# Patient Record
Sex: Female | Born: 1960
Health system: Southern US, Community
[De-identification: ages and names within clinical notes are randomized; demographics above are authoritative.]

## PROBLEM LIST (undated history)

## (undated) DIAGNOSIS — E7800 Pure hypercholesterolemia, unspecified: Secondary | ICD-10-CM

## (undated) DIAGNOSIS — I10 Essential (primary) hypertension: Secondary | ICD-10-CM

## (undated) DIAGNOSIS — F419 Anxiety disorder, unspecified: Secondary | ICD-10-CM

## (undated) DIAGNOSIS — E039 Hypothyroidism, unspecified: Secondary | ICD-10-CM

## (undated) DIAGNOSIS — J45909 Unspecified asthma, uncomplicated: Secondary | ICD-10-CM

## (undated) DIAGNOSIS — D472 Monoclonal gammopathy: Secondary | ICD-10-CM

## (undated) DIAGNOSIS — G4730 Sleep apnea, unspecified: Secondary | ICD-10-CM

## (undated) DIAGNOSIS — R112 Nausea with vomiting, unspecified: Secondary | ICD-10-CM

## (undated) DIAGNOSIS — C8800 Waldenstrom macroglobulinemia not having achieved remission: Secondary | ICD-10-CM

## (undated) DIAGNOSIS — Z9889 Other specified postprocedural states: Secondary | ICD-10-CM

## (undated) DIAGNOSIS — R002 Palpitations: Secondary | ICD-10-CM

## (undated) DIAGNOSIS — E669 Obesity, unspecified: Secondary | ICD-10-CM

## (undated) DIAGNOSIS — E119 Type 2 diabetes mellitus without complications: Secondary | ICD-10-CM

## (undated) DIAGNOSIS — C9000 Multiple myeloma not having achieved remission: Secondary | ICD-10-CM

## (undated) DIAGNOSIS — G629 Polyneuropathy, unspecified: Secondary | ICD-10-CM

## (undated) DIAGNOSIS — R300 Dysuria: Secondary | ICD-10-CM

## (undated) DIAGNOSIS — F32A Depression, unspecified: Secondary | ICD-10-CM

## (undated) DIAGNOSIS — R239 Unspecified skin changes: Secondary | ICD-10-CM

## (undated) DIAGNOSIS — K8020 Calculus of gallbladder without cholecystitis without obstruction: Secondary | ICD-10-CM

## (undated) DIAGNOSIS — D649 Anemia, unspecified: Secondary | ICD-10-CM

## (undated) DIAGNOSIS — E8809 Other disorders of plasma-protein metabolism, not elsewhere classified: Secondary | ICD-10-CM

## (undated) DIAGNOSIS — C801 Malignant (primary) neoplasm, unspecified: Secondary | ICD-10-CM

## (undated) DIAGNOSIS — K219 Gastro-esophageal reflux disease without esophagitis: Secondary | ICD-10-CM

## (undated) DIAGNOSIS — F329 Major depressive disorder, single episode, unspecified: Secondary | ICD-10-CM

## (undated) HISTORY — DX: Unspecified skin changes: R23.9

## (undated) HISTORY — PX: CHOLECYSTECTOMY: SHX55

## (undated) HISTORY — DX: Anemia, unspecified: D64.9

## (undated) HISTORY — DX: Obesity, unspecified: E66.9

## (undated) HISTORY — DX: Palpitations: R00.2

## (undated) HISTORY — PX: BONE MARROW BIOPSY: SHX1253

## (undated) HISTORY — DX: Monoclonal gammopathy: D47.2

## (undated) HISTORY — DX: Multiple myeloma not having achieved remission: C90.00

## (undated) HISTORY — DX: Polyneuropathy, unspecified: G62.9

## (undated) HISTORY — DX: Calculus of gallbladder without cholecystitis without obstruction: K80.20

## (undated) HISTORY — DX: Pure hypercholesterolemia, unspecified: E78.00

## (undated) HISTORY — DX: Type 2 diabetes mellitus without complications: E11.9

## (undated) HISTORY — DX: Other disorders of plasma-protein metabolism, not elsewhere classified: E88.09

## (undated) HISTORY — PX: OTHER SURGICAL HISTORY: SHX169

## (undated) HISTORY — DX: Waldenstrom macroglobulinemia not having achieved remission: C88.00

## (undated) HISTORY — PX: UTERINE FIBROID EMBOLIZATION: SHX825

## (undated) HISTORY — DX: Polyneuropathy, unspecified: D47.2

## (undated) HISTORY — DX: Gastro-esophageal reflux disease without esophagitis: K21.9

## (undated) HISTORY — DX: Dysuria: R30.0

## (undated) HISTORY — PX: KNEE ARTHROSCOPY W/ MENISCAL REPAIR: SHX1877

## (undated) HISTORY — DX: Waldenstrom macroglobulinemia: C88.0

---

## 1995-01-09 HISTORY — PX: LAPAROSCOPIC CHOLECYSTECTOMY: SUR755

## 1996-01-09 HISTORY — PX: FOOT SURGERY: SHX648

## 1998-09-28 ENCOUNTER — Other Ambulatory Visit: Admission: RE | Admit: 1998-09-28 | Discharge: 1998-09-28 | Payer: Self-pay | Admitting: Obstetrics and Gynecology

## 1999-10-18 ENCOUNTER — Other Ambulatory Visit: Admission: RE | Admit: 1999-10-18 | Discharge: 1999-10-18 | Payer: Self-pay | Admitting: Obstetrics and Gynecology

## 2000-12-11 ENCOUNTER — Other Ambulatory Visit: Admission: RE | Admit: 2000-12-11 | Discharge: 2000-12-11 | Payer: Self-pay | Admitting: Obstetrics and Gynecology

## 2001-12-12 ENCOUNTER — Other Ambulatory Visit: Admission: RE | Admit: 2001-12-12 | Discharge: 2001-12-12 | Payer: Self-pay | Admitting: Obstetrics and Gynecology

## 2002-01-08 HISTORY — PX: NASAL SINUS SURGERY: SHX719

## 2003-01-15 ENCOUNTER — Other Ambulatory Visit: Admission: RE | Admit: 2003-01-15 | Discharge: 2003-01-15 | Payer: Self-pay | Admitting: Obstetrics and Gynecology

## 2004-02-08 ENCOUNTER — Encounter: Admission: RE | Admit: 2004-02-08 | Discharge: 2004-02-08 | Payer: Self-pay

## 2004-02-27 ENCOUNTER — Encounter: Admission: RE | Admit: 2004-02-27 | Discharge: 2004-02-27 | Payer: Self-pay | Admitting: Unknown Physician Specialty

## 2004-03-08 HISTORY — PX: LUMBAR DISC SURGERY: SHX700

## 2004-03-08 HISTORY — PX: BACK SURGERY: SHX140

## 2004-03-31 ENCOUNTER — Ambulatory Visit (HOSPITAL_COMMUNITY): Admission: RE | Admit: 2004-03-31 | Discharge: 2004-04-01 | Payer: Self-pay | Admitting: Neurosurgery

## 2005-01-23 ENCOUNTER — Other Ambulatory Visit: Admission: RE | Admit: 2005-01-23 | Discharge: 2005-01-23 | Payer: Self-pay | Admitting: Obstetrics & Gynecology

## 2005-02-19 ENCOUNTER — Ambulatory Visit (HOSPITAL_BASED_OUTPATIENT_CLINIC_OR_DEPARTMENT_OTHER): Admission: RE | Admit: 2005-02-19 | Discharge: 2005-02-19 | Payer: Self-pay | Admitting: Unknown Physician Specialty

## 2005-02-25 ENCOUNTER — Ambulatory Visit: Payer: Self-pay | Admitting: Internal Medicine

## 2006-07-01 ENCOUNTER — Other Ambulatory Visit: Admission: RE | Admit: 2006-07-01 | Discharge: 2006-07-01 | Payer: Self-pay | Admitting: Obstetrics & Gynecology

## 2006-08-14 ENCOUNTER — Encounter: Admission: RE | Admit: 2006-08-14 | Discharge: 2006-08-14 | Payer: Self-pay | Admitting: Family Medicine

## 2007-01-09 HISTORY — PX: UTERINE FIBROID EMBOLIZATION: SHX825

## 2007-07-03 ENCOUNTER — Other Ambulatory Visit: Admission: RE | Admit: 2007-07-03 | Discharge: 2007-07-03 | Payer: Self-pay | Admitting: Obstetrics & Gynecology

## 2007-09-09 HISTORY — PX: ABLATION: SHX5711

## 2007-09-29 ENCOUNTER — Ambulatory Visit (HOSPITAL_BASED_OUTPATIENT_CLINIC_OR_DEPARTMENT_OTHER): Admission: RE | Admit: 2007-09-29 | Discharge: 2007-09-29 | Payer: Self-pay | Admitting: Internal Medicine

## 2007-09-29 ENCOUNTER — Encounter: Payer: Self-pay | Admitting: Obstetrics & Gynecology

## 2008-08-05 ENCOUNTER — Encounter: Admission: RE | Admit: 2008-08-05 | Discharge: 2008-08-05 | Payer: Self-pay | Admitting: Gastroenterology

## 2008-09-10 ENCOUNTER — Ambulatory Visit: Payer: Self-pay | Admitting: Internal Medicine

## 2008-09-14 LAB — CBC WITH DIFFERENTIAL/PLATELET
Basophils Absolute: 0 10*3/uL (ref 0.0–0.1)
HCT: 36 % (ref 34.8–46.6)
HGB: 12.7 g/dL (ref 11.6–15.9)
MONO#: 0.4 10*3/uL (ref 0.1–0.9)
NEUT#: 4.9 10*3/uL (ref 1.5–6.5)
NEUT%: 73.3 % (ref 38.4–76.8)
WBC: 6.7 10*3/uL (ref 3.9–10.3)
lymph#: 1.3 10*3/uL (ref 0.9–3.3)

## 2008-09-16 LAB — IMMUNOFIXATION ELECTROPHORESIS
IgA: 132 mg/dL (ref 68–378)
IgG (Immunoglobin G), Serum: 617 mg/dL — ABNORMAL LOW (ref 694–1618)

## 2008-09-16 LAB — KAPPA/LAMBDA LIGHT CHAINS: Kappa free light chain: <0.57 mg/dL (ref 0.33–1.94)

## 2008-09-16 LAB — COMPREHENSIVE METABOLIC PANEL
ALT: 80 U/L — ABNORMAL HIGH (ref 0–35)
Albumin: 4.1 g/dL (ref 3.5–5.2)
BUN: 9 mg/dL (ref 6–23)
CO2: 20 mEq/L (ref 19–32)
Calcium: 8.9 mg/dL (ref 8.4–10.5)
Chloride: 101 mEq/L (ref 96–112)
Creatinine, Ser: 0.63 mg/dL (ref 0.40–1.20)

## 2008-09-16 LAB — LACTATE DEHYDROGENASE: LDH: 165 U/L (ref 94–250)

## 2008-09-20 LAB — UIFE/LIGHT CHAINS/TP QN, 24-HR UR
Albumin, U: DETECTED
Free Lambda Lt Chains,Ur: <0.05 mg/dL (ref 0.08–1.01)
Free Lt Chn Excr Rate: 5.53 mg/d
Time: 24 hours
Volume, Urine: 4610 mL

## 2008-09-27 ENCOUNTER — Ambulatory Visit (HOSPITAL_COMMUNITY): Admission: RE | Admit: 2008-09-27 | Discharge: 2008-09-27 | Payer: Self-pay | Admitting: Internal Medicine

## 2008-10-05 ENCOUNTER — Ambulatory Visit (HOSPITAL_COMMUNITY): Admission: RE | Admit: 2008-10-05 | Discharge: 2008-10-05 | Payer: Self-pay | Admitting: Internal Medicine

## 2008-10-05 ENCOUNTER — Encounter (INDEPENDENT_AMBULATORY_CARE_PROVIDER_SITE_OTHER): Payer: Self-pay | Admitting: Interventional Radiology

## 2009-01-05 ENCOUNTER — Ambulatory Visit: Payer: Self-pay | Admitting: Internal Medicine

## 2009-01-10 LAB — CBC WITH DIFFERENTIAL/PLATELET
Basophils Absolute: 0 10*3/uL (ref 0.0–0.1)
Eosinophils Absolute: 0.1 10*3/uL (ref 0.0–0.5)
HCT: 36.5 % (ref 34.8–46.6)
LYMPH%: 21.1 % (ref 14.0–49.7)
MCV: 94.6 fL (ref 79.5–101.0)
MONO#: 0.5 10*3/uL (ref 0.1–0.9)
MONO%: 6.5 % (ref 0.0–14.0)
NEUT#: 4.8 10*3/uL (ref 1.5–6.5)
NEUT%: 69.9 % (ref 38.4–76.8)
Platelets: 230 10*3/uL (ref 145–400)
RBC: 3.85 10*6/uL (ref 3.70–5.45)

## 2009-01-10 LAB — COMPREHENSIVE METABOLIC PANEL
Alkaline Phosphatase: 85 U/L (ref 39–117)
BUN: 8 mg/dL (ref 6–23)
CO2: 30 mEq/L (ref 19–32)
Creatinine, Ser: 0.71 mg/dL (ref 0.40–1.20)
Glucose, Bld: 125 mg/dL — ABNORMAL HIGH (ref 70–99)
Sodium: 138 mEq/L (ref 135–145)
Total Bilirubin: 0.7 mg/dL (ref 0.3–1.2)

## 2009-01-10 LAB — LACTATE DEHYDROGENASE: LDH: 128 U/L (ref 94–250)

## 2009-01-11 LAB — IGG, IGA, IGM
IgA: 128 mg/dL (ref 68–378)
IgG (Immunoglobin G), Serum: 639 mg/dL — ABNORMAL LOW (ref 694–1618)

## 2009-01-11 LAB — KAPPA/LAMBDA LIGHT CHAINS: Kappa free light chain: 0.97 mg/dL (ref 0.33–1.94)

## 2009-01-11 LAB — BETA 2 MICROGLOBULIN, SERUM: Beta-2 Microglobulin: 1.29 mg/L (ref 1.01–1.73)

## 2009-03-03 ENCOUNTER — Ambulatory Visit (HOSPITAL_BASED_OUTPATIENT_CLINIC_OR_DEPARTMENT_OTHER): Admission: RE | Admit: 2009-03-03 | Discharge: 2009-03-03 | Payer: Self-pay | Admitting: Orthopedic Surgery

## 2009-04-04 ENCOUNTER — Ambulatory Visit: Payer: Self-pay | Admitting: Internal Medicine

## 2009-04-06 LAB — CBC WITH DIFFERENTIAL/PLATELET
Basophils Absolute: 0 10*3/uL (ref 0.0–0.1)
EOS%: 1.6 % (ref 0.0–7.0)
Eosinophils Absolute: 0.1 10*3/uL (ref 0.0–0.5)
HGB: 13.4 g/dL (ref 11.6–15.9)
MCH: 33 pg (ref 25.1–34.0)
MONO#: 0.6 10*3/uL (ref 0.1–0.9)
NEUT#: 6.1 10*3/uL (ref 1.5–6.5)
RDW: 13.3 % (ref 11.2–14.5)
WBC: 8.3 10*3/uL (ref 3.9–10.3)
lymph#: 1.5 10*3/uL (ref 0.9–3.3)

## 2009-04-08 LAB — IGG, IGA, IGM: IgA: 149 mg/dL (ref 68–378)

## 2009-04-08 LAB — COMPREHENSIVE METABOLIC PANEL
ALT: 50 U/L — ABNORMAL HIGH (ref 0–35)
AST: 48 U/L — ABNORMAL HIGH (ref 0–37)
Albumin: 4.4 g/dL (ref 3.5–5.2)
BUN: 11 mg/dL (ref 6–23)
CO2: 28 mEq/L (ref 19–32)
Calcium: 9.7 mg/dL (ref 8.4–10.5)
Chloride: 99 mEq/L (ref 96–112)
Potassium: 4.1 mEq/L (ref 3.5–5.3)

## 2009-04-08 LAB — KAPPA/LAMBDA LIGHT CHAINS: Lambda Free Lght Chn: 80.2 mg/dL — ABNORMAL HIGH (ref 0.57–2.63)

## 2009-07-04 ENCOUNTER — Ambulatory Visit: Payer: Self-pay | Admitting: Internal Medicine

## 2009-07-06 LAB — CBC WITH DIFFERENTIAL/PLATELET
BASO%: 0.6 % (ref 0.0–2.0)
Eosinophils Absolute: 0.1 10*3/uL (ref 0.0–0.5)
HCT: 36.3 % (ref 34.8–46.6)
MCHC: 34.8 g/dL (ref 31.5–36.0)
MONO#: 0.7 10*3/uL (ref 0.1–0.9)
NEUT#: 5.8 10*3/uL (ref 1.5–6.5)
Platelets: 235 10*3/uL (ref 145–400)
RBC: 3.91 10*6/uL (ref 3.70–5.45)
WBC: 8.4 10*3/uL (ref 3.9–10.3)
lymph#: 1.8 10*3/uL (ref 0.9–3.3)

## 2009-07-06 LAB — COMPREHENSIVE METABOLIC PANEL
ALT: 48 U/L — ABNORMAL HIGH (ref 0–35)
Albumin: 3.9 g/dL (ref 3.5–5.2)
CO2: 27 mEq/L (ref 19–32)
Calcium: 9.2 mg/dL (ref 8.4–10.5)
Chloride: 101 mEq/L (ref 96–112)
Glucose, Bld: 76 mg/dL (ref 70–99)
Sodium: 136 mEq/L (ref 135–145)
Total Protein: 7.2 g/dL (ref 6.0–8.3)

## 2009-07-06 LAB — LACTATE DEHYDROGENASE: LDH: 131 U/L (ref 94–250)

## 2009-07-08 LAB — KAPPA/LAMBDA LIGHT CHAINS: Lambda Free Lght Chn: 46.3 mg/dL — ABNORMAL HIGH (ref 0.57–2.63)

## 2009-07-08 LAB — IGG, IGA, IGM: IgG (Immunoglobin G), Serum: 654 mg/dL — ABNORMAL LOW (ref 694–1618)

## 2009-07-13 LAB — UIFE/LIGHT CHAINS/TP QN, 24-HR UR
Albumin, U: DETECTED
Alpha 1, Urine: NOT DETECTED
Free Lambda Excretion/Day: 2.25 mg/d
Free Lambda Lt Chains,Ur: 0.05 mg/dL — ABNORMAL LOW (ref 0.08–1.01)
Total Protein, Urine-Ur/day: 27 mg/d (ref 10–140)
Volume, Urine: 4500 mL

## 2010-01-04 ENCOUNTER — Ambulatory Visit: Payer: Self-pay | Admitting: Internal Medicine

## 2010-03-22 ENCOUNTER — Encounter (HOSPITAL_BASED_OUTPATIENT_CLINIC_OR_DEPARTMENT_OTHER): Payer: BC Managed Care – PPO | Admitting: Internal Medicine

## 2010-03-22 ENCOUNTER — Other Ambulatory Visit: Payer: Self-pay | Admitting: Internal Medicine

## 2010-03-22 DIAGNOSIS — C880 Waldenstrom macroglobulinemia: Secondary | ICD-10-CM

## 2010-03-22 DIAGNOSIS — D472 Monoclonal gammopathy: Secondary | ICD-10-CM

## 2010-03-22 LAB — COMPREHENSIVE METABOLIC PANEL
Albumin: 3.8 g/dL (ref 3.5–5.2)
BUN: 8 mg/dL (ref 6–23)
CO2: 28 mEq/L (ref 19–32)
Calcium: 9.1 mg/dL (ref 8.4–10.5)
Chloride: 101 mEq/L (ref 96–112)
Glucose, Bld: 100 mg/dL — ABNORMAL HIGH (ref 70–99)
Potassium: 3.6 mEq/L (ref 3.5–5.3)

## 2010-03-22 LAB — CBC WITH DIFFERENTIAL/PLATELET
Basophils Absolute: 0 10*3/uL (ref 0.0–0.1)
Eosinophils Absolute: 0.1 10*3/uL (ref 0.0–0.5)
HGB: 12.7 g/dL (ref 11.6–15.9)
NEUT#: 4.7 10*3/uL (ref 1.5–6.5)
RDW: 12.6 % (ref 11.2–14.5)
lymph#: 1.4 10*3/uL (ref 0.9–3.3)

## 2010-03-24 LAB — IGG, IGA, IGM
IgA: 128 mg/dL (ref 68–378)
IgG (Immunoglobin G), Serum: 662 mg/dL — ABNORMAL LOW (ref 694–1618)
IgM, Serum: 692 mg/dL — ABNORMAL HIGH (ref 60–263)

## 2010-03-24 LAB — KAPPA/LAMBDA LIGHT CHAINS: Kappa free light chain: <0.6 mg/dL (ref 0.33–1.94)

## 2010-03-29 ENCOUNTER — Encounter (HOSPITAL_BASED_OUTPATIENT_CLINIC_OR_DEPARTMENT_OTHER): Payer: BC Managed Care – PPO | Admitting: Internal Medicine

## 2010-03-29 DIAGNOSIS — C880 Waldenstrom macroglobulinemia: Secondary | ICD-10-CM

## 2010-03-29 LAB — POCT PREGNANCY, URINE: Preg Test, Ur: NEGATIVE

## 2010-03-29 LAB — POCT I-STAT 4, (NA,K, GLUC, HGB,HCT)
Glucose, Bld: 108 mg/dL — ABNORMAL HIGH (ref 70–99)
HCT: 36 % (ref 36.0–46.0)
Hemoglobin: 12.2 g/dL (ref 12.0–15.0)
Sodium: 138 mEq/L (ref 135–145)

## 2010-04-14 LAB — PROTIME-INR: Prothrombin Time: 12 seconds (ref 11.6–15.2)

## 2010-04-14 LAB — BASIC METABOLIC PANEL
BUN: 8 mg/dL (ref 6–23)
Calcium: 9.1 mg/dL (ref 8.4–10.5)
Creatinine, Ser: 0.7 mg/dL (ref 0.4–1.2)
GFR calc non Af Amer: >60 mL/min (ref >60)
Glucose, Bld: 131 mg/dL — ABNORMAL HIGH (ref 70–99)

## 2010-04-14 LAB — CBC
Hemoglobin: 12.6 g/dL (ref 12.0–15.0)
MCHC: 35.1 g/dL (ref 30.0–36.0)
MCV: 92.4 fL (ref 78.0–100.0)
RBC: 3.9 MIL/uL (ref 3.87–5.11)

## 2010-04-14 LAB — CHROMOSOME ANALYSIS, BONE MARROW

## 2010-05-23 NOTE — Op Note (Signed)
NAMEKYNEDI, PROFITT              ACCOUNT NO.:  [id number]   MEDICAL RECORD NO.:  [id number]          PATIENT TYPE:  AMB   LOCATION:  [facility]                         FACILITY:  [facility]   PHYSICIAN:  M. Leda Quail, MD  DATE OF BIRTH:  Jan 19, 1960   DATE OF PROCEDURE:  09/29/2007  DATE OF DISCHARGE:                               OPERATIVE REPORT   PREOPERATIVE DIAGNOSES:  95. A 50 year old G0 single white female with menorrhagia.  2. Hypertension.  3. Asthma.  4. History of depression.  5. Seasonal allergies.  6. Obesity.   POSTOPERATIVE DIAGNOSES:  16. A 50 year old G0 single white female with menorrhagia.  2. Hypertension.  3. Asthma.  4. History of depression.  5. Seasonal allergies.  6. Obesity.  7. Endometrial polyps.   PROCEDURE:  Endometrial biopsy, polypectomy x2 and hydrothermal  ablation.   SURGEON:  M. Leda Quail, MD   ASSISTANT:  OR staff.   ANESTHESIA:  MAC.   FINDINGS:  Three endometrial polyps noted with the hysteroscope.   SPECIMENS:  There were two, endometrial biopsy and endometrial polyps  sent separately.   DISPOSITION OF SPECIMEN:  To Pathology.   ESTIMATED BLOOD LOSS:  Minimal.   FLUIDS:  500 mL of Lactated Ringers.   URINE OUTPUT:  25 mL drained with in and out catheterization at the  beginning of the procedure.  The patient voided right before coming back  to the operating room.   COMPLICATIONS:  None.   INDICATIONS:  Ms. Brenda Conner is a very nice 50 year old G 0 single white  female who has experienced worsening menorrhagia over the last several  years.  We have talked about this in the past.  She did have back  surgery about 2 years ago and at that time wanted to fully recover from  her back before proceeding with any other sort of evaluation and  treatment.  Menorrhagia was manageable.  It was just very bothersome  that she has cycles for 7 to 8 days and she has a menstrual about every  24 days.  She has to wear pads and tampons because  of the amount of  flow.  She has had some trouble with anemia due to the bleeding.  This  has been mild and managed with iron.  The patient was initially  evaluated after deciding to proceed with some sort of further management  via an ultrasound.  Her uterus appeared fairly normal except for small  intramural 11 x 7-mm fibroid.  Her endometrium was somewhat thickened  and she did have two small polyps that were noted.  She was pretreated  with Depo-Lupron and a repeat ultrasound before the procedure showed the  endometrium about 5 mm.  Felt at this point probably the polyps were  small and it was appropriate to go ahead and proceed.  The patient has  undergone explanation of risks and benefits and is here today ready to  proceed.   PROCEDURE:  The patient is taken to the operating room.  She is placed  in supine position.  Anesthesia administered by anesthesia without  difficulty.  She has  running IV in place.  Legs positioned in [facility]  stirrups in the low lithotomy position.  SCDs were placed bilaterally.  Legs were then lifted to the high lithotomy position.  Perineum, inner  thighs and vagina were prepped with Betadine in a normal sterile  fashion.  Bivalve speculum was placed in vagina.  The anterior lip of  the cervix was grasped with a single-tooth tenaculum.  The uterus sounds  to 9 cm.  Using [facility] dilators, the cervix was dilated up to a #23.  Paracervical block of 1% lidocaine mixed 1:1 with epinephrine (1:100,000  units) instilled in the cervix.  10 mL total are placed for the  paracervical block.  Then 2.9-mm hysteroscope attached to the HT  apparatus is obtained.  This was slowly advanced through the  endocervical canal.  Two polyps on stalks were easily noted.  They were  right in the way of the hysteroscope and I felt that they could obstruct  the ablation.  Therefore hysteroscope was removed and a polyp forcep was  obtained.  With several passes of the polyp forceps, the  two polyps were  removed.  At this point, the hysteroscope could easily pass back into  the cervical canal and the endometrial cavity was well visualized.  Tubal ostia were noted and photo documentation was made.  The scope was  placed in the lower uterine portion of the endometrial cavity.  Once  proper placement was felt to be present, the heating cycle was begun.  After the heating cycle was complete, the fluid was at 81 degrees  Celsius from the beginning of the ablation cycle.  The reservoir was at  75 mmHg.  A 2 minute ablation cycle was performed.  The reservoir was  completely stable during the procedure.  As the procedure was completed  and the cooling cycle finished, the very small polyp on the posterior  aspect of the uterine wall was visualized.  Once the cool cycle was  complete, the hysteroscope was removed.  The polyp forcep was passed one  more time and this polyp was grasped and removed.  It was sent to  pathology with the two additional polyps.  The tenaculum was removed  from the cervix.  No bleeding was noted from the tenaculum sites.  The  speculum was removed from the vagina.  The patient tolerated the  procedure well.  Sponge, lap, needle and instrument counts were correct  x2.  She was awakened from anesthesia, Betadine was washed off her skin  and she was taken to the recovery room in stable condition.      Lum Keas, MD  Electronically Signed     MSM/MEDQ  D:  09/29/2007  T:  09/30/2007  Job:  [id number]

## 2010-05-26 NOTE — Procedures (Signed)
Brenda Conner, [name]:  [id number]   MEDICAL RECORD NO.:  [id number]          PATIENT TYPE:  OUT   LOCATION:  SLEEP CENTER                 FACILITY:  [facility]   PHYSICIAN:  Clinton D. Maple Hudson, M.D. DATE OF BIRTH:  12-Nov-1960   DATE OF STUDY:  02/19/2005                              NOCTURNAL POLYSOMNOGRAM   REFERRING PHYSICIAN:  Dr. Theadora Rama.   DATE OF STUDY:  February 19, 2005.   INDICATION FOR STUDY:  Hypersomnia with sleep apnea.   EPWORTH SLEEPINESS SCORE:  9/24.   BMI:  39.   WEIGHT:  259 pounds.   HOME MEDICATIONS:  Synthroid, Flonase, Advair, Zoloft, Allegra, Zocor,  verapamil.   SLEEP ARCHITECTURE:  Total sleep time 309 minutes with sleep efficiency 77%.  Stage I was 5%, stage II 67%, stages III and IV 2%, REM 26% of total sleep  time. Sleep latency 60 minutes, REM latency 170 minutes, awake after sleep  onset 30 minutes, arousal index 17. No bedtime medication was taken.   RESPIRATORY DATA:  Split study protocol: Apnea/hypopnea index (AHI, RDI)  31.8 obstructive events per hour indicating moderate obstructive sleep  apnea/hypopnea syndrome before C-PAP. This included 18 obstructive apneas  and 49 hypopneas before C-PAP. Most sleep and therefore most events were  reported while supine. REM AHI 3.8 per hour. C-PAP titration by split  protocol to 14 CWP, AHI 0 per hour. A medium ResMed full-face mask was used  with heated humidifier.   OXYGEN DATA:  Moderate to loud snoring with oxygen desaturation to a nadir  of 78% before C-PAP. After C-PAP control, oxygen saturation held 96% on room  air.   CARDIAC DATA:  Normal sinus rhythm.   MOVEMENT/PARASOMNIA:  Frequent limb jerks but few of these caused any sleep  disturbance. Bathroom x1.   IMPRESSION/RECOMMENDATIONS:  1.  Moderately severe obstructive sleep apnea/hypopnea syndrome, AHI 31.8      per hour with most sleep and events while supine, moderate snoring      associated with oxygen  desaturation to a nadir of 78%.  2.  Successful C-PAP titration to 14 CWP, AHI 0 per hour. A medium ResMed      full-face mask was used with heated humidifier.      Clinton D. Maple Hudson, M.D.  Diplomate, Biomedical engineer of Sleep Medicine  Electronically Signed     CDY/MEDQ  D:  02/25/2005 10:00:22  T:  02/25/2005 21:46:12  Job:  [id number]

## 2010-05-26 NOTE — H&P (Signed)
NAMEBRIDGITTE, FELICETTI NO.:  [id number]   MEDICAL RECORD NO.:  [id number]          PATIENT TYPE:  OIB   LOCATION:  NA                           FACILITY:  [facility]   PHYSICIAN:  Hilda Lias, M.D.   DATE OF BIRTH:  1960-06-19   DATE OF ADMISSION:  DATE OF DISCHARGE:                                HISTORY & PHYSICAL   Brenda Conner is a lady who had been complaining of back pain with radiation to  the right leg with a numbness and tingling sensation in the right foot.  This problem has been going on for several weeks.  The patient was found by  x-ray that she has a large herniated disk at the L4-L5.  She has  conservative treatment including epidural injection without any improvement.  Because of persistent pain and the weakness, she wants to proceed with  surgery.   PAST MEDICAL HISTORY:  1.  Cholecystectomy.  2.  Surgery of the nose.  3.  Surgery of the foot.   ALLERGIES:  1.  AMOXICILLIN.  2.  CODEINE.   FAMILY HISTORY:  Unremarkable.   SOCIAL HISTORY:  Negative.   REVIEW OF SYSTEMS:  Positive for high blood pressure, high cholesterol,  asthma, thyroid disease, obesity.   PHYSICAL EXAMINATION:  MUSCULOSKELETAL:  The patient came to my office  limping from the right leg.  EARS/NOSE/THROAT:  Normal.  NECK:  Normal.  LUNGS:  Clear.  HEART:  Sounds normal.  ABDOMEN:  There is a scar from previous surgery.  The rest is normal.  _________ is normal.   Normal mental status ________.  Strength show weakness of her dorsiflexion  on the right foot with pain 2/5.  Plantar flexion is 4/5.  The left side is  completely normal.  Sensation normal on the top of the right foot.   The MRI showed that she has a herniated disk at the level of L4-L5,  _________ to the right with some mild stenosis below L3-L4 and degenerative  disk disease at the level of L5-S1.   IMPRESSION:  1.  Right L4-L5 herniated disk with a 2/5 weakness, especially of the right      foot.  2.   Multiple level lumbar degenerative disk disease.   RECOMMENDATIONS:  The patient is being admitted for surgery.  The procedure  will be a right L4-L5 diskectomy.  The patient was advised of the risks  including the possibility of CSF leak, recurrence, need of further surgery  that require fusion, and all the risks associated with her obesity.      EB/MEDQ  D:  03/31/2004  T:  03/31/2004  Job:  [id number]

## 2010-05-30 NOTE — Op Note (Signed)
NAMEALMAS, RAKE NO.:  [id number]   MEDICAL RECORD NO.:  [id number]          PATIENT TYPE:  OIB   LOCATION:  2899                         FACILITY:  [facility]   PHYSICIAN:  Hilda Lias, M.D.   DATE OF BIRTH:  [birth year]   DATE OF PROCEDURE:  03/31/2004  DATE OF DISCHARGE:                                 OPERATIVE REPORT   PREOPERATIVE DIAGNOSIS:  Right L4-L5 herniated disc, degenerative disc  disease L3-L4, L4-L5, L5-S1, obesity.   POSTOPERATIVE DIAGNOSIS:  Right L4-L5 herniated disc, degenerative disc  disease L3-L4, L4-L5, L5-S1, obesity.   PROCEDURE:  Right L4-L5 discectomy with removal of large free fragment,  decompression of the L4 and L5 nerve root, microscopy.   SURGEON:  Hilda Lias, M.D.   ASSISTANT:  Stefani Dama, M.D.   CLINICAL HISTORY:  The patient is being taken to surgery because of right  leg pain associated with weakened dorsiflexion of the right foot at 2/5.  She has failed conservative treatments including epidural injection.  The  risks were explained to the history and physical.  The patient is overweight  for her height.   PROCEDURE:  The patient was taken to the OR and shed was positioned in a  prone matter.  The back was prepped with Betadine.  It was difficult to  palpate the spinous process.  Then, a needle was inserted and we were able  to see that we were at the level of L4-L5.  From then on, the midline  incision was made through the skin through thick adipost tissue.  The muscle  was retracted to the right side.  We identified L4-L5 and laminotomy using  the microscope and the drill was made.  X-rays showed that, indeed, we were  at the level of 4-5.  What we found during the procedure, the patient was  quite stenotic with foraminal stenosis.  The yellow ligament was excised.  With foraminotomy to decompress the L5 nerve root.  The L5 nerve root was  completely tight and we were able to move.  Then, we retracted the  thecal  sac and, indeed, there were 3-4 free fragments right at the take off of the  L5 nerve root.  Once the fragments were removed, the L5 nerve root was free  in the canal.  We investigated the disc space which was flat.  There was no  needed to do the discectomy.  From then on, the area was irrigated.  A  Valsalva maneuver was negative.  The wound was closed with Vicryl and Steri-  Strips.  Fentanyl and Depo-Medrol were left in the epidural space.      EB/MEDQ  D:  03/31/2004  T:  03/31/2004  Job:  [id number]

## 2010-09-15 ENCOUNTER — Encounter (HOSPITAL_BASED_OUTPATIENT_CLINIC_OR_DEPARTMENT_OTHER): Payer: BC Managed Care – PPO | Admitting: Internal Medicine

## 2010-09-15 ENCOUNTER — Other Ambulatory Visit: Payer: Self-pay | Admitting: Internal Medicine

## 2010-09-15 DIAGNOSIS — C880 Waldenstrom macroglobulinemia: Secondary | ICD-10-CM

## 2010-09-15 LAB — CBC WITH DIFFERENTIAL/PLATELET
BASO%: 0.3 % (ref 0.0–2.0)
EOS%: 1.5 % (ref 0.0–7.0)
LYMPH%: 23.3 % (ref 14.0–49.7)
MCH: 30.9 pg (ref 25.1–34.0)
MCHC: 34.5 g/dL (ref 31.5–36.0)
MCV: 89.5 fL (ref 79.5–101.0)
MONO#: 0.5 10*3/uL (ref 0.1–0.9)
MONO%: 6.8 % (ref 0.0–14.0)
NEUT%: 68.1 % (ref 38.4–76.8)
Platelets: 244 10*3/uL (ref 145–400)
RBC: 3.92 10*6/uL (ref 3.70–5.45)
WBC: 7.5 10*3/uL (ref 3.9–10.3)

## 2010-09-15 LAB — COMPREHENSIVE METABOLIC PANEL
ALT: 25 U/L (ref 0–35)
Alkaline Phosphatase: 96 U/L (ref 39–117)
Creatinine, Ser: 0.6 mg/dL (ref 0.50–1.10)
Sodium: 137 mEq/L (ref 135–145)
Total Bilirubin: 0.3 mg/dL (ref 0.3–1.2)
Total Protein: 7.3 g/dL (ref 6.0–8.3)

## 2010-09-18 LAB — VISCOSITY, SERUM: Viscosity, Serum: 1.6 rel to H2O (ref 1.5–1.9)

## 2010-09-18 LAB — KAPPA/LAMBDA LIGHT CHAINS
Kappa free light chain: 0.2 mg/dL — ABNORMAL LOW (ref 0.33–1.94)
Lambda Free Lght Chn: 74.4 mg/dL — ABNORMAL HIGH (ref 0.57–2.63)

## 2010-09-18 LAB — IGG, IGA, IGM: IgM, Serum: 818 mg/dL — ABNORMAL HIGH (ref 52–322)

## 2010-09-18 LAB — BETA 2 MICROGLOBULIN, SERUM: Beta-2 Microglobulin: 1.54 mg/L (ref 1.01–1.73)

## 2010-09-27 ENCOUNTER — Encounter (HOSPITAL_BASED_OUTPATIENT_CLINIC_OR_DEPARTMENT_OTHER): Payer: BC Managed Care – PPO | Admitting: Internal Medicine

## 2010-10-09 LAB — BASIC METABOLIC PANEL
Calcium: 9.4
Creatinine, Ser: 0.79
GFR calc Af Amer: >60
GFR calc non Af Amer: >60
Sodium: 138

## 2010-10-09 LAB — URINALYSIS, ROUTINE W REFLEX MICROSCOPIC
Bilirubin Urine: NEGATIVE
Ketones, ur: NEGATIVE
Protein, ur: NEGATIVE
Urobilinogen, UA: 0.2

## 2010-10-09 LAB — CBC
Hemoglobin: 13.6
RBC: 4.44
RDW: 15.1

## 2010-10-09 LAB — PREGNANCY, URINE: Preg Test, Ur: NEGATIVE

## 2011-01-31 ENCOUNTER — Telehealth: Payer: Self-pay | Admitting: Internal Medicine

## 2011-01-31 NOTE — Telephone Encounter (Signed)
lmonvm adviisng the pt of her march 2013 appts °

## 2011-03-20 ENCOUNTER — Other Ambulatory Visit (HOSPITAL_BASED_OUTPATIENT_CLINIC_OR_DEPARTMENT_OTHER): Payer: BC Managed Care – PPO | Admitting: Lab

## 2011-03-20 DIAGNOSIS — C880 Waldenstrom macroglobulinemia: Secondary | ICD-10-CM

## 2011-03-20 LAB — COMPREHENSIVE METABOLIC PANEL
CO2: 26 mEq/L (ref 19–32)
Glucose, Bld: 121 mg/dL — ABNORMAL HIGH (ref 70–99)
Sodium: 137 mEq/L (ref 135–145)
Total Bilirubin: 0.3 mg/dL (ref 0.3–1.2)
Total Protein: 7.2 g/dL (ref 6.0–8.3)

## 2011-03-20 LAB — CBC WITH DIFFERENTIAL/PLATELET
Basophils Absolute: 0 10*3/uL (ref 0.0–0.1)
Eosinophils Absolute: 0.5 10*3/uL (ref 0.0–0.5)
HCT: 35.5 % (ref 34.8–46.6)
HGB: 12.1 g/dL (ref 11.6–15.9)
LYMPH%: 19.8 % (ref 14.0–49.7)
MONO#: 0.4 10*3/uL (ref 0.1–0.9)
NEUT#: 4.4 10*3/uL (ref 1.5–6.5)
NEUT%: 65.2 % (ref 38.4–76.8)
Platelets: 212 10*3/uL (ref 145–400)
RBC: 3.83 10*6/uL (ref 3.70–5.45)
WBC: 6.7 10*3/uL (ref 3.9–10.3)

## 2011-03-20 LAB — LACTATE DEHYDROGENASE: LDH: 177 U/L (ref 94–250)

## 2011-03-21 LAB — IGG, IGA, IGM: IgG (Immunoglobin G), Serum: 574 mg/dL — ABNORMAL LOW (ref 690–1700)

## 2011-03-21 LAB — VISCOSITY, SERUM: Viscosity, Serum: 1.7 rel to H2O (ref 1.5–1.9)

## 2011-03-21 LAB — KAPPA/LAMBDA LIGHT CHAINS: Kappa free light chain: 0.82 mg/dL (ref 0.33–1.94)

## 2011-03-27 ENCOUNTER — Ambulatory Visit (HOSPITAL_BASED_OUTPATIENT_CLINIC_OR_DEPARTMENT_OTHER): Payer: BC Managed Care – PPO | Admitting: Internal Medicine

## 2011-03-27 ENCOUNTER — Telehealth: Payer: Self-pay | Admitting: Internal Medicine

## 2011-03-27 ENCOUNTER — Encounter: Payer: Self-pay | Admitting: *Deleted

## 2011-03-27 VITALS — BP 136/74 | HR 62 | Temp 97.1°F | Ht 68.0 in | Wt 271.0 lb

## 2011-03-27 DIAGNOSIS — D472 Monoclonal gammopathy: Secondary | ICD-10-CM

## 2011-03-27 NOTE — Telephone Encounter (Signed)
gv pt appt schedule for sept °

## 2011-03-27 NOTE — Progress Notes (Signed)
      Brenda Conner [phone]   [phone]  OFFICE PROGRESS NOTE  Brenda Bumpers, MD, MD [address] [place] [address]  DIAGNOSIS: MGUS diagnosed in September 2010.  CURRENT THERAPY: Observation.  INTERVAL HISTORY: Brenda Conner 51 y.o. female returns to the clinic today for routine six-month followup visit. She has no complaints today. She denied having any significant weight loss or night sweats. No chest pain or shortness of breath. No bleeding issues or aching pain. She has repeat CBC, comprehensive metabolic panel, LDH and myeloma panel performed recently and she is here today for evaluation and discussion of her lab results.  ALLERGIES:  is allergic to amoxicillin and codeine.  MEDICATIONS:  Current Outpatient Prescriptions  Medication Sig Dispense Refill  . Fexofenadine HCl (ALLEGRA PO) Take by mouth daily as needed.        REVIEW OF SYSTEMS:  A comprehensive review of systems was negative.   PHYSICAL EXAMINATION: General appearance: alert, cooperative and fatigued Neck: no adenopathy Lymph nodes: Cervical, supraclavicular, and axillary nodes normal. Resp: clear to auscultation bilaterally Cardio: regular rate and rhythm, S1, S2 normal, no murmur, click, rub or gallop GI: soft, non-tender; bowel sounds normal; no masses,  no organomegaly Extremities: extremities normal, atraumatic, no cyanosis or edema  ECOG PERFORMANCE STATUS: 0 - Asymptomatic  Blood pressure 136/74, pulse 62, temperature 97.1 F (36.2 C), temperature source Oral, height 5\' 8"  (1.727 m), weight 271 lb (122.925 kg).  LABORATORY DATA: Lab Results  Component Value Date   WBC 6.7 03/20/2011   HGB 12.1 03/20/2011   HCT 35.5 03/20/2011   MCV 92.6 03/20/2011   PLT 212 03/20/2011      Chemistry      Component Value Date/Time   NA 137 03/20/2011 1509   K 3.4* 03/20/2011 1509   CL 102 03/20/2011 1509   CO2 26 03/20/2011 1509   BUN 7 03/20/2011 1509   CREATININE  0.63 03/20/2011 1509      Component Value Date/Time   CALCIUM 9.2 03/20/2011 1509   ALKPHOS 74 03/20/2011 1509   AST 24 03/20/2011 1509   ALT 26 03/20/2011 1509   BILITOT 0.3 03/20/2011 1509     Other lab results: Free kappa light chain 0.82, free lambda light chain 60.10, with a kappa/lambda ratio of 0.01. IgG was 574, IgA 101 and IgM 848. Beta-2 microglobulin 1.46.  RADIOGRAPHIC STUDIES: No results found.  ASSESSMENT: This is a very pleasant 51 years old white female with monoclonal gammopathy, currently on observation. The patient is doing fine with no evidence for disease progression based on the blood work done recently.   PLAN: I discussed the lab result with the patient and recommended for her continuous observation for now with repeat CBC, comprehensive metabolic panel, LDH and myeloma panel in 6 months. She was advised to call me immediately if she has any concerning symptoms in the interval.  All questions were answered. The patient knows to call the clinic with any problems, questions or concerns. We can certainly see the patient much sooner if necessary.

## 2011-09-26 ENCOUNTER — Other Ambulatory Visit (HOSPITAL_BASED_OUTPATIENT_CLINIC_OR_DEPARTMENT_OTHER): Payer: BC Managed Care – PPO | Admitting: Lab

## 2011-09-26 DIAGNOSIS — D472 Monoclonal gammopathy: Secondary | ICD-10-CM

## 2011-09-26 LAB — CBC WITH DIFFERENTIAL/PLATELET
Basophils Absolute: 0 10*3/uL (ref 0.0–0.1)
Eosinophils Absolute: 0 10*3/uL (ref 0.0–0.5)
HCT: 39 % (ref 34.8–46.6)
HGB: 13 g/dL (ref 11.6–15.9)
MONO#: 1 10*3/uL — ABNORMAL HIGH (ref 0.1–0.9)
NEUT%: 84.1 % — ABNORMAL HIGH (ref 38.4–76.8)
WBC: 16 10*3/uL — ABNORMAL HIGH (ref 3.9–10.3)
lymph#: 1.5 10*3/uL (ref 0.9–3.3)

## 2011-09-26 LAB — COMPREHENSIVE METABOLIC PANEL (CC13)
ALT: 22 U/L (ref 0–55)
AST: 13 U/L (ref 5–34)
BUN: 15 mg/dL (ref 7.0–26.0)
CO2: 27 mEq/L (ref 22–29)
Creatinine: 0.8 mg/dL (ref 0.6–1.1)
Total Bilirubin: 0.5 mg/dL (ref 0.20–1.20)

## 2011-09-26 LAB — LACTATE DEHYDROGENASE (CC13): LDH: 143 U/L (ref 125–220)

## 2011-09-28 LAB — SPEP & IFE WITH QIG
Beta 2: 14.3 % — ABNORMAL HIGH (ref 3.2–6.5)
Gamma Globulin: 7.8 % — ABNORMAL LOW (ref 11.1–18.8)
IgA: 107 mg/dL (ref 69–380)
IgM, Serum: 947 mg/dL — ABNORMAL HIGH (ref 52–322)

## 2011-09-28 LAB — KAPPA/LAMBDA LIGHT CHAINS
Kappa:Lambda Ratio: 0 — ABNORMAL LOW (ref 0.26–1.65)
Lambda Free Lght Chn: 60.9 mg/dL — ABNORMAL HIGH (ref 0.57–2.63)

## 2011-10-03 ENCOUNTER — Telehealth: Payer: Self-pay | Admitting: Internal Medicine

## 2011-10-03 ENCOUNTER — Ambulatory Visit (HOSPITAL_BASED_OUTPATIENT_CLINIC_OR_DEPARTMENT_OTHER): Payer: BC Managed Care – PPO | Admitting: Internal Medicine

## 2011-10-03 VITALS — BP 141/81 | HR 70 | Temp 97.1°F | Resp 20 | Ht 68.0 in | Wt 266.7 lb

## 2011-10-03 DIAGNOSIS — D472 Monoclonal gammopathy: Secondary | ICD-10-CM

## 2011-10-03 NOTE — Telephone Encounter (Signed)
gve the pt her march 2014 appt calendar °

## 2011-10-03 NOTE — Patient Instructions (Signed)
Your blood work showed no evidence for disease progression Followup in 6 months

## 2011-10-03 NOTE — Progress Notes (Signed)
[facility] Telephone:([phone]   [phone]  OFFICE PROGRESS NOTE  Brenda Bumpers, MD [address] [place] [address]  DIAGNOSIS: MGUS diagnosed in September 2010.   CURRENT THERAPY: Observation.  INTERVAL HISTORY: Brenda Conner 51 y.o. female returns to the clinic today for routine six-month followup visit. The patient is feeling fine today except for asthma flare secondary to seasonal allergy. She is currently on a steroid. She denied having any significant chest pain but continues to have shortness breath with exertion no cough or hemoptysis. She denied having any significant weight loss or night sweats. She has repeat CBC, comprehensive metabolic panel, LDH and myeloma panel performed recently and she is here for evaluation and discussion of her lab results.  ALLERGIES:  is allergic to amoxicillin and codeine.  MEDICATIONS:  Current Outpatient Prescriptions  Medication Sig Dispense Refill  . ALPRAZolam (XANAX) 0.5 MG tablet Take 0.5 mg by mouth at bedtime as needed.      [name] azelastine (ASTELIN) 137 MCG/SPRAY nasal spray Place 1 spray into the nose 2 (two) times daily. Use in each nostril as directed      . calcium-vitamin D 250-100 MG-UNIT per tablet Take 1 tablet by mouth 2 (two) times daily.      . cetirizine (ZYRTEC) 10 MG tablet Take 10 mg by mouth daily.      . cholecalciferol (VITAMIN D) 1000 UNITS tablet Take 2,000 Units by mouth daily.      [name] doxycycline (VIBRA-TABS) 100 MG tablet Take 100 mg by mouth Daily.      [name] Fexofenadine HCl (ALLEGRA PO) Take by mouth daily as needed.      . fluticasone (FLONASE) 50 MCG/ACT nasal spray Place 2 sprays into the nose daily.      . Fluticasone-Salmeterol (ADVAIR) 100-50 MCG/DOSE AEPB Inhale 2 puffs into the lungs every 12 (twelve) hours.      [name] ibuprofen (ADVIL,MOTRIN) 100 MG tablet Take 100 mg by mouth every 6 (six) hours as needed.      [name] levothyroxine (SYNTHROID, LEVOTHROID) 125 MCG tablet Take  125 mcg by mouth daily.      . Multiple Vitamin (MULTIVITAMIN) tablet Take 1 tablet by mouth daily.      . predniSONE (DELTASONE) 10 MG tablet Take 10 mg by mouth Daily.      . sertraline (ZOLOFT) 100 MG tablet Take 100 mg by mouth daily.      . verapamil (CALAN-SR) 240 MG CR tablet Take 240 mg by mouth 2 (two) times daily.      [name] levocetirizine (XYZAL) 5 MG tablet Take 5 mg by mouth every evening.      . montelukast (SINGULAIR) 10 MG tablet Take 19 mg by mouth Daily.        REVIEW OF SYSTEMS:  A comprehensive review of systems was negative except for: Respiratory: positive for dyspnea on exertion   PHYSICAL EXAMINATION: General appearance: alert, cooperative and no distress Head: Normocephalic, without obvious abnormality, atraumatic Lymph nodes: Cervical, supraclavicular, and axillary nodes normal. Resp: clear to auscultation bilaterally Cardio: regular rate and rhythm, S1, S2 normal, no murmur, click, rub or gallop GI: soft, non-tender; bowel sounds normal; no masses,  no organomegaly Extremities: extremities normal, atraumatic, no cyanosis or edema  ECOG PERFORMANCE STATUS: 0 - Asymptomatic  There were no vitals taken for this visit.  LABORATORY DATA: Lab Results  Component Value Date   WBC 16.0* 09/26/2011   HGB 13.0 09/26/2011   HCT  39.0 09/26/2011   MCV 93.0 09/26/2011   PLT 243 09/26/2011      Chemistry      Component Value Date/Time   NA 136 09/26/2011 1411   NA 137 03/20/2011 1509   K 4.2 09/26/2011 1411   K 3.4* 03/20/2011 1509   CL 99 09/26/2011 1411   CL 102 03/20/2011 1509   CO2 27 09/26/2011 1411   CO2 26 03/20/2011 1509   BUN 15.0 09/26/2011 1411   BUN 7 03/20/2011 1509   CREATININE 0.8 09/26/2011 1411   CREATININE 0.63 03/20/2011 1509      Component Value Date/Time   CALCIUM 10.1 09/26/2011 1411   CALCIUM 9.2 03/20/2011 1509   ALKPHOS 82 09/26/2011 1411   ALKPHOS 74 03/20/2011 1509   AST 13 09/26/2011 1411   AST 24 03/20/2011 1509   ALT 22 09/26/2011 1411   ALT 26  03/20/2011 1509   BILITOT 0.50 09/26/2011 1411   BILITOT 0.3 03/20/2011 1509       RADIOGRAPHIC STUDIES: No results found.  ASSESSMENT: This is a very pleasant 51 years old white female with history of monoclonal gammopathy of undetermined significance currently on observation with no evidence for disease progression.  PLAN: I discussed the lab result with the patient. I recommended for her to continue on observation for now with repeat CBC, comprehensive metabolic panel, LDH and myeloma panel in 6 months. She would come back for followup visit at that time. She was advised to call immediately if she has any concerning symptoms in the interval.  All questions were answered. The patient knows to call the clinic with any problems, questions or concerns. We can certainly see the patient much sooner if necessary.

## 2012-03-21 ENCOUNTER — Telehealth: Payer: Self-pay | Admitting: Internal Medicine

## 2012-03-21 NOTE — Telephone Encounter (Signed)
Pt called today to move lb/fu from the wk of 3/24 to the wk of 3/31. Pt has new d/t's.

## 2012-03-31 ENCOUNTER — Other Ambulatory Visit: Payer: BC Managed Care – PPO | Admitting: Lab

## 2012-04-03 ENCOUNTER — Ambulatory Visit: Payer: BC Managed Care – PPO | Admitting: Internal Medicine

## 2012-04-07 ENCOUNTER — Other Ambulatory Visit: Payer: Self-pay | Admitting: Medical Oncology

## 2012-04-07 ENCOUNTER — Other Ambulatory Visit (HOSPITAL_BASED_OUTPATIENT_CLINIC_OR_DEPARTMENT_OTHER): Payer: BC Managed Care – PPO | Admitting: Lab

## 2012-04-07 DIAGNOSIS — D472 Monoclonal gammopathy: Secondary | ICD-10-CM

## 2012-04-07 LAB — CBC WITH DIFFERENTIAL/PLATELET
BASO%: 0.6 % (ref 0.0–2.0)
EOS%: 1.4 % (ref 0.0–7.0)
MCH: 30 pg (ref 25.1–34.0)
MCHC: 33.8 g/dL (ref 31.5–36.0)
RBC: 3.97 10*6/uL (ref 3.70–5.45)
RDW: 13.9 % (ref 11.2–14.5)
lymph#: 1.4 10*3/uL (ref 0.9–3.3)

## 2012-04-07 LAB — COMPREHENSIVE METABOLIC PANEL (CC13)
ALT: 28 U/L (ref 0–55)
Albumin: 3.6 g/dL (ref 3.5–5.0)
BUN: 9.1 mg/dL (ref 7.0–26.0)
CO2: 27 mEq/L (ref 22–29)
Calcium: 9.5 mg/dL (ref 8.4–10.4)
Chloride: 104 mEq/L (ref 98–107)
Creatinine: 0.8 mg/dL (ref 0.6–1.1)

## 2012-04-07 LAB — LACTATE DEHYDROGENASE (CC13): LDH: 155 U/L (ref 125–245)

## 2012-04-08 HISTORY — PX: BONE MARROW BIOPSY: SHX199

## 2012-04-08 LAB — IGG, IGA, IGM
IgA: 97 mg/dL (ref 69–380)
IgG (Immunoglobin G), Serum: 643 mg/dL — ABNORMAL LOW (ref 690–1700)

## 2012-04-10 ENCOUNTER — Telehealth: Payer: Self-pay | Admitting: Internal Medicine

## 2012-04-10 ENCOUNTER — Ambulatory Visit (HOSPITAL_BASED_OUTPATIENT_CLINIC_OR_DEPARTMENT_OTHER): Payer: BC Managed Care – PPO | Admitting: Internal Medicine

## 2012-04-10 ENCOUNTER — Encounter: Payer: Self-pay | Admitting: Internal Medicine

## 2012-04-10 VITALS — BP 149/76 | HR 65 | Temp 97.3°F | Resp 20 | Ht 68.0 in | Wt 266.5 lb

## 2012-04-10 DIAGNOSIS — D472 Monoclonal gammopathy: Secondary | ICD-10-CM

## 2012-04-10 NOTE — Progress Notes (Signed)
[facility] [phone]   [phone]  OFFICE PROGRESS NOTE  Brenda Bumpers, MD [address] [place] [address]  DIAGNOSIS: MGUS diagnosed in September 2010.   CURRENT THERAPY: Observation.  INTERVAL HISTORY: Brenda Conner 52 y.o. female returns to the clinic today for routine six-month follow up visit. The patient is feeling fine today with no specific complaints except for numbness and tingling is in the fingers started 2 weeks ago. The patient denied having any significant weight loss or night sweats. She denied having any fatigue or weakness. She has no chest pain, shortness breath, cough or hemoptysis. The patient denied having any significant bleeding issues. She had repeat myeloma panel performed recently and she is here for evaluation and discussion of her lab results.   ALLERGIES:  is allergic to amoxicillin and codeine.  MEDICATIONS:  Current Outpatient Prescriptions  Medication Sig Dispense Refill  . ALPRAZolam (XANAX) 0.5 MG tablet Take 0.5 mg by mouth at bedtime as needed.      [name] azelastine (ASTELIN) 137 MCG/SPRAY nasal spray Place 1 spray into the nose 2 (two) times daily. Use in each nostril as directed      . calcium-vitamin D 250-100 MG-UNIT per tablet Take 1 tablet by mouth 2 (two) times daily.      . cetirizine (ZYRTEC) 10 MG tablet Take 10 mg by mouth daily.      . cholecalciferol (VITAMIN D) 1000 UNITS tablet Take 2,000 Units by mouth daily.      [name] Fexofenadine HCl (ALLEGRA PO) Take by mouth daily as needed.      . fluticasone (FLONASE) 50 MCG/ACT nasal spray Place 2 sprays into the nose daily.      . Fluticasone-Salmeterol (ADVAIR) 100-50 MCG/DOSE AEPB Inhale 2 puffs into the lungs every 12 (twelve) hours.      [name] ibuprofen (ADVIL,MOTRIN) 100 MG tablet Take 100 mg by mouth every 6 (six) hours as needed.      [name] levothyroxine (SYNTHROID, LEVOTHROID) 125 MCG tablet Take 125 mcg by mouth daily.      . Multiple Vitamin  (MULTIVITAMIN) tablet Take 1 tablet by mouth daily.      [name] omeprazole (PRILOSEC) 40 MG capsule Take 40 mg by mouth 2 (two) times daily.       . pravastatin (PRAVACHOL) 40 MG tablet Take 40 mg by mouth daily.      . sertraline (ZOLOFT) 100 MG tablet Take 100 mg by mouth daily.      . verapamil (CALAN-SR) 240 MG CR tablet Take 240 mg by mouth 2 (two) times daily.       No current facility-administered medications for this visit.    REVIEW OF SYSTEMS:  A comprehensive review of systems was negative except for: Neurological: positive for paresthesia   PHYSICAL EXAMINATION: General appearance: alert, cooperative and no distress Head: Normocephalic, without obvious abnormality, atraumatic Neck: no adenopathy Resp: clear to auscultation bilaterally Cardio: regular rate and rhythm, S1, S2 normal, no murmur, click, rub or gallop GI: soft, non-tender; bowel sounds normal; no masses,  no organomegaly Extremities: extremities normal, atraumatic, no cyanosis or edema  ECOG PERFORMANCE STATUS: 1 - Symptomatic but completely ambulatory  Blood pressure 149/76, pulse 65, temperature 97.3 F (36.3 C), temperature source Oral, resp. rate 20, height 5\' 8"  (1.727 m), weight 266 lb 8 oz (120.884 kg).  LABORATORY DATA: Lab Results  Component Value Date   WBC 6.5 04/07/2012   HGB 11.9 04/07/2012   HCT  35.2 04/07/2012   MCV 88.7 04/07/2012   PLT 233 04/07/2012      Chemistry      Component Value Date/Time   NA 141 04/07/2012 1415   NA 137 03/20/2011 1509   K 4.2 04/07/2012 1415   K 3.4* 03/20/2011 1509   CL 104 04/07/2012 1415   CL 102 03/20/2011 1509   CO2 27 04/07/2012 1415   CO2 26 03/20/2011 1509   BUN 9.1 04/07/2012 1415   BUN 7 03/20/2011 1509   CREATININE 0.8 04/07/2012 1415   CREATININE 0.63 03/20/2011 1509      Component Value Date/Time   CALCIUM 9.5 04/07/2012 1415   CALCIUM 9.2 03/20/2011 1509   ALKPHOS 99 04/07/2012 1415   ALKPHOS 74 03/20/2011 1509   AST 27 04/07/2012 1415   AST 24 03/20/2011  1509   ALT 28 04/07/2012 1415   ALT 26 03/20/2011 1509   BILITOT 0.43 04/07/2012 1415   BILITOT 0.3 03/20/2011 1509      other lab result: IgG 643, IgA 97 and IgM 925. Free kappa light chain 0.03, free lambda light chain 90.30 and a kappa lambda ratio of 0.00.  RADIOGRAPHIC STUDIES: No results found.  ASSESSMENT: this is a very pleasant 52 years old white female with history of monoclonal gammopathy of undetermined significance currently on observation.   PLAN: the patient is doing fine but there is increase in the free lambda light chain concerning for disease progression. I discussed the lab result with the patient today. I recommended for her to consider repeating a bone marrow biopsy and aspirate for evaluation of her disease. This will be arranged to be done by interventional radiology in the next few weeks. I would see the patient back for follow up visit in one month's for evaluation and discussion of her biopsy results and further recommendation regarding treatment of her condition. She was advised to call immediately if she has any concerning symptoms in the interval.  All questions were answered. The patient knows to call the clinic with any problems, questions or concerns. We can certainly see the patient much sooner if necessary.

## 2012-04-12 NOTE — Patient Instructions (Signed)
The free lambda light chain showed increased questionable for disease progression. We discussed proceeding with repeating bone marrow biopsy and aspirate. Follow up in one month's for evaluation and discussion of the biopsy results and treatment options

## 2012-04-15 ENCOUNTER — Other Ambulatory Visit (HOSPITAL_COMMUNITY): Payer: BC Managed Care – PPO

## 2012-04-15 ENCOUNTER — Encounter (HOSPITAL_COMMUNITY): Payer: Self-pay | Admitting: Pharmacy Technician

## 2012-04-16 ENCOUNTER — Other Ambulatory Visit: Payer: Self-pay | Admitting: Radiology

## 2012-04-17 ENCOUNTER — Other Ambulatory Visit: Payer: Self-pay | Admitting: Radiology

## 2012-04-21 ENCOUNTER — Ambulatory Visit (HOSPITAL_COMMUNITY)
Admission: RE | Admit: 2012-04-21 | Discharge: 2012-04-21 | Disposition: A | Discharge status: Home / Self Care | Payer: BC Managed Care – PPO | Source: Ambulatory Visit | Attending: Internal Medicine | Admitting: Internal Medicine

## 2012-04-21 ENCOUNTER — Encounter (HOSPITAL_COMMUNITY): Payer: Self-pay

## 2012-04-21 DIAGNOSIS — D472 Monoclonal gammopathy: Secondary | ICD-10-CM

## 2012-04-21 DIAGNOSIS — D649 Anemia, unspecified: Secondary | ICD-10-CM | POA: Insufficient documentation

## 2012-04-21 HISTORY — DX: Other specified postprocedural states: Z98.890

## 2012-04-21 HISTORY — DX: Anxiety disorder, unspecified: F41.9

## 2012-04-21 HISTORY — DX: Sleep apnea, unspecified: G47.30

## 2012-04-21 HISTORY — DX: Unspecified asthma, uncomplicated: J45.909

## 2012-04-21 HISTORY — DX: Major depressive disorder, single episode, unspecified: F32.9

## 2012-04-21 HISTORY — DX: Essential (primary) hypertension: I10

## 2012-04-21 HISTORY — DX: Malignant (primary) neoplasm, unspecified: C80.1

## 2012-04-21 HISTORY — DX: Hypothyroidism, unspecified: E03.9

## 2012-04-21 HISTORY — DX: Other specified postprocedural states: R11.2

## 2012-04-21 HISTORY — DX: Depression, unspecified: F32.A

## 2012-04-21 LAB — BONE MARROW EXAM: Bone Marrow Exam: 247

## 2012-04-21 LAB — PROTIME-INR: Prothrombin Time: 12.2 seconds (ref 11.6–15.2)

## 2012-04-21 LAB — CBC
Platelets: 216 10*3/uL (ref 150–400)
RBC: 3.96 MIL/uL (ref 3.87–5.11)
WBC: 7.1 10*3/uL (ref 4.0–10.5)

## 2012-04-21 LAB — APTT: aPTT: 28 seconds (ref 24–37)

## 2012-04-21 MED ORDER — HYDROCODONE-ACETAMINOPHEN 5-325 MG PO TABS: 1.0000 | ORAL_TABLET | ORAL | Status: DC | PRN

## 2012-04-21 MED ORDER — SODIUM CHLORIDE 0.9 % IV SOLN
Freq: Once | INTRAVENOUS | Status: AC
  Administered 2012-04-21: 07:00:00 via INTRAVENOUS

## 2012-04-21 MED ORDER — MIDAZOLAM HCL 2 MG/2ML IJ SOLN
INTRAMUSCULAR | Status: AC | PRN
  Administered 2012-04-21: 1 mg via INTRAVENOUS
  Administered 2012-04-21 (×2): 0.5 mg via INTRAVENOUS
  Administered 2012-04-21: 2 mg via INTRAVENOUS

## 2012-04-21 MED ORDER — MIDAZOLAM HCL 2 MG/2ML IJ SOLN
INTRAMUSCULAR | Status: AC
  Filled 2012-04-21: qty 6

## 2012-04-21 MED ORDER — FENTANYL CITRATE 0.05 MG/ML IJ SOLN
INTRAMUSCULAR | Status: AC
  Filled 2012-04-21: qty 6

## 2012-04-21 MED ORDER — FENTANYL CITRATE 0.05 MG/ML IJ SOLN
INTRAMUSCULAR | Status: AC | PRN
  Administered 2012-04-21: 25 ug via INTRAVENOUS
  Administered 2012-04-21: 100 ug via INTRAVENOUS
  Administered 2012-04-21: 50 ug via INTRAVENOUS
  Administered 2012-04-21: 25 ug via INTRAVENOUS

## 2012-04-21 NOTE — H&P (Signed)
Agree with PA note.    Signed,  Heath K. McCullough, MD Vascular & Interventional Radiologist [facility]  

## 2012-04-21 NOTE — Procedures (Signed)
Interventional Radiology Procedure Note  Procedure: CT guided aspirate and core biopsy of right iliac bone Complications: None Recommendations: - Bedrest supine x 2 hrs - Hydrocodone PRN  Pain - Follow biopsy results  Signed,  Heath K. McCullough, MD Vascular & Interventional Radiologist [facility]  

## 2012-04-21 NOTE — H&P (Signed)
Chief Complaint: "I'm here for a bone marrow biopsy" Referring Physician:Mohamed HPI: Brenda Conner is an 52 y.o. female with hx of monoclonal gammopathy who is referred for bone marrow biopsy. She had one about 3 years ago and did well. PMHx and meds reviewed.  Past Medical History:  Past Medical History  Diagnosis Date  . PONV (postoperative nausea and vomiting)   . Hypertension   . Hypothyroidism   . Anxiety   . Depression   . Asthma   . Sleep apnea     CPAP at bedtime  . Cancer     waldenstroms/ macroglobinulemia    Past Surgical History:  Past Surgical History  Procedure Laterality Date  . Cholecystectomy    . Back surgery      L-L5  . Bil laprascopic knee surgery    . Uterine fibroid embolization    . Bone marrow biopsy      2011    Family History: No family history on file.  Social History:  reports that she has never smoked. She has never used smokeless tobacco. Her alcohol and drug histories are not on file.  Allergies:  Allergies  Allergen Reactions  . Amoxicillin   . Codeine     Medications: ALPRAZolam (XANAX) 0.5 MG tablet (Taking) Sig - Route: Take 0.5 mg by mouth at bedtime as needed for anxiety. - Oral Class: Historical Med Number of times this order has been changed since signing: 1 Order [address] azelastine (ASTELIN) 137 MCG/SPRAY nasal spray (Taking) Sig - Route: Place 1 spray into the nose 2 (two) times daily. Use in each nostril as directed - Nasal Class: Historical Med Number of times this order has been changed since signing: 1 Order [address] calcium-vitamin D 250-100 MG-UNIT per tablet (Taking) Sig - Route: Take 1 tablet by mouth 2 (two) times daily. - Oral Class: Historical Med Number of times this order has been changed since signing: 1 Order [address] cetirizine (ZYRTEC) 10 MG tablet (Taking) Sig - Route: Take 10 mg by mouth at bedtime. - Oral Class: Historical Med Number of times this order has been changed since signing: 2 Order [address] fluticasone (FLONASE) 50 MCG/ACT nasal spray (Taking) Sig - Route: Place 2 sprays into the nose daily. - Nasal Class: Historical Med Number of times this order has been changed since signing: 1 Order [address] Fluticasone-Salmeterol (ADVAIR) 100-50 MCG/DOSE AEPB (Taking) Sig - Route: Inhale 2 puffs into the lungs every 12 (twelve) hours. - Inhalation Class: Historical Med Number of times this order has been changed since signing: 1 Order [address] ibuprofen (ADVIL,MOTRIN) 100 MG tablet (Taking) Sig - Route: Take 100 mg by mouth every 6 (six) hours as needed. - Oral Class: Historical Med Number of times this order has been changed since signing: 1 Order [address] levothyroxine (SYNTHROID, LEVOTHROID) 125 MCG tablet (Taking) Sig - Route: Take 125 mcg by mouth daily before breakfast. - Oral Class: Historical Med Number of times this order has been changed since signing: 2 Order [address] omeprazole (PRILOSEC) 40 MG capsule (Taking) 03/20/2012 Sig - Route: Take 40 mg by mouth 2 (two) times daily. - Oral Class: Historical Med Number of times this order has been changed since signing: 3 Order [address] pravastatin (PRAVACHOL) 40 MG tablet (Taking) 03/20/2012 Sig - Route: Take 40 mg by mouth every evening. - Oral Class: Historical Med Number of times this order has been changed since signing: 2 Order [address] sertraline (ZOLOFT) 100 MG  tablet (Taking) Sig - Route: Take 100 mg by mouth every morning. - Oral Class: Historical Med Number of times this order has been changed since signing: 2 Order [address] verapamil (CALAN-SR) 240 MG CR tablet (Taking) Sig - Route: Take 240 mg by mouth 2 (two) times daily.    Please HPI for pertinent positives, otherwise complete 10 system ROS negative.  Physical Exam: Temp: 97.8, HR: 67, RR: 16, BP: 167/73, O2: 100%   General Appearance:  Alert, cooperative, no distress, appears stated age  Head:  Normocephalic, without obvious abnormality, atraumatic  ENT:  Unremarkable  Neck: Supple, symmetrical, trachea midline,  Lungs:   Clear to auscultation bilaterally, no w/r/r, respirations unlabored without use of accessory muscles.  Chest Wall:  No tenderness or deformity  Heart:  Regular rate and rhythm, S1, S2 normal, no murmur, rub or gallop.   Neurologic: Normal affect, no gross deficits.   Results for orders placed during the hospital encounter of 04/21/12 (from the past 48 hour(s))  APTT     Status: None   Collection Time    04/21/12  7:20 AM      Result Value Range   aPTT 28  24 - 37 seconds  CBC     Status: Abnormal   Collection Time    04/21/12  7:20 AM      Result Value Range   WBC 7.1  4.0 - 10.5 K/uL   RBC 3.96  3.87 - 5.11 MIL/uL   Hemoglobin 11.9 (*) 12.0 - 15.0 g/dL   HCT 16.1 (*) 09.6 - 04.5 %   MCV 89.1  78.0 - 100.0 fL   MCH 30.1  26.0 - 34.0 pg   MCHC 33.7  30.0 - 36.0 g/dL   RDW 40.9  81.1 - 91.4 %   Platelets 216  150 - 400 K/uL  PROTIME-INR     Status: None   Collection Time    04/21/12  7:20 AM      Result Value Range   Prothrombin Time 12.2  11.6 - 15.2 seconds   INR 0.91  0.00 - 1.49   No results found.  Assessment/Plan Monoclonal gammapathy(MGUS) For CT guided BM BX Discussed procedure, risks, use of sedation. Labs reviewed Consent signed in chart  Brayton El PA-C 04/21/2012, 7:56 AM

## 2012-04-30 ENCOUNTER — Encounter: Payer: Self-pay | Admitting: Internal Medicine

## 2012-04-30 LAB — CHROMOSOME ANALYSIS, BONE MARROW

## 2012-05-08 ENCOUNTER — Ambulatory Visit (HOSPITAL_BASED_OUTPATIENT_CLINIC_OR_DEPARTMENT_OTHER): Payer: BC Managed Care – PPO | Admitting: Internal Medicine

## 2012-05-08 ENCOUNTER — Encounter: Payer: Self-pay | Admitting: Internal Medicine

## 2012-05-08 ENCOUNTER — Telehealth: Payer: Self-pay | Admitting: Internal Medicine

## 2012-05-08 VITALS — BP 144/71 | HR 68 | Temp 98.6°F | Resp 20 | Ht 68.0 in | Wt 267.6 lb

## 2012-05-08 DIAGNOSIS — E8809 Other disorders of plasma-protein metabolism, not elsewhere classified: Secondary | ICD-10-CM

## 2012-05-08 NOTE — Telephone Encounter (Signed)
gv and printed appt sched and avs for ptf or Nov °

## 2012-05-08 NOTE — Patient Instructions (Signed)
Your bone marrow biopsy and aspirate showed no significant increase in the plasma cells. Taking your symptoms together, this may be concerning for POEMS syndrome. Followup visit in 6 months with repeat myeloma panel.

## 2012-05-08 NOTE — Progress Notes (Signed)
[facility] Telephone:([phone]   Fax:(336) [phone]  OFFICE PROGRESS NOTE  [name], MD [address] [place] [address]  DIAGNOSIS: MGUS diagnosed in September 2010, with additional symptoms suggestive of POEMS syndrome.   CURRENT THERAPY: Observation.  INTERVAL HISTORY: Brenda Conner 52 y.o. female returns to the clinic today for followup visit. The patient is feeling fine today with no specific complaints except for the numbness and tingling is in her upper extremities. She denied having any significant weight loss or night sweats. She tolerated the bone marrow procedure fairly well and she is here today for evaluation and discussion of the biopsy results. The patient also complained of some skin rash mainly on the upper extremities and it comes more in the springtime and she also has a history of hypothyroidism.  MEDICAL HISTORY: Past Medical History  Diagnosis Date  . PONV (postoperative nausea and vomiting)   . Hypertension   . Hypothyroidism   . Anxiety   . Depression   . Asthma   . Sleep apnea     CPAP at bedtime  . Cancer     waldenstroms/ macroglobinulemia    ALLERGIES:  is allergic to amoxicillin and codeine.  MEDICATIONS:  Current Outpatient Prescriptions  Medication Sig Dispense Refill  . ALPRAZolam (XANAX) 0.5 MG tablet Take 0.5 mg by mouth at bedtime as needed for anxiety.       [name] azelastine (ASTELIN) 137 MCG/SPRAY nasal spray Place 1 spray into the nose 2 (two) times daily. Use in each nostril as directed      . calcium-vitamin D 250-100 MG-UNIT per tablet Take 1 tablet by mouth 2 (two) times daily.      . cetirizine (ZYRTEC) 10 MG tablet Take 10 mg by mouth at bedtime.       . fexofenadine (ALLEGRA) 180 MG tablet Take 180 mg by mouth every morning.      . fluticasone (FLONASE) 50 MCG/ACT nasal spray Place 2 sprays into the nose daily.      . Fluticasone-Salmeterol (ADVAIR) 100-50 MCG/DOSE AEPB Inhale 2 puffs into the  lungs every 12 (twelve) hours.      [name] levothyroxine (SYNTHROID, LEVOTHROID) 125 MCG tablet Take 125 mcg by mouth daily before breakfast.       . omeprazole (PRILOSEC) 40 MG capsule Take 40 mg by mouth 2 (two) times daily.       . pravastatin (PRAVACHOL) 40 MG tablet Take 40 mg by mouth every evening.       . sertraline (ZOLOFT) 100 MG tablet Take 100 mg by mouth every morning.       . verapamil (CALAN-SR) 240 MG CR tablet Take 240 mg by mouth 2 (two) times daily.      [name] ibuprofen (ADVIL,MOTRIN) 100 MG tablet Take 100 mg by mouth every 6 (six) hours as needed.       No current facility-administered medications for this visit.    SURGICAL HISTORY:  Past Surgical History  Procedure Laterality Date  . Cholecystectomy    . Back surgery      L-L5  . Bil laprascopic knee surgery    . Uterine fibroid embolization    . Bone marrow biopsy      2011    REVIEW OF SYSTEMS:  A comprehensive review of systems was negative except for: Neurological: positive for paresthesia Skin rash mainly in the upper extremity   PHYSICAL EXAMINATION: General appearance: alert, cooperative and no distress Head: Normocephalic, without  obvious abnormality, atraumatic Neck: no adenopathy Lymph nodes: Cervical, supraclavicular, and axillary nodes normal. Resp: clear to auscultation bilaterally Cardio: regular rate and rhythm, S1, S2 normal, no murmur, click, rub or gallop GI: soft, non-tender; bowel sounds normal; no masses,  no organomegaly Extremities: extremities normal, atraumatic, no cyanosis or edema  ECOG PERFORMANCE STATUS: 0 - Asymptomatic  Blood pressure 144/71, pulse 68, temperature 98.6 F (37 C), temperature source Oral, resp. rate 20, height 5\' 8"  (1.727 m), weight 267 lb 9.6 oz (121.383 kg).  LABORATORY DATA: Lab Results  Component Value Date   WBC 7.1 04/21/2012   HGB 11.9* 04/21/2012   HCT 35.3* 04/21/2012   MCV 89.1 04/21/2012   PLT 216 04/21/2012      Chemistry      Component Value  Date/Time   NA 141 04/07/2012 1415   NA 137 03/20/2011 1509   K 4.2 04/07/2012 1415   K 3.4* 03/20/2011 1509   CL 104 04/07/2012 1415   CL 102 03/20/2011 1509   CO2 27 04/07/2012 1415   CO2 26 03/20/2011 1509   BUN 9.1 04/07/2012 1415   BUN 7 03/20/2011 1509   CREATININE 0.8 04/07/2012 1415   CREATININE 0.63 03/20/2011 1509      Component Value Date/Time   CALCIUM 9.5 04/07/2012 1415   CALCIUM 9.2 03/20/2011 1509   ALKPHOS 99 04/07/2012 1415   ALKPHOS 74 03/20/2011 1509   AST 27 04/07/2012 1415   AST 24 03/20/2011 1509   ALT 28 04/07/2012 1415   ALT 26 03/20/2011 1509   BILITOT 0.43 04/07/2012 1415   BILITOT 0.3 03/20/2011 1509       RADIOGRAPHIC STUDIES: Ct Biopsy  04/21/2012  *RADIOLOGY REPORT*  CT GUIDED RIGHT ILIAC BONE MARROW ASPIRATION AND BONE MARROW CORE BIOPSIES  Date: 04/21/12  Clinical History: 52 year-old female with a past medical history of monoclonal gammopathy.  She presents for CT-guided bone marrow biopsy.  Procedures Performed: 1. CT guided bone marrow aspiration and core biopsy  Interventional Radiologist:  Sterling Big, MD  Sedation: Moderate (conscious) sedation was used.  Four mg Versed, 200 mcg Fentanyl were administered intravenously.  The patient's vital signs were monitored continuously by radiology nursing throughout the procedure.  Sedation Time: 20 minutes  PROCEDURE/FINDINGS:  Informed consent was obtained from the patient following explanation of the procedure, risks, benefits and alternatives. The patient understands, agrees and consents for the procedure. All questions were addressed.  A time out was performed.  The patient was positioned prone and noncontrast localization CT was performed of the pelvis to demonstrate the iliac marrow spaces.  Maximal barrier sterile technique utilized including caps, mask, sterile gowns, sterile gloves, large sterile drape, hand hygiene, and betadine prep.  Under sterile conditions and local anesthesia, an 11 gauge coaxial bone  biopsy needle was advanced into the right iliac marrow space. Needle position was confirmed with CT imaging. Initially, bone marrow aspiration was performed. Next, the 11 gauge outer cannula was utilized to obtain a right iliac bone marrow core biopsy. Needle was removed. Hemostasis was obtained with compression. The patient tolerated the procedure well. Samples were prepared with the cytotechnologist. No immediate complications.  IMPRESSION:  CT guided right iliac bone marrow aspiration and core biopsy.  Signed,  Sterling Big, MD Vascular & Interventional Radiologist [facility]   Original Report Authenticated By: Malachy Moan, M.D.   BONE MARROW REPORT FINAL DIAGNOSIS Diagnosis Bone Marrow, Aspirate,Biopsy, and Clot, right iliac - SLIGHTLY HYPERCELLULAR BONE MARROW FOR AGE WITH  TRILINEAGE HEMATOPOIESIS AND 3% PLASMA CELLS. - SEE COMMENT. PERIPHERAL BLOOD: - MILD NORMOCYTIC-NORMOCHROMIC ANEMIA. Diagnosis Note The bone marrow material is limited given the lack of aspirate. Evaluation of touch imprints and core biopsy show a relatively minor component of plasma cells estimated at 3% in the touch imprints associated with scattering of small clusters of plasma cells mostly composed of few cells. Large clusters or sheets of plasma cells are not identified. Immunohistochemical stains show that the plasma cells display lambda light chain excess/restriction consistent with plasma dyscrasia. Clinical correlation is recommended. (BNS:gt, 04/23/12) Brenda Bruin MD Pathologist, Electronic Signature (Case signed 04/24/2012)  ASSESSMENT: This is a very pleasant 52 years old white female with a monoclonal gammopathy in addition to peripheral neuropathy, and adenopathy in the form of hypothyroidism and skin rash. The symptoms combined are suspicious for POEMS syndrome   PLAN: I discussed the biopsy results with the patient today. I recommended for her to continue on observation with repeat  myeloma panel in 6 months. She was advised to call immediately if her symptoms started getting worse as I may consider the patient for treatment with multiple myeloma regimen. The patient agreed to the current plan.  All questions were answered. The patient knows to call the clinic with any problems, questions or concerns. We can certainly see the patient much sooner if necessary.

## 2012-05-15 ENCOUNTER — Encounter: Payer: Self-pay | Admitting: Internal Medicine

## 2012-10-02 ENCOUNTER — Encounter: Payer: Self-pay | Admitting: Obstetrics & Gynecology

## 2012-10-08 ENCOUNTER — Encounter: Payer: Self-pay | Admitting: Obstetrics & Gynecology

## 2012-11-10 ENCOUNTER — Other Ambulatory Visit (HOSPITAL_BASED_OUTPATIENT_CLINIC_OR_DEPARTMENT_OTHER): Payer: BC Managed Care – PPO

## 2012-11-10 DIAGNOSIS — E8809 Other disorders of plasma-protein metabolism, not elsewhere classified: Secondary | ICD-10-CM

## 2012-11-10 DIAGNOSIS — D472 Monoclonal gammopathy: Secondary | ICD-10-CM

## 2012-11-10 LAB — COMPREHENSIVE METABOLIC PANEL (CC13)
AST: 29 U/L (ref 5–34)
Albumin: 3.8 g/dL (ref 3.5–5.0)
Alkaline Phosphatase: 90 U/L (ref 40–150)
BUN: 9.6 mg/dL (ref 7.0–26.0)
Creatinine: 0.8 mg/dL (ref 0.6–1.1)
Potassium: 3.9 mEq/L (ref 3.5–5.1)
Total Bilirubin: 0.49 mg/dL (ref 0.20–1.20)

## 2012-11-10 LAB — CBC WITH DIFFERENTIAL/PLATELET
Basophils Absolute: 0.1 10*3/uL (ref 0.0–0.1)
EOS%: 1.5 % (ref 0.0–7.0)
HGB: 12 g/dL (ref 11.6–15.9)
MCH: 30.7 pg (ref 25.1–34.0)
MCV: 91.1 fL (ref 79.5–101.0)
MONO%: 6.3 % (ref 0.0–14.0)
RDW: 13.3 % (ref 11.2–14.5)

## 2012-11-12 LAB — BETA 2 MICROGLOBULIN, SERUM: Beta-2 Microglobulin: 1.71 mg/L (ref 1.01–1.73)

## 2012-11-12 LAB — IGG, IGA, IGM
IgA: 99 mg/dL (ref 69–380)
IgG (Immunoglobin G), Serum: 625 mg/dL — ABNORMAL LOW (ref 690–1700)

## 2012-11-12 LAB — KAPPA/LAMBDA LIGHT CHAINS
Kappa free light chain: 0.73 mg/dL (ref 0.33–1.94)
Lambda Free Lght Chn: 87.6 mg/dL — ABNORMAL HIGH (ref 0.57–2.63)

## 2012-11-13 ENCOUNTER — Other Ambulatory Visit: Payer: Self-pay

## 2012-11-13 ENCOUNTER — Ambulatory Visit (HOSPITAL_BASED_OUTPATIENT_CLINIC_OR_DEPARTMENT_OTHER): Payer: BC Managed Care – PPO | Admitting: Internal Medicine

## 2012-11-13 VITALS — BP 177/87 | HR 69 | Temp 97.8°F | Resp 18 | Ht 66.0 in | Wt 271.8 lb

## 2012-11-13 DIAGNOSIS — G609 Hereditary and idiopathic neuropathy, unspecified: Secondary | ICD-10-CM

## 2012-11-13 DIAGNOSIS — E8809 Other disorders of plasma-protein metabolism, not elsewhere classified: Secondary | ICD-10-CM

## 2012-11-13 DIAGNOSIS — D472 Monoclonal gammopathy: Secondary | ICD-10-CM

## 2012-11-13 NOTE — Progress Notes (Signed)
[facility] [phone]   [phone]  OFFICE PROGRESS NOTE  [name], MD [address] [place] [address]  DIAGNOSIS: MGUS diagnosed in September 2010, with additional symptoms suggestive of POEMS syndrome.   CURRENT THERAPY: Observation.  INTERVAL HISTORY: Brenda Conner 52 y.o. female returns to the clinic today for followup visit. The patient is feeling fine today with no specific complaints. She denied having any significant weight loss or night sweats. The patient denied having any fever or chills. She has no chest pain, shortness breath, cough or hemoptysis. She denied having any aching pain. She had repeat myeloma panel and she is here for evaluation and discussion of her lab results.  MEDICAL HISTORY: Past Medical History  Diagnosis Date  . PONV (postoperative nausea and vomiting)   . Hypertension   . Hypothyroidism   . Anxiety   . Depression   . Asthma   . Sleep apnea     CPAP at bedtime  . Cancer     waldenstroms/ macroglobinulemia    ALLERGIES:  is allergic to amoxicillin and codeine.  MEDICATIONS:  Current Outpatient Prescriptions  Medication Sig Dispense Refill  . ALPRAZolam (XANAX) 0.5 MG tablet Take 0.5 mg by mouth at bedtime as needed for anxiety.       [name] azelastine (ASTELIN) 137 MCG/SPRAY nasal spray Place 1 spray into the nose 2 (two) times daily. Use in each nostril as directed      . calcium-vitamin D 250-100 MG-UNIT per tablet Take 1 tablet by mouth 2 (two) times daily.      . cetirizine (ZYRTEC) 10 MG tablet Take 10 mg by mouth at bedtime.       . fexofenadine (ALLEGRA) 180 MG tablet Take 180 mg by mouth every morning.      . fluticasone (FLONASE) 50 MCG/ACT nasal spray Place 2 sprays into the nose daily.      . Fluticasone-Salmeterol (ADVAIR) 100-50 MCG/DOSE AEPB Inhale 2 puffs into the lungs every 12 (twelve) hours.      [name] ibuprofen (ADVIL,MOTRIN) 100 MG tablet Take 100 mg by mouth every 6 (six)  hours as needed.      [name] levothyroxine (SYNTHROID, LEVOTHROID) 125 MCG tablet Take 125 mcg by mouth daily before breakfast.       . omeprazole (PRILOSEC) 40 MG capsule Take 40 mg by mouth 2 (two) times daily.       . pravastatin (PRAVACHOL) 40 MG tablet Take 40 mg by mouth every evening.       . sertraline (ZOLOFT) 100 MG tablet Take 100 mg by mouth every morning.       . verapamil (CALAN-SR) 240 MG CR tablet Take 240 mg by mouth 2 (two) times daily.       No current facility-administered medications for this visit.    SURGICAL HISTORY:  Past Surgical History  Procedure Laterality Date  . Cholecystectomy    . Back surgery      L-L5  . Bil laprascopic knee surgery    . Uterine fibroid embolization    . Bone marrow biopsy      2011    REVIEW OF SYSTEMS:  A comprehensive review of systems was negative.   PHYSICAL EXAMINATION: General appearance: alert, cooperative and no distress Head: Normocephalic, without obvious abnormality, atraumatic Neck: no adenopathy Lymph nodes: Cervical, supraclavicular, and axillary nodes normal. Resp: clear to auscultation bilaterally Cardio: regular rate and rhythm, S1, S2 normal, no murmur, click, rub  or gallop GI: soft, non-tender; bowel sounds normal; no masses,  no organomegaly Extremities: extremities normal, atraumatic, no cyanosis or edema  ECOG PERFORMANCE STATUS: 0 - Asymptomatic  There were no vitals taken for this visit.  LABORATORY DATA: Lab Results  Component Value Date   WBC 6.4 11/10/2012   HGB 12.0 11/10/2012   HCT 35.7 11/10/2012   MCV 91.1 11/10/2012   PLT 232 11/10/2012      Chemistry      Component Value Date/Time   NA 139 11/10/2012 0808   NA 137 03/20/2011 1509   K 3.9 11/10/2012 0808   K 3.4* 03/20/2011 1509   CL 104 04/07/2012 1415   CL 102 03/20/2011 1509   CO2 20* 11/10/2012 0808   CO2 26 03/20/2011 1509   BUN 9.6 11/10/2012 0808   BUN 7 03/20/2011 1509   CREATININE 0.8 11/10/2012 0808   CREATININE 0.63 03/20/2011 1509        Component Value Date/Time   CALCIUM 9.7 11/10/2012 0808   CALCIUM 9.2 03/20/2011 1509   ALKPHOS 90 11/10/2012 0808   ALKPHOS 74 03/20/2011 1509   AST 29 11/10/2012 0808   AST 24 03/20/2011 1509   ALT 33 11/10/2012 0808   ALT 26 03/20/2011 1509   BILITOT 0.49 11/10/2012 0808   BILITOT 0.3 03/20/2011 1509      ASSESSMENT AND PLAN: This is a very pleasant 52 years old white female with a monoclonal gammopathy in addition to peripheral neuropathy, and adenopathy in the form of hypothyroidism and skin rash. The symptoms combined are suspicious for POEMS syndrome I discussed the lab results with the patient today. I recommended for her to continue on observation with repeat myeloma panel in 6 months. She was advised to call immediately if her symptoms started getting worse as I may consider the patient for treatment with multiple myeloma regimen. The patient agreed to the current plan.  All questions were answered. The patient knows to call the clinic with any problems, questions or concerns. We can certainly see the patient much sooner if necessary.

## 2012-11-13 NOTE — Patient Instructions (Signed)
Followup in 6 months with repeat myeloma panel.

## 2012-11-20 ENCOUNTER — Encounter: Payer: Self-pay | Admitting: Obstetrics & Gynecology

## 2012-11-21 ENCOUNTER — Ambulatory Visit (INDEPENDENT_AMBULATORY_CARE_PROVIDER_SITE_OTHER): Payer: BC Managed Care – PPO | Admitting: Obstetrics & Gynecology

## 2012-11-21 ENCOUNTER — Encounter: Payer: Self-pay | Admitting: Obstetrics & Gynecology

## 2012-11-21 VITALS — BP 158/90 | HR 60 | Resp 25 | Ht 67.5 in | Wt 274.2 lb

## 2012-11-21 DIAGNOSIS — Z01419 Encounter for gynecological examination (general) (routine) without abnormal findings: Secondary | ICD-10-CM

## 2012-11-21 NOTE — Progress Notes (Signed)
52 y.o. G0P0000 SingleCaucasianF here for annual exam.  No vaginal bleeding.  Diagnosed with POEMS syndrome.  Had a second bone marrow biopsy in April.  This is still a working diagnosis.  She is asymptomatic enough to not need treatment.  Feels like she is going through menopause and feeling more emotional than "normal" for her.    Patient's last menstrual period was 07/09/2011.          Sexually active: no  The current method of family planning is none.    Exercising: yes  walking Smoker:  no  Health Maintenance: Pap:  10/23/11 WNL/negative HR HPV History of abnormal Pap:  no MMG:  11/07/11 3D normal, pt will schedule. Colonoscopy: 8/10--Dr Medoff, f/u 10 years. BMD:   2006 TDaP:  2007 Screening Labs: PCP, Hb today: PCP, Urine today: PCP   reports that she has never smoked. She has never used smokeless tobacco. She reports that she does not drink alcohol or use illicit drugs.  Past Medical History  Diagnosis Date  . PONV (postoperative nausea and vomiting)   . Hypertension   . Anxiety   . Depression   . Asthma   . Sleep apnea     CPAP at bedtime  . Cancer     waldenstroms/ macroglobinulemia  . Hypercholesteremia   . Hypothyroidism   . Depression   . Macroglobulinemia     ? POEMS syndrome  . Acid reflux     Past Surgical History  Procedure Laterality Date  . Cholecystectomy    . Back surgery  03/2004    herniation, L4-L5  . Bil laprascopic knee surgery    . Uterine fibroid embolization    . Bone marrow biopsy      2011  . Laparoscopic cholecystectomy  1997  . Foot surgery  1998  . Nasal sinus surgery  2004  . Ablation  09/2007    HTA and polyp resection  . Knee arthroscopy w/ meniscal repair  10/10 ; 3/11  . Bone marrow biopsy  4/14    Current Outpatient Prescriptions  Medication Sig Dispense Refill  . ALPRAZolam (XANAX) 0.5 MG tablet Take 0.5 mg by mouth at bedtime as needed for anxiety.       [name] aspirin 81 MG tablet Take 81 mg by mouth daily.      [name]  azelastine (ASTELIN) 137 MCG/SPRAY nasal spray Place 1 spray into the nose 2 (two) times daily. Use in each nostril as directed      . calcium-vitamin D 250-100 MG-UNIT per tablet Take 1 tablet by mouth 2 (two) times daily.      . cetirizine (ZYRTEC) 10 MG tablet Take 10 mg by mouth at bedtime.       . fexofenadine (ALLEGRA) 180 MG tablet Take 180 mg by mouth every morning.      . fluticasone (FLONASE) 50 MCG/ACT nasal spray Place 2 sprays into the nose daily.      . Fluticasone-Salmeterol (ADVAIR) 100-50 MCG/DOSE AEPB Inhale 2 puffs into the lungs every 12 (twelve) hours.      [name] ibuprofen (ADVIL,MOTRIN) 100 MG tablet Take 100 mg by mouth every 6 (six) hours as needed.      [name] levothyroxine (SYNTHROID, LEVOTHROID) 125 MCG tablet Take 125 mcg by mouth daily before breakfast.       . Multiple Vitamins-Minerals (MULTIVITAMIN PO) Take by mouth.      [name] omeprazole (PRILOSEC) 40 MG capsule Take 40 mg by mouth 2 (two) times daily.       [name]  pravastatin (PRAVACHOL) 40 MG tablet Take 40 mg by mouth every evening.       . sertraline (ZOLOFT) 100 MG tablet Take 100 mg by mouth every morning.       [name] UNABLE TO FIND Med Name: Allergy shots      . verapamil (CALAN-SR) 240 MG CR tablet Take 240 mg by mouth 2 (two) times daily.       No current facility-administered medications for this visit.    Family History  Problem Relation Age of Onset  . Heart disease Mother     heart cath  . Asthma Mother   . Endometriosis Mother     ROS:  Pertinent items are noted in HPI.  Otherwise, a comprehensive ROS was negative.  Exam:   BP 158/90  Pulse 60  Resp 25  Ht 5' 7.5" (1.715 m)  Wt 274 lb 3.2 oz (124.376 kg)  BMI 42.29 kg/m2  LMP 07/09/2011  Weight change: +4lb  Height: 5' 7.5" (171.5 cm)  Ht Readings from Last 3 Encounters:  11/21/12 5' 7.5" (1.715 m)  11/13/12 5\' 6"  (1.676 m)  05/08/12 5\' 8"  (1.727 m)    General appearance: alert, cooperative and appears stated age Head: Normocephalic, without obvious  abnormality, atraumatic Neck: no adenopathy, supple, symmetrical, trachea midline and thyroid normal to inspection and palpation Lungs: clear to auscultation bilaterally Breasts: normal appearance, no masses or tenderness Heart: regular rate and rhythm Abdomen: soft, non-tender; bowel sounds normal; no masses,  no organomegaly Extremities: extremities normal, atraumatic, no cyanosis or edema Skin: Skin color, texture, turgor normal. No rashes or lesions Lymph nodes: Cervical, supraclavicular, and axillary nodes normal. No abnormal inguinal nodes palpated Neurologic: Grossly normal   Pelvic: External genitalia:  no lesions              Urethra:  normal appearing urethra with no masses, tenderness or lesions              Bartholins and Skenes: normal                 Vagina: normal appearing vagina with normal color and discharge, no lesions              Cervix: no lesions              Pap taken: no Bimanual Exam:  Uterus:  normal size, contour, position, consistency, mobility, non-tender              Adnexa: normal adnexa and no mass, fullness, tenderness               Rectovaginal: Confirms               Anus:  normal sphincter tone, no lesions  A:  Well Woman with normal exam Perimenopausal with increased anxiety recently H/O HTA 9/09 Hypertension Elevated lipids POEMS syndrome  P:   Mammogram yearly.  Pt knows is due.  She wants to schedule pap smear with neg HR HPV 2013.  No pap today. Will increase zoloft to 150mg .  No rx needed but will call when does.  Wil give 3-50mg  tablets daily.  Will need 90 day supply. return annually or prn  An After Visit Summary was printed and given to the patient.

## 2012-11-21 NOTE — Patient Instructions (Signed)

## 2013-01-14 ENCOUNTER — Ambulatory Visit: Payer: Self-pay | Admitting: Obstetrics & Gynecology

## 2013-01-16 ENCOUNTER — Ambulatory Visit: Payer: Self-pay | Admitting: Obstetrics & Gynecology

## 2013-05-01 ENCOUNTER — Telehealth: Payer: Self-pay | Admitting: Internal Medicine

## 2013-05-01 ENCOUNTER — Other Ambulatory Visit: Payer: Self-pay | Admitting: Internal Medicine

## 2013-05-01 DIAGNOSIS — D472 Monoclonal gammopathy: Secondary | ICD-10-CM

## 2013-05-01 NOTE — Telephone Encounter (Signed)
returned pt call and lvm advised pt on May appts.....mailed pt appt sched and avs with letter

## 2013-05-20 ENCOUNTER — Other Ambulatory Visit (HOSPITAL_BASED_OUTPATIENT_CLINIC_OR_DEPARTMENT_OTHER): Payer: BC Managed Care – PPO

## 2013-05-20 DIAGNOSIS — D472 Monoclonal gammopathy: Secondary | ICD-10-CM

## 2013-05-20 LAB — COMPREHENSIVE METABOLIC PANEL (CC13)
ALT: 57 U/L — ABNORMAL HIGH (ref 0–55)
ANION GAP: 14 meq/L — AB (ref 3–11)
AST: 47 U/L — ABNORMAL HIGH (ref 5–34)
Albumin: 3.8 g/dL (ref 3.5–5.0)
Alkaline Phosphatase: 107 U/L (ref 40–150)
BILIRUBIN TOTAL: 0.44 mg/dL (ref 0.20–1.20)
BUN: 10.2 mg/dL (ref 7.0–26.0)
CO2: 20 meq/L — AB (ref 22–29)
Calcium: 9.7 mg/dL (ref 8.4–10.4)
Chloride: 104 mEq/L (ref 98–109)
Creatinine: 0.8 mg/dL (ref 0.6–1.1)
GLUCOSE: 195 mg/dL — AB (ref 70–140)
Potassium: 3.8 mEq/L (ref 3.5–5.1)
Sodium: 138 mEq/L (ref 136–145)
Total Protein: 7.5 g/dL (ref 6.4–8.3)

## 2013-05-20 LAB — CBC WITH DIFFERENTIAL/PLATELET
BASO%: 0.6 % (ref 0.0–2.0)
Basophils Absolute: 0 10*3/uL (ref 0.0–0.1)
EOS ABS: 0.1 10*3/uL (ref 0.0–0.5)
EOS%: 1.4 % (ref 0.0–7.0)
HCT: 34.6 % — ABNORMAL LOW (ref 34.8–46.6)
HEMOGLOBIN: 11.5 g/dL — AB (ref 11.6–15.9)
LYMPH%: 18.5 % (ref 14.0–49.7)
MCH: 30.2 pg (ref 25.1–34.0)
MCHC: 33.3 g/dL (ref 31.5–36.0)
MCV: 90.7 fL (ref 79.5–101.0)
MONO#: 0.3 10*3/uL (ref 0.1–0.9)
MONO%: 4.8 % (ref 0.0–14.0)
NEUT%: 74.7 % (ref 38.4–76.8)
NEUTROS ABS: 4.1 10*3/uL (ref 1.5–6.5)
PLATELETS: 193 10*3/uL (ref 145–400)
RBC: 3.82 10*6/uL (ref 3.70–5.45)
RDW: 13.9 % (ref 11.2–14.5)
WBC: 5.5 10*3/uL (ref 3.9–10.3)
lymph#: 1 10*3/uL (ref 0.9–3.3)

## 2013-05-20 LAB — LACTATE DEHYDROGENASE (CC13): LDH: 172 U/L (ref 125–245)

## 2013-05-22 LAB — KAPPA/LAMBDA LIGHT CHAINS
KAPPA FREE LGHT CHN: 0.68 mg/dL (ref 0.33–1.94)
Kappa:Lambda Ratio: 0.01 — ABNORMAL LOW (ref 0.26–1.65)
LAMBDA FREE LGHT CHN: 100 mg/dL — AB (ref 0.57–2.63)

## 2013-05-22 LAB — BETA 2 MICROGLOBULIN, SERUM: Beta-2 Microglobulin: 2.18 mg/L (ref <=2.51)

## 2013-05-22 LAB — IGG, IGA, IGM
IGA: 85 mg/dL (ref 69–380)
IGG (IMMUNOGLOBIN G), SERUM: 526 mg/dL — AB (ref 690–1700)
IgM, Serum: 1100 mg/dL — ABNORMAL HIGH (ref 52–322)

## 2013-05-25 ENCOUNTER — Telehealth: Payer: Self-pay | Admitting: Nurse Practitioner

## 2013-05-25 ENCOUNTER — Other Ambulatory Visit: Payer: Self-pay | Admitting: Orthopedic Surgery

## 2013-05-25 MED ORDER — SERTRALINE HCL 50 MG PO TABS: ORAL_TABLET | ORAL | Status: DC

## 2013-05-25 NOTE — Telephone Encounter (Signed)
(  new prescription) Patient is requesting a Zoloft (Generic)  prescription 150 mg. Patient spoke with Patty about this at her last appointment.

## 2013-05-25 NOTE — Telephone Encounter (Signed)
Spoke with pt about zoloft dosage. Pt saw SM for Aex 11-21-12 and has been taking 150 mg since then. Reports the dose has been working well for her. Pt had enough pills to get her through until now. Pt has been using 100 mg tabs and broke them in half to get the extra 50 mg. Pt is fine to continue doing this. OK to send in refill for 100 mg tabs (45 per month, total of 135 for 3 month supply?)

## 2013-05-25 NOTE — Telephone Encounter (Signed)
Sent to me in error - please look at refill request.

## 2013-05-26 ENCOUNTER — Other Ambulatory Visit: Payer: Self-pay | Admitting: Orthopedic Surgery

## 2013-05-27 ENCOUNTER — Encounter: Payer: Self-pay | Admitting: Internal Medicine

## 2013-05-27 ENCOUNTER — Telehealth: Payer: Self-pay | Admitting: Internal Medicine

## 2013-05-27 ENCOUNTER — Ambulatory Visit (HOSPITAL_BASED_OUTPATIENT_CLINIC_OR_DEPARTMENT_OTHER): Payer: BC Managed Care – PPO | Admitting: Internal Medicine

## 2013-05-27 VITALS — BP 144/87 | HR 69 | Temp 97.6°F | Resp 20 | Ht 67.5 in | Wt 275.5 lb

## 2013-05-27 DIAGNOSIS — E859 Amyloidosis, unspecified: Secondary | ICD-10-CM

## 2013-05-27 DIAGNOSIS — E8809 Other disorders of plasma-protein metabolism, not elsewhere classified: Secondary | ICD-10-CM

## 2013-05-27 DIAGNOSIS — D472 Monoclonal gammopathy: Secondary | ICD-10-CM

## 2013-05-27 NOTE — Progress Notes (Signed)
[facility] [phone]   Fax:([phone]) [phone]  OFFICE PROGRESS NOTE  [name], MD [place] [address]  DIAGNOSIS: MGUS diagnosed in September 2010, with additional symptoms suggestive of POEMS syndrome.   CURRENT THERAPY: Observation.  INTERVAL HISTORY: Brenda Conner 53 y.o. female returns to the clinic today for followup visit. The patient is feeling fine today with no specific complaints. She has some seasonal allergy recently. She denied having any significant weight loss or night sweats. The patient denied having any fever or chills. She has no chest pain, shortness of breath, cough or hemoptysis. She denied having any aching pain. She had repeat myeloma panel and she is here for evaluation and discussion of her lab results.  MEDICAL HISTORY: Past Medical History  Diagnosis Date  . PONV (postoperative nausea and vomiting)   . Hypertension   . Anxiety   . Depression   . Asthma   . Sleep apnea     CPAP at bedtime  . Cancer     waldenstroms/ macroglobinulemia  . Hypercholesteremia   . Hypothyroidism   . Depression   . Macroglobulinemia     ? POEMS syndrome  . Acid reflux     ALLERGIES:  is allergic to codeine; hydrocodone; lortab; and amoxicillin.  MEDICATIONS:  Current Outpatient Prescriptions  Medication Sig Dispense Refill  . ALPRAZolam (XANAX) 0.5 MG tablet Take 0.5 mg by mouth at bedtime as needed for anxiety.       [name] aspirin 81 MG tablet Take 81 mg by mouth daily.      [name] azelastine (ASTELIN) 137 MCG/SPRAY nasal spray Place 1 spray into the nose 2 (two) times daily. Use in each nostril as directed      . calcium-vitamin D 250-100 MG-UNIT per tablet Take 1 tablet by mouth 2 (two) times daily.      . cetirizine (ZYRTEC) 10 MG tablet Take 10 mg by mouth at bedtime.       . fexofenadine (ALLEGRA) 180 MG tablet Take 180 mg by mouth every morning.      . fluticasone (FLONASE) 50 MCG/ACT nasal spray Place 2 sprays  into the nose daily.      . Fluticasone-Salmeterol (ADVAIR) 100-50 MCG/DOSE AEPB Inhale 2 puffs into the lungs every 12 (twelve) hours.      [name] ibuprofen (ADVIL,MOTRIN) 100 MG tablet Take 100 mg by mouth every 6 (six) hours as needed.      [name] levothyroxine (SYNTHROID, LEVOTHROID) 125 MCG tablet Take 125 mcg by mouth daily before breakfast.       . Multiple Vitamins-Minerals (MULTIVITAMIN PO) Take by mouth.      [name] omeprazole (PRILOSEC) 40 MG capsule Take 40 mg by mouth 2 (two) times daily.       . pravastatin (PRAVACHOL) 40 MG tablet Take 40 mg by mouth every evening.       . sertraline (ZOLOFT) 50 MG tablet 3 tablets (13m) po daily  90 tablet  12  . UNABLE TO FIND Med Name: Allergy shots      . verapamil (CALAN-SR) 240 MG CR tablet Take 240 mg by mouth 2 (two) times daily.      .Marland KitchenVITAMIN D, CHOLECALCIFEROL, PO Take 4,000 Int'l Units by mouth daily.       No current facility-administered medications for this visit.    SURGICAL HISTORY:  Past Surgical History  Procedure Laterality Date  . Cholecystectomy    . Back surgery  03/2004  herniation, L4-L5  . Bil laprascopic knee surgery    . Uterine fibroid embolization    . Bone marrow biopsy      2011  . Laparoscopic cholecystectomy  1997  . Foot surgery  1998  . Nasal sinus surgery  2004  . Ablation  09/2007    HTA and polyp resection  . Knee arthroscopy w/ meniscal repair  10/10 ; 3/11  . Bone marrow biopsy  4/14    REVIEW OF SYSTEMS:  A comprehensive review of systems was negative.   PHYSICAL EXAMINATION: General appearance: alert, cooperative and no distress Head: Normocephalic, without obvious abnormality, atraumatic Neck: no adenopathy Lymph nodes: Cervical, supraclavicular, and axillary nodes normal. Resp: clear to auscultation bilaterally Cardio: regular rate and rhythm, S1, S2 normal, no murmur, click, rub or gallop GI: soft, non-tender; bowel sounds normal; no masses,  no organomegaly Extremities: extremities normal,  atraumatic, no cyanosis or edema  ECOG PERFORMANCE STATUS: 0 - Asymptomatic  Blood pressure 144/87, pulse 69, temperature 97.6 F (36.4 C), temperature source Oral, resp. rate 20, height 5' 7.5" (1.715 m), weight 275 lb 8 oz (124.966 kg).  LABORATORY DATA: Lab Results  Component Value Date   WBC 5.5 05/20/2013   HGB 11.5* 05/20/2013   HCT 34.6* 05/20/2013   MCV 90.7 05/20/2013   PLT 193 05/20/2013      Chemistry      Component Value Date/Time   NA 138 05/20/2013 1021   NA 137 03/20/2011 1509   K 3.8 05/20/2013 1021   K 3.4* 03/20/2011 1509   CL 104 04/07/2012 1415   CL 102 03/20/2011 1509   CO2 20* 05/20/2013 1021   CO2 26 03/20/2011 1509   BUN 10.2 05/20/2013 1021   BUN 7 03/20/2011 1509   CREATININE 0.8 05/20/2013 1021   CREATININE 0.63 03/20/2011 1509      Component Value Date/Time   CALCIUM 9.7 05/20/2013 1021   CALCIUM 9.2 03/20/2011 1509   ALKPHOS 107 05/20/2013 1021   ALKPHOS 74 03/20/2011 1509   AST 47* 05/20/2013 1021   AST 24 03/20/2011 1509   ALT 57* 05/20/2013 1021   ALT 26 03/20/2011 1509   BILITOT 0.44 05/20/2013 1021   BILITOT 0.3 03/20/2011 1509      ASSESSMENT AND PLAN: This is a very pleasant 53 years old white female with a monoclonal gammopathy in addition to peripheral neuropathy, and Endocrinopathy in the form of hypothyroidism and skin rash. The symptoms combined are suspicious for POEMS syndrome, but one of the other concerning diagnosis could be amyloidosis especially with the significant elevation of the free lambda light chain. She continues to have slow progression of her condition with increase and the free lambda light chain and IgM. I had a lengthy discussion with the patient today about her condition. I will order a few studies to rule out the possibility of amyloidosis. I will order an EKG to check the voltage of her waves as admitted with this is usually associated with low voltage EKG. I would also ordered a 2-D echo to check the thickness of her  ventricles. I also discussed with pathology performing [place] red stain was on her previous bone marrow biopsy. I would see the patient back for follow up visit in 2 months for reevaluation with repeat myeloma panel I discussed the lab results with the patient today. I recommended for her to continue on observation with repeat myeloma panel in 6 months. She was advised to call immediately if her symptoms started getting  worse as I may consider the patient for treatment with multiple myeloma regimen. The patient agreed to the current plan.  All questions were answered. The patient knows to call the clinic with any problems, questions or concerns. We can certainly see the patient much sooner if necessary.  Disclaimer: This note was dictated with voice recognition software. Similar sounding words can inadvertently be transcribed and may not be corrected upon review.

## 2013-05-27 NOTE — Telephone Encounter (Signed)
Refill done.  Encounter closed.  

## 2013-05-27 NOTE — Telephone Encounter (Signed)
Gave pt appt for lab and Md  also gave appt for @-d echo gave referral to [facility] for precert

## 2013-06-03 ENCOUNTER — Other Ambulatory Visit (HOSPITAL_COMMUNITY): Payer: BC Managed Care – PPO

## 2013-07-21 ENCOUNTER — Other Ambulatory Visit (HOSPITAL_BASED_OUTPATIENT_CLINIC_OR_DEPARTMENT_OTHER): Payer: BC Managed Care – PPO

## 2013-07-21 DIAGNOSIS — E859 Amyloidosis, unspecified: Secondary | ICD-10-CM

## 2013-07-21 DIAGNOSIS — D472 Monoclonal gammopathy: Secondary | ICD-10-CM

## 2013-07-21 DIAGNOSIS — E8809 Other disorders of plasma-protein metabolism, not elsewhere classified: Secondary | ICD-10-CM

## 2013-07-21 LAB — COMPREHENSIVE METABOLIC PANEL (CC13)
ALBUMIN: 3.6 g/dL (ref 3.5–5.0)
ALK PHOS: 83 U/L (ref 40–150)
ALT: 39 U/L (ref 0–55)
AST: 40 U/L — ABNORMAL HIGH (ref 5–34)
Anion Gap: 8 mEq/L (ref 3–11)
BUN: 9.9 mg/dL (ref 7.0–26.0)
CO2: 24 meq/L (ref 22–29)
Calcium: 9.2 mg/dL (ref 8.4–10.4)
Chloride: 106 mEq/L (ref 98–109)
Creatinine: 0.8 mg/dL (ref 0.6–1.1)
Glucose: 167 mg/dl — ABNORMAL HIGH (ref 70–140)
POTASSIUM: 3.6 meq/L (ref 3.5–5.1)
SODIUM: 138 meq/L (ref 136–145)
TOTAL PROTEIN: 7.2 g/dL (ref 6.4–8.3)
Total Bilirubin: 0.39 mg/dL (ref 0.20–1.20)

## 2013-07-21 LAB — CBC WITH DIFFERENTIAL/PLATELET
BASO%: 0.7 % (ref 0.0–2.0)
Basophils Absolute: 0 10*3/uL (ref 0.0–0.1)
EOS%: 1.2 % (ref 0.0–7.0)
Eosinophils Absolute: 0.1 10*3/uL (ref 0.0–0.5)
HCT: 34 % — ABNORMAL LOW (ref 34.8–46.6)
HGB: 11.3 g/dL — ABNORMAL LOW (ref 11.6–15.9)
LYMPH%: 17.4 % (ref 14.0–49.7)
MCH: 29.8 pg (ref 25.1–34.0)
MCHC: 33.3 g/dL (ref 31.5–36.0)
MCV: 89.7 fL (ref 79.5–101.0)
MONO#: 0.4 10*3/uL (ref 0.1–0.9)
MONO%: 5.7 % (ref 0.0–14.0)
NEUT#: 5.2 10*3/uL (ref 1.5–6.5)
NEUT%: 75 % (ref 38.4–76.8)
Platelets: 226 10*3/uL (ref 145–400)
RBC: 3.79 10*6/uL (ref 3.70–5.45)
RDW: 14.2 % (ref 11.2–14.5)
WBC: 6.9 10*3/uL (ref 3.9–10.3)
lymph#: 1.2 10*3/uL (ref 0.9–3.3)

## 2013-07-21 LAB — LACTATE DEHYDROGENASE (CC13): LDH: 168 U/L (ref 125–245)

## 2013-07-22 LAB — IGG, IGA, IGM
IGA: 97 mg/dL (ref 69–380)
IGM, SERUM: 1040 mg/dL — AB (ref 52–322)
IgG (Immunoglobin G), Serum: 478 mg/dL — ABNORMAL LOW (ref 690–1700)

## 2013-07-22 LAB — KAPPA/LAMBDA LIGHT CHAINS
KAPPA LAMBDA RATIO: 0 — AB (ref 0.26–1.65)
Kappa free light chain: 0.18 mg/dL — ABNORMAL LOW (ref 0.33–1.94)
Lambda Free Lght Chn: 104 mg/dL — ABNORMAL HIGH (ref 0.57–2.63)

## 2013-07-23 LAB — BETA 2 MICROGLOBULIN, SERUM: Beta-2 Microglobulin: 1.98 mg/L (ref <=2.51)

## 2013-07-27 ENCOUNTER — Encounter: Payer: Self-pay | Admitting: Internal Medicine

## 2013-07-27 ENCOUNTER — Ambulatory Visit (HOSPITAL_BASED_OUTPATIENT_CLINIC_OR_DEPARTMENT_OTHER): Payer: BC Managed Care – PPO | Admitting: Internal Medicine

## 2013-07-27 ENCOUNTER — Telehealth: Payer: Self-pay | Admitting: Internal Medicine

## 2013-07-27 VITALS — BP 150/82 | HR 64 | Temp 98.0°F | Resp 20 | Ht 67.5 in | Wt 275.5 lb

## 2013-07-27 DIAGNOSIS — D472 Monoclonal gammopathy: Secondary | ICD-10-CM

## 2013-07-27 DIAGNOSIS — R21 Rash and other nonspecific skin eruption: Secondary | ICD-10-CM

## 2013-07-27 DIAGNOSIS — E039 Hypothyroidism, unspecified: Secondary | ICD-10-CM

## 2013-07-27 DIAGNOSIS — E8809 Other disorders of plasma-protein metabolism, not elsewhere classified: Secondary | ICD-10-CM

## 2013-07-27 DIAGNOSIS — D649 Anemia, unspecified: Secondary | ICD-10-CM

## 2013-07-27 DIAGNOSIS — G609 Hereditary and idiopathic neuropathy, unspecified: Secondary | ICD-10-CM

## 2013-07-27 NOTE — Progress Notes (Signed)
[place] [phone]   [phone]  OFFICE PROGRESS NOTE  [name], MD [place] [address]  DIAGNOSIS: MGUS diagnosed in September 2010, with additional symptoms suggestive of POEMS syndrome.   CURRENT THERAPY: Observation.  INTERVAL HISTORY: Brenda Conner 53 y.o. female returns to the clinic today for followup visit. The patient is feeling fine today with no specific complaints. She was recently treated for urinary tract infection and bronchitis. She is feeling much better today. She denied having any significant weight loss or night sweats. The patient denied having any fever or chills. She has no chest pain, shortness of breath, cough or hemoptysis. I ask the pathology Department to test her bone marrow with [place] Red to rule out amyloidosis and this came back negative. She denied having any aching pain. She had repeat myeloma panel and she is here for evaluation and discussion of her lab results.  MEDICAL HISTORY: Past Medical History  Diagnosis Date  . PONV (postoperative nausea and vomiting)   . Hypertension   . Anxiety   . Depression   . Asthma   . Sleep apnea     CPAP at bedtime  . Cancer     waldenstroms/ macroglobinulemia  . Hypercholesteremia   . Hypothyroidism   . Depression   . Macroglobulinemia     ? POEMS syndrome  . Acid reflux     ALLERGIES:  is allergic to codeine; hydrocodone; lortab; and amoxicillin.  MEDICATIONS:  Current Outpatient Prescriptions  Medication Sig Dispense Refill  . ALPRAZolam (XANAX) 0.5 MG tablet Take 0.5 mg by mouth at bedtime as needed for anxiety.       [name] aspirin 81 MG tablet Take 81 mg by mouth daily.      [name] azelastine (ASTELIN) 137 MCG/SPRAY nasal spray Place 1 spray into the nose 2 (two) times daily. Use in each nostril as directed      . calcium-vitamin D 250-100 MG-UNIT per tablet Take 1 tablet by mouth 2 (two) times daily.      . cetirizine (ZYRTEC) 10 MG  tablet Take 10 mg by mouth at bedtime.       . fexofenadine (ALLEGRA) 180 MG tablet Take 180 mg by mouth every morning.      . fluticasone (FLONASE) 50 MCG/ACT nasal spray Place 2 sprays into the nose daily.      . Fluticasone-Salmeterol (ADVAIR) 100-50 MCG/DOSE AEPB Inhale 2 puffs into the lungs every 12 (twelve) hours.      [name] ibuprofen (ADVIL,MOTRIN) 100 MG tablet Take 100 mg by mouth every 6 (six) hours as needed.      [name] levothyroxine (SYNTHROID, LEVOTHROID) 125 MCG tablet Take 125 mcg by mouth daily before breakfast.       . Multiple Vitamins-Minerals (MULTIVITAMIN PO) Take by mouth.      [name] omeprazole (PRILOSEC) 40 MG capsule Take 40 mg by mouth 2 (two) times daily.       . pravastatin (PRAVACHOL) 40 MG tablet Take 40 mg by mouth every evening.       . sertraline (ZOLOFT) 50 MG tablet 3 tablets (15m) po daily  90 tablet  12  . UNABLE TO FIND Med Name: Allergy shots      . verapamil (CALAN-SR) 240 MG CR tablet Take 240 mg by mouth 2 (two) times daily.      .Marland KitchenVITAMIN D, CHOLECALCIFEROL, PO Take 4,000 Int'l Units by mouth daily.  No current facility-administered medications for this visit.    SURGICAL HISTORY:  Past Surgical History  Procedure Laterality Date  . Cholecystectomy    . Back surgery  03/2004    herniation, L4-L5  . Bil laprascopic knee surgery    . Uterine fibroid embolization    . Bone marrow biopsy      2011  . Laparoscopic cholecystectomy  1997  . Foot surgery  1998  . Nasal sinus surgery  2004  . Ablation  09/2007    HTA and polyp resection  . Knee arthroscopy w/ meniscal repair  10/10 ; 3/11  . Bone marrow biopsy  4/14    REVIEW OF SYSTEMS:  A comprehensive review of systems was negative.   PHYSICAL EXAMINATION: General appearance: alert, cooperative and no distress Head: Normocephalic, without obvious abnormality, atraumatic Neck: no adenopathy Lymph nodes: Cervical, supraclavicular, and axillary nodes normal. Resp: clear to auscultation  bilaterally Cardio: regular rate and rhythm, S1, S2 normal, no murmur, click, rub or gallop GI: soft, non-tender; bowel sounds normal; no masses,  no organomegaly Extremities: extremities normal, atraumatic, no cyanosis or edema  ECOG PERFORMANCE STATUS: 0 - Asymptomatic  Blood pressure 150/82, pulse 64, temperature 98 F (36.7 C), temperature source Oral, resp. rate 20, height 5' 7.5" (1.715 m), weight 275 lb 8 oz (124.966 kg), SpO2 99.00%.  LABORATORY DATA: Lab Results  Component Value Date   WBC 6.9 07/21/2013   HGB 11.3* 07/21/2013   HCT 34.0* 07/21/2013   MCV 89.7 07/21/2013   PLT 226 07/21/2013      Chemistry      Component Value Date/Time   NA 138 07/21/2013 1344   NA 137 03/20/2011 1509   K 3.6 07/21/2013 1344   K 3.4* 03/20/2011 1509   CL 104 04/07/2012 1415   CL 102 03/20/2011 1509   CO2 24 07/21/2013 1344   CO2 26 03/20/2011 1509   BUN 9.9 07/21/2013 1344   BUN 7 03/20/2011 1509   CREATININE 0.8 07/21/2013 1344   CREATININE 0.63 03/20/2011 1509      Component Value Date/Time   CALCIUM 9.2 07/21/2013 1344   CALCIUM 9.2 03/20/2011 1509   ALKPHOS 83 07/21/2013 1344   ALKPHOS 74 03/20/2011 1509   AST 40* 07/21/2013 1344   AST 24 03/20/2011 1509   ALT 39 07/21/2013 1344   ALT 26 03/20/2011 1509   BILITOT 0.39 07/21/2013 1344   BILITOT 0.3 03/20/2011 1509     Other lab results: Beta-2 microglobulin 1.98, IgG 478, IgA 97 and IgM 1040. Kappa chain 0.18, free lambda light chain 104.00, kappa/lambda ratio 0.00.  ASSESSMENT AND PLAN: This is a very pleasant 53 years old white female with a monoclonal gammopathy in addition to peripheral neuropathy, and Endocrinopathy in the form of hypothyroidism and skin rash. The symptoms combined are suspicious for POEMS syndrome, but one of the other concerning diagnosis could be amyloidosis especially with the significant elevation of the free lambda light chain. She continues to have mild slow progression of her condition with increase and the free  lambda light chain and IgM. The patient is currently asymptomatic with no evidence for renal insufficiency but she has mild anemia. I discussed the lab result with the patient today and recommended for her to continue on observation for now. I also gave her the option of starting some form of treatment for the questionable indolent multiple myeloma. She would like to continue on observation. I would see the patient back for follow up visit in  3 months for reevaluation with repeat myeloma panel I discussed the lab results with the patient today. I recommended for her to continue on observation with repeat myeloma panel in 6 months. She was advised to call immediately if her symptoms started getting worse as I may consider the patient for treatment with multiple myeloma regimen. The patient agreed to the current plan.  All questions were answered. The patient knows to call the clinic with any problems, questions or concerns. We can certainly see the patient much sooner if necessary.  Disclaimer: This note was dictated with voice recognition software. Similar sounding words can inadvertently be transcribed and may not be corrected upon review.

## 2013-07-27 NOTE — Telephone Encounter (Signed)
Pt confirmed labs/ov per 07/20 POF, gave pt AVS....KJ °

## 2013-09-07 ENCOUNTER — Telehealth: Payer: Self-pay | Admitting: Internal Medicine

## 2013-09-07 NOTE — Telephone Encounter (Signed)
pt called to r/s appt ....done ...pt ok adn aware of new d.t °

## 2013-10-23 ENCOUNTER — Other Ambulatory Visit: Payer: Self-pay

## 2013-10-27 ENCOUNTER — Ambulatory Visit: Payer: BC Managed Care – PPO | Admitting: Internal Medicine

## 2013-10-27 ENCOUNTER — Other Ambulatory Visit: Payer: BC Managed Care – PPO

## 2013-11-04 ENCOUNTER — Ambulatory Visit: Payer: BC Managed Care – PPO | Admitting: Internal Medicine

## 2013-11-11 ENCOUNTER — Other Ambulatory Visit (HOSPITAL_BASED_OUTPATIENT_CLINIC_OR_DEPARTMENT_OTHER): Payer: BC Managed Care – PPO

## 2013-11-11 DIAGNOSIS — D472 Monoclonal gammopathy: Secondary | ICD-10-CM

## 2013-11-11 DIAGNOSIS — E8809 Other disorders of plasma-protein metabolism, not elsewhere classified: Secondary | ICD-10-CM

## 2013-11-11 LAB — CBC WITH DIFFERENTIAL/PLATELET
BASO%: 0.7 % (ref 0.0–2.0)
Basophils Absolute: 0 10*3/uL (ref 0.0–0.1)
EOS%: 2.2 % (ref 0.0–7.0)
Eosinophils Absolute: 0.1 10*3/uL (ref 0.0–0.5)
HCT: 34.9 % (ref 34.8–46.6)
HGB: 11.3 g/dL — ABNORMAL LOW (ref 11.6–15.9)
LYMPH%: 18.5 % (ref 14.0–49.7)
MCH: 28.3 pg (ref 25.1–34.0)
MCHC: 32.5 g/dL (ref 31.5–36.0)
MCV: 87.3 fL (ref 79.5–101.0)
MONO#: 0.4 10*3/uL (ref 0.1–0.9)
MONO%: 6.2 % (ref 0.0–14.0)
NEUT#: 4.1 10*3/uL (ref 1.5–6.5)
NEUT%: 72.4 % (ref 38.4–76.8)
Platelets: 201 10*3/uL (ref 145–400)
RBC: 4 10*6/uL (ref 3.70–5.45)
RDW: 13.9 % (ref 11.2–14.5)
WBC: 5.7 10*3/uL (ref 3.9–10.3)
lymph#: 1.1 10*3/uL (ref 0.9–3.3)

## 2013-11-11 LAB — COMPREHENSIVE METABOLIC PANEL (CC13)
ALK PHOS: 92 U/L (ref 40–150)
ALT: 47 U/L (ref 0–55)
AST: 44 U/L — ABNORMAL HIGH (ref 5–34)
Albumin: 4 g/dL (ref 3.5–5.0)
Anion Gap: 10 mEq/L (ref 3–11)
BILIRUBIN TOTAL: 0.43 mg/dL (ref 0.20–1.20)
BUN: 10.1 mg/dL (ref 7.0–26.0)
CO2: 25 meq/L (ref 22–29)
CREATININE: 0.8 mg/dL (ref 0.6–1.1)
Calcium: 9.8 mg/dL (ref 8.4–10.4)
Chloride: 104 mEq/L (ref 98–109)
Glucose: 119 mg/dl (ref 70–140)
Potassium: 3.9 mEq/L (ref 3.5–5.1)
SODIUM: 139 meq/L (ref 136–145)
Total Protein: 7.5 g/dL (ref 6.4–8.3)

## 2013-11-11 LAB — LACTATE DEHYDROGENASE (CC13): LDH: 160 U/L (ref 125–245)

## 2013-11-13 LAB — IGG, IGA, IGM
IGA: 98 mg/dL (ref 69–380)
IgG (Immunoglobin G), Serum: 534 mg/dL — ABNORMAL LOW (ref 690–1700)
IgM, Serum: 1080 mg/dL — ABNORMAL HIGH (ref 52–322)

## 2013-11-13 LAB — KAPPA/LAMBDA LIGHT CHAINS
KAPPA LAMBDA RATIO: 0.01 — AB (ref 0.26–1.65)
Kappa free light chain: 0.87 mg/dL (ref 0.33–1.94)
LAMBDA FREE LGHT CHN: 126 mg/dL — AB (ref 0.57–2.63)

## 2013-11-13 LAB — BETA 2 MICROGLOBULIN, SERUM: Beta-2 Microglobulin: 2.56 mg/L — ABNORMAL HIGH (ref <=2.51)

## 2013-11-18 ENCOUNTER — Ambulatory Visit (HOSPITAL_BASED_OUTPATIENT_CLINIC_OR_DEPARTMENT_OTHER): Payer: BC Managed Care – PPO | Admitting: Internal Medicine

## 2013-11-18 ENCOUNTER — Telehealth: Payer: Self-pay | Admitting: Internal Medicine

## 2013-11-18 ENCOUNTER — Encounter: Payer: Self-pay | Admitting: Internal Medicine

## 2013-11-18 VITALS — BP 160/78 | HR 61 | Temp 97.8°F | Resp 17 | Ht 67.5 in | Wt 273.5 lb

## 2013-11-18 DIAGNOSIS — C9000 Multiple myeloma not having achieved remission: Secondary | ICD-10-CM | POA: Insufficient documentation

## 2013-11-18 MED ORDER — DEXAMETHASONE 4 MG PO TABS: ORAL_TABLET | ORAL | Status: DC

## 2013-11-18 NOTE — Telephone Encounter (Signed)
, °

## 2013-11-18 NOTE — Progress Notes (Signed)
[place] [phone]   [phone]  OFFICE PROGRESS NOTE  [name], MD [place] [address]  DIAGNOSIS: MGUS diagnosed in September 2010, with additional symptoms suggestive of POEMS syndrome.   CURRENT THERAPY: Velcade 1.3 MG/M2 subcutaneously with Decadron 40 mg by mouth on a weekly basis. First cycle 11/24/2013.  INTERVAL HISTORY: Brenda Conner 53 y.o. female returns to the clinic today for followup visit. The patient is feeling fine today with no specific complaints. She denied having any significant weight loss or night sweats. The patient denied having any fever or chills. She has no chest pain, shortness of breath, cough or hemoptysis. She denied having any aching pain. She had repeat myeloma panel and she is here for evaluation and discussion of her lab results.  MEDICAL HISTORY: Past Medical History  Diagnosis Date  . PONV (postoperative nausea and vomiting)   . Hypertension   . Anxiety   . Depression   . Asthma   . Sleep apnea     CPAP at bedtime  . Cancer     waldenstroms/ macroglobinulemia  . Hypercholesteremia   . Hypothyroidism   . Depression   . Macroglobulinemia     ? POEMS syndrome  . Acid reflux     ALLERGIES:  is allergic to codeine; hydrocodone; lortab; and amoxicillin.  MEDICATIONS:  Current Outpatient Prescriptions  Medication Sig Dispense Refill  . ALPRAZolam (XANAX) 0.5 MG tablet Take 0.5 mg by mouth at bedtime as needed for anxiety.     [name] aspirin 81 MG tablet Take 81 mg by mouth daily.    [name] azelastine (ASTELIN) 137 MCG/SPRAY nasal spray Place 1 spray into the nose 2 (two) times daily. Use in each nostril as directed    . calcium-vitamin D 250-100 MG-UNIT per tablet Take 1 tablet by mouth 2 (two) times daily.    . cetirizine (ZYRTEC) 10 MG tablet Take 10 mg by mouth at bedtime.     . fexofenadine (ALLEGRA) 180 MG tablet Take 180 mg by mouth every morning.    . fluticasone  (FLONASE) 50 MCG/ACT nasal spray Place 2 sprays into the nose daily.    . Fluticasone-Salmeterol (ADVAIR) 100-50 MCG/DOSE AEPB Inhale 2 puffs into the lungs every 12 (twelve) hours.    [name] ibuprofen (ADVIL,MOTRIN) 100 MG tablet Take 100 mg by mouth every 6 (six) hours as needed.    [name] levothyroxine (SYNTHROID, LEVOTHROID) 125 MCG tablet Take 125 mcg by mouth daily before breakfast.     . Multiple Vitamins-Minerals (MULTIVITAMIN PO) Take by mouth.    [name] omeprazole (PRILOSEC) 40 MG capsule Take 40 mg by mouth 2 (two) times daily.     . pravastatin (PRAVACHOL) 40 MG tablet Take 40 mg by mouth every evening.     . sertraline (ZOLOFT) 50 MG tablet 3 tablets (164m) po daily 90 tablet 12  . UNABLE TO FIND Med Name: Allergy shots    . verapamil (CALAN-SR) 240 MG CR tablet Take 240 mg by mouth 2 (two) times daily.    .Marland KitchenVITAMIN D, CHOLECALCIFEROL, PO Take 4,000 Int'l Units by mouth daily.     No current facility-administered medications for this visit.    SURGICAL HISTORY:  Past Surgical History  Procedure Laterality Date  . Cholecystectomy    . Back surgery  03/2004    herniation, L4-L5  . Bil laprascopic knee surgery    . Uterine fibroid embolization    . Bone marrow biopsy  2011  . Laparoscopic cholecystectomy  1997  . Foot surgery  1998  . Nasal sinus surgery  2004  . Ablation  09/2007    HTA and polyp resection  . Knee arthroscopy w/ meniscal repair  10/10 ; 3/11  . Bone marrow biopsy  4/14    REVIEW OF SYSTEMS:  A comprehensive review of systems was negative.   PHYSICAL EXAMINATION: General appearance: alert, cooperative and no distress Head: Normocephalic, without obvious abnormality, atraumatic Neck: no adenopathy Lymph nodes: Cervical, supraclavicular, and axillary nodes normal. Resp: clear to auscultation bilaterally Cardio: regular rate and rhythm, S1, S2 normal, no murmur, click, rub or gallop GI: soft, non-tender; bowel sounds normal; no masses,  no  organomegaly Extremities: extremities normal, atraumatic, no cyanosis or edema  ECOG PERFORMANCE STATUS: 0 - Asymptomatic  Blood pressure 160/78, pulse 61, temperature 97.8 F (36.6 C), temperature source Oral, resp. rate 17, height 5' 7.5" (1.715 m), weight 273 lb 8 oz (124.059 kg).  LABORATORY DATA: Lab Results  Component Value Date   WBC 5.7 11/11/2013   HGB 11.3* 11/11/2013   HCT 34.9 11/11/2013   MCV 87.3 11/11/2013   PLT 201 11/11/2013      Chemistry      Component Value Date/Time   NA 139 11/11/2013 0820   NA 137 03/20/2011 1509   K 3.9 11/11/2013 0820   K 3.4* 03/20/2011 1509   CL 104 04/07/2012 1415   CL 102 03/20/2011 1509   CO2 25 11/11/2013 0820   CO2 26 03/20/2011 1509   BUN 10.1 11/11/2013 0820   BUN 7 03/20/2011 1509   CREATININE 0.8 11/11/2013 0820   CREATININE 0.63 03/20/2011 1509      Component Value Date/Time   CALCIUM 9.8 11/11/2013 0820   CALCIUM 9.2 03/20/2011 1509   ALKPHOS 92 11/11/2013 0820   ALKPHOS 74 03/20/2011 1509   AST 44* 11/11/2013 0820   AST 24 03/20/2011 1509   ALT 47 11/11/2013 0820   ALT 26 03/20/2011 1509   BILITOT 0.43 11/11/2013 0820   BILITOT 0.3 03/20/2011 1509     Other lab results: Beta-2 microglobulin 2.56, IgG 534, IgA 98 and IgM 1080. Kappa chain 0.87, free lambda light chain 126.00, kappa/lambda ratio 0.01.  ASSESSMENT AND PLAN: This is a very pleasant 53 years old white female with a multiple myeloma initially diagnosed as monoclonal gammopathy in addition to peripheral neuropathy, and Endocrinopathy in the form of hypothyroidism and skin rash. The symptoms combined are suspicious for POEMS syndrome.  She continues to have slow progression of her condition with increase and the free lambda light chain and IgM. I discussed the lab result with the patient today  I had a lengthy discussion with the patient today about her current disease status and treatment options. I gave her the option of continuous observation and  close monitoring versus consideration of treatment with subcutaneous Velcade 1.3 MG/M2 weekly with Decadron 40 mg by mouth on a weekly basis. She is interested in proceeding with chemotherapy. I discussed with the patient adverse effect of this treatment including but not limited to mild alopecia, peripheral neuropathy, nausea and vomiting, myelosuppression. She is expected to start the first cycle of this treatment next week. She would have a chemotherapy education class before starting the first cycle of her treatment She will come back for follow-up visit in 3 weeks for reevaluation and management of any adverse effect of her treatment.  She was advised to call immediately if her symptoms started getting worse  as I may consider the patient for treatment with multiple myeloma regimen. The patient agreed to the current plan.  All questions were answered. The patient knows to call the clinic with any problems, questions or concerns. We can certainly see the patient much sooner if necessary.  Disclaimer: This note was dictated with voice recognition software. Similar sounding words can inadvertently be transcribed and may not be corrected upon review.

## 2013-11-18 NOTE — Telephone Encounter (Signed)
gv and printed appt sched and avs for pt for NOV thru Feb 2016....sed added tx....emailed Dr. Tyrell Antonio to add MD visit

## 2013-11-20 ENCOUNTER — Other Ambulatory Visit: Payer: Self-pay | Admitting: Medical Oncology

## 2013-11-20 ENCOUNTER — Other Ambulatory Visit: Payer: BC Managed Care – PPO

## 2013-11-20 DIAGNOSIS — C9000 Multiple myeloma not having achieved remission: Secondary | ICD-10-CM

## 2013-11-20 MED ORDER — PROCHLORPERAZINE MALEATE 10 MG PO TABS: 10.0000 mg | ORAL_TABLET | Freq: Four times a day (QID) | ORAL | Status: DC | PRN

## 2013-11-20 NOTE — Progress Notes (Signed)
RX for Compazine prn for nausea,vomiting sent to [facility].

## 2013-11-23 ENCOUNTER — Encounter: Payer: Self-pay | Admitting: Internal Medicine

## 2013-11-23 ENCOUNTER — Telehealth: Payer: Self-pay | Admitting: Internal Medicine

## 2013-11-23 ENCOUNTER — Telehealth: Payer: Self-pay | Admitting: Medical Oncology

## 2013-11-23 ENCOUNTER — Other Ambulatory Visit: Payer: Self-pay | Admitting: Internal Medicine

## 2013-11-23 NOTE — Telephone Encounter (Signed)
Pt cancelled chemo tomorrow. due to illness and on antibiotic. She will come in next week to start chemo.Onc Tx request sent

## 2013-11-23 NOTE — Telephone Encounter (Signed)
s.w pt and advised on cx appt going to start tx nxt week....pt ok adn aware

## 2013-11-24 ENCOUNTER — Other Ambulatory Visit: Payer: BC Managed Care – PPO

## 2013-11-24 ENCOUNTER — Ambulatory Visit: Payer: BC Managed Care – PPO

## 2013-12-01 ENCOUNTER — Other Ambulatory Visit: Payer: Self-pay | Admitting: *Deleted

## 2013-12-01 ENCOUNTER — Ambulatory Visit (HOSPITAL_BASED_OUTPATIENT_CLINIC_OR_DEPARTMENT_OTHER): Payer: BC Managed Care – PPO

## 2013-12-01 ENCOUNTER — Telehealth: Payer: Self-pay | Admitting: *Deleted

## 2013-12-01 ENCOUNTER — Other Ambulatory Visit (HOSPITAL_BASED_OUTPATIENT_CLINIC_OR_DEPARTMENT_OTHER): Payer: BC Managed Care – PPO

## 2013-12-01 DIAGNOSIS — C9000 Multiple myeloma not having achieved remission: Secondary | ICD-10-CM

## 2013-12-01 DIAGNOSIS — Z5112 Encounter for antineoplastic immunotherapy: Secondary | ICD-10-CM

## 2013-12-01 LAB — CBC WITH DIFFERENTIAL/PLATELET
BASO%: 0.3 % (ref 0.0–2.0)
Basophils Absolute: 0 10*3/uL (ref 0.0–0.1)
EOS%: 2.8 % (ref 0.0–7.0)
Eosinophils Absolute: 0.2 10*3/uL (ref 0.0–0.5)
HCT: 34.7 % — ABNORMAL LOW (ref 34.8–46.6)
HGB: 11.4 g/dL — ABNORMAL LOW (ref 11.6–15.9)
LYMPH#: 1.4 10*3/uL (ref 0.9–3.3)
LYMPH%: 19.7 % (ref 14.0–49.7)
MCH: 28.9 pg (ref 25.1–34.0)
MCHC: 32.9 g/dL (ref 31.5–36.0)
MCV: 87.8 fL (ref 79.5–101.0)
MONO#: 0.5 10*3/uL (ref 0.1–0.9)
MONO%: 6.3 % (ref 0.0–14.0)
NEUT#: 5.2 10*3/uL (ref 1.5–6.5)
NEUT%: 70.9 % (ref 38.4–76.8)
Platelets: 189 10*3/uL (ref 145–400)
RBC: 3.95 10*6/uL (ref 3.70–5.45)
RDW: 14 % (ref 11.2–14.5)
WBC: 7.3 10*3/uL (ref 3.9–10.3)

## 2013-12-01 LAB — COMPREHENSIVE METABOLIC PANEL (CC13)
ALBUMIN: 3.9 g/dL (ref 3.5–5.0)
ALT: 47 U/L (ref 0–55)
AST: 48 U/L — AB (ref 5–34)
Alkaline Phosphatase: 101 U/L (ref 40–150)
Anion Gap: 11 mEq/L (ref 3–11)
BUN: 10.3 mg/dL (ref 7.0–26.0)
CALCIUM: 9.7 mg/dL (ref 8.4–10.4)
CHLORIDE: 104 meq/L (ref 98–109)
CO2: 23 mEq/L (ref 22–29)
Creatinine: 0.8 mg/dL (ref 0.6–1.1)
Glucose: 163 mg/dl — ABNORMAL HIGH (ref 70–140)
POTASSIUM: 3.9 meq/L (ref 3.5–5.1)
Sodium: 137 mEq/L (ref 136–145)
Total Bilirubin: 0.44 mg/dL (ref 0.20–1.20)
Total Protein: 7.5 g/dL (ref 6.4–8.3)

## 2013-12-01 MED ORDER — ACYCLOVIR 400 MG PO TABS: 400.0000 mg | ORAL_TABLET | Freq: Two times a day (BID) | ORAL | Status: DC

## 2013-12-01 MED ORDER — ONDANSETRON HCL 8 MG PO TABS
ORAL_TABLET | ORAL | Status: AC
  Filled 2013-12-01: qty 1

## 2013-12-01 MED ORDER — BORTEZOMIB CHEMO SQ INJECTION 3.5 MG (2.5MG/ML)
1.3000 mg/m2 | Freq: Once | INTRAMUSCULAR | Status: AC
Start: 2013-12-01 — End: 2013-12-01
  Administered 2013-12-01: 3.25 mg via SUBCUTANEOUS
  Filled 2013-12-01: qty 3.25

## 2013-12-01 MED ORDER — ONDANSETRON HCL 8 MG PO TABS
8.0000 mg | ORAL_TABLET | Freq: Once | ORAL | Status: AC
  Administered 2013-12-01: 8 mg via ORAL

## 2013-12-01 NOTE — Telephone Encounter (Signed)
Per Dr Vista Mink, pt needs to be on acyclovir 400mg  BID prophylaxis.  Called and left a message on cell ph vm with information.

## 2013-12-01 NOTE — Progress Notes (Signed)
VSS. patient has tolerated 1st velcade injection well. Discharged ambulatory in no acute distress. AVS/appts discussed with patient.

## 2013-12-01 NOTE — Patient Instructions (Signed)
[facility] Discharge Instructions for Patients Receiving Chemotherapy  Today you received the following chemotherapy agents: Velcade  To help prevent nausea and vomiting after your treatment, we encourage you to take your nausea medication: Compazine 10 mg every 6 hrs as needed.   If you develop nausea and vomiting that is not controlled by your nausea medication, call the clinic.   BELOW ARE SYMPTOMS THAT SHOULD BE REPORTED IMMEDIATELY:  *FEVER GREATER THAN 100.5 F  *CHILLS WITH OR WITHOUT FEVER  NAUSEA AND VOMITING THAT IS NOT CONTROLLED WITH YOUR NAUSEA MEDICATION  *UNUSUAL SHORTNESS OF BREATH  *UNUSUAL BRUISING OR BLEEDING  TENDERNESS IN MOUTH AND THROAT WITH OR WITHOUT PRESENCE OF ULCERS  *URINARY PROBLEMS  *BOWEL PROBLEMS  UNUSUAL RASH Items with * indicate a potential emergency and should be followed up as soon as possible.  Feel free to call the clinic you have any questions or concerns. The clinic phone number is [phone].  Bortezomib injection (Velcade) What is this medicine? BORTEZOMIB (bor TEZ oh mib) is a chemotherapy drug. It slows the growth of cancer cells. This medicine is used to treat multiple myeloma, and certain lymphomas, such as mantle-cell lymphoma. This medicine may be used for other purposes; ask your health care provider or pharmacist if you have questions. COMMON BRAND NAME(S): Velcade What should I tell my health care provider before I take this medicine? They need to know if you have any of these conditions: -diabetes -heart disease -irregular heartbeat -liver disease -on hemodialysis -low blood counts, like low white blood cells, platelets, or hemoglobin -peripheral neuropathy -taking medicine for blood pressure -an unusual or allergic reaction to bortezomib, mannitol, boron, other medicines, foods, dyes, or preservatives -pregnant or trying to get pregnant -breast-feeding How should I use this medicine? This  medicine is for injection into a vein or for injection under the skin. It is given by a health care professional in a hospital or clinic setting. Talk to your pediatrician regarding the use of this medicine in children. Special care may be needed. Overdosage: If you think you have taken too much of this medicine contact a poison control center or emergency room at once. NOTE: This medicine is only for you. Do not share this medicine with others. What if I miss a dose? It is important not to miss your dose. Call your doctor or health care professional if you are unable to keep an appointment. What may interact with this medicine? This medicine may interact with the following medications: -ketoconazole -rifampin -ritonavir -St. John's Wort This list may not describe all possible interactions. Give your health care provider a list of all the medicines, herbs, non-prescription drugs, or dietary supplements you use. Also tell them if you smoke, drink alcohol, or use illegal drugs. Some items may interact with your medicine. What should I watch for while using this medicine? Visit your doctor for checks on your progress. This drug may make you feel generally unwell. This is not uncommon, as chemotherapy can affect healthy cells as well as cancer cells. Report any side effects. Continue your course of treatment even though you feel ill unless your doctor tells you to stop. You may get drowsy or dizzy. Do not drive, use machinery, or do anything that needs mental alertness until you know how this medicine affects you. Do not stand or sit up quickly, especially if you are an older patient. This reduces the risk of dizzy or fainting spells. In some cases, you may be given  additional medicines to help with side effects. Follow all directions for their use. Call your doctor or health care professional for advice if you get a fever, chills or sore throat, or other symptoms of a cold or flu. Do not treat  yourself. This drug decreases your body's ability to fight infections. Try to avoid being around people who are sick. This medicine may increase your risk to bruise or bleed. Call your doctor or health care professional if you notice any unusual bleeding. You may need blood work done while you are taking this medicine. In some patients, this medicine may cause a serious brain infection that may cause death. If you have any problems seeing, thinking, speaking, walking, or standing, tell your doctor right away. If you cannot reach your doctor, urgently seek other source of medical care. Do not become pregnant while taking this medicine. Women should inform their doctor if they wish to become pregnant or think they might be pregnant. There is a potential for serious side effects to an unborn child. Talk to your health care professional or pharmacist for more information. Do not breast-feed an infant while taking this medicine. Check with your doctor or health care professional if you get an attack of severe diarrhea, nausea and vomiting, or if you sweat a lot. The loss of too much body fluid can make it dangerous for you to take this medicine. What side effects may I notice from receiving this medicine? Side effects that you should report to your doctor or health care professional as soon as possible: -allergic reactions like skin rash, itching or hives, swelling of the face, lips, or tongue -breathing problems -changes in hearing -changes in vision -fast, irregular heartbeat -feeling faint or lightheaded, falls -pain, tingling, numbness in the hands or feet -right upper belly pain -seizures -swelling of the ankles, feet, hands -unusual bleeding or bruising -unusually weak or tired -vomiting -yellowing of the eyes or skin Side effects that usually do not require medical attention (report to your doctor or health care professional if they continue or are bothersome): -changes in emotions or  moods -constipation -diarrhea -loss of appetite -headache -irritation at site where injected -nausea This list may not describe all possible side effects. Call your doctor for medical advice about side effects. You may report side effects to FDA at 1-800-FDA-1088. Where should I keep my medicine? This drug is given in a hospital or clinic and will not be stored at home. NOTE: This sheet is a summary. It may not cover all possible information. If you have questions about this medicine, talk to your doctor, pharmacist, or health care provider.  2015, [employer] Standard. (2012-10-20 12:46:32)

## 2013-12-08 ENCOUNTER — Ambulatory Visit (HOSPITAL_BASED_OUTPATIENT_CLINIC_OR_DEPARTMENT_OTHER): Payer: BC Managed Care – PPO

## 2013-12-08 ENCOUNTER — Ambulatory Visit (HOSPITAL_BASED_OUTPATIENT_CLINIC_OR_DEPARTMENT_OTHER): Payer: BC Managed Care – PPO | Admitting: Physician Assistant

## 2013-12-08 ENCOUNTER — Other Ambulatory Visit (HOSPITAL_BASED_OUTPATIENT_CLINIC_OR_DEPARTMENT_OTHER): Payer: BC Managed Care – PPO

## 2013-12-08 ENCOUNTER — Other Ambulatory Visit: Payer: BC Managed Care – PPO

## 2013-12-08 ENCOUNTER — Ambulatory Visit: Payer: BC Managed Care – PPO

## 2013-12-08 ENCOUNTER — Encounter: Payer: Self-pay | Admitting: Physician Assistant

## 2013-12-08 VITALS — BP 139/71 | HR 73 | Temp 97.9°F | Resp 18 | Ht 67.5 in | Wt 271.0 lb

## 2013-12-08 DIAGNOSIS — C9000 Multiple myeloma not having achieved remission: Secondary | ICD-10-CM

## 2013-12-08 DIAGNOSIS — Z5112 Encounter for antineoplastic immunotherapy: Secondary | ICD-10-CM

## 2013-12-08 LAB — CBC WITH DIFFERENTIAL/PLATELET
BASO%: 0.9 % (ref 0.0–2.0)
Basophils Absolute: 0.1 10*3/uL (ref 0.0–0.1)
EOS%: 1.7 % (ref 0.0–7.0)
Eosinophils Absolute: 0.1 10*3/uL (ref 0.0–0.5)
HCT: 34.2 % — ABNORMAL LOW (ref 34.8–46.6)
HEMOGLOBIN: 11.2 g/dL — AB (ref 11.6–15.9)
LYMPH#: 1.2 10*3/uL (ref 0.9–3.3)
LYMPH%: 16.6 % (ref 14.0–49.7)
MCH: 28.8 pg (ref 25.1–34.0)
MCHC: 32.6 g/dL (ref 31.5–36.0)
MCV: 88.3 fL (ref 79.5–101.0)
MONO#: 0.5 10*3/uL (ref 0.1–0.9)
MONO%: 6.2 % (ref 0.0–14.0)
NEUT#: 5.5 10*3/uL (ref 1.5–6.5)
NEUT%: 74.6 % (ref 38.4–76.8)
PLATELETS: 200 10*3/uL (ref 145–400)
RBC: 3.88 10*6/uL (ref 3.70–5.45)
RDW: 14.4 % (ref 11.2–14.5)
WBC: 7.3 10*3/uL (ref 3.9–10.3)

## 2013-12-08 LAB — COMPREHENSIVE METABOLIC PANEL (CC13)
ALT: 42 U/L (ref 0–55)
ANION GAP: 10 meq/L (ref 3–11)
AST: 37 U/L — ABNORMAL HIGH (ref 5–34)
Albumin: 3.6 g/dL (ref 3.5–5.0)
Alkaline Phosphatase: 99 U/L (ref 40–150)
BUN: 8.3 mg/dL (ref 7.0–26.0)
CALCIUM: 9.3 mg/dL (ref 8.4–10.4)
CHLORIDE: 103 meq/L (ref 98–109)
CO2: 22 meq/L (ref 22–29)
CREATININE: 0.9 mg/dL (ref 0.6–1.1)
Glucose: 210 mg/dl — ABNORMAL HIGH (ref 70–140)
Potassium: 3.8 mEq/L (ref 3.5–5.1)
Sodium: 135 mEq/L — ABNORMAL LOW (ref 136–145)
TOTAL PROTEIN: 7.1 g/dL (ref 6.4–8.3)
Total Bilirubin: 0.42 mg/dL (ref 0.20–1.20)

## 2013-12-08 MED ORDER — ONDANSETRON HCL 8 MG PO TABS
8.0000 mg | ORAL_TABLET | Freq: Once | ORAL | Status: AC
  Administered 2013-12-08: 8 mg via ORAL

## 2013-12-08 MED ORDER — BORTEZOMIB CHEMO SQ INJECTION 3.5 MG (2.5MG/ML)
1.3000 mg/m2 | Freq: Once | INTRAMUSCULAR | Status: AC
  Administered 2013-12-08: 3.25 mg via SUBCUTANEOUS
  Filled 2013-12-08: qty 3.25

## 2013-12-08 MED ORDER — ONDANSETRON HCL 8 MG PO TABS
ORAL_TABLET | ORAL | Status: AC
  Filled 2013-12-08: qty 1

## 2013-12-08 NOTE — Progress Notes (Addendum)
[place] Telephone:([phone]   Fax:(336) [phone]  OFFICE PROGRESS NOTE  [name], MD [place] [address]  DIAGNOSIS: MGUS diagnosed in September 2010, with additional symptoms suggestive of POEMS syndrome.   CURRENT THERAPY: Velcade 1.3 MG/M2 subcutaneously with Decadron 40 mg by mouth on a weekly basis. First cycle given 12/01/2013. Status post 1 cycle  INTERVAL HISTORY: Brenda Conner 53 y.o. female returns to the clinic today for followup visit. The patient is feeling fine today with no specific complaints.She reports she did have some erythema at the injection site, followed by some bruising. She also had some nausea but no vomiting. She reports Her chemotherapy was delayed as she was getting over an illness. She has had more diarrhea than her baseline as a result.  She denied having any significant weight loss or night sweats. The patient denied having any fever or chills. She has no chest pain, shortness of breath, cough or hemoptysis. She denied having any aching pain. She has had some sleep disturbances related to the dexamethasone.  MEDICAL HISTORY: Past Medical History  Diagnosis Date  . PONV (postoperative nausea and vomiting)   . Hypertension   . Anxiety   . Depression   . Asthma   . Sleep apnea     CPAP at bedtime  . Cancer     waldenstroms/ macroglobinulemia  . Hypercholesteremia   . Hypothyroidism   . Depression   . Macroglobulinemia     ? POEMS syndrome  . Acid reflux     ALLERGIES:  is allergic to codeine; hydrocodone; lortab; and amoxicillin.  MEDICATIONS:  Current Outpatient Prescriptions  Medication Sig Dispense Refill  . acyclovir (ZOVIRAX) 400 MG tablet Take 1 tablet (400 mg total) by mouth 2 (two) times daily. 60 tablet 3  . ALPRAZolam (XANAX) 0.5 MG tablet Take 0.5 mg by mouth at bedtime as needed for anxiety.     [name] aspirin 81 MG tablet Take 81 mg by mouth daily.    [name] azelastine (ASTELIN) 137  MCG/SPRAY nasal spray Place 1 spray into the nose 2 (two) times daily. Use in each nostril as directed    . cetirizine (ZYRTEC) 10 MG tablet Take 10 mg by mouth at bedtime.     [name] dexamethasone (DECADRON) 4 MG tablet 10 tablet by mouth every week. Start with the first dose of chemotherapy. 40 tablet 3  . fexofenadine (ALLEGRA) 180 MG tablet Take 180 mg by mouth every morning.    . Fluticasone-Salmeterol (ADVAIR) 100-50 MCG/DOSE AEPB Inhale 2 puffs into the lungs every 12 (twelve) hours.    [name] ibuprofen (ADVIL,MOTRIN) 100 MG tablet Take 100 mg by mouth every 6 (six) hours as needed.    [name] levothyroxine (SYNTHROID, LEVOTHROID) 125 MCG tablet Take 125 mcg by mouth daily before breakfast.     . omeprazole (PRILOSEC) 40 MG capsule Take 40 mg by mouth 2 (two) times daily.     . pravastatin (PRAVACHOL) 40 MG tablet Take 40 mg by mouth every evening.     . prochlorperazine (COMPAZINE) 10 MG tablet Take 1 tablet (10 mg total) by mouth every 6 (six) hours as needed for nausea or vomiting. 30 tablet 0  . sertraline (ZOLOFT) 50 MG tablet 3 tablets (143m) po daily 90 tablet 12  . UNABLE TO FIND Med Name: Allergy shots    . verapamil (CALAN-SR) 240 MG CR tablet Take 240 mg by mouth 2 (two) times daily.    .[name]  calcium-vitamin D 250-100 MG-UNIT per tablet Take 1 tablet by mouth 2 (two) times daily.    . fluticasone (FLONASE) 50 MCG/ACT nasal spray Place 2 sprays into the nose daily.    . Multiple Vitamins-Minerals (MULTIVITAMIN PO) Take by mouth.    [name] VITAMIN D, CHOLECALCIFEROL, PO Take 4,000 Int'l Units by mouth daily.     No current facility-administered medications for this visit.    SURGICAL HISTORY:  Past Surgical History  Procedure Laterality Date  . Cholecystectomy    . Back surgery  03/2004    herniation, L4-L5  . Bil laprascopic knee surgery    . Uterine fibroid embolization    . Bone marrow biopsy      2011  . Laparoscopic cholecystectomy  1997  . Foot surgery  1998  . Nasal sinus surgery  2004   . Ablation  09/2007    HTA and polyp resection  . Knee arthroscopy w/ meniscal repair  10/10 ; 3/11  . Bone marrow biopsy  4/14    REVIEW OF SYSTEMS:  A comprehensive review of systems was negative except for: Constitutional: positive for sweats and insomnia related to the dexamethasone Gastrointestinal: positive for diarrhea Integument/breast: positive for bruising at the injection site   PHYSICAL EXAMINATION: General appearance: alert, cooperative and no distress Head: Normocephalic, without obvious abnormality, atraumatic Neck: no adenopathy Lymph nodes: Cervical, supraclavicular, and axillary nodes normal. Resp: clear to auscultation bilaterally Cardio: regular rate and rhythm, S1, S2 normal, no murmur, click, rub or gallop GI: soft, non-tender; bowel sounds normal; no masses,  no organomegaly Extremities: extremities normal, atraumatic, no cyanosis or edema Skin: right lateral lower abdomen, approximately 1 cm area of resolving ecchymosis  ECOG PERFORMANCE STATUS: 0 - Asymptomatic  Blood pressure 139/71, pulse 73, temperature 97.9 F (36.6 C), temperature source Oral, resp. rate 18, height 5' 7.5" (1.715 m), weight 271 lb (122.925 kg).  LABORATORY DATA: Lab Results  Component Value Date   WBC 7.3 12/08/2013   HGB 11.2* 12/08/2013   HCT 34.2* 12/08/2013   MCV 88.3 12/08/2013   PLT 200 12/08/2013      Chemistry      Component Value Date/Time   NA 135* 12/08/2013 1356   NA 137 03/20/2011 1509   K 3.8 12/08/2013 1356   K 3.4* 03/20/2011 1509   CL 104 04/07/2012 1415   CL 102 03/20/2011 1509   CO2 22 12/08/2013 1356   CO2 26 03/20/2011 1509   BUN 8.3 12/08/2013 1356   BUN 7 03/20/2011 1509   CREATININE 0.9 12/08/2013 1356   CREATININE 0.63 03/20/2011 1509      Component Value Date/Time   CALCIUM 9.3 12/08/2013 1356   CALCIUM 9.2 03/20/2011 1509   ALKPHOS 99 12/08/2013 1356   ALKPHOS 74 03/20/2011 1509   AST 37* 12/08/2013 1356   AST 24 03/20/2011 1509   ALT  42 12/08/2013 1356   ALT 26 03/20/2011 1509   BILITOT 0.42 12/08/2013 1356   BILITOT 0.3 03/20/2011 1509     Other lab results: Beta-2 microglobulin 2.56, IgG 534, IgA 98 and IgM 1080. Kappa chain 0.87, free lambda light chain 126.00, kappa/lambda ratio 0.01.  ASSESSMENT AND PLAN: This is a very pleasant 53 years old white female with a multiple myeloma initially diagnosed as monoclonal gammopathy in addition to peripheral neuropathy, and Endocrinopathy in the form of hypothyroidism and skin rash. The symptoms combined are suspicious for POEMS syndrome.  She continues to have slow progression of her condition with increase and  the free lambda light chain and IgM. She is currently receiving treatment with subcutaneous Velcade 1.3 MG/M2 weekly with Decadron 40 mg by mouth on a weekly basis.She is status post 1 cycle. The patient was discussed with and also seen by Dr. Julien Nordmann. She will continue with her weekly labs and chemotherapy as scheduled She will return in 3 weeks for another symptom management visit. She will continue on Dexamethasone 40 mg by mouth once weekly.   She was advised to call immediately if her symptoms started getting worse as Dr. Julien Nordmann  may consider the patient for treatment with multiple myeloma regimen. The patient agreed to the current plan.  All questions were answered. The patient knows to call the clinic with any problems, questions or concerns. We can certainly see the patient much sooner if necessary.  Carlton Adam, PA-C 12/08/2013  ADDENDUM: Hematology/Oncology Attending: I had a face to face encounter with the patient today. I recommended her care plan. This is a very pleasant 53 years old white female recently diagnosed with multiple myeloma and currently undergoing treatment with weekly subcutaneous Velcade and Decadron status post 1 cycle. She tolerated the first week of her treatment fairly well with no significant adverse effect except for mild nausea  which is completely resolved. I recommended for the patient to continue her current treatment as scheduled. She would come back for follow-up visit in 3 weeks for reevaluation and management of any adverse effect of her treatment. She was advised to call immediately if she has any concerning symptoms in the interval.  Disclaimer: This note was dictated with voice recognition software. Similar sounding words can inadvertently be transcribed and may not be corrected upon review. Eilleen Kempf., MD 12/08/2013

## 2013-12-08 NOTE — Patient Instructions (Signed)
[facility] Discharge Instructions for Patients Receiving Chemotherapy  Today you received the following chemotherapy agents: Velcade.  To help prevent nausea and vomiting after your treatment, we encourage you to take your nausea medication as prescribed.   If you develop nausea and vomiting that is not controlled by your nausea medication, call the clinic.   BELOW ARE SYMPTOMS THAT SHOULD BE REPORTED IMMEDIATELY:  *FEVER GREATER THAN 100.5 F  *CHILLS WITH OR WITHOUT FEVER  NAUSEA AND VOMITING THAT IS NOT CONTROLLED WITH YOUR NAUSEA MEDICATION  *UNUSUAL SHORTNESS OF BREATH  *UNUSUAL BRUISING OR BLEEDING  TENDERNESS IN MOUTH AND THROAT WITH OR WITHOUT PRESENCE OF ULCERS  *URINARY PROBLEMS  *BOWEL PROBLEMS  UNUSUAL RASH Items with * indicate a potential emergency and should be followed up as soon as possible.  Feel free to call the clinic you have any questions or concerns. The clinic phone number is [phone].    

## 2013-12-08 NOTE — Patient Instructions (Signed)
Continue weekly labs and chemotherapy as scheduled Followup in 3 weeks 

## 2013-12-09 ENCOUNTER — Telehealth: Payer: Self-pay | Admitting: Internal Medicine

## 2013-12-09 NOTE — Telephone Encounter (Signed)
sw. pt and advised on added MD visit on 12.22...Marland Kitchenpt ok and aware

## 2013-12-15 ENCOUNTER — Ambulatory Visit (HOSPITAL_BASED_OUTPATIENT_CLINIC_OR_DEPARTMENT_OTHER): Payer: BC Managed Care – PPO

## 2013-12-15 ENCOUNTER — Other Ambulatory Visit (HOSPITAL_BASED_OUTPATIENT_CLINIC_OR_DEPARTMENT_OTHER): Payer: BC Managed Care – PPO

## 2013-12-15 DIAGNOSIS — C9000 Multiple myeloma not having achieved remission: Secondary | ICD-10-CM

## 2013-12-15 DIAGNOSIS — Z5112 Encounter for antineoplastic immunotherapy: Secondary | ICD-10-CM

## 2013-12-15 LAB — COMPREHENSIVE METABOLIC PANEL (CC13)
ALBUMIN: 3.6 g/dL (ref 3.5–5.0)
ALK PHOS: 91 U/L (ref 40–150)
ALT: 50 U/L (ref 0–55)
ANION GAP: 12 meq/L — AB (ref 3–11)
AST: 44 U/L — ABNORMAL HIGH (ref 5–34)
BUN: 10 mg/dL (ref 7.0–26.0)
CO2: 24 meq/L (ref 22–29)
Calcium: 9.7 mg/dL (ref 8.4–10.4)
Chloride: 101 mEq/L (ref 98–109)
Creatinine: 0.8 mg/dL (ref 0.6–1.1)
EGFR: 84 mL/min/{1.73_m2} — AB (ref >90)
Glucose: 144 mg/dl — ABNORMAL HIGH (ref 70–140)
POTASSIUM: 3.7 meq/L (ref 3.5–5.1)
SODIUM: 137 meq/L (ref 136–145)
TOTAL PROTEIN: 7 g/dL (ref 6.4–8.3)
Total Bilirubin: 0.54 mg/dL (ref 0.20–1.20)

## 2013-12-15 LAB — CBC WITH DIFFERENTIAL/PLATELET
BASO%: 0.6 % (ref 0.0–2.0)
Basophils Absolute: 0 10*3/uL (ref 0.0–0.1)
EOS%: 1.1 % (ref 0.0–7.0)
Eosinophils Absolute: 0.1 10*3/uL (ref 0.0–0.5)
HCT: 34.8 % (ref 34.8–46.6)
HEMOGLOBIN: 11.4 g/dL — AB (ref 11.6–15.9)
LYMPH#: 1.5 10*3/uL (ref 0.9–3.3)
LYMPH%: 18.9 % (ref 14.0–49.7)
MCH: 28.6 pg (ref 25.1–34.0)
MCHC: 32.7 g/dL (ref 31.5–36.0)
MCV: 87.5 fL (ref 79.5–101.0)
MONO#: 0.5 10*3/uL (ref 0.1–0.9)
MONO%: 5.9 % (ref 0.0–14.0)
NEUT#: 5.9 10*3/uL (ref 1.5–6.5)
NEUT%: 73.5 % (ref 38.4–76.8)
Platelets: 166 10*3/uL (ref 145–400)
RBC: 3.97 10*6/uL (ref 3.70–5.45)
RDW: 14.7 % — AB (ref 11.2–14.5)
WBC: 8.1 10*3/uL (ref 3.9–10.3)

## 2013-12-15 MED ORDER — ONDANSETRON HCL 8 MG PO TABS
ORAL_TABLET | ORAL | Status: AC
  Filled 2013-12-15: qty 1

## 2013-12-15 MED ORDER — BORTEZOMIB CHEMO SQ INJECTION 3.5 MG (2.5MG/ML)
1.3000 mg/m2 | Freq: Once | INTRAMUSCULAR | Status: AC
  Administered 2013-12-15: 3.25 mg via SUBCUTANEOUS
  Filled 2013-12-15: qty 3.25

## 2013-12-15 MED ORDER — ONDANSETRON HCL 8 MG PO TABS
8.0000 mg | ORAL_TABLET | Freq: Once | ORAL | Status: AC
  Administered 2013-12-15: 8 mg via ORAL

## 2013-12-15 NOTE — Patient Instructions (Signed)
[facility] Discharge Instructions for Patients Receiving Chemotherapy  Today you received the following chemotherapy agents Velcade  To help prevent nausea and vomiting after your treatment, we encourage you to take your nausea medication as directed/perscribed  If you develop nausea and vomiting that is not controlled by your nausea medication, call the clinic.   BELOW ARE SYMPTOMS THAT SHOULD BE REPORTED IMMEDIATELY:  *FEVER GREATER THAN 100.5 F  *CHILLS WITH OR WITHOUT FEVER  NAUSEA AND VOMITING THAT IS NOT CONTROLLED WITH YOUR NAUSEA MEDICATION  *UNUSUAL SHORTNESS OF BREATH  *UNUSUAL BRUISING OR BLEEDING  TENDERNESS IN MOUTH AND THROAT WITH OR WITHOUT PRESENCE OF ULCERS  *URINARY PROBLEMS  *BOWEL PROBLEMS  UNUSUAL RASH Items with * indicate a potential emergency and should be followed up as soon as possible.  Feel free to call the clinic you have any questions or concerns. The clinic phone number is [phone].

## 2013-12-21 ENCOUNTER — Encounter: Payer: Self-pay | Admitting: Obstetrics & Gynecology

## 2013-12-21 ENCOUNTER — Ambulatory Visit (INDEPENDENT_AMBULATORY_CARE_PROVIDER_SITE_OTHER): Payer: BC Managed Care – PPO | Admitting: Obstetrics & Gynecology

## 2013-12-21 VITALS — BP 118/86 | HR 84 | Resp 16 | Ht 67.0 in | Wt 269.6 lb

## 2013-12-21 DIAGNOSIS — Z124 Encounter for screening for malignant neoplasm of cervix: Secondary | ICD-10-CM

## 2013-12-21 DIAGNOSIS — Z0000 Encounter for general adult medical examination without abnormal findings: Secondary | ICD-10-CM

## 2013-12-21 DIAGNOSIS — Z01419 Encounter for gynecological examination (general) (routine) without abnormal findings: Secondary | ICD-10-CM

## 2013-12-21 LAB — POCT URINALYSIS DIPSTICK
PH UA: 5
Urobilinogen, UA: NEGATIVE

## 2013-12-21 MED ORDER — TERCONAZOLE 0.4 % VA CREA: 1.0000 | TOPICAL_CREAM | Freq: Every day | VAGINAL | Status: DC

## 2013-12-21 NOTE — Progress Notes (Signed)
53 y.o. G0P0000 SingleCaucasianF here for annual exam.  Being treated for multiple myeloma.  Having 4th infusion of 14.  Infusions are weekly.    Had HbA1C earlier this year.  Borderline diabetes.    Had single episode of vaginal bleeding in September.  Having some hot flashes.  Also having some yeast and bladder infections.  Has been treated twice this year for UTI.  Patient's last menstrual period was 09/08/2013.          Sexually active: No.  The current method of family planning is Ablation.    Exercising: Yes.    walking 3x/wk Smoker:  no  Health Maintenance: Pap:  10/23/11 NEG HR hpv not detected History of abnormal Pap:  no MMG:  01/14/13  Colonoscopy:  2011- Normal f/u not sure per patient BMD:   Done at work 2008 TDaP:  2007 Screening Labs: Aron Baba, MD , Urine today: Trace WBC's   reports that she has never smoked. She has never used smokeless tobacco. She reports that she does not drink alcohol or use illicit drugs.  Past Medical History  Diagnosis Date  . PONV (postoperative nausea and vomiting)   . Hypertension   . Anxiety   . Depression   . Asthma   . Sleep apnea     CPAP at bedtime  . Cancer     waldenstroms/ macroglobinulemia  . Hypercholesteremia   . Hypothyroidism   . Depression   . Macroglobulinemia     ? POEMS syndrome  . Acid reflux     Past Surgical History  Procedure Laterality Date  . Cholecystectomy    . Back surgery  03/2004    herniation, L4-L5  . Bil laprascopic knee surgery    . Uterine fibroid embolization    . Bone marrow biopsy      2011  . Laparoscopic cholecystectomy  1997  . Foot surgery  1998  . Nasal sinus surgery  2004  . Ablation  09/2007    HTA and polyp resection  . Knee arthroscopy w/ meniscal repair  10/10 ; 3/11  . Bone marrow biopsy  4/14    Current Outpatient Prescriptions  Medication Sig Dispense Refill  . acyclovir (ZOVIRAX) 400 MG tablet Take 1 tablet (400 mg total) by mouth 2 (two) times daily. 60 tablet  3  . ALPRAZolam (XANAX) 0.5 MG tablet Take 0.5 mg by mouth at bedtime as needed for anxiety.     [name] aspirin 81 MG tablet Take 81 mg by mouth daily.    . Azelastine HCl 0.15 % SOLN as needed.    . cetirizine (ZYRTEC) 10 MG tablet Take 10 mg by mouth at bedtime.     [name] dexamethasone (DECADRON) 4 MG tablet 10 tablet by mouth every week. Start with the first dose of chemotherapy. 40 tablet 3  . fexofenadine (ALLEGRA) 180 MG tablet Take 180 mg by mouth every morning.    . Fluticasone-Salmeterol (ADVAIR) 100-50 MCG/DOSE AEPB Inhale 2 puffs into the lungs every 12 (twelve) hours.    [name] levothyroxine (SYNTHROID, LEVOTHROID) 125 MCG tablet Take 125 mcg by mouth daily before breakfast.     . omeprazole (PRILOSEC) 40 MG capsule Take 40 mg by mouth 2 (two) times daily.     . pravastatin (PRAVACHOL) 40 MG tablet Take 40 mg by mouth every evening.     . prochlorperazine (COMPAZINE) 10 MG tablet Take 1 tablet (10 mg total) by mouth every 6 (six) hours as needed for nausea or vomiting. [place]  tablet 0  . sertraline (ZOLOFT) 100 MG tablet     . sertraline (ZOLOFT) 50 MG tablet 3 tablets (153m) po daily 90 tablet 12  . UNABLE TO FIND Med Name: Allergy shots    . verapamil (CALAN-SR) 240 MG CR tablet Take 240 mg by mouth 2 (two) times daily.    . calcium-vitamin D 250-100 MG-UNIT per tablet Take 1 tablet by mouth 2 (two) times daily.    .Marland Kitchenibuprofen (ADVIL,MOTRIN) 100 MG tablet Take 100 mg by mouth every 6 (six) hours as needed.    . Multiple Vitamins-Minerals (MULTIVITAMIN PO) Take by mouth.    .Marland KitchenVITAMIN D, CHOLECALCIFEROL, PO Take 4,000 Int'l Units by mouth daily.     No current facility-administered medications for this visit.    Family History  Problem Relation Age of Onset  . Heart disease Mother     heart cath  . Asthma Mother   . Endometriosis Mother     ROS:  Pertinent items are noted in HPI.  Otherwise, a comprehensive ROS was negative.  Exam:   BP 118/86 mmHg  Pulse 84  Resp 16  Ht 5' 7"  (1.702  m)  Wt 269 lb 9.6 oz (122.29 kg)  BMI 42.22 kg/m2  LMP 09/08/2013  Weight change: -5#  Height: 5' 7"  (170.2 cm)  Ht Readings from Last 3 Encounters:  12/21/13 5' 7"  (1.702 m)  12/08/13 5' 7.5" (1.715 m)  11/18/13 5' 7.5" (1.715 m)    General appearance: alert, cooperative and appears stated age Head: Normocephalic, without obvious abnormality, atraumatic Neck: no adenopathy, supple, symmetrical, trachea midline and thyroid normal to inspection and palpation Lungs: clear to auscultation bilaterally Breasts: normal appearance, no masses or tenderness Heart: regular rate and rhythm Abdomen: soft, non-tender; bowel sounds normal; no masses,  no organomegaly Extremities: extremities normal, atraumatic, no cyanosis or edema Skin: Skin color, texture, turgor normal. No rashes or lesions Lymph nodes: Cervical, supraclavicular, and axillary nodes normal. No abnormal inguinal nodes palpated Neurologic: Grossly normal   Pelvic: External genitalia:  no lesions              Urethra:  normal appearing urethra with no masses, tenderness or lesions              Bartholins and Skenes: normal                 Vagina: normal appearing vagina with normal color and discharge, no lesions              Cervix: no lesions              Pap taken: Yes.   Bimanual Exam:  Uterus:  normal size, contour, position, consistency, mobility, non-tender              Adnexa: normal adnexa and no mass, fullness, tenderness               Rectovaginal: Confirms               Anus:  normal sphincter tone, no lesions  Chaperone was present for exam.  A:  Well Woman with normal exam Perimenopausal with increased anxiety recently H/O HTA 9/09 Hypertension Elevated lipids Borderline BM Multiple myeloma.  Under treatment with Dr. MJulien Nordmann   Yeast vaginitis  P: Mammogram yearly. Pap smear with neg HR HPV 2013. Pap today. On Zoloft 1574mdaily.  RF not needed but will call when needs RF Will check when  colonoscopy is due.  Release signed.  Terazol 7 rx to pharmacy return annually or prn

## 2013-12-22 ENCOUNTER — Other Ambulatory Visit (HOSPITAL_BASED_OUTPATIENT_CLINIC_OR_DEPARTMENT_OTHER): Payer: BC Managed Care – PPO

## 2013-12-22 ENCOUNTER — Ambulatory Visit (HOSPITAL_BASED_OUTPATIENT_CLINIC_OR_DEPARTMENT_OTHER): Payer: BC Managed Care – PPO

## 2013-12-22 DIAGNOSIS — C9000 Multiple myeloma not having achieved remission: Secondary | ICD-10-CM

## 2013-12-22 DIAGNOSIS — Z5112 Encounter for antineoplastic immunotherapy: Secondary | ICD-10-CM

## 2013-12-22 LAB — CBC WITH DIFFERENTIAL/PLATELET
BASO%: 0.8 % (ref 0.0–2.0)
Basophils Absolute: 0.1 10*3/uL (ref 0.0–0.1)
EOS%: 1.4 % (ref 0.0–7.0)
Eosinophils Absolute: 0.1 10*3/uL (ref 0.0–0.5)
HCT: 34.3 % — ABNORMAL LOW (ref 34.8–46.6)
HGB: 11.2 g/dL — ABNORMAL LOW (ref 11.6–15.9)
LYMPH%: 17.6 % (ref 14.0–49.7)
MCH: 28.8 pg (ref 25.1–34.0)
MCHC: 32.7 g/dL (ref 31.5–36.0)
MCV: 87.9 fL (ref 79.5–101.0)
MONO#: 0.4 10*3/uL (ref 0.1–0.9)
MONO%: 5.5 % (ref 0.0–14.0)
NEUT#: 5.4 10*3/uL (ref 1.5–6.5)
NEUT%: 74.7 % (ref 38.4–76.8)
Platelets: 150 10*3/uL (ref 145–400)
RBC: 3.9 10*6/uL (ref 3.70–5.45)
RDW: 14.8 % — AB (ref 11.2–14.5)
WBC: 7.3 10*3/uL (ref 3.9–10.3)
lymph#: 1.3 10*3/uL (ref 0.9–3.3)

## 2013-12-22 LAB — COMPREHENSIVE METABOLIC PANEL (CC13)
ALK PHOS: 118 U/L (ref 40–150)
ALT: 72 U/L — AB (ref 0–55)
AST: 60 U/L — AB (ref 5–34)
Albumin: 3.5 g/dL (ref 3.5–5.0)
Anion Gap: 13 mEq/L — ABNORMAL HIGH (ref 3–11)
BUN: 10.2 mg/dL (ref 7.0–26.0)
CO2: 24 mEq/L (ref 22–29)
Calcium: 9.4 mg/dL (ref 8.4–10.4)
Chloride: 100 mEq/L (ref 98–109)
Creatinine: 0.8 mg/dL (ref 0.6–1.1)
EGFR: 86 mL/min/{1.73_m2} — ABNORMAL LOW (ref >90)
Glucose: 152 mg/dl — ABNORMAL HIGH (ref 70–140)
Potassium: 4.1 mEq/L (ref 3.5–5.1)
Sodium: 137 mEq/L (ref 136–145)
Total Bilirubin: 0.47 mg/dL (ref 0.20–1.20)
Total Protein: 6.9 g/dL (ref 6.4–8.3)

## 2013-12-22 MED ORDER — ONDANSETRON HCL 8 MG PO TABS
8.0000 mg | ORAL_TABLET | Freq: Once | ORAL | Status: AC
  Administered 2013-12-22: 8 mg via ORAL

## 2013-12-22 MED ORDER — BORTEZOMIB CHEMO SQ INJECTION 3.5 MG (2.5MG/ML)
1.3000 mg/m2 | Freq: Once | INTRAMUSCULAR | Status: AC
  Administered 2013-12-22: 3.25 mg via SUBCUTANEOUS
  Filled 2013-12-22: qty 3.25

## 2013-12-22 MED ORDER — ONDANSETRON HCL 8 MG PO TABS
ORAL_TABLET | ORAL | Status: AC
  Filled 2013-12-22: qty 1

## 2013-12-22 NOTE — Patient Instructions (Signed)
[facility] Discharge Instructions for Patients Receiving Chemotherapy  Today you received the following chemotherapy agents Velcade.  To help prevent nausea and vomiting after your treatment, we encourage you to take your nausea medication as directed.    If you develop nausea and vomiting that is not controlled by your nausea medication, call the clinic.   BELOW ARE SYMPTOMS THAT SHOULD BE REPORTED IMMEDIATELY:  *FEVER GREATER THAN 100.5 F  *CHILLS WITH OR WITHOUT FEVER  NAUSEA AND VOMITING THAT IS NOT CONTROLLED WITH YOUR NAUSEA MEDICATION  *UNUSUAL SHORTNESS OF BREATH  *UNUSUAL BRUISING OR BLEEDING  TENDERNESS IN MOUTH AND THROAT WITH OR WITHOUT PRESENCE OF ULCERS  *URINARY PROBLEMS  *BOWEL PROBLEMS  UNUSUAL RASH Items with * indicate a potential emergency and should be followed up as soon as possible.  Feel free to call the clinic you have any questions or concerns. The clinic phone number is [phone].    

## 2013-12-24 LAB — IPS PAP TEST WITH REFLEX TO HPV

## 2013-12-29 ENCOUNTER — Ambulatory Visit: Payer: BC Managed Care – PPO

## 2013-12-29 ENCOUNTER — Ambulatory Visit (HOSPITAL_BASED_OUTPATIENT_CLINIC_OR_DEPARTMENT_OTHER): Payer: BC Managed Care – PPO | Admitting: Physician Assistant

## 2013-12-29 ENCOUNTER — Encounter: Payer: Self-pay | Admitting: Physician Assistant

## 2013-12-29 ENCOUNTER — Other Ambulatory Visit: Payer: BC Managed Care – PPO | Admitting: Lab

## 2013-12-29 ENCOUNTER — Ambulatory Visit (HOSPITAL_BASED_OUTPATIENT_CLINIC_OR_DEPARTMENT_OTHER): Payer: BC Managed Care – PPO | Admitting: Lab

## 2013-12-29 ENCOUNTER — Ambulatory Visit (HOSPITAL_BASED_OUTPATIENT_CLINIC_OR_DEPARTMENT_OTHER): Payer: BC Managed Care – PPO

## 2013-12-29 VITALS — BP 149/81 | HR 69 | Temp 98.2°F | Resp 19 | Ht 67.0 in | Wt 267.7 lb

## 2013-12-29 DIAGNOSIS — C9000 Multiple myeloma not having achieved remission: Secondary | ICD-10-CM

## 2013-12-29 DIAGNOSIS — Z5112 Encounter for antineoplastic immunotherapy: Secondary | ICD-10-CM

## 2013-12-29 LAB — COMPREHENSIVE METABOLIC PANEL (CC13)
ALBUMIN: 3.4 g/dL — AB (ref 3.5–5.0)
ALK PHOS: 115 U/L (ref 40–150)
ALT: 82 U/L — ABNORMAL HIGH (ref 0–55)
AST: 65 U/L — ABNORMAL HIGH (ref 5–34)
Anion Gap: 11 mEq/L (ref 3–11)
BUN: 7.8 mg/dL (ref 7.0–26.0)
CO2: 22 mEq/L (ref 22–29)
CREATININE: 0.9 mg/dL (ref 0.6–1.1)
Calcium: 8.9 mg/dL (ref 8.4–10.4)
Chloride: 101 mEq/L (ref 98–109)
EGFR: 78 mL/min/{1.73_m2} — ABNORMAL LOW (ref >90)
GLUCOSE: 297 mg/dL — AB (ref 70–140)
POTASSIUM: 3.7 meq/L (ref 3.5–5.1)
Sodium: 134 mEq/L — ABNORMAL LOW (ref 136–145)
Total Bilirubin: 0.37 mg/dL (ref 0.20–1.20)
Total Protein: 6.5 g/dL (ref 6.4–8.3)

## 2013-12-29 LAB — CBC WITH DIFFERENTIAL/PLATELET
BASO%: 0.5 % (ref 0.0–2.0)
Basophils Absolute: 0 10*3/uL (ref 0.0–0.1)
EOS%: 1.3 % (ref 0.0–7.0)
Eosinophils Absolute: 0.1 10*3/uL (ref 0.0–0.5)
HEMATOCRIT: 32.2 % — AB (ref 34.8–46.6)
HGB: 10.7 g/dL — ABNORMAL LOW (ref 11.6–15.9)
LYMPH#: 1 10*3/uL (ref 0.9–3.3)
LYMPH%: 14.8 % (ref 14.0–49.7)
MCH: 29.2 pg (ref 25.1–34.0)
MCHC: 33.2 g/dL (ref 31.5–36.0)
MCV: 88 fL (ref 79.5–101.0)
MONO#: 0.4 10*3/uL (ref 0.1–0.9)
MONO%: 5.2 % (ref 0.0–14.0)
NEUT#: 5.5 10*3/uL (ref 1.5–6.5)
NEUT%: 78.2 % — AB (ref 38.4–76.8)
Platelets: 169 10*3/uL (ref 145–400)
RBC: 3.66 10*6/uL — AB (ref 3.70–5.45)
RDW: 15.3 % — ABNORMAL HIGH (ref 11.2–14.5)
WBC: 7 10*3/uL (ref 3.9–10.3)

## 2013-12-29 MED ORDER — ONDANSETRON HCL 8 MG PO TABS
8.0000 mg | ORAL_TABLET | Freq: Once | ORAL | Status: AC
  Administered 2013-12-29: 8 mg via ORAL

## 2013-12-29 MED ORDER — BORTEZOMIB CHEMO SQ INJECTION 3.5 MG (2.5MG/ML)
1.3000 mg/m2 | Freq: Once | INTRAMUSCULAR | Status: AC
  Administered 2013-12-29: 3.25 mg via SUBCUTANEOUS
  Filled 2013-12-29: qty 3.25

## 2013-12-29 MED ORDER — ONDANSETRON HCL 8 MG PO TABS
ORAL_TABLET | ORAL | Status: AC
  Filled 2013-12-29: qty 1

## 2013-12-29 NOTE — Patient Instructions (Signed)
Continue labs and chemotherapy as scheduled Follow-up in 3 weeks for another symptom management visit

## 2013-12-29 NOTE — Progress Notes (Signed)
Ok to treat with ALT of 82 per Awilda Metro, PAC.

## 2013-12-29 NOTE — Progress Notes (Addendum)
[place] [phone]   [phone]  OFFICE PROGRESS NOTE  [name], MD [place] [address]  DIAGNOSIS: MGUS diagnosed in September 2010, with additional symptoms suggestive of POEMS syndrome.   CURRENT THERAPY: Velcade 1.3 MG/M2 subcutaneously with Decadron 40 mg by mouth on a weekly basis. First cycle given 12/01/2013. Status post 4 cycles  INTERVAL HISTORY: Brenda Conner 53 y.o. female returns to the clinic today for followup visit. She is accompanied by her sister who is visiting from [place]. She is tolerating her treatment with Velcade and simethicone relatively well the exception of some fatigue and some sleep disturbances related to the steroids. She notes some bruising at the injection site of her subcutaneous Velcade but otherwise is tolerating her treatment without difficulty. The patient is feeling fine today with no other specific complaints. She denied having any significant weight loss or night sweats. The patient denied having any fever or chills. She has no chest pain, shortness of breath, cough or hemoptysis. She denied having any aching pain.   MEDICAL HISTORY: Past Medical History  Diagnosis Date  . PONV (postoperative nausea and vomiting)   . Hypertension   . Anxiety   . Depression   . Asthma   . Sleep apnea     CPAP at bedtime  . Cancer     waldenstroms/ macroglobinulemia  . Hypercholesteremia   . Hypothyroidism   . Depression   . Macroglobulinemia     ? POEMS syndrome  . Acid reflux   . Multiple myeloma     ALLERGIES:  is allergic to codeine; hydrocodone; lortab; and amoxicillin.  MEDICATIONS:  Current Outpatient Prescriptions  Medication Sig Dispense Refill  . acyclovir (ZOVIRAX) 400 MG tablet Take 1 tablet (400 mg total) by mouth 2 (two) times daily. 60 tablet 3  . aspirin 81 MG tablet Take 81 mg by mouth daily.    . Azelastine HCl 0.15 % SOLN as needed.    . cetirizine  (ZYRTEC) 10 MG tablet Take 10 mg by mouth at bedtime.     [name] dexamethasone (DECADRON) 4 MG tablet 10 tablet by mouth every week. Start with the first dose of chemotherapy. 40 tablet 3  . fexofenadine (ALLEGRA) 180 MG tablet Take 180 mg by mouth every morning.    . Fluticasone-Salmeterol (ADVAIR) 100-50 MCG/DOSE AEPB Inhale 2 puffs into the lungs every 12 (twelve) hours.    [name] levothyroxine (SYNTHROID, LEVOTHROID) 125 MCG tablet Take 125 mcg by mouth daily before breakfast.     . omeprazole (PRILOSEC) 40 MG capsule Take 40 mg by mouth 2 (two) times daily.     . prochlorperazine (COMPAZINE) 10 MG tablet Take 1 tablet (10 mg total) by mouth every 6 (six) hours as needed for nausea or vomiting. 30 tablet 0  . sertraline (ZOLOFT) 50 MG tablet 3 tablets (167m) po daily 90 tablet 12  . UNABLE TO FIND Med Name: Allergy shots    . verapamil (CALAN-SR) 240 MG CR tablet Take 240 mg by mouth 2 (two) times daily.    .Marland KitchenALPRAZolam (XANAX) 0.5 MG tablet Take 0.5 mg by mouth at bedtime as needed for anxiety.     . calcium-vitamin D 250-100 MG-UNIT per tablet Take 1 tablet by mouth 2 (two) times daily.    .Marland Kitchenibuprofen (ADVIL,MOTRIN) 100 MG tablet Take 100 mg by mouth every 6 (six) hours as needed.    . Multiple Vitamins-Minerals (MULTIVITAMIN PO) Take by mouth.    .[name]  pravastatin (PRAVACHOL) 40 MG tablet Take 40 mg by mouth every evening.     [name] VITAMIN D, CHOLECALCIFEROL, PO Take 4,000 Int'l Units by mouth daily.     No current facility-administered medications for this visit.    SURGICAL HISTORY:  Past Surgical History  Procedure Laterality Date  . Cholecystectomy    . Back surgery  03/2004    herniation, L4-L5  . Bil laprascopic knee surgery    . Uterine fibroid embolization    . Bone marrow biopsy      2011  . Laparoscopic cholecystectomy  1997  . Foot surgery  1998  . Nasal sinus surgery  2004  . Ablation  09/2007    HTA and polyp resection  . Knee arthroscopy w/ meniscal repair  10/10 ; 3/11  . Bone  marrow biopsy  4/14    REVIEW OF SYSTEMS:  A comprehensive review of systems was negative except for: Constitutional: positive for sweats and insomnia related to the dexamethasone Gastrointestinal: positive for diarrhea Integument/breast: positive for bruising at the injection site   PHYSICAL EXAMINATION: General appearance: alert, cooperative and no distress Head: Normocephalic, without obvious abnormality, atraumatic Neck: no adenopathy Lymph nodes: Cervical, supraclavicular, and axillary nodes normal. Resp: clear to auscultation bilaterally Cardio: regular rate and rhythm, S1, S2 normal, no murmur, click, rub or gallop GI: soft, non-tender; bowel sounds normal; no masses,  no organomegaly Extremities: extremities normal, atraumatic, no cyanosis or edema Skin: left lateral lower abdomen, approximately 1.5 cm area of resolving ecchymosis  ECOG PERFORMANCE STATUS: 0 - Asymptomatic  Blood pressure 149/81, pulse 69, temperature 98.2 F (36.8 C), temperature source Oral, resp. rate 19, height _0  (1.702 m), weight 267 lb 11.2 oz (121.428 kg), last menstrual period 09/08/2013, SpO2 97 %.  LABORATORY DATA: Lab Results  Component Value Date   WBC 7.0 12/29/2013   HGB 10.7* 12/29/2013   HCT 32.2* 12/29/2013   MCV 88.0 12/29/2013   PLT 169 12/29/2013      Chemistry      Component Value Date/Time   NA 134* 12/29/2013 1402   NA 137 03/20/2011 1509   K 3.7 12/29/2013 1402   K 3.4* 03/20/2011 1509   CL 104 04/07/2012 1415   CL 102 03/20/2011 1509   CO2 22 12/29/2013 1402   CO2 26 03/20/2011 1509   BUN 7.8 12/29/2013 1402   BUN 7 03/20/2011 1509   CREATININE 0.9 12/29/2013 1402   CREATININE 0.63 03/20/2011 1509      Component Value Date/Time   CALCIUM 8.9 12/29/2013 1402   CALCIUM 9.2 03/20/2011 1509   ALKPHOS 115 12/29/2013 1402   ALKPHOS 74 03/20/2011 1509   AST 65* 12/29/2013 1402   AST 24 03/20/2011 1509   ALT 82* 12/29/2013 1402   ALT 26 03/20/2011 1509   BILITOT  0.37 12/29/2013 1402   BILITOT 0.3 03/20/2011 1509     Other lab results: Beta-2 microglobulin 2.56, IgG 534, IgA 98 and IgM 1080. Kappa chain 0.87, free lambda light chain 126.00, kappa/lambda ratio 0.01.  ASSESSMENT AND PLAN: This is a very pleasant 53 years old white female with a multiple myeloma initially diagnosed as monoclonal gammopathy in addition to peripheral neuropathy, and Endocrinopathy in the form of hypothyroidism and skin rash. The symptoms combined are suspicious for POEMS syndrome.  She continues to have slow progression of her condition with increase and the free lambda light chain and IgM. She is currently receiving treatment with subcutaneous Velcade 1.3 MG/M2 weekly with Decadron 40  mg by mouth on a weekly basis.She is status post 4 cycles. The patient was discussed with and also seen by Dr. Julien Nordmann. She will continue with her weekly labs and chemotherapy as scheduled She will return in 3 weeks for another symptom management visit. She will continue on Dexamethasone 40 mg by mouth once weekly.   She was advised to call immediately if her symptoms started getting worse as Dr. Julien Nordmann  may consider the patient for treatment with multiple myeloma regimen. The patient agreed to the current plan.  All questions were answered. The patient knows to call the clinic with any problems, questions or concerns. We can certainly see the patient much sooner if necessary.  Carlton Adam, PA-C 12/29/2013    ADDENDUM: Hematology/Oncology Attending: I had a face to face encounter with the patient. I recommended her care plan. This is a very pleasant 53 years old white female with multiple myeloma currently undergoing treatment with weekly subcutaneous Velcade and weekly Decadron status post 4 cycles. The patient is tolerating her treatment fairly well with no significant adverse effects except for mild fatigue. I recommended for her to continue her current treatment with Velcade and  Decadron as a scheduled. She will come back for follow-up visit in 3 weeks for reevaluation. She was advised to call immediately if she has any concerning symptoms in the interval.  Disclaimer: This note was dictated with voice recognition software. Similar sounding words can inadvertently be transcribed and may be missed upon review. Eilleen Kempf., MD 01/02/2014

## 2013-12-29 NOTE — Patient Instructions (Signed)
[facility] Discharge Instructions for Patients Receiving Chemotherapy  Today you received the following chemotherapy agents velcade   To help prevent nausea and vomiting after your treatment, we encourage you to take your nausea medication as directed  If you develop nausea and vomiting that is not controlled by your nausea medication, call the clinic.   BELOW ARE SYMPTOMS THAT SHOULD BE REPORTED IMMEDIATELY:  *FEVER GREATER THAN 100.5 F  *CHILLS WITH OR WITHOUT FEVER  NAUSEA AND VOMITING THAT IS NOT CONTROLLED WITH YOUR NAUSEA MEDICATION  *UNUSUAL SHORTNESS OF BREATH  *UNUSUAL BRUISING OR BLEEDING  TENDERNESS IN MOUTH AND THROAT WITH OR WITHOUT PRESENCE OF ULCERS  *URINARY PROBLEMS  *BOWEL PROBLEMS  UNUSUAL RASH Items with * indicate a potential emergency and should be followed up as soon as possible.  Feel free to call the clinic you have any questions or concerns. The clinic phone number is [phone].  

## 2013-12-30 ENCOUNTER — Telehealth: Payer: Self-pay | Admitting: *Deleted

## 2013-12-30 NOTE — Telephone Encounter (Signed)
Per staff message and POF I have scheduled appts. Advised scheduler of appts. JMW  

## 2014-01-05 ENCOUNTER — Other Ambulatory Visit (HOSPITAL_BASED_OUTPATIENT_CLINIC_OR_DEPARTMENT_OTHER): Payer: BC Managed Care – PPO

## 2014-01-05 ENCOUNTER — Ambulatory Visit (HOSPITAL_BASED_OUTPATIENT_CLINIC_OR_DEPARTMENT_OTHER): Payer: BC Managed Care – PPO

## 2014-01-05 DIAGNOSIS — Z5112 Encounter for antineoplastic immunotherapy: Secondary | ICD-10-CM

## 2014-01-05 DIAGNOSIS — C9000 Multiple myeloma not having achieved remission: Secondary | ICD-10-CM

## 2014-01-05 LAB — COMPREHENSIVE METABOLIC PANEL (CC13)
ALT: 83 U/L — ABNORMAL HIGH (ref 0–55)
ANION GAP: 12 meq/L — AB (ref 3–11)
AST: 62 U/L — ABNORMAL HIGH (ref 5–34)
Albumin: 3.6 g/dL (ref 3.5–5.0)
Alkaline Phosphatase: 125 U/L (ref 40–150)
BUN: 8.8 mg/dL (ref 7.0–26.0)
CALCIUM: 9.5 mg/dL (ref 8.4–10.4)
CHLORIDE: 101 meq/L (ref 98–109)
CO2: 26 meq/L (ref 22–29)
CREATININE: 0.8 mg/dL (ref 0.6–1.1)
EGFR: 84 mL/min/{1.73_m2} — AB (ref >90)
Glucose: 209 mg/dl — ABNORMAL HIGH (ref 70–140)
Potassium: 4.1 mEq/L (ref 3.5–5.1)
Sodium: 138 mEq/L (ref 136–145)
Total Bilirubin: 0.5 mg/dL (ref 0.20–1.20)
Total Protein: 6.9 g/dL (ref 6.4–8.3)

## 2014-01-05 LAB — CBC WITH DIFFERENTIAL/PLATELET
BASO%: 0.7 % (ref 0.0–2.0)
Basophils Absolute: 0 10*3/uL (ref 0.0–0.1)
EOS ABS: 0.1 10*3/uL (ref 0.0–0.5)
EOS%: 1.2 % (ref 0.0–7.0)
HCT: 34 % — ABNORMAL LOW (ref 34.8–46.6)
HGB: 11.1 g/dL — ABNORMAL LOW (ref 11.6–15.9)
LYMPH#: 1.3 10*3/uL (ref 0.9–3.3)
LYMPH%: 18.4 % (ref 14.0–49.7)
MCH: 28.8 pg (ref 25.1–34.0)
MCHC: 32.6 g/dL (ref 31.5–36.0)
MCV: 88.3 fL (ref 79.5–101.0)
MONO#: 0.4 10*3/uL (ref 0.1–0.9)
MONO%: 5.9 % (ref 0.0–14.0)
NEUT%: 73.8 % (ref 38.4–76.8)
NEUTROS ABS: 5 10*3/uL (ref 1.5–6.5)
Platelets: 149 10*3/uL (ref 145–400)
RBC: 3.85 10*6/uL (ref 3.70–5.45)
RDW: 15.4 % — AB (ref 11.2–14.5)
WBC: 6.8 10*3/uL (ref 3.9–10.3)

## 2014-01-05 MED ORDER — ONDANSETRON HCL 8 MG PO TABS
8.0000 mg | ORAL_TABLET | Freq: Once | ORAL | Status: AC
  Administered 2014-01-05: 8 mg via ORAL

## 2014-01-05 MED ORDER — BORTEZOMIB CHEMO SQ INJECTION 3.5 MG (2.5MG/ML)
1.3000 mg/m2 | Freq: Once | INTRAMUSCULAR | Status: AC
Start: 2014-01-05 — End: 2014-01-05
  Administered 2014-01-05: 3.25 mg via SUBCUTANEOUS
  Filled 2014-01-05: qty 3.25

## 2014-01-05 MED ORDER — ONDANSETRON HCL 8 MG PO TABS
ORAL_TABLET | ORAL | Status: AC
  Filled 2014-01-05: qty 1

## 2014-01-05 NOTE — Patient Instructions (Signed)
[facility] Discharge Instructions for Patients Receiving Chemotherapy  Today you received the following chemotherapy agents :  Velcade.  To help prevent nausea and vomiting after your treatment, we encourage you to take your nausea medication as prescribed by your physician.   If you develop nausea and vomiting that is not controlled by your nausea medication, call the clinic.   BELOW ARE SYMPTOMS THAT SHOULD BE REPORTED IMMEDIATELY:  *FEVER GREATER THAN 100.5 F  *CHILLS WITH OR WITHOUT FEVER  NAUSEA AND VOMITING THAT IS NOT CONTROLLED WITH YOUR NAUSEA MEDICATION  *UNUSUAL SHORTNESS OF BREATH  *UNUSUAL BRUISING OR BLEEDING  TENDERNESS IN MOUTH AND THROAT WITH OR WITHOUT PRESENCE OF ULCERS  *URINARY PROBLEMS  *BOWEL PROBLEMS  UNUSUAL RASH Items with * indicate a potential emergency and should be followed up as soon as possible.  Feel free to call the clinic you have any questions or concerns. The clinic phone number is [phone].

## 2014-01-05 NOTE — Progress Notes (Signed)
Dr. Mohamed reviewed all lab results today.  Proceed with chemo as planned per md. 

## 2014-01-12 ENCOUNTER — Ambulatory Visit (HOSPITAL_BASED_OUTPATIENT_CLINIC_OR_DEPARTMENT_OTHER): Payer: BLUE CROSS/BLUE SHIELD

## 2014-01-12 ENCOUNTER — Other Ambulatory Visit (HOSPITAL_BASED_OUTPATIENT_CLINIC_OR_DEPARTMENT_OTHER): Payer: BLUE CROSS/BLUE SHIELD

## 2014-01-12 DIAGNOSIS — C9000 Multiple myeloma not having achieved remission: Secondary | ICD-10-CM

## 2014-01-12 DIAGNOSIS — Z5112 Encounter for antineoplastic immunotherapy: Secondary | ICD-10-CM

## 2014-01-12 LAB — CBC WITH DIFFERENTIAL/PLATELET
BASO%: 0.7 % (ref 0.0–2.0)
Basophils Absolute: 0 10*3/uL (ref 0.0–0.1)
EOS%: 1 % (ref 0.0–7.0)
Eosinophils Absolute: 0.1 10*3/uL (ref 0.0–0.5)
HCT: 34.9 % (ref 34.8–46.6)
HEMOGLOBIN: 11.4 g/dL — AB (ref 11.6–15.9)
LYMPH%: 17.6 % (ref 14.0–49.7)
MCH: 28.9 pg (ref 25.1–34.0)
MCHC: 32.7 g/dL (ref 31.5–36.0)
MCV: 88.1 fL (ref 79.5–101.0)
MONO#: 0.4 10*3/uL (ref 0.1–0.9)
MONO%: 4.9 % (ref 0.0–14.0)
NEUT#: 5.4 10*3/uL (ref 1.5–6.5)
NEUT%: 75.8 % (ref 38.4–76.8)
PLATELETS: 131 10*3/uL — AB (ref 145–400)
RBC: 3.96 10*6/uL (ref 3.70–5.45)
RDW: 15.4 % — AB (ref 11.2–14.5)
WBC: 7.2 10*3/uL (ref 3.9–10.3)
lymph#: 1.3 10*3/uL (ref 0.9–3.3)

## 2014-01-12 LAB — COMPREHENSIVE METABOLIC PANEL (CC13)
ALK PHOS: 123 U/L (ref 40–150)
ALT: 81 U/L — AB (ref 0–55)
AST: 59 U/L — AB (ref 5–34)
Albumin: 3.6 g/dL (ref 3.5–5.0)
Anion Gap: 11 mEq/L (ref 3–11)
BUN: 10.9 mg/dL (ref 7.0–26.0)
CALCIUM: 9.4 mg/dL (ref 8.4–10.4)
CHLORIDE: 99 meq/L (ref 98–109)
CO2: 26 mEq/L (ref 22–29)
CREATININE: 0.8 mg/dL (ref 0.6–1.1)
EGFR: 81 mL/min/{1.73_m2} — ABNORMAL LOW (ref >90)
Glucose: 257 mg/dl — ABNORMAL HIGH (ref 70–140)
POTASSIUM: 3.9 meq/L (ref 3.5–5.1)
Sodium: 136 mEq/L (ref 136–145)
Total Bilirubin: 0.6 mg/dL (ref 0.20–1.20)
Total Protein: 6.9 g/dL (ref 6.4–8.3)

## 2014-01-12 MED ORDER — ONDANSETRON HCL 8 MG PO TABS
8.0000 mg | ORAL_TABLET | Freq: Once | ORAL | Status: AC
  Administered 2014-01-12: 8 mg via ORAL

## 2014-01-12 MED ORDER — ONDANSETRON HCL 8 MG PO TABS
ORAL_TABLET | ORAL | Status: AC
Start: 2014-01-12 — End: 2014-01-12
  Filled 2014-01-12: qty 1

## 2014-01-12 MED ORDER — BORTEZOMIB CHEMO SQ INJECTION 3.5 MG (2.5MG/ML)
1.3000 mg/m2 | Freq: Once | INTRAMUSCULAR | Status: AC
  Administered 2014-01-12: 3.25 mg via SUBCUTANEOUS
  Filled 2014-01-12: qty 3.25

## 2014-01-12 NOTE — Patient Instructions (Signed)
Labs reviewed by Dr. Julien Nordmann and ok to treat.

## 2014-01-19 ENCOUNTER — Encounter: Payer: Self-pay | Admitting: Physician Assistant

## 2014-01-19 ENCOUNTER — Other Ambulatory Visit (HOSPITAL_BASED_OUTPATIENT_CLINIC_OR_DEPARTMENT_OTHER): Payer: BLUE CROSS/BLUE SHIELD

## 2014-01-19 ENCOUNTER — Telehealth: Payer: Self-pay | Admitting: Physician Assistant

## 2014-01-19 ENCOUNTER — Ambulatory Visit (HOSPITAL_BASED_OUTPATIENT_CLINIC_OR_DEPARTMENT_OTHER): Payer: BLUE CROSS/BLUE SHIELD | Admitting: Physician Assistant

## 2014-01-19 ENCOUNTER — Ambulatory Visit (HOSPITAL_BASED_OUTPATIENT_CLINIC_OR_DEPARTMENT_OTHER): Payer: BLUE CROSS/BLUE SHIELD

## 2014-01-19 VITALS — BP 130/78 | HR 70 | Temp 97.6°F | Resp 19 | Ht 67.0 in | Wt 262.2 lb

## 2014-01-19 DIAGNOSIS — G609 Hereditary and idiopathic neuropathy, unspecified: Secondary | ICD-10-CM

## 2014-01-19 DIAGNOSIS — C9000 Multiple myeloma not having achieved remission: Secondary | ICD-10-CM

## 2014-01-19 DIAGNOSIS — E039 Hypothyroidism, unspecified: Secondary | ICD-10-CM

## 2014-01-19 DIAGNOSIS — Z5112 Encounter for antineoplastic immunotherapy: Secondary | ICD-10-CM

## 2014-01-19 DIAGNOSIS — E0965 Drug or chemical induced diabetes mellitus with hyperglycemia: Secondary | ICD-10-CM

## 2014-01-19 LAB — CBC WITH DIFFERENTIAL/PLATELET
BASO%: 0.7 % (ref 0.0–2.0)
Basophils Absolute: 0 10*3/uL (ref 0.0–0.1)
EOS%: 1.1 % (ref 0.0–7.0)
Eosinophils Absolute: 0.1 10*3/uL (ref 0.0–0.5)
HCT: 35.8 % (ref 34.8–46.6)
HGB: 11.9 g/dL (ref 11.6–15.9)
LYMPH#: 1.1 10*3/uL (ref 0.9–3.3)
LYMPH%: 16.1 % (ref 14.0–49.7)
MCH: 29.5 pg (ref 25.1–34.0)
MCHC: 33.3 g/dL (ref 31.5–36.0)
MCV: 88.5 fL (ref 79.5–101.0)
MONO#: 0.4 10*3/uL (ref 0.1–0.9)
MONO%: 5.3 % (ref 0.0–14.0)
NEUT#: 5.3 10*3/uL (ref 1.5–6.5)
NEUT%: 76.8 % (ref 38.4–76.8)
Platelets: 153 10*3/uL (ref 145–400)
RBC: 4.05 10*6/uL (ref 3.70–5.45)
RDW: 15.7 % — ABNORMAL HIGH (ref 11.2–14.5)
WBC: 6.9 10*3/uL (ref 3.9–10.3)

## 2014-01-19 LAB — COMPREHENSIVE METABOLIC PANEL (CC13)
ALT: 72 U/L — AB (ref 0–55)
ANION GAP: 11 meq/L (ref 3–11)
AST: 51 U/L — ABNORMAL HIGH (ref 5–34)
Albumin: 3.7 g/dL (ref 3.5–5.0)
Alkaline Phosphatase: 120 U/L (ref 40–150)
BUN: 13.1 mg/dL (ref 7.0–26.0)
CALCIUM: 9 mg/dL (ref 8.4–10.4)
CO2: 25 mEq/L (ref 22–29)
Chloride: 98 mEq/L (ref 98–109)
Creatinine: 0.9 mg/dL (ref 0.6–1.1)
EGFR: 75 mL/min/{1.73_m2} — ABNORMAL LOW (ref >90)
Glucose: 310 mg/dl — ABNORMAL HIGH (ref 70–140)
Potassium: 4.2 mEq/L (ref 3.5–5.1)
SODIUM: 134 meq/L — AB (ref 136–145)
TOTAL PROTEIN: 7 g/dL (ref 6.4–8.3)
Total Bilirubin: 0.66 mg/dL (ref 0.20–1.20)

## 2014-01-19 MED ORDER — BORTEZOMIB CHEMO SQ INJECTION 3.5 MG (2.5MG/ML)
1.3000 mg/m2 | Freq: Once | INTRAMUSCULAR | Status: AC
  Administered 2014-01-19: 3.25 mg via SUBCUTANEOUS
  Filled 2014-01-19: qty 3.25

## 2014-01-19 MED ORDER — ONDANSETRON HCL 8 MG PO TABS
8.0000 mg | ORAL_TABLET | Freq: Once | ORAL | Status: AC
  Administered 2014-01-19: 8 mg via ORAL

## 2014-01-19 MED ORDER — ONDANSETRON HCL 8 MG PO TABS
ORAL_TABLET | ORAL | Status: AC
  Filled 2014-01-19: qty 1

## 2014-01-19 NOTE — Patient Instructions (Signed)
Labs reviewed by Dr. Julien Nordmann and ok to treat.[facility] Discharge Instructions for Patients Receiving Chemotherapy  Today you received the following chemotherapy agents :  Velcade.  To help prevent nausea and vomiting after your treatment, we encourage you to take your nausea medication as prescribed by your physician.   If you develop nausea and vomiting that is not controlled by your nausea medication, call the clinic.   BELOW ARE SYMPTOMS THAT SHOULD BE REPORTED IMMEDIATELY:  *FEVER GREATER THAN 100.5 F  *CHILLS WITH OR WITHOUT FEVER  NAUSEA AND VOMITING THAT IS NOT CONTROLLED WITH YOUR NAUSEA MEDICATION  *UNUSUAL SHORTNESS OF BREATH  *UNUSUAL BRUISING OR BLEEDING  TENDERNESS IN MOUTH AND THROAT WITH OR WITHOUT PRESENCE OF ULCERS  *URINARY PROBLEMS  *BOWEL PROBLEMS  UNUSUAL RASH Items with * indicate a potential emergency and should be followed up as soon as possible.  Feel free to call the clinic you have any questions or concerns. The clinic phone number is [phone].

## 2014-01-19 NOTE — Telephone Encounter (Signed)
Pt confirmed labs/ov per 01/12 POF, gave pt AVS.... KJ, sent msg to add chemo °

## 2014-01-19 NOTE — Progress Notes (Addendum)
[facility] [phone]   [phone]  OFFICE PROGRESS NOTE  [name], MD [place] [address]  DIAGNOSIS: MGUS diagnosed in September 2010, with additional symptoms suggestive of POEMS syndrome.   CURRENT THERAPY: Velcade 1.3 MG/M2 subcutaneously with Decadron 40 mg by mouth on a weekly basis. First cycle given 12/01/2013. Status post 7 cycles  INTERVAL HISTORY: Brenda Conner 54 y.o. female returns to the clinic today for followup visit. . She is tolerating her treatment with Velcade and dexamethasone relatively well the exception of some fatigue and some sleep disturbances related to the steroids. She notes some bruising at the injection site of her subcutaneous Velcade but otherwise is tolerating her treatment without difficulty. She has noticed an increase in her blood sugar readings between 207 and 250. This also is likely related to the dexamethasone. She states that she has been "prediabetic" The patient is feeling fine today with no other specific complaints. She denied having any significant weight loss or night sweats. The patient denied having any fever or chills. She has no chest pain, shortness of breath, cough or hemoptysis. She denied having any aching pain.   MEDICAL HISTORY: Past Medical History  Diagnosis Date  . PONV (postoperative nausea and vomiting)   . Hypertension   . Anxiety   . Depression   . Asthma   . Sleep apnea     CPAP at bedtime  . Cancer     waldenstroms/ macroglobinulemia  . Hypercholesteremia   . Hypothyroidism   . Depression   . Macroglobulinemia     ? POEMS syndrome  . Acid reflux   . Multiple myeloma     ALLERGIES:  is allergic to codeine; hydrocodone; lortab; and amoxicillin.  MEDICATIONS:  Current Outpatient Prescriptions  Medication Sig Dispense Refill  . acyclovir (ZOVIRAX) 400 MG tablet Take 1 tablet (400 mg total) by mouth 2 (two) times daily. 60 tablet 3  .  ALPRAZolam (XANAX) 0.5 MG tablet Take 0.5 mg by mouth at bedtime as needed for anxiety.     [name] aspirin 81 MG tablet Take 81 mg by mouth daily.    . Azelastine HCl 0.15 % SOLN as needed.    . cetirizine (ZYRTEC) 10 MG tablet Take 10 mg by mouth at bedtime.     [name] dexamethasone (DECADRON) 4 MG tablet 10 tablet by mouth every week. Start with the first dose of chemotherapy. 40 tablet 3  . fexofenadine (ALLEGRA) 180 MG tablet Take 180 mg by mouth every morning.    . Fluticasone-Salmeterol (ADVAIR) 100-50 MCG/DOSE AEPB Inhale 2 puffs into the lungs every 12 (twelve) hours.    [name] ibuprofen (ADVIL,MOTRIN) 100 MG tablet Take 100 mg by mouth every 6 (six) hours as needed.    [name] levothyroxine (SYNTHROID, LEVOTHROID) 125 MCG tablet Take 125 mcg by mouth daily before breakfast.     . omeprazole (PRILOSEC) 40 MG capsule Take 40 mg by mouth 2 (two) times daily.     . pravastatin (PRAVACHOL) 40 MG tablet Take 40 mg by mouth every evening.     . prochlorperazine (COMPAZINE) 10 MG tablet Take 1 tablet (10 mg total) by mouth every 6 (six) hours as needed for nausea or vomiting. 30 tablet 0  . sertraline (ZOLOFT) 50 MG tablet 3 tablets (181m) po daily 90 tablet 12  . terconazole (TERAZOL 7) 0.4 % vaginal cream   0  . UNABLE TO FIND Med Name: Allergy shots    .  verapamil (CALAN-SR) 240 MG CR tablet Take 240 mg by mouth 2 (two) times daily.    . calcium-vitamin D 250-100 MG-UNIT per tablet Take 1 tablet by mouth 2 (two) times daily.    . Multiple Vitamins-Minerals (MULTIVITAMIN PO) Take by mouth.    [name] VITAMIN D, CHOLECALCIFEROL, PO Take 4,000 Int'l Units by mouth daily.     No current facility-administered medications for this visit.    SURGICAL HISTORY:  Past Surgical History  Procedure Laterality Date  . Cholecystectomy    . Back surgery  03/2004    herniation, L4-L5  . Bil laprascopic knee surgery    . Uterine fibroid embolization    . Bone marrow biopsy      2011  . Laparoscopic cholecystectomy  1997  .  Foot surgery  1998  . Nasal sinus surgery  2004  . Ablation  09/2007    HTA and polyp resection  . Knee arthroscopy w/ meniscal repair  10/10 ; 3/11  . Bone marrow biopsy  4/14    REVIEW OF SYSTEMS:  A comprehensive review of systems was negative except for: Constitutional: positive for sweats and insomnia related to the dexamethasone Gastrointestinal: positive for diarrhea Integument/breast: positive for bruising at the injection site   PHYSICAL EXAMINATION: General appearance: alert, cooperative and no distress Head: Normocephalic, without obvious abnormality, atraumatic Neck: no adenopathy Lymph nodes: Cervical, supraclavicular, and axillary nodes normal. Resp: clear to auscultation bilaterally Cardio: regular rate and rhythm, S1, S2 normal, no murmur, click, rub or gallop GI: soft, non-tender; bowel sounds normal; no masses,  no organomegaly Extremities: extremities normal, atraumatic, no cyanosis or edema   ECOG PERFORMANCE STATUS: 0 - Asymptomatic  Blood pressure 130/78, pulse 70, temperature 97.6 F (36.4 C), temperature source Oral, resp. rate 19, height _0  (1.702 m), weight 262 lb 3.2 oz (118.933 kg), last menstrual period 09/08/2013, SpO2 100 %.  LABORATORY DATA: Lab Results  Component Value Date   WBC 6.9 01/19/2014   HGB 11.9 01/19/2014   HCT 35.8 01/19/2014   MCV 88.5 01/19/2014   PLT 153 01/19/2014      Chemistry      Component Value Date/Time   NA 134* 01/19/2014 0954   NA 137 03/20/2011 1509   K 4.2 01/19/2014 0954   K 3.4* 03/20/2011 1509   CL 104 04/07/2012 1415   CL 102 03/20/2011 1509   CO2 25 01/19/2014 0954   CO2 26 03/20/2011 1509   BUN 13.1 01/19/2014 0954   BUN 7 03/20/2011 1509   CREATININE 0.9 01/19/2014 0954   CREATININE 0.63 03/20/2011 1509      Component Value Date/Time   CALCIUM 9.0 01/19/2014 0954   CALCIUM 9.2 03/20/2011 1509   ALKPHOS 120 01/19/2014 0954   ALKPHOS 74 03/20/2011 1509   AST 51* 01/19/2014 0954   AST 24  03/20/2011 1509   ALT 72* 01/19/2014 0954   ALT 26 03/20/2011 1509   BILITOT 0.66 01/19/2014 0954   BILITOT 0.3 03/20/2011 1509     Other lab results: Beta-2 microglobulin 2.56, IgG 534, IgA 98 and IgM 1080. Kappa chain 0.87, free lambda light chain 126.00, kappa/lambda ratio 0.01.  ASSESSMENT AND PLAN: This is a very pleasant 54 years old white female with a multiple myeloma initially diagnosed as monoclonal gammopathy in addition to peripheral neuropathy, and Endocrinopathy in the form of hypothyroidism and skin rash. The symptoms combined are suspicious for POEMS syndrome.  She continues to have slow progression of her condition with increase and  the free lambda light chain and IgM. She is currently receiving treatment with subcutaneous Velcade 1.3 MG/M2 weekly with Decadron 40 mg by mouth on a weekly basis.She is status post 7 cycles. The patient was discussed with and also seen by Dr. Julien Nordmann. She will continue with her weekly labs and chemotherapy as scheduled She will return in 3 weeks for another symptom management visit. She will continue on Dexamethasone 40 mg by mouth once weekly. We will repeat her myeloma panel in 2 weeks so the results are available for Dr. Worthy Flank review when she returns in 3 weeks. Also recommended she contact her primary care provider regarding her elevated blood sugars. She reports that she has been prediabetic and the dexamethasone is likely playing a role in her current elevated blood sugar readings. The dexamethasone as part of her treatment for her multiple myeloma. Patient voiced understanding.  She was advised to call immediately if her symptoms started getting worse as Dr. Julien Nordmann  may consider the patient for treatment with multiple myeloma regimen. The patient agreed to the current plan.  All questions were answered. The patient knows to call the clinic with any problems, questions or concerns. We can certainly see the patient much sooner if  necessary.  Carlton Adam, PA-C 01/19/2014  ADDENDUM: Hematology/Oncology Attending: I have a face to face encounter with the patient today. I recommended her care plan. This is a very pleasant 54 years old white female with multiple myeloma currently undergoing systemic therapy with weekly subcutaneous Velcade and Decadron. She is tolerating her treatment fairly well except for the bruises from the subcutaneous injection as well as hyperglycemia secondary to the Decadron treatment. I recommended for the patient to continue with her current treatment with Velcade and Decadron as a scheduled. She would come back for follow-up visit in 3 weeks after repeating myeloma panel for reevaluation of her disease. For the hyperglycemia, I recommended for the patient to see her primary care physician for consideration of treatment of this condition. She was advised to call immediately if she has any concerning symptoms in the interval.  Disclaimer: This note was dictated with voice recognition software. Similar sounding words can inadvertently be transcribed and may be missed upon review. Eilleen Kempf., MD 01/19/2014

## 2014-01-19 NOTE — Patient Instructions (Signed)
Contact your primary care provider regarding your elevated blood sugar readings Continue labs and chemotherapy as scheduled Follow up in 3 weeks

## 2014-01-21 ENCOUNTER — Telehealth: Payer: Self-pay

## 2014-01-21 NOTE — Telephone Encounter (Signed)
lmtcb-to discuss when colonoscopy is due.//kn

## 2014-01-26 ENCOUNTER — Other Ambulatory Visit (HOSPITAL_BASED_OUTPATIENT_CLINIC_OR_DEPARTMENT_OTHER): Payer: BLUE CROSS/BLUE SHIELD

## 2014-01-26 ENCOUNTER — Ambulatory Visit (HOSPITAL_BASED_OUTPATIENT_CLINIC_OR_DEPARTMENT_OTHER): Payer: BLUE CROSS/BLUE SHIELD

## 2014-01-26 DIAGNOSIS — C9000 Multiple myeloma not having achieved remission: Secondary | ICD-10-CM

## 2014-01-26 DIAGNOSIS — Z5112 Encounter for antineoplastic immunotherapy: Secondary | ICD-10-CM

## 2014-01-26 LAB — CBC WITH DIFFERENTIAL/PLATELET
BASO%: 0.2 % (ref 0.0–2.0)
BASOS ABS: 0 10*3/uL (ref 0.0–0.1)
EOS%: 1.7 % (ref 0.0–7.0)
Eosinophils Absolute: 0.1 10*3/uL (ref 0.0–0.5)
HCT: 33.4 % — ABNORMAL LOW (ref 34.8–46.6)
HGB: 11.3 g/dL — ABNORMAL LOW (ref 11.6–15.9)
LYMPH#: 1 10*3/uL (ref 0.9–3.3)
LYMPH%: 16.4 % (ref 14.0–49.7)
MCH: 29.6 pg (ref 25.1–34.0)
MCHC: 33.8 g/dL (ref 31.5–36.0)
MCV: 87.4 fL (ref 79.5–101.0)
MONO#: 0.3 10*3/uL (ref 0.1–0.9)
MONO%: 5.7 % (ref 0.0–14.0)
NEUT#: 4.4 10*3/uL (ref 1.5–6.5)
NEUT%: 76 % (ref 38.4–76.8)
Platelets: 125 10*3/uL — ABNORMAL LOW (ref 145–400)
RBC: 3.82 10*6/uL (ref 3.70–5.45)
RDW: 14.7 % — ABNORMAL HIGH (ref 11.2–14.5)
WBC: 5.8 10*3/uL (ref 3.9–10.3)

## 2014-01-26 LAB — COMPREHENSIVE METABOLIC PANEL (CC13)
ALBUMIN: 3.6 g/dL (ref 3.5–5.0)
ALT: 63 U/L — AB (ref 0–55)
ANION GAP: 11 meq/L (ref 3–11)
AST: 48 U/L — ABNORMAL HIGH (ref 5–34)
Alkaline Phosphatase: 111 U/L (ref 40–150)
BUN: 10.7 mg/dL (ref 7.0–26.0)
CALCIUM: 8.7 mg/dL (ref 8.4–10.4)
CO2: 25 mEq/L (ref 22–29)
CREATININE: 0.8 mg/dL (ref 0.6–1.1)
Chloride: 101 mEq/L (ref 98–109)
EGFR: 82 mL/min/{1.73_m2} — ABNORMAL LOW (ref >90)
Glucose: 280 mg/dl — ABNORMAL HIGH (ref 70–140)
Potassium: 3.7 mEq/L (ref 3.5–5.1)
Sodium: 136 mEq/L (ref 136–145)
TOTAL PROTEIN: 6.6 g/dL (ref 6.4–8.3)
Total Bilirubin: 0.7 mg/dL (ref 0.20–1.20)

## 2014-01-26 MED ORDER — ONDANSETRON HCL 8 MG PO TABS
8.0000 mg | ORAL_TABLET | Freq: Once | ORAL | Status: AC
  Administered 2014-01-26: 8 mg via ORAL

## 2014-01-26 MED ORDER — ONDANSETRON HCL 8 MG PO TABS
ORAL_TABLET | ORAL | Status: AC
  Filled 2014-01-26: qty 1

## 2014-01-26 MED ORDER — BORTEZOMIB CHEMO SQ INJECTION 3.5 MG (2.5MG/ML)
1.3000 mg/m2 | Freq: Once | INTRAMUSCULAR | Status: AC
  Administered 2014-01-26: 3.25 mg via SUBCUTANEOUS
  Filled 2014-01-26: qty 3.25

## 2014-01-26 NOTE — Patient Instructions (Signed)
Labs reviewed by Dr. Julien Nordmann and ok to treat.[facility] Discharge Instructions for Patients Receiving Chemotherapy  Today you received the following chemotherapy agents :  Velcade.  To help prevent nausea and vomiting after your treatment, we encourage you to take your nausea medication as prescribed by your physician.   If you develop nausea and vomiting that is not controlled by your nausea medication, call the clinic.   BELOW ARE SYMPTOMS THAT SHOULD BE REPORTED IMMEDIATELY:  *FEVER GREATER THAN 100.5 F  *CHILLS WITH OR WITHOUT FEVER  NAUSEA AND VOMITING THAT IS NOT CONTROLLED WITH YOUR NAUSEA MEDICATION  *UNUSUAL SHORTNESS OF BREATH  *UNUSUAL BRUISING OR BLEEDING  TENDERNESS IN MOUTH AND THROAT WITH OR WITHOUT PRESENCE OF ULCERS  *URINARY PROBLEMS  *BOWEL PROBLEMS  UNUSUAL RASH Items with * indicate a potential emergency and should be followed up as soon as possible.  Feel free to call the clinic you have any questions or concerns. The clinic phone number is [phone].

## 2014-01-26 NOTE — Telephone Encounter (Signed)
Patient notified of colonoscopy results and recommendations. Copy of report will be scanned into epic.//kn

## 2014-01-26 NOTE — Telephone Encounter (Signed)
Lmtcb//kn 

## 2014-02-02 ENCOUNTER — Other Ambulatory Visit: Payer: Self-pay | Admitting: Internal Medicine

## 2014-02-02 ENCOUNTER — Other Ambulatory Visit (HOSPITAL_BASED_OUTPATIENT_CLINIC_OR_DEPARTMENT_OTHER): Payer: BLUE CROSS/BLUE SHIELD

## 2014-02-02 ENCOUNTER — Ambulatory Visit (HOSPITAL_BASED_OUTPATIENT_CLINIC_OR_DEPARTMENT_OTHER): Payer: BLUE CROSS/BLUE SHIELD

## 2014-02-02 DIAGNOSIS — C9000 Multiple myeloma not having achieved remission: Secondary | ICD-10-CM

## 2014-02-02 DIAGNOSIS — Z5112 Encounter for antineoplastic immunotherapy: Secondary | ICD-10-CM

## 2014-02-02 LAB — COMPREHENSIVE METABOLIC PANEL (CC13)
ALBUMIN: 3.7 g/dL (ref 3.5–5.0)
ALK PHOS: 105 U/L (ref 40–150)
ALT: 53 U/L (ref 0–55)
ANION GAP: 12 meq/L — AB (ref 3–11)
AST: 38 U/L — ABNORMAL HIGH (ref 5–34)
BILIRUBIN TOTAL: 0.78 mg/dL (ref 0.20–1.20)
BUN: 9.6 mg/dL (ref 7.0–26.0)
CO2: 23 mEq/L (ref 22–29)
Calcium: 9.3 mg/dL (ref 8.4–10.4)
Chloride: 102 mEq/L (ref 98–109)
Creatinine: 0.8 mg/dL (ref 0.6–1.1)
EGFR: 88 mL/min/{1.73_m2} — ABNORMAL LOW (ref >90)
GLUCOSE: 218 mg/dL — AB (ref 70–140)
Potassium: 4 mEq/L (ref 3.5–5.1)
SODIUM: 137 meq/L (ref 136–145)
TOTAL PROTEIN: 6.6 g/dL (ref 6.4–8.3)

## 2014-02-02 LAB — CBC WITH DIFFERENTIAL/PLATELET
BASO%: 0.2 % (ref 0.0–2.0)
Basophils Absolute: 0 10*3/uL (ref 0.0–0.1)
EOS%: 1.6 % (ref 0.0–7.0)
Eosinophils Absolute: 0.1 10*3/uL (ref 0.0–0.5)
HCT: 34.4 % — ABNORMAL LOW (ref 34.8–46.6)
HGB: 11.4 g/dL — ABNORMAL LOW (ref 11.6–15.9)
LYMPH#: 0.9 10*3/uL (ref 0.9–3.3)
LYMPH%: 14.8 % (ref 14.0–49.7)
MCH: 29.5 pg (ref 25.1–34.0)
MCHC: 33.1 g/dL (ref 31.5–36.0)
MCV: 89.1 fL (ref 79.5–101.0)
MONO#: 0.4 10*3/uL (ref 0.1–0.9)
MONO%: 5.9 % (ref 0.0–14.0)
NEUT%: 77.5 % — ABNORMAL HIGH (ref 38.4–76.8)
NEUTROS ABS: 4.8 10*3/uL (ref 1.5–6.5)
Platelets: 123 10*3/uL — ABNORMAL LOW (ref 145–400)
RBC: 3.86 10*6/uL (ref 3.70–5.45)
RDW: 14.8 % — ABNORMAL HIGH (ref 11.2–14.5)
WBC: 6.2 10*3/uL (ref 3.9–10.3)

## 2014-02-02 LAB — LACTATE DEHYDROGENASE (CC13): LDH: 166 U/L (ref 125–245)

## 2014-02-02 MED ORDER — BORTEZOMIB CHEMO SQ INJECTION 3.5 MG (2.5MG/ML)
1.3000 mg/m2 | Freq: Once | INTRAMUSCULAR | Status: AC
  Administered 2014-02-02: 3.25 mg via SUBCUTANEOUS
  Filled 2014-02-02: qty 1.3

## 2014-02-02 MED ORDER — ONDANSETRON HCL 8 MG PO TABS
8.0000 mg | ORAL_TABLET | Freq: Once | ORAL | Status: AC
  Administered 2014-02-02: 8 mg via ORAL

## 2014-02-02 MED ORDER — ONDANSETRON HCL 8 MG PO TABS
ORAL_TABLET | ORAL | Status: AC
  Filled 2014-02-02: qty 1

## 2014-02-02 NOTE — Patient Instructions (Signed)
[facility] Discharge Instructions for Patients Receiving Chemotherapy  Today you received the following chemotherapy agents velcade   To help prevent nausea and vomiting after your treatment, we encourage you to take your nausea medication as directed  If you develop nausea and vomiting that is not controlled by your nausea medication, call the clinic.   BELOW ARE SYMPTOMS THAT SHOULD BE REPORTED IMMEDIATELY:  *FEVER GREATER THAN 100.5 F  *CHILLS WITH OR WITHOUT FEVER  NAUSEA AND VOMITING THAT IS NOT CONTROLLED WITH YOUR NAUSEA MEDICATION  *UNUSUAL SHORTNESS OF BREATH  *UNUSUAL BRUISING OR BLEEDING  TENDERNESS IN MOUTH AND THROAT WITH OR WITHOUT PRESENCE OF ULCERS  *URINARY PROBLEMS  *BOWEL PROBLEMS  UNUSUAL RASH Items with * indicate a potential emergency and should be followed up as soon as possible.  Feel free to call the clinic you have any questions or concerns. The clinic phone number is [phone].  

## 2014-02-04 LAB — KAPPA/LAMBDA LIGHT CHAINS
Kappa free light chain: 0.3 mg/dL — ABNORMAL LOW (ref 0.33–1.94)
Kappa:Lambda Ratio: 0 — ABNORMAL LOW (ref 0.26–1.65)
LAMBDA FREE LGHT CHN: 66.1 mg/dL — AB (ref 0.57–2.63)

## 2014-02-04 LAB — IGG, IGA, IGM
IGA: 77 mg/dL (ref 69–380)
IGG (IMMUNOGLOBIN G), SERUM: 330 mg/dL — AB (ref 690–1700)
IGM, SERUM: 809 mg/dL — AB (ref 52–322)

## 2014-02-04 LAB — BETA 2 MICROGLOBULIN, SERUM: BETA 2 MICROGLOBULIN: 2.05 mg/L (ref <=2.51)

## 2014-02-09 ENCOUNTER — Other Ambulatory Visit (HOSPITAL_BASED_OUTPATIENT_CLINIC_OR_DEPARTMENT_OTHER): Payer: BLUE CROSS/BLUE SHIELD

## 2014-02-09 ENCOUNTER — Ambulatory Visit (HOSPITAL_BASED_OUTPATIENT_CLINIC_OR_DEPARTMENT_OTHER): Payer: BLUE CROSS/BLUE SHIELD

## 2014-02-09 ENCOUNTER — Telehealth: Payer: Self-pay | Admitting: Physician Assistant

## 2014-02-09 ENCOUNTER — Ambulatory Visit: Payer: BC Managed Care – PPO

## 2014-02-09 ENCOUNTER — Ambulatory Visit (HOSPITAL_BASED_OUTPATIENT_CLINIC_OR_DEPARTMENT_OTHER): Payer: BLUE CROSS/BLUE SHIELD | Admitting: Internal Medicine

## 2014-02-09 ENCOUNTER — Other Ambulatory Visit: Payer: BC Managed Care – PPO

## 2014-02-09 ENCOUNTER — Encounter: Payer: Self-pay | Admitting: Internal Medicine

## 2014-02-09 VITALS — BP 132/67 | HR 70 | Temp 97.8°F | Resp 18 | Ht 67.0 in | Wt 258.7 lb

## 2014-02-09 DIAGNOSIS — G629 Polyneuropathy, unspecified: Secondary | ICD-10-CM

## 2014-02-09 DIAGNOSIS — C9000 Multiple myeloma not having achieved remission: Secondary | ICD-10-CM

## 2014-02-09 DIAGNOSIS — R21 Rash and other nonspecific skin eruption: Secondary | ICD-10-CM

## 2014-02-09 DIAGNOSIS — E039 Hypothyroidism, unspecified: Secondary | ICD-10-CM

## 2014-02-09 DIAGNOSIS — Z5112 Encounter for antineoplastic immunotherapy: Secondary | ICD-10-CM

## 2014-02-09 LAB — COMPREHENSIVE METABOLIC PANEL (CC13)
ALT: 53 U/L (ref 0–55)
ANION GAP: 12 meq/L — AB (ref 3–11)
AST: 51 U/L — ABNORMAL HIGH (ref 5–34)
Albumin: 3.7 g/dL (ref 3.5–5.0)
Alkaline Phosphatase: 97 U/L (ref 40–150)
BUN: 7.3 mg/dL (ref 7.0–26.0)
CALCIUM: 8.9 mg/dL (ref 8.4–10.4)
CHLORIDE: 104 meq/L (ref 98–109)
CO2: 21 mEq/L — ABNORMAL LOW (ref 22–29)
CREATININE: 0.8 mg/dL (ref 0.6–1.1)
EGFR: >90 mL/min/{1.73_m2} (ref >90)
Glucose: 216 mg/dl — ABNORMAL HIGH (ref 70–140)
POTASSIUM: 4.2 meq/L (ref 3.5–5.1)
Sodium: 137 mEq/L (ref 136–145)
Total Bilirubin: 0.63 mg/dL (ref 0.20–1.20)
Total Protein: 6.6 g/dL (ref 6.4–8.3)

## 2014-02-09 LAB — CBC WITH DIFFERENTIAL/PLATELET
BASO%: 0.5 % (ref 0.0–2.0)
BASOS ABS: 0 10*3/uL (ref 0.0–0.1)
EOS%: 0.9 % (ref 0.0–7.0)
Eosinophils Absolute: 0.1 10*3/uL (ref 0.0–0.5)
HEMATOCRIT: 35.1 % (ref 34.8–46.6)
HEMOGLOBIN: 11.3 g/dL — AB (ref 11.6–15.9)
LYMPH%: 9.8 % — ABNORMAL LOW (ref 14.0–49.7)
MCH: 28.8 pg (ref 25.1–34.0)
MCHC: 32.1 g/dL (ref 31.5–36.0)
MCV: 89.5 fL (ref 79.5–101.0)
MONO#: 0.2 10*3/uL (ref 0.1–0.9)
MONO%: 2.8 % (ref 0.0–14.0)
NEUT#: 5.3 10*3/uL (ref 1.5–6.5)
NEUT%: 86 % — ABNORMAL HIGH (ref 38.4–76.8)
Platelets: 155 10*3/uL (ref 145–400)
RBC: 3.92 10*6/uL (ref 3.70–5.45)
RDW: 15.3 % — ABNORMAL HIGH (ref 11.2–14.5)
WBC: 6.2 10*3/uL (ref 3.9–10.3)
lymph#: 0.6 10*3/uL — ABNORMAL LOW (ref 0.9–3.3)

## 2014-02-09 MED ORDER — ONDANSETRON HCL 8 MG PO TABS
ORAL_TABLET | ORAL | Status: AC
  Filled 2014-02-09: qty 1

## 2014-02-09 MED ORDER — ONDANSETRON HCL 8 MG PO TABS
8.0000 mg | ORAL_TABLET | Freq: Once | ORAL | Status: AC
  Administered 2014-02-09: 8 mg via ORAL

## 2014-02-09 MED ORDER — BORTEZOMIB CHEMO SQ INJECTION 3.5 MG (2.5MG/ML)
1.3000 mg/m2 | Freq: Once | INTRAMUSCULAR | Status: AC
  Administered 2014-02-09: 3.25 mg via SUBCUTANEOUS
  Filled 2014-02-09: qty 3.25

## 2014-02-09 NOTE — Telephone Encounter (Signed)
Pt confirmed labs/ov per 02/02 POF, gave pt AVS.... KJ, sent msg to add chemo.... °

## 2014-02-09 NOTE — Patient Instructions (Signed)
[facility] Discharge Instructions for Patients Receiving Chemotherapy  Today you received the following chemotherapy agents: Velcade.  To help prevent nausea and vomiting after your treatment, we encourage you to take your nausea medication as prescribed.   If you develop nausea and vomiting that is not controlled by your nausea medication, call the clinic.   BELOW ARE SYMPTOMS THAT SHOULD BE REPORTED IMMEDIATELY:  *FEVER GREATER THAN 100.5 F  *CHILLS WITH OR WITHOUT FEVER  NAUSEA AND VOMITING THAT IS NOT CONTROLLED WITH YOUR NAUSEA MEDICATION  *UNUSUAL SHORTNESS OF BREATH  *UNUSUAL BRUISING OR BLEEDING  TENDERNESS IN MOUTH AND THROAT WITH OR WITHOUT PRESENCE OF ULCERS  *URINARY PROBLEMS  *BOWEL PROBLEMS  UNUSUAL RASH Items with * indicate a potential emergency and should be followed up as soon as possible.  Feel free to call the clinic you have any questions or concerns. The clinic phone number is [phone].    

## 2014-02-09 NOTE — Progress Notes (Signed)
[place] [phone]   [phone]  OFFICE PROGRESS NOTE  [name], MD [place] [address]  DIAGNOSIS: MGUS diagnosed in September 2010, with additional symptoms suggestive of POEMS syndrome.   CURRENT THERAPY: Velcade 1.3 MG/M2 subcutaneously with Decadron 40 mg by mouth on a weekly basis. First cycle 11/24/2013.  INTERVAL HISTORY: Brenda Conner 54 y.o. female returns to the clinic today for followup visit. The patient is feeling fine today with no specific complaints. She is tolerating her current treatment with subcutaneous Velcade and Decadron fairly well except for 2 days of fatigue after the treatment. She denied having any significant weight loss or night sweats. The patient denied having any fever or chills. She has no chest pain, shortness of breath, cough or hemoptysis. She denied having any aching pain. She had repeat myeloma panel and she is here for evaluation and discussion of her lab results.  MEDICAL HISTORY: Past Medical History  Diagnosis Date  . PONV (postoperative nausea and vomiting)   . Hypertension   . Anxiety   . Depression   . Asthma   . Sleep apnea     CPAP at bedtime  . Cancer     waldenstroms/ macroglobinulemia  . Hypercholesteremia   . Hypothyroidism   . Depression   . Macroglobulinemia     ? POEMS syndrome  . Acid reflux   . Multiple myeloma     ALLERGIES:  is allergic to codeine; hydrocodone; lortab; and amoxicillin.  MEDICATIONS:  Current Outpatient Prescriptions  Medication Sig Dispense Refill  . acyclovir (ZOVIRAX) 400 MG tablet Take 1 tablet (400 mg total) by mouth 2 (two) times daily. 60 tablet 3  . ALPRAZolam (XANAX) 0.5 MG tablet Take 0.5 mg by mouth at bedtime as needed for anxiety.     [name] aspirin 81 MG tablet Take 81 mg by mouth daily.    . Azelastine HCl 0.15 % SOLN as needed.    . Blood Glucose Monitoring Suppl (ONE TOUCH ULTRA MINI) W/DEVICE KIT See admin  instructions.  0  . calcium-vitamin D 250-100 MG-UNIT per tablet Take 1 tablet by mouth 2 (two) times daily.    . cetirizine (ZYRTEC) 10 MG tablet Take 10 mg by mouth at bedtime.     [name] dexamethasone (DECADRON) 4 MG tablet 10 tablet by mouth every week. Start with the first dose of chemotherapy. 40 tablet 3  . fexofenadine (ALLEGRA) 180 MG tablet Take 180 mg by mouth every morning.    . Fluticasone-Salmeterol (ADVAIR) 100-50 MCG/DOSE AEPB Inhale 2 puffs into the lungs every 12 (twelve) hours.    [name] ibuprofen (ADVIL,MOTRIN) 100 MG tablet Take 100 mg by mouth every 6 (six) hours as needed.    [name] levothyroxine (SYNTHROID, LEVOTHROID) 125 MCG tablet Take 125 mcg by mouth daily before breakfast.     . metFORMIN (GLUCOPHAGE) 500 MG tablet Take 1,000 mg by mouth 2 (two) times daily.  3  . Multiple Vitamins-Minerals (MULTIVITAMIN PO) Take by mouth.    [name] omeprazole (PRILOSEC) 40 MG capsule Take 40 mg by mouth 2 (two) times daily.     . ondansetron (ZOFRAN-ODT) 8 MG disintegrating tablet Take 8 mg by mouth every 8 (eight) hours as needed.  2  . ONE TOUCH ULTRA TEST test strip See admin instructions.  11  . pravastatin (PRAVACHOL) 40 MG tablet Take 40 mg by mouth every evening.     . sertraline (ZOLOFT) 50 MG tablet 3 tablets (  175m) po daily 90 tablet 12  . terconazole (TERAZOL 7) 0.4 % vaginal cream   0  . UNABLE TO FIND Med Name: Allergy shots    . verapamil (CALAN-SR) 240 MG CR tablet Take 240 mg by mouth 2 (two) times daily.    .Marland KitchenVITAMIN D, CHOLECALCIFEROL, PO Take 4,000 Int'l Units by mouth daily.    . prochlorperazine (COMPAZINE) 10 MG tablet Take 1 tablet (10 mg total) by mouth every 6 (six) hours as needed for nausea or vomiting. (Patient not taking: Reported on 02/09/2014) 30 tablet 0   No current facility-administered medications for this visit.    SURGICAL HISTORY:  Past Surgical History  Procedure Laterality Date  . Cholecystectomy    . Back surgery  03/2004    herniation, L4-L5  . Bil  laprascopic knee surgery    . Uterine fibroid embolization    . Bone marrow biopsy      2011  . Laparoscopic cholecystectomy  1997  . Foot surgery  1998  . Nasal sinus surgery  2004  . Ablation  09/2007    HTA and polyp resection  . Knee arthroscopy w/ meniscal repair  10/10 ; 3/11  . Bone marrow biopsy  4/14    REVIEW OF SYSTEMS:  A comprehensive review of systems was negative.   PHYSICAL EXAMINATION: General appearance: alert, cooperative and no distress Head: Normocephalic, without obvious abnormality, atraumatic Neck: no adenopathy Lymph nodes: Cervical, supraclavicular, and axillary nodes normal. Resp: clear to auscultation bilaterally Cardio: regular rate and rhythm, S1, S2 normal, no murmur, click, rub or gallop GI: soft, non-tender; bowel sounds normal; no masses,  no organomegaly Extremities: extremities normal, atraumatic, no cyanosis or edema  ECOG PERFORMANCE STATUS: 0 - Asymptomatic  Blood pressure 132/67, pulse 70, temperature 97.8 F (36.6 C), temperature source Oral, resp. rate 18, height _0  (1.702 m), weight 258 lb 11.2 oz (117.346 kg), SpO2 99 %.  LABORATORY DATA: Lab Results  Component Value Date   WBC 6.2 02/09/2014   HGB 11.3* 02/09/2014   HCT 35.1 02/09/2014   MCV 89.5 02/09/2014   PLT 155 02/09/2014      Chemistry      Component Value Date/Time   NA 137 02/09/2014 1145   NA 137 03/20/2011 1509   K 4.2 02/09/2014 1145   K 3.4* 03/20/2011 1509   CL 104 04/07/2012 1415   CL 102 03/20/2011 1509   CO2 21* 02/09/2014 1145   CO2 26 03/20/2011 1509   BUN 7.3 02/09/2014 1145   BUN 7 03/20/2011 1509   CREATININE 0.8 02/09/2014 1145   CREATININE 0.63 03/20/2011 1509      Component Value Date/Time   CALCIUM 8.9 02/09/2014 1145   CALCIUM 9.2 03/20/2011 1509   ALKPHOS 97 02/09/2014 1145   ALKPHOS 74 03/20/2011 1509   AST 51* 02/09/2014 1145   AST 24 03/20/2011 1509   ALT 53 02/09/2014 1145   ALT 26 03/20/2011 1509   BILITOT 0.63 02/09/2014  1145   BILITOT 0.3 03/20/2011 1509     Other lab results: Beta-2 microglobulin 2.05, IgG 330, IgA 77 and IgM 805. Kappa chain 0.30, free lambda light chain 66.10, kappa/lambda ratio 0.00.  ASSESSMENT AND PLAN: This is a very pleasant 54years old white female with a multiple myeloma initially diagnosed as monoclonal gammopathy in addition to peripheral neuropathy, and Endocrinopathy in the form of hypothyroidism and skin rash. The symptoms combined are suspicious for POEMS syndrome.  She is currently on treatment with  subcutaneous tenderness Velcade and Decadron status post 10 cycles and tolerated her treatment fairly well. The recent myeloma panel showed improvement in her disease. I discussed the lab result with the patient today and recommended for her to continue her  treatment with subcutaneous Velcade 1.3 MG/M2 weekly with Decadron 40 mg by mouth on a weekly basis.  She will come back for follow-up visit in 3 weeks for reevaluation and management of any adverse effect of her treatment. She was advised to call immediately if her symptoms started getting worse as I may consider the patient for treatment with multiple myeloma regimen. The patient agreed to the current plan.  All questions were answered. The patient knows to call the clinic with any problems, questions or concerns. We can certainly see the patient much sooner if necessary.  Disclaimer: This note was dictated with voice recognition software. Similar sounding words can inadvertently be transcribed and may not be corrected upon review.

## 2014-02-10 ENCOUNTER — Telehealth: Payer: Self-pay | Admitting: *Deleted

## 2014-02-10 NOTE — Telephone Encounter (Signed)
Per staff message and POF I have scheduled appts. Advised scheduler of appts. JMW  

## 2014-02-16 ENCOUNTER — Other Ambulatory Visit (HOSPITAL_BASED_OUTPATIENT_CLINIC_OR_DEPARTMENT_OTHER): Payer: BLUE CROSS/BLUE SHIELD

## 2014-02-16 ENCOUNTER — Ambulatory Visit (HOSPITAL_BASED_OUTPATIENT_CLINIC_OR_DEPARTMENT_OTHER): Payer: BLUE CROSS/BLUE SHIELD

## 2014-02-16 DIAGNOSIS — C9000 Multiple myeloma not having achieved remission: Secondary | ICD-10-CM

## 2014-02-16 DIAGNOSIS — Z5112 Encounter for antineoplastic immunotherapy: Secondary | ICD-10-CM

## 2014-02-16 LAB — COMPREHENSIVE METABOLIC PANEL (CC13)
ALT: 41 U/L (ref 0–55)
AST: 31 U/L (ref 5–34)
Albumin: 3.7 g/dL (ref 3.5–5.0)
Alkaline Phosphatase: 85 U/L (ref 40–150)
Anion Gap: 12 mEq/L — ABNORMAL HIGH (ref 3–11)
BILIRUBIN TOTAL: 0.57 mg/dL (ref 0.20–1.20)
BUN: 9.8 mg/dL (ref 7.0–26.0)
CO2: 23 meq/L (ref 22–29)
Calcium: 9.5 mg/dL (ref 8.4–10.4)
Chloride: 102 mEq/L (ref 98–109)
Creatinine: 0.8 mg/dL (ref 0.6–1.1)
EGFR: 88 mL/min/{1.73_m2} — AB (ref >90)
Glucose: 146 mg/dl — ABNORMAL HIGH (ref 70–140)
Potassium: 4 mEq/L (ref 3.5–5.1)
SODIUM: 138 meq/L (ref 136–145)
TOTAL PROTEIN: 6.5 g/dL (ref 6.4–8.3)

## 2014-02-16 LAB — CBC WITH DIFFERENTIAL/PLATELET
BASO%: 0.2 % (ref 0.0–2.0)
BASOS ABS: 0 10*3/uL (ref 0.0–0.1)
EOS ABS: 0.1 10*3/uL (ref 0.0–0.5)
EOS%: 1.1 % (ref 0.0–7.0)
HCT: 34.6 % — ABNORMAL LOW (ref 34.8–46.6)
HGB: 11.4 g/dL — ABNORMAL LOW (ref 11.6–15.9)
LYMPH%: 16.3 % (ref 14.0–49.7)
MCH: 29.7 pg (ref 25.1–34.0)
MCHC: 32.9 g/dL (ref 31.5–36.0)
MCV: 90.1 fL (ref 79.5–101.0)
MONO#: 0.4 10*3/uL (ref 0.1–0.9)
MONO%: 6.8 % (ref 0.0–14.0)
NEUT#: 4.6 10*3/uL (ref 1.5–6.5)
NEUT%: 75.6 % (ref 38.4–76.8)
Platelets: 141 10*3/uL — ABNORMAL LOW (ref 145–400)
RBC: 3.84 10*6/uL (ref 3.70–5.45)
RDW: 14.6 % — ABNORMAL HIGH (ref 11.2–14.5)
WBC: 6.1 10*3/uL (ref 3.9–10.3)
lymph#: 1 10*3/uL (ref 0.9–3.3)

## 2014-02-16 MED ORDER — ONDANSETRON HCL 8 MG PO TABS
8.0000 mg | ORAL_TABLET | Freq: Once | ORAL | Status: AC
  Administered 2014-02-16: 8 mg via ORAL

## 2014-02-16 MED ORDER — BORTEZOMIB CHEMO SQ INJECTION 3.5 MG (2.5MG/ML)
1.3000 mg/m2 | Freq: Once | INTRAMUSCULAR | Status: AC
  Administered 2014-02-16: 3.25 mg via SUBCUTANEOUS
  Filled 2014-02-16: qty 3.25

## 2014-02-16 MED ORDER — ONDANSETRON HCL 8 MG PO TABS
ORAL_TABLET | ORAL | Status: AC
  Filled 2014-02-16: qty 1

## 2014-02-16 NOTE — Patient Instructions (Signed)
[facility] Discharge Instructions for Patients Receiving Chemotherapy  Today you received the following chemotherapy agents velcade   To help prevent nausea and vomiting after your treatment, we encourage you to take your nausea medication as directed  If you develop nausea and vomiting that is not controlled by your nausea medication, call the clinic.   BELOW ARE SYMPTOMS THAT SHOULD BE REPORTED IMMEDIATELY:  *FEVER GREATER THAN 100.5 F  *CHILLS WITH OR WITHOUT FEVER  NAUSEA AND VOMITING THAT IS NOT CONTROLLED WITH YOUR NAUSEA MEDICATION  *UNUSUAL SHORTNESS OF BREATH  *UNUSUAL BRUISING OR BLEEDING  TENDERNESS IN MOUTH AND THROAT WITH OR WITHOUT PRESENCE OF ULCERS  *URINARY PROBLEMS  *BOWEL PROBLEMS  UNUSUAL RASH Items with * indicate a potential emergency and should be followed up as soon as possible.  Feel free to call the clinic you have any questions or concerns. The clinic phone number is [phone].  

## 2014-02-22 ENCOUNTER — Ambulatory Visit (HOSPITAL_BASED_OUTPATIENT_CLINIC_OR_DEPARTMENT_OTHER): Payer: BLUE CROSS/BLUE SHIELD

## 2014-02-22 ENCOUNTER — Ambulatory Visit: Payer: BLUE CROSS/BLUE SHIELD | Admitting: Nutrition

## 2014-02-22 ENCOUNTER — Other Ambulatory Visit (HOSPITAL_BASED_OUTPATIENT_CLINIC_OR_DEPARTMENT_OTHER): Payer: BLUE CROSS/BLUE SHIELD

## 2014-02-22 ENCOUNTER — Ambulatory Visit: Payer: BLUE CROSS/BLUE SHIELD | Admitting: Physician Assistant

## 2014-02-22 ENCOUNTER — Other Ambulatory Visit: Payer: BLUE CROSS/BLUE SHIELD

## 2014-02-22 DIAGNOSIS — C9000 Multiple myeloma not having achieved remission: Secondary | ICD-10-CM

## 2014-02-22 DIAGNOSIS — Z5112 Encounter for antineoplastic immunotherapy: Secondary | ICD-10-CM

## 2014-02-22 LAB — CBC WITH DIFFERENTIAL/PLATELET
BASO%: 0.6 % (ref 0.0–2.0)
BASOS ABS: 0 10*3/uL (ref 0.0–0.1)
EOS ABS: 0.1 10*3/uL (ref 0.0–0.5)
EOS%: 1.2 % (ref 0.0–7.0)
HCT: 34.6 % — ABNORMAL LOW (ref 34.8–46.6)
HEMOGLOBIN: 11.2 g/dL — AB (ref 11.6–15.9)
LYMPH%: 14.3 % (ref 14.0–49.7)
MCH: 28.8 pg (ref 25.1–34.0)
MCHC: 32.3 g/dL (ref 31.5–36.0)
MCV: 89.2 fL (ref 79.5–101.0)
MONO#: 0.4 10*3/uL (ref 0.1–0.9)
MONO%: 7 % (ref 0.0–14.0)
NEUT%: 76.9 % — ABNORMAL HIGH (ref 38.4–76.8)
NEUTROS ABS: 4.8 10*3/uL (ref 1.5–6.5)
PLATELETS: 145 10*3/uL (ref 145–400)
RBC: 3.88 10*6/uL (ref 3.70–5.45)
RDW: 15.2 % — AB (ref 11.2–14.5)
WBC: 6.3 10*3/uL (ref 3.9–10.3)
lymph#: 0.9 10*3/uL (ref 0.9–3.3)

## 2014-02-22 LAB — COMPREHENSIVE METABOLIC PANEL (CC13)
ALT: 34 U/L (ref 0–55)
AST: 21 U/L (ref 5–34)
Albumin: 3.6 g/dL (ref 3.5–5.0)
Alkaline Phosphatase: 86 U/L (ref 40–150)
Anion Gap: 11 mEq/L (ref 3–11)
BUN: 8.6 mg/dL (ref 7.0–26.0)
CHLORIDE: 103 meq/L (ref 98–109)
CO2: 23 mEq/L (ref 22–29)
Calcium: 9.5 mg/dL (ref 8.4–10.4)
Creatinine: 0.7 mg/dL (ref 0.6–1.1)
EGFR: >90 mL/min/{1.73_m2} (ref >90)
GLUCOSE: 160 mg/dL — AB (ref 70–140)
Potassium: 3.9 mEq/L (ref 3.5–5.1)
Sodium: 137 mEq/L (ref 136–145)
Total Bilirubin: 0.57 mg/dL (ref 0.20–1.20)
Total Protein: 6.4 g/dL (ref 6.4–8.3)

## 2014-02-22 MED ORDER — ONDANSETRON HCL 8 MG PO TABS
8.0000 mg | ORAL_TABLET | Freq: Once | ORAL | Status: AC
  Administered 2014-02-22: 8 mg via ORAL

## 2014-02-22 MED ORDER — BORTEZOMIB CHEMO SQ INJECTION 3.5 MG (2.5MG/ML)
1.3000 mg/m2 | Freq: Once | INTRAMUSCULAR | Status: AC
  Administered 2014-02-22: 3.25 mg via SUBCUTANEOUS
  Filled 2014-02-22: qty 3.25

## 2014-02-22 MED ORDER — ONDANSETRON HCL 8 MG PO TABS
ORAL_TABLET | ORAL | Status: AC
  Filled 2014-02-22: qty 1

## 2014-02-22 NOTE — Patient Instructions (Signed)
[facility] Discharge Instructions for Patients Receiving Chemotherapy  Today you received the following chemotherapy agents: Velcade.  To help prevent nausea and vomiting after your treatment, we encourage you to take your nausea medication as prescribed.   If you develop nausea and vomiting that is not controlled by your nausea medication, call the clinic.   BELOW ARE SYMPTOMS THAT SHOULD BE REPORTED IMMEDIATELY:  *FEVER GREATER THAN 100.5 F  *CHILLS WITH OR WITHOUT FEVER  NAUSEA AND VOMITING THAT IS NOT CONTROLLED WITH YOUR NAUSEA MEDICATION  *UNUSUAL SHORTNESS OF BREATH  *UNUSUAL BRUISING OR BLEEDING  TENDERNESS IN MOUTH AND THROAT WITH OR WITHOUT PRESENCE OF ULCERS  *URINARY PROBLEMS  *BOWEL PROBLEMS  UNUSUAL RASH Items with * indicate a potential emergency and should be followed up as soon as possible.  Feel free to call the clinic you have any questions or concerns. The clinic phone number is [phone].    

## 2014-02-22 NOTE — Progress Notes (Signed)
Patient was identified to be at risk for malnutrition on the MST secondary to poor appetite and weight loss.  Spoke with patient who reports she has a good appetite. Weight documented as 275 pounds July 2015.  Patient weight 258 pounds February 2.  This is a 17 pound weight loss over 8 months. Reports weight loss is a combination of new diagnosis of diabetes and trying to lose weight. Patient denies nutrition side effects from chemotherapy. Patient states she has an [facility]. Educated patient to be sure she is consuming adequate protein at meals. Educated patient on protein sources in her diet. Agree with further education on carbohydrate controlled diet by [facility]. Patient will contact me for further questions regarding nutrition impact symptoms and cancer therapy.  **Disclaimer: This note was dictated with voice recognition software. Similar sounding words can inadvertently be transcribed and this note may contain transcription errors which may not have been corrected upon publication of note.**

## 2014-03-02 ENCOUNTER — Encounter: Payer: Self-pay | Admitting: Physician Assistant

## 2014-03-02 ENCOUNTER — Ambulatory Visit (HOSPITAL_BASED_OUTPATIENT_CLINIC_OR_DEPARTMENT_OTHER): Payer: BLUE CROSS/BLUE SHIELD

## 2014-03-02 ENCOUNTER — Other Ambulatory Visit (HOSPITAL_BASED_OUTPATIENT_CLINIC_OR_DEPARTMENT_OTHER): Payer: BLUE CROSS/BLUE SHIELD

## 2014-03-02 ENCOUNTER — Ambulatory Visit (HOSPITAL_BASED_OUTPATIENT_CLINIC_OR_DEPARTMENT_OTHER): Payer: BLUE CROSS/BLUE SHIELD | Admitting: Physician Assistant

## 2014-03-02 ENCOUNTER — Other Ambulatory Visit: Payer: BLUE CROSS/BLUE SHIELD

## 2014-03-02 ENCOUNTER — Telehealth: Payer: Self-pay | Admitting: Physician Assistant

## 2014-03-02 VITALS — BP 126/54 | HR 81 | Temp 98.6°F | Resp 18 | Wt 258.0 lb

## 2014-03-02 DIAGNOSIS — C9000 Multiple myeloma not having achieved remission: Secondary | ICD-10-CM

## 2014-03-02 DIAGNOSIS — Z5112 Encounter for antineoplastic immunotherapy: Secondary | ICD-10-CM

## 2014-03-02 LAB — COMPREHENSIVE METABOLIC PANEL (CC13)
ALT: 36 U/L (ref 0–55)
AST: 34 U/L (ref 5–34)
Albumin: 3.8 g/dL (ref 3.5–5.0)
Alkaline Phosphatase: 84 U/L (ref 40–150)
Anion Gap: 11 mEq/L (ref 3–11)
BILIRUBIN TOTAL: 0.55 mg/dL (ref 0.20–1.20)
BUN: 7.2 mg/dL (ref 7.0–26.0)
CO2: 23 mEq/L (ref 22–29)
Calcium: 9.2 mg/dL (ref 8.4–10.4)
Chloride: 105 mEq/L (ref 98–109)
Creatinine: 0.7 mg/dL (ref 0.6–1.1)
Glucose: 110 mg/dl (ref 70–140)
POTASSIUM: 3.9 meq/L (ref 3.5–5.1)
Sodium: 139 mEq/L (ref 136–145)
TOTAL PROTEIN: 6.8 g/dL (ref 6.4–8.3)

## 2014-03-02 LAB — CBC WITH DIFFERENTIAL/PLATELET
BASO%: 0.1 % (ref 0.0–2.0)
BASOS ABS: 0 10*3/uL (ref 0.0–0.1)
EOS ABS: 0.1 10*3/uL (ref 0.0–0.5)
EOS%: 1.2 % (ref 0.0–7.0)
HEMATOCRIT: 36.7 % (ref 34.8–46.6)
HEMOGLOBIN: 12.1 g/dL (ref 11.6–15.9)
LYMPH#: 1.2 10*3/uL (ref 0.9–3.3)
LYMPH%: 16.8 % (ref 14.0–49.7)
MCH: 29.9 pg (ref 25.1–34.0)
MCHC: 33 g/dL (ref 31.5–36.0)
MCV: 90.6 fL (ref 79.5–101.0)
MONO#: 0.4 10*3/uL (ref 0.1–0.9)
MONO%: 5 % (ref 0.0–14.0)
NEUT#: 5.6 10*3/uL (ref 1.5–6.5)
NEUT%: 76.9 % — AB (ref 38.4–76.8)
Platelets: 164 10*3/uL (ref 145–400)
RBC: 4.05 10*6/uL (ref 3.70–5.45)
RDW: 14.4 % (ref 11.2–14.5)
WBC: 7.3 10*3/uL (ref 3.9–10.3)

## 2014-03-02 MED ORDER — BORTEZOMIB CHEMO SQ INJECTION 3.5 MG (2.5MG/ML)
1.3000 mg/m2 | Freq: Once | INTRAMUSCULAR | Status: AC
  Administered 2014-03-02: 3.25 mg via SUBCUTANEOUS
  Filled 2014-03-02: qty 3.25

## 2014-03-02 MED ORDER — DEXAMETHASONE 4 MG PO TABS: ORAL_TABLET | ORAL | Status: DC

## 2014-03-02 MED ORDER — ACYCLOVIR 400 MG PO TABS: 400.0000 mg | ORAL_TABLET | Freq: Two times a day (BID) | ORAL | Status: DC

## 2014-03-02 MED ORDER — ONDANSETRON HCL 8 MG PO TABS
8.0000 mg | ORAL_TABLET | Freq: Once | ORAL | Status: AC
  Administered 2014-03-02: 8 mg via ORAL

## 2014-03-02 MED ORDER — ONDANSETRON HCL 8 MG PO TABS
ORAL_TABLET | ORAL | Status: AC
  Filled 2014-03-02: qty 1

## 2014-03-02 NOTE — Telephone Encounter (Signed)
Gave avs & calendar for March. Sent MD message to verify appointment.

## 2014-03-02 NOTE — Progress Notes (Addendum)
[place] [phone]   [phone]) [phone]  OFFICE PROGRESS NOTE  [name], MD [place] [address]  DIAGNOSIS: Plasma cell dyscrasia initially diagnosed as MGUS diagnosed in September 2010, with additional symptoms suggestive of POEMS syndrome.   CURRENT THERAPY: Velcade 1.3 MG/M2 subcutaneously with Decadron 40 mg by mouth on a weekly basis. First cycle 11/24/2013. Status post 13 cycles.  INTERVAL HISTORY: Brenda Conner 54 y.o. female returns to the clinic today for followup visit. The patient is feeling fine today with no complaints, except for mild ankle swelling and some nausea. The nausea is well-controlled with her current antiemetic. She is tolerating her current treatment with subcutaneous Velcade and Decadron fairly well except for 2 days of fatigue after the treatment. She denied having any significant weight loss or night sweats. The patient denied having any fever or chills. She has no chest pain, shortness of breath, cough or hemoptysis. She denied having any aching pain. She requests refill prescriptions for her acyclovir and dexamethasone.  MEDICAL HISTORY: Past Medical History  Diagnosis Date  . PONV (postoperative nausea and vomiting)   . Hypertension   . Anxiety   . Depression   . Asthma   . Sleep apnea     CPAP at bedtime  . Cancer     waldenstroms/ macroglobinulemia  . Hypercholesteremia   . Hypothyroidism   . Depression   . Macroglobulinemia     ? POEMS syndrome  . Acid reflux   . Multiple myeloma     ALLERGIES:  is allergic to codeine; hydrocodone; lortab; and amoxicillin.  MEDICATIONS:  Current Outpatient Prescriptions  Medication Sig Dispense Refill  . acyclovir (ZOVIRAX) 400 MG tablet Take 1 tablet (400 mg total) by mouth 2 (two) times daily. 60 tablet 3  . ALPRAZolam (XANAX) 0.5 MG tablet Take 0.5 mg by mouth at bedtime as needed for anxiety.     [name] aspirin 81 MG tablet Take 81 mg by  mouth daily.    . Azelastine HCl 0.15 % SOLN as needed.    . Blood Glucose Monitoring Suppl (ONE TOUCH ULTRA MINI) W/DEVICE KIT See admin instructions.  0  . calcium-vitamin D 250-100 MG-UNIT per tablet Take 1 tablet by mouth 2 (two) times daily.    . cetirizine (ZYRTEC) 10 MG tablet Take 10 mg by mouth at bedtime.     [name] dexamethasone (DECADRON) 4 MG tablet 10 tablet by mouth every week. Start with the first dose of chemotherapy. 40 tablet 3  . fexofenadine (ALLEGRA) 180 MG tablet Take 180 mg by mouth every morning.    . Fluticasone-Salmeterol (ADVAIR) 100-50 MCG/DOSE AEPB Inhale 2 puffs into the lungs every 12 (twelve) hours.    [name] ibuprofen (ADVIL,MOTRIN) 100 MG tablet Take 100 mg by mouth every 6 (six) hours as needed.    [name] levothyroxine (SYNTHROID, LEVOTHROID) 125 MCG tablet Take 125 mcg by mouth daily before breakfast.     . metFORMIN (GLUCOPHAGE) 500 MG tablet Take 1,000 mg by mouth 2 (two) times daily.  3  . Multiple Vitamins-Minerals (MULTIVITAMIN PO) Take by mouth.    [name] omeprazole (PRILOSEC) 40 MG capsule Take 40 mg by mouth 2 (two) times daily.     . ondansetron (ZOFRAN-ODT) 8 MG disintegrating tablet Take 8 mg by mouth every 8 (eight) hours as needed.  2  . ONE TOUCH ULTRA TEST test strip See admin instructions.  11  . pravastatin (PRAVACHOL) 40 MG tablet Take 40  mg by mouth every evening.     . prochlorperazine (COMPAZINE) 10 MG tablet Take 1 tablet (10 mg total) by mouth every 6 (six) hours as needed for nausea or vomiting. 30 tablet 0  . sertraline (ZOLOFT) 50 MG tablet 3 tablets (153m) po daily 90 tablet 12  . terconazole (TERAZOL 7) 0.4 % vaginal cream   0  . UNABLE TO FIND Med Name: Allergy shots    . verapamil (CALAN-SR) 240 MG CR tablet Take 240 mg by mouth 2 (two) times daily.    .Marland KitchenVITAMIN D, CHOLECALCIFEROL, PO Take 4,000 Int'l Units by mouth daily.     No current facility-administered medications for this visit.    SURGICAL HISTORY:  Past Surgical History  Procedure  Laterality Date  . Cholecystectomy    . Back surgery  03/2004    herniation, L4-L5  . Bil laprascopic knee surgery    . Uterine fibroid embolization    . Bone marrow biopsy      2011  . Laparoscopic cholecystectomy  1997  . Foot surgery  1998  . Nasal sinus surgery  2004  . Ablation  09/2007    HTA and polyp resection  . Knee arthroscopy w/ meniscal repair  10/10 ; 3/11  . Bone marrow biopsy  4/14    REVIEW OF SYSTEMS:  A comprehensive review of systems was negative.   PHYSICAL EXAMINATION: General appearance: alert, cooperative and no distress Head: Normocephalic, without obvious abnormality, atraumatic Neck: no adenopathy Lymph nodes: Cervical, supraclavicular, and axillary nodes normal. Resp: clear to auscultation bilaterally Cardio: regular rate and rhythm, S1, S2 normal, no murmur, click, rub or gallop GI: soft, non-tender; bowel sounds normal; no masses,  no organomegaly Extremities: extremities normal, atraumatic, no cyanosis or edema  ECOG PERFORMANCE STATUS: 0 - Asymptomatic  Blood pressure 126/54, pulse 81, temperature 98.6 F (37 C), temperature source Oral, resp. rate 18, weight 258 lb (117.028 kg), SpO2 98 %.  LABORATORY DATA: Lab Results  Component Value Date   WBC 7.3 03/02/2014   HGB 12.1 03/02/2014   HCT 36.7 03/02/2014   MCV 90.6 03/02/2014   PLT 164 03/02/2014      Chemistry      Component Value Date/Time   NA 139 03/02/2014 1142   NA 137 03/20/2011 1509   K 3.9 03/02/2014 1142   K 3.4* 03/20/2011 1509   CL 104 04/07/2012 1415   CL 102 03/20/2011 1509   CO2 23 03/02/2014 1142   CO2 26 03/20/2011 1509   BUN 7.2 03/02/2014 1142   BUN 7 03/20/2011 1509   CREATININE 0.7 03/02/2014 1142   CREATININE 0.63 03/20/2011 1509      Component Value Date/Time   CALCIUM 9.2 03/02/2014 1142   CALCIUM 9.2 03/20/2011 1509   ALKPHOS 84 03/02/2014 1142   ALKPHOS 74 03/20/2011 1509   AST 34 03/02/2014 1142   AST 24 03/20/2011 1509   ALT 36 03/02/2014 1142    ALT 26 03/20/2011 1509   BILITOT 0.55 03/02/2014 1142   BILITOT 0.3 03/20/2011 1509     Other lab results: Beta-2 microglobulin 2.05, IgG 330, IgA 77 and IgM 805. Kappa chain 0.30, free lambda light chain 66.10, kappa/lambda ratio 0.00.  ASSESSMENT AND PLAN: This is a very pleasant 54years old white female with a multiple myeloma initially diagnosed as monoclonal gammopathy in addition to peripheral neuropathy, and Endocrinopathy in the form of hypothyroidism and skin rash. The symptoms combined are suspicious for POEMS syndrome.  She is  currently on treatment with subcutaneous tenderness Velcade and Decadron status post 13 cycles and tolerated her treatment fairly well. The recent myeloma panel showed improvement in her disease. Patient was discussed with and also seen by Dr. Julien Nordmann. She will continue on her  treatment with subcutaneous Velcade 1.3 MG/M2 weekly with Decadron 40 mg by mouth on a weekly basis.  She will follow-up in 3 weeks for another symptom management visit. Refill prescriptions for her acyclovir and dexamethasone were sent to her pharmacy of record via E scribe. w  She was advised to call immediately if her symptoms started getting worse as I may consider the patient for treatment with multiple myeloma regimen. The patient agreed to the current plan.  All questions were answered. The patient knows to call the clinic with any problems, questions or concerns. We can certainly see the patient much sooner if necessary.  Carlton Adam, PA-C 03/02/2014  ADDENDUM: Hematology/Oncology Attending: I had a face to face encounter with the patient. I recommended her care plan. This is a very pleasant 54 years old white female with plasma cell dyscrasia currently undergoing systemic therapy with weekly subcutaneous Velcade and Decadron status post 13 cycles and tolerating her treatment fairly well. I recommended for the patient to continue her current treatment as scheduled.  She will come back for follow-up visit in 3 weeks for reevaluation and management of any adverse effect of her treatment. She was advised to call immediately if she has any concerning symptoms in the interval.  Disclaimer: This note was dictated with voice recognition software. Similar sounding words can inadvertently be transcribed and may not be corrected upon review. Eilleen Kempf., MD 03/06/2014

## 2014-03-02 NOTE — Patient Instructions (Signed)
[facility] Discharge Instructions for Patients Receiving Chemotherapy  Today you received the following chemotherapy agents: Velcade.  To help prevent nausea and vomiting after your treatment, we encourage you to take your nausea medication as prescribed.   If you develop nausea and vomiting that is not controlled by your nausea medication, call the clinic.   BELOW ARE SYMPTOMS THAT SHOULD BE REPORTED IMMEDIATELY:  *FEVER GREATER THAN 100.5 F  *CHILLS WITH OR WITHOUT FEVER  NAUSEA AND VOMITING THAT IS NOT CONTROLLED WITH YOUR NAUSEA MEDICATION  *UNUSUAL SHORTNESS OF BREATH  *UNUSUAL BRUISING OR BLEEDING  TENDERNESS IN MOUTH AND THROAT WITH OR WITHOUT PRESENCE OF ULCERS  *URINARY PROBLEMS  *BOWEL PROBLEMS  UNUSUAL RASH Items with * indicate a potential emergency and should be followed up as soon as possible.  Feel free to call the clinic you have any questions or concerns. The clinic phone number is [phone].    

## 2014-03-03 ENCOUNTER — Telehealth: Payer: Self-pay | Admitting: *Deleted

## 2014-03-03 ENCOUNTER — Telehealth: Payer: Self-pay | Admitting: Internal Medicine

## 2014-03-03 NOTE — Telephone Encounter (Signed)
Per staff message and POF I have scheduled appts. Advised scheduler of appts. JMW  

## 2014-03-03 NOTE — Telephone Encounter (Signed)
Left message to confirm 03/15 appointment. Patient uses mychart.

## 2014-03-05 ENCOUNTER — Encounter: Payer: Self-pay | Admitting: Dietician

## 2014-03-05 ENCOUNTER — Encounter: Payer: BLUE CROSS/BLUE SHIELD | Attending: Unknown Physician Specialty | Admitting: Dietician

## 2014-03-05 DIAGNOSIS — Z713 Dietary counseling and surveillance: Secondary | ICD-10-CM | POA: Diagnosis not present

## 2014-03-05 DIAGNOSIS — E099 Drug or chemical induced diabetes mellitus without complications: Secondary | ICD-10-CM | POA: Diagnosis not present

## 2014-03-05 NOTE — Progress Notes (Signed)
Diabetes Self-Management Education  Visit Type:  initial  Appt. Start Time: 0930 Appt. End Time: 1030  03/05/2014  Ms. Kyliee Ortego, identified by name and date of birth, is a 54 y.o. female with a diagnosis of Diabetes: Type 2 (likely side effect of chemo drugs).  Other people present during visit:  Patient   ASSESSMENT  There were no vitals taken for this visit. There is no weight on file to calculate BMI.  Initial Visit Information:  Are you currently following a meal plan?: No   Are you taking your medications as prescribed?: Yes Are you checking your feet?: No        Psychosocial:     Patient Belief/Attitude about Diabetes: Motivated to manage diabetes Self-care barriers: None Other persons present: Patient Patient Concerns: Nutrition/Meal planning, Weight Control, Monitoring Special Needs: None Preferred Learning Style: No preference indicated Learning Readiness: Change in progress (she has cut back on sodas and is eating more protein at breakfast)  Complications:   Last HgB A1C per patient/outside source: 11.2% How often do you check your blood sugar?: 1-2 times/day (checks fasting glucose every morning as prescribed) Fasting Blood glucose range (mg/dL): 130-179, >200 (mornings after steroid shot 200-300s) Number of hypoglycemic episodes per month: 0 Number of hyperglycemic episodes per week: 1 (always the day after steroid injection) Can you tell when your blood sugar is high?: Yes What do you do if your blood sugar is high?: very thirsty, tired Have you had a dilated eye exam in the past 12 months?: Yes Have you had a dental exam in the past 12 months?: Yes  Diet Intake:  Breakfast: 2 scrambled eggs, [place] sausage OR oatmeal OR protein bars Snack (morning): yogurt Lunch: grilled chicken, veggies, potato or rice OR salad Snack (afternoon): pretzels, fruit (orange or apple), nuts, occasionally Coke Dinner: 2 soft pretzels and cheese OR salad, fast-food,  sandwiches, pasta Snack (evening): popcorn or pretzels with cheese, sometimes apple or orange Beverage(s): unsweet ice tea, decaf coffee black, water, occasional coke (normally 1 per day)  Exercise:  Exercise: Light (walking / raking leaves) Light Exercise amount of time (min / week): 60  Individualized Plan for Diabetes Self-Management Training:   Learning Objective:  Patient will have a greater understanding of diabetes self-management.  Patient education plan per assessed needs and concerns is to attend individual sessions for     Education Topics Reviewed with Patient Today:  Definition of diabetes, type 1 and 2, and the diagnosis of diabetes, Factors that contribute to the development of diabetes Role of diet in the treatment of diabetes and the relationship between the three main macronutrients and blood glucose level, Food label reading, portion sizes and measuring food., Carbohydrate counting, Reviewed blood glucose goals for pre and post meals and how to evaluate the patients' food intake on their blood glucose level., Information on hints to eating out and maintain blood glucose control.     Identified appropriate SMBG and/or A1C goals., Purpose and frequency of SMBG., Daily foot exams Taught treatment of hypoglycemia - the 15 rule., Covered sick day management with medication and food., Discussed and identified patients' treatment of hyperglycemia.   Role of stress on diabetes      PATIENTS GOALS/Plan (Developed by the patient):  Nutrition: Follow meal plan discussed, General guidelines for healthy choices and portions discussed Medications: take my medication as prescribed Reducing Risk: do foot checks daily, treat hypoglycemia with 15 grams of carbs if blood glucose less than 70mg /dL  Plan:   There  are no Patient Instructions on file for this visit.  Expected Outcomes:  Demonstrated interest in learning. Expect positive outcomes  Education material provided:  Living Well with Diabetes, Meal plan card, My Plate and Snack sheet  If problems or questions, patient to contact team via:  Phone and Email  Future DSME appointment: PRN

## 2014-03-05 NOTE — Patient Instructions (Signed)
Continue labs and chemotherapy as scheduled Follow up in 3 weeks 

## 2014-03-09 ENCOUNTER — Other Ambulatory Visit (HOSPITAL_BASED_OUTPATIENT_CLINIC_OR_DEPARTMENT_OTHER): Payer: BLUE CROSS/BLUE SHIELD

## 2014-03-09 ENCOUNTER — Ambulatory Visit (HOSPITAL_BASED_OUTPATIENT_CLINIC_OR_DEPARTMENT_OTHER): Payer: BLUE CROSS/BLUE SHIELD

## 2014-03-09 ENCOUNTER — Other Ambulatory Visit: Payer: BLUE CROSS/BLUE SHIELD

## 2014-03-09 ENCOUNTER — Ambulatory Visit: Payer: BLUE CROSS/BLUE SHIELD | Admitting: Physician Assistant

## 2014-03-09 DIAGNOSIS — C9000 Multiple myeloma not having achieved remission: Secondary | ICD-10-CM

## 2014-03-09 DIAGNOSIS — Z5112 Encounter for antineoplastic immunotherapy: Secondary | ICD-10-CM

## 2014-03-09 LAB — CBC WITH DIFFERENTIAL/PLATELET
BASO%: 0.7 % (ref 0.0–2.0)
Basophils Absolute: 0.1 10*3/uL (ref 0.0–0.1)
EOS ABS: 0.1 10*3/uL (ref 0.0–0.5)
EOS%: 1 % (ref 0.0–7.0)
HCT: 33.9 % — ABNORMAL LOW (ref 34.8–46.6)
HGB: 11.2 g/dL — ABNORMAL LOW (ref 11.6–15.9)
LYMPH%: 14.1 % (ref 14.0–49.7)
MCH: 29.5 pg (ref 25.1–34.0)
MCHC: 33.1 g/dL (ref 31.5–36.0)
MCV: 89.1 fL (ref 79.5–101.0)
MONO#: 0.4 10*3/uL (ref 0.1–0.9)
MONO%: 6.1 % (ref 0.0–14.0)
NEUT%: 78.1 % — ABNORMAL HIGH (ref 38.4–76.8)
NEUTROS ABS: 5.7 10*3/uL (ref 1.5–6.5)
PLATELETS: 185 10*3/uL (ref 145–400)
RBC: 3.8 10*6/uL (ref 3.70–5.45)
RDW: 14.8 % — ABNORMAL HIGH (ref 11.2–14.5)
WBC: 7.3 10*3/uL (ref 3.9–10.3)
lymph#: 1 10*3/uL (ref 0.9–3.3)

## 2014-03-09 LAB — COMPREHENSIVE METABOLIC PANEL (CC13)
ALT: 29 U/L (ref 0–55)
ANION GAP: 13 meq/L — AB (ref 3–11)
AST: 24 U/L (ref 5–34)
Albumin: 3.7 g/dL (ref 3.5–5.0)
Alkaline Phosphatase: 74 U/L (ref 40–150)
BUN: 9.5 mg/dL (ref 7.0–26.0)
CALCIUM: 9.5 mg/dL (ref 8.4–10.4)
CHLORIDE: 102 meq/L (ref 98–109)
CO2: 23 meq/L (ref 22–29)
CREATININE: 0.7 mg/dL (ref 0.6–1.1)
EGFR: >90 mL/min/{1.73_m2} (ref >90)
GLUCOSE: 118 mg/dL (ref 70–140)
Potassium: 4.1 mEq/L (ref 3.5–5.1)
Sodium: 139 mEq/L (ref 136–145)
Total Bilirubin: 0.63 mg/dL (ref 0.20–1.20)
Total Protein: 6.5 g/dL (ref 6.4–8.3)

## 2014-03-09 MED ORDER — BORTEZOMIB CHEMO SQ INJECTION 3.5 MG (2.5MG/ML)
1.3000 mg/m2 | Freq: Once | INTRAMUSCULAR | Status: AC
  Administered 2014-03-09: 3.25 mg via SUBCUTANEOUS
  Filled 2014-03-09: qty 3.25

## 2014-03-09 MED ORDER — ONDANSETRON HCL 8 MG PO TABS
ORAL_TABLET | ORAL | Status: AC
  Filled 2014-03-09: qty 1

## 2014-03-09 MED ORDER — ONDANSETRON HCL 8 MG PO TABS
8.0000 mg | ORAL_TABLET | Freq: Once | ORAL | Status: AC
  Administered 2014-03-09: 8 mg via ORAL

## 2014-03-09 NOTE — Patient Instructions (Signed)
[facility] Discharge Instructions for Patients Receiving Chemotherapy  Today you received the following chemotherapy agents: Velcade.  To help prevent nausea and vomiting after your treatment, we encourage you to take your nausea medication as prescribed.   If you develop nausea and vomiting that is not controlled by your nausea medication, call the clinic.   BELOW ARE SYMPTOMS THAT SHOULD BE REPORTED IMMEDIATELY:  *FEVER GREATER THAN 100.5 F  *CHILLS WITH OR WITHOUT FEVER  NAUSEA AND VOMITING THAT IS NOT CONTROLLED WITH YOUR NAUSEA MEDICATION  *UNUSUAL SHORTNESS OF BREATH  *UNUSUAL BRUISING OR BLEEDING  TENDERNESS IN MOUTH AND THROAT WITH OR WITHOUT PRESENCE OF ULCERS  *URINARY PROBLEMS  *BOWEL PROBLEMS  UNUSUAL RASH Items with * indicate a potential emergency and should be followed up as soon as possible.  Feel free to call the clinic you have any questions or concerns. The clinic phone number is [phone].    

## 2014-03-16 ENCOUNTER — Ambulatory Visit (HOSPITAL_BASED_OUTPATIENT_CLINIC_OR_DEPARTMENT_OTHER): Payer: BLUE CROSS/BLUE SHIELD

## 2014-03-16 ENCOUNTER — Other Ambulatory Visit: Payer: BLUE CROSS/BLUE SHIELD

## 2014-03-16 ENCOUNTER — Other Ambulatory Visit (HOSPITAL_BASED_OUTPATIENT_CLINIC_OR_DEPARTMENT_OTHER): Payer: BLUE CROSS/BLUE SHIELD

## 2014-03-16 DIAGNOSIS — C9000 Multiple myeloma not having achieved remission: Secondary | ICD-10-CM

## 2014-03-16 DIAGNOSIS — Z5112 Encounter for antineoplastic immunotherapy: Secondary | ICD-10-CM

## 2014-03-16 LAB — COMPREHENSIVE METABOLIC PANEL (CC13)
ALK PHOS: 74 U/L (ref 40–150)
ALT: 27 U/L (ref 0–55)
AST: 23 U/L (ref 5–34)
Albumin: 3.6 g/dL (ref 3.5–5.0)
Anion Gap: 12 mEq/L — ABNORMAL HIGH (ref 3–11)
BILIRUBIN TOTAL: 0.58 mg/dL (ref 0.20–1.20)
BUN: 9 mg/dL (ref 7.0–26.0)
CO2: 23 mEq/L (ref 22–29)
Calcium: 9.2 mg/dL (ref 8.4–10.4)
Chloride: 103 mEq/L (ref 98–109)
Creatinine: 0.7 mg/dL (ref 0.6–1.1)
GLUCOSE: 126 mg/dL (ref 70–140)
POTASSIUM: 4 meq/L (ref 3.5–5.1)
Sodium: 138 mEq/L (ref 136–145)
Total Protein: 6.4 g/dL (ref 6.4–8.3)

## 2014-03-16 LAB — CBC WITH DIFFERENTIAL/PLATELET
BASO%: 0.1 % (ref 0.0–2.0)
Basophils Absolute: 0 10e3/uL (ref 0.0–0.1)
EOS%: 0.9 % (ref 0.0–7.0)
Eosinophils Absolute: 0.1 10e3/uL (ref 0.0–0.5)
HCT: 34.3 % — ABNORMAL LOW (ref 34.8–46.6)
HGB: 11.3 g/dL — ABNORMAL LOW (ref 11.6–15.9)
LYMPH%: 15 % (ref 14.0–49.7)
MCH: 29.7 pg (ref 25.1–34.0)
MCHC: 32.9 g/dL (ref 31.5–36.0)
MCV: 90.3 fL (ref 79.5–101.0)
MONO#: 0.5 10e3/uL (ref 0.1–0.9)
MONO%: 7 % (ref 0.0–14.0)
NEUT#: 5.7 10e3/uL (ref 1.5–6.5)
NEUT%: 77 % — ABNORMAL HIGH (ref 38.4–76.8)
Platelets: 162 10e3/uL (ref 145–400)
RBC: 3.8 10e6/uL (ref 3.70–5.45)
RDW: 14.3 % (ref 11.2–14.5)
WBC: 7.5 10e3/uL (ref 3.9–10.3)
lymph#: 1.1 10e3/uL (ref 0.9–3.3)

## 2014-03-16 MED ORDER — BORTEZOMIB CHEMO SQ INJECTION 3.5 MG (2.5MG/ML)
1.3000 mg/m2 | Freq: Once | INTRAMUSCULAR | Status: AC
  Administered 2014-03-16: 3.25 mg via SUBCUTANEOUS
  Filled 2014-03-16: qty 3.25

## 2014-03-16 MED ORDER — ONDANSETRON HCL 8 MG PO TABS
ORAL_TABLET | ORAL | Status: AC
  Filled 2014-03-16: qty 1

## 2014-03-16 MED ORDER — ONDANSETRON HCL 8 MG PO TABS
8.0000 mg | ORAL_TABLET | Freq: Once | ORAL | Status: AC
  Administered 2014-03-16: 8 mg via ORAL

## 2014-03-16 NOTE — Patient Instructions (Signed)
[facility] Discharge Instructions for Patients Receiving Chemotherapy  Today you received the following chemotherapy agents: Velcade.  To help prevent nausea and vomiting after your treatment, we encourage you to take your nausea medication as prescribed.   If you develop nausea and vomiting that is not controlled by your nausea medication, call the clinic.   BELOW ARE SYMPTOMS THAT SHOULD BE REPORTED IMMEDIATELY:  *FEVER GREATER THAN 100.5 F  *CHILLS WITH OR WITHOUT FEVER  NAUSEA AND VOMITING THAT IS NOT CONTROLLED WITH YOUR NAUSEA MEDICATION  *UNUSUAL SHORTNESS OF BREATH  *UNUSUAL BRUISING OR BLEEDING  TENDERNESS IN MOUTH AND THROAT WITH OR WITHOUT PRESENCE OF ULCERS  *URINARY PROBLEMS  *BOWEL PROBLEMS  UNUSUAL RASH Items with * indicate a potential emergency and should be followed up as soon as possible.  Feel free to call the clinic you have any questions or concerns. The clinic phone number is [phone].    

## 2014-03-23 ENCOUNTER — Ambulatory Visit: Payer: BLUE CROSS/BLUE SHIELD | Admitting: Internal Medicine

## 2014-03-23 ENCOUNTER — Other Ambulatory Visit: Payer: BLUE CROSS/BLUE SHIELD

## 2014-03-23 ENCOUNTER — Telehealth: Payer: Self-pay | Admitting: Internal Medicine

## 2014-03-23 ENCOUNTER — Other Ambulatory Visit (HOSPITAL_BASED_OUTPATIENT_CLINIC_OR_DEPARTMENT_OTHER): Payer: BLUE CROSS/BLUE SHIELD

## 2014-03-23 ENCOUNTER — Encounter: Payer: Self-pay | Admitting: Internal Medicine

## 2014-03-23 ENCOUNTER — Ambulatory Visit (HOSPITAL_BASED_OUTPATIENT_CLINIC_OR_DEPARTMENT_OTHER): Payer: BLUE CROSS/BLUE SHIELD

## 2014-03-23 ENCOUNTER — Telehealth: Payer: Self-pay | Admitting: *Deleted

## 2014-03-23 ENCOUNTER — Ambulatory Visit (HOSPITAL_BASED_OUTPATIENT_CLINIC_OR_DEPARTMENT_OTHER): Payer: BLUE CROSS/BLUE SHIELD | Admitting: Internal Medicine

## 2014-03-23 VITALS — BP 160/67 | HR 70 | Temp 98.0°F | Resp 20 | Ht 67.0 in | Wt 257.9 lb

## 2014-03-23 DIAGNOSIS — Z5112 Encounter for antineoplastic immunotherapy: Secondary | ICD-10-CM

## 2014-03-23 DIAGNOSIS — C9000 Multiple myeloma not having achieved remission: Secondary | ICD-10-CM

## 2014-03-23 LAB — COMPREHENSIVE METABOLIC PANEL (CC13)
ALT: 31 U/L (ref 0–55)
AST: 22 U/L (ref 5–34)
Albumin: 3.6 g/dL (ref 3.5–5.0)
Alkaline Phosphatase: 65 U/L (ref 40–150)
Anion Gap: 12 mEq/L — ABNORMAL HIGH (ref 3–11)
BILIRUBIN TOTAL: 0.51 mg/dL (ref 0.20–1.20)
BUN: 10.4 mg/dL (ref 7.0–26.0)
CALCIUM: 9.2 mg/dL (ref 8.4–10.4)
CHLORIDE: 104 meq/L (ref 98–109)
CO2: 23 mEq/L (ref 22–29)
CREATININE: 0.7 mg/dL (ref 0.6–1.1)
Glucose: 128 mg/dl (ref 70–140)
POTASSIUM: 3.9 meq/L (ref 3.5–5.1)
Sodium: 139 mEq/L (ref 136–145)
Total Protein: 6.3 g/dL — ABNORMAL LOW (ref 6.4–8.3)

## 2014-03-23 LAB — CBC WITH DIFFERENTIAL/PLATELET
BASO%: 0.1 % (ref 0.0–2.0)
Basophils Absolute: 0 10*3/uL (ref 0.0–0.1)
EOS%: 1 % (ref 0.0–7.0)
Eosinophils Absolute: 0.1 10*3/uL (ref 0.0–0.5)
HCT: 35.5 % (ref 34.8–46.6)
HGB: 11.5 g/dL — ABNORMAL LOW (ref 11.6–15.9)
LYMPH#: 0.9 10*3/uL (ref 0.9–3.3)
LYMPH%: 12.8 % — ABNORMAL LOW (ref 14.0–49.7)
MCH: 29.5 pg (ref 25.1–34.0)
MCHC: 32.4 g/dL (ref 31.5–36.0)
MCV: 91 fL (ref 79.5–101.0)
MONO#: 0.4 10*3/uL (ref 0.1–0.9)
MONO%: 5.3 % (ref 0.0–14.0)
NEUT#: 5.8 10*3/uL (ref 1.5–6.5)
NEUT%: 80.8 % — ABNORMAL HIGH (ref 38.4–76.8)
NRBC: 0 % (ref 0–0)
Platelets: 133 10*3/uL — ABNORMAL LOW (ref 145–400)
RBC: 3.9 10*6/uL (ref 3.70–5.45)
RDW: 14.1 % (ref 11.2–14.5)
WBC: 7.1 10*3/uL (ref 3.9–10.3)

## 2014-03-23 MED ORDER — ONDANSETRON HCL 8 MG PO TABS
ORAL_TABLET | ORAL | Status: AC
  Filled 2014-03-23: qty 1

## 2014-03-23 MED ORDER — BORTEZOMIB CHEMO SQ INJECTION 3.5 MG (2.5MG/ML)
1.3000 mg/m2 | Freq: Once | INTRAMUSCULAR | Status: AC
  Administered 2014-03-23: 3.25 mg via SUBCUTANEOUS
  Filled 2014-03-23: qty 3.25

## 2014-03-23 MED ORDER — ONDANSETRON HCL 8 MG PO TABS
8.0000 mg | ORAL_TABLET | Freq: Once | ORAL | Status: AC
Start: 2014-03-23 — End: 2014-03-23
  Administered 2014-03-23: 8 mg via ORAL

## 2014-03-23 NOTE — Progress Notes (Signed)
[place] [phone]   [phone]  OFFICE PROGRESS NOTE  [name], MD [place] [address]  DIAGNOSIS: MGUS diagnosed in September 2010, with additional symptoms suggestive of POEMS syndrome.   CURRENT THERAPY: Velcade 1.3 MG/M2 subcutaneously with Decadron 40 mg by mouth on a weekly basis. First cycle 11/24/2013. She status post 16 weekly doses of treatment.  INTERVAL HISTORY: Brenda Conner 54 y.o. female returns to the clinic today for followup visit. The patient is feeling fine today with no specific complaints. She is tolerating her current treatment with subcutaneous Velcade and Decadron fairly well except for 2 days of fatigue after the treatment probably secondary to withdrawal from the Decadron. She denied having any significant weight loss or night sweats. The patient denied having any fever or chills. She has no chest pain, shortness of breath, cough or hemoptysis. She denied having any aching pain.   MEDICAL HISTORY: Past Medical History  Diagnosis Date  . PONV (postoperative nausea and vomiting)   . Hypertension   . Anxiety   . Depression   . Asthma   . Sleep apnea     CPAP at bedtime  . Cancer     waldenstroms/ macroglobinulemia  . Hypercholesteremia   . Hypothyroidism   . Depression   . Macroglobulinemia     ? POEMS syndrome  . Acid reflux   . Multiple myeloma   . Diabetes mellitus without complication   . Obesity   . Sleep apnea     ALLERGIES:  is allergic to codeine; hydrocodone; lortab; onion; shellfish allergy; and amoxicillin.  MEDICATIONS:  Current Outpatient Prescriptions  Medication Sig Dispense Refill  . acyclovir (ZOVIRAX) 400 MG tablet Take 1 tablet (400 mg total) by mouth 2 (two) times daily. 60 tablet 3  . ALPRAZolam (XANAX) 0.5 MG tablet Take 0.5 mg by mouth at bedtime as needed for anxiety.     [name] aspirin 81 MG tablet Take 81 mg by mouth daily.    . Azelastine HCl 0.15 %  SOLN as needed.    . Blood Glucose Monitoring Suppl (ONE TOUCH ULTRA MINI) W/DEVICE KIT See admin instructions.  0  . calcium-vitamin D 250-100 MG-UNIT per tablet Take 1 tablet by mouth 2 (two) times daily.    . cetirizine (ZYRTEC) 10 MG tablet Take 10 mg by mouth at bedtime.     [name] dexamethasone (DECADRON) 4 MG tablet 10 tablet by mouth every week. Start with the first dose of chemotherapy. 40 tablet 3  . fexofenadine (ALLEGRA) 180 MG tablet Take 180 mg by mouth every morning.    . Fluticasone-Salmeterol (ADVAIR) 100-50 MCG/DOSE AEPB Inhale 2 puffs into the lungs every 12 (twelve) hours.    [name] ibuprofen (ADVIL,MOTRIN) 100 MG tablet Take 100 mg by mouth every 6 (six) hours as needed.    [name] levothyroxine (SYNTHROID, LEVOTHROID) 125 MCG tablet Take 125 mcg by mouth daily before breakfast.     . metFORMIN (GLUCOPHAGE) 500 MG tablet Take 1,000 mg by mouth 2 (two) times daily.  3  . Multiple Vitamins-Minerals (MULTIVITAMIN PO) Take by mouth.    [name] omeprazole (PRILOSEC) 40 MG capsule Take 40 mg by mouth 2 (two) times daily.     . ondansetron (ZOFRAN-ODT) 8 MG disintegrating tablet Take 8 mg by mouth every 8 (eight) hours as needed.  2  . ONE TOUCH ULTRA TEST test strip See admin instructions.  11  . pravastatin (PRAVACHOL) 40 MG tablet Take  40 mg by mouth every evening.     . prochlorperazine (COMPAZINE) 10 MG tablet Take 1 tablet (10 mg total) by mouth every 6 (six) hours as needed for nausea or vomiting. 30 tablet 0  . sertraline (ZOLOFT) 50 MG tablet 3 tablets (143m) po daily 90 tablet 12  . terconazole (TERAZOL 7) 0.4 % vaginal cream   0  . UNABLE TO FIND Med Name: Allergy shots    . verapamil (CALAN-SR) 240 MG CR tablet Take 240 mg by mouth 2 (two) times daily.    .Marland KitchenVITAMIN D, CHOLECALCIFEROL, PO Take 4,000 Int'l Units by mouth daily.     No current facility-administered medications for this visit.    SURGICAL HISTORY:  Past Surgical History  Procedure Laterality Date  . Cholecystectomy    .  Back surgery  03/2004    herniation, L4-L5  . Bil laprascopic knee surgery    . Uterine fibroid embolization    . Bone marrow biopsy      2011  . Laparoscopic cholecystectomy  1997  . Foot surgery  1998  . Nasal sinus surgery  2004  . Ablation  09/2007    HTA and polyp resection  . Knee arthroscopy w/ meniscal repair  10/10 ; 3/11  . Bone marrow biopsy  4/14    REVIEW OF SYSTEMS:  A comprehensive review of systems was negative.   PHYSICAL EXAMINATION: General appearance: alert, cooperative and no distress Head: Normocephalic, without obvious abnormality, atraumatic Neck: no adenopathy Lymph nodes: Cervical, supraclavicular, and axillary nodes normal. Resp: clear to auscultation bilaterally Cardio: regular rate and rhythm, S1, S2 normal, no murmur, click, rub or gallop GI: soft, non-tender; bowel sounds normal; no masses,  no organomegaly Extremities: extremities normal, atraumatic, no cyanosis or edema  ECOG PERFORMANCE STATUS: 0 - Asymptomatic  Blood pressure 160/67, pulse 70, temperature 98 F (36.7 C), temperature source Oral, resp. rate 20, height _0  (1.702 m), weight 257 lb 14.4 oz (116.983 kg).  LABORATORY DATA: Lab Results  Component Value Date   WBC 7.1 03/23/2014   HGB 11.5* 03/23/2014   HCT 35.5 03/23/2014   MCV 91.0 03/23/2014   PLT 133* 03/23/2014      Chemistry      Component Value Date/Time   NA 139 03/23/2014 1043   NA 137 03/20/2011 1509   K 3.9 03/23/2014 1043   K 3.4* 03/20/2011 1509   CL 104 04/07/2012 1415   CL 102 03/20/2011 1509   CO2 23 03/23/2014 1043   CO2 26 03/20/2011 1509   BUN 10.4 03/23/2014 1043   BUN 7 03/20/2011 1509   CREATININE 0.7 03/23/2014 1043   CREATININE 0.63 03/20/2011 1509      Component Value Date/Time   CALCIUM 9.2 03/23/2014 1043   CALCIUM 9.2 03/20/2011 1509   ALKPHOS 65 03/23/2014 1043   ALKPHOS 74 03/20/2011 1509   AST 22 03/23/2014 1043   AST 24 03/20/2011 1509   ALT 31 03/23/2014 1043   ALT 26  03/20/2011 1509   BILITOT 0.51 03/23/2014 1043   BILITOT 0.3 03/20/2011 1509       ASSESSMENT AND PLAN: This is a very pleasant 54years old white female with a multiple myeloma initially diagnosed as monoclonal gammopathy in addition to peripheral neuropathy, and Endocrinopathy in the form of hypothyroidism and skin rash. The symptoms combined are suspicious for POEMS syndrome.  She is currently on treatment with subcutaneous tenderness Velcade and Decadron status post 10 cycles and tolerated her treatment fairly  well. The recent myeloma panel showed improvement in her disease. I discussed the lab result with the patient today and recommended for her to continue her  treatment with subcutaneous Velcade 1.3 MG/M2 weekly with Decadron 40 mg by mouth on a weekly basis.  She will come back for follow-up visit in 4 weeks for reevaluation and management of any adverse effect of her treatment after repeating myeloma panel for evaluation of her disease. She was advised to call immediately if her symptoms started getting worse as I may consider the patient for treatment with multiple myeloma regimen. The patient agreed to the current plan.  All questions were answered. The patient knows to call the clinic with any problems, questions or concerns. We can certainly see the patient much sooner if necessary.  Disclaimer: This note was dictated with voice recognition software. Similar sounding words can inadvertently be transcribed and may not be corrected upon review.

## 2014-03-23 NOTE — Patient Instructions (Signed)
[facility] Discharge Instructions for Patients Receiving Chemotherapy  Today you received the following chemotherapy agents Velcade  To help prevent nausea and vomiting after your treatment, we encourage you to take your nausea medication as directed/prescribed.   If you develop nausea and vomiting that is not controlled by your nausea medication, call the clinic.   BELOW ARE SYMPTOMS THAT SHOULD BE REPORTED IMMEDIATELY:  *FEVER GREATER THAN 100.5 F  *CHILLS WITH OR WITHOUT FEVER  NAUSEA AND VOMITING THAT IS NOT CONTROLLED WITH YOUR NAUSEA MEDICATION  *UNUSUAL SHORTNESS OF BREATH  *UNUSUAL BRUISING OR BLEEDING  TENDERNESS IN MOUTH AND THROAT WITH OR WITHOUT PRESENCE OF ULCERS  *URINARY PROBLEMS  *BOWEL PROBLEMS  UNUSUAL RASH Items with * indicate a potential emergency and should be followed up as soon as possible.  Feel free to call the clinic you have any questions or concerns. The clinic phone number is [phone].    

## 2014-03-23 NOTE — Telephone Encounter (Signed)
Pt confirmed labs/ov per 03/15 POF, gave pt AVS and calendar.,... KJ, sent msg to add chemo, msg pt with updated schedule through my chart

## 2014-03-23 NOTE — Telephone Encounter (Signed)
Per staff message and POF I have scheduled appts. Advised scheduler of appts. JMW  

## 2014-03-23 NOTE — Telephone Encounter (Signed)
Pt confirmed labs/ov per 03/15 POF, gave pt AVS and calendar.... KJ, sent msg to add chemo

## 2014-03-30 ENCOUNTER — Other Ambulatory Visit (HOSPITAL_BASED_OUTPATIENT_CLINIC_OR_DEPARTMENT_OTHER): Payer: BLUE CROSS/BLUE SHIELD

## 2014-03-30 ENCOUNTER — Ambulatory Visit (HOSPITAL_BASED_OUTPATIENT_CLINIC_OR_DEPARTMENT_OTHER): Payer: BLUE CROSS/BLUE SHIELD

## 2014-03-30 DIAGNOSIS — C9000 Multiple myeloma not having achieved remission: Secondary | ICD-10-CM

## 2014-03-30 DIAGNOSIS — Z5112 Encounter for antineoplastic immunotherapy: Secondary | ICD-10-CM

## 2014-03-30 LAB — COMPREHENSIVE METABOLIC PANEL (CC13)
ALK PHOS: 69 U/L (ref 40–150)
ALT: 38 U/L (ref 0–55)
ANION GAP: 13 meq/L — AB (ref 3–11)
AST: 34 U/L (ref 5–34)
Albumin: 3.8 g/dL (ref 3.5–5.0)
BILIRUBIN TOTAL: 0.6 mg/dL (ref 0.20–1.20)
BUN: 11.5 mg/dL (ref 7.0–26.0)
CO2: 23 meq/L (ref 22–29)
CREATININE: 0.8 mg/dL (ref 0.6–1.1)
Calcium: 9.2 mg/dL (ref 8.4–10.4)
Chloride: 103 mEq/L (ref 98–109)
Glucose: 121 mg/dl (ref 70–140)
Potassium: 4.2 mEq/L (ref 3.5–5.1)
SODIUM: 139 meq/L (ref 136–145)
TOTAL PROTEIN: 6.7 g/dL (ref 6.4–8.3)

## 2014-03-30 LAB — CBC WITH DIFFERENTIAL/PLATELET
BASO%: 0.7 % (ref 0.0–2.0)
Basophils Absolute: 0.1 10*3/uL (ref 0.0–0.1)
EOS ABS: 0.1 10*3/uL (ref 0.0–0.5)
EOS%: 1.1 % (ref 0.0–7.0)
HEMATOCRIT: 34.4 % — AB (ref 34.8–46.6)
HGB: 11.4 g/dL — ABNORMAL LOW (ref 11.6–15.9)
LYMPH%: 17.7 % (ref 14.0–49.7)
MCH: 29.3 pg (ref 25.1–34.0)
MCHC: 33.1 g/dL (ref 31.5–36.0)
MCV: 88.6 fL (ref 79.5–101.0)
MONO#: 0.5 10*3/uL (ref 0.1–0.9)
MONO%: 6.8 % (ref 0.0–14.0)
NEUT#: 5.4 10*3/uL (ref 1.5–6.5)
NEUT%: 73.7 % (ref 38.4–76.8)
Platelets: 173 10*3/uL (ref 145–400)
RBC: 3.89 10*6/uL (ref 3.70–5.45)
RDW: 14.6 % — ABNORMAL HIGH (ref 11.2–14.5)
WBC: 7.3 10*3/uL (ref 3.9–10.3)
lymph#: 1.3 10*3/uL (ref 0.9–3.3)

## 2014-03-30 MED ORDER — ONDANSETRON HCL 8 MG PO TABS
ORAL_TABLET | ORAL | Status: AC
  Filled 2014-03-30: qty 1

## 2014-03-30 MED ORDER — BORTEZOMIB CHEMO SQ INJECTION 3.5 MG (2.5MG/ML)
1.3000 mg/m2 | Freq: Once | INTRAMUSCULAR | Status: AC
  Administered 2014-03-30: 3.25 mg via SUBCUTANEOUS
  Filled 2014-03-30: qty 3.25

## 2014-03-30 MED ORDER — ONDANSETRON HCL 8 MG PO TABS
8.0000 mg | ORAL_TABLET | Freq: Once | ORAL | Status: AC
  Administered 2014-03-30: 8 mg via ORAL

## 2014-03-30 NOTE — Patient Instructions (Signed)
[facility] Discharge Instructions for Patients Receiving Chemotherapy  Today you received the following chemotherapy agents Velcade  To help prevent nausea and vomiting after your treatment, we encourage you to take your nausea medication as directed/prescribed If you develop nausea and vomiting that is not controlled by your nausea medication, call the clinic.   BELOW ARE SYMPTOMS THAT SHOULD BE REPORTED IMMEDIATELY:  *FEVER GREATER THAN 100.5 F  *CHILLS WITH OR WITHOUT FEVER  NAUSEA AND VOMITING THAT IS NOT CONTROLLED WITH YOUR NAUSEA MEDICATION  *UNUSUAL SHORTNESS OF BREATH  *UNUSUAL BRUISING OR BLEEDING  TENDERNESS IN MOUTH AND THROAT WITH OR WITHOUT PRESENCE OF ULCERS  *URINARY PROBLEMS  *BOWEL PROBLEMS  UNUSUAL RASH Items with * indicate a potential emergency and should be followed up as soon as possible.  Feel free to call the clinic you have any questions or concerns. The clinic phone number is [phone].  Please show the CHEMO ALERT CARD at check-in to the Emergency Department and triage nurse.   

## 2014-04-04 IMAGING — CT CT BIOPSY
4 series · 16 of 26 positions shown, 21 images · non-contrast
Comparison: none

CT GUIDED RIGHT ILIAC BONE MARROW ASPIRATION AND BONE MARROW CORE
BIOPSIES

Date: 04/21/12
CLINICAL HISTORY: 52 year-old female with a past medical history of
monoclonal gammopathy.  She presents for CT-guided bone marrow
biopsy.

[Series 3: add scan 5.0 b70f · axial · 0.77mm/px · z∈[-113,-108]mm · 2 of 4 slices shown (1 of 2)]
[im 2/4  soft-tissue]
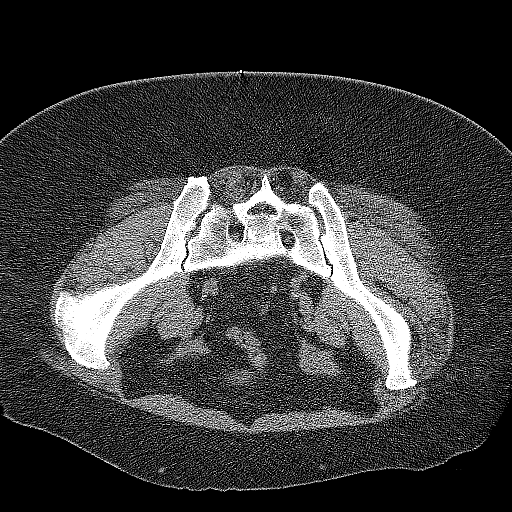
[im 3/4  soft-tissue]
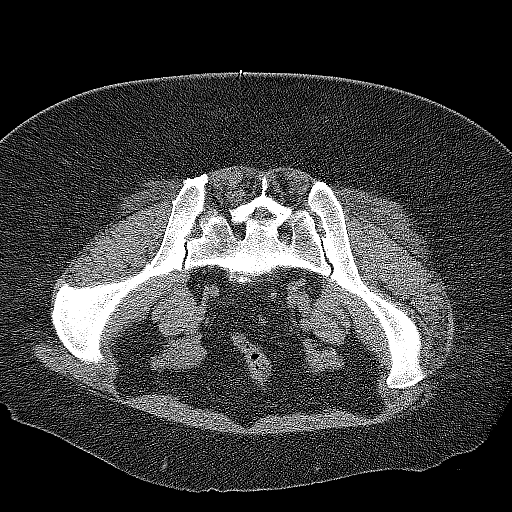

[Series 4: (hospital) 6.0 b30f · axial · 1.48mm/px · z∈[-113,-113]mm · 2 of 4 slices shown (1 of 2)]
[im 2/4  soft-tissue]
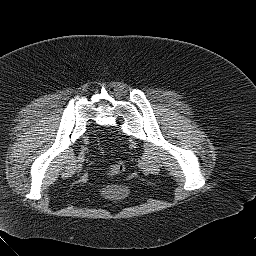
[im 3/4  soft-tissue]
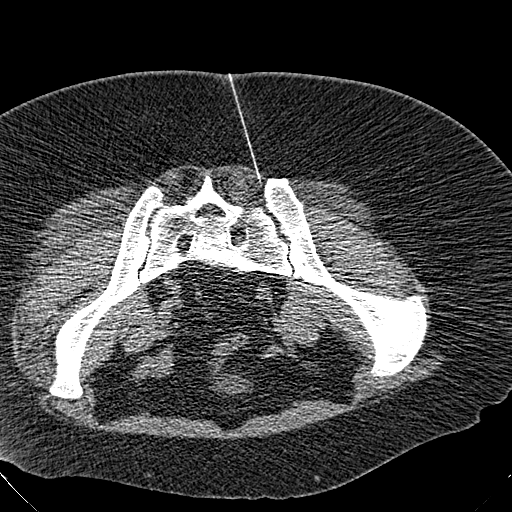

[Series 5: (hospital) 6.0 b30f · axial · 1.48mm/px · z∈[-109,-107]mm · 8 of 12 slices shown (2 of 2)]
[im 2/12  soft-tissue]
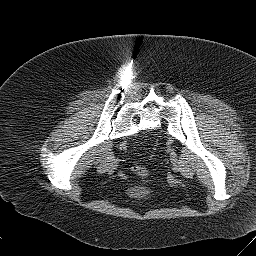
[im 3/12  soft-tissue]
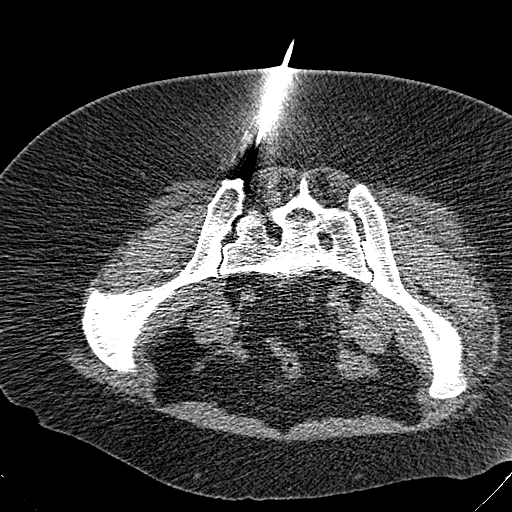
[im 5/12  soft-tissue]
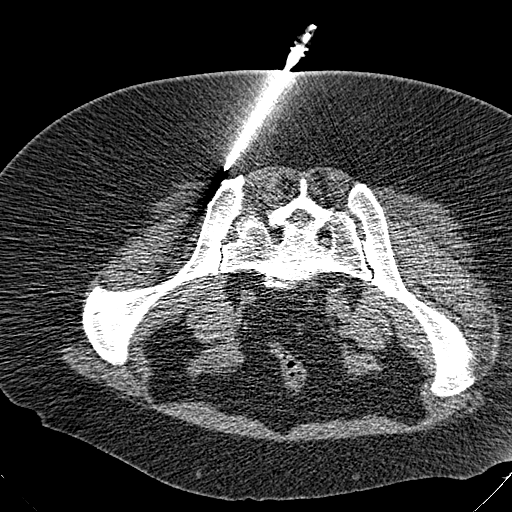
[im 6/12  soft-tissue]
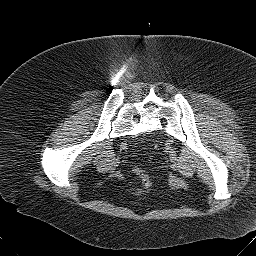
[im 7/12  soft-tissue]
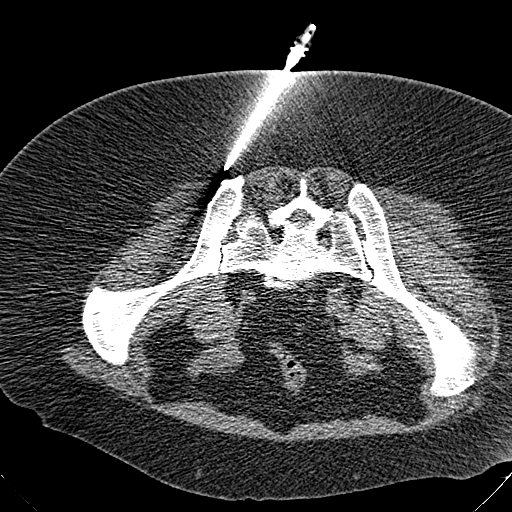
[im 8/12  soft-tissue]
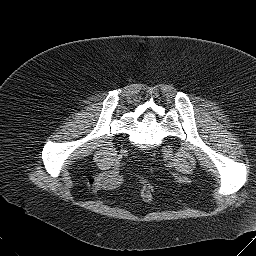
[im 10/12  soft-tissue]
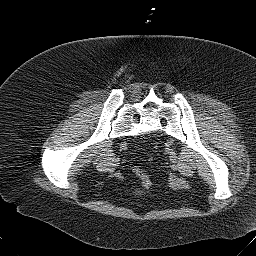
[im 11/12  soft-tissue]
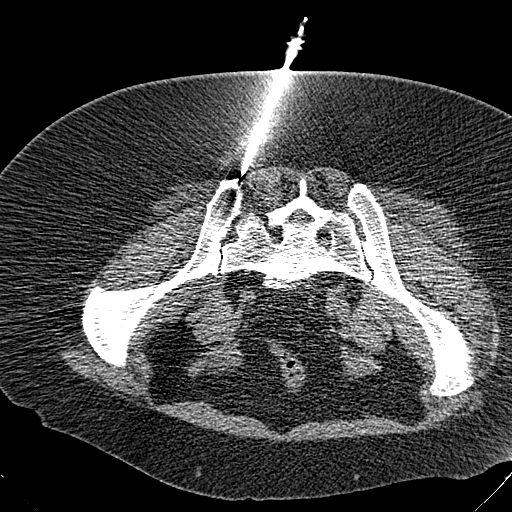

[Series 6: add scan 5.0 b70f · axial · 0.77mm/px · z∈[-123,-108]mm · 4 of 6 slices shown, 9 images (2 of 2)]
[im 2/6  soft-tissue]
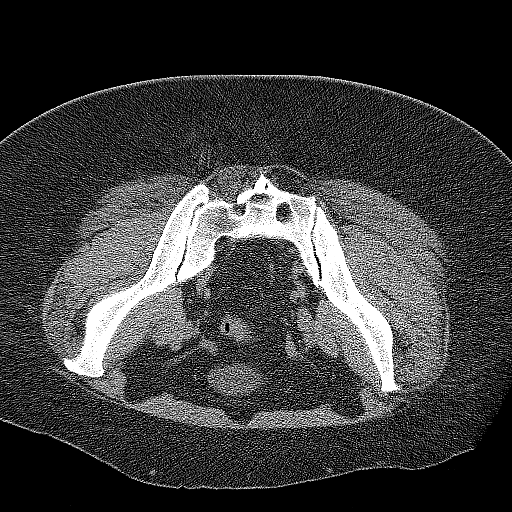
[im 2/6  lung]
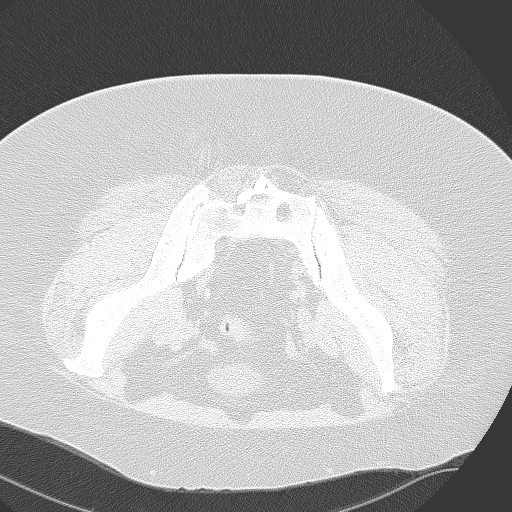
[im 2/6  bone]
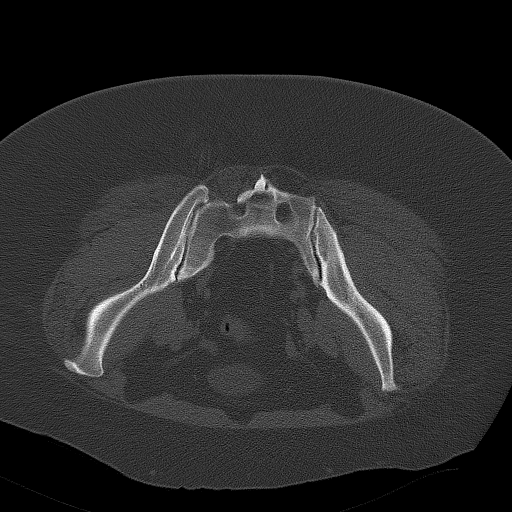
[im 3/6  soft-tissue]
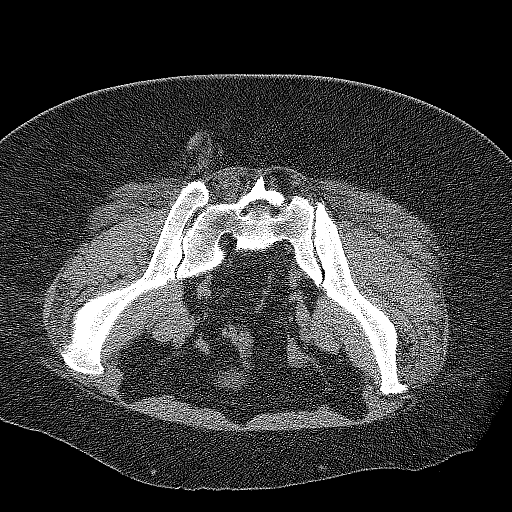
[im 3/6  lung]
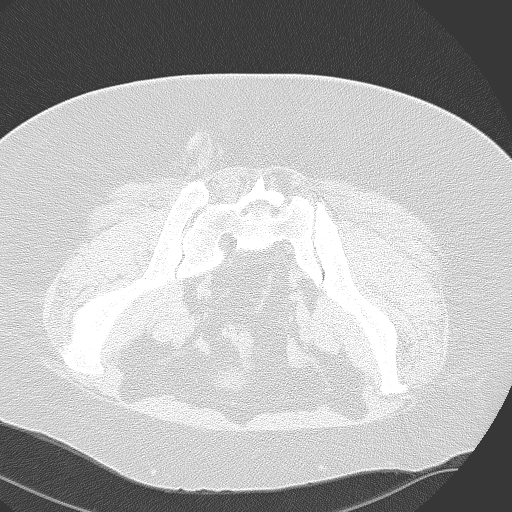
[im 4/6  soft-tissue]
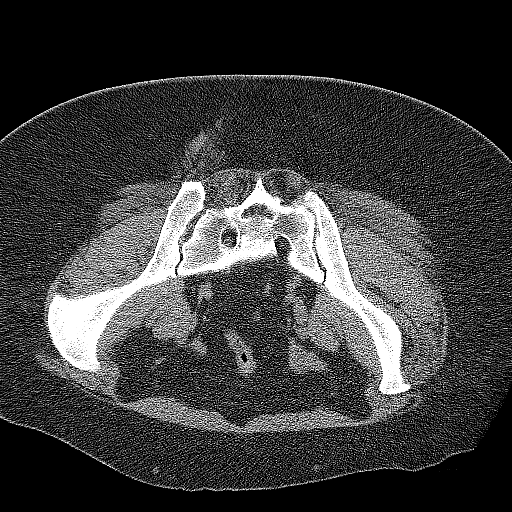
[im 4/6  lung]
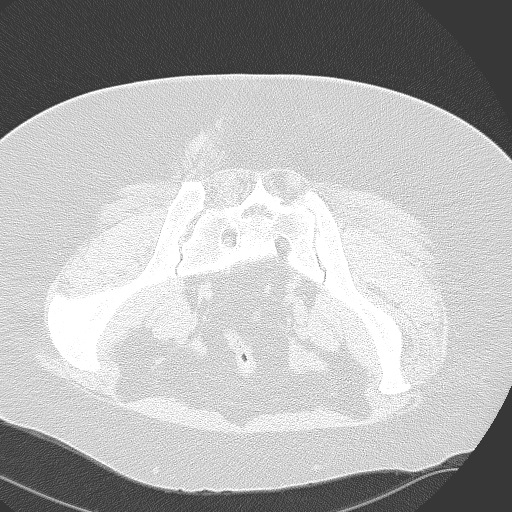
[im 5/6  soft-tissue]
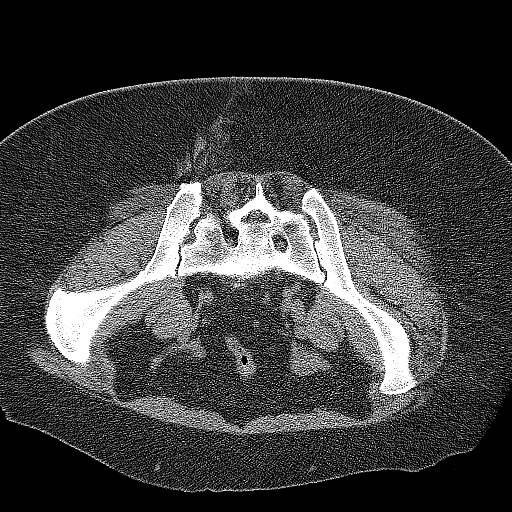
[im 5/6  lung]
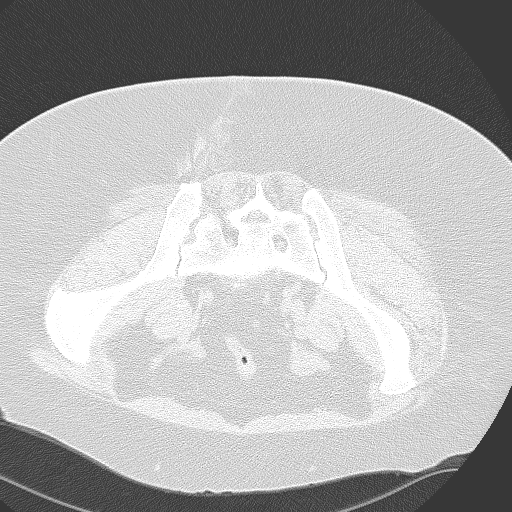

[16 of 26 positions shown; findings below may reference images not displayed]

Procedures Performed:
1. CT guided bone marrow aspiration and core biopsy

Sedation: Moderate (conscious) sedation was used.  Four mg Versed,
200 mcg Fentanyl were administered intravenously.  The patient's
vital signs were monitored continuously by radiology nursing
throughout the procedure.

Sedation Time: 20 minutes

PROCEDURE/FINDINGS:

Informed consent was obtained from the patient following
explanation of the procedure, risks, benefits and alternatives.
The patient understands, agrees and consents for the procedure.
All questions were addressed.  A time out was performed.

The patient was positioned prone and noncontrast localization CT
was performed of the pelvis to demonstrate the iliac marrow spaces.

Maximal barrier sterile technique utilized including caps, mask,
sterile gowns, sterile gloves, large sterile drape, hand hygiene,
and betadine prep.

Under sterile conditions and local anesthesia, an 11 gauge coaxial
bone biopsy needle was advanced into the right iliac marrow space.
Needle position was confirmed with CT imaging. Initially, bone
marrow aspiration was performed. Next, the 11 gauge outer cannula
was utilized to obtain a right iliac bone marrow core biopsy.
Needle was removed. Hemostasis was obtained with compression. The
patient tolerated the procedure well. Samples were prepared with
the cytotechnologist. No immediate complications.
IMPRESSION: CT guided right iliac bone marrow aspiration and core biopsy.

[REDACTED]

## 2014-04-05 ENCOUNTER — Other Ambulatory Visit: Payer: Self-pay | Admitting: Medical Oncology

## 2014-04-05 DIAGNOSIS — C9000 Multiple myeloma not having achieved remission: Secondary | ICD-10-CM

## 2014-04-06 ENCOUNTER — Other Ambulatory Visit (HOSPITAL_BASED_OUTPATIENT_CLINIC_OR_DEPARTMENT_OTHER): Payer: BLUE CROSS/BLUE SHIELD

## 2014-04-06 ENCOUNTER — Ambulatory Visit (HOSPITAL_BASED_OUTPATIENT_CLINIC_OR_DEPARTMENT_OTHER): Payer: BLUE CROSS/BLUE SHIELD

## 2014-04-06 DIAGNOSIS — Z5112 Encounter for antineoplastic immunotherapy: Secondary | ICD-10-CM | POA: Diagnosis not present

## 2014-04-06 DIAGNOSIS — C9000 Multiple myeloma not having achieved remission: Secondary | ICD-10-CM | POA: Diagnosis not present

## 2014-04-06 LAB — CBC WITH DIFFERENTIAL/PLATELET
BASO%: 0.8 % (ref 0.0–2.0)
BASOS ABS: 0.1 10*3/uL (ref 0.0–0.1)
EOS%: 1.5 % (ref 0.0–7.0)
Eosinophils Absolute: 0.1 10*3/uL (ref 0.0–0.5)
HCT: 34 % — ABNORMAL LOW (ref 34.8–46.6)
HGB: 11.3 g/dL — ABNORMAL LOW (ref 11.6–15.9)
LYMPH%: 19.7 % (ref 14.0–49.7)
MCH: 29.4 pg (ref 25.1–34.0)
MCHC: 33.1 g/dL (ref 31.5–36.0)
MCV: 88.8 fL (ref 79.5–101.0)
MONO#: 0.5 10*3/uL (ref 0.1–0.9)
MONO%: 6.6 % (ref 0.0–14.0)
NEUT#: 4.9 10*3/uL (ref 1.5–6.5)
NEUT%: 71.4 % (ref 38.4–76.8)
PLATELETS: 180 10*3/uL (ref 145–400)
RBC: 3.83 10*6/uL (ref 3.70–5.45)
RDW: 14.6 % — AB (ref 11.2–14.5)
WBC: 6.9 10*3/uL (ref 3.9–10.3)
lymph#: 1.4 10*3/uL (ref 0.9–3.3)

## 2014-04-06 LAB — COMPREHENSIVE METABOLIC PANEL (CC13)
ALK PHOS: 77 U/L (ref 40–150)
ALT: 37 U/L (ref 0–55)
AST: 23 U/L (ref 5–34)
Albumin: 3.8 g/dL (ref 3.5–5.0)
Anion Gap: 13 mEq/L — ABNORMAL HIGH (ref 3–11)
BILIRUBIN TOTAL: 0.52 mg/dL (ref 0.20–1.20)
BUN: 12.2 mg/dL (ref 7.0–26.0)
CALCIUM: 9.6 mg/dL (ref 8.4–10.4)
CO2: 23 mEq/L (ref 22–29)
Chloride: 102 mEq/L (ref 98–109)
Creatinine: 0.7 mg/dL (ref 0.6–1.1)
EGFR: >90 mL/min/{1.73_m2} (ref >90)
Glucose: 118 mg/dl (ref 70–140)
POTASSIUM: 4 meq/L (ref 3.5–5.1)
SODIUM: 139 meq/L (ref 136–145)
TOTAL PROTEIN: 6.6 g/dL (ref 6.4–8.3)

## 2014-04-06 MED ORDER — ONDANSETRON HCL 8 MG PO TABS
8.0000 mg | ORAL_TABLET | Freq: Once | ORAL | Status: AC
  Administered 2014-04-06: 8 mg via ORAL

## 2014-04-06 MED ORDER — ONDANSETRON HCL 8 MG PO TABS
ORAL_TABLET | ORAL | Status: AC
  Filled 2014-04-06: qty 1

## 2014-04-06 MED ORDER — BORTEZOMIB CHEMO SQ INJECTION 3.5 MG (2.5MG/ML)
1.3000 mg/m2 | Freq: Once | INTRAMUSCULAR | Status: AC
Start: 2014-04-06 — End: 2014-04-06
  Administered 2014-04-06: 3.25 mg via SUBCUTANEOUS
  Filled 2014-04-06: qty 3.25

## 2014-04-06 NOTE — Patient Instructions (Signed)
[facility] Discharge Instructions for Patients Receiving Chemotherapy  Today you received the following chemotherapy agents Velcade  To help prevent nausea and vomiting after your treatment, we encourage you to take your nausea medication as directed/prescribed If you develop nausea and vomiting that is not controlled by your nausea medication, call the clinic.   BELOW ARE SYMPTOMS THAT SHOULD BE REPORTED IMMEDIATELY:  *FEVER GREATER THAN 100.5 F  *CHILLS WITH OR WITHOUT FEVER  NAUSEA AND VOMITING THAT IS NOT CONTROLLED WITH YOUR NAUSEA MEDICATION  *UNUSUAL SHORTNESS OF BREATH  *UNUSUAL BRUISING OR BLEEDING  TENDERNESS IN MOUTH AND THROAT WITH OR WITHOUT PRESENCE OF ULCERS  *URINARY PROBLEMS  *BOWEL PROBLEMS  UNUSUAL RASH Items with * indicate a potential emergency and should be followed up as soon as possible.  Feel free to call the clinic you have any questions or concerns. The clinic phone number is [phone].  Please show the CHEMO ALERT CARD at check-in to the Emergency Department and triage nurse.   

## 2014-04-13 ENCOUNTER — Other Ambulatory Visit (HOSPITAL_BASED_OUTPATIENT_CLINIC_OR_DEPARTMENT_OTHER): Payer: BLUE CROSS/BLUE SHIELD

## 2014-04-13 ENCOUNTER — Ambulatory Visit (HOSPITAL_BASED_OUTPATIENT_CLINIC_OR_DEPARTMENT_OTHER): Payer: BLUE CROSS/BLUE SHIELD

## 2014-04-13 DIAGNOSIS — C9000 Multiple myeloma not having achieved remission: Secondary | ICD-10-CM | POA: Diagnosis not present

## 2014-04-13 DIAGNOSIS — Z5112 Encounter for antineoplastic immunotherapy: Secondary | ICD-10-CM

## 2014-04-13 LAB — COMPREHENSIVE METABOLIC PANEL (CC13)
ALK PHOS: 75 U/L (ref 40–150)
ALT: 33 U/L (ref 0–55)
AST: 26 U/L (ref 5–34)
Albumin: 3.8 g/dL (ref 3.5–5.0)
Anion Gap: 11 mEq/L (ref 3–11)
BUN: 12.1 mg/dL (ref 7.0–26.0)
CO2: 22 mEq/L (ref 22–29)
CREATININE: 0.7 mg/dL (ref 0.6–1.1)
Calcium: 9.3 mg/dL (ref 8.4–10.4)
Chloride: 103 mEq/L (ref 98–109)
Glucose: 124 mg/dl (ref 70–140)
POTASSIUM: 3.8 meq/L (ref 3.5–5.1)
Sodium: 137 mEq/L (ref 136–145)
Total Bilirubin: 0.5 mg/dL (ref 0.20–1.20)
Total Protein: 6.6 g/dL (ref 6.4–8.3)

## 2014-04-13 LAB — CBC WITH DIFFERENTIAL/PLATELET
BASO%: 0.2 % (ref 0.0–2.0)
Basophils Absolute: 0 10*3/uL (ref 0.0–0.1)
EOS%: 1.3 % (ref 0.0–7.0)
Eosinophils Absolute: 0.1 10*3/uL (ref 0.0–0.5)
HCT: 34.2 % — ABNORMAL LOW (ref 34.8–46.6)
HEMOGLOBIN: 11.2 g/dL — AB (ref 11.6–15.9)
LYMPH#: 1.1 10*3/uL (ref 0.9–3.3)
LYMPH%: 17.2 % (ref 14.0–49.7)
MCH: 29.6 pg (ref 25.1–34.0)
MCHC: 32.7 g/dL (ref 31.5–36.0)
MCV: 90.5 fL (ref 79.5–101.0)
MONO#: 0.4 10*3/uL (ref 0.1–0.9)
MONO%: 6.7 % (ref 0.0–14.0)
NEUT#: 4.7 10*3/uL (ref 1.5–6.5)
NEUT%: 74.6 % (ref 38.4–76.8)
Platelets: 162 10*3/uL (ref 145–400)
RBC: 3.78 10*6/uL (ref 3.70–5.45)
RDW: 13.7 % (ref 11.2–14.5)
WBC: 6.2 10*3/uL (ref 3.9–10.3)

## 2014-04-13 LAB — LACTATE DEHYDROGENASE (CC13): LDH: 152 U/L (ref 125–245)

## 2014-04-13 MED ORDER — BORTEZOMIB CHEMO SQ INJECTION 3.5 MG (2.5MG/ML)
1.3000 mg/m2 | Freq: Once | INTRAMUSCULAR | Status: AC
  Administered 2014-04-13: 3.25 mg via SUBCUTANEOUS
  Filled 2014-04-13: qty 3.25

## 2014-04-13 MED ORDER — ONDANSETRON HCL 8 MG PO TABS
ORAL_TABLET | ORAL | Status: AC
  Filled 2014-04-13: qty 1

## 2014-04-13 MED ORDER — ONDANSETRON HCL 8 MG PO TABS
8.0000 mg | ORAL_TABLET | Freq: Once | ORAL | Status: AC
  Administered 2014-04-13: 8 mg via ORAL

## 2014-04-13 NOTE — Patient Instructions (Signed)
[facility] Discharge Instructions for Patients Receiving Chemotherapy  Today you received the following chemotherapy agents:  Velcade  To help prevent nausea and vomiting after your treatment, we encourage you to take your nausea medication as prescribed.   If you develop nausea and vomiting that is not controlled by your nausea medication, call the clinic.   BELOW ARE SYMPTOMS THAT SHOULD BE REPORTED IMMEDIATELY:  *FEVER GREATER THAN 100.5 F  *CHILLS WITH OR WITHOUT FEVER  NAUSEA AND VOMITING THAT IS NOT CONTROLLED WITH YOUR NAUSEA MEDICATION  *UNUSUAL SHORTNESS OF BREATH  *UNUSUAL BRUISING OR BLEEDING  TENDERNESS IN MOUTH AND THROAT WITH OR WITHOUT PRESENCE OF ULCERS  *URINARY PROBLEMS  *BOWEL PROBLEMS  UNUSUAL RASH Items with * indicate a potential emergency and should be followed up as soon as possible.  Feel free to call the clinic you have any questions or concerns. The clinic phone number is [phone].  Please show the CHEMO ALERT CARD at check-in to the Emergency Department and triage nurse.   

## 2014-04-15 LAB — KAPPA/LAMBDA LIGHT CHAINS
KAPPA FREE LGHT CHN: 0.4 mg/dL (ref 0.33–1.94)
Kappa:Lambda Ratio: 0.01 — ABNORMAL LOW (ref 0.26–1.65)
Lambda Free Lght Chn: 53.2 mg/dL — ABNORMAL HIGH (ref 0.57–2.63)

## 2014-04-15 LAB — IGG, IGA, IGM
IGA: 68 mg/dL — AB (ref 69–380)
IGG (IMMUNOGLOBIN G), SERUM: 332 mg/dL — AB (ref 690–1700)
IgM, Serum: 547 mg/dL — ABNORMAL HIGH (ref 52–322)

## 2014-04-15 LAB — BETA 2 MICROGLOBULIN, SERUM: Beta-2 Microglobulin: 2 mg/L (ref <=2.51)

## 2014-04-20 ENCOUNTER — Telehealth: Payer: Self-pay | Admitting: Internal Medicine

## 2014-04-20 ENCOUNTER — Encounter: Payer: Self-pay | Admitting: Internal Medicine

## 2014-04-20 ENCOUNTER — Telehealth: Payer: Self-pay | Admitting: *Deleted

## 2014-04-20 ENCOUNTER — Ambulatory Visit (HOSPITAL_BASED_OUTPATIENT_CLINIC_OR_DEPARTMENT_OTHER): Payer: BLUE CROSS/BLUE SHIELD

## 2014-04-20 ENCOUNTER — Other Ambulatory Visit: Payer: Self-pay | Admitting: Medical Oncology

## 2014-04-20 ENCOUNTER — Other Ambulatory Visit (HOSPITAL_BASED_OUTPATIENT_CLINIC_OR_DEPARTMENT_OTHER): Payer: BLUE CROSS/BLUE SHIELD

## 2014-04-20 ENCOUNTER — Ambulatory Visit (HOSPITAL_BASED_OUTPATIENT_CLINIC_OR_DEPARTMENT_OTHER): Payer: BLUE CROSS/BLUE SHIELD | Admitting: Internal Medicine

## 2014-04-20 VITALS — BP 141/64 | HR 67 | Temp 97.8°F | Resp 19 | Ht 67.0 in | Wt 259.7 lb

## 2014-04-20 DIAGNOSIS — C9000 Multiple myeloma not having achieved remission: Secondary | ICD-10-CM | POA: Diagnosis not present

## 2014-04-20 DIAGNOSIS — Z5112 Encounter for antineoplastic immunotherapy: Secondary | ICD-10-CM | POA: Diagnosis not present

## 2014-04-20 LAB — CBC WITH DIFFERENTIAL/PLATELET
BASO%: 0.6 % (ref 0.0–2.0)
BASOS ABS: 0 10*3/uL (ref 0.0–0.1)
EOS%: 1.4 % (ref 0.0–7.0)
Eosinophils Absolute: 0.1 10*3/uL (ref 0.0–0.5)
HCT: 34.1 % — ABNORMAL LOW (ref 34.8–46.6)
HGB: 11.1 g/dL — ABNORMAL LOW (ref 11.6–15.9)
LYMPH#: 1.4 10*3/uL (ref 0.9–3.3)
LYMPH%: 17 % (ref 14.0–49.7)
MCH: 29.2 pg (ref 25.1–34.0)
MCHC: 32.6 g/dL (ref 31.5–36.0)
MCV: 89.4 fL (ref 79.5–101.0)
MONO#: 0.5 10*3/uL (ref 0.1–0.9)
MONO%: 5.6 % (ref 0.0–14.0)
NEUT#: 6.2 10*3/uL (ref 1.5–6.5)
NEUT%: 75.4 % (ref 38.4–76.8)
Platelets: 187 10*3/uL (ref 145–400)
RBC: 3.82 10*6/uL (ref 3.70–5.45)
RDW: 14.6 % — AB (ref 11.2–14.5)
WBC: 8.2 10*3/uL (ref 3.9–10.3)

## 2014-04-20 LAB — COMPREHENSIVE METABOLIC PANEL (CC13)
ALK PHOS: 73 U/L (ref 40–150)
ALT: 32 U/L (ref 0–55)
AST: 23 U/L (ref 5–34)
Albumin: 3.7 g/dL (ref 3.5–5.0)
Anion Gap: 10 mEq/L (ref 3–11)
BUN: 13.5 mg/dL (ref 7.0–26.0)
CALCIUM: 9.2 mg/dL (ref 8.4–10.4)
CO2: 22 mEq/L (ref 22–29)
Chloride: 104 mEq/L (ref 98–109)
Creatinine: 0.7 mg/dL (ref 0.6–1.1)
EGFR: >90 mL/min/{1.73_m2} (ref >90)
GLUCOSE: 122 mg/dL (ref 70–140)
POTASSIUM: 4 meq/L (ref 3.5–5.1)
SODIUM: 136 meq/L (ref 136–145)
Total Bilirubin: 0.46 mg/dL (ref 0.20–1.20)
Total Protein: 6.5 g/dL (ref 6.4–8.3)

## 2014-04-20 MED ORDER — ONDANSETRON HCL 8 MG PO TABS
ORAL_TABLET | ORAL | Status: AC
  Filled 2014-04-20: qty 1

## 2014-04-20 MED ORDER — BORTEZOMIB CHEMO SQ INJECTION 3.5 MG (2.5MG/ML)
1.3000 mg/m2 | Freq: Once | INTRAMUSCULAR | Status: AC
  Administered 2014-04-20: 3.25 mg via SUBCUTANEOUS
  Filled 2014-04-20: qty 3.25

## 2014-04-20 MED ORDER — ONDANSETRON HCL 8 MG PO TABS
8.0000 mg | ORAL_TABLET | Freq: Once | ORAL | Status: AC
  Administered 2014-04-20: 8 mg via ORAL

## 2014-04-20 NOTE — Progress Notes (Signed)
Per Dr. Julien Nordmann, use lab results from last week. Will proceed with treatment.

## 2014-04-20 NOTE — Telephone Encounter (Signed)
Pt confirmed labs/ov per 04/12 POF, gave pt AVS and Calendar.... KJ, sent msg to add chemo

## 2014-04-20 NOTE — Telephone Encounter (Signed)
Per staff message and POF I have scheduled appts. Advised scheduler of appts. JMW  

## 2014-04-20 NOTE — Patient Instructions (Signed)
[facility] Discharge Instructions for Patients Receiving Chemotherapy  Today you received the following chemotherapy agents:  Velcade  To help prevent nausea and vomiting after your treatment, we encourage you to take your nausea medication as prescribed.   If you develop nausea and vomiting that is not controlled by your nausea medication, call the clinic.   BELOW ARE SYMPTOMS THAT SHOULD BE REPORTED IMMEDIATELY:  *FEVER GREATER THAN 100.5 F  *CHILLS WITH OR WITHOUT FEVER  NAUSEA AND VOMITING THAT IS NOT CONTROLLED WITH YOUR NAUSEA MEDICATION  *UNUSUAL SHORTNESS OF BREATH  *UNUSUAL BRUISING OR BLEEDING  TENDERNESS IN MOUTH AND THROAT WITH OR WITHOUT PRESENCE OF ULCERS  *URINARY PROBLEMS  *BOWEL PROBLEMS  UNUSUAL RASH Items with * indicate a potential emergency and should be followed up as soon as possible.  Feel free to call the clinic you have any questions or concerns. The clinic phone number is [phone].  Please show the CHEMO ALERT CARD at check-in to the Emergency Department and triage nurse.   

## 2014-04-20 NOTE — Progress Notes (Signed)
[facility] [phone]   [phone]  OFFICE PROGRESS NOTE  [name], MD [place] [address]  DIAGNOSIS: MGUS diagnosed in September 2010, with additional symptoms suggestive of POEMS syndrome.   CURRENT THERAPY: Velcade 1.3 MG/M2 subcutaneously with Decadron 40 mg by mouth on a weekly basis. First cycle 11/24/2013. She status post 20 weekly doses of treatment.  INTERVAL HISTORY: Brenda Conner 54 y.o. female returns to the clinic today for followup visit. The patient is feeling fine today with no specific complaints. She is tolerating her current treatment with subcutaneous Velcade and Decadron fairly well except for 2 days of fatigue after the treatment probably secondary to withdrawal from the Decadron. She is also complaining of some seasonal allergy symptoms. She denied having any significant weight loss or night sweats. The patient denied having any fever or chills. She has no chest pain, shortness of breath, cough or hemoptysis. She denied having any aching pain. She had repeat myeloma panel and she is here for evaluation and discussion of her lab results.  MEDICAL HISTORY: Past Medical History  Diagnosis Date  . PONV (postoperative nausea and vomiting)   . Hypertension   . Anxiety   . Depression   . Asthma   . Sleep apnea     CPAP at bedtime  . Cancer     waldenstroms/ macroglobinulemia  . Hypercholesteremia   . Hypothyroidism   . Depression   . Macroglobulinemia     ? POEMS syndrome  . Acid reflux   . Multiple myeloma   . Diabetes mellitus without complication   . Obesity   . Sleep apnea     ALLERGIES:  is allergic to codeine; hydrocodone; lortab; onion; shellfish allergy; and amoxicillin.  MEDICATIONS:  Current Outpatient Prescriptions  Medication Sig Dispense Refill  . acyclovir (ZOVIRAX) 400 MG tablet Take 1 tablet (400 mg total) by mouth 2 (two) times daily. 60 tablet 3  . ALPRAZolam (XANAX)  0.5 MG tablet Take 0.5 mg by mouth at bedtime as needed for anxiety.     [name] aspirin 81 MG tablet Take 81 mg by mouth daily.    . Azelastine HCl 0.15 % SOLN as needed.    . Blood Glucose Monitoring Suppl (ONE TOUCH ULTRA MINI) W/DEVICE KIT See admin instructions.  0  . cetirizine (ZYRTEC) 10 MG tablet Take 10 mg by mouth at bedtime.     [name] dexamethasone (DECADRON) 4 MG tablet 10 tablet by mouth every week. Start with the first dose of chemotherapy. 40 tablet 3  . fexofenadine (ALLEGRA) 180 MG tablet Take 180 mg by mouth every morning.    . Fluticasone-Salmeterol (ADVAIR) 100-50 MCG/DOSE AEPB Inhale 2 puffs into the lungs every 12 (twelve) hours.    [name] ibuprofen (ADVIL,MOTRIN) 100 MG tablet Take 100 mg by mouth every 6 (six) hours as needed.    [name] levothyroxine (SYNTHROID, LEVOTHROID) 125 MCG tablet Take 125 mcg by mouth daily before breakfast.     . metFORMIN (GLUCOPHAGE) 500 MG tablet Take 1,000 mg by mouth 2 (two) times daily.  3  . Multiple Vitamins-Minerals (MULTIVITAMIN PO) Take by mouth.    [name] NOVOLOG FLEXPEN 100 UNIT/ML FlexPen 3 Units daily as needed.  6  . omeprazole (PRILOSEC) 40 MG capsule Take 40 mg by mouth 2 (two) times daily.     . ondansetron (ZOFRAN-ODT) 8 MG disintegrating tablet Take 8 mg by mouth every 8 (eight) hours as needed.  2  .  ONE TOUCH ULTRA TEST test strip See admin instructions.  11  . pravastatin (PRAVACHOL) 40 MG tablet Take 40 mg by mouth every evening.     . sertraline (ZOLOFT) 50 MG tablet 3 tablets (117m) po daily 90 tablet 12  . terconazole (TERAZOL 7) 0.4 % vaginal cream   0  . UNABLE TO FIND Med Name: Allergy shots    . verapamil (CALAN-SR) 240 MG CR tablet Take 240 mg by mouth 2 (two) times daily.    . calcium-vitamin D 250-100 MG-UNIT per tablet Take 1 tablet by mouth 2 (two) times daily.    . prochlorperazine (COMPAZINE) 10 MG tablet Take 1 tablet (10 mg total) by mouth every 6 (six) hours as needed for nausea or vomiting. (Patient not taking: Reported on  04/20/2014) 30 tablet 0  . VITAMIN D, CHOLECALCIFEROL, PO Take 4,000 Int'l Units by mouth daily.     No current facility-administered medications for this visit.    SURGICAL HISTORY:  Past Surgical History  Procedure Laterality Date  . Cholecystectomy    . Back surgery  03/2004    herniation, L4-L5  . Bil laprascopic knee surgery    . Uterine fibroid embolization    . Bone marrow biopsy      2011  . Laparoscopic cholecystectomy  1997  . Foot surgery  1998  . Nasal sinus surgery  2004  . Ablation  09/2007    HTA and polyp resection  . Knee arthroscopy w/ meniscal repair  10/10 ; 3/11  . Bone marrow biopsy  4/14    REVIEW OF SYSTEMS:  Constitutional: positive for fatigue Eyes: negative Ears, nose, mouth, throat, and face: negative Respiratory: negative Cardiovascular: negative Gastrointestinal: negative Genitourinary:negative Integument/breast: negative Hematologic/lymphatic: negative Musculoskeletal:negative Neurological: negative Behavioral/Psych: negative Endocrine: negative Allergic/Immunologic: negative   PHYSICAL EXAMINATION: General appearance: alert, cooperative and no distress Head: Normocephalic, without obvious abnormality, atraumatic Neck: no adenopathy Lymph nodes: Cervical, supraclavicular, and axillary nodes normal. Resp: clear to auscultation bilaterally Back: symmetric, no curvature. ROM normal. No CVA tenderness. Cardio: regular rate and rhythm, S1, S2 normal, no murmur, click, rub or gallop GI: soft, non-tender; bowel sounds normal; no masses,  no organomegaly Extremities: extremities normal, atraumatic, no cyanosis or edema Neurologic: Alert and oriented X 3, normal strength and tone. Normal symmetric reflexes. Normal coordination and gait  ECOG PERFORMANCE STATUS: 0 - Asymptomatic  Blood pressure 141/64, pulse 67, temperature 97.8 F (36.6 C), temperature source Oral, resp. rate 19, height 5' 7"  (1.702 m), weight 259 lb 11 oz (117.795 kg), SpO2 98  %.  LABORATORY DATA: Lab Results  Component Value Date   WBC 6.2 04/13/2014   HGB 11.2* 04/13/2014   HCT 34.2* 04/13/2014   MCV 90.5 04/13/2014   PLT 162 04/13/2014      Chemistry      Component Value Date/Time   NA 137 04/13/2014 0914   NA 137 03/20/2011 1509   K 3.8 04/13/2014 0914   K 3.4* 03/20/2011 1509   CL 104 04/07/2012 1415   CL 102 03/20/2011 1509   CO2 22 04/13/2014 0914   CO2 26 03/20/2011 1509   BUN 12.1 04/13/2014 0914   BUN 7 03/20/2011 1509   CREATININE 0.7 04/13/2014 0914   CREATININE 0.63 03/20/2011 1509      Component Value Date/Time   CALCIUM 9.3 04/13/2014 0914   CALCIUM 9.2 03/20/2011 1509   ALKPHOS 75 04/13/2014 0914   ALKPHOS 74 03/20/2011 1509   AST 26 04/13/2014 0914   AST  24 03/20/2011 1509   ALT 33 04/13/2014 0914   ALT 26 03/20/2011 1509   BILITOT 0.50 04/13/2014 0914   BILITOT 0.3 03/20/2011 1509     Other lab results: Beta-2 microglobulin 2.00, free kappa light chain 0.4, free lambda light chain 53.20 with a kappa/lambda ratio of 0.01. IgG 332, IgA 68 and IgM 547.  ASSESSMENT AND PLAN: This is a very pleasant 54 years old white female with a multiple myeloma initially diagnosed as monoclonal gammopathy in addition to peripheral neuropathy, and Endocrinopathy in the form of hypothyroidism and skin rash. The symptoms combined are suspicious for POEMS syndrome.  She is currently on treatment with subcutaneous tenderness Velcade and Decadron status post 20 cycles and tolerated her treatment fairly well. The recent myeloma panel showed further improvement in her disease. I discussed the lab result with the patient. I recommended for her to continue with the same treatment. She will come back for follow-up visit in 3 weeks for reevaluation and management of any adverse effect of her treatment after repeating myeloma panel for evaluation of her disease. She was advised to call immediately if her symptoms started getting worse as I may consider  the patient for treatment with multiple myeloma regimen. The patient agreed to the current plan.  All questions were answered. The patient knows to call the clinic with any problems, questions or concerns. We can certainly see the patient much sooner if necessary.  Disclaimer: This note was dictated with voice recognition software. Similar sounding words can inadvertently be transcribed and may not be corrected upon review.

## 2014-04-23 ENCOUNTER — Other Ambulatory Visit: Payer: Self-pay | Admitting: Medical Oncology

## 2014-04-23 DIAGNOSIS — C9000 Multiple myeloma not having achieved remission: Secondary | ICD-10-CM

## 2014-04-26 ENCOUNTER — Other Ambulatory Visit (HOSPITAL_BASED_OUTPATIENT_CLINIC_OR_DEPARTMENT_OTHER): Payer: BLUE CROSS/BLUE SHIELD

## 2014-04-26 ENCOUNTER — Ambulatory Visit (HOSPITAL_BASED_OUTPATIENT_CLINIC_OR_DEPARTMENT_OTHER): Payer: BLUE CROSS/BLUE SHIELD

## 2014-04-26 VITALS — BP 136/66 | HR 66 | Temp 98.0°F | Resp 18

## 2014-04-26 DIAGNOSIS — C9000 Multiple myeloma not having achieved remission: Secondary | ICD-10-CM

## 2014-04-26 DIAGNOSIS — Z5112 Encounter for antineoplastic immunotherapy: Secondary | ICD-10-CM

## 2014-04-26 LAB — CBC WITH DIFFERENTIAL/PLATELET
BASO%: 0.1 % (ref 0.0–2.0)
Basophils Absolute: 0 10*3/uL (ref 0.0–0.1)
EOS ABS: 0.1 10*3/uL (ref 0.0–0.5)
EOS%: 1.2 % (ref 0.0–7.0)
HCT: 35.1 % (ref 34.8–46.6)
HEMOGLOBIN: 11.6 g/dL (ref 11.6–15.9)
LYMPH#: 1.4 10*3/uL (ref 0.9–3.3)
LYMPH%: 19 % (ref 14.0–49.7)
MCH: 29.9 pg (ref 25.1–34.0)
MCHC: 33 g/dL (ref 31.5–36.0)
MCV: 90.5 fL (ref 79.5–101.0)
MONO#: 0.5 10*3/uL (ref 0.1–0.9)
MONO%: 6.7 % (ref 0.0–14.0)
NEUT#: 5.5 10*3/uL (ref 1.5–6.5)
NEUT%: 73 % (ref 38.4–76.8)
Platelets: 162 10*3/uL (ref 145–400)
RBC: 3.88 10*6/uL (ref 3.70–5.45)
RDW: 13.9 % (ref 11.2–14.5)
WBC: 7.6 10*3/uL (ref 3.9–10.3)

## 2014-04-26 LAB — COMPREHENSIVE METABOLIC PANEL (CC13)
ALBUMIN: 3.7 g/dL (ref 3.5–5.0)
ALT: 36 U/L (ref 0–55)
AST: 23 U/L (ref 5–34)
Alkaline Phosphatase: 65 U/L (ref 40–150)
Anion Gap: 14 mEq/L — ABNORMAL HIGH (ref 3–11)
BUN: 9.5 mg/dL (ref 7.0–26.0)
CALCIUM: 9.4 mg/dL (ref 8.4–10.4)
CHLORIDE: 101 meq/L (ref 98–109)
CO2: 22 mEq/L (ref 22–29)
Creatinine: 0.7 mg/dL (ref 0.6–1.1)
GLUCOSE: 116 mg/dL (ref 70–140)
POTASSIUM: 3.9 meq/L (ref 3.5–5.1)
Sodium: 138 mEq/L (ref 136–145)
Total Bilirubin: 0.39 mg/dL (ref 0.20–1.20)
Total Protein: 6.4 g/dL (ref 6.4–8.3)

## 2014-04-26 MED ORDER — ONDANSETRON HCL 8 MG PO TABS
ORAL_TABLET | ORAL | Status: AC
  Filled 2014-04-26: qty 1

## 2014-04-26 MED ORDER — BORTEZOMIB CHEMO SQ INJECTION 3.5 MG (2.5MG/ML)
1.3000 mg/m2 | Freq: Once | INTRAMUSCULAR | Status: AC
  Administered 2014-04-26: 3.25 mg via SUBCUTANEOUS
  Filled 2014-04-26: qty 3.25

## 2014-04-26 MED ORDER — ONDANSETRON HCL 8 MG PO TABS
8.0000 mg | ORAL_TABLET | Freq: Once | ORAL | Status: AC
  Administered 2014-04-26: 8 mg via ORAL

## 2014-04-26 NOTE — Patient Instructions (Signed)
[facility] Discharge Instructions for Patients Receiving Chemotherapy  Today you received the following chemotherapy agents Velcade.  To help prevent nausea and vomiting after your treatment, we encourage you to take your nausea medication Zofran 8 mg every 8 hours as needed or Compazine 10 mg every 6 hours as needed   If you develop nausea and vomiting that is not controlled by your nausea medication, call the clinic.   BELOW ARE SYMPTOMS THAT SHOULD BE REPORTED IMMEDIATELY:  *FEVER GREATER THAN 100.5 F  *CHILLS WITH OR WITHOUT FEVER  NAUSEA AND VOMITING THAT IS NOT CONTROLLED WITH YOUR NAUSEA MEDICATION  *UNUSUAL SHORTNESS OF BREATH  *UNUSUAL BRUISING OR BLEEDING  TENDERNESS IN MOUTH AND THROAT WITH OR WITHOUT PRESENCE OF ULCERS  *URINARY PROBLEMS  *BOWEL PROBLEMS  UNUSUAL RASH Items with * indicate a potential emergency and should be followed up as soon as possible.  Feel free to call the clinic you have any questions or concerns. The clinic phone number is [phone].  Please show the [facility] at check-in to the Emergency Department and triage nurse.

## 2014-05-10 ENCOUNTER — Other Ambulatory Visit: Payer: Self-pay | Admitting: Medical Oncology

## 2014-05-10 DIAGNOSIS — C9000 Multiple myeloma not having achieved remission: Secondary | ICD-10-CM

## 2014-05-11 ENCOUNTER — Ambulatory Visit (HOSPITAL_BASED_OUTPATIENT_CLINIC_OR_DEPARTMENT_OTHER): Payer: BLUE CROSS/BLUE SHIELD | Admitting: Nurse Practitioner

## 2014-05-11 ENCOUNTER — Ambulatory Visit (HOSPITAL_BASED_OUTPATIENT_CLINIC_OR_DEPARTMENT_OTHER): Payer: BLUE CROSS/BLUE SHIELD

## 2014-05-11 ENCOUNTER — Telehealth: Payer: Self-pay | Admitting: Internal Medicine

## 2014-05-11 ENCOUNTER — Other Ambulatory Visit (HOSPITAL_BASED_OUTPATIENT_CLINIC_OR_DEPARTMENT_OTHER): Payer: BLUE CROSS/BLUE SHIELD

## 2014-05-11 VITALS — BP 143/70 | HR 67 | Temp 97.9°F | Resp 20 | Ht 67.0 in | Wt 264.1 lb

## 2014-05-11 DIAGNOSIS — R600 Localized edema: Secondary | ICD-10-CM | POA: Diagnosis not present

## 2014-05-11 DIAGNOSIS — C9000 Multiple myeloma not having achieved remission: Secondary | ICD-10-CM | POA: Diagnosis not present

## 2014-05-11 DIAGNOSIS — Z5112 Encounter for antineoplastic immunotherapy: Secondary | ICD-10-CM

## 2014-05-11 LAB — COMPREHENSIVE METABOLIC PANEL (CC13)
ALT: 30 U/L (ref 0–55)
ANION GAP: 12 meq/L — AB (ref 3–11)
AST: 27 U/L (ref 5–34)
Albumin: 3.7 g/dL (ref 3.5–5.0)
Alkaline Phosphatase: 70 U/L (ref 40–150)
BILIRUBIN TOTAL: 0.39 mg/dL (ref 0.20–1.20)
BUN: 11.1 mg/dL (ref 7.0–26.0)
CO2: 23 meq/L (ref 22–29)
CREATININE: 0.7 mg/dL (ref 0.6–1.1)
Calcium: 9.2 mg/dL (ref 8.4–10.4)
Chloride: 103 mEq/L (ref 98–109)
GLUCOSE: 114 mg/dL (ref 70–140)
Potassium: 3.9 mEq/L (ref 3.5–5.1)
Sodium: 138 mEq/L (ref 136–145)
Total Protein: 6.4 g/dL (ref 6.4–8.3)

## 2014-05-11 LAB — CBC WITH DIFFERENTIAL/PLATELET
BASO%: 0.9 % (ref 0.0–2.0)
Basophils Absolute: 0.1 10*3/uL (ref 0.0–0.1)
EOS%: 1.8 % (ref 0.0–7.0)
Eosinophils Absolute: 0.1 10*3/uL (ref 0.0–0.5)
HEMATOCRIT: 33.2 % — AB (ref 34.8–46.6)
HEMOGLOBIN: 10.8 g/dL — AB (ref 11.6–15.9)
LYMPH%: 16.3 % (ref 14.0–49.7)
MCH: 28.7 pg (ref 25.1–34.0)
MCHC: 32.6 g/dL (ref 31.5–36.0)
MCV: 88.2 fL (ref 79.5–101.0)
MONO#: 0.4 10*3/uL (ref 0.1–0.9)
MONO%: 7.3 % (ref 0.0–14.0)
NEUT#: 4.1 10*3/uL (ref 1.5–6.5)
NEUT%: 73.7 % (ref 38.4–76.8)
Platelets: 196 10*3/uL (ref 145–400)
RBC: 3.76 10*6/uL (ref 3.70–5.45)
RDW: 14.5 % (ref 11.2–14.5)
WBC: 5.6 10*3/uL (ref 3.9–10.3)
lymph#: 0.9 10*3/uL (ref 0.9–3.3)

## 2014-05-11 MED ORDER — ONDANSETRON HCL 8 MG PO TABS
8.0000 mg | ORAL_TABLET | Freq: Once | ORAL | Status: AC
  Administered 2014-05-11: 8 mg via ORAL

## 2014-05-11 MED ORDER — BORTEZOMIB CHEMO SQ INJECTION 3.5 MG (2.5MG/ML)
1.3000 mg/m2 | Freq: Once | INTRAMUSCULAR | Status: AC
  Administered 2014-05-11: 3.25 mg via SUBCUTANEOUS
  Filled 2014-05-11: qty 3.25

## 2014-05-11 MED ORDER — ONDANSETRON HCL 8 MG PO TABS
ORAL_TABLET | ORAL | Status: AC
Start: 2014-05-11 — End: 2014-05-11
  Filled 2014-05-11: qty 1

## 2014-05-11 NOTE — Patient Instructions (Signed)
[facility] Discharge Instructions for Patients Receiving Chemotherapy  Today you received the following chemotherapy agents Velcade  To help prevent nausea and vomiting after your treatment, we encourage you to take your nausea medication Compazine 10 mg every 6 hours or Zofran 8 mg every 8 if needed.  If you develop nausea and vomiting that is not controlled by your nausea medication, call the clinic.   BELOW ARE SYMPTOMS THAT SHOULD BE REPORTED IMMEDIATELY:  *FEVER GREATER THAN 100.5 F  *CHILLS WITH OR WITHOUT FEVER  NAUSEA AND VOMITING THAT IS NOT CONTROLLED WITH YOUR NAUSEA MEDICATION  *UNUSUAL SHORTNESS OF BREATH  *UNUSUAL BRUISING OR BLEEDING  TENDERNESS IN MOUTH AND THROAT WITH OR WITHOUT PRESENCE OF ULCERS  *URINARY PROBLEMS  *BOWEL PROBLEMS  UNUSUAL RASH Items with * indicate a potential emergency and should be followed up as soon as possible.  Feel free to call the clinic you have any questions or concerns. The clinic phone number is [phone].  Please show the [facility] at check-in to the Emergency Department and triage nurse.

## 2014-05-11 NOTE — Progress Notes (Addendum)
[facility] OFFICE PROGRESS NOTE   DIAGNOSIS: MGUS diagnosed in September 2010, with additional symptoms suggestive of POEMS syndrome.   CURRENT THERAPY: Velcade 1.3 MG/M2 subcutaneously with Decadron 40 mg by mouth on a weekly basis. First cycle 11/24/2013. She status post 20 weekly doses of treatment.  INTERVAL HISTORY:   Brenda Conner returns as scheduled. She continues weekly Velcade/dexamethasone. She took last week off of treatment and went on a trip. She has completed 22 cycles. She has mild intermittent nausea which she feels is related to the Velcade. No vomiting. No mouth sores. She has occasional diarrhea. No numbness or tingling in her fingertips or toes. She has noted a rash over the forearms. She has had a similar rash in the past with sun exposure. She has noticed leg swelling since her recent trip. The swelling is improving. No calf pain. No shortness of breath or chest pain.  Objective:  Vital signs in last 24 hours:  Blood pressure 143/70, pulse 67, temperature 97.9 F (36.6 C), temperature source Oral, resp. rate 20, height 5' 7" (1.702 m), weight 264 lb 1.6 oz (119.795 kg), SpO2 99 %.    HEENT: No thrush or ulcers. Lymphatics: No palpable cervical or supraclavicular lymph nodes. Resp: Lungs clear bilaterally. Cardio: Regular rate and rhythm. GI: Abdomen soft and nontender. No hepatomegaly. Vascular: Trace to 1+ pitting edema at the lower legs bilaterally. Calves nontender.  Skin: Faint erythematous follicular-appearing rash over the forearms bilaterally.    Lab Results:  Lab Results  Component Value Date   WBC 5.6 05/11/2014   HGB 10.8* 05/11/2014   HCT 33.2* 05/11/2014   MCV 88.2 05/11/2014   PLT 196 05/11/2014   NEUTROABS 4.1 05/11/2014    Imaging:  No results found.  Medications: I have reviewed the patient's current medications.  Assessment/Plan: 1. Multiple myeloma initially diagnosed as monoclonal gammopathy in addition to  peripheral neuropathy and endocrinopathy in the form of hypothyroidism and skin rash. The symptoms combined are suspicious for POEMS syndrome. She is currently on treatment with subcutaneous weekly Velcade and Decadron status post 22 cycles.    Disposition: Brenda Conner appears stable. Plan to continue weekly Velcade/dexamethasone. We discussed that the bilateral leg swelling is most likely related to her recent trip. She understands to contact the office if the swelling does not continue to improve or she develops calf pain. She will return for a follow-up visit in 3 weeks.  Patient seen with Dr. Julien Nordmann.    Ned Card ANP/GNP-BC   05/11/2014  9:41 AM  ADDENDUM: Hematology/Oncology Attending: I had a face to face encounter with the patient today. I recommended her care plan. This is a very pleasant 54 years old white female with plasma cell dyscrasia currently undergoing systemic treatment with weekly subcutaneous Velcade and Decadron status post 22 weekly doses of treatment and tolerating her treatment fairly well. The patient just came back home from trip to [place] to visit her family. She has mild edema of the lower extremities but has been improving over the last few days. She denied having any significant chest pain, shortness breath, cough or hemoptysis. I recommended for the patient to resume her treatment with continuous Velcade and Decadron as a scheduled. She would come back for follow-up visit in 3 weeks for reevaluation. The patient was advised to call immediately if she has any concerning symptoms in the interval.  Disclaimer: This note was dictated with voice recognition software. Similar sounding words can inadvertently be transcribed and may be  missed upon review. Eilleen Kempf., MD 05/11/2014

## 2014-05-11 NOTE — Telephone Encounter (Signed)
Gave patient avs report and appointments for May and June. No standing lab order - tx scheduled wkly.

## 2014-05-18 ENCOUNTER — Other Ambulatory Visit (HOSPITAL_BASED_OUTPATIENT_CLINIC_OR_DEPARTMENT_OTHER): Payer: BLUE CROSS/BLUE SHIELD

## 2014-05-18 ENCOUNTER — Ambulatory Visit (HOSPITAL_BASED_OUTPATIENT_CLINIC_OR_DEPARTMENT_OTHER): Payer: BLUE CROSS/BLUE SHIELD

## 2014-05-18 VITALS — BP 141/58 | HR 59 | Temp 98.2°F | Resp 18

## 2014-05-18 DIAGNOSIS — C9000 Multiple myeloma not having achieved remission: Secondary | ICD-10-CM

## 2014-05-18 DIAGNOSIS — Z5112 Encounter for antineoplastic immunotherapy: Secondary | ICD-10-CM | POA: Diagnosis not present

## 2014-05-18 LAB — CBC WITH DIFFERENTIAL/PLATELET
BASO%: 1.2 % (ref 0.0–2.0)
Basophils Absolute: 0.1 10*3/uL (ref 0.0–0.1)
EOS ABS: 0.1 10*3/uL (ref 0.0–0.5)
EOS%: 2 % (ref 0.0–7.0)
HEMATOCRIT: 36.5 % (ref 34.8–46.6)
HGB: 11.9 g/dL (ref 11.6–15.9)
LYMPH%: 19.8 % (ref 14.0–49.7)
MCH: 28.7 pg (ref 25.1–34.0)
MCHC: 32.7 g/dL (ref 31.5–36.0)
MCV: 87.5 fL (ref 79.5–101.0)
MONO#: 0.4 10*3/uL (ref 0.1–0.9)
MONO%: 6 % (ref 0.0–14.0)
NEUT#: 4.4 10*3/uL (ref 1.5–6.5)
NEUT%: 71 % (ref 38.4–76.8)
PLATELETS: 190 10*3/uL (ref 145–400)
RBC: 4.17 10*6/uL (ref 3.70–5.45)
RDW: 14.4 % (ref 11.2–14.5)
WBC: 6.2 10*3/uL (ref 3.9–10.3)
lymph#: 1.2 10*3/uL (ref 0.9–3.3)

## 2014-05-18 LAB — COMPREHENSIVE METABOLIC PANEL (CC13)
ALK PHOS: 65 U/L (ref 40–150)
ALT: 32 U/L (ref 0–55)
AST: 22 U/L (ref 5–34)
Albumin: 3.8 g/dL (ref 3.5–5.0)
Anion Gap: 14 mEq/L — ABNORMAL HIGH (ref 3–11)
BUN: 13.5 mg/dL (ref 7.0–26.0)
CALCIUM: 9.4 mg/dL (ref 8.4–10.4)
CO2: 23 meq/L (ref 22–29)
Chloride: 103 mEq/L (ref 98–109)
Creatinine: 0.7 mg/dL (ref 0.6–1.1)
EGFR: >90 mL/min/{1.73_m2} (ref >90)
Glucose: 106 mg/dl (ref 70–140)
POTASSIUM: 4.1 meq/L (ref 3.5–5.1)
SODIUM: 139 meq/L (ref 136–145)
TOTAL PROTEIN: 6.6 g/dL (ref 6.4–8.3)
Total Bilirubin: 0.4 mg/dL (ref 0.20–1.20)

## 2014-05-18 MED ORDER — ONDANSETRON HCL 8 MG PO TABS
ORAL_TABLET | ORAL | Status: AC
  Filled 2014-05-18: qty 1

## 2014-05-18 MED ORDER — BORTEZOMIB CHEMO SQ INJECTION 3.5 MG (2.5MG/ML)
1.3000 mg/m2 | Freq: Once | INTRAMUSCULAR | Status: AC
  Administered 2014-05-18: 3.25 mg via SUBCUTANEOUS
  Filled 2014-05-18: qty 3.25

## 2014-05-18 MED ORDER — ONDANSETRON HCL 8 MG PO TABS
8.0000 mg | ORAL_TABLET | Freq: Once | ORAL | Status: AC
  Administered 2014-05-18: 8 mg via ORAL

## 2014-05-18 NOTE — Patient Instructions (Signed)
[facility] Discharge Instructions for Patients Receiving Chemotherapy  Today you received the following chemotherapy agents:  Velcade  To help prevent nausea and vomiting after your treatment, we encourage you to take your nausea medication as prescribed.   If you develop nausea and vomiting that is not controlled by your nausea medication, call the clinic.   BELOW ARE SYMPTOMS THAT SHOULD BE REPORTED IMMEDIATELY:  *FEVER GREATER THAN 100.5 F  *CHILLS WITH OR WITHOUT FEVER  NAUSEA AND VOMITING THAT IS NOT CONTROLLED WITH YOUR NAUSEA MEDICATION  *UNUSUAL SHORTNESS OF BREATH  *UNUSUAL BRUISING OR BLEEDING  TENDERNESS IN MOUTH AND THROAT WITH OR WITHOUT PRESENCE OF ULCERS  *URINARY PROBLEMS  *BOWEL PROBLEMS  UNUSUAL RASH Items with * indicate a potential emergency and should be followed up as soon as possible.  Feel free to call the clinic you have any questions or concerns. The clinic phone number is [phone].  Please show the CHEMO ALERT CARD at check-in to the Emergency Department and triage nurse.   

## 2014-05-25 ENCOUNTER — Other Ambulatory Visit (HOSPITAL_BASED_OUTPATIENT_CLINIC_OR_DEPARTMENT_OTHER): Payer: BLUE CROSS/BLUE SHIELD

## 2014-05-25 ENCOUNTER — Ambulatory Visit (HOSPITAL_BASED_OUTPATIENT_CLINIC_OR_DEPARTMENT_OTHER): Payer: BLUE CROSS/BLUE SHIELD

## 2014-05-25 VITALS — BP 149/81 | HR 58 | Temp 97.7°F | Resp 18

## 2014-05-25 DIAGNOSIS — Z5112 Encounter for antineoplastic immunotherapy: Secondary | ICD-10-CM | POA: Diagnosis not present

## 2014-05-25 DIAGNOSIS — C9000 Multiple myeloma not having achieved remission: Secondary | ICD-10-CM

## 2014-05-25 LAB — COMPREHENSIVE METABOLIC PANEL (CC13)
ALT: 28 U/L (ref 0–55)
AST: 21 U/L (ref 5–34)
Albumin: 3.6 g/dL (ref 3.5–5.0)
Alkaline Phosphatase: 67 U/L (ref 40–150)
Anion Gap: 13 mEq/L — ABNORMAL HIGH (ref 3–11)
BILIRUBIN TOTAL: 0.42 mg/dL (ref 0.20–1.20)
BUN: 14.2 mg/dL (ref 7.0–26.0)
CO2: 24 meq/L (ref 22–29)
CREATININE: 0.7 mg/dL (ref 0.6–1.1)
Calcium: 9.3 mg/dL (ref 8.4–10.4)
Chloride: 103 mEq/L (ref 98–109)
Glucose: 108 mg/dl (ref 70–140)
POTASSIUM: 4 meq/L (ref 3.5–5.1)
SODIUM: 139 meq/L (ref 136–145)
Total Protein: 6.2 g/dL — ABNORMAL LOW (ref 6.4–8.3)

## 2014-05-25 LAB — CBC WITH DIFFERENTIAL/PLATELET
BASO%: 0.2 % (ref 0.0–2.0)
Basophils Absolute: 0 10*3/uL (ref 0.0–0.1)
EOS%: 1.2 % (ref 0.0–7.0)
Eosinophils Absolute: 0.1 10*3/uL (ref 0.0–0.5)
HCT: 34.1 % — ABNORMAL LOW (ref 34.8–46.6)
HGB: 11.1 g/dL — ABNORMAL LOW (ref 11.6–15.9)
LYMPH#: 1.8 10*3/uL (ref 0.9–3.3)
LYMPH%: 19.4 % (ref 14.0–49.7)
MCH: 29.1 pg (ref 25.1–34.0)
MCHC: 32.6 g/dL (ref 31.5–36.0)
MCV: 89.3 fL (ref 79.5–101.0)
MONO#: 0.5 10*3/uL (ref 0.1–0.9)
MONO%: 6 % (ref 0.0–14.0)
NEUT#: 6.6 10*3/uL — ABNORMAL HIGH (ref 1.5–6.5)
NEUT%: 73.2 % (ref 38.4–76.8)
Platelets: 167 10*3/uL (ref 145–400)
RBC: 3.82 10*6/uL (ref 3.70–5.45)
RDW: 14 % (ref 11.2–14.5)
WBC: 9 10*3/uL (ref 3.9–10.3)

## 2014-05-25 MED ORDER — ONDANSETRON HCL 8 MG PO TABS
8.0000 mg | ORAL_TABLET | Freq: Once | ORAL | Status: AC
  Administered 2014-05-25: 8 mg via ORAL

## 2014-05-25 MED ORDER — BORTEZOMIB CHEMO SQ INJECTION 3.5 MG (2.5MG/ML)
1.3000 mg/m2 | Freq: Once | INTRAMUSCULAR | Status: AC
  Administered 2014-05-25: 3.25 mg via SUBCUTANEOUS
  Filled 2014-05-25: qty 3.25

## 2014-05-25 MED ORDER — ONDANSETRON HCL 8 MG PO TABS
ORAL_TABLET | ORAL | Status: AC
  Filled 2014-05-25: qty 1

## 2014-05-25 NOTE — Patient Instructions (Signed)
[facility] Discharge Instructions for Patients Receiving Chemotherapy  Today you received the following chemotherapy agents:  Velcade  To help prevent nausea and vomiting after your treatment, we encourage you to take your nausea medication as prescribed.   If you develop nausea and vomiting that is not controlled by your nausea medication, call the clinic.   BELOW ARE SYMPTOMS THAT SHOULD BE REPORTED IMMEDIATELY:  *FEVER GREATER THAN 100.5 F  *CHILLS WITH OR WITHOUT FEVER  NAUSEA AND VOMITING THAT IS NOT CONTROLLED WITH YOUR NAUSEA MEDICATION  *UNUSUAL SHORTNESS OF BREATH  *UNUSUAL BRUISING OR BLEEDING  TENDERNESS IN MOUTH AND THROAT WITH OR WITHOUT PRESENCE OF ULCERS  *URINARY PROBLEMS  *BOWEL PROBLEMS  UNUSUAL RASH Items with * indicate a potential emergency and should be followed up as soon as possible.  Feel free to call the clinic you have any questions or concerns. The clinic phone number is [phone].  Please show the CHEMO ALERT CARD at check-in to the Emergency Department and triage nurse.   

## 2014-06-01 ENCOUNTER — Other Ambulatory Visit (HOSPITAL_BASED_OUTPATIENT_CLINIC_OR_DEPARTMENT_OTHER): Payer: BLUE CROSS/BLUE SHIELD

## 2014-06-01 ENCOUNTER — Ambulatory Visit (HOSPITAL_BASED_OUTPATIENT_CLINIC_OR_DEPARTMENT_OTHER): Payer: BLUE CROSS/BLUE SHIELD | Admitting: Physician Assistant

## 2014-06-01 ENCOUNTER — Ambulatory Visit (HOSPITAL_BASED_OUTPATIENT_CLINIC_OR_DEPARTMENT_OTHER): Payer: BLUE CROSS/BLUE SHIELD

## 2014-06-01 ENCOUNTER — Telehealth: Payer: Self-pay | Admitting: *Deleted

## 2014-06-01 ENCOUNTER — Telehealth: Payer: Self-pay | Admitting: Physician Assistant

## 2014-06-01 ENCOUNTER — Encounter: Payer: Self-pay | Admitting: Physician Assistant

## 2014-06-01 VITALS — BP 150/63 | HR 64 | Temp 97.7°F | Resp 18 | Ht 67.0 in | Wt 258.1 lb

## 2014-06-01 DIAGNOSIS — C9000 Multiple myeloma not having achieved remission: Secondary | ICD-10-CM

## 2014-06-01 DIAGNOSIS — Z5112 Encounter for antineoplastic immunotherapy: Secondary | ICD-10-CM

## 2014-06-01 LAB — COMPREHENSIVE METABOLIC PANEL (CC13)
ALT: 29 U/L (ref 0–55)
AST: 25 U/L (ref 5–34)
Albumin: 3.5 g/dL (ref 3.5–5.0)
Alkaline Phosphatase: 70 U/L (ref 40–150)
Anion Gap: 13 mEq/L — ABNORMAL HIGH (ref 3–11)
BUN: 8.4 mg/dL (ref 7.0–26.0)
CO2: 22 mEq/L (ref 22–29)
CREATININE: 0.8 mg/dL (ref 0.6–1.1)
Calcium: 8.9 mg/dL (ref 8.4–10.4)
Chloride: 103 mEq/L (ref 98–109)
Glucose: 107 mg/dl (ref 70–140)
Potassium: 3.8 mEq/L (ref 3.5–5.1)
SODIUM: 139 meq/L (ref 136–145)
Total Bilirubin: 0.52 mg/dL (ref 0.20–1.20)
Total Protein: 6.2 g/dL — ABNORMAL LOW (ref 6.4–8.3)

## 2014-06-01 LAB — CBC WITH DIFFERENTIAL/PLATELET
BASO%: 0.3 % (ref 0.0–2.0)
Basophils Absolute: 0 10*3/uL (ref 0.0–0.1)
EOS%: 1.2 % (ref 0.0–7.0)
Eosinophils Absolute: 0.1 10*3/uL (ref 0.0–0.5)
HEMATOCRIT: 34.1 % — AB (ref 34.8–46.6)
HGB: 11.2 g/dL — ABNORMAL LOW (ref 11.6–15.9)
LYMPH%: 15.5 % (ref 14.0–49.7)
MCH: 29.4 pg (ref 25.1–34.0)
MCHC: 32.8 g/dL (ref 31.5–36.0)
MCV: 89.5 fL (ref 79.5–101.0)
MONO#: 0.5 10*3/uL (ref 0.1–0.9)
MONO%: 7.7 % (ref 0.0–14.0)
NEUT%: 75.3 % (ref 38.4–76.8)
NEUTROS ABS: 5.1 10*3/uL (ref 1.5–6.5)
PLATELETS: 148 10*3/uL (ref 145–400)
RBC: 3.81 10*6/uL (ref 3.70–5.45)
RDW: 14.4 % (ref 11.2–14.5)
WBC: 6.7 10*3/uL (ref 3.9–10.3)
lymph#: 1 10*3/uL (ref 0.9–3.3)

## 2014-06-01 MED ORDER — ACYCLOVIR 400 MG PO TABS: 400.0000 mg | ORAL_TABLET | Freq: Two times a day (BID) | ORAL | Status: DC

## 2014-06-01 MED ORDER — BORTEZOMIB CHEMO SQ INJECTION 3.5 MG (2.5MG/ML)
1.3000 mg/m2 | Freq: Once | INTRAMUSCULAR | Status: AC
  Administered 2014-06-01: 3.25 mg via SUBCUTANEOUS
  Filled 2014-06-01: qty 3.25

## 2014-06-01 MED ORDER — ONDANSETRON HCL 8 MG PO TABS
8.0000 mg | ORAL_TABLET | Freq: Once | ORAL | Status: AC
  Administered 2014-06-01: 8 mg via ORAL

## 2014-06-01 MED ORDER — DEXAMETHASONE 4 MG PO TABS: ORAL_TABLET | ORAL | Status: DC

## 2014-06-01 MED ORDER — ONDANSETRON HCL 8 MG PO TABS
ORAL_TABLET | ORAL | Status: AC
Start: 2014-06-01 — End: 2014-06-01
  Filled 2014-06-01: qty 1

## 2014-06-01 NOTE — Telephone Encounter (Signed)
I have adjusted 6/14 appt

## 2014-06-01 NOTE — Telephone Encounter (Signed)
Pt confirmed labs/ov per 05/24 POF, gave pt AVS and Calendar..... KJ, sent msg to move chemo down due to adding office visit

## 2014-06-01 NOTE — Progress Notes (Addendum)
[place] [phone]   [phone]  OFFICE PROGRESS NOTE  Brenda Kern, MD Brenda Conner [place] [address]  DIAGNOSIS: MGUS diagnosed in September 2010, with additional symptoms suggestive of POEMS syndrome.   CURRENT THERAPY: Velcade 1.3 MG/M2 subcutaneously with Decadron 40 mg by mouth on a weekly basis. First cycle 11/24/2013. She is status post 23 weekly doses of treatment.  INTERVAL HISTORY: Brenda Conner 54 y.o. female returns to the clinic today for followup visit accompanied by her mother who has been visiting. The patient is feeling fine today with no specific complaints. She is tolerating her current treatment with subcutaneous Velcade and Decadron fairly well except for about 2 days of fatigue after the treatment probably secondary to withdrawal from the Decadron. She is also complaining of some seasonal allergy symptoms. She denied having any significant weight loss or night sweats. The patient denied having any fever or chills. She has no chest pain, shortness of breath, cough or hemoptysis. She denied having any aching pain. She requests refills for her acyclovir and dexamethasone.  MEDICAL HISTORY: Past Medical History  Diagnosis Date  . PONV (postoperative nausea and vomiting)   . Hypertension   . Anxiety   . Depression   . Asthma   . Sleep apnea     CPAP at bedtime  . Cancer     waldenstroms/ macroglobinulemia  . Hypercholesteremia   . Hypothyroidism   . Depression   . Macroglobulinemia     ? POEMS syndrome  . Acid reflux   . Multiple myeloma   . Diabetes mellitus without complication   . Obesity   . Sleep apnea     ALLERGIES:  is allergic to codeine; hydrocodone; lortab; onion; shellfish allergy; and amoxicillin.  MEDICATIONS:  Current Outpatient Prescriptions  Medication Sig Dispense Refill  . acyclovir (ZOVIRAX) 400 MG tablet Take 1 tablet (400 mg total) by mouth 2 (two) times daily. 60 tablet 3  .  aspirin 81 MG tablet Take 81 mg by mouth daily.    . Azelastine HCl 0.15 % SOLN as needed.    . Blood Glucose Monitoring Suppl (ONE TOUCH ULTRA MINI) W/DEVICE KIT See admin instructions.  0  . cetirizine (ZYRTEC) 10 MG tablet Take 10 mg by mouth at bedtime.     [name] dexamethasone (DECADRON) 4 MG tablet 10 tablet by mouth every week. Start with the first dose of chemotherapy. 40 tablet 3  . fexofenadine (ALLEGRA) 180 MG tablet Take 180 mg by mouth every morning.    . Fluticasone-Salmeterol (ADVAIR) 100-50 MCG/DOSE AEPB Inhale 2 puffs into the lungs every 12 (twelve) hours.    [name] levothyroxine (SYNTHROID, LEVOTHROID) 125 MCG tablet Take 125 mcg by mouth daily before breakfast.     . metFORMIN (GLUCOPHAGE) 500 MG tablet Take 1,000 mg by mouth 2 (two) times daily.  3  . NOVOLOG FLEXPEN 100 UNIT/ML FlexPen 3 Units daily as needed.  6  . omeprazole (PRILOSEC) 40 MG capsule Take 40 mg by mouth 2 (two) times daily.     . ondansetron (ZOFRAN-ODT) 8 MG disintegrating tablet Take 8 mg by mouth every 8 (eight) hours as needed.  2  . ONE TOUCH ULTRA TEST test strip See admin instructions.  11  . pravastatin (PRAVACHOL) 40 MG tablet Take 40 mg by mouth every evening.     . sertraline (ZOLOFT) 50 MG tablet 3 tablets (137m) po daily 90 tablet 12  . UNABLE TO FIND Med Name:  Allergy shots    . verapamil (CALAN-SR) 240 MG CR tablet Take 240 mg by mouth 2 (two) times daily.    [name] ALPRAZolam (XANAX) 0.5 MG tablet Take 0.5 mg by mouth at bedtime as needed for anxiety.     . calcium-vitamin D 250-100 MG-UNIT per tablet Take 1 tablet by mouth 2 (two) times daily.    [name] ibuprofen (ADVIL,MOTRIN) 100 MG tablet Take 100 mg by mouth every 6 (six) hours as needed.    . Multiple Vitamins-Minerals (MULTIVITAMIN PO) Take by mouth.    . prochlorperazine (COMPAZINE) 10 MG tablet Take 1 tablet (10 mg total) by mouth every 6 (six) hours as needed for nausea or vomiting. (Patient not taking: Reported on 05/11/2014) 30 tablet 0  .  terconazole (TERAZOL 7) 0.4 % vaginal cream   0  . VITAMIN D, CHOLECALCIFEROL, PO Take 4,000 Int'l Units by mouth daily.     No current facility-administered medications for this visit.    SURGICAL HISTORY:  Past Surgical History  Procedure Laterality Date  . Cholecystectomy    . Back surgery  03/2004    herniation, L4-L5  . Bil laprascopic knee surgery    . Uterine fibroid embolization    . Bone marrow biopsy      2011  . Laparoscopic cholecystectomy  1997  . Foot surgery  1998  . Nasal sinus surgery  2004  . Ablation  09/2007    HTA and polyp resection  . Knee arthroscopy w/ meniscal repair  10/10 ; 3/11  . Bone marrow biopsy  4/14    REVIEW OF SYSTEMS:  Constitutional: positive for fatigue Eyes: negative Ears, nose, mouth, throat, and face: negative Respiratory: negative Cardiovascular: negative Gastrointestinal: negative Genitourinary:negative Integument/breast: negative Hematologic/lymphatic: negative Musculoskeletal:negative Neurological: negative Behavioral/Psych: negative Endocrine: negative Allergic/Immunologic: negative   PHYSICAL EXAMINATION: General appearance: alert, cooperative and no distress Head: Normocephalic, without obvious abnormality, atraumatic Neck: no adenopathy Lymph nodes: Cervical, supraclavicular, and axillary nodes normal. Resp: clear to auscultation bilaterally Back: symmetric, no curvature. ROM normal. No CVA tenderness. Cardio: regular rate and rhythm, S1, S2 normal, no murmur, click, rub or gallop GI: soft, non-tender; bowel sounds normal; no masses,  no organomegaly Extremities: extremities normal, atraumatic, no cyanosis or edema Neurologic: Alert and oriented X 3, normal strength and tone. Normal symmetric reflexes. Normal coordination and gait  ECOG PERFORMANCE STATUS: 0 - Asymptomatic  Blood pressure 150/63, pulse 64, temperature 97.7 F (36.5 C), temperature source Oral, resp. rate 18, height _0  (1.702 m), weight 258 lb  1.6 oz (117.073 kg), SpO2 100 %.  LABORATORY DATA: Lab Results  Component Value Date   WBC 6.7 06/01/2014   HGB 11.2* 06/01/2014   HCT 34.1* 06/01/2014   MCV 89.5 06/01/2014   PLT 148 06/01/2014      Chemistry      Component Value Date/Time   NA 139 06/01/2014 0817   NA 137 03/20/2011 1509   K 3.8 06/01/2014 0817   K 3.4* 03/20/2011 1509   CL 104 04/07/2012 1415   CL 102 03/20/2011 1509   CO2 22 06/01/2014 0817   CO2 26 03/20/2011 1509   BUN 8.4 06/01/2014 0817   BUN 7 03/20/2011 1509   CREATININE 0.8 06/01/2014 0817   CREATININE 0.63 03/20/2011 1509      Component Value Date/Time   CALCIUM 8.9 06/01/2014 0817   CALCIUM 9.2 03/20/2011 1509   ALKPHOS 70 06/01/2014 0817   ALKPHOS 74 03/20/2011 1509   AST 25 06/01/2014 0817  AST 24 03/20/2011 1509   ALT 29 06/01/2014 0817   ALT 26 03/20/2011 1509   BILITOT 0.52 06/01/2014 0817   BILITOT 0.3 03/20/2011 1509     Other lab results: Beta-2 microglobulin 2.00, free kappa light chain 0.4, free lambda light chain 53.20 with a kappa/lambda ratio of 0.01. IgG 332, IgA 68 and IgM 547.  ASSESSMENT AND PLAN: This is a very pleasant 54 years old white female with a multiple myeloma initially diagnosed as monoclonal gammopathy in addition to peripheral neuropathy, and Endocrinopathy in the form of hypothyroidism and skin rash. The symptoms combined are suspicious for POEMS syndrome.  She is currently on treatment with subcutaneous tenderness Velcade and Decadron status post 23 cycles and tolerated her treatment fairly well. The recent myeloma panel showed further improvement in her disease. The patient was discussed with and also seen by Dr.Mohamed. She will continue with her current treatment as scheduled. She will follow up in 3 weeks for another symptom management visit. Refills for her acyclovir and dexamethasone were sent to her pharmacy of record via [facility].   She was advised to call immediately if her symptoms started getting  worse as I may consider the patient for treatment with multiple myeloma regimen. The patient agreed to the current plan.  All questions were answered. The patient knows to call the clinic with any problems, questions or concerns. We can certainly see the patient much sooner if necessary.  Sampson SiADDENDUM: Hematology/Oncology Attending: I had a face to face encounter with the patient. I recommended her care plan.  This is a very pleasant 54 years old white female with plasma cell dyscrasia currently undergoing treatment with subcutaneous weekly Velcade as well as Decadron status post 23 cycles. She is tolerating her treatment well with no significant adverse effects. I recommended for the patient to continue her current treatment with Velcade and Decadron as a scheduled. She will continue on acyclovir for prophylaxis. The patient would come back for follow-up visit in 3 weeks for reevaluation and management of any adverse effect of her treatment. She was advised to call immediately if she has any concerning symptoms in the interval.  Disclaimer: This note was dictated with voice recognition software. Similar sounding words can inadvertently be transcribed and may be missed upon review. Eilleen Kempf., MD 06/07/2014

## 2014-06-05 NOTE — Patient Instructions (Signed)
Continue labs and chemotherapy as scheduled Follow up in 3 weeks 

## 2014-06-08 ENCOUNTER — Ambulatory Visit (HOSPITAL_BASED_OUTPATIENT_CLINIC_OR_DEPARTMENT_OTHER): Payer: BLUE CROSS/BLUE SHIELD

## 2014-06-08 ENCOUNTER — Other Ambulatory Visit (HOSPITAL_BASED_OUTPATIENT_CLINIC_OR_DEPARTMENT_OTHER): Payer: BLUE CROSS/BLUE SHIELD

## 2014-06-08 VITALS — BP 138/66 | HR 64 | Temp 98.5°F

## 2014-06-08 DIAGNOSIS — C9000 Multiple myeloma not having achieved remission: Secondary | ICD-10-CM | POA: Diagnosis not present

## 2014-06-08 DIAGNOSIS — Z5112 Encounter for antineoplastic immunotherapy: Secondary | ICD-10-CM

## 2014-06-08 LAB — CBC WITH DIFFERENTIAL/PLATELET
BASO%: 0.4 % (ref 0.0–2.0)
Basophils Absolute: 0 10*3/uL (ref 0.0–0.1)
EOS ABS: 0.1 10*3/uL (ref 0.0–0.5)
EOS%: 1.3 % (ref 0.0–7.0)
HCT: 32.7 % — ABNORMAL LOW (ref 34.8–46.6)
HEMOGLOBIN: 11.1 g/dL — AB (ref 11.6–15.9)
LYMPH#: 1.2 10*3/uL (ref 0.9–3.3)
LYMPH%: 16.4 % (ref 14.0–49.7)
MCH: 29.2 pg (ref 25.1–34.0)
MCHC: 33.8 g/dL (ref 31.5–36.0)
MCV: 86.5 fL (ref 79.5–101.0)
MONO#: 0.5 10*3/uL (ref 0.1–0.9)
MONO%: 6.6 % (ref 0.0–14.0)
NEUT%: 75.3 % (ref 38.4–76.8)
NEUTROS ABS: 5.7 10*3/uL (ref 1.5–6.5)
Platelets: 174 10*3/uL (ref 145–400)
RBC: 3.78 10*6/uL (ref 3.70–5.45)
RDW: 14.9 % — AB (ref 11.2–14.5)
WBC: 7.6 10*3/uL (ref 3.9–10.3)

## 2014-06-08 LAB — COMPREHENSIVE METABOLIC PANEL (CC13)
ALT: 27 U/L (ref 0–55)
AST: 23 U/L (ref 5–34)
Albumin: 3.6 g/dL (ref 3.5–5.0)
Alkaline Phosphatase: 69 U/L (ref 40–150)
Anion Gap: 11 mEq/L (ref 3–11)
BILIRUBIN TOTAL: 0.46 mg/dL (ref 0.20–1.20)
BUN: 11.7 mg/dL (ref 7.0–26.0)
CALCIUM: 9.3 mg/dL (ref 8.4–10.4)
CO2: 24 meq/L (ref 22–29)
CREATININE: 0.7 mg/dL (ref 0.6–1.1)
Chloride: 102 mEq/L (ref 98–109)
GLUCOSE: 114 mg/dL (ref 70–140)
POTASSIUM: 4 meq/L (ref 3.5–5.1)
SODIUM: 136 meq/L (ref 136–145)
Total Protein: 6.5 g/dL (ref 6.4–8.3)

## 2014-06-08 MED ORDER — ONDANSETRON HCL 8 MG PO TABS
ORAL_TABLET | ORAL | Status: AC
  Filled 2014-06-08: qty 1

## 2014-06-08 MED ORDER — BORTEZOMIB CHEMO SQ INJECTION 3.5 MG (2.5MG/ML)
1.3000 mg/m2 | Freq: Once | INTRAMUSCULAR | Status: AC
  Administered 2014-06-08: 3.25 mg via SUBCUTANEOUS
  Filled 2014-06-08: qty 3.25

## 2014-06-08 MED ORDER — ONDANSETRON HCL 8 MG PO TABS
8.0000 mg | ORAL_TABLET | Freq: Once | ORAL | Status: AC
  Administered 2014-06-08: 8 mg via ORAL

## 2014-06-08 NOTE — Patient Instructions (Signed)
[facility] Discharge Instructions for Patients Receiving Chemotherapy  Today you received the following chemotherapy agents:  Velcade  To help prevent nausea and vomiting after your treatment, we encourage you to take your nausea medication as prescribed.   If you develop nausea and vomiting that is not controlled by your nausea medication, call the clinic.   BELOW ARE SYMPTOMS THAT SHOULD BE REPORTED IMMEDIATELY:  *FEVER GREATER THAN 100.5 F  *CHILLS WITH OR WITHOUT FEVER  NAUSEA AND VOMITING THAT IS NOT CONTROLLED WITH YOUR NAUSEA MEDICATION  *UNUSUAL SHORTNESS OF BREATH  *UNUSUAL BRUISING OR BLEEDING  TENDERNESS IN MOUTH AND THROAT WITH OR WITHOUT PRESENCE OF ULCERS  *URINARY PROBLEMS  *BOWEL PROBLEMS  UNUSUAL RASH Items with * indicate a potential emergency and should be followed up as soon as possible.  Feel free to call the clinic you have any questions or concerns. The clinic phone number is [phone].  Please show the CHEMO ALERT CARD at check-in to the Emergency Department and triage nurse.   

## 2014-06-15 ENCOUNTER — Other Ambulatory Visit: Payer: Self-pay | Admitting: Internal Medicine

## 2014-06-15 ENCOUNTER — Other Ambulatory Visit (HOSPITAL_BASED_OUTPATIENT_CLINIC_OR_DEPARTMENT_OTHER): Payer: BLUE CROSS/BLUE SHIELD

## 2014-06-15 ENCOUNTER — Ambulatory Visit (HOSPITAL_BASED_OUTPATIENT_CLINIC_OR_DEPARTMENT_OTHER): Payer: BLUE CROSS/BLUE SHIELD

## 2014-06-15 VITALS — BP 140/69 | HR 64 | Temp 97.9°F

## 2014-06-15 DIAGNOSIS — C9000 Multiple myeloma not having achieved remission: Secondary | ICD-10-CM

## 2014-06-15 DIAGNOSIS — Z5112 Encounter for antineoplastic immunotherapy: Secondary | ICD-10-CM

## 2014-06-15 LAB — CBC WITH DIFFERENTIAL/PLATELET
BASO%: 0.1 % (ref 0.0–2.0)
Basophils Absolute: 0 10*3/uL (ref 0.0–0.1)
EOS ABS: 0.1 10*3/uL (ref 0.0–0.5)
EOS%: 1 % (ref 0.0–7.0)
HEMATOCRIT: 34.3 % — AB (ref 34.8–46.6)
HEMOGLOBIN: 11.1 g/dL — AB (ref 11.6–15.9)
LYMPH%: 18.2 % (ref 14.0–49.7)
MCH: 29 pg (ref 25.1–34.0)
MCHC: 32.4 g/dL (ref 31.5–36.0)
MCV: 89.6 fL (ref 79.5–101.0)
MONO#: 0.4 10*3/uL (ref 0.1–0.9)
MONO%: 6.4 % (ref 0.0–14.0)
NEUT#: 5.1 10*3/uL (ref 1.5–6.5)
NEUT%: 74.3 % (ref 38.4–76.8)
Platelets: 168 10*3/uL (ref 145–400)
RBC: 3.83 10*6/uL (ref 3.70–5.45)
RDW: 14.6 % — AB (ref 11.2–14.5)
WBC: 6.9 10*3/uL (ref 3.9–10.3)
lymph#: 1.3 10*3/uL (ref 0.9–3.3)

## 2014-06-15 LAB — COMPREHENSIVE METABOLIC PANEL (CC13)
ALBUMIN: 3.5 g/dL (ref 3.5–5.0)
ALT: 31 U/L (ref 0–55)
AST: 25 U/L (ref 5–34)
Alkaline Phosphatase: 66 U/L (ref 40–150)
Anion Gap: 11 mEq/L (ref 3–11)
BUN: 10.6 mg/dL (ref 7.0–26.0)
CO2: 26 mEq/L (ref 22–29)
CREATININE: 0.7 mg/dL (ref 0.6–1.1)
Calcium: 9.4 mg/dL (ref 8.4–10.4)
Chloride: 103 mEq/L (ref 98–109)
EGFR: >90 mL/min/{1.73_m2} (ref >90)
GLUCOSE: 125 mg/dL (ref 70–140)
Potassium: 3.9 mEq/L (ref 3.5–5.1)
SODIUM: 140 meq/L (ref 136–145)
Total Bilirubin: 0.56 mg/dL (ref 0.20–1.20)
Total Protein: 6.4 g/dL (ref 6.4–8.3)

## 2014-06-15 MED ORDER — BORTEZOMIB CHEMO SQ INJECTION 3.5 MG (2.5MG/ML)
1.3000 mg/m2 | Freq: Once | INTRAMUSCULAR | Status: AC
  Administered 2014-06-15: 3.25 mg via SUBCUTANEOUS
  Filled 2014-06-15: qty 3.25

## 2014-06-15 MED ORDER — ONDANSETRON HCL 8 MG PO TABS
ORAL_TABLET | ORAL | Status: AC
  Filled 2014-06-15: qty 1

## 2014-06-15 MED ORDER — ONDANSETRON HCL 8 MG PO TABS
8.0000 mg | ORAL_TABLET | Freq: Once | ORAL | Status: AC
  Administered 2014-06-15: 8 mg via ORAL

## 2014-06-22 ENCOUNTER — Encounter: Payer: Self-pay | Admitting: Oncology

## 2014-06-22 ENCOUNTER — Ambulatory Visit (HOSPITAL_BASED_OUTPATIENT_CLINIC_OR_DEPARTMENT_OTHER): Payer: BLUE CROSS/BLUE SHIELD

## 2014-06-22 ENCOUNTER — Telehealth: Payer: Self-pay | Admitting: Oncology

## 2014-06-22 ENCOUNTER — Other Ambulatory Visit (HOSPITAL_BASED_OUTPATIENT_CLINIC_OR_DEPARTMENT_OTHER): Payer: BLUE CROSS/BLUE SHIELD

## 2014-06-22 ENCOUNTER — Ambulatory Visit (HOSPITAL_BASED_OUTPATIENT_CLINIC_OR_DEPARTMENT_OTHER): Payer: BLUE CROSS/BLUE SHIELD | Admitting: Oncology

## 2014-06-22 VITALS — BP 146/59 | HR 66 | Temp 97.4°F | Resp 20 | Ht 67.0 in | Wt 260.9 lb

## 2014-06-22 DIAGNOSIS — Z5112 Encounter for antineoplastic immunotherapy: Secondary | ICD-10-CM

## 2014-06-22 DIAGNOSIS — C9000 Multiple myeloma not having achieved remission: Secondary | ICD-10-CM | POA: Diagnosis not present

## 2014-06-22 DIAGNOSIS — G629 Polyneuropathy, unspecified: Secondary | ICD-10-CM | POA: Diagnosis not present

## 2014-06-22 LAB — COMPREHENSIVE METABOLIC PANEL (CC13)
ALT: 34 U/L (ref 0–55)
AST: 25 U/L (ref 5–34)
Albumin: 3.6 g/dL (ref 3.5–5.0)
Alkaline Phosphatase: 63 U/L (ref 40–150)
Anion Gap: 13 mEq/L — ABNORMAL HIGH (ref 3–11)
BUN: 9.2 mg/dL (ref 7.0–26.0)
CALCIUM: 9.4 mg/dL (ref 8.4–10.4)
CHLORIDE: 102 meq/L (ref 98–109)
CO2: 23 mEq/L (ref 22–29)
Creatinine: 0.8 mg/dL (ref 0.6–1.1)
EGFR: 83 mL/min/{1.73_m2} — ABNORMAL LOW (ref >90)
Glucose: 127 mg/dl (ref 70–140)
POTASSIUM: 4.2 meq/L (ref 3.5–5.1)
Sodium: 139 mEq/L (ref 136–145)
TOTAL PROTEIN: 6.5 g/dL (ref 6.4–8.3)
Total Bilirubin: 0.57 mg/dL (ref 0.20–1.20)

## 2014-06-22 LAB — CBC WITH DIFFERENTIAL/PLATELET
BASO%: 0.7 % (ref 0.0–2.0)
BASOS ABS: 0 10*3/uL (ref 0.0–0.1)
EOS ABS: 0.1 10*3/uL (ref 0.0–0.5)
EOS%: 1.3 % (ref 0.0–7.0)
HCT: 33.6 % — ABNORMAL LOW (ref 34.8–46.6)
HGB: 11.1 g/dL — ABNORMAL LOW (ref 11.6–15.9)
LYMPH%: 17.8 % (ref 14.0–49.7)
MCH: 28.8 pg (ref 25.1–34.0)
MCHC: 33 g/dL (ref 31.5–36.0)
MCV: 87.3 fL (ref 79.5–101.0)
MONO#: 0.4 10*3/uL (ref 0.1–0.9)
MONO%: 6.3 % (ref 0.0–14.0)
NEUT#: 4.9 10*3/uL (ref 1.5–6.5)
NEUT%: 73.9 % (ref 38.4–76.8)
Platelets: 175 10*3/uL (ref 145–400)
RBC: 3.85 10*6/uL (ref 3.70–5.45)
RDW: 15.2 % — AB (ref 11.2–14.5)
WBC: 6.6 10*3/uL (ref 3.9–10.3)
lymph#: 1.2 10*3/uL (ref 0.9–3.3)

## 2014-06-22 MED ORDER — ONDANSETRON HCL 8 MG PO TABS
8.0000 mg | ORAL_TABLET | Freq: Once | ORAL | Status: AC
  Administered 2014-06-22: 8 mg via ORAL

## 2014-06-22 MED ORDER — ONDANSETRON HCL 8 MG PO TABS
ORAL_TABLET | ORAL | Status: AC
  Filled 2014-06-22: qty 1

## 2014-06-22 MED ORDER — BORTEZOMIB CHEMO SQ INJECTION 3.5 MG (2.5MG/ML)
1.3000 mg/m2 | Freq: Once | INTRAMUSCULAR | Status: AC
  Administered 2014-06-22: 3.25 mg via SUBCUTANEOUS
  Filled 2014-06-22: qty 3.25

## 2014-06-22 NOTE — Telephone Encounter (Signed)
6.14 pof noted and patient will get a new pof in chemo

## 2014-06-22 NOTE — Patient Instructions (Signed)
[place] Discharge Instructions for Patients Receiving Chemotherapy  Today you received the following chemotherapy agents: Velcade.  To help prevent nausea and vomiting after your treatment, we encourage you to take your nausea medication: compazine 10 mg every 6 hours and zofran as directed   If you develop nausea and vomiting that is not controlled by your nausea medication, call the clinic.   BELOW ARE SYMPTOMS THAT SHOULD BE REPORTED IMMEDIATELY:  *FEVER GREATER THAN 100.5 F  *CHILLS WITH OR WITHOUT FEVER  NAUSEA AND VOMITING THAT IS NOT CONTROLLED WITH YOUR NAUSEA MEDICATION  *UNUSUAL SHORTNESS OF BREATH  *UNUSUAL BRUISING OR BLEEDING  TENDERNESS IN MOUTH AND THROAT WITH OR WITHOUT PRESENCE OF ULCERS  *URINARY PROBLEMS  *BOWEL PROBLEMS  UNUSUAL RASH Items with * indicate a potential emergency and should be followed up as soon as possible.  Feel free to call the clinic you have any questions or concerns. The clinic phone number is [phone].  Please show the [facility] at check-in to the Emergency Department and triage nurse.

## 2014-06-22 NOTE — Progress Notes (Signed)
[place] [phone]   [phone]  OFFICE PROGRESS NOTE  [name], MD [address] [place] [address]  DIAGNOSIS: MGUS diagnosed in September 2010, with additional symptoms suggestive of POEMS syndrome.   CURRENT THERAPY: Velcade 1.3 MG/M2 subcutaneously with Decadron 40 mg by mouth on a weekly basis. First cycle 11/24/2013. She is status post 28 weekly doses of treatment.  INTERVAL HISTORY: Brenda Conner 54 y.o. female returns to the clinic today for followup visit. The patient is feeling fine today with no specific complaints. She is tolerating her current treatment with subcutaneous Velcade and Decadron fairly well except for about 2 days of fatigue after the treatment probably secondary to withdrawal from the Decadron. She is also complaining of some seasonal allergy symptoms. She denied having any significant weight loss or night sweats. The patient denied having any fever or chills. She has no chest pain, shortness of breath, cough or hemoptysis. She denied having any aching pain.   MEDICAL HISTORY: Past Medical History  Diagnosis Date  . PONV (postoperative nausea and vomiting)   . Hypertension   . Anxiety   . Depression   . Asthma   . Sleep apnea     CPAP at bedtime  . Cancer     waldenstroms/ macroglobinulemia  . Hypercholesteremia   . Hypothyroidism   . Depression   . Macroglobulinemia     ? POEMS syndrome  . Acid reflux   . Multiple myeloma   . Diabetes mellitus without complication   . Obesity   . Sleep apnea     ALLERGIES:  is allergic to codeine; hydrocodone; lortab; onion; shellfish allergy; and amoxicillin.  MEDICATIONS:  Current Outpatient Prescriptions  Medication Sig Dispense Refill  . acyclovir (ZOVIRAX) 400 MG tablet Take 1 tablet (400 mg total) by mouth 2 (two) times daily. 60 tablet 3  . ALPRAZolam (XANAX) 0.5 MG tablet Take 0.5 mg by mouth at bedtime as needed for anxiety.     [name] aspirin 81  MG tablet Take 81 mg by mouth daily.    . Azelastine HCl 0.15 % SOLN as needed.    . Blood Glucose Monitoring Suppl (ONE TOUCH ULTRA MINI) W/DEVICE KIT See admin instructions.  0  . cetirizine (ZYRTEC) 10 MG tablet Take 10 mg by mouth at bedtime.     [name] dexamethasone (DECADRON) 4 MG tablet 10 tablet by mouth every week. Start with the first dose of chemotherapy. 40 tablet 3  . fexofenadine (ALLEGRA) 180 MG tablet Take 180 mg by mouth every morning.    . Fluticasone-Salmeterol (ADVAIR) 100-50 MCG/DOSE AEPB Inhale 2 puffs into the lungs every 12 (twelve) hours.    [name] ibuprofen (ADVIL,MOTRIN) 100 MG tablet Take 100 mg by mouth every 6 (six) hours as needed.    [name] levothyroxine (SYNTHROID, LEVOTHROID) 125 MCG tablet Take 125 mcg by mouth daily before breakfast.     . metFORMIN (GLUCOPHAGE) 500 MG tablet Take 1,000 mg by mouth 2 (two) times daily.  3  . NOVOLOG FLEXPEN 100 UNIT/ML FlexPen 3 Units daily as needed.  6  . omeprazole (PRILOSEC) 40 MG capsule Take 40 mg by mouth 2 (two) times daily.     . ondansetron (ZOFRAN-ODT) 8 MG disintegrating tablet Take 8 mg by mouth every 8 (eight) hours as needed.  2  . ONE TOUCH ULTRA TEST test strip See admin instructions.  11  . pravastatin (PRAVACHOL) 40 MG tablet Take 40 mg by mouth every  evening.     . sertraline (ZOLOFT) 50 MG tablet 3 tablets (173m) po daily 90 tablet 12  . terconazole (TERAZOL 7) 0.4 % vaginal cream   0  . UNABLE TO FIND Med Name: Allergy shots    . verapamil (CALAN-SR) 240 MG CR tablet Take 240 mg by mouth 2 (two) times daily.    . calcium-vitamin D 250-100 MG-UNIT per tablet Take 1 tablet by mouth 2 (two) times daily.    . Multiple Vitamins-Minerals (MULTIVITAMIN PO) Take by mouth.    . prochlorperazine (COMPAZINE) 10 MG tablet Take 1 tablet (10 mg total) by mouth every 6 (six) hours as needed for nausea or vomiting. (Patient not taking: Reported on 06/22/2014) 30 tablet 0  . VITAMIN D, CHOLECALCIFEROL, PO Take 4,000 Int'l Units by  mouth daily.     No current facility-administered medications for this visit.    SURGICAL HISTORY:  Past Surgical History  Procedure Laterality Date  . Cholecystectomy    . Back surgery  03/2004    herniation, L4-L5  . Bil laprascopic knee surgery    . Uterine fibroid embolization    . Bone marrow biopsy      2011  . Laparoscopic cholecystectomy  1997  . Foot surgery  1998  . Nasal sinus surgery  2004  . Ablation  09/2007    HTA and polyp resection  . Knee arthroscopy w/ meniscal repair  10/10 ; 3/11  . Bone marrow biopsy  4/14    REVIEW OF SYSTEMS:  Constitutional: positive for fatigue Eyes: negative Ears, nose, mouth, throat, and face: negative Respiratory: negative Cardiovascular: negative Gastrointestinal: negative Genitourinary:negative Integument/breast: negative Hematologic/lymphatic: negative Musculoskeletal:negative Neurological: negative Behavioral/Psych: negative Endocrine: negative Allergic/Immunologic: negative   PHYSICAL EXAMINATION: General appearance: alert, cooperative and no distress Head: Normocephalic, without obvious abnormality, atraumatic Neck: no adenopathy Lymph nodes: Cervical, supraclavicular, and axillary nodes normal. Resp: clear to auscultation bilaterally Back: symmetric, no curvature. ROM normal. No CVA tenderness. Cardio: regular rate and rhythm, S1, S2 normal, no murmur, click, rub or gallop GI: soft, non-tender; bowel sounds normal; no masses,  no organomegaly Extremities: extremities normal, atraumatic, no cyanosis or edema Neurologic: Alert and oriented X 3, normal strength and tone. Normal symmetric reflexes. Normal coordination and gait  ECOG PERFORMANCE STATUS: 0 - Asymptomatic  Blood pressure 146/59, pulse 66, temperature 97.4 F (36.3 C), temperature source Oral, resp. rate 20, height _0  (1.702 m), weight 260 lb 14.4 oz (118.343 kg), SpO2 99 %.  LABORATORY DATA: Lab Results  Component Value Date   WBC 6.6 06/22/2014     HGB 11.1* 06/22/2014   HCT 33.6* 06/22/2014   MCV 87.3 06/22/2014   PLT 175 06/22/2014      Chemistry      Component Value Date/Time   NA 139 06/22/2014 0809   NA 137 03/20/2011 1509   K 4.2 06/22/2014 0809   K 3.4* 03/20/2011 1509   CL 104 04/07/2012 1415   CL 102 03/20/2011 1509   CO2 23 06/22/2014 0809   CO2 26 03/20/2011 1509   BUN 9.2 06/22/2014 0809   BUN 7 03/20/2011 1509   CREATININE 0.8 06/22/2014 0809   CREATININE 0.63 03/20/2011 1509      Component Value Date/Time   CALCIUM 9.4 06/22/2014 0809   CALCIUM 9.2 03/20/2011 1509   ALKPHOS 63 06/22/2014 0809   ALKPHOS 74 03/20/2011 1509   AST 25 06/22/2014 0809   AST 24 03/20/2011 1509   ALT 34 06/22/2014 0809   ALT  26 03/20/2011 1509   BILITOT 0.57 06/22/2014 0809   BILITOT 0.3 03/20/2011 1509     Other lab results: Beta-2 microglobulin 2.00, free kappa light chain 0.4, free lambda light chain 53.20 with a kappa/lambda ratio of 0.01. IgG 332, IgA 68 and IgM 547.  ASSESSMENT AND PLAN: This is a very pleasant 54 year old white female with a multiple myeloma initially diagnosed as monoclonal gammopathy in addition to peripheral neuropathy, and Endocrinopathy in the form of hypothyroidism and skin rash. The symptoms combined are suspicious for POEMS syndrome.  She is currently on treatment with subcutaneous tenderness Velcade and Decadron status post 28 cycles and tolerated her treatment fairly well. The recent myeloma panel showed further improvement in her disease.  The patient was discussed with and also seen by Dr.Mohamed. She will continue with her current treatment as scheduled. She will follow up in 3 weeks for another symptom management visit. Recheck myeloma panel in 2 weeks so that we can discuss the results at her next visit. Once we have her lab results we can consider a break from her chemotherapy.  She was advised to call immediately if her symptoms started getting worse as I may consider the patient for  treatment with multiple myeloma regimen. The patient agreed to the current plan.  All questions were answered. The patient knows to call the clinic with any problems, questions or concerns. We can certainly see the patient much sooner if necessary.  Mikey Bussing, DNP, AGPCNP-BC, AOCNP  ADDENDUM: Hematology/Oncology Attending: I had a face to face encounter with the patient. I recommended her care plan. This is a very pleasant 54 years old white female with plasma cell dyscrasia currently undergoing treatment with weekly subcutaneous Velcade and Decadron status post 28 cycles. The patient is tolerating her treatment fairly well with no significant adverse effects except for mild fatigue. I recommended for her to continue her treatment with subcutaneous Velcade and Decadron as a scheduled. She will come back for follow-up visit in 3 weeks for reevaluation after repeating myeloma panel for reevaluation of her disease. She was advised to call immediately if she has any concerning symptoms in the interval.  Disclaimer: This note was dictated with voice recognition software. Similar sounding words can inadvertently be transcribed and may not be corrected upon review. Eilleen Kempf., MD 06/23/2014

## 2014-06-29 ENCOUNTER — Ambulatory Visit (HOSPITAL_BASED_OUTPATIENT_CLINIC_OR_DEPARTMENT_OTHER): Payer: BLUE CROSS/BLUE SHIELD

## 2014-06-29 ENCOUNTER — Other Ambulatory Visit (HOSPITAL_BASED_OUTPATIENT_CLINIC_OR_DEPARTMENT_OTHER): Payer: BLUE CROSS/BLUE SHIELD

## 2014-06-29 VITALS — BP 133/67 | HR 59 | Temp 97.8°F | Resp 18

## 2014-06-29 DIAGNOSIS — Z5112 Encounter for antineoplastic immunotherapy: Secondary | ICD-10-CM

## 2014-06-29 DIAGNOSIS — C9000 Multiple myeloma not having achieved remission: Secondary | ICD-10-CM

## 2014-06-29 LAB — CBC WITH DIFFERENTIAL/PLATELET
BASO%: 0.7 % (ref 0.0–2.0)
BASOS ABS: 0 10*3/uL (ref 0.0–0.1)
EOS%: 1.4 % (ref 0.0–7.0)
Eosinophils Absolute: 0.1 10*3/uL (ref 0.0–0.5)
HCT: 34 % — ABNORMAL LOW (ref 34.8–46.6)
HEMOGLOBIN: 11.3 g/dL — AB (ref 11.6–15.9)
LYMPH%: 19.9 % (ref 14.0–49.7)
MCH: 29 pg (ref 25.1–34.0)
MCHC: 33.1 g/dL (ref 31.5–36.0)
MCV: 87.5 fL (ref 79.5–101.0)
MONO#: 0.4 10*3/uL (ref 0.1–0.9)
MONO%: 6.7 % (ref 0.0–14.0)
NEUT#: 4.4 10*3/uL (ref 1.5–6.5)
NEUT%: 71.3 % (ref 38.4–76.8)
Platelets: 156 10*3/uL (ref 145–400)
RBC: 3.89 10*6/uL (ref 3.70–5.45)
RDW: 15 % — ABNORMAL HIGH (ref 11.2–14.5)
WBC: 6.2 10*3/uL (ref 3.9–10.3)
lymph#: 1.2 10*3/uL (ref 0.9–3.3)

## 2014-06-29 LAB — COMPREHENSIVE METABOLIC PANEL (CC13)
ALK PHOS: 61 U/L (ref 40–150)
ALT: 32 U/L (ref 0–55)
AST: 26 U/L (ref 5–34)
Albumin: 3.6 g/dL (ref 3.5–5.0)
Anion Gap: 10 mEq/L (ref 3–11)
BUN: 14.7 mg/dL (ref 7.0–26.0)
CO2: 26 mEq/L (ref 22–29)
Calcium: 9.1 mg/dL (ref 8.4–10.4)
Chloride: 103 mEq/L (ref 98–109)
Creatinine: 0.7 mg/dL (ref 0.6–1.1)
EGFR: >90 mL/min/{1.73_m2} (ref >90)
Glucose: 116 mg/dl (ref 70–140)
Potassium: 3.9 mEq/L (ref 3.5–5.1)
Sodium: 138 mEq/L (ref 136–145)
TOTAL PROTEIN: 6.2 g/dL — AB (ref 6.4–8.3)
Total Bilirubin: 0.48 mg/dL (ref 0.20–1.20)

## 2014-06-29 MED ORDER — ONDANSETRON HCL 8 MG PO TABS
ORAL_TABLET | ORAL | Status: AC
  Filled 2014-06-29: qty 1

## 2014-06-29 MED ORDER — ONDANSETRON HCL 8 MG PO TABS
8.0000 mg | ORAL_TABLET | Freq: Once | ORAL | Status: AC
  Administered 2014-06-29: 8 mg via ORAL

## 2014-06-29 MED ORDER — BORTEZOMIB CHEMO SQ INJECTION 3.5 MG (2.5MG/ML)
1.3000 mg/m2 | Freq: Once | INTRAMUSCULAR | Status: AC
  Administered 2014-06-29: 3.25 mg via SUBCUTANEOUS
  Filled 2014-06-29: qty 3.25

## 2014-06-29 NOTE — Patient Instructions (Signed)
Bortezomib injection What is this medicine? BORTEZOMIB (bor TEZ oh mib) is a chemotherapy drug. It slows the growth of cancer cells. This medicine is used to treat multiple myeloma, and certain lymphomas, such as mantle-cell lymphoma. This medicine may be used for other purposes; ask your health care provider or pharmacist if you have questions. COMMON BRAND NAME(S): Velcade What should I tell my health care provider before I take this medicine? They need to know if you have any of these conditions: -diabetes -heart disease -irregular heartbeat -liver disease -on hemodialysis -low blood counts, like low white blood cells, platelets, or hemoglobin -peripheral neuropathy -taking medicine for blood pressure -an unusual or allergic reaction to bortezomib, mannitol, boron, other medicines, foods, dyes, or preservatives -pregnant or trying to get pregnant -breast-feeding How should I use this medicine? This medicine is for injection into a vein or for injection under the skin. It is given by a health care professional in a hospital or clinic setting. Talk to your pediatrician regarding the use of this medicine in children. Special care may be needed. Overdosage: If you think you have taken too much of this medicine contact a poison control center or emergency room at once. NOTE: This medicine is only for you. Do not share this medicine with others. What if I miss a dose? It is important not to miss your dose. Call your doctor or health care professional if you are unable to keep an appointment. What may interact with this medicine? This medicine may interact with the following medications: -ketoconazole -rifampin -ritonavir -St. John's Wort This list may not describe all possible interactions. Give your health care provider a list of all the medicines, herbs, non-prescription drugs, or dietary supplements you use. Also tell them if you smoke, drink alcohol, or use illegal drugs. Some items  may interact with your medicine. What should I watch for while using this medicine? Visit your doctor for checks on your progress. This drug may make you feel generally unwell. This is not uncommon, as chemotherapy can affect healthy cells as well as cancer cells. Report any side effects. Continue your course of treatment even though you feel ill unless your doctor tells you to stop. You may get drowsy or dizzy. Do not drive, use machinery, or do anything that needs mental alertness until you know how this medicine affects you. Do not stand or sit up quickly, especially if you are an older patient. This reduces the risk of dizzy or fainting spells. In some cases, you may be given additional medicines to help with side effects. Follow all directions for their use. Call your doctor or health care professional for advice if you get a fever, chills or sore throat, or other symptoms of a cold or flu. Do not treat yourself. This drug decreases your body's ability to fight infections. Try to avoid being around people who are sick. This medicine may increase your risk to bruise or bleed. Call your doctor or health care professional if you notice any unusual bleeding. You may need blood work done while you are taking this medicine. In some patients, this medicine may cause a serious brain infection that may cause death. If you have any problems seeing, thinking, speaking, walking, or standing, tell your doctor right away. If you cannot reach your doctor, urgently seek other source of medical care. Do not become pregnant while taking this medicine. Women should inform their doctor if they wish to become pregnant or think they might be pregnant. There is   a potential for serious side effects to an unborn child. Talk to your health care professional or pharmacist for more information. Do not breast-feed an infant while taking this medicine. Check with your doctor or health care professional if you get an attack of  severe diarrhea, nausea and vomiting, or if you sweat a lot. The loss of too much body fluid can make it dangerous for you to take this medicine. What side effects may I notice from receiving this medicine? Side effects that you should report to your doctor or health care professional as soon as possible: -allergic reactions like skin rash, itching or hives, swelling of the face, lips, or tongue -breathing problems -changes in hearing -changes in vision -fast, irregular heartbeat -feeling faint or lightheaded, falls -pain, tingling, numbness in the hands or feet -right upper belly pain -seizures -swelling of the ankles, feet, hands -unusual bleeding or bruising -unusually weak or tired -vomiting -yellowing of the eyes or skin Side effects that usually do not require medical attention (report to your doctor or health care professional if they continue or are bothersome): -changes in emotions or moods -constipation -diarrhea -loss of appetite -headache -irritation at site where injected -nausea This list may not describe all possible side effects. Call your doctor for medical advice about side effects. You may report side effects to FDA at 1-800-FDA-1088. Where should I keep my medicine? This drug is given in a hospital or clinic and will not be stored at home. NOTE: This sheet is a summary. It may not cover all possible information. If you have questions about this medicine, talk to your doctor, pharmacist, or health care provider.  2015, [employer]vier/[employer] Standard. (2012-10-20 12:46:32)  

## 2014-07-05 ENCOUNTER — Other Ambulatory Visit: Payer: Self-pay

## 2014-07-06 ENCOUNTER — Other Ambulatory Visit (HOSPITAL_BASED_OUTPATIENT_CLINIC_OR_DEPARTMENT_OTHER): Payer: BLUE CROSS/BLUE SHIELD

## 2014-07-06 ENCOUNTER — Ambulatory Visit (HOSPITAL_BASED_OUTPATIENT_CLINIC_OR_DEPARTMENT_OTHER): Payer: BLUE CROSS/BLUE SHIELD

## 2014-07-06 VITALS — BP 144/69 | HR 69 | Temp 97.9°F | Resp 22

## 2014-07-06 DIAGNOSIS — Z5112 Encounter for antineoplastic immunotherapy: Secondary | ICD-10-CM | POA: Diagnosis not present

## 2014-07-06 DIAGNOSIS — C9000 Multiple myeloma not having achieved remission: Secondary | ICD-10-CM

## 2014-07-06 LAB — COMPREHENSIVE METABOLIC PANEL (CC13)
ALK PHOS: 63 U/L (ref 40–150)
ALT: 37 U/L (ref 0–55)
AST: 27 U/L (ref 5–34)
Albumin: 3.7 g/dL (ref 3.5–5.0)
Anion Gap: 9 mEq/L (ref 3–11)
BILIRUBIN TOTAL: 0.67 mg/dL (ref 0.20–1.20)
BUN: 12 mg/dL (ref 7.0–26.0)
CO2: 26 mEq/L (ref 22–29)
CREATININE: 0.7 mg/dL (ref 0.6–1.1)
Calcium: 9.3 mg/dL (ref 8.4–10.4)
Chloride: 101 mEq/L (ref 98–109)
EGFR: >90 mL/min/{1.73_m2} (ref >90)
Glucose: 116 mg/dl (ref 70–140)
Potassium: 3.8 mEq/L (ref 3.5–5.1)
Sodium: 136 mEq/L (ref 136–145)
Total Protein: 6.4 g/dL (ref 6.4–8.3)

## 2014-07-06 LAB — CBC WITH DIFFERENTIAL/PLATELET
BASO%: 0.8 % (ref 0.0–2.0)
Basophils Absolute: 0 10*3/uL (ref 0.0–0.1)
EOS%: 1.2 % (ref 0.0–7.0)
Eosinophils Absolute: 0.1 10*3/uL (ref 0.0–0.5)
HCT: 32.7 % — ABNORMAL LOW (ref 34.8–46.6)
HGB: 10.9 g/dL — ABNORMAL LOW (ref 11.6–15.9)
LYMPH#: 1.2 10*3/uL (ref 0.9–3.3)
LYMPH%: 19.3 % (ref 14.0–49.7)
MCH: 29 pg (ref 25.1–34.0)
MCHC: 33.4 g/dL (ref 31.5–36.0)
MCV: 87 fL (ref 79.5–101.0)
MONO#: 0.4 10*3/uL (ref 0.1–0.9)
MONO%: 6.8 % (ref 0.0–14.0)
NEUT#: 4.4 10*3/uL (ref 1.5–6.5)
NEUT%: 71.9 % (ref 38.4–76.8)
Platelets: 154 10*3/uL (ref 145–400)
RBC: 3.76 10*6/uL (ref 3.70–5.45)
RDW: 15.7 % — ABNORMAL HIGH (ref 11.2–14.5)
WBC: 6.2 10*3/uL (ref 3.9–10.3)

## 2014-07-06 MED ORDER — BORTEZOMIB CHEMO SQ INJECTION 3.5 MG (2.5MG/ML)
1.3000 mg/m2 | Freq: Once | INTRAMUSCULAR | Status: AC
  Administered 2014-07-06: 3.25 mg via SUBCUTANEOUS
  Filled 2014-07-06: qty 3.25

## 2014-07-06 MED ORDER — ONDANSETRON HCL 8 MG PO TABS
8.0000 mg | ORAL_TABLET | Freq: Once | ORAL | Status: AC
  Administered 2014-07-06: 8 mg via ORAL

## 2014-07-06 MED ORDER — ONDANSETRON HCL 8 MG PO TABS
ORAL_TABLET | ORAL | Status: AC
  Filled 2014-07-06: qty 1

## 2014-07-06 NOTE — Patient Instructions (Signed)
Bortezomib injection What is this medicine? BORTEZOMIB (bor TEZ oh mib) is a chemotherapy drug. It slows the growth of cancer cells. This medicine is used to treat multiple myeloma, and certain lymphomas, such as mantle-cell lymphoma. This medicine may be used for other purposes; ask your health care provider or pharmacist if you have questions. COMMON BRAND NAME(S): Velcade What should I tell my health care provider before I take this medicine? They need to know if you have any of these conditions: -diabetes -heart disease -irregular heartbeat -liver disease -on hemodialysis -low blood counts, like low white blood cells, platelets, or hemoglobin -peripheral neuropathy -taking medicine for blood pressure -an unusual or allergic reaction to bortezomib, mannitol, boron, other medicines, foods, dyes, or preservatives -pregnant or trying to get pregnant -breast-feeding How should I use this medicine? This medicine is for injection into a vein or for injection under the skin. It is given by a health care professional in a hospital or clinic setting. Talk to your pediatrician regarding the use of this medicine in children. Special care may be needed. Overdosage: If you think you have taken too much of this medicine contact a poison control center or emergency room at once. NOTE: This medicine is only for you. Do not share this medicine with others. What if I miss a dose? It is important not to miss your dose. Call your doctor or health care professional if you are unable to keep an appointment. What may interact with this medicine? This medicine may interact with the following medications: -ketoconazole -rifampin -ritonavir -St. John's Wort This list may not describe all possible interactions. Give your health care provider a list of all the medicines, herbs, non-prescription drugs, or dietary supplements you use. Also tell them if you smoke, drink alcohol, or use illegal drugs. Some items  may interact with your medicine. What should I watch for while using this medicine? Visit your doctor for checks on your progress. This drug may make you feel generally unwell. This is not uncommon, as chemotherapy can affect healthy cells as well as cancer cells. Report any side effects. Continue your course of treatment even though you feel ill unless your doctor tells you to stop. You may get drowsy or dizzy. Do not drive, use machinery, or do anything that needs mental alertness until you know how this medicine affects you. Do not stand or sit up quickly, especially if you are an older patient. This reduces the risk of dizzy or fainting spells. In some cases, you may be given additional medicines to help with side effects. Follow all directions for their use. Call your doctor or health care professional for advice if you get a fever, chills or sore throat, or other symptoms of a cold or flu. Do not treat yourself. This drug decreases your body's ability to fight infections. Try to avoid being around people who are sick. This medicine may increase your risk to bruise or bleed. Call your doctor or health care professional if you notice any unusual bleeding. You may need blood work done while you are taking this medicine. In some patients, this medicine may cause a serious brain infection that may cause death. If you have any problems seeing, thinking, speaking, walking, or standing, tell your doctor right away. If you cannot reach your doctor, urgently seek other source of medical care. Do not become pregnant while taking this medicine. Women should inform their doctor if they wish to become pregnant or think they might be pregnant. There is   a potential for serious side effects to an unborn child. Talk to your health care professional or pharmacist for more information. Do not breast-feed an infant while taking this medicine. Check with your doctor or health care professional if you get an attack of  severe diarrhea, nausea and vomiting, or if you sweat a lot. The loss of too much body fluid can make it dangerous for you to take this medicine. What side effects may I notice from receiving this medicine? Side effects that you should report to your doctor or health care professional as soon as possible: -allergic reactions like skin rash, itching or hives, swelling of the face, lips, or tongue -breathing problems -changes in hearing -changes in vision -fast, irregular heartbeat -feeling faint or lightheaded, falls -pain, tingling, numbness in the hands or feet -right upper belly pain -seizures -swelling of the ankles, feet, hands -unusual bleeding or bruising -unusually weak or tired -vomiting -yellowing of the eyes or skin Side effects that usually do not require medical attention (report to your doctor or health care professional if they continue or are bothersome): -changes in emotions or moods -constipation -diarrhea -loss of appetite -headache -irritation at site where injected -nausea This list may not describe all possible side effects. Call your doctor for medical advice about side effects. You may report side effects to FDA at 1-800-FDA-1088. Where should I keep my medicine? This drug is given in a hospital or clinic and will not be stored at home. NOTE: This sheet is a summary. It may not cover all possible information. If you have questions about this medicine, talk to your doctor, pharmacist, or health care provider.  2015, [employer]vier/[employer] Standard. (2012-10-20 12:46:32)  

## 2014-07-08 LAB — IGG, IGA, IGM
IGG (IMMUNOGLOBIN G), SERUM: 342 mg/dL — AB (ref 690–1700)
IgA: 68 mg/dL — ABNORMAL LOW (ref 69–380)
IgM, Serum: 518 mg/dL — ABNORMAL HIGH (ref 52–322)

## 2014-07-08 LAB — KAPPA/LAMBDA LIGHT CHAINS
KAPPA LAMBDA RATIO: 0.02 — AB (ref 0.26–1.65)
Kappa free light chain: 1.05 mg/dL (ref 0.33–1.94)
Lambda Free Lght Chn: 53.5 mg/dL — ABNORMAL HIGH (ref 0.57–2.63)

## 2014-07-08 LAB — BETA 2 MICROGLOBULIN, SERUM: Beta-2 Microglobulin: 1.9 mg/L (ref <=2.51)

## 2014-07-13 ENCOUNTER — Ambulatory Visit (HOSPITAL_BASED_OUTPATIENT_CLINIC_OR_DEPARTMENT_OTHER): Payer: BLUE CROSS/BLUE SHIELD | Admitting: Oncology

## 2014-07-13 ENCOUNTER — Other Ambulatory Visit (HOSPITAL_BASED_OUTPATIENT_CLINIC_OR_DEPARTMENT_OTHER): Payer: BLUE CROSS/BLUE SHIELD

## 2014-07-13 ENCOUNTER — Encounter: Payer: Self-pay | Admitting: Oncology

## 2014-07-13 ENCOUNTER — Telehealth: Payer: Self-pay | Admitting: Internal Medicine

## 2014-07-13 ENCOUNTER — Other Ambulatory Visit: Payer: BLUE CROSS/BLUE SHIELD

## 2014-07-13 ENCOUNTER — Ambulatory Visit: Payer: BLUE CROSS/BLUE SHIELD

## 2014-07-13 ENCOUNTER — Ambulatory Visit: Payer: BLUE CROSS/BLUE SHIELD | Admitting: Oncology

## 2014-07-13 VITALS — BP 142/65 | HR 69 | Temp 98.2°F | Resp 19 | Ht 67.0 in | Wt 263.7 lb

## 2014-07-13 DIAGNOSIS — C9000 Multiple myeloma not having achieved remission: Secondary | ICD-10-CM

## 2014-07-13 LAB — CBC WITH DIFFERENTIAL/PLATELET
BASO%: 0.4 % (ref 0.0–2.0)
Basophils Absolute: 0 10*3/uL (ref 0.0–0.1)
EOS%: 0.9 % (ref 0.0–7.0)
Eosinophils Absolute: 0.1 10*3/uL (ref 0.0–0.5)
HCT: 32.5 % — ABNORMAL LOW (ref 34.8–46.6)
HGB: 10.8 g/dL — ABNORMAL LOW (ref 11.6–15.9)
LYMPH%: 13.3 % — ABNORMAL LOW (ref 14.0–49.7)
MCH: 29.2 pg (ref 25.1–34.0)
MCHC: 33.2 g/dL (ref 31.5–36.0)
MCV: 88.2 fL (ref 79.5–101.0)
MONO#: 0.3 10*3/uL (ref 0.1–0.9)
MONO%: 4.9 % (ref 0.0–14.0)
NEUT#: 5 10*3/uL (ref 1.5–6.5)
NEUT%: 80.5 % — ABNORMAL HIGH (ref 38.4–76.8)
Platelets: 155 10*3/uL (ref 145–400)
RBC: 3.68 10*6/uL — AB (ref 3.70–5.45)
RDW: 15.5 % — AB (ref 11.2–14.5)
WBC: 6.2 10*3/uL (ref 3.9–10.3)
lymph#: 0.8 10*3/uL — ABNORMAL LOW (ref 0.9–3.3)

## 2014-07-13 LAB — COMPREHENSIVE METABOLIC PANEL (CC13)
ALT: 38 U/L (ref 0–55)
AST: 28 U/L (ref 5–34)
Albumin: 3.6 g/dL (ref 3.5–5.0)
Alkaline Phosphatase: 68 U/L (ref 40–150)
Anion Gap: 11 mEq/L (ref 3–11)
BUN: 7.9 mg/dL (ref 7.0–26.0)
CHLORIDE: 103 meq/L (ref 98–109)
CO2: 22 mEq/L (ref 22–29)
CREATININE: 0.7 mg/dL (ref 0.6–1.1)
Calcium: 9.4 mg/dL (ref 8.4–10.4)
EGFR: >90 mL/min/{1.73_m2} (ref >90)
Glucose: 212 mg/dl — ABNORMAL HIGH (ref 70–140)
Potassium: 4 mEq/L (ref 3.5–5.1)
Sodium: 136 mEq/L (ref 136–145)
Total Bilirubin: 0.4 mg/dL (ref 0.20–1.20)
Total Protein: 6.3 g/dL — ABNORMAL LOW (ref 6.4–8.3)

## 2014-07-13 NOTE — Telephone Encounter (Signed)
Gave and pritned appt sched and avs for pt for OCT

## 2014-07-13 NOTE — Progress Notes (Signed)
[facility] [phone]   Fax:(336) [phone]  OFFICE PROGRESS NOTE  [name], MD [place] [address]  DIAGNOSIS: MGUS diagnosed in September 2010, with additional symptoms suggestive of POEMS syndrome.   CURRENT THERAPY: Velcade 1.3 MG/M2 subcutaneously with Decadron 40 mg by mouth on a weekly basis. First cycle 11/24/2013. She is status post 31 weekly doses of treatment.  INTERVAL HISTORY: Brenda Conner 54 y.o. female returns to the clinic today for followup visit. The patient is feeling fine today with no specific complaints. She is tolerating her current treatment with subcutaneous Velcade and Decadron fairly well except for about 2 days of fatigue after the treatment probably secondary to withdrawal from the Decadron. She denied having any significant weight loss or night sweats. The patient denied having any fever or chills. She has no chest pain, shortness of breath, cough or hemoptysis. She denied having any aching pain.   MEDICAL HISTORY: Past Medical History  Diagnosis Date  . PONV (postoperative nausea and vomiting)   . Hypertension   . Anxiety   . Depression   . Asthma   . Sleep apnea     CPAP at bedtime  . Cancer     waldenstroms/ macroglobinulemia  . Hypercholesteremia   . Hypothyroidism   . Depression   . Macroglobulinemia     ? POEMS syndrome  . Acid reflux   . Multiple myeloma   . Diabetes mellitus without complication   . Obesity   . Sleep apnea     ALLERGIES:  is allergic to codeine; hydrocodone; lortab; onion; shellfish allergy; and amoxicillin.  MEDICATIONS:  Current Outpatient Prescriptions  Medication Sig Dispense Refill  . acyclovir (ZOVIRAX) 400 MG tablet Take 1 tablet (400 mg total) by mouth 2 (two) times daily. 60 tablet 3  . ALPRAZolam (XANAX) 0.5 MG tablet Take 0.5 mg by mouth at bedtime as needed for anxiety.     [name] aspirin 81 MG tablet Take 81 mg by mouth daily.    . Azelastine HCl  0.15 % SOLN as needed.    . Blood Glucose Monitoring Suppl (ONE TOUCH ULTRA MINI) W/DEVICE KIT See admin instructions.  0  . cetirizine (ZYRTEC) 10 MG tablet Take 10 mg by mouth at bedtime.     [name] dexamethasone (DECADRON) 4 MG tablet 10 tablet by mouth every week. Start with the first dose of chemotherapy. 40 tablet 3  . fexofenadine (ALLEGRA) 180 MG tablet Take 180 mg by mouth every morning.    . Fluticasone-Salmeterol (ADVAIR) 100-50 MCG/DOSE AEPB Inhale 2 puffs into the lungs every 12 (twelve) hours.    [name] ibuprofen (ADVIL,MOTRIN) 100 MG tablet Take 100 mg by mouth every 6 (six) hours as needed.    . insulin glargine (LANTUS) 100 UNIT/ML injection Inject 8 Units into the skin 2 (two) times a week.    . levothyroxine (SYNTHROID, LEVOTHROID) 125 MCG tablet Take 125 mcg by mouth daily before breakfast.     . metFORMIN (GLUCOPHAGE) 500 MG tablet Take 1,000 mg by mouth 2 (two) times daily.  3  . NOVOLOG FLEXPEN 100 UNIT/ML FlexPen 3 Units daily as needed.  6  . omeprazole (PRILOSEC) 40 MG capsule Take 40 mg by mouth 2 (two) times daily.     . ondansetron (ZOFRAN-ODT) 8 MG disintegrating tablet Take 8 mg by mouth every 8 (eight) hours as needed.  2  . ONE TOUCH ULTRA TEST test strip See admin instructions.  11  .  pravastatin (PRAVACHOL) 40 MG tablet Take 40 mg by mouth every evening.     . sertraline (ZOLOFT) 50 MG tablet 3 tablets (168m) po daily 90 tablet 12  . terconazole (TERAZOL 7) 0.4 % vaginal cream   0  . UNABLE TO FIND Med Name: Allergy shots    . verapamil (CALAN-SR) 240 MG CR tablet Take 240 mg by mouth 2 (two) times daily.    . prochlorperazine (COMPAZINE) 10 MG tablet Take 1 tablet (10 mg total) by mouth every 6 (six) hours as needed for nausea or vomiting. (Patient not taking: Reported on 07/13/2014) 30 tablet 0   No current facility-administered medications for this visit.    SURGICAL HISTORY:  Past Surgical History  Procedure Laterality Date  . Cholecystectomy    . Back surgery   03/2004    herniation, L4-L5  . Bil laprascopic knee surgery    . Uterine fibroid embolization    . Bone marrow biopsy      2011  . Laparoscopic cholecystectomy  1997  . Foot surgery  1998  . Nasal sinus surgery  2004  . Ablation  09/2007    HTA and polyp resection  . Knee arthroscopy w/ meniscal repair  10/10 ; 3/11  . Bone marrow biopsy  4/14    REVIEW OF SYSTEMS:  Constitutional: positive for fatigue Eyes: negative Ears, nose, mouth, throat, and face: negative Respiratory: negative Cardiovascular: negative Gastrointestinal: negative Genitourinary:negative Integument/breast: negative Hematologic/lymphatic: negative Musculoskeletal:negative Neurological: negative Behavioral/Psych: negative Endocrine: negative Allergic/Immunologic: negative   PHYSICAL EXAMINATION: General appearance: alert, cooperative and no distress Head: Normocephalic, without obvious abnormality, atraumatic Neck: no adenopathy Lymph nodes: Cervical, supraclavicular, and axillary nodes normal. Resp: clear to auscultation bilaterally Back: symmetric, no curvature. ROM normal. No CVA tenderness. Cardio: regular rate and rhythm, S1, S2 normal, no murmur, click, rub or gallop GI: soft, non-tender; bowel sounds normal; no masses,  no organomegaly Extremities: extremities normal, atraumatic, no cyanosis or edema Neurologic: Alert and oriented X 3, normal strength and tone. Normal symmetric reflexes. Normal coordination and gait  ECOG PERFORMANCE STATUS: 0 - Asymptomatic  Blood pressure 142/65, pulse 69, temperature 98.2 F (36.8 C), temperature source Oral, resp. rate 19, height 5' 7"  (1.702 m), weight 263 lb 11.2 oz (119.614 kg), SpO2 99 %.  LABORATORY DATA: Lab Results  Component Value Date   WBC 6.2 07/13/2014   HGB 10.8* 07/13/2014   HCT 32.5* 07/13/2014   MCV 88.2 07/13/2014   PLT 155 07/13/2014      Chemistry      Component Value Date/Time   NA 136 07/13/2014 1002   NA 137 03/20/2011 1509     K 4.0 07/13/2014 1002   K 3.4* 03/20/2011 1509   CL 104 04/07/2012 1415   CL 102 03/20/2011 1509   CO2 22 07/13/2014 1002   CO2 26 03/20/2011 1509   BUN 7.9 07/13/2014 1002   BUN 7 03/20/2011 1509   CREATININE 0.7 07/13/2014 1002   CREATININE 0.63 03/20/2011 1509      Component Value Date/Time   CALCIUM 9.4 07/13/2014 1002   CALCIUM 9.2 03/20/2011 1509   ALKPHOS 68 07/13/2014 1002   ALKPHOS 74 03/20/2011 1509   AST 28 07/13/2014 1002   AST 24 03/20/2011 1509   ALT 38 07/13/2014 1002   ALT 26 03/20/2011 1509   BILITOT 0.40 07/13/2014 1002   BILITOT 0.3 03/20/2011 1509     Other lab results 07/06/2014: Beta-2 microglobulin 1.90, free kappa light chain 1.05, free lambda light  chain 53.50 with a kappa/lambda ratio of 0.02. IgG 342, IgA 68 and IgM 518.  ASSESSMENT AND PLAN: This is a very pleasant 54 year old white female with a multiple myeloma initially diagnosed as monoclonal gammopathy in addition to peripheral neuropathy, and Endocrinopathy in the form of hypothyroidism and skin rash. The symptoms combined are suspicious for POEMS syndrome.  She is currently on treatment with subcutaneous tenderness Velcade and Decadron status post 231 cycles and tolerated her treatment fairly well. The recent myeloma panel showed stable disease.  The patient was discussed with and also seen by Dr.Mohamed. Recent myeloma panel results were discussed with the patient. Discussed that her protein levels are stable and have started to plateau. Recommend that she take a break from chemotherapy for approximately 3 months. Recheck myeloma panel in about 3 months. She will be seen for follow-up approximate 1 week after her labs are drawn to discuss the results. The patient agreed to the current plan.  All questions were answered. The patient knows to call the clinic with any problems, questions or concerns. We can certainly see the patient much sooner if necessary.  Mikey Bussing, DNP, AGPCNP-BC,  AOCNP  ADDENDUM: Hematology/Oncology Attending: I had a face to face encounter with the patient. I recommended her care plan. This is a very pleasant 54 years old white female with plasma cell dyscrasia who is currently undergoing treatment with subcutaneous Velcade as well as Decadron status post 31 cycles. Her recent myeloma panel showed stable disease. I discussed the lab result with the patient and give her the option of continuing treatment with the same regimen versus taking a break off chemotherapy for now. The patient is interested in taking a break off the treatment. I would see her back for follow-up visit in 3 months for reevaluation with repeat myeloma panel. She was advised to call immediately if she has any concerning symptoms in the interval.  Disclaimer: This note was dictated with voice recognition software. Similar sounding words can inadvertently be transcribed and may not be corrected upon review. Eilleen Kempf., MD 07/17/2014

## 2014-07-20 ENCOUNTER — Ambulatory Visit: Payer: BLUE CROSS/BLUE SHIELD

## 2014-07-27 ENCOUNTER — Ambulatory Visit: Payer: BLUE CROSS/BLUE SHIELD

## 2014-08-03 ENCOUNTER — Ambulatory Visit: Payer: BLUE CROSS/BLUE SHIELD

## 2014-08-16 ENCOUNTER — Other Ambulatory Visit: Payer: Self-pay | Admitting: Obstetrics & Gynecology

## 2014-08-16 NOTE — Telephone Encounter (Signed)
Medication refill request: Zoloft  Last AEX:  12/21/2013 SM Next AEX: 02/18/15 SM Last MMG (if hormonal medication request): Refill authorized: Please advise.

## 2014-09-17 ENCOUNTER — Telehealth: Payer: Self-pay | Admitting: Internal Medicine

## 2014-09-17 NOTE — Telephone Encounter (Signed)
returned call and s.w. pt and r/s appt per pt request....pt ok and aware of new d.t °

## 2014-10-06 ENCOUNTER — Other Ambulatory Visit (HOSPITAL_BASED_OUTPATIENT_CLINIC_OR_DEPARTMENT_OTHER): Payer: BLUE CROSS/BLUE SHIELD

## 2014-10-06 DIAGNOSIS — C9000 Multiple myeloma not having achieved remission: Secondary | ICD-10-CM

## 2014-10-06 LAB — COMPREHENSIVE METABOLIC PANEL (CC13)
ALBUMIN: 3.7 g/dL (ref 3.5–5.0)
ALT: 43 U/L (ref 0–55)
AST: 42 U/L — ABNORMAL HIGH (ref 5–34)
Alkaline Phosphatase: 84 U/L (ref 40–150)
Anion Gap: 10 mEq/L (ref 3–11)
BILIRUBIN TOTAL: 0.37 mg/dL (ref 0.20–1.20)
BUN: 10.5 mg/dL (ref 7.0–26.0)
CO2: 23 mEq/L (ref 22–29)
CREATININE: 0.7 mg/dL (ref 0.6–1.1)
Calcium: 9.3 mg/dL (ref 8.4–10.4)
Chloride: 107 mEq/L (ref 98–109)
EGFR: >90 mL/min/{1.73_m2} (ref >90)
GLUCOSE: 113 mg/dL (ref 70–140)
Potassium: 4 mEq/L (ref 3.5–5.1)
SODIUM: 140 meq/L (ref 136–145)
TOTAL PROTEIN: 6.7 g/dL (ref 6.4–8.3)

## 2014-10-06 LAB — CBC WITH DIFFERENTIAL/PLATELET
BASO%: 0.4 % (ref 0.0–2.0)
Basophils Absolute: 0 10*3/uL (ref 0.0–0.1)
EOS%: 2.9 % (ref 0.0–7.0)
Eosinophils Absolute: 0.2 10*3/uL (ref 0.0–0.5)
HCT: 33.8 % — ABNORMAL LOW (ref 34.8–46.6)
HEMOGLOBIN: 11 g/dL — AB (ref 11.6–15.9)
LYMPH%: 19.6 % (ref 14.0–49.7)
MCH: 28.4 pg (ref 25.1–34.0)
MCHC: 32.5 g/dL (ref 31.5–36.0)
MCV: 87.1 fL (ref 79.5–101.0)
MONO#: 0.4 10*3/uL (ref 0.1–0.9)
MONO%: 6.4 % (ref 0.0–14.0)
NEUT%: 70.7 % (ref 38.4–76.8)
NEUTROS ABS: 3.9 10*3/uL (ref 1.5–6.5)
Platelets: 175 10*3/uL (ref 145–400)
RBC: 3.88 10*6/uL (ref 3.70–5.45)
RDW: 13.4 % (ref 11.2–14.5)
WBC: 5.5 10*3/uL (ref 3.9–10.3)
lymph#: 1.1 10*3/uL (ref 0.9–3.3)

## 2014-10-12 ENCOUNTER — Other Ambulatory Visit: Payer: BLUE CROSS/BLUE SHIELD

## 2014-10-12 LAB — IGG, IGA, IGM
IGG (IMMUNOGLOBIN G), SERUM: 434 mg/dL — AB (ref 690–1700)
IgA: 87 mg/dL (ref 69–380)
IgM, Serum: 565 mg/dL — ABNORMAL HIGH (ref 52–322)

## 2014-10-12 LAB — KAPPA/LAMBDA LIGHT CHAINS
KAPPA LAMBDA RATIO: 0.01 — AB (ref 0.26–1.65)
Kappa free light chain: 1.03 mg/dL (ref 0.33–1.94)
Lambda Free Lght Chn: 69.1 mg/dL — ABNORMAL HIGH (ref 0.57–2.63)

## 2014-10-12 LAB — BETA 2 MICROGLOBULIN, SERUM: Beta-2 Microglobulin: 2.03 mg/L

## 2014-10-19 ENCOUNTER — Ambulatory Visit (HOSPITAL_BASED_OUTPATIENT_CLINIC_OR_DEPARTMENT_OTHER): Payer: BLUE CROSS/BLUE SHIELD | Admitting: Internal Medicine

## 2014-10-19 ENCOUNTER — Encounter: Payer: Self-pay | Admitting: Internal Medicine

## 2014-10-19 ENCOUNTER — Telehealth: Payer: Self-pay | Admitting: Internal Medicine

## 2014-10-19 VITALS — BP 139/67 | HR 66 | Temp 98.3°F | Resp 18 | Ht 67.0 in | Wt 265.9 lb

## 2014-10-19 DIAGNOSIS — C9000 Multiple myeloma not having achieved remission: Secondary | ICD-10-CM

## 2014-10-19 DIAGNOSIS — G629 Polyneuropathy, unspecified: Secondary | ICD-10-CM

## 2014-10-19 NOTE — Telephone Encounter (Signed)
Gave and printed appt sched and avs for pt for Jan 2017 °

## 2014-10-19 NOTE — Progress Notes (Signed)
[facility] Telephone:([phone]   Fax:(336) [phone]  OFFICE PROGRESS NOTE  [name], MD [place] [address]  DIAGNOSIS: MGUS diagnosed in September 2010, with additional symptoms suggestive of POEMS syndrome.   PRIOR THERAPY: Velcade 1.3 MG/M2 subcutaneously with Decadron 40 mg by mouth on a weekly basis. First cycle 11/24/2013. She status post 31 weekly doses of treatment.   CURRENT THERAPY: Observation.  INTERVAL HISTORY: Brenda Conner 54 y.o. female returns to the clinic today for followup visit. The patient is feeling fine today with no specific complaints. She has been on observation for the last 3 months. She denied having any significant weight loss or night sweats. The patient denied having any fever or chills. She has no chest pain, shortness of breath, cough or hemoptysis. She denied having any aching pain except the left hip area. She had repeat myeloma panel and she is here for evaluation and discussion of her lab results.  MEDICAL HISTORY: Past Medical History  Diagnosis Date  . PONV (postoperative nausea and vomiting)   . Hypertension   . Anxiety   . Depression   . Asthma   . Sleep apnea     CPAP at bedtime  . Cancer [facility])     waldenstroms/ macroglobinulemia  . Hypercholesteremia   . Hypothyroidism   . Depression   . Macroglobulinemia ([place])     ? POEMS syndrome  . Acid reflux   . Multiple myeloma ([place])   . Diabetes mellitus without complication ([place])   . Obesity   . Sleep apnea     ALLERGIES:  is allergic to codeine; hydrocodone; lortab; onion; shellfish allergy; and amoxicillin.  MEDICATIONS:  Current Outpatient Prescriptions  Medication Sig Dispense Refill  . acyclovir (ZOVIRAX) 400 MG tablet Take 1 tablet (400 mg total) by mouth 2 (two) times daily. 60 tablet 3  . ALPRAZolam (XANAX) 0.5 MG tablet Take 0.5 mg by mouth at bedtime as needed for anxiety.     [name] aspirin 81 MG tablet Take 81 mg by mouth  daily.    . Azelastine HCl 0.15 % SOLN as needed.    . Blood Glucose Monitoring Suppl (ONE TOUCH ULTRA MINI) W/DEVICE KIT See admin instructions.  0  . cetirizine (ZYRTEC) 10 MG tablet Take 10 mg by mouth at bedtime.     [name] dexamethasone (DECADRON) 4 MG tablet 10 tablet by mouth every week. Start with the first dose of chemotherapy. 40 tablet 3  . fexofenadine (ALLEGRA) 180 MG tablet Take 180 mg by mouth every morning.    . Fluticasone-Salmeterol (ADVAIR) 100-50 MCG/DOSE AEPB Inhale 2 puffs into the lungs every 12 (twelve) hours.    [name] ibuprofen (ADVIL,MOTRIN) 100 MG tablet Take 100 mg by mouth every 6 (six) hours as needed.    . insulin glargine (LANTUS) 100 UNIT/ML injection Inject 8 Units into the skin 2 (two) times a week.    . levothyroxine (SYNTHROID, LEVOTHROID) 125 MCG tablet Take 125 mcg by mouth daily before breakfast.     . metFORMIN (GLUCOPHAGE) 500 MG tablet Take 1,000 mg by mouth 2 (two) times daily.  3  . NOVOLOG FLEXPEN 100 UNIT/ML FlexPen 3 Units daily as needed.  6  . omeprazole (PRILOSEC) 40 MG capsule Take 40 mg by mouth 2 (two) times daily.     . ondansetron (ZOFRAN-ODT) 8 MG disintegrating tablet Take 8 mg by mouth every 8 (eight) hours as needed.  2  . ONE TOUCH ULTRA  TEST test strip See admin instructions.  11  . pravastatin (PRAVACHOL) 40 MG tablet Take 40 mg by mouth every evening.     . prochlorperazine (COMPAZINE) 10 MG tablet Take 1 tablet (10 mg total) by mouth every 6 (six) hours as needed for nausea or vomiting. (Patient not taking: Reported on 07/13/2014) 30 tablet 0  . sertraline (ZOLOFT) 50 MG tablet TAKE 3 TABLETS BY MOUTH EVERY DAY 270 tablet 4  . terconazole (TERAZOL 7) 0.4 % vaginal cream   0  . UNABLE TO FIND Med Name: Allergy shots    . verapamil (CALAN-SR) 240 MG CR tablet Take 240 mg by mouth 2 (two) times daily.     No current facility-administered medications for this visit.    SURGICAL HISTORY:  Past Surgical History  Procedure Laterality Date  .  Cholecystectomy    . Back surgery  03/2004    herniation, L4-L5  . Bil laprascopic knee surgery    . Uterine fibroid embolization    . Bone marrow biopsy      2011  . Laparoscopic cholecystectomy  1997  . Foot surgery  1998  . Nasal sinus surgery  2004  . Ablation  09/2007    HTA and polyp resection  . Knee arthroscopy w/ meniscal repair  10/10 ; 3/11  . Bone marrow biopsy  4/14    REVIEW OF SYSTEMS:  A comprehensive review of systems was negative except for: Musculoskeletal: positive for arthralgias   PHYSICAL EXAMINATION: General appearance: alert, cooperative and no distress Head: Normocephalic, without obvious abnormality, atraumatic Neck: no adenopathy Lymph nodes: Cervical, supraclavicular, and axillary nodes normal. Resp: clear to auscultation bilaterally Back: symmetric, no curvature. ROM normal. No CVA tenderness. Cardio: regular rate and rhythm, S1, S2 normal, no murmur, click, rub or gallop GI: soft, non-tender; bowel sounds normal; no masses,  no organomegaly Extremities: extremities normal, atraumatic, no cyanosis or edema Neurologic: Alert and oriented X 3, normal strength and tone. Normal symmetric reflexes. Normal coordination and gait  ECOG PERFORMANCE STATUS: 0 - Asymptomatic  Blood pressure 139/67, pulse 66, temperature 98.3 F (36.8 C), temperature source Oral, resp. rate 18, height 5' 7" (1.702 m), weight 265 lb 14.4 oz (120.611 kg), SpO2 98 %.  LABORATORY DATA: Lab Results  Component Value Date   WBC 5.5 10/06/2014   HGB 11.0* 10/06/2014   HCT 33.8* 10/06/2014   MCV 87.1 10/06/2014   PLT 175 10/06/2014      Chemistry      Component Value Date/Time   NA 140 10/06/2014 0820   NA 137 03/20/2011 1509   K 4.0 10/06/2014 0820   K 3.4* 03/20/2011 1509   CL 104 04/07/2012 1415   CL 102 03/20/2011 1509   CO2 23 10/06/2014 0820   CO2 26 03/20/2011 1509   BUN 10.5 10/06/2014 0820   BUN 7 03/20/2011 1509   CREATININE 0.7 10/06/2014 0820   CREATININE  0.63 03/20/2011 1509      Component Value Date/Time   CALCIUM 9.3 10/06/2014 0820   CALCIUM 9.2 03/20/2011 1509   ALKPHOS 84 10/06/2014 0820   ALKPHOS 74 03/20/2011 1509   AST 42* 10/06/2014 0820   AST 24 03/20/2011 1509   ALT 43 10/06/2014 0820   ALT 26 03/20/2011 1509   BILITOT 0.37 10/06/2014 0820   BILITOT 0.3 03/20/2011 1509     Other lab results: Beta-2 microglobulin 2.03, free kappa light chain 1.03, free lambda light chain 69.10 with a kappa/lambda ratio of 0.01. IgG 434,  IgA 87 and IgM 565.  ASSESSMENT AND PLAN: This is a very pleasant 54 years old white female with a multiple myeloma initially diagnosed as monoclonal gammopathy in addition to peripheral neuropathy, and Endocrinopathy in the form of hypothyroidism and skin rash. The symptoms combined are suspicious for POEMS syndrome.  She completed treatment with subcutaneous tenderness Velcade and Decadron status post 31 cycles and tolerated her treatment fairly well. She is currently on observation. The recent myeloma panel showed mild increase in the free lambda light chain. I discussed the lab result with the patient.  I recommended for her to continue on observation for now. I would see her back for follow-up visit in 3 months for reevaluation with repeat myeloma panel. The patient will receive flu shot at her work today. She was advised to call immediately if her symptoms started getting worse as I may consider the patient for treatment with multiple myeloma regimen. The patient agreed to the current plan.  All questions were answered. The patient knows to call the clinic with any problems, questions or concerns. We can certainly see the patient much sooner if necessary.  Disclaimer: This note was dictated with voice recognition software. Similar sounding words can inadvertently be transcribed and may not be corrected upon review.

## 2014-10-19 NOTE — Telephone Encounter (Signed)
Gave and prnted appt sched and avs fo rpt for Jan 2017

## 2015-01-18 ENCOUNTER — Other Ambulatory Visit (HOSPITAL_BASED_OUTPATIENT_CLINIC_OR_DEPARTMENT_OTHER): Payer: BLUE CROSS/BLUE SHIELD

## 2015-01-18 DIAGNOSIS — C9000 Multiple myeloma not having achieved remission: Secondary | ICD-10-CM

## 2015-01-18 LAB — COMPREHENSIVE METABOLIC PANEL
ALT: 36 U/L (ref 0–55)
AST: 39 U/L — ABNORMAL HIGH (ref 5–34)
Albumin: 4 g/dL (ref 3.5–5.0)
Alkaline Phosphatase: 83 U/L (ref 40–150)
Anion Gap: 13 mEq/L — ABNORMAL HIGH (ref 3–11)
BUN: 8 mg/dL (ref 7.0–26.0)
CHLORIDE: 104 meq/L (ref 98–109)
CO2: 22 meq/L (ref 22–29)
CREATININE: 0.8 mg/dL (ref 0.6–1.1)
Calcium: 9.5 mg/dL (ref 8.4–10.4)
EGFR: 86 mL/min/{1.73_m2} — ABNORMAL LOW (ref >90)
Glucose: 116 mg/dl (ref 70–140)
POTASSIUM: 4.1 meq/L (ref 3.5–5.1)
Sodium: 139 mEq/L (ref 136–145)
TOTAL PROTEIN: 7.2 g/dL (ref 6.4–8.3)
Total Bilirubin: 0.37 mg/dL (ref 0.20–1.20)

## 2015-01-18 LAB — LACTATE DEHYDROGENASE: LDH: 149 U/L (ref 125–245)

## 2015-01-18 LAB — CBC WITH DIFFERENTIAL/PLATELET
BASO%: 0.9 % (ref 0.0–2.0)
BASOS ABS: 0 10*3/uL (ref 0.0–0.1)
EOS ABS: 0.2 10*3/uL (ref 0.0–0.5)
EOS%: 4 % (ref 0.0–7.0)
HEMATOCRIT: 33.4 % — AB (ref 34.8–46.6)
HEMOGLOBIN: 10.7 g/dL — AB (ref 11.6–15.9)
LYMPH#: 1 10*3/uL (ref 0.9–3.3)
LYMPH%: 19.7 % (ref 14.0–49.7)
MCH: 26.8 pg (ref 25.1–34.0)
MCHC: 32.1 g/dL (ref 31.5–36.0)
MCV: 83.4 fL (ref 79.5–101.0)
MONO#: 0.4 10*3/uL (ref 0.1–0.9)
MONO%: 6.9 % (ref 0.0–14.0)
NEUT#: 3.6 10*3/uL (ref 1.5–6.5)
NEUT%: 68.5 % (ref 38.4–76.8)
Platelets: 178 10*3/uL (ref 145–400)
RBC: 4 10*6/uL (ref 3.70–5.45)
RDW: 15.2 % — AB (ref 11.2–14.5)
WBC: 5.2 10*3/uL (ref 3.9–10.3)

## 2015-01-19 LAB — KAPPA/LAMBDA LIGHT CHAINS
Ig Kappa Free Light Chain: 10.95 mg/L (ref 3.30–19.40)
Ig Lambda Free Light Chain: 1456.16 mg/L — ABNORMAL HIGH (ref 5.71–26.30)
KAPPA/LAMBDA FLC RATIO: 0.01 — AB (ref 0.26–1.65)

## 2015-01-19 LAB — IGG, IGA, IGM
IGG (IMMUNOGLOBIN G), SERUM: 476 mg/dL — AB (ref 700–1600)
IGM (IMMUNOGLOBIN M), SRM: 767 mg/dL — AB (ref 26–217)
IgA, Qn, Serum: 87 mg/dL (ref 87–352)

## 2015-01-19 LAB — BETA 2 MICROGLOBULIN, SERUM: Beta-2: 1.9 mg/L (ref 0.6–2.4)

## 2015-01-25 ENCOUNTER — Ambulatory Visit (HOSPITAL_BASED_OUTPATIENT_CLINIC_OR_DEPARTMENT_OTHER): Payer: BLUE CROSS/BLUE SHIELD | Admitting: Internal Medicine

## 2015-01-25 ENCOUNTER — Encounter: Payer: Self-pay | Admitting: Internal Medicine

## 2015-01-25 ENCOUNTER — Telehealth: Payer: Self-pay | Admitting: Internal Medicine

## 2015-01-25 VITALS — BP 160/67 | HR 68 | Temp 97.7°F | Resp 19 | Ht 67.0 in | Wt 266.8 lb

## 2015-01-25 DIAGNOSIS — G629 Polyneuropathy, unspecified: Secondary | ICD-10-CM | POA: Diagnosis not present

## 2015-01-25 DIAGNOSIS — E8809 Other disorders of plasma-protein metabolism, not elsewhere classified: Secondary | ICD-10-CM | POA: Diagnosis not present

## 2015-01-25 DIAGNOSIS — C9000 Multiple myeloma not having achieved remission: Secondary | ICD-10-CM | POA: Diagnosis not present

## 2015-01-25 MED ORDER — ACYCLOVIR 400 MG PO TABS: 400.0000 mg | ORAL_TABLET | Freq: Two times a day (BID) | ORAL | Status: DC

## 2015-01-25 NOTE — Progress Notes (Signed)
[place] [phone]   [phone]  OFFICE PROGRESS NOTE  [name], MD Brenda Conner [address]  DIAGNOSIS: MGUS diagnosed in September 2010, with additional symptoms suggestive of POEMS syndrome.   PRIOR THERAPY: Velcade 1.3 MG/M2 subcutaneously with Decadron 40 mg by mouth on a weekly basis. First cycle 11/24/2013. She status post 31 weekly doses of treatment.   CURRENT THERAPY: The patient will resume treatment again with Velcade 1.3 MG/M2 subcutaneously and weekly basis with Decadron 40 mg by mouth weekly. First dose 02/01/2015.   INTERVAL HISTORY: Brenda Conner 55 y.o. female returns to the clinic today for followup visit. The patient is feeling fine today with no specific complaints. She has been on observation for the last 6 months and was doing well except for neuropathy in the fingers and toes as well as aching pain. Her primary care physician increased her dose of levothyroxine twice recently. She denied having any significant weight loss or night sweats. The patient denied having any fever or chills. She has no chest pain, shortness of breath, cough or hemoptysis. She denied having any aching pain except the left hip area. She had repeat myeloma panel and she is here for evaluation and discussion of her lab results.  MEDICAL HISTORY: Past Medical History  Diagnosis Date  . PONV (postoperative nausea and vomiting)   . Hypertension   . Anxiety   . Depression   . Asthma   . Sleep apnea     CPAP at bedtime  . Cancer [facility])     waldenstroms/ macroglobinulemia  . Hypercholesteremia   . Hypothyroidism   . Depression   . Macroglobulinemia ([place])     ? POEMS syndrome  . Acid reflux   . Multiple myeloma ([place])   . Diabetes mellitus without complication ([place])   . Obesity   . Sleep apnea     ALLERGIES:  is allergic to codeine; hydrocodone; lortab; onion; shellfish allergy; and amoxicillin.  MEDICATIONS:    Current Outpatient Prescriptions  Medication Sig Dispense Refill  . acyclovir (ZOVIRAX) 400 MG tablet Take 1 tablet (400 mg total) by mouth 2 (two) times daily. 60 tablet 3  . ALPRAZolam (XANAX) 0.5 MG tablet Take 0.5 mg by mouth at bedtime as needed for anxiety.     [name] aspirin 81 MG tablet Take 81 mg by mouth daily.    . Azelastine HCl 0.15 % SOLN as needed.    . Blood Glucose Monitoring Suppl (ONE TOUCH ULTRA MINI) W/DEVICE KIT See admin instructions.  0  . cetirizine (ZYRTEC) 10 MG tablet Take 10 mg by mouth at bedtime.     [name] dexamethasone (DECADRON) 4 MG tablet 10 tablet by mouth every week. Start with the first dose of chemotherapy. 40 tablet 3  . fexofenadine (ALLEGRA) 180 MG tablet Take 180 mg by mouth every morning.    . Fluticasone-Salmeterol (ADVAIR) 100-50 MCG/DOSE AEPB Inhale 2 puffs into the lungs every 12 (twelve) hours.    [name] ibuprofen (ADVIL,MOTRIN) 100 MG tablet Take 100 mg by mouth every 6 (six) hours as needed.    . insulin glargine (LANTUS) 100 UNIT/ML injection Inject 8 Units into the skin 2 (two) times a week.    . levothyroxine (SYNTHROID, LEVOTHROID) 125 MCG tablet Take 125 mcg by mouth daily before breakfast.     . metFORMIN (GLUCOPHAGE) 500 MG tablet Take 1,000 mg by mouth 2 (two) times daily.  3  . NOVOLOG FLEXPEN 100 UNIT/ML  FlexPen 3 Units daily as needed.  6  . omeprazole (PRILOSEC) 40 MG capsule Take 40 mg by mouth 2 (two) times daily.     . ondansetron (ZOFRAN-ODT) 8 MG disintegrating tablet Take 8 mg by mouth every 8 (eight) hours as needed.  2  . ONE TOUCH ULTRA TEST test strip See admin instructions.  11  . pravastatin (PRAVACHOL) 40 MG tablet Take 40 mg by mouth every evening.     . prochlorperazine (COMPAZINE) 10 MG tablet Take 1 tablet (10 mg total) by mouth every 6 (six) hours as needed for nausea or vomiting. (Patient not taking: Reported on 07/13/2014) 30 tablet 0  . sertraline (ZOLOFT) 50 MG tablet TAKE 3 TABLETS BY MOUTH EVERY DAY 270 tablet 4  .  terconazole (TERAZOL 7) 0.4 % vaginal cream   0  . UNABLE TO FIND Med Name: Allergy shots    . verapamil (CALAN-SR) 240 MG CR tablet Take 240 mg by mouth 2 (two) times daily.     No current facility-administered medications for this visit.    SURGICAL HISTORY:  Past Surgical History  Procedure Laterality Date  . Cholecystectomy    . Back surgery  03/2004    herniation, L4-L5  . Bil laprascopic knee surgery    . Uterine fibroid embolization    . Bone marrow biopsy      2011  . Laparoscopic cholecystectomy  1997  . Foot surgery  1998  . Nasal sinus surgery  2004  . Ablation  09/2007    HTA and polyp resection  . Knee arthroscopy w/ meniscal repair  10/10 ; 3/11  . Bone marrow biopsy  4/14    REVIEW OF SYSTEMS:  Constitutional: negative Eyes: negative Ears, nose, mouth, throat, and face: negative Respiratory: negative Cardiovascular: negative Gastrointestinal: negative Genitourinary:negative Integument/breast: negative Hematologic/lymphatic: negative Musculoskeletal:positive for arthralgias and myalgias Neurological: positive for paresthesia Behavioral/Psych: negative Endocrine: negative Allergic/Immunologic: negative   PHYSICAL EXAMINATION: General appearance: alert, cooperative and no distress Head: Normocephalic, without obvious abnormality, atraumatic Neck: no adenopathy Lymph nodes: Cervical, supraclavicular, and axillary nodes normal. Resp: clear to auscultation bilaterally Back: symmetric, no curvature. ROM normal. No CVA tenderness. Cardio: regular rate and rhythm, S1, S2 normal, no murmur, click, rub or gallop GI: soft, non-tender; bowel sounds normal; no masses,  no organomegaly Extremities: extremities normal, atraumatic, no cyanosis or edema Neurologic: Alert and oriented X 3, normal strength and tone. Normal symmetric reflexes. Normal coordination and gait  ECOG PERFORMANCE STATUS: 0 - Asymptomatic  Blood pressure 160/67, pulse 68, temperature 97.7 F  (36.5 C), temperature source Oral, resp. rate 19, height _0  (1.702 m), weight 266 lb 12.8 oz (121.02 kg), SpO2 99 %.  LABORATORY DATA: Lab Results  Component Value Date   WBC 5.2 01/18/2015   HGB 10.7* 01/18/2015   HCT 33.4* 01/18/2015   MCV 83.4 01/18/2015   PLT 178 01/18/2015      Chemistry      Component Value Date/Time   NA 139 01/18/2015 0759   NA 137 03/20/2011 1509   K 4.1 01/18/2015 0759   K 3.4* 03/20/2011 1509   CL 104 04/07/2012 1415   CL 102 03/20/2011 1509   CO2 22 01/18/2015 0759   CO2 26 03/20/2011 1509   BUN 8.0 01/18/2015 0759   BUN 7 03/20/2011 1509   CREATININE 0.8 01/18/2015 0759   CREATININE 0.63 03/20/2011 1509      Component Value Date/Time   CALCIUM 9.5 01/18/2015 0759   CALCIUM 9.2 03/20/2011  1509   ALKPHOS 83 01/18/2015 0759   ALKPHOS 74 03/20/2011 1509   AST 39* 01/18/2015 0759   AST 24 03/20/2011 1509   ALT 36 01/18/2015 0759   ALT 26 03/20/2011 1509   BILITOT 0.37 01/18/2015 0759   BILITOT 0.3 03/20/2011 1509     Other lab results: Beta-2 microglobulin 1.9, free kappa light chain 10.95, free lambda light chain 1456.16.10 with a kappa/lambda ratio of 0.01. IgG 476, IgA 87 and IgM 767.  ASSESSMENT AND PLAN: This is a very pleasant 55 years old white female with a multiple myeloma initially diagnosed as monoclonal gammopathy in addition to peripheral neuropathy, and Endocrinopathy in the form of hypothyroidism and skin rash. The symptoms combined are suspicious for POEMS syndrome.  She completed treatment with subcutaneous tenderness Velcade and Decadron status post 31 cycles and tolerated her treatment fairly well. She has been on observation for the last 6 months. 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 I discussed the lab result with the patient.  I gave her the option of resuming treatment with Velcade and Decadron versus  continuous observation and close monitoring. The patient would like to resume her treatment. She is expected to start the first dose of this treatment on 02/01/2015. I will call her pharmacy with a refill for acyclovir. She was advised to call immediately if her symptoms started getting worse as I may consider the patient for treatment with multiple myeloma regimen. The patient agreed to the current plan.  All questions were answered. The patient knows to call the clinic with any problems, questions or concerns. We can certainly see the patient much sooner if necessary.  Disclaimer: This note was dictated with voice recognition software. Similar sounding words can inadvertently be transcribed and may not be corrected upon review.

## 2015-01-25 NOTE — Telephone Encounter (Signed)
per pof to sch pt appt-*sent MW email to [facility] stated will look on MY CHART for updated sch

## 2015-02-01 ENCOUNTER — Ambulatory Visit (HOSPITAL_BASED_OUTPATIENT_CLINIC_OR_DEPARTMENT_OTHER): Payer: BLUE CROSS/BLUE SHIELD

## 2015-02-01 ENCOUNTER — Other Ambulatory Visit (HOSPITAL_BASED_OUTPATIENT_CLINIC_OR_DEPARTMENT_OTHER): Payer: BLUE CROSS/BLUE SHIELD

## 2015-02-01 ENCOUNTER — Other Ambulatory Visit: Payer: Self-pay | Admitting: Medical Oncology

## 2015-02-01 VITALS — BP 154/74 | HR 80 | Temp 97.9°F | Resp 20

## 2015-02-01 DIAGNOSIS — Z5112 Encounter for antineoplastic immunotherapy: Secondary | ICD-10-CM

## 2015-02-01 DIAGNOSIS — C9000 Multiple myeloma not having achieved remission: Secondary | ICD-10-CM

## 2015-02-01 LAB — COMPREHENSIVE METABOLIC PANEL
ALBUMIN: 4 g/dL (ref 3.5–5.0)
ALK PHOS: 92 U/L (ref 40–150)
ALT: 42 U/L (ref 0–55)
AST: 41 U/L — AB (ref 5–34)
Anion Gap: 12 mEq/L — ABNORMAL HIGH (ref 3–11)
BILIRUBIN TOTAL: 0.43 mg/dL (ref 0.20–1.20)
BUN: 7.8 mg/dL (ref 7.0–26.0)
CALCIUM: 9.6 mg/dL (ref 8.4–10.4)
CO2: 20 mEq/L — ABNORMAL LOW (ref 22–29)
Chloride: 105 mEq/L (ref 98–109)
Creatinine: 0.8 mg/dL (ref 0.6–1.1)
EGFR: 81 mL/min/{1.73_m2} — ABNORMAL LOW (ref >90)
GLUCOSE: 174 mg/dL — AB (ref 70–140)
Potassium: 3.9 mEq/L (ref 3.5–5.1)
SODIUM: 137 meq/L (ref 136–145)
TOTAL PROTEIN: 7.6 g/dL (ref 6.4–8.3)

## 2015-02-01 LAB — CBC WITH DIFFERENTIAL/PLATELET
BASO%: 0.8 % (ref 0.0–2.0)
Basophils Absolute: 0.1 10*3/uL (ref 0.0–0.1)
EOS ABS: 0.2 10*3/uL (ref 0.0–0.5)
EOS%: 2.9 % (ref 0.0–7.0)
HEMATOCRIT: 33.2 % — AB (ref 34.8–46.6)
HEMOGLOBIN: 11 g/dL — AB (ref 11.6–15.9)
LYMPH#: 1.2 10*3/uL (ref 0.9–3.3)
LYMPH%: 16.1 % (ref 14.0–49.7)
MCH: 27.5 pg (ref 25.1–34.0)
MCHC: 33 g/dL (ref 31.5–36.0)
MCV: 83.3 fL (ref 79.5–101.0)
MONO#: 0.4 10*3/uL (ref 0.1–0.9)
MONO%: 5.7 % (ref 0.0–14.0)
NEUT%: 74.5 % (ref 38.4–76.8)
NEUTROS ABS: 5.5 10*3/uL (ref 1.5–6.5)
Platelets: 209 10*3/uL (ref 145–400)
RBC: 3.99 10*6/uL (ref 3.70–5.45)
RDW: 15.3 % — AB (ref 11.2–14.5)
WBC: 7.4 10*3/uL (ref 3.9–10.3)

## 2015-02-01 MED ORDER — BORTEZOMIB CHEMO SQ INJECTION 3.5 MG (2.5MG/ML)
1.3000 mg/m2 | Freq: Once | INTRAMUSCULAR | Status: AC
  Administered 2015-02-01: 3.25 mg via SUBCUTANEOUS
  Filled 2015-02-01: qty 3.25

## 2015-02-01 MED ORDER — ONDANSETRON HCL 8 MG PO TABS
8.0000 mg | ORAL_TABLET | Freq: Once | ORAL | Status: AC
  Administered 2015-02-01: 8 mg via ORAL

## 2015-02-01 MED ORDER — ONDANSETRON HCL 8 MG PO TABS
ORAL_TABLET | ORAL | Status: AC
  Filled 2015-02-01: qty 1

## 2015-02-01 NOTE — Patient Instructions (Signed)
[facility] Discharge Instructions for Patients Receiving Chemotherapy  Today you received the following chemotherapy agents Velcade. To help prevent nausea and vomiting after your treatment, we encourage you to take your nausea medication as directed.  If you develop nausea and vomiting that is not controlled by your nausea medication, call the clinic.   BELOW ARE SYMPTOMS THAT SHOULD BE REPORTED IMMEDIATELY:  *FEVER GREATER THAN 100.5 F  *CHILLS WITH OR WITHOUT FEVER  NAUSEA AND VOMITING THAT IS NOT CONTROLLED WITH YOUR NAUSEA MEDICATION  *UNUSUAL SHORTNESS OF BREATH  *UNUSUAL BRUISING OR BLEEDING  TENDERNESS IN MOUTH AND THROAT WITH OR WITHOUT PRESENCE OF ULCERS  *URINARY PROBLEMS  *BOWEL PROBLEMS  UNUSUAL RASH Items with * indicate a potential emergency and should be followed up as soon as possible.  Feel free to call the clinic you have any questions or concerns. The clinic phone number is [phone].  Please show the CHEMO ALERT CARD at check-in to the Emergency Department and triage nurse.    

## 2015-02-08 ENCOUNTER — Other Ambulatory Visit (HOSPITAL_BASED_OUTPATIENT_CLINIC_OR_DEPARTMENT_OTHER): Payer: BLUE CROSS/BLUE SHIELD

## 2015-02-08 ENCOUNTER — Other Ambulatory Visit: Payer: Self-pay | Admitting: Medical Oncology

## 2015-02-08 ENCOUNTER — Ambulatory Visit (HOSPITAL_BASED_OUTPATIENT_CLINIC_OR_DEPARTMENT_OTHER): Payer: BLUE CROSS/BLUE SHIELD

## 2015-02-08 VITALS — BP 141/70 | HR 72 | Temp 97.7°F

## 2015-02-08 DIAGNOSIS — Z5112 Encounter for antineoplastic immunotherapy: Secondary | ICD-10-CM | POA: Diagnosis not present

## 2015-02-08 DIAGNOSIS — C9000 Multiple myeloma not having achieved remission: Secondary | ICD-10-CM

## 2015-02-08 LAB — CBC WITH DIFFERENTIAL/PLATELET
BASO%: 0.8 % (ref 0.0–2.0)
BASOS ABS: 0.1 10*3/uL (ref 0.0–0.1)
EOS ABS: 0.2 10*3/uL (ref 0.0–0.5)
EOS%: 2.9 % (ref 0.0–7.0)
HCT: 34.2 % — ABNORMAL LOW (ref 34.8–46.6)
HGB: 11.1 g/dL — ABNORMAL LOW (ref 11.6–15.9)
LYMPH%: 18 % (ref 14.0–49.7)
MCH: 27.2 pg (ref 25.1–34.0)
MCHC: 32.5 g/dL (ref 31.5–36.0)
MCV: 83.7 fL (ref 79.5–101.0)
MONO#: 0.5 10*3/uL (ref 0.1–0.9)
MONO%: 7.8 % (ref 0.0–14.0)
NEUT#: 4.7 10*3/uL (ref 1.5–6.5)
NEUT%: 70.5 % (ref 38.4–76.8)
Platelets: 184 10*3/uL (ref 145–400)
RBC: 4.08 10*6/uL (ref 3.70–5.45)
RDW: 15.3 % — ABNORMAL HIGH (ref 11.2–14.5)
WBC: 6.7 10*3/uL (ref 3.9–10.3)
lymph#: 1.2 10*3/uL (ref 0.9–3.3)

## 2015-02-08 LAB — COMPREHENSIVE METABOLIC PANEL
ALK PHOS: 95 U/L (ref 40–150)
ALT: 59 U/L — ABNORMAL HIGH (ref 0–55)
AST: 85 U/L — ABNORMAL HIGH (ref 5–34)
Albumin: 3.7 g/dL (ref 3.5–5.0)
Anion Gap: 13 mEq/L — ABNORMAL HIGH (ref 3–11)
BUN: 7.3 mg/dL (ref 7.0–26.0)
CALCIUM: 9.1 mg/dL (ref 8.4–10.4)
CO2: 20 mEq/L — ABNORMAL LOW (ref 22–29)
CREATININE: 0.8 mg/dL (ref 0.6–1.1)
Chloride: 105 mEq/L (ref 98–109)
EGFR: 84 mL/min/{1.73_m2} — ABNORMAL LOW (ref >90)
Glucose: 149 mg/dl — ABNORMAL HIGH (ref 70–140)
Potassium: 3.9 mEq/L (ref 3.5–5.1)
SODIUM: 138 meq/L (ref 136–145)
Total Bilirubin: 0.46 mg/dL (ref 0.20–1.20)
Total Protein: 7.1 g/dL (ref 6.4–8.3)

## 2015-02-08 MED ORDER — ONDANSETRON HCL 8 MG PO TABS
8.0000 mg | ORAL_TABLET | Freq: Once | ORAL | Status: AC
  Administered 2015-02-08: 8 mg via ORAL

## 2015-02-08 MED ORDER — BORTEZOMIB CHEMO SQ INJECTION 3.5 MG (2.5MG/ML)
1.3000 mg/m2 | Freq: Once | INTRAMUSCULAR | Status: AC
  Administered 2015-02-08: 3.25 mg via SUBCUTANEOUS
  Filled 2015-02-08: qty 3.25

## 2015-02-08 MED ORDER — ONDANSETRON HCL 8 MG PO TABS
ORAL_TABLET | ORAL | Status: AC
  Filled 2015-02-08: qty 1

## 2015-02-08 NOTE — Patient Instructions (Signed)
[facility] Discharge Instructions for Patients Receiving Chemotherapy  Today you received the following chemotherapy agents:  Velcade  To help prevent nausea and vomiting after your treatment, we encourage you to take your nausea medication as prescribed.   If you develop nausea and vomiting that is not controlled by your nausea medication, call the clinic.   BELOW ARE SYMPTOMS THAT SHOULD BE REPORTED IMMEDIATELY:  *FEVER GREATER THAN 100.5 F  *CHILLS WITH OR WITHOUT FEVER  NAUSEA AND VOMITING THAT IS NOT CONTROLLED WITH YOUR NAUSEA MEDICATION  *UNUSUAL SHORTNESS OF BREATH  *UNUSUAL BRUISING OR BLEEDING  TENDERNESS IN MOUTH AND THROAT WITH OR WITHOUT PRESENCE OF ULCERS  *URINARY PROBLEMS  *BOWEL PROBLEMS  UNUSUAL RASH Items with * indicate a potential emergency and should be followed up as soon as possible.  Feel free to call the clinic you have any questions or concerns. The clinic phone number is [phone].  Please show the CHEMO ALERT CARD at check-in to the Emergency Department and triage nurse.   

## 2015-02-08 NOTE — Progress Notes (Signed)
Ok to treat per Dr. Mohamed. 

## 2015-02-14 ENCOUNTER — Other Ambulatory Visit: Payer: Self-pay | Admitting: Medical Oncology

## 2015-02-14 DIAGNOSIS — C9000 Multiple myeloma not having achieved remission: Secondary | ICD-10-CM

## 2015-02-15 ENCOUNTER — Telehealth: Payer: Self-pay | Admitting: Internal Medicine

## 2015-02-15 ENCOUNTER — Other Ambulatory Visit (HOSPITAL_BASED_OUTPATIENT_CLINIC_OR_DEPARTMENT_OTHER): Payer: BLUE CROSS/BLUE SHIELD

## 2015-02-15 ENCOUNTER — Encounter: Payer: Self-pay | Admitting: Internal Medicine

## 2015-02-15 ENCOUNTER — Ambulatory Visit (HOSPITAL_BASED_OUTPATIENT_CLINIC_OR_DEPARTMENT_OTHER): Payer: BLUE CROSS/BLUE SHIELD

## 2015-02-15 ENCOUNTER — Ambulatory Visit (HOSPITAL_BASED_OUTPATIENT_CLINIC_OR_DEPARTMENT_OTHER): Payer: BLUE CROSS/BLUE SHIELD | Admitting: Internal Medicine

## 2015-02-15 ENCOUNTER — Telehealth: Payer: Self-pay | Admitting: *Deleted

## 2015-02-15 VITALS — BP 146/55 | HR 72 | Temp 97.7°F | Resp 18 | Ht 67.0 in | Wt 268.2 lb

## 2015-02-15 DIAGNOSIS — R5383 Other fatigue: Secondary | ICD-10-CM

## 2015-02-15 DIAGNOSIS — C9000 Multiple myeloma not having achieved remission: Secondary | ICD-10-CM

## 2015-02-15 DIAGNOSIS — R197 Diarrhea, unspecified: Secondary | ICD-10-CM | POA: Diagnosis not present

## 2015-02-15 DIAGNOSIS — E8809 Other disorders of plasma-protein metabolism, not elsewhere classified: Secondary | ICD-10-CM

## 2015-02-15 DIAGNOSIS — Z5112 Encounter for antineoplastic immunotherapy: Secondary | ICD-10-CM | POA: Diagnosis not present

## 2015-02-15 LAB — CBC WITH DIFFERENTIAL/PLATELET
BASO%: 0.6 % (ref 0.0–2.0)
Basophils Absolute: 0 10*3/uL (ref 0.0–0.1)
EOS%: 2.1 % (ref 0.0–7.0)
Eosinophils Absolute: 0.2 10*3/uL (ref 0.0–0.5)
HCT: 33.1 % — ABNORMAL LOW (ref 34.8–46.6)
HGB: 10.9 g/dL — ABNORMAL LOW (ref 11.6–15.9)
LYMPH%: 15.7 % (ref 14.0–49.7)
MCH: 27.6 pg (ref 25.1–34.0)
MCHC: 33 g/dL (ref 31.5–36.0)
MCV: 83.8 fL (ref 79.5–101.0)
MONO#: 0.4 10*3/uL (ref 0.1–0.9)
MONO%: 5.8 % (ref 0.0–14.0)
NEUT%: 75.8 % (ref 38.4–76.8)
NEUTROS ABS: 5.6 10*3/uL (ref 1.5–6.5)
PLATELETS: 169 10*3/uL (ref 145–400)
RBC: 3.95 10*6/uL (ref 3.70–5.45)
RDW: 15.6 % — ABNORMAL HIGH (ref 11.2–14.5)
WBC: 7.4 10*3/uL (ref 3.9–10.3)
lymph#: 1.2 10*3/uL (ref 0.9–3.3)

## 2015-02-15 LAB — COMPREHENSIVE METABOLIC PANEL
ALK PHOS: 91 U/L (ref 40–150)
ALT: 50 U/L (ref 0–55)
ANION GAP: 14 meq/L — AB (ref 3–11)
AST: 40 U/L — AB (ref 5–34)
Albumin: 3.8 g/dL (ref 3.5–5.0)
BILIRUBIN TOTAL: 0.49 mg/dL (ref 0.20–1.20)
BUN: 9.5 mg/dL (ref 7.0–26.0)
CO2: 23 meq/L (ref 22–29)
CREATININE: 0.8 mg/dL (ref 0.6–1.1)
Calcium: 9.8 mg/dL (ref 8.4–10.4)
Chloride: 101 mEq/L (ref 98–109)
EGFR: 87 mL/min/{1.73_m2} — ABNORMAL LOW (ref >90)
GLUCOSE: 128 mg/dL (ref 70–140)
Potassium: 4.2 mEq/L (ref 3.5–5.1)
SODIUM: 139 meq/L (ref 136–145)
TOTAL PROTEIN: 7.3 g/dL (ref 6.4–8.3)

## 2015-02-15 MED ORDER — BORTEZOMIB CHEMO SQ INJECTION 3.5 MG (2.5MG/ML)
1.3000 mg/m2 | Freq: Once | INTRAMUSCULAR | Status: AC
  Administered 2015-02-15: 3.25 mg via SUBCUTANEOUS
  Filled 2015-02-15: qty 3.25

## 2015-02-15 MED ORDER — ONDANSETRON HCL 8 MG PO TABS
8.0000 mg | ORAL_TABLET | Freq: Once | ORAL | Status: AC
  Administered 2015-02-15: 8 mg via ORAL

## 2015-02-15 MED ORDER — ONDANSETRON HCL 8 MG PO TABS
ORAL_TABLET | ORAL | Status: AC
  Filled 2015-02-15: qty 1

## 2015-02-15 NOTE — Telephone Encounter (Signed)
perp of to sch pt appt-sent MW email to sch trmt-pt to check MY CHART for updated copy

## 2015-02-15 NOTE — Progress Notes (Signed)
[name] Telephone:(336) [phone]   Fax:(336) [phone]  OFFICE PROGRESS NOTE  [name], MD [place] [address]  DIAGNOSIS: MGUS diagnosed in September 2010, with additional symptoms suggestive of POEMS syndrome.   PRIOR THERAPY: Velcade 1.3 MG/M2 subcutaneously with Decadron 40 mg by mouth on a weekly basis. First cycle 11/24/2013. She status post 31 weekly doses of treatment.   CURRENT THERAPY: The patient will resume treatment again with Velcade 1.3 MG/M2 subcutaneously and weekly basis with Decadron 40 mg by mouth weekly. First dose 02/01/2015. Status post 2 cycles.   INTERVAL HISTORY: Brenda Conner 55 y.o. female returns to the clinic today for followup visit. The patient is feeling fine today with no specific complaints except for fatigue after resuming her treatment with subcutaneous Velcade and Decadron. She also has few episodes of diarrhea improved with Imodium.  She denied having any significant weight loss or night sweats. The patient denied having any fever or chills. She has no chest pain, shortness of breath, cough or hemoptysis. She is here today to start cycle #3.  MEDICAL HISTORY: Past Medical History  Diagnosis Date  . PONV (postoperative nausea and vomiting)   . Hypertension   . Anxiety   . Depression   . Asthma   . Sleep apnea     CPAP at bedtime  . Cancer [facility])     waldenstroms/ macroglobinulemia  . Hypercholesteremia   . Hypothyroidism   . Depression   . Macroglobulinemia ([place])     ? POEMS syndrome  . Acid reflux   . Multiple myeloma ([place])   . Diabetes mellitus without complication ([place])   . Obesity   . Sleep apnea     ALLERGIES:  is allergic to codeine; hydrocodone; lortab; onion; shellfish allergy; and amoxicillin.  MEDICATIONS:  Current Outpatient Prescriptions  Medication Sig Dispense Refill  . acyclovir (ZOVIRAX) 400 MG tablet Take 1 tablet (400 mg total) by mouth 2 (two) times daily. 60  tablet 3  . acyclovir (ZOVIRAX) 400 MG tablet Take 1 tablet (400 mg total) by mouth 2 (two) times daily. 60 tablet 3  . ALPRAZolam (XANAX) 0.5 MG tablet Take 0.5 mg by mouth at bedtime as needed for anxiety.     [name] aspirin 81 MG tablet Take 81 mg by mouth daily.    . Azelastine HCl 0.15 % SOLN as needed.    . Blood Glucose Monitoring Suppl (ONE TOUCH ULTRA MINI) W/DEVICE KIT See admin instructions. Reported on 01/25/2015  0  . cetirizine (ZYRTEC) 10 MG tablet Take 10 mg by mouth at bedtime.     [name] dexamethasone (DECADRON) 4 MG tablet 10 tablet by mouth every week. Start with the first dose of chemotherapy. 40 tablet 3  . EPIPEN 2-PAK 0.3 MG/0.3ML SOAJ injection Reported on 01/25/2015  1  . fexofenadine (ALLEGRA) 180 MG tablet Take 180 mg by mouth every morning.    . Fluticasone-Salmeterol (ADVAIR) 100-50 MCG/DOSE AEPB Inhale 2 puffs into the lungs every 12 (twelve) hours.    [name] ibuprofen (ADVIL,MOTRIN) 100 MG tablet Take 100 mg by mouth every 6 (six) hours as needed. Reported on 01/25/2015    . insulin glargine (LANTUS) 100 UNIT/ML injection Inject 8 Units into the skin 2 (two) times a week. Reported on 01/25/2015    . metFORMIN (GLUCOPHAGE) 1000 MG tablet Take 1,000 mg by mouth 2 (two) times daily.  3  . NOVOLOG FLEXPEN 100 UNIT/ML FlexPen 3 Units daily as needed. Reported  on 01/25/2015  6  . omeprazole (PRILOSEC) 40 MG capsule Take 40 mg by mouth 2 (two) times daily.     . ondansetron (ZOFRAN-ODT) 8 MG disintegrating tablet Take 8 mg by mouth every 8 (eight) hours as needed. Reported on 01/25/2015  2  . ONE TOUCH ULTRA TEST test strip See admin instructions. Reported on 01/25/2015  11  . pravastatin (PRAVACHOL) 40 MG tablet Take 40 mg by mouth every evening.     . prochlorperazine (COMPAZINE) 10 MG tablet Take 1 tablet (10 mg total) by mouth every 6 (six) hours as needed for nausea or vomiting. 30 tablet 0  . sertraline (ZOLOFT) 50 MG tablet TAKE 3 TABLETS BY MOUTH EVERY DAY 270 tablet 4  . SYNTHROID  137 MCG tablet Take 137 mcg by mouth daily.  0  . terconazole (TERAZOL 7) 0.4 % vaginal cream   0  . verapamil (CALAN-SR) 240 MG CR tablet Take 240 mg by mouth 2 (two) times daily.     No current facility-administered medications for this visit.    SURGICAL HISTORY:  Past Surgical History  Procedure Laterality Date  . Cholecystectomy    . Back surgery  03/2004    herniation, L4-L5  . Bil laprascopic knee surgery    . Uterine fibroid embolization    . Bone marrow biopsy      2011  . Laparoscopic cholecystectomy  1997  . Foot surgery  1998  . Nasal sinus surgery  2004  . Ablation  09/2007    HTA and polyp resection  . Knee arthroscopy w/ meniscal repair  10/10 ; 3/11  . Bone marrow biopsy  4/14    REVIEW OF SYSTEMS:  A comprehensive review of systems was negative except for: Constitutional: positive for fatigue Gastrointestinal: positive for diarrhea   PHYSICAL EXAMINATION: General appearance: alert, cooperative and no distress Head: Normocephalic, without obvious abnormality, atraumatic Neck: no adenopathy Lymph nodes: Cervical, supraclavicular, and axillary nodes normal. Resp: clear to auscultation bilaterally Back: symmetric, no curvature. ROM normal. No CVA tenderness. Cardio: regular rate and rhythm, S1, S2 normal, no murmur, click, rub or gallop GI: soft, non-tender; bowel sounds normal; no masses,  no organomegaly Extremities: extremities normal, atraumatic, no cyanosis or edema Neurologic: Alert and oriented X 3, normal strength and tone. Normal symmetric reflexes. Normal coordination and gait  ECOG PERFORMANCE STATUS: 0 - Asymptomatic  Blood pressure 146/55, pulse 72, temperature 97.7 F (36.5 C), temperature source Oral, resp. rate 18, height 5' 7"  (1.702 m), weight 268 lb 3.2 oz (121.655 kg), SpO2 99 %.  LABORATORY DATA: Lab Results  Component Value Date   WBC 7.4 02/15/2015   HGB 10.9* 02/15/2015   HCT 33.1* 02/15/2015   MCV 83.8 02/15/2015   PLT 169  02/15/2015      Chemistry      Component Value Date/Time   NA 138 02/08/2015 0844   NA 137 03/20/2011 1509   K 3.9 02/08/2015 0844   K 3.4* 03/20/2011 1509   CL 104 04/07/2012 1415   CL 102 03/20/2011 1509   CO2 20* 02/08/2015 0844   CO2 26 03/20/2011 1509   BUN 7.3 02/08/2015 0844   BUN 7 03/20/2011 1509   CREATININE 0.8 02/08/2015 0844   CREATININE 0.63 03/20/2011 1509      Component Value Date/Time   CALCIUM 9.1 02/08/2015 0844   CALCIUM 9.2 03/20/2011 1509   ALKPHOS 95 02/08/2015 0844   ALKPHOS 74 03/20/2011 1509   AST 85* 02/08/2015 0844  AST 24 03/20/2011 1509   ALT 59* 02/08/2015 0844   ALT 26 03/20/2011 1509   BILITOT 0.46 02/08/2015 0844   BILITOT 0.3 03/20/2011 1509     Other lab results: Beta-2 microglobulin 1.9, free kappa light chain 10.95, free lambda light chain 1456.16.10 with a kappa/lambda ratio of 0.01. IgG 476, IgA 87 and IgM 767.  ASSESSMENT AND PLAN: This is a very pleasant 55 years old white female with a multiple myeloma initially diagnosed as monoclonal gammopathy in addition to peripheral neuropathy, and Endocrinopathy in the form of hypothyroidism and skin rash. The symptoms combined are suspicious for POEMS syndrome.  She completed treatment with subcutaneous tenderness Velcade and Decadron status post 31 cycles and tolerated her treatment fairly well. She has been on observation for the last 6 months. 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 She resumed her treatment with subcutaneous Velcade and Decadron status post 2 cycles. She is tolerating the treatment well except for mild fatigue and few episodes of diarrhea. I recommended for the patient to proceed with cycle #3 today as a scheduled. She has a business trip on 03/01/2015 and she will skip that week of the treatment. The patient would come back for follow-up visit in 3 weeks for  reevaluation. She was advised to call immediately if she has any concerning symptoms in the interval.  All questions were answered. The patient knows to call the clinic with any problems, questions or concerns. We can certainly see the patient much sooner if necessary.  Disclaimer: This note was dictated with voice recognition software. Similar sounding words can inadvertently be transcribed and may not be corrected upon review.

## 2015-02-15 NOTE — Telephone Encounter (Signed)
I have adjusted appts per staff message

## 2015-02-18 ENCOUNTER — Encounter: Payer: Self-pay | Admitting: Obstetrics & Gynecology

## 2015-02-18 ENCOUNTER — Ambulatory Visit (INDEPENDENT_AMBULATORY_CARE_PROVIDER_SITE_OTHER): Payer: BLUE CROSS/BLUE SHIELD | Admitting: Obstetrics & Gynecology

## 2015-02-18 VITALS — BP 138/66 | HR 68 | Resp 16 | Ht 67.0 in | Wt 267.0 lb

## 2015-02-18 DIAGNOSIS — Z124 Encounter for screening for malignant neoplasm of cervix: Secondary | ICD-10-CM | POA: Diagnosis not present

## 2015-02-18 DIAGNOSIS — Z01419 Encounter for gynecological examination (general) (routine) without abnormal findings: Secondary | ICD-10-CM | POA: Diagnosis not present

## 2015-02-18 MED ORDER — ALPRAZOLAM 0.5 MG PO TABS: 0.5000 mg | ORAL_TABLET | Freq: Every evening | ORAL | Status: DC | PRN

## 2015-02-18 MED ORDER — SERTRALINE HCL 50 MG PO TABS: 150.0000 mg | ORAL_TABLET | Freq: Every day | ORAL | Status: DC

## 2015-02-18 NOTE — Progress Notes (Signed)
55 y.o. G0P0000 SingleCaucasianF here for annual exam.  On chemo for Multiple Myeloma.  Dr. Julien Nordmann is following pt.  This is the second round of chemo for her.  Had CBC and CMP on Tuesday.  Steroids from treatment pushed her into diabetes.  Being followed by Dr. Chalmers Cater.  On Metformin.  She takes insulin on Tuesday and Wednesday when she take Dexamethasone.  Pt had minimal spotting in December.  Having hot flashes which started after restarting the chemotherapy.  Patient's last menstrual period was 12/31/2014.          Sexually active: No.  The current method of family planning is abstinence.    Exercising: No.  The patient does not participate in regular exercise at present. Smoker:  no  Health Maintenance: Pap:  12/21/13 Neg. 10/23/11 Neg. HR HPV:neg History of abnormal Pap:  no MMG:  09/15/14 BIRADS1:neg Colonoscopy:  09/06/08 Normal - repeat 10 years  BMD:  2006 TDaP:  12/2014 Screening Labs: PCP, Hb today: PCP, Urine today: PCP   reports that she has never smoked. She has never used smokeless tobacco. She reports that she does not drink alcohol or use illicit drugs.  Past Medical History  Diagnosis Date  . PONV (postoperative nausea and vomiting)   . Hypertension   . Anxiety   . Depression   . Asthma   . Sleep apnea     CPAP at bedtime  . Cancer [facility])     waldenstroms/ macroglobinulemia  . Hypercholesteremia   . Hypothyroidism   . Depression   . Macroglobulinemia ([place])     ? POEMS syndrome  . Acid reflux   . Multiple myeloma ([place])   . Diabetes mellitus without complication ([place])   . Obesity   . Sleep apnea     Past Surgical History  Procedure Laterality Date  . Cholecystectomy    . Back surgery  03/2004    herniation, L4-L5  . Bil laprascopic knee surgery    . Uterine fibroid embolization    . Bone marrow biopsy      2011  . Laparoscopic cholecystectomy  1997  . Foot surgery  1998  . Nasal sinus surgery  2004  . Ablation  09/2007    HTA and polyp resection  .  Knee arthroscopy w/ meniscal repair  10/10 ; 3/11  . Bone marrow biopsy  4/14    Current Outpatient Prescriptions  Medication Sig Dispense Refill  . acyclovir (ZOVIRAX) 400 MG tablet Take 1 tablet (400 mg total) by mouth 2 (two) times daily. 60 tablet 3  . ALPRAZolam (XANAX) 0.5 MG tablet Take 0.5 mg by mouth at bedtime as needed for anxiety.     [name] aspirin 81 MG tablet Take 81 mg by mouth daily.    . Azelastine HCl 0.15 % SOLN as needed.    . Blood Glucose Monitoring Suppl (ONE TOUCH ULTRA MINI) W/DEVICE KIT See admin instructions. Reported on 01/25/2015  0  . bortezomib IV (VELCADE) 3.5 MG injection Inject into the vein once.    . cetirizine (ZYRTEC) 10 MG tablet Take 10 mg by mouth at bedtime.     [name] dexamethasone (DECADRON) 4 MG tablet 10 tablet by mouth every week. Start with the first dose of chemotherapy. 40 tablet 3  . fexofenadine (ALLEGRA) 180 MG tablet Take 180 mg by mouth every morning.    . Fluticasone-Salmeterol (ADVAIR) 100-50 MCG/DOSE AEPB Inhale 2 puffs into the lungs every 12 (twelve) hours.    [name] ibuprofen (ADVIL,MOTRIN) 100  MG tablet Take 100 mg by mouth every 6 (six) hours as needed. Reported on 01/25/2015    . levothyroxine (SYNTHROID, LEVOTHROID) 150 MCG tablet Take 150 mcg by mouth daily before breakfast.    . metFORMIN (GLUCOPHAGE) 1000 MG tablet Take 1,000 mg by mouth 2 (two) times daily.  3  . NOVOLOG FLEXPEN 100 UNIT/ML FlexPen 3 Units daily as needed. Reported on 01/25/2015  6  . omeprazole (PRILOSEC) 40 MG capsule Take 40 mg by mouth 2 (two) times daily.     . ondansetron (ZOFRAN-ODT) 8 MG disintegrating tablet Take 8 mg by mouth every 8 (eight) hours as needed. Reported on 01/25/2015  2  . ONE TOUCH ULTRA TEST test strip See admin instructions. Reported on 01/25/2015  11  . pravastatin (PRAVACHOL) 40 MG tablet Take 40 mg by mouth every evening.     . sertraline (ZOLOFT) 50 MG tablet TAKE 3 TABLETS BY MOUTH EVERY DAY 270 tablet 4  . verapamil (CALAN-SR) 240 MG CR tablet  Take 240 mg by mouth 2 (two) times daily.    [name] EPIPEN 2-PAK 0.3 MG/0.3ML SOAJ injection Reported on 02/18/2015  1  . insulin glargine (LANTUS) 100 UNIT/ML injection Inject 8 Units into the skin 2 (two) times a week. Reported on 02/18/2015     No current facility-administered medications for this visit.    Family History  Problem Relation Age of Onset  . Heart disease Mother     heart cath  . Asthma Mother   . Endometriosis Mother     ROS:  Pertinent items are noted in HPI.  Otherwise, a comprehensive ROS was negative.  Exam:   BP 138/66 mmHg  Pulse 68  Resp 16  Ht _0  (1.702 m)  Wt 267 lb (121.11 kg)  BMI 41.81 kg/m2  LMP 12/31/2014  Weight change: -2#   Height: _1  (170.2 cm)  Ht Readings from Last 3 Encounters:  02/18/15 _2  (1.702 m)  02/15/15 _3  (1.702 m)  01/25/15 _4  (1.702 m)    General appearance: alert, cooperative and appears stated age Head: Normocephalic, without obvious abnormality, atraumatic Neck: no adenopathy, supple, symmetrical, trachea midline and thyroid normal to inspection and palpation Lungs: clear to auscultation bilaterally Breasts: normal appearance, no masses or tenderness Heart: regular rate and rhythm Abdomen: soft, non-tender; bowel sounds normal; no masses,  no organomegaly Extremities: extremities normal, atraumatic, no cyanosis or edema Skin: Skin color, texture, turgor normal. No rashes or lesions Lymph nodes: Cervical, supraclavicular, and axillary nodes normal. No abnormal inguinal nodes palpated Neurologic: Grossly normal   Pelvic: External genitalia:  no lesions              Urethra:  normal appearing urethra with no masses, tenderness or lesions              Bartholins and Skenes: normal                 Vagina: normal appearing vagina with normal color and discharge, no lesions              Cervix: no lesions              Pap taken: Yes.   Bimanual Exam:  Uterus:  normal size, contour, position, consistency, mobility,  non-tender              Adnexa: normal adnexa and no mass, fullness, tenderness               Rectovaginal:  Confirms               Anus:  normal sphincter tone, no lesions  Chaperone was present for exam.  A:  Well Woman with normal exam Perimenopausal with increased anxiety recently H/O HTA 9/09 Hypertension Elevated lipids Borderline BM Multiple myeloma. Under treatment with Dr. Julien Nordmann.  Yeast vaginitis  P: Mammogram yearly. Pap smear with neg HR HPV 2013. Pap today. On Zoloft 135m daily. Rx to pharmacy for 90 day supply and 4RF Xanax 0.557mpo prn.  #30/0.  Pt's old prescription is partly powder in her purse from years ago. Labs with oncologist and at work return annually or prn

## 2015-02-21 NOTE — Addendum Note (Signed)
Addended by: Megan Salon on: 02/21/2015 04:09 PM   Modules accepted: Orders, SmartSet

## 2015-02-22 ENCOUNTER — Other Ambulatory Visit (HOSPITAL_BASED_OUTPATIENT_CLINIC_OR_DEPARTMENT_OTHER): Payer: BLUE CROSS/BLUE SHIELD

## 2015-02-22 ENCOUNTER — Other Ambulatory Visit: Payer: BLUE CROSS/BLUE SHIELD

## 2015-02-22 ENCOUNTER — Ambulatory Visit (HOSPITAL_BASED_OUTPATIENT_CLINIC_OR_DEPARTMENT_OTHER): Payer: BLUE CROSS/BLUE SHIELD

## 2015-02-22 VITALS — BP 159/72 | HR 74 | Temp 98.1°F | Resp 16

## 2015-02-22 DIAGNOSIS — C9000 Multiple myeloma not having achieved remission: Secondary | ICD-10-CM

## 2015-02-22 DIAGNOSIS — Z5112 Encounter for antineoplastic immunotherapy: Secondary | ICD-10-CM

## 2015-02-22 LAB — COMPREHENSIVE METABOLIC PANEL WITH GFR
ALT: 56 U/L — ABNORMAL HIGH (ref 0–55)
AST: 41 U/L — ABNORMAL HIGH (ref 5–34)
Albumin: 3.8 g/dL (ref 3.5–5.0)
Alkaline Phosphatase: 92 U/L (ref 40–150)
Anion Gap: 12 meq/L — ABNORMAL HIGH (ref 3–11)
BUN: 8.9 mg/dL (ref 7.0–26.0)
CO2: 20 meq/L — ABNORMAL LOW (ref 22–29)
Calcium: 9.5 mg/dL (ref 8.4–10.4)
Chloride: 103 meq/L (ref 98–109)
Creatinine: 0.8 mg/dL (ref 0.6–1.1)
EGFR: 83 ml/min/1.73 m2 — ABNORMAL LOW (ref >90)
Glucose: 160 mg/dL — ABNORMAL HIGH (ref 70–140)
Potassium: 4 meq/L (ref 3.5–5.1)
Sodium: 136 meq/L (ref 136–145)
Total Bilirubin: 0.43 mg/dL (ref 0.20–1.20)
Total Protein: 7.2 g/dL (ref 6.4–8.3)

## 2015-02-22 LAB — CBC WITH DIFFERENTIAL/PLATELET
BASO%: 0.5 % (ref 0.0–2.0)
Basophils Absolute: 0 10e3/uL (ref 0.0–0.1)
EOS%: 1.3 % (ref 0.0–7.0)
Eosinophils Absolute: 0.1 10e3/uL (ref 0.0–0.5)
HCT: 33.4 % — ABNORMAL LOW (ref 34.8–46.6)
HGB: 11 g/dL — ABNORMAL LOW (ref 11.6–15.9)
LYMPH%: 13.9 % — ABNORMAL LOW (ref 14.0–49.7)
MCH: 27.5 pg (ref 25.1–34.0)
MCHC: 32.9 g/dL (ref 31.5–36.0)
MCV: 83.8 fL (ref 79.5–101.0)
MONO#: 0.5 10e3/uL (ref 0.1–0.9)
MONO%: 5.2 % (ref 0.0–14.0)
NEUT#: 7.2 10e3/uL — ABNORMAL HIGH (ref 1.5–6.5)
NEUT%: 79.1 % — ABNORMAL HIGH (ref 38.4–76.8)
Platelets: 189 10e3/uL (ref 145–400)
RBC: 3.98 10e6/uL (ref 3.70–5.45)
RDW: 15.7 % — ABNORMAL HIGH (ref 11.2–14.5)
WBC: 9.1 10e3/uL (ref 3.9–10.3)
lymph#: 1.3 10e3/uL (ref 0.9–3.3)

## 2015-02-22 MED ORDER — ONDANSETRON HCL 8 MG PO TABS
8.0000 mg | ORAL_TABLET | Freq: Once | ORAL | Status: AC
  Administered 2015-02-22: 8 mg via ORAL

## 2015-02-22 MED ORDER — BORTEZOMIB CHEMO SQ INJECTION 3.5 MG (2.5MG/ML)
1.3000 mg/m2 | Freq: Once | INTRAMUSCULAR | Status: AC
  Administered 2015-02-22: 3.25 mg via SUBCUTANEOUS
  Filled 2015-02-22: qty 3.25

## 2015-02-22 MED ORDER — ONDANSETRON HCL 8 MG PO TABS
ORAL_TABLET | ORAL | Status: AC
  Filled 2015-02-22: qty 1

## 2015-02-22 NOTE — Patient Instructions (Signed)
[facility] Discharge Instructions for Patients Receiving Chemotherapy  Today you received the following chemotherapy agents Velcade  To help prevent nausea and vomiting after your treatment, we encourage you to take your nausea medication Zofran 8 mg every 8 hours as needed.   If you develop nausea and vomiting that is not controlled by your nausea medication, call the clinic.   BELOW ARE SYMPTOMS THAT SHOULD BE REPORTED IMMEDIATELY:  *FEVER GREATER THAN 100.5 F  *CHILLS WITH OR WITHOUT FEVER  NAUSEA AND VOMITING THAT IS NOT CONTROLLED WITH YOUR NAUSEA MEDICATION  *UNUSUAL SHORTNESS OF BREATH  *UNUSUAL BRUISING OR BLEEDING  TENDERNESS IN MOUTH AND THROAT WITH OR WITHOUT PRESENCE OF ULCERS  *URINARY PROBLEMS  *BOWEL PROBLEMS  UNUSUAL RASH Items with * indicate a potential emergency and should be followed up as soon as possible.  Feel free to call the clinic you have any questions or concerns. The clinic phone number is [phone].  Please show the CHEMO ALERT CARD at check-in to the Emergency Department and triage nurse.   

## 2015-02-23 LAB — IPS PAP TEST WITH HPV

## 2015-03-01 ENCOUNTER — Ambulatory Visit: Payer: BLUE CROSS/BLUE SHIELD

## 2015-03-08 ENCOUNTER — Telehealth: Payer: Self-pay | Admitting: Internal Medicine

## 2015-03-08 ENCOUNTER — Other Ambulatory Visit: Payer: BLUE CROSS/BLUE SHIELD

## 2015-03-08 ENCOUNTER — Ambulatory Visit (HOSPITAL_BASED_OUTPATIENT_CLINIC_OR_DEPARTMENT_OTHER): Payer: BLUE CROSS/BLUE SHIELD

## 2015-03-08 ENCOUNTER — Encounter: Payer: Self-pay | Admitting: Internal Medicine

## 2015-03-08 ENCOUNTER — Other Ambulatory Visit (HOSPITAL_BASED_OUTPATIENT_CLINIC_OR_DEPARTMENT_OTHER): Payer: BLUE CROSS/BLUE SHIELD

## 2015-03-08 ENCOUNTER — Ambulatory Visit (HOSPITAL_BASED_OUTPATIENT_CLINIC_OR_DEPARTMENT_OTHER): Payer: BLUE CROSS/BLUE SHIELD | Admitting: Internal Medicine

## 2015-03-08 VITALS — BP 153/58 | HR 63 | Temp 98.4°F | Resp 18 | Ht 67.0 in | Wt 268.9 lb

## 2015-03-08 DIAGNOSIS — C9000 Multiple myeloma not having achieved remission: Secondary | ICD-10-CM

## 2015-03-08 DIAGNOSIS — Z5112 Encounter for antineoplastic immunotherapy: Secondary | ICD-10-CM | POA: Diagnosis not present

## 2015-03-08 DIAGNOSIS — R5383 Other fatigue: Secondary | ICD-10-CM

## 2015-03-08 LAB — CBC WITH DIFFERENTIAL/PLATELET
BASO%: 0.2 % (ref 0.0–2.0)
BASOS ABS: 0 10*3/uL (ref 0.0–0.1)
EOS%: 2.3 % (ref 0.0–7.0)
Eosinophils Absolute: 0.1 10*3/uL (ref 0.0–0.5)
HCT: 32.6 % — ABNORMAL LOW (ref 34.8–46.6)
HEMOGLOBIN: 10.5 g/dL — AB (ref 11.6–15.9)
LYMPH%: 16.9 % (ref 14.0–49.7)
MCH: 27.6 pg (ref 25.1–34.0)
MCHC: 32.2 g/dL (ref 31.5–36.0)
MCV: 85.6 fL (ref 79.5–101.0)
MONO#: 0.2 10*3/uL (ref 0.1–0.9)
MONO%: 4 % (ref 0.0–14.0)
NEUT#: 4.3 10*3/uL (ref 1.5–6.5)
NEUT%: 76.6 % (ref 38.4–76.8)
Platelets: 168 10*3/uL (ref 145–400)
RBC: 3.81 10*6/uL (ref 3.70–5.45)
RDW: 14.8 % — AB (ref 11.2–14.5)
WBC: 5.6 10*3/uL (ref 3.9–10.3)
lymph#: 0.9 10*3/uL (ref 0.9–3.3)

## 2015-03-08 LAB — COMPREHENSIVE METABOLIC PANEL
ALBUMIN: 3.7 g/dL (ref 3.5–5.0)
ALT: 56 U/L — ABNORMAL HIGH (ref 0–55)
AST: 52 U/L — AB (ref 5–34)
Alkaline Phosphatase: 100 U/L (ref 40–150)
Anion Gap: 11 mEq/L (ref 3–11)
BUN: 9.6 mg/dL (ref 7.0–26.0)
CHLORIDE: 105 meq/L (ref 98–109)
CO2: 21 meq/L — AB (ref 22–29)
Calcium: 9.2 mg/dL (ref 8.4–10.4)
Creatinine: 0.8 mg/dL (ref 0.6–1.1)
EGFR: 79 mL/min/{1.73_m2} — ABNORMAL LOW (ref >90)
GLUCOSE: 187 mg/dL — AB (ref 70–140)
POTASSIUM: 3.6 meq/L (ref 3.5–5.1)
SODIUM: 137 meq/L (ref 136–145)
Total Bilirubin: 0.43 mg/dL (ref 0.20–1.20)
Total Protein: 7 g/dL (ref 6.4–8.3)

## 2015-03-08 MED ORDER — ONDANSETRON HCL 8 MG PO TABS
ORAL_TABLET | ORAL | Status: AC
  Filled 2015-03-08: qty 1

## 2015-03-08 MED ORDER — BORTEZOMIB CHEMO SQ INJECTION 3.5 MG (2.5MG/ML)
1.3000 mg/m2 | Freq: Once | INTRAMUSCULAR | Status: AC
  Administered 2015-03-08: 3.25 mg via SUBCUTANEOUS
  Filled 2015-03-08: qty 3.25

## 2015-03-08 MED ORDER — ONDANSETRON HCL 8 MG PO TABS
8.0000 mg | ORAL_TABLET | Freq: Once | ORAL | Status: AC
  Administered 2015-03-08: 8 mg via ORAL

## 2015-03-08 NOTE — Patient Instructions (Signed)
[facility] Discharge Instructions for Patients Receiving Chemotherapy  Today you received the following chemotherapy agents Velcade  To help prevent nausea and vomiting after your treatment, we encourage you to take your nausea medication Zofran 8 mg every 8 hours as needed.   If you develop nausea and vomiting that is not controlled by your nausea medication, call the clinic.   BELOW ARE SYMPTOMS THAT SHOULD BE REPORTED IMMEDIATELY:  *FEVER GREATER THAN 100.5 F  *CHILLS WITH OR WITHOUT FEVER  NAUSEA AND VOMITING THAT IS NOT CONTROLLED WITH YOUR NAUSEA MEDICATION  *UNUSUAL SHORTNESS OF BREATH  *UNUSUAL BRUISING OR BLEEDING  TENDERNESS IN MOUTH AND THROAT WITH OR WITHOUT PRESENCE OF ULCERS  *URINARY PROBLEMS  *BOWEL PROBLEMS  UNUSUAL RASH Items with * indicate a potential emergency and should be followed up as soon as possible.  Feel free to call the clinic you have any questions or concerns. The clinic phone number is [phone].  Please show the CHEMO ALERT CARD at check-in to the Emergency Department and triage nurse.   

## 2015-03-08 NOTE — Telephone Encounter (Signed)
per pof to sch pt appt-pt sch already made out °

## 2015-03-08 NOTE — Progress Notes (Signed)
[facility] [phone]   [phone]  OFFICE PROGRESS NOTE  [name], MD [place] [address]  DIAGNOSIS: MGUS diagnosed in September 2010, with additional symptoms suggestive of POEMS syndrome.   PRIOR THERAPY: Velcade 1.3 MG/M2 subcutaneously with Decadron 40 mg by mouth on a weekly basis. First cycle 11/24/2013. She status post 31 weekly doses of treatment.   CURRENT THERAPY: The patient will resume treatment again with Velcade 1.3 MG/M2 subcutaneously and weekly basis with Decadron 40 mg by mouth weekly. First dose 02/01/2015. Status post 4 cycles.   INTERVAL HISTORY: Brenda Conner 55 y.o. female returns to the clinic today for followup visit. The patient is feeling fine today with no specific complaints except for fatigue for a few days after resuming her treatment with subcutaneous Velcade and Decadron. She was off treatment last week secondary to being out of town. She denied having any significant weight loss or night sweats. The patient denied having any fever or chills. She has no chest pain, shortness of breath, cough or hemoptysis. She is here today to start cycle #5.  MEDICAL HISTORY: Past Medical History  Diagnosis Date  . PONV (postoperative nausea and vomiting)   . Hypertension   . Anxiety   . Depression   . Asthma   . Sleep apnea     CPAP at bedtime  . Cancer [facility])     waldenstroms/ macroglobinulemia  . Hypercholesteremia   . Hypothyroidism   . Depression   . Macroglobulinemia ([place])     ? POEMS syndrome  . Acid reflux   . Multiple myeloma ([place])   . Diabetes mellitus without complication ([place])   . Obesity   . Sleep apnea     ALLERGIES:  is allergic to codeine; hydrocodone; lortab; onion; shellfish allergy; and amoxicillin.  MEDICATIONS:  Current Outpatient Prescriptions  Medication Sig Dispense Refill  . acyclovir (ZOVIRAX) 400 MG tablet Take 1 tablet (400 mg total) by mouth 2 (two)  times daily. 60 tablet 3  . ALPRAZolam (XANAX) 0.5 MG tablet Take 1 tablet (0.5 mg total) by mouth at bedtime as needed for anxiety. 30 tablet 0  . aspirin 81 MG tablet Take 81 mg by mouth daily.    . Azelastine HCl 0.15 % SOLN as needed.    . Blood Glucose Monitoring Suppl (ONE TOUCH ULTRA MINI) W/DEVICE KIT See admin instructions. Reported on 01/25/2015  0  . bortezomib IV (VELCADE) 3.5 MG injection Inject into the vein once.    . cetirizine (ZYRTEC) 10 MG tablet Take 10 mg by mouth at bedtime.     [name] dexamethasone (DECADRON) 4 MG tablet 10 tablet by mouth every week. Start with the first dose of chemotherapy. 40 tablet 3  . fexofenadine (ALLEGRA) 180 MG tablet Take 180 mg by mouth every morning.    . Fluticasone-Salmeterol (ADVAIR) 100-50 MCG/DOSE AEPB Inhale 2 puffs into the lungs every 12 (twelve) hours.    [name] ibuprofen (ADVIL,MOTRIN) 100 MG tablet Take 100 mg by mouth every 6 (six) hours as needed. Reported on 01/25/2015    . insulin glargine (LANTUS) 100 UNIT/ML injection Inject 8 Units into the skin 2 (two) times a week. Reported on 02/18/2015    . levothyroxine (SYNTHROID, LEVOTHROID) 150 MCG tablet Take 150 mcg by mouth daily before breakfast.    . metFORMIN (GLUCOPHAGE) 1000 MG tablet Take 1,000 mg by mouth 2 (two) times daily.  3  . NOVOLOG FLEXPEN 100 UNIT/ML FlexPen  3 Units daily as needed. Reported on 01/25/2015  6  . omeprazole (PRILOSEC) 40 MG capsule Take 40 mg by mouth 2 (two) times daily.     . ondansetron (ZOFRAN-ODT) 8 MG disintegrating tablet Take 8 mg by mouth every 8 (eight) hours as needed. Reported on 01/25/2015  2  . ONE TOUCH ULTRA TEST test strip See admin instructions. Reported on 01/25/2015  11  . pravastatin (PRAVACHOL) 40 MG tablet Take 40 mg by mouth every evening.     . sertraline (ZOLOFT) 50 MG tablet Take 3 tablets (150 mg total) by mouth daily. 270 tablet 4  . EPIPEN 2-PAK 0.3 MG/0.3ML SOAJ injection Reported on 03/08/2015  1  . verapamil (CALAN-SR) 240 MG CR tablet  Take 240 mg by mouth 2 (two) times daily.     No current facility-administered medications for this visit.    SURGICAL HISTORY:  Past Surgical History  Procedure Laterality Date  . Cholecystectomy    . Back surgery  03/2004    herniation, L4-L5  . Bil laprascopic knee surgery    . Uterine fibroid embolization    . Bone marrow biopsy      2011  . Laparoscopic cholecystectomy  1997  . Foot surgery  1998  . Nasal sinus surgery  2004  . Ablation  09/2007    HTA and polyp resection  . Knee arthroscopy w/ meniscal repair  10/10 ; 3/11  . Bone marrow biopsy  4/14    REVIEW OF SYSTEMS:  A comprehensive review of systems was negative except for: Constitutional: positive for fatigue   PHYSICAL EXAMINATION: General appearance: alert, cooperative and no distress Head: Normocephalic, without obvious abnormality, atraumatic Neck: no adenopathy Lymph nodes: Cervical, supraclavicular, and axillary nodes normal. Resp: clear to auscultation bilaterally Back: symmetric, no curvature. ROM normal. No CVA tenderness. Cardio: regular rate and rhythm, S1, S2 normal, no murmur, click, rub or gallop GI: soft, non-tender; bowel sounds normal; no masses,  no organomegaly Extremities: extremities normal, atraumatic, no cyanosis or edema Neurologic: Alert and oriented X 3, normal strength and tone. Normal symmetric reflexes. Normal coordination and gait  ECOG PERFORMANCE STATUS: 0 - Asymptomatic  Blood pressure 153/58, pulse 63, temperature 98.4 F (36.9 C), temperature source Oral, resp. rate 18, height 5' 7"  (1.702 m), weight 268 lb 14.4 oz (121.972 kg), last menstrual period 12/31/2014, SpO2 100 %.  LABORATORY DATA: Lab Results  Component Value Date   WBC 5.6 03/08/2015   HGB 10.5* 03/08/2015   HCT 32.6* 03/08/2015   MCV 85.6 03/08/2015   PLT 168 03/08/2015      Chemistry      Component Value Date/Time   NA 137 03/08/2015 1108   NA 137 03/20/2011 1509   K 3.6 03/08/2015 1108   K 3.4*  03/20/2011 1509   CL 104 04/07/2012 1415   CL 102 03/20/2011 1509   CO2 21* 03/08/2015 1108   CO2 26 03/20/2011 1509   BUN 9.6 03/08/2015 1108   BUN 7 03/20/2011 1509   CREATININE 0.8 03/08/2015 1108   CREATININE 0.63 03/20/2011 1509      Component Value Date/Time   CALCIUM 9.2 03/08/2015 1108   CALCIUM 9.2 03/20/2011 1509   ALKPHOS 100 03/08/2015 1108   ALKPHOS 74 03/20/2011 1509   AST 52* 03/08/2015 1108   AST 24 03/20/2011 1509   ALT 56* 03/08/2015 1108   ALT 26 03/20/2011 1509   BILITOT 0.43 03/08/2015 1108   BILITOT 0.3 03/20/2011 1509     Other  lab results: Beta-2 microglobulin 1.9, free kappa light chain 10.95, free lambda light chain 1456.16.10 with a kappa/lambda ratio of 0.01. IgG 476, IgA 87 and IgM 767.  ASSESSMENT AND PLAN: This is a very pleasant 55 years old white female with a multiple myeloma initially diagnosed as monoclonal gammopathy in addition to peripheral neuropathy, and Endocrinopathy in the form of hypothyroidism and skin rash. The symptoms combined are suspicious for POEMS syndrome.  She completed treatment with subcutaneous tenderness Velcade and Decadron status post 31 cycles and tolerated her treatment fairly well. She has been on observation for the last 6 months. 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 She resumed her treatment with subcutaneous Velcade and Decadron status post 4 cycles. She is tolerating the treatment well. I recommended for the patient to proceed with cycle #5 today as a scheduled.  The patient would come back for follow-up visit in 3 weeks for reevaluation. She was advised to call immediately if she has any concerning symptoms in the interval.  All questions were answered. The patient knows to call the clinic with any problems, questions or concerns. We can certainly see the patient much sooner if  necessary.  Disclaimer: This note was dictated with voice recognition software. Similar sounding words can inadvertently be transcribed and may not be corrected upon review.

## 2015-03-15 ENCOUNTER — Other Ambulatory Visit (HOSPITAL_BASED_OUTPATIENT_CLINIC_OR_DEPARTMENT_OTHER): Payer: BLUE CROSS/BLUE SHIELD

## 2015-03-15 ENCOUNTER — Ambulatory Visit (HOSPITAL_BASED_OUTPATIENT_CLINIC_OR_DEPARTMENT_OTHER): Payer: BLUE CROSS/BLUE SHIELD

## 2015-03-15 VITALS — BP 146/67 | HR 68 | Temp 98.2°F | Resp 16

## 2015-03-15 DIAGNOSIS — Z5112 Encounter for antineoplastic immunotherapy: Secondary | ICD-10-CM | POA: Diagnosis not present

## 2015-03-15 DIAGNOSIS — C9000 Multiple myeloma not having achieved remission: Secondary | ICD-10-CM

## 2015-03-15 LAB — CBC WITH DIFFERENTIAL/PLATELET
BASO%: 0.7 % (ref 0.0–2.0)
BASOS ABS: 0 10*3/uL (ref 0.0–0.1)
EOS ABS: 0.1 10*3/uL (ref 0.0–0.5)
EOS%: 2 % (ref 0.0–7.0)
HEMATOCRIT: 32 % — AB (ref 34.8–46.6)
HGB: 10.4 g/dL — ABNORMAL LOW (ref 11.6–15.9)
LYMPH%: 17.1 % (ref 14.0–49.7)
MCH: 27.1 pg (ref 25.1–34.0)
MCHC: 32.5 g/dL (ref 31.5–36.0)
MCV: 83.4 fL (ref 79.5–101.0)
MONO#: 0.4 10*3/uL (ref 0.1–0.9)
MONO%: 6.7 % (ref 0.0–14.0)
NEUT#: 4.3 10*3/uL (ref 1.5–6.5)
NEUT%: 73.5 % (ref 38.4–76.8)
Platelets: 161 10*3/uL (ref 145–400)
RBC: 3.84 10*6/uL (ref 3.70–5.45)
RDW: 15.2 % — ABNORMAL HIGH (ref 11.2–14.5)
WBC: 5.9 10*3/uL (ref 3.9–10.3)
lymph#: 1 10*3/uL (ref 0.9–3.3)

## 2015-03-15 LAB — COMPREHENSIVE METABOLIC PANEL
ALT: 39 U/L (ref 0–55)
AST: 34 U/L (ref 5–34)
Albumin: 3.6 g/dL (ref 3.5–5.0)
Alkaline Phosphatase: 88 U/L (ref 40–150)
Anion Gap: 11 mEq/L (ref 3–11)
BUN: 8.9 mg/dL (ref 7.0–26.0)
CHLORIDE: 104 meq/L (ref 98–109)
CO2: 23 mEq/L (ref 22–29)
Calcium: 9.2 mg/dL (ref 8.4–10.4)
Creatinine: 0.7 mg/dL (ref 0.6–1.1)
EGFR: >90 mL/min/{1.73_m2} (ref >90)
GLUCOSE: 117 mg/dL (ref 70–140)
POTASSIUM: 3.9 meq/L (ref 3.5–5.1)
SODIUM: 138 meq/L (ref 136–145)
Total Bilirubin: 0.5 mg/dL (ref 0.20–1.20)
Total Protein: 6.8 g/dL (ref 6.4–8.3)

## 2015-03-15 MED ORDER — ONDANSETRON HCL 8 MG PO TABS
ORAL_TABLET | ORAL | Status: AC
  Filled 2015-03-15: qty 1

## 2015-03-15 MED ORDER — BORTEZOMIB CHEMO SQ INJECTION 3.5 MG (2.5MG/ML)
1.3000 mg/m2 | Freq: Once | INTRAMUSCULAR | Status: AC
  Administered 2015-03-15: 3.25 mg via SUBCUTANEOUS
  Filled 2015-03-15: qty 3.25

## 2015-03-15 MED ORDER — ONDANSETRON HCL 8 MG PO TABS
8.0000 mg | ORAL_TABLET | Freq: Once | ORAL | Status: AC
  Administered 2015-03-15: 8 mg via ORAL

## 2015-03-15 NOTE — Patient Instructions (Signed)
[facility] Discharge Instructions for Patients Receiving Chemotherapy  Today you received the following chemotherapy agents velcade   To help prevent nausea and vomiting after your treatment, we encourage you to take your nausea medication as directed  If you develop nausea and vomiting that is not controlled by your nausea medication, call the clinic.   BELOW ARE SYMPTOMS THAT SHOULD BE REPORTED IMMEDIATELY:  *FEVER GREATER THAN 100.5 F  *CHILLS WITH OR WITHOUT FEVER  NAUSEA AND VOMITING THAT IS NOT CONTROLLED WITH YOUR NAUSEA MEDICATION  *UNUSUAL SHORTNESS OF BREATH  *UNUSUAL BRUISING OR BLEEDING  TENDERNESS IN MOUTH AND THROAT WITH OR WITHOUT PRESENCE OF ULCERS  *URINARY PROBLEMS  *BOWEL PROBLEMS  UNUSUAL RASH Items with * indicate a potential emergency and should be followed up as soon as possible.  Feel free to call the clinic you have any questions or concerns. The clinic phone number is [phone].  

## 2015-03-22 ENCOUNTER — Other Ambulatory Visit (HOSPITAL_BASED_OUTPATIENT_CLINIC_OR_DEPARTMENT_OTHER): Payer: BLUE CROSS/BLUE SHIELD

## 2015-03-22 ENCOUNTER — Ambulatory Visit (HOSPITAL_BASED_OUTPATIENT_CLINIC_OR_DEPARTMENT_OTHER): Payer: BLUE CROSS/BLUE SHIELD

## 2015-03-22 VITALS — BP 145/77 | HR 64 | Temp 98.0°F | Resp 18

## 2015-03-22 DIAGNOSIS — Z5112 Encounter for antineoplastic immunotherapy: Secondary | ICD-10-CM

## 2015-03-22 DIAGNOSIS — C9000 Multiple myeloma not having achieved remission: Secondary | ICD-10-CM

## 2015-03-22 LAB — COMPREHENSIVE METABOLIC PANEL
ALT: 39 U/L (ref 0–55)
ANION GAP: 10 meq/L (ref 3–11)
AST: 32 U/L (ref 5–34)
Albumin: 3.8 g/dL (ref 3.5–5.0)
Alkaline Phosphatase: 81 U/L (ref 40–150)
BILIRUBIN TOTAL: 0.41 mg/dL (ref 0.20–1.20)
BUN: 10.4 mg/dL (ref 7.0–26.0)
CHLORIDE: 104 meq/L (ref 98–109)
CO2: 24 meq/L (ref 22–29)
Calcium: 9.5 mg/dL (ref 8.4–10.4)
Creatinine: 0.8 mg/dL (ref 0.6–1.1)
EGFR: 84 mL/min/{1.73_m2} — AB (ref >90)
Glucose: 142 mg/dl — ABNORMAL HIGH (ref 70–140)
Potassium: 4.2 mEq/L (ref 3.5–5.1)
Sodium: 138 mEq/L (ref 136–145)
TOTAL PROTEIN: 6.9 g/dL (ref 6.4–8.3)

## 2015-03-22 LAB — CBC WITH DIFFERENTIAL/PLATELET
BASO%: 0.7 % (ref 0.0–2.0)
BASOS ABS: 0.1 10*3/uL (ref 0.0–0.1)
EOS ABS: 0.1 10*3/uL (ref 0.0–0.5)
EOS%: 1.5 % (ref 0.0–7.0)
HCT: 33.3 % — ABNORMAL LOW (ref 34.8–46.6)
HGB: 10.8 g/dL — ABNORMAL LOW (ref 11.6–15.9)
LYMPH%: 17.4 % (ref 14.0–49.7)
MCH: 27.1 pg (ref 25.1–34.0)
MCHC: 32.4 g/dL (ref 31.5–36.0)
MCV: 83.7 fL (ref 79.5–101.0)
MONO#: 0.4 10*3/uL (ref 0.1–0.9)
MONO%: 6.4 % (ref 0.0–14.0)
NEUT#: 5.1 10*3/uL (ref 1.5–6.5)
NEUT%: 74 % (ref 38.4–76.8)
PLATELETS: 153 10*3/uL (ref 145–400)
RBC: 3.98 10*6/uL (ref 3.70–5.45)
RDW: 15.7 % — ABNORMAL HIGH (ref 11.2–14.5)
WBC: 6.9 10*3/uL (ref 3.9–10.3)
lymph#: 1.2 10*3/uL (ref 0.9–3.3)

## 2015-03-22 MED ORDER — ONDANSETRON HCL 8 MG PO TABS
8.0000 mg | ORAL_TABLET | Freq: Once | ORAL | Status: AC
  Administered 2015-03-22: 8 mg via ORAL

## 2015-03-22 MED ORDER — BORTEZOMIB CHEMO SQ INJECTION 3.5 MG (2.5MG/ML)
1.3000 mg/m2 | Freq: Once | INTRAMUSCULAR | Status: AC
  Administered 2015-03-22: 3.25 mg via SUBCUTANEOUS
  Filled 2015-03-22: qty 3.25

## 2015-03-22 MED ORDER — ONDANSETRON HCL 8 MG PO TABS
ORAL_TABLET | ORAL | Status: AC
  Filled 2015-03-22: qty 1

## 2015-03-22 NOTE — Patient Instructions (Signed)
[facility] Discharge Instructions for Patients Receiving Chemotherapy  Today you received the following chemotherapy agent: Velcade.  To help prevent nausea and vomiting after your treatment, we encourage you to take your nausea medication, Zofran, as prescribed.   If you develop nausea and vomiting that is not controlled by your nausea medication, call the clinic.   BELOW ARE SYMPTOMS THAT SHOULD BE REPORTED IMMEDIATELY:  *FEVER GREATER THAN 100.5 F  *CHILLS WITH OR WITHOUT FEVER  NAUSEA AND VOMITING THAT IS NOT CONTROLLED WITH YOUR NAUSEA MEDICATION  *UNUSUAL SHORTNESS OF BREATH  *UNUSUAL BRUISING OR BLEEDING  TENDERNESS IN MOUTH AND THROAT WITH OR WITHOUT PRESENCE OF ULCERS  *URINARY PROBLEMS  *BOWEL PROBLEMS  UNUSUAL RASH Items with * indicate a potential emergency and should be followed up as soon as possible.  Feel free to call the clinic you have any questions or concerns. The clinic phone number is [phone].  Please show the [facility] at check-in to the Emergency Department and triage nurse.

## 2015-03-29 ENCOUNTER — Encounter: Payer: Self-pay | Admitting: Internal Medicine

## 2015-03-29 ENCOUNTER — Ambulatory Visit (HOSPITAL_BASED_OUTPATIENT_CLINIC_OR_DEPARTMENT_OTHER): Payer: BLUE CROSS/BLUE SHIELD

## 2015-03-29 ENCOUNTER — Telehealth: Payer: Self-pay | Admitting: Internal Medicine

## 2015-03-29 ENCOUNTER — Ambulatory Visit (HOSPITAL_BASED_OUTPATIENT_CLINIC_OR_DEPARTMENT_OTHER): Payer: BLUE CROSS/BLUE SHIELD | Admitting: Internal Medicine

## 2015-03-29 ENCOUNTER — Other Ambulatory Visit (HOSPITAL_BASED_OUTPATIENT_CLINIC_OR_DEPARTMENT_OTHER): Payer: BLUE CROSS/BLUE SHIELD

## 2015-03-29 VITALS — BP 157/62 | HR 74 | Temp 97.7°F | Resp 18 | Ht 67.0 in | Wt 267.5 lb

## 2015-03-29 DIAGNOSIS — E8809 Other disorders of plasma-protein metabolism, not elsewhere classified: Secondary | ICD-10-CM

## 2015-03-29 DIAGNOSIS — Z5112 Encounter for antineoplastic immunotherapy: Secondary | ICD-10-CM | POA: Diagnosis not present

## 2015-03-29 DIAGNOSIS — C9000 Multiple myeloma not having achieved remission: Secondary | ICD-10-CM

## 2015-03-29 LAB — COMPREHENSIVE METABOLIC PANEL
ALK PHOS: 83 U/L (ref 40–150)
ALT: 45 U/L (ref 0–55)
ANION GAP: 12 meq/L — AB (ref 3–11)
AST: 41 U/L — AB (ref 5–34)
Albumin: 3.6 g/dL (ref 3.5–5.0)
BUN: 7.3 mg/dL (ref 7.0–26.0)
CO2: 23 mEq/L (ref 22–29)
Calcium: 9.3 mg/dL (ref 8.4–10.4)
Chloride: 104 mEq/L (ref 98–109)
Creatinine: 0.8 mg/dL (ref 0.6–1.1)
EGFR: 87 mL/min/{1.73_m2} — ABNORMAL LOW (ref >90)
GLUCOSE: 155 mg/dL — AB (ref 70–140)
Potassium: 4.2 mEq/L (ref 3.5–5.1)
Sodium: 139 mEq/L (ref 136–145)
TOTAL PROTEIN: 6.8 g/dL (ref 6.4–8.3)
Total Bilirubin: 0.5 mg/dL (ref 0.20–1.20)

## 2015-03-29 LAB — CBC WITH DIFFERENTIAL/PLATELET
BASO%: 0.2 % (ref 0.0–2.0)
BASOS ABS: 0 10*3/uL (ref 0.0–0.1)
EOS ABS: 0.1 10*3/uL (ref 0.0–0.5)
EOS%: 1.8 % (ref 0.0–7.0)
HEMATOCRIT: 32.4 % — AB (ref 34.8–46.6)
HGB: 10.5 g/dL — ABNORMAL LOW (ref 11.6–15.9)
LYMPH%: 14.1 % (ref 14.0–49.7)
MCH: 27.7 pg (ref 25.1–34.0)
MCHC: 32.4 g/dL (ref 31.5–36.0)
MCV: 85.5 fL (ref 79.5–101.0)
MONO#: 0.4 10*3/uL (ref 0.1–0.9)
MONO%: 7.7 % (ref 0.0–14.0)
NEUT%: 76.2 % (ref 38.4–76.8)
NEUTROS ABS: 4.3 10*3/uL (ref 1.5–6.5)
PLATELETS: 140 10*3/uL — AB (ref 145–400)
RBC: 3.79 10*6/uL (ref 3.70–5.45)
RDW: 15.2 % — ABNORMAL HIGH (ref 11.2–14.5)
WBC: 5.6 10*3/uL (ref 3.9–10.3)
lymph#: 0.8 10*3/uL — ABNORMAL LOW (ref 0.9–3.3)

## 2015-03-29 MED ORDER — ONDANSETRON HCL 8 MG PO TABS
ORAL_TABLET | ORAL | Status: AC
  Filled 2015-03-29: qty 1

## 2015-03-29 MED ORDER — BORTEZOMIB CHEMO SQ INJECTION 3.5 MG (2.5MG/ML)
1.3000 mg/m2 | Freq: Once | INTRAMUSCULAR | Status: AC
  Administered 2015-03-29: 3.25 mg via SUBCUTANEOUS
  Filled 2015-03-29: qty 3.25

## 2015-03-29 MED ORDER — ONDANSETRON HCL 8 MG PO TABS
8.0000 mg | ORAL_TABLET | Freq: Once | ORAL | Status: AC
  Administered 2015-03-29: 8 mg via ORAL

## 2015-03-29 NOTE — Progress Notes (Signed)
[place] [phone]   Fax:([phone]  OFFICE PROGRESS NOTE  Brenda Kern, MD [place] [address]  DIAGNOSIS: MGUS diagnosed in September 2010, with additional symptoms suggestive of POEMS syndrome.   PRIOR THERAPY: Velcade 1.3 MG/M2 subcutaneously with Decadron 40 mg by mouth on a weekly basis. First cycle 11/24/2013. She status post 31 weekly doses of treatment.   CURRENT THERAPY: The patient will resume treatment again with Velcade 1.3 MG/M2 subcutaneously and weekly basis with Decadron 40 mg by mouth weekly. First dose 02/01/2015. Status post 7 cycles.   INTERVAL HISTORY: Brenda Conner 55 y.o. female returns to the clinic today for followup visit accompanied by her mother. The patient is feeling fine today with no specific complaints. She is tolerating her treatment with subcutaneous Velcade and Decadron fairly well with no significant complaints. She denied having any significant weight loss or night sweats. The patient denied having any fever or chills. She has no chest pain, shortness of breath, cough or hemoptysis. She is here today to start cycle #8.  MEDICAL HISTORY: Past Medical History  Diagnosis Date  . PONV (postoperative nausea and vomiting)   . Hypertension   . Anxiety   . Depression   . Asthma   . Sleep apnea     CPAP at bedtime  . Cancer [facility])     waldenstroms/ macroglobinulemia  . Hypercholesteremia   . Hypothyroidism   . Depression   . Macroglobulinemia ([place])     ? POEMS syndrome  . Acid reflux   . Multiple myeloma ([place])   . Diabetes mellitus without complication ([place])   . Obesity   . Sleep apnea     ALLERGIES:  is allergic to codeine; hydrocodone; lortab; onion; shellfish allergy; and amoxicillin.  MEDICATIONS:  Current Outpatient Prescriptions  Medication Sig Dispense Refill  . acyclovir (ZOVIRAX) 400 MG tablet Take 1 tablet (400 mg total) by mouth 2 (two) times daily. 60 tablet 3  .  ALPRAZolam (XANAX) 0.5 MG tablet Take 1 tablet (0.5 mg total) by mouth at bedtime as needed for anxiety. 30 tablet 0  . aspirin 81 MG tablet Take 81 mg by mouth daily.    . Azelastine HCl 0.15 % SOLN as needed.    . Blood Glucose Monitoring Suppl (ONE TOUCH ULTRA MINI) W/DEVICE KIT See admin instructions. Reported on 01/25/2015  0  . bortezomib IV (VELCADE) 3.5 MG injection Inject into the vein once.    . cetirizine (ZYRTEC) 10 MG tablet Take 10 mg by mouth at bedtime.     [name] dexamethasone (DECADRON) 4 MG tablet 10 tablet by mouth every week. Start with the first dose of chemotherapy. 40 tablet 3  . EPIPEN 2-PAK 0.3 MG/0.3ML SOAJ injection Reported on 03/08/2015  1  . fexofenadine (ALLEGRA) 180 MG tablet Take 180 mg by mouth every morning.    . Fluticasone-Salmeterol (ADVAIR) 100-50 MCG/DOSE AEPB Inhale 2 puffs into the lungs every 12 (twelve) hours.    [name] ibuprofen (ADVIL,MOTRIN) 100 MG tablet Take 100 mg by mouth every 6 (six) hours as needed. Reported on 01/25/2015    . insulin glargine (LANTUS) 100 UNIT/ML injection Inject 8 Units into the skin 2 (two) times a week. Reported on 02/18/2015    . levothyroxine (SYNTHROID, LEVOTHROID) 150 MCG tablet Take 150 mcg by mouth daily before breakfast.    . metFORMIN (GLUCOPHAGE) 1000 MG tablet Take 1,000 mg by mouth 2 (two) times daily.  3  .  NOVOLOG FLEXPEN 100 UNIT/ML FlexPen 3 Units daily as needed. Reported on 01/25/2015  6  . omeprazole (PRILOSEC) 40 MG capsule Take 40 mg by mouth 2 (two) times daily.     . ondansetron (ZOFRAN-ODT) 8 MG disintegrating tablet Take 8 mg by mouth every 8 (eight) hours as needed. Reported on 01/25/2015  2  . ONE TOUCH ULTRA TEST test strip See admin instructions. Reported on 01/25/2015  11  . pravastatin (PRAVACHOL) 40 MG tablet Take 40 mg by mouth every evening.     . sertraline (ZOLOFT) 50 MG tablet Take 3 tablets (150 mg total) by mouth daily. 270 tablet 4  . verapamil (CALAN-SR) 240 MG CR tablet Take 240 mg by mouth 2 (two)  times daily.     No current facility-administered medications for this visit.    SURGICAL HISTORY:  Past Surgical History  Procedure Laterality Date  . Cholecystectomy    . Back surgery  03/2004    herniation, L4-L5  . Bil laprascopic knee surgery    . Uterine fibroid embolization    . Bone marrow biopsy      2011  . Laparoscopic cholecystectomy  1997  . Foot surgery  1998  . Nasal sinus surgery  2004  . Ablation  09/2007    HTA and polyp resection  . Knee arthroscopy w/ meniscal repair  10/10 ; 3/11  . Bone marrow biopsy  4/14    REVIEW OF SYSTEMS:  A comprehensive review of systems was negative.   PHYSICAL EXAMINATION: General appearance: alert, cooperative and no distress Head: Normocephalic, without obvious abnormality, atraumatic Neck: no adenopathy Lymph nodes: Cervical, supraclavicular, and axillary nodes normal. Resp: clear to auscultation bilaterally Back: symmetric, no curvature. ROM normal. No CVA tenderness. Cardio: regular rate and rhythm, S1, S2 normal, no murmur, click, rub or gallop GI: soft, non-tender; bowel sounds normal; no masses,  no organomegaly Extremities: extremities normal, atraumatic, no cyanosis or edema Neurologic: Alert and oriented X 3, normal strength and tone. Normal symmetric reflexes. Normal coordination and gait  ECOG PERFORMANCE STATUS: 0 - Asymptomatic  Blood pressure 157/62, pulse 74, temperature 97.7 F (36.5 C), temperature source Oral, resp. rate 18, height 5' 7"  (1.702 m), weight 267 lb 8 oz (121.337 kg), SpO2 99 %.  LABORATORY DATA: Lab Results  Component Value Date   WBC 5.6 03/29/2015   HGB 10.5* 03/29/2015   HCT 32.4* 03/29/2015   MCV 85.5 03/29/2015   PLT 140* 03/29/2015      Chemistry      Component Value Date/Time   NA 138 03/22/2015 0908   NA 137 03/20/2011 1509   K 4.2 03/22/2015 0908   K 3.4* 03/20/2011 1509   CL 104 04/07/2012 1415   CL 102 03/20/2011 1509   CO2 24 03/22/2015 0908   CO2 26 03/20/2011  1509   BUN 10.4 03/22/2015 0908   BUN 7 03/20/2011 1509   CREATININE 0.8 03/22/2015 0908   CREATININE 0.63 03/20/2011 1509      Component Value Date/Time   CALCIUM 9.5 03/22/2015 0908   CALCIUM 9.2 03/20/2011 1509   ALKPHOS 81 03/22/2015 0908   ALKPHOS 74 03/20/2011 1509   AST 32 03/22/2015 0908   AST 24 03/20/2011 1509   ALT 39 03/22/2015 0908   ALT 26 03/20/2011 1509   BILITOT 0.41 03/22/2015 0908   BILITOT 0.3 03/20/2011 1509     Other lab results: Beta-2 microglobulin 1.9, free kappa light chain 10.95, free lambda light chain 1456.16.10 with a kappa/lambda  ratio of 0.01. IgG 476, IgA 87 and IgM 767.  ASSESSMENT AND PLAN: This is a very pleasant 55 years old white female with a multiple myeloma initially diagnosed as monoclonal gammopathy in addition to peripheral neuropathy, and Endocrinopathy in the form of hypothyroidism and skin rash. The symptoms combined are suspicious for POEMS syndrome.  She completed treatment with subcutaneous tenderness Velcade and Decadron status post 31 cycles and tolerated her treatment fairly well. She has been on observation for the last 6 months. 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 She resumed her treatment with subcutaneous Velcade and Decadron status post 7 cycles. She is tolerating the treatment well. I recommended for the patient to proceed with cycle #8 today as a scheduled.  The patient would come back for follow-up visit in 3 weeks for reevaluation after repeating myeloma panel. She was advised to call immediately if she has any concerning symptoms in the interval.  All questions were answered. The patient knows to call the clinic with any problems, questions or concerns. We can certainly see the patient much sooner if necessary.  Disclaimer: This note was dictated with voice recognition software. Similar sounding words can  inadvertently be transcribed and may not be corrected upon review.

## 2015-03-29 NOTE — Telephone Encounter (Signed)
per pof to sch pt appt-pt req no avs needed appts already sch-

## 2015-03-29 NOTE — Patient Instructions (Signed)
Bortezomib injection What is this medicine? BORTEZOMIB (bor TEZ oh mib) is a medicine that targets proteins in cancer cells and stops the cancer cells from growing. It is used to treat multiple myeloma and mantle-cell lymphoma. This medicine may be used for other purposes; ask your health care provider or pharmacist if you have questions. What should I tell my health care provider before I take this medicine? They need to know if you have any of these conditions: -diabetes -heart disease -irregular heartbeat -liver disease -on hemodialysis -low blood counts, like low white blood cells, platelets, or hemoglobin -peripheral neuropathy -taking medicine for blood pressure -an unusual or allergic reaction to bortezomib, mannitol, boron, other medicines, foods, dyes, or preservatives -pregnant or trying to get pregnant -breast-feeding How should I use this medicine? This medicine is for injection into a vein or for injection under the skin. It is given by a health care professional in a hospital or clinic setting. Talk to your pediatrician regarding the use of this medicine in children. Special care may be needed. Overdosage: If you think you have taken too much of this medicine contact a poison control center or emergency room at once. NOTE: This medicine is only for you. Do not share this medicine with others. What if I miss a dose? It is important not to miss your dose. Call your doctor or health care professional if you are unable to keep an appointment. What may interact with this medicine? This medicine may interact with the following medications: -ketoconazole -rifampin -ritonavir -St. John's Wort This list may not describe all possible interactions. Give your health care provider a list of all the medicines, herbs, non-prescription drugs, or dietary supplements you use. Also tell them if you smoke, drink alcohol, or use illegal drugs. Some items may interact with your medicine. What  should I watch for while using this medicine? Visit your doctor for checks on your progress. This drug may make you feel generally unwell. This is not uncommon, as chemotherapy can affect healthy cells as well as cancer cells. Report any side effects. Continue your course of treatment even though you feel ill unless your doctor tells you to stop. You may get drowsy or dizzy. Do not drive, use machinery, or do anything that needs mental alertness until you know how this medicine affects you. Do not stand or sit up quickly, especially if you are an older patient. This reduces the risk of dizzy or fainting spells. In some cases, you may be given additional medicines to help with side effects. Follow all directions for their use. Call your doctor or health care professional for advice if you get a fever, chills or sore throat, or other symptoms of a cold or flu. Do not treat yourself. This drug decreases your body's ability to fight infections. Try to avoid being around people who are sick. This medicine may increase your risk to bruise or bleed. Call your doctor or health care professional if you notice any unusual bleeding. You may need blood work done while you are taking this medicine. In some patients, this medicine may cause a serious brain infection that may cause death. If you have any problems seeing, thinking, speaking, walking, or standing, tell your doctor right away. If you cannot reach your doctor, urgently seek other source of medical care. Do not become pregnant while taking this medicine. Women should inform their doctor if they wish to become pregnant or think they might be pregnant. There is a potential for serious  side effects to an unborn child. Talk to your health care professional or pharmacist for more information. Do not breast-feed an infant while taking this medicine. Check with your doctor or health care professional if you get an attack of severe diarrhea, nausea and vomiting, or if  you sweat a lot. The loss of too much body fluid can make it dangerous for you to take this medicine. What side effects may I notice from receiving this medicine? Side effects that you should report to your doctor or health care professional as soon as possible: -allergic reactions like skin rash, itching or hives, swelling of the face, lips, or tongue -breathing problems -changes in hearing -changes in vision -fast, irregular heartbeat -feeling faint or lightheaded, falls -pain, tingling, numbness in the hands or feet -right upper belly pain -seizures -swelling of the ankles, feet, hands -unusual bleeding or bruising -unusually weak or tired -vomiting -yellowing of the eyes or skin Side effects that usually do not require medical attention (report to your doctor or health care professional if they continue or are bothersome): -changes in emotions or moods -constipation -diarrhea -loss of appetite -headache -irritation at site where injected -nausea This list may not describe all possible side effects. Call your doctor for medical advice about side effects. You may report side effects to FDA at 1-800-FDA-1088. Where should I keep my medicine? This drug is given in a hospital or clinic and will not be stored at home. NOTE: This sheet is a summary. It may not cover all possible information. If you have questions about this medicine, talk to your doctor, pharmacist, or health care provider.    2016, Elsevier/Gold Standard. (2014-02-23 14:47:04)

## 2015-04-05 ENCOUNTER — Other Ambulatory Visit: Payer: Self-pay | Admitting: Medical Oncology

## 2015-04-05 ENCOUNTER — Ambulatory Visit (HOSPITAL_BASED_OUTPATIENT_CLINIC_OR_DEPARTMENT_OTHER): Payer: BLUE CROSS/BLUE SHIELD

## 2015-04-05 ENCOUNTER — Other Ambulatory Visit: Payer: BLUE CROSS/BLUE SHIELD

## 2015-04-05 ENCOUNTER — Ambulatory Visit: Payer: BLUE CROSS/BLUE SHIELD | Admitting: Internal Medicine

## 2015-04-05 ENCOUNTER — Other Ambulatory Visit (HOSPITAL_BASED_OUTPATIENT_CLINIC_OR_DEPARTMENT_OTHER): Payer: BLUE CROSS/BLUE SHIELD

## 2015-04-05 VITALS — BP 152/69 | HR 69 | Temp 98.7°F | Resp 18

## 2015-04-05 DIAGNOSIS — C9000 Multiple myeloma not having achieved remission: Secondary | ICD-10-CM

## 2015-04-05 DIAGNOSIS — Z5112 Encounter for antineoplastic immunotherapy: Secondary | ICD-10-CM

## 2015-04-05 LAB — CBC WITH DIFFERENTIAL/PLATELET
BASO%: 0.9 % (ref 0.0–2.0)
BASOS ABS: 0.1 10*3/uL (ref 0.0–0.1)
EOS%: 2.6 % (ref 0.0–7.0)
Eosinophils Absolute: 0.2 10*3/uL (ref 0.0–0.5)
HCT: 31.5 % — ABNORMAL LOW (ref 34.8–46.6)
HGB: 10.2 g/dL — ABNORMAL LOW (ref 11.6–15.9)
LYMPH%: 15.2 % (ref 14.0–49.7)
MCH: 27.2 pg (ref 25.1–34.0)
MCHC: 32.3 g/dL (ref 31.5–36.0)
MCV: 84.3 fL (ref 79.5–101.0)
MONO#: 0.3 10*3/uL (ref 0.1–0.9)
MONO%: 5.6 % (ref 0.0–14.0)
NEUT#: 4.4 10*3/uL (ref 1.5–6.5)
NEUT%: 75.7 % (ref 38.4–76.8)
Platelets: 143 10*3/uL — ABNORMAL LOW (ref 145–400)
RBC: 3.74 10*6/uL (ref 3.70–5.45)
RDW: 16.2 % — ABNORMAL HIGH (ref 11.2–14.5)
WBC: 5.8 10*3/uL (ref 3.9–10.3)
lymph#: 0.9 10*3/uL (ref 0.9–3.3)

## 2015-04-05 LAB — COMPREHENSIVE METABOLIC PANEL
ALT: 52 U/L (ref 0–55)
ANION GAP: 11 meq/L (ref 3–11)
AST: 49 U/L — AB (ref 5–34)
Albumin: 3.4 g/dL — ABNORMAL LOW (ref 3.5–5.0)
Alkaline Phosphatase: 88 U/L (ref 40–150)
BUN: 9.7 mg/dL (ref 7.0–26.0)
CHLORIDE: 104 meq/L (ref 98–109)
CO2: 22 meq/L (ref 22–29)
CREATININE: 0.8 mg/dL (ref 0.6–1.1)
Calcium: 9.1 mg/dL (ref 8.4–10.4)
EGFR: 90 mL/min/{1.73_m2} — ABNORMAL LOW (ref >90)
Glucose: 166 mg/dl — ABNORMAL HIGH (ref 70–140)
POTASSIUM: 4 meq/L (ref 3.5–5.1)
Sodium: 137 mEq/L (ref 136–145)
Total Bilirubin: 0.41 mg/dL (ref 0.20–1.20)
Total Protein: 6.6 g/dL (ref 6.4–8.3)

## 2015-04-05 MED ORDER — ONDANSETRON HCL 8 MG PO TABS
8.0000 mg | ORAL_TABLET | Freq: Once | ORAL | Status: AC
  Administered 2015-04-05: 8 mg via ORAL

## 2015-04-05 MED ORDER — ONDANSETRON HCL 8 MG PO TABS
ORAL_TABLET | ORAL | Status: AC
  Filled 2015-04-05: qty 1

## 2015-04-05 MED ORDER — BORTEZOMIB CHEMO SQ INJECTION 3.5 MG (2.5MG/ML)
1.3000 mg/m2 | Freq: Once | INTRAMUSCULAR | Status: AC
  Administered 2015-04-05: 3.25 mg via SUBCUTANEOUS
  Filled 2015-04-05: qty 3.25

## 2015-04-05 NOTE — Patient Instructions (Signed)
[facility] Discharge Instructions for Patients Receiving Chemotherapy  Today you received the following chemotherapy agent: Velcade.  To help prevent nausea and vomiting after your treatment, we encourage you to take your nausea medication, Zofran, as prescribed.   If you develop nausea and vomiting that is not controlled by your nausea medication, call the clinic.   BELOW ARE SYMPTOMS THAT SHOULD BE REPORTED IMMEDIATELY:  *FEVER GREATER THAN 100.5 F  *CHILLS WITH OR WITHOUT FEVER  NAUSEA AND VOMITING THAT IS NOT CONTROLLED WITH YOUR NAUSEA MEDICATION  *UNUSUAL SHORTNESS OF BREATH  *UNUSUAL BRUISING OR BLEEDING  TENDERNESS IN MOUTH AND THROAT WITH OR WITHOUT PRESENCE OF ULCERS  *URINARY PROBLEMS  *BOWEL PROBLEMS  UNUSUAL RASH Items with * indicate a potential emergency and should be followed up as soon as possible.  Feel free to call the clinic you have any questions or concerns. The clinic phone number is [phone].  Please show the [facility] at check-in to the Emergency Department and triage nurse.

## 2015-04-12 ENCOUNTER — Ambulatory Visit (HOSPITAL_BASED_OUTPATIENT_CLINIC_OR_DEPARTMENT_OTHER): Payer: BLUE CROSS/BLUE SHIELD

## 2015-04-12 ENCOUNTER — Other Ambulatory Visit (HOSPITAL_BASED_OUTPATIENT_CLINIC_OR_DEPARTMENT_OTHER): Payer: BLUE CROSS/BLUE SHIELD

## 2015-04-12 VITALS — BP 148/65 | HR 61 | Temp 97.7°F | Resp 18

## 2015-04-12 DIAGNOSIS — Z5112 Encounter for antineoplastic immunotherapy: Secondary | ICD-10-CM | POA: Diagnosis not present

## 2015-04-12 DIAGNOSIS — E8809 Other disorders of plasma-protein metabolism, not elsewhere classified: Secondary | ICD-10-CM

## 2015-04-12 DIAGNOSIS — C9000 Multiple myeloma not having achieved remission: Secondary | ICD-10-CM | POA: Diagnosis not present

## 2015-04-12 DIAGNOSIS — Z95828 Presence of other vascular implants and grafts: Secondary | ICD-10-CM

## 2015-04-12 LAB — COMPREHENSIVE METABOLIC PANEL
ALBUMIN: 3.6 g/dL (ref 3.5–5.0)
ALK PHOS: 88 U/L (ref 40–150)
ALT: 64 U/L — ABNORMAL HIGH (ref 0–55)
AST: 52 U/L — ABNORMAL HIGH (ref 5–34)
Anion Gap: 10 mEq/L (ref 3–11)
BUN: 9.7 mg/dL (ref 7.0–26.0)
CALCIUM: 9.3 mg/dL (ref 8.4–10.4)
CO2: 25 mEq/L (ref 22–29)
Chloride: 102 mEq/L (ref 98–109)
Creatinine: 0.7 mg/dL (ref 0.6–1.1)
Glucose: 144 mg/dl — ABNORMAL HIGH (ref 70–140)
POTASSIUM: 4 meq/L (ref 3.5–5.1)
SODIUM: 137 meq/L (ref 136–145)
Total Bilirubin: 0.44 mg/dL (ref 0.20–1.20)
Total Protein: 6.9 g/dL (ref 6.4–8.3)

## 2015-04-12 LAB — CBC WITH DIFFERENTIAL/PLATELET
BASO%: 0.2 % (ref 0.0–2.0)
Basophils Absolute: 0 10*3/uL (ref 0.0–0.1)
EOS%: 1.1 % (ref 0.0–7.0)
Eosinophils Absolute: 0.1 10*3/uL (ref 0.0–0.5)
HEMATOCRIT: 31.2 % — AB (ref 34.8–46.6)
HGB: 10.1 g/dL — ABNORMAL LOW (ref 11.6–15.9)
LYMPH#: 0.9 10*3/uL (ref 0.9–3.3)
LYMPH%: 17.4 % (ref 14.0–49.7)
MCH: 27.5 pg (ref 25.1–34.0)
MCHC: 32.4 g/dL (ref 31.5–36.0)
MCV: 85 fL (ref 79.5–101.0)
MONO#: 0.3 10*3/uL (ref 0.1–0.9)
MONO%: 6.3 % (ref 0.0–14.0)
NEUT#: 4 10*3/uL (ref 1.5–6.5)
NEUT%: 75 % (ref 38.4–76.8)
PLATELETS: 130 10*3/uL — AB (ref 145–400)
RBC: 3.67 10*6/uL — AB (ref 3.70–5.45)
RDW: 15.2 % — ABNORMAL HIGH (ref 11.2–14.5)
WBC: 5.4 10*3/uL (ref 3.9–10.3)

## 2015-04-12 LAB — LACTATE DEHYDROGENASE: LDH: 153 U/L (ref 125–245)

## 2015-04-12 MED ORDER — HEPARIN SOD (PORK) LOCK FLUSH 100 UNIT/ML IV SOLN
500.0000 [IU] | Freq: Once | INTRAVENOUS | Status: DC
  Filled 2015-04-12: qty 5

## 2015-04-12 MED ORDER — BORTEZOMIB CHEMO SQ INJECTION 3.5 MG (2.5MG/ML)
1.3000 mg/m2 | Freq: Once | INTRAMUSCULAR | Status: AC
  Administered 2015-04-12: 3.25 mg via SUBCUTANEOUS
  Filled 2015-04-12: qty 3.25

## 2015-04-12 MED ORDER — SODIUM CHLORIDE 0.9% FLUSH
10.0000 mL | INTRAVENOUS | Status: DC | PRN
  Filled 2015-04-12: qty 10

## 2015-04-12 MED ORDER — ONDANSETRON HCL 8 MG PO TABS
ORAL_TABLET | ORAL | Status: AC
  Filled 2015-04-12: qty 1

## 2015-04-12 MED ORDER — ONDANSETRON HCL 8 MG PO TABS
8.0000 mg | ORAL_TABLET | Freq: Once | ORAL | Status: AC
  Administered 2015-04-12: 8 mg via ORAL

## 2015-04-12 NOTE — Progress Notes (Signed)
Patient declined AVS 

## 2015-04-12 NOTE — Patient Instructions (Signed)
[facility] Discharge Instructions for Patients Receiving Chemotherapy  Today you received the following chemotherapy agent: Velcade.  To help prevent nausea and vomiting after your treatment, we encourage you to take your nausea medication, Zofran, as prescribed.   If you develop nausea and vomiting that is not controlled by your nausea medication, call the clinic.   BELOW ARE SYMPTOMS THAT SHOULD BE REPORTED IMMEDIATELY:  *FEVER GREATER THAN 100.5 F  *CHILLS WITH OR WITHOUT FEVER  NAUSEA AND VOMITING THAT IS NOT CONTROLLED WITH YOUR NAUSEA MEDICATION  *UNUSUAL SHORTNESS OF BREATH  *UNUSUAL BRUISING OR BLEEDING  TENDERNESS IN MOUTH AND THROAT WITH OR WITHOUT PRESENCE OF ULCERS  *URINARY PROBLEMS  *BOWEL PROBLEMS  UNUSUAL RASH Items with * indicate a potential emergency and should be followed up as soon as possible.  Feel free to call the clinic you have any questions or concerns. The clinic phone number is [phone].  Please show the [facility] at check-in to the Emergency Department and triage nurse.

## 2015-04-13 LAB — KAPPA/LAMBDA LIGHT CHAINS
IG KAPPA FREE LIGHT CHAIN: 9.36 mg/L (ref 3.30–19.40)
Ig Lambda Free Light Chain: 1014.02 mg/L — ABNORMAL HIGH (ref 5.71–26.30)
KAPPA/LAMBDA FLC RATIO: 0.01 — AB (ref 0.26–1.65)

## 2015-04-13 LAB — IGG, IGA, IGM
IGM (IMMUNOGLOBIN M), SRM: 604 mg/dL — AB (ref 26–217)
IgA, Qn, Serum: 79 mg/dL — ABNORMAL LOW (ref 87–352)
IgG, Qn, Serum: 354 mg/dL — ABNORMAL LOW (ref 700–1600)

## 2015-04-13 LAB — BETA 2 MICROGLOBULIN, SERUM: Beta-2: 1.7 mg/L (ref 0.6–2.4)

## 2015-04-18 ENCOUNTER — Other Ambulatory Visit: Payer: Self-pay | Admitting: Medical Oncology

## 2015-04-18 DIAGNOSIS — C9000 Multiple myeloma not having achieved remission: Secondary | ICD-10-CM

## 2015-04-19 ENCOUNTER — Telehealth: Payer: Self-pay | Admitting: Internal Medicine

## 2015-04-19 ENCOUNTER — Ambulatory Visit (HOSPITAL_BASED_OUTPATIENT_CLINIC_OR_DEPARTMENT_OTHER): Payer: BLUE CROSS/BLUE SHIELD

## 2015-04-19 ENCOUNTER — Other Ambulatory Visit (HOSPITAL_BASED_OUTPATIENT_CLINIC_OR_DEPARTMENT_OTHER): Payer: BLUE CROSS/BLUE SHIELD

## 2015-04-19 ENCOUNTER — Ambulatory Visit (HOSPITAL_BASED_OUTPATIENT_CLINIC_OR_DEPARTMENT_OTHER): Payer: BLUE CROSS/BLUE SHIELD | Admitting: Internal Medicine

## 2015-04-19 ENCOUNTER — Encounter: Payer: Self-pay | Admitting: Internal Medicine

## 2015-04-19 VITALS — BP 157/72 | HR 80 | Temp 98.4°F | Resp 18 | Ht 67.0 in | Wt 269.0 lb

## 2015-04-19 DIAGNOSIS — E8809 Other disorders of plasma-protein metabolism, not elsewhere classified: Secondary | ICD-10-CM

## 2015-04-19 DIAGNOSIS — C9000 Multiple myeloma not having achieved remission: Secondary | ICD-10-CM

## 2015-04-19 DIAGNOSIS — Z5112 Encounter for antineoplastic immunotherapy: Secondary | ICD-10-CM

## 2015-04-19 LAB — CBC WITH DIFFERENTIAL/PLATELET
BASO%: 0.5 % (ref 0.0–2.0)
Basophils Absolute: 0 10*3/uL (ref 0.0–0.1)
EOS%: 1.4 % (ref 0.0–7.0)
Eosinophils Absolute: 0.1 10*3/uL (ref 0.0–0.5)
HEMATOCRIT: 32.2 % — AB (ref 34.8–46.6)
HEMOGLOBIN: 10.3 g/dL — AB (ref 11.6–15.9)
LYMPH#: 1 10*3/uL (ref 0.9–3.3)
LYMPH%: 16.4 % (ref 14.0–49.7)
MCH: 27 pg (ref 25.1–34.0)
MCHC: 32.1 g/dL (ref 31.5–36.0)
MCV: 84.1 fL (ref 79.5–101.0)
MONO#: 0.4 10*3/uL (ref 0.1–0.9)
MONO%: 6 % (ref 0.0–14.0)
NEUT#: 4.8 10*3/uL (ref 1.5–6.5)
NEUT%: 75.7 % (ref 38.4–76.8)
Platelets: 149 10*3/uL (ref 145–400)
RBC: 3.83 10*6/uL (ref 3.70–5.45)
RDW: 16.4 % — ABNORMAL HIGH (ref 11.2–14.5)
WBC: 6.3 10*3/uL (ref 3.9–10.3)

## 2015-04-19 LAB — COMPREHENSIVE METABOLIC PANEL
ALBUMIN: 3.7 g/dL (ref 3.5–5.0)
ALT: 70 U/L — AB (ref 0–55)
AST: 59 U/L — AB (ref 5–34)
Alkaline Phosphatase: 91 U/L (ref 40–150)
Anion Gap: 12 mEq/L — ABNORMAL HIGH (ref 3–11)
BUN: 10.8 mg/dL (ref 7.0–26.0)
CALCIUM: 9.5 mg/dL (ref 8.4–10.4)
CHLORIDE: 102 meq/L (ref 98–109)
CO2: 23 mEq/L (ref 22–29)
CREATININE: 0.8 mg/dL (ref 0.6–1.1)
EGFR: 83 mL/min/{1.73_m2} — ABNORMAL LOW (ref >90)
Glucose: 151 mg/dl — ABNORMAL HIGH (ref 70–140)
Potassium: 4.2 mEq/L (ref 3.5–5.1)
Sodium: 137 mEq/L (ref 136–145)
Total Bilirubin: 0.46 mg/dL (ref 0.20–1.20)
Total Protein: 6.9 g/dL (ref 6.4–8.3)

## 2015-04-19 MED ORDER — BORTEZOMIB CHEMO SQ INJECTION 3.5 MG (2.5MG/ML)
1.3000 mg/m2 | Freq: Once | INTRAMUSCULAR | Status: AC
  Administered 2015-04-19: 3.25 mg via SUBCUTANEOUS
  Filled 2015-04-19: qty 3.25

## 2015-04-19 MED ORDER — ONDANSETRON HCL 8 MG PO TABS
ORAL_TABLET | ORAL | Status: AC
  Filled 2015-04-19: qty 1

## 2015-04-19 MED ORDER — ONDANSETRON HCL 8 MG PO TABS
8.0000 mg | ORAL_TABLET | Freq: Once | ORAL | Status: AC
  Administered 2015-04-19: 8 mg via ORAL

## 2015-04-19 NOTE — Telephone Encounter (Signed)
Gave and printed appt sched and avs for pt for April and May °

## 2015-04-19 NOTE — Progress Notes (Signed)
[place] Telephone:([phone]   Fax:(336) [phone]  OFFICE PROGRESS NOTE  Brenda Kern, MD [place] [address]  DIAGNOSIS: MGUS diagnosed in September 2010, with additional symptoms suggestive of POEMS syndrome.   PRIOR THERAPY: Velcade 1.3 MG/M2 subcutaneously with Decadron 40 mg by mouth on a weekly basis. First cycle 11/24/2013. She status post 31 weekly doses of treatment.   CURRENT THERAPY: The patient will resume treatment again with Velcade 1.3 MG/M2 subcutaneously and weekly basis with Decadron 40 mg by mouth weekly. First dose 02/01/2015. Status post 10 cycles.   INTERVAL HISTORY: Brenda Conner 55 y.o. female returns to the clinic today for followup visit. The patient is feeling fine today with no specific complaints. She is tolerating her treatment with subcutaneous Velcade and Decadron fairly well with no significant complaints. She denied having any significant weight loss or night sweats. The patient denied having any fever or chills. She has no chest pain, shortness of breath, cough or hemoptysis. She had repeat myeloma panel performed recently and she is here for evaluation and discussion of her lab results.  MEDICAL HISTORY: Past Medical History  Diagnosis Date  . PONV (postoperative nausea and vomiting)   . Hypertension   . Anxiety   . Depression   . Asthma   . Sleep apnea     CPAP at bedtime  . Cancer [facility])     waldenstroms/ macroglobinulemia  . Hypercholesteremia   . Hypothyroidism   . Depression   . Macroglobulinemia ([place])     ? POEMS syndrome  . Acid reflux   . Multiple myeloma ([place])   . Diabetes mellitus without complication ([place])   . Obesity   . Sleep apnea     ALLERGIES:  is allergic to codeine; hydrocodone; lortab; onion; shellfish allergy; and amoxicillin.  MEDICATIONS:  Current Outpatient Prescriptions  Medication Sig Dispense Refill  . acyclovir (ZOVIRAX) 400 MG tablet Take 1 tablet (400 mg  total) by mouth 2 (two) times daily. 60 tablet 3  . ALPRAZolam (XANAX) 0.5 MG tablet Take 1 tablet (0.5 mg total) by mouth at bedtime as needed for anxiety. 30 tablet 0  . aspirin 81 MG tablet Take 81 mg by mouth daily.    . Azelastine HCl 0.15 % SOLN as needed.    . Blood Glucose Monitoring Suppl (ONE TOUCH ULTRA MINI) W/DEVICE KIT See admin instructions. Reported on 01/25/2015  0  . bortezomib IV (VELCADE) 3.5 MG injection Inject into the vein once.    Brenda Conner dexamethasone (DECADRON) 4 MG tablet 10 tablet by mouth every week. Start with the first dose of chemotherapy. 40 tablet 3  . EPIPEN 2-PAK 0.3 MG/0.3ML SOAJ injection Reported on 03/08/2015  1  . Fluticasone-Salmeterol (ADVAIR) 100-50 MCG/DOSE AEPB Inhale 2 puffs into the lungs every 12 (twelve) hours.    Brenda Conner ibuprofen (ADVIL,MOTRIN) 100 MG tablet Take 100 mg by mouth every 6 (six) hours as needed. Reported on 01/25/2015    . insulin glargine (LANTUS) 100 UNIT/ML injection Inject 8 Units into the skin 2 (two) times a week. Reported on 02/18/2015    . levocetirizine (XYZAL) 5 MG tablet Take 5 mg by mouth every evening.    Brenda Conner levothyroxine (SYNTHROID, LEVOTHROID) 150 MCG tablet Take 150 mcg by mouth daily before breakfast.    . metFORMIN (GLUCOPHAGE) 1000 MG tablet Take 1,000 mg by mouth 2 (two) times daily.  3  . NOVOLOG FLEXPEN 100 UNIT/ML FlexPen 3 Units daily as needed.  Reported on 01/25/2015  6  . omeprazole (PRILOSEC) 40 MG capsule Take 40 mg by mouth 2 (two) times daily.     . ondansetron (ZOFRAN-ODT) 8 MG disintegrating tablet Take 8 mg by mouth every 8 (eight) hours as needed. Reported on 01/25/2015  2  . ONE TOUCH ULTRA TEST test strip See admin instructions. Reported on 01/25/2015  11  . pravastatin (PRAVACHOL) 40 MG tablet Take 40 mg by mouth every evening.     . sertraline (ZOLOFT) 50 MG tablet Take 3 tablets (150 mg total) by mouth daily. 270 tablet 4  . verapamil (CALAN-SR) 240 MG CR tablet Take 240 mg by mouth 2 (two) times daily.     No  current facility-administered medications for this visit.    SURGICAL HISTORY:  Past Surgical History  Procedure Laterality Date  . Cholecystectomy    . Back surgery  03/2004    herniation, L4-L5  . Bil laprascopic knee surgery    . Uterine fibroid embolization    . Bone marrow biopsy      2011  . Laparoscopic cholecystectomy  1997  . Foot surgery  1998  . Nasal sinus surgery  2004  . Ablation  09/2007    HTA and polyp resection  . Knee arthroscopy w/ meniscal repair  10/10 ; 3/11  . Bone marrow biopsy  4/14    REVIEW OF SYSTEMS:  Constitutional: negative Eyes: negative Ears, nose, mouth, throat, and face: negative Respiratory: negative Cardiovascular: negative Gastrointestinal: negative Genitourinary:negative Integument/breast: negative Hematologic/lymphatic: negative Musculoskeletal:negative Neurological: negative Behavioral/Psych: negative Endocrine: negative Allergic/Immunologic: negative   PHYSICAL EXAMINATION: General appearance: alert, cooperative and no distress Head: Normocephalic, without obvious abnormality, atraumatic Neck: no adenopathy Lymph nodes: Cervical, supraclavicular, and axillary nodes normal. Resp: clear to auscultation bilaterally Back: symmetric, no curvature. ROM normal. No CVA tenderness. Cardio: regular rate and rhythm, S1, S2 normal, no murmur, click, rub or gallop GI: soft, non-tender; bowel sounds normal; no masses,  no organomegaly Extremities: extremities normal, atraumatic, no cyanosis or edema Neurologic: Alert and oriented X 3, normal strength and tone. Normal symmetric reflexes. Normal coordination and gait  ECOG PERFORMANCE STATUS: 0 - Asymptomatic  Blood pressure 157/72, pulse 80, temperature 98.4 F (36.9 C), temperature source Oral, resp. rate 18, height 5' 7"  (1.702 m), weight 269 lb (122.018 kg), SpO2 100 %.  LABORATORY DATA: Lab Results  Component Value Date   WBC 6.3 04/19/2015   HGB 10.3* 04/19/2015   HCT 32.2*  04/19/2015   MCV 84.1 04/19/2015   PLT 149 04/19/2015      Chemistry      Component Value Date/Time   NA 137 04/12/2015 0905   NA 137 03/20/2011 1509   K 4.0 04/12/2015 0905   K 3.4* 03/20/2011 1509   CL 104 04/07/2012 1415   CL 102 03/20/2011 1509   CO2 25 04/12/2015 0905   CO2 26 03/20/2011 1509   BUN 9.7 04/12/2015 0905   BUN 7 03/20/2011 1509   CREATININE 0.7 04/12/2015 0905   CREATININE 0.63 03/20/2011 1509      Component Value Date/Time   CALCIUM 9.3 04/12/2015 0905   CALCIUM 9.2 03/20/2011 1509   ALKPHOS 88 04/12/2015 0905   ALKPHOS 74 03/20/2011 1509   AST 52* 04/12/2015 0905   AST 24 03/20/2011 1509   ALT 64* 04/12/2015 0905   ALT 26 03/20/2011 1509   BILITOT 0.44 04/12/2015 0905   BILITOT 0.3 03/20/2011 1509     Other lab results: Beta-2 microglobulin 1.7, free  kappa light chain 9.36, free lambda light chain 1014.02 with a kappa/lambda ratio of 0.01. IgG 354, IgA 79 and IgM 604.  ASSESSMENT AND PLAN: This is a very pleasant 55 years old white female with a multiple myeloma initially diagnosed as monoclonal gammopathy in addition to peripheral neuropathy, and Endocrinopathy in the form of hypothyroidism and skin rash. The symptoms combined are suspicious for POEMS syndrome.  She completed treatment with subcutaneous tenderness Velcade and Decadron status post 31 cycles and tolerated her treatment fairly well. She has been on observation for the last 6 months. 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 She resumed her treatment with subcutaneous Velcade and Decadron status post 10 cycles. She is tolerating the treatment well. Her myeloma panel showed improvement of her disease. I discussed the lab result with the patient today. I recommended for the patient to proceed with cycle #11 of her treatment with Velcade and Decadron today as a scheduled.  The patient  would come back for follow-up visit in 3 weeks for reevaluation with repeat blood work. She was advised to call immediately if she has any concerning symptoms in the interval.  All questions were answered. The patient knows to call the clinic with any problems, questions or concerns. We can certainly see the patient much sooner if necessary.  Disclaimer: This note was dictated with voice recognition software. Similar sounding words can inadvertently be transcribed and may not be corrected upon review.

## 2015-04-19 NOTE — Patient Instructions (Signed)
[facility] Discharge Instructions for Patients Receiving Chemotherapy  Today you received the following chemotherapy agent: Velcade.  To help prevent nausea and vomiting after your treatment, we encourage you to take your nausea medication, Zofran, as prescribed.   If you develop nausea and vomiting that is not controlled by your nausea medication, call the clinic.   BELOW ARE SYMPTOMS THAT SHOULD BE REPORTED IMMEDIATELY:  *FEVER GREATER THAN 100.5 F  *CHILLS WITH OR WITHOUT FEVER  NAUSEA AND VOMITING THAT IS NOT CONTROLLED WITH YOUR NAUSEA MEDICATION  *UNUSUAL SHORTNESS OF BREATH  *UNUSUAL BRUISING OR BLEEDING  TENDERNESS IN MOUTH AND THROAT WITH OR WITHOUT PRESENCE OF ULCERS  *URINARY PROBLEMS  *BOWEL PROBLEMS  UNUSUAL RASH Items with * indicate a potential emergency and should be followed up as soon as possible.  Feel free to call the clinic you have any questions or concerns. The clinic phone number is [phone].  Please show the [facility] at check-in to the Emergency Department and triage nurse.

## 2015-04-25 ENCOUNTER — Other Ambulatory Visit: Payer: Self-pay | Admitting: *Deleted

## 2015-04-25 DIAGNOSIS — C9000 Multiple myeloma not having achieved remission: Secondary | ICD-10-CM

## 2015-04-26 ENCOUNTER — Ambulatory Visit (HOSPITAL_BASED_OUTPATIENT_CLINIC_OR_DEPARTMENT_OTHER): Payer: BLUE CROSS/BLUE SHIELD

## 2015-04-26 ENCOUNTER — Other Ambulatory Visit (HOSPITAL_BASED_OUTPATIENT_CLINIC_OR_DEPARTMENT_OTHER): Payer: BLUE CROSS/BLUE SHIELD

## 2015-04-26 VITALS — BP 139/64 | HR 66 | Temp 98.1°F | Resp 16

## 2015-04-26 DIAGNOSIS — C9000 Multiple myeloma not having achieved remission: Secondary | ICD-10-CM

## 2015-04-26 DIAGNOSIS — Z5112 Encounter for antineoplastic immunotherapy: Secondary | ICD-10-CM | POA: Diagnosis not present

## 2015-04-26 LAB — CBC WITH DIFFERENTIAL/PLATELET
BASO%: 0 % (ref 0.0–2.0)
Basophils Absolute: 0 10*3/uL (ref 0.0–0.1)
EOS ABS: 0.1 10*3/uL (ref 0.0–0.5)
EOS%: 1.4 % (ref 0.0–7.0)
HCT: 30.8 % — ABNORMAL LOW (ref 34.8–46.6)
HGB: 9.8 g/dL — ABNORMAL LOW (ref 11.6–15.9)
LYMPH%: 15.3 % (ref 14.0–49.7)
MCH: 27.3 pg (ref 25.1–34.0)
MCHC: 31.8 g/dL (ref 31.5–36.0)
MCV: 85.8 fL (ref 79.5–101.0)
MONO#: 0.3 10*3/uL (ref 0.1–0.9)
MONO%: 5.3 % (ref 0.0–14.0)
NEUT%: 78 % — ABNORMAL HIGH (ref 38.4–76.8)
NEUTROS ABS: 4.4 10*3/uL (ref 1.5–6.5)
Platelets: 136 10*3/uL — ABNORMAL LOW (ref 145–400)
RBC: 3.59 10*6/uL — AB (ref 3.70–5.45)
RDW: 15.7 % — ABNORMAL HIGH (ref 11.2–14.5)
WBC: 5.6 10*3/uL (ref 3.9–10.3)
lymph#: 0.9 10*3/uL (ref 0.9–3.3)

## 2015-04-26 LAB — COMPREHENSIVE METABOLIC PANEL
ALT: 60 U/L — ABNORMAL HIGH (ref 0–55)
AST: 52 U/L — ABNORMAL HIGH (ref 5–34)
Albumin: 3.6 g/dL (ref 3.5–5.0)
Alkaline Phosphatase: 88 U/L (ref 40–150)
Anion Gap: 11 mEq/L (ref 3–11)
BUN: 8.9 mg/dL (ref 7.0–26.0)
CO2: 23 meq/L (ref 22–29)
Calcium: 9.4 mg/dL (ref 8.4–10.4)
Chloride: 105 mEq/L (ref 98–109)
Creatinine: 0.7 mg/dL (ref 0.6–1.1)
GLUCOSE: 137 mg/dL (ref 70–140)
POTASSIUM: 3.8 meq/L (ref 3.5–5.1)
SODIUM: 138 meq/L (ref 136–145)
TOTAL PROTEIN: 6.8 g/dL (ref 6.4–8.3)
Total Bilirubin: 0.53 mg/dL (ref 0.20–1.20)

## 2015-04-26 MED ORDER — ONDANSETRON HCL 8 MG PO TABS
8.0000 mg | ORAL_TABLET | Freq: Once | ORAL | Status: AC
  Administered 2015-04-26: 8 mg via ORAL

## 2015-04-26 MED ORDER — BORTEZOMIB CHEMO SQ INJECTION 3.5 MG (2.5MG/ML)
1.3000 mg/m2 | Freq: Once | INTRAMUSCULAR | Status: AC
  Administered 2015-04-26: 3.25 mg via SUBCUTANEOUS
  Filled 2015-04-26: qty 3.25

## 2015-04-26 MED ORDER — ONDANSETRON HCL 8 MG PO TABS
ORAL_TABLET | ORAL | Status: AC
  Filled 2015-04-26: qty 1

## 2015-04-26 NOTE — Patient Instructions (Signed)
Bortezomib injection What is this medicine? BORTEZOMIB (bor TEZ oh mib) is a medicine that targets proteins in cancer cells and stops the cancer cells from growing. It is used to treat multiple myeloma and mantle-cell lymphoma. This medicine may be used for other purposes; ask your health care provider or pharmacist if you have questions. What should I tell my health care provider before I take this medicine? They need to know if you have any of these conditions: -diabetes -heart disease -irregular heartbeat -liver disease -on hemodialysis -low blood counts, like low white blood cells, platelets, or hemoglobin -peripheral neuropathy -taking medicine for blood pressure -an unusual or allergic reaction to bortezomib, mannitol, boron, other medicines, foods, dyes, or preservatives -pregnant or trying to get pregnant -breast-feeding How should I use this medicine? This medicine is for injection into a vein or for injection under the skin. It is given by a health care professional in a hospital or clinic setting. Talk to your pediatrician regarding the use of this medicine in children. Special care may be needed. Overdosage: If you think you have taken too much of this medicine contact a poison control center or emergency room at once. NOTE: This medicine is only for you. Do not share this medicine with others. What if I miss a dose? It is important not to miss your dose. Call your doctor or health care professional if you are unable to keep an appointment. What may interact with this medicine? This medicine may interact with the following medications: -ketoconazole -rifampin -ritonavir -St. John's Wort This list may not describe all possible interactions. Give your health care provider a list of all the medicines, herbs, non-prescription drugs, or dietary supplements you use. Also tell them if you smoke, drink alcohol, or use illegal drugs. Some items may interact with your medicine. What  should I watch for while using this medicine? Visit your doctor for checks on your progress. This drug may make you feel generally unwell. This is not uncommon, as chemotherapy can affect healthy cells as well as cancer cells. Report any side effects. Continue your course of treatment even though you feel ill unless your doctor tells you to stop. You may get drowsy or dizzy. Do not drive, use machinery, or do anything that needs mental alertness until you know how this medicine affects you. Do not stand or sit up quickly, especially if you are an older patient. This reduces the risk of dizzy or fainting spells. In some cases, you may be given additional medicines to help with side effects. Follow all directions for their use. Call your doctor or health care professional for advice if you get a fever, chills or sore throat, or other symptoms of a cold or flu. Do not treat yourself. This drug decreases your body's ability to fight infections. Try to avoid being around people who are sick. This medicine may increase your risk to bruise or bleed. Call your doctor or health care professional if you notice any unusual bleeding. You may need blood work done while you are taking this medicine. In some patients, this medicine may cause a serious brain infection that may cause death. If you have any problems seeing, thinking, speaking, walking, or standing, tell your doctor right away. If you cannot reach your doctor, urgently seek other source of medical care. Do not become pregnant while taking this medicine. Women should inform their doctor if they wish to become pregnant or think they might be pregnant. There is a potential for serious  side effects to an unborn child. Talk to your health care professional or pharmacist for more information. Do not breast-feed an infant while taking this medicine. Check with your doctor or health care professional if you get an attack of severe diarrhea, nausea and vomiting, or if  you sweat a lot. The loss of too much body fluid can make it dangerous for you to take this medicine. What side effects may I notice from receiving this medicine? Side effects that you should report to your doctor or health care professional as soon as possible: -allergic reactions like skin rash, itching or hives, swelling of the face, lips, or tongue -breathing problems -changes in hearing -changes in vision -fast, irregular heartbeat -feeling faint or lightheaded, falls -pain, tingling, numbness in the hands or feet -right upper belly pain -seizures -swelling of the ankles, feet, hands -unusual bleeding or bruising -unusually weak or tired -vomiting -yellowing of the eyes or skin Side effects that usually do not require medical attention (report to your doctor or health care professional if they continue or are bothersome): -changes in emotions or moods -constipation -diarrhea -loss of appetite -headache -irritation at site where injected -nausea This list may not describe all possible side effects. Call your doctor for medical advice about side effects. You may report side effects to FDA at 1-800-FDA-1088. Where should I keep my medicine? This drug is given in a hospital or clinic and will not be stored at home. NOTE: This sheet is a summary. It may not cover all possible information. If you have questions about this medicine, talk to your doctor, pharmacist, or health care provider.    2016, Elsevier/Gold Standard. (2014-02-23 14:47:04)

## 2015-05-02 ENCOUNTER — Other Ambulatory Visit: Payer: Self-pay | Admitting: Medical Oncology

## 2015-05-02 DIAGNOSIS — C9000 Multiple myeloma not having achieved remission: Secondary | ICD-10-CM

## 2015-05-03 ENCOUNTER — Ambulatory Visit (HOSPITAL_BASED_OUTPATIENT_CLINIC_OR_DEPARTMENT_OTHER): Payer: BLUE CROSS/BLUE SHIELD

## 2015-05-03 ENCOUNTER — Other Ambulatory Visit (HOSPITAL_BASED_OUTPATIENT_CLINIC_OR_DEPARTMENT_OTHER): Payer: BLUE CROSS/BLUE SHIELD

## 2015-05-03 VITALS — BP 144/68 | HR 63 | Temp 97.0°F | Resp 18

## 2015-05-03 DIAGNOSIS — Z5112 Encounter for antineoplastic immunotherapy: Secondary | ICD-10-CM

## 2015-05-03 DIAGNOSIS — C9000 Multiple myeloma not having achieved remission: Secondary | ICD-10-CM

## 2015-05-03 LAB — COMPREHENSIVE METABOLIC PANEL
ALBUMIN: 3.6 g/dL (ref 3.5–5.0)
ALT: 54 U/L (ref 0–55)
ANION GAP: 11 meq/L (ref 3–11)
AST: 48 U/L — AB (ref 5–34)
Alkaline Phosphatase: 89 U/L (ref 40–150)
BUN: 8.3 mg/dL (ref 7.0–26.0)
CALCIUM: 9.4 mg/dL (ref 8.4–10.4)
CO2: 23 mEq/L (ref 22–29)
Chloride: 105 mEq/L (ref 98–109)
Creatinine: 0.7 mg/dL (ref 0.6–1.1)
EGFR: >90 mL/min/{1.73_m2} (ref >90)
Glucose: 141 mg/dl — ABNORMAL HIGH (ref 70–140)
POTASSIUM: 4 meq/L (ref 3.5–5.1)
Sodium: 139 mEq/L (ref 136–145)
Total Bilirubin: 0.48 mg/dL (ref 0.20–1.20)
Total Protein: 6.7 g/dL (ref 6.4–8.3)

## 2015-05-03 LAB — CBC WITH DIFFERENTIAL/PLATELET
BASO%: 0.6 % (ref 0.0–2.0)
BASOS ABS: 0 10*3/uL (ref 0.0–0.1)
EOS ABS: 0.1 10*3/uL (ref 0.0–0.5)
EOS%: 1.5 % (ref 0.0–7.0)
HEMATOCRIT: 33.1 % — AB (ref 34.8–46.6)
HEMOGLOBIN: 10.7 g/dL — AB (ref 11.6–15.9)
LYMPH#: 1 10*3/uL (ref 0.9–3.3)
LYMPH%: 16.4 % (ref 14.0–49.7)
MCH: 27.2 pg (ref 25.1–34.0)
MCHC: 32.2 g/dL (ref 31.5–36.0)
MCV: 84.5 fL (ref 79.5–101.0)
MONO#: 0.4 10*3/uL (ref 0.1–0.9)
MONO%: 6.1 % (ref 0.0–14.0)
NEUT#: 4.5 10*3/uL (ref 1.5–6.5)
NEUT%: 75.4 % (ref 38.4–76.8)
PLATELETS: 151 10*3/uL (ref 145–400)
RBC: 3.92 10*6/uL (ref 3.70–5.45)
RDW: 16.4 % — AB (ref 11.2–14.5)
WBC: 6 10*3/uL (ref 3.9–10.3)

## 2015-05-03 MED ORDER — ONDANSETRON HCL 8 MG PO TABS
8.0000 mg | ORAL_TABLET | Freq: Once | ORAL | Status: AC
  Administered 2015-05-03: 8 mg via ORAL

## 2015-05-03 MED ORDER — BORTEZOMIB CHEMO SQ INJECTION 3.5 MG (2.5MG/ML)
1.3000 mg/m2 | Freq: Once | INTRAMUSCULAR | Status: AC
  Administered 2015-05-03: 3.25 mg via SUBCUTANEOUS
  Filled 2015-05-03: qty 3.25

## 2015-05-03 MED ORDER — ONDANSETRON HCL 8 MG PO TABS
ORAL_TABLET | ORAL | Status: AC
  Filled 2015-05-03: qty 1

## 2015-05-03 NOTE — Patient Instructions (Signed)
Bortezomib injection What is this medicine? BORTEZOMIB (bor TEZ oh mib) is a medicine that targets proteins in cancer cells and stops the cancer cells from growing. It is used to treat multiple myeloma and mantle-cell lymphoma. This medicine may be used for other purposes; ask your health care provider or pharmacist if you have questions. What should I tell my health care provider before I take this medicine? They need to know if you have any of these conditions: -diabetes -heart disease -irregular heartbeat -liver disease -on hemodialysis -low blood counts, like low white blood cells, platelets, or hemoglobin -peripheral neuropathy -taking medicine for blood pressure -an unusual or allergic reaction to bortezomib, mannitol, boron, other medicines, foods, dyes, or preservatives -pregnant or trying to get pregnant -breast-feeding How should I use this medicine? This medicine is for injection into a vein or for injection under the skin. It is given by a health care professional in a hospital or clinic setting. Talk to your pediatrician regarding the use of this medicine in children. Special care may be needed. Overdosage: If you think you have taken too much of this medicine contact a poison control center or emergency room at once. NOTE: This medicine is only for you. Do not share this medicine with others. What if I miss a dose? It is important not to miss your dose. Call your doctor or health care professional if you are unable to keep an appointment. What may interact with this medicine? This medicine may interact with the following medications: -ketoconazole -rifampin -ritonavir -St. John's Wort This list may not describe all possible interactions. Give your health care provider a list of all the medicines, herbs, non-prescription drugs, or dietary supplements you use. Also tell them if you smoke, drink alcohol, or use illegal drugs. Some items may interact with your medicine. What  should I watch for while using this medicine? Visit your doctor for checks on your progress. This drug may make you feel generally unwell. This is not uncommon, as chemotherapy can affect healthy cells as well as cancer cells. Report any side effects. Continue your course of treatment even though you feel ill unless your doctor tells you to stop. You may get drowsy or dizzy. Do not drive, use machinery, or do anything that needs mental alertness until you know how this medicine affects you. Do not stand or sit up quickly, especially if you are an older patient. This reduces the risk of dizzy or fainting spells. In some cases, you may be given additional medicines to help with side effects. Follow all directions for their use. Call your doctor or health care professional for advice if you get a fever, chills or sore throat, or other symptoms of a cold or flu. Do not treat yourself. This drug decreases your body's ability to fight infections. Try to avoid being around people who are sick. This medicine may increase your risk to bruise or bleed. Call your doctor or health care professional if you notice any unusual bleeding. You may need blood work done while you are taking this medicine. In some patients, this medicine may cause a serious brain infection that may cause death. If you have any problems seeing, thinking, speaking, walking, or standing, tell your doctor right away. If you cannot reach your doctor, urgently seek other source of medical care. Do not become pregnant while taking this medicine. Women should inform their doctor if they wish to become pregnant or think they might be pregnant. There is a potential for serious  side effects to an unborn child. Talk to your health care professional or pharmacist for more information. Do not breast-feed an infant while taking this medicine. Check with your doctor or health care professional if you get an attack of severe diarrhea, nausea and vomiting, or if  you sweat a lot. The loss of too much body fluid can make it dangerous for you to take this medicine. What side effects may I notice from receiving this medicine? Side effects that you should report to your doctor or health care professional as soon as possible: -allergic reactions like skin rash, itching or hives, swelling of the face, lips, or tongue -breathing problems -changes in hearing -changes in vision -fast, irregular heartbeat -feeling faint or lightheaded, falls -pain, tingling, numbness in the hands or feet -right upper belly pain -seizures -swelling of the ankles, feet, hands -unusual bleeding or bruising -unusually weak or tired -vomiting -yellowing of the eyes or skin Side effects that usually do not require medical attention (report to your doctor or health care professional if they continue or are bothersome): -changes in emotions or moods -constipation -diarrhea -loss of appetite -headache -irritation at site where injected -nausea This list may not describe all possible side effects. Call your doctor for medical advice about side effects. You may report side effects to FDA at 1-800-FDA-1088. Where should I keep my medicine? This drug is given in a hospital or clinic and will not be stored at home. NOTE: This sheet is a summary. It may not cover all possible information. If you have questions about this medicine, talk to your doctor, pharmacist, or health care provider.    2016, Elsevier/Gold Standard. (2014-02-23 14:47:04)

## 2015-05-10 ENCOUNTER — Other Ambulatory Visit (HOSPITAL_BASED_OUTPATIENT_CLINIC_OR_DEPARTMENT_OTHER): Payer: BLUE CROSS/BLUE SHIELD

## 2015-05-10 ENCOUNTER — Ambulatory Visit (HOSPITAL_BASED_OUTPATIENT_CLINIC_OR_DEPARTMENT_OTHER): Payer: BLUE CROSS/BLUE SHIELD

## 2015-05-10 ENCOUNTER — Telehealth: Payer: Self-pay | Admitting: Internal Medicine

## 2015-05-10 ENCOUNTER — Encounter: Payer: Self-pay | Admitting: Internal Medicine

## 2015-05-10 ENCOUNTER — Ambulatory Visit (HOSPITAL_BASED_OUTPATIENT_CLINIC_OR_DEPARTMENT_OTHER): Payer: BLUE CROSS/BLUE SHIELD | Admitting: Internal Medicine

## 2015-05-10 VITALS — BP 147/71 | HR 72 | Temp 98.0°F | Resp 19 | Ht 67.0 in | Wt 267.3 lb

## 2015-05-10 DIAGNOSIS — D649 Anemia, unspecified: Secondary | ICD-10-CM | POA: Diagnosis not present

## 2015-05-10 DIAGNOSIS — C9000 Multiple myeloma not having achieved remission: Secondary | ICD-10-CM

## 2015-05-10 DIAGNOSIS — Z5112 Encounter for antineoplastic immunotherapy: Secondary | ICD-10-CM | POA: Diagnosis not present

## 2015-05-10 DIAGNOSIS — G4700 Insomnia, unspecified: Secondary | ICD-10-CM

## 2015-05-10 LAB — CBC WITH DIFFERENTIAL/PLATELET
BASO%: 0.6 % (ref 0.0–2.0)
Basophils Absolute: 0 10*3/uL (ref 0.0–0.1)
EOS%: 1.5 % (ref 0.0–7.0)
Eosinophils Absolute: 0.1 10*3/uL (ref 0.0–0.5)
HCT: 33.9 % — ABNORMAL LOW (ref 34.8–46.6)
HEMOGLOBIN: 10.9 g/dL — AB (ref 11.6–15.9)
LYMPH%: 16.9 % (ref 14.0–49.7)
MCH: 27.2 pg (ref 25.1–34.0)
MCHC: 32.1 g/dL (ref 31.5–36.0)
MCV: 84.5 fL (ref 79.5–101.0)
MONO#: 0.4 10*3/uL (ref 0.1–0.9)
MONO%: 6.2 % (ref 0.0–14.0)
NEUT%: 74.8 % (ref 38.4–76.8)
NEUTROS ABS: 4.3 10*3/uL (ref 1.5–6.5)
Platelets: 153 10*3/uL (ref 145–400)
RBC: 4.01 10*6/uL (ref 3.70–5.45)
RDW: 16.5 % — AB (ref 11.2–14.5)
WBC: 5.8 10*3/uL (ref 3.9–10.3)
lymph#: 1 10*3/uL (ref 0.9–3.3)

## 2015-05-10 LAB — COMPREHENSIVE METABOLIC PANEL
ALT: 49 U/L (ref 0–55)
AST: 45 U/L — AB (ref 5–34)
Albumin: 3.9 g/dL (ref 3.5–5.0)
Alkaline Phosphatase: 82 U/L (ref 40–150)
Anion Gap: 11 mEq/L (ref 3–11)
BILIRUBIN TOTAL: 0.49 mg/dL (ref 0.20–1.20)
BUN: 11.2 mg/dL (ref 7.0–26.0)
CO2: 24 meq/L (ref 22–29)
CREATININE: 0.8 mg/dL (ref 0.6–1.1)
Calcium: 9.9 mg/dL (ref 8.4–10.4)
Chloride: 104 mEq/L (ref 98–109)
EGFR: 84 mL/min/{1.73_m2} — ABNORMAL LOW (ref >90)
GLUCOSE: 131 mg/dL (ref 70–140)
Potassium: 4.3 mEq/L (ref 3.5–5.1)
SODIUM: 139 meq/L (ref 136–145)
TOTAL PROTEIN: 7 g/dL (ref 6.4–8.3)

## 2015-05-10 MED ORDER — ONDANSETRON HCL 8 MG PO TABS
ORAL_TABLET | ORAL | Status: AC
  Filled 2015-05-10: qty 1

## 2015-05-10 MED ORDER — DEXAMETHASONE 4 MG PO TABS: ORAL_TABLET | ORAL | Status: DC

## 2015-05-10 MED ORDER — BORTEZOMIB CHEMO SQ INJECTION 3.5 MG (2.5MG/ML)
1.3000 mg/m2 | Freq: Once | INTRAMUSCULAR | Status: AC
  Administered 2015-05-10: 3.25 mg via SUBCUTANEOUS
  Filled 2015-05-10: qty 3.25

## 2015-05-10 MED ORDER — ONDANSETRON HCL 8 MG PO TABS
8.0000 mg | ORAL_TABLET | Freq: Once | ORAL | Status: AC
  Administered 2015-05-10: 8 mg via ORAL

## 2015-05-10 MED ORDER — ACYCLOVIR 400 MG PO TABS: 400.0000 mg | ORAL_TABLET | Freq: Two times a day (BID) | ORAL | Status: DC

## 2015-05-10 NOTE — Telephone Encounter (Signed)
Gave and printed appt sched and avs for pt for May adn June

## 2015-05-10 NOTE — Patient Instructions (Signed)
[facility] Discharge Instructions for Patients Receiving Chemotherapy  Today you received the following chemotherapy agents Velcade  To help prevent nausea and vomiting after your treatment, we encourage you to take your nausea medication Zofran 8 mg every 8 hours as needed.   If you develop nausea and vomiting that is not controlled by your nausea medication, call the clinic.   BELOW ARE SYMPTOMS THAT SHOULD BE REPORTED IMMEDIATELY:  *FEVER GREATER THAN 100.5 F  *CHILLS WITH OR WITHOUT FEVER  NAUSEA AND VOMITING THAT IS NOT CONTROLLED WITH YOUR NAUSEA MEDICATION  *UNUSUAL SHORTNESS OF BREATH  *UNUSUAL BRUISING OR BLEEDING  TENDERNESS IN MOUTH AND THROAT WITH OR WITHOUT PRESENCE OF ULCERS  *URINARY PROBLEMS  *BOWEL PROBLEMS  UNUSUAL RASH Items with * indicate a potential emergency and should be followed up as soon as possible.  Feel free to call the clinic you have any questions or concerns. The clinic phone number is [phone].  Please show the CHEMO ALERT CARD at check-in to the Emergency Department and triage nurse.   

## 2015-05-10 NOTE — Progress Notes (Signed)
[facility] [phone]   [phone]  OFFICE PROGRESS NOTE  Brenda Kern, MD [place] [address]  DIAGNOSIS: MGUS diagnosed in September 2010, with additional symptoms suggestive of POEMS syndrome.   PRIOR THERAPY: Velcade 1.3 MG/M2 subcutaneously with Decadron 40 mg by mouth on a weekly basis. First cycle 11/24/2013. She status post 31 weekly doses of treatment.   CURRENT THERAPY: The patient will resume treatment again with Velcade 1.3 MG/M2 subcutaneously and weekly basis with Decadron 40 mg by mouth weekly. First dose 02/01/2015. Status post 13 cycles.   INTERVAL HISTORY: Brenda Conner 55 y.o. female returns to the clinic today for followup visit. The patient is feeling fine today with no specific complaints. She is tolerating her treatment with subcutaneous Velcade and Decadron fairly well with no significant complaints except for insomnia for 2 days after Decadron. She also continues to have mild peripheral neuropathy. She denied having any significant weight loss or night sweats. The patient denied having any fever or chills. She has no chest pain, shortness of breath, cough or hemoptysis. She had CBC and comprehensive metabolic panel performed earlier today and she is here for evaluation and discussion of her lab results.  MEDICAL HISTORY: Past Medical History  Diagnosis Date  . PONV (postoperative nausea and vomiting)   . Hypertension   . Anxiety   . Depression   . Asthma   . Sleep apnea     CPAP at bedtime  . Cancer [facility])     waldenstroms/ macroglobinulemia  . Hypercholesteremia   . Hypothyroidism   . Depression   . Macroglobulinemia ([place])     ? POEMS syndrome  . Acid reflux   . Multiple myeloma ([place])   . Diabetes mellitus without complication ([place])   . Obesity   . Sleep apnea     ALLERGIES:  is allergic to codeine; hydrocodone; lortab; onion; shellfish allergy; and amoxicillin.  MEDICATIONS:    Current Outpatient Prescriptions  Medication Sig Dispense Refill  . acyclovir (ZOVIRAX) 400 MG tablet Take 1 tablet (400 mg total) by mouth 2 (two) times daily. 60 tablet 3  . ALPRAZolam (XANAX) 0.5 MG tablet Take 1 tablet (0.5 mg total) by mouth at bedtime as needed for anxiety. 30 tablet 0  . aspirin 81 MG tablet Take 81 mg by mouth daily.    . Azelastine HCl 0.15 % SOLN as needed.    . B-D UF III MINI PEN NEEDLES 31G X 5 MM MISC     . Blood Glucose Monitoring Suppl (ONE TOUCH ULTRA MINI) W/DEVICE KIT See admin instructions. Reported on 01/25/2015  0  . bortezomib IV (VELCADE) 3.5 MG injection Inject into the vein once.    Brenda Conner dexamethasone (DECADRON) 4 MG tablet 10 tablet by mouth every week. Start with the first dose of chemotherapy. 40 tablet 3  . EPIPEN 2-PAK 0.3 MG/0.3ML SOAJ injection Reported on 03/08/2015  1  . Fluticasone-Salmeterol (ADVAIR) 100-50 MCG/DOSE AEPB Inhale 2 puffs into the lungs every 12 (twelve) hours.    Brenda Conner ibuprofen (ADVIL,MOTRIN) 100 MG tablet Take 100 mg by mouth every 6 (six) hours as needed. Reported on 01/25/2015    . insulin glargine (LANTUS) 100 UNIT/ML injection Inject 8 Units into the skin 2 (two) times a week. Reported on 02/18/2015    . levocetirizine (XYZAL) 5 MG tablet Take 5 mg by mouth every evening.    Brenda Conner levothyroxine (SYNTHROID, LEVOTHROID) 150 MCG tablet Take 150 mcg by  mouth daily before breakfast.    . lisinopril (PRINIVIL,ZESTRIL) 10 MG tablet Take 10 mg by mouth daily.  3  . metFORMIN (GLUCOPHAGE) 1000 MG tablet Take 1,000 mg by mouth 2 (two) times daily.  3  . NOVOLOG FLEXPEN 100 UNIT/ML FlexPen 3 Units daily as needed. Reported on 01/25/2015  6  . omeprazole (PRILOSEC) 40 MG capsule Take 40 mg by mouth 2 (two) times daily.     . ondansetron (ZOFRAN-ODT) 8 MG disintegrating tablet Take 8 mg by mouth every 8 (eight) hours as needed. Reported on 01/25/2015  2  . ONE TOUCH ULTRA TEST test strip See admin instructions. Reported on 01/25/2015  11  .  pravastatin (PRAVACHOL) 40 MG tablet Take 40 mg by mouth every evening.     Brenda Conner PROAIR HFA 108 (90 Base) MCG/ACT inhaler INHALE 2 PUFFS BY MOUTH 4 TIMES A DAY AS NEEDED FOR ASTHMA  1  . sertraline (ZOLOFT) 50 MG tablet Take 3 tablets (150 mg total) by mouth daily. 270 tablet 4  . verapamil (CALAN-SR) 240 MG CR tablet Take 240 mg by mouth 2 (two) times daily.     No current facility-administered medications for this visit.    SURGICAL HISTORY:  Past Surgical History  Procedure Laterality Date  . Cholecystectomy    . Back surgery  03/2004    herniation, L4-L5  . Bil laprascopic knee surgery    . Uterine fibroid embolization    . Bone marrow biopsy      2011  . Laparoscopic cholecystectomy  1997  . Foot surgery  1998  . Nasal sinus surgery  2004  . Ablation  09/2007    HTA and polyp resection  . Knee arthroscopy w/ meniscal repair  10/10 ; 3/11  . Bone marrow biopsy  4/14    REVIEW OF SYSTEMS:  A comprehensive review of systems was negative except for: Constitutional: positive for fatigue Neurological: positive for paresthesia   PHYSICAL EXAMINATION: General appearance: alert, cooperative and no distress Head: Normocephalic, without obvious abnormality, atraumatic Neck: no adenopathy Lymph nodes: Cervical, supraclavicular, and axillary nodes normal. Resp: clear to auscultation bilaterally Back: symmetric, no curvature. ROM normal. No CVA tenderness. Cardio: regular rate and rhythm, S1, S2 normal, no murmur, click, rub or gallop GI: soft, non-tender; bowel sounds normal; no masses,  no organomegaly Extremities: extremities normal, atraumatic, no cyanosis or edema Neurologic: Alert and oriented X 3, normal strength and tone. Normal symmetric reflexes. Normal coordination and gait  ECOG PERFORMANCE STATUS: 0 - Asymptomatic  Blood pressure 147/71, pulse 72, temperature 98 F (36.7 C), temperature source Oral, resp. rate 19, height 5' 7"  (1.702 m), weight 267 lb 4.8 oz (121.246 kg),  SpO2 99 %.  LABORATORY DATA: Lab Results  Component Value Date   WBC 5.8 05/10/2015   HGB 10.9* 05/10/2015   HCT 33.9* 05/10/2015   MCV 84.5 05/10/2015   PLT 153 05/10/2015      Chemistry      Component Value Date/Time   NA 139 05/03/2015 0826   NA 137 03/20/2011 1509   K 4.0 05/03/2015 0826   K 3.4* 03/20/2011 1509   CL 104 04/07/2012 1415   CL 102 03/20/2011 1509   CO2 23 05/03/2015 0826   CO2 26 03/20/2011 1509   BUN 8.3 05/03/2015 0826   BUN 7 03/20/2011 1509   CREATININE 0.7 05/03/2015 0826   CREATININE 0.63 03/20/2011 1509      Component Value Date/Time   CALCIUM 9.4 05/03/2015 0826  CALCIUM 9.2 03/20/2011 1509   ALKPHOS 89 05/03/2015 0826   ALKPHOS 74 03/20/2011 1509   AST 48* 05/03/2015 0826   AST 24 03/20/2011 1509   ALT 54 05/03/2015 0826   ALT 26 03/20/2011 1509   BILITOT 0.48 05/03/2015 0826   BILITOT 0.3 03/20/2011 1509     Other lab results: Beta-2 microglobulin 1.7, free kappa light chain 9.36, free lambda light chain 1014.02 with a kappa/lambda ratio of 0.01. IgG 354, IgA 79 and IgM 604.  ASSESSMENT AND PLAN: This is a very pleasant 55 years old white female with a multiple myeloma initially diagnosed as monoclonal gammopathy in addition to peripheral neuropathy, and Endocrinopathy in the form of hypothyroidism and skin rash. The symptoms combined are suspicious for POEMS syndrome.  She completed treatment with subcutaneous tenderness Velcade and Decadron status post 31 cycles and tolerated her treatment fairly well. She has been on observation for the last 6 months. 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 She resumed her treatment with subcutaneous Velcade and Decadron status post 13 cycles. She is tolerating the treatment well. CBC today is unremarkable except for mild anemia. Comprehensive metabolic panel is still pending I recommended  for the patient to proceed with cycle #14 of her treatment with Velcade and Decadron today as a scheduled.  The patient would come back for follow-up visit in 3 weeks for reevaluation with repeat blood work. She was advised to call immediately if she has any concerning symptoms in the interval.  All questions were answered. The patient knows to call the clinic with any problems, questions or concerns. We can certainly see the patient much sooner if necessary.  Disclaimer: This note was dictated with voice recognition software. Similar sounding words can inadvertently be transcribed and may not be corrected upon review.

## 2015-05-17 ENCOUNTER — Ambulatory Visit (HOSPITAL_BASED_OUTPATIENT_CLINIC_OR_DEPARTMENT_OTHER): Payer: BLUE CROSS/BLUE SHIELD

## 2015-05-17 ENCOUNTER — Other Ambulatory Visit (HOSPITAL_BASED_OUTPATIENT_CLINIC_OR_DEPARTMENT_OTHER): Payer: BLUE CROSS/BLUE SHIELD

## 2015-05-17 VITALS — BP 150/80 | HR 64 | Temp 97.7°F | Resp 18

## 2015-05-17 DIAGNOSIS — Z5112 Encounter for antineoplastic immunotherapy: Secondary | ICD-10-CM

## 2015-05-17 DIAGNOSIS — C9000 Multiple myeloma not having achieved remission: Secondary | ICD-10-CM

## 2015-05-17 LAB — CBC WITH DIFFERENTIAL/PLATELET
BASO%: 0.8 % (ref 0.0–2.0)
Basophils Absolute: 0.1 10*3/uL (ref 0.0–0.1)
EOS%: 1.2 % (ref 0.0–7.0)
Eosinophils Absolute: 0.1 10*3/uL (ref 0.0–0.5)
HCT: 33.8 % — ABNORMAL LOW (ref 34.8–46.6)
HGB: 10.9 g/dL — ABNORMAL LOW (ref 11.6–15.9)
LYMPH%: 16.4 % (ref 14.0–49.7)
MCH: 27.5 pg (ref 25.1–34.0)
MCHC: 32.3 g/dL (ref 31.5–36.0)
MCV: 85 fL (ref 79.5–101.0)
MONO#: 0.4 10*3/uL (ref 0.1–0.9)
MONO%: 5.9 % (ref 0.0–14.0)
NEUT#: 5.5 10*3/uL (ref 1.5–6.5)
NEUT%: 75.7 % (ref 38.4–76.8)
Platelets: 158 10*3/uL (ref 145–400)
RBC: 3.97 10*6/uL (ref 3.70–5.45)
RDW: 16.9 % — ABNORMAL HIGH (ref 11.2–14.5)
WBC: 7.3 10*3/uL (ref 3.9–10.3)
lymph#: 1.2 10*3/uL (ref 0.9–3.3)

## 2015-05-17 LAB — COMPREHENSIVE METABOLIC PANEL
ALT: 58 U/L — AB (ref 0–55)
AST: 40 U/L — AB (ref 5–34)
Albumin: 3.9 g/dL (ref 3.5–5.0)
Alkaline Phosphatase: 82 U/L (ref 40–150)
Anion Gap: 12 mEq/L — ABNORMAL HIGH (ref 3–11)
BUN: 10.7 mg/dL (ref 7.0–26.0)
CO2: 23 meq/L (ref 22–29)
Calcium: 9.7 mg/dL (ref 8.4–10.4)
Chloride: 104 mEq/L (ref 98–109)
Creatinine: 0.8 mg/dL (ref 0.6–1.1)
EGFR: 82 mL/min/{1.73_m2} — AB (ref >90)
GLUCOSE: 146 mg/dL — AB (ref 70–140)
POTASSIUM: 4 meq/L (ref 3.5–5.1)
SODIUM: 139 meq/L (ref 136–145)
TOTAL PROTEIN: 7 g/dL (ref 6.4–8.3)
Total Bilirubin: 0.49 mg/dL (ref 0.20–1.20)

## 2015-05-17 MED ORDER — BORTEZOMIB CHEMO SQ INJECTION 3.5 MG (2.5MG/ML)
1.3000 mg/m2 | Freq: Once | INTRAMUSCULAR | Status: AC
  Administered 2015-05-17: 3.25 mg via SUBCUTANEOUS
  Filled 2015-05-17: qty 3.25

## 2015-05-17 MED ORDER — ONDANSETRON HCL 8 MG PO TABS
8.0000 mg | ORAL_TABLET | Freq: Once | ORAL | Status: AC
  Administered 2015-05-17: 8 mg via ORAL

## 2015-05-17 MED ORDER — ONDANSETRON HCL 8 MG PO TABS
ORAL_TABLET | ORAL | Status: AC
  Filled 2015-05-17: qty 1

## 2015-05-17 NOTE — Patient Instructions (Signed)
[facility] Discharge Instructions for Patients Receiving Chemotherapy  Today you received the following chemotherapy agents Velcade  To help prevent nausea and vomiting after your treatment, we encourage you to take your nausea medication Zofran 8 mg every 8 hours as needed.   If you develop nausea and vomiting that is not controlled by your nausea medication, call the clinic.   BELOW ARE SYMPTOMS THAT SHOULD BE REPORTED IMMEDIATELY:  *FEVER GREATER THAN 100.5 F  *CHILLS WITH OR WITHOUT FEVER  NAUSEA AND VOMITING THAT IS NOT CONTROLLED WITH YOUR NAUSEA MEDICATION  *UNUSUAL SHORTNESS OF BREATH  *UNUSUAL BRUISING OR BLEEDING  TENDERNESS IN MOUTH AND THROAT WITH OR WITHOUT PRESENCE OF ULCERS  *URINARY PROBLEMS  *BOWEL PROBLEMS  UNUSUAL RASH Items with * indicate a potential emergency and should be followed up as soon as possible.  Feel free to call the clinic you have any questions or concerns. The clinic phone number is [phone].  Please show the CHEMO ALERT CARD at check-in to the Emergency Department and triage nurse.   

## 2015-05-24 ENCOUNTER — Other Ambulatory Visit (HOSPITAL_BASED_OUTPATIENT_CLINIC_OR_DEPARTMENT_OTHER): Payer: BLUE CROSS/BLUE SHIELD

## 2015-05-24 ENCOUNTER — Ambulatory Visit (HOSPITAL_BASED_OUTPATIENT_CLINIC_OR_DEPARTMENT_OTHER): Payer: BLUE CROSS/BLUE SHIELD

## 2015-05-24 VITALS — BP 137/75 | HR 61 | Temp 97.9°F | Resp 20

## 2015-05-24 DIAGNOSIS — C9000 Multiple myeloma not having achieved remission: Secondary | ICD-10-CM

## 2015-05-24 DIAGNOSIS — Z5112 Encounter for antineoplastic immunotherapy: Secondary | ICD-10-CM

## 2015-05-24 LAB — COMPREHENSIVE METABOLIC PANEL
ALK PHOS: 79 U/L (ref 40–150)
ALT: 53 U/L (ref 0–55)
AST: 50 U/L — AB (ref 5–34)
Albumin: 3.8 g/dL (ref 3.5–5.0)
Anion Gap: 9 mEq/L (ref 3–11)
BUN: 14.6 mg/dL (ref 7.0–26.0)
CALCIUM: 9.6 mg/dL (ref 8.4–10.4)
CHLORIDE: 104 meq/L (ref 98–109)
CO2: 24 mEq/L (ref 22–29)
Creatinine: 0.8 mg/dL (ref 0.6–1.1)
EGFR: 87 mL/min/{1.73_m2} — AB (ref >90)
Glucose: 145 mg/dl — ABNORMAL HIGH (ref 70–140)
POTASSIUM: 4.4 meq/L (ref 3.5–5.1)
Sodium: 137 mEq/L (ref 136–145)
Total Bilirubin: 0.39 mg/dL (ref 0.20–1.20)
Total Protein: 6.9 g/dL (ref 6.4–8.3)

## 2015-05-24 LAB — CBC WITH DIFFERENTIAL/PLATELET
BASO%: 0.3 % (ref 0.0–2.0)
Basophils Absolute: 0 10*3/uL (ref 0.0–0.1)
EOS%: 1.7 % (ref 0.0–7.0)
Eosinophils Absolute: 0.1 10*3/uL (ref 0.0–0.5)
HCT: 32.5 % — ABNORMAL LOW (ref 34.8–46.6)
HGB: 10.4 g/dL — ABNORMAL LOW (ref 11.6–15.9)
LYMPH%: 20.2 % (ref 14.0–49.7)
MCH: 27.7 pg (ref 25.1–34.0)
MCHC: 32 g/dL (ref 31.5–36.0)
MCV: 86.7 fL (ref 79.5–101.0)
MONO#: 0.3 10*3/uL (ref 0.1–0.9)
MONO%: 5.5 % (ref 0.0–14.0)
NEUT#: 4.3 10*3/uL (ref 1.5–6.5)
NEUT%: 72.3 % (ref 38.4–76.8)
Platelets: 146 10*3/uL (ref 145–400)
RBC: 3.75 10*6/uL (ref 3.70–5.45)
RDW: 15.6 % — ABNORMAL HIGH (ref 11.2–14.5)
WBC: 6 10*3/uL (ref 3.9–10.3)
lymph#: 1.2 10*3/uL (ref 0.9–3.3)

## 2015-05-24 MED ORDER — BORTEZOMIB CHEMO SQ INJECTION 3.5 MG (2.5MG/ML)
1.3000 mg/m2 | Freq: Once | INTRAMUSCULAR | Status: AC
  Administered 2015-05-24: 3.25 mg via SUBCUTANEOUS
  Filled 2015-05-24: qty 3.25

## 2015-05-24 MED ORDER — ONDANSETRON HCL 8 MG PO TABS
8.0000 mg | ORAL_TABLET | Freq: Once | ORAL | Status: AC
  Administered 2015-05-24: 8 mg via ORAL

## 2015-05-24 MED ORDER — ONDANSETRON HCL 8 MG PO TABS
ORAL_TABLET | ORAL | Status: AC
  Filled 2015-05-24: qty 1

## 2015-05-31 ENCOUNTER — Ambulatory Visit (HOSPITAL_BASED_OUTPATIENT_CLINIC_OR_DEPARTMENT_OTHER): Payer: BLUE CROSS/BLUE SHIELD

## 2015-05-31 ENCOUNTER — Ambulatory Visit (HOSPITAL_BASED_OUTPATIENT_CLINIC_OR_DEPARTMENT_OTHER): Payer: BLUE CROSS/BLUE SHIELD | Admitting: Internal Medicine

## 2015-05-31 ENCOUNTER — Other Ambulatory Visit (HOSPITAL_BASED_OUTPATIENT_CLINIC_OR_DEPARTMENT_OTHER): Payer: BLUE CROSS/BLUE SHIELD

## 2015-05-31 ENCOUNTER — Encounter: Payer: Self-pay | Admitting: Internal Medicine

## 2015-05-31 VITALS — BP 140/58 | HR 63 | Temp 98.4°F | Resp 18 | Ht 67.0 in | Wt 267.3 lb

## 2015-05-31 DIAGNOSIS — Z5112 Encounter for antineoplastic immunotherapy: Secondary | ICD-10-CM

## 2015-05-31 DIAGNOSIS — C9000 Multiple myeloma not having achieved remission: Secondary | ICD-10-CM

## 2015-05-31 LAB — CBC WITH DIFFERENTIAL/PLATELET
BASO%: 0.5 % (ref 0.0–2.0)
Basophils Absolute: 0 10*3/uL (ref 0.0–0.1)
EOS ABS: 0.1 10*3/uL (ref 0.0–0.5)
EOS%: 1.6 % (ref 0.0–7.0)
HCT: 33.3 % — ABNORMAL LOW (ref 34.8–46.6)
HEMOGLOBIN: 10.9 g/dL — AB (ref 11.6–15.9)
LYMPH#: 0.9 10*3/uL (ref 0.9–3.3)
LYMPH%: 14.1 % (ref 14.0–49.7)
MCH: 28 pg (ref 25.1–34.0)
MCHC: 32.8 g/dL (ref 31.5–36.0)
MCV: 85.3 fL (ref 79.5–101.0)
MONO#: 0.4 10*3/uL (ref 0.1–0.9)
MONO%: 5.7 % (ref 0.0–14.0)
NEUT%: 78.1 % — ABNORMAL HIGH (ref 38.4–76.8)
NEUTROS ABS: 4.8 10*3/uL (ref 1.5–6.5)
Platelets: 152 10*3/uL (ref 145–400)
RBC: 3.91 10*6/uL (ref 3.70–5.45)
RDW: 16.4 % — AB (ref 11.2–14.5)
WBC: 6.2 10*3/uL (ref 3.9–10.3)

## 2015-05-31 LAB — COMPREHENSIVE METABOLIC PANEL
ALBUMIN: 3.7 g/dL (ref 3.5–5.0)
ALK PHOS: 74 U/L (ref 40–150)
ALT: 53 U/L (ref 0–55)
AST: 47 U/L — AB (ref 5–34)
Anion Gap: 11 mEq/L (ref 3–11)
BILIRUBIN TOTAL: 0.52 mg/dL (ref 0.20–1.20)
BUN: 9.5 mg/dL (ref 7.0–26.0)
CO2: 23 mEq/L (ref 22–29)
Calcium: 9.4 mg/dL (ref 8.4–10.4)
Chloride: 104 mEq/L (ref 98–109)
Creatinine: 0.8 mg/dL (ref 0.6–1.1)
EGFR: 87 mL/min/{1.73_m2} — ABNORMAL LOW (ref >90)
GLUCOSE: 144 mg/dL — AB (ref 70–140)
Potassium: 4.2 mEq/L (ref 3.5–5.1)
SODIUM: 139 meq/L (ref 136–145)
TOTAL PROTEIN: 6.7 g/dL (ref 6.4–8.3)

## 2015-05-31 MED ORDER — BORTEZOMIB CHEMO SQ INJECTION 3.5 MG (2.5MG/ML)
1.3000 mg/m2 | Freq: Once | INTRAMUSCULAR | Status: AC
  Administered 2015-05-31: 3.25 mg via SUBCUTANEOUS
  Filled 2015-05-31: qty 3.25

## 2015-05-31 MED ORDER — ONDANSETRON HCL 8 MG PO TABS
8.0000 mg | ORAL_TABLET | Freq: Once | ORAL | Status: AC
  Administered 2015-05-31: 8 mg via ORAL

## 2015-05-31 MED ORDER — ONDANSETRON HCL 8 MG PO TABS
ORAL_TABLET | ORAL | Status: AC
  Filled 2015-05-31: qty 1

## 2015-05-31 NOTE — Progress Notes (Signed)
[facility] [phone]   [phone]  OFFICE PROGRESS NOTE  [name], MD [place] [address]  DIAGNOSIS: MGUS diagnosed in September 2010, with additional symptoms suggestive of POEMS syndrome.   PRIOR THERAPY: Velcade 1.3 MG/M2 subcutaneously with Decadron 40 mg by mouth on a weekly basis. First cycle 11/24/2013. She status post 31 weekly doses of treatment.   CURRENT THERAPY: The patient will resume treatment again with Velcade 1.3 MG/M2 subcutaneously and weekly basis with Decadron 40 mg by mouth weekly. First dose 02/01/2015. Status post 16 cycles.   INTERVAL HISTORY: Brenda Conner 55 y.o. female returns to the clinic today for followup visit. The patient is feeling fine today with no specific complaints. She is tolerating her treatment with subcutaneous Velcade and Decadron fairly well. Her peripheral neuropathy is better. She denied having any significant weight loss or night sweats. The patient denied having any fever or chills. She has no chest pain, shortness of breath, cough or hemoptysis. She had CBC and comprehensive metabolic panel performed earlier today and she is here for evaluation and discussion of her lab results.  MEDICAL HISTORY: Past Medical History  Diagnosis Date  . PONV (postoperative nausea and vomiting)   . Hypertension   . Anxiety   . Depression   . Asthma   . Sleep apnea     CPAP at bedtime  . Cancer [facility])     waldenstroms/ macroglobinulemia  . Hypercholesteremia   . Hypothyroidism   . Depression   . Macroglobulinemia ([place])     ? POEMS syndrome  . Acid reflux   . Multiple myeloma ([place])   . Diabetes mellitus without complication ([place])   . Obesity   . Sleep apnea     ALLERGIES:  is allergic to codeine; hydrocodone; lortab; onion; shellfish allergy; and amoxicillin.  MEDICATIONS:  Current Outpatient Prescriptions  Medication Sig Dispense Refill  . acyclovir (ZOVIRAX) 400 MG  tablet Take 1 tablet (400 mg total) by mouth 2 (two) times daily. 60 tablet 3  . ALPRAZolam (XANAX) 0.5 MG tablet Take 1 tablet (0.5 mg total) by mouth at bedtime as needed for anxiety. 30 tablet 0  . aspirin 81 MG tablet Take 81 mg by mouth daily.    . Azelastine HCl 0.15 % SOLN as needed.    . B-D UF III MINI PEN NEEDLES 31G X 5 MM MISC     . Blood Glucose Monitoring Suppl (ONE TOUCH ULTRA MINI) W/DEVICE KIT See admin instructions. Reported on 01/25/2015  0  . bortezomib IV (VELCADE) 3.5 MG injection Inject into the vein once.    [name] dexamethasone (DECADRON) 4 MG tablet 10 tablet by mouth every week. Start with the first dose of chemotherapy. 40 tablet 3  . Fluticasone-Salmeterol (ADVAIR) 100-50 MCG/DOSE AEPB Inhale 2 puffs into the lungs every 12 (twelve) hours.    . insulin glargine (LANTUS) 100 UNIT/ML injection Inject 8 Units into the skin 2 (two) times a week. Reported on 02/18/2015    . levocetirizine (XYZAL) 5 MG tablet Take 5 mg by mouth every evening.    [name] levothyroxine (SYNTHROID, LEVOTHROID) 150 MCG tablet Take 150 mcg by mouth daily before breakfast.    . lisinopril (PRINIVIL,ZESTRIL) 10 MG tablet Take 10 mg by mouth daily.  3  . metFORMIN (GLUCOPHAGE) 1000 MG tablet Take 1,000 mg by mouth 2 (two) times daily.  3  . NOVOLOG FLEXPEN 100 UNIT/ML FlexPen 3 Units daily as needed. Reported  on 01/25/2015  6  . omeprazole (PRILOSEC) 40 MG capsule Take 40 mg by mouth 2 (two) times daily.     . ONE TOUCH ULTRA TEST test strip See admin instructions. Reported on 01/25/2015  11  . pravastatin (PRAVACHOL) 40 MG tablet Take 40 mg by mouth every evening.     . sertraline (ZOLOFT) 50 MG tablet Take 3 tablets (150 mg total) by mouth daily. 270 tablet 4  . verapamil (CALAN-SR) 240 MG CR tablet Take 240 mg by mouth 2 (two) times daily.    [name] EPIPEN 2-PAK 0.3 MG/0.3ML SOAJ injection Reported on 05/31/2015  1  . ibuprofen (ADVIL,MOTRIN) 100 MG tablet Take 100 mg by mouth every 6 (six) hours as needed.  Reported on 05/31/2015    . ondansetron (ZOFRAN-ODT) 8 MG disintegrating tablet Take 8 mg by mouth every 8 (eight) hours as needed. Reported on 05/31/2015  2  . PROAIR HFA 108 (90 Base) MCG/ACT inhaler Reported on 05/31/2015  1   No current facility-administered medications for this visit.    SURGICAL HISTORY:  Past Surgical History  Procedure Laterality Date  . Cholecystectomy    . Back surgery  03/2004    herniation, L4-L5  . Bil laprascopic knee surgery    . Uterine fibroid embolization    . Bone marrow biopsy      2011  . Laparoscopic cholecystectomy  1997  . Foot surgery  1998  . Nasal sinus surgery  2004  . Ablation  09/2007    HTA and polyp resection  . Knee arthroscopy w/ meniscal repair  10/10 ; 3/11  . Bone marrow biopsy  4/14    REVIEW OF SYSTEMS:  A comprehensive review of systems was negative except for: Constitutional: positive for fatigue   PHYSICAL EXAMINATION: General appearance: alert, cooperative and no distress Head: Normocephalic, without obvious abnormality, atraumatic Neck: no adenopathy Lymph nodes: Cervical, supraclavicular, and axillary nodes normal. Resp: clear to auscultation bilaterally Back: symmetric, no curvature. ROM normal. No CVA tenderness. Cardio: regular rate and rhythm, S1, S2 normal, no murmur, click, rub or gallop GI: soft, non-tender; bowel sounds normal; no masses,  no organomegaly Extremities: extremities normal, atraumatic, no cyanosis or edema Neurologic: Alert and oriented X 3, normal strength and tone. Normal symmetric reflexes. Normal coordination and gait  ECOG PERFORMANCE STATUS: 0 - Asymptomatic  Blood pressure 140/58, pulse 63, temperature 98.4 F (36.9 C), temperature source Oral, resp. rate 18, height 5' 7"  (1.702 m), weight 267 lb 4.8 oz (121.246 kg), SpO2 99 %.  LABORATORY DATA: Lab Results  Component Value Date   WBC 6.2 05/31/2015   HGB 10.9* 05/31/2015   HCT 33.3* 05/31/2015   MCV 85.3 05/31/2015   PLT 152  05/31/2015      Chemistry      Component Value Date/Time   NA 137 05/24/2015 0810   NA 137 03/20/2011 1509   K 4.4 05/24/2015 0810   K 3.4* 03/20/2011 1509   CL 104 04/07/2012 1415   CL 102 03/20/2011 1509   CO2 24 05/24/2015 0810   CO2 26 03/20/2011 1509   BUN 14.6 05/24/2015 0810   BUN 7 03/20/2011 1509   CREATININE 0.8 05/24/2015 0810   CREATININE 0.63 03/20/2011 1509      Component Value Date/Time   CALCIUM 9.6 05/24/2015 0810   CALCIUM 9.2 03/20/2011 1509   ALKPHOS 79 05/24/2015 0810   ALKPHOS 74 03/20/2011 1509   AST 50* 05/24/2015 0810   AST 24 03/20/2011 1509  ALT 53 05/24/2015 0810   ALT 26 03/20/2011 1509   BILITOT 0.39 05/24/2015 0810   BILITOT 0.3 03/20/2011 1509     Other lab results: Beta-2 microglobulin 1.7, free kappa light chain 9.36, free lambda light chain 1014.02 with a kappa/lambda ratio of 0.01. IgG 354, IgA 79 and IgM 604.  ASSESSMENT AND PLAN: This is a very pleasant 55 years old white female with a multiple myeloma initially diagnosed as monoclonal gammopathy in addition to peripheral neuropathy, and Endocrinopathy in the form of hypothyroidism and skin rash. The symptoms combined are suspicious for POEMS syndrome.  She completed treatment with subcutaneous tenderness Velcade and Decadron status post 31 cycles and tolerated her treatment fairly well. She has been on observation for the last 6 months. 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 She resumed her treatment with subcutaneous Velcade and Decadron status post 16 cycles. She is tolerating the treatment well. CBC today is unremarkable except for persistent mild anemia. Comprehensive metabolic panel is still pending I recommended for the patient to proceed with cycle #17 of her treatment with Velcade and Decadron today as a scheduled.  The patient would come back for follow-up visit in 4  weeks for reevaluation with repeat myeloma panel. She was advised to call immediately if she has any concerning symptoms in the interval.  All questions were answered. The patient knows to call the clinic with any problems, questions or concerns. We can certainly see the patient much sooner if necessary.  Disclaimer: This note was dictated with voice recognition software. Similar sounding words can inadvertently be transcribed and may not be corrected upon review.

## 2015-05-31 NOTE — Patient Instructions (Signed)
[facility] Discharge Instructions for Patients Receiving Chemotherapy  Today you received the following chemotherapy agents:  Velcade.  To help prevent nausea and vomiting after your treatment, we encourage you to take your nausea medication: Zofran 8 mg every 8 hours as needed.   If you develop nausea and vomiting that is not controlled by your nausea medication, call the clinic.   BELOW ARE SYMPTOMS THAT SHOULD BE REPORTED IMMEDIATELY:  *FEVER GREATER THAN 100.5 F  *CHILLS WITH OR WITHOUT FEVER  NAUSEA AND VOMITING THAT IS NOT CONTROLLED WITH YOUR NAUSEA MEDICATION  *UNUSUAL SHORTNESS OF BREATH  *UNUSUAL BRUISING OR BLEEDING  TENDERNESS IN MOUTH AND THROAT WITH OR WITHOUT PRESENCE OF ULCERS  *URINARY PROBLEMS  *BOWEL PROBLEMS  UNUSUAL RASH Items with * indicate a potential emergency and should be followed up as soon as possible.  Feel free to call the clinic you have any questions or concerns. The clinic phone number is [phone].  Please show the [facility] at check-in to the Emergency Department and triage nurse.  [id number]

## 2015-06-01 DIAGNOSIS — G4733 Obstructive sleep apnea (adult) (pediatric): Secondary | ICD-10-CM | POA: Diagnosis not present

## 2015-06-02 DIAGNOSIS — L739 Follicular disorder, unspecified: Secondary | ICD-10-CM | POA: Diagnosis not present

## 2015-06-07 ENCOUNTER — Other Ambulatory Visit (HOSPITAL_BASED_OUTPATIENT_CLINIC_OR_DEPARTMENT_OTHER): Payer: BLUE CROSS/BLUE SHIELD

## 2015-06-07 ENCOUNTER — Ambulatory Visit (HOSPITAL_BASED_OUTPATIENT_CLINIC_OR_DEPARTMENT_OTHER): Payer: BLUE CROSS/BLUE SHIELD

## 2015-06-07 VITALS — BP 135/64 | HR 60 | Temp 98.0°F | Resp 18

## 2015-06-07 DIAGNOSIS — Z5112 Encounter for antineoplastic immunotherapy: Secondary | ICD-10-CM

## 2015-06-07 DIAGNOSIS — C9000 Multiple myeloma not having achieved remission: Secondary | ICD-10-CM

## 2015-06-07 LAB — COMPREHENSIVE METABOLIC PANEL
ALBUMIN: 3.6 g/dL (ref 3.5–5.0)
ALK PHOS: 80 U/L (ref 40–150)
ALT: 43 U/L (ref 0–55)
AST: 43 U/L — AB (ref 5–34)
Anion Gap: 11 mEq/L (ref 3–11)
BUN: 10.2 mg/dL (ref 7.0–26.0)
CHLORIDE: 106 meq/L (ref 98–109)
CO2: 24 mEq/L (ref 22–29)
Calcium: 9.3 mg/dL (ref 8.4–10.4)
Creatinine: 0.7 mg/dL (ref 0.6–1.1)
GLUCOSE: 143 mg/dL — AB (ref 70–140)
POTASSIUM: 4.1 meq/L (ref 3.5–5.1)
SODIUM: 141 meq/L (ref 136–145)
Total Bilirubin: 0.44 mg/dL (ref 0.20–1.20)
Total Protein: 6.6 g/dL (ref 6.4–8.3)

## 2015-06-07 LAB — CBC WITH DIFFERENTIAL/PLATELET
BASO%: 0.8 % (ref 0.0–2.0)
BASOS ABS: 0 10*3/uL (ref 0.0–0.1)
EOS ABS: 0.1 10*3/uL (ref 0.0–0.5)
EOS%: 1.9 % (ref 0.0–7.0)
HCT: 34.3 % — ABNORMAL LOW (ref 34.8–46.6)
HEMOGLOBIN: 11 g/dL — AB (ref 11.6–15.9)
LYMPH%: 17.1 % (ref 14.0–49.7)
MCH: 27.7 pg (ref 25.1–34.0)
MCHC: 32.1 g/dL (ref 31.5–36.0)
MCV: 86.2 fL (ref 79.5–101.0)
MONO#: 0.3 10*3/uL (ref 0.1–0.9)
MONO%: 6.7 % (ref 0.0–14.0)
NEUT#: 3.7 10*3/uL (ref 1.5–6.5)
NEUT%: 73.5 % (ref 38.4–76.8)
Platelets: 147 10*3/uL (ref 145–400)
RBC: 3.97 10*6/uL (ref 3.70–5.45)
RDW: 16.5 % — AB (ref 11.2–14.5)
WBC: 5 10*3/uL (ref 3.9–10.3)
lymph#: 0.9 10*3/uL (ref 0.9–3.3)

## 2015-06-07 MED ORDER — BORTEZOMIB CHEMO SQ INJECTION 3.5 MG (2.5MG/ML)
1.3000 mg/m2 | Freq: Once | INTRAMUSCULAR | Status: AC
  Administered 2015-06-07: 3.25 mg via SUBCUTANEOUS
  Filled 2015-06-07: qty 3.25

## 2015-06-07 MED ORDER — ONDANSETRON HCL 8 MG PO TABS
8.0000 mg | ORAL_TABLET | Freq: Once | ORAL | Status: AC
  Administered 2015-06-07: 8 mg via ORAL

## 2015-06-07 MED ORDER — ONDANSETRON HCL 8 MG PO TABS
ORAL_TABLET | ORAL | Status: AC
  Filled 2015-06-07: qty 1

## 2015-06-07 NOTE — Patient Instructions (Signed)
Cancer Center Discharge Instructions for Patients Receiving Chemotherapy  Today you received the following chemotherapy agents:  Velcade  To help prevent nausea and vomiting after your treatment, we encourage you to take your nausea medication as prescribed.   If you develop nausea and vomiting that is not controlled by your nausea medication, call the clinic.   BELOW ARE SYMPTOMS THAT SHOULD BE REPORTED IMMEDIATELY:  *FEVER GREATER THAN 100.5 F  *CHILLS WITH OR WITHOUT FEVER  NAUSEA AND VOMITING THAT IS NOT CONTROLLED WITH YOUR NAUSEA MEDICATION  *UNUSUAL SHORTNESS OF BREATH  *UNUSUAL BRUISING OR BLEEDING  TENDERNESS IN MOUTH AND THROAT WITH OR WITHOUT PRESENCE OF ULCERS  *URINARY PROBLEMS  *BOWEL PROBLEMS  UNUSUAL RASH Items with * indicate a potential emergency and should be followed up as soon as possible.  Feel free to call the clinic you have any questions or concerns. The clinic phone number is [phone].  Please show the CHEMO ALERT CARD at check-in to the Emergency Department and triage nurse.     Bortezomib injection What is this medicine? BORTEZOMIB (bor TEZ oh mib) is a medicine that targets proteins in cancer cells and stops the cancer cells from growing. It is used to treat multiple myeloma and mantle-cell lymphoma. This medicine may be used for other purposes; ask your health care provider or pharmacist if you have questions. What should I tell my health care provider before I take this medicine? They need to know if you have any of these conditions: -diabetes -heart disease -irregular heartbeat -liver disease -on hemodialysis -low blood counts, like low white blood cells, platelets, or hemoglobin -peripheral neuropathy -taking medicine for blood pressure -an unusual or allergic reaction to bortezomib, mannitol, boron, other medicines, foods, dyes, or preservatives -pregnant or trying to get pregnant -breast-feeding How should I  use this medicine? This medicine is for injection into a vein or for injection under the skin. It is given by a health care professional in a hospital or clinic setting. Talk to your pediatrician regarding the use of this medicine in children. Special care may be needed. Overdosage: If you think you have taken too much of this medicine contact a poison control center or emergency room at once. NOTE: This medicine is only for you. Do not share this medicine with others. What if I miss a dose? It is important not to miss your dose. Call your doctor or health care professional if you are unable to keep an appointment. What may interact with this medicine? This medicine may interact with the following medications: -ketoconazole -rifampin -ritonavir -St. John's Wort This list may not describe all possible interactions. Give your health care provider a list of all the medicines, herbs, non-prescription drugs, or dietary supplements you use. Also tell them if you smoke, drink alcohol, or use illegal drugs. Some items may interact with your medicine. What should I watch for while using this medicine? Visit your doctor for checks on your progress. This drug may make you feel generally unwell. This is not uncommon, as chemotherapy can affect healthy cells as well as cancer cells. Report any side effects. Continue your course of treatment even though you feel ill unless your doctor tells you to stop. You may get drowsy or dizzy. Do not drive, use machinery, or do anything that needs mental alertness until you know how this medicine affects you. Do not stand or sit up quickly, especially if you are an older patient. This reduces the risk of dizzy or   fainting spells. In some cases, you may be given additional medicines to help with side effects. Follow all directions for their use. Call your doctor or health care professional for advice if you get a fever, chills or sore throat, or other symptoms of a cold or  flu. Do not treat yourself. This drug decreases your body's ability to fight infections. Try to avoid being around people who are sick. This medicine may increase your risk to bruise or bleed. Call your doctor or health care professional if you notice any unusual bleeding. You may need blood work done while you are taking this medicine. In some patients, this medicine may cause a serious brain infection that may cause death. If you have any problems seeing, thinking, speaking, walking, or standing, tell your doctor right away. If you cannot reach your doctor, urgently seek other source of medical care. Do not become pregnant while taking this medicine. Women should inform their doctor if they wish to become pregnant or think they might be pregnant. There is a potential for serious side effects to an unborn child. Talk to your health care professional or pharmacist for more information. Do not breast-feed an infant while taking this medicine. Check with your doctor or health care professional if you get an attack of severe diarrhea, nausea and vomiting, or if you sweat a lot. The loss of too much body fluid can make it dangerous for you to take this medicine. What side effects may I notice from receiving this medicine? Side effects that you should report to your doctor or health care professional as soon as possible: -allergic reactions like skin rash, itching or hives, swelling of the face, lips, or tongue -breathing problems -changes in hearing -changes in vision -fast, irregular heartbeat -feeling faint or lightheaded, falls -pain, tingling, numbness in the hands or feet -right upper belly pain -seizures -swelling of the ankles, feet, hands -unusual bleeding or bruising -unusually weak or tired -vomiting -yellowing of the eyes or skin Side effects that usually do not require medical attention (report to your doctor or health care professional if they continue or are bothersome): -changes in  emotions or moods -constipation -diarrhea -loss of appetite -headache -irritation at site where injected -nausea This list may not describe all possible side effects. Call your doctor for medical advice about side effects. You may report side effects to FDA at 1-800-FDA-1088. Where should I keep my medicine? This drug is given in a hospital or clinic and will not be stored at home. NOTE: This sheet is a summary. It may not cover all possible information. If you have questions about this medicine, talk to your doctor, pharmacist, or health care provider.    2016, [employer]vier/Gold Standard. (2014-02-23 14:47:04)  

## 2015-06-14 ENCOUNTER — Ambulatory Visit (HOSPITAL_BASED_OUTPATIENT_CLINIC_OR_DEPARTMENT_OTHER): Payer: BLUE CROSS/BLUE SHIELD

## 2015-06-14 ENCOUNTER — Other Ambulatory Visit (HOSPITAL_BASED_OUTPATIENT_CLINIC_OR_DEPARTMENT_OTHER): Payer: BLUE CROSS/BLUE SHIELD

## 2015-06-14 VITALS — BP 140/77 | HR 58 | Temp 97.7°F | Resp 17

## 2015-06-14 DIAGNOSIS — Z5112 Encounter for antineoplastic immunotherapy: Secondary | ICD-10-CM | POA: Diagnosis not present

## 2015-06-14 DIAGNOSIS — C9000 Multiple myeloma not having achieved remission: Secondary | ICD-10-CM

## 2015-06-14 LAB — COMPREHENSIVE METABOLIC PANEL
ALK PHOS: 75 U/L (ref 40–150)
ALT: 41 U/L (ref 0–55)
AST: 35 U/L — ABNORMAL HIGH (ref 5–34)
Albumin: 3.6 g/dL (ref 3.5–5.0)
Anion Gap: 10 mEq/L (ref 3–11)
BILIRUBIN TOTAL: 0.45 mg/dL (ref 0.20–1.20)
BUN: 12.6 mg/dL (ref 7.0–26.0)
CO2: 24 meq/L (ref 22–29)
Calcium: 9.3 mg/dL (ref 8.4–10.4)
Chloride: 104 mEq/L (ref 98–109)
Creatinine: 0.8 mg/dL (ref 0.6–1.1)
EGFR: 90 mL/min/{1.73_m2} — ABNORMAL LOW (ref >90)
GLUCOSE: 123 mg/dL (ref 70–140)
Potassium: 4.2 mEq/L (ref 3.5–5.1)
SODIUM: 138 meq/L (ref 136–145)
TOTAL PROTEIN: 6.7 g/dL (ref 6.4–8.3)

## 2015-06-14 LAB — CBC WITH DIFFERENTIAL/PLATELET
BASO%: 0.6 % (ref 0.0–2.0)
Basophils Absolute: 0 10*3/uL (ref 0.0–0.1)
EOS%: 1.5 % (ref 0.0–7.0)
Eosinophils Absolute: 0.1 10*3/uL (ref 0.0–0.5)
HCT: 31.5 % — ABNORMAL LOW (ref 34.8–46.6)
HGB: 10.3 g/dL — ABNORMAL LOW (ref 11.6–15.9)
LYMPH%: 15.3 % (ref 14.0–49.7)
MCH: 28 pg (ref 25.1–34.0)
MCHC: 32.9 g/dL (ref 31.5–36.0)
MCV: 85.3 fL (ref 79.5–101.0)
MONO#: 0.4 10*3/uL (ref 0.1–0.9)
MONO%: 6.6 % (ref 0.0–14.0)
NEUT%: 76 % (ref 38.4–76.8)
NEUTROS ABS: 4.5 10*3/uL (ref 1.5–6.5)
Platelets: 152 10*3/uL (ref 145–400)
RBC: 3.69 10*6/uL — AB (ref 3.70–5.45)
RDW: 16.3 % — ABNORMAL HIGH (ref 11.2–14.5)
WBC: 5.9 10*3/uL (ref 3.9–10.3)
lymph#: 0.9 10*3/uL (ref 0.9–3.3)

## 2015-06-14 MED ORDER — ONDANSETRON HCL 8 MG PO TABS
8.0000 mg | ORAL_TABLET | Freq: Once | ORAL | Status: AC
  Administered 2015-06-14: 8 mg via ORAL

## 2015-06-14 MED ORDER — BORTEZOMIB CHEMO SQ INJECTION 3.5 MG (2.5MG/ML)
1.3000 mg/m2 | Freq: Once | INTRAMUSCULAR | Status: AC
  Administered 2015-06-14: 3.25 mg via SUBCUTANEOUS
  Filled 2015-06-14: qty 3.25

## 2015-06-14 MED ORDER — ONDANSETRON HCL 8 MG PO TABS
ORAL_TABLET | ORAL | Status: AC
  Filled 2015-06-14: qty 1

## 2015-06-14 NOTE — Patient Instructions (Signed)
Cancer Center Discharge Instructions for Patients Receiving Chemotherapy  Today you received the following chemotherapy agents Velcade  To help prevent nausea and vomiting after your treatment, we encourage you to take your nausea medication Zofran 8 mg every 8 hours as needed.   If you develop nausea and vomiting that is not controlled by your nausea medication, call the clinic.   BELOW ARE SYMPTOMS THAT SHOULD BE REPORTED IMMEDIATELY:  *FEVER GREATER THAN 100.5 F  *CHILLS WITH OR WITHOUT FEVER  NAUSEA AND VOMITING THAT IS NOT CONTROLLED WITH YOUR NAUSEA MEDICATION  *UNUSUAL SHORTNESS OF BREATH  *UNUSUAL BRUISING OR BLEEDING  TENDERNESS IN MOUTH AND THROAT WITH OR WITHOUT PRESENCE OF ULCERS  *URINARY PROBLEMS  *BOWEL PROBLEMS  UNUSUAL RASH Items with * indicate a potential emergency and should be followed up as soon as possible.  Feel free to call the clinic you have any questions or concerns. The clinic phone number is [phone].  Please show the CHEMO ALERT CARD at check-in to the Emergency Department and triage nurse.   

## 2015-06-18 DIAGNOSIS — M542 Cervicalgia: Secondary | ICD-10-CM | POA: Diagnosis not present

## 2015-06-18 DIAGNOSIS — M4003 Postural kyphosis, cervicothoracic region: Secondary | ICD-10-CM | POA: Diagnosis not present

## 2015-06-21 ENCOUNTER — Ambulatory Visit (HOSPITAL_BASED_OUTPATIENT_CLINIC_OR_DEPARTMENT_OTHER): Payer: BLUE CROSS/BLUE SHIELD | Admitting: Internal Medicine

## 2015-06-21 ENCOUNTER — Encounter: Payer: Self-pay | Admitting: Internal Medicine

## 2015-06-21 ENCOUNTER — Other Ambulatory Visit (HOSPITAL_BASED_OUTPATIENT_CLINIC_OR_DEPARTMENT_OTHER): Payer: BLUE CROSS/BLUE SHIELD

## 2015-06-21 ENCOUNTER — Ambulatory Visit (HOSPITAL_BASED_OUTPATIENT_CLINIC_OR_DEPARTMENT_OTHER): Payer: BLUE CROSS/BLUE SHIELD

## 2015-06-21 VITALS — BP 153/63 | HR 68 | Temp 98.3°F | Resp 18 | Ht 67.0 in | Wt 265.1 lb

## 2015-06-21 DIAGNOSIS — C9000 Multiple myeloma not having achieved remission: Secondary | ICD-10-CM

## 2015-06-21 DIAGNOSIS — E8809 Other disorders of plasma-protein metabolism, not elsewhere classified: Secondary | ICD-10-CM

## 2015-06-21 DIAGNOSIS — Z5112 Encounter for antineoplastic immunotherapy: Secondary | ICD-10-CM

## 2015-06-21 LAB — COMPREHENSIVE METABOLIC PANEL
ALBUMIN: 3.7 g/dL (ref 3.5–5.0)
ALK PHOS: 72 U/L (ref 40–150)
ALT: 48 U/L (ref 0–55)
AST: 39 U/L — AB (ref 5–34)
Anion Gap: 11 mEq/L (ref 3–11)
BILIRUBIN TOTAL: 0.47 mg/dL (ref 0.20–1.20)
BUN: 9.6 mg/dL (ref 7.0–26.0)
CO2: 22 mEq/L (ref 22–29)
Calcium: 9.4 mg/dL (ref 8.4–10.4)
Chloride: 104 mEq/L (ref 98–109)
Creatinine: 0.7 mg/dL (ref 0.6–1.1)
EGFR: >90 mL/min/{1.73_m2} (ref >90)
GLUCOSE: 130 mg/dL (ref 70–140)
Potassium: 4.1 mEq/L (ref 3.5–5.1)
SODIUM: 137 meq/L (ref 136–145)
TOTAL PROTEIN: 6.7 g/dL (ref 6.4–8.3)

## 2015-06-21 LAB — CBC WITH DIFFERENTIAL/PLATELET
BASO%: 0.7 % (ref 0.0–2.0)
Basophils Absolute: 0 10*3/uL (ref 0.0–0.1)
EOS ABS: 0.1 10*3/uL (ref 0.0–0.5)
EOS%: 1.4 % (ref 0.0–7.0)
HEMATOCRIT: 31.1 % — AB (ref 34.8–46.6)
HGB: 10.1 g/dL — ABNORMAL LOW (ref 11.6–15.9)
LYMPH#: 1 10*3/uL (ref 0.9–3.3)
LYMPH%: 16.1 % (ref 14.0–49.7)
MCH: 27.4 pg (ref 25.1–34.0)
MCHC: 32.3 g/dL (ref 31.5–36.0)
MCV: 84.8 fL (ref 79.5–101.0)
MONO#: 0.4 10*3/uL (ref 0.1–0.9)
MONO%: 6.1 % (ref 0.0–14.0)
NEUT%: 75.7 % (ref 38.4–76.8)
NEUTROS ABS: 4.6 10*3/uL (ref 1.5–6.5)
Platelets: 166 10*3/uL (ref 145–400)
RBC: 3.67 10*6/uL — AB (ref 3.70–5.45)
RDW: 16 % — ABNORMAL HIGH (ref 11.2–14.5)
WBC: 6.1 10*3/uL (ref 3.9–10.3)

## 2015-06-21 LAB — LACTATE DEHYDROGENASE: LDH: 152 U/L (ref 125–245)

## 2015-06-21 MED ORDER — ONDANSETRON HCL 8 MG PO TABS
8.0000 mg | ORAL_TABLET | Freq: Once | ORAL | Status: AC
  Administered 2015-06-21: 8 mg via ORAL

## 2015-06-21 MED ORDER — BORTEZOMIB CHEMO SQ INJECTION 3.5 MG (2.5MG/ML)
1.3000 mg/m2 | Freq: Once | INTRAMUSCULAR | Status: AC
  Administered 2015-06-21: 3.25 mg via SUBCUTANEOUS
  Filled 2015-06-21: qty 3.25

## 2015-06-21 MED ORDER — ONDANSETRON HCL 8 MG PO TABS
ORAL_TABLET | ORAL | Status: AC
  Filled 2015-06-21: qty 1

## 2015-06-21 NOTE — Progress Notes (Signed)
[place] [phone]   [phone]  OFFICE PROGRESS NOTE  [name], MD [address] [place] [address]  DIAGNOSIS: MGUS diagnosed in September 2010, with additional symptoms suggestive of POEMS syndrome.   PRIOR THERAPY: Velcade 1.3 MG/M2 subcutaneously with Decadron 40 mg by mouth on a weekly basis. First cycle 11/24/2013. She status post 31 weekly doses of treatment.   CURRENT THERAPY: The patient will resume treatment again with Velcade 1.3 MG/M2 subcutaneously and weekly basis with Decadron 40 mg by mouth weekly. First dose 02/01/2015. Status post 19 cycles.   INTERVAL HISTORY: Brenda Conner 56 y.o. female returns to the clinic today for followup visit. The patient is feeling fine today with no specific complaints. She is tolerating her treatment with subcutaneous Velcade and Decadron fairly well. She denied having any significant weight loss or night sweats. The patient denied having any fever or chills. She has no chest pain, shortness of breath, cough or hemoptysis. She had repeat myeloma panel performed earlier today and she is here for evaluation and discussion of her lab results.  MEDICAL HISTORY: Past Medical History  Diagnosis Date  . PONV (postoperative nausea and vomiting)   . Hypertension   . Anxiety   . Depression   . Asthma   . Sleep apnea     CPAP at bedtime  . Cancer [facility])     waldenstroms/ macroglobinulemia  . Hypercholesteremia   . Hypothyroidism   . Depression   . Macroglobulinemia ([place])     ? POEMS syndrome  . Acid reflux   . Multiple myeloma ([place])   . Diabetes mellitus without complication ([place])   . Obesity   . Sleep apnea     ALLERGIES:  is allergic to codeine; hydrocodone; lortab; onion; shellfish allergy; and amoxicillin.  MEDICATIONS:  Current Outpatient Prescriptions  Medication Sig Dispense Refill  . acyclovir (ZOVIRAX) 400 MG tablet Take 1 tablet (400 mg total) by mouth 2 (two)  times daily. 60 tablet 3  . ALPRAZolam (XANAX) 0.5 MG tablet Take 1 tablet (0.5 mg total) by mouth at bedtime as needed for anxiety. 30 tablet 0  . aspirin 81 MG tablet Take 81 mg by mouth daily.    . Azelastine HCl 0.15 % SOLN as needed.    . B-D UF III MINI PEN NEEDLES 31G X 5 MM MISC     . Blood Glucose Monitoring Suppl (ONE TOUCH ULTRA MINI) W/DEVICE KIT See admin instructions. Reported on 01/25/2015  0  . bortezomib IV (VELCADE) 3.5 MG injection Inject into the vein once.    [name] dexamethasone (DECADRON) 4 MG tablet 10 tablet by mouth every week. Start with the first dose of chemotherapy. 40 tablet 3  . EPIPEN 2-PAK 0.3 MG/0.3ML SOAJ injection Reported on 05/31/2015  1  . Fluticasone-Salmeterol (ADVAIR) 100-50 MCG/DOSE AEPB Inhale 2 puffs into the lungs every 12 (twelve) hours.    [name] ibuprofen (ADVIL,MOTRIN) 100 MG tablet Take 100 mg by mouth every 6 (six) hours as needed. Reported on 05/31/2015    . insulin glargine (LANTUS) 100 UNIT/ML injection Inject 8 Units into the skin 2 (two) times a week. Reported on 02/18/2015    . levocetirizine (XYZAL) 5 MG tablet Take 5 mg by mouth every evening.    [name] levothyroxine (SYNTHROID, LEVOTHROID) 150 MCG tablet Take 150 mcg by mouth daily before breakfast.    . lisinopril (PRINIVIL,ZESTRIL) 10 MG tablet Take 10 mg by mouth daily.  3  .  metFORMIN (GLUCOPHAGE) 1000 MG tablet Take 1,000 mg by mouth 2 (two) times daily.  3  . NOVOLOG FLEXPEN 100 UNIT/ML FlexPen 3 Units daily as needed. Reported on 01/25/2015  6  . omeprazole (PRILOSEC) 40 MG capsule Take 40 mg by mouth 2 (two) times daily.     . ondansetron (ZOFRAN-ODT) 8 MG disintegrating tablet Take 8 mg by mouth every 8 (eight) hours as needed. Reported on 05/31/2015  2  . ONE TOUCH ULTRA TEST test strip See admin instructions. Reported on 01/25/2015  11  . pravastatin (PRAVACHOL) 40 MG tablet Take 40 mg by mouth every evening.     [name] PROAIR HFA 108 (90 Base) MCG/ACT inhaler Reported on 05/31/2015  1  . sertraline  (ZOLOFT) 50 MG tablet Take 3 tablets (150 mg total) by mouth daily. 270 tablet 4  . verapamil (CALAN-SR) 240 MG CR tablet Take 240 mg by mouth 2 (two) times daily.     No current facility-administered medications for this visit.    SURGICAL HISTORY:  Past Surgical History  Procedure Laterality Date  . Cholecystectomy    . Back surgery  03/2004    herniation, L4-L5  . Bil laprascopic knee surgery    . Uterine fibroid embolization    . Bone marrow biopsy      2011  . Laparoscopic cholecystectomy  1997  . Foot surgery  1998  . Nasal sinus surgery  2004  . Ablation  09/2007    HTA and polyp resection  . Knee arthroscopy w/ meniscal repair  10/10 ; 3/11  . Bone marrow biopsy  4/14    REVIEW OF SYSTEMS:  A comprehensive review of systems was negative except for: Constitutional: positive for fatigue   PHYSICAL EXAMINATION: General appearance: alert, cooperative and no distress Head: Normocephalic, without obvious abnormality, atraumatic Neck: no adenopathy Lymph nodes: Cervical, supraclavicular, and axillary nodes normal. Resp: clear to auscultation bilaterally Back: symmetric, no curvature. ROM normal. No CVA tenderness. Cardio: regular rate and rhythm, S1, S2 normal, no murmur, click, rub or gallop GI: soft, non-tender; bowel sounds normal; no masses,  no organomegaly Extremities: extremities normal, atraumatic, no cyanosis or edema Neurologic: Alert and oriented X 3, normal strength and tone. Normal symmetric reflexes. Normal coordination and gait  ECOG PERFORMANCE STATUS: 0 - Asymptomatic  There were no vitals taken for this visit.  LABORATORY DATA: Lab Results  Component Value Date   WBC 6.1 06/21/2015   HGB 10.1* 06/21/2015   HCT 31.1* 06/21/2015   MCV 84.8 06/21/2015   PLT 166 06/21/2015      Chemistry      Component Value Date/Time   NA 138 06/14/2015 0906   NA 137 03/20/2011 1509   K 4.2 06/14/2015 0906   K 3.4* 03/20/2011 1509   CL 104 04/07/2012 1415   CL  102 03/20/2011 1509   CO2 24 06/14/2015 0906   CO2 26 03/20/2011 1509   BUN 12.6 06/14/2015 0906   BUN 7 03/20/2011 1509   CREATININE 0.8 06/14/2015 0906   CREATININE 0.63 03/20/2011 1509      Component Value Date/Time   CALCIUM 9.3 06/14/2015 0906   CALCIUM 9.2 03/20/2011 1509   ALKPHOS 75 06/14/2015 0906   ALKPHOS 74 03/20/2011 1509   AST 35* 06/14/2015 0906   AST 24 03/20/2011 1509   ALT 41 06/14/2015 0906   ALT 26 03/20/2011 1509   BILITOT 0.45 06/14/2015 0906   BILITOT 0.3 03/20/2011 1509     Other lab results: Beta-2  microglobulin 1.7, free kappa light chain 9.36, free lambda light chain 1014.02 with a kappa/lambda ratio of 0.01. IgG 354, IgA 79 and IgM 604.  ASSESSMENT AND PLAN: This is a very pleasant 55 years old white female with a multiple myeloma initially diagnosed as monoclonal gammopathy in addition to peripheral neuropathy, and Endocrinopathy in the form of hypothyroidism and skin rash. The symptoms combined are suspicious for POEMS syndrome.  She completed treatment with subcutaneous tenderness Velcade and Decadron status post 31 cycles and tolerated her treatment fairly well. She has been on observation for the last 6 months. 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 She resumed her treatment with subcutaneous Velcade and Decadron status post 16 cycles. She is tolerating the treatment well. CBC today is unremarkable except for persistent mild anemia. The myeloma panel performed earlier today is still pending. I recommended for the patient to proceed with cycle #20 of her treatment with Velcade and Decadron today as a scheduled.  If the myeloma panel showed stable on improvement of her disease, the patient will take a break off treatment for at least 2 months followed by repeat myeloma panel, otherwise she will continue with her current treatment with weekly  Velcade and Decadron. She will call in the next few days for the results of her pending blood work and to plan the next follow-up visit. She was advised to call immediately if she has any concerning symptoms in the interval.  All questions were answered. The patient knows to call the clinic with any problems, questions or concerns. We can certainly see the patient much sooner if necessary.  Disclaimer: This note was dictated with voice recognition software. Similar sounding words can inadvertently be transcribed and may not be corrected upon review.

## 2015-06-21 NOTE — Patient Instructions (Signed)
[facility] Discharge Instructions for Patients Receiving Chemotherapy  Today you received the following chemotherapy agents:  Velcade  To help prevent nausea and vomiting after your treatment, we encourage you to take your nausea medication as prescribed.   If you develop nausea and vomiting that is not controlled by your nausea medication, call the clinic.   BELOW ARE SYMPTOMS THAT SHOULD BE REPORTED IMMEDIATELY:  *FEVER GREATER THAN 100.5 F  *CHILLS WITH OR WITHOUT FEVER  NAUSEA AND VOMITING THAT IS NOT CONTROLLED WITH YOUR NAUSEA MEDICATION  *UNUSUAL SHORTNESS OF BREATH  *UNUSUAL BRUISING OR BLEEDING  TENDERNESS IN MOUTH AND THROAT WITH OR WITHOUT PRESENCE OF ULCERS  *URINARY PROBLEMS  *BOWEL PROBLEMS  UNUSUAL RASH Items with * indicate a potential emergency and should be followed up as soon as possible.  Feel free to call the clinic you have any questions or concerns. The clinic phone number is [phone].  Please show the CHEMO ALERT CARD at check-in to the Emergency Department and triage nurse.   

## 2015-06-22 ENCOUNTER — Telehealth: Payer: Self-pay | Admitting: *Deleted

## 2015-06-22 ENCOUNTER — Other Ambulatory Visit: Payer: Self-pay | Admitting: Internal Medicine

## 2015-06-22 ENCOUNTER — Telehealth: Payer: Self-pay | Admitting: Internal Medicine

## 2015-06-22 DIAGNOSIS — C9000 Multiple myeloma not having achieved remission: Secondary | ICD-10-CM

## 2015-06-22 LAB — IGG, IGA, IGM
IGA/IMMUNOGLOBULIN A, SERUM: 68 mg/dL — AB (ref 87–352)
IGG (IMMUNOGLOBIN G), SERUM: 313 mg/dL — AB (ref 700–1600)
IgM, Qn, Serum: 544 mg/dL — ABNORMAL HIGH (ref 26–217)

## 2015-06-22 LAB — KAPPA/LAMBDA LIGHT CHAINS
IG LAMBDA FREE LIGHT CHAIN: 790 mg/L — AB (ref 5.7–26.3)
Ig Kappa Free Light Chain: 10.3 mg/L (ref 3.3–19.4)
Kappa/Lambda FluidC Ratio: 0.01 — ABNORMAL LOW (ref 0.26–1.65)

## 2015-06-22 LAB — BETA 2 MICROGLOBULIN, SERUM: BETA 2: 1.9 mg/L (ref 0.6–2.4)

## 2015-06-22 NOTE — Progress Notes (Signed)
Quick Note:  Call patient with the result. Lab is good. Ok to follow up in 2 months with lab. Hold Chemo until next visit in 2 months. ______

## 2015-06-22 NOTE — Telephone Encounter (Signed)
left msg confirming 8/9 & 8/16 apt

## 2015-06-22 NOTE — Telephone Encounter (Signed)
-----   Message from Curt Bears, MD sent at 06/22/2015 12:37 PM EDT ----- Call patient with the result. Lab is good. Ok to follow up in 2 months with lab. Hold Chemo until next visit in 2 months.

## 2015-06-22 NOTE — Telephone Encounter (Signed)
Notified pt of lab results hold chemo x 65months, lmovm with next appt for lab 8/9 0930 and MD visit 8/16 0930. Requested call back to confirm message rcvd

## 2015-06-24 DIAGNOSIS — J4530 Mild persistent asthma, uncomplicated: Secondary | ICD-10-CM | POA: Diagnosis not present

## 2015-06-24 DIAGNOSIS — J301 Allergic rhinitis due to pollen: Secondary | ICD-10-CM | POA: Diagnosis not present

## 2015-06-24 DIAGNOSIS — J3089 Other allergic rhinitis: Secondary | ICD-10-CM | POA: Diagnosis not present

## 2015-06-24 DIAGNOSIS — J3081 Allergic rhinitis due to animal (cat) (dog) hair and dander: Secondary | ICD-10-CM | POA: Diagnosis not present

## 2015-06-28 ENCOUNTER — Ambulatory Visit: Payer: BLUE CROSS/BLUE SHIELD

## 2015-06-28 ENCOUNTER — Other Ambulatory Visit: Payer: BLUE CROSS/BLUE SHIELD

## 2015-07-05 ENCOUNTER — Ambulatory Visit: Payer: BLUE CROSS/BLUE SHIELD

## 2015-07-05 ENCOUNTER — Other Ambulatory Visit: Payer: BLUE CROSS/BLUE SHIELD

## 2015-07-09 DIAGNOSIS — M4003 Postural kyphosis, cervicothoracic region: Secondary | ICD-10-CM | POA: Diagnosis not present

## 2015-07-09 DIAGNOSIS — M542 Cervicalgia: Secondary | ICD-10-CM | POA: Diagnosis not present

## 2015-07-13 ENCOUNTER — Other Ambulatory Visit: Payer: BLUE CROSS/BLUE SHIELD

## 2015-07-13 ENCOUNTER — Ambulatory Visit: Payer: BLUE CROSS/BLUE SHIELD

## 2015-07-19 ENCOUNTER — Other Ambulatory Visit: Payer: BLUE CROSS/BLUE SHIELD

## 2015-07-23 DIAGNOSIS — M79604 Pain in right leg: Secondary | ICD-10-CM | POA: Diagnosis not present

## 2015-07-23 DIAGNOSIS — M542 Cervicalgia: Secondary | ICD-10-CM | POA: Diagnosis not present

## 2015-07-23 DIAGNOSIS — M4003 Postural kyphosis, cervicothoracic region: Secondary | ICD-10-CM | POA: Diagnosis not present

## 2015-07-23 DIAGNOSIS — M79605 Pain in left leg: Secondary | ICD-10-CM | POA: Diagnosis not present

## 2015-07-30 DIAGNOSIS — M79605 Pain in left leg: Secondary | ICD-10-CM | POA: Diagnosis not present

## 2015-07-30 DIAGNOSIS — M542 Cervicalgia: Secondary | ICD-10-CM | POA: Diagnosis not present

## 2015-08-01 DIAGNOSIS — E039 Hypothyroidism, unspecified: Secondary | ICD-10-CM | POA: Diagnosis not present

## 2015-08-01 DIAGNOSIS — E7800 Pure hypercholesterolemia, unspecified: Secondary | ICD-10-CM | POA: Diagnosis not present

## 2015-08-01 DIAGNOSIS — E1165 Type 2 diabetes mellitus with hyperglycemia: Secondary | ICD-10-CM | POA: Diagnosis not present

## 2015-08-01 DIAGNOSIS — G609 Hereditary and idiopathic neuropathy, unspecified: Secondary | ICD-10-CM | POA: Diagnosis not present

## 2015-08-06 DIAGNOSIS — M79604 Pain in right leg: Secondary | ICD-10-CM | POA: Diagnosis not present

## 2015-08-06 DIAGNOSIS — M542 Cervicalgia: Secondary | ICD-10-CM | POA: Diagnosis not present

## 2015-08-06 DIAGNOSIS — M4003 Postural kyphosis, cervicothoracic region: Secondary | ICD-10-CM | POA: Diagnosis not present

## 2015-08-06 DIAGNOSIS — M79605 Pain in left leg: Secondary | ICD-10-CM | POA: Diagnosis not present

## 2015-08-11 DIAGNOSIS — G4733 Obstructive sleep apnea (adult) (pediatric): Secondary | ICD-10-CM | POA: Diagnosis not present

## 2015-08-13 DIAGNOSIS — M79605 Pain in left leg: Secondary | ICD-10-CM | POA: Diagnosis not present

## 2015-08-13 DIAGNOSIS — M542 Cervicalgia: Secondary | ICD-10-CM | POA: Diagnosis not present

## 2015-08-13 DIAGNOSIS — M4003 Postural kyphosis, cervicothoracic region: Secondary | ICD-10-CM | POA: Diagnosis not present

## 2015-08-17 ENCOUNTER — Other Ambulatory Visit (HOSPITAL_BASED_OUTPATIENT_CLINIC_OR_DEPARTMENT_OTHER): Payer: BLUE CROSS/BLUE SHIELD

## 2015-08-17 DIAGNOSIS — C9000 Multiple myeloma not having achieved remission: Secondary | ICD-10-CM

## 2015-08-17 LAB — COMPREHENSIVE METABOLIC PANEL
ALBUMIN: 3.8 g/dL (ref 3.5–5.0)
ALK PHOS: 85 U/L (ref 40–150)
ALT: 43 U/L (ref 0–55)
ANION GAP: 11 meq/L (ref 3–11)
AST: 51 U/L — ABNORMAL HIGH (ref 5–34)
BILIRUBIN TOTAL: 0.38 mg/dL (ref 0.20–1.20)
BUN: 11.2 mg/dL (ref 7.0–26.0)
CO2: 21 mEq/L — ABNORMAL LOW (ref 22–29)
Calcium: 9.5 mg/dL (ref 8.4–10.4)
Chloride: 107 mEq/L (ref 98–109)
Creatinine: 0.7 mg/dL (ref 0.6–1.1)
GLUCOSE: 120 mg/dL (ref 70–140)
Potassium: 4 mEq/L (ref 3.5–5.1)
SODIUM: 140 meq/L (ref 136–145)
TOTAL PROTEIN: 7.2 g/dL (ref 6.4–8.3)

## 2015-08-17 LAB — CBC WITH DIFFERENTIAL/PLATELET
BASO%: 0.7 % (ref 0.0–2.0)
Basophils Absolute: 0 10*3/uL (ref 0.0–0.1)
EOS%: 3.3 % (ref 0.0–7.0)
Eosinophils Absolute: 0.2 10*3/uL (ref 0.0–0.5)
HCT: 32 % — ABNORMAL LOW (ref 34.8–46.6)
HEMOGLOBIN: 10.5 g/dL — AB (ref 11.6–15.9)
LYMPH#: 1 10*3/uL (ref 0.9–3.3)
LYMPH%: 16.7 % (ref 14.0–49.7)
MCH: 27.6 pg (ref 25.1–34.0)
MCHC: 32.9 g/dL (ref 31.5–36.0)
MCV: 83.7 fL (ref 79.5–101.0)
MONO#: 0.4 10*3/uL (ref 0.1–0.9)
MONO%: 6.3 % (ref 0.0–14.0)
NEUT%: 73 % (ref 38.4–76.8)
NEUTROS ABS: 4.2 10*3/uL (ref 1.5–6.5)
PLATELETS: 179 10*3/uL (ref 145–400)
RBC: 3.82 10*6/uL (ref 3.70–5.45)
RDW: 14.9 % — AB (ref 11.2–14.5)
WBC: 5.7 10*3/uL (ref 3.9–10.3)

## 2015-08-17 LAB — LACTATE DEHYDROGENASE: LDH: 151 U/L (ref 125–245)

## 2015-08-18 LAB — IGG, IGA, IGM
IGA/IMMUNOGLOBULIN A, SERUM: 87 mg/dL (ref 87–352)
IGG (IMMUNOGLOBIN G), SERUM: 393 mg/dL — AB (ref 700–1600)
IgM, Qn, Serum: 688 mg/dL — ABNORMAL HIGH (ref 26–217)

## 2015-08-18 LAB — BETA 2 MICROGLOBULIN, SERUM: BETA 2: 2 mg/L (ref 0.6–2.4)

## 2015-08-18 LAB — KAPPA/LAMBDA LIGHT CHAINS
Ig Kappa Free Light Chain: 9.7 mg/L (ref 3.3–19.4)
Ig Lambda Free Light Chain: 857.4 mg/L — ABNORMAL HIGH (ref 5.7–26.3)
Kappa/Lambda FluidC Ratio: 0.01 — ABNORMAL LOW (ref 0.26–1.65)

## 2015-08-20 DIAGNOSIS — M79605 Pain in left leg: Secondary | ICD-10-CM | POA: Diagnosis not present

## 2015-08-20 DIAGNOSIS — M542 Cervicalgia: Secondary | ICD-10-CM | POA: Diagnosis not present

## 2015-08-24 ENCOUNTER — Ambulatory Visit (HOSPITAL_BASED_OUTPATIENT_CLINIC_OR_DEPARTMENT_OTHER): Payer: BLUE CROSS/BLUE SHIELD | Admitting: Internal Medicine

## 2015-08-24 ENCOUNTER — Encounter: Payer: Self-pay | Admitting: Internal Medicine

## 2015-08-24 VITALS — BP 122/88 | HR 77 | Temp 98.2°F | Resp 18 | Ht 67.0 in | Wt 264.4 lb

## 2015-08-24 DIAGNOSIS — C9000 Multiple myeloma not having achieved remission: Secondary | ICD-10-CM

## 2015-08-24 DIAGNOSIS — E8809 Other disorders of plasma-protein metabolism, not elsewhere classified: Secondary | ICD-10-CM

## 2015-08-24 NOTE — Progress Notes (Signed)
[place] [phone]   [phone]  OFFICE PROGRESS NOTE  Brenda Kern, MD [place] [address]  DIAGNOSIS: MGUS diagnosed in September 2010, with additional symptoms suggestive of POEMS syndrome.   PRIOR THERAPY: Velcade 1.3 MG/M2 subcutaneously with Decadron 40 mg by mouth on a weekly basis. First cycle 11/24/2013. She status post 31 weekly doses of treatment.   CURRENT THERAPY: The patient will resume treatment again with Velcade 1.3 MG/M2 subcutaneously and weekly basis with Decadron 40 mg by mouth weekly. First dose 02/01/2015. Status post 20 cycles.   INTERVAL HISTORY: Brenda Conner 55 y.o. female returns to the clinic today for followup visit. The patient is feeling fine today with no specific complaints except for mild fatigue and arthralgia that was getting worse after she was off her treatment. She has been on a break off chemotherapy for the last 2 months. She denied having any significant weight loss or night sweats. The patient denied having any fever or chills. She has no chest pain, shortness of breath, cough or hemoptysis. She had repeat myeloma panel performed earlier today and she is here for evaluation and discussion of her lab results.  MEDICAL HISTORY: Past Medical History:  Diagnosis Date  . Acid reflux   . Anxiety   . Asthma   . Cancer [facility])    waldenstroms/ macroglobinulemia  . Depression   . Depression   . Diabetes mellitus without complication ([place])   . Hypercholesteremia   . Hypertension   . Hypothyroidism   . Macroglobulinemia ([place])    ? POEMS syndrome  . Multiple myeloma ([place])   . Obesity   . PONV (postoperative nausea and vomiting)   . Sleep apnea    CPAP at bedtime  . Sleep apnea     ALLERGIES:  is allergic to codeine; hydrocodone; lortab [hydrocodone-acetaminophen]; onion; shellfish allergy; and amoxicillin.  MEDICATIONS:  Current Outpatient Prescriptions  Medication Sig  Dispense Refill  . ALPRAZolam (XANAX) 0.5 MG tablet Take 1 tablet (0.5 mg total) by mouth at bedtime as needed for anxiety. 30 tablet 0  . aspirin 81 MG tablet Take 81 mg by mouth daily.    . Azelastine HCl 0.15 % SOLN as needed.    . B-D UF III MINI PEN NEEDLES 31G X 5 MM MISC     . Blood Glucose Monitoring Suppl (ONE TOUCH ULTRA MINI) W/DEVICE KIT See admin instructions. Reported on 01/25/2015  0  . bortezomib IV (VELCADE) 3.5 MG injection Inject into the vein once.    . Cetirizine HCl (ZYRTEC ALLERGY PO) Take by mouth daily.    [name] dexamethasone (DECADRON) 4 MG tablet 10 tablet by mouth every week. Start with the first dose of chemotherapy. 40 tablet 3  . DiphenhydrAMINE HCl (BENADRYL ALLERGY PO) Take by mouth as needed.    [name] EPIPEN 2-PAK 0.3 MG/0.3ML SOAJ injection Reported on 06/21/2015  1  . Fexofenadine HCl (ALLEGRA ALLERGY PO) Take by mouth daily.    . Fluticasone-Salmeterol (ADVAIR) 100-50 MCG/DOSE AEPB Inhale 2 puffs into the lungs every 12 (twelve) hours.    [name] ibuprofen (ADVIL,MOTRIN) 100 MG tablet Take 100 mg by mouth every 6 (six) hours as needed. Reported on 05/31/2015    . levothyroxine (SYNTHROID, LEVOTHROID) 150 MCG tablet Take 150 mcg by mouth daily before breakfast.    . lisinopril (PRINIVIL,ZESTRIL) 10 MG tablet Take 10 mg by mouth daily.  3  . metFORMIN (GLUCOPHAGE) 1000 MG tablet Take 1,000  mg by mouth 2 (two) times daily.  3  . omeprazole (PRILOSEC) 40 MG capsule Take 40 mg by mouth 2 (two) times daily.     . ONE TOUCH ULTRA TEST test strip See admin instructions. Reported on 01/25/2015  11  . pravastatin (PRAVACHOL) 40 MG tablet Take 40 mg by mouth every evening.     [name] PROAIR HFA 108 (90 Base) MCG/ACT inhaler Reported on 06/21/2015  1  . sertraline (ZOLOFT) 50 MG tablet Take 3 tablets (150 mg total) by mouth daily. 270 tablet 4  . verapamil (CALAN-SR) 240 MG CR tablet Take 240 mg by mouth 2 (two) times daily.    [name] acyclovir (ZOVIRAX) 400 MG tablet Take 1 tablet (400 mg total)  by mouth 2 (two) times daily. (Patient not taking: Reported on 08/24/2015) 60 tablet 3  . insulin glargine (LANTUS) 100 UNIT/ML injection Inject 8 Units into the skin 2 (two) times a week. Reported on 02/18/2015    . levocetirizine (XYZAL) 5 MG tablet Take 5 mg by mouth every evening.    [name] NOVOLOG FLEXPEN 100 UNIT/ML FlexPen 3 Units daily as needed. Reported on 01/25/2015  6  . ondansetron (ZOFRAN-ODT) 8 MG disintegrating tablet Take 8 mg by mouth every 8 (eight) hours as needed. Reported on 05/31/2015  2   No current facility-administered medications for this visit.     SURGICAL HISTORY:  Past Surgical History:  Procedure Laterality Date  . ABLATION  09/2007   HTA and polyp resection  . BACK SURGERY  03/2004   herniation, L4-L5  . Bil Laprascopic knee surgery    . BONE MARROW BIOPSY     2011  . BONE MARROW BIOPSY  4/14  . CHOLECYSTECTOMY    . FOOT SURGERY  1998  . KNEE ARTHROSCOPY W/ MENISCAL REPAIR  10/10 ; 3/11  . LAPAROSCOPIC CHOLECYSTECTOMY  1997  . NASAL SINUS SURGERY  2004  . UTERINE FIBROID EMBOLIZATION      REVIEW OF SYSTEMS:  Constitutional: positive for fatigue Eyes: negative Ears, nose, mouth, throat, and face: negative Respiratory: negative Cardiovascular: negative Gastrointestinal: negative Genitourinary:negative Integument/breast: negative Hematologic/lymphatic: negative Musculoskeletal:positive for arthralgias Neurological: negative Behavioral/Psych: negative Endocrine: negative Allergic/Immunologic: negative   PHYSICAL EXAMINATION: General appearance: alert, cooperative and no distress Head: Normocephalic, without obvious abnormality, atraumatic Neck: no adenopathy Lymph nodes: Cervical, supraclavicular, and axillary nodes normal. Resp: clear to auscultation bilaterally Back: symmetric, no curvature. ROM normal. No CVA tenderness. Cardio: regular rate and rhythm, S1, S2 normal, no murmur, click, rub or gallop GI: soft, non-tender; bowel sounds normal; no  masses,  no organomegaly Extremities: extremities normal, atraumatic, no cyanosis or edema Neurologic: Alert and oriented X 3, normal strength and tone. Normal symmetric reflexes. Normal coordination and gait  ECOG PERFORMANCE STATUS: 0 - Asymptomatic  Blood pressure 122/88, pulse 77, temperature 98.2 F (36.8 C), temperature source Oral, resp. rate 18, height _0  (1.702 m), weight 264 lb 6.4 oz (119.9 kg), SpO2 100 %.  LABORATORY DATA: Lab Results  Component Value Date   WBC 5.7 08/17/2015   HGB 10.5 (L) 08/17/2015   HCT 32.0 (L) 08/17/2015   MCV 83.7 08/17/2015   PLT 179 08/17/2015      Chemistry      Component Value Date/Time   NA 140 08/17/2015 0916   K 4.0 08/17/2015 0916   CL 104 04/07/2012 1415   CO2 21 (L) 08/17/2015 0916   BUN 11.2 08/17/2015 0916   CREATININE 0.7 08/17/2015 0916  Component Value Date/Time   CALCIUM 9.5 08/17/2015 0916   ALKPHOS 85 08/17/2015 0916   AST 51 (H) 08/17/2015 0916   ALT 43 08/17/2015 0916   BILITOT 0.38 08/17/2015 0916     Other lab results: Beta-2 microglobulin 2.0, free kappa light chain 9.7, free lambda light chain 857.4 with a kappa/lambda ratio of 0.01. IgG 393, IgA 87 and IgM 688.  ASSESSMENT AND PLAN: This is a very pleasant 55 years old white female with a multiple myeloma initially diagnosed as monoclonal gammopathy in addition to peripheral neuropathy, and Endocrinopathy in the form of hypothyroidism and skin rash. The symptoms combined are suspicious for POEMS syndrome.  She completed treatment with subcutaneous tenderness Velcade and Decadron status post 31 cycles and tolerated her treatment fairly well. She has been on observation for the last 6 months. 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 She resumed her treatment with subcutaneous Velcade and Decadron status post 20 cycles. She tolerated the previous  treatment well. She has been on observation for the last 2 months. Unfortunately the myeloma panel showed some evidence for disease progression with increase in the free lambda light chain. I recommended for the patient to resume her treatment with weekly Velcade and Decadron with cycle #21 starting next week. I will see her back for follow-up visit in 3 weeks for evaluation and management of any adverse effect of her treatment. She was advised to call immediately if she has any concerning symptoms in the interval.  All questions were answered. The patient knows to call the clinic with any problems, questions or concerns. We can certainly see the patient much sooner if necessary.  Disclaimer: This note was dictated with voice recognition software. Similar sounding words can inadvertently be transcribed and may not be corrected upon review.

## 2015-08-25 ENCOUNTER — Telehealth: Payer: Self-pay | Admitting: Internal Medicine

## 2015-08-25 NOTE — Telephone Encounter (Signed)
LEFT MESSAGE FOR PATIENT RE NEXT APPOINTMENT FOR 8/22. PATIENT TO CONFIRM APPOINTMENTS ON MYCHART. MESSAGE RECEIVED FROM DESK NURSE RE PATIENT INQUIRING ABOUT APPOINTMENTS. LOS NOT COMPLETE UPON CHECK 8/17 AND PATIENT WAS MADE AWARE SHE WOULD BE CONTACTED RE APPOINTMENTS.

## 2015-08-29 DIAGNOSIS — M542 Cervicalgia: Secondary | ICD-10-CM | POA: Diagnosis not present

## 2015-08-29 DIAGNOSIS — M79605 Pain in left leg: Secondary | ICD-10-CM | POA: Diagnosis not present

## 2015-08-29 DIAGNOSIS — M4003 Postural kyphosis, cervicothoracic region: Secondary | ICD-10-CM | POA: Diagnosis not present

## 2015-08-30 ENCOUNTER — Other Ambulatory Visit (HOSPITAL_BASED_OUTPATIENT_CLINIC_OR_DEPARTMENT_OTHER): Payer: BLUE CROSS/BLUE SHIELD

## 2015-08-30 ENCOUNTER — Ambulatory Visit (HOSPITAL_BASED_OUTPATIENT_CLINIC_OR_DEPARTMENT_OTHER): Payer: BLUE CROSS/BLUE SHIELD

## 2015-08-30 VITALS — BP 147/69 | HR 70 | Temp 98.1°F | Resp 17

## 2015-08-30 DIAGNOSIS — C9000 Multiple myeloma not having achieved remission: Secondary | ICD-10-CM

## 2015-08-30 DIAGNOSIS — Z5111 Encounter for antineoplastic chemotherapy: Secondary | ICD-10-CM | POA: Diagnosis not present

## 2015-08-30 LAB — CBC WITH DIFFERENTIAL/PLATELET
BASO%: 0.6 % (ref 0.0–2.0)
Basophils Absolute: 0 10*3/uL (ref 0.0–0.1)
EOS%: 2 % (ref 0.0–7.0)
Eosinophils Absolute: 0.1 10*3/uL (ref 0.0–0.5)
HCT: 33.7 % — ABNORMAL LOW (ref 34.8–46.6)
HGB: 10.9 g/dL — ABNORMAL LOW (ref 11.6–15.9)
LYMPH%: 9.3 % — AB (ref 14.0–49.7)
MCH: 27.1 pg (ref 25.1–34.0)
MCHC: 32.4 g/dL (ref 31.5–36.0)
MCV: 83.4 fL (ref 79.5–101.0)
MONO#: 0.2 10*3/uL (ref 0.1–0.9)
MONO%: 2.8 % (ref 0.0–14.0)
NEUT#: 5.5 10*3/uL (ref 1.5–6.5)
NEUT%: 85.3 % — AB (ref 38.4–76.8)
Platelets: 211 10*3/uL (ref 145–400)
RBC: 4.03 10*6/uL (ref 3.70–5.45)
RDW: 15.1 % — AB (ref 11.2–14.5)
WBC: 6.5 10*3/uL (ref 3.9–10.3)
lymph#: 0.6 10*3/uL — ABNORMAL LOW (ref 0.9–3.3)

## 2015-08-30 LAB — COMPREHENSIVE METABOLIC PANEL
ALT: 44 U/L (ref 0–55)
AST: 54 U/L — AB (ref 5–34)
Albumin: 3.7 g/dL (ref 3.5–5.0)
Alkaline Phosphatase: 98 U/L (ref 40–150)
Anion Gap: 12 mEq/L — ABNORMAL HIGH (ref 3–11)
BUN: 9.4 mg/dL (ref 7.0–26.0)
CHLORIDE: 101 meq/L (ref 98–109)
CO2: 21 mEq/L — ABNORMAL LOW (ref 22–29)
Calcium: 9.7 mg/dL (ref 8.4–10.4)
Creatinine: 0.8 mg/dL (ref 0.6–1.1)
EGFR: 84 mL/min/{1.73_m2} — AB (ref >90)
GLUCOSE: 153 mg/dL — AB (ref 70–140)
POTASSIUM: 4.4 meq/L (ref 3.5–5.1)
SODIUM: 134 meq/L — AB (ref 136–145)
Total Bilirubin: 0.4 mg/dL (ref 0.20–1.20)
Total Protein: 7.4 g/dL (ref 6.4–8.3)

## 2015-08-30 MED ORDER — ONDANSETRON HCL 8 MG PO TABS
ORAL_TABLET | ORAL | Status: AC
  Filled 2015-08-30: qty 1

## 2015-08-30 MED ORDER — BORTEZOMIB CHEMO SQ INJECTION 3.5 MG (2.5MG/ML)
1.3000 mg/m2 | Freq: Once | INTRAMUSCULAR | Status: AC
  Administered 2015-08-30: 3.25 mg via SUBCUTANEOUS
  Filled 2015-08-30: qty 3.25

## 2015-08-30 MED ORDER — ONDANSETRON HCL 8 MG PO TABS
8.0000 mg | ORAL_TABLET | Freq: Once | ORAL | Status: AC
  Administered 2015-08-30: 8 mg via ORAL

## 2015-08-30 NOTE — Patient Instructions (Signed)
[place] Discharge Instructions for Patients Receiving Chemotherapy  Today you received the following chemotherapy biotherapy agents:  Velcade   To help prevent nausea and vomiting after your treatment, we encourage you to take your nausea medication as prescribed.   If you develop nausea and vomiting that is not controlled by your nausea medication, call the clinic.   BELOW ARE SYMPTOMS THAT SHOULD BE REPORTED IMMEDIATELY:  *FEVER GREATER THAN 100.5 F  *CHILLS WITH OR WITHOUT FEVER  NAUSEA AND VOMITING THAT IS NOT CONTROLLED WITH YOUR NAUSEA MEDICATION  *UNUSUAL SHORTNESS OF BREATH  *UNUSUAL BRUISING OR BLEEDING  TENDERNESS IN MOUTH AND THROAT WITH OR WITHOUT PRESENCE OF ULCERS  *URINARY PROBLEMS  *BOWEL PROBLEMS  UNUSUAL RASH Items with * indicate a potential emergency and should be followed up as soon as possible.  Feel free to call the clinic you have any questions or concerns. The clinic phone number is [phone].  Please show the [place] at check-in to the Emergency Department and triage nurse.

## 2015-09-06 ENCOUNTER — Other Ambulatory Visit (HOSPITAL_BASED_OUTPATIENT_CLINIC_OR_DEPARTMENT_OTHER): Payer: BLUE CROSS/BLUE SHIELD

## 2015-09-06 ENCOUNTER — Ambulatory Visit (HOSPITAL_BASED_OUTPATIENT_CLINIC_OR_DEPARTMENT_OTHER): Payer: BLUE CROSS/BLUE SHIELD

## 2015-09-06 VITALS — BP 121/62 | HR 71 | Temp 98.2°F | Resp 16

## 2015-09-06 DIAGNOSIS — Z5112 Encounter for antineoplastic immunotherapy: Secondary | ICD-10-CM | POA: Diagnosis not present

## 2015-09-06 DIAGNOSIS — C9000 Multiple myeloma not having achieved remission: Secondary | ICD-10-CM

## 2015-09-06 LAB — COMPREHENSIVE METABOLIC PANEL
ALT: 63 U/L — AB (ref 0–55)
AST: 46 U/L — AB (ref 5–34)
Albumin: 3.6 g/dL (ref 3.5–5.0)
Alkaline Phosphatase: 121 U/L (ref 40–150)
Anion Gap: 12 mEq/L — ABNORMAL HIGH (ref 3–11)
BUN: 7.7 mg/dL (ref 7.0–26.0)
CHLORIDE: 104 meq/L (ref 98–109)
CO2: 22 meq/L (ref 22–29)
Calcium: 9.9 mg/dL (ref 8.4–10.4)
Creatinine: 0.8 mg/dL (ref 0.6–1.1)
EGFR: 87 mL/min/{1.73_m2} — ABNORMAL LOW (ref >90)
GLUCOSE: 160 mg/dL — AB (ref 70–140)
POTASSIUM: 4.1 meq/L (ref 3.5–5.1)
SODIUM: 138 meq/L (ref 136–145)
Total Bilirubin: 0.38 mg/dL (ref 0.20–1.20)
Total Protein: 7 g/dL (ref 6.4–8.3)

## 2015-09-06 LAB — CBC WITH DIFFERENTIAL/PLATELET
BASO%: 0.6 % (ref 0.0–2.0)
BASOS ABS: 0 10*3/uL (ref 0.0–0.1)
EOS%: 1.6 % (ref 0.0–7.0)
Eosinophils Absolute: 0.1 10*3/uL (ref 0.0–0.5)
HCT: 32 % — ABNORMAL LOW (ref 34.8–46.6)
HGB: 10.3 g/dL — ABNORMAL LOW (ref 11.6–15.9)
LYMPH%: 9.6 % — AB (ref 14.0–49.7)
MCH: 27 pg (ref 25.1–34.0)
MCHC: 32.3 g/dL (ref 31.5–36.0)
MCV: 83.6 fL (ref 79.5–101.0)
MONO#: 0.2 10*3/uL (ref 0.1–0.9)
MONO%: 4.4 % (ref 0.0–14.0)
NEUT#: 4.6 10*3/uL (ref 1.5–6.5)
NEUT%: 83.8 % — AB (ref 38.4–76.8)
Platelets: 181 10*3/uL (ref 145–400)
RBC: 3.82 10*6/uL (ref 3.70–5.45)
RDW: 15.2 % — ABNORMAL HIGH (ref 11.2–14.5)
WBC: 5.5 10*3/uL (ref 3.9–10.3)
lymph#: 0.5 10*3/uL — ABNORMAL LOW (ref 0.9–3.3)

## 2015-09-06 MED ORDER — ONDANSETRON HCL 8 MG PO TABS
ORAL_TABLET | ORAL | Status: AC
  Filled 2015-09-06: qty 1

## 2015-09-06 MED ORDER — ONDANSETRON HCL 8 MG PO TABS
8.0000 mg | ORAL_TABLET | Freq: Once | ORAL | Status: AC
  Administered 2015-09-06: 8 mg via ORAL

## 2015-09-06 MED ORDER — BORTEZOMIB CHEMO SQ INJECTION 3.5 MG (2.5MG/ML)
1.3000 mg/m2 | Freq: Once | INTRAMUSCULAR | Status: AC
  Administered 2015-09-06: 3.25 mg via SUBCUTANEOUS
  Filled 2015-09-06: qty 3.25

## 2015-09-06 NOTE — Patient Instructions (Signed)
[facility] Discharge Instructions for Patients Receiving Chemotherapy  Today you received the following chemotherapy agents Velcade. To help prevent nausea and vomiting after your treatment, we encourage you to take your nausea medication as directed.  If you develop nausea and vomiting that is not controlled by your nausea medication, call the clinic.   BELOW ARE SYMPTOMS THAT SHOULD BE REPORTED IMMEDIATELY:  *FEVER GREATER THAN 100.5 F  *CHILLS WITH OR WITHOUT FEVER  NAUSEA AND VOMITING THAT IS NOT CONTROLLED WITH YOUR NAUSEA MEDICATION  *UNUSUAL SHORTNESS OF BREATH  *UNUSUAL BRUISING OR BLEEDING  TENDERNESS IN MOUTH AND THROAT WITH OR WITHOUT PRESENCE OF ULCERS  *URINARY PROBLEMS  *BOWEL PROBLEMS  UNUSUAL RASH Items with * indicate a potential emergency and should be followed up as soon as possible.  Feel free to call the clinic you have any questions or concerns. The clinic phone number is [phone].  Please show the CHEMO ALERT CARD at check-in to the Emergency Department and triage nurse.    

## 2015-09-10 DIAGNOSIS — M542 Cervicalgia: Secondary | ICD-10-CM | POA: Diagnosis not present

## 2015-09-10 DIAGNOSIS — M4003 Postural kyphosis, cervicothoracic region: Secondary | ICD-10-CM | POA: Diagnosis not present

## 2015-09-10 DIAGNOSIS — M79605 Pain in left leg: Secondary | ICD-10-CM | POA: Diagnosis not present

## 2015-09-13 ENCOUNTER — Encounter: Payer: Self-pay | Admitting: Internal Medicine

## 2015-09-13 ENCOUNTER — Ambulatory Visit (HOSPITAL_BASED_OUTPATIENT_CLINIC_OR_DEPARTMENT_OTHER): Payer: BLUE CROSS/BLUE SHIELD

## 2015-09-13 ENCOUNTER — Ambulatory Visit (HOSPITAL_BASED_OUTPATIENT_CLINIC_OR_DEPARTMENT_OTHER): Payer: BLUE CROSS/BLUE SHIELD | Admitting: Internal Medicine

## 2015-09-13 ENCOUNTER — Telehealth: Payer: Self-pay | Admitting: Internal Medicine

## 2015-09-13 ENCOUNTER — Other Ambulatory Visit (HOSPITAL_BASED_OUTPATIENT_CLINIC_OR_DEPARTMENT_OTHER): Payer: BLUE CROSS/BLUE SHIELD

## 2015-09-13 VITALS — BP 141/70 | HR 77 | Temp 97.9°F | Resp 18 | Ht 67.0 in | Wt 264.2 lb

## 2015-09-13 DIAGNOSIS — Z5111 Encounter for antineoplastic chemotherapy: Secondary | ICD-10-CM

## 2015-09-13 DIAGNOSIS — Z5112 Encounter for antineoplastic immunotherapy: Secondary | ICD-10-CM | POA: Diagnosis not present

## 2015-09-13 DIAGNOSIS — E8809 Other disorders of plasma-protein metabolism, not elsewhere classified: Secondary | ICD-10-CM

## 2015-09-13 DIAGNOSIS — C9000 Multiple myeloma not having achieved remission: Secondary | ICD-10-CM

## 2015-09-13 LAB — COMPREHENSIVE METABOLIC PANEL
ALT: 48 U/L (ref 0–55)
ANION GAP: 12 meq/L — AB (ref 3–11)
AST: 44 U/L — ABNORMAL HIGH (ref 5–34)
Albumin: 3.6 g/dL (ref 3.5–5.0)
Alkaline Phosphatase: 96 U/L (ref 40–150)
BILIRUBIN TOTAL: 0.4 mg/dL (ref 0.20–1.20)
BUN: 6.7 mg/dL — ABNORMAL LOW (ref 7.0–26.0)
CALCIUM: 9.5 mg/dL (ref 8.4–10.4)
CHLORIDE: 105 meq/L (ref 98–109)
CO2: 21 meq/L — AB (ref 22–29)
CREATININE: 0.8 mg/dL (ref 0.6–1.1)
EGFR: 84 mL/min/{1.73_m2} — AB (ref >90)
Glucose: 158 mg/dl — ABNORMAL HIGH (ref 70–140)
Potassium: 4.5 mEq/L (ref 3.5–5.1)
Sodium: 138 mEq/L (ref 136–145)
TOTAL PROTEIN: 7.1 g/dL (ref 6.4–8.3)

## 2015-09-13 LAB — CBC WITH DIFFERENTIAL/PLATELET
BASO%: 0.4 % (ref 0.0–2.0)
Basophils Absolute: 0 10*3/uL (ref 0.0–0.1)
EOS%: 1.5 % (ref 0.0–7.0)
Eosinophils Absolute: 0.1 10*3/uL (ref 0.0–0.5)
HEMATOCRIT: 33.7 % — AB (ref 34.8–46.6)
HEMOGLOBIN: 10.8 g/dL — AB (ref 11.6–15.9)
LYMPH#: 0.8 10*3/uL — AB (ref 0.9–3.3)
LYMPH%: 10.2 % — ABNORMAL LOW (ref 14.0–49.7)
MCH: 26.8 pg (ref 25.1–34.0)
MCHC: 32.1 g/dL (ref 31.5–36.0)
MCV: 83.5 fL (ref 79.5–101.0)
MONO#: 0.3 10*3/uL (ref 0.1–0.9)
MONO%: 4 % (ref 0.0–14.0)
NEUT#: 6.4 10*3/uL (ref 1.5–6.5)
NEUT%: 83.9 % — ABNORMAL HIGH (ref 38.4–76.8)
Platelets: 194 10*3/uL (ref 145–400)
RBC: 4.03 10*6/uL (ref 3.70–5.45)
RDW: 14.7 % — AB (ref 11.2–14.5)
WBC: 7.7 10*3/uL (ref 3.9–10.3)

## 2015-09-13 MED ORDER — ONDANSETRON HCL 8 MG PO TABS
ORAL_TABLET | ORAL | Status: AC
  Filled 2015-09-13: qty 1

## 2015-09-13 MED ORDER — DEXAMETHASONE 4 MG PO TABS: ORAL_TABLET | ORAL | 3 refills | Status: DC

## 2015-09-13 MED ORDER — BORTEZOMIB CHEMO SQ INJECTION 3.5 MG (2.5MG/ML)
1.3000 mg/m2 | Freq: Once | INTRAMUSCULAR | Status: AC
  Administered 2015-09-13: 3.25 mg via SUBCUTANEOUS
  Filled 2015-09-13: qty 3.25

## 2015-09-13 MED ORDER — ONDANSETRON HCL 8 MG PO TABS
8.0000 mg | ORAL_TABLET | Freq: Once | ORAL | Status: AC
  Administered 2015-09-13: 8 mg via ORAL

## 2015-09-13 NOTE — Telephone Encounter (Signed)
AVS REPORT AND APPT SCHD GIVEN PER 09/13/15 LOS. °

## 2015-09-13 NOTE — Patient Instructions (Signed)
[facility] Discharge Instructions for Patients Receiving Chemotherapy  Today you received the following chemotherapy agents Velcade. To help prevent nausea and vomiting after your treatment, we encourage you to take your nausea medication as directed.  If you develop nausea and vomiting that is not controlled by your nausea medication, call the clinic.   BELOW ARE SYMPTOMS THAT SHOULD BE REPORTED IMMEDIATELY:  *FEVER GREATER THAN 100.5 F  *CHILLS WITH OR WITHOUT FEVER  NAUSEA AND VOMITING THAT IS NOT CONTROLLED WITH YOUR NAUSEA MEDICATION  *UNUSUAL SHORTNESS OF BREATH  *UNUSUAL BRUISING OR BLEEDING  TENDERNESS IN MOUTH AND THROAT WITH OR WITHOUT PRESENCE OF ULCERS  *URINARY PROBLEMS  *BOWEL PROBLEMS  UNUSUAL RASH Items with * indicate a potential emergency and should be followed up as soon as possible.  Feel free to call the clinic you have any questions or concerns. The clinic phone number is [phone].  Please show the CHEMO ALERT CARD at check-in to the Emergency Department and triage nurse.    

## 2015-09-13 NOTE — Progress Notes (Signed)
[place] [phone]   [phone]  OFFICE PROGRESS NOTE  [name], MD [place] [address]  DIAGNOSIS: MGUS diagnosed in September 2010, with additional symptoms suggestive of POEMS syndrome.   PRIOR THERAPY: Velcade 1.3 MG/M2 subcutaneously with Decadron 40 mg by mouth on a weekly basis. First cycle 11/24/2013. She status post 31 weekly doses of treatment.   CURRENT THERAPY: The patient will resume treatment again with Velcade 1.3 MG/M2 subcutaneously and weekly basis with Decadron 40 mg by mouth weekly. First dose 02/01/2015. Status post 22 cycles.   INTERVAL HISTORY: SHALISA MCQUADE 55 y.o. female returns to the clinic today for followup visit. The patient is feeling fine today with no specific complaints except for mild fatigue. She is tolerating her treatment well with no specific complaints. She denied having any significant weight loss or night sweats. The patient denied having any fever or chills. She has no chest pain, shortness of breath, cough or hemoptysis. She is here today for evaluation and repeat blood work.  MEDICAL HISTORY: Past Medical History:  Diagnosis Date  . Acid reflux   . Anxiety   . Asthma   . Cancer [facility])    waldenstroms/ macroglobinulemia  . Depression   . Depression   . Diabetes mellitus without complication ([place])   . Hypercholesteremia   . Hypertension   . Hypothyroidism   . Macroglobulinemia ([place])    ? POEMS syndrome  . Multiple myeloma ([place])   . Obesity   . PONV (postoperative nausea and vomiting)   . Sleep apnea    CPAP at bedtime  . Sleep apnea     ALLERGIES:  is allergic to codeine; hydrocodone; lortab [hydrocodone-acetaminophen]; onion; shellfish allergy; and amoxicillin.  MEDICATIONS:  Current Outpatient Prescriptions  Medication Sig Dispense Refill  . acyclovir (ZOVIRAX) 400 MG tablet Take 1 tablet (400 mg total) by mouth 2 (two) times daily. (Patient not taking:  Reported on 08/24/2015) 60 tablet 3  . ALPRAZolam (XANAX) 0.5 MG tablet Take 1 tablet (0.5 mg total) by mouth at bedtime as needed for anxiety. 30 tablet 0  . aspirin 81 MG tablet Take 81 mg by mouth daily.    . Azelastine HCl 0.15 % SOLN as needed.    . B-D UF III MINI PEN NEEDLES 31G X 5 MM MISC     . Blood Glucose Monitoring Suppl (ONE TOUCH ULTRA MINI) W/DEVICE KIT See admin instructions. Reported on 01/25/2015  0  . bortezomib IV (VELCADE) 3.5 MG injection Inject into the vein once.    . Cetirizine HCl (ZYRTEC ALLERGY PO) Take by mouth daily.    [name] dexamethasone (DECADRON) 4 MG tablet 10 tablet by mouth every week. Start with the first dose of chemotherapy. 40 tablet 3  . DiphenhydrAMINE HCl (BENADRYL ALLERGY PO) Take by mouth as needed.    [name] EPIPEN 2-PAK 0.3 MG/0.3ML SOAJ injection Reported on 06/21/2015  1  . Fexofenadine HCl (ALLEGRA ALLERGY PO) Take by mouth daily.    . Fluticasone-Salmeterol (ADVAIR) 100-50 MCG/DOSE AEPB Inhale 2 puffs into the lungs every 12 (twelve) hours.    [name] ibuprofen (ADVIL,MOTRIN) 100 MG tablet Take 100 mg by mouth every 6 (six) hours as needed. Reported on 05/31/2015    . insulin glargine (LANTUS) 100 UNIT/ML injection Inject 8 Units into the skin 2 (two) times a week. Reported on 02/18/2015    . levocetirizine (XYZAL) 5 MG tablet Take 5 mg by mouth every evening.    [name]  levothyroxine (SYNTHROID, LEVOTHROID) 150 MCG tablet Take 150 mcg by mouth daily before breakfast.    . lisinopril (PRINIVIL,ZESTRIL) 10 MG tablet Take 10 mg by mouth daily.  3  . metFORMIN (GLUCOPHAGE) 1000 MG tablet Take 1,000 mg by mouth 2 (two) times daily.  3  . NOVOLOG FLEXPEN 100 UNIT/ML FlexPen 3 Units daily as needed. Reported on 01/25/2015  6  . omeprazole (PRILOSEC) 40 MG capsule Take 40 mg by mouth 2 (two) times daily.     . ondansetron (ZOFRAN-ODT) 8 MG disintegrating tablet Take 8 mg by mouth every 8 (eight) hours as needed. Reported on 05/31/2015  2  . ONE TOUCH ULTRA TEST test strip See  admin instructions. Reported on 01/25/2015  11  . pravastatin (PRAVACHOL) 40 MG tablet Take 40 mg by mouth every evening.     [name] PROAIR HFA 108 (90 Base) MCG/ACT inhaler Reported on 06/21/2015  1  . sertraline (ZOLOFT) 50 MG tablet Take 3 tablets (150 mg total) by mouth daily. 270 tablet 4  . verapamil (CALAN-SR) 240 MG CR tablet Take 240 mg by mouth 2 (two) times daily.     No current facility-administered medications for this visit.     SURGICAL HISTORY:  Past Surgical History:  Procedure Laterality Date  . ABLATION  09/2007   HTA and polyp resection  . BACK SURGERY  03/2004   herniation, L4-L5  . Bil Laprascopic knee surgery    . BONE MARROW BIOPSY     2011  . BONE MARROW BIOPSY  4/14  . CHOLECYSTECTOMY    . FOOT SURGERY  1998  . KNEE ARTHROSCOPY W/ MENISCAL REPAIR  10/10 ; 3/11  . LAPAROSCOPIC CHOLECYSTECTOMY  1997  . NASAL SINUS SURGERY  2004  . UTERINE FIBROID EMBOLIZATION      REVIEW OF SYSTEMS:  Constitutional: positive for fatigue Eyes: negative Ears, nose, mouth, throat, and face: negative Respiratory: negative Cardiovascular: negative Gastrointestinal: negative Genitourinary:negative Integument/breast: negative Hematologic/lymphatic: negative Musculoskeletal:positive for arthralgias Neurological: negative Behavioral/Psych: negative Endocrine: negative Allergic/Immunologic: negative   PHYSICAL EXAMINATION: General appearance: alert, cooperative and no distress Head: Normocephalic, without obvious abnormality, atraumatic Neck: no adenopathy Lymph nodes: Cervical, supraclavicular, and axillary nodes normal. Resp: clear to auscultation bilaterally Back: symmetric, no curvature. ROM normal. No CVA tenderness. Cardio: regular rate and rhythm, S1, S2 normal, no murmur, click, rub or gallop GI: soft, non-tender; bowel sounds normal; no masses,  no organomegaly Extremities: extremities normal, atraumatic, no cyanosis or edema Neurologic: Alert and oriented X 3, normal  strength and tone. Normal symmetric reflexes. Normal coordination and gait  ECOG PERFORMANCE STATUS: 0 - Asymptomatic  Blood pressure (!) 141/70, pulse 77, temperature 97.9 F (36.6 C), temperature source Oral, resp. rate 18, height 5' 7" (1.702 m), weight 264 lb 3.2 oz (119.8 kg), SpO2 100 %.  LABORATORY DATA: Lab Results  Component Value Date   WBC 7.7 09/13/2015   HGB 10.8 (L) 09/13/2015   HCT 33.7 (L) 09/13/2015   MCV 83.5 09/13/2015   PLT 194 09/13/2015      Chemistry      Component Value Date/Time   NA 138 09/13/2015 1141   K 4.5 09/13/2015 1141   CL 104 04/07/2012 1415   CO2 21 (L) 09/13/2015 1141   BUN 6.7 (L) 09/13/2015 1141   CREATININE 0.8 09/13/2015 1141      Component Value Date/Time   CALCIUM 9.5 09/13/2015 1141   ALKPHOS 96 09/13/2015 1141   AST 44 (H) 09/13/2015 1141   ALT 48  09/13/2015 1141   BILITOT 0.40 09/13/2015 1141     Other lab results: Beta-2 microglobulin 2.0, free kappa light chain 9.7, free lambda light chain 857.4 with a kappa/lambda ratio of 0.01. IgG 393, IgA 87 and IgM 688.  ASSESSMENT AND PLAN: This is a very pleasant 55 years old white female with a multiple myeloma initially diagnosed as monoclonal gammopathy in addition to peripheral neuropathy, and Endocrinopathy in the form of hypothyroidism and skin rash. The symptoms combined are suspicious for POEMS syndrome.  She completed treatment with subcutaneous tenderness Velcade and Decadron status post 31 cycles and tolerated her treatment fairly well. She has been on observation for the last 6 months. 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 She resumed her treatment with subcutaneous Velcade and Decadron status post 22 cycles. She is tolerating her treatment fairly well.  I recommended for the patient to continue her treatment as a scheduled but I will reduce the dose of Decadron  to 20 mg weekly with chemotherapy. I will see her back for follow-up visit in 3 weeks for evaluation and management of any adverse effect of her treatment. She was advised to call immediately if she has any concerning symptoms in the interval.  All questions were answered. The patient knows to call the clinic with any problems, questions or concerns. We can certainly see the patient much sooner if necessary.  Disclaimer: This note was dictated with voice recognition software. Similar sounding words can inadvertently be transcribed and may not be corrected upon review.

## 2015-09-17 DIAGNOSIS — M79605 Pain in left leg: Secondary | ICD-10-CM | POA: Diagnosis not present

## 2015-09-17 DIAGNOSIS — M542 Cervicalgia: Secondary | ICD-10-CM | POA: Diagnosis not present

## 2015-09-17 DIAGNOSIS — M4003 Postural kyphosis, cervicothoracic region: Secondary | ICD-10-CM | POA: Diagnosis not present

## 2015-09-20 ENCOUNTER — Other Ambulatory Visit (HOSPITAL_BASED_OUTPATIENT_CLINIC_OR_DEPARTMENT_OTHER): Payer: BLUE CROSS/BLUE SHIELD

## 2015-09-20 ENCOUNTER — Ambulatory Visit (HOSPITAL_BASED_OUTPATIENT_CLINIC_OR_DEPARTMENT_OTHER): Payer: BLUE CROSS/BLUE SHIELD

## 2015-09-20 DIAGNOSIS — Z5112 Encounter for antineoplastic immunotherapy: Secondary | ICD-10-CM | POA: Diagnosis not present

## 2015-09-20 DIAGNOSIS — C9000 Multiple myeloma not having achieved remission: Secondary | ICD-10-CM

## 2015-09-20 LAB — CBC WITH DIFFERENTIAL/PLATELET
BASO%: 0.6 % (ref 0.0–2.0)
BASOS ABS: 0 10*3/uL (ref 0.0–0.1)
EOS%: 1.4 % (ref 0.0–7.0)
Eosinophils Absolute: 0.1 10*3/uL (ref 0.0–0.5)
HEMATOCRIT: 32.1 % — AB (ref 34.8–46.6)
HGB: 10.5 g/dL — ABNORMAL LOW (ref 11.6–15.9)
LYMPH#: 1 10*3/uL (ref 0.9–3.3)
LYMPH%: 14.2 % (ref 14.0–49.7)
MCH: 27.3 pg (ref 25.1–34.0)
MCHC: 32.7 g/dL (ref 31.5–36.0)
MCV: 83.5 fL (ref 79.5–101.0)
MONO#: 0.4 10*3/uL (ref 0.1–0.9)
MONO%: 5.5 % (ref 0.0–14.0)
NEUT#: 5.6 10*3/uL (ref 1.5–6.5)
NEUT%: 78.3 % — AB (ref 38.4–76.8)
PLATELETS: 171 10*3/uL (ref 145–400)
RBC: 3.84 10*6/uL (ref 3.70–5.45)
RDW: 15.2 % — ABNORMAL HIGH (ref 11.2–14.5)
WBC: 7.2 10*3/uL (ref 3.9–10.3)

## 2015-09-20 LAB — COMPREHENSIVE METABOLIC PANEL
ALT: 47 U/L (ref 0–55)
ANION GAP: 11 meq/L (ref 3–11)
AST: 38 U/L — ABNORMAL HIGH (ref 5–34)
Albumin: 3.5 g/dL (ref 3.5–5.0)
Alkaline Phosphatase: 92 U/L (ref 40–150)
BUN: 10.8 mg/dL (ref 7.0–26.0)
CALCIUM: 9.5 mg/dL (ref 8.4–10.4)
CHLORIDE: 102 meq/L (ref 98–109)
CO2: 23 mEq/L (ref 22–29)
Creatinine: 0.8 mg/dL (ref 0.6–1.1)
EGFR: 83 mL/min/{1.73_m2} — AB (ref >90)
Glucose: 133 mg/dl (ref 70–140)
POTASSIUM: 4.2 meq/L (ref 3.5–5.1)
Sodium: 136 mEq/L (ref 136–145)
Total Bilirubin: 0.36 mg/dL (ref 0.20–1.20)
Total Protein: 7 g/dL (ref 6.4–8.3)

## 2015-09-20 MED ORDER — ONDANSETRON HCL 8 MG PO TABS
8.0000 mg | ORAL_TABLET | Freq: Once | ORAL | Status: AC
  Administered 2015-09-20: 8 mg via ORAL

## 2015-09-20 MED ORDER — ONDANSETRON HCL 8 MG PO TABS
ORAL_TABLET | ORAL | Status: AC
  Filled 2015-09-20: qty 1

## 2015-09-20 MED ORDER — BORTEZOMIB CHEMO SQ INJECTION 3.5 MG (2.5MG/ML)
1.3000 mg/m2 | Freq: Once | INTRAMUSCULAR | Status: AC
Start: 2015-09-20 — End: 2015-09-20
  Administered 2015-09-20: 3.25 mg via SUBCUTANEOUS
  Filled 2015-09-20: qty 3.25

## 2015-09-20 NOTE — Patient Instructions (Signed)
[facility] Discharge Instructions for Patients Receiving Chemotherapy  Today you received the following chemotherapy agents:  Velcade  To help prevent nausea and vomiting after your treatment, we encourage you to take your nausea medication as prescribed.   If you develop nausea and vomiting that is not controlled by your nausea medication, call the clinic.   BELOW ARE SYMPTOMS THAT SHOULD BE REPORTED IMMEDIATELY:  *FEVER GREATER THAN 100.5 F  *CHILLS WITH OR WITHOUT FEVER  NAUSEA AND VOMITING THAT IS NOT CONTROLLED WITH YOUR NAUSEA MEDICATION  *UNUSUAL SHORTNESS OF BREATH  *UNUSUAL BRUISING OR BLEEDING  TENDERNESS IN MOUTH AND THROAT WITH OR WITHOUT PRESENCE OF ULCERS  *URINARY PROBLEMS  *BOWEL PROBLEMS  UNUSUAL RASH Items with * indicate a potential emergency and should be followed up as soon as possible.  Feel free to call the clinic you have any questions or concerns. The clinic phone number is [phone].  Please show the CHEMO ALERT CARD at check-in to the Emergency Department and triage nurse.   

## 2015-09-22 ENCOUNTER — Encounter: Payer: Self-pay | Admitting: Internal Medicine

## 2015-09-27 ENCOUNTER — Ambulatory Visit (HOSPITAL_BASED_OUTPATIENT_CLINIC_OR_DEPARTMENT_OTHER): Payer: BLUE CROSS/BLUE SHIELD

## 2015-09-27 ENCOUNTER — Other Ambulatory Visit (HOSPITAL_BASED_OUTPATIENT_CLINIC_OR_DEPARTMENT_OTHER): Payer: BLUE CROSS/BLUE SHIELD

## 2015-09-27 VITALS — BP 154/77 | HR 70 | Temp 97.9°F | Resp 18

## 2015-09-27 DIAGNOSIS — Z5112 Encounter for antineoplastic immunotherapy: Secondary | ICD-10-CM | POA: Diagnosis not present

## 2015-09-27 DIAGNOSIS — C9000 Multiple myeloma not having achieved remission: Secondary | ICD-10-CM

## 2015-09-27 LAB — CBC WITH DIFFERENTIAL/PLATELET
BASO%: 0.6 % (ref 0.0–2.0)
Basophils Absolute: 0 10*3/uL (ref 0.0–0.1)
EOS ABS: 0.1 10*3/uL (ref 0.0–0.5)
EOS%: 2.2 % (ref 0.0–7.0)
HCT: 32.1 % — ABNORMAL LOW (ref 34.8–46.6)
HEMOGLOBIN: 10.3 g/dL — AB (ref 11.6–15.9)
LYMPH#: 0.7 10*3/uL — AB (ref 0.9–3.3)
LYMPH%: 11.6 % — ABNORMAL LOW (ref 14.0–49.7)
MCH: 27 pg (ref 25.1–34.0)
MCHC: 32.3 g/dL (ref 31.5–36.0)
MCV: 83.5 fL (ref 79.5–101.0)
MONO#: 0.3 10*3/uL (ref 0.1–0.9)
MONO%: 5.1 % (ref 0.0–14.0)
NEUT%: 80.5 % — ABNORMAL HIGH (ref 38.4–76.8)
NEUTROS ABS: 5.2 10*3/uL (ref 1.5–6.5)
Platelets: 179 10*3/uL (ref 145–400)
RBC: 3.84 10*6/uL (ref 3.70–5.45)
RDW: 15.7 % — AB (ref 11.2–14.5)
WBC: 6.5 10*3/uL (ref 3.9–10.3)

## 2015-09-27 LAB — COMPREHENSIVE METABOLIC PANEL
ALT: 57 U/L — AB (ref 0–55)
AST: 53 U/L — AB (ref 5–34)
Albumin: 3.5 g/dL (ref 3.5–5.0)
Alkaline Phosphatase: 101 U/L (ref 40–150)
Anion Gap: 10 mEq/L (ref 3–11)
BUN: 6.8 mg/dL — AB (ref 7.0–26.0)
CALCIUM: 9.6 mg/dL (ref 8.4–10.4)
CHLORIDE: 105 meq/L (ref 98–109)
CO2: 24 meq/L (ref 22–29)
CREATININE: 0.8 mg/dL (ref 0.6–1.1)
EGFR: 85 mL/min/{1.73_m2} — ABNORMAL LOW (ref >90)
Glucose: 145 mg/dl — ABNORMAL HIGH (ref 70–140)
Potassium: 4.2 mEq/L (ref 3.5–5.1)
Sodium: 139 mEq/L (ref 136–145)
TOTAL PROTEIN: 7.2 g/dL (ref 6.4–8.3)
Total Bilirubin: 0.46 mg/dL (ref 0.20–1.20)

## 2015-09-27 MED ORDER — BORTEZOMIB CHEMO SQ INJECTION 3.5 MG (2.5MG/ML)
1.3000 mg/m2 | Freq: Once | INTRAMUSCULAR | Status: AC
  Administered 2015-09-27: 3.25 mg via SUBCUTANEOUS
  Filled 2015-09-27: qty 3.25

## 2015-09-27 MED ORDER — ONDANSETRON HCL 8 MG PO TABS
ORAL_TABLET | ORAL | Status: AC
  Filled 2015-09-27: qty 1

## 2015-09-27 MED ORDER — ONDANSETRON HCL 8 MG PO TABS
8.0000 mg | ORAL_TABLET | Freq: Once | ORAL | Status: AC
  Administered 2015-09-27: 8 mg via ORAL

## 2015-09-27 NOTE — Patient Instructions (Signed)
[facility] Discharge Instructions for Patients Receiving Chemotherapy  Today you received the following chemotherapy agents:  Velcade  To help prevent nausea and vomiting after your treatment, we encourage you to take your nausea medication as prescribed.   If you develop nausea and vomiting that is not controlled by your nausea medication, call the clinic.   BELOW ARE SYMPTOMS THAT SHOULD BE REPORTED IMMEDIATELY:  *FEVER GREATER THAN 100.5 F  *CHILLS WITH OR WITHOUT FEVER  NAUSEA AND VOMITING THAT IS NOT CONTROLLED WITH YOUR NAUSEA MEDICATION  *UNUSUAL SHORTNESS OF BREATH  *UNUSUAL BRUISING OR BLEEDING  TENDERNESS IN MOUTH AND THROAT WITH OR WITHOUT PRESENCE OF ULCERS  *URINARY PROBLEMS  *BOWEL PROBLEMS  UNUSUAL RASH Items with * indicate a potential emergency and should be followed up as soon as possible.  Feel free to call the clinic you have any questions or concerns. The clinic phone number is [phone].  Please show the CHEMO ALERT CARD at check-in to the Emergency Department and triage nurse.   

## 2015-09-30 ENCOUNTER — Other Ambulatory Visit: Payer: Self-pay | Admitting: Medical Oncology

## 2015-09-30 ENCOUNTER — Telehealth: Payer: Self-pay | Admitting: Medical Oncology

## 2015-09-30 DIAGNOSIS — C9000 Multiple myeloma not having achieved remission: Secondary | ICD-10-CM

## 2015-09-30 MED ORDER — ACYCLOVIR 400 MG PO TABS: 400.0000 mg | ORAL_TABLET | Freq: Two times a day (BID) | ORAL | 0 refills | Status: DC

## 2015-09-30 NOTE — Telephone Encounter (Signed)
Does she need refill on acyclovir?

## 2015-09-30 NOTE — Telephone Encounter (Signed)
Left message to call about acyclovir

## 2015-10-01 DIAGNOSIS — M542 Cervicalgia: Secondary | ICD-10-CM | POA: Diagnosis not present

## 2015-10-01 DIAGNOSIS — M4003 Postural kyphosis, cervicothoracic region: Secondary | ICD-10-CM | POA: Diagnosis not present

## 2015-10-04 ENCOUNTER — Ambulatory Visit (HOSPITAL_BASED_OUTPATIENT_CLINIC_OR_DEPARTMENT_OTHER): Payer: BLUE CROSS/BLUE SHIELD

## 2015-10-04 ENCOUNTER — Ambulatory Visit (HOSPITAL_BASED_OUTPATIENT_CLINIC_OR_DEPARTMENT_OTHER): Payer: BLUE CROSS/BLUE SHIELD | Admitting: Internal Medicine

## 2015-10-04 ENCOUNTER — Other Ambulatory Visit (HOSPITAL_BASED_OUTPATIENT_CLINIC_OR_DEPARTMENT_OTHER): Payer: BLUE CROSS/BLUE SHIELD

## 2015-10-04 ENCOUNTER — Encounter: Payer: Self-pay | Admitting: Internal Medicine

## 2015-10-04 ENCOUNTER — Telehealth: Payer: Self-pay | Admitting: Internal Medicine

## 2015-10-04 VITALS — BP 149/67 | HR 72 | Temp 98.6°F | Resp 17 | Ht 67.0 in | Wt 265.7 lb

## 2015-10-04 DIAGNOSIS — Z5112 Encounter for antineoplastic immunotherapy: Secondary | ICD-10-CM

## 2015-10-04 DIAGNOSIS — C9000 Multiple myeloma not having achieved remission: Secondary | ICD-10-CM

## 2015-10-04 DIAGNOSIS — G609 Hereditary and idiopathic neuropathy, unspecified: Secondary | ICD-10-CM

## 2015-10-04 DIAGNOSIS — E039 Hypothyroidism, unspecified: Secondary | ICD-10-CM

## 2015-10-04 DIAGNOSIS — R21 Rash and other nonspecific skin eruption: Secondary | ICD-10-CM | POA: Diagnosis not present

## 2015-10-04 LAB — COMPREHENSIVE METABOLIC PANEL
ALBUMIN: 3.6 g/dL (ref 3.5–5.0)
ALK PHOS: 96 U/L (ref 40–150)
ALT: 54 U/L (ref 0–55)
AST: 47 U/L — ABNORMAL HIGH (ref 5–34)
Anion Gap: 11 mEq/L (ref 3–11)
BILIRUBIN TOTAL: 0.45 mg/dL (ref 0.20–1.20)
BUN: 8.8 mg/dL (ref 7.0–26.0)
CALCIUM: 9.6 mg/dL (ref 8.4–10.4)
CO2: 23 mEq/L (ref 22–29)
Chloride: 103 mEq/L (ref 98–109)
Creatinine: 0.7 mg/dL (ref 0.6–1.1)
GLUCOSE: 132 mg/dL (ref 70–140)
POTASSIUM: 4.3 meq/L (ref 3.5–5.1)
Sodium: 137 mEq/L (ref 136–145)
TOTAL PROTEIN: 6.9 g/dL (ref 6.4–8.3)

## 2015-10-04 LAB — CBC WITH DIFFERENTIAL/PLATELET
BASO%: 0.9 % (ref 0.0–2.0)
BASOS ABS: 0.1 10*3/uL (ref 0.0–0.1)
EOS ABS: 0.1 10*3/uL (ref 0.0–0.5)
EOS%: 1.7 % (ref 0.0–7.0)
HEMATOCRIT: 32.1 % — AB (ref 34.8–46.6)
HEMOGLOBIN: 10.4 g/dL — AB (ref 11.6–15.9)
LYMPH#: 1 10*3/uL (ref 0.9–3.3)
LYMPH%: 17 % (ref 14.0–49.7)
MCH: 27.1 pg (ref 25.1–34.0)
MCHC: 32.4 g/dL (ref 31.5–36.0)
MCV: 83.8 fL (ref 79.5–101.0)
MONO#: 0.4 10*3/uL (ref 0.1–0.9)
MONO%: 7.1 % (ref 0.0–14.0)
NEUT%: 73.3 % (ref 38.4–76.8)
NEUTROS ABS: 4.4 10*3/uL (ref 1.5–6.5)
Platelets: 174 10*3/uL (ref 145–400)
RBC: 3.83 10*6/uL (ref 3.70–5.45)
RDW: 15.7 % — AB (ref 11.2–14.5)
WBC: 6 10*3/uL (ref 3.9–10.3)

## 2015-10-04 MED ORDER — ONDANSETRON HCL 8 MG PO TABS
8.0000 mg | ORAL_TABLET | Freq: Once | ORAL | Status: AC
  Administered 2015-10-04: 8 mg via ORAL

## 2015-10-04 MED ORDER — ONDANSETRON HCL 8 MG PO TABS
ORAL_TABLET | ORAL | Status: AC
  Filled 2015-10-04: qty 1

## 2015-10-04 MED ORDER — BORTEZOMIB CHEMO SQ INJECTION 3.5 MG (2.5MG/ML)
1.3000 mg/m2 | Freq: Once | INTRAMUSCULAR | Status: AC
  Administered 2015-10-04: 3.25 mg via SUBCUTANEOUS
  Filled 2015-10-04: qty 3.25

## 2015-10-04 NOTE — Patient Instructions (Signed)
[facility] Discharge Instructions for Patients Receiving Chemotherapy  Today you received the following chemotherapy agents Velcade  To help prevent nausea and vomiting after your treatment, we encourage you to take your nausea medication    If you develop nausea and vomiting that is not controlled by your nausea medication, call the clinic.   BELOW ARE SYMPTOMS THAT SHOULD BE REPORTED IMMEDIATELY:  *FEVER GREATER THAN 100.5 F  *CHILLS WITH OR WITHOUT FEVER  NAUSEA AND VOMITING THAT IS NOT CONTROLLED WITH YOUR NAUSEA MEDICATION  *UNUSUAL SHORTNESS OF BREATH  *UNUSUAL BRUISING OR BLEEDING  TENDERNESS IN MOUTH AND THROAT WITH OR WITHOUT PRESENCE OF ULCERS  *URINARY PROBLEMS  *BOWEL PROBLEMS  UNUSUAL RASH Items with * indicate a potential emergency and should be followed up as soon as possible.  Feel free to call the clinic you have any questions or concerns. The clinic phone number is [phone].  Please show the CHEMO ALERT CARD at check-in to the Emergency Department and triage nurse.   

## 2015-10-04 NOTE — Telephone Encounter (Signed)
GAVE PATIENT AVS REPORT AND APPOINTMENTS FOR October THRU December. PER LOS NO APPOINTMENT 10/17 AS PATIENT WILL BE OUT OF TOWN.

## 2015-10-04 NOTE — Progress Notes (Signed)
[place] [phone]   [phone]  OFFICE PROGRESS NOTE  [name], MD [place] [address]  DIAGNOSIS: MGUS diagnosed in September 2010, with additional symptoms suggestive of POEMS syndrome.   PRIOR THERAPY: Velcade 1.3 MG/M2 subcutaneously with Decadron 40 mg by mouth on a weekly basis. First cycle 11/24/2013. She status post 31 weekly doses of treatment.   CURRENT THERAPY: The patient will resume treatment again with Velcade 1.3 MG/M2 subcutaneously and weekly basis with Decadron 40 mg by mouth weekly. First dose 02/01/2015. Status post 25 cycles.   INTERVAL HISTORY: Brenda Conner 55 y.o. female returns to the clinic today for followup visit. The patient is feeling fine today with no specific complaints except for mild fatigue. She is tolerating her treatment well with no specific complaints. She continues to have mild peripheral neuropathy but not increased since starting the treatment. She denied having any significant weight loss or night sweats. The patient denied having any fever or chills. She has no chest pain, shortness of breath, cough or hemoptysis. She is here today for evaluation and repeat blood work.  MEDICAL HISTORY: Past Medical History:  Diagnosis Date  . Acid reflux   . Anxiety   . Asthma   . Cancer [facility])    waldenstroms/ macroglobinulemia  . Depression   . Depression   . Diabetes mellitus without complication ([place])   . Hypercholesteremia   . Hypertension   . Hypothyroidism   . Macroglobulinemia ([place])    ? POEMS syndrome  . Multiple myeloma ([place])   . Obesity   . PONV (postoperative nausea and vomiting)   . Sleep apnea    CPAP at bedtime  . Sleep apnea     ALLERGIES:  is allergic to codeine; hydrocodone; lortab [hydrocodone-acetaminophen]; onion; shellfish allergy; and amoxicillin.  MEDICATIONS:  Current Outpatient Prescriptions  Medication Sig Dispense Refill  . acyclovir  (ZOVIRAX) 400 MG tablet Take 1 tablet (400 mg total) by mouth 2 (two) times daily. 180 tablet 0  . ALPRAZolam (XANAX) 0.5 MG tablet Take 1 tablet (0.5 mg total) by mouth at bedtime as needed for anxiety. 30 tablet 0  . aspirin 81 MG tablet Take 81 mg by mouth daily.    . Azelastine HCl 0.15 % SOLN as needed.    . B-D UF III MINI PEN NEEDLES 31G X 5 MM MISC     . Blood Glucose Monitoring Suppl (ONE TOUCH ULTRA MINI) W/DEVICE KIT See admin instructions. Reported on 01/25/2015  0  . bortezomib IV (VELCADE) 3.5 MG injection Inject into the vein once.    . Cetirizine HCl (ZYRTEC ALLERGY PO) Take by mouth daily.    [name] dexamethasone (DECADRON) 4 MG tablet 5 tablet by mouth every week. Start with the first dose of chemotherapy. 40 tablet 3  . DiphenhydrAMINE HCl (BENADRYL ALLERGY PO) Take by mouth as needed.    [name] EPIPEN 2-PAK 0.3 MG/0.3ML SOAJ injection Reported on 06/21/2015  1  . Fexofenadine HCl (ALLEGRA ALLERGY PO) Take by mouth daily.    . Fluticasone-Salmeterol (ADVAIR) 100-50 MCG/DOSE AEPB Inhale 2 puffs into the lungs every 12 (twelve) hours.    [name] ibuprofen (ADVIL,MOTRIN) 100 MG tablet Take 100 mg by mouth every 6 (six) hours as needed. Reported on 05/31/2015    . insulin glargine (LANTUS) 100 UNIT/ML injection Inject 8 Units into the skin 2 (two) times a week. Reported on 02/18/2015    . levocetirizine (XYZAL) 5 MG tablet  Take 5 mg by mouth every evening.    [name] levothyroxine (SYNTHROID, LEVOTHROID) 150 MCG tablet Take 150 mcg by mouth daily before breakfast.    . lisinopril (PRINIVIL,ZESTRIL) 10 MG tablet Take 10 mg by mouth daily.  3  . metFORMIN (GLUCOPHAGE) 1000 MG tablet Take 1,000 mg by mouth 2 (two) times daily.  3  . NOVOLOG FLEXPEN 100 UNIT/ML FlexPen 3 Units daily as needed. Reported on 01/25/2015  6  . omeprazole (PRILOSEC) 40 MG capsule Take 40 mg by mouth 2 (two) times daily.     . ondansetron (ZOFRAN-ODT) 8 MG disintegrating tablet Take 8 mg by mouth every 8 (eight) hours as needed.  Reported on 05/31/2015  2  . ONE TOUCH ULTRA TEST test strip See admin instructions. Reported on 01/25/2015  11  . pravastatin (PRAVACHOL) 40 MG tablet Take 40 mg by mouth every evening.     [name] PROAIR HFA 108 (90 Base) MCG/ACT inhaler Reported on 06/21/2015  1  . sertraline (ZOLOFT) 50 MG tablet Take 3 tablets (150 mg total) by mouth daily. 270 tablet 4  . verapamil (CALAN-SR) 240 MG CR tablet Take 240 mg by mouth 2 (two) times daily.     No current facility-administered medications for this visit.     SURGICAL HISTORY:  Past Surgical History:  Procedure Laterality Date  . ABLATION  09/2007   HTA and polyp resection  . BACK SURGERY  03/2004   herniation, L4-L5  . Bil Laprascopic knee surgery    . BONE MARROW BIOPSY     2011  . BONE MARROW BIOPSY  4/14  . CHOLECYSTECTOMY    . FOOT SURGERY  1998  . KNEE ARTHROSCOPY W/ MENISCAL REPAIR  10/10 ; 3/11  . LAPAROSCOPIC CHOLECYSTECTOMY  1997  . NASAL SINUS SURGERY  2004  . UTERINE FIBROID EMBOLIZATION      REVIEW OF SYSTEMS:  A comprehensive review of systems was negative except for: Neurological: positive for paresthesia   PHYSICAL EXAMINATION: General appearance: alert, cooperative and no distress Head: Normocephalic, without obvious abnormality, atraumatic Neck: no adenopathy Lymph nodes: Cervical, supraclavicular, and axillary nodes normal. Resp: clear to auscultation bilaterally Back: symmetric, no curvature. ROM normal. No CVA tenderness. Cardio: regular rate and rhythm, S1, S2 normal, no murmur, click, rub or gallop GI: soft, non-tender; bowel sounds normal; no masses,  no organomegaly Extremities: extremities normal, atraumatic, no cyanosis or edema Neurologic: Alert and oriented X 3, normal strength and tone. Normal symmetric reflexes. Normal coordination and gait  ECOG PERFORMANCE STATUS: 0 - Asymptomatic  Blood pressure (!) 149/67, pulse 72, temperature 98.6 F (37 C), temperature source Oral, resp. rate 17, height 5' 7"   (1.702 m), weight 265 lb 11.2 oz (120.5 kg), SpO2 99 %.  LABORATORY DATA: Lab Results  Component Value Date   WBC 6.0 10/04/2015   HGB 10.4 (L) 10/04/2015   HCT 32.1 (L) 10/04/2015   MCV 83.8 10/04/2015   PLT 174 10/04/2015      Chemistry      Component Value Date/Time   NA 137 10/04/2015 0936   K 4.3 10/04/2015 0936   CL 104 04/07/2012 1415   CO2 23 10/04/2015 0936   BUN 8.8 10/04/2015 0936   CREATININE 0.7 10/04/2015 0936      Component Value Date/Time   CALCIUM 9.6 10/04/2015 0936   ALKPHOS 96 10/04/2015 0936   AST 47 (H) 10/04/2015 0936   ALT 54 10/04/2015 0936   BILITOT 0.45 10/04/2015 0936     Other  lab results: Beta-2 microglobulin 2.0, free kappa light chain 9.7, free lambda light chain 857.4 with a kappa/lambda ratio of 0.01. IgG 393, IgA 87 and IgM 688.  ASSESSMENT AND PLAN: This is a very pleasant 55 years old white female with a multiple myeloma initially diagnosed as monoclonal gammopathy in addition to peripheral neuropathy, and Endocrinopathy in the form of hypothyroidism and skin rash. The symptoms combined are suspicious for POEMS syndrome.  She completed treatment with subcutaneous tenderness Velcade and Decadron status post 31 cycles and tolerated her treatment fairly well. She has been on observation for the last 6 months. 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 She resumed her treatment with subcutaneous Velcade and Decadron status post 25 cycles. She is tolerating her treatment fairly well.  I recommended for the patient to continue her treatment as a scheduled with reduced dose of Decadron to 20 mg weekly with chemotherapy. I will see her back for follow-up visit in one month for evaluation and management of any adverse effect of her treatment after repeating myeloma panel. She will skip her treatment on October 17 because [place]. She was advised to call immediately if she has any concerning symptoms in the interval.  All questions were answered. The patient knows to call the clinic with any problems, questions or concerns. We can certainly see the patient much sooner if necessary.  Disclaimer: This note was dictated with voice recognition software. Similar sounding words can inadvertently be transcribed and may not be corrected upon review.

## 2015-10-08 DIAGNOSIS — M4003 Postural kyphosis, cervicothoracic region: Secondary | ICD-10-CM | POA: Diagnosis not present

## 2015-10-08 DIAGNOSIS — M79604 Pain in right leg: Secondary | ICD-10-CM | POA: Diagnosis not present

## 2015-10-08 DIAGNOSIS — M542 Cervicalgia: Secondary | ICD-10-CM | POA: Diagnosis not present

## 2015-10-11 ENCOUNTER — Other Ambulatory Visit (HOSPITAL_BASED_OUTPATIENT_CLINIC_OR_DEPARTMENT_OTHER): Payer: BLUE CROSS/BLUE SHIELD

## 2015-10-11 ENCOUNTER — Ambulatory Visit (HOSPITAL_BASED_OUTPATIENT_CLINIC_OR_DEPARTMENT_OTHER): Payer: BLUE CROSS/BLUE SHIELD

## 2015-10-11 VITALS — BP 126/64 | HR 72 | Temp 97.8°F | Resp 18

## 2015-10-11 DIAGNOSIS — Z5111 Encounter for antineoplastic chemotherapy: Secondary | ICD-10-CM | POA: Diagnosis not present

## 2015-10-11 DIAGNOSIS — C9000 Multiple myeloma not having achieved remission: Secondary | ICD-10-CM

## 2015-10-11 LAB — CBC WITH DIFFERENTIAL/PLATELET
BASO%: 0.1 % (ref 0.0–2.0)
BASOS ABS: 0 10*3/uL (ref 0.0–0.1)
EOS ABS: 0.1 10*3/uL (ref 0.0–0.5)
EOS%: 1.3 % (ref 0.0–7.0)
HEMATOCRIT: 32.3 % — AB (ref 34.8–46.6)
HEMOGLOBIN: 10.3 g/dL — AB (ref 11.6–15.9)
LYMPH#: 0.6 10*3/uL — AB (ref 0.9–3.3)
LYMPH%: 9 % — ABNORMAL LOW (ref 14.0–49.7)
MCH: 27 pg (ref 25.1–34.0)
MCHC: 31.9 g/dL (ref 31.5–36.0)
MCV: 84.8 fL (ref 79.5–101.0)
MONO#: 0.2 10*3/uL (ref 0.1–0.9)
MONO%: 3 % (ref 0.0–14.0)
NEUT%: 86.6 % — AB (ref 38.4–76.8)
NEUTROS ABS: 6.2 10*3/uL (ref 1.5–6.5)
Platelets: 172 10*3/uL (ref 145–400)
RBC: 3.81 10*6/uL (ref 3.70–5.45)
RDW: 15.2 % — ABNORMAL HIGH (ref 11.2–14.5)
WBC: 7.1 10*3/uL (ref 3.9–10.3)

## 2015-10-11 LAB — COMPREHENSIVE METABOLIC PANEL
ALBUMIN: 3.6 g/dL (ref 3.5–5.0)
ALK PHOS: 102 U/L (ref 40–150)
ALT: 52 U/L (ref 0–55)
AST: 53 U/L — ABNORMAL HIGH (ref 5–34)
Anion Gap: 12 mEq/L — ABNORMAL HIGH (ref 3–11)
BILIRUBIN TOTAL: 0.45 mg/dL (ref 0.20–1.20)
BUN: 7.6 mg/dL (ref 7.0–26.0)
CALCIUM: 9.5 mg/dL (ref 8.4–10.4)
CO2: 19 mEq/L — ABNORMAL LOW (ref 22–29)
CREATININE: 0.8 mg/dL (ref 0.6–1.1)
Chloride: 106 mEq/L (ref 98–109)
EGFR: 88 mL/min/{1.73_m2} — ABNORMAL LOW (ref >90)
Glucose: 144 mg/dl — ABNORMAL HIGH (ref 70–140)
Potassium: 4.7 mEq/L (ref 3.5–5.1)
Sodium: 137 mEq/L (ref 136–145)
TOTAL PROTEIN: 6.9 g/dL (ref 6.4–8.3)

## 2015-10-11 MED ORDER — ONDANSETRON HCL 8 MG PO TABS
ORAL_TABLET | ORAL | Status: AC
  Filled 2015-10-11: qty 1

## 2015-10-11 MED ORDER — ONDANSETRON HCL 8 MG PO TABS
8.0000 mg | ORAL_TABLET | Freq: Once | ORAL | Status: AC
  Administered 2015-10-11: 8 mg via ORAL

## 2015-10-11 MED ORDER — BORTEZOMIB CHEMO SQ INJECTION 3.5 MG (2.5MG/ML)
1.3000 mg/m2 | Freq: Once | INTRAMUSCULAR | Status: AC
  Administered 2015-10-11: 3.25 mg via SUBCUTANEOUS
  Filled 2015-10-11: qty 3.25

## 2015-10-11 NOTE — Patient Instructions (Signed)
[facility] Discharge Instructions for Patients Receiving Chemotherapy  Today you received the following chemotherapy agents Velcade  To help prevent nausea and vomiting after your treatment, we encourage you to take your nausea medication    If you develop nausea and vomiting that is not controlled by your nausea medication, call the clinic.   BELOW ARE SYMPTOMS THAT SHOULD BE REPORTED IMMEDIATELY:  *FEVER GREATER THAN 100.5 F  *CHILLS WITH OR WITHOUT FEVER  NAUSEA AND VOMITING THAT IS NOT CONTROLLED WITH YOUR NAUSEA MEDICATION  *UNUSUAL SHORTNESS OF BREATH  *UNUSUAL BRUISING OR BLEEDING  TENDERNESS IN MOUTH AND THROAT WITH OR WITHOUT PRESENCE OF ULCERS  *URINARY PROBLEMS  *BOWEL PROBLEMS  UNUSUAL RASH Items with * indicate a potential emergency and should be followed up as soon as possible.  Feel free to call the clinic you have any questions or concerns. The clinic phone number is [phone].  Please show the CHEMO ALERT CARD at check-in to the Emergency Department and triage nurse.   

## 2015-10-18 ENCOUNTER — Ambulatory Visit (HOSPITAL_BASED_OUTPATIENT_CLINIC_OR_DEPARTMENT_OTHER): Payer: BLUE CROSS/BLUE SHIELD

## 2015-10-18 ENCOUNTER — Other Ambulatory Visit (HOSPITAL_BASED_OUTPATIENT_CLINIC_OR_DEPARTMENT_OTHER): Payer: BLUE CROSS/BLUE SHIELD

## 2015-10-18 VITALS — BP 142/61 | HR 75 | Temp 98.1°F | Resp 18

## 2015-10-18 DIAGNOSIS — Z5112 Encounter for antineoplastic immunotherapy: Secondary | ICD-10-CM | POA: Diagnosis not present

## 2015-10-18 DIAGNOSIS — C9000 Multiple myeloma not having achieved remission: Secondary | ICD-10-CM

## 2015-10-18 LAB — CBC WITH DIFFERENTIAL/PLATELET
BASO%: 0.5 % (ref 0.0–2.0)
Basophils Absolute: 0 10*3/uL (ref 0.0–0.1)
EOS%: 1 % (ref 0.0–7.0)
Eosinophils Absolute: 0.1 10*3/uL (ref 0.0–0.5)
HEMATOCRIT: 31.2 % — AB (ref 34.8–46.6)
HEMOGLOBIN: 10.1 g/dL — AB (ref 11.6–15.9)
LYMPH%: 7 % — AB (ref 14.0–49.7)
MCH: 26.9 pg (ref 25.1–34.0)
MCHC: 32.3 g/dL (ref 31.5–36.0)
MCV: 83.2 fL (ref 79.5–101.0)
MONO#: 0.2 10*3/uL (ref 0.1–0.9)
MONO%: 3 % (ref 0.0–14.0)
NEUT#: 5.6 10*3/uL (ref 1.5–6.5)
NEUT%: 88.5 % — ABNORMAL HIGH (ref 38.4–76.8)
PLATELETS: 164 10*3/uL (ref 145–400)
RBC: 3.75 10*6/uL (ref 3.70–5.45)
RDW: 16 % — ABNORMAL HIGH (ref 11.2–14.5)
WBC: 6.3 10*3/uL (ref 3.9–10.3)
lymph#: 0.4 10*3/uL — ABNORMAL LOW (ref 0.9–3.3)

## 2015-10-18 LAB — COMPREHENSIVE METABOLIC PANEL
ALBUMIN: 3.6 g/dL (ref 3.5–5.0)
ALK PHOS: 90 U/L (ref 40–150)
ALT: 41 U/L (ref 0–55)
ANION GAP: 10 meq/L (ref 3–11)
AST: 40 U/L — AB (ref 5–34)
BILIRUBIN TOTAL: 0.44 mg/dL (ref 0.20–1.20)
BUN: 8.5 mg/dL (ref 7.0–26.0)
CALCIUM: 9.4 mg/dL (ref 8.4–10.4)
CO2: 22 mEq/L (ref 22–29)
CREATININE: 0.8 mg/dL (ref 0.6–1.1)
Chloride: 105 mEq/L (ref 98–109)
EGFR: 83 mL/min/{1.73_m2} — ABNORMAL LOW (ref >90)
Glucose: 166 mg/dl — ABNORMAL HIGH (ref 70–140)
Potassium: 4.3 mEq/L (ref 3.5–5.1)
Sodium: 138 mEq/L (ref 136–145)
Total Protein: 6.9 g/dL (ref 6.4–8.3)

## 2015-10-18 MED ORDER — BORTEZOMIB CHEMO SQ INJECTION 3.5 MG (2.5MG/ML)
1.3000 mg/m2 | Freq: Once | INTRAMUSCULAR | Status: AC
  Administered 2015-10-18: 3.25 mg via SUBCUTANEOUS
  Filled 2015-10-18: qty 3.25

## 2015-10-18 MED ORDER — ONDANSETRON HCL 8 MG PO TABS
8.0000 mg | ORAL_TABLET | Freq: Once | ORAL | Status: AC
  Administered 2015-10-18: 8 mg via ORAL

## 2015-10-18 MED ORDER — ONDANSETRON HCL 8 MG PO TABS
ORAL_TABLET | ORAL | Status: AC
  Filled 2015-10-18: qty 1

## 2015-10-18 NOTE — Patient Instructions (Signed)
[facility] Discharge Instructions for Patients Receiving Chemotherapy  Today you received the following chemotherapy agents Velcade. To help prevent nausea and vomiting after your treatment, we encourage you to take your nausea medication as directed.  If you develop nausea and vomiting that is not controlled by your nausea medication, call the clinic.   BELOW ARE SYMPTOMS THAT SHOULD BE REPORTED IMMEDIATELY:  *FEVER GREATER THAN 100.5 F  *CHILLS WITH OR WITHOUT FEVER  NAUSEA AND VOMITING THAT IS NOT CONTROLLED WITH YOUR NAUSEA MEDICATION  *UNUSUAL SHORTNESS OF BREATH  *UNUSUAL BRUISING OR BLEEDING  TENDERNESS IN MOUTH AND THROAT WITH OR WITHOUT PRESENCE OF ULCERS  *URINARY PROBLEMS  *BOWEL PROBLEMS  UNUSUAL RASH Items with * indicate a potential emergency and should be followed up as soon as possible.  Feel free to call the clinic you have any questions or concerns. The clinic phone number is [phone].  Please show the CHEMO ALERT CARD at check-in to the Emergency Department and triage nurse.    

## 2015-10-25 ENCOUNTER — Other Ambulatory Visit: Payer: BLUE CROSS/BLUE SHIELD

## 2015-11-01 ENCOUNTER — Other Ambulatory Visit (HOSPITAL_BASED_OUTPATIENT_CLINIC_OR_DEPARTMENT_OTHER): Payer: BLUE CROSS/BLUE SHIELD

## 2015-11-01 ENCOUNTER — Ambulatory Visit (HOSPITAL_BASED_OUTPATIENT_CLINIC_OR_DEPARTMENT_OTHER): Payer: BLUE CROSS/BLUE SHIELD

## 2015-11-01 VITALS — BP 130/53 | HR 79 | Temp 98.3°F | Resp 18

## 2015-11-01 DIAGNOSIS — C9000 Multiple myeloma not having achieved remission: Secondary | ICD-10-CM | POA: Diagnosis not present

## 2015-11-01 DIAGNOSIS — Z5112 Encounter for antineoplastic immunotherapy: Secondary | ICD-10-CM | POA: Diagnosis not present

## 2015-11-01 LAB — CBC WITH DIFFERENTIAL/PLATELET
BASO%: 0.5 % (ref 0.0–2.0)
BASOS ABS: 0 10*3/uL (ref 0.0–0.1)
EOS%: 1.1 % (ref 0.0–7.0)
Eosinophils Absolute: 0.1 10*3/uL (ref 0.0–0.5)
HEMATOCRIT: 31.7 % — AB (ref 34.8–46.6)
HEMOGLOBIN: 10.3 g/dL — AB (ref 11.6–15.9)
LYMPH#: 0.5 10*3/uL — AB (ref 0.9–3.3)
LYMPH%: 8.9 % — ABNORMAL LOW (ref 14.0–49.7)
MCH: 26.8 pg (ref 25.1–34.0)
MCHC: 32.3 g/dL (ref 31.5–36.0)
MCV: 82.9 fL (ref 79.5–101.0)
MONO#: 0.1 10*3/uL (ref 0.1–0.9)
MONO%: 2.1 % (ref 0.0–14.0)
NEUT%: 87.4 % — ABNORMAL HIGH (ref 38.4–76.8)
NEUTROS ABS: 4.6 10*3/uL (ref 1.5–6.5)
Platelets: 189 10*3/uL (ref 145–400)
RBC: 3.83 10*6/uL (ref 3.70–5.45)
RDW: 16.1 % — AB (ref 11.2–14.5)
WBC: 5.3 10*3/uL (ref 3.9–10.3)

## 2015-11-01 LAB — COMPREHENSIVE METABOLIC PANEL
ALBUMIN: 3.7 g/dL (ref 3.5–5.0)
ALK PHOS: 103 U/L (ref 40–150)
ALT: 50 U/L (ref 0–55)
AST: 51 U/L — AB (ref 5–34)
Anion Gap: 12 mEq/L — ABNORMAL HIGH (ref 3–11)
BILIRUBIN TOTAL: 0.41 mg/dL (ref 0.20–1.20)
BUN: 9.2 mg/dL (ref 7.0–26.0)
CALCIUM: 9.6 mg/dL (ref 8.4–10.4)
CO2: 21 mEq/L — ABNORMAL LOW (ref 22–29)
CREATININE: 0.7 mg/dL (ref 0.6–1.1)
Chloride: 106 mEq/L (ref 98–109)
EGFR: >90 mL/min/{1.73_m2} (ref >90)
GLUCOSE: 155 mg/dL — AB (ref 70–140)
POTASSIUM: 4.5 meq/L (ref 3.5–5.1)
Sodium: 138 mEq/L (ref 136–145)
TOTAL PROTEIN: 7.2 g/dL (ref 6.4–8.3)

## 2015-11-01 LAB — LACTATE DEHYDROGENASE: LDH: 176 U/L (ref 125–245)

## 2015-11-01 MED ORDER — ONDANSETRON HCL 8 MG PO TABS
ORAL_TABLET | ORAL | Status: AC
  Filled 2015-11-01: qty 1

## 2015-11-01 MED ORDER — BORTEZOMIB CHEMO SQ INJECTION 3.5 MG (2.5MG/ML)
1.3000 mg/m2 | Freq: Once | INTRAMUSCULAR | Status: AC
  Administered 2015-11-01: 3.25 mg via SUBCUTANEOUS
  Filled 2015-11-01: qty 3.25

## 2015-11-01 MED ORDER — ONDANSETRON HCL 8 MG PO TABS
8.0000 mg | ORAL_TABLET | Freq: Once | ORAL | Status: AC
  Administered 2015-11-01: 8 mg via ORAL

## 2015-11-01 NOTE — Patient Instructions (Signed)
[facility] Discharge Instructions for Patients Receiving Chemotherapy  Today you received the following chemotherapy agents Velcade. To help prevent nausea and vomiting after your treatment, we encourage you to take your nausea medication as directed.  If you develop nausea and vomiting that is not controlled by your nausea medication, call the clinic.   BELOW ARE SYMPTOMS THAT SHOULD BE REPORTED IMMEDIATELY:  *FEVER GREATER THAN 100.5 F  *CHILLS WITH OR WITHOUT FEVER  NAUSEA AND VOMITING THAT IS NOT CONTROLLED WITH YOUR NAUSEA MEDICATION  *UNUSUAL SHORTNESS OF BREATH  *UNUSUAL BRUISING OR BLEEDING  TENDERNESS IN MOUTH AND THROAT WITH OR WITHOUT PRESENCE OF ULCERS  *URINARY PROBLEMS  *BOWEL PROBLEMS  UNUSUAL RASH Items with * indicate a potential emergency and should be followed up as soon as possible.  Feel free to call the clinic you have any questions or concerns. The clinic phone number is [phone].  Please show the CHEMO ALERT CARD at check-in to the Emergency Department and triage nurse.    

## 2015-11-02 LAB — IGG, IGA, IGM
IGM (IMMUNOGLOBIN M), SRM: 658 mg/dL — AB (ref 26–217)
IgA, Qn, Serum: 79 mg/dL — ABNORMAL LOW (ref 87–352)
IgG, Qn, Serum: 356 mg/dL — ABNORMAL LOW (ref 700–1600)

## 2015-11-02 LAB — BETA 2 MICROGLOBULIN, SERUM: Beta-2: 1.8 mg/L (ref 0.6–2.4)

## 2015-11-02 LAB — KAPPA/LAMBDA LIGHT CHAINS
IG KAPPA FREE LIGHT CHAIN: 9.1 mg/L (ref 3.3–19.4)
KAPPA/LAMBDA FLC RATIO: 0.01 — AB (ref 0.26–1.65)

## 2015-11-05 DIAGNOSIS — M79605 Pain in left leg: Secondary | ICD-10-CM | POA: Diagnosis not present

## 2015-11-05 DIAGNOSIS — M4003 Postural kyphosis, cervicothoracic region: Secondary | ICD-10-CM | POA: Diagnosis not present

## 2015-11-05 DIAGNOSIS — M542 Cervicalgia: Secondary | ICD-10-CM | POA: Diagnosis not present

## 2015-11-08 ENCOUNTER — Other Ambulatory Visit: Payer: BLUE CROSS/BLUE SHIELD

## 2015-11-08 ENCOUNTER — Encounter: Payer: Self-pay | Admitting: Internal Medicine

## 2015-11-08 ENCOUNTER — Telehealth: Payer: Self-pay | Admitting: Medical Oncology

## 2015-11-08 ENCOUNTER — Ambulatory Visit (HOSPITAL_BASED_OUTPATIENT_CLINIC_OR_DEPARTMENT_OTHER): Payer: BLUE CROSS/BLUE SHIELD | Admitting: Internal Medicine

## 2015-11-08 ENCOUNTER — Telehealth: Payer: Self-pay | Admitting: Internal Medicine

## 2015-11-08 ENCOUNTER — Other Ambulatory Visit (HOSPITAL_BASED_OUTPATIENT_CLINIC_OR_DEPARTMENT_OTHER): Payer: BLUE CROSS/BLUE SHIELD

## 2015-11-08 ENCOUNTER — Ambulatory Visit: Payer: BLUE CROSS/BLUE SHIELD

## 2015-11-08 VITALS — BP 129/51 | HR 68 | Temp 98.4°F | Resp 16 | Ht 67.0 in | Wt 266.4 lb

## 2015-11-08 DIAGNOSIS — C9000 Multiple myeloma not having achieved remission: Secondary | ICD-10-CM

## 2015-11-08 DIAGNOSIS — Z5111 Encounter for antineoplastic chemotherapy: Secondary | ICD-10-CM

## 2015-11-08 DIAGNOSIS — R21 Rash and other nonspecific skin eruption: Secondary | ICD-10-CM | POA: Diagnosis not present

## 2015-11-08 DIAGNOSIS — G609 Hereditary and idiopathic neuropathy, unspecified: Secondary | ICD-10-CM

## 2015-11-08 DIAGNOSIS — E039 Hypothyroidism, unspecified: Secondary | ICD-10-CM | POA: Diagnosis not present

## 2015-11-08 LAB — COMPREHENSIVE METABOLIC PANEL
ALT: 40 U/L (ref 0–55)
AST: 38 U/L — AB (ref 5–34)
Albumin: 3.8 g/dL (ref 3.5–5.0)
Alkaline Phosphatase: 99 U/L (ref 40–150)
Anion Gap: 13 mEq/L — ABNORMAL HIGH (ref 3–11)
BUN: 9 mg/dL (ref 7.0–26.0)
CALCIUM: 9.8 mg/dL (ref 8.4–10.4)
CHLORIDE: 104 meq/L (ref 98–109)
CO2: 22 meq/L (ref 22–29)
CREATININE: 0.7 mg/dL (ref 0.6–1.1)
EGFR: >90 mL/min/{1.73_m2} (ref >90)
GLUCOSE: 136 mg/dL (ref 70–140)
POTASSIUM: 4.2 meq/L (ref 3.5–5.1)
SODIUM: 139 meq/L (ref 136–145)
Total Bilirubin: 0.47 mg/dL (ref 0.20–1.20)
Total Protein: 7.1 g/dL (ref 6.4–8.3)

## 2015-11-08 LAB — CBC WITH DIFFERENTIAL/PLATELET
BASO%: 0.2 % (ref 0.0–2.0)
Basophils Absolute: 0 10*3/uL (ref 0.0–0.1)
EOS%: 0.7 % (ref 0.0–7.0)
Eosinophils Absolute: 0 10*3/uL (ref 0.0–0.5)
HEMATOCRIT: 30.9 % — AB (ref 34.8–46.6)
HGB: 9.7 g/dL — ABNORMAL LOW (ref 11.6–15.9)
LYMPH#: 0.5 10*3/uL — AB (ref 0.9–3.3)
LYMPH%: 7.4 % — ABNORMAL LOW (ref 14.0–49.7)
MCH: 26.4 pg (ref 25.1–34.0)
MCHC: 31.4 g/dL — AB (ref 31.5–36.0)
MCV: 84 fL (ref 79.5–101.0)
MONO#: 0.2 10*3/uL (ref 0.1–0.9)
MONO%: 3.1 % (ref 0.0–14.0)
NEUT#: 5.4 10*3/uL (ref 1.5–6.5)
NEUT%: 88.6 % — AB (ref 38.4–76.8)
Platelets: 166 10*3/uL (ref 145–400)
RBC: 3.68 10*6/uL — AB (ref 3.70–5.45)
RDW: 15.2 % — ABNORMAL HIGH (ref 11.2–14.5)
WBC: 6.1 10*3/uL (ref 3.9–10.3)

## 2015-11-08 MED ORDER — WARFARIN SODIUM 2 MG PO TABS: 2.0000 mg | ORAL_TABLET | Freq: Every day | ORAL | 2 refills | Status: DC

## 2015-11-08 NOTE — Telephone Encounter (Signed)
All previously scheduled Chemo, labs and follow up appointments was cancelled per 11/08/15 los. Lab and follow up appointment with Dr. Julien Nordmann, was scheduled for 11/21 @8 :30 a.m. Per M.D. (verbal) AVS report and appointment schedule given to patient, per 11/08/15 los.

## 2015-11-08 NOTE — Progress Notes (Signed)
[place] [phone]   [phone]  OFFICE PROGRESS NOTE  [name], MD [place] [address]  DIAGNOSIS: MGUS diagnosed in September 2010, with additional symptoms suggestive of POEMS syndrome.   PRIOR THERAPY:  1) Velcade 1.3 MG/M2 subcutaneously with Decadron 40 mg by mouth on a weekly basis. First cycle 11/24/2013. She status post 31 weekly doses of treatment. 2) Velcade 1.3 MG/M2 subcutaneously and weekly basis with Decadron 40 mg by mouth weekly. First dose 02/01/2015. Status post 28 cycles.   CURRENT THERAPY: Revlimid 25 mg by mouth daily for 21 days every 4 weeks with weekly Decadron 20 mg.   INTERVAL HISTORY: Brenda Conner 55 y.o. female returns to the clinic today for followup visit. The patient is feeling fine today with no specific complaints except for mild fatigue. She is tolerating her treatment well with no specific complaints. She continues to have mild peripheral neuropathy. She denied having any significant weight loss or night sweats. The patient denied having any fever or chills. She has no chest pain, shortness of breath, cough or hemoptysis. She had repeat myeloma panel and she is here today for evaluation and discussion of her lab results.  MEDICAL HISTORY: Past Medical History:  Diagnosis Date  . Acid reflux   . Anxiety   . Asthma   . Cancer [facility])    waldenstroms/ macroglobinulemia  . Depression   . Depression   . Diabetes mellitus without complication ([place])   . Hypercholesteremia   . Hypertension   . Hypothyroidism   . Macroglobulinemia ([place])    ? POEMS syndrome  . Multiple myeloma ([place])   . Obesity   . PONV (postoperative nausea and vomiting)   . Sleep apnea    CPAP at bedtime  . Sleep apnea     ALLERGIES:  is allergic to codeine; hydrocodone; lortab [hydrocodone-acetaminophen]; onion; shellfish allergy; and amoxicillin.  MEDICATIONS:  Current Outpatient Prescriptions    Medication Sig Dispense Refill  . acyclovir (ZOVIRAX) 400 MG tablet Take 1 tablet (400 mg total) by mouth 2 (two) times daily. 180 tablet 0  . ALPRAZolam (XANAX) 0.5 MG tablet Take 1 tablet (0.5 mg total) by mouth at bedtime as needed for anxiety. 30 tablet 0  . aspirin 81 MG tablet Take 81 mg by mouth daily.    . Azelastine HCl 0.15 % SOLN as needed.    . B-D UF III MINI PEN NEEDLES 31G X 5 MM MISC     . Blood Glucose Monitoring Suppl (ONE TOUCH ULTRA MINI) W/DEVICE KIT See admin instructions. Reported on 01/25/2015  0  . bortezomib IV (VELCADE) 3.5 MG injection Inject into the vein once.    . Cetirizine HCl (ZYRTEC ALLERGY PO) Take by mouth daily.    [name] dexamethasone (DECADRON) 4 MG tablet 5 tablet by mouth every week. Start with the first dose of chemotherapy. 40 tablet 3  . DiphenhydrAMINE HCl (BENADRYL ALLERGY PO) Take by mouth as needed.    [name] Fexofenadine HCl (ALLEGRA ALLERGY PO) Take by mouth daily.    . Fluticasone-Salmeterol (ADVAIR) 100-50 MCG/DOSE AEPB Inhale 2 puffs into the lungs every 12 (twelve) hours.    [name] ibuprofen (ADVIL,MOTRIN) 100 MG tablet Take 100 mg by mouth every 6 (six) hours as needed. Reported on 05/31/2015    . insulin glargine (LANTUS) 100 UNIT/ML injection Inject 8 Units into the skin 2 (two) times a week. Reported on 02/18/2015    . levocetirizine (XYZAL) 5  MG tablet Take 5 mg by mouth every evening.    [name] levothyroxine (SYNTHROID, LEVOTHROID) 150 MCG tablet Take 150 mcg by mouth daily before breakfast.    . lisinopril (PRINIVIL,ZESTRIL) 10 MG tablet Take 10 mg by mouth daily.  3  . metFORMIN (GLUCOPHAGE) 1000 MG tablet Take 1,000 mg by mouth 2 (two) times daily.  3  . NOVOLOG FLEXPEN 100 UNIT/ML FlexPen 3 Units daily as needed. Reported on 01/25/2015  6  . omeprazole (PRILOSEC) 40 MG capsule Take 40 mg by mouth 2 (two) times daily.     . ondansetron (ZOFRAN-ODT) 8 MG disintegrating tablet Take 8 mg by mouth every 8 (eight) hours as needed. Reported on 05/31/2015  2  .  ONE TOUCH ULTRA TEST test strip See admin instructions. Reported on 01/25/2015  11  . pravastatin (PRAVACHOL) 40 MG tablet Take 40 mg by mouth every evening.     [name] PROAIR HFA 108 (90 Base) MCG/ACT inhaler Reported on 06/21/2015  1  . sertraline (ZOLOFT) 50 MG tablet Take 3 tablets (150 mg total) by mouth daily. 270 tablet 4  . verapamil (CALAN-SR) 240 MG CR tablet Take 240 mg by mouth 2 (two) times daily.    [name] EPIPEN 2-PAK 0.3 MG/0.3ML SOAJ injection Reported on 06/21/2015  1   No current facility-administered medications for this visit.     SURGICAL HISTORY:  Past Surgical History:  Procedure Laterality Date  . ABLATION  09/2007   HTA and polyp resection  . BACK SURGERY  03/2004   herniation, L4-L5  . Bil Laprascopic knee surgery    . BONE MARROW BIOPSY     2011  . BONE MARROW BIOPSY  4/14  . CHOLECYSTECTOMY    . FOOT SURGERY  1998  . KNEE ARTHROSCOPY W/ MENISCAL REPAIR  10/10 ; 3/11  . LAPAROSCOPIC CHOLECYSTECTOMY  1997  . NASAL SINUS SURGERY  2004  . UTERINE FIBROID EMBOLIZATION      REVIEW OF SYSTEMS:  Constitutional: negative Eyes: negative Ears, nose, mouth, throat, and face: negative Respiratory: negative Cardiovascular: negative Gastrointestinal: negative Genitourinary:negative Integument/breast: negative Hematologic/lymphatic: negative Musculoskeletal:negative Neurological: positive for paresthesia Behavioral/Psych: negative Endocrine: negative Allergic/Immunologic: negative   PHYSICAL EXAMINATION: General appearance: alert, cooperative and no distress Head: Normocephalic, without obvious abnormality, atraumatic Neck: no adenopathy Lymph nodes: Cervical, supraclavicular, and axillary nodes normal. Resp: clear to auscultation bilaterally Back: symmetric, no curvature. ROM normal. No CVA tenderness. Cardio: regular rate and rhythm, S1, S2 normal, no murmur, click, rub or gallop GI: soft, non-tender; bowel sounds normal; no masses,  no organomegaly Extremities:  extremities normal, atraumatic, no cyanosis or edema Neurologic: Alert and oriented X 3, normal strength and tone. Normal symmetric reflexes. Normal coordination and gait  ECOG PERFORMANCE STATUS: 0 - Asymptomatic  Blood pressure (!) 129/51, pulse 68, temperature 98.4 F (36.9 C), temperature source Oral, resp. rate 16, height 5' 7"  (1.702 m), weight 266 lb 6.4 oz (120.8 kg), SpO2 99 %.  LABORATORY DATA: Lab Results  Component Value Date   WBC 6.1 11/08/2015   HGB 9.7 (L) 11/08/2015   HCT 30.9 (L) 11/08/2015   MCV 84.0 11/08/2015   PLT 166 11/08/2015      Chemistry      Component Value Date/Time   NA 139 11/08/2015 1139   K 4.2 11/08/2015 1139   CL 104 04/07/2012 1415   CO2 22 11/08/2015 1139   BUN 9.0 11/08/2015 1139   CREATININE 0.7 11/08/2015 1139      Component Value Date/Time  CALCIUM 9.8 11/08/2015 1139   ALKPHOS 99 11/08/2015 1139   AST 38 (H) 11/08/2015 1139   ALT 40 11/08/2015 1139   BILITOT 0.47 11/08/2015 1139     Other lab results: Beta-2 microglobulin 2.0, free kappa light chain 9.1, free lambda light chain1000.9 with a kappa/lambda ratio of 0.01. IgG 356, IgA 79 and IgM 658.  ASSESSMENT AND PLAN: This is a very pleasant 55 years old white female with a multiple myeloma initially diagnosed as monoclonal gammopathy in addition to peripheral neuropathy, and Endocrinopathy in the form of hypothyroidism and skin rash. The symptoms combined are suspicious for POEMS syndrome.  She completed treatment with subcutaneous tenderness Velcade and Decadron status post 31 cycles and tolerated her treatment fairly well. She has been on observation for the last 6 months. 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 She resumed her treatment with subcutaneous Velcade and Decadron status post 28 cycles. She is tolerating her treatment fairly well.  The recent myeloma panel  showed further increase in the free lambda light chain concerning for disease progression. I discussed the lab result with the patient today. I recommended for her to discontinue her current treatment with Velcade. I discussed with the patient alternative treatment with Revlimid 25 mg daily for 21 days every 4 weeks in addition to Decadron 20 mg by mouth on a weekly basis. I discussed with the patient adverse effect of this treatment including the risk for teratogenic effect if she becomes pregnant. She was strongly advise him to have appropriate contraceptive precautions. I also discussed with the patient adverse effect of this treatment including myelosuppression, nausea and vomiting, peripheral neuropathy and increased risk for deep venous thrombosis. I will also start her on low-dose Coumadin 2 mg by mouth daily for DVT prophylaxis I will see her back for follow-up visit in 3-4  weeks for evaluation and management of any adverse effect of her treatment. The patient agreed to the current plan.  She was advised to call immediately if she has any concerning symptoms in the interval.  All questions were answered. The patient knows to call the clinic with any problems, questions or concerns. We can certainly see the patient much sooner if necessary.  Disclaimer: This note was dictated with voice recognition software. Similar sounding words can inadvertently be transcribed and may not be corrected upon review.

## 2015-11-08 NOTE — Telephone Encounter (Signed)
Revlimid rx faxed to [facility]. 

## 2015-11-08 NOTE — Telephone Encounter (Signed)
FAXED REVLIMID AGREEMENT FORM. RX GIVEN TO

## 2015-11-12 DIAGNOSIS — M4003 Postural kyphosis, cervicothoracic region: Secondary | ICD-10-CM | POA: Diagnosis not present

## 2015-11-12 DIAGNOSIS — M542 Cervicalgia: Secondary | ICD-10-CM | POA: Diagnosis not present

## 2015-11-15 ENCOUNTER — Other Ambulatory Visit: Payer: BLUE CROSS/BLUE SHIELD

## 2015-11-15 ENCOUNTER — Ambulatory Visit: Payer: BLUE CROSS/BLUE SHIELD

## 2015-11-16 ENCOUNTER — Other Ambulatory Visit: Payer: Self-pay | Admitting: Medical Oncology

## 2015-11-16 ENCOUNTER — Encounter: Payer: Self-pay | Admitting: Internal Medicine

## 2015-11-16 ENCOUNTER — Telehealth: Payer: Self-pay | Admitting: Medical Oncology

## 2015-11-16 MED ORDER — LENALIDOMIDE 25 MG PO CAPS: 25.0000 mg | ORAL_CAPSULE | Freq: Every day | ORAL | 0 refills | Status: DC

## 2015-11-16 NOTE — Telephone Encounter (Signed)
Faxed PA for revlimid to [facility]

## 2015-11-22 ENCOUNTER — Other Ambulatory Visit: Payer: BLUE CROSS/BLUE SHIELD

## 2015-11-22 ENCOUNTER — Ambulatory Visit: Payer: BLUE CROSS/BLUE SHIELD

## 2015-11-29 ENCOUNTER — Ambulatory Visit (HOSPITAL_BASED_OUTPATIENT_CLINIC_OR_DEPARTMENT_OTHER): Payer: BLUE CROSS/BLUE SHIELD | Admitting: Internal Medicine

## 2015-11-29 ENCOUNTER — Other Ambulatory Visit: Payer: BLUE CROSS/BLUE SHIELD

## 2015-11-29 ENCOUNTER — Encounter: Payer: Self-pay | Admitting: Internal Medicine

## 2015-11-29 ENCOUNTER — Telehealth: Payer: Self-pay | Admitting: Internal Medicine

## 2015-11-29 ENCOUNTER — Ambulatory Visit: Payer: BLUE CROSS/BLUE SHIELD

## 2015-11-29 ENCOUNTER — Other Ambulatory Visit (HOSPITAL_BASED_OUTPATIENT_CLINIC_OR_DEPARTMENT_OTHER): Payer: BLUE CROSS/BLUE SHIELD

## 2015-11-29 VITALS — BP 129/59 | HR 66 | Temp 97.8°F | Resp 18 | Ht 67.0 in | Wt 263.3 lb

## 2015-11-29 DIAGNOSIS — C9000 Multiple myeloma not having achieved remission: Secondary | ICD-10-CM

## 2015-11-29 DIAGNOSIS — R21 Rash and other nonspecific skin eruption: Secondary | ICD-10-CM | POA: Diagnosis not present

## 2015-11-29 DIAGNOSIS — G609 Hereditary and idiopathic neuropathy, unspecified: Secondary | ICD-10-CM | POA: Diagnosis not present

## 2015-11-29 DIAGNOSIS — E039 Hypothyroidism, unspecified: Secondary | ICD-10-CM | POA: Diagnosis not present

## 2015-11-29 DIAGNOSIS — Z5111 Encounter for antineoplastic chemotherapy: Secondary | ICD-10-CM

## 2015-11-29 LAB — CBC WITH DIFFERENTIAL/PLATELET
BASO%: 1.1 % (ref 0.0–2.0)
BASOS ABS: 0.1 10*3/uL (ref 0.0–0.1)
EOS ABS: 0.1 10*3/uL (ref 0.0–0.5)
EOS%: 2.1 % (ref 0.0–7.0)
HEMATOCRIT: 31.1 % — AB (ref 34.8–46.6)
HEMOGLOBIN: 9.9 g/dL — AB (ref 11.6–15.9)
LYMPH#: 1.3 10*3/uL (ref 0.9–3.3)
LYMPH%: 23.3 % (ref 14.0–49.7)
MCH: 26.3 pg (ref 25.1–34.0)
MCHC: 31.9 g/dL (ref 31.5–36.0)
MCV: 82.6 fL (ref 79.5–101.0)
MONO#: 0.4 10*3/uL (ref 0.1–0.9)
MONO%: 7 % (ref 0.0–14.0)
NEUT#: 3.8 10*3/uL (ref 1.5–6.5)
NEUT%: 66.5 % (ref 38.4–76.8)
Platelets: 187 10*3/uL (ref 145–400)
RBC: 3.76 10*6/uL (ref 3.70–5.45)
RDW: 16 % — AB (ref 11.2–14.5)
WBC: 5.8 10*3/uL (ref 3.9–10.3)

## 2015-11-29 LAB — COMPREHENSIVE METABOLIC PANEL
ALT: 51 U/L (ref 0–55)
AST: 62 U/L — AB (ref 5–34)
Albumin: 3.8 g/dL (ref 3.5–5.0)
Alkaline Phosphatase: 87 U/L (ref 40–150)
Anion Gap: 12 mEq/L — ABNORMAL HIGH (ref 3–11)
BUN: 14.6 mg/dL (ref 7.0–26.0)
CHLORIDE: 107 meq/L (ref 98–109)
CO2: 21 mEq/L — ABNORMAL LOW (ref 22–29)
Calcium: 9.7 mg/dL (ref 8.4–10.4)
Creatinine: 0.7 mg/dL (ref 0.6–1.1)
Glucose: 117 mg/dl (ref 70–140)
POTASSIUM: 3.8 meq/L (ref 3.5–5.1)
Sodium: 140 mEq/L (ref 136–145)
Total Bilirubin: 0.5 mg/dL (ref 0.20–1.20)
Total Protein: 7.2 g/dL (ref 6.4–8.3)

## 2015-11-29 LAB — LACTATE DEHYDROGENASE: LDH: 165 U/L (ref 125–245)

## 2015-11-29 NOTE — Telephone Encounter (Signed)
Appointments scheduled per 11/29/15 los. A copy of the AVS report and appointment schedule, was given to patient, per 11/29/15 los. °

## 2015-11-29 NOTE — Progress Notes (Signed)
[place] [phone]   [phone]  OFFICE PROGRESS NOTE  [name], MD [place] [address]  DIAGNOSIS: MGUS diagnosed in September 2010, with additional symptoms suggestive of POEMS syndrome.   PRIOR THERAPY:  1) Velcade 1.3 MG/M2 subcutaneously with Decadron 40 mg by mouth on a weekly basis. First cycle 11/24/2013. She status post 31 weekly doses of treatment. 2) Velcade 1.3 MG/M2 subcutaneously and weekly basis with Decadron 40 mg by mouth weekly. First dose 02/01/2015. Status post 28 cycles.   CURRENT THERAPY: Revlimid 25 mg by mouth daily for 21 days every 4 weeks with weekly Decadron 20 mg. started in 11/27/2015   INTERVAL HISTORY: Brenda Conner 55 y.o. female returns to the clinic today for followup visit. The patient is feeling fine today with no specific complaints except for mild fatigue and peripheral neuropathy. She started Revlimid 2 days ago. She was out of town the previous weeks and she was afraid to start the treatment well she is there. She denied having any significant weight loss or night sweats. The patient denied having any fever or chills. She has no chest pain, shortness of breath, cough or hemoptysis. She has repeat CBC and comprehensive metabolic panel performed earlier today and she is here for evaluation and discussion of her lab results.  MEDICAL HISTORY: Past Medical History:  Diagnosis Date  . Acid reflux   . Anxiety   . Asthma   . Cancer [facility])    waldenstroms/ macroglobinulemia  . Depression   . Depression   . Diabetes mellitus without complication ([place])   . Hypercholesteremia   . Hypertension   . Hypothyroidism   . Macroglobulinemia ([place])    ? POEMS syndrome  . Multiple myeloma ([place])   . Obesity   . PONV (postoperative nausea and vomiting)   . Sleep apnea    CPAP at bedtime  . Sleep apnea     ALLERGIES:  is allergic to codeine; hydrocodone; lortab  [hydrocodone-acetaminophen]; onion; shellfish allergy; and amoxicillin.  MEDICATIONS:  Current Outpatient Prescriptions  Medication Sig Dispense Refill  . acyclovir (ZOVIRAX) 400 MG tablet Take 1 tablet (400 mg total) by mouth 2 (two) times daily. 180 tablet 0  . ALPRAZolam (XANAX) 0.5 MG tablet Take 1 tablet (0.5 mg total) by mouth at bedtime as needed for anxiety. 30 tablet 0  . aspirin 81 MG tablet Take 81 mg by mouth daily.    . Azelastine HCl 0.15 % SOLN as needed.    . B-D UF III MINI PEN NEEDLES 31G X 5 MM MISC     . Blood Glucose Monitoring Suppl (ONE TOUCH ULTRA MINI) W/DEVICE KIT See admin instructions. Reported on 01/25/2015  0  . bortezomib IV (VELCADE) 3.5 MG injection Inject into the vein once.    . Cetirizine HCl (ZYRTEC ALLERGY PO) Take by mouth daily.    [name] dexamethasone (DECADRON) 4 MG tablet 5 tablet by mouth every week. Start with the first dose of chemotherapy. 40 tablet 3  . DiphenhydrAMINE HCl (BENADRYL ALLERGY PO) Take by mouth as needed.    [name] EPIPEN 2-PAK 0.3 MG/0.3ML SOAJ injection Reported on 06/21/2015  1  . Fexofenadine HCl (ALLEGRA ALLERGY PO) Take by mouth daily.    . Fluticasone-Salmeterol (ADVAIR) 100-50 MCG/DOSE AEPB Inhale 2 puffs into the lungs every 12 (twelve) hours.    [name] ibuprofen (ADVIL,MOTRIN) 100 MG tablet Take 100 mg by mouth every 6 (six) hours as needed. Reported on  05/31/2015    . insulin glargine (LANTUS) 100 UNIT/ML injection Inject 8 Units into the skin 2 (two) times a week. Reported on 02/18/2015    . lenalidomide (REVLIMID) 25 MG capsule Take 1 capsule (25 mg total) by mouth daily. For 21 days then rest for 7 days every 4 weeks Josem Kaufmann [id number] 10/31 21 capsule 0  . levocetirizine (XYZAL) 5 MG tablet Take 5 mg by mouth every evening.    [name] levothyroxine (SYNTHROID, LEVOTHROID) 150 MCG tablet Take 150 mcg by mouth daily before breakfast.    . lisinopril (PRINIVIL,ZESTRIL) 10 MG tablet Take 10 mg by mouth daily.  3  . metFORMIN (GLUCOPHAGE) 1000 MG  tablet Take 1,000 mg by mouth 2 (two) times daily.  3  . NOVOLOG FLEXPEN 100 UNIT/ML FlexPen 3 Units daily as needed. Reported on 01/25/2015  6  . omeprazole (PRILOSEC) 40 MG capsule Take 40 mg by mouth 2 (two) times daily.     . ondansetron (ZOFRAN-ODT) 8 MG disintegrating tablet Take 8 mg by mouth every 8 (eight) hours as needed. Reported on 05/31/2015  2  . ONE TOUCH ULTRA TEST test strip See admin instructions. Reported on 01/25/2015  11  . pravastatin (PRAVACHOL) 40 MG tablet Take 40 mg by mouth every evening.     [name] PROAIR HFA 108 (90 Base) MCG/ACT inhaler Reported on 06/21/2015  1  . sertraline (ZOLOFT) 50 MG tablet Take 3 tablets (150 mg total) by mouth daily. 270 tablet 4  . verapamil (CALAN-SR) 240 MG CR tablet Take 240 mg by mouth 2 (two) times daily.    [name] warfarin (COUMADIN) 2 MG tablet Take 1 tablet (2 mg total) by mouth daily. 30 tablet 2   No current facility-administered medications for this visit.     SURGICAL HISTORY:  Past Surgical History:  Procedure Laterality Date  . ABLATION  09/2007   HTA and polyp resection  . BACK SURGERY  03/2004   herniation, L4-L5  . Bil Laprascopic knee surgery    . BONE MARROW BIOPSY     2011  . BONE MARROW BIOPSY  4/14  . CHOLECYSTECTOMY    . FOOT SURGERY  1998  . KNEE ARTHROSCOPY W/ MENISCAL REPAIR  10/10 ; 3/11  . LAPAROSCOPIC CHOLECYSTECTOMY  1997  . NASAL SINUS SURGERY  2004  . UTERINE FIBROID EMBOLIZATION      REVIEW OF SYSTEMS:  A comprehensive review of systems was negative except for: Constitutional: positive for fatigue Neurological: positive for paresthesia   PHYSICAL EXAMINATION: General appearance: alert, cooperative and no distress Head: Normocephalic, without obvious abnormality, atraumatic Neck: no adenopathy Lymph nodes: Cervical, supraclavicular, and axillary nodes normal. Resp: clear to auscultation bilaterally Back: symmetric, no curvature. ROM normal. No CVA tenderness. Cardio: regular rate and rhythm, S1, S2  normal, no murmur, click, rub or gallop GI: soft, non-tender; bowel sounds normal; no masses,  no organomegaly Extremities: extremities normal, atraumatic, no cyanosis or edema Neurologic: Alert and oriented X 3, normal strength and tone. Normal symmetric reflexes. Normal coordination and gait  ECOG PERFORMANCE STATUS: 0 - Asymptomatic  Blood pressure (!) 129/59, pulse 66, temperature 97.8 F (36.6 C), temperature source Oral, resp. rate 18, height 5' 7"  (1.702 m), weight 263 lb 4.8 oz (119.4 kg), SpO2 100 %.  LABORATORY DATA: Lab Results  Component Value Date   WBC 5.8 11/29/2015   HGB 9.9 (L) 11/29/2015   HCT 31.1 (L) 11/29/2015   MCV 82.6 11/29/2015   PLT 187 11/29/2015  Chemistry      Component Value Date/Time   NA 140 11/29/2015 0746   K 3.8 11/29/2015 0746   CL 104 04/07/2012 1415   CO2 21 (L) 11/29/2015 0746   BUN 14.6 11/29/2015 0746   CREATININE 0.7 11/29/2015 0746      Component Value Date/Time   CALCIUM 9.7 11/29/2015 0746   ALKPHOS 87 11/29/2015 0746   AST 62 (H) 11/29/2015 0746   ALT 51 11/29/2015 0746   BILITOT 0.50 11/29/2015 0746     Other lab results: Beta-2 microglobulin 2.0, free kappa light chain 9.1, free lambda light chain1000.9 with a kappa/lambda ratio of 0.01. IgG 356, IgA 79 and IgM 658.  ASSESSMENT AND PLAN: This is a very pleasant 55 years old white female with a multiple myeloma initially diagnosed as monoclonal gammopathy in addition to peripheral neuropathy, and Endocrinopathy in the form of hypothyroidism and skin rash. The symptoms combined are suspicious for POEMS syndrome.  She completed treatment with subcutaneous tenderness Velcade and Decadron status post 31 cycles and tolerated her treatment fairly well. She has been on observation for the last 6 months. 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 She resumed her treatment  with subcutaneous Velcade and Decadron status post 28 cycles. She is tolerating her treatment fairly well.  The recent myeloma panel showed further increase in the free lambda light chain concerning for disease progression. She is currently on treatment with Revlimid and Decadron started few days ago. She is feeling fine with no specific complaints. I recommended for the patient to continue her treatment as scheduled. For DVT prophylaxis, she will continue on Coumadin 2 mg by mouth daily. I will see her back for follow-up visit in 3  weeks for evaluation and management of any adverse effect of her treatment. She was advised to call immediately if she has any concerning symptoms in the interval.  All questions were answered. The patient knows to call the clinic with any problems, questions or concerns. We can certainly see the patient much sooner if necessary.  Disclaimer: This note was dictated with voice recognition software. Similar sounding words can inadvertently be transcribed and may not be corrected upon review.

## 2015-12-05 ENCOUNTER — Other Ambulatory Visit: Payer: Self-pay | Admitting: Internal Medicine

## 2015-12-06 ENCOUNTER — Ambulatory Visit: Payer: BLUE CROSS/BLUE SHIELD

## 2015-12-06 ENCOUNTER — Other Ambulatory Visit: Payer: BLUE CROSS/BLUE SHIELD

## 2015-12-09 ENCOUNTER — Other Ambulatory Visit: Payer: Self-pay | Admitting: Medical Oncology

## 2015-12-09 ENCOUNTER — Telehealth: Payer: Self-pay | Admitting: *Deleted

## 2015-12-09 DIAGNOSIS — C9000 Multiple myeloma not having achieved remission: Secondary | ICD-10-CM

## 2015-12-09 MED ORDER — WARFARIN SODIUM 2 MG PO TABS: 2.0000 mg | ORAL_TABLET | Freq: Every day | ORAL | 0 refills | Status: DC

## 2015-12-09 NOTE — Telephone Encounter (Signed)
Pharmacy called- needs [employer] authorization number for Revlimid

## 2015-12-10 DIAGNOSIS — M4003 Postural kyphosis, cervicothoracic region: Secondary | ICD-10-CM | POA: Diagnosis not present

## 2015-12-10 DIAGNOSIS — M542 Cervicalgia: Secondary | ICD-10-CM | POA: Diagnosis not present

## 2015-12-12 ENCOUNTER — Other Ambulatory Visit: Payer: Self-pay | Admitting: *Deleted

## 2015-12-12 MED ORDER — REVLIMID 25 MG PO CAPS: ORAL_CAPSULE | ORAL | 2 refills | Status: DC

## 2015-12-12 NOTE — Telephone Encounter (Signed)
Rx revlimid faxed to pt pharmacy

## 2015-12-13 ENCOUNTER — Other Ambulatory Visit: Payer: BLUE CROSS/BLUE SHIELD

## 2015-12-13 ENCOUNTER — Ambulatory Visit: Payer: BLUE CROSS/BLUE SHIELD

## 2015-12-27 ENCOUNTER — Other Ambulatory Visit (HOSPITAL_BASED_OUTPATIENT_CLINIC_OR_DEPARTMENT_OTHER): Payer: BLUE CROSS/BLUE SHIELD

## 2015-12-27 ENCOUNTER — Encounter: Payer: Self-pay | Admitting: Internal Medicine

## 2015-12-27 ENCOUNTER — Telehealth: Payer: Self-pay | Admitting: Internal Medicine

## 2015-12-27 ENCOUNTER — Ambulatory Visit (HOSPITAL_BASED_OUTPATIENT_CLINIC_OR_DEPARTMENT_OTHER): Payer: BLUE CROSS/BLUE SHIELD | Admitting: Internal Medicine

## 2015-12-27 VITALS — BP 142/77 | HR 70 | Temp 98.0°F | Resp 18 | Ht 67.0 in | Wt 263.8 lb

## 2015-12-27 DIAGNOSIS — C9000 Multiple myeloma not having achieved remission: Secondary | ICD-10-CM | POA: Diagnosis not present

## 2015-12-27 DIAGNOSIS — R197 Diarrhea, unspecified: Secondary | ICD-10-CM | POA: Diagnosis not present

## 2015-12-27 DIAGNOSIS — Z5111 Encounter for antineoplastic chemotherapy: Secondary | ICD-10-CM

## 2015-12-27 DIAGNOSIS — G620 Drug-induced polyneuropathy: Secondary | ICD-10-CM | POA: Diagnosis not present

## 2015-12-27 LAB — COMPREHENSIVE METABOLIC PANEL
ALBUMIN: 3.7 g/dL (ref 3.5–5.0)
ALK PHOS: 186 U/L — AB (ref 40–150)
ALT: 89 U/L — ABNORMAL HIGH (ref 0–55)
AST: 70 U/L — AB (ref 5–34)
Anion Gap: 11 mEq/L (ref 3–11)
BILIRUBIN TOTAL: 0.72 mg/dL (ref 0.20–1.20)
BUN: 13.4 mg/dL (ref 7.0–26.0)
CO2: 22 mEq/L (ref 22–29)
Calcium: 9.5 mg/dL (ref 8.4–10.4)
Chloride: 105 mEq/L (ref 98–109)
Creatinine: 0.8 mg/dL (ref 0.6–1.1)
EGFR: 89 mL/min/{1.73_m2} — ABNORMAL LOW (ref >90)
GLUCOSE: 146 mg/dL — AB (ref 70–140)
Potassium: 3.9 mEq/L (ref 3.5–5.1)
Sodium: 138 mEq/L (ref 136–145)
TOTAL PROTEIN: 7.2 g/dL (ref 6.4–8.3)

## 2015-12-27 LAB — CBC WITH DIFFERENTIAL/PLATELET
BASO%: 0.7 % (ref 0.0–2.0)
Basophils Absolute: 0 10*3/uL (ref 0.0–0.1)
EOS ABS: 0 10*3/uL (ref 0.0–0.5)
EOS%: 0 % (ref 0.0–7.0)
HCT: 28.4 % — ABNORMAL LOW (ref 34.8–46.6)
HEMOGLOBIN: 9.2 g/dL — AB (ref 11.6–15.9)
LYMPH%: 9 % — ABNORMAL LOW (ref 14.0–49.7)
MCH: 26.6 pg (ref 25.1–34.0)
MCHC: 32.2 g/dL (ref 31.5–36.0)
MCV: 82.4 fL (ref 79.5–101.0)
MONO#: 0.5 10*3/uL (ref 0.1–0.9)
MONO%: 11.1 % (ref 0.0–14.0)
NEUT%: 79.2 % — ABNORMAL HIGH (ref 38.4–76.8)
NEUTROS ABS: 3.9 10*3/uL (ref 1.5–6.5)
Platelets: 178 10*3/uL (ref 145–400)
RBC: 3.45 10*6/uL — ABNORMAL LOW (ref 3.70–5.45)
RDW: 16.6 % — AB (ref 11.2–14.5)
WBC: 5 10*3/uL (ref 3.9–10.3)
lymph#: 0.4 10*3/uL — ABNORMAL LOW (ref 0.9–3.3)

## 2015-12-27 LAB — LACTATE DEHYDROGENASE: LDH: 164 U/L (ref 125–245)

## 2015-12-27 NOTE — Progress Notes (Signed)
[place] [phone]   [phone]  OFFICE PROGRESS NOTE  Brenda Kern, MD [place] [address]  DIAGNOSIS: Plasma cell dyscrasia initially diagnosed as MGUS in September 2010, with additional symptoms suggestive of POEMS syndrome.   PRIOR THERAPY:  1) Velcade 1.3 MG/M2 subcutaneously with Decadron 40 mg by mouth on a weekly basis. First cycle 11/24/2013. She status post 31 weekly doses of treatment. 2) Velcade 1.3 MG/M2 subcutaneously and weekly basis with Decadron 40 mg by mouth weekly. First dose 02/01/2015. Status post 28 cycles.   CURRENT THERAPY: Revlimid 25 mg by mouth daily for 21 days every 4 weeks with weekly Decadron 20 mg. started in 11/27/2015   INTERVAL HISTORY: Brenda Conner 55 y.o. female returns to the clinic today for follow-up visit. She is currently on treatment with Revlimid status post 1 months. She is tolerating her treatment well except for frequent episodes of diarrhea. She takes Imodium on a regular basis. She denied having any recent weight loss or night sweats. She has no nausea, vomiting, diarrhea or constipation. She continues to have mild peripheral neuropathy. She has no chest pain, shortness breath, cough or hemoptysis. She is planning to spend the Christmas time with her family in [place]. She has repeat CBC and comprehensive metabolic panel performed earlier today and she is here for evaluation and discussion of her lab results.  MEDICAL HISTORY: Past Medical History:  Diagnosis Date  . Acid reflux   . Anxiety   . Asthma   . Cancer [facility])    waldenstroms/ macroglobinulemia  . Depression   . Depression   . Diabetes mellitus without complication ([place])   . Hypercholesteremia   . Hypertension   . Hypothyroidism   . Macroglobulinemia ([place])    ? POEMS syndrome  . Multiple myeloma ([place])   . Obesity   . PONV (postoperative nausea and vomiting)   . Sleep apnea    CPAP at bedtime  .  Sleep apnea     ALLERGIES:  is allergic to codeine; hydrocodone; lortab [hydrocodone-acetaminophen]; onion; shellfish allergy; and amoxicillin.  MEDICATIONS:  Current Outpatient Prescriptions  Medication Sig Dispense Refill  . acyclovir (ZOVIRAX) 400 MG tablet Take 1 tablet (400 mg total) by mouth 2 (two) times daily. 180 tablet 0  . ALPRAZolam (XANAX) 0.5 MG tablet Take 1 tablet (0.5 mg total) by mouth at bedtime as needed for anxiety. 30 tablet 0  . aspirin 81 MG tablet Take 81 mg by mouth daily.    . Azelastine HCl 0.15 % SOLN as needed.    . B-D UF III MINI PEN NEEDLES 31G X 5 MM MISC     . Blood Glucose Monitoring Suppl (ONE TOUCH ULTRA MINI) W/DEVICE KIT See admin instructions. Reported on 01/25/2015  0  . bortezomib IV (VELCADE) 3.5 MG injection Inject into the vein once.    . Cetirizine HCl (ZYRTEC ALLERGY PO) Take by mouth daily.    [name] dexamethasone (DECADRON) 4 MG tablet 5 tablet by mouth every week. Start with the first dose of chemotherapy. 40 tablet 3  . DiphenhydrAMINE HCl (BENADRYL ALLERGY PO) Take by mouth as needed.    [name] EPIPEN 2-PAK 0.3 MG/0.3ML SOAJ injection Reported on 06/21/2015  1  . Fexofenadine HCl (ALLEGRA ALLERGY PO) Take by mouth daily.    . Fluticasone-Salmeterol (ADVAIR) 100-50 MCG/DOSE AEPB Inhale 2 puffs into the lungs every 12 (twelve) hours.    [name] ibuprofen (ADVIL,MOTRIN) 100 MG tablet Take 100  mg by mouth every 6 (six) hours as needed. Reported on 05/31/2015    . insulin glargine (LANTUS) 100 UNIT/ML injection Inject 8 Units into the skin 2 (two) times a week. Reported on 02/18/2015    . levocetirizine (XYZAL) 5 MG tablet Take 5 mg by mouth every evening.    [name] levothyroxine (SYNTHROID, LEVOTHROID) 150 MCG tablet Take 150 mcg by mouth daily before breakfast.    . lisinopril (PRINIVIL,ZESTRIL) 10 MG tablet Take 10 mg by mouth daily.  3  . metFORMIN (GLUCOPHAGE) 1000 MG tablet Take 1,000 mg by mouth 2 (two) times daily.  3  . NOVOLOG FLEXPEN 100 UNIT/ML FlexPen 3  Units daily as needed. Reported on 01/25/2015  6  . omeprazole (PRILOSEC) 40 MG capsule Take 40 mg by mouth 2 (two) times daily.     . ondansetron (ZOFRAN-ODT) 8 MG disintegrating tablet Take 8 mg by mouth every 8 (eight) hours as needed. Reported on 05/31/2015  2  . ONE TOUCH ULTRA TEST test strip See admin instructions. Reported on 01/25/2015  11  . pravastatin (PRAVACHOL) 40 MG tablet Take 40 mg by mouth every evening.     [name] PROAIR HFA 108 (90 Base) MCG/ACT inhaler Reported on 06/21/2015  1  . REVLIMID 25 MG capsule TAKE 1 CAPSULE BY MOUTH DAILY FOR 21 DAYS AND REST FOR 7 DAYS, EVERY 4WEEKS Authorization# [id number] 21 capsule 2  . sertraline (ZOLOFT) 50 MG tablet Take 3 tablets (150 mg total) by mouth daily. 270 tablet 4  . verapamil (CALAN-SR) 240 MG CR tablet Take 240 mg by mouth 2 (two) times daily.    [name] warfarin (COUMADIN) 2 MG tablet Take 1 tablet (2 mg total) by mouth daily. 90 tablet 0   No current facility-administered medications for this visit.     SURGICAL HISTORY:  Past Surgical History:  Procedure Laterality Date  . ABLATION  09/2007   HTA and polyp resection  . BACK SURGERY  03/2004   herniation, L4-L5  . Bil Laprascopic knee surgery    . BONE MARROW BIOPSY     2011  . BONE MARROW BIOPSY  4/14  . CHOLECYSTECTOMY    . FOOT SURGERY  1998  . KNEE ARTHROSCOPY W/ MENISCAL REPAIR  10/10 ; 3/11  . LAPAROSCOPIC CHOLECYSTECTOMY  1997  . NASAL SINUS SURGERY  2004  . UTERINE FIBROID EMBOLIZATION      REVIEW OF SYSTEMS:  A comprehensive review of systems was negative except for: Constitutional: positive for fatigue Gastrointestinal: positive for diarrhea Neurological: positive for paresthesia   PHYSICAL EXAMINATION: General appearance: alert, cooperative, fatigued and no distress Head: Normocephalic, without obvious abnormality, atraumatic Neck: no adenopathy Lymph nodes: Cervical, supraclavicular, and axillary nodes normal. Resp: clear to auscultation bilaterally Back:  symmetric, no curvature. ROM normal. No CVA tenderness. Cardio: regular rate and rhythm, S1, S2 normal, no murmur, click, rub or gallop GI: soft, non-tender; bowel sounds normal; no masses,  no organomegaly Extremities: extremities normal, atraumatic, no cyanosis or edema  ECOG PERFORMANCE STATUS: 0 - Asymptomatic  Blood pressure (!) 142/77, pulse 70, temperature 98 F (36.7 C), temperature source Oral, resp. rate 18, height _0  (1.702 m), weight 263 lb 12.8 oz (119.7 kg), SpO2 100 %.  LABORATORY DATA: Lab Results  Component Value Date   WBC 5.0 12/27/2015   HGB 9.2 (L) 12/27/2015   HCT 28.4 (L) 12/27/2015   MCV 82.4 12/27/2015   PLT 178 12/27/2015      Chemistry  Component Value Date/Time   NA 140 11/29/2015 0746   K 3.8 11/29/2015 0746   CL 104 04/07/2012 1415   CO2 21 (L) 11/29/2015 0746   BUN 14.6 11/29/2015 0746   CREATININE 0.7 11/29/2015 0746      Component Value Date/Time   CALCIUM 9.7 11/29/2015 0746   ALKPHOS 87 11/29/2015 0746   AST 62 (H) 11/29/2015 0746   ALT 51 11/29/2015 0746   BILITOT 0.50 11/29/2015 0746      ASSESSMENT AND PLAN: This is a very pleasant 55 years old white female with multiple myeloma status post previous treatment with Velcade and Decadron discontinued secondary to disease progression.  She was recently started on treatment with Revlimid and Decadron status post 1 months of treatment. She is tolerating the treatment well except for few episodes of diarrhea and she is taking Imodium as needed. CBC today is unremarkable except for mild anemia. I recommended for the patient to continue her current treatment with Revlimid. I will see her back for follow-up visit in one month's for evaluation with repeat CBC, comprehensive metabolic panel and LDH. For DVT prophylaxis, she will continue on Coumadin 2 mg by mouth daily. She was advised to call immediately if she has any concerning symptoms in the interval.  All questions were answered.  The patient knows to call the clinic with any problems, questions or concerns. We can certainly see the patient much sooner if necessary. I spent 10 minutes counseling the patient face to face. The total time spent in the appointment was 15 minutes. Disclaimer: This note was dictated with voice recognition software. Similar sounding words can inadvertently be transcribed and may not be corrected upon review.

## 2015-12-27 NOTE — Telephone Encounter (Signed)
Appointments scheduled per 12/19 LOS. Patient given AVS report and calendars with future scheduled appointments. °

## 2015-12-29 DIAGNOSIS — G4733 Obstructive sleep apnea (adult) (pediatric): Secondary | ICD-10-CM | POA: Diagnosis not present

## 2016-01-04 ENCOUNTER — Other Ambulatory Visit: Payer: Self-pay | Admitting: Internal Medicine

## 2016-01-11 ENCOUNTER — Encounter: Payer: Self-pay | Admitting: Internal Medicine

## 2016-01-12 ENCOUNTER — Encounter: Payer: Self-pay | Admitting: Medical Oncology

## 2016-01-12 ENCOUNTER — Other Ambulatory Visit: Payer: Self-pay | Admitting: Internal Medicine

## 2016-01-12 ENCOUNTER — Telehealth: Payer: Self-pay | Admitting: Medical Oncology

## 2016-01-12 DIAGNOSIS — C9000 Multiple myeloma not having achieved remission: Secondary | ICD-10-CM

## 2016-01-12 NOTE — Telephone Encounter (Signed)
err

## 2016-01-14 DIAGNOSIS — M4003 Postural kyphosis, cervicothoracic region: Secondary | ICD-10-CM | POA: Diagnosis not present

## 2016-01-14 DIAGNOSIS — M542 Cervicalgia: Secondary | ICD-10-CM | POA: Diagnosis not present

## 2016-01-17 ENCOUNTER — Encounter: Payer: Self-pay | Admitting: Internal Medicine

## 2016-01-17 ENCOUNTER — Other Ambulatory Visit: Payer: Self-pay | Admitting: Internal Medicine

## 2016-01-17 DIAGNOSIS — C9000 Multiple myeloma not having achieved remission: Secondary | ICD-10-CM

## 2016-01-17 NOTE — Telephone Encounter (Signed)
"  We need [employer] Authorization number for the Revlimid refill order."  Obtained and provided number [id number] with this call.

## 2016-01-18 MED ORDER — ACYCLOVIR 400 MG PO TABS: 400.0000 mg | ORAL_TABLET | Freq: Two times a day (BID) | ORAL | 0 refills | Status: DC

## 2016-01-24 ENCOUNTER — Other Ambulatory Visit (HOSPITAL_BASED_OUTPATIENT_CLINIC_OR_DEPARTMENT_OTHER): Payer: BLUE CROSS/BLUE SHIELD

## 2016-01-24 ENCOUNTER — Encounter: Payer: Self-pay | Admitting: Internal Medicine

## 2016-01-24 ENCOUNTER — Ambulatory Visit (HOSPITAL_BASED_OUTPATIENT_CLINIC_OR_DEPARTMENT_OTHER): Payer: BLUE CROSS/BLUE SHIELD | Admitting: Internal Medicine

## 2016-01-24 ENCOUNTER — Telehealth: Payer: Self-pay | Admitting: Internal Medicine

## 2016-01-24 ENCOUNTER — Other Ambulatory Visit: Payer: Self-pay | Admitting: Internal Medicine

## 2016-01-24 VITALS — BP 151/63 | HR 73 | Temp 98.2°F | Resp 18 | Ht 67.0 in | Wt 257.1 lb

## 2016-01-24 DIAGNOSIS — C9000 Multiple myeloma not having achieved remission: Secondary | ICD-10-CM | POA: Diagnosis not present

## 2016-01-24 DIAGNOSIS — R197 Diarrhea, unspecified: Secondary | ICD-10-CM

## 2016-01-24 DIAGNOSIS — Z5111 Encounter for antineoplastic chemotherapy: Secondary | ICD-10-CM

## 2016-01-24 DIAGNOSIS — R530 Neoplastic (malignant) related fatigue: Secondary | ICD-10-CM | POA: Diagnosis not present

## 2016-01-24 LAB — COMPREHENSIVE METABOLIC PANEL
ALBUMIN: 3.9 g/dL (ref 3.5–5.0)
ALK PHOS: 119 U/L (ref 40–150)
ALT: 38 U/L (ref 0–55)
AST: 36 U/L — AB (ref 5–34)
Anion Gap: 12 mEq/L — ABNORMAL HIGH (ref 3–11)
BILIRUBIN TOTAL: 0.48 mg/dL (ref 0.20–1.20)
BUN: 16.5 mg/dL (ref 7.0–26.0)
CALCIUM: 10 mg/dL (ref 8.4–10.4)
CO2: 19 mEq/L — ABNORMAL LOW (ref 22–29)
Chloride: 104 mEq/L (ref 98–109)
Creatinine: 0.8 mg/dL (ref 0.6–1.1)
EGFR: 85 mL/min/{1.73_m2} — ABNORMAL LOW (ref >90)
Glucose: 159 mg/dl — ABNORMAL HIGH (ref 70–140)
POTASSIUM: 3.7 meq/L (ref 3.5–5.1)
Sodium: 135 mEq/L — ABNORMAL LOW (ref 136–145)
TOTAL PROTEIN: 7.6 g/dL (ref 6.4–8.3)

## 2016-01-24 LAB — CBC WITH DIFFERENTIAL/PLATELET
BASO%: 0.2 % (ref 0.0–2.0)
BASOS ABS: 0 10*3/uL (ref 0.0–0.1)
EOS ABS: 0 10*3/uL (ref 0.0–0.5)
EOS%: 0.1 % (ref 0.0–7.0)
HEMATOCRIT: 30.7 % — AB (ref 34.8–46.6)
HEMOGLOBIN: 10 g/dL — AB (ref 11.6–15.9)
LYMPH%: 12.6 % — ABNORMAL LOW (ref 14.0–49.7)
MCH: 26.5 pg (ref 25.1–34.0)
MCHC: 32.4 g/dL (ref 31.5–36.0)
MCV: 81.8 fL (ref 79.5–101.0)
MONO#: 0.7 10*3/uL (ref 0.1–0.9)
MONO%: 10.8 % (ref 0.0–14.0)
NEUT#: 5.1 10*3/uL (ref 1.5–6.5)
NEUT%: 76.3 % (ref 38.4–76.8)
Platelets: 253 10*3/uL (ref 145–400)
RBC: 3.75 10*6/uL (ref 3.70–5.45)
RDW: 16.9 % — AB (ref 11.2–14.5)
WBC: 6.6 10*3/uL (ref 3.9–10.3)
lymph#: 0.8 10*3/uL — ABNORMAL LOW (ref 0.9–3.3)

## 2016-01-24 LAB — LACTATE DEHYDROGENASE: LDH: 153 U/L (ref 125–245)

## 2016-01-24 MED ORDER — WARFARIN SODIUM 2 MG PO TABS: 2.0000 mg | ORAL_TABLET | Freq: Every day | ORAL | 0 refills | Status: DC

## 2016-01-24 NOTE — Progress Notes (Signed)
    [facility] [phone]   [phone]  OFFICE PROGRESS NOTE  KEEVER,RICHARD A, MD [address] [place] [address]  DIAGNOSIS: Plasma cell dyscrasia initially diagnosed as MGUS in September 2010, with additional symptoms suggestive of POEMS syndrome.   PRIOR THERAPY:  1) Velcade 1.3 MG/M2 subcutaneously with Decadron 40 mg by mouth on a weekly basis. First cycle 11/24/2013. She status post 31 weekly doses of treatment. 2) Velcade 1.3 MG/M2 subcutaneously and weekly basis with Decadron 40 mg by mouth weekly. First dose 02/01/2015. Status post 28 cycles.   CURRENT THERAPY: Revlimid 25 mg by mouth daily for 21 days every 4 weeks with weekly Decadron 20 mg. started in 11/27/2015. She started cycle #3 on 01/23/2016.   INTERVAL HISTORY: Brenda Conner 55 y.o. female came to the clinic today for follow-up visit. She is currently on treatment with Revlimid and Decadron. She is tolerating her treatment well. She continues to have mild fatigue but much better than before. She denied having any bleeding issues. She has no chest pain, shortness of breath, cough or hemoptysis. She has occasional diarrhea but much improved with Imodium. She lost around 7 pounds since her last visit. The patient denied having any other complaints. She had repeat blood work and she is here for evaluation and discussion of her lab results.  MEDICAL HISTORY: Past Medical History:  Diagnosis Date  . Acid reflux   . Anxiety   . Asthma   . Cancer (HCC)    waldenstroms/ macroglobinulemia  . Depression   . Depression   . Diabetes mellitus without complication (HCC)   . Hypercholesteremia   . Hypertension   . Hypothyroidism   . Macroglobulinemia (HCC)    ? POEMS syndrome  . Multiple myeloma (HCC)   . Obesity   . PONV (postoperative nausea and vomiting)   . Sleep apnea    CPAP at bedtime  . Sleep apnea     ALLERGIES:  is allergic to codeine; hydrocodone; lortab  [hydrocodone-acetaminophen]; onion; shellfish allergy; and amoxicillin.  MEDICATIONS:  Current Outpatient Prescriptions  Medication Sig Dispense Refill  . acyclovir (ZOVIRAX) 400 MG tablet Take 1 tablet (400 mg total) by mouth 2 (two) times daily. 180 tablet 0  . ALPRAZolam (XANAX) 0.5 MG tablet Take 1 tablet (0.5 mg total) by mouth at bedtime as needed for anxiety. 30 tablet 0  . aspirin 81 MG tablet Take 81 mg by mouth daily.    . Azelastine HCl 0.15 % SOLN as needed.    . B-D UF III MINI PEN NEEDLES 31G X 5 MM MISC     . Blood Glucose Monitoring Suppl (ONE TOUCH ULTRA MINI) W/DEVICE KIT See admin instructions. Reported on 01/25/2015  0  . Cetirizine HCl (ZYRTEC ALLERGY PO) Take by mouth daily.    . dexamethasone (DECADRON) 4 MG tablet 5 tablet by mouth every week. Start with the first dose of chemotherapy. 40 tablet 3  . DiphenhydrAMINE HCl (BENADRYL ALLERGY PO) Take by mouth as needed.    . EPIPEN 2-PAK 0.3 MG/0.3ML SOAJ injection Reported on 06/21/2015  1  . Fexofenadine HCl (ALLEGRA ALLERGY PO) Take by mouth daily.    . Fluticasone-Salmeterol (ADVAIR) 100-50 MCG/DOSE AEPB Inhale 2 puffs into the lungs every 12 (twelve) hours.    . ibuprofen (ADVIL,MOTRIN) 100 MG tablet Take 100 mg by mouth every 6 (six) hours as needed. Reported on 05/31/2015    . insulin glargine (LANTUS) 100 UNIT/ML injection Inject 8 Units   into the skin 2 (two) times a week. Reported on 02/18/2015    . levothyroxine (SYNTHROID, LEVOTHROID) 150 MCG tablet Take 150 mcg by mouth daily before breakfast.    . lisinopril (PRINIVIL,ZESTRIL) 10 MG tablet Take 10 mg by mouth daily.  3  . loperamide (IMODIUM) 2 MG capsule Take 2 mg by mouth as needed for diarrhea or loose stools.    . metFORMIN (GLUCOPHAGE) 1000 MG tablet Take 1,000 mg by mouth 2 (two) times daily.  3  . NOVOLOG FLEXPEN 100 UNIT/ML FlexPen 3 Units daily as needed. Reported on 01/25/2015  6  . omeprazole (PRILOSEC) 40 MG capsule Take 40 mg by mouth 2 (two) times  daily.     . ondansetron (ZOFRAN-ODT) 8 MG disintegrating tablet Take 8 mg by mouth every 8 (eight) hours as needed. Reported on 05/31/2015  2  . ONE TOUCH ULTRA TEST test strip See admin instructions. Reported on 01/25/2015  11  . pravastatin (PRAVACHOL) 40 MG tablet Take 40 mg by mouth every evening.     [name] PROAIR HFA 108 (90 Base) MCG/ACT inhaler Reported on 06/21/2015  1  . REVLIMID 25 MG capsule TAKE 1 CAPSULE (25MG) BY MOUTH ONCE DAILY 21 DAYS ON AND 7 DAYS OFF EVERY 4 WEEKS 21 capsule 1  . sertraline (ZOLOFT) 50 MG tablet Take 3 tablets (150 mg total) by mouth daily. 270 tablet 4  . verapamil (CALAN-SR) 240 MG CR tablet Take 240 mg by mouth 2 (two) times daily.    [name] warfarin (COUMADIN) 2 MG tablet Take 1 tablet (2 mg total) by mouth daily. 90 tablet 0   No current facility-administered medications for this visit.     SURGICAL HISTORY:  Past Surgical History:  Procedure Laterality Date  . ABLATION  09/2007   HTA and polyp resection  . BACK SURGERY  03/2004   herniation, L4-L5  . Bil Laprascopic knee surgery    . BONE MARROW BIOPSY     2011  . BONE MARROW BIOPSY  4/14  . CHOLECYSTECTOMY    . FOOT SURGERY  1998  . KNEE ARTHROSCOPY W/ MENISCAL REPAIR  10/10 ; 3/11  . LAPAROSCOPIC CHOLECYSTECTOMY  1997  . NASAL SINUS SURGERY  2004  . UTERINE FIBROID EMBOLIZATION      REVIEW OF SYSTEMS:  A comprehensive review of systems was negative except for: Constitutional: positive for weight loss Gastrointestinal: positive for diarrhea Neurological: positive for paresthesia   PHYSICAL EXAMINATION: General appearance: alert, cooperative, fatigued and no distress Head: Normocephalic, without obvious abnormality, atraumatic Neck: no adenopathy Lymph nodes: Cervical, supraclavicular, and axillary nodes normal. Resp: clear to auscultation bilaterally Back: symmetric, no curvature. ROM normal. No CVA tenderness. Cardio: regular rate and rhythm, S1, S2 normal, no murmur, click, rub or gallop GI:  soft, non-tender; bowel sounds normal; no masses,  no organomegaly Extremities: extremities normal, atraumatic, no cyanosis or edema  ECOG PERFORMANCE STATUS: 0 - Asymptomatic  Blood pressure (!) 151/63, pulse 73, temperature 98.2 F (36.8 C), temperature source Oral, resp. rate 18, height 5' 7" (1.702 m), weight 257 lb 1.6 oz (116.6 kg), SpO2 100 %.  LABORATORY DATA: Lab Results  Component Value Date   WBC 6.6 01/24/2016   HGB 10.0 (L) 01/24/2016   HCT 30.7 (L) 01/24/2016   MCV 81.8 01/24/2016   PLT 253 01/24/2016      Chemistry      Component Value Date/Time   NA 138 12/27/2015 0756   K 3.9 12/27/2015 0756   CL 104  04/07/2012 1415   CO2 22 12/27/2015 0756   BUN 13.4 12/27/2015 0756   CREATININE 0.8 12/27/2015 0756      Component Value Date/Time   CALCIUM 9.5 12/27/2015 0756   ALKPHOS 186 (H) 12/27/2015 0756   AST 70 (H) 12/27/2015 0756   ALT 89 (H) 12/27/2015 0756   BILITOT 0.72 12/27/2015 0756      ASSESSMENT AND PLAN:  This is a very pleasant 56 years old white female with multiple myeloma status post chemotherapy with Velcade and Decadron discontinued secondary to disease progression. She is currently undergoing treatment with Revlimid and Decadron status post 2 cycles. She is tolerating her treatment much better today. I recommended for her to continue her current treatment with Revlimid and Decadron as a scheduled. She will come back for follow-up visit in 4 weeks after repeating myeloma panel 43 evaluation of her disease. For the DVT prophylaxis, she will continue on Coumadin 2 mg by mouth daily. The patient was advised to call immediately if she has any concerning symptoms in the interval. All questions were answered. The patient knows to call the clinic with any problems, questions or concerns. We can certainly see the patient much sooner if necessary. I spent 10 minutes counseling the patient face to face. The total time spent in the appointment was 15  minutes. Disclaimer: This note was dictated with voice recognition software. Similar sounding words can inadvertently be transcribed and may not be corrected upon review.

## 2016-01-24 NOTE — Telephone Encounter (Signed)
Appointments scheduled per 1/16 LOS. Patient given AVS report and calendars with future scheduled appointments. Patient given AVS report and calendars with future scheduled appointments.

## 2016-01-28 DIAGNOSIS — M542 Cervicalgia: Secondary | ICD-10-CM | POA: Diagnosis not present

## 2016-01-28 DIAGNOSIS — M25511 Pain in right shoulder: Secondary | ICD-10-CM | POA: Diagnosis not present

## 2016-01-28 DIAGNOSIS — M5412 Radiculopathy, cervical region: Secondary | ICD-10-CM | POA: Diagnosis not present

## 2016-02-01 DIAGNOSIS — E039 Hypothyroidism, unspecified: Secondary | ICD-10-CM | POA: Diagnosis not present

## 2016-02-01 DIAGNOSIS — E7800 Pure hypercholesterolemia, unspecified: Secondary | ICD-10-CM | POA: Diagnosis not present

## 2016-02-01 DIAGNOSIS — E1165 Type 2 diabetes mellitus with hyperglycemia: Secondary | ICD-10-CM | POA: Diagnosis not present

## 2016-02-01 DIAGNOSIS — G609 Hereditary and idiopathic neuropathy, unspecified: Secondary | ICD-10-CM | POA: Diagnosis not present

## 2016-02-02 ENCOUNTER — Other Ambulatory Visit: Payer: Self-pay | Admitting: Internal Medicine

## 2016-02-04 DIAGNOSIS — M4003 Postural kyphosis, cervicothoracic region: Secondary | ICD-10-CM | POA: Diagnosis not present

## 2016-02-04 DIAGNOSIS — M5412 Radiculopathy, cervical region: Secondary | ICD-10-CM | POA: Diagnosis not present

## 2016-02-04 DIAGNOSIS — M542 Cervicalgia: Secondary | ICD-10-CM | POA: Diagnosis not present

## 2016-02-09 MED ORDER — REVLIMID 25 MG PO CAPS: ORAL_CAPSULE | ORAL | 3 refills | Status: DC

## 2016-02-09 NOTE — Addendum Note (Signed)
Addended by: Lucile Crater on: 02/09/2016 12:14 PM   Modules accepted: Orders

## 2016-02-09 NOTE — Telephone Encounter (Signed)
Revlimid resent to pt's pharmacy with auth#

## 2016-02-11 DIAGNOSIS — M4003 Postural kyphosis, cervicothoracic region: Secondary | ICD-10-CM | POA: Diagnosis not present

## 2016-02-11 DIAGNOSIS — M5412 Radiculopathy, cervical region: Secondary | ICD-10-CM | POA: Diagnosis not present

## 2016-02-11 DIAGNOSIS — M542 Cervicalgia: Secondary | ICD-10-CM | POA: Diagnosis not present

## 2016-02-18 DIAGNOSIS — M542 Cervicalgia: Secondary | ICD-10-CM | POA: Diagnosis not present

## 2016-02-18 DIAGNOSIS — M5412 Radiculopathy, cervical region: Secondary | ICD-10-CM | POA: Diagnosis not present

## 2016-02-18 DIAGNOSIS — M4003 Postural kyphosis, cervicothoracic region: Secondary | ICD-10-CM | POA: Diagnosis not present

## 2016-02-21 ENCOUNTER — Other Ambulatory Visit (HOSPITAL_BASED_OUTPATIENT_CLINIC_OR_DEPARTMENT_OTHER): Payer: BLUE CROSS/BLUE SHIELD

## 2016-02-21 DIAGNOSIS — G4733 Obstructive sleep apnea (adult) (pediatric): Secondary | ICD-10-CM | POA: Diagnosis not present

## 2016-02-21 DIAGNOSIS — C9000 Multiple myeloma not having achieved remission: Secondary | ICD-10-CM | POA: Diagnosis not present

## 2016-02-21 DIAGNOSIS — Z5111 Encounter for antineoplastic chemotherapy: Secondary | ICD-10-CM | POA: Diagnosis not present

## 2016-02-21 LAB — CBC WITH DIFFERENTIAL/PLATELET
BASO%: 0.6 % (ref 0.0–2.0)
Basophils Absolute: 0 10*3/uL (ref 0.0–0.1)
EOS%: 0 % (ref 0.0–7.0)
Eosinophils Absolute: 0 10*3/uL (ref 0.0–0.5)
HCT: 29.5 % — ABNORMAL LOW (ref 34.8–46.6)
HEMOGLOBIN: 9.8 g/dL — AB (ref 11.6–15.9)
LYMPH%: 9.3 % — AB (ref 14.0–49.7)
MCH: 27.3 pg (ref 25.1–34.0)
MCHC: 33.3 g/dL (ref 31.5–36.0)
MCV: 82 fL (ref 79.5–101.0)
MONO#: 0.6 10*3/uL (ref 0.1–0.9)
MONO%: 10.7 % (ref 0.0–14.0)
NEUT%: 79.4 % — ABNORMAL HIGH (ref 38.4–76.8)
NEUTROS ABS: 4.6 10*3/uL (ref 1.5–6.5)
Platelets: 197 10*3/uL (ref 145–400)
RBC: 3.59 10*6/uL — AB (ref 3.70–5.45)
RDW: 18.7 % — AB (ref 11.2–14.5)
WBC: 5.9 10*3/uL (ref 3.9–10.3)
lymph#: 0.5 10*3/uL — ABNORMAL LOW (ref 0.9–3.3)

## 2016-02-21 LAB — COMPREHENSIVE METABOLIC PANEL
ALBUMIN: 3.9 g/dL (ref 3.5–5.0)
ALK PHOS: 105 U/L (ref 40–150)
ALT: 33 U/L (ref 0–55)
AST: 26 U/L (ref 5–34)
Anion Gap: 13 mEq/L — ABNORMAL HIGH (ref 3–11)
BUN: 13.7 mg/dL (ref 7.0–26.0)
CO2: 19 meq/L — AB (ref 22–29)
Calcium: 10 mg/dL (ref 8.4–10.4)
Chloride: 105 mEq/L (ref 98–109)
Creatinine: 0.7 mg/dL (ref 0.6–1.1)
EGFR: >90 mL/min/{1.73_m2} (ref >90)
GLUCOSE: 134 mg/dL (ref 70–140)
POTASSIUM: 3.9 meq/L (ref 3.5–5.1)
SODIUM: 137 meq/L (ref 136–145)
TOTAL PROTEIN: 7.1 g/dL (ref 6.4–8.3)
Total Bilirubin: 0.51 mg/dL (ref 0.20–1.20)

## 2016-02-21 LAB — LACTATE DEHYDROGENASE: LDH: 114 U/L — AB (ref 125–245)

## 2016-02-22 LAB — IGG, IGA, IGM
IgA, Qn, Serum: 94 mg/dL (ref 87–352)
IgG, Qn, Serum: 427 mg/dL — ABNORMAL LOW (ref 700–1600)
IgM, Qn, Serum: 846 mg/dL — ABNORMAL HIGH (ref 26–217)

## 2016-02-22 LAB — BETA 2 MICROGLOBULIN, SERUM: Beta-2: 1.2 mg/L (ref 0.6–2.4)

## 2016-02-22 LAB — KAPPA/LAMBDA LIGHT CHAINS
IG LAMBDA FREE LIGHT CHAIN: 999.5 mg/L — AB (ref 5.7–26.3)
Ig Kappa Free Light Chain: 7.8 mg/L (ref 3.3–19.4)
KAPPA/LAMBDA FLC RATIO: 0.01 — AB (ref 0.26–1.65)

## 2016-02-23 DIAGNOSIS — J301 Allergic rhinitis due to pollen: Secondary | ICD-10-CM | POA: Diagnosis not present

## 2016-02-24 DIAGNOSIS — J3081 Allergic rhinitis due to animal (cat) (dog) hair and dander: Secondary | ICD-10-CM | POA: Diagnosis not present

## 2016-02-24 DIAGNOSIS — J3089 Other allergic rhinitis: Secondary | ICD-10-CM | POA: Diagnosis not present

## 2016-02-28 ENCOUNTER — Encounter: Payer: Self-pay | Admitting: Internal Medicine

## 2016-02-28 ENCOUNTER — Ambulatory Visit (HOSPITAL_BASED_OUTPATIENT_CLINIC_OR_DEPARTMENT_OTHER): Payer: BLUE CROSS/BLUE SHIELD | Admitting: Internal Medicine

## 2016-02-28 ENCOUNTER — Telehealth: Payer: Self-pay | Admitting: Internal Medicine

## 2016-02-28 ENCOUNTER — Ambulatory Visit (HOSPITAL_BASED_OUTPATIENT_CLINIC_OR_DEPARTMENT_OTHER): Payer: BLUE CROSS/BLUE SHIELD

## 2016-02-28 VITALS — BP 136/66 | HR 62 | Temp 98.1°F | Resp 18 | Ht 67.0 in | Wt 253.6 lb

## 2016-02-28 DIAGNOSIS — R197 Diarrhea, unspecified: Secondary | ICD-10-CM | POA: Diagnosis not present

## 2016-02-28 DIAGNOSIS — R300 Dysuria: Secondary | ICD-10-CM | POA: Diagnosis not present

## 2016-02-28 DIAGNOSIS — Z5111 Encounter for antineoplastic chemotherapy: Secondary | ICD-10-CM

## 2016-02-28 DIAGNOSIS — D630 Anemia in neoplastic disease: Secondary | ICD-10-CM | POA: Diagnosis not present

## 2016-02-28 DIAGNOSIS — C9000 Multiple myeloma not having achieved remission: Secondary | ICD-10-CM | POA: Diagnosis not present

## 2016-02-28 DIAGNOSIS — Z7901 Long term (current) use of anticoagulants: Secondary | ICD-10-CM

## 2016-02-28 HISTORY — DX: Dysuria: R30.0

## 2016-02-28 LAB — URINALYSIS, MICROSCOPIC - CHCC
BLOOD: NEGATIVE
Bilirubin (Urine): NEGATIVE
GLUCOSE UR CHCC: NEGATIVE mg/dL
KETONES: NEGATIVE mg/dL
Nitrite: NEGATIVE
PROTEIN: NEGATIVE mg/dL
RBC / HPF: NEGATIVE (ref 0–2)
SPECIFIC GRAVITY, URINE: 1.01 (ref 1.003–1.035)
UROBILINOGEN UR: 0.2 mg/dL (ref 0.2–1)
pH: 6 (ref 4.6–8.0)

## 2016-02-28 LAB — CBC WITH DIFFERENTIAL/PLATELET
BASO%: 0.2 % (ref 0.0–2.0)
BASOS ABS: 0 10*3/uL (ref 0.0–0.1)
EOS ABS: 0 10*3/uL (ref 0.0–0.5)
EOS%: 0.2 % (ref 0.0–7.0)
HEMATOCRIT: 30.5 % — AB (ref 34.8–46.6)
HEMOGLOBIN: 9.7 g/dL — AB (ref 11.6–15.9)
LYMPH%: 8.1 % — ABNORMAL LOW (ref 14.0–49.7)
MCH: 26.2 pg (ref 25.1–34.0)
MCHC: 31.8 g/dL (ref 31.5–36.0)
MCV: 82.4 fL (ref 79.5–101.0)
MONO#: 0.4 10*3/uL (ref 0.1–0.9)
MONO%: 6.3 % (ref 0.0–14.0)
NEUT#: 5.2 10*3/uL (ref 1.5–6.5)
NEUT%: 85.2 % — AB (ref 38.4–76.8)
Platelets: 162 10*3/uL (ref 145–400)
RBC: 3.7 10*6/uL (ref 3.70–5.45)
RDW: 16.9 % — AB (ref 11.2–14.5)
WBC: 6.1 10*3/uL (ref 3.9–10.3)
lymph#: 0.5 10*3/uL — ABNORMAL LOW (ref 0.9–3.3)

## 2016-02-28 LAB — COMPREHENSIVE METABOLIC PANEL
ALBUMIN: 3.8 g/dL (ref 3.5–5.0)
ALT: 49 U/L (ref 0–55)
AST: 31 U/L (ref 5–34)
Alkaline Phosphatase: 110 U/L (ref 40–150)
Anion Gap: 12 mEq/L — ABNORMAL HIGH (ref 3–11)
BUN: 13 mg/dL (ref 7.0–26.0)
CALCIUM: 10 mg/dL (ref 8.4–10.4)
CHLORIDE: 105 meq/L (ref 98–109)
CO2: 20 mEq/L — ABNORMAL LOW (ref 22–29)
Creatinine: 0.7 mg/dL (ref 0.6–1.1)
Glucose: 137 mg/dl (ref 70–140)
POTASSIUM: 3.8 meq/L (ref 3.5–5.1)
Sodium: 137 mEq/L (ref 136–145)
Total Bilirubin: 0.57 mg/dL (ref 0.20–1.20)
Total Protein: 7.1 g/dL (ref 6.4–8.3)

## 2016-02-28 MED ORDER — ACYCLOVIR 400 MG PO TABS: 400.0000 mg | ORAL_TABLET | Freq: Two times a day (BID) | ORAL | 0 refills | Status: DC

## 2016-02-28 NOTE — Telephone Encounter (Signed)
Chemo appointment request, submitted to Audie Clear to be added to schedule. Appointments scheduled per 02/28/16 los. Patient will review updated appointment schedule via MyChart. Patient was given a copy of the AVS report and appointment schedule per 02/28/16 los,

## 2016-02-28 NOTE — Progress Notes (Signed)
[facility] [phone]   [phone]  OFFICE PROGRESS NOTE  Brenda Kern, MD [place] [address]  DIAGNOSIS: Plasma cell dyscrasia initially diagnosed as MGUS in September 2010, with additional symptoms suggestive of POEMS syndrome.   PRIOR THERAPY:  1) Velcade 1.3 MG/M2 subcutaneously with Decadron 40 mg by mouth on a weekly basis. First cycle 11/24/2013. She status post 31 weekly doses of treatment. 2) Velcade 1.3 MG/M2 subcutaneously and weekly basis with Decadron 40 mg by mouth weekly. First dose 02/01/2015. Status post 28 cycles. 3) Revlimid 25 mg by mouth daily for 21 days every 4 weeks with weekly Decadron 20 mg. started in 11/27/2015. Status post 3 cycles discontinued secondary to lack of response.  CURRENT THERAPY: Systemic treatment with Velcade 1.3 MG/KG weekly, Revlimid 25 mg by mouth daily for 21 days every 4 weeks in addition to Decadron 20 mg by mouth weekly. First dose 03/06/2016.  INTERVAL HISTORY: Brenda Conner 56 y.o. female came to the clinic today for follow-up visit. The patient is feeling fine today with no specific complaints except for mild fatigue and few episodes of diarrhea. Her diarrhea improved with Imodium. She denied having any chest pain, shortness of breath, cough or hemoptysis. She has no fever or chills. She denied having any weight loss or night sweats. She has mild dysuria started few days ago. She has been tolerating her treatment with Revlimid fairly well except for the diarrhea. She had repeat myeloma panel performed recently and she is here for evaluation and discussion of her lab results and treatment options.   MEDICAL HISTORY: Past Medical History:  Diagnosis Date  . Acid reflux   . Anxiety   . Asthma   . Cancer [facility])    waldenstroms/ macroglobinulemia  . Depression   . Depression   . Diabetes mellitus without complication ([place])   . Hypercholesteremia   . Hypertension   .  Hypothyroidism   . Macroglobulinemia ([place])    ? POEMS syndrome  . Multiple myeloma ([place])   . Obesity   . PONV (postoperative nausea and vomiting)   . Sleep apnea    CPAP at bedtime  . Sleep apnea     ALLERGIES:  is allergic to codeine; hydrocodone; lortab [hydrocodone-acetaminophen]; onion; shellfish allergy; and amoxicillin.  MEDICATIONS:  Current Outpatient Prescriptions  Medication Sig Dispense Refill  . acyclovir (ZOVIRAX) 400 MG tablet Take 1 tablet (400 mg total) by mouth 2 (two) times daily. 180 tablet 0  . ALPRAZolam (XANAX) 0.5 MG tablet Take 1 tablet (0.5 mg total) by mouth at bedtime as needed for anxiety. 30 tablet 0  . aspirin 81 MG tablet Take 81 mg by mouth daily.    . Azelastine HCl 0.15 % SOLN as needed.    . B-D UF III MINI PEN NEEDLES 31G X 5 MM MISC     . Blood Glucose Monitoring Suppl (ONE TOUCH ULTRA MINI) W/DEVICE KIT See admin instructions. Reported on 01/25/2015  0  . Cetirizine HCl (ZYRTEC ALLERGY PO) Take by mouth daily.    [name] dexamethasone (DECADRON) 4 MG tablet 5 tablet by mouth every week. Start with the first dose of chemotherapy. 40 tablet 3  . DiphenhydrAMINE HCl (BENADRYL ALLERGY PO) Take by mouth as needed.    [name] EPIPEN 2-PAK 0.3 MG/0.3ML SOAJ injection Reported on 06/21/2015  1  . Fexofenadine HCl (ALLEGRA ALLERGY PO) Take by mouth daily.    . Fluticasone-Salmeterol (ADVAIR) 100-50 MCG/DOSE AEPB Inhale  2 puffs into the lungs every 12 (twelve) hours.    [name] ibuprofen (ADVIL,MOTRIN) 100 MG tablet Take 100 mg by mouth every 6 (six) hours as needed. Reported on 05/31/2015    . insulin glargine (LANTUS) 100 UNIT/ML injection Inject 8 Units into the skin 2 (two) times a week. Reported on 02/18/2015    . levothyroxine (SYNTHROID, LEVOTHROID) 150 MCG tablet Take 150 mcg by mouth daily before breakfast.    . lisinopril (PRINIVIL,ZESTRIL) 10 MG tablet Take 10 mg by mouth daily.  3  . loperamide (IMODIUM) 2 MG capsule Take 2 mg by mouth as needed for diarrhea or loose  stools.    . metFORMIN (GLUCOPHAGE) 1000 MG tablet Take 1,000 mg by mouth 2 (two) times daily.  3  . NOVOLOG FLEXPEN 100 UNIT/ML FlexPen 3 Units daily as needed. Reported on 01/25/2015  6  . omeprazole (PRILOSEC) 40 MG capsule Take 40 mg by mouth 2 (two) times daily.     . ondansetron (ZOFRAN-ODT) 8 MG disintegrating tablet Take 8 mg by mouth every 8 (eight) hours as needed. Reported on 05/31/2015  2  . ONE TOUCH ULTRA TEST test strip See admin instructions. Reported on 01/25/2015  11  . pravastatin (PRAVACHOL) 40 MG tablet Take 40 mg by mouth every evening.     [name] PROAIR HFA 108 (90 Base) MCG/ACT inhaler Reported on 06/21/2015  1  . REVLIMID 25 MG capsule TAKE 1 CAPSULE (25MG) BY MOUTH ONCE DAILY 21 DAYS ON AND 7 DAYS OFF EVERY 4 WEEKS  Auth# [id number] 21 capsule 3  . sertraline (ZOLOFT) 50 MG tablet Take 3 tablets (150 mg total) by mouth daily. 270 tablet 4  . verapamil (CALAN-SR) 240 MG CR tablet Take 240 mg by mouth 2 (two) times daily.    [name] warfarin (COUMADIN) 2 MG tablet Take 1 tablet (2 mg total) by mouth daily. 90 tablet 0   No current facility-administered medications for this visit.     SURGICAL HISTORY:  Past Surgical History:  Procedure Laterality Date  . ABLATION  09/2007   HTA and polyp resection  . BACK SURGERY  03/2004   herniation, L4-L5  . Bil Laprascopic knee surgery    . BONE MARROW BIOPSY     2011  . BONE MARROW BIOPSY  4/14  . CHOLECYSTECTOMY    . FOOT SURGERY  1998  . KNEE ARTHROSCOPY W/ MENISCAL REPAIR  10/10 ; 3/11  . LAPAROSCOPIC CHOLECYSTECTOMY  1997  . NASAL SINUS SURGERY  2004  . UTERINE FIBROID EMBOLIZATION      REVIEW OF SYSTEMS:  Constitutional: positive for fatigue Eyes: negative Ears, nose, mouth, throat, and face: negative Respiratory: negative Cardiovascular: negative Gastrointestinal: negative Genitourinary:positive for dysuria Integument/breast: negative Hematologic/lymphatic: negative Musculoskeletal:negative Neurological:  negative Behavioral/Psych: negative Endocrine: negative Allergic/Immunologic: negative   PHYSICAL EXAMINATION: General appearance: alert, cooperative, fatigued and no distress Head: Normocephalic, without obvious abnormality, atraumatic Neck: no adenopathy Lymph nodes: Cervical, supraclavicular, and axillary nodes normal. Resp: clear to auscultation bilaterally Back: symmetric, no curvature. ROM normal. No CVA tenderness. Cardio: regular rate and rhythm, S1, S2 normal, no murmur, click, rub or gallop GI: soft, non-tender; bowel sounds normal; no masses,  no organomegaly Extremities: extremities normal, atraumatic, no cyanosis or edema Neurologic: Alert and oriented X 3, normal strength and tone. Normal symmetric reflexes. Normal coordination and gait  ECOG PERFORMANCE STATUS: 1 - Symptomatic but completely ambulatory  Blood pressure 136/66, pulse 62, temperature 98.1 F (36.7 C), temperature source Oral, resp. rate 18, height  5' 7"  (1.702 m), weight 253 lb 9.6 oz (115 kg), SpO2 99 %.  LABORATORY DATA: Lab Results  Component Value Date   WBC 5.9 02/21/2016   HGB 9.8 (L) 02/21/2016   HCT 29.5 (L) 02/21/2016   MCV 82.0 02/21/2016   PLT 197 02/21/2016      Chemistry      Component Value Date/Time   NA 137 02/21/2016 0848   K 3.9 02/21/2016 0848   CL 104 04/07/2012 1415   CO2 19 (L) 02/21/2016 0848   BUN 13.7 02/21/2016 0848   CREATININE 0.7 02/21/2016 0848      Component Value Date/Time   CALCIUM 10.0 02/21/2016 0848   ALKPHOS 105 02/21/2016 0848   AST 26 02/21/2016 0848   ALT 33 02/21/2016 0848   BILITOT 0.51 02/21/2016 0848      ASSESSMENT AND PLAN:  This is a very pleasant 56 years old white female with multiple myeloma status post previous treatment with Velcade and Decadron discontinued secondary to disease progression. The patient was then treated with Revlimid and Decadron but she has lack of response to this regimen. Her myeloma panel showed stable disease at  this point. I had a lengthy discussion with the patient today about her condition and further treatment options. I recommended for the patient treatment with a combination of subcutaneous Velcade weekly, Revlimid and Decadron. I discussed with the patient the goals of care and she understands that this treatment is of palliative nature. I discussed with her the adverse effect of this treatment. She would like to proceed with treatment as planned and she is expected to start the first cycle of this treatment next week. For the anemia of neoplastic disease, we'll continue to monitor her hemoglobin and hematocrit closely and consider the patient for transfusion if needed. For the diarrhea she will continue on Imodium as needed. For the dysuria, I will arrange for the patient to have a urinalysis today and we'll consider her for treatment if she has urinary tract infection. For DVT prophylaxis, she will continue on Coumadin 2 mg by mouth daily and for CMV prophylaxis, I will start the patient on acyclovir 400 mg by mouth twice a day. The patient would come back for follow-up visit in 3 weeks for evaluation and management of any adverse effect of her treatment. All questions were answered. The patient knows to call the clinic with any problems, questions or concerns. We can certainly see the patient much sooner if necessary.  Disclaimer: This note was dictated with voice recognition software. Similar sounding words can inadvertently be transcribed and may not be corrected upon review.

## 2016-02-28 NOTE — Progress Notes (Signed)
START ON PATHWAY REGIMEN - Multiple Myeloma  [id number]: Vd (Bortezomib 1.3 mg/m2 Subcut D1, 8, 15 + Dexamethasone 40 mg) q21 Days Until Progression or Unacceptable Toxicity    A cycle is every 21 days:     Bortezomib (Velcade(R)) 1.3 mg/m2 subcut once daily on days 1, 8, and 15       Dose Mod: None     Dexamethasone (Decadron(R)) 40 mg orally once daily on days 1, 8, and 15       Dose Mod: None         Additional Orders: Herpes zoster prophylaxis recommended.  **Always confirm dose/schedule in your pharmacy ordering system**    Patient Characteristics: Relapsed / Refractory, All Lines of Therapy R-ISS Staging: II Disease Classification: Relapsed Line of Therapy: Second Line  Intent of Therapy: Non-Curative / Palliative Intent, Discussed with Patient

## 2016-02-28 NOTE — Progress Notes (Signed)
Patient on plan of care prior to pathways. 

## 2016-03-01 ENCOUNTER — Telehealth: Payer: Self-pay | Admitting: Internal Medicine

## 2016-03-01 NOTE — Telephone Encounter (Signed)
Appointments scheduled per 03/01/16 los. Labs, chemo and follow up with Dr Julien Nordmann, was scheduled every 3 weeks, per 03/01/16 los.  Patient was given a copy of the AVS report and appointment schedule, per 03/01/16 los.

## 2016-03-02 ENCOUNTER — Other Ambulatory Visit: Payer: Self-pay | Admitting: Internal Medicine

## 2016-03-06 ENCOUNTER — Other Ambulatory Visit (HOSPITAL_BASED_OUTPATIENT_CLINIC_OR_DEPARTMENT_OTHER): Payer: BLUE CROSS/BLUE SHIELD

## 2016-03-06 ENCOUNTER — Ambulatory Visit (HOSPITAL_BASED_OUTPATIENT_CLINIC_OR_DEPARTMENT_OTHER): Payer: BLUE CROSS/BLUE SHIELD

## 2016-03-06 VITALS — BP 145/61 | HR 58 | Temp 97.6°F | Resp 17

## 2016-03-06 DIAGNOSIS — C9000 Multiple myeloma not having achieved remission: Secondary | ICD-10-CM

## 2016-03-06 DIAGNOSIS — Z5112 Encounter for antineoplastic immunotherapy: Secondary | ICD-10-CM | POA: Diagnosis not present

## 2016-03-06 LAB — COMPREHENSIVE METABOLIC PANEL
ALBUMIN: 3.5 g/dL (ref 3.5–5.0)
ALK PHOS: 119 U/L (ref 40–150)
ALT: 35 U/L (ref 0–55)
ANION GAP: 10 meq/L (ref 3–11)
AST: 35 U/L — ABNORMAL HIGH (ref 5–34)
BILIRUBIN TOTAL: 0.48 mg/dL (ref 0.20–1.20)
BUN: 12 mg/dL (ref 7.0–26.0)
CALCIUM: 9.2 mg/dL (ref 8.4–10.4)
CO2: 25 mEq/L (ref 22–29)
Chloride: 106 mEq/L (ref 98–109)
Creatinine: 0.7 mg/dL (ref 0.6–1.1)
Glucose: 126 mg/dl (ref 70–140)
POTASSIUM: 3.6 meq/L (ref 3.5–5.1)
Sodium: 140 mEq/L (ref 136–145)
Total Protein: 6.5 g/dL (ref 6.4–8.3)

## 2016-03-06 LAB — CBC WITH DIFFERENTIAL/PLATELET
BASO%: 1.1 % (ref 0.0–2.0)
BASOS ABS: 0 10*3/uL (ref 0.0–0.1)
EOS ABS: 0.2 10*3/uL (ref 0.0–0.5)
EOS%: 4.5 % (ref 0.0–7.0)
HEMATOCRIT: 29.8 % — AB (ref 34.8–46.6)
HEMOGLOBIN: 9.7 g/dL — AB (ref 11.6–15.9)
LYMPH%: 21.1 % (ref 14.0–49.7)
MCH: 26.8 pg (ref 25.1–34.0)
MCHC: 32.4 g/dL (ref 31.5–36.0)
MCV: 82.5 fL (ref 79.5–101.0)
MONO#: 0.5 10*3/uL (ref 0.1–0.9)
MONO%: 12.6 % (ref 0.0–14.0)
NEUT#: 2.4 10*3/uL (ref 1.5–6.5)
NEUT%: 60.7 % (ref 38.4–76.8)
PLATELETS: 128 10*3/uL — AB (ref 145–400)
RBC: 3.62 10*6/uL — ABNORMAL LOW (ref 3.70–5.45)
RDW: 19 % — AB (ref 11.2–14.5)
WBC: 3.9 10*3/uL (ref 3.9–10.3)
lymph#: 0.8 10*3/uL — ABNORMAL LOW (ref 0.9–3.3)

## 2016-03-06 MED ORDER — BORTEZOMIB CHEMO SQ INJECTION 3.5 MG (2.5MG/ML)
1.3000 mg/m2 | Freq: Once | INTRAMUSCULAR | Status: AC
  Administered 2016-03-06: 3 mg via SUBCUTANEOUS
  Filled 2016-03-06: qty 3

## 2016-03-06 MED ORDER — PROCHLORPERAZINE MALEATE 10 MG PO TABS
10.0000 mg | ORAL_TABLET | Freq: Once | ORAL | Status: AC
  Administered 2016-03-06: 10 mg via ORAL

## 2016-03-06 MED ORDER — PROCHLORPERAZINE MALEATE 10 MG PO TABS
ORAL_TABLET | ORAL | Status: AC
  Filled 2016-03-06: qty 1

## 2016-03-06 NOTE — Patient Instructions (Signed)
[facility] Discharge Instructions for Patients Receiving Chemotherapy  Today you received the following chemotherapy agents Velcade. To help prevent nausea and vomiting after your treatment, we encourage you to take your nausea medication as directed.  If you develop nausea and vomiting that is not controlled by your nausea medication, call the clinic.   BELOW ARE SYMPTOMS THAT SHOULD BE REPORTED IMMEDIATELY:  *FEVER GREATER THAN 100.5 F  *CHILLS WITH OR WITHOUT FEVER  NAUSEA AND VOMITING THAT IS NOT CONTROLLED WITH YOUR NAUSEA MEDICATION  *UNUSUAL SHORTNESS OF BREATH  *UNUSUAL BRUISING OR BLEEDING  TENDERNESS IN MOUTH AND THROAT WITH OR WITHOUT PRESENCE OF ULCERS  *URINARY PROBLEMS  *BOWEL PROBLEMS  UNUSUAL RASH Items with * indicate a potential emergency and should be followed up as soon as possible.  Feel free to call the clinic you have any questions or concerns. The clinic phone number is [phone].  Please show the CHEMO ALERT CARD at check-in to the Emergency Department and triage nurse.    

## 2016-03-08 ENCOUNTER — Encounter: Payer: Self-pay | Admitting: Internal Medicine

## 2016-03-09 ENCOUNTER — Other Ambulatory Visit: Payer: Self-pay | Admitting: Medical Oncology

## 2016-03-09 DIAGNOSIS — R110 Nausea: Secondary | ICD-10-CM

## 2016-03-09 MED ORDER — ONDANSETRON 8 MG PO TBDP: 8.0000 mg | ORAL_TABLET | Freq: Three times a day (TID) | ORAL | 2 refills | Status: DC | PRN

## 2016-03-12 ENCOUNTER — Other Ambulatory Visit: Payer: Self-pay | Admitting: Medical Oncology

## 2016-03-12 DIAGNOSIS — C9000 Multiple myeloma not having achieved remission: Secondary | ICD-10-CM

## 2016-03-12 MED ORDER — REVLIMID 25 MG PO CAPS: ORAL_CAPSULE | ORAL | 0 refills | Status: DC

## 2016-03-13 ENCOUNTER — Ambulatory Visit (HOSPITAL_BASED_OUTPATIENT_CLINIC_OR_DEPARTMENT_OTHER): Payer: BLUE CROSS/BLUE SHIELD

## 2016-03-13 ENCOUNTER — Other Ambulatory Visit: Payer: Self-pay | Admitting: Medical Oncology

## 2016-03-13 ENCOUNTER — Other Ambulatory Visit: Payer: BLUE CROSS/BLUE SHIELD

## 2016-03-13 ENCOUNTER — Other Ambulatory Visit (HOSPITAL_BASED_OUTPATIENT_CLINIC_OR_DEPARTMENT_OTHER): Payer: BLUE CROSS/BLUE SHIELD

## 2016-03-13 VITALS — BP 124/71 | HR 57 | Temp 97.9°F

## 2016-03-13 DIAGNOSIS — Z5112 Encounter for antineoplastic immunotherapy: Secondary | ICD-10-CM

## 2016-03-13 DIAGNOSIS — C9000 Multiple myeloma not having achieved remission: Secondary | ICD-10-CM

## 2016-03-13 LAB — CBC WITH DIFFERENTIAL/PLATELET
BASO%: 1.2 % (ref 0.0–2.0)
Basophils Absolute: 0 10*3/uL (ref 0.0–0.1)
EOS ABS: 0.2 10*3/uL (ref 0.0–0.5)
EOS%: 6 % (ref 0.0–7.0)
HCT: 31.7 % — ABNORMAL LOW (ref 34.8–46.6)
HEMOGLOBIN: 10.3 g/dL — AB (ref 11.6–15.9)
LYMPH%: 24.7 % (ref 14.0–49.7)
MCH: 26.7 pg (ref 25.1–34.0)
MCHC: 32.5 g/dL (ref 31.5–36.0)
MCV: 82.4 fL (ref 79.5–101.0)
MONO#: 0.4 10*3/uL (ref 0.1–0.9)
MONO%: 10.6 % (ref 0.0–14.0)
NEUT%: 57.5 % (ref 38.4–76.8)
NEUTROS ABS: 2 10*3/uL (ref 1.5–6.5)
Platelets: 145 10*3/uL (ref 145–400)
RBC: 3.85 10*6/uL (ref 3.70–5.45)
RDW: 19.6 % — AB (ref 11.2–14.5)
WBC: 3.4 10*3/uL — AB (ref 3.9–10.3)
lymph#: 0.9 10*3/uL (ref 0.9–3.3)

## 2016-03-13 LAB — COMPREHENSIVE METABOLIC PANEL
ALBUMIN: 3.7 g/dL (ref 3.5–5.0)
ALK PHOS: 114 U/L (ref 40–150)
ALT: 41 U/L (ref 0–55)
ANION GAP: 10 meq/L (ref 3–11)
AST: 35 U/L — ABNORMAL HIGH (ref 5–34)
BILIRUBIN TOTAL: 0.65 mg/dL (ref 0.20–1.20)
BUN: 11.2 mg/dL (ref 7.0–26.0)
CO2: 24 mEq/L (ref 22–29)
Calcium: 9.5 mg/dL (ref 8.4–10.4)
Chloride: 104 mEq/L (ref 98–109)
Creatinine: 0.8 mg/dL (ref 0.6–1.1)
EGFR: 89 mL/min/{1.73_m2} — AB (ref >90)
Glucose: 119 mg/dl (ref 70–140)
POTASSIUM: 4 meq/L (ref 3.5–5.1)
SODIUM: 138 meq/L (ref 136–145)
TOTAL PROTEIN: 6.9 g/dL (ref 6.4–8.3)

## 2016-03-13 MED ORDER — BORTEZOMIB CHEMO SQ INJECTION 3.5 MG (2.5MG/ML)
1.3000 mg/m2 | Freq: Once | INTRAMUSCULAR | Status: AC
  Administered 2016-03-13: 3 mg via SUBCUTANEOUS
  Filled 2016-03-13: qty 3

## 2016-03-13 MED ORDER — ONDANSETRON HCL 8 MG PO TABS
ORAL_TABLET | ORAL | Status: AC
  Filled 2016-03-13: qty 1

## 2016-03-13 MED ORDER — ONDANSETRON HCL 8 MG PO TABS
8.0000 mg | ORAL_TABLET | Freq: Once | ORAL | Status: AC
  Administered 2016-03-13: 8 mg via ORAL

## 2016-03-13 NOTE — Patient Instructions (Signed)
[facility] Discharge Instructions for Patients Receiving Chemotherapy  Today you received the following chemotherapy agents VELCADE To help prevent nausea and vomiting after your treatment, we encourage you to take your nausea medication AS PRESCRIBED.  If you develop nausea and vomiting that is not controlled by your nausea medication, call the clinic.   BELOW ARE SYMPTOMS THAT SHOULD BE REPORTED IMMEDIATELY:  *FEVER GREATER THAN 100.5 F  *CHILLS WITH OR WITHOUT FEVER  NAUSEA AND VOMITING THAT IS NOT CONTROLLED WITH YOUR NAUSEA MEDICATION  *UNUSUAL SHORTNESS OF BREATH  *UNUSUAL BRUISING OR BLEEDING  TENDERNESS IN MOUTH AND THROAT WITH OR WITHOUT PRESENCE OF ULCERS  *URINARY PROBLEMS  *BOWEL PROBLEMS  UNUSUAL RASH Items with * indicate a potential emergency and should be followed up as soon as possible.  Feel free to call the clinic you have any questions or concerns. The clinic phone number is [phone].  Please show the CHEMO ALERT CARD at check-in to the Emergency Department and triage nurse.   

## 2016-03-17 DIAGNOSIS — M4003 Postural kyphosis, cervicothoracic region: Secondary | ICD-10-CM | POA: Diagnosis not present

## 2016-03-17 DIAGNOSIS — M542 Cervicalgia: Secondary | ICD-10-CM | POA: Diagnosis not present

## 2016-03-17 DIAGNOSIS — M5412 Radiculopathy, cervical region: Secondary | ICD-10-CM | POA: Diagnosis not present

## 2016-03-20 ENCOUNTER — Ambulatory Visit (HOSPITAL_BASED_OUTPATIENT_CLINIC_OR_DEPARTMENT_OTHER): Payer: BLUE CROSS/BLUE SHIELD

## 2016-03-20 ENCOUNTER — Encounter: Payer: Self-pay | Admitting: Internal Medicine

## 2016-03-20 ENCOUNTER — Ambulatory Visit (HOSPITAL_BASED_OUTPATIENT_CLINIC_OR_DEPARTMENT_OTHER): Payer: BLUE CROSS/BLUE SHIELD | Admitting: Internal Medicine

## 2016-03-20 ENCOUNTER — Other Ambulatory Visit (HOSPITAL_BASED_OUTPATIENT_CLINIC_OR_DEPARTMENT_OTHER): Payer: BLUE CROSS/BLUE SHIELD

## 2016-03-20 VITALS — BP 124/79 | HR 66 | Temp 98.0°F | Resp 18 | Ht 67.0 in | Wt 253.8 lb

## 2016-03-20 DIAGNOSIS — C9000 Multiple myeloma not having achieved remission: Secondary | ICD-10-CM

## 2016-03-20 DIAGNOSIS — Z5112 Encounter for antineoplastic immunotherapy: Secondary | ICD-10-CM | POA: Diagnosis not present

## 2016-03-20 DIAGNOSIS — Z5111 Encounter for antineoplastic chemotherapy: Secondary | ICD-10-CM

## 2016-03-20 LAB — CBC WITH DIFFERENTIAL/PLATELET
BASO%: 2.2 % — ABNORMAL HIGH (ref 0.0–2.0)
BASOS ABS: 0.1 10*3/uL (ref 0.0–0.1)
EOS%: 1.7 % (ref 0.0–7.0)
Eosinophils Absolute: 0.1 10*3/uL (ref 0.0–0.5)
HCT: 30.7 % — ABNORMAL LOW (ref 34.8–46.6)
HEMOGLOBIN: 10 g/dL — AB (ref 11.6–15.9)
LYMPH%: 19 % (ref 14.0–49.7)
MCH: 26.9 pg (ref 25.1–34.0)
MCHC: 32.7 g/dL (ref 31.5–36.0)
MCV: 82.4 fL (ref 79.5–101.0)
MONO#: 0.5 10*3/uL (ref 0.1–0.9)
MONO%: 10.5 % (ref 0.0–14.0)
NEUT#: 2.9 10*3/uL (ref 1.5–6.5)
NEUT%: 66.6 % (ref 38.4–76.8)
Platelets: 178 10*3/uL (ref 145–400)
RBC: 3.73 10*6/uL (ref 3.70–5.45)
RDW: 19.7 % — AB (ref 11.2–14.5)
WBC: 4.4 10*3/uL (ref 3.9–10.3)
lymph#: 0.8 10*3/uL — ABNORMAL LOW (ref 0.9–3.3)

## 2016-03-20 LAB — COMPREHENSIVE METABOLIC PANEL
ALT: 39 U/L (ref 0–55)
AST: 38 U/L — AB (ref 5–34)
Albumin: 3.7 g/dL (ref 3.5–5.0)
Alkaline Phosphatase: 107 U/L (ref 40–150)
Anion Gap: 10 mEq/L (ref 3–11)
BUN: 12.9 mg/dL (ref 7.0–26.0)
CHLORIDE: 104 meq/L (ref 98–109)
CO2: 23 meq/L (ref 22–29)
Calcium: 9.8 mg/dL (ref 8.4–10.4)
Creatinine: 0.8 mg/dL (ref 0.6–1.1)
EGFR: 85 mL/min/{1.73_m2} — ABNORMAL LOW (ref >90)
GLUCOSE: 139 mg/dL (ref 70–140)
POTASSIUM: 4.3 meq/L (ref 3.5–5.1)
Sodium: 137 mEq/L (ref 136–145)
Total Bilirubin: 0.41 mg/dL (ref 0.20–1.20)
Total Protein: 6.8 g/dL (ref 6.4–8.3)

## 2016-03-20 MED ORDER — ONDANSETRON HCL 8 MG PO TABS
8.0000 mg | ORAL_TABLET | Freq: Once | ORAL | Status: AC
  Administered 2016-03-20: 8 mg via ORAL

## 2016-03-20 MED ORDER — BORTEZOMIB CHEMO SQ INJECTION 3.5 MG (2.5MG/ML)
1.3000 mg/m2 | Freq: Once | INTRAMUSCULAR | Status: AC
  Administered 2016-03-20: 3 mg via SUBCUTANEOUS
  Filled 2016-03-20: qty 3

## 2016-03-20 MED ORDER — ONDANSETRON HCL 8 MG PO TABS
ORAL_TABLET | ORAL | Status: AC
  Filled 2016-03-20: qty 1

## 2016-03-20 NOTE — Patient Instructions (Signed)
[facility] Discharge Instructions for Patients Receiving Chemotherapy  Today you received the following chemotherapy agents VELCADE To help prevent nausea and vomiting after your treatment, we encourage you to take your nausea medication AS PRESCRIBED.  If you develop nausea and vomiting that is not controlled by your nausea medication, call the clinic.   BELOW ARE SYMPTOMS THAT SHOULD BE REPORTED IMMEDIATELY:  *FEVER GREATER THAN 100.5 F  *CHILLS WITH OR WITHOUT FEVER  NAUSEA AND VOMITING THAT IS NOT CONTROLLED WITH YOUR NAUSEA MEDICATION  *UNUSUAL SHORTNESS OF BREATH  *UNUSUAL BRUISING OR BLEEDING  TENDERNESS IN MOUTH AND THROAT WITH OR WITHOUT PRESENCE OF ULCERS  *URINARY PROBLEMS  *BOWEL PROBLEMS  UNUSUAL RASH Items with * indicate a potential emergency and should be followed up as soon as possible.  Feel free to call the clinic you have any questions or concerns. The clinic phone number is [phone].  Please show the [facility] at check-in to the Emergency Department and triage nurse.

## 2016-03-20 NOTE — Progress Notes (Signed)
[facility] [phone]   [phone]  OFFICE PROGRESS NOTE  [name], MD [place] [address]  DIAGNOSIS: Plasma cell dyscrasia initially diagnosed as MGUS in September 2010, with additional symptoms suggestive of POEMS syndrome.   PRIOR THERAPY:  1) Velcade 1.3 MG/M2 subcutaneously with Decadron 40 mg by mouth on a weekly basis. First cycle 11/24/2013. She status post 31 weekly doses of treatment. 2) Velcade 1.3 MG/M2 subcutaneously and weekly basis with Decadron 40 mg by mouth weekly. First dose 02/01/2015. Status post 28 cycles. 3) Revlimid 25 mg by mouth daily for 21 days every 4 weeks with weekly Decadron 20 mg. started in 11/27/2015. Status post 3 cycles discontinued secondary to lack of response.  CURRENT THERAPY: Systemic treatment with Velcade 1.3 MG/KG weekly, Revlimid 25 mg by mouth daily for 21 days every 4 weeks in addition to Decadron 20 mg by mouth weekly. First dose 03/06/2016. Status post one cycle. She is starting cycle #2 on 03/20/2016.  INTERVAL HISTORY: Brenda Conner 56 y.o. female came to the clinic today for follow-up visit. The patient is currently undergoing treatment with weekly subcutaneous Velcade, Revlimid for 21 days every 4 weeks in addition to weekly Decadron. She is tolerating this treatment well with no significant adverse effects except for fatigue which was much better on the off week of Revlimid. She denied having any nausea, vomiting, diarrhea or constipation. She has no fever or chills. She denied having any chest pain, shortness of breath, cough or hemoptysis. She is here today for evaluation and repeat blood work.    MEDICAL HISTORY: Past Medical History:  Diagnosis Date  . Acid reflux   . Anxiety   . Asthma   . Cancer [facility])    waldenstroms/ macroglobinulemia  . Depression   . Depression   . Diabetes mellitus without complication ([place])   . Dysuria 02/28/2016  .  Hypercholesteremia   . Hypertension   . Hypothyroidism   . Macroglobulinemia ([place])    ? POEMS syndrome  . Multiple myeloma ([place])   . Obesity   . PONV (postoperative nausea and vomiting)   . Sleep apnea    CPAP at bedtime  . Sleep apnea     ALLERGIES:  is allergic to codeine; hydrocodone; lortab [hydrocodone-acetaminophen]; onion; shellfish allergy; and amoxicillin.  MEDICATIONS:  Current Outpatient Prescriptions  Medication Sig Dispense Refill  . acyclovir (ZOVIRAX) 400 MG tablet Take 1 tablet (400 mg total) by mouth 2 (two) times daily. 180 tablet 0  . ALPRAZolam (XANAX) 0.5 MG tablet Take 1 tablet (0.5 mg total) by mouth at bedtime as needed for anxiety. 30 tablet 0  . aspirin 81 MG tablet Take 81 mg by mouth daily.    . Azelastine HCl 0.15 % SOLN as needed.    . B-D UF III MINI PEN NEEDLES 31G X 5 MM MISC     . Blood Glucose Monitoring Suppl (ONE TOUCH ULTRA MINI) W/DEVICE KIT See admin instructions. Reported on 01/25/2015  0  . Cetirizine HCl (ZYRTEC ALLERGY PO) Take by mouth daily.    [name] dexamethasone (DECADRON) 4 MG tablet 5 tablet by mouth every week. Start with the first dose of chemotherapy. 40 tablet 3  . DiphenhydrAMINE HCl (BENADRYL ALLERGY PO) Take by mouth as needed.    [name] EPIPEN 2-PAK 0.3 MG/0.3ML SOAJ injection Reported on 06/21/2015  1  . Fexofenadine HCl (ALLEGRA ALLERGY PO) Take by mouth daily.    . Fluticasone-Salmeterol (ADVAIR)  100-50 MCG/DOSE AEPB Inhale 2 puffs into the lungs every 12 (twelve) hours.    [name] ibuprofen (ADVIL,MOTRIN) 100 MG tablet Take 100 mg by mouth every 6 (six) hours as needed. Reported on 05/31/2015    . insulin glargine (LANTUS) 100 UNIT/ML injection Inject 8 Units into the skin 2 (two) times a week. Reported on 02/18/2015    . levothyroxine (SYNTHROID, LEVOTHROID) 150 MCG tablet Take 150 mcg by mouth daily before breakfast.    . lisinopril (PRINIVIL,ZESTRIL) 10 MG tablet Take 10 mg by mouth daily.  3  . loperamide (IMODIUM) 2 MG capsule Take 2  mg by mouth as needed for diarrhea or loose stools.    . metFORMIN (GLUCOPHAGE) 1000 MG tablet Take 1,000 mg by mouth 2 (two) times daily.  3  . NOVOLOG FLEXPEN 100 UNIT/ML FlexPen 3 Units daily as needed. Reported on 01/25/2015  6  . omeprazole (PRILOSEC) 40 MG capsule Take 40 mg by mouth 2 (two) times daily.     . ondansetron (ZOFRAN-ODT) 8 MG disintegrating tablet Take 1 tablet (8 mg total) by mouth every 8 (eight) hours as needed. Reported on 05/31/2015 20 tablet 2  . ONE TOUCH ULTRA TEST test strip See admin instructions. Reported on 01/25/2015  11  . pravastatin (PRAVACHOL) 40 MG tablet Take 40 mg by mouth every evening.     [name] PROAIR HFA 108 (90 Base) MCG/ACT inhaler Reported on 06/21/2015  1  . REVLIMID 25 MG capsule TAKE 1 CAPSULE (25 MG) BY MOUTH ONCE DAILY 21 DAYS ON AND 7 DAYS OFF EVERY 4 WEEKS 21 capsule 0  . sertraline (ZOLOFT) 50 MG tablet Take 3 tablets (150 mg total) by mouth daily. 270 tablet 4  . verapamil (CALAN-SR) 240 MG CR tablet Take 240 mg by mouth 2 (two) times daily.    [name] warfarin (COUMADIN) 2 MG tablet Take 1 tablet (2 mg total) by mouth daily. 90 tablet 0   No current facility-administered medications for this visit.     SURGICAL HISTORY:  Past Surgical History:  Procedure Laterality Date  . ABLATION  09/2007   HTA and polyp resection  . BACK SURGERY  03/2004   herniation, L4-L5  . Bil Laprascopic knee surgery    . BONE MARROW BIOPSY     2011  . BONE MARROW BIOPSY  4/14  . CHOLECYSTECTOMY    . FOOT SURGERY  1998  . KNEE ARTHROSCOPY W/ MENISCAL REPAIR  10/10 ; 3/11  . LAPAROSCOPIC CHOLECYSTECTOMY  1997  . NASAL SINUS SURGERY  2004  . UTERINE FIBROID EMBOLIZATION      REVIEW OF SYSTEMS:  A comprehensive review of systems was negative except for: Constitutional: positive for fatigue   PHYSICAL EXAMINATION: General appearance: alert, cooperative, fatigued and no distress Head: Normocephalic, without obvious abnormality, atraumatic Neck: no adenopathy Lymph  nodes: Cervical, supraclavicular, and axillary nodes normal. Resp: clear to auscultation bilaterally Back: symmetric, no curvature. ROM normal. No CVA tenderness. Cardio: regular rate and rhythm, S1, S2 normal, no murmur, click, rub or gallop GI: soft, non-tender; bowel sounds normal; no masses,  no organomegaly Extremities: extremities normal, atraumatic, no cyanosis or edema  ECOG PERFORMANCE STATUS: 1 - Symptomatic but completely ambulatory  Blood pressure 124/79, pulse 66, temperature 98 F (36.7 C), temperature source Oral, resp. rate 18, height 5' 7"  (1.702 m), weight 253 lb 12.8 oz (115.1 kg), SpO2 99 %.  LABORATORY DATA: Lab Results  Component Value Date   WBC 4.4 03/20/2016   HGB 10.0 (L)  03/20/2016   HCT 30.7 (L) 03/20/2016   MCV 82.4 03/20/2016   PLT 178 03/20/2016      Chemistry      Component Value Date/Time   NA 138 03/13/2016 0837   K 4.0 03/13/2016 0837   CL 104 04/07/2012 1415   CO2 24 03/13/2016 0837   BUN 11.2 03/13/2016 0837   CREATININE 0.8 03/13/2016 0837      Component Value Date/Time   CALCIUM 9.5 03/13/2016 0837   ALKPHOS 114 03/13/2016 0837   AST 35 (H) 03/13/2016 0837   ALT 41 03/13/2016 0837   BILITOT 0.65 03/13/2016 0837      ASSESSMENT AND PLAN:  This is a very pleasant 56 years old white female with multiple myeloma status post several chemotherapy regimens and recently started treatment with weekly subcutaneous Velcade, daily Revlimid for 21 days every 4 weeks as well as weekly Decadron. The patient tolerated the first cycle well except for fatigue. I recommended for her to proceed with cycle #2 today as a scheduled. I will see her back for follow-up visit in 4 weeks for evaluation before starting cycle #3. She was advised to call immediately if she has any concerning symptoms in the interval. All questions were answered. The patient knows to call the clinic with any problems, questions or concerns. We can certainly see the patient much  sooner if necessary. I spent 10 minutes counseling the patient face to face. The total time spent in the appointment was 15 minutes. Disclaimer: This note was dictated with voice recognition software. Similar sounding words can inadvertently be transcribed and may not be corrected upon review.

## 2016-03-24 ENCOUNTER — Other Ambulatory Visit: Payer: Self-pay | Admitting: Obstetrics & Gynecology

## 2016-03-26 NOTE — Telephone Encounter (Signed)
Medication refill request: Zoloft 50mg  Last AEX:  02/18/15 SM Next AEX: 06/05/16 Last MMG (if hormonal medication request): 09/15/14 BIRADS 1 negative Refill authorized: 02/18/15 #[id number] w/4 refills; today please advise

## 2016-03-27 ENCOUNTER — Telehealth: Payer: Self-pay | Admitting: Internal Medicine

## 2016-03-27 ENCOUNTER — Other Ambulatory Visit: Payer: BLUE CROSS/BLUE SHIELD

## 2016-03-27 ENCOUNTER — Other Ambulatory Visit (HOSPITAL_BASED_OUTPATIENT_CLINIC_OR_DEPARTMENT_OTHER): Payer: BLUE CROSS/BLUE SHIELD

## 2016-03-27 ENCOUNTER — Ambulatory Visit (HOSPITAL_BASED_OUTPATIENT_CLINIC_OR_DEPARTMENT_OTHER): Payer: BLUE CROSS/BLUE SHIELD

## 2016-03-27 DIAGNOSIS — C9000 Multiple myeloma not having achieved remission: Secondary | ICD-10-CM

## 2016-03-27 DIAGNOSIS — Z5112 Encounter for antineoplastic immunotherapy: Secondary | ICD-10-CM

## 2016-03-27 LAB — CBC WITH DIFFERENTIAL/PLATELET
BASO%: 0.5 % (ref 0.0–2.0)
BASOS ABS: 0 10*3/uL (ref 0.0–0.1)
EOS ABS: 0.1 10*3/uL (ref 0.0–0.5)
EOS%: 2.9 % (ref 0.0–7.0)
HEMATOCRIT: 30.7 % — AB (ref 34.8–46.6)
HEMOGLOBIN: 9.6 g/dL — AB (ref 11.6–15.9)
LYMPH#: 0.8 10*3/uL — AB (ref 0.9–3.3)
LYMPH%: 20.2 % (ref 14.0–49.7)
MCH: 26.3 pg (ref 25.1–34.0)
MCHC: 31.3 g/dL — ABNORMAL LOW (ref 31.5–36.0)
MCV: 84.1 fL (ref 79.5–101.0)
MONO#: 0.2 10*3/uL (ref 0.1–0.9)
MONO%: 5.3 % (ref 0.0–14.0)
NEUT#: 2.7 10*3/uL (ref 1.5–6.5)
NEUT%: 71.1 % (ref 38.4–76.8)
Platelets: 125 10*3/uL — ABNORMAL LOW (ref 145–400)
RBC: 3.65 10*6/uL — ABNORMAL LOW (ref 3.70–5.45)
RDW: 18.1 % — AB (ref 11.2–14.5)
WBC: 3.8 10*3/uL — ABNORMAL LOW (ref 3.9–10.3)

## 2016-03-27 LAB — COMPREHENSIVE METABOLIC PANEL
ALK PHOS: 108 U/L (ref 40–150)
ALT: 48 U/L (ref 0–55)
AST: 39 U/L — ABNORMAL HIGH (ref 5–34)
Albumin: 3.6 g/dL (ref 3.5–5.0)
Anion Gap: 12 mEq/L — ABNORMAL HIGH (ref 3–11)
BUN: 6.9 mg/dL — ABNORMAL LOW (ref 7.0–26.0)
CALCIUM: 9.2 mg/dL (ref 8.4–10.4)
CO2: 21 mEq/L — ABNORMAL LOW (ref 22–29)
Chloride: 106 mEq/L (ref 98–109)
Creatinine: 0.7 mg/dL (ref 0.6–1.1)
Glucose: 124 mg/dl (ref 70–140)
POTASSIUM: 3.9 meq/L (ref 3.5–5.1)
Sodium: 139 mEq/L (ref 136–145)
Total Bilirubin: 0.55 mg/dL (ref 0.20–1.20)
Total Protein: 6.5 g/dL (ref 6.4–8.3)

## 2016-03-27 MED ORDER — ONDANSETRON HCL 8 MG PO TABS
8.0000 mg | ORAL_TABLET | Freq: Once | ORAL | Status: AC
  Administered 2016-03-27: 8 mg via ORAL

## 2016-03-27 MED ORDER — ONDANSETRON HCL 8 MG PO TABS
ORAL_TABLET | ORAL | Status: AC
  Filled 2016-03-27: qty 1

## 2016-03-27 MED ORDER — BORTEZOMIB CHEMO SQ INJECTION 3.5 MG (2.5MG/ML)
1.3000 mg/m2 | Freq: Once | INTRAMUSCULAR | Status: AC
  Administered 2016-03-27: 3 mg via SUBCUTANEOUS
  Filled 2016-03-27: qty 3

## 2016-03-27 NOTE — Telephone Encounter (Signed)
Appointments scheduled, per 03/20/16 los. Per Dr MM (verbal), schedule up to 04/17/16, per patient will be out of town on 05/01/16 for 1 week. Patient was given a copy of the AVS report and appointment schedule, per 03/20/16 los.

## 2016-03-27 NOTE — Patient Instructions (Signed)
[facility] Discharge Instructions for Patients Receiving Chemotherapy  Today you received the following chemotherapy agents VELCADE To help prevent nausea and vomiting after your treatment, we encourage you to take your nausea medication AS PRESCRIBED.  If you develop nausea and vomiting that is not controlled by your nausea medication, call the clinic.   BELOW ARE SYMPTOMS THAT SHOULD BE REPORTED IMMEDIATELY:  *FEVER GREATER THAN 100.5 F  *CHILLS WITH OR WITHOUT FEVER  NAUSEA AND VOMITING THAT IS NOT CONTROLLED WITH YOUR NAUSEA MEDICATION  *UNUSUAL SHORTNESS OF BREATH  *UNUSUAL BRUISING OR BLEEDING  TENDERNESS IN MOUTH AND THROAT WITH OR WITHOUT PRESENCE OF ULCERS  *URINARY PROBLEMS  *BOWEL PROBLEMS  UNUSUAL RASH Items with * indicate a potential emergency and should be followed up as soon as possible.  Feel free to call the clinic you have any questions or concerns. The clinic phone number is [phone].  Please show the [place] at check-in to the Emergency Department and triage nurse.

## 2016-03-30 ENCOUNTER — Other Ambulatory Visit: Payer: Self-pay | Admitting: Medical Oncology

## 2016-03-30 ENCOUNTER — Other Ambulatory Visit: Payer: Self-pay | Admitting: Internal Medicine

## 2016-03-30 DIAGNOSIS — C9000 Multiple myeloma not having achieved remission: Secondary | ICD-10-CM

## 2016-03-30 MED ORDER — REVLIMID 25 MG PO CAPS: ORAL_CAPSULE | ORAL | 0 refills | Status: DC

## 2016-03-31 DIAGNOSIS — M5412 Radiculopathy, cervical region: Secondary | ICD-10-CM | POA: Diagnosis not present

## 2016-03-31 DIAGNOSIS — M542 Cervicalgia: Secondary | ICD-10-CM | POA: Diagnosis not present

## 2016-04-03 ENCOUNTER — Other Ambulatory Visit (HOSPITAL_BASED_OUTPATIENT_CLINIC_OR_DEPARTMENT_OTHER): Payer: BLUE CROSS/BLUE SHIELD

## 2016-04-03 ENCOUNTER — Ambulatory Visit (HOSPITAL_BASED_OUTPATIENT_CLINIC_OR_DEPARTMENT_OTHER): Payer: BLUE CROSS/BLUE SHIELD

## 2016-04-03 DIAGNOSIS — Z5112 Encounter for antineoplastic immunotherapy: Secondary | ICD-10-CM | POA: Diagnosis not present

## 2016-04-03 DIAGNOSIS — C9000 Multiple myeloma not having achieved remission: Secondary | ICD-10-CM

## 2016-04-03 LAB — COMPREHENSIVE METABOLIC PANEL
ALT: 53 U/L (ref 0–55)
AST: 47 U/L — ABNORMAL HIGH (ref 5–34)
Albumin: 3.5 g/dL (ref 3.5–5.0)
Alkaline Phosphatase: 111 U/L (ref 40–150)
Anion Gap: 10 mEq/L (ref 3–11)
BILIRUBIN TOTAL: 0.57 mg/dL (ref 0.20–1.20)
BUN: 7.7 mg/dL (ref 7.0–26.0)
CHLORIDE: 107 meq/L (ref 98–109)
CO2: 22 meq/L (ref 22–29)
Calcium: 9 mg/dL (ref 8.4–10.4)
Creatinine: 0.7 mg/dL (ref 0.6–1.1)
GLUCOSE: 118 mg/dL (ref 70–140)
Potassium: 3.5 mEq/L (ref 3.5–5.1)
SODIUM: 140 meq/L (ref 136–145)
TOTAL PROTEIN: 6.5 g/dL (ref 6.4–8.3)

## 2016-04-03 LAB — CBC WITH DIFFERENTIAL/PLATELET
BASO%: 0.5 % (ref 0.0–2.0)
Basophils Absolute: 0 10*3/uL (ref 0.0–0.1)
EOS%: 4.7 % (ref 0.0–7.0)
Eosinophils Absolute: 0.2 10*3/uL (ref 0.0–0.5)
HCT: 30.2 % — ABNORMAL LOW (ref 34.8–46.6)
HGB: 9.5 g/dL — ABNORMAL LOW (ref 11.6–15.9)
LYMPH#: 0.8 10*3/uL — AB (ref 0.9–3.3)
LYMPH%: 20.3 % (ref 14.0–49.7)
MCH: 26.8 pg (ref 25.1–34.0)
MCHC: 31.5 g/dL (ref 31.5–36.0)
MCV: 85.1 fL (ref 79.5–101.0)
MONO#: 0.5 10*3/uL (ref 0.1–0.9)
MONO%: 11.8 % (ref 0.0–14.0)
NEUT%: 62.7 % (ref 38.4–76.8)
NEUTROS ABS: 2.6 10*3/uL (ref 1.5–6.5)
NRBC: 0 % (ref 0–0)
Platelets: 106 10*3/uL — ABNORMAL LOW (ref 145–400)
RBC: 3.55 10*6/uL — AB (ref 3.70–5.45)
RDW: 18.4 % — ABNORMAL HIGH (ref 11.2–14.5)
WBC: 4.1 10*3/uL (ref 3.9–10.3)

## 2016-04-03 MED ORDER — BORTEZOMIB CHEMO SQ INJECTION 3.5 MG (2.5MG/ML)
1.3000 mg/m2 | Freq: Once | INTRAMUSCULAR | Status: AC
  Administered 2016-04-03: 3 mg via SUBCUTANEOUS
  Filled 2016-04-03: qty 3

## 2016-04-03 MED ORDER — ONDANSETRON HCL 8 MG PO TABS
ORAL_TABLET | ORAL | Status: AC
  Filled 2016-04-03: qty 1

## 2016-04-03 MED ORDER — ONDANSETRON HCL 8 MG PO TABS
8.0000 mg | ORAL_TABLET | Freq: Once | ORAL | Status: AC
  Administered 2016-04-03: 8 mg via ORAL

## 2016-04-03 NOTE — Patient Instructions (Signed)
[facility] Discharge Instructions for Patients Receiving Chemotherapy  Today you received the following chemotherapy agents Velcade. To help prevent nausea and vomiting after your treatment, we encourage you to take your nausea medication as directed.  If you develop nausea and vomiting that is not controlled by your nausea medication, call the clinic.   BELOW ARE SYMPTOMS THAT SHOULD BE REPORTED IMMEDIATELY:  *FEVER GREATER THAN 100.5 F  *CHILLS WITH OR WITHOUT FEVER  NAUSEA AND VOMITING THAT IS NOT CONTROLLED WITH YOUR NAUSEA MEDICATION  *UNUSUAL SHORTNESS OF BREATH  *UNUSUAL BRUISING OR BLEEDING  TENDERNESS IN MOUTH AND THROAT WITH OR WITHOUT PRESENCE OF ULCERS  *URINARY PROBLEMS  *BOWEL PROBLEMS  UNUSUAL RASH Items with * indicate a potential emergency and should be followed up as soon as possible.  Feel free to call the clinic you have any questions or concerns. The clinic phone number is [phone].  Please show the CHEMO ALERT CARD at check-in to the Emergency Department and triage nurse.    

## 2016-04-10 ENCOUNTER — Ambulatory Visit (HOSPITAL_BASED_OUTPATIENT_CLINIC_OR_DEPARTMENT_OTHER): Payer: BLUE CROSS/BLUE SHIELD

## 2016-04-10 ENCOUNTER — Other Ambulatory Visit (HOSPITAL_BASED_OUTPATIENT_CLINIC_OR_DEPARTMENT_OTHER): Payer: BLUE CROSS/BLUE SHIELD

## 2016-04-10 DIAGNOSIS — C9000 Multiple myeloma not having achieved remission: Secondary | ICD-10-CM | POA: Diagnosis not present

## 2016-04-10 DIAGNOSIS — Z5112 Encounter for antineoplastic immunotherapy: Secondary | ICD-10-CM

## 2016-04-10 LAB — COMPREHENSIVE METABOLIC PANEL
ALT: 48 U/L (ref 0–55)
ANION GAP: 12 meq/L — AB (ref 3–11)
AST: 44 U/L — ABNORMAL HIGH (ref 5–34)
Albumin: 3.3 g/dL — ABNORMAL LOW (ref 3.5–5.0)
Alkaline Phosphatase: 111 U/L (ref 40–150)
BUN: 5.8 mg/dL — ABNORMAL LOW (ref 7.0–26.0)
CHLORIDE: 106 meq/L (ref 98–109)
CO2: 23 mEq/L (ref 22–29)
Calcium: 8.9 mg/dL (ref 8.4–10.4)
Creatinine: 0.7 mg/dL (ref 0.6–1.1)
Glucose: 117 mg/dl (ref 70–140)
Potassium: 3.5 mEq/L (ref 3.5–5.1)
SODIUM: 140 meq/L (ref 136–145)
Total Bilirubin: 0.62 mg/dL (ref 0.20–1.20)
Total Protein: 6.2 g/dL — ABNORMAL LOW (ref 6.4–8.3)

## 2016-04-10 LAB — CBC WITH DIFFERENTIAL/PLATELET
BASO%: 0.7 % (ref 0.0–2.0)
Basophils Absolute: 0 10*3/uL (ref 0.0–0.1)
EOS ABS: 0.2 10*3/uL (ref 0.0–0.5)
EOS%: 6.5 % (ref 0.0–7.0)
HCT: 28.1 % — ABNORMAL LOW (ref 34.8–46.6)
HEMOGLOBIN: 8.8 g/dL — AB (ref 11.6–15.9)
LYMPH#: 0.7 10*3/uL — AB (ref 0.9–3.3)
LYMPH%: 23.7 % (ref 14.0–49.7)
MCH: 26.8 pg (ref 25.1–34.0)
MCHC: 31.3 g/dL — ABNORMAL LOW (ref 31.5–36.0)
MCV: 85.7 fL (ref 79.5–101.0)
MONO#: 0.3 10*3/uL (ref 0.1–0.9)
MONO%: 10.8 % (ref 0.0–14.0)
NEUT%: 58.3 % (ref 38.4–76.8)
NEUTROS ABS: 1.6 10*3/uL (ref 1.5–6.5)
NRBC: 0 % (ref 0–0)
Platelets: 108 10*3/uL — ABNORMAL LOW (ref 145–400)
RBC: 3.28 10*6/uL — AB (ref 3.70–5.45)
RDW: 18.8 % — AB (ref 11.2–14.5)
WBC: 2.8 10*3/uL — AB (ref 3.9–10.3)

## 2016-04-10 MED ORDER — ONDANSETRON HCL 8 MG PO TABS
ORAL_TABLET | ORAL | Status: AC
  Filled 2016-04-10: qty 1

## 2016-04-10 MED ORDER — BORTEZOMIB CHEMO SQ INJECTION 3.5 MG (2.5MG/ML)
1.3000 mg/m2 | Freq: Once | INTRAMUSCULAR | Status: AC
  Administered 2016-04-10: 3 mg via SUBCUTANEOUS
  Filled 2016-04-10: qty 3

## 2016-04-10 MED ORDER — ONDANSETRON HCL 8 MG PO TABS
8.0000 mg | ORAL_TABLET | Freq: Once | ORAL | Status: AC
  Administered 2016-04-10: 8 mg via ORAL

## 2016-04-10 NOTE — Patient Instructions (Signed)
[facility] Discharge Instructions for Patients Receiving Chemotherapy  Today you received the following chemotherapy agents:  Velcade  To help prevent nausea and vomiting after your treatment, we encourage you to take your nausea medication as prescribed.   If you develop nausea and vomiting that is not controlled by your nausea medication, call the clinic.   BELOW ARE SYMPTOMS THAT SHOULD BE REPORTED IMMEDIATELY:  *FEVER GREATER THAN 100.5 F  *CHILLS WITH OR WITHOUT FEVER  NAUSEA AND VOMITING THAT IS NOT CONTROLLED WITH YOUR NAUSEA MEDICATION  *UNUSUAL SHORTNESS OF BREATH  *UNUSUAL BRUISING OR BLEEDING  TENDERNESS IN MOUTH AND THROAT WITH OR WITHOUT PRESENCE OF ULCERS  *URINARY PROBLEMS  *BOWEL PROBLEMS  UNUSUAL RASH Items with * indicate a potential emergency and should be followed up as soon as possible.  Feel free to call the clinic you have any questions or concerns. The clinic phone number is [phone].  Please show the CHEMO ALERT CARD at check-in to the Emergency Department and triage nurse.   

## 2016-04-14 DIAGNOSIS — M542 Cervicalgia: Secondary | ICD-10-CM | POA: Diagnosis not present

## 2016-04-14 DIAGNOSIS — M4003 Postural kyphosis, cervicothoracic region: Secondary | ICD-10-CM | POA: Diagnosis not present

## 2016-04-16 ENCOUNTER — Ambulatory Visit (HOSPITAL_BASED_OUTPATIENT_CLINIC_OR_DEPARTMENT_OTHER): Payer: BLUE CROSS/BLUE SHIELD | Admitting: Internal Medicine

## 2016-04-16 ENCOUNTER — Telehealth: Payer: Self-pay | Admitting: Internal Medicine

## 2016-04-16 ENCOUNTER — Other Ambulatory Visit (HOSPITAL_BASED_OUTPATIENT_CLINIC_OR_DEPARTMENT_OTHER): Payer: BLUE CROSS/BLUE SHIELD

## 2016-04-16 ENCOUNTER — Encounter: Payer: Self-pay | Admitting: Internal Medicine

## 2016-04-16 DIAGNOSIS — R530 Neoplastic (malignant) related fatigue: Secondary | ICD-10-CM

## 2016-04-16 DIAGNOSIS — C9000 Multiple myeloma not having achieved remission: Secondary | ICD-10-CM

## 2016-04-16 LAB — COMPREHENSIVE METABOLIC PANEL
ALT: 37 U/L (ref 0–55)
AST: 34 U/L (ref 5–34)
Albumin: 3.7 g/dL (ref 3.5–5.0)
Alkaline Phosphatase: 115 U/L (ref 40–150)
Anion Gap: 11 mEq/L (ref 3–11)
BUN: 8.5 mg/dL (ref 7.0–26.0)
CHLORIDE: 107 meq/L (ref 98–109)
CO2: 22 meq/L (ref 22–29)
Calcium: 9.3 mg/dL (ref 8.4–10.4)
Creatinine: 0.7 mg/dL (ref 0.6–1.1)
Glucose: 94 mg/dl (ref 70–140)
POTASSIUM: 4 meq/L (ref 3.5–5.1)
Sodium: 140 mEq/L (ref 136–145)
Total Bilirubin: 0.68 mg/dL (ref 0.20–1.20)
Total Protein: 6.7 g/dL (ref 6.4–8.3)

## 2016-04-16 LAB — CBC WITH DIFFERENTIAL/PLATELET
BASO%: 0.9 % (ref 0.0–2.0)
Basophils Absolute: 0 10*3/uL (ref 0.0–0.1)
EOS%: 1.8 % (ref 0.0–7.0)
Eosinophils Absolute: 0.1 10*3/uL (ref 0.0–0.5)
HCT: 29.6 % — ABNORMAL LOW (ref 34.8–46.6)
HGB: 9.2 g/dL — ABNORMAL LOW (ref 11.6–15.9)
LYMPH#: 0.9 10*3/uL (ref 0.9–3.3)
LYMPH%: 27.1 % (ref 14.0–49.7)
MCH: 26.8 pg (ref 25.1–34.0)
MCHC: 31.1 g/dL — AB (ref 31.5–36.0)
MCV: 86.3 fL (ref 79.5–101.0)
MONO#: 0.4 10*3/uL (ref 0.1–0.9)
MONO%: 10.3 % (ref 0.0–14.0)
NEUT#: 2 10*3/uL (ref 1.5–6.5)
NEUT%: 59.9 % (ref 38.4–76.8)
Platelets: 117 10*3/uL — ABNORMAL LOW (ref 145–400)
RBC: 3.43 10*6/uL — AB (ref 3.70–5.45)
RDW: 18.8 % — AB (ref 11.2–14.5)
WBC: 3.4 10*3/uL — ABNORMAL LOW (ref 3.9–10.3)

## 2016-04-16 MED ORDER — WARFARIN SODIUM 2 MG PO TABS: 2.0000 mg | ORAL_TABLET | Freq: Every day | ORAL | 0 refills | Status: DC

## 2016-04-16 MED ORDER — ACYCLOVIR 400 MG PO TABS: 400.0000 mg | ORAL_TABLET | Freq: Two times a day (BID) | ORAL | 0 refills | Status: DC

## 2016-04-16 NOTE — Progress Notes (Signed)
[facility] [phone]   [phone]  OFFICE PROGRESS NOTE  [name], MD [place] [address]  DIAGNOSIS: Plasma cell dyscrasia initially diagnosed as MGUS in September 2010, with additional symptoms suggestive of POEMS syndrome.   PRIOR THERAPY:  1) Velcade 1.3 MG/M2 subcutaneously with Decadron 40 mg by mouth on a weekly basis. First cycle 11/24/2013. She status post 31 weekly doses of treatment. 2) Velcade 1.3 MG/M2 subcutaneously and weekly basis with Decadron 40 mg by mouth weekly. First dose 02/01/2015. Status post 28 cycles. 3) Revlimid 25 mg by mouth daily for 21 days every 4 weeks with weekly Decadron 20 mg. started in 11/27/2015. Status post 3 cycles discontinued secondary to lack of response.  CURRENT THERAPY: Systemic treatment with Velcade 1.3 MG/KG weekly, Revlimid 25 mg by mouth daily for 21 days every 4 weeks in addition to Decadron 20 mg by mouth weekly. First dose 03/06/2016. Status post one cycle. She is starting cycle #3 on 04/16/2016.  INTERVAL HISTORY: Brenda Conner 56 y.o. female returns to the clinic today for follow-up visit. The patient is feeling fine with no specific complaints. She tolerated the previous cycle of her treatment fairly well except for fatigue which is significantly improved. She denied having any recent weight loss or night sweats. She has no nausea, vomiting, diarrhea or constipation. She denied having any chest pain, shortness of breath, cough or hemoptysis. She continues to have mild peripheral neuropathy. She is here today for evaluation and repeat blood work. She is expected to start cycle #3 later today.     MEDICAL HISTORY: Past Medical History:  Diagnosis Date  . Acid reflux   . Anxiety   . Asthma   . Cancer [facility])    waldenstroms/ macroglobinulemia  . Depression   . Depression   . Diabetes mellitus without complication ([place])   . Dysuria 02/28/2016  .  Hypercholesteremia   . Hypertension   . Hypothyroidism   . Macroglobulinemia ([place])    ? POEMS syndrome  . Multiple myeloma ([place])   . Obesity   . PONV (postoperative nausea and vomiting)   . Sleep apnea    CPAP at bedtime  . Sleep apnea     ALLERGIES:  is allergic to codeine; hydrocodone; lortab [hydrocodone-acetaminophen]; onion; shellfish allergy; and amoxicillin.  MEDICATIONS:  Current Outpatient Prescriptions  Medication Sig Dispense Refill  . acyclovir (ZOVIRAX) 400 MG tablet Take 1 tablet (400 mg total) by mouth 2 (two) times daily. 180 tablet 0  . ALPRAZolam (XANAX) 0.5 MG tablet Take 1 tablet (0.5 mg total) by mouth at bedtime as needed for anxiety. 30 tablet 0  . aspirin 81 MG tablet Take 81 mg by mouth daily.    . Azelastine HCl 0.15 % SOLN as needed.    . B-D UF III MINI PEN NEEDLES 31G X 5 MM MISC     . Blood Glucose Monitoring Suppl (ONE TOUCH ULTRA MINI) W/DEVICE KIT See admin instructions. Reported on 01/25/2015  0  . Cetirizine HCl (ZYRTEC ALLERGY PO) Take by mouth daily.    [name] dexamethasone (DECADRON) 4 MG tablet 5 tablet by mouth every week. Start with the first dose of chemotherapy. 40 tablet 3  . DiphenhydrAMINE HCl (BENADRYL ALLERGY PO) Take by mouth as needed.    [name] EPIPEN 2-PAK 0.3 MG/0.3ML SOAJ injection Reported on 06/21/2015  1  . Fexofenadine HCl (ALLEGRA ALLERGY PO) Take by mouth daily.    . Fluticasone-Salmeterol (ADVAIR)  100-50 MCG/DOSE AEPB Inhale 2 puffs into the lungs every 12 (twelve) hours.    [name] ibuprofen (ADVIL,MOTRIN) 100 MG tablet Take 100 mg by mouth every 6 (six) hours as needed. Reported on 05/31/2015    . insulin glargine (LANTUS) 100 UNIT/ML injection Inject 8 Units into the skin 2 (two) times a week. Reported on 02/18/2015    . levothyroxine (SYNTHROID, LEVOTHROID) 150 MCG tablet Take 150 mcg by mouth daily before breakfast.    . lisinopril (PRINIVIL,ZESTRIL) 10 MG tablet Take 10 mg by mouth daily.  3  . loperamide (IMODIUM) 2 MG capsule Take 2  mg by mouth as needed for diarrhea or loose stools.    . metFORMIN (GLUCOPHAGE) 1000 MG tablet Take 1,000 mg by mouth 2 (two) times daily.  3  . NOVOLOG FLEXPEN 100 UNIT/ML FlexPen 3 Units daily as needed. Reported on 01/25/2015  6  . omeprazole (PRILOSEC) 40 MG capsule Take 40 mg by mouth 2 (two) times daily.     . ondansetron (ZOFRAN-ODT) 8 MG disintegrating tablet Take 1 tablet (8 mg total) by mouth every 8 (eight) hours as needed. Reported on 05/31/2015 20 tablet 2  . ONE TOUCH ULTRA TEST test strip See admin instructions. Reported on 01/25/2015  11  . pravastatin (PRAVACHOL) 40 MG tablet Take 40 mg by mouth every evening.     [name] PROAIR HFA 108 (90 Base) MCG/ACT inhaler Reported on 06/21/2015  1  . REVLIMID 25 MG capsule TAKE 1 CAPSULE (25MG) BY MOUTH ONCE DAILY 21 DAYS ON AND 7 DAYS OFF 21 capsule 0  . sertraline (ZOLOFT) 50 MG tablet TAKE 3 TABLETS BY MOUTH EVERY DAY 270 tablet 1  . verapamil (CALAN-SR) 240 MG CR tablet Take 240 mg by mouth 2 (two) times daily.    [name] warfarin (COUMADIN) 2 MG tablet Take 1 tablet (2 mg total) by mouth daily. 90 tablet 0   No current facility-administered medications for this visit.     SURGICAL HISTORY:  Past Surgical History:  Procedure Laterality Date  . ABLATION  09/2007   HTA and polyp resection  . BACK SURGERY  03/2004   herniation, L4-L5  . Bil Laprascopic knee surgery    . BONE MARROW BIOPSY     2011  . BONE MARROW BIOPSY  4/14  . CHOLECYSTECTOMY    . FOOT SURGERY  1998  . KNEE ARTHROSCOPY W/ MENISCAL REPAIR  10/10 ; 3/11  . LAPAROSCOPIC CHOLECYSTECTOMY  1997  . NASAL SINUS SURGERY  2004  . UTERINE FIBROID EMBOLIZATION      REVIEW OF SYSTEMS:  A comprehensive review of systems was negative except for: Constitutional: positive for fatigue Neurological: positive for paresthesia   PHYSICAL EXAMINATION: General appearance: alert, cooperative, fatigued and no distress Head: Normocephalic, without obvious abnormality, atraumatic Neck: no  adenopathy Lymph nodes: Cervical, supraclavicular, and axillary nodes normal. Resp: clear to auscultation bilaterally Back: symmetric, no curvature. ROM normal. No CVA tenderness. Cardio: regular rate and rhythm, S1, S2 normal, no murmur, click, rub or gallop GI: soft, non-tender; bowel sounds normal; no masses,  no organomegaly Extremities: extremities normal, atraumatic, no cyanosis or edema Neurologic: Alert and oriented X 3, normal strength and tone. Normal symmetric reflexes. Normal coordination and gait  ECOG PERFORMANCE STATUS: 1 - Symptomatic but completely ambulatory  Blood pressure (!) 139/51, pulse 71, temperature 98.2 F (36.8 C), temperature source Oral, resp. rate 18, height 5' 7" (1.702 m), weight 257 lb 11.2 oz (116.9 kg), SpO2 100 %.  LABORATORY DATA:  Lab Results  Component Value Date   WBC 3.4 (L) 04/16/2016   HGB 9.2 (L) 04/16/2016   HCT 29.6 (L) 04/16/2016   MCV 86.3 04/16/2016   PLT 117 (L) 04/16/2016      Chemistry      Component Value Date/Time   NA 140 04/16/2016 1122   K 4.0 04/16/2016 1122   CL 104 04/07/2012 1415   CO2 22 04/16/2016 1122   BUN 8.5 04/16/2016 1122   CREATININE 0.7 04/16/2016 1122      Component Value Date/Time   CALCIUM 9.3 04/16/2016 1122   ALKPHOS 115 04/16/2016 1122   AST 34 04/16/2016 1122   ALT 37 04/16/2016 1122   BILITOT 0.68 04/16/2016 1122      ASSESSMENT AND PLAN:  This is a very pleasant 56 years old white female with multiple myeloma status post several chemotherapy regimens with evidence for disease progression few months ago. She is currently undergoing treatment with weekly subcutaneous Velcade, Revlimid 25 mg by mouth for 21 days every 4 weeks as well as weekly Decadron. She is tolerating this treatment well with no significant adverse effects except for mild fatigue. I recommended for the patient to proceed with cycle #3 today as scheduled. I would see her back for follow-up visit in 4 weeks for evaluation. I  gave the patient referral for Coumadin and acyclovir today. She was advised to call immediately if she has any concerning symptoms in the interval. All questions were answered. The patient knows to call the clinic with any problems, questions or concerns. We can certainly see the patient much sooner if necessary. I spent 10 minutes counseling the patient face to face. The total time spent in the appointment was 15 minutes.  Disclaimer: This note was dictated with voice recognition software. Similar sounding words can inadvertently be transcribed and may not be corrected upon review.

## 2016-04-16 NOTE — Telephone Encounter (Signed)
Gave patient AVS and calender per 4/9 los. Per 4/9 los - weely lab and chemo-Patient says she will not be here week of 4/23 - did not schedule appt, sent message to MD .

## 2016-04-17 ENCOUNTER — Ambulatory Visit (HOSPITAL_BASED_OUTPATIENT_CLINIC_OR_DEPARTMENT_OTHER): Payer: BLUE CROSS/BLUE SHIELD

## 2016-04-17 DIAGNOSIS — Z5112 Encounter for antineoplastic immunotherapy: Secondary | ICD-10-CM

## 2016-04-17 DIAGNOSIS — C9000 Multiple myeloma not having achieved remission: Secondary | ICD-10-CM

## 2016-04-17 MED ORDER — ONDANSETRON HCL 8 MG PO TABS
ORAL_TABLET | ORAL | Status: AC
  Filled 2016-04-17: qty 1

## 2016-04-17 MED ORDER — ONDANSETRON HCL 8 MG PO TABS
8.0000 mg | ORAL_TABLET | Freq: Once | ORAL | Status: AC
  Administered 2016-04-17: 8 mg via ORAL

## 2016-04-17 MED ORDER — BORTEZOMIB CHEMO SQ INJECTION 3.5 MG (2.5MG/ML)
1.3000 mg/m2 | Freq: Once | INTRAMUSCULAR | Status: AC
  Administered 2016-04-17: 3 mg via SUBCUTANEOUS
  Filled 2016-04-17: qty 3

## 2016-04-17 NOTE — Patient Instructions (Signed)
[facility] Discharge Instructions for Patients Receiving Chemotherapy  Today you received the following chemotherapy agents:  Velcade  To help prevent nausea and vomiting after your treatment, we encourage you to take your nausea medication as prescribed.   If you develop nausea and vomiting that is not controlled by your nausea medication, call the clinic.   BELOW ARE SYMPTOMS THAT SHOULD BE REPORTED IMMEDIATELY:  *FEVER GREATER THAN 100.5 F  *CHILLS WITH OR WITHOUT FEVER  NAUSEA AND VOMITING THAT IS NOT CONTROLLED WITH YOUR NAUSEA MEDICATION  *UNUSUAL SHORTNESS OF BREATH  *UNUSUAL BRUISING OR BLEEDING  TENDERNESS IN MOUTH AND THROAT WITH OR WITHOUT PRESENCE OF ULCERS  *URINARY PROBLEMS  *BOWEL PROBLEMS  UNUSUAL RASH Items with * indicate a potential emergency and should be followed up as soon as possible.  Feel free to call the clinic you have any questions or concerns. The clinic phone number is [phone].  Please show the CHEMO ALERT CARD at check-in to the Emergency Department and triage nurse.   

## 2016-04-24 ENCOUNTER — Ambulatory Visit (HOSPITAL_BASED_OUTPATIENT_CLINIC_OR_DEPARTMENT_OTHER): Payer: BLUE CROSS/BLUE SHIELD

## 2016-04-24 ENCOUNTER — Other Ambulatory Visit (HOSPITAL_BASED_OUTPATIENT_CLINIC_OR_DEPARTMENT_OTHER): Payer: BLUE CROSS/BLUE SHIELD

## 2016-04-24 DIAGNOSIS — C9000 Multiple myeloma not having achieved remission: Secondary | ICD-10-CM | POA: Diagnosis not present

## 2016-04-24 DIAGNOSIS — Z5112 Encounter for antineoplastic immunotherapy: Secondary | ICD-10-CM

## 2016-04-24 LAB — COMPREHENSIVE METABOLIC PANEL
ALBUMIN: 3.8 g/dL (ref 3.5–5.0)
ALK PHOS: 103 U/L (ref 40–150)
ALT: 36 U/L (ref 0–55)
AST: 34 U/L (ref 5–34)
Anion Gap: 12 mEq/L — ABNORMAL HIGH (ref 3–11)
BILIRUBIN TOTAL: 0.69 mg/dL (ref 0.20–1.20)
BUN: 6.7 mg/dL — ABNORMAL LOW (ref 7.0–26.0)
CHLORIDE: 104 meq/L (ref 98–109)
CO2: 24 meq/L (ref 22–29)
Calcium: 9.5 mg/dL (ref 8.4–10.4)
Creatinine: 0.7 mg/dL (ref 0.6–1.1)
EGFR: >90 mL/min/{1.73_m2} (ref >90)
GLUCOSE: 106 mg/dL (ref 70–140)
Potassium: 3.7 mEq/L (ref 3.5–5.1)
SODIUM: 139 meq/L (ref 136–145)
Total Protein: 6.9 g/dL (ref 6.4–8.3)

## 2016-04-24 LAB — CBC WITH DIFFERENTIAL/PLATELET
BASO%: 1.5 % (ref 0.0–2.0)
BASOS ABS: 0.1 10*3/uL (ref 0.0–0.1)
EOS%: 3.6 % (ref 0.0–7.0)
Eosinophils Absolute: 0.1 10*3/uL (ref 0.0–0.5)
HCT: 30.8 % — ABNORMAL LOW (ref 34.8–46.6)
HEMOGLOBIN: 10.2 g/dL — AB (ref 11.6–15.9)
LYMPH%: 19.3 % (ref 14.0–49.7)
MCH: 27.6 pg (ref 25.1–34.0)
MCHC: 33 g/dL (ref 31.5–36.0)
MCV: 83.4 fL (ref 79.5–101.0)
MONO#: 0.2 10*3/uL (ref 0.1–0.9)
MONO%: 5.8 % (ref 0.0–14.0)
NEUT#: 2.7 10*3/uL (ref 1.5–6.5)
NEUT%: 69.8 % (ref 38.4–76.8)
Platelets: 128 10*3/uL — ABNORMAL LOW (ref 145–400)
RBC: 3.7 10*6/uL (ref 3.70–5.45)
RDW: 19.8 % — AB (ref 11.2–14.5)
WBC: 3.9 10*3/uL (ref 3.9–10.3)
lymph#: 0.8 10*3/uL — ABNORMAL LOW (ref 0.9–3.3)

## 2016-04-24 MED ORDER — ONDANSETRON HCL 8 MG PO TABS
8.0000 mg | ORAL_TABLET | Freq: Once | ORAL | Status: AC
  Administered 2016-04-24: 8 mg via ORAL

## 2016-04-24 MED ORDER — BORTEZOMIB CHEMO SQ INJECTION 3.5 MG (2.5MG/ML)
1.3000 mg/m2 | Freq: Once | INTRAMUSCULAR | Status: AC
  Administered 2016-04-24: 3 mg via SUBCUTANEOUS
  Filled 2016-04-24: qty 3

## 2016-04-24 MED ORDER — ONDANSETRON HCL 8 MG PO TABS
ORAL_TABLET | ORAL | Status: AC
  Filled 2016-04-24: qty 1

## 2016-04-24 NOTE — Patient Instructions (Signed)
[facility] Discharge Instructions for Patients Receiving Chemotherapy  Today you received the following chemotherapy agent: Velcade   To help prevent nausea and vomiting after your treatment, we encourage you to take your nausea medication as prescribed.    If you develop nausea and vomiting that is not controlled by your nausea medication, call the clinic.   BELOW ARE SYMPTOMS THAT SHOULD BE REPORTED IMMEDIATELY:  *FEVER GREATER THAN 100.5 F  *CHILLS WITH OR WITHOUT FEVER  NAUSEA AND VOMITING THAT IS NOT CONTROLLED WITH YOUR NAUSEA MEDICATION  *UNUSUAL SHORTNESS OF BREATH  *UNUSUAL BRUISING OR BLEEDING  TENDERNESS IN MOUTH AND THROAT WITH OR WITHOUT PRESENCE OF ULCERS  *URINARY PROBLEMS  *BOWEL PROBLEMS  UNUSUAL RASH Items with * indicate a potential emergency and should be followed up as soon as possible.  Feel free to call the clinic you have any questions or concerns. The clinic phone number is [phone].  Please show the CHEMO ALERT CARD at check-in to the Emergency Department and triage nurse.   

## 2016-04-28 DIAGNOSIS — M542 Cervicalgia: Secondary | ICD-10-CM | POA: Diagnosis not present

## 2016-04-28 DIAGNOSIS — M4003 Postural kyphosis, cervicothoracic region: Secondary | ICD-10-CM | POA: Diagnosis not present

## 2016-04-28 DIAGNOSIS — M5412 Radiculopathy, cervical region: Secondary | ICD-10-CM | POA: Diagnosis not present

## 2016-04-30 ENCOUNTER — Encounter: Payer: Self-pay | Admitting: Medical Oncology

## 2016-04-30 ENCOUNTER — Other Ambulatory Visit: Payer: Self-pay | Admitting: Internal Medicine

## 2016-04-30 DIAGNOSIS — C9000 Multiple myeloma not having achieved remission: Secondary | ICD-10-CM

## 2016-04-30 MED ORDER — REVLIMID 25 MG PO CAPS: ORAL_CAPSULE | ORAL | 2 refills | Status: DC

## 2016-05-08 ENCOUNTER — Other Ambulatory Visit (HOSPITAL_BASED_OUTPATIENT_CLINIC_OR_DEPARTMENT_OTHER): Payer: BLUE CROSS/BLUE SHIELD

## 2016-05-08 ENCOUNTER — Ambulatory Visit (HOSPITAL_BASED_OUTPATIENT_CLINIC_OR_DEPARTMENT_OTHER): Payer: BLUE CROSS/BLUE SHIELD

## 2016-05-08 DIAGNOSIS — C9000 Multiple myeloma not having achieved remission: Secondary | ICD-10-CM | POA: Diagnosis not present

## 2016-05-08 DIAGNOSIS — Z5112 Encounter for antineoplastic immunotherapy: Secondary | ICD-10-CM

## 2016-05-08 LAB — COMPREHENSIVE METABOLIC PANEL
ALT: 43 U/L (ref 0–55)
AST: 37 U/L — ABNORMAL HIGH (ref 5–34)
Albumin: 3.5 g/dL (ref 3.5–5.0)
Alkaline Phosphatase: 109 U/L (ref 40–150)
Anion Gap: 9 mEq/L (ref 3–11)
BUN: 9.2 mg/dL (ref 7.0–26.0)
CHLORIDE: 105 meq/L (ref 98–109)
CO2: 26 meq/L (ref 22–29)
Calcium: 9.3 mg/dL (ref 8.4–10.4)
Creatinine: 0.7 mg/dL (ref 0.6–1.1)
GLUCOSE: 119 mg/dL (ref 70–140)
POTASSIUM: 3.9 meq/L (ref 3.5–5.1)
SODIUM: 140 meq/L (ref 136–145)
Total Bilirubin: 0.64 mg/dL (ref 0.20–1.20)
Total Protein: 6.5 g/dL (ref 6.4–8.3)

## 2016-05-08 LAB — CBC WITH DIFFERENTIAL/PLATELET
BASO%: 1.3 % (ref 0.0–2.0)
BASOS ABS: 0 10*3/uL (ref 0.0–0.1)
EOS ABS: 0.1 10*3/uL (ref 0.0–0.5)
EOS%: 5.2 % (ref 0.0–7.0)
HCT: 29.7 % — ABNORMAL LOW (ref 34.8–46.6)
HGB: 9.6 g/dL — ABNORMAL LOW (ref 11.6–15.9)
LYMPH%: 23 % (ref 14.0–49.7)
MCH: 27.7 pg (ref 25.1–34.0)
MCHC: 32.2 g/dL (ref 31.5–36.0)
MCV: 85.9 fL (ref 79.5–101.0)
MONO#: 0.3 10*3/uL (ref 0.1–0.9)
MONO%: 10.6 % (ref 0.0–14.0)
NEUT#: 1.5 10*3/uL (ref 1.5–6.5)
NEUT%: 59.9 % (ref 38.4–76.8)
Platelets: 136 10*3/uL — ABNORMAL LOW (ref 145–400)
RBC: 3.45 10*6/uL — AB (ref 3.70–5.45)
RDW: 20.5 % — ABNORMAL HIGH (ref 11.2–14.5)
WBC: 2.5 10*3/uL — AB (ref 3.9–10.3)
lymph#: 0.6 10*3/uL — ABNORMAL LOW (ref 0.9–3.3)

## 2016-05-08 MED ORDER — ONDANSETRON HCL 8 MG PO TABS
8.0000 mg | ORAL_TABLET | Freq: Once | ORAL | Status: AC
  Administered 2016-05-08: 8 mg via ORAL

## 2016-05-08 MED ORDER — BORTEZOMIB CHEMO SQ INJECTION 3.5 MG (2.5MG/ML)
1.3000 mg/m2 | Freq: Once | INTRAMUSCULAR | Status: AC
  Administered 2016-05-08: 3 mg via SUBCUTANEOUS
  Filled 2016-05-08: qty 3

## 2016-05-08 MED ORDER — ONDANSETRON HCL 8 MG PO TABS
ORAL_TABLET | ORAL | Status: AC
  Filled 2016-05-08: qty 1

## 2016-05-08 NOTE — Patient Instructions (Signed)
[facility] Discharge Instructions for Patients Receiving Chemotherapy  Today you received the following chemotherapy agent: Velcade   To help prevent nausea and vomiting after your treatment, we encourage you to take your nausea medication as prescribed.    If you develop nausea and vomiting that is not controlled by your nausea medication, call the clinic.   BELOW ARE SYMPTOMS THAT SHOULD BE REPORTED IMMEDIATELY:  *FEVER GREATER THAN 100.5 F  *CHILLS WITH OR WITHOUT FEVER  NAUSEA AND VOMITING THAT IS NOT CONTROLLED WITH YOUR NAUSEA MEDICATION  *UNUSUAL SHORTNESS OF BREATH  *UNUSUAL BRUISING OR BLEEDING  TENDERNESS IN MOUTH AND THROAT WITH OR WITHOUT PRESENCE OF ULCERS  *URINARY PROBLEMS  *BOWEL PROBLEMS  UNUSUAL RASH Items with * indicate a potential emergency and should be followed up as soon as possible.  Feel free to call the clinic you have any questions or concerns. The clinic phone number is [phone].  Please show the CHEMO ALERT CARD at check-in to the Emergency Department and triage nurse.   

## 2016-05-12 DIAGNOSIS — M4003 Postural kyphosis, cervicothoracic region: Secondary | ICD-10-CM | POA: Diagnosis not present

## 2016-05-12 DIAGNOSIS — M5412 Radiculopathy, cervical region: Secondary | ICD-10-CM | POA: Diagnosis not present

## 2016-05-12 DIAGNOSIS — M542 Cervicalgia: Secondary | ICD-10-CM | POA: Diagnosis not present

## 2016-05-15 ENCOUNTER — Ambulatory Visit (HOSPITAL_BASED_OUTPATIENT_CLINIC_OR_DEPARTMENT_OTHER): Payer: BLUE CROSS/BLUE SHIELD

## 2016-05-15 ENCOUNTER — Ambulatory Visit (HOSPITAL_BASED_OUTPATIENT_CLINIC_OR_DEPARTMENT_OTHER): Payer: BLUE CROSS/BLUE SHIELD | Admitting: Internal Medicine

## 2016-05-15 ENCOUNTER — Telehealth: Payer: Self-pay | Admitting: Internal Medicine

## 2016-05-15 ENCOUNTER — Encounter: Payer: Self-pay | Admitting: Internal Medicine

## 2016-05-15 ENCOUNTER — Other Ambulatory Visit (HOSPITAL_BASED_OUTPATIENT_CLINIC_OR_DEPARTMENT_OTHER): Payer: BLUE CROSS/BLUE SHIELD

## 2016-05-15 VITALS — BP 150/66 | HR 56 | Temp 98.1°F | Resp 20 | Ht 67.0 in | Wt 258.4 lb

## 2016-05-15 DIAGNOSIS — D6481 Anemia due to antineoplastic chemotherapy: Secondary | ICD-10-CM | POA: Diagnosis not present

## 2016-05-15 DIAGNOSIS — Z5112 Encounter for antineoplastic immunotherapy: Secondary | ICD-10-CM | POA: Diagnosis not present

## 2016-05-15 DIAGNOSIS — C9000 Multiple myeloma not having achieved remission: Secondary | ICD-10-CM

## 2016-05-15 DIAGNOSIS — R530 Neoplastic (malignant) related fatigue: Secondary | ICD-10-CM

## 2016-05-15 LAB — CBC WITH DIFFERENTIAL/PLATELET
BASO%: 2 % (ref 0.0–2.0)
Basophils Absolute: 0.1 10*3/uL (ref 0.0–0.1)
EOS%: 1.3 % (ref 0.0–7.0)
Eosinophils Absolute: 0 10*3/uL (ref 0.0–0.5)
HCT: 27 % — ABNORMAL LOW (ref 34.8–46.6)
HGB: 8.8 g/dL — ABNORMAL LOW (ref 11.6–15.9)
LYMPH#: 0.7 10*3/uL — AB (ref 0.9–3.3)
LYMPH%: 21.5 % (ref 14.0–49.7)
MCH: 27.9 pg (ref 25.1–34.0)
MCHC: 32.5 g/dL (ref 31.5–36.0)
MCV: 85.7 fL (ref 79.5–101.0)
MONO#: 0.4 10*3/uL (ref 0.1–0.9)
MONO%: 13.1 % (ref 0.0–14.0)
NEUT%: 62.1 % (ref 38.4–76.8)
NEUTROS ABS: 2.1 10*3/uL (ref 1.5–6.5)
PLATELETS: 128 10*3/uL — AB (ref 145–400)
RBC: 3.15 10*6/uL — AB (ref 3.70–5.45)
RDW: 19.9 % — ABNORMAL HIGH (ref 11.2–14.5)
WBC: 3.3 10*3/uL — AB (ref 3.9–10.3)

## 2016-05-15 LAB — COMPREHENSIVE METABOLIC PANEL
ALT: 30 U/L (ref 0–55)
AST: 28 U/L (ref 5–34)
Albumin: 3.5 g/dL (ref 3.5–5.0)
Alkaline Phosphatase: 103 U/L (ref 40–150)
Anion Gap: 8 mEq/L (ref 3–11)
BUN: 7.9 mg/dL (ref 7.0–26.0)
CHLORIDE: 109 meq/L (ref 98–109)
CO2: 24 mEq/L (ref 22–29)
Calcium: 9 mg/dL (ref 8.4–10.4)
Creatinine: 0.7 mg/dL (ref 0.6–1.1)
GLUCOSE: 110 mg/dL (ref 70–140)
POTASSIUM: 3.8 meq/L (ref 3.5–5.1)
SODIUM: 140 meq/L (ref 136–145)
Total Bilirubin: 0.57 mg/dL (ref 0.20–1.20)
Total Protein: 6.3 g/dL — ABNORMAL LOW (ref 6.4–8.3)

## 2016-05-15 MED ORDER — ONDANSETRON HCL 8 MG PO TABS
8.0000 mg | ORAL_TABLET | Freq: Once | ORAL | Status: AC
  Administered 2016-05-15: 8 mg via ORAL

## 2016-05-15 MED ORDER — ONDANSETRON HCL 8 MG PO TABS
ORAL_TABLET | ORAL | Status: AC
  Filled 2016-05-15: qty 1

## 2016-05-15 MED ORDER — BORTEZOMIB CHEMO SQ INJECTION 3.5 MG (2.5MG/ML)
1.3000 mg/m2 | Freq: Once | INTRAMUSCULAR | Status: AC
  Administered 2016-05-15: 3 mg via SUBCUTANEOUS
  Filled 2016-05-15: qty 3

## 2016-05-15 NOTE — Telephone Encounter (Signed)
Labs and Chemo scheduled weekly per 05/15/16 los. Patient was given a copyof the AVS report and appointment schedule per 05/15/16 los.

## 2016-05-15 NOTE — Progress Notes (Signed)
[facility] [phone]   [phone]  OFFICE PROGRESS NOTE  Brenda Kern, MD [place] [address]  DIAGNOSIS: Plasma cell dyscrasia initially diagnosed as MGUS in September 2010, with additional symptoms suggestive of POEMS syndrome.   PRIOR THERAPY:  1) Velcade 1.3 MG/M2 subcutaneously with Decadron 40 mg by mouth on a weekly basis. First cycle 11/24/2013. She status post 31 weekly doses of treatment. 2) Velcade 1.3 MG/M2 subcutaneously and weekly basis with Decadron 40 mg by mouth weekly. First dose 02/01/2015. Status post 28 cycles. 3) Revlimid 25 mg by mouth daily for 21 days every 4 weeks with weekly Decadron 20 mg. started in 11/27/2015. Status post 3 cycles discontinued secondary to lack of response.  CURRENT THERAPY: Systemic treatment with Velcade 1.3 MG/KG weekly, Revlimid 25 mg by mouth daily for 21 days every 4 weeks in addition to Decadron 20 mg by mouth weekly. First dose 03/06/2016. Status post 3 cycles. She is starting cycle #4 on 05/16/2016.  INTERVAL HISTORY: Brenda Conner 56 y.o. female returns to the clinic today for follow-up visit. The patient spent a [place] at the beach and she enjoyed her time there. She is feeling fine today except for fatigue and shortness of breath with exertion. She denied having any bleeding issues. She denied having any chest pain, cough or hemoptysis. She has no fever or chills. She denied having any nausea, vomiting, diarrhea or constipation. She has mild swelling in her lower extremities. Her blood pressure is elevated today but she did not take her medication earlier before the visit. She is here today for evaluation and repeat blood work.  MEDICAL HISTORY: Past Medical History:  Diagnosis Date  . Acid reflux   . Anxiety   . Asthma   . Cancer [facility])    waldenstroms/ macroglobinulemia  . Depression   . Depression   . Diabetes mellitus without complication ([place])   . Dysuria  02/28/2016  . Hypercholesteremia   . Hypertension   . Hypothyroidism   . Macroglobulinemia ([place])    ? POEMS syndrome  . Multiple myeloma ([place])   . Obesity   . PONV (postoperative nausea and vomiting)   . Sleep apnea    CPAP at bedtime  . Sleep apnea     ALLERGIES:  is allergic to codeine; hydrocodone; lortab [hydrocodone-acetaminophen]; onion; shellfish allergy; and amoxicillin.  MEDICATIONS:  Current Outpatient Prescriptions  Medication Sig Dispense Refill  . acyclovir (ZOVIRAX) 400 MG tablet Take 1 tablet (400 mg total) by mouth 2 (two) times daily. 180 tablet 0  . ALPRAZolam (XANAX) 0.5 MG tablet Take 1 tablet (0.5 mg total) by mouth at bedtime as needed for anxiety. 30 tablet 0  . aspirin 81 MG tablet Take 81 mg by mouth daily.    . Azelastine HCl 0.15 % SOLN as needed.    . B-D UF III MINI PEN NEEDLES 31G X 5 MM MISC     . Blood Glucose Monitoring Suppl (ONE TOUCH ULTRA MINI) W/DEVICE KIT See admin instructions. Reported on 01/25/2015  0  . Cetirizine HCl (ZYRTEC ALLERGY PO) Take by mouth daily.    [name] dexamethasone (DECADRON) 4 MG tablet 5 tablet by mouth every week. Start with the first dose of chemotherapy. 40 tablet 3  . DiphenhydrAMINE HCl (BENADRYL ALLERGY PO) Take by mouth as needed.    [name] EPIPEN 2-PAK 0.3 MG/0.3ML SOAJ injection Reported on 06/21/2015  1  . Fexofenadine HCl (ALLEGRA ALLERGY PO) Take  by mouth daily.    . Fluticasone-Salmeterol (ADVAIR) 100-50 MCG/DOSE AEPB Inhale 2 puffs into the lungs every 12 (twelve) hours.    [name] ibuprofen (ADVIL,MOTRIN) 100 MG tablet Take 100 mg by mouth every 6 (six) hours as needed. Reported on 05/31/2015    . insulin glargine (LANTUS) 100 UNIT/ML injection Inject 8 Units into the skin 2 (two) times a week. Reported on 02/18/2015    . levothyroxine (SYNTHROID, LEVOTHROID) 150 MCG tablet Take 150 mcg by mouth daily before breakfast.    . lisinopril (PRINIVIL,ZESTRIL) 10 MG tablet Take 10 mg by mouth daily.  3  . loperamide (IMODIUM) 2 MG  capsule Take 2 mg by mouth as needed for diarrhea or loose stools.    . metFORMIN (GLUCOPHAGE) 1000 MG tablet Take 1,000 mg by mouth 2 (two) times daily.  3  . NOVOLOG FLEXPEN 100 UNIT/ML FlexPen 3 Units daily as needed. Reported on 01/25/2015  6  . omeprazole (PRILOSEC) 40 MG capsule Take 40 mg by mouth 2 (two) times daily.     . ondansetron (ZOFRAN-ODT) 8 MG disintegrating tablet Take 1 tablet (8 mg total) by mouth every 8 (eight) hours as needed. Reported on 05/31/2015 20 tablet 2  . ONE TOUCH ULTRA TEST test strip See admin instructions. Reported on 01/25/2015  11  . pravastatin (PRAVACHOL) 40 MG tablet Take 40 mg by mouth every evening.     [name] PROAIR HFA 108 (90 Base) MCG/ACT inhaler Reported on 06/21/2015  1  . REVLIMID 25 MG capsule TAKE 1 CAPSULE ('25MG'$ ) BY MOUTH ONCE DAILY 21 DAYS ON AND 7 DAYS OFF. Adult femal not of childbearing potential. Auth number 04/30/16- [id number] 21 capsule 2  . sertraline (ZOLOFT) 50 MG tablet TAKE 3 TABLETS BY MOUTH EVERY DAY 270 tablet 1  . verapamil (CALAN-SR) 240 MG CR tablet Take 240 mg by mouth 2 (two) times daily.    [name] warfarin (COUMADIN) 2 MG tablet Take 1 tablet (2 mg total) by mouth daily. 90 tablet 0   No current facility-administered medications for this visit.     SURGICAL HISTORY:  Past Surgical History:  Procedure Laterality Date  . ABLATION  09/2007   HTA and polyp resection  . BACK SURGERY  03/2004   herniation, L4-L5  . Bil Laprascopic knee surgery    . BONE MARROW BIOPSY     2011  . BONE MARROW BIOPSY  4/14  . CHOLECYSTECTOMY    . FOOT SURGERY  1998  . KNEE ARTHROSCOPY W/ MENISCAL REPAIR  10/10 ; 3/11  . LAPAROSCOPIC CHOLECYSTECTOMY  1997  . NASAL SINUS SURGERY  2004  . UTERINE FIBROID EMBOLIZATION      REVIEW OF SYSTEMS:  A comprehensive review of systems was negative except for: Constitutional: positive for fatigue Respiratory: positive for dyspnea on exertion   PHYSICAL EXAMINATION: General appearance: alert, cooperative, fatigued  and no distress Head: Normocephalic, without obvious abnormality, atraumatic Neck: no adenopathy Lymph nodes: Cervical, supraclavicular, and axillary nodes normal. Resp: clear to auscultation bilaterally Back: symmetric, no curvature. ROM normal. No CVA tenderness. Cardio: regular rate and rhythm, S1, S2 normal, no murmur, click, rub or gallop GI: soft, non-tender; bowel sounds normal; no masses,  no organomegaly Extremities: extremities normal, atraumatic, no cyanosis or edema  ECOG PERFORMANCE STATUS: 1 - Symptomatic but completely ambulatory  Blood pressure (!) 150/66, pulse (!) 56, temperature 98.1 F (36.7 C), temperature source Oral, resp. rate 20, height '5\' 7"'$  (1.702 m), weight 258 lb 6.4 oz (117.2 kg), SpO2  100 %.  LABORATORY DATA: Lab Results  Component Value Date   WBC 3.3 (L) 05/15/2016   HGB 8.8 (L) 05/15/2016   HCT 27.0 (L) 05/15/2016   MCV 85.7 05/15/2016   PLT 128 (L) 05/15/2016      Chemistry      Component Value Date/Time   NA 140 05/08/2016 0843   K 3.9 05/08/2016 0843   CL 104 04/07/2012 1415   CO2 26 05/08/2016 0843   BUN 9.2 05/08/2016 0843   CREATININE 0.7 05/08/2016 0843      Component Value Date/Time   CALCIUM 9.3 05/08/2016 0843   ALKPHOS 109 05/08/2016 0843   AST 37 (H) 05/08/2016 0843   ALT 43 05/08/2016 0843   BILITOT 0.64 05/08/2016 0843      ASSESSMENT AND PLAN:  This is a very pleasant 56 years old white female with multiple myeloma status post several chemotherapy regimens and she is currently on treatment with weekly subcutaneous Velcade, Revlimid 25 mg by mouth daily for 21 days every 4 weeks in addition to weekly Decadron status post 4 cycles and has been tolerating the treatment well except for fatigue likely secondary to chemotherapy-induced anemia. I recommended for the patient to continue her treatment as scheduled. She will proceed with cycle #5 as a scheduled today. I will see her back for follow-up visit in 4 weeks for  evaluation and repeat myeloma panel. For the anemia, I recommended for the patient to start taking multivitamins including iron supplements. She was advised to call immediately if she has any concerning symptoms in the interval. All questions were answered. The patient knows to call the clinic with any problems, questions or concerns. We can certainly see the patient much sooner if necessary. I spent 10 minutes counseling the patient face to face. The total time spent in the appointment was 15 minutes.  Disclaimer: This note was dictated with voice recognition software. Similar sounding words can inadvertently be transcribed and may not be corrected upon review.

## 2016-05-15 NOTE — Patient Instructions (Signed)
[facility] Discharge Instructions for Patients Receiving Chemotherapy  Today you received the following chemotherapy agents Velcade. To help prevent nausea and vomiting after your treatment, we encourage you to take your nausea medication as directed.  If you develop nausea and vomiting that is not controlled by your nausea medication, call the clinic.   BELOW ARE SYMPTOMS THAT SHOULD BE REPORTED IMMEDIATELY:  *FEVER GREATER THAN 100.5 F  *CHILLS WITH OR WITHOUT FEVER  NAUSEA AND VOMITING THAT IS NOT CONTROLLED WITH YOUR NAUSEA MEDICATION  *UNUSUAL SHORTNESS OF BREATH  *UNUSUAL BRUISING OR BLEEDING  TENDERNESS IN MOUTH AND THROAT WITH OR WITHOUT PRESENCE OF ULCERS  *URINARY PROBLEMS  *BOWEL PROBLEMS  UNUSUAL RASH Items with * indicate a potential emergency and should be followed up as soon as possible.  Feel free to call the clinic you have any questions or concerns. The clinic phone number is [phone].  Please show the CHEMO ALERT CARD at check-in to the Emergency Department and triage nurse.    

## 2016-05-22 ENCOUNTER — Ambulatory Visit (HOSPITAL_BASED_OUTPATIENT_CLINIC_OR_DEPARTMENT_OTHER): Payer: BLUE CROSS/BLUE SHIELD

## 2016-05-22 ENCOUNTER — Other Ambulatory Visit (HOSPITAL_BASED_OUTPATIENT_CLINIC_OR_DEPARTMENT_OTHER): Payer: BLUE CROSS/BLUE SHIELD

## 2016-05-22 DIAGNOSIS — Z5112 Encounter for antineoplastic immunotherapy: Secondary | ICD-10-CM | POA: Diagnosis not present

## 2016-05-22 DIAGNOSIS — C9000 Multiple myeloma not having achieved remission: Secondary | ICD-10-CM | POA: Diagnosis not present

## 2016-05-22 LAB — CBC WITH DIFFERENTIAL/PLATELET
BASO%: 0.4 % (ref 0.0–2.0)
BASOS ABS: 0 10*3/uL (ref 0.0–0.1)
EOS%: 0.2 % (ref 0.0–7.0)
Eosinophils Absolute: 0 10*3/uL (ref 0.0–0.5)
HCT: 31.2 % — ABNORMAL LOW (ref 34.8–46.6)
HGB: 10.2 g/dL — ABNORMAL LOW (ref 11.6–15.9)
LYMPH%: 5.4 % — AB (ref 14.0–49.7)
MCH: 28.1 pg (ref 25.1–34.0)
MCHC: 32.6 g/dL (ref 31.5–36.0)
MCV: 86.2 fL (ref 79.5–101.0)
MONO#: 0 10*3/uL — ABNORMAL LOW (ref 0.1–0.9)
MONO%: 0.9 % (ref 0.0–14.0)
NEUT#: 4.9 10*3/uL (ref 1.5–6.5)
NEUT%: 93.1 % — ABNORMAL HIGH (ref 38.4–76.8)
Platelets: 165 10*3/uL (ref 145–400)
RBC: 3.62 10*6/uL — AB (ref 3.70–5.45)
RDW: 19.8 % — ABNORMAL HIGH (ref 11.2–14.5)
WBC: 5.3 10*3/uL (ref 3.9–10.3)
lymph#: 0.3 10*3/uL — ABNORMAL LOW (ref 0.9–3.3)

## 2016-05-22 LAB — COMPREHENSIVE METABOLIC PANEL
ALK PHOS: 113 U/L (ref 40–150)
ALT: 39 U/L (ref 0–55)
AST: 36 U/L — ABNORMAL HIGH (ref 5–34)
Albumin: 4 g/dL (ref 3.5–5.0)
Anion Gap: 19 mEq/L — ABNORMAL HIGH (ref 3–11)
BUN: 8.1 mg/dL (ref 7.0–26.0)
CO2: 16 meq/L — AB (ref 22–29)
Calcium: 9.5 mg/dL (ref 8.4–10.4)
Chloride: 104 mEq/L (ref 98–109)
Creatinine: 0.9 mg/dL (ref 0.6–1.1)
EGFR: 68 mL/min/{1.73_m2} — AB (ref >90)
GLUCOSE: 265 mg/dL — AB (ref 70–140)
POTASSIUM: 4.2 meq/L (ref 3.5–5.1)
SODIUM: 139 meq/L (ref 136–145)
Total Bilirubin: 0.79 mg/dL (ref 0.20–1.20)
Total Protein: 7.2 g/dL (ref 6.4–8.3)

## 2016-05-22 MED ORDER — ONDANSETRON HCL 8 MG PO TABS
8.0000 mg | ORAL_TABLET | Freq: Once | ORAL | Status: AC
  Administered 2016-05-22: 8 mg via ORAL

## 2016-05-22 MED ORDER — BORTEZOMIB CHEMO SQ INJECTION 3.5 MG (2.5MG/ML)
1.3000 mg/m2 | Freq: Once | INTRAMUSCULAR | Status: AC
  Administered 2016-05-22: 3 mg via SUBCUTANEOUS
  Filled 2016-05-22: qty 3

## 2016-05-22 MED ORDER — ONDANSETRON HCL 8 MG PO TABS
ORAL_TABLET | ORAL | Status: AC
Start: 2016-05-22 — End: 2016-05-22
  Filled 2016-05-22: qty 1

## 2016-05-22 NOTE — Patient Instructions (Signed)
[facility] Discharge Instructions for Patients Receiving Chemotherapy  Today you received the following chemotherapy agents Velcade  To help prevent nausea and vomiting after your treatment, we encourage you to take your nausea medication    If you develop nausea and vomiting that is not controlled by your nausea medication, call the clinic.   BELOW ARE SYMPTOMS THAT SHOULD BE REPORTED IMMEDIATELY:  *FEVER GREATER THAN 100.5 F  *CHILLS WITH OR WITHOUT FEVER  NAUSEA AND VOMITING THAT IS NOT CONTROLLED WITH YOUR NAUSEA MEDICATION  *UNUSUAL SHORTNESS OF BREATH  *UNUSUAL BRUISING OR BLEEDING  TENDERNESS IN MOUTH AND THROAT WITH OR WITHOUT PRESENCE OF ULCERS  *URINARY PROBLEMS  *BOWEL PROBLEMS  UNUSUAL RASH Items with * indicate a potential emergency and should be followed up as soon as possible.  Feel free to call the clinic you have any questions or concerns. The clinic phone number is [phone].  Please show the CHEMO ALERT CARD at check-in to the Emergency Department and triage nurse.   

## 2016-05-26 DIAGNOSIS — M4003 Postural kyphosis, cervicothoracic region: Secondary | ICD-10-CM | POA: Diagnosis not present

## 2016-05-26 DIAGNOSIS — M542 Cervicalgia: Secondary | ICD-10-CM | POA: Diagnosis not present

## 2016-05-26 DIAGNOSIS — M5412 Radiculopathy, cervical region: Secondary | ICD-10-CM | POA: Diagnosis not present

## 2016-05-28 ENCOUNTER — Other Ambulatory Visit: Payer: Self-pay | Admitting: Internal Medicine

## 2016-05-28 ENCOUNTER — Other Ambulatory Visit: Payer: Self-pay | Admitting: Medical Oncology

## 2016-05-28 DIAGNOSIS — C9000 Multiple myeloma not having achieved remission: Secondary | ICD-10-CM

## 2016-05-29 ENCOUNTER — Other Ambulatory Visit (HOSPITAL_BASED_OUTPATIENT_CLINIC_OR_DEPARTMENT_OTHER): Payer: BLUE CROSS/BLUE SHIELD

## 2016-05-29 ENCOUNTER — Ambulatory Visit (HOSPITAL_BASED_OUTPATIENT_CLINIC_OR_DEPARTMENT_OTHER): Payer: BLUE CROSS/BLUE SHIELD

## 2016-05-29 VITALS — BP 117/68 | HR 72 | Temp 98.3°F | Resp 18

## 2016-05-29 DIAGNOSIS — D472 Monoclonal gammopathy: Secondary | ICD-10-CM

## 2016-05-29 DIAGNOSIS — Z5112 Encounter for antineoplastic immunotherapy: Secondary | ICD-10-CM

## 2016-05-29 DIAGNOSIS — C9000 Multiple myeloma not having achieved remission: Secondary | ICD-10-CM

## 2016-05-29 LAB — CBC WITH DIFFERENTIAL/PLATELET
BASO%: 0.5 % (ref 0.0–2.0)
BASOS ABS: 0 10*3/uL (ref 0.0–0.1)
EOS ABS: 0 10*3/uL (ref 0.0–0.5)
EOS%: 0.4 % (ref 0.0–7.0)
HEMATOCRIT: 32.4 % — AB (ref 34.8–46.6)
HGB: 10.5 g/dL — ABNORMAL LOW (ref 11.6–15.9)
LYMPH#: 0.4 10*3/uL — AB (ref 0.9–3.3)
LYMPH%: 6.4 % — ABNORMAL LOW (ref 14.0–49.7)
MCH: 28 pg (ref 25.1–34.0)
MCHC: 32.5 g/dL (ref 31.5–36.0)
MCV: 86.1 fL (ref 79.5–101.0)
MONO#: 0.1 10*3/uL (ref 0.1–0.9)
MONO%: 2.2 % (ref 0.0–14.0)
NEUT#: 5.5 10*3/uL (ref 1.5–6.5)
NEUT%: 90.5 % — ABNORMAL HIGH (ref 38.4–76.8)
Platelets: 152 10*3/uL (ref 145–400)
RBC: 3.76 10*6/uL (ref 3.70–5.45)
RDW: 19.9 % — ABNORMAL HIGH (ref 11.2–14.5)
WBC: 6.1 10*3/uL (ref 3.9–10.3)

## 2016-05-29 LAB — COMPREHENSIVE METABOLIC PANEL
ALT: 52 U/L (ref 0–55)
ANION GAP: 15 meq/L — AB (ref 3–11)
AST: 51 U/L — ABNORMAL HIGH (ref 5–34)
Albumin: 4 g/dL (ref 3.5–5.0)
Alkaline Phosphatase: 127 U/L (ref 40–150)
BUN: 8.1 mg/dL (ref 7.0–26.0)
CO2: 17 mEq/L — ABNORMAL LOW (ref 22–29)
Calcium: 9.5 mg/dL (ref 8.4–10.4)
Chloride: 104 mEq/L (ref 98–109)
Creatinine: 0.9 mg/dL (ref 0.6–1.1)
EGFR: 68 mL/min/{1.73_m2} — ABNORMAL LOW (ref >90)
Glucose: 263 mg/dl — ABNORMAL HIGH (ref 70–140)
Potassium: 3.9 mEq/L (ref 3.5–5.1)
Sodium: 136 mEq/L (ref 136–145)
Total Bilirubin: 0.76 mg/dL (ref 0.20–1.20)
Total Protein: 7.2 g/dL (ref 6.4–8.3)

## 2016-05-29 MED ORDER — BORTEZOMIB CHEMO SQ INJECTION 3.5 MG (2.5MG/ML)
1.3000 mg/m2 | Freq: Once | INTRAMUSCULAR | Status: AC
  Administered 2016-05-29: 3 mg via SUBCUTANEOUS
  Filled 2016-05-29: qty 3

## 2016-05-29 MED ORDER — ONDANSETRON HCL 8 MG PO TABS
8.0000 mg | ORAL_TABLET | Freq: Once | ORAL | Status: AC
  Administered 2016-05-29: 8 mg via ORAL

## 2016-05-29 MED ORDER — ONDANSETRON HCL 8 MG PO TABS
ORAL_TABLET | ORAL | Status: AC
  Filled 2016-05-29: qty 1

## 2016-05-29 NOTE — Patient Instructions (Signed)
[facility] Discharge Instructions for Patients Receiving Chemotherapy  Today you received the following chemotherapy agents Velcade. To help prevent nausea and vomiting after your treatment, we encourage you to take your nausea medication as directed.  If you develop nausea and vomiting that is not controlled by your nausea medication, call the clinic.   BELOW ARE SYMPTOMS THAT SHOULD BE REPORTED IMMEDIATELY:  *FEVER GREATER THAN 100.5 F  *CHILLS WITH OR WITHOUT FEVER  NAUSEA AND VOMITING THAT IS NOT CONTROLLED WITH YOUR NAUSEA MEDICATION  *UNUSUAL SHORTNESS OF BREATH  *UNUSUAL BRUISING OR BLEEDING  TENDERNESS IN MOUTH AND THROAT WITH OR WITHOUT PRESENCE OF ULCERS  *URINARY PROBLEMS  *BOWEL PROBLEMS  UNUSUAL RASH Items with * indicate a potential emergency and should be followed up as soon as possible.  Feel free to call the clinic you have any questions or concerns. The clinic phone number is [phone].  Please show the CHEMO ALERT CARD at check-in to the Emergency Department and triage nurse.    

## 2016-06-01 ENCOUNTER — Other Ambulatory Visit: Payer: Self-pay | Admitting: Medical Oncology

## 2016-06-01 DIAGNOSIS — C9000 Multiple myeloma not having achieved remission: Secondary | ICD-10-CM

## 2016-06-01 MED ORDER — REVLIMID 25 MG PO CAPS: 25.0000 mg | ORAL_CAPSULE | Freq: Every day | ORAL | 0 refills | Status: DC

## 2016-06-05 ENCOUNTER — Other Ambulatory Visit (HOSPITAL_COMMUNITY)
Admission: RE | Admit: 2016-06-05 | Discharge: 2016-06-05 | Disposition: A | Discharge status: Home / Self Care | Payer: BLUE CROSS/BLUE SHIELD | Source: Ambulatory Visit | Attending: Obstetrics & Gynecology | Admitting: Obstetrics & Gynecology

## 2016-06-05 ENCOUNTER — Ambulatory Visit (INDEPENDENT_AMBULATORY_CARE_PROVIDER_SITE_OTHER): Payer: BLUE CROSS/BLUE SHIELD | Admitting: Obstetrics & Gynecology

## 2016-06-05 ENCOUNTER — Other Ambulatory Visit (HOSPITAL_BASED_OUTPATIENT_CLINIC_OR_DEPARTMENT_OTHER): Payer: BLUE CROSS/BLUE SHIELD

## 2016-06-05 ENCOUNTER — Ambulatory Visit (HOSPITAL_BASED_OUTPATIENT_CLINIC_OR_DEPARTMENT_OTHER): Payer: BLUE CROSS/BLUE SHIELD

## 2016-06-05 ENCOUNTER — Encounter: Payer: Self-pay | Admitting: Obstetrics & Gynecology

## 2016-06-05 VITALS — BP 110/60 | HR 72 | Resp 16 | Ht 66.75 in | Wt 255.0 lb

## 2016-06-05 DIAGNOSIS — C9000 Multiple myeloma not having achieved remission: Secondary | ICD-10-CM

## 2016-06-05 DIAGNOSIS — Z01419 Encounter for gynecological examination (general) (routine) without abnormal findings: Secondary | ICD-10-CM | POA: Insufficient documentation

## 2016-06-05 DIAGNOSIS — Z124 Encounter for screening for malignant neoplasm of cervix: Secondary | ICD-10-CM

## 2016-06-05 DIAGNOSIS — Z5112 Encounter for antineoplastic immunotherapy: Secondary | ICD-10-CM

## 2016-06-05 DIAGNOSIS — Z01411 Encounter for gynecological examination (general) (routine) with abnormal findings: Secondary | ICD-10-CM

## 2016-06-05 LAB — COMPREHENSIVE METABOLIC PANEL
ALBUMIN: 3.5 g/dL (ref 3.5–5.0)
ALT: 49 U/L (ref 0–55)
AST: 46 U/L — ABNORMAL HIGH (ref 5–34)
Alkaline Phosphatase: 109 U/L (ref 40–150)
Anion Gap: 12 mEq/L — ABNORMAL HIGH (ref 3–11)
BILIRUBIN TOTAL: 0.6 mg/dL (ref 0.20–1.20)
BUN: 6.1 mg/dL — AB (ref 7.0–26.0)
CO2: 22 meq/L (ref 22–29)
CREATININE: 0.7 mg/dL (ref 0.6–1.1)
Calcium: 8.8 mg/dL (ref 8.4–10.4)
Chloride: 107 mEq/L (ref 98–109)
GLUCOSE: 123 mg/dL (ref 70–140)
Potassium: 3.3 mEq/L — ABNORMAL LOW (ref 3.5–5.1)
SODIUM: 141 meq/L (ref 136–145)
TOTAL PROTEIN: 6.3 g/dL — AB (ref 6.4–8.3)

## 2016-06-05 LAB — LACTATE DEHYDROGENASE: LDH: 138 U/L (ref 125–245)

## 2016-06-05 LAB — CBC WITH DIFFERENTIAL/PLATELET
BASO%: 1.6 % (ref 0.0–2.0)
Basophils Absolute: 0 10*3/uL (ref 0.0–0.1)
EOS%: 6.1 % (ref 0.0–7.0)
Eosinophils Absolute: 0.1 10*3/uL (ref 0.0–0.5)
HCT: 28.2 % — ABNORMAL LOW (ref 34.8–46.6)
HEMOGLOBIN: 9.2 g/dL — AB (ref 11.6–15.9)
LYMPH%: 18.5 % (ref 14.0–49.7)
MCH: 28.1 pg (ref 25.1–34.0)
MCHC: 32.6 g/dL (ref 31.5–36.0)
MCV: 86.1 fL (ref 79.5–101.0)
MONO#: 0.3 10*3/uL (ref 0.1–0.9)
MONO%: 10.7 % (ref 0.0–14.0)
NEUT%: 63.1 % (ref 38.4–76.8)
NEUTROS ABS: 1.5 10*3/uL (ref 1.5–6.5)
Platelets: 100 10*3/uL — ABNORMAL LOW (ref 145–400)
RBC: 3.28 10*6/uL — AB (ref 3.70–5.45)
RDW: 19.9 % — AB (ref 11.2–14.5)
WBC: 2.4 10*3/uL — AB (ref 3.9–10.3)
lymph#: 0.4 10*3/uL — ABNORMAL LOW (ref 0.9–3.3)

## 2016-06-05 MED ORDER — ALPRAZOLAM 0.5 MG PO TABS: 0.5000 mg | ORAL_TABLET | Freq: Every evening | ORAL | 0 refills | Status: DC | PRN

## 2016-06-05 MED ORDER — ONDANSETRON HCL 8 MG PO TABS
8.0000 mg | ORAL_TABLET | Freq: Once | ORAL | Status: AC
  Administered 2016-06-05: 8 mg via ORAL

## 2016-06-05 MED ORDER — ONDANSETRON HCL 8 MG PO TABS
ORAL_TABLET | ORAL | Status: AC
  Filled 2016-06-05: qty 1

## 2016-06-05 MED ORDER — BORTEZOMIB CHEMO SQ INJECTION 3.5 MG (2.5MG/ML)
1.3000 mg/m2 | Freq: Once | INTRAMUSCULAR | Status: AC
  Administered 2016-06-05: 3 mg via SUBCUTANEOUS
  Filled 2016-06-05: qty 3

## 2016-06-05 NOTE — Progress Notes (Signed)
56 y.o. G0P0000 SingleCaucasianF here for annual exam.  Being treated now with iv and oral medication for multiple myeloma.  Followed by Dr. Julien Nordmann.  Denies vaginal bleeding.    Last HbA1C was 6.7.  Seeing Dr. Chalmers Cater.   Patient's last menstrual period was 01/14/2016.          Sexually active: No.  The current method of family planning is ablation, abstinence.    Exercising: No.  The patient does not participate in regular exercise at present. Smoker:  no  Health Maintenance: Pap:  02/18/15 Neg. HR HPV:neg   12/21/13 Neg  History of abnormal Pap:  no MMG:  09/15/14 BIRADS1:Neg. Pt knows she is due Colonoscopy:  09/06/08 Normal  BMD:   2006 TDaP:  12/2014  Pneumonia vaccine(s):  No Zostavax:   No Hep C testing: Unsure Screening Labs: PCP   reports that she has never smoked. She has never used smokeless tobacco. She reports that she does not drink alcohol or use drugs.  Past Medical History:  Diagnosis Date  . Acid reflux   . Anxiety   . Asthma   . Cancer [facility])    waldenstroms/ macroglobinulemia  . Depression   . Depression   . Diabetes mellitus without complication ([place])   . Dysuria 02/28/2016  . Hypercholesteremia   . Hypertension   . Hypothyroidism   . Macroglobulinemia ([place])    ? POEMS syndrome  . Multiple myeloma ([place])   . Obesity   . PONV (postoperative nausea and vomiting)   . Sleep apnea    CPAP at bedtime  . Sleep apnea     Past Surgical History:  Procedure Laterality Date  . ABLATION  09/2007   HTA and polyp resection  . BACK SURGERY  03/2004   herniation, L4-L5  . Bil Laprascopic knee surgery    . BONE MARROW BIOPSY     2011  . BONE MARROW BIOPSY  4/14  . CHOLECYSTECTOMY    . FOOT SURGERY  1998  . KNEE ARTHROSCOPY W/ MENISCAL REPAIR  10/10 ; 3/11  . LAPAROSCOPIC CHOLECYSTECTOMY  1997  . NASAL SINUS SURGERY  2004  . UTERINE FIBROID EMBOLIZATION      Current Outpatient Prescriptions  Medication Sig Dispense Refill  . acyclovir (ZOVIRAX) 400 MG  tablet Take 1 tablet (400 mg total) by mouth 2 (two) times daily. 180 tablet 0  . ALPRAZolam (XANAX) 0.5 MG tablet Take 1 tablet (0.5 mg total) by mouth at bedtime as needed for anxiety. 30 tablet 0  . Azelastine HCl 0.15 % SOLN as needed.    . B-D UF III MINI PEN NEEDLES 31G X 5 MM MISC     . Blood Glucose Monitoring Suppl (ONE TOUCH ULTRA MINI) W/DEVICE KIT See admin instructions. Reported on 01/25/2015  0  . Bortezomib (VELCADE IJ) Inject as directed once a week.    . Cetirizine HCl (ZYRTEC ALLERGY PO) Take by mouth daily.    [name] dexamethasone (DECADRON) 4 MG tablet 5 tablet by mouth every week. Start with the first dose of chemotherapy. 40 tablet 3  . DiphenhydrAMINE HCl (BENADRYL ALLERGY PO) Take by mouth as needed.    [name] EPIPEN 2-PAK 0.3 MG/0.3ML SOAJ injection Reported on 06/21/2015  1  . Fexofenadine HCl (ALLEGRA ALLERGY PO) Take by mouth daily.    . Fluticasone-Salmeterol (ADVAIR) 100-50 MCG/DOSE AEPB Inhale 2 puffs into the lungs every 12 (twelve) hours.    [name] ibuprofen (ADVIL,MOTRIN) 100 MG tablet Take 100 mg by mouth every 6 (  six) hours as needed. Reported on 05/31/2015    . insulin glargine (LANTUS) 100 UNIT/ML injection Inject 8 Units into the skin 2 (two) times a week. Reported on 02/18/2015    . levothyroxine (SYNTHROID, LEVOTHROID) 150 MCG tablet Take 150 mcg by mouth daily before breakfast.    . lisinopril (PRINIVIL,ZESTRIL) 10 MG tablet Take 10 mg by mouth daily.  3  . loperamide (IMODIUM) 2 MG capsule Take 2 mg by mouth as needed for diarrhea or loose stools.    . metFORMIN (GLUCOPHAGE) 1000 MG tablet Take 1,000 mg by mouth 2 (two) times daily.  3  . NOVOLOG FLEXPEN 100 UNIT/ML FlexPen 3 Units daily as needed. Reported on 01/25/2015  6  . omeprazole (PRILOSEC) 40 MG capsule Take 40 mg by mouth 2 (two) times daily.     . ondansetron (ZOFRAN-ODT) 8 MG disintegrating tablet Take 1 tablet (8 mg total) by mouth every 8 (eight) hours as needed. Reported on 05/31/2015 20 tablet 2  . ONE TOUCH  ULTRA TEST test strip See admin instructions. Reported on 01/25/2015  11  . pravastatin (PRAVACHOL) 40 MG tablet Take 40 mg by mouth every evening.     [name] PROAIR HFA 108 (90 Base) MCG/ACT inhaler Reported on 06/21/2015  1  . REVLIMID 25 MG capsule Take 1 capsule (25 mg total) by mouth daily. 21 capsule 0  . sertraline (ZOLOFT) 50 MG tablet TAKE 3 TABLETS BY MOUTH EVERY DAY 270 tablet 1  . verapamil (CALAN-SR) 240 MG CR tablet Take 240 mg by mouth 2 (two) times daily.    [name] warfarin (COUMADIN) 2 MG tablet Take 1 tablet (2 mg total) by mouth daily. 90 tablet 0   No current facility-administered medications for this visit.     Family History  Problem Relation Age of Onset  . Heart disease Mother        heart cath  . Asthma Mother   . Endometriosis Mother     ROS:  Pertinent items are noted in HPI.  Otherwise, a comprehensive ROS was negative.  Exam:   BP 110/60 (BP Location: Right Arm, Patient Position: Sitting, Cuff Size: Normal)   Pulse 72   Resp 16   Ht 5' 6.75" (1.695 m)   Wt 255 lb (115.7 kg)   LMP 01/14/2016   BMI 40.24 kg/m   Weight change: -12#   Height: 5' 6.75" (169.5 cm)  Ht Readings from Last 3 Encounters:  06/05/16 5' 6.75" (1.695 m)  05/15/16 5' 7"  (1.702 m)  04/16/16 5' 7"  (1.702 m)   General appearance: alert, cooperative and appears stated age Head: Normocephalic, without obvious abnormality, atraumatic Neck: no adenopathy, supple, symmetrical, trachea midline and thyroid normal to inspection and palpation Lungs: clear to auscultation bilaterally Breasts: normal appearance, no masses or tenderness Heart: regular rate and rhythm Abdomen: soft, non-tender; bowel sounds normal; no masses,  no organomegaly Extremities: extremities normal, atraumatic, no cyanosis or edema Skin: Skin color, texture, turgor normal. No rashes or lesions Lymph nodes: Cervical, supraclavicular, and axillary nodes normal. No abnormal inguinal nodes palpated Neurologic: Grossly  normal  Pelvic: External genitalia:  no lesions              Urethra:  normal appearing urethra with no masses, tenderness or lesions              Bartholins and Skenes: normal                 Vagina: normal appearing vagina with normal  color and discharge, no lesions              Cervix: no lesions              Pap taken: Yes.   Bimanual Exam:  Uterus:  normal size, contour, position, consistency, mobility, non-tender              Adnexa: normal adnexa and no mass, fullness, tenderness               Rectovaginal: Confirms               Anus:  normal sphincter tone, no lesions  Chaperone was present for exam.  A:  Well Woman with normal exam PMP, no HRT H/O HTA 9/09 Elevated lipids Multiple myeloma.  Followed by Dr. Julien Nordmann. Hypertension  P:   Mammogram guidelines reviewed.  She knows this is due. Will plan BMD with MMG this year.  Order will be sent. pap smear obtained today Rx for xanax 0.30m po x 1 prn.  Still has rx from last year.  New Rx given.  #30/0RF. return annually or prn

## 2016-06-05 NOTE — Patient Instructions (Signed)
[facility] Center Discharge Instructions for Patients Receiving Chemotherapy  Today you received the following chemotherapy agents VELCADE  To help prevent nausea and vomiting after your treatment, we encourage you to take your nausea medication as prescribed.   If you develop nausea and vomiting that is not controlled by your nausea medication, call the clinic.   BELOW ARE SYMPTOMS THAT SHOULD BE REPORTED IMMEDIATELY:  *FEVER GREATER THAN 100.5 F  *CHILLS WITH OR WITHOUT FEVER  NAUSEA AND VOMITING THAT IS NOT CONTROLLED WITH YOUR NAUSEA MEDICATION  *UNUSUAL SHORTNESS OF BREATH  *UNUSUAL BRUISING OR BLEEDING  TENDERNESS IN MOUTH AND THROAT WITH OR WITHOUT PRESENCE OF ULCERS  *URINARY PROBLEMS  *BOWEL PROBLEMS  UNUSUAL RASH Items with * indicate a potential emergency and should be followed up as soon as possible.  Feel free to call the clinic you have any questions or concerns. The clinic phone number is [phone].  Please show the CHEMO ALERT CARD at check-in to the Emergency Department and triage nurse.   

## 2016-06-06 DIAGNOSIS — D225 Melanocytic nevi of trunk: Secondary | ICD-10-CM | POA: Diagnosis not present

## 2016-06-06 DIAGNOSIS — L918 Other hypertrophic disorders of the skin: Secondary | ICD-10-CM | POA: Diagnosis not present

## 2016-06-06 DIAGNOSIS — D2271 Melanocytic nevi of right lower limb, including hip: Secondary | ICD-10-CM | POA: Diagnosis not present

## 2016-06-06 DIAGNOSIS — D1801 Hemangioma of skin and subcutaneous tissue: Secondary | ICD-10-CM | POA: Diagnosis not present

## 2016-06-06 LAB — IGG, IGA, IGM
IGG (IMMUNOGLOBIN G), SERUM: 323 mg/dL — AB (ref 700–1600)
IGM (IMMUNOGLOBIN M), SRM: 635 mg/dL — AB (ref 26–217)
IgA, Qn, Serum: 76 mg/dL — ABNORMAL LOW (ref 87–352)

## 2016-06-06 LAB — KAPPA/LAMBDA LIGHT CHAINS
IG KAPPA FREE LIGHT CHAIN: 11.5 mg/L (ref 3.3–19.4)
Ig Lambda Free Light Chain: 554.3 mg/L — ABNORMAL HIGH (ref 5.7–26.3)
Kappa/Lambda FluidC Ratio: 0.02 — ABNORMAL LOW (ref 0.26–1.65)

## 2016-06-06 LAB — BETA 2 MICROGLOBULIN, SERUM: BETA 2: 1.9 mg/L (ref 0.6–2.4)

## 2016-06-08 LAB — CYTOLOGY - PAP: DIAGNOSIS: NEGATIVE

## 2016-06-12 ENCOUNTER — Telehealth: Payer: Self-pay | Admitting: Internal Medicine

## 2016-06-12 ENCOUNTER — Ambulatory Visit (HOSPITAL_BASED_OUTPATIENT_CLINIC_OR_DEPARTMENT_OTHER): Payer: BLUE CROSS/BLUE SHIELD | Admitting: Internal Medicine

## 2016-06-12 ENCOUNTER — Ambulatory Visit (HOSPITAL_BASED_OUTPATIENT_CLINIC_OR_DEPARTMENT_OTHER): Payer: BLUE CROSS/BLUE SHIELD

## 2016-06-12 ENCOUNTER — Encounter: Payer: Self-pay | Admitting: Internal Medicine

## 2016-06-12 ENCOUNTER — Other Ambulatory Visit (HOSPITAL_BASED_OUTPATIENT_CLINIC_OR_DEPARTMENT_OTHER): Payer: BLUE CROSS/BLUE SHIELD

## 2016-06-12 VITALS — BP 136/58 | HR 73 | Temp 98.5°F | Resp 20 | Ht 66.75 in | Wt 258.6 lb

## 2016-06-12 DIAGNOSIS — C9000 Multiple myeloma not having achieved remission: Secondary | ICD-10-CM

## 2016-06-12 DIAGNOSIS — Z5111 Encounter for antineoplastic chemotherapy: Secondary | ICD-10-CM

## 2016-06-12 DIAGNOSIS — Z5112 Encounter for antineoplastic immunotherapy: Secondary | ICD-10-CM

## 2016-06-12 DIAGNOSIS — E8809 Other disorders of plasma-protein metabolism, not elsewhere classified: Secondary | ICD-10-CM

## 2016-06-12 DIAGNOSIS — D6481 Anemia due to antineoplastic chemotherapy: Secondary | ICD-10-CM | POA: Diagnosis not present

## 2016-06-12 LAB — CBC WITH DIFFERENTIAL/PLATELET
BASO%: 1.1 % (ref 0.0–2.0)
BASOS ABS: 0 10*3/uL (ref 0.0–0.1)
EOS%: 1.9 % (ref 0.0–7.0)
Eosinophils Absolute: 0.1 10*3/uL (ref 0.0–0.5)
HEMATOCRIT: 28.1 % — AB (ref 34.8–46.6)
HEMOGLOBIN: 8.6 g/dL — AB (ref 11.6–15.9)
LYMPH#: 0.7 10*3/uL — AB (ref 0.9–3.3)
LYMPH%: 20.2 % (ref 14.0–49.7)
MCH: 27.7 pg (ref 25.1–34.0)
MCHC: 30.6 g/dL — ABNORMAL LOW (ref 31.5–36.0)
MCV: 90.4 fL (ref 79.5–101.0)
MONO#: 0.5 10*3/uL (ref 0.1–0.9)
MONO%: 13.8 % (ref 0.0–14.0)
NEUT#: 2.3 10*3/uL (ref 1.5–6.5)
NEUT%: 63 % (ref 38.4–76.8)
Platelets: 124 10*3/uL — ABNORMAL LOW (ref 145–400)
RBC: 3.11 10*6/uL — ABNORMAL LOW (ref 3.70–5.45)
RDW: 18.5 % — AB (ref 11.2–14.5)
WBC: 3.6 10*3/uL — ABNORMAL LOW (ref 3.9–10.3)

## 2016-06-12 LAB — COMPREHENSIVE METABOLIC PANEL
ALBUMIN: 3.5 g/dL (ref 3.5–5.0)
ALK PHOS: 127 U/L (ref 40–150)
ALT: 39 U/L (ref 0–55)
AST: 32 U/L (ref 5–34)
Anion Gap: 9 mEq/L (ref 3–11)
BILIRUBIN TOTAL: 0.69 mg/dL (ref 0.20–1.20)
BUN: 8.5 mg/dL (ref 7.0–26.0)
CALCIUM: 9.2 mg/dL (ref 8.4–10.4)
CO2: 25 mEq/L (ref 22–29)
CREATININE: 0.7 mg/dL (ref 0.6–1.1)
Chloride: 106 mEq/L (ref 98–109)
EGFR: >90 mL/min/{1.73_m2} (ref >90)
Glucose: 113 mg/dl (ref 70–140)
Potassium: 3.8 mEq/L (ref 3.5–5.1)
Sodium: 141 mEq/L (ref 136–145)
Total Protein: 6.3 g/dL — ABNORMAL LOW (ref 6.4–8.3)

## 2016-06-12 MED ORDER — ONDANSETRON HCL 8 MG PO TABS
8.0000 mg | ORAL_TABLET | Freq: Once | ORAL | Status: AC
  Administered 2016-06-12: 8 mg via ORAL

## 2016-06-12 MED ORDER — BORTEZOMIB CHEMO SQ INJECTION 3.5 MG (2.5MG/ML)
1.3000 mg/m2 | Freq: Once | INTRAMUSCULAR | Status: AC
  Administered 2016-06-12: 3 mg via SUBCUTANEOUS
  Filled 2016-06-12: qty 3

## 2016-06-12 MED ORDER — ONDANSETRON HCL 8 MG PO TABS
ORAL_TABLET | ORAL | Status: AC
  Filled 2016-06-12: qty 1

## 2016-06-12 NOTE — Telephone Encounter (Signed)
Appointments scheduled throughout 07/17/16. Patient was given a copy of the AVS report and appointment schedule, per 06/12/16 los.

## 2016-06-12 NOTE — Patient Instructions (Signed)
[facility] Discharge Instructions for Patients Receiving Chemotherapy  Today you received the following chemotherapy agents :  bortezomib (Velcade)  To help prevent nausea and vomiting after your treatment, we encourage you to take your nausea medication as prescribed.   If you develop nausea and vomiting that is not controlled by your nausea medication, call the clinic.   BELOW ARE SYMPTOMS THAT SHOULD BE REPORTED IMMEDIATELY:  *FEVER GREATER THAN 100.5 F  *CHILLS WITH OR WITHOUT FEVER  NAUSEA AND VOMITING THAT IS NOT CONTROLLED WITH YOUR NAUSEA MEDICATION  *UNUSUAL SHORTNESS OF BREATH  *UNUSUAL BRUISING OR BLEEDING  TENDERNESS IN MOUTH AND THROAT WITH OR WITHOUT PRESENCE OF ULCERS  *URINARY PROBLEMS  *BOWEL PROBLEMS  UNUSUAL RASH Items with * indicate a potential emergency and should be followed up as soon as possible.  Feel free to call the clinic you have any questions or concerns. The clinic phone number is [phone].  Please show the CHEMO ALERT CARD at check-in to the Emergency Department and triage nurse.   

## 2016-06-12 NOTE — Progress Notes (Signed)
[place] [phone]   [phone]  OFFICE PROGRESS NOTE  [name], MD [place] [address]  DIAGNOSIS: Plasma cell dyscrasia initially diagnosed as MGUS in September 2010, with additional symptoms suggestive of POEMS syndrome.   PRIOR THERAPY:  1) Velcade 1.3 MG/M2 subcutaneously with Decadron 40 mg by mouth on a weekly basis. First cycle 11/24/2013. She status post 31 weekly doses of treatment. 2) Velcade 1.3 MG/M2 subcutaneously and weekly basis with Decadron 40 mg by mouth weekly. First dose 02/01/2015. Status post 28 cycles. 3) Revlimid 25 mg by mouth daily for 21 days every 4 weeks with weekly Decadron 20 mg. started in 11/27/2015. Status post 3 cycles discontinued secondary to lack of response.  CURRENT THERAPY: Systemic treatment with Velcade 1.3 MG/KG weekly, Revlimid 25 mg by mouth daily for 21 days every 4 weeks in addition to Decadron 20 mg by mouth weekly. First dose 03/06/2016. Status post 4 cycles. She is starting cycle #5 on 06/11/2016.  INTERVAL HISTORY: Brenda Conner 56 y.o. female returns to the clinic today for follow-up visit. The patient is feeling fine today was no specific complaints except for fatigue. She is tolerating her current treatment with Velcade, Revlimid and Decadron fairly well. She denied having any chest pain, shortness of breath, cough or hemoptysis. She denied having any fever or chills. She has no nausea, vomiting, diarrhea or constipation. She denied having any weight loss or night sweats. She denied having any bleeding issues. She noticed that her hair is getting straight when she started treatment with Revlimid. She also noticed some swelling of the lower extremities on the week when she is off Revlimid. The patient had repeat myeloma panel performed recently and she is here for evaluation and discussion of her lab results.  MEDICAL HISTORY: Past Medical History:  Diagnosis Date  .  Acid reflux   . Anxiety   . Asthma   . Cancer [facility])    waldenstroms/ macroglobinulemia  . Depression   . Depression   . Diabetes mellitus without complication ([place])   . Dysuria 02/28/2016  . Hypercholesteremia   . Hypertension   . Hypothyroidism   . Macroglobulinemia ([place])    ? POEMS syndrome  . Multiple myeloma ([place])   . Obesity   . PONV (postoperative nausea and vomiting)   . Sleep apnea    CPAP at bedtime  . Sleep apnea     ALLERGIES:  is allergic to codeine; hydrocodone; lortab [hydrocodone-acetaminophen]; onion; shellfish allergy; and amoxicillin.  MEDICATIONS:  Current Outpatient Prescriptions  Medication Sig Dispense Refill  . acyclovir (ZOVIRAX) 400 MG tablet Take 1 tablet (400 mg total) by mouth 2 (two) times daily. 180 tablet 0  . ALPRAZolam (XANAX) 0.5 MG tablet Take 1 tablet (0.5 mg total) by mouth at bedtime as needed for anxiety. 30 tablet 0  . Azelastine HCl 0.15 % SOLN as needed.    . B-D UF III MINI PEN NEEDLES 31G X 5 MM MISC     . Blood Glucose Monitoring Suppl (ONE TOUCH ULTRA MINI) W/DEVICE KIT See admin instructions. Reported on 01/25/2015  0  . Bortezomib (VELCADE IJ) Inject as directed once a week.    . Cetirizine HCl (ZYRTEC ALLERGY PO) Take by mouth daily.    [name] dexamethasone (DECADRON) 4 MG tablet 5 tablet by mouth every week. Start with the first dose of chemotherapy. 40 tablet 3  . DiphenhydrAMINE HCl (BENADRYL ALLERGY PO) Take by mouth  as needed.    [name] EPIPEN 2-PAK 0.3 MG/0.3ML SOAJ injection Reported on 06/21/2015  1  . Fexofenadine HCl (ALLEGRA ALLERGY PO) Take by mouth daily.    . Fluticasone-Salmeterol (ADVAIR) 100-50 MCG/DOSE AEPB Inhale 2 puffs into the lungs every 12 (twelve) hours.    [name] ibuprofen (ADVIL,MOTRIN) 100 MG tablet Take 100 mg by mouth every 6 (six) hours as needed. Reported on 05/31/2015    . insulin glargine (LANTUS) 100 UNIT/ML injection Inject 8 Units into the skin 2 (two) times a week. Reported on 02/18/2015    . levothyroxine  (SYNTHROID, LEVOTHROID) 150 MCG tablet Take 150 mcg by mouth daily before breakfast.    . lisinopril (PRINIVIL,ZESTRIL) 10 MG tablet Take 10 mg by mouth daily.  3  . loperamide (IMODIUM) 2 MG capsule Take 2 mg by mouth as needed for diarrhea or loose stools.    . metFORMIN (GLUCOPHAGE) 1000 MG tablet Take 1,000 mg by mouth 2 (two) times daily.  3  . NOVOLOG FLEXPEN 100 UNIT/ML FlexPen 3 Units daily as needed. Reported on 01/25/2015  6  . omeprazole (PRILOSEC) 40 MG capsule Take 40 mg by mouth 2 (two) times daily.     . ondansetron (ZOFRAN-ODT) 8 MG disintegrating tablet Take 1 tablet (8 mg total) by mouth every 8 (eight) hours as needed. Reported on 05/31/2015 20 tablet 2  . ONE TOUCH ULTRA TEST test strip See admin instructions. Reported on 01/25/2015  11  . pravastatin (PRAVACHOL) 40 MG tablet Take 40 mg by mouth every evening.     [name] PROAIR HFA 108 (90 Base) MCG/ACT inhaler Reported on 06/21/2015  1  . REVLIMID 25 MG capsule Take 1 capsule (25 mg total) by mouth daily. 21 capsule 0  . sertraline (ZOLOFT) 50 MG tablet TAKE 3 TABLETS BY MOUTH EVERY DAY 270 tablet 1  . verapamil (CALAN-SR) 240 MG CR tablet Take 240 mg by mouth 2 (two) times daily.    [name] warfarin (COUMADIN) 2 MG tablet Take 1 tablet (2 mg total) by mouth daily. 90 tablet 0   No current facility-administered medications for this visit.     SURGICAL HISTORY:  Past Surgical History:  Procedure Laterality Date  . ABLATION  09/2007   HTA and polyp resection  . BACK SURGERY  03/2004   herniation, L4-L5  . Bil Laprascopic knee surgery    . BONE MARROW BIOPSY     2011  . BONE MARROW BIOPSY  4/14  . CHOLECYSTECTOMY    . FOOT SURGERY  1998  . KNEE ARTHROSCOPY W/ MENISCAL REPAIR  10/10 ; 3/11  . LAPAROSCOPIC CHOLECYSTECTOMY  1997  . NASAL SINUS SURGERY  2004  . UTERINE FIBROID EMBOLIZATION      REVIEW OF SYSTEMS:  Constitutional: positive for fatigue Eyes: negative Ears, nose, mouth, throat, and face: negative Respiratory:  negative Cardiovascular: negative Gastrointestinal: negative Genitourinary:negative Integument/breast: negative Hematologic/lymphatic: negative Musculoskeletal:negative Neurological: negative Behavioral/Psych: negative Endocrine: negative Allergic/Immunologic: negative   PHYSICAL EXAMINATION: General appearance: alert, cooperative, fatigued and no distress Head: Normocephalic, without obvious abnormality, atraumatic Neck: no adenopathy Lymph nodes: Cervical, supraclavicular, and axillary nodes normal. Resp: clear to auscultation bilaterally Back: symmetric, no curvature. ROM normal. No CVA tenderness. Cardio: regular rate and rhythm, S1, S2 normal, no murmur, click, rub or gallop GI: soft, non-tender; bowel sounds normal; no masses,  no organomegaly Extremities: extremities normal, atraumatic, no cyanosis or edema Neurologic: Alert and oriented X 3, normal strength and tone. Normal symmetric reflexes. Normal coordination and gait  ECOG  PERFORMANCE STATUS: 1 - Symptomatic but completely ambulatory  Blood pressure (!) 136/58, pulse 73, temperature 98.5 F (36.9 C), resp. rate 20, height 5' 6.75" (1.695 m), weight 258 lb 9.6 oz (117.3 kg).  LABORATORY DATA: Lab Results  Component Value Date   WBC 3.6 (L) 06/12/2016   HGB 8.6 (L) 06/12/2016   HCT 28.1 (L) 06/12/2016   MCV 90.4 06/12/2016   PLT 124 (L) 06/12/2016      Chemistry      Component Value Date/Time   NA 141 06/05/2016 0817   K 3.3 (L) 06/05/2016 0817   CL 104 04/07/2012 1415   CO2 22 06/05/2016 0817   BUN 6.1 (L) 06/05/2016 0817   CREATININE 0.7 06/05/2016 0817      Component Value Date/Time   CALCIUM 8.8 06/05/2016 0817   ALKPHOS 109 06/05/2016 0817   AST 46 (H) 06/05/2016 0817   ALT 49 06/05/2016 0817   BILITOT 0.60 06/05/2016 0817      ASSESSMENT AND PLAN:  This is a very pleasant 56 years old white female with multiple myeloma status post several regimens of chemotherapy. She is currently on  treatment with subcutaneous Velcade weekly, Revlimid 25 mg by mouth daily for 21 days every 4 weeks in addition to weekly Decadron 20 mg by mouth status post 5 cycles and has been tolerating the treatment well except for fatigue. Her recent myeloma panel showed improvement of her disease with significant decrease and the free lambda light chain. I discussed the lab results with the patient and give her a copy of her report. I recommended for her to continue treatment with the same regimen and she started cycle #5 yesterday. For the chemotherapy-induced anemia, I will continue to monitor her hemoglobin and hematocrit closely and consider the patient for PRBCs transfusion if her hemoglobin is less than 8.0 if she becomes more symptomatic. She will come back for follow-up visit in 4 weeks for reevaluation before starting cycle #6. The patient was advised to call immediately if she has any concerning symptoms in the interval. All questions were answered. The patient knows to call the clinic with any problems, questions or concerns. We can certainly see the patient much sooner if necessary.  Disclaimer: This note was dictated with voice recognition software. Similar sounding words can inadvertently be transcribed and may not be corrected upon review.

## 2016-06-16 DIAGNOSIS — M5412 Radiculopathy, cervical region: Secondary | ICD-10-CM | POA: Diagnosis not present

## 2016-06-16 DIAGNOSIS — M542 Cervicalgia: Secondary | ICD-10-CM | POA: Diagnosis not present

## 2016-06-16 DIAGNOSIS — M6281 Muscle weakness (generalized): Secondary | ICD-10-CM | POA: Diagnosis not present

## 2016-06-19 ENCOUNTER — Other Ambulatory Visit (HOSPITAL_BASED_OUTPATIENT_CLINIC_OR_DEPARTMENT_OTHER): Payer: BLUE CROSS/BLUE SHIELD

## 2016-06-19 ENCOUNTER — Ambulatory Visit (HOSPITAL_BASED_OUTPATIENT_CLINIC_OR_DEPARTMENT_OTHER): Payer: BLUE CROSS/BLUE SHIELD

## 2016-06-19 DIAGNOSIS — Z5112 Encounter for antineoplastic immunotherapy: Secondary | ICD-10-CM

## 2016-06-19 DIAGNOSIS — C9000 Multiple myeloma not having achieved remission: Secondary | ICD-10-CM

## 2016-06-19 LAB — CBC WITH DIFFERENTIAL/PLATELET
BASO%: 0.7 % (ref 0.0–2.0)
BASOS ABS: 0 10*3/uL (ref 0.0–0.1)
EOS ABS: 0 10*3/uL (ref 0.0–0.5)
EOS%: 0.5 % (ref 0.0–7.0)
HEMATOCRIT: 33.3 % — AB (ref 34.8–46.6)
HEMOGLOBIN: 10.7 g/dL — AB (ref 11.6–15.9)
LYMPH#: 0.4 10*3/uL — AB (ref 0.9–3.3)
LYMPH%: 8.4 % — ABNORMAL LOW (ref 14.0–49.7)
MCH: 28.1 pg (ref 25.1–34.0)
MCHC: 32.2 g/dL (ref 31.5–36.0)
MCV: 87.5 fL (ref 79.5–101.0)
MONO#: 0.1 10*3/uL (ref 0.1–0.9)
MONO%: 1.3 % (ref 0.0–14.0)
NEUT#: 4.3 10*3/uL (ref 1.5–6.5)
NEUT%: 89.1 % — AB (ref 38.4–76.8)
Platelets: 178 10*3/uL (ref 145–400)
RBC: 3.8 10*6/uL (ref 3.70–5.45)
RDW: 19.5 % — AB (ref 11.2–14.5)
WBC: 4.8 10*3/uL (ref 3.9–10.3)

## 2016-06-19 LAB — COMPREHENSIVE METABOLIC PANEL
ALBUMIN: 3.9 g/dL (ref 3.5–5.0)
ALK PHOS: 126 U/L (ref 40–150)
ALT: 42 U/L (ref 0–55)
ANION GAP: 13 meq/L — AB (ref 3–11)
AST: 50 U/L — ABNORMAL HIGH (ref 5–34)
BUN: 11.5 mg/dL (ref 7.0–26.0)
CALCIUM: 9.7 mg/dL (ref 8.4–10.4)
CHLORIDE: 103 meq/L (ref 98–109)
CO2: 18 mEq/L — ABNORMAL LOW (ref 22–29)
Creatinine: 1 mg/dL (ref 0.6–1.1)
EGFR: 66 mL/min/{1.73_m2} — AB (ref >90)
Glucose: 256 mg/dl — ABNORMAL HIGH (ref 70–140)
POTASSIUM: 4.2 meq/L (ref 3.5–5.1)
Sodium: 134 mEq/L — ABNORMAL LOW (ref 136–145)
Total Bilirubin: 0.65 mg/dL (ref 0.20–1.20)
Total Protein: 7.3 g/dL (ref 6.4–8.3)

## 2016-06-19 MED ORDER — BORTEZOMIB CHEMO SQ INJECTION 3.5 MG (2.5MG/ML)
1.3000 mg/m2 | Freq: Once | INTRAMUSCULAR | Status: AC
  Administered 2016-06-19: 3 mg via SUBCUTANEOUS
  Filled 2016-06-19: qty 3

## 2016-06-19 MED ORDER — ONDANSETRON HCL 8 MG PO TABS
8.0000 mg | ORAL_TABLET | Freq: Once | ORAL | Status: AC
  Administered 2016-06-19: 8 mg via ORAL

## 2016-06-19 MED ORDER — ONDANSETRON HCL 8 MG PO TABS
ORAL_TABLET | ORAL | Status: AC
  Filled 2016-06-19: qty 1

## 2016-06-19 NOTE — Patient Instructions (Signed)
[facility] Discharge Instructions for Patients Receiving Chemotherapy  Today you received the following chemotherapy agents Velcade. To help prevent nausea and vomiting after your treatment, we encourage you to take your nausea medication as directed.  If you develop nausea and vomiting that is not controlled by your nausea medication, call the clinic.   BELOW ARE SYMPTOMS THAT SHOULD BE REPORTED IMMEDIATELY:  *FEVER GREATER THAN 100.5 F  *CHILLS WITH OR WITHOUT FEVER  NAUSEA AND VOMITING THAT IS NOT CONTROLLED WITH YOUR NAUSEA MEDICATION  *UNUSUAL SHORTNESS OF BREATH  *UNUSUAL BRUISING OR BLEEDING  TENDERNESS IN MOUTH AND THROAT WITH OR WITHOUT PRESENCE OF ULCERS  *URINARY PROBLEMS  *BOWEL PROBLEMS  UNUSUAL RASH Items with * indicate a potential emergency and should be followed up as soon as possible.  Feel free to call the clinic you have any questions or concerns. The clinic phone number is [phone].  Please show the CHEMO ALERT CARD at check-in to the Emergency Department and triage nurse.    

## 2016-06-22 DIAGNOSIS — J4530 Mild persistent asthma, uncomplicated: Secondary | ICD-10-CM | POA: Diagnosis not present

## 2016-06-22 DIAGNOSIS — J301 Allergic rhinitis due to pollen: Secondary | ICD-10-CM | POA: Diagnosis not present

## 2016-06-22 DIAGNOSIS — J3081 Allergic rhinitis due to animal (cat) (dog) hair and dander: Secondary | ICD-10-CM | POA: Diagnosis not present

## 2016-06-22 DIAGNOSIS — J3089 Other allergic rhinitis: Secondary | ICD-10-CM | POA: Diagnosis not present

## 2016-06-25 ENCOUNTER — Other Ambulatory Visit: Payer: Self-pay | Admitting: Internal Medicine

## 2016-06-25 DIAGNOSIS — C9000 Multiple myeloma not having achieved remission: Secondary | ICD-10-CM

## 2016-06-26 ENCOUNTER — Ambulatory Visit (HOSPITAL_BASED_OUTPATIENT_CLINIC_OR_DEPARTMENT_OTHER): Payer: BLUE CROSS/BLUE SHIELD

## 2016-06-26 ENCOUNTER — Other Ambulatory Visit (HOSPITAL_BASED_OUTPATIENT_CLINIC_OR_DEPARTMENT_OTHER): Payer: BLUE CROSS/BLUE SHIELD

## 2016-06-26 DIAGNOSIS — Z5112 Encounter for antineoplastic immunotherapy: Secondary | ICD-10-CM | POA: Diagnosis not present

## 2016-06-26 DIAGNOSIS — C9000 Multiple myeloma not having achieved remission: Secondary | ICD-10-CM | POA: Diagnosis not present

## 2016-06-26 LAB — CBC WITH DIFFERENTIAL/PLATELET
BASO%: 0.7 % (ref 0.0–2.0)
BASOS ABS: 0 10*3/uL (ref 0.0–0.1)
EOS ABS: 0.1 10*3/uL (ref 0.0–0.5)
EOS%: 1.4 % (ref 0.0–7.0)
HEMATOCRIT: 31.4 % — AB (ref 34.8–46.6)
HEMOGLOBIN: 10.4 g/dL — AB (ref 11.6–15.9)
LYMPH#: 0.4 10*3/uL — AB (ref 0.9–3.3)
LYMPH%: 7.1 % — ABNORMAL LOW (ref 14.0–49.7)
MCH: 28.5 pg (ref 25.1–34.0)
MCHC: 33 g/dL (ref 31.5–36.0)
MCV: 86.2 fL (ref 79.5–101.0)
MONO#: 0.2 10*3/uL (ref 0.1–0.9)
MONO%: 4.5 % (ref 0.0–14.0)
NEUT%: 86.3 % — ABNORMAL HIGH (ref 38.4–76.8)
NEUTROS ABS: 4.8 10*3/uL (ref 1.5–6.5)
Platelets: 115 10*3/uL — ABNORMAL LOW (ref 145–400)
RBC: 3.64 10*6/uL — ABNORMAL LOW (ref 3.70–5.45)
RDW: 19.9 % — AB (ref 11.2–14.5)
WBC: 5.5 10*3/uL (ref 3.9–10.3)

## 2016-06-26 LAB — COMPREHENSIVE METABOLIC PANEL
ALBUMIN: 3.8 g/dL (ref 3.5–5.0)
ALK PHOS: 112 U/L (ref 40–150)
ALT: 59 U/L — AB (ref 0–55)
AST: 53 U/L — AB (ref 5–34)
Anion Gap: 12 mEq/L — ABNORMAL HIGH (ref 3–11)
BILIRUBIN TOTAL: 0.78 mg/dL (ref 0.20–1.20)
BUN: 9.5 mg/dL (ref 7.0–26.0)
CALCIUM: 9.4 mg/dL (ref 8.4–10.4)
CO2: 22 mEq/L (ref 22–29)
CREATININE: 0.8 mg/dL (ref 0.6–1.1)
Chloride: 103 mEq/L (ref 98–109)
EGFR: 81 mL/min/{1.73_m2} — ABNORMAL LOW (ref >90)
Glucose: 166 mg/dl — ABNORMAL HIGH (ref 70–140)
POTASSIUM: 4.4 meq/L (ref 3.5–5.1)
Sodium: 137 mEq/L (ref 136–145)
TOTAL PROTEIN: 7 g/dL (ref 6.4–8.3)

## 2016-06-26 MED ORDER — BORTEZOMIB CHEMO SQ INJECTION 3.5 MG (2.5MG/ML)
1.3000 mg/m2 | Freq: Once | INTRAMUSCULAR | Status: AC
  Administered 2016-06-26: 3 mg via SUBCUTANEOUS
  Filled 2016-06-26: qty 3

## 2016-06-26 MED ORDER — ONDANSETRON HCL 8 MG PO TABS
8.0000 mg | ORAL_TABLET | Freq: Once | ORAL | Status: AC
  Administered 2016-06-26: 8 mg via ORAL

## 2016-06-26 MED ORDER — ONDANSETRON HCL 8 MG PO TABS
ORAL_TABLET | ORAL | Status: AC
  Filled 2016-06-26: qty 1

## 2016-06-26 NOTE — Patient Instructions (Signed)
[facility] Discharge Instructions for Patients Receiving Chemotherapy  Today you received the following chemotherapy agents:  Velcade  To help prevent nausea and vomiting after your treatment, we encourage you to take your nausea medication as prescribed.   If you develop nausea and vomiting that is not controlled by your nausea medication, call the clinic.   BELOW ARE SYMPTOMS THAT SHOULD BE REPORTED IMMEDIATELY:  *FEVER GREATER THAN 100.5 F  *CHILLS WITH OR WITHOUT FEVER  NAUSEA AND VOMITING THAT IS NOT CONTROLLED WITH YOUR NAUSEA MEDICATION  *UNUSUAL SHORTNESS OF BREATH  *UNUSUAL BRUISING OR BLEEDING  TENDERNESS IN MOUTH AND THROAT WITH OR WITHOUT PRESENCE OF ULCERS  *URINARY PROBLEMS  *BOWEL PROBLEMS  UNUSUAL RASH Items with * indicate a potential emergency and should be followed up as soon as possible.  Feel free to call the clinic you have any questions or concerns. The clinic phone number is [phone].  Please show the CHEMO ALERT CARD at check-in to the Emergency Department and triage nurse.   

## 2016-07-03 ENCOUNTER — Other Ambulatory Visit (HOSPITAL_BASED_OUTPATIENT_CLINIC_OR_DEPARTMENT_OTHER): Payer: BLUE CROSS/BLUE SHIELD

## 2016-07-03 ENCOUNTER — Ambulatory Visit (HOSPITAL_BASED_OUTPATIENT_CLINIC_OR_DEPARTMENT_OTHER): Payer: BLUE CROSS/BLUE SHIELD

## 2016-07-03 DIAGNOSIS — Z5112 Encounter for antineoplastic immunotherapy: Secondary | ICD-10-CM

## 2016-07-03 DIAGNOSIS — C9000 Multiple myeloma not having achieved remission: Secondary | ICD-10-CM

## 2016-07-03 LAB — CBC WITH DIFFERENTIAL/PLATELET
BASO%: 1.3 % (ref 0.0–2.0)
BASOS ABS: 0 10*3/uL (ref 0.0–0.1)
EOS%: 6 % (ref 0.0–7.0)
Eosinophils Absolute: 0.2 10*3/uL (ref 0.0–0.5)
HEMATOCRIT: 30.8 % — AB (ref 34.8–46.6)
HGB: 10 g/dL — ABNORMAL LOW (ref 11.6–15.9)
LYMPH#: 0.6 10*3/uL — AB (ref 0.9–3.3)
LYMPH%: 21.8 % (ref 14.0–49.7)
MCH: 28.5 pg (ref 25.1–34.0)
MCHC: 32.5 g/dL (ref 31.5–36.0)
MCV: 87.5 fL (ref 79.5–101.0)
MONO#: 0.3 10*3/uL (ref 0.1–0.9)
MONO%: 11.9 % (ref 0.0–14.0)
NEUT#: 1.7 10*3/uL (ref 1.5–6.5)
NEUT%: 59 % (ref 38.4–76.8)
Platelets: 108 10*3/uL — ABNORMAL LOW (ref 145–400)
RBC: 3.52 10*6/uL — ABNORMAL LOW (ref 3.70–5.45)
RDW: 20 % — ABNORMAL HIGH (ref 11.2–14.5)
WBC: 2.9 10*3/uL — ABNORMAL LOW (ref 3.9–10.3)

## 2016-07-03 LAB — COMPREHENSIVE METABOLIC PANEL
ALT: 56 U/L — ABNORMAL HIGH (ref 0–55)
AST: 49 U/L — AB (ref 5–34)
Albumin: 3.5 g/dL (ref 3.5–5.0)
Alkaline Phosphatase: 120 U/L (ref 40–150)
Anion Gap: 12 mEq/L — ABNORMAL HIGH (ref 3–11)
BUN: 7.7 mg/dL (ref 7.0–26.0)
CALCIUM: 9.3 mg/dL (ref 8.4–10.4)
CHLORIDE: 106 meq/L (ref 98–109)
CO2: 23 meq/L (ref 22–29)
Creatinine: 0.8 mg/dL (ref 0.6–1.1)
EGFR: 85 mL/min/{1.73_m2} — ABNORMAL LOW (ref >90)
Glucose: 121 mg/dl (ref 70–140)
Potassium: 4 mEq/L (ref 3.5–5.1)
Sodium: 141 mEq/L (ref 136–145)
Total Bilirubin: 0.65 mg/dL (ref 0.20–1.20)
Total Protein: 6.5 g/dL (ref 6.4–8.3)

## 2016-07-03 MED ORDER — ONDANSETRON HCL 8 MG PO TABS
8.0000 mg | ORAL_TABLET | Freq: Once | ORAL | Status: AC
  Administered 2016-07-03: 8 mg via ORAL

## 2016-07-03 MED ORDER — BORTEZOMIB CHEMO SQ INJECTION 3.5 MG (2.5MG/ML)
1.3000 mg/m2 | Freq: Once | INTRAMUSCULAR | Status: AC
  Administered 2016-07-03: 3 mg via SUBCUTANEOUS
  Filled 2016-07-03: qty 3

## 2016-07-03 MED ORDER — ONDANSETRON HCL 8 MG PO TABS
ORAL_TABLET | ORAL | Status: AC
  Filled 2016-07-03: qty 1

## 2016-07-03 NOTE — Patient Instructions (Signed)
[facility] Discharge Instructions for Patients Receiving Chemotherapy  Today you received the following chemotherapy agents Velcade. To help prevent nausea and vomiting after your treatment, we encourage you to take your nausea medication as directed.  If you develop nausea and vomiting that is not controlled by your nausea medication, call the clinic.   BELOW ARE SYMPTOMS THAT SHOULD BE REPORTED IMMEDIATELY:  *FEVER GREATER THAN 100.5 F  *CHILLS WITH OR WITHOUT FEVER  NAUSEA AND VOMITING THAT IS NOT CONTROLLED WITH YOUR NAUSEA MEDICATION  *UNUSUAL SHORTNESS OF BREATH  *UNUSUAL BRUISING OR BLEEDING  TENDERNESS IN MOUTH AND THROAT WITH OR WITHOUT PRESENCE OF ULCERS  *URINARY PROBLEMS  *BOWEL PROBLEMS  UNUSUAL RASH Items with * indicate a potential emergency and should be followed up as soon as possible.  Feel free to call the clinic you have any questions or concerns. The clinic phone number is [phone].  Please show the CHEMO ALERT CARD at check-in to the Emergency Department and triage nurse.    

## 2016-07-04 ENCOUNTER — Encounter: Payer: Self-pay | Admitting: Internal Medicine

## 2016-07-05 ENCOUNTER — Other Ambulatory Visit: Payer: Self-pay | Admitting: Medical Oncology

## 2016-07-05 DIAGNOSIS — C9000 Multiple myeloma not having achieved remission: Secondary | ICD-10-CM

## 2016-07-05 MED ORDER — REVLIMID 25 MG PO CAPS: ORAL_CAPSULE | ORAL | 0 refills | Status: DC

## 2016-07-07 DIAGNOSIS — M542 Cervicalgia: Secondary | ICD-10-CM | POA: Diagnosis not present

## 2016-07-07 DIAGNOSIS — M6281 Muscle weakness (generalized): Secondary | ICD-10-CM | POA: Diagnosis not present

## 2016-07-07 DIAGNOSIS — M5412 Radiculopathy, cervical region: Secondary | ICD-10-CM | POA: Diagnosis not present

## 2016-07-10 ENCOUNTER — Encounter: Payer: Self-pay | Admitting: Internal Medicine

## 2016-07-10 ENCOUNTER — Other Ambulatory Visit (HOSPITAL_BASED_OUTPATIENT_CLINIC_OR_DEPARTMENT_OTHER): Payer: BLUE CROSS/BLUE SHIELD

## 2016-07-10 ENCOUNTER — Telehealth: Payer: Self-pay | Admitting: Internal Medicine

## 2016-07-10 ENCOUNTER — Ambulatory Visit (HOSPITAL_BASED_OUTPATIENT_CLINIC_OR_DEPARTMENT_OTHER): Payer: BLUE CROSS/BLUE SHIELD | Admitting: Internal Medicine

## 2016-07-10 ENCOUNTER — Ambulatory Visit (HOSPITAL_BASED_OUTPATIENT_CLINIC_OR_DEPARTMENT_OTHER): Payer: BLUE CROSS/BLUE SHIELD

## 2016-07-10 DIAGNOSIS — C9000 Multiple myeloma not having achieved remission: Secondary | ICD-10-CM

## 2016-07-10 DIAGNOSIS — Z5112 Encounter for antineoplastic immunotherapy: Secondary | ICD-10-CM | POA: Diagnosis not present

## 2016-07-10 LAB — CBC WITH DIFFERENTIAL/PLATELET
BASO%: 1.9 % (ref 0.0–2.0)
Basophils Absolute: 0.1 10*3/uL (ref 0.0–0.1)
EOS%: 1.6 % (ref 0.0–7.0)
Eosinophils Absolute: 0.1 10*3/uL (ref 0.0–0.5)
HEMATOCRIT: 30.6 % — AB (ref 34.8–46.6)
HEMOGLOBIN: 10.1 g/dL — AB (ref 11.6–15.9)
LYMPH#: 0.8 10*3/uL — AB (ref 0.9–3.3)
LYMPH%: 22.7 % (ref 14.0–49.7)
MCH: 28.7 pg (ref 25.1–34.0)
MCHC: 32.9 g/dL (ref 31.5–36.0)
MCV: 87.3 fL (ref 79.5–101.0)
MONO#: 0.4 10*3/uL (ref 0.1–0.9)
MONO%: 12.4 % (ref 0.0–14.0)
NEUT%: 61.4 % (ref 38.4–76.8)
NEUTROS ABS: 2.2 10*3/uL (ref 1.5–6.5)
PLATELETS: 154 10*3/uL (ref 145–400)
RBC: 3.51 10*6/uL — ABNORMAL LOW (ref 3.70–5.45)
RDW: 19.7 % — AB (ref 11.2–14.5)
WBC: 3.6 10*3/uL — ABNORMAL LOW (ref 3.9–10.3)

## 2016-07-10 LAB — COMPREHENSIVE METABOLIC PANEL
ALK PHOS: 102 U/L (ref 40–150)
ALT: 41 U/L (ref 0–55)
AST: 40 U/L — AB (ref 5–34)
Albumin: 3.7 g/dL (ref 3.5–5.0)
Anion Gap: 13 mEq/L — ABNORMAL HIGH (ref 3–11)
BUN: 8.7 mg/dL (ref 7.0–26.0)
CALCIUM: 9.7 mg/dL (ref 8.4–10.4)
CO2: 22 mEq/L (ref 22–29)
CREATININE: 0.8 mg/dL (ref 0.6–1.1)
Chloride: 107 mEq/L (ref 98–109)
EGFR: 87 mL/min/{1.73_m2} — ABNORMAL LOW (ref >90)
Glucose: 122 mg/dl (ref 70–140)
Potassium: 4 mEq/L (ref 3.5–5.1)
SODIUM: 141 meq/L (ref 136–145)
Total Bilirubin: 0.53 mg/dL (ref 0.20–1.20)
Total Protein: 6.7 g/dL (ref 6.4–8.3)

## 2016-07-10 MED ORDER — BORTEZOMIB CHEMO SQ INJECTION 3.5 MG (2.5MG/ML)
1.3000 mg/m2 | Freq: Once | INTRAMUSCULAR | Status: AC
  Administered 2016-07-10: 3 mg via SUBCUTANEOUS
  Filled 2016-07-10: qty 3

## 2016-07-10 MED ORDER — ONDANSETRON HCL 8 MG PO TABS
8.0000 mg | ORAL_TABLET | Freq: Once | ORAL | Status: AC
  Administered 2016-07-10: 8 mg via ORAL

## 2016-07-10 MED ORDER — DEXAMETHASONE 4 MG PO TABS: ORAL_TABLET | ORAL | 3 refills | Status: DC

## 2016-07-10 MED ORDER — ONDANSETRON HCL 8 MG PO TABS
ORAL_TABLET | ORAL | Status: AC
  Filled 2016-07-10: qty 1

## 2016-07-10 MED ORDER — WARFARIN SODIUM 2 MG PO TABS: 2.0000 mg | ORAL_TABLET | Freq: Every day | ORAL | 0 refills | Status: DC

## 2016-07-10 MED ORDER — ACYCLOVIR 400 MG PO TABS: 400.0000 mg | ORAL_TABLET | Freq: Two times a day (BID) | ORAL | 0 refills | Status: DC

## 2016-07-10 NOTE — Telephone Encounter (Signed)
Appointments scheduled per 07/10/16 los. Patient was given a copy of the AVS report and appointment schedule, per 07/10/16 los.

## 2016-07-10 NOTE — Patient Instructions (Signed)
[facility] Discharge Instructions for Patients Receiving Chemotherapy  Today you received the following chemotherapy agents: bortezomib (Velcade).  To help prevent nausea and vomiting after your treatment, we encourage you to take your nausea medication as directed  If you develop nausea and vomiting that is not controlled by your nausea medication, call the clinic.   BELOW ARE SYMPTOMS THAT SHOULD BE REPORTED IMMEDIATELY:  *FEVER GREATER THAN 100.5 F  *CHILLS WITH OR WITHOUT FEVER  NAUSEA AND VOMITING THAT IS NOT CONTROLLED WITH YOUR NAUSEA MEDICATION  *UNUSUAL SHORTNESS OF BREATH  *UNUSUAL BRUISING OR BLEEDING  TENDERNESS IN MOUTH AND THROAT WITH OR WITHOUT PRESENCE OF ULCERS  *URINARY PROBLEMS  *BOWEL PROBLEMS  UNUSUAL RASH Items with * indicate a potential emergency and should be followed up as soon as possible.  Feel free to call the clinic you have any questions or concerns. The clinic phone number is [phone].  Please show the CHEMO ALERT CARD at check-in to the Emergency Department and triage nurse.     

## 2016-07-10 NOTE — Progress Notes (Signed)
[place] Telephone:([phone]   [phone]  OFFICE PROGRESS NOTE  [name], MD [place] [address]  DIAGNOSIS: Plasma cell dyscrasia initially diagnosed as MGUS in September 2010, with additional symptoms suggestive of POEMS syndrome.   PRIOR THERAPY:  1) Velcade 1.3 MG/M2 subcutaneously with Decadron 40 mg by mouth on a weekly basis. First cycle 11/24/2013. She status post 31 weekly doses of treatment. 2) Velcade 1.3 MG/M2 subcutaneously and weekly basis with Decadron 40 mg by mouth weekly. First dose 02/01/2015. Status post 28 cycles. 3) Revlimid 25 mg by mouth daily for 21 days every 4 weeks with weekly Decadron 20 mg. started in 11/27/2015. Status post 3 cycles discontinued secondary to lack of response.  CURRENT THERAPY: Systemic treatment with Velcade 1.3 MG/KG weekly, Revlimid 25 mg by mouth daily for 21 days every 4 weeks in addition to Decadron 20 mg by mouth weekly. First dose 03/06/2016. Status post 6 cycles. She is starting cycle #7 on 07/09/2016.  INTERVAL HISTORY: Brenda Conner 56 y.o. female returns to the clinic today for follow-up visit. The patient is feeling fine today was no specific complaints. She has been tolerating her treatment with Velcade, Revlimid and Decadron fairly well. She denied having any chest pain, shortness of breath, cough or hemoptysis. She denied having any nausea or vomiting. She has no fever or chills. She intentionally lost few pounds since her last visit. She is today for evaluation and repeat blood work.  MEDICAL HISTORY: Past Medical History:  Diagnosis Date  . Acid reflux   . Anxiety   . Asthma   . Cancer [facility])    waldenstroms/ macroglobinulemia  . Depression   . Depression   . Diabetes mellitus without complication ([place])   . Dysuria 02/28/2016  . Hypercholesteremia   . Hypertension   . Hypothyroidism   . Macroglobulinemia ([place])    ? POEMS syndrome  . Multiple myeloma  ([place])   . Obesity   . PONV (postoperative nausea and vomiting)   . Sleep apnea    CPAP at bedtime  . Sleep apnea     ALLERGIES:  is allergic to codeine; hydrocodone; lortab [hydrocodone-acetaminophen]; onion; shellfish allergy; and amoxicillin.  MEDICATIONS:  Current Outpatient Prescriptions  Medication Sig Dispense Refill  . acyclovir (ZOVIRAX) 400 MG tablet Take 1 tablet (400 mg total) by mouth 2 (two) times daily. 180 tablet 0  . ALPRAZolam (XANAX) 0.5 MG tablet Take 1 tablet (0.5 mg total) by mouth at bedtime as needed for anxiety. 30 tablet 0  . Azelastine HCl 0.15 % SOLN as needed.    . B-D UF III MINI PEN NEEDLES 31G X 5 MM MISC     . Blood Glucose Monitoring Suppl (ONE TOUCH ULTRA MINI) W/DEVICE KIT See admin instructions. Reported on 01/25/2015  0  . Bortezomib (VELCADE IJ) Inject as directed once a week.    . Cetirizine HCl (ZYRTEC ALLERGY PO) Take by mouth daily.    [name] dexamethasone (DECADRON) 4 MG tablet 5 tablet by mouth every week. Start with the first dose of chemotherapy. 40 tablet 3  . DiphenhydrAMINE HCl (BENADRYL ALLERGY PO) Take by mouth as needed.    [name] EPIPEN 2-PAK 0.3 MG/0.3ML SOAJ injection Reported on 06/21/2015  1  . Fexofenadine HCl (ALLEGRA ALLERGY PO) Take by mouth daily.    . Fluticasone-Salmeterol (ADVAIR) 100-50 MCG/DOSE AEPB Inhale 2 puffs into the lungs every 12 (twelve) hours.    [name] ibuprofen (ADVIL,MOTRIN)  100 MG tablet Take 100 mg by mouth every 6 (six) hours as needed. Reported on 05/31/2015    . insulin glargine (LANTUS) 100 UNIT/ML injection Inject 8 Units into the skin 2 (two) times a week. Reported on 02/18/2015    . levothyroxine (SYNTHROID, LEVOTHROID) 150 MCG tablet Take 150 mcg by mouth daily before breakfast.    . lisinopril (PRINIVIL,ZESTRIL) 10 MG tablet Take 10 mg by mouth daily.  3  . loperamide (IMODIUM) 2 MG capsule Take 2 mg by mouth as needed for diarrhea or loose stools.    . metFORMIN (GLUCOPHAGE) 1000 MG tablet Take 1,000 mg by mouth 2  (two) times daily.  3  . NOVOLOG FLEXPEN 100 UNIT/ML FlexPen 3 Units daily as needed. Reported on 01/25/2015  6  . omeprazole (PRILOSEC) 40 MG capsule Take 40 mg by mouth 2 (two) times daily.     . ondansetron (ZOFRAN-ODT) 8 MG disintegrating tablet Take 1 tablet (8 mg total) by mouth every 8 (eight) hours as needed. Reported on 05/31/2015 20 tablet 2  . ONE TOUCH ULTRA TEST test strip See admin instructions. Reported on 01/25/2015  11  . pravastatin (PRAVACHOL) 40 MG tablet Take 40 mg by mouth every evening.     [name] PROAIR HFA 108 (90 Base) MCG/ACT inhaler Reported on 06/21/2015  1  . REVLIMID 25 MG capsule TAKE 1 CAPSULE (25MG) BY MOUTH ONCE DAILY 21 DAYS ON AND 7 DAYS OFF 07/05/16 Authorization number [id number] -adult female not of child bearing potential 21 capsule 0  . sertraline (ZOLOFT) 50 MG tablet TAKE 3 TABLETS BY MOUTH EVERY DAY 270 tablet 1  . verapamil (CALAN-SR) 240 MG CR tablet Take 240 mg by mouth 2 (two) times daily.    [name] warfarin (COUMADIN) 2 MG tablet Take 1 tablet (2 mg total) by mouth daily. 90 tablet 0   No current facility-administered medications for this visit.     SURGICAL HISTORY:  Past Surgical History:  Procedure Laterality Date  . ABLATION  09/2007   HTA and polyp resection  . BACK SURGERY  03/2004   herniation, L4-L5  . Bil Laprascopic knee surgery    . BONE MARROW BIOPSY     2011  . BONE MARROW BIOPSY  4/14  . CHOLECYSTECTOMY    . FOOT SURGERY  1998  . KNEE ARTHROSCOPY W/ MENISCAL REPAIR  10/10 ; 3/11  . LAPAROSCOPIC CHOLECYSTECTOMY  1997  . NASAL SINUS SURGERY  2004  . UTERINE FIBROID EMBOLIZATION      REVIEW OF SYSTEMS:  A comprehensive review of systems was negative.   PHYSICAL EXAMINATION: General appearance: alert, cooperative and no distress Head: Normocephalic, without obvious abnormality, atraumatic Neck: no adenopathy Lymph nodes: Cervical, supraclavicular, and axillary nodes normal. Resp: clear to auscultation bilaterally Back: symmetric, no  curvature. ROM normal. No CVA tenderness. Cardio: regular rate and rhythm, S1, S2 normal, no murmur, click, rub or gallop GI: soft, non-tender; bowel sounds normal; no masses,  no organomegaly Extremities: extremities normal, atraumatic, no cyanosis or edema  ECOG PERFORMANCE STATUS: 1 - Symptomatic but completely ambulatory  Blood pressure 112/89, pulse (!) 59, temperature 97.8 F (36.6 C), temperature source Oral, resp. rate 19, height 5' 6.75" (1.695 m), weight 252 lb 6.4 oz (114.5 kg), SpO2 100 %.  LABORATORY DATA: Lab Results  Component Value Date   WBC 3.6 (L) 07/10/2016   HGB 10.1 (L) 07/10/2016   HCT 30.6 (L) 07/10/2016   MCV 87.3 07/10/2016   PLT 154 07/10/2016  Chemistry      Component Value Date/Time   NA 141 07/10/2016 0813   K 4.0 07/10/2016 0813   CL 104 04/07/2012 1415   CO2 22 07/10/2016 0813   BUN 8.7 07/10/2016 0813   CREATININE 0.8 07/10/2016 0813      Component Value Date/Time   CALCIUM 9.7 07/10/2016 0813   ALKPHOS 102 07/10/2016 0813   AST 40 (H) 07/10/2016 0813   ALT 41 07/10/2016 0813   BILITOT 0.53 07/10/2016 0813      ASSESSMENT AND PLAN:  This is a very pleasant 56 years old white female with multiple myeloma status post several chemotherapy regimens and she is currently on treatment with subcutaneous weekly Velcade as well as Revlimid and Decadron. She has been tolerating her treatment fairly well. I recommended for the patient to continue her treatment with the same dose. I would see her back for follow-up visit in 6 weeks for evaluation after repeating myeloma panel. I gave her a refill for acyclover, Coumadin and Decadron. She was advised to call immediately if she has any concerning symptoms in the interval. All questions were answered. The patient knows to call the clinic with any problems, questions or concerns. We can certainly see the patient much sooner if necessary.  Disclaimer: This note was dictated with voice recognition  software. Similar sounding words can inadvertently be transcribed and may not be corrected upon review.

## 2016-07-17 ENCOUNTER — Other Ambulatory Visit (HOSPITAL_BASED_OUTPATIENT_CLINIC_OR_DEPARTMENT_OTHER): Payer: BLUE CROSS/BLUE SHIELD

## 2016-07-17 ENCOUNTER — Ambulatory Visit (HOSPITAL_BASED_OUTPATIENT_CLINIC_OR_DEPARTMENT_OTHER): Payer: BLUE CROSS/BLUE SHIELD

## 2016-07-17 DIAGNOSIS — C9000 Multiple myeloma not having achieved remission: Secondary | ICD-10-CM

## 2016-07-17 DIAGNOSIS — Z5112 Encounter for antineoplastic immunotherapy: Secondary | ICD-10-CM | POA: Diagnosis not present

## 2016-07-17 LAB — CBC WITH DIFFERENTIAL/PLATELET
BASO%: 1.3 % (ref 0.0–2.0)
Basophils Absolute: 0 10*3/uL (ref 0.0–0.1)
EOS%: 2.3 % (ref 0.0–7.0)
Eosinophils Absolute: 0.1 10*3/uL (ref 0.0–0.5)
HCT: 29.2 % — ABNORMAL LOW (ref 34.8–46.6)
HGB: 9.7 g/dL — ABNORMAL LOW (ref 11.6–15.9)
LYMPH%: 18 % (ref 14.0–49.7)
MCH: 28.4 pg (ref 25.1–34.0)
MCHC: 33.1 g/dL (ref 31.5–36.0)
MCV: 85.8 fL (ref 79.5–101.0)
MONO#: 0.2 10*3/uL (ref 0.1–0.9)
MONO%: 5.9 % (ref 0.0–14.0)
NEUT#: 2.5 10*3/uL (ref 1.5–6.5)
NEUT%: 72.5 % (ref 38.4–76.8)
PLATELETS: 115 10*3/uL — AB (ref 145–400)
RBC: 3.4 10*6/uL — AB (ref 3.70–5.45)
RDW: 19.4 % — ABNORMAL HIGH (ref 11.2–14.5)
WBC: 3.4 10*3/uL — AB (ref 3.9–10.3)
lymph#: 0.6 10*3/uL — ABNORMAL LOW (ref 0.9–3.3)

## 2016-07-17 LAB — COMPREHENSIVE METABOLIC PANEL
ALT: 42 U/L (ref 0–55)
ANION GAP: 12 meq/L — AB (ref 3–11)
AST: 36 U/L — ABNORMAL HIGH (ref 5–34)
Albumin: 3.6 g/dL (ref 3.5–5.0)
Alkaline Phosphatase: 106 U/L (ref 40–150)
BUN: 9.7 mg/dL (ref 7.0–26.0)
CO2: 24 meq/L (ref 22–29)
CREATININE: 0.7 mg/dL (ref 0.6–1.1)
Calcium: 9.3 mg/dL (ref 8.4–10.4)
Chloride: 104 mEq/L (ref 98–109)
EGFR: >90 mL/min/{1.73_m2} (ref >90)
Glucose: 120 mg/dl (ref 70–140)
Potassium: 3.7 mEq/L (ref 3.5–5.1)
SODIUM: 140 meq/L (ref 136–145)
Total Bilirubin: 0.54 mg/dL (ref 0.20–1.20)
Total Protein: 6.7 g/dL (ref 6.4–8.3)

## 2016-07-17 MED ORDER — ONDANSETRON HCL 8 MG PO TABS
8.0000 mg | ORAL_TABLET | Freq: Once | ORAL | Status: AC
  Administered 2016-07-17: 8 mg via ORAL

## 2016-07-17 MED ORDER — BORTEZOMIB CHEMO SQ INJECTION 3.5 MG (2.5MG/ML)
1.3000 mg/m2 | Freq: Once | INTRAMUSCULAR | Status: AC
  Administered 2016-07-17: 3 mg via SUBCUTANEOUS
  Filled 2016-07-17: qty 3

## 2016-07-17 MED ORDER — ONDANSETRON HCL 8 MG PO TABS
ORAL_TABLET | ORAL | Status: AC
  Filled 2016-07-17: qty 1

## 2016-07-21 DIAGNOSIS — M542 Cervicalgia: Secondary | ICD-10-CM | POA: Diagnosis not present

## 2016-07-21 DIAGNOSIS — M5412 Radiculopathy, cervical region: Secondary | ICD-10-CM | POA: Diagnosis not present

## 2016-07-23 ENCOUNTER — Other Ambulatory Visit: Payer: Self-pay | Admitting: Internal Medicine

## 2016-07-23 DIAGNOSIS — C9000 Multiple myeloma not having achieved remission: Secondary | ICD-10-CM

## 2016-07-24 ENCOUNTER — Ambulatory Visit (HOSPITAL_BASED_OUTPATIENT_CLINIC_OR_DEPARTMENT_OTHER): Payer: BLUE CROSS/BLUE SHIELD

## 2016-07-24 ENCOUNTER — Other Ambulatory Visit (HOSPITAL_BASED_OUTPATIENT_CLINIC_OR_DEPARTMENT_OTHER): Payer: BLUE CROSS/BLUE SHIELD

## 2016-07-24 DIAGNOSIS — C9000 Multiple myeloma not having achieved remission: Secondary | ICD-10-CM

## 2016-07-24 DIAGNOSIS — Z5112 Encounter for antineoplastic immunotherapy: Secondary | ICD-10-CM | POA: Diagnosis not present

## 2016-07-24 LAB — CBC WITH DIFFERENTIAL/PLATELET
BASO%: 0.6 % (ref 0.0–2.0)
BASOS ABS: 0 10*3/uL (ref 0.0–0.1)
EOS%: 3.4 % (ref 0.0–7.0)
Eosinophils Absolute: 0.1 10*3/uL (ref 0.0–0.5)
HEMATOCRIT: 29.6 % — AB (ref 34.8–46.6)
HGB: 9.2 g/dL — ABNORMAL LOW (ref 11.6–15.9)
LYMPH#: 0.6 10*3/uL — AB (ref 0.9–3.3)
LYMPH%: 17.7 % (ref 14.0–49.7)
MCH: 28.1 pg (ref 25.1–34.0)
MCHC: 31.1 g/dL — AB (ref 31.5–36.0)
MCV: 90.5 fL (ref 79.5–101.0)
MONO#: 0.4 10*3/uL (ref 0.1–0.9)
MONO%: 11.7 % (ref 0.0–14.0)
NEUT#: 2.3 10*3/uL (ref 1.5–6.5)
NEUT%: 66.6 % (ref 38.4–76.8)
PLATELETS: 101 10*3/uL — AB (ref 145–400)
RBC: 3.27 10*6/uL — AB (ref 3.70–5.45)
RDW: 17.8 % — ABNORMAL HIGH (ref 11.2–14.5)
WBC: 3.5 10*3/uL — ABNORMAL LOW (ref 3.9–10.3)

## 2016-07-24 LAB — COMPREHENSIVE METABOLIC PANEL
ALT: 50 U/L (ref 0–55)
ANION GAP: 11 meq/L (ref 3–11)
AST: 43 U/L — ABNORMAL HIGH (ref 5–34)
Albumin: 3.5 g/dL (ref 3.5–5.0)
Alkaline Phosphatase: 115 U/L (ref 40–150)
BUN: 9.2 mg/dL (ref 7.0–26.0)
CALCIUM: 9.3 mg/dL (ref 8.4–10.4)
CHLORIDE: 105 meq/L (ref 98–109)
CO2: 22 meq/L (ref 22–29)
Creatinine: 0.7 mg/dL (ref 0.6–1.1)
Glucose: 121 mg/dl (ref 70–140)
POTASSIUM: 3.7 meq/L (ref 3.5–5.1)
Sodium: 138 mEq/L (ref 136–145)
Total Bilirubin: 0.51 mg/dL (ref 0.20–1.20)
Total Protein: 6.5 g/dL (ref 6.4–8.3)

## 2016-07-24 MED ORDER — ONDANSETRON HCL 8 MG PO TABS
8.0000 mg | ORAL_TABLET | Freq: Once | ORAL | Status: AC
  Administered 2016-07-24: 8 mg via ORAL

## 2016-07-24 MED ORDER — ONDANSETRON HCL 8 MG PO TABS
ORAL_TABLET | ORAL | Status: AC
  Filled 2016-07-24: qty 1

## 2016-07-24 MED ORDER — BORTEZOMIB CHEMO SQ INJECTION 3.5 MG (2.5MG/ML)
1.3000 mg/m2 | Freq: Once | INTRAMUSCULAR | Status: AC
  Administered 2016-07-24: 3 mg via SUBCUTANEOUS
  Filled 2016-07-24: qty 3

## 2016-07-24 NOTE — Patient Instructions (Signed)
[facility] Discharge Instructions for Patients Receiving Chemotherapy  Today you received the following chemotherapy agents: bortezomib (Velcade).  To help prevent nausea and vomiting after your treatment, we encourage you to take your nausea medication as directed  If you develop nausea and vomiting that is not controlled by your nausea medication, call the clinic.   BELOW ARE SYMPTOMS THAT SHOULD BE REPORTED IMMEDIATELY:  *FEVER GREATER THAN 100.5 F  *CHILLS WITH OR WITHOUT FEVER  NAUSEA AND VOMITING THAT IS NOT CONTROLLED WITH YOUR NAUSEA MEDICATION  *UNUSUAL SHORTNESS OF BREATH  *UNUSUAL BRUISING OR BLEEDING  TENDERNESS IN MOUTH AND THROAT WITH OR WITHOUT PRESENCE OF ULCERS  *URINARY PROBLEMS  *BOWEL PROBLEMS  UNUSUAL RASH Items with * indicate a potential emergency and should be followed up as soon as possible.  Feel free to call the clinic you have any questions or concerns. The clinic phone number is [phone].  Please show the CHEMO ALERT CARD at check-in to the Emergency Department and triage nurse.     

## 2016-07-28 DIAGNOSIS — M5412 Radiculopathy, cervical region: Secondary | ICD-10-CM | POA: Diagnosis not present

## 2016-07-28 DIAGNOSIS — M542 Cervicalgia: Secondary | ICD-10-CM | POA: Diagnosis not present

## 2016-07-30 ENCOUNTER — Other Ambulatory Visit: Payer: Self-pay | Admitting: Medical Oncology

## 2016-07-30 DIAGNOSIS — C9000 Multiple myeloma not having achieved remission: Secondary | ICD-10-CM

## 2016-07-30 MED ORDER — REVLIMID 25 MG PO CAPS: ORAL_CAPSULE | ORAL | 0 refills | Status: DC

## 2016-07-31 ENCOUNTER — Ambulatory Visit (HOSPITAL_BASED_OUTPATIENT_CLINIC_OR_DEPARTMENT_OTHER): Payer: BLUE CROSS/BLUE SHIELD

## 2016-07-31 ENCOUNTER — Other Ambulatory Visit (HOSPITAL_BASED_OUTPATIENT_CLINIC_OR_DEPARTMENT_OTHER): Payer: BLUE CROSS/BLUE SHIELD

## 2016-07-31 DIAGNOSIS — C9000 Multiple myeloma not having achieved remission: Secondary | ICD-10-CM | POA: Diagnosis not present

## 2016-07-31 DIAGNOSIS — Z5112 Encounter for antineoplastic immunotherapy: Secondary | ICD-10-CM | POA: Diagnosis not present

## 2016-07-31 LAB — CBC WITH DIFFERENTIAL/PLATELET
BASO%: 0.7 % (ref 0.0–2.0)
Basophils Absolute: 0 10*3/uL (ref 0.0–0.1)
EOS ABS: 0.1 10*3/uL (ref 0.0–0.5)
EOS%: 5 % (ref 0.0–7.0)
HEMATOCRIT: 29.9 % — AB (ref 34.8–46.6)
HGB: 9.3 g/dL — ABNORMAL LOW (ref 11.6–15.9)
LYMPH#: 0.7 10*3/uL — AB (ref 0.9–3.3)
LYMPH%: 24.9 % (ref 14.0–49.7)
MCH: 27.8 pg (ref 25.1–34.0)
MCHC: 31.1 g/dL — AB (ref 31.5–36.0)
MCV: 89.5 fL (ref 79.5–101.0)
MONO#: 0.3 10*3/uL (ref 0.1–0.9)
MONO%: 11 % (ref 0.0–14.0)
NEUT#: 1.6 10*3/uL (ref 1.5–6.5)
NEUT%: 58.4 % (ref 38.4–76.8)
PLATELETS: 107 10*3/uL — AB (ref 145–400)
RBC: 3.34 10*6/uL — ABNORMAL LOW (ref 3.70–5.45)
RDW: 17.6 % — ABNORMAL HIGH (ref 11.2–14.5)
WBC: 2.8 10*3/uL — ABNORMAL LOW (ref 3.9–10.3)

## 2016-07-31 LAB — COMPREHENSIVE METABOLIC PANEL
ALT: 42 U/L (ref 0–55)
ANION GAP: 10 meq/L (ref 3–11)
AST: 40 U/L — ABNORMAL HIGH (ref 5–34)
Albumin: 3.4 g/dL — ABNORMAL LOW (ref 3.5–5.0)
Alkaline Phosphatase: 97 U/L (ref 40–150)
BILIRUBIN TOTAL: 0.69 mg/dL (ref 0.20–1.20)
BUN: 7.2 mg/dL (ref 7.0–26.0)
CALCIUM: 9.1 mg/dL (ref 8.4–10.4)
CHLORIDE: 106 meq/L (ref 98–109)
CO2: 24 mEq/L (ref 22–29)
CREATININE: 0.8 mg/dL (ref 0.6–1.1)
EGFR: 87 mL/min/{1.73_m2} — ABNORMAL LOW (ref >90)
Glucose: 120 mg/dl (ref 70–140)
Potassium: 3.6 mEq/L (ref 3.5–5.1)
Sodium: 140 mEq/L (ref 136–145)
TOTAL PROTEIN: 6.3 g/dL — AB (ref 6.4–8.3)

## 2016-07-31 MED ORDER — ONDANSETRON HCL 8 MG PO TABS
ORAL_TABLET | ORAL | Status: AC
Start: 2016-07-31 — End: 2016-07-31
  Filled 2016-07-31: qty 1

## 2016-07-31 MED ORDER — BORTEZOMIB CHEMO SQ INJECTION 3.5 MG (2.5MG/ML)
1.3000 mg/m2 | Freq: Once | INTRAMUSCULAR | Status: AC
  Administered 2016-07-31: 3 mg via SUBCUTANEOUS
  Filled 2016-07-31: qty 3

## 2016-07-31 MED ORDER — ONDANSETRON HCL 8 MG PO TABS
8.0000 mg | ORAL_TABLET | Freq: Once | ORAL | Status: AC
  Administered 2016-07-31: 8 mg via ORAL

## 2016-07-31 NOTE — Patient Instructions (Signed)
[facility] Discharge Instructions for Patients Receiving Chemotherapy  Today you received the following chemotherapy agents:  Velcade  To help prevent nausea and vomiting after your treatment, we encourage you to take your nausea medication as prescribed.   If you develop nausea and vomiting that is not controlled by your nausea medication, call the clinic.   BELOW ARE SYMPTOMS THAT SHOULD BE REPORTED IMMEDIATELY:  *FEVER GREATER THAN 100.5 F  *CHILLS WITH OR WITHOUT FEVER  NAUSEA AND VOMITING THAT IS NOT CONTROLLED WITH YOUR NAUSEA MEDICATION  *UNUSUAL SHORTNESS OF BREATH  *UNUSUAL BRUISING OR BLEEDING  TENDERNESS IN MOUTH AND THROAT WITH OR WITHOUT PRESENCE OF ULCERS  *URINARY PROBLEMS  *BOWEL PROBLEMS  UNUSUAL RASH Items with * indicate a potential emergency and should be followed up as soon as possible.  Feel free to call the clinic you have any questions or concerns. The clinic phone number is [phone].  Please show the CHEMO ALERT CARD at check-in to the Emergency Department and triage nurse.   

## 2016-08-01 DIAGNOSIS — E1165 Type 2 diabetes mellitus with hyperglycemia: Secondary | ICD-10-CM | POA: Diagnosis not present

## 2016-08-01 DIAGNOSIS — E7800 Pure hypercholesterolemia, unspecified: Secondary | ICD-10-CM | POA: Diagnosis not present

## 2016-08-01 DIAGNOSIS — G609 Hereditary and idiopathic neuropathy, unspecified: Secondary | ICD-10-CM | POA: Diagnosis not present

## 2016-08-01 DIAGNOSIS — E039 Hypothyroidism, unspecified: Secondary | ICD-10-CM | POA: Diagnosis not present

## 2016-08-02 DIAGNOSIS — G4733 Obstructive sleep apnea (adult) (pediatric): Secondary | ICD-10-CM | POA: Diagnosis not present

## 2016-08-07 ENCOUNTER — Other Ambulatory Visit (HOSPITAL_BASED_OUTPATIENT_CLINIC_OR_DEPARTMENT_OTHER): Payer: BLUE CROSS/BLUE SHIELD

## 2016-08-07 ENCOUNTER — Ambulatory Visit (HOSPITAL_BASED_OUTPATIENT_CLINIC_OR_DEPARTMENT_OTHER): Payer: BLUE CROSS/BLUE SHIELD

## 2016-08-07 DIAGNOSIS — Z5112 Encounter for antineoplastic immunotherapy: Secondary | ICD-10-CM

## 2016-08-07 DIAGNOSIS — C9000 Multiple myeloma not having achieved remission: Secondary | ICD-10-CM

## 2016-08-07 DIAGNOSIS — E1165 Type 2 diabetes mellitus with hyperglycemia: Secondary | ICD-10-CM | POA: Diagnosis not present

## 2016-08-07 DIAGNOSIS — E039 Hypothyroidism, unspecified: Secondary | ICD-10-CM | POA: Diagnosis not present

## 2016-08-07 LAB — COMPREHENSIVE METABOLIC PANEL
ALT: 34 U/L (ref 0–55)
ANION GAP: 9 meq/L (ref 3–11)
AST: 34 U/L (ref 5–34)
Albumin: 3.6 g/dL (ref 3.5–5.0)
Alkaline Phosphatase: 102 U/L (ref 40–150)
BILIRUBIN TOTAL: 0.62 mg/dL (ref 0.20–1.20)
BUN: 7.1 mg/dL (ref 7.0–26.0)
CALCIUM: 9.3 mg/dL (ref 8.4–10.4)
CHLORIDE: 107 meq/L (ref 98–109)
CO2: 21 meq/L — AB (ref 22–29)
CREATININE: 0.7 mg/dL (ref 0.6–1.1)
EGFR: >90 mL/min/{1.73_m2} (ref >90)
Glucose: 119 mg/dl (ref 70–140)
Potassium: 3.6 mEq/L (ref 3.5–5.1)
Sodium: 137 mEq/L (ref 136–145)
TOTAL PROTEIN: 6.6 g/dL (ref 6.4–8.3)

## 2016-08-07 LAB — CBC WITH DIFFERENTIAL/PLATELET
BASO%: 2.1 % — AB (ref 0.0–2.0)
Basophils Absolute: 0.1 10*3/uL (ref 0.0–0.1)
EOS ABS: 0.1 10*3/uL (ref 0.0–0.5)
EOS%: 1.6 % (ref 0.0–7.0)
HCT: 30.8 % — ABNORMAL LOW (ref 34.8–46.6)
HEMOGLOBIN: 10.1 g/dL — AB (ref 11.6–15.9)
LYMPH%: 23.7 % (ref 14.0–49.7)
MCH: 28.7 pg (ref 25.1–34.0)
MCHC: 32.9 g/dL (ref 31.5–36.0)
MCV: 87.2 fL (ref 79.5–101.0)
MONO#: 0.5 10*3/uL (ref 0.1–0.9)
MONO%: 13 % (ref 0.0–14.0)
NEUT%: 59.6 % (ref 38.4–76.8)
NEUTROS ABS: 2.5 10*3/uL (ref 1.5–6.5)
PLATELETS: 144 10*3/uL — AB (ref 145–400)
RBC: 3.53 10*6/uL — ABNORMAL LOW (ref 3.70–5.45)
RDW: 19.5 % — AB (ref 11.2–14.5)
WBC: 4.1 10*3/uL (ref 3.9–10.3)
lymph#: 1 10*3/uL (ref 0.9–3.3)

## 2016-08-07 MED ORDER — ONDANSETRON HCL 8 MG PO TABS
ORAL_TABLET | ORAL | Status: AC
  Filled 2016-08-07: qty 1

## 2016-08-07 MED ORDER — BORTEZOMIB CHEMO SQ INJECTION 3.5 MG (2.5MG/ML)
1.3000 mg/m2 | Freq: Once | INTRAMUSCULAR | Status: AC
  Administered 2016-08-07: 3 mg via SUBCUTANEOUS
  Filled 2016-08-07: qty 3

## 2016-08-07 MED ORDER — ONDANSETRON HCL 8 MG PO TABS
8.0000 mg | ORAL_TABLET | Freq: Once | ORAL | Status: AC
  Administered 2016-08-07: 8 mg via ORAL

## 2016-08-07 NOTE — Patient Instructions (Signed)
[facility] Discharge Instructions for Patients Receiving Chemotherapy  Today you received the following chemotherapy agents:  Velcade  To help prevent nausea and vomiting after your treatment, we encourage you to take your nausea medication as prescribed.   If you develop nausea and vomiting that is not controlled by your nausea medication, call the clinic.   BELOW ARE SYMPTOMS THAT SHOULD BE REPORTED IMMEDIATELY:  *FEVER GREATER THAN 100.5 F  *CHILLS WITH OR WITHOUT FEVER  NAUSEA AND VOMITING THAT IS NOT CONTROLLED WITH YOUR NAUSEA MEDICATION  *UNUSUAL SHORTNESS OF BREATH  *UNUSUAL BRUISING OR BLEEDING  TENDERNESS IN MOUTH AND THROAT WITH OR WITHOUT PRESENCE OF ULCERS  *URINARY PROBLEMS  *BOWEL PROBLEMS  UNUSUAL RASH Items with * indicate a potential emergency and should be followed up as soon as possible.  Feel free to call the clinic you have any questions or concerns. The clinic phone number is [phone].  Please show the CHEMO ALERT CARD at check-in to the Emergency Department and triage nurse.   

## 2016-08-14 ENCOUNTER — Other Ambulatory Visit (HOSPITAL_BASED_OUTPATIENT_CLINIC_OR_DEPARTMENT_OTHER): Payer: BLUE CROSS/BLUE SHIELD

## 2016-08-14 ENCOUNTER — Ambulatory Visit (HOSPITAL_BASED_OUTPATIENT_CLINIC_OR_DEPARTMENT_OTHER): Payer: BLUE CROSS/BLUE SHIELD

## 2016-08-14 DIAGNOSIS — Z5112 Encounter for antineoplastic immunotherapy: Secondary | ICD-10-CM | POA: Diagnosis not present

## 2016-08-14 DIAGNOSIS — C9000 Multiple myeloma not having achieved remission: Secondary | ICD-10-CM

## 2016-08-14 LAB — COMPREHENSIVE METABOLIC PANEL
ALBUMIN: 3.6 g/dL (ref 3.5–5.0)
ALK PHOS: 101 U/L (ref 40–150)
ALT: 31 U/L (ref 0–55)
ANION GAP: 10 meq/L (ref 3–11)
AST: 26 U/L (ref 5–34)
BILIRUBIN TOTAL: 0.85 mg/dL (ref 0.20–1.20)
BUN: 9 mg/dL (ref 7.0–26.0)
CO2: 23 mEq/L (ref 22–29)
Calcium: 9.5 mg/dL (ref 8.4–10.4)
Chloride: 107 mEq/L (ref 98–109)
Creatinine: 0.7 mg/dL (ref 0.6–1.1)
Glucose: 116 mg/dl (ref 70–140)
POTASSIUM: 3.4 meq/L — AB (ref 3.5–5.1)
Sodium: 140 mEq/L (ref 136–145)
TOTAL PROTEIN: 6.6 g/dL (ref 6.4–8.3)

## 2016-08-14 LAB — CBC WITH DIFFERENTIAL/PLATELET
BASO%: 0.3 % (ref 0.0–2.0)
BASOS ABS: 0 10*3/uL (ref 0.0–0.1)
EOS ABS: 0.1 10*3/uL (ref 0.0–0.5)
EOS%: 3.1 % (ref 0.0–7.0)
HCT: 29.7 % — ABNORMAL LOW (ref 34.8–46.6)
HEMOGLOBIN: 9.4 g/dL — AB (ref 11.6–15.9)
LYMPH%: 18 % (ref 14.0–49.7)
MCH: 28.2 pg (ref 25.1–34.0)
MCHC: 31.6 g/dL (ref 31.5–36.0)
MCV: 89.2 fL (ref 79.5–101.0)
MONO#: 0.2 10*3/uL (ref 0.1–0.9)
MONO%: 7.3 % (ref 0.0–14.0)
NEUT%: 71.3 % (ref 38.4–76.8)
NEUTROS ABS: 2.3 10*3/uL (ref 1.5–6.5)
PLATELETS: 124 10*3/uL — AB (ref 145–400)
RBC: 3.33 10*6/uL — AB (ref 3.70–5.45)
RDW: 17.4 % — ABNORMAL HIGH (ref 11.2–14.5)
WBC: 3.3 10*3/uL — AB (ref 3.9–10.3)
lymph#: 0.6 10*3/uL — ABNORMAL LOW (ref 0.9–3.3)

## 2016-08-14 LAB — LACTATE DEHYDROGENASE: LDH: 131 U/L (ref 125–245)

## 2016-08-14 MED ORDER — ONDANSETRON HCL 8 MG PO TABS
ORAL_TABLET | ORAL | Status: AC
  Filled 2016-08-14: qty 1

## 2016-08-14 MED ORDER — ONDANSETRON HCL 8 MG PO TABS
8.0000 mg | ORAL_TABLET | Freq: Once | ORAL | Status: AC
  Administered 2016-08-14: 8 mg via ORAL

## 2016-08-14 MED ORDER — BORTEZOMIB CHEMO SQ INJECTION 3.5 MG (2.5MG/ML)
1.3000 mg/m2 | Freq: Once | INTRAMUSCULAR | Status: AC
  Administered 2016-08-14: 3 mg via SUBCUTANEOUS
  Filled 2016-08-14: qty 3

## 2016-08-14 NOTE — Patient Instructions (Signed)
[facility] Discharge Instructions for Patients Receiving Chemotherapy  Today you received the following chemotherapy agents Velcade. To help prevent nausea and vomiting after your treatment, we encourage you to take your nausea medication as directed.  If you develop nausea and vomiting that is not controlled by your nausea medication, call the clinic.   BELOW ARE SYMPTOMS THAT SHOULD BE REPORTED IMMEDIATELY:  *FEVER GREATER THAN 100.5 F  *CHILLS WITH OR WITHOUT FEVER  NAUSEA AND VOMITING THAT IS NOT CONTROLLED WITH YOUR NAUSEA MEDICATION  *UNUSUAL SHORTNESS OF BREATH  *UNUSUAL BRUISING OR BLEEDING  TENDERNESS IN MOUTH AND THROAT WITH OR WITHOUT PRESENCE OF ULCERS  *URINARY PROBLEMS  *BOWEL PROBLEMS  UNUSUAL RASH Items with * indicate a potential emergency and should be followed up as soon as possible.  Feel free to call the clinic you have any questions or concerns. The clinic phone number is [phone].  Please show the CHEMO ALERT CARD at check-in to the Emergency Department and triage nurse.    

## 2016-08-15 LAB — KAPPA/LAMBDA LIGHT CHAINS
IG KAPPA FREE LIGHT CHAIN: 14.1 mg/L (ref 3.3–19.4)
Ig Lambda Free Light Chain: 493.1 mg/L — ABNORMAL HIGH (ref 5.7–26.3)
KAPPA/LAMBDA FLC RATIO: 0.03 — AB (ref 0.26–1.65)

## 2016-08-15 LAB — BETA 2 MICROGLOBULIN, SERUM: Beta-2: 1.9 mg/L (ref 0.6–2.4)

## 2016-08-15 LAB — IGG, IGA, IGM
IGA/IMMUNOGLOBULIN A, SERUM: 92 mg/dL (ref 87–352)
IGM (IMMUNOGLOBIN M), SRM: 607 mg/dL — AB (ref 26–217)
IgG, Qn, Serum: 379 mg/dL — ABNORMAL LOW (ref 700–1600)

## 2016-08-20 ENCOUNTER — Other Ambulatory Visit: Payer: Self-pay | Admitting: Internal Medicine

## 2016-08-20 DIAGNOSIS — C9000 Multiple myeloma not having achieved remission: Secondary | ICD-10-CM

## 2016-08-22 ENCOUNTER — Other Ambulatory Visit: Payer: Self-pay | Admitting: Medical Oncology

## 2016-08-22 ENCOUNTER — Ambulatory Visit (HOSPITAL_BASED_OUTPATIENT_CLINIC_OR_DEPARTMENT_OTHER): Payer: BLUE CROSS/BLUE SHIELD | Admitting: Internal Medicine

## 2016-08-22 ENCOUNTER — Telehealth: Payer: Self-pay | Admitting: Hematology and Oncology

## 2016-08-22 ENCOUNTER — Telehealth: Payer: Self-pay | Admitting: Medical Oncology

## 2016-08-22 ENCOUNTER — Encounter: Payer: Self-pay | Admitting: Internal Medicine

## 2016-08-22 ENCOUNTER — Other Ambulatory Visit (HOSPITAL_BASED_OUTPATIENT_CLINIC_OR_DEPARTMENT_OTHER): Payer: BLUE CROSS/BLUE SHIELD

## 2016-08-22 ENCOUNTER — Ambulatory Visit: Payer: BLUE CROSS/BLUE SHIELD

## 2016-08-22 ENCOUNTER — Other Ambulatory Visit: Payer: BLUE CROSS/BLUE SHIELD

## 2016-08-22 VITALS — BP 129/62 | HR 59 | Temp 98.1°F | Resp 20 | Ht 66.75 in | Wt 256.4 lb

## 2016-08-22 DIAGNOSIS — C9000 Multiple myeloma not having achieved remission: Secondary | ICD-10-CM

## 2016-08-22 DIAGNOSIS — E876 Hypokalemia: Secondary | ICD-10-CM

## 2016-08-22 DIAGNOSIS — F419 Anxiety disorder, unspecified: Secondary | ICD-10-CM

## 2016-08-22 DIAGNOSIS — R530 Neoplastic (malignant) related fatigue: Secondary | ICD-10-CM | POA: Diagnosis not present

## 2016-08-22 LAB — COMPREHENSIVE METABOLIC PANEL
ALT: 32 U/L (ref 0–55)
AST: 33 U/L (ref 5–34)
Albumin: 3.2 g/dL — ABNORMAL LOW (ref 3.5–5.0)
Alkaline Phosphatase: 112 U/L (ref 40–150)
Anion Gap: 11 mEq/L (ref 3–11)
BILIRUBIN TOTAL: 0.71 mg/dL (ref 0.20–1.20)
BUN: 5.4 mg/dL — ABNORMAL LOW (ref 7.0–26.0)
CO2: 27 meq/L (ref 22–29)
CREATININE: 0.7 mg/dL (ref 0.6–1.1)
Calcium: 8.9 mg/dL (ref 8.4–10.4)
Chloride: 104 mEq/L (ref 98–109)
EGFR: >90 mL/min/{1.73_m2} (ref >90)
GLUCOSE: 118 mg/dL (ref 70–140)
Potassium: 3 mEq/L — CL (ref 3.5–5.1)
SODIUM: 141 meq/L (ref 136–145)
TOTAL PROTEIN: 6.3 g/dL — AB (ref 6.4–8.3)

## 2016-08-22 LAB — CBC WITH DIFFERENTIAL/PLATELET
BASO%: 0.3 % (ref 0.0–2.0)
Basophils Absolute: 0 10*3/uL (ref 0.0–0.1)
EOS%: 3.8 % (ref 0.0–7.0)
Eosinophils Absolute: 0.1 10*3/uL (ref 0.0–0.5)
HCT: 28.6 % — ABNORMAL LOW (ref 34.8–46.6)
HGB: 9 g/dL — ABNORMAL LOW (ref 11.6–15.9)
LYMPH%: 23.4 % (ref 14.0–49.7)
MCH: 28.5 pg (ref 25.1–34.0)
MCHC: 31.5 g/dL (ref 31.5–36.0)
MCV: 90.5 fL (ref 79.5–101.0)
MONO#: 0.4 10*3/uL (ref 0.1–0.9)
MONO%: 13.3 % (ref 0.0–14.0)
NEUT%: 59.2 % (ref 38.4–76.8)
NEUTROS ABS: 1.7 10*3/uL (ref 1.5–6.5)
Platelets: 112 10*3/uL — ABNORMAL LOW (ref 145–400)
RBC: 3.16 10*6/uL — AB (ref 3.70–5.45)
RDW: 17.3 % — ABNORMAL HIGH (ref 11.2–14.5)
WBC: 2.9 10*3/uL — AB (ref 3.9–10.3)
lymph#: 0.7 10*3/uL — ABNORMAL LOW (ref 0.9–3.3)

## 2016-08-22 MED ORDER — POTASSIUM CHLORIDE CRYS ER 20 MEQ PO TBCR: 20.0000 meq | EXTENDED_RELEASE_TABLET | Freq: Every day | ORAL | 0 refills | Status: DC

## 2016-08-22 NOTE — Telephone Encounter (Signed)
called in kdur and pt norified

## 2016-08-22 NOTE — Telephone Encounter (Signed)
Gave pt avs and calendar for upcoming appts.  °

## 2016-08-22 NOTE — Progress Notes (Signed)
[place] [phone]   [phone]  OFFICE PROGRESS NOTE  [name], MD [place] [address]  DIAGNOSIS: Plasma cell dyscrasia initially diagnosed as MGUS in September 2010, with additional symptoms suggestive of POEMS syndrome.   PRIOR THERAPY:  1) Velcade 1.3 MG/M2 subcutaneously with Decadron 40 mg by mouth on a weekly basis. First cycle 11/24/2013. She status post 31 weekly doses of treatment. 2) Velcade 1.3 MG/M2 subcutaneously and weekly basis with Decadron 40 mg by mouth weekly. First dose 02/01/2015. Status post 28 cycles. 3) Revlimid 25 mg by mouth daily for 21 days every 4 weeks with weekly Decadron 20 mg. started in 11/27/2015. Status post 3 cycles discontinued secondary to lack of response.  CURRENT THERAPY: Systemic treatment with Velcade 1.3 MG/KG weekly, Revlimid 25 mg by mouth daily for 21 days every 4 weeks in addition to Decadron 20 mg by mouth weekly. First dose 03/06/2016. Status post 7 cycles. Cycle #7 started on 07/09/2016.  INTERVAL HISTORY: Brenda Conner 56 y.o. female returns to the clinic today for follow-up visit. The patient is feeling fine today with no specific complaints except for fatigue. She denied having any chest pain but has shortness of breath with exertion with no cough or hemoptysis. She has no nausea, vomiting, diarrhea or constipation. She denied having any fever or chills. She was tolerating her treatment with Velcade, Revlimid and Decadron well except for the increasing fatigue. The patient had repeat myeloma panel performed recently and she is here for evaluation and discussion of her lab results and treatment options.   MEDICAL HISTORY: Past Medical History:  Diagnosis Date  . Acid reflux   . Anxiety   . Asthma   . Cancer [facility])    waldenstroms/ macroglobinulemia  . Depression   . Depression   . Diabetes mellitus without complication ([place])   . Dysuria 02/28/2016  .  Hypercholesteremia   . Hypertension   . Hypothyroidism   . Macroglobulinemia ([place])    ? POEMS syndrome  . Multiple myeloma ([place])   . Obesity   . PONV (postoperative nausea and vomiting)   . Sleep apnea    CPAP at bedtime  . Sleep apnea     ALLERGIES:  is allergic to codeine; hydrocodone; lortab [hydrocodone-acetaminophen]; onion; shellfish allergy; and amoxicillin.  MEDICATIONS:  Current Outpatient Prescriptions  Medication Sig Dispense Refill  . acyclovir (ZOVIRAX) 400 MG tablet Take 1 tablet (400 mg total) by mouth 2 (two) times daily. 180 tablet 0  . ALPRAZolam (XANAX) 0.5 MG tablet Take 1 tablet (0.5 mg total) by mouth at bedtime as needed for anxiety. 30 tablet 0  . Azelastine HCl 0.15 % SOLN as needed.    . B-D UF III MINI PEN NEEDLES 31G X 5 MM MISC     . Blood Glucose Monitoring Suppl (ONE TOUCH ULTRA MINI) W/DEVICE KIT See admin instructions. Reported on 01/25/2015  0  . Bortezomib (VELCADE IJ) Inject as directed once a week.    . Cetirizine HCl (ZYRTEC ALLERGY PO) Take by mouth daily.    [name] dexamethasone (DECADRON) 4 MG tablet 5 tablet by mouth every week. Start with the first dose of chemotherapy. 40 tablet 3  . DiphenhydrAMINE HCl (BENADRYL ALLERGY PO) Take by mouth as needed.    [name] EPIPEN 2-PAK 0.3 MG/0.3ML SOAJ injection Reported on 06/21/2015  1  . Fexofenadine HCl (ALLEGRA ALLERGY PO) Take by mouth daily.    . Fluticasone-Salmeterol (ADVAIR) 100-50  MCG/DOSE AEPB Inhale 2 puffs into the lungs every 12 (twelve) hours.    [name] ibuprofen (ADVIL,MOTRIN) 100 MG tablet Take 100 mg by mouth every 6 (six) hours as needed. Reported on 05/31/2015    . insulin glargine (LANTUS) 100 UNIT/ML injection Inject 8 Units into the skin 2 (two) times a week. Reported on 02/18/2015    . levothyroxine (SYNTHROID, LEVOTHROID) 150 MCG tablet Take 150 mcg by mouth daily before breakfast.    . lisinopril (PRINIVIL,ZESTRIL) 10 MG tablet Take 10 mg by mouth daily.  3  . loperamide (IMODIUM) 2 MG capsule  Take 2 mg by mouth as needed for diarrhea or loose stools.    . metFORMIN (GLUCOPHAGE) 1000 MG tablet Take 1,000 mg by mouth 2 (two) times daily.  3  . NOVOLOG FLEXPEN 100 UNIT/ML FlexPen 3 Units daily as needed. Reported on 01/25/2015  6  . omeprazole (PRILOSEC) 40 MG capsule Take 40 mg by mouth 2 (two) times daily.     . ondansetron (ZOFRAN-ODT) 8 MG disintegrating tablet Take 1 tablet (8 mg total) by mouth every 8 (eight) hours as needed. Reported on 05/31/2015 20 tablet 2  . ONE TOUCH ULTRA TEST test strip See admin instructions. Reported on 01/25/2015  11  . pravastatin (PRAVACHOL) 40 MG tablet Take 40 mg by mouth every evening.     [name] PROAIR HFA 108 (90 Base) MCG/ACT inhaler Reported on 06/21/2015  1  . REVLIMID 25 MG capsule TAKE 1 CAPSULE (25MG) BY MOUTH ONCE DAILY 21 DAYS ON AND 7 DAYS OFF 21 capsule 0  . sertraline (ZOLOFT) 50 MG tablet TAKE 3 TABLETS BY MOUTH EVERY DAY 270 tablet 1  . verapamil (CALAN-SR) 240 MG CR tablet Take 240 mg by mouth 2 (two) times daily.    [name] warfarin (COUMADIN) 2 MG tablet Take 1 tablet (2 mg total) by mouth daily. 90 tablet 0   No current facility-administered medications for this visit.     SURGICAL HISTORY:  Past Surgical History:  Procedure Laterality Date  . ABLATION  09/2007   HTA and polyp resection  . BACK SURGERY  03/2004   herniation, L4-L5  . Bil Laprascopic knee surgery    . BONE MARROW BIOPSY     2011  . BONE MARROW BIOPSY  4/14  . CHOLECYSTECTOMY    . FOOT SURGERY  1998  . KNEE ARTHROSCOPY W/ MENISCAL REPAIR  10/10 ; 3/11  . LAPAROSCOPIC CHOLECYSTECTOMY  1997  . NASAL SINUS SURGERY  2004  . UTERINE FIBROID EMBOLIZATION      REVIEW OF SYSTEMS:  Constitutional: positive for fatigue Eyes: negative Ears, nose, mouth, throat, and face: negative Respiratory: positive for dyspnea on exertion Cardiovascular: negative Gastrointestinal: negative Genitourinary:negative Integument/breast: negative Hematologic/lymphatic:  negative Musculoskeletal:negative Neurological: negative Behavioral/Psych: negative Endocrine: negative Allergic/Immunologic: negative   PHYSICAL EXAMINATION: General appearance: alert, cooperative and no distress Head: Normocephalic, without obvious abnormality, atraumatic Neck: no adenopathy Lymph nodes: Cervical, supraclavicular, and axillary nodes normal. Resp: clear to auscultation bilaterally Back: symmetric, no curvature. ROM normal. No CVA tenderness. Cardio: regular rate and rhythm, S1, S2 normal, no murmur, click, rub or gallop GI: soft, non-tender; bowel sounds normal; no masses,  no organomegaly Extremities: extremities normal, atraumatic, no cyanosis or edema Neurologic: Alert and oriented X 3, normal strength and tone. Normal symmetric reflexes. Normal coordination and gait  ECOG PERFORMANCE STATUS: 1 - Symptomatic but completely ambulatory  Blood pressure 129/62, pulse (!) 59, temperature 98.1 F (36.7 C), temperature source Oral, resp. rate 20,  height 5' 6.75" (1.695 m), weight 256 lb 6.4 oz (116.3 kg), SpO2 99 %.  LABORATORY DATA: Lab Results  Component Value Date   WBC 2.9 (L) 08/22/2016   HGB 9.0 (L) 08/22/2016   HCT 28.6 (L) 08/22/2016   MCV 90.5 08/22/2016   PLT 112 (L) 08/22/2016      Chemistry      Component Value Date/Time   NA 140 08/14/2016 0818   K 3.4 (L) 08/14/2016 0818   CL 104 04/07/2012 1415   CO2 23 08/14/2016 0818   BUN 9.0 08/14/2016 0818   CREATININE 0.7 08/14/2016 0818      Component Value Date/Time   CALCIUM 9.5 08/14/2016 0818   ALKPHOS 101 08/14/2016 0818   AST 26 08/14/2016 0818   ALT 31 08/14/2016 0818   BILITOT 0.85 08/14/2016 0818      ASSESSMENT AND PLAN:  This is a very pleasant 56 years old white female with multiple myeloma status post several chemotherapy regimens and she is currently on treatment with subcutaneous weekly Velcade as well as Revlimid and DecadronStatus post 7 cycles. She has been tolerating her  treatment fairly well except for the increasing fatigue. She had repeat myeloma panel performed recently that showed further improvement of her condition. I discussed the lab result with the patient today. I recommended for her to take a break off chemotherapy because of the increasing fatigue. I will see her back for follow-up visit in 3 months for reevaluation with repeat myeloma panel. For anxiety, she will continue on Xanax on as-needed basis. She was advised to call immediately if she has any concerning symptoms in the interval. All questions were answered. The patient knows to call the clinic with any problems, questions or concerns. We can certainly see the patient much sooner if necessary.  Disclaimer: This note was dictated with voice recognition software. Similar sounding words can inadvertently be transcribed and may not be corrected upon review.

## 2016-08-27 MED ORDER — REVLIMID 25 MG PO CAPS: ORAL_CAPSULE | ORAL | 0 refills | Status: DC

## 2016-08-27 NOTE — Addendum Note (Signed)
Addended by: Ardeen Garland on: 08/27/2016 02:35 PM   Modules accepted: Orders

## 2016-09-01 DIAGNOSIS — M542 Cervicalgia: Secondary | ICD-10-CM | POA: Diagnosis not present

## 2016-09-01 DIAGNOSIS — M5412 Radiculopathy, cervical region: Secondary | ICD-10-CM | POA: Diagnosis not present

## 2016-09-18 DIAGNOSIS — M542 Cervicalgia: Secondary | ICD-10-CM | POA: Diagnosis not present

## 2016-09-18 DIAGNOSIS — M6281 Muscle weakness (generalized): Secondary | ICD-10-CM | POA: Diagnosis not present

## 2016-09-18 DIAGNOSIS — M5412 Radiculopathy, cervical region: Secondary | ICD-10-CM | POA: Diagnosis not present

## 2016-09-19 DIAGNOSIS — Z1231 Encounter for screening mammogram for malignant neoplasm of breast: Secondary | ICD-10-CM | POA: Diagnosis not present

## 2016-09-19 DIAGNOSIS — Z780 Asymptomatic menopausal state: Secondary | ICD-10-CM | POA: Diagnosis not present

## 2016-09-24 ENCOUNTER — Other Ambulatory Visit: Payer: Self-pay | Admitting: Obstetrics & Gynecology

## 2016-09-24 NOTE — Telephone Encounter (Signed)
Medication refill request: Zoloft Last AEX:  06-05-16  Next AEX: 08-23-17  Last MMG (if hormonal medication request): 09-15-14 WNL Refill authorized: please advise

## 2016-10-02 ENCOUNTER — Telehealth: Payer: Self-pay | Admitting: *Deleted

## 2016-10-02 DIAGNOSIS — M5412 Radiculopathy, cervical region: Secondary | ICD-10-CM | POA: Diagnosis not present

## 2016-10-02 DIAGNOSIS — M542 Cervicalgia: Secondary | ICD-10-CM | POA: Diagnosis not present

## 2016-10-02 NOTE — Telephone Encounter (Signed)
Detailed message left per DPR letting patient know BMD was normal. Advised patient to return call if she had questions.

## 2016-10-08 ENCOUNTER — Encounter: Payer: Self-pay | Admitting: Obstetrics & Gynecology

## 2016-10-13 DIAGNOSIS — M542 Cervicalgia: Secondary | ICD-10-CM | POA: Diagnosis not present

## 2016-10-13 DIAGNOSIS — M5412 Radiculopathy, cervical region: Secondary | ICD-10-CM | POA: Diagnosis not present

## 2016-10-13 DIAGNOSIS — M25511 Pain in right shoulder: Secondary | ICD-10-CM | POA: Diagnosis not present

## 2016-10-18 ENCOUNTER — Telehealth: Payer: Self-pay | Admitting: Pharmacy Technician

## 2016-10-18 NOTE — Telephone Encounter (Signed)
Oral Oncology Patient Advocate Encounter  Received notification from [employer] that the existing Revlimid prior authorization requires reauthorization.  PA submitted via fax (fax# [phone]) Status is pending  Oral Oncology Clinic will continue to follow.  Fabio Asa. Melynda Keller, [place] Patient [place] [phone] 10/18/2016 8:22 AM

## 2016-10-24 NOTE — Telephone Encounter (Signed)
Oral Oncology Patient Advocate Encounter  Prior Authorization for Revlimid has been approved.    PA# [id number] Effective dates: 10/18/2016 through 10/18/2017  Fabio Asa. Melynda Keller, [place] Patient [place] [phone] 10/24/2016 12:03 PM

## 2016-11-02 DIAGNOSIS — E039 Hypothyroidism, unspecified: Secondary | ICD-10-CM | POA: Diagnosis not present

## 2016-11-03 ENCOUNTER — Other Ambulatory Visit: Payer: Self-pay | Admitting: Internal Medicine

## 2016-11-03 DIAGNOSIS — C9000 Multiple myeloma not having achieved remission: Secondary | ICD-10-CM

## 2016-11-03 DIAGNOSIS — M5412 Radiculopathy, cervical region: Secondary | ICD-10-CM | POA: Diagnosis not present

## 2016-11-03 DIAGNOSIS — M542 Cervicalgia: Secondary | ICD-10-CM | POA: Diagnosis not present

## 2016-11-13 ENCOUNTER — Other Ambulatory Visit: Payer: Self-pay | Admitting: *Deleted

## 2016-11-13 DIAGNOSIS — C9000 Multiple myeloma not having achieved remission: Secondary | ICD-10-CM

## 2016-11-13 MED ORDER — WARFARIN SODIUM 2 MG PO TABS: 2.0000 mg | ORAL_TABLET | Freq: Every day | ORAL | 0 refills | Status: DC

## 2016-11-14 ENCOUNTER — Other Ambulatory Visit (HOSPITAL_BASED_OUTPATIENT_CLINIC_OR_DEPARTMENT_OTHER): Payer: BLUE CROSS/BLUE SHIELD

## 2016-11-14 DIAGNOSIS — C9000 Multiple myeloma not having achieved remission: Secondary | ICD-10-CM | POA: Diagnosis not present

## 2016-11-14 LAB — CBC WITH DIFFERENTIAL/PLATELET
BASO%: 0.5 % (ref 0.0–2.0)
BASOS ABS: 0 10*3/uL (ref 0.0–0.1)
EOS%: 2.7 % (ref 0.0–7.0)
Eosinophils Absolute: 0.1 10*3/uL (ref 0.0–0.5)
HEMATOCRIT: 29.2 % — AB (ref 34.8–46.6)
HGB: 9.3 g/dL — ABNORMAL LOW (ref 11.6–15.9)
LYMPH%: 16.8 % (ref 14.0–49.7)
MCH: 28.2 pg (ref 25.1–34.0)
MCHC: 31.8 g/dL (ref 31.5–36.0)
MCV: 88.5 fL (ref 79.5–101.0)
MONO#: 0.3 10*3/uL (ref 0.1–0.9)
MONO%: 8.2 % (ref 0.0–14.0)
NEUT#: 2.6 10*3/uL (ref 1.5–6.5)
NEUT%: 71.8 % (ref 38.4–76.8)
Platelets: 138 10*3/uL — ABNORMAL LOW (ref 145–400)
RBC: 3.3 10*6/uL — ABNORMAL LOW (ref 3.70–5.45)
RDW: 14.6 % — ABNORMAL HIGH (ref 11.2–14.5)
WBC: 3.7 10*3/uL — ABNORMAL LOW (ref 3.9–10.3)
lymph#: 0.6 10*3/uL — ABNORMAL LOW (ref 0.9–3.3)

## 2016-11-14 LAB — COMPREHENSIVE METABOLIC PANEL
ALT: 24 U/L (ref 0–55)
AST: 28 U/L (ref 5–34)
Albumin: 3.9 g/dL (ref 3.5–5.0)
Alkaline Phosphatase: 94 U/L (ref 40–150)
Anion Gap: 10 mEq/L (ref 3–11)
BILIRUBIN TOTAL: 0.41 mg/dL (ref 0.20–1.20)
BUN: 9.1 mg/dL (ref 7.0–26.0)
CO2: 23 meq/L (ref 22–29)
CREATININE: 0.7 mg/dL (ref 0.6–1.1)
Calcium: 9.6 mg/dL (ref 8.4–10.4)
Chloride: 105 mEq/L (ref 98–109)
EGFR: >60 mL/min/{1.73_m2} (ref >60)
GLUCOSE: 110 mg/dL (ref 70–140)
Potassium: 4.3 mEq/L (ref 3.5–5.1)
SODIUM: 138 meq/L (ref 136–145)
TOTAL PROTEIN: 7 g/dL (ref 6.4–8.3)

## 2016-11-14 LAB — LACTATE DEHYDROGENASE: LDH: 124 U/L — ABNORMAL LOW (ref 125–245)

## 2016-11-15 LAB — KAPPA/LAMBDA LIGHT CHAINS
IG KAPPA FREE LIGHT CHAIN: 8.8 mg/L (ref 3.3–19.4)
IG LAMBDA FREE LIGHT CHAIN: 854.3 mg/L — AB (ref 5.7–26.3)
Kappa/Lambda FluidC Ratio: 0.01 — ABNORMAL LOW (ref 0.26–1.65)

## 2016-11-15 LAB — BETA 2 MICROGLOBULIN, SERUM: Beta-2: 2.2 mg/L (ref 0.6–2.4)

## 2016-11-15 LAB — IGG, IGA, IGM
IgA, Qn, Serum: 82 mg/dL — ABNORMAL LOW (ref 87–352)
IgG, Qn, Serum: 464 mg/dL — ABNORMAL LOW (ref 700–1600)
IgM, Qn, Serum: 596 mg/dL — ABNORMAL HIGH (ref 26–217)

## 2016-11-17 DIAGNOSIS — M5412 Radiculopathy, cervical region: Secondary | ICD-10-CM | POA: Diagnosis not present

## 2016-11-17 DIAGNOSIS — M542 Cervicalgia: Secondary | ICD-10-CM | POA: Diagnosis not present

## 2016-11-20 ENCOUNTER — Other Ambulatory Visit: Payer: BLUE CROSS/BLUE SHIELD

## 2016-11-22 ENCOUNTER — Encounter: Payer: Self-pay | Admitting: Internal Medicine

## 2016-11-22 ENCOUNTER — Ambulatory Visit: Payer: BLUE CROSS/BLUE SHIELD | Admitting: Internal Medicine

## 2016-11-22 ENCOUNTER — Telehealth: Payer: Self-pay | Admitting: Internal Medicine

## 2016-11-22 VITALS — BP 157/78 | HR 63 | Temp 98.0°F | Resp 18 | Ht 66.75 in | Wt 255.2 lb

## 2016-11-22 DIAGNOSIS — C9000 Multiple myeloma not having achieved remission: Secondary | ICD-10-CM | POA: Diagnosis not present

## 2016-11-22 NOTE — Telephone Encounter (Signed)
Gave patient AVS and calendar of upcoming January appointments °

## 2016-11-22 NOTE — Progress Notes (Signed)
[place] [phone]   [phone]  OFFICE PROGRESS NOTE  Brenda Kern, MD [place] [address]  DIAGNOSIS: Plasma cell dyscrasia initially diagnosed as MGUS in September 2010, with additional symptoms suggestive of POEMS syndrome.   PRIOR THERAPY:  1) Velcade 1.3 MG/M2 subcutaneously with Decadron 40 mg by mouth on a weekly basis. First cycle 11/24/2013. She status post 31 weekly doses of treatment. 2) Velcade 1.3 MG/M2 subcutaneously and weekly basis with Decadron 40 mg by mouth weekly. First dose 02/01/2015. Status post 28 cycles. 3) Revlimid 25 mg by mouth daily for 21 days every 4 weeks with weekly Decadron 20 mg. started in 11/27/2015. Status post 3 cycles discontinued secondary to lack of response.  CURRENT THERAPY: Systemic treatment with Velcade 1.3 MG/KG weekly, Revlimid 25 mg by mouth daily for 21 days every 4 weeks in addition to Decadron 20 mg by mouth weekly. First dose 03/06/2016. Status post 7 cycles. Cycle #7 started on 07/09/2016.  INTERVAL HISTORY: Brenda Conner 56 y.o. female returns to the clinic today for 17-monthfollow-up visit.  The patient is feeling fine today with no specific complaints except for arthralgia and pain from carpal tunnel.  She was seen by her primary care physician and was started on meloxicam.  She denied having any chest pain, shortness of breath, cough or hemoptysis.  She denied having any fever or chills.  She has no nausea, vomiting, diarrhea or constipation.  She has been on observation for the last 3 months.  She had repeat myeloma panel and she is here today for evaluation and discussion of her lab results.    MEDICAL HISTORY: Past Medical History:  Diagnosis Date  . Acid reflux   . Anxiety   . Asthma   . Cancer ([facility]    waldenstroms/ macroglobinulemia  . Depression   . Depression   . [place]   . Dysuria 02/28/2016  .  Hypercholesteremia   . Hypertension   . Hypothyroidism   . Macroglobulinemia ([place]    ? POEMS syndrome  . Multiple myeloma ([place]   . Obesity   . PONV (postoperative nausea and vomiting)   . Sleep apnea    CPAP at bedtime  . Sleep apnea     ALLERGIES:  is allergic to codeine; hydrocodone; lortab [hydrocodone-acetaminophen]; onion; shellfish allergy; and amoxicillin.  MEDICATIONS:  Current Outpatient Medications  Medication Sig Dispense Refill  . ALPRAZolam (XANAX) 0.5 MG tablet Take 1 tablet (0.5 mg total) by mouth at bedtime as needed for anxiety. 30 tablet 0  . Azelastine HCl 0.15 % SOLN as needed.    . B-D UF III MINI PEN NEEDLES 31G X 5 MM MISC     . Blood Glucose Monitoring Suppl (ONE TOUCH ULTRA MINI) W/DEVICE KIT See admin instructions. Reported on 01/25/2015  0  . Cetirizine HCl (ZYRTEC ALLERGY PO) Take by mouth daily.    .Marland KitchenFexofenadine HCl (ALLEGRA ALLERGY PO) Take by mouth daily.    . Fluticasone-Salmeterol (ADVAIR) 100-50 MCG/DOSE AEPB Inhale 2 puffs into the lungs every 12 (twelve) hours.    .Marland Kitchenibuprofen (ADVIL,MOTRIN) 100 MG tablet Take 100 mg by mouth every 6 (six) hours as needed. Reported on 05/31/2015    . levothyroxine (SYNTHROID, LEVOTHROID) 150 MCG tablet Take 150 mcg by mouth daily before breakfast.    . lisinopril (PRINIVIL,ZESTRIL) 10 MG tablet Take 10 mg by mouth daily.  3  . meloxicam (MOBIC) 15  MG tablet Take 15 mg daily by mouth.  3  . metFORMIN (GLUCOPHAGE) 1000 MG tablet Take 1,000 mg by mouth 2 (two) times daily.  3  . omeprazole (PRILOSEC) 40 MG capsule Take 40 mg by mouth 2 (two) times daily.     . ONE TOUCH ULTRA TEST test strip See admin instructions. Reported on 01/25/2015  11  . potassium chloride SA (K-DUR,KLOR-CON) 20 MEQ tablet Take 1 tablet (20 mEq total) by mouth daily. 7 tablet 0  . pravastatin (PRAVACHOL) 40 MG tablet Take 40 mg by mouth every evening.     [name] PROAIR HFA 108 (90 Base) MCG/ACT inhaler Reported on 06/21/2015  1  . sertraline  (ZOLOFT) 50 MG tablet TAKE 3 TABLETS BY MOUTH EVERY DAY 270 tablet 3  . verapamil (CALAN-SR) 240 MG CR tablet Take 240 mg by mouth 2 (two) times daily.    [name] acyclovir (ZOVIRAX) 400 MG tablet Take 1 tablet (400 mg total) by mouth 2 (two) times daily. (Patient not taking: Reported on 11/22/2016) 180 tablet 0  . Bortezomib (VELCADE IJ) Inject as directed once a week.    [name] dexamethasone (DECADRON) 4 MG tablet 5 tablet by mouth every week. Start with the first dose of chemotherapy. (Patient not taking: Reported on 11/22/2016) 40 tablet 3  . DiphenhydrAMINE HCl (BENADRYL ALLERGY PO) Take by mouth as needed.    [name] EPIPEN 2-PAK 0.3 MG/0.3ML SOAJ injection Reported on 06/21/2015  1  . insulin glargine (LANTUS) 100 UNIT/ML injection Inject 8 Units into the skin 2 (two) times a week. Reported on 02/18/2015    . loperamide (IMODIUM) 2 MG capsule Take 2 mg by mouth as needed for diarrhea or loose stools.    [name] NOVOLOG FLEXPEN 100 UNIT/ML FlexPen 3 Units daily as needed. Reported on 01/25/2015  6  . ondansetron (ZOFRAN-ODT) 8 MG disintegrating tablet Take 1 tablet (8 mg total) by mouth every 8 (eight) hours as needed. Reported on 05/31/2015 (Patient not taking: Reported on 11/22/2016) 20 tablet 2  . REVLIMID 25 MG capsule TAKE 1 CAPSULE (25MG) BY MOUTH ONCE DAILY 21 DAYS ON AND 7 DAYS OFF.08/27/16  auth number [id number]. Adult female not of childbearing potential. (Patient not taking: Reported on 11/22/2016) 21 capsule 0  . warfarin (COUMADIN) 2 MG tablet Take 1 tablet (2 mg total) daily by mouth. (Patient not taking: Reported on 11/22/2016) 90 tablet 0   No current facility-administered medications for this visit.     SURGICAL HISTORY:  Past Surgical History:  Procedure Laterality Date  . ABLATION  09/2007   HTA and polyp resection  . BACK SURGERY  03/2004   herniation, L4-L5  . Bil Laprascopic knee surgery    . BONE MARROW BIOPSY     2011  . BONE MARROW BIOPSY  4/14  . CHOLECYSTECTOMY    . FOOT SURGERY  1998    . KNEE ARTHROSCOPY W/ MENISCAL REPAIR  10/10 ; 3/11  . LAPAROSCOPIC CHOLECYSTECTOMY  1997  . NASAL SINUS SURGERY  2004  . UTERINE FIBROID EMBOLIZATION      REVIEW OF SYSTEMS:  A comprehensive review of systems was negative except for: Musculoskeletal: positive for arthralgias   PHYSICAL EXAMINATION: General appearance: alert, cooperative and no distress Head: Normocephalic, without obvious abnormality, atraumatic Neck: no adenopathy Lymph nodes: Cervical, supraclavicular, and axillary nodes normal. Resp: clear to auscultation bilaterally Back: symmetric, no curvature. ROM normal. No CVA tenderness. Cardio: regular rate and rhythm, S1, S2 normal, no murmur, click, rub or gallop GI:  soft, non-tender; bowel sounds normal; no masses,  no organomegaly Extremities: extremities normal, atraumatic, no cyanosis or edema  ECOG PERFORMANCE STATUS: 1 - Symptomatic but completely ambulatory  Blood pressure (!) 157/78, pulse 63, temperature 98 F (36.7 C), temperature source Oral, resp. rate 18, height 5' 6.75" (1.695 m), weight 255 lb 3.2 oz (115.8 kg), SpO2 100 %.  LABORATORY DATA: Lab Results  Component Value Date   WBC 3.7 (L) 11/14/2016   HGB 9.3 (L) 11/14/2016   HCT 29.2 (L) 11/14/2016   MCV 88.5 11/14/2016   PLT 138 (L) 11/14/2016      Chemistry      Component Value Date/Time   NA 138 11/14/2016 0824   K 4.3 11/14/2016 0824   CL 104 04/07/2012 1415   CO2 23 11/14/2016 0824   BUN 9.1 11/14/2016 0824   CREATININE 0.7 11/14/2016 0824      Component Value Date/Time   CALCIUM 9.6 11/14/2016 0824   ALKPHOS 94 11/14/2016 0824   AST 28 11/14/2016 0824   ALT 24 11/14/2016 0824   BILITOT 0.41 11/14/2016 0824      ASSESSMENT AND PLAN:  This is a very pleasant 56 years old white female with multiple myeloma status post several chemotherapy regimens. The patient has been on treatment with subcutaneous weekly Velcade as well as Revlimid and DecadronStatus post 7 cycles. She has  been tolerating her treatment fairly well except for the increasing fatigue. She is currently on observation and she is feeling fine. Her myeloma panel showed increase in the free lambda light chain but the patient is currently asymptomatic. I discussed the lab results with the patient and give her the option of resuming treatment versus waiting for 2 months for her to enjoy the holiday with her family before repeating myeloma panel and consideration of treatment again if needed. The patient is interested in staying on observation for now. I will see her back for follow-up visit in 2 months for evaluation with repeat myeloma panel. She was advised to call immediately if she has any concerning symptoms in the interval. All questions were answered. The patient knows to call the clinic with any problems, questions or concerns. We can certainly see the patient much sooner if necessary.  Disclaimer: This note was dictated with voice recognition software. Similar sounding words can inadvertently be transcribed and may not be corrected upon review.

## 2016-11-24 DIAGNOSIS — M542 Cervicalgia: Secondary | ICD-10-CM | POA: Diagnosis not present

## 2016-11-24 DIAGNOSIS — M5412 Radiculopathy, cervical region: Secondary | ICD-10-CM | POA: Diagnosis not present

## 2016-11-24 DIAGNOSIS — M4003 Postural kyphosis, cervicothoracic region: Secondary | ICD-10-CM | POA: Diagnosis not present

## 2016-12-15 DIAGNOSIS — M5412 Radiculopathy, cervical region: Secondary | ICD-10-CM | POA: Diagnosis not present

## 2016-12-15 DIAGNOSIS — M542 Cervicalgia: Secondary | ICD-10-CM | POA: Diagnosis not present

## 2016-12-15 DIAGNOSIS — M4003 Postural kyphosis, cervicothoracic region: Secondary | ICD-10-CM | POA: Diagnosis not present

## 2017-01-21 ENCOUNTER — Inpatient Hospital Stay: Payer: BLUE CROSS/BLUE SHIELD | Attending: Internal Medicine

## 2017-01-21 DIAGNOSIS — Z5112 Encounter for antineoplastic immunotherapy: Secondary | ICD-10-CM | POA: Insufficient documentation

## 2017-01-21 DIAGNOSIS — R530 Neoplastic (malignant) related fatigue: Secondary | ICD-10-CM | POA: Diagnosis not present

## 2017-01-21 DIAGNOSIS — I10 Essential (primary) hypertension: Secondary | ICD-10-CM | POA: Insufficient documentation

## 2017-01-21 DIAGNOSIS — C9000 Multiple myeloma not having achieved remission: Secondary | ICD-10-CM | POA: Diagnosis not present

## 2017-01-21 DIAGNOSIS — Z9221 Personal history of antineoplastic chemotherapy: Secondary | ICD-10-CM | POA: Diagnosis not present

## 2017-01-21 LAB — COMPREHENSIVE METABOLIC PANEL
ALBUMIN: 4 g/dL (ref 3.5–5.0)
ALT: 33 U/L (ref 0–55)
ANION GAP: 12 — AB (ref 3–11)
AST: 29 U/L (ref 5–34)
Alkaline Phosphatase: 123 U/L (ref 40–150)
BUN: 10 mg/dL (ref 7–26)
CO2: 23 mmol/L (ref 22–29)
Calcium: 9.6 mg/dL (ref 8.4–10.4)
Chloride: 105 mmol/L (ref 98–109)
Creatinine, Ser: 0.77 mg/dL (ref 0.60–1.10)
GFR calc Af Amer: >60 mL/min (ref >60)
GFR calc non Af Amer: >60 mL/min (ref >60)
GLUCOSE: 106 mg/dL (ref 70–140)
POTASSIUM: 4.4 mmol/L (ref 3.3–4.7)
SODIUM: 140 mmol/L (ref 136–145)
Total Bilirubin: 0.4 mg/dL (ref 0.2–1.2)
Total Protein: 6.9 g/dL (ref 6.4–8.3)

## 2017-01-21 LAB — CBC WITH DIFFERENTIAL/PLATELET
Basophils Absolute: 0 10*3/uL (ref 0.0–0.1)
Basophils Relative: 1 %
Eosinophils Absolute: 0.1 10*3/uL (ref 0.0–0.5)
Eosinophils Relative: 3 %
HEMATOCRIT: 30.4 % — AB (ref 34.8–46.6)
Hemoglobin: 10 g/dL — ABNORMAL LOW (ref 11.6–15.9)
LYMPHS ABS: 0.9 10*3/uL (ref 0.9–3.3)
LYMPHS PCT: 21 %
MCH: 27.9 pg (ref 25.1–34.0)
MCHC: 33 g/dL (ref 31.5–36.0)
MCV: 84.6 fL (ref 79.5–101.0)
MONO ABS: 0.4 10*3/uL (ref 0.1–0.9)
MONOS PCT: 8 %
NEUTROS ABS: 3 10*3/uL (ref 1.5–6.5)
Neutrophils Relative %: 67 %
Platelets: 161 10*3/uL (ref 145–400)
RBC: 3.6 MIL/uL — ABNORMAL LOW (ref 3.70–5.45)
RDW: 15.3 % (ref 11.2–16.1)
WBC: 4.5 10*3/uL (ref 3.9–10.3)

## 2017-01-21 LAB — LACTATE DEHYDROGENASE: LDH: 128 U/L (ref 125–245)

## 2017-01-22 LAB — KAPPA/LAMBDA LIGHT CHAINS
KAPPA, LAMDA LIGHT CHAIN RATIO: 0.01 — AB (ref 0.26–1.65)
Kappa free light chain: 9.6 mg/L (ref 3.3–19.4)
Lambda free light chains: 970.8 mg/L — ABNORMAL HIGH (ref 5.7–26.3)

## 2017-01-22 LAB — BETA 2 MICROGLOBULIN, SERUM: BETA 2 MICROGLOBULIN: 2 mg/L (ref 0.6–2.4)

## 2017-01-22 LAB — IGG, IGA, IGM
IGA: 80 mg/dL — AB (ref 87–352)
IGG (IMMUNOGLOBIN G), SERUM: 528 mg/dL — AB (ref 700–1600)
IgM (Immunoglobulin M), Srm: 724 mg/dL — ABNORMAL HIGH (ref 26–217)

## 2017-01-28 ENCOUNTER — Encounter: Payer: Self-pay | Admitting: Internal Medicine

## 2017-01-28 ENCOUNTER — Inpatient Hospital Stay: Payer: BLUE CROSS/BLUE SHIELD | Admitting: Internal Medicine

## 2017-01-28 ENCOUNTER — Telehealth: Payer: Self-pay | Admitting: Internal Medicine

## 2017-01-28 VITALS — BP 157/68 | HR 64 | Temp 98.0°F | Resp 18 | Ht 66.75 in | Wt 262.9 lb

## 2017-01-28 DIAGNOSIS — Z5112 Encounter for antineoplastic immunotherapy: Secondary | ICD-10-CM | POA: Diagnosis not present

## 2017-01-28 DIAGNOSIS — E8809 Other disorders of plasma-protein metabolism, not elsewhere classified: Secondary | ICD-10-CM

## 2017-01-28 DIAGNOSIS — C9000 Multiple myeloma not having achieved remission: Secondary | ICD-10-CM | POA: Diagnosis not present

## 2017-01-28 DIAGNOSIS — I10 Essential (primary) hypertension: Secondary | ICD-10-CM | POA: Diagnosis not present

## 2017-01-28 DIAGNOSIS — Z5111 Encounter for antineoplastic chemotherapy: Secondary | ICD-10-CM

## 2017-01-28 DIAGNOSIS — Z9221 Personal history of antineoplastic chemotherapy: Secondary | ICD-10-CM | POA: Diagnosis not present

## 2017-01-28 DIAGNOSIS — R530 Neoplastic (malignant) related fatigue: Secondary | ICD-10-CM | POA: Diagnosis not present

## 2017-01-28 MED ORDER — REVLIMID 25 MG PO CAPS: ORAL_CAPSULE | ORAL | 0 refills | Status: DC

## 2017-01-28 MED ORDER — ACYCLOVIR 400 MG PO TABS: 400.0000 mg | ORAL_TABLET | Freq: Two times a day (BID) | ORAL | 0 refills | Status: DC

## 2017-01-28 MED ORDER — DEXAMETHASONE 4 MG PO TABS: ORAL_TABLET | ORAL | 3 refills | Status: DC

## 2017-01-28 MED ORDER — WARFARIN SODIUM 2 MG PO TABS: 2.0000 mg | ORAL_TABLET | Freq: Every day | ORAL | 0 refills | Status: DC

## 2017-01-28 NOTE — Progress Notes (Signed)
Revlimid faxed to [place] Authorization# [id number]

## 2017-01-28 NOTE — Progress Notes (Signed)
[facility] [phone]   [phone]  OFFICE PROGRESS NOTE  [name], MD [place] [address]  DIAGNOSIS: Plasma cell dyscrasia initially diagnosed as MGUS in September 2010, with additional symptoms suggestive of POEMS syndrome.   PRIOR THERAPY:  1) Velcade 1.3 MG/M2 subcutaneously with Decadron 40 mg by mouth on a weekly basis. First cycle 11/24/2013. She status post 31 weekly doses of treatment. 2) Velcade 1.3 MG/M2 subcutaneously and weekly basis with Decadron 40 mg by mouth weekly. First dose 02/01/2015. Status post 28 cycles. 3) Revlimid 25 mg by mouth daily for 21 days every 4 weeks with weekly Decadron 20 mg. started in 11/27/2015. Status post 3 cycles discontinued secondary to lack of response.  CURRENT THERAPY: Systemic treatment with Velcade 1.3 MG/KG weekly, Revlimid 25 mg by mouth daily for 21 days every 4 weeks in addition to Decadron 20 mg by mouth weekly. First dose 03/06/2016. Status post 7 cycles. Cycle #7 started on 07/09/2016.  INTERVAL HISTORY: Brenda Conner 57 y.o. female returns to the clinic today for follow-up visit.  The patient enjoyed the last few months being on observation fairly well.  She denied having any chest pain, shortness breath, cough or hemoptysis.  She continues to have pain and arthritis in her wrist secondary to carpal tunnel.  She denied having any fever or chills.  She has no nausea, vomiting, diarrhea or constipation.  She denied having any weight loss or night sweats.  She had repeat myeloma panel performed recently and she is here for evaluation and discussion of her lab results and treatment options.    MEDICAL HISTORY: Past Medical History:  Diagnosis Date  . Acid reflux   . Anxiety   . Asthma   . Cancer [facility])    waldenstroms/ macroglobinulemia  . Depression   . Depression   . Diabetes mellitus without complication ([place])   . Dysuria 02/28/2016  . Hypercholesteremia     . Hypertension   . Hypothyroidism   . Macroglobulinemia ([place])    ? POEMS syndrome  . Multiple myeloma ([place])   . Obesity   . PONV (postoperative nausea and vomiting)   . Sleep apnea    CPAP at bedtime  . Sleep apnea     ALLERGIES:  is allergic to codeine; hydrocodone; lortab [hydrocodone-acetaminophen]; onion; shellfish allergy; and amoxicillin.  MEDICATIONS:  Current Outpatient Medications  Medication Sig Dispense Refill  . acyclovir (ZOVIRAX) 400 MG tablet Take 1 tablet (400 mg total) by mouth 2 (two) times daily. (Patient not taking: Reported on 11/22/2016) 180 tablet 0  . ALPRAZolam (XANAX) 0.5 MG tablet Take 1 tablet (0.5 mg total) by mouth at bedtime as needed for anxiety. 30 tablet 0  . Azelastine HCl 0.15 % SOLN as needed.    . B-D UF III MINI PEN NEEDLES 31G X 5 MM MISC     . Blood Glucose Monitoring Suppl (ONE TOUCH ULTRA MINI) W/DEVICE KIT See admin instructions. Reported on 01/25/2015  0  . Bortezomib (VELCADE IJ) Inject as directed once a week.    . Cetirizine HCl (ZYRTEC ALLERGY PO) Take by mouth daily.    [name] dexamethasone (DECADRON) 4 MG tablet 5 tablet by mouth every week. Start with the first dose of chemotherapy. (Patient not taking: Reported on 11/22/2016) 40 tablet 3  . DiphenhydrAMINE HCl (BENADRYL ALLERGY PO) Take by mouth as needed.    [name] EPIPEN 2-PAK 0.3 MG/0.3ML SOAJ injection Reported on 06/21/2015  1  . Fexofenadine HCl (ALLEGRA ALLERGY PO) Take by mouth daily.    . Fluticasone-Salmeterol (ADVAIR) 100-50 MCG/DOSE AEPB Inhale 2 puffs into the lungs every 12 (twelve) hours.    [name] ibuprofen (ADVIL,MOTRIN) 100 MG tablet Take 100 mg by mouth every 6 (six) hours as needed. Reported on 05/31/2015    . insulin glargine (LANTUS) 100 UNIT/ML injection Inject 8 Units into the skin 2 (two) times a week. Reported on 02/18/2015    . levothyroxine (SYNTHROID, LEVOTHROID) 150 MCG tablet Take 150 mcg by mouth daily before breakfast.    . lisinopril (PRINIVIL,ZESTRIL) 10 MG tablet  Take 10 mg by mouth daily.  3  . loperamide (IMODIUM) 2 MG capsule Take 2 mg by mouth as needed for diarrhea or loose stools.    . meloxicam (MOBIC) 15 MG tablet Take 15 mg daily by mouth.  3  . metFORMIN (GLUCOPHAGE) 1000 MG tablet Take 1,000 mg by mouth 2 (two) times daily.  3  . NOVOLOG FLEXPEN 100 UNIT/ML FlexPen 3 Units daily as needed. Reported on 01/25/2015  6  . omeprazole (PRILOSEC) 40 MG capsule Take 40 mg by mouth 2 (two) times daily.     . ondansetron (ZOFRAN-ODT) 8 MG disintegrating tablet Take 1 tablet (8 mg total) by mouth every 8 (eight) hours as needed. Reported on 05/31/2015 (Patient not taking: Reported on 11/22/2016) 20 tablet 2  . ONE TOUCH ULTRA TEST test strip See admin instructions. Reported on 01/25/2015  11  . potassium chloride SA (K-DUR,KLOR-CON) 20 MEQ tablet Take 1 tablet (20 mEq total) by mouth daily. 7 tablet 0  . pravastatin (PRAVACHOL) 40 MG tablet Take 40 mg by mouth every evening.     [name] PROAIR HFA 108 (90 Base) MCG/ACT inhaler Reported on 06/21/2015  1  . REVLIMID 25 MG capsule TAKE 1 CAPSULE (25MG) BY MOUTH ONCE DAILY 21 DAYS ON AND 7 DAYS OFF.08/27/16  auth number [id number]. Adult female not of childbearing potential. (Patient not taking: Reported on 11/22/2016) 21 capsule 0  . sertraline (ZOLOFT) 50 MG tablet TAKE 3 TABLETS BY MOUTH EVERY DAY 270 tablet 3  . verapamil (CALAN-SR) 240 MG CR tablet Take 240 mg by mouth 2 (two) times daily.    [name] warfarin (COUMADIN) 2 MG tablet Take 1 tablet (2 mg total) daily by mouth. (Patient not taking: Reported on 11/22/2016) 90 tablet 0   No current facility-administered medications for this visit.     SURGICAL HISTORY:  Past Surgical History:  Procedure Laterality Date  . ABLATION  09/2007   HTA and polyp resection  . BACK SURGERY  03/2004   herniation, L4-L5  . Bil Laprascopic knee surgery    . BONE MARROW BIOPSY     2011  . BONE MARROW BIOPSY  4/14  . CHOLECYSTECTOMY    . FOOT SURGERY  1998  . KNEE ARTHROSCOPY W/  MENISCAL REPAIR  10/10 ; 3/11  . LAPAROSCOPIC CHOLECYSTECTOMY  1997  . NASAL SINUS SURGERY  2004  . UTERINE FIBROID EMBOLIZATION      REVIEW OF SYSTEMS:  Constitutional: negative Eyes: negative Ears, nose, mouth, throat, and face: negative Respiratory: negative Cardiovascular: negative Gastrointestinal: negative Genitourinary:negative Integument/breast: negative Hematologic/lymphatic: negative Musculoskeletal:positive for arthralgias Neurological: negative Behavioral/Psych: negative Endocrine: negative Allergic/Immunologic: negative   PHYSICAL EXAMINATION: General appearance: alert, cooperative and no distress Head: Normocephalic, without obvious abnormality, atraumatic Neck: no adenopathy Lymph nodes: Cervical, supraclavicular, and axillary nodes normal. Resp: clear to auscultation bilaterally Back: symmetric, no curvature. ROM normal. No CVA  tenderness. Cardio: regular rate and rhythm, S1, S2 normal, no murmur, click, rub or gallop GI: soft, non-tender; bowel sounds normal; no masses,  no organomegaly Extremities: extremities normal, atraumatic, no cyanosis or edema Neurologic: Alert and oriented X 3, normal strength and tone. Normal symmetric reflexes. Normal coordination and gait  ECOG PERFORMANCE STATUS: 1 - Symptomatic but completely ambulatory  Blood pressure (!) 157/68, pulse 64, temperature 98 F (36.7 C), temperature source Oral, resp. rate 18, height 5' 6.75" (1.695 m), weight 262 lb 14.4 oz (119.3 kg), SpO2 100 %.  LABORATORY DATA: Lab Results  Component Value Date   WBC 4.5 01/21/2017   HGB 10.0 (L) 01/21/2017   HCT 30.4 (L) 01/21/2017   MCV 84.6 01/21/2017   PLT 161 01/21/2017      Chemistry      Component Value Date/Time   NA 140 01/21/2017 0815   NA 138 11/14/2016 0824   K 4.4 01/21/2017 0815   K 4.3 11/14/2016 0824   CL 105 01/21/2017 0815   CL 104 04/07/2012 1415   CO2 23 01/21/2017 0815   CO2 23 11/14/2016 0824   BUN 10 01/21/2017 0815    BUN 9.1 11/14/2016 0824   CREATININE 0.77 01/21/2017 0815   CREATININE 0.7 11/14/2016 0824      Component Value Date/Time   CALCIUM 9.6 01/21/2017 0815   CALCIUM 9.6 11/14/2016 0824   ALKPHOS 123 01/21/2017 0815   ALKPHOS 94 11/14/2016 0824   AST 29 01/21/2017 0815   AST 28 11/14/2016 0824   ALT 33 01/21/2017 0815   ALT 24 11/14/2016 0824   BILITOT 0.4 01/21/2017 0815   BILITOT 0.41 11/14/2016 0824      ASSESSMENT AND PLAN:  This is a very pleasant 57 years old white female with multiple myeloma status post several chemotherapy regimens. The patient has been on treatment with subcutaneous weekly Velcade as well as Revlimid and DecadronStatus post 7 cycles. She has been tolerating her treatment fairly well except for the increasing fatigue. The patient has been in observation for the last few months.  She is feeling fine today except for the arthralgia and carpal tunnel. Her recent myeloma panel showed further increase in the free lambda light chain. I discussed the lab results with the patient and give her a copy of her report.  I recommended for her to resume her previous treatment with Velcade, Revlimid and Decadron. She is expected to start cycle #8 next week. I will send prescription for Decadron, acyclovir, Coumadin to her pharmacy. I will arrange for the patient to come back for follow-up visit in 4 weeks for reevaluation before starting the next cycle of her treatment. For hypertension, the patient was advised to monitor her blood pressure closely at home and to report to her primary care physician if no improvement. The patient was advised to call immediately if she has any concerning symptoms in the interval. All questions were answered. The patient knows to call the clinic with any problems, questions or concerns. We can certainly see the patient much sooner if necessary. I spent 15 minutes counseling the patient face to face. The total time spent in the appointment was 25  minutes. Disclaimer: This note was dictated with voice recognition software. Similar sounding words can inadvertently be transcribed and may not be corrected upon review.

## 2017-01-28 NOTE — Telephone Encounter (Signed)
Patients orders were not in she requested Tuesdays and early morning

## 2017-01-30 ENCOUNTER — Telehealth: Payer: Self-pay | Admitting: Internal Medicine

## 2017-01-30 ENCOUNTER — Encounter: Payer: Self-pay | Admitting: Internal Medicine

## 2017-01-30 NOTE — Telephone Encounter (Signed)
Scheduled appt per 1/21 los - Patient is aware of apt date and time

## 2017-02-04 DIAGNOSIS — E7800 Pure hypercholesterolemia, unspecified: Secondary | ICD-10-CM | POA: Diagnosis not present

## 2017-02-04 DIAGNOSIS — E039 Hypothyroidism, unspecified: Secondary | ICD-10-CM | POA: Diagnosis not present

## 2017-02-04 DIAGNOSIS — E1165 Type 2 diabetes mellitus with hyperglycemia: Secondary | ICD-10-CM | POA: Diagnosis not present

## 2017-02-04 DIAGNOSIS — G609 Hereditary and idiopathic neuropathy, unspecified: Secondary | ICD-10-CM | POA: Diagnosis not present

## 2017-02-05 ENCOUNTER — Inpatient Hospital Stay: Payer: BLUE CROSS/BLUE SHIELD

## 2017-02-05 VITALS — BP 160/71 | HR 65 | Temp 97.8°F | Resp 18 | Wt 263.8 lb

## 2017-02-05 DIAGNOSIS — C9000 Multiple myeloma not having achieved remission: Secondary | ICD-10-CM | POA: Diagnosis not present

## 2017-02-05 DIAGNOSIS — R530 Neoplastic (malignant) related fatigue: Secondary | ICD-10-CM | POA: Diagnosis not present

## 2017-02-05 DIAGNOSIS — Z5112 Encounter for antineoplastic immunotherapy: Secondary | ICD-10-CM | POA: Diagnosis not present

## 2017-02-05 DIAGNOSIS — Z9221 Personal history of antineoplastic chemotherapy: Secondary | ICD-10-CM | POA: Diagnosis not present

## 2017-02-05 DIAGNOSIS — I10 Essential (primary) hypertension: Secondary | ICD-10-CM | POA: Diagnosis not present

## 2017-02-05 LAB — CBC WITH DIFFERENTIAL/PLATELET
Basophils Absolute: 0 10*3/uL (ref 0.0–0.1)
Basophils Relative: 1 %
EOS ABS: 0.1 10*3/uL (ref 0.0–0.5)
Eosinophils Relative: 3 %
HEMATOCRIT: 31.6 % — AB (ref 34.8–46.6)
HEMOGLOBIN: 10.3 g/dL — AB (ref 11.6–15.9)
LYMPHS PCT: 23 %
Lymphs Abs: 1 10*3/uL (ref 0.9–3.3)
MCH: 27.7 pg (ref 25.1–34.0)
MCHC: 32.7 g/dL (ref 31.5–36.0)
MCV: 84.7 fL (ref 79.5–101.0)
MONOS PCT: 7 %
Monocytes Absolute: 0.3 10*3/uL (ref 0.1–0.9)
NEUTROS ABS: 3 10*3/uL (ref 1.5–6.5)
NEUTROS PCT: 66 %
Platelets: 164 10*3/uL (ref 145–400)
RBC: 3.73 MIL/uL (ref 3.70–5.45)
RDW: 15.2 % (ref 11.2–16.1)
WBC: 4.5 10*3/uL (ref 3.9–10.3)

## 2017-02-05 LAB — COMPREHENSIVE METABOLIC PANEL
ALT: 37 U/L (ref 0–55)
ANION GAP: 10 (ref 3–11)
AST: 33 U/L (ref 5–34)
Albumin: 3.9 g/dL (ref 3.5–5.0)
Alkaline Phosphatase: 113 U/L (ref 40–150)
BUN: 9 mg/dL (ref 7–26)
CO2: 23 mmol/L (ref 22–29)
CREATININE: 0.76 mg/dL (ref 0.60–1.10)
Calcium: 9.7 mg/dL (ref 8.4–10.4)
Chloride: 105 mmol/L (ref 98–109)
GFR calc non Af Amer: >60 mL/min (ref >60)
Glucose, Bld: 123 mg/dL (ref 70–140)
POTASSIUM: 4.2 mmol/L (ref 3.3–4.7)
SODIUM: 138 mmol/L (ref 136–145)
Total Bilirubin: 0.4 mg/dL (ref 0.2–1.2)
Total Protein: 7.2 g/dL (ref 6.4–8.3)

## 2017-02-05 MED ORDER — ONDANSETRON HCL 8 MG PO TABS
8.0000 mg | ORAL_TABLET | Freq: Once | ORAL | Status: AC
  Administered 2017-02-05: 8 mg via ORAL

## 2017-02-05 MED ORDER — BORTEZOMIB CHEMO SQ INJECTION 3.5 MG (2.5MG/ML)
1.3000 mg/m2 | Freq: Once | INTRAMUSCULAR | Status: AC
  Administered 2017-02-05: 3 mg via SUBCUTANEOUS
  Filled 2017-02-05: qty 3

## 2017-02-05 NOTE — Patient Instructions (Signed)
[facility] Discharge Instructions for Patients Receiving Chemotherapy  Today you received the following chemotherapy agents Velcade.  To help prevent nausea and vomiting after your treatment, we encourage you to take your nausea medication as directed.  If you develop nausea and vomiting that is not controlled by your nausea medication, call the clinic.   BELOW ARE SYMPTOMS THAT SHOULD BE REPORTED IMMEDIATELY:  *FEVER GREATER THAN 100.5 F  *CHILLS WITH OR WITHOUT FEVER  NAUSEA AND VOMITING THAT IS NOT CONTROLLED WITH YOUR NAUSEA MEDICATION  *UNUSUAL SHORTNESS OF BREATH  *UNUSUAL BRUISING OR BLEEDING  TENDERNESS IN MOUTH AND THROAT WITH OR WITHOUT PRESENCE OF ULCERS  *URINARY PROBLEMS  *BOWEL PROBLEMS  UNUSUAL RASH Items with * indicate a potential emergency and should be followed up as soon as possible.  Feel free to call the clinic should you have any questions or concerns. The clinic phone number is [phone].  Please show the CHEMO ALERT CARD at check-in to the Emergency Department and triage nurse.   

## 2017-02-11 ENCOUNTER — Ambulatory Visit: Payer: BLUE CROSS/BLUE SHIELD

## 2017-02-11 ENCOUNTER — Other Ambulatory Visit: Payer: BLUE CROSS/BLUE SHIELD

## 2017-02-12 ENCOUNTER — Encounter: Payer: Self-pay | Admitting: Internal Medicine

## 2017-02-12 ENCOUNTER — Inpatient Hospital Stay: Payer: BLUE CROSS/BLUE SHIELD

## 2017-02-12 ENCOUNTER — Inpatient Hospital Stay: Payer: BLUE CROSS/BLUE SHIELD | Attending: Internal Medicine

## 2017-02-12 VITALS — BP 137/75 | HR 70 | Temp 97.8°F | Resp 18 | Wt 264.0 lb

## 2017-02-12 DIAGNOSIS — C9000 Multiple myeloma not having achieved remission: Secondary | ICD-10-CM

## 2017-02-12 DIAGNOSIS — I10 Essential (primary) hypertension: Secondary | ICD-10-CM | POA: Insufficient documentation

## 2017-02-12 DIAGNOSIS — Z5112 Encounter for antineoplastic immunotherapy: Secondary | ICD-10-CM | POA: Diagnosis not present

## 2017-02-12 DIAGNOSIS — R530 Neoplastic (malignant) related fatigue: Secondary | ICD-10-CM | POA: Diagnosis not present

## 2017-02-12 LAB — COMPREHENSIVE METABOLIC PANEL
ALT: 76 U/L — AB (ref 0–55)
AST: 59 U/L — ABNORMAL HIGH (ref 5–34)
Albumin: 3.8 g/dL (ref 3.5–5.0)
Alkaline Phosphatase: 127 U/L (ref 40–150)
Anion gap: 13 — ABNORMAL HIGH (ref 3–11)
BUN: 8 mg/dL (ref 7–26)
CHLORIDE: 99 mmol/L (ref 98–109)
CO2: 24 mmol/L (ref 22–29)
CREATININE: 0.77 mg/dL (ref 0.60–1.10)
Calcium: 9.4 mg/dL (ref 8.4–10.4)
Glucose, Bld: 119 mg/dL (ref 70–140)
Potassium: 4.3 mmol/L (ref 3.5–5.1)
Sodium: 136 mmol/L (ref 136–145)
TOTAL PROTEIN: 7 g/dL (ref 6.4–8.3)
Total Bilirubin: 0.4 mg/dL (ref 0.2–1.2)

## 2017-02-12 LAB — CBC WITH DIFFERENTIAL/PLATELET
BASOS ABS: 0 10*3/uL (ref 0.0–0.1)
BASOS PCT: 1 %
EOS ABS: 0.2 10*3/uL (ref 0.0–0.5)
EOS PCT: 4 %
HCT: 32 % — ABNORMAL LOW (ref 34.8–46.6)
HEMOGLOBIN: 10.6 g/dL — AB (ref 11.6–15.9)
LYMPHS ABS: 0.8 10*3/uL — AB (ref 0.9–3.3)
Lymphocytes Relative: 17 %
MCH: 28.1 pg (ref 25.1–34.0)
MCHC: 33.2 g/dL (ref 31.5–36.0)
MCV: 84.6 fL (ref 79.5–101.0)
Monocytes Absolute: 0.3 10*3/uL (ref 0.1–0.9)
Monocytes Relative: 8 %
Neutro Abs: 3.2 10*3/uL (ref 1.5–6.5)
Neutrophils Relative %: 70 %
PLATELETS: 149 10*3/uL (ref 145–400)
RBC: 3.79 MIL/uL (ref 3.70–5.45)
RDW: 15.5 % — ABNORMAL HIGH (ref 11.2–14.5)
WBC: 4.5 10*3/uL (ref 3.9–10.3)

## 2017-02-12 MED ORDER — BORTEZOMIB CHEMO SQ INJECTION 3.5 MG (2.5MG/ML)
1.3000 mg/m2 | Freq: Once | INTRAMUSCULAR | Status: AC
  Administered 2017-02-12: 3 mg via SUBCUTANEOUS
  Filled 2017-02-12: qty 3

## 2017-02-12 MED ORDER — ONDANSETRON HCL 8 MG PO TABS
ORAL_TABLET | ORAL | Status: AC
  Filled 2017-02-12: qty 1

## 2017-02-12 MED ORDER — ONDANSETRON HCL 8 MG PO TABS
8.0000 mg | ORAL_TABLET | Freq: Once | ORAL | Status: AC
  Administered 2017-02-12: 8 mg via ORAL

## 2017-02-12 NOTE — Patient Instructions (Signed)
[facility] Discharge Instructions for Patients Receiving Chemotherapy  Today you received the following chemotherapy agents Velcade.  To help prevent nausea and vomiting after your treatment, we encourage you to take your nausea medication as directed.  If you develop nausea and vomiting that is not controlled by your nausea medication, call the clinic.   BELOW ARE SYMPTOMS THAT SHOULD BE REPORTED IMMEDIATELY:  *FEVER GREATER THAN 100.5 F  *CHILLS WITH OR WITHOUT FEVER  NAUSEA AND VOMITING THAT IS NOT CONTROLLED WITH YOUR NAUSEA MEDICATION  *UNUSUAL SHORTNESS OF BREATH  *UNUSUAL BRUISING OR BLEEDING  TENDERNESS IN MOUTH AND THROAT WITH OR WITHOUT PRESENCE OF ULCERS  *URINARY PROBLEMS  *BOWEL PROBLEMS  UNUSUAL RASH Items with * indicate a potential emergency and should be followed up as soon as possible.  Feel free to call the clinic should you have any questions or concerns. The clinic phone number is [phone].  Please show the CHEMO ALERT CARD at check-in to the Emergency Department and triage nurse.   

## 2017-02-18 ENCOUNTER — Ambulatory Visit: Payer: BLUE CROSS/BLUE SHIELD

## 2017-02-18 ENCOUNTER — Other Ambulatory Visit: Payer: Self-pay | Admitting: Internal Medicine

## 2017-02-18 ENCOUNTER — Other Ambulatory Visit: Payer: BLUE CROSS/BLUE SHIELD

## 2017-02-18 DIAGNOSIS — C9000 Multiple myeloma not having achieved remission: Secondary | ICD-10-CM

## 2017-02-19 ENCOUNTER — Encounter: Payer: Self-pay | Admitting: Medical Oncology

## 2017-02-19 ENCOUNTER — Inpatient Hospital Stay: Payer: BLUE CROSS/BLUE SHIELD

## 2017-02-19 ENCOUNTER — Other Ambulatory Visit: Payer: Self-pay | Admitting: *Deleted

## 2017-02-19 ENCOUNTER — Other Ambulatory Visit: Payer: Self-pay | Admitting: Medical Oncology

## 2017-02-19 VITALS — BP 133/54 | HR 76 | Temp 98.0°F | Resp 18

## 2017-02-19 DIAGNOSIS — C9000 Multiple myeloma not having achieved remission: Secondary | ICD-10-CM | POA: Diagnosis not present

## 2017-02-19 DIAGNOSIS — I10 Essential (primary) hypertension: Secondary | ICD-10-CM | POA: Diagnosis not present

## 2017-02-19 DIAGNOSIS — Z5112 Encounter for antineoplastic immunotherapy: Secondary | ICD-10-CM | POA: Diagnosis not present

## 2017-02-19 DIAGNOSIS — R530 Neoplastic (malignant) related fatigue: Secondary | ICD-10-CM | POA: Diagnosis not present

## 2017-02-19 LAB — CMP (CANCER CENTER ONLY)
ALBUMIN: 3.6 g/dL (ref 3.5–5.0)
ALK PHOS: 118 U/L (ref 40–150)
ALT: 58 U/L — AB (ref 0–55)
AST: 42 U/L — AB (ref 5–34)
Anion gap: 13 — ABNORMAL HIGH (ref 3–11)
BILIRUBIN TOTAL: 0.5 mg/dL (ref 0.2–1.2)
BUN: 7 mg/dL (ref 7–26)
CALCIUM: 9.1 mg/dL (ref 8.4–10.4)
CO2: 23 mmol/L (ref 22–29)
CREATININE: 0.79 mg/dL (ref 0.60–1.10)
Chloride: 102 mmol/L (ref 98–109)
GFR, Est AFR Am: >60 mL/min (ref >60)
Glucose, Bld: 145 mg/dL — ABNORMAL HIGH (ref 70–140)
Potassium: 3.5 mmol/L (ref 3.5–5.1)
Sodium: 138 mmol/L (ref 136–145)
TOTAL PROTEIN: 6.7 g/dL (ref 6.4–8.3)

## 2017-02-19 LAB — CBC WITH DIFFERENTIAL (CANCER CENTER ONLY)
BASOS PCT: 1 %
Basophils Absolute: 0 10*3/uL (ref 0.0–0.1)
EOS ABS: 0.1 10*3/uL (ref 0.0–0.5)
EOS PCT: 3 %
HCT: 30.5 % — ABNORMAL LOW (ref 34.8–46.6)
Hemoglobin: 10 g/dL — ABNORMAL LOW (ref 11.6–15.9)
LYMPHS PCT: 15 %
Lymphs Abs: 0.6 10*3/uL — ABNORMAL LOW (ref 0.9–3.3)
MCH: 27.7 pg (ref 25.1–34.0)
MCHC: 32.6 g/dL (ref 31.5–36.0)
MCV: 84.9 fL (ref 79.5–101.0)
MONO ABS: 0.4 10*3/uL (ref 0.1–0.9)
Monocytes Relative: 9 %
Neutro Abs: 3.2 10*3/uL (ref 1.5–6.5)
Neutrophils Relative %: 72 %
PLATELETS: 127 10*3/uL — AB (ref 145–400)
RBC: 3.6 MIL/uL — ABNORMAL LOW (ref 3.70–5.45)
RDW: 15.6 % — AB (ref 11.2–14.5)
WBC Count: 4.4 10*3/uL (ref 3.9–10.3)

## 2017-02-19 MED ORDER — REVLIMID 25 MG PO CAPS: ORAL_CAPSULE | ORAL | 0 refills | Status: DC

## 2017-02-19 MED ORDER — BORTEZOMIB CHEMO SQ INJECTION 3.5 MG (2.5MG/ML)
1.3000 mg/m2 | Freq: Once | INTRAMUSCULAR | Status: AC
  Administered 2017-02-19: 3 mg via SUBCUTANEOUS
  Filled 2017-02-19: qty 3

## 2017-02-19 MED ORDER — ONDANSETRON HCL 8 MG PO TABS
ORAL_TABLET | ORAL | Status: AC
  Filled 2017-02-19: qty 1

## 2017-02-19 MED ORDER — ONDANSETRON HCL 8 MG PO TABS
8.0000 mg | ORAL_TABLET | Freq: Once | ORAL | Status: AC
  Administered 2017-02-19: 8 mg via ORAL

## 2017-02-19 NOTE — Patient Instructions (Signed)
[facility] Discharge Instructions for Patients Receiving Chemotherapy  Today you received the following chemotherapy agent:  Velcade.  To help prevent nausea and vomiting after your treatment, we encourage you to take your nausea medication as directed.   If you develop nausea and vomiting that is not controlled by your nausea medication, call the clinic.   BELOW ARE SYMPTOMS THAT SHOULD BE REPORTED IMMEDIATELY:  *FEVER GREATER THAN 100.5 F  *CHILLS WITH OR WITHOUT FEVER  NAUSEA AND VOMITING THAT IS NOT CONTROLLED WITH YOUR NAUSEA MEDICATION  *UNUSUAL SHORTNESS OF BREATH  *UNUSUAL BRUISING OR BLEEDING  TENDERNESS IN MOUTH AND THROAT WITH OR WITHOUT PRESENCE OF ULCERS  *URINARY PROBLEMS  *BOWEL PROBLEMS  UNUSUAL RASH Items with * indicate a potential emergency and should be followed up as soon as possible.  Feel free to call the clinic should you have any questions or concerns. The clinic phone number is [phone].  Please show the CHEMO ALERT CARD at check-in to the Emergency Department and triage nurse.   

## 2017-02-25 ENCOUNTER — Other Ambulatory Visit: Payer: BLUE CROSS/BLUE SHIELD

## 2017-02-25 ENCOUNTER — Other Ambulatory Visit: Payer: Self-pay | Admitting: *Deleted

## 2017-02-25 ENCOUNTER — Ambulatory Visit: Payer: BLUE CROSS/BLUE SHIELD | Admitting: Oncology

## 2017-02-25 ENCOUNTER — Ambulatory Visit: Payer: BLUE CROSS/BLUE SHIELD

## 2017-02-25 DIAGNOSIS — C9000 Multiple myeloma not having achieved remission: Secondary | ICD-10-CM

## 2017-02-26 ENCOUNTER — Inpatient Hospital Stay: Payer: BLUE CROSS/BLUE SHIELD

## 2017-02-26 ENCOUNTER — Other Ambulatory Visit: Payer: Self-pay

## 2017-02-26 ENCOUNTER — Encounter: Payer: Self-pay | Admitting: Oncology

## 2017-02-26 ENCOUNTER — Inpatient Hospital Stay: Payer: BLUE CROSS/BLUE SHIELD | Admitting: Oncology

## 2017-02-26 VITALS — BP 156/61 | HR 65 | Temp 97.8°F | Resp 18 | Ht 66.75 in | Wt 264.4 lb

## 2017-02-26 DIAGNOSIS — I10 Essential (primary) hypertension: Secondary | ICD-10-CM | POA: Diagnosis not present

## 2017-02-26 DIAGNOSIS — Z5111 Encounter for antineoplastic chemotherapy: Secondary | ICD-10-CM

## 2017-02-26 DIAGNOSIS — R239 Unspecified skin changes: Secondary | ICD-10-CM

## 2017-02-26 DIAGNOSIS — C9000 Multiple myeloma not having achieved remission: Secondary | ICD-10-CM

## 2017-02-26 DIAGNOSIS — R530 Neoplastic (malignant) related fatigue: Secondary | ICD-10-CM | POA: Diagnosis not present

## 2017-02-26 DIAGNOSIS — E8809 Other disorders of plasma-protein metabolism, not elsewhere classified: Secondary | ICD-10-CM

## 2017-02-26 DIAGNOSIS — Z5112 Encounter for antineoplastic immunotherapy: Secondary | ICD-10-CM | POA: Diagnosis not present

## 2017-02-26 LAB — CBC WITH DIFFERENTIAL (CANCER CENTER ONLY)
Basophils Absolute: 0 10*3/uL (ref 0.0–0.1)
Basophils Relative: 1 %
EOS ABS: 0.2 10*3/uL (ref 0.0–0.5)
Eosinophils Relative: 4 %
HCT: 30 % — ABNORMAL LOW (ref 34.8–46.6)
Hemoglobin: 9.7 g/dL — ABNORMAL LOW (ref 11.6–15.9)
LYMPHS ABS: 0.7 10*3/uL — AB (ref 0.9–3.3)
LYMPHS PCT: 18 %
MCH: 27.2 pg (ref 25.1–34.0)
MCHC: 32.3 g/dL (ref 31.5–36.0)
MCV: 84.3 fL (ref 79.5–101.0)
MONOS PCT: 10 %
Monocytes Absolute: 0.4 10*3/uL (ref 0.1–0.9)
Neutro Abs: 2.4 10*3/uL (ref 1.5–6.5)
Neutrophils Relative %: 67 %
Platelet Count: 132 10*3/uL — ABNORMAL LOW (ref 145–400)
RBC: 3.56 MIL/uL — AB (ref 3.70–5.45)
RDW: 15.9 % — ABNORMAL HIGH (ref 11.2–14.5)
WBC: 3.7 10*3/uL — AB (ref 3.9–10.3)

## 2017-02-26 LAB — CMP (CANCER CENTER ONLY)
ALT: 63 U/L — AB (ref 0–55)
ANION GAP: 12 — AB (ref 3–11)
AST: 51 U/L — ABNORMAL HIGH (ref 5–34)
Albumin: 3.5 g/dL (ref 3.5–5.0)
Alkaline Phosphatase: 134 U/L (ref 40–150)
BUN: 8 mg/dL (ref 7–26)
CALCIUM: 9.3 mg/dL (ref 8.4–10.4)
CO2: 23 mmol/L (ref 22–29)
CREATININE: 0.76 mg/dL (ref 0.60–1.10)
Chloride: 104 mmol/L (ref 98–109)
Glucose, Bld: 127 mg/dL (ref 70–140)
Potassium: 3.9 mmol/L (ref 3.5–5.1)
SODIUM: 139 mmol/L (ref 136–145)
Total Bilirubin: 0.5 mg/dL (ref 0.2–1.2)
Total Protein: 6.7 g/dL (ref 6.4–8.3)

## 2017-02-26 MED ORDER — ONDANSETRON HCL 8 MG PO TABS
8.0000 mg | ORAL_TABLET | Freq: Once | ORAL | Status: AC
  Administered 2017-02-26: 8 mg via ORAL

## 2017-02-26 MED ORDER — BORTEZOMIB CHEMO SQ INJECTION 3.5 MG (2.5MG/ML)
1.3000 mg/m2 | Freq: Once | INTRAMUSCULAR | Status: AC
  Administered 2017-02-26: 3 mg via SUBCUTANEOUS
  Filled 2017-02-26: qty 3

## 2017-02-26 MED ORDER — ONDANSETRON HCL 8 MG PO TABS
ORAL_TABLET | ORAL | Status: AC
  Filled 2017-02-26: qty 1

## 2017-02-26 NOTE — Patient Instructions (Signed)
[facility] Discharge Instructions for Patients Receiving Chemotherapy  Today you received the following chemotherapy agents Velcade.  To help prevent nausea and vomiting after your treatment, we encourage you to take your nausea medication as directed.  If you develop nausea and vomiting that is not controlled by your nausea medication, call the clinic.   BELOW ARE SYMPTOMS THAT SHOULD BE REPORTED IMMEDIATELY:  *FEVER GREATER THAN 100.5 F  *CHILLS WITH OR WITHOUT FEVER  NAUSEA AND VOMITING THAT IS NOT CONTROLLED WITH YOUR NAUSEA MEDICATION  *UNUSUAL SHORTNESS OF BREATH  *UNUSUAL BRUISING OR BLEEDING  TENDERNESS IN MOUTH AND THROAT WITH OR WITHOUT PRESENCE OF ULCERS  *URINARY PROBLEMS  *BOWEL PROBLEMS  UNUSUAL RASH Items with * indicate a potential emergency and should be followed up as soon as possible.  Feel free to call the clinic should you have any questions or concerns. The clinic phone number is [phone].  Please show the CHEMO ALERT CARD at check-in to the Emergency Department and triage nurse.   

## 2017-02-26 NOTE — Progress Notes (Signed)
[facility] OFFICE PROGRESS NOTE  Keever, Nanine Means, MD [place]. [place] [address]  DIAGNOSIS: Plasma cell dyscrasia initially diagnosed as MGUS in September 2010, with additional symptoms suggestive of POEMS syndrome.   PRIOR THERAPY: 1) Velcade 1.3 MG/M2 subcutaneously with Decadron 40 mg by mouth on a weekly basis. First cycle 11/24/2013. She status post 31 weekly doses of treatment. 2) Velcade 1.3 MG/M2 subcutaneously and weekly basis with Decadron 40 mg by mouth weekly. First dose 02/01/2015. Status post 28 cycles. 3) Revlimid 25 mg by mouth daily for 21 days every 4 weeks with weekly Decadron 20 mg. started in 11/27/2015. Status post 3 cycles discontinued secondary to lack of response.  CURRENT THERAPY: Systemic treatment with Velcade 1.3 MG/KG weekly, Revlimid 25 mg by mouth daily for 21 days every 4 weeks in addition to Decadron 20 mg by mouth weekly. First dose 03/06/2016. Status post 7 cycles. Cycle #7 started on 07/09/2016.  She was placed on observation for approximately 5 months and then treatment was resumed on 02/05/2017.  Status post 8 total cycles.  INTERVAL HISTORY: Brenda Conner 57 y.o. female returns for routine follow-up visit by herself.  The patient is feeling fine today has no specific complaints except for mild fatigue.  She denies fevers and chills.  Denies chest pain, shortness of breath, cough, hemoptysis.  Denies nausea, vomiting, constipation, diarrhea.  She is overall been tolerating her chemotherapy well.  The patient is here for evaluation prior to starting her next cycle of treatment.  MEDICAL HISTORY: Past Medical History:  Diagnosis Date  . Acid reflux   . Anxiety   . Asthma   . Cancer [facility])    waldenstroms/ macroglobinulemia  . Depression   . Depression   . Diabetes mellitus without complication ([place])   . Dysuria 02/28/2016  . Hypercholesteremia   . Hypertension   . Hypothyroidism   . Macroglobulinemia ([place])    ? POEMS  syndrome  . Multiple myeloma ([facility])   . Obesity   . PONV (postoperative nausea and vomiting)   . Sleep apnea    CPAP at bedtime  . Sleep apnea     ALLERGIES:  is allergic to codeine; hydrocodone; lortab [hydrocodone-acetaminophen]; onion; shellfish allergy; and amoxicillin.  MEDICATIONS:  Current Outpatient Medications  Medication Sig Dispense Refill  . acyclovir (ZOVIRAX) 400 MG tablet Take 1 tablet (400 mg total) by mouth 2 (two) times daily. 180 tablet 0  . ALPRAZolam (XANAX) 0.5 MG tablet Take 1 tablet (0.5 mg total) by mouth at bedtime as needed for anxiety. 30 tablet 0  . Azelastine HCl 0.15 % SOLN as needed.    . B-D UF III MINI PEN NEEDLES 31G X 5 MM MISC     . Blood Glucose Monitoring Suppl (ONE TOUCH ULTRA MINI) W/DEVICE KIT See admin instructions. Reported on 01/25/2015  0  . Bortezomib (VELCADE IJ) Inject as directed once a week.    . Cetirizine HCl (ZYRTEC ALLERGY PO) Take by mouth daily.    [name] dexamethasone (DECADRON) 4 MG tablet 5 tablet by mouth every week. Start with the first dose of chemotherapy. 40 tablet 3  . DiphenhydrAMINE HCl (BENADRYL ALLERGY PO) Take by mouth as needed.    [name] Fexofenadine HCl (ALLEGRA ALLERGY PO) Take by mouth daily.    . Fluticasone-Salmeterol (ADVAIR) 100-50 MCG/DOSE AEPB Inhale 2 puffs into the lungs every 12 (twelve) hours.    . insulin glargine (LANTUS) 100 UNIT/ML injection Inject 8 Units into the skin 2 (  two) times a week. Reported on 02/18/2015    . levothyroxine (SYNTHROID, LEVOTHROID) 150 MCG tablet Take 150 mcg by mouth daily before breakfast.    . lisinopril (PRINIVIL,ZESTRIL) 10 MG tablet Take 10 mg by mouth daily.  3  . metFORMIN (GLUCOPHAGE) 1000 MG tablet Take 1,000 mg by mouth 2 (two) times daily.  3  . NOVOLOG FLEXPEN 100 UNIT/ML FlexPen 3 Units daily as needed. Reported on 01/25/2015  6  . omeprazole (PRILOSEC) 40 MG capsule Take 40 mg by mouth 2 (two) times daily.     . ondansetron (ZOFRAN-ODT) 8 MG disintegrating tablet Take 1  tablet (8 mg total) by mouth every 8 (eight) hours as needed. Reported on 05/31/2015 20 tablet 2  . ONE TOUCH ULTRA TEST test strip See admin instructions. Reported on 01/25/2015  11  . pravastatin (PRAVACHOL) 40 MG tablet Take 40 mg by mouth every evening.     [name] REVLIMID 25 MG capsule TAKE 1 CAPSULE BY MOUTH ONCE DAILY 21 DAYS ON AND 7 DAYS OFF 21 capsule 0  . sertraline (ZOLOFT) 50 MG tablet TAKE 3 TABLETS BY MOUTH EVERY DAY 270 tablet 3  . verapamil (CALAN-SR) 240 MG CR tablet Take 240 mg by mouth 2 (two) times daily.    [name] warfarin (COUMADIN) 2 MG tablet Take 1 tablet (2 mg total) by mouth daily. 90 tablet 0  . EPIPEN 2-PAK 0.3 MG/0.3ML SOAJ injection Reported on 06/21/2015  1  . ibuprofen (ADVIL,MOTRIN) 100 MG tablet Take 100 mg by mouth every 6 (six) hours as needed. Reported on 05/31/2015    . loperamide (IMODIUM) 2 MG capsule Take 2 mg by mouth as needed for diarrhea or loose stools.    . meloxicam (MOBIC) 15 MG tablet Take 15 mg daily by mouth.  3  . PROAIR HFA 108 (90 Base) MCG/ACT inhaler Reported on 06/21/2015  1   No current facility-administered medications for this visit.     SURGICAL HISTORY:  Past Surgical History:  Procedure Laterality Date  . ABLATION  09/2007   HTA and polyp resection  . BACK SURGERY  03/2004   herniation, L4-L5  . Bil Laprascopic knee surgery    . BONE MARROW BIOPSY     2011  . BONE MARROW BIOPSY  4/14  . CHOLECYSTECTOMY    . FOOT SURGERY  1998  . KNEE ARTHROSCOPY W/ MENISCAL REPAIR  10/10 ; 3/11  . LAPAROSCOPIC CHOLECYSTECTOMY  1997  . NASAL SINUS SURGERY  2004  . UTERINE FIBROID EMBOLIZATION      REVIEW OF SYSTEMS:   Review of Systems  Constitutional: Negative for appetite change, chills, fever and unexpected weight change. Positive for mild fatigue. HENT:   Negative for mouth sores, nosebleeds, sore throat and trouble swallowing.   Eyes: Negative for eye problems and icterus.  Respiratory: Negative for cough, hemoptysis, shortness of breath and  wheezing.   Cardiovascular: Negative for chest pain and leg swelling.  Gastrointestinal: Negative for abdominal pain, constipation, diarrhea, nausea and vomiting.  Genitourinary: Negative for bladder incontinence, difficulty urinating, dysuria, frequency and hematuria.   Musculoskeletal: Negative for back pain, gait problem, neck pain and neck stiffness.  Skin: Negative for itching and rash.  Neurological: Negative for dizziness, extremity weakness, gait problem, headaches, light-headedness and seizures.  Hematological: Negative for adenopathy. Does not bruise/bleed easily.  Psychiatric/Behavioral: Negative for confusion, depression and sleep disturbance. The patient is not nervous/anxious.     PHYSICAL EXAMINATION:  Blood pressure (!) 156/61, pulse 65, temperature 97.8 F (  36.6 C), temperature source Oral, resp. rate 18, height 5' 6.75" (1.695 m), weight 264 lb 6.4 oz (119.9 kg), SpO2 100 %.  ECOG PERFORMANCE STATUS: 1 - Symptomatic but completely ambulatory  Physical Exam  Constitutional: Oriented to person, place, and time and well-developed, well-nourished, and in no distress. No distress.  HENT:  Head: Normocephalic and atraumatic.  Mouth/Throat: Oropharynx is clear and moist. No oropharyngeal exudate.  Eyes: Conjunctivae are normal. Right eye exhibits no discharge. Left eye exhibits no discharge. No scleral icterus.  Neck: Normal range of motion. Neck supple.  Cardiovascular: Normal rate, regular rhythm, normal heart sounds and intact distal pulses.   Pulmonary/Chest: Effort normal and breath sounds normal. No respiratory distress. No wheezes. No rales.  Abdominal: Soft. Bowel sounds are normal. Exhibits no distension and no mass. There is no tenderness.  Musculoskeletal: Normal range of motion. Exhibits no edema.  Lymphadenopathy:    No cervical adenopathy.  Neurological: Alert and oriented to person, place, and time. Exhibits normal muscle tone. Gait normal. Coordination normal.   Skin: Skin is warm and dry. No rash noted. Not diaphoretic. No erythema. No pallor.  Psychiatric: Mood, memory and judgment normal.  Vitals reviewed.  LABORATORY DATA: Lab Results  Component Value Date   WBC 3.7 (L) 02/26/2017   HGB 10.6 (L) 02/12/2017   HCT 30.0 (L) 02/26/2017   MCV 84.3 02/26/2017   PLT 132 (L) 02/26/2017      Chemistry      Component Value Date/Time   NA 139 02/26/2017 0750   NA 138 11/14/2016 0824   K 3.9 02/26/2017 0750   K 4.3 11/14/2016 0824   CL 104 02/26/2017 0750   CL 104 04/07/2012 1415   CO2 23 02/26/2017 0750   CO2 23 11/14/2016 0824   BUN 8 02/26/2017 0750   BUN 9.1 11/14/2016 0824   CREATININE 0.76 02/26/2017 0750   CREATININE 0.7 11/14/2016 0824      Component Value Date/Time   CALCIUM 9.3 02/26/2017 0750   CALCIUM 9.6 11/14/2016 0824   ALKPHOS 134 02/26/2017 0750   ALKPHOS 94 11/14/2016 0824   AST 51 (H) 02/26/2017 0750   AST 28 11/14/2016 0824   ALT 63 (H) 02/26/2017 0750   ALT 24 11/14/2016 0824   BILITOT 0.5 02/26/2017 0750   BILITOT 0.41 11/14/2016 0824       RADIOGRAPHIC STUDIES:  No results found.   ASSESSMENT/PLAN:  POEMS syndrome This is a very pleasant 57 year old white female with multiple myeloma status post several chemotherapy regimens. The patient has been on treatment with subcutaneous weekly Velcade as well as Revlimid and Decadron. Status post 8 cycles. She has been tolerating her treatment fairly well except for mild fatigue. Recommend for her to continue her treatment with Velcade, Revlimid and Decadron.  She will start cycle #9 today.  I will arrange for the patient to come back for follow-up visit in 4 weeks for reevaluation before starting the next cycle of her treatment.  For hypertension, the patient was advised to monitor her blood pressure closely at home and to report to her primary care physician if no improvement.  The patient was advised to call immediately if she has any concerning symptoms  in the interval. All questions were answered. The patient knows to call the clinic with any problems, questions or concerns. We can certainly see the patient much sooner if necessary.  Orders Placed This Encounter  Procedures  . Protime-INR    Standing Status:   Future  Standing Expiration Date:   02/26/2018    Mikey Bussing, DNP, AGPCNP-BC, AOCNP 02/26/17

## 2017-02-26 NOTE — Assessment & Plan Note (Signed)
This is a very pleasant 57 year old white female with multiple myeloma status post several chemotherapy regimens. The patient has been on treatment with subcutaneous weekly Velcade as well as Revlimid and Decadron. Status post 8 cycles. She has been tolerating her treatment fairly well except for mild fatigue. Recommend for her to continue her treatment with Velcade, Revlimid and Decadron.  She will start cycle #9 today.  I will arrange for the patient to come back for follow-up visit in 4 weeks for reevaluation before starting the next cycle of her treatment.  For hypertension, the patient was advised to monitor her blood pressure closely at home and to report to her primary care physician if no improvement.  The patient was advised to call immediately if she has any concerning symptoms in the interval. All questions were answered. The patient knows to call the clinic with any problems, questions or concerns. We can certainly see the patient much sooner if necessary.

## 2017-02-27 ENCOUNTER — Telehealth: Payer: Self-pay | Admitting: Oncology

## 2017-02-27 NOTE — Telephone Encounter (Signed)
Appts already scheduled per 2/19 los - no additional appts added.

## 2017-03-04 ENCOUNTER — Other Ambulatory Visit: Payer: BLUE CROSS/BLUE SHIELD

## 2017-03-04 ENCOUNTER — Ambulatory Visit: Payer: BLUE CROSS/BLUE SHIELD

## 2017-03-04 DIAGNOSIS — J301 Allergic rhinitis due to pollen: Secondary | ICD-10-CM | POA: Diagnosis not present

## 2017-03-05 ENCOUNTER — Inpatient Hospital Stay: Payer: BLUE CROSS/BLUE SHIELD

## 2017-03-05 ENCOUNTER — Other Ambulatory Visit: Payer: Self-pay | Admitting: Medical Oncology

## 2017-03-05 VITALS — BP 154/55 | HR 63 | Temp 98.0°F | Resp 18

## 2017-03-05 DIAGNOSIS — J3089 Other allergic rhinitis: Secondary | ICD-10-CM | POA: Diagnosis not present

## 2017-03-05 DIAGNOSIS — C9000 Multiple myeloma not having achieved remission: Secondary | ICD-10-CM

## 2017-03-05 DIAGNOSIS — R530 Neoplastic (malignant) related fatigue: Secondary | ICD-10-CM | POA: Diagnosis not present

## 2017-03-05 DIAGNOSIS — I10 Essential (primary) hypertension: Secondary | ICD-10-CM | POA: Diagnosis not present

## 2017-03-05 DIAGNOSIS — J3081 Allergic rhinitis due to animal (cat) (dog) hair and dander: Secondary | ICD-10-CM | POA: Diagnosis not present

## 2017-03-05 DIAGNOSIS — Z5112 Encounter for antineoplastic immunotherapy: Secondary | ICD-10-CM | POA: Diagnosis not present

## 2017-03-05 LAB — CBC WITH DIFFERENTIAL (CANCER CENTER ONLY)
BASOS ABS: 0.1 10*3/uL (ref 0.0–0.1)
BASOS PCT: 2 %
EOS PCT: 2 %
Eosinophils Absolute: 0.1 10*3/uL (ref 0.0–0.5)
HCT: 29.8 % — ABNORMAL LOW (ref 34.8–46.6)
HEMOGLOBIN: 9.3 g/dL — AB (ref 11.6–15.9)
LYMPHS ABS: 0.7 10*3/uL — AB (ref 0.9–3.3)
Lymphocytes Relative: 19 %
MCH: 27.3 pg (ref 25.1–34.0)
MCHC: 31.2 g/dL — ABNORMAL LOW (ref 31.5–36.0)
MCV: 87.4 fL (ref 79.5–101.0)
Monocytes Absolute: 0.4 10*3/uL (ref 0.1–0.9)
Monocytes Relative: 11 %
NEUTROS PCT: 68 %
Neutro Abs: 2.6 10*3/uL (ref 1.5–6.5)
PLATELETS: 131 10*3/uL — AB (ref 145–400)
RBC: 3.41 MIL/uL — AB (ref 3.70–5.45)
RDW: 15.4 % — ABNORMAL HIGH (ref 11.2–14.5)
WBC: 3.8 10*3/uL — AB (ref 3.9–10.3)

## 2017-03-05 LAB — CMP (CANCER CENTER ONLY)
ALT: 54 U/L (ref 0–55)
AST: 76 U/L — ABNORMAL HIGH (ref 5–34)
Albumin: 3.4 g/dL — ABNORMAL LOW (ref 3.5–5.0)
Alkaline Phosphatase: 116 U/L (ref 40–150)
Anion gap: 10 (ref 3–11)
BILIRUBIN TOTAL: 0.6 mg/dL (ref 0.2–1.2)
BUN: 10 mg/dL (ref 7–26)
CALCIUM: 9.2 mg/dL (ref 8.4–10.4)
CHLORIDE: 110 mmol/L — AB (ref 98–109)
CO2: 21 mmol/L — ABNORMAL LOW (ref 22–29)
CREATININE: 0.77 mg/dL (ref 0.60–1.10)
Glucose, Bld: 123 mg/dL (ref 70–140)
Potassium: 3.7 mmol/L (ref 3.5–5.1)
Sodium: 141 mmol/L (ref 136–145)
TOTAL PROTEIN: 6.5 g/dL (ref 6.4–8.3)

## 2017-03-05 LAB — PROTIME-INR
INR: 1.08
Prothrombin Time: 13.9 seconds (ref 11.4–15.2)

## 2017-03-05 MED ORDER — ONDANSETRON HCL 8 MG PO TABS
ORAL_TABLET | ORAL | Status: AC
  Filled 2017-03-05: qty 1

## 2017-03-05 MED ORDER — BORTEZOMIB CHEMO SQ INJECTION 3.5 MG (2.5MG/ML)
1.3000 mg/m2 | Freq: Once | INTRAMUSCULAR | Status: AC
  Administered 2017-03-05: 3 mg via SUBCUTANEOUS
  Filled 2017-03-05: qty 3

## 2017-03-05 MED ORDER — ONDANSETRON HCL 8 MG PO TABS
8.0000 mg | ORAL_TABLET | Freq: Once | ORAL | Status: AC
  Administered 2017-03-05: 8 mg via ORAL

## 2017-03-05 NOTE — Patient Instructions (Signed)
[facility] Discharge Instructions for Patients Receiving Chemotherapy  Today you received the following chemotherapy agent:  Velcade.  To help prevent nausea and vomiting after your treatment, we encourage you to take your nausea medication as directed.   If you develop nausea and vomiting that is not controlled by your nausea medication, call the clinic.   BELOW ARE SYMPTOMS THAT SHOULD BE REPORTED IMMEDIATELY:  *FEVER GREATER THAN 100.5 F  *CHILLS WITH OR WITHOUT FEVER  NAUSEA AND VOMITING THAT IS NOT CONTROLLED WITH YOUR NAUSEA MEDICATION  *UNUSUAL SHORTNESS OF BREATH  *UNUSUAL BRUISING OR BLEEDING  TENDERNESS IN MOUTH AND THROAT WITH OR WITHOUT PRESENCE OF ULCERS  *URINARY PROBLEMS  *BOWEL PROBLEMS  UNUSUAL RASH Items with * indicate a potential emergency and should be followed up as soon as possible.  Feel free to call the clinic should you have any questions or concerns. The clinic phone number is [phone].  Please show the CHEMO ALERT CARD at check-in to the Emergency Department and triage nurse.   

## 2017-03-06 ENCOUNTER — Telehealth: Payer: Self-pay | Admitting: *Deleted

## 2017-03-06 NOTE — Telephone Encounter (Signed)
-----   Message from Maryanna Shape, NP sent at 03/06/2017 10:47 AM EST ----- Please call pt and let her know that INR is normal. She is on a very low dose of Coumadin to prevent blood clots since she is on Revlimid. so no additional routine monitoring of her INR is needed.   Thanks

## 2017-03-06 NOTE — Telephone Encounter (Signed)
Notified patient that her INR is normal. Since on Revlimid she is on a very low dose of Coumadin to prevent blood clots. No additional routine monitoring of INR is needed. Patient verbalized understanding.

## 2017-03-11 ENCOUNTER — Ambulatory Visit: Payer: BLUE CROSS/BLUE SHIELD

## 2017-03-11 ENCOUNTER — Other Ambulatory Visit: Payer: BLUE CROSS/BLUE SHIELD

## 2017-03-12 ENCOUNTER — Other Ambulatory Visit: Payer: Self-pay

## 2017-03-12 ENCOUNTER — Inpatient Hospital Stay: Payer: BLUE CROSS/BLUE SHIELD

## 2017-03-12 ENCOUNTER — Inpatient Hospital Stay: Payer: BLUE CROSS/BLUE SHIELD | Attending: Internal Medicine

## 2017-03-12 VITALS — BP 118/67 | HR 57 | Temp 97.6°F | Resp 17

## 2017-03-12 DIAGNOSIS — D472 Monoclonal gammopathy: Secondary | ICD-10-CM

## 2017-03-12 DIAGNOSIS — C9000 Multiple myeloma not having achieved remission: Secondary | ICD-10-CM

## 2017-03-12 DIAGNOSIS — E119 Type 2 diabetes mellitus without complications: Secondary | ICD-10-CM | POA: Diagnosis not present

## 2017-03-12 DIAGNOSIS — Z7984 Long term (current) use of oral hypoglycemic drugs: Secondary | ICD-10-CM | POA: Insufficient documentation

## 2017-03-12 DIAGNOSIS — Z5112 Encounter for antineoplastic immunotherapy: Secondary | ICD-10-CM | POA: Insufficient documentation

## 2017-03-12 DIAGNOSIS — Z794 Long term (current) use of insulin: Secondary | ICD-10-CM | POA: Insufficient documentation

## 2017-03-12 DIAGNOSIS — Z79899 Other long term (current) drug therapy: Secondary | ICD-10-CM | POA: Insufficient documentation

## 2017-03-12 DIAGNOSIS — Z9221 Personal history of antineoplastic chemotherapy: Secondary | ICD-10-CM | POA: Insufficient documentation

## 2017-03-12 LAB — CMP (CANCER CENTER ONLY)
ALK PHOS: 122 U/L (ref 40–150)
ALT: 44 U/L (ref 0–55)
ANION GAP: 13 — AB (ref 3–11)
AST: 33 U/L (ref 5–34)
Albumin: 3.8 g/dL (ref 3.5–5.0)
BUN: 7 mg/dL (ref 7–26)
CALCIUM: 9.5 mg/dL (ref 8.4–10.4)
CO2: 24 mmol/L (ref 22–29)
Chloride: 103 mmol/L (ref 98–109)
Creatinine: 0.78 mg/dL (ref 0.60–1.10)
GFR, Estimated: >60 mL/min (ref >60)
Glucose, Bld: 123 mg/dL (ref 70–140)
Potassium: 3.9 mmol/L (ref 3.5–5.1)
SODIUM: 140 mmol/L (ref 136–145)
Total Bilirubin: 0.6 mg/dL (ref 0.2–1.2)
Total Protein: 7.1 g/dL (ref 6.4–8.3)

## 2017-03-12 LAB — CBC WITH DIFFERENTIAL (CANCER CENTER ONLY)
BASOS ABS: 0.1 10*3/uL (ref 0.0–0.1)
BASOS PCT: 1 %
EOS ABS: 0.2 10*3/uL (ref 0.0–0.5)
EOS PCT: 5 %
HCT: 32.4 % — ABNORMAL LOW (ref 34.8–46.6)
Hemoglobin: 9.9 g/dL — ABNORMAL LOW (ref 11.6–15.9)
LYMPHS ABS: 0.7 10*3/uL — AB (ref 0.9–3.3)
Lymphocytes Relative: 21 %
MCH: 27.1 pg (ref 25.1–34.0)
MCHC: 30.6 g/dL — ABNORMAL LOW (ref 31.5–36.0)
MCV: 88.8 fL (ref 79.5–101.0)
Monocytes Absolute: 0.2 10*3/uL (ref 0.1–0.9)
Monocytes Relative: 7 %
Neutro Abs: 2.3 10*3/uL (ref 1.5–6.5)
Neutrophils Relative %: 66 %
PLATELETS: 110 10*3/uL — AB (ref 145–400)
RBC: 3.65 MIL/uL — AB (ref 3.70–5.45)
RDW: 15.3 % — ABNORMAL HIGH (ref 11.2–14.5)
WBC: 3.5 10*3/uL — AB (ref 3.9–10.3)

## 2017-03-12 MED ORDER — ONDANSETRON HCL 8 MG PO TABS
8.0000 mg | ORAL_TABLET | Freq: Once | ORAL | Status: AC
  Administered 2017-03-12: 8 mg via ORAL

## 2017-03-12 MED ORDER — ONDANSETRON HCL 8 MG PO TABS
ORAL_TABLET | ORAL | Status: AC
Start: 2017-03-12 — End: 2017-03-12
  Filled 2017-03-12: qty 1

## 2017-03-12 MED ORDER — BORTEZOMIB CHEMO SQ INJECTION 3.5 MG (2.5MG/ML)
1.3000 mg/m2 | Freq: Once | INTRAMUSCULAR | Status: AC
  Administered 2017-03-12: 3 mg via SUBCUTANEOUS
  Filled 2017-03-12: qty 3

## 2017-03-12 NOTE — Patient Instructions (Signed)
[facility] Discharge Instructions for Patients Receiving Chemotherapy  Today you received the following chemotherapy agents: Bortezomib (Velcade)  To help prevent nausea and vomiting after your treatment, we encourage you to take your nausea medication  as prescribed.    If you develop nausea and vomiting that is not controlled by your nausea medication, call the clinic.   BELOW ARE SYMPTOMS THAT SHOULD BE REPORTED IMMEDIATELY:  *FEVER GREATER THAN 100.5 F  *CHILLS WITH OR WITHOUT FEVER  NAUSEA AND VOMITING THAT IS NOT CONTROLLED WITH YOUR NAUSEA MEDICATION  *UNUSUAL SHORTNESS OF BREATH  *UNUSUAL BRUISING OR BLEEDING  TENDERNESS IN MOUTH AND THROAT WITH OR WITHOUT PRESENCE OF ULCERS  *URINARY PROBLEMS  *BOWEL PROBLEMS  UNUSUAL RASH Items with * indicate a potential emergency and should be followed up as soon as possible.  Feel free to call the clinic should you have any questions or concerns. The clinic phone number is [phone].  Please show the CHEMO ALERT CARD at check-in to the Emergency Department and triage nurse.   

## 2017-03-18 ENCOUNTER — Ambulatory Visit: Payer: BLUE CROSS/BLUE SHIELD

## 2017-03-18 ENCOUNTER — Other Ambulatory Visit: Payer: BLUE CROSS/BLUE SHIELD

## 2017-03-18 ENCOUNTER — Other Ambulatory Visit: Payer: Self-pay | Admitting: Internal Medicine

## 2017-03-18 ENCOUNTER — Other Ambulatory Visit: Payer: Self-pay | Admitting: *Deleted

## 2017-03-18 DIAGNOSIS — C9000 Multiple myeloma not having achieved remission: Secondary | ICD-10-CM

## 2017-03-19 ENCOUNTER — Inpatient Hospital Stay: Payer: BLUE CROSS/BLUE SHIELD

## 2017-03-19 ENCOUNTER — Other Ambulatory Visit: Payer: Self-pay | Admitting: *Deleted

## 2017-03-19 VITALS — BP 140/52 | HR 69 | Temp 97.9°F | Resp 18

## 2017-03-19 DIAGNOSIS — Z7984 Long term (current) use of oral hypoglycemic drugs: Secondary | ICD-10-CM | POA: Diagnosis not present

## 2017-03-19 DIAGNOSIS — C9000 Multiple myeloma not having achieved remission: Secondary | ICD-10-CM | POA: Diagnosis not present

## 2017-03-19 DIAGNOSIS — Z794 Long term (current) use of insulin: Secondary | ICD-10-CM | POA: Diagnosis not present

## 2017-03-19 DIAGNOSIS — Z79899 Other long term (current) drug therapy: Secondary | ICD-10-CM | POA: Diagnosis not present

## 2017-03-19 DIAGNOSIS — Z5112 Encounter for antineoplastic immunotherapy: Secondary | ICD-10-CM | POA: Diagnosis not present

## 2017-03-19 DIAGNOSIS — Z9221 Personal history of antineoplastic chemotherapy: Secondary | ICD-10-CM | POA: Diagnosis not present

## 2017-03-19 DIAGNOSIS — E119 Type 2 diabetes mellitus without complications: Secondary | ICD-10-CM | POA: Diagnosis not present

## 2017-03-19 LAB — CBC WITH DIFFERENTIAL (CANCER CENTER ONLY)
Basophils Absolute: 0 K/uL (ref 0.0–0.1)
Basophils Relative: 1 %
Eosinophils Absolute: 0.2 K/uL (ref 0.0–0.5)
Eosinophils Relative: 5 %
HCT: 31.5 % — ABNORMAL LOW (ref 34.8–46.6)
Hemoglobin: 9.9 g/dL — ABNORMAL LOW (ref 11.6–15.9)
Lymphocytes Relative: 19 %
Lymphs Abs: 0.8 K/uL — ABNORMAL LOW (ref 0.9–3.3)
MCH: 27.5 pg (ref 25.1–34.0)
MCHC: 31.4 g/dL — ABNORMAL LOW (ref 31.5–36.0)
MCV: 87.5 fL (ref 79.5–101.0)
Monocytes Absolute: 0.6 K/uL (ref 0.1–0.9)
Monocytes Relative: 14 %
Neutro Abs: 2.6 K/uL (ref 1.5–6.5)
Neutrophils Relative %: 61 %
Platelet Count: 113 K/uL — ABNORMAL LOW (ref 145–400)
RBC: 3.6 MIL/uL — ABNORMAL LOW (ref 3.70–5.45)
RDW: 15.3 % — ABNORMAL HIGH (ref 11.2–14.5)
WBC Count: 4.3 K/uL (ref 3.9–10.3)

## 2017-03-19 LAB — CMP (CANCER CENTER ONLY)
ALT: 48 U/L (ref 0–55)
AST: 39 U/L — ABNORMAL HIGH (ref 5–34)
Albumin: 3.7 g/dL (ref 3.5–5.0)
Alkaline Phosphatase: 120 U/L (ref 40–150)
Anion gap: 13 — ABNORMAL HIGH (ref 3–11)
BUN: 7 mg/dL (ref 7–26)
CO2: 22 mmol/L (ref 22–29)
Calcium: 9.3 mg/dL (ref 8.4–10.4)
Chloride: 104 mmol/L (ref 98–109)
Creatinine: 0.78 mg/dL (ref 0.60–1.10)
GFR, Est AFR Am: >60 mL/min
GFR, Estimated: >60 mL/min
Glucose, Bld: 121 mg/dL (ref 70–140)
Potassium: 3.7 mmol/L (ref 3.5–5.1)
Sodium: 139 mmol/L (ref 136–145)
Total Bilirubin: 0.6 mg/dL (ref 0.2–1.2)
Total Protein: 6.8 g/dL (ref 6.4–8.3)

## 2017-03-19 MED ORDER — ONDANSETRON HCL 8 MG PO TABS
8.0000 mg | ORAL_TABLET | Freq: Once | ORAL | Status: AC
  Administered 2017-03-19: 8 mg via ORAL

## 2017-03-19 MED ORDER — ONDANSETRON HCL 8 MG PO TABS
ORAL_TABLET | ORAL | Status: AC
  Filled 2017-03-19: qty 1

## 2017-03-19 MED ORDER — BORTEZOMIB CHEMO SQ INJECTION 3.5 MG (2.5MG/ML)
1.3000 mg/m2 | Freq: Once | INTRAMUSCULAR | Status: AC
  Administered 2017-03-19: 3 mg via SUBCUTANEOUS
  Filled 2017-03-19: qty 3

## 2017-03-19 NOTE — Patient Instructions (Signed)
[facility] Discharge Instructions for Patients Receiving Chemotherapy  Today you received the following chemotherapy agents: Bortezomib (Velcade)  To help prevent nausea and vomiting after your treatment, we encourage you to take your nausea medication  as prescribed.    If you develop nausea and vomiting that is not controlled by your nausea medication, call the clinic.   BELOW ARE SYMPTOMS THAT SHOULD BE REPORTED IMMEDIATELY:  *FEVER GREATER THAN 100.5 F  *CHILLS WITH OR WITHOUT FEVER  NAUSEA AND VOMITING THAT IS NOT CONTROLLED WITH YOUR NAUSEA MEDICATION  *UNUSUAL SHORTNESS OF BREATH  *UNUSUAL BRUISING OR BLEEDING  TENDERNESS IN MOUTH AND THROAT WITH OR WITHOUT PRESENCE OF ULCERS  *URINARY PROBLEMS  *BOWEL PROBLEMS  UNUSUAL RASH Items with * indicate a potential emergency and should be followed up as soon as possible.  Feel free to call the clinic should you have any questions or concerns. The clinic phone number is [phone].  Please show the CHEMO ALERT CARD at check-in to the Emergency Department and triage nurse.   

## 2017-03-20 MED ORDER — REVLIMID 25 MG PO CAPS: ORAL_CAPSULE | ORAL | 0 refills | Status: DC

## 2017-03-20 NOTE — Addendum Note (Signed)
Addended by: Ardeen Garland on: 03/20/2017 11:10 AM   Modules accepted: Orders

## 2017-03-25 ENCOUNTER — Other Ambulatory Visit: Payer: BLUE CROSS/BLUE SHIELD

## 2017-03-25 ENCOUNTER — Ambulatory Visit: Payer: BLUE CROSS/BLUE SHIELD

## 2017-03-25 ENCOUNTER — Ambulatory Visit: Payer: BLUE CROSS/BLUE SHIELD | Admitting: Internal Medicine

## 2017-03-26 ENCOUNTER — Encounter: Payer: Self-pay | Admitting: Internal Medicine

## 2017-03-26 ENCOUNTER — Other Ambulatory Visit: Payer: Self-pay | Admitting: Internal Medicine

## 2017-03-26 ENCOUNTER — Telehealth: Payer: Self-pay | Admitting: Internal Medicine

## 2017-03-26 ENCOUNTER — Inpatient Hospital Stay: Payer: BLUE CROSS/BLUE SHIELD

## 2017-03-26 ENCOUNTER — Inpatient Hospital Stay (HOSPITAL_BASED_OUTPATIENT_CLINIC_OR_DEPARTMENT_OTHER): Payer: BLUE CROSS/BLUE SHIELD | Admitting: Internal Medicine

## 2017-03-26 VITALS — BP 158/78 | HR 59 | Temp 97.8°F | Resp 18 | Ht 66.75 in | Wt 260.9 lb

## 2017-03-26 DIAGNOSIS — Z9221 Personal history of antineoplastic chemotherapy: Secondary | ICD-10-CM

## 2017-03-26 DIAGNOSIS — Z5112 Encounter for antineoplastic immunotherapy: Secondary | ICD-10-CM | POA: Diagnosis not present

## 2017-03-26 DIAGNOSIS — C9000 Multiple myeloma not having achieved remission: Secondary | ICD-10-CM

## 2017-03-26 DIAGNOSIS — Z794 Long term (current) use of insulin: Secondary | ICD-10-CM | POA: Diagnosis not present

## 2017-03-26 DIAGNOSIS — Z79899 Other long term (current) drug therapy: Secondary | ICD-10-CM | POA: Diagnosis not present

## 2017-03-26 DIAGNOSIS — Z7984 Long term (current) use of oral hypoglycemic drugs: Secondary | ICD-10-CM | POA: Diagnosis not present

## 2017-03-26 DIAGNOSIS — E119 Type 2 diabetes mellitus without complications: Secondary | ICD-10-CM | POA: Diagnosis not present

## 2017-03-26 LAB — CMP (CANCER CENTER ONLY)
ALT: 49 U/L (ref 0–55)
ANION GAP: 12 — AB (ref 3–11)
AST: 38 U/L — AB (ref 5–34)
Albumin: 3.7 g/dL (ref 3.5–5.0)
Alkaline Phosphatase: 114 U/L (ref 40–150)
BILIRUBIN TOTAL: 0.7 mg/dL (ref 0.2–1.2)
BUN: 6 mg/dL — AB (ref 7–26)
CHLORIDE: 107 mmol/L (ref 98–109)
CO2: 20 mmol/L — ABNORMAL LOW (ref 22–29)
Calcium: 9 mg/dL (ref 8.4–10.4)
Creatinine: 0.74 mg/dL (ref 0.60–1.10)
GFR, Est AFR Am: >60 mL/min (ref >60)
Glucose, Bld: 130 mg/dL (ref 70–140)
POTASSIUM: 3.2 mmol/L — AB (ref 3.5–5.1)
Sodium: 139 mmol/L (ref 136–145)
TOTAL PROTEIN: 6.6 g/dL (ref 6.4–8.3)

## 2017-03-26 LAB — CBC WITH DIFFERENTIAL (CANCER CENTER ONLY)
Basophils Absolute: 0 10*3/uL (ref 0.0–0.1)
Basophils Relative: 1 %
EOS PCT: 6 %
Eosinophils Absolute: 0.2 10*3/uL (ref 0.0–0.5)
HEMATOCRIT: 29 % — AB (ref 34.8–46.6)
Hemoglobin: 9.5 g/dL — ABNORMAL LOW (ref 11.6–15.9)
LYMPHS ABS: 0.6 10*3/uL — AB (ref 0.9–3.3)
LYMPHS PCT: 20 %
MCH: 27.8 pg (ref 25.1–34.0)
MCHC: 32.8 g/dL (ref 31.5–36.0)
MCV: 84.6 fL (ref 79.5–101.0)
MONO ABS: 0.3 10*3/uL (ref 0.1–0.9)
Monocytes Relative: 11 %
NEUTROS ABS: 1.9 10*3/uL (ref 1.5–6.5)
Neutrophils Relative %: 62 %
PLATELETS: 109 10*3/uL — AB (ref 145–400)
RBC: 3.43 MIL/uL — ABNORMAL LOW (ref 3.70–5.45)
RDW: 17 % — AB (ref 11.2–14.5)
WBC Count: 3.1 10*3/uL — ABNORMAL LOW (ref 3.9–10.3)

## 2017-03-26 MED ORDER — ONDANSETRON HCL 8 MG PO TABS
ORAL_TABLET | ORAL | Status: AC
  Filled 2017-03-26: qty 1

## 2017-03-26 MED ORDER — BORTEZOMIB CHEMO SQ INJECTION 3.5 MG (2.5MG/ML)
1.3000 mg/m2 | Freq: Once | INTRAMUSCULAR | Status: AC
  Administered 2017-03-26: 3 mg via SUBCUTANEOUS
  Filled 2017-03-26: qty 3

## 2017-03-26 MED ORDER — ONDANSETRON HCL 8 MG PO TABS
8.0000 mg | ORAL_TABLET | Freq: Once | ORAL | Status: AC
  Administered 2017-03-26: 8 mg via ORAL

## 2017-03-26 NOTE — Progress Notes (Signed)
[place] [phone]   [phone]  OFFICE PROGRESS NOTE  [name], MD [place] [address]  DIAGNOSIS: Plasma cell dyscrasia initially diagnosed as MGUS in September 2010, with additional symptoms suggestive of POEMS syndrome.   PRIOR THERAPY:  1) Velcade 1.3 MG/M2 subcutaneously with Decadron 40 mg by mouth on a weekly basis. First cycle 11/24/2013. She status post 31 weekly doses of treatment. 2) Velcade 1.3 MG/M2 subcutaneously and weekly basis with Decadron 40 mg by mouth weekly. First dose 02/01/2015. Status post 28 cycles. 3) Revlimid 25 mg by mouth daily for 21 days every 4 weeks with weekly Decadron 20 mg. started in 11/27/2015. Status post 3 cycles discontinued secondary to lack of response.  CURRENT THERAPY: Systemic treatment with Velcade 1.3 MG/KG weekly, Revlimid 25 mg by mouth daily for 21 days every 4 weeks in addition to Decadron 20 mg by mouth weekly. First dose 03/06/2016. Status post 12 cycles.   INTERVAL HISTORY: Brenda Conner 57 y.o. female returns to the clinic today for follow-up visit.  The patient is feeling fine today with no specific complaints except for fatigue.  She denied having any chest pain, shortness of breath, cough or hemoptysis.  She denied having any fever or chills.  She has no nausea, vomiting, diarrhea or constipation.  She has no bleeding issues.  The patient is here today for evaluation before receiving her dose of Velcade.   MEDICAL HISTORY: Past Medical History:  Diagnosis Date  . Acid reflux   . Anxiety   . Asthma   . Cancer [facility])    waldenstroms/ macroglobinulemia  . Depression   . Depression   . Diabetes mellitus without complication ([place])   . Dysuria 02/28/2016  . Hypercholesteremia   . Hypertension   . Hypothyroidism   . Macroglobulinemia ([place])    ? POEMS syndrome  . Multiple myeloma ([place])   . Obesity   . PONV (postoperative nausea and vomiting)   . Sleep  apnea    CPAP at bedtime  . Sleep apnea     ALLERGIES:  is allergic to codeine; hydrocodone; lortab [hydrocodone-acetaminophen]; onion; shellfish allergy; and amoxicillin.  MEDICATIONS:  Current Outpatient Medications  Medication Sig Dispense Refill  . acyclovir (ZOVIRAX) 400 MG tablet Take 1 tablet (400 mg total) by mouth 2 (two) times daily. 180 tablet 0  . ALPRAZolam (XANAX) 0.5 MG tablet Take 1 tablet (0.5 mg total) by mouth at bedtime as needed for anxiety. 30 tablet 0  . Azelastine HCl 0.15 % SOLN as needed.    . B-D UF III MINI PEN NEEDLES 31G X 5 MM MISC     . Blood Glucose Monitoring Suppl (ONE TOUCH ULTRA MINI) W/DEVICE KIT See admin instructions. Reported on 01/25/2015  0  . Bortezomib (VELCADE IJ) Inject as directed once a week.    . Cetirizine HCl (ZYRTEC ALLERGY PO) Take by mouth daily.    [name] dexamethasone (DECADRON) 4 MG tablet 5 tablet by mouth every week. Start with the first dose of chemotherapy. 40 tablet 3  . DiphenhydrAMINE HCl (BENADRYL ALLERGY PO) Take by mouth as needed.    [name] EPIPEN 2-PAK 0.3 MG/0.3ML SOAJ injection Reported on 06/21/2015  1  . Fexofenadine HCl (ALLEGRA ALLERGY PO) Take by mouth daily.    . Fluticasone-Salmeterol (ADVAIR) 100-50 MCG/DOSE AEPB Inhale 2 puffs into the lungs every 12 (twelve) hours.    [name] ibuprofen (ADVIL,MOTRIN) 100 MG tablet Take 100 mg by  mouth every 6 (six) hours as needed. Reported on 05/31/2015    . insulin glargine (LANTUS) 100 UNIT/ML injection Inject 8 Units into the skin 2 (two) times a week. Reported on 02/18/2015    . levothyroxine (SYNTHROID, LEVOTHROID) 150 MCG tablet Take 150 mcg by mouth daily before breakfast.    . lisinopril (PRINIVIL,ZESTRIL) 10 MG tablet Take 10 mg by mouth daily.  3  . loperamide (IMODIUM) 2 MG capsule Take 2 mg by mouth as needed for diarrhea or loose stools.    . meloxicam (MOBIC) 15 MG tablet Take 15 mg daily by mouth.  3  . metFORMIN (GLUCOPHAGE) 1000 MG tablet Take 1,000 mg by mouth 2 (two) times  daily.  3  . NOVOLOG FLEXPEN 100 UNIT/ML FlexPen 3 Units daily as needed. Reported on 01/25/2015  6  . omeprazole (PRILOSEC) 40 MG capsule Take 40 mg by mouth 2 (two) times daily.     . ondansetron (ZOFRAN-ODT) 8 MG disintegrating tablet Take 1 tablet (8 mg total) by mouth every 8 (eight) hours as needed. Reported on 05/31/2015 20 tablet 2  . ONE TOUCH ULTRA TEST test strip See admin instructions. Reported on 01/25/2015  11  . pravastatin (PRAVACHOL) 40 MG tablet Take 40 mg by mouth every evening.     [name] PROAIR HFA 108 (90 Base) MCG/ACT inhaler Reported on 06/21/2015  1  . REVLIMID 25 MG capsule Take one capsule daily for 21 days then off for 7 days . Adult female not of childbearing potential. auth number 03/20/17 [id number] 21 capsule 0  . sertraline (ZOLOFT) 50 MG tablet TAKE 3 TABLETS BY MOUTH EVERY DAY 270 tablet 3  . verapamil (CALAN-SR) 240 MG CR tablet Take 240 mg by mouth 2 (two) times daily.    [name] warfarin (COUMADIN) 2 MG tablet Take 1 tablet (2 mg total) by mouth daily. 90 tablet 0   No current facility-administered medications for this visit.     SURGICAL HISTORY:  Past Surgical History:  Procedure Laterality Date  . ABLATION  09/2007   HTA and polyp resection  . BACK SURGERY  03/2004   herniation, L4-L5  . Bil Laprascopic knee surgery    . BONE MARROW BIOPSY     2011  . BONE MARROW BIOPSY  4/14  . CHOLECYSTECTOMY    . FOOT SURGERY  1998  . KNEE ARTHROSCOPY W/ MENISCAL REPAIR  10/10 ; 3/11  . LAPAROSCOPIC CHOLECYSTECTOMY  1997  . NASAL SINUS SURGERY  2004  . UTERINE FIBROID EMBOLIZATION      REVIEW OF SYSTEMS:  A comprehensive review of systems was negative except for: Constitutional: positive for fatigue   PHYSICAL EXAMINATION: General appearance: alert, cooperative, fatigued and no distress Head: Normocephalic, without obvious abnormality, atraumatic Neck: no adenopathy Lymph nodes: Cervical, supraclavicular, and axillary nodes normal. Resp: clear to auscultation  bilaterally Back: symmetric, no curvature. ROM normal. No CVA tenderness. Cardio: regular rate and rhythm, S1, S2 normal, no murmur, click, rub or gallop GI: soft, non-tender; bowel sounds normal; no masses,  no organomegaly Extremities: extremities normal, atraumatic, no cyanosis or edema  ECOG PERFORMANCE STATUS: 1 - Symptomatic but completely ambulatory  Blood pressure (!) 158/78, pulse (!) 59, temperature 97.8 F (36.6 C), temperature source Oral, resp. rate 18, height 5' 6.75" (1.695 m), weight 260 lb 14.4 oz (118.3 kg), SpO2 100 %.  LABORATORY DATA: Lab Results  Component Value Date   WBC 3.1 (L) 03/26/2017   HGB 10.6 (L) 02/12/2017   HCT 29.0 (L)  03/26/2017   MCV 84.6 03/26/2017   PLT 109 (L) 03/26/2017      Chemistry      Component Value Date/Time   NA 139 03/19/2017 0806   NA 138 11/14/2016 0824   K 3.7 03/19/2017 0806   K 4.3 11/14/2016 0824   CL 104 03/19/2017 0806   CL 104 04/07/2012 1415   CO2 22 03/19/2017 0806   CO2 23 11/14/2016 0824   BUN 7 03/19/2017 0806   BUN 9.1 11/14/2016 0824   CREATININE 0.78 03/19/2017 0806   CREATININE 0.7 11/14/2016 0824      Component Value Date/Time   CALCIUM 9.3 03/19/2017 0806   CALCIUM 9.6 11/14/2016 0824   ALKPHOS 120 03/19/2017 0806   ALKPHOS 94 11/14/2016 0824   AST 39 (H) 03/19/2017 0806   AST 28 11/14/2016 0824   ALT 48 03/19/2017 0806   ALT 24 11/14/2016 0824   BILITOT 0.6 03/19/2017 0806   BILITOT 0.41 11/14/2016 0824      ASSESSMENT AND PLAN:  This is a very pleasant 57 years old white female with multiple myeloma status post several chemotherapy regimens. The patient has been on treatment with subcutaneous weekly Velcade as well as Revlimid and DecadronStatus post 12 cycles.  The patient continues to do well and tolerating this treatment with no significant complaints except for fatigue. I recommended for her to continue her current treatment as scheduled. I will see her back for follow-up visit in 4  weeks for evaluation after repeating myeloma panel. She was advised to call immediately if she has any concerning symptoms in the interval. All questions were answered. The patient knows to call the clinic with any problems, questions or concerns. We can certainly see the patient much sooner if necessary. I spent 10 minutes counseling the patient face to face. The total time spent in the appointment was 15 minutes. Disclaimer: This note was dictated with voice recognition software. Similar sounding words can inadvertently be transcribed and may not be corrected upon review.

## 2017-03-26 NOTE — Patient Instructions (Signed)
Potassium Content of Foods Potassium is a mineral found in many foods and drinks. It helps keep fluids and minerals balanced in your body and affects how steadily your heart beats. Potassium also helps control your blood pressure and keep your muscles and nervous system healthy. Certain health conditions and medicines may change the balance of potassium in your body. When this happens, you can help balance your level of potassium through the foods that you do or do not eat. Your health care provider or dietitian may recommend an amount of potassium that you should have each day. The following lists of foods provide the amount of potassium (in parentheses) per serving in each item. High in potassium The following foods and beverages have 200 mg or more of potassium per serving:  Apricots, 2 raw or 5 dry (200 mg).  Artichoke, 1 medium (345 mg).  Avocado, raw,  each (245 mg).  Banana, 1 medium (425 mg).  Beans, lima, or baked beans, canned,  cup (280 mg).  Beans, white, canned,  cup (595 mg).  Beef roast, 3 oz (320 mg).  Beef, ground, 3 oz (270 mg).  Beets, raw or cooked,  cup (260 mg).  Bran muffin, 2 oz (300 mg).  Broccoli,  cup (230 mg).  Brussels sprouts,  cup (250 mg).  Cantaloupe,  cup (215 mg).  Cereal, 100% bran,  cup (200-400 mg).  Cheeseburger, single, fast food, 1 each (225-400 mg).  Chicken, 3 oz (220 mg).  Clams, canned, 3 oz (535 mg).  Crab, 3 oz (225 mg).  Dates, 5 each (270 mg).  Dried beans and peas,  cup (300-475 mg).  Figs, dried, 2 each (260 mg).  Fish: halibut, tuna, cod, snapper, 3 oz (480 mg).  Fish: salmon, haddock, swordfish, perch, 3 oz (300 mg).  Fish, tuna, canned 3 oz (200 mg).  [place] fries, fast food, 3 oz (470 mg).  Granola with fruit and nuts,  cup (200 mg).  Grapefruit juice,  cup (200 mg).  Greens, beet,  cup (655 mg).  Honeydew melon,  cup (200 mg).  Kale, raw, 1 cup (300 mg).  Kiwi, 1 medium (240  mg).  Kohlrabi, rutabaga, parsnips,  cup (280 mg).  Lentils,  cup (365 mg).  Mango, 1 each (325 mg).  Milk, chocolate, 1 cup (420 mg).  Milk: nonfat, low-fat, whole, buttermilk, 1 cup (350-380 mg).  Molasses, 1 Tbsp (295 mg).  Mushrooms,  cup (280) mg.  Nectarine, 1 each (275 mg).  Nuts: almonds, peanuts, hazelnuts, [place], cashew, mixed, 1 oz (200 mg).  Nuts, pistachios, 1 oz (295 mg).  Orange, 1 each (240 mg).  Orange juice,  cup (235 mg).  Papaya, medium,  fruit (390 mg).  Peanut butter, chunky, 2 Tbsp (240 mg).  Peanut butter, smooth, 2 Tbsp (210 mg).  Pear, 1 medium (200 mg).  Pomegranate, 1 whole (400 mg).  Pomegranate juice,  cup (215 mg).  Pork, 3 oz (350 mg).  Potato chips, salted, 1 oz (465 mg).  Potato, baked with skin, 1 medium (925 mg).  Potatoes, boiled,  cup (255 mg).  Potatoes, mashed,  cup (330 mg).  Prune juice,  cup (370 mg).  Prunes, 5 each (305 mg).  Pudding, chocolate,  cup (230 mg).  Pumpkin, canned,  cup (250 mg).  Raisins, seedless,  cup (270 mg).  Seeds, sunflower or pumpkin, 1 oz (240 mg).  Soy milk, 1 cup (300 mg).  Spinach,  cup (420 mg).  Spinach, canned,  cup (370 mg).  Sweet  potato, baked with skin, 1 medium (450 mg).  Swiss chard,  cup (480 mg).  Tomato or vegetable juice,  cup (275 mg).  Tomato sauce or puree,  cup (400-550 mg).  Tomato, raw, 1 medium (290 mg).  Tomatoes, canned,  cup (200-300 mg).  [place], 3 oz (250 mg).  Wheat germ, 1 oz (250 mg).  Winter squash,  cup (250 mg).  Yogurt, plain or fruited, 6 oz (260-435 mg).  Zucchini,  cup (220 mg).  Moderate in potassium The following foods and beverages have 50-200 mg of potassium per serving:  Apple, 1 each (150 mg).  Apple juice,  cup (150 mg).  Applesauce,  cup (90 mg).  Apricot nectar,  cup (140 mg).  Asparagus, small spears,  cup or 6 spears (155 mg).  Bagel, cinnamon raisin, 1 each (130 mg).  Bagel,  egg or plain, 4 in., 1 each (70 mg).  Beans, green,  cup (90 mg).  Beans, yellow,  cup (190 mg).  Beer, regular, 12 oz (100 mg).  Beets, canned,  cup (125 mg).  Blackberries,  cup (115 mg).  Blueberries,  cup (60 mg).  Bread, whole wheat, 1 slice (70 mg).  Broccoli, raw,  cup (145 mg).  Cabbage,  cup (150 mg).  Carrots, cooked or raw,  cup (180 mg).  Cauliflower, raw,  cup (150 mg).  Celery, raw,  cup (155 mg).  Cereal, bran flakes, cup (120-150 mg).  Cheese, cottage,  cup (110 mg).  Cherries, 10 each (150 mg).  Chocolate, 1 oz bar (165 mg).  Coffee, brewed 6 oz (90 mg).  Corn,  cup or 1 ear (195 mg).  Cucumbers,  cup (80 mg).  Egg, large, 1 each (60 mg).  Eggplant,  cup (60 mg).  Endive, raw, cup (80 mg).  English muffin, 1 each (65 mg).  Fish, orange roughy, 3 oz (150 mg).  Frankfurter, beef or pork, 1 each (75 mg).  Fruit cocktail,  cup (115 mg).  Grape juice,  cup (170 mg).  Grapefruit,  fruit (175 mg).  Grapes,  cup (155 mg).  Greens: kale, turnip, collard,  cup (110-150 mg).  Ice cream or frozen yogurt, chocolate,  cup (175 mg).  Ice cream or frozen yogurt, vanilla,  cup (120-150 mg).  Lemons, limes, 1 each (80 mg).  Lettuce, all types, 1 cup (100 mg).  Mixed vegetables,  cup (150 mg).  Mushrooms, raw,  cup (110 mg).  Nuts: walnuts, pecans, or macadamia, 1 oz (125 mg).  Oatmeal,  cup (80 mg).  Okra,  cup (110 mg).  Onions, raw,  cup (120 mg).  Peach, 1 each (185 mg).  Peaches, canned,  cup (120 mg).  Pears, canned,  cup (120 mg).  Peas, green, frozen,  cup (90 mg).  Peppers, green,  cup (130 mg).  Peppers, red,  cup (160 mg).  Pineapple juice,  cup (165 mg).  Pineapple, fresh or canned,  cup (100 mg).  Plums, 1 each (105 mg).  Pudding, vanilla,  cup (150 mg).  Raspberries,  cup (90 mg).  Rhubarb,  cup (115 mg).  Rice, wild,  cup (80 mg).  Shrimp, 3 oz (155  mg).  Spinach, raw, 1 cup (170 mg).  Strawberries,  cup (125 mg).  Summer squash  cup (175-200 mg).  Swiss chard, raw, 1 cup (135 mg).  Tangerines, 1 each (140 mg).  Tea, brewed, 6 oz (65 mg).  Turnips,  cup (140 mg).  Watermelon,  cup (85 mg).  Wine, red, table,  5 oz (180 mg).  Wine, white, table, 5 oz (100 mg).  Low in potassium The following foods and beverages have less than 50 mg of potassium per serving.  Bread, white, 1 slice (30 mg).  Carbonated beverages, 12 oz (less than 5 mg).  Cheese, 1 oz (20-30 mg).  Cranberries,  cup (45 mg).  Cranberry juice cocktail,  cup (20 mg).  Fats and oils, 1 Tbsp (less than 5 mg).  Hummus, 1 Tbsp (32 mg).  Nectar: papaya, mango, or pear,  cup (35 mg).  Rice, white or brown,  cup (50 mg).  Spaghetti or macaroni,  cup cooked (30 mg).  Tortilla, flour or corn, 1 each (50 mg).  Waffle, 4 in., 1 each (50 mg).  Water chestnuts,  cup (40 mg).  This information is not intended to replace advice given to you by your health care provider. Make sure you discuss any questions you have with your health care provider. Document Released: 08/08/2004 Document Revised: 06/02/2015 Document Reviewed: 11/21/2012 Elsevier Interactive Patient Education  2018 Story Discharge Instructions for Patients Receiving Chemotherapy  Today you received the following chemotherapy agents: Bortezomib (Velcade).  To help prevent nausea and vomiting after your treatment, we encourage you to take your nausea medication as prescribed.  If you develop nausea and vomiting that is not controlled by your nausea medication, call the clinic.   BELOW ARE SYMPTOMS THAT SHOULD BE REPORTED IMMEDIATELY:  *FEVER GREATER THAN 100.5 F  *CHILLS WITH OR WITHOUT FEVER  NAUSEA AND VOMITING THAT IS NOT CONTROLLED WITH YOUR NAUSEA MEDICATION  *UNUSUAL SHORTNESS OF BREATH  *UNUSUAL BRUISING OR BLEEDING  TENDERNESS IN  MOUTH AND THROAT WITH OR WITHOUT PRESENCE OF ULCERS  *URINARY PROBLEMS  *BOWEL PROBLEMS  UNUSUAL RASH Items with * indicate a potential emergency and should be followed up as soon as possible.  Feel free to call the clinic should you have any questions or concerns. The clinic phone number is [phone].  Please show the [place] at check-in to the Emergency Department and triage nurse.

## 2017-03-26 NOTE — Telephone Encounter (Signed)
Scheduled appt per 3/19 los - patient to get an updated schedule next visit.  

## 2017-04-01 ENCOUNTER — Other Ambulatory Visit: Payer: Self-pay | Admitting: Medical Oncology

## 2017-04-01 ENCOUNTER — Other Ambulatory Visit: Payer: BLUE CROSS/BLUE SHIELD

## 2017-04-01 ENCOUNTER — Ambulatory Visit: Payer: BLUE CROSS/BLUE SHIELD

## 2017-04-01 DIAGNOSIS — C9000 Multiple myeloma not having achieved remission: Secondary | ICD-10-CM

## 2017-04-02 ENCOUNTER — Inpatient Hospital Stay: Payer: BLUE CROSS/BLUE SHIELD

## 2017-04-02 VITALS — BP 114/72 | HR 53 | Temp 97.9°F | Resp 17 | Wt 265.0 lb

## 2017-04-02 DIAGNOSIS — C9000 Multiple myeloma not having achieved remission: Secondary | ICD-10-CM

## 2017-04-02 DIAGNOSIS — Z79899 Other long term (current) drug therapy: Secondary | ICD-10-CM | POA: Diagnosis not present

## 2017-04-02 DIAGNOSIS — Z7984 Long term (current) use of oral hypoglycemic drugs: Secondary | ICD-10-CM | POA: Diagnosis not present

## 2017-04-02 DIAGNOSIS — Z9221 Personal history of antineoplastic chemotherapy: Secondary | ICD-10-CM | POA: Diagnosis not present

## 2017-04-02 DIAGNOSIS — E119 Type 2 diabetes mellitus without complications: Secondary | ICD-10-CM | POA: Diagnosis not present

## 2017-04-02 DIAGNOSIS — Z5112 Encounter for antineoplastic immunotherapy: Secondary | ICD-10-CM | POA: Diagnosis not present

## 2017-04-02 DIAGNOSIS — Z794 Long term (current) use of insulin: Secondary | ICD-10-CM | POA: Diagnosis not present

## 2017-04-02 LAB — CBC WITH DIFFERENTIAL (CANCER CENTER ONLY)
Basophils Absolute: 0.1 10*3/uL (ref 0.0–0.1)
Basophils Relative: 2 %
Eosinophils Absolute: 0.1 10*3/uL (ref 0.0–0.5)
Eosinophils Relative: 2 %
HEMATOCRIT: 28.2 % — AB (ref 34.8–46.6)
HEMOGLOBIN: 9.3 g/dL — AB (ref 11.6–15.9)
LYMPHS ABS: 0.7 10*3/uL — AB (ref 0.9–3.3)
Lymphocytes Relative: 20 %
MCH: 28 pg (ref 25.1–34.0)
MCHC: 32.9 g/dL (ref 31.5–36.0)
MCV: 85.2 fL (ref 79.5–101.0)
MONO ABS: 0.3 10*3/uL (ref 0.1–0.9)
MONOS PCT: 10 %
NEUTROS ABS: 2.2 10*3/uL (ref 1.5–6.5)
NEUTROS PCT: 66 %
Platelet Count: 111 10*3/uL — ABNORMAL LOW (ref 145–400)
RBC: 3.31 MIL/uL — ABNORMAL LOW (ref 3.70–5.45)
RDW: 17.2 % — ABNORMAL HIGH (ref 11.2–14.5)
WBC Count: 3.3 10*3/uL — ABNORMAL LOW (ref 3.9–10.3)

## 2017-04-02 LAB — CMP (CANCER CENTER ONLY)
ALK PHOS: 103 U/L (ref 40–150)
ALT: 33 U/L (ref 0–55)
ANION GAP: 9 (ref 3–11)
AST: 30 U/L (ref 5–34)
Albumin: 3.6 g/dL (ref 3.5–5.0)
BILIRUBIN TOTAL: 0.6 mg/dL (ref 0.2–1.2)
BUN: 12 mg/dL (ref 7–26)
CALCIUM: 9.2 mg/dL (ref 8.4–10.4)
CO2: 23 mmol/L (ref 22–29)
Chloride: 108 mmol/L (ref 98–109)
Creatinine: 0.77 mg/dL (ref 0.60–1.10)
GLUCOSE: 116 mg/dL (ref 70–140)
Potassium: 3.8 mmol/L (ref 3.5–5.1)
Sodium: 140 mmol/L (ref 136–145)
TOTAL PROTEIN: 6.5 g/dL (ref 6.4–8.3)

## 2017-04-02 MED ORDER — ONDANSETRON HCL 8 MG PO TABS
ORAL_TABLET | ORAL | Status: AC
Start: 2017-04-02 — End: 2017-04-02
  Filled 2017-04-02: qty 1

## 2017-04-02 MED ORDER — ONDANSETRON HCL 8 MG PO TABS
8.0000 mg | ORAL_TABLET | Freq: Once | ORAL | Status: AC
  Administered 2017-04-02: 8 mg via ORAL

## 2017-04-02 MED ORDER — BORTEZOMIB CHEMO SQ INJECTION 3.5 MG (2.5MG/ML)
1.3000 mg/m2 | Freq: Once | INTRAMUSCULAR | Status: AC
  Administered 2017-04-02: 3 mg via SUBCUTANEOUS
  Filled 2017-04-02: qty 3

## 2017-04-02 NOTE — Patient Instructions (Signed)
[facility] Discharge Instructions for Patients Receiving Chemotherapy  Today you received the following chemotherapy agent:  Velcade.  To help prevent nausea and vomiting after your treatment, we encourage you to take your nausea medication as directed.   If you develop nausea and vomiting that is not controlled by your nausea medication, call the clinic.   BELOW ARE SYMPTOMS THAT SHOULD BE REPORTED IMMEDIATELY:  *FEVER GREATER THAN 100.5 F  *CHILLS WITH OR WITHOUT FEVER  NAUSEA AND VOMITING THAT IS NOT CONTROLLED WITH YOUR NAUSEA MEDICATION  *UNUSUAL SHORTNESS OF BREATH  *UNUSUAL BRUISING OR BLEEDING  TENDERNESS IN MOUTH AND THROAT WITH OR WITHOUT PRESENCE OF ULCERS  *URINARY PROBLEMS  *BOWEL PROBLEMS  UNUSUAL RASH Items with * indicate a potential emergency and should be followed up as soon as possible.  Feel free to call the clinic should you have any questions or concerns. The clinic phone number is [phone].  Please show the CHEMO ALERT CARD at check-in to the Emergency Department and triage nurse.   

## 2017-04-08 ENCOUNTER — Ambulatory Visit: Payer: BLUE CROSS/BLUE SHIELD

## 2017-04-08 ENCOUNTER — Other Ambulatory Visit: Payer: BLUE CROSS/BLUE SHIELD

## 2017-04-09 ENCOUNTER — Inpatient Hospital Stay: Payer: BLUE CROSS/BLUE SHIELD

## 2017-04-09 ENCOUNTER — Other Ambulatory Visit: Payer: Self-pay | Admitting: Medical Oncology

## 2017-04-09 ENCOUNTER — Inpatient Hospital Stay: Payer: BLUE CROSS/BLUE SHIELD | Attending: Internal Medicine

## 2017-04-09 VITALS — BP 144/61 | HR 63 | Temp 98.1°F | Resp 18

## 2017-04-09 DIAGNOSIS — I10 Essential (primary) hypertension: Secondary | ICD-10-CM | POA: Insufficient documentation

## 2017-04-09 DIAGNOSIS — R0602 Shortness of breath: Secondary | ICD-10-CM | POA: Diagnosis not present

## 2017-04-09 DIAGNOSIS — Z5112 Encounter for antineoplastic immunotherapy: Secondary | ICD-10-CM | POA: Insufficient documentation

## 2017-04-09 DIAGNOSIS — R530 Neoplastic (malignant) related fatigue: Secondary | ICD-10-CM | POA: Insufficient documentation

## 2017-04-09 DIAGNOSIS — Z7984 Long term (current) use of oral hypoglycemic drugs: Secondary | ICD-10-CM | POA: Diagnosis not present

## 2017-04-09 DIAGNOSIS — Z794 Long term (current) use of insulin: Secondary | ICD-10-CM | POA: Insufficient documentation

## 2017-04-09 DIAGNOSIS — Z79899 Other long term (current) drug therapy: Secondary | ICD-10-CM | POA: Insufficient documentation

## 2017-04-09 DIAGNOSIS — C9000 Multiple myeloma not having achieved remission: Secondary | ICD-10-CM

## 2017-04-09 DIAGNOSIS — E119 Type 2 diabetes mellitus without complications: Secondary | ICD-10-CM | POA: Insufficient documentation

## 2017-04-09 LAB — CBC WITH DIFFERENTIAL (CANCER CENTER ONLY)
BASOS ABS: 0 10*3/uL (ref 0.0–0.1)
Basophils Relative: 2 %
EOS ABS: 0.1 10*3/uL (ref 0.0–0.5)
EOS PCT: 3 %
HCT: 31.5 % — ABNORMAL LOW (ref 34.8–46.6)
HEMOGLOBIN: 10.2 g/dL — AB (ref 11.6–15.9)
LYMPHS ABS: 0.5 10*3/uL — AB (ref 0.9–3.3)
LYMPHS PCT: 19 %
MCH: 27.6 pg (ref 25.1–34.0)
MCHC: 32.3 g/dL (ref 31.5–36.0)
MCV: 85.4 fL (ref 79.5–101.0)
Monocytes Absolute: 0.2 10*3/uL (ref 0.1–0.9)
Monocytes Relative: 7 %
NEUTROS PCT: 69 %
Neutro Abs: 1.7 10*3/uL (ref 1.5–6.5)
PLATELETS: 120 10*3/uL — AB (ref 145–400)
RBC: 3.69 MIL/uL — ABNORMAL LOW (ref 3.70–5.45)
RDW: 17.1 % — ABNORMAL HIGH (ref 11.2–14.5)
WBC Count: 2.5 10*3/uL — ABNORMAL LOW (ref 3.9–10.3)

## 2017-04-09 LAB — CMP (CANCER CENTER ONLY)
ALT: 34 U/L (ref 0–55)
AST: 30 U/L (ref 5–34)
Albumin: 3.7 g/dL (ref 3.5–5.0)
Alkaline Phosphatase: 107 U/L (ref 40–150)
Anion gap: 9 (ref 3–11)
BILIRUBIN TOTAL: 0.6 mg/dL (ref 0.2–1.2)
BUN: 8 mg/dL (ref 7–26)
CHLORIDE: 103 mmol/L (ref 98–109)
CO2: 27 mmol/L (ref 22–29)
CREATININE: 0.75 mg/dL (ref 0.60–1.10)
Calcium: 9.5 mg/dL (ref 8.4–10.4)
GFR, Estimated: >60 mL/min (ref >60)
Glucose, Bld: 111 mg/dL (ref 70–140)
POTASSIUM: 3.9 mmol/L (ref 3.5–5.1)
Sodium: 139 mmol/L (ref 136–145)
TOTAL PROTEIN: 6.6 g/dL (ref 6.4–8.3)

## 2017-04-09 MED ORDER — ONDANSETRON HCL 8 MG PO TABS
8.0000 mg | ORAL_TABLET | Freq: Once | ORAL | Status: AC
  Administered 2017-04-09: 8 mg via ORAL

## 2017-04-09 MED ORDER — BORTEZOMIB CHEMO SQ INJECTION 3.5 MG (2.5MG/ML)
1.3000 mg/m2 | Freq: Once | INTRAMUSCULAR | Status: AC
  Administered 2017-04-09: 3 mg via SUBCUTANEOUS
  Filled 2017-04-09: qty 3

## 2017-04-09 MED ORDER — ONDANSETRON HCL 8 MG PO TABS
ORAL_TABLET | ORAL | Status: AC
  Filled 2017-04-09: qty 1

## 2017-04-15 ENCOUNTER — Other Ambulatory Visit: Payer: BLUE CROSS/BLUE SHIELD

## 2017-04-15 ENCOUNTER — Ambulatory Visit: Payer: BLUE CROSS/BLUE SHIELD

## 2017-04-16 ENCOUNTER — Inpatient Hospital Stay: Payer: BLUE CROSS/BLUE SHIELD

## 2017-04-16 ENCOUNTER — Other Ambulatory Visit: Payer: Self-pay | Admitting: Internal Medicine

## 2017-04-16 VITALS — BP 148/72 | HR 66 | Temp 98.6°F | Resp 16

## 2017-04-16 DIAGNOSIS — R0602 Shortness of breath: Secondary | ICD-10-CM | POA: Diagnosis not present

## 2017-04-16 DIAGNOSIS — C9000 Multiple myeloma not having achieved remission: Secondary | ICD-10-CM

## 2017-04-16 DIAGNOSIS — E119 Type 2 diabetes mellitus without complications: Secondary | ICD-10-CM | POA: Diagnosis not present

## 2017-04-16 DIAGNOSIS — Z7984 Long term (current) use of oral hypoglycemic drugs: Secondary | ICD-10-CM | POA: Diagnosis not present

## 2017-04-16 DIAGNOSIS — Z794 Long term (current) use of insulin: Secondary | ICD-10-CM | POA: Diagnosis not present

## 2017-04-16 DIAGNOSIS — Z79899 Other long term (current) drug therapy: Secondary | ICD-10-CM | POA: Diagnosis not present

## 2017-04-16 DIAGNOSIS — I10 Essential (primary) hypertension: Secondary | ICD-10-CM | POA: Diagnosis not present

## 2017-04-16 DIAGNOSIS — R530 Neoplastic (malignant) related fatigue: Secondary | ICD-10-CM | POA: Diagnosis not present

## 2017-04-16 DIAGNOSIS — Z5112 Encounter for antineoplastic immunotherapy: Secondary | ICD-10-CM | POA: Diagnosis not present

## 2017-04-16 LAB — CBC WITH DIFFERENTIAL (CANCER CENTER ONLY)
Basophils Absolute: 0 10*3/uL (ref 0.0–0.1)
Basophils Relative: 1 %
EOS ABS: 0.1 10*3/uL (ref 0.0–0.5)
Eosinophils Relative: 3 %
HEMATOCRIT: 28.5 % — AB (ref 34.8–46.6)
HEMOGLOBIN: 9.3 g/dL — AB (ref 11.6–15.9)
LYMPHS ABS: 0.5 10*3/uL — AB (ref 0.9–3.3)
Lymphocytes Relative: 19 %
MCH: 27.8 pg (ref 25.1–34.0)
MCHC: 32.7 g/dL (ref 31.5–36.0)
MCV: 85.3 fL (ref 79.5–101.0)
MONOS PCT: 10 %
Monocytes Absolute: 0.2 10*3/uL (ref 0.1–0.9)
NEUTROS PCT: 67 %
Neutro Abs: 1.7 10*3/uL (ref 1.5–6.5)
Platelet Count: 109 10*3/uL — ABNORMAL LOW (ref 145–400)
RBC: 3.34 MIL/uL — ABNORMAL LOW (ref 3.70–5.45)
RDW: 17.6 % — ABNORMAL HIGH (ref 11.2–14.5)
WBC Count: 2.5 10*3/uL — ABNORMAL LOW (ref 3.9–10.3)

## 2017-04-16 LAB — CMP (CANCER CENTER ONLY)
ALBUMIN: 3.8 g/dL (ref 3.5–5.0)
ALT: 53 U/L (ref 0–55)
AST: 44 U/L — ABNORMAL HIGH (ref 5–34)
Alkaline Phosphatase: 122 U/L (ref 40–150)
Anion gap: 13 — ABNORMAL HIGH (ref 3–11)
BUN: 6 mg/dL — ABNORMAL LOW (ref 7–26)
CHLORIDE: 104 mmol/L (ref 98–109)
CO2: 24 mmol/L (ref 22–29)
CREATININE: 0.78 mg/dL (ref 0.60–1.10)
Calcium: 9.1 mg/dL (ref 8.4–10.4)
GFR, Estimated: >60 mL/min (ref >60)
Glucose, Bld: 120 mg/dL (ref 70–140)
Potassium: 3.3 mmol/L — ABNORMAL LOW (ref 3.5–5.1)
SODIUM: 141 mmol/L (ref 136–145)
Total Bilirubin: 0.7 mg/dL (ref 0.2–1.2)
Total Protein: 7 g/dL (ref 6.4–8.3)

## 2017-04-16 LAB — LACTATE DEHYDROGENASE: LDH: 163 U/L (ref 125–245)

## 2017-04-16 MED ORDER — BORTEZOMIB CHEMO SQ INJECTION 3.5 MG (2.5MG/ML)
1.3000 mg/m2 | Freq: Once | INTRAMUSCULAR | Status: AC
  Administered 2017-04-16: 3 mg via SUBCUTANEOUS
  Filled 2017-04-16: qty 3

## 2017-04-16 MED ORDER — ONDANSETRON HCL 8 MG PO TABS
8.0000 mg | ORAL_TABLET | Freq: Once | ORAL | Status: AC
  Administered 2017-04-16: 8 mg via ORAL

## 2017-04-16 MED ORDER — ONDANSETRON HCL 8 MG PO TABS
ORAL_TABLET | ORAL | Status: AC
  Filled 2017-04-16: qty 1

## 2017-04-16 NOTE — Patient Instructions (Signed)
[facility] Discharge Instructions for Patients Receiving Chemotherapy  Today you received the following chemotherapy agents velcade   To help prevent nausea and vomiting after your treatment, we encourage you to take your nausea medication as directed  If you develop nausea and vomiting that is not controlled by your nausea medication, call the clinic.   BELOW ARE SYMPTOMS THAT SHOULD BE REPORTED IMMEDIATELY:  *FEVER GREATER THAN 100.5 F  *CHILLS WITH OR WITHOUT FEVER  NAUSEA AND VOMITING THAT IS NOT CONTROLLED WITH YOUR NAUSEA MEDICATION  *UNUSUAL SHORTNESS OF BREATH  *UNUSUAL BRUISING OR BLEEDING  TENDERNESS IN MOUTH AND THROAT WITH OR WITHOUT PRESENCE OF ULCERS  *URINARY PROBLEMS  *BOWEL PROBLEMS  UNUSUAL RASH Items with * indicate a potential emergency and should be followed up as soon as possible.  Feel free to call the clinic you have any questions or concerns. The clinic phone number is [phone].  

## 2017-04-17 ENCOUNTER — Telehealth: Payer: Self-pay | Admitting: Obstetrics & Gynecology

## 2017-04-17 LAB — IGG, IGA, IGM
IgA: 78 mg/dL — ABNORMAL LOW (ref 87–352)
IgG (Immunoglobin G), Serum: 460 mg/dL — ABNORMAL LOW (ref 700–1600)
IgM (Immunoglobulin M), Srm: 720 mg/dL — ABNORMAL HIGH (ref 26–217)

## 2017-04-17 LAB — KAPPA/LAMBDA LIGHT CHAINS
KAPPA FREE LGHT CHN: 13 mg/L (ref 3.3–19.4)
Kappa, lambda light chain ratio: 0.02 — ABNORMAL LOW (ref 0.26–1.65)
Lambda free light chains: 588.8 mg/L — ABNORMAL HIGH (ref 5.7–26.3)

## 2017-04-17 LAB — BETA 2 MICROGLOBULIN, SERUM: Beta-2 Microglobulin: 1.9 mg/L (ref 0.6–2.4)

## 2017-04-17 NOTE — Telephone Encounter (Signed)
Left message regarding upcoming appointment has been canceled and needs to be rescheduled. °

## 2017-04-18 ENCOUNTER — Other Ambulatory Visit: Payer: Self-pay | Admitting: *Deleted

## 2017-04-18 DIAGNOSIS — C9000 Multiple myeloma not having achieved remission: Secondary | ICD-10-CM

## 2017-04-18 MED ORDER — REVLIMID 25 MG PO CAPS: ORAL_CAPSULE | ORAL | 0 refills | Status: DC

## 2017-04-18 NOTE — Telephone Encounter (Signed)
Rx for Revlimid and Auth# [id number] resent to pt Pharmacy

## 2017-04-22 ENCOUNTER — Ambulatory Visit: Payer: BLUE CROSS/BLUE SHIELD | Admitting: Internal Medicine

## 2017-04-22 ENCOUNTER — Other Ambulatory Visit: Payer: Self-pay | Admitting: Medical Oncology

## 2017-04-22 ENCOUNTER — Ambulatory Visit: Payer: BLUE CROSS/BLUE SHIELD

## 2017-04-22 ENCOUNTER — Other Ambulatory Visit: Payer: BLUE CROSS/BLUE SHIELD

## 2017-04-22 DIAGNOSIS — C9000 Multiple myeloma not having achieved remission: Secondary | ICD-10-CM

## 2017-04-23 ENCOUNTER — Inpatient Hospital Stay: Payer: BLUE CROSS/BLUE SHIELD

## 2017-04-23 ENCOUNTER — Telehealth: Payer: Self-pay | Admitting: Internal Medicine

## 2017-04-23 ENCOUNTER — Inpatient Hospital Stay (HOSPITAL_BASED_OUTPATIENT_CLINIC_OR_DEPARTMENT_OTHER): Payer: BLUE CROSS/BLUE SHIELD | Admitting: Internal Medicine

## 2017-04-23 ENCOUNTER — Encounter: Payer: Self-pay | Admitting: Medical Oncology

## 2017-04-23 ENCOUNTER — Encounter: Payer: Self-pay | Admitting: Internal Medicine

## 2017-04-23 ENCOUNTER — Other Ambulatory Visit: Payer: Self-pay | Admitting: Internal Medicine

## 2017-04-23 VITALS — BP 155/73 | HR 67 | Temp 97.7°F | Resp 20 | Ht 66.75 in | Wt 258.8 lb

## 2017-04-23 DIAGNOSIS — Z7984 Long term (current) use of oral hypoglycemic drugs: Secondary | ICD-10-CM | POA: Diagnosis not present

## 2017-04-23 DIAGNOSIS — I10 Essential (primary) hypertension: Secondary | ICD-10-CM

## 2017-04-23 DIAGNOSIS — Z794 Long term (current) use of insulin: Secondary | ICD-10-CM | POA: Diagnosis not present

## 2017-04-23 DIAGNOSIS — C9000 Multiple myeloma not having achieved remission: Secondary | ICD-10-CM

## 2017-04-23 DIAGNOSIS — R530 Neoplastic (malignant) related fatigue: Secondary | ICD-10-CM | POA: Diagnosis not present

## 2017-04-23 DIAGNOSIS — Z5112 Encounter for antineoplastic immunotherapy: Secondary | ICD-10-CM | POA: Diagnosis not present

## 2017-04-23 DIAGNOSIS — Z5111 Encounter for antineoplastic chemotherapy: Secondary | ICD-10-CM

## 2017-04-23 DIAGNOSIS — R0602 Shortness of breath: Secondary | ICD-10-CM

## 2017-04-23 DIAGNOSIS — Z79899 Other long term (current) drug therapy: Secondary | ICD-10-CM | POA: Diagnosis not present

## 2017-04-23 DIAGNOSIS — E119 Type 2 diabetes mellitus without complications: Secondary | ICD-10-CM | POA: Diagnosis not present

## 2017-04-23 LAB — CBC WITH DIFFERENTIAL (CANCER CENTER ONLY)
BASOS ABS: 0 10*3/uL (ref 0.0–0.1)
BASOS PCT: 1 %
EOS ABS: 0.2 10*3/uL (ref 0.0–0.5)
EOS PCT: 6 %
HCT: 29.3 % — ABNORMAL LOW (ref 34.8–46.6)
HEMOGLOBIN: 9.6 g/dL — AB (ref 11.6–15.9)
Lymphocytes Relative: 25 %
Lymphs Abs: 0.6 10*3/uL — ABNORMAL LOW (ref 0.9–3.3)
MCH: 27.9 pg (ref 25.1–34.0)
MCHC: 32.7 g/dL (ref 31.5–36.0)
MCV: 85.2 fL (ref 79.5–101.0)
Monocytes Absolute: 0.3 10*3/uL (ref 0.1–0.9)
Monocytes Relative: 12 %
NEUTROS PCT: 56 %
Neutro Abs: 1.4 10*3/uL — ABNORMAL LOW (ref 1.5–6.5)
PLATELETS: 111 10*3/uL — AB (ref 145–400)
RBC: 3.44 MIL/uL — ABNORMAL LOW (ref 3.70–5.45)
RDW: 18.2 % — ABNORMAL HIGH (ref 11.2–14.5)
WBC: 2.6 10*3/uL — AB (ref 3.9–10.3)

## 2017-04-23 LAB — CMP (CANCER CENTER ONLY)
ALBUMIN: 3.6 g/dL (ref 3.5–5.0)
ALT: 42 U/L (ref 0–55)
AST: 35 U/L — AB (ref 5–34)
Alkaline Phosphatase: 110 U/L (ref 40–150)
Anion gap: 11 (ref 3–11)
BUN: 7 mg/dL (ref 7–26)
CHLORIDE: 104 mmol/L (ref 98–109)
CO2: 24 mmol/L (ref 22–29)
CREATININE: 0.76 mg/dL (ref 0.60–1.10)
Calcium: 9.1 mg/dL (ref 8.4–10.4)
GFR, Estimated: >60 mL/min (ref >60)
GLUCOSE: 130 mg/dL (ref 70–140)
Potassium: 3.5 mmol/L (ref 3.5–5.1)
SODIUM: 139 mmol/L (ref 136–145)
Total Bilirubin: 0.6 mg/dL (ref 0.2–1.2)
Total Protein: 6.5 g/dL (ref 6.4–8.3)

## 2017-04-23 MED ORDER — BORTEZOMIB CHEMO SQ INJECTION 3.5 MG (2.5MG/ML)
1.3000 mg/m2 | Freq: Once | INTRAMUSCULAR | Status: AC
  Administered 2017-04-23: 3 mg via SUBCUTANEOUS
  Filled 2017-04-23: qty 3

## 2017-04-23 MED ORDER — ONDANSETRON HCL 8 MG PO TABS
8.0000 mg | ORAL_TABLET | Freq: Once | ORAL | Status: AC
  Administered 2017-04-23: 8 mg via ORAL

## 2017-04-23 MED ORDER — ONDANSETRON HCL 8 MG PO TABS
ORAL_TABLET | ORAL | Status: AC
  Filled 2017-04-23: qty 1

## 2017-04-23 MED ORDER — ACYCLOVIR 400 MG PO TABS: 400.0000 mg | ORAL_TABLET | Freq: Two times a day (BID) | ORAL | 0 refills | Status: DC

## 2017-04-23 NOTE — Patient Instructions (Signed)
[facility] Discharge Instructions for Patients Receiving Chemotherapy  Today you received the following chemotherapy agents Velcade.  To help prevent nausea and vomiting after your treatment, we encourage you to take your nausea medication as directed.  If you develop nausea and vomiting that is not controlled by your nausea medication, call the clinic.   BELOW ARE SYMPTOMS THAT SHOULD BE REPORTED IMMEDIATELY:  *FEVER GREATER THAN 100.5 F  *CHILLS WITH OR WITHOUT FEVER  NAUSEA AND VOMITING THAT IS NOT CONTROLLED WITH YOUR NAUSEA MEDICATION  *UNUSUAL SHORTNESS OF BREATH  *UNUSUAL BRUISING OR BLEEDING  TENDERNESS IN MOUTH AND THROAT WITH OR WITHOUT PRESENCE OF ULCERS  *URINARY PROBLEMS  *BOWEL PROBLEMS  UNUSUAL RASH Items with * indicate a potential emergency and should be followed up as soon as possible.  Feel free to call the clinic should you have any questions or concerns. The clinic phone number is [phone].  Please show the CHEMO ALERT CARD at check-in to the Emergency Department and triage nurse.   

## 2017-04-23 NOTE — Progress Notes (Signed)
Per Julien Nordmann it is okay to treat pt with Velcade and today's ANC.

## 2017-04-23 NOTE — Progress Notes (Signed)
[facility] [phone]   [phone]  OFFICE PROGRESS NOTE  [name], MD [place] [address]  DIAGNOSIS: Plasma cell dyscrasia initially diagnosed as MGUS in September 2010, with additional symptoms suggestive of POEMS syndrome.   PRIOR THERAPY:  1) Velcade 1.3 MG/M2 subcutaneously with Decadron 40 mg by mouth on a weekly basis. First cycle 11/24/2013. She status post 31 weekly doses of treatment. 2) Velcade 1.3 MG/M2 subcutaneously and weekly basis with Decadron 40 mg by mouth weekly. First dose 02/01/2015. Status post 28 cycles. 3) Revlimid 25 mg by mouth daily for 21 days every 4 weeks with weekly Decadron 20 mg. started in 11/27/2015. Status post 3 cycles discontinued secondary to lack of response.  CURRENT THERAPY: Systemic treatment with Velcade 1.3 MG/KG weekly, Revlimid 25 mg by mouth daily for 21 days every 4 weeks in addition to Decadron 20 mg by mouth weekly. First dose 03/06/2016. Status post 13 cycles.   INTERVAL HISTORY: Brenda Conner 57 y.o. female returns to the clinic today for follow-up visit.  The patient is feeling fine today with no specific complaints except for shortness of breath with exertion.  She denied having any significant chest pain, cough or hemoptysis.  She denied having any weight loss or night sweats.  She has no nausea, vomiting, diarrhea or constipation.  She has no fever or chills.  She denied having any bleeding issues.  The patient has been tolerating her treatment with Velcade, Revlimid and Decadron fairly well.  She had repeat myeloma panel performed recently and she is here for evaluation and discussion of her lab results.   MEDICAL HISTORY: Past Medical History:  Diagnosis Date  . Acid reflux   . Anxiety   . Asthma   . Cancer [facility])    waldenstroms/ macroglobinulemia  . Depression   . Depression   . Diabetes mellitus without complication ([place])   . Dysuria 02/28/2016  .  Hypercholesteremia   . Hypertension   . Hypothyroidism   . Macroglobulinemia ([place])    ? POEMS syndrome  . Multiple myeloma ([place])   . Obesity   . PONV (postoperative nausea and vomiting)   . Sleep apnea    CPAP at bedtime  . Sleep apnea     ALLERGIES:  is allergic to codeine; hydrocodone; lortab [hydrocodone-acetaminophen]; onion; shellfish allergy; and amoxicillin.  MEDICATIONS:  Current Outpatient Medications  Medication Sig Dispense Refill  . acyclovir (ZOVIRAX) 400 MG tablet Take 1 tablet (400 mg total) by mouth 2 (two) times daily. 180 tablet 0  . ALPRAZolam (XANAX) 0.5 MG tablet Take 1 tablet (0.5 mg total) by mouth at bedtime as needed for anxiety. 30 tablet 0  . Azelastine HCl 0.15 % SOLN as needed.    . B-D UF III MINI PEN NEEDLES 31G X 5 MM MISC     . Blood Glucose Monitoring Suppl (ONE TOUCH ULTRA MINI) W/DEVICE KIT See admin instructions. Reported on 01/25/2015  0  . Bortezomib (VELCADE IJ) Inject as directed once a week.    . Cetirizine HCl (ZYRTEC ALLERGY PO) Take by mouth daily.    [name] dexamethasone (DECADRON) 4 MG tablet 5 tablet by mouth every week. Start with the first dose of chemotherapy. 40 tablet 3  . DiphenhydrAMINE HCl (BENADRYL ALLERGY PO) Take by mouth as needed.    [name] EPIPEN 2-PAK 0.3 MG/0.3ML SOAJ injection Reported on 06/21/2015  1  . Fexofenadine HCl (ALLEGRA ALLERGY PO) Take by mouth  daily.    . Fluticasone-Salmeterol (ADVAIR) 100-50 MCG/DOSE AEPB Inhale 2 puffs into the lungs every 12 (twelve) hours.    [name] ibuprofen (ADVIL,MOTRIN) 100 MG tablet Take 100 mg by mouth every 6 (six) hours as needed. Reported on 05/31/2015    . insulin glargine (LANTUS) 100 UNIT/ML injection Inject 8 Units into the skin 2 (two) times a week. Reported on 02/18/2015    . levothyroxine (SYNTHROID, LEVOTHROID) 150 MCG tablet Take 150 mcg by mouth daily before breakfast.    . lisinopril (PRINIVIL,ZESTRIL) 10 MG tablet Take 10 mg by mouth daily.  3  . loperamide (IMODIUM) 2 MG capsule  Take 2 mg by mouth as needed for diarrhea or loose stools.    . meloxicam (MOBIC) 15 MG tablet Take 15 mg daily by mouth.  3  . metFORMIN (GLUCOPHAGE) 1000 MG tablet Take 1,000 mg by mouth 2 (two) times daily.  3  . NOVOLOG FLEXPEN 100 UNIT/ML FlexPen 3 Units daily as needed. Reported on 01/25/2015  6  . omeprazole (PRILOSEC) 40 MG capsule Take 40 mg by mouth 2 (two) times daily.     . ondansetron (ZOFRAN-ODT) 8 MG disintegrating tablet Take 1 tablet (8 mg total) by mouth every 8 (eight) hours as needed. Reported on 05/31/2015 20 tablet 2  . ONE TOUCH ULTRA TEST test strip See admin instructions. Reported on 01/25/2015  11  . pravastatin (PRAVACHOL) 40 MG tablet Take 40 mg by mouth every evening.     [name] PROAIR HFA 108 (90 Base) MCG/ACT inhaler Reported on 06/21/2015  1  . REVLIMID 25 MG capsule TAKE 1 CAPSULE BY MOUTH ONCE DAILY 21 DAYS ON AND 7 DAYS OFF 21 capsule 0  . sertraline (ZOLOFT) 50 MG tablet TAKE 3 TABLETS BY MOUTH EVERY DAY 270 tablet 3  . verapamil (CALAN-SR) 240 MG CR tablet Take 240 mg by mouth 2 (two) times daily.    [name] warfarin (COUMADIN) 2 MG tablet Take 1 tablet (2 mg total) by mouth daily. 90 tablet 0   No current facility-administered medications for this visit.     SURGICAL HISTORY:  Past Surgical History:  Procedure Laterality Date  . ABLATION  09/2007   HTA and polyp resection  . BACK SURGERY  03/2004   herniation, L4-L5  . Bil Laprascopic knee surgery    . BONE MARROW BIOPSY     2011  . BONE MARROW BIOPSY  4/14  . CHOLECYSTECTOMY    . FOOT SURGERY  1998  . KNEE ARTHROSCOPY W/ MENISCAL REPAIR  10/10 ; 3/11  . LAPAROSCOPIC CHOLECYSTECTOMY  1997  . NASAL SINUS SURGERY  2004  . UTERINE FIBROID EMBOLIZATION      REVIEW OF SYSTEMS:  Constitutional: positive for fatigue Eyes: negative Ears, nose, mouth, throat, and face: negative Respiratory: positive for dyspnea on exertion Cardiovascular: negative Gastrointestinal:  negative Genitourinary:negative Integument/breast: negative Hematologic/lymphatic: negative Musculoskeletal:negative Neurological: negative Behavioral/Psych: negative Endocrine: negative Allergic/Immunologic: negative   PHYSICAL EXAMINATION: General appearance: alert, cooperative, fatigued and no distress Head: Normocephalic, without obvious abnormality, atraumatic Neck: no adenopathy Lymph nodes: Cervical, supraclavicular, and axillary nodes normal. Resp: clear to auscultation bilaterally Back: symmetric, no curvature. ROM normal. No CVA tenderness. Cardio: regular rate and rhythm, S1, S2 normal, no murmur, click, rub or gallop GI: soft, non-tender; bowel sounds normal; no masses,  no organomegaly Extremities: extremities normal, atraumatic, no cyanosis or edema Neurologic: Alert and oriented X 3, normal strength and tone. Normal symmetric reflexes. Normal coordination and gait  ECOG PERFORMANCE STATUS: 1 -  Symptomatic but completely ambulatory  Blood pressure (!) 155/73, pulse 67, temperature 97.7 F (36.5 C), temperature source Oral, resp. rate 20, height 5' 6.75" (1.695 m), weight 258 lb 12.8 oz (117.4 kg), SpO2 100 %.  LABORATORY DATA: Lab Results  Component Value Date   WBC 2.6 (L) 04/23/2017   HGB 10.6 (L) 02/12/2017   HCT 29.3 (L) 04/23/2017   MCV 85.2 04/23/2017   PLT 111 (L) 04/23/2017      Chemistry      Component Value Date/Time   NA 141 04/16/2017 0820   NA 138 11/14/2016 0824   K 3.3 (L) 04/16/2017 0820   K 4.3 11/14/2016 0824   CL 104 04/16/2017 0820   CL 104 04/07/2012 1415   CO2 24 04/16/2017 0820   CO2 23 11/14/2016 0824   BUN 6 (L) 04/16/2017 0820   BUN 9.1 11/14/2016 0824   CREATININE 0.78 04/16/2017 0820   CREATININE 0.7 11/14/2016 0824      Component Value Date/Time   CALCIUM 9.1 04/16/2017 0820   CALCIUM 9.6 11/14/2016 0824   ALKPHOS 122 04/16/2017 0820   ALKPHOS 94 11/14/2016 0824   AST 44 (H) 04/16/2017 0820   AST 28 11/14/2016 0824    ALT 53 04/16/2017 0820   ALT 24 11/14/2016 0824   BILITOT 0.7 04/16/2017 0820   BILITOT 0.41 11/14/2016 0824      ASSESSMENT AND PLAN:  This is a very pleasant 57 years old white female with multiple myeloma status post several chemotherapy regimens. The patient has been on treatment with subcutaneous weekly Velcade as well as Revlimid and DecadronStatus post 13 cycles.  She continues to tolerate her treatment well with no concerning complaints except for fatigue and shortness of breath with exertion secondary to her anemia.  The patient had repeat myeloma panel performed recently.  I discussed the results with the patient today.  Her myeloma panel showed no concerning findings for progression. I recommended for the patient to continue her current treatment with Velcade, Revlimid and Decadron with cycle #14 as a scheduled She will come back for follow-up visit in 4 weeks for evaluation. I will refill acyclovir today. For hypertension, I advised the patient to monitor her blood pressure closely. The patient was advised to call immediately if she has any concerning symptoms in the interval. All questions were answered. The patient knows to call the clinic with any problems, questions or concerns. We can certainly see the patient much sooner if necessary.  Disclaimer: This note was dictated with voice recognition software. Similar sounding words can inadvertently be transcribed and may not be corrected upon review.

## 2017-04-23 NOTE — Telephone Encounter (Signed)
Scheduled appt per 4/16 los -patient to get an updated schedule in the treatment area.  

## 2017-04-29 ENCOUNTER — Ambulatory Visit: Payer: BLUE CROSS/BLUE SHIELD

## 2017-04-29 ENCOUNTER — Other Ambulatory Visit: Payer: BLUE CROSS/BLUE SHIELD

## 2017-04-29 ENCOUNTER — Other Ambulatory Visit: Payer: Self-pay | Admitting: Medical Oncology

## 2017-04-29 DIAGNOSIS — C9000 Multiple myeloma not having achieved remission: Secondary | ICD-10-CM

## 2017-04-30 ENCOUNTER — Inpatient Hospital Stay: Payer: BLUE CROSS/BLUE SHIELD

## 2017-04-30 VITALS — BP 128/66 | HR 58 | Temp 98.2°F | Resp 17

## 2017-04-30 DIAGNOSIS — C9000 Multiple myeloma not having achieved remission: Secondary | ICD-10-CM

## 2017-04-30 DIAGNOSIS — R0602 Shortness of breath: Secondary | ICD-10-CM | POA: Diagnosis not present

## 2017-04-30 DIAGNOSIS — Z7984 Long term (current) use of oral hypoglycemic drugs: Secondary | ICD-10-CM | POA: Diagnosis not present

## 2017-04-30 DIAGNOSIS — Z5112 Encounter for antineoplastic immunotherapy: Secondary | ICD-10-CM | POA: Diagnosis not present

## 2017-04-30 DIAGNOSIS — R530 Neoplastic (malignant) related fatigue: Secondary | ICD-10-CM | POA: Diagnosis not present

## 2017-04-30 DIAGNOSIS — I10 Essential (primary) hypertension: Secondary | ICD-10-CM | POA: Diagnosis not present

## 2017-04-30 DIAGNOSIS — Z794 Long term (current) use of insulin: Secondary | ICD-10-CM | POA: Diagnosis not present

## 2017-04-30 DIAGNOSIS — Z79899 Other long term (current) drug therapy: Secondary | ICD-10-CM | POA: Diagnosis not present

## 2017-04-30 DIAGNOSIS — E119 Type 2 diabetes mellitus without complications: Secondary | ICD-10-CM | POA: Diagnosis not present

## 2017-04-30 LAB — CBC WITH DIFFERENTIAL (CANCER CENTER ONLY)
BASOS ABS: 0.1 10*3/uL (ref 0.0–0.1)
Basophils Relative: 3 %
Eosinophils Absolute: 0.1 10*3/uL (ref 0.0–0.5)
Eosinophils Relative: 2 %
HEMATOCRIT: 29.5 % — AB (ref 34.8–46.6)
HEMOGLOBIN: 9.3 g/dL — AB (ref 11.6–15.9)
LYMPHS PCT: 24 %
Lymphs Abs: 0.8 10*3/uL — ABNORMAL LOW (ref 0.9–3.3)
MCH: 28.1 pg (ref 25.1–34.0)
MCHC: 31.5 g/dL (ref 31.5–36.0)
MCV: 89.1 fL (ref 79.5–101.0)
MONO ABS: 0.3 10*3/uL (ref 0.1–0.9)
MONOS PCT: 11 %
NEUTROS ABS: 1.9 10*3/uL (ref 1.5–6.5)
NEUTROS PCT: 60 %
Platelet Count: 109 10*3/uL — ABNORMAL LOW (ref 145–400)
RBC: 3.31 MIL/uL — ABNORMAL LOW (ref 3.70–5.45)
RDW: 16.8 % — AB (ref 11.2–14.5)
WBC Count: 3.1 10*3/uL — ABNORMAL LOW (ref 3.9–10.3)

## 2017-04-30 LAB — CMP (CANCER CENTER ONLY)
ALBUMIN: 3.7 g/dL (ref 3.5–5.0)
ALK PHOS: 139 U/L (ref 40–150)
ALT: 45 U/L (ref 0–55)
AST: 40 U/L — AB (ref 5–34)
Anion gap: 11 (ref 3–11)
BILIRUBIN TOTAL: 0.4 mg/dL (ref 0.2–1.2)
BUN: 8 mg/dL (ref 7–26)
CALCIUM: 9.2 mg/dL (ref 8.4–10.4)
CO2: 21 mmol/L — ABNORMAL LOW (ref 22–29)
Chloride: 106 mmol/L (ref 98–109)
Creatinine: 0.77 mg/dL (ref 0.60–1.10)
GFR, Est AFR Am: >60 mL/min (ref >60)
GLUCOSE: 139 mg/dL (ref 70–140)
POTASSIUM: 4 mmol/L (ref 3.5–5.1)
Sodium: 138 mmol/L (ref 136–145)
TOTAL PROTEIN: 6.5 g/dL (ref 6.4–8.3)

## 2017-04-30 MED ORDER — ONDANSETRON HCL 8 MG PO TABS
ORAL_TABLET | ORAL | Status: AC
Start: 2017-04-30 — End: 2017-04-30
  Filled 2017-04-30: qty 1

## 2017-04-30 MED ORDER — BORTEZOMIB CHEMO SQ INJECTION 3.5 MG (2.5MG/ML)
1.3000 mg/m2 | Freq: Once | INTRAMUSCULAR | Status: AC
  Administered 2017-04-30: 3 mg via SUBCUTANEOUS
  Filled 2017-04-30: qty 3

## 2017-04-30 MED ORDER — ONDANSETRON HCL 8 MG PO TABS
8.0000 mg | ORAL_TABLET | Freq: Once | ORAL | Status: AC
  Administered 2017-04-30: 8 mg via ORAL

## 2017-04-30 NOTE — Patient Instructions (Signed)
[facility] Discharge Instructions for Patients Receiving Chemotherapy  Today you received the following chemotherapy agents Velcade To help prevent nausea and vomiting after your treatment, we encourage you to take your nausea medication as prescribed.   If you develop nausea and vomiting that is not controlled by your nausea medication, call the clinic.   BELOW ARE SYMPTOMS THAT SHOULD BE REPORTED IMMEDIATELY:  *FEVER GREATER THAN 100.5 F  *CHILLS WITH OR WITHOUT FEVER  NAUSEA AND VOMITING THAT IS NOT CONTROLLED WITH YOUR NAUSEA MEDICATION  *UNUSUAL SHORTNESS OF BREATH  *UNUSUAL BRUISING OR BLEEDING  TENDERNESS IN MOUTH AND THROAT WITH OR WITHOUT PRESENCE OF ULCERS  *URINARY PROBLEMS  *BOWEL PROBLEMS  UNUSUAL RASH Items with * indicate a potential emergency and should be followed up as soon as possible.  Feel free to call the clinic should you have any questions or concerns. The clinic phone number is [phone].  Please show the CHEMO ALERT CARD at check-in to the Emergency Department and triage nurse.   

## 2017-05-06 ENCOUNTER — Other Ambulatory Visit: Payer: Self-pay | Admitting: *Deleted

## 2017-05-06 DIAGNOSIS — C9000 Multiple myeloma not having achieved remission: Secondary | ICD-10-CM

## 2017-05-06 NOTE — Progress Notes (Signed)
Orders entered

## 2017-05-07 ENCOUNTER — Inpatient Hospital Stay: Payer: BLUE CROSS/BLUE SHIELD

## 2017-05-07 VITALS — BP 136/76 | HR 66 | Temp 98.2°F | Resp 18 | Wt 258.0 lb

## 2017-05-07 DIAGNOSIS — R0602 Shortness of breath: Secondary | ICD-10-CM | POA: Diagnosis not present

## 2017-05-07 DIAGNOSIS — C9000 Multiple myeloma not having achieved remission: Secondary | ICD-10-CM | POA: Diagnosis not present

## 2017-05-07 DIAGNOSIS — E119 Type 2 diabetes mellitus without complications: Secondary | ICD-10-CM | POA: Diagnosis not present

## 2017-05-07 DIAGNOSIS — Z794 Long term (current) use of insulin: Secondary | ICD-10-CM | POA: Diagnosis not present

## 2017-05-07 DIAGNOSIS — Z79899 Other long term (current) drug therapy: Secondary | ICD-10-CM | POA: Diagnosis not present

## 2017-05-07 DIAGNOSIS — R530 Neoplastic (malignant) related fatigue: Secondary | ICD-10-CM | POA: Diagnosis not present

## 2017-05-07 DIAGNOSIS — I10 Essential (primary) hypertension: Secondary | ICD-10-CM | POA: Diagnosis not present

## 2017-05-07 DIAGNOSIS — Z5112 Encounter for antineoplastic immunotherapy: Secondary | ICD-10-CM | POA: Diagnosis not present

## 2017-05-07 DIAGNOSIS — Z7984 Long term (current) use of oral hypoglycemic drugs: Secondary | ICD-10-CM | POA: Diagnosis not present

## 2017-05-07 LAB — CMP (CANCER CENTER ONLY)
ALK PHOS: 112 U/L (ref 40–150)
ALT: 36 U/L (ref 0–55)
AST: 33 U/L (ref 5–34)
Albumin: 4 g/dL (ref 3.5–5.0)
Anion gap: 11 (ref 3–11)
BILIRUBIN TOTAL: 0.6 mg/dL (ref 0.2–1.2)
BUN: 7 mg/dL (ref 7–26)
CALCIUM: 9.7 mg/dL (ref 8.4–10.4)
CO2: 23 mmol/L (ref 22–29)
CREATININE: 0.78 mg/dL (ref 0.60–1.10)
Chloride: 104 mmol/L (ref 98–109)
GFR, Est AFR Am: >60 mL/min (ref >60)
Glucose, Bld: 129 mg/dL (ref 70–140)
Potassium: 4.1 mmol/L (ref 3.5–5.1)
Sodium: 138 mmol/L (ref 136–145)
Total Protein: 6.9 g/dL (ref 6.4–8.3)

## 2017-05-07 LAB — CBC WITH DIFFERENTIAL (CANCER CENTER ONLY)
BASOS PCT: 1 %
Basophils Absolute: 0 10*3/uL (ref 0.0–0.1)
EOS ABS: 0.1 10*3/uL (ref 0.0–0.5)
EOS PCT: 4 %
HCT: 32 % — ABNORMAL LOW (ref 34.8–46.6)
HEMOGLOBIN: 10 g/dL — AB (ref 11.6–15.9)
Lymphocytes Relative: 21 %
Lymphs Abs: 0.7 10*3/uL — ABNORMAL LOW (ref 0.9–3.3)
MCH: 27.7 pg (ref 25.1–34.0)
MCHC: 31.3 g/dL — AB (ref 31.5–36.0)
MCV: 88.6 fL (ref 79.5–101.0)
Monocytes Absolute: 0.2 10*3/uL (ref 0.1–0.9)
Monocytes Relative: 7 %
Neutro Abs: 2.2 10*3/uL (ref 1.5–6.5)
Neutrophils Relative %: 67 %
PLATELETS: 133 10*3/uL — AB (ref 145–400)
RBC: 3.61 MIL/uL — AB (ref 3.70–5.45)
RDW: 16.7 % — ABNORMAL HIGH (ref 11.2–14.5)
WBC Count: 3.3 10*3/uL — ABNORMAL LOW (ref 3.9–10.3)

## 2017-05-07 MED ORDER — BORTEZOMIB CHEMO SQ INJECTION 3.5 MG (2.5MG/ML)
1.3000 mg/m2 | Freq: Once | INTRAMUSCULAR | Status: AC
  Administered 2017-05-07: 3 mg via SUBCUTANEOUS
  Filled 2017-05-07: qty 3

## 2017-05-07 MED ORDER — ONDANSETRON HCL 8 MG PO TABS
ORAL_TABLET | ORAL | Status: AC
  Filled 2017-05-07: qty 1

## 2017-05-07 MED ORDER — ONDANSETRON HCL 8 MG PO TABS
8.0000 mg | ORAL_TABLET | Freq: Once | ORAL | Status: AC
  Administered 2017-05-07: 8 mg via ORAL

## 2017-05-07 NOTE — Patient Instructions (Signed)
[facility] Discharge Instructions for Patients Receiving Chemotherapy  Today you received the following chemotherapy agents Velcade To help prevent nausea and vomiting after your treatment, we encourage you to take your nausea medication as prescribed.   If you develop nausea and vomiting that is not controlled by your nausea medication, call the clinic.   BELOW ARE SYMPTOMS THAT SHOULD BE REPORTED IMMEDIATELY:  *FEVER GREATER THAN 100.5 F  *CHILLS WITH OR WITHOUT FEVER  NAUSEA AND VOMITING THAT IS NOT CONTROLLED WITH YOUR NAUSEA MEDICATION  *UNUSUAL SHORTNESS OF BREATH  *UNUSUAL BRUISING OR BLEEDING  TENDERNESS IN MOUTH AND THROAT WITH OR WITHOUT PRESENCE OF ULCERS  *URINARY PROBLEMS  *BOWEL PROBLEMS  UNUSUAL RASH Items with * indicate a potential emergency and should be followed up as soon as possible.  Feel free to call the clinic should you have any questions or concerns. The clinic phone number is [phone].  Please show the CHEMO ALERT CARD at check-in to the Emergency Department and triage nurse.   

## 2017-05-13 ENCOUNTER — Other Ambulatory Visit: Payer: Self-pay | Admitting: Internal Medicine

## 2017-05-13 DIAGNOSIS — C9000 Multiple myeloma not having achieved remission: Secondary | ICD-10-CM

## 2017-05-14 ENCOUNTER — Inpatient Hospital Stay: Payer: BLUE CROSS/BLUE SHIELD

## 2017-05-14 ENCOUNTER — Inpatient Hospital Stay: Payer: BLUE CROSS/BLUE SHIELD | Attending: Internal Medicine

## 2017-05-14 ENCOUNTER — Other Ambulatory Visit: Payer: Self-pay | Admitting: *Deleted

## 2017-05-14 VITALS — BP 122/65 | HR 62 | Temp 98.6°F | Resp 16

## 2017-05-14 DIAGNOSIS — Z79899 Other long term (current) drug therapy: Secondary | ICD-10-CM | POA: Insufficient documentation

## 2017-05-14 DIAGNOSIS — Z794 Long term (current) use of insulin: Secondary | ICD-10-CM | POA: Insufficient documentation

## 2017-05-14 DIAGNOSIS — R197 Diarrhea, unspecified: Secondary | ICD-10-CM | POA: Insufficient documentation

## 2017-05-14 DIAGNOSIS — Z7984 Long term (current) use of oral hypoglycemic drugs: Secondary | ICD-10-CM | POA: Insufficient documentation

## 2017-05-14 DIAGNOSIS — E119 Type 2 diabetes mellitus without complications: Secondary | ICD-10-CM | POA: Diagnosis not present

## 2017-05-14 DIAGNOSIS — C9000 Multiple myeloma not having achieved remission: Secondary | ICD-10-CM

## 2017-05-14 DIAGNOSIS — Z5112 Encounter for antineoplastic immunotherapy: Secondary | ICD-10-CM | POA: Diagnosis not present

## 2017-05-14 DIAGNOSIS — R530 Neoplastic (malignant) related fatigue: Secondary | ICD-10-CM | POA: Diagnosis not present

## 2017-05-14 LAB — CMP (CANCER CENTER ONLY)
ALT: 43 U/L (ref 0–55)
AST: 40 U/L — AB (ref 5–34)
Albumin: 3.8 g/dL (ref 3.5–5.0)
Alkaline Phosphatase: 119 U/L (ref 40–150)
Anion gap: 10 (ref 3–11)
BILIRUBIN TOTAL: 0.4 mg/dL (ref 0.2–1.2)
BUN: 7 mg/dL (ref 7–26)
CO2: 25 mmol/L (ref 22–29)
CREATININE: 0.75 mg/dL (ref 0.60–1.10)
Calcium: 9 mg/dL (ref 8.4–10.4)
Chloride: 104 mmol/L (ref 98–109)
GFR, Est AFR Am: >60 mL/min (ref >60)
Glucose, Bld: 129 mg/dL (ref 70–140)
Potassium: 3.4 mmol/L — ABNORMAL LOW (ref 3.5–5.1)
Sodium: 139 mmol/L (ref 136–145)
TOTAL PROTEIN: 6.6 g/dL (ref 6.4–8.3)

## 2017-05-14 LAB — CBC WITH DIFFERENTIAL (CANCER CENTER ONLY)
Basophils Absolute: 0 10*3/uL (ref 0.0–0.1)
Basophils Relative: 1 %
Eosinophils Absolute: 0.1 10*3/uL (ref 0.0–0.5)
Eosinophils Relative: 4 %
HEMATOCRIT: 29.8 % — AB (ref 34.8–46.6)
Hemoglobin: 9.6 g/dL — ABNORMAL LOW (ref 11.6–15.9)
LYMPHS PCT: 19 %
Lymphs Abs: 0.6 10*3/uL — ABNORMAL LOW (ref 0.9–3.3)
MCH: 27.9 pg (ref 25.1–34.0)
MCHC: 32.3 g/dL (ref 31.5–36.0)
MCV: 86.3 fL (ref 79.5–101.0)
MONO ABS: 0.4 10*3/uL (ref 0.1–0.9)
Monocytes Relative: 12 %
NEUTROS ABS: 2 10*3/uL (ref 1.5–6.5)
Neutrophils Relative %: 64 %
Platelet Count: 113 10*3/uL — ABNORMAL LOW (ref 145–400)
RBC: 3.45 MIL/uL — AB (ref 3.70–5.45)
RDW: 18.4 % — AB (ref 11.2–14.5)
WBC Count: 3.2 10*3/uL — ABNORMAL LOW (ref 3.9–10.3)

## 2017-05-14 MED ORDER — ONDANSETRON HCL 8 MG PO TABS
8.0000 mg | ORAL_TABLET | Freq: Once | ORAL | Status: AC
  Administered 2017-05-14: 8 mg via ORAL

## 2017-05-14 MED ORDER — ONDANSETRON HCL 8 MG PO TABS
ORAL_TABLET | ORAL | Status: AC
  Filled 2017-05-14: qty 1

## 2017-05-14 MED ORDER — REVLIMID 25 MG PO CAPS: ORAL_CAPSULE | ORAL | 0 refills | Status: DC

## 2017-05-14 MED ORDER — BORTEZOMIB CHEMO SQ INJECTION 3.5 MG (2.5MG/ML)
1.3000 mg/m2 | Freq: Once | INTRAMUSCULAR | Status: AC
  Administered 2017-05-14: 3 mg via SUBCUTANEOUS
  Filled 2017-05-14: qty 3

## 2017-05-14 NOTE — Patient Instructions (Signed)
[facility] Discharge Instructions for Patients Receiving Chemotherapy  Today you received the following chemotherapy agents Velcade.  To help prevent nausea and vomiting after your treatment, we encourage you to take your nausea medication as directed.  If you develop nausea and vomiting that is not controlled by your nausea medication, call the clinic.   BELOW ARE SYMPTOMS THAT SHOULD BE REPORTED IMMEDIATELY:  *FEVER GREATER THAN 100.5 F  *CHILLS WITH OR WITHOUT FEVER  NAUSEA AND VOMITING THAT IS NOT CONTROLLED WITH YOUR NAUSEA MEDICATION  *UNUSUAL SHORTNESS OF BREATH  *UNUSUAL BRUISING OR BLEEDING  TENDERNESS IN MOUTH AND THROAT WITH OR WITHOUT PRESENCE OF ULCERS  *URINARY PROBLEMS  *BOWEL PROBLEMS  UNUSUAL RASH Items with * indicate a potential emergency and should be followed up as soon as possible.  Feel free to call the clinic should you have any questions or concerns. The clinic phone number is [phone].  Please show the CHEMO ALERT CARD at check-in to the Emergency Department and triage nurse.   

## 2017-05-17 ENCOUNTER — Other Ambulatory Visit: Payer: Self-pay | Admitting: Internal Medicine

## 2017-05-17 DIAGNOSIS — C9000 Multiple myeloma not having achieved remission: Secondary | ICD-10-CM

## 2017-05-20 ENCOUNTER — Other Ambulatory Visit: Payer: Self-pay | Admitting: Medical Oncology

## 2017-05-20 DIAGNOSIS — C9000 Multiple myeloma not having achieved remission: Secondary | ICD-10-CM

## 2017-05-21 ENCOUNTER — Inpatient Hospital Stay (HOSPITAL_BASED_OUTPATIENT_CLINIC_OR_DEPARTMENT_OTHER): Payer: BLUE CROSS/BLUE SHIELD | Admitting: Internal Medicine

## 2017-05-21 ENCOUNTER — Inpatient Hospital Stay: Payer: BLUE CROSS/BLUE SHIELD

## 2017-05-21 ENCOUNTER — Encounter: Payer: Self-pay | Admitting: Internal Medicine

## 2017-05-21 VITALS — BP 137/59 | HR 97 | Temp 98.4°F | Resp 18 | Ht 66.75 in | Wt 259.2 lb

## 2017-05-21 DIAGNOSIS — Z7984 Long term (current) use of oral hypoglycemic drugs: Secondary | ICD-10-CM

## 2017-05-21 DIAGNOSIS — C9000 Multiple myeloma not having achieved remission: Secondary | ICD-10-CM

## 2017-05-21 DIAGNOSIS — Z5112 Encounter for antineoplastic immunotherapy: Secondary | ICD-10-CM | POA: Diagnosis not present

## 2017-05-21 DIAGNOSIS — Z794 Long term (current) use of insulin: Secondary | ICD-10-CM | POA: Diagnosis not present

## 2017-05-21 DIAGNOSIS — R530 Neoplastic (malignant) related fatigue: Secondary | ICD-10-CM

## 2017-05-21 DIAGNOSIS — R197 Diarrhea, unspecified: Secondary | ICD-10-CM

## 2017-05-21 DIAGNOSIS — Z79899 Other long term (current) drug therapy: Secondary | ICD-10-CM | POA: Diagnosis not present

## 2017-05-21 DIAGNOSIS — E119 Type 2 diabetes mellitus without complications: Secondary | ICD-10-CM

## 2017-05-21 DIAGNOSIS — Z5111 Encounter for antineoplastic chemotherapy: Secondary | ICD-10-CM

## 2017-05-21 LAB — CMP (CANCER CENTER ONLY)
ALBUMIN: 3.9 g/dL (ref 3.5–5.0)
ALT: 44 U/L (ref 0–55)
ANION GAP: 11 (ref 3–11)
AST: 34 U/L (ref 5–34)
Alkaline Phosphatase: 104 U/L (ref 40–150)
BILIRUBIN TOTAL: 0.7 mg/dL (ref 0.2–1.2)
BUN: 9 mg/dL (ref 7–26)
CO2: 23 mmol/L (ref 22–29)
Calcium: 9.3 mg/dL (ref 8.4–10.4)
Chloride: 105 mmol/L (ref 98–109)
Creatinine: 0.76 mg/dL (ref 0.60–1.10)
GLUCOSE: 137 mg/dL (ref 70–140)
POTASSIUM: 3.6 mmol/L (ref 3.5–5.1)
Sodium: 139 mmol/L (ref 136–145)
TOTAL PROTEIN: 6.9 g/dL (ref 6.4–8.3)

## 2017-05-21 LAB — CBC WITH DIFFERENTIAL (CANCER CENTER ONLY)
BASOS ABS: 0 10*3/uL (ref 0.0–0.1)
Basophils Relative: 0 %
Eosinophils Absolute: 0.2 10*3/uL (ref 0.0–0.5)
Eosinophils Relative: 6 %
HEMATOCRIT: 31.9 % — AB (ref 34.8–46.6)
HEMOGLOBIN: 9.9 g/dL — AB (ref 11.6–15.9)
LYMPHS PCT: 25 %
Lymphs Abs: 0.7 10*3/uL — ABNORMAL LOW (ref 0.9–3.3)
MCH: 27.9 pg (ref 25.1–34.0)
MCHC: 31 g/dL — ABNORMAL LOW (ref 31.5–36.0)
MCV: 89.9 fL (ref 79.5–101.0)
Monocytes Absolute: 0.3 10*3/uL (ref 0.1–0.9)
Monocytes Relative: 10 %
NEUTROS ABS: 1.6 10*3/uL (ref 1.5–6.5)
NEUTROS PCT: 59 %
Platelet Count: 106 10*3/uL — ABNORMAL LOW (ref 145–400)
RBC: 3.55 MIL/uL — ABNORMAL LOW (ref 3.70–5.45)
RDW: 17.2 % — ABNORMAL HIGH (ref 11.2–14.5)
WBC: 2.7 10*3/uL — AB (ref 3.9–10.3)

## 2017-05-21 MED ORDER — ONDANSETRON HCL 8 MG PO TABS
ORAL_TABLET | ORAL | Status: AC
Start: 2017-05-21 — End: ?
  Filled 2017-05-21: qty 1

## 2017-05-21 MED ORDER — BORTEZOMIB CHEMO SQ INJECTION 3.5 MG (2.5MG/ML)
1.3000 mg/m2 | Freq: Once | INTRAMUSCULAR | Status: AC
  Administered 2017-05-21: 3 mg via SUBCUTANEOUS
  Filled 2017-05-21: qty 3

## 2017-05-21 MED ORDER — ONDANSETRON HCL 8 MG PO TABS
8.0000 mg | ORAL_TABLET | Freq: Once | ORAL | Status: AC
  Administered 2017-05-21: 8 mg via ORAL

## 2017-05-21 NOTE — Progress Notes (Signed)
.mop       [facility] [phone]   [phone]  OFFICE PROGRESS NOTE  Keever, Richard A, MD [address] [place] [address]  DIAGNOSIS: Plasma cell dyscrasia initially diagnosed as MGUS in September 2010, with additional symptoms suggestive of POEMS syndrome.   PRIOR THERAPY:  1) Velcade 1.3 MG/M2 subcutaneously with Decadron 40 mg by mouth on a weekly basis. First cycle 11/24/2013. She status post 31 weekly doses of treatment. 2) Velcade 1.3 MG/M2 subcutaneously and weekly basis with Decadron 40 mg by mouth weekly. First dose 02/01/2015. Status post 28 cycles. 3) Revlimid 25 mg by mouth daily for 21 days every 4 weeks with weekly Decadron 20 mg. started in 11/27/2015. Status post 3 cycles discontinued secondary to lack of response.  CURRENT THERAPY: Systemic treatment with Velcade 1.3 MG/KG weekly, Revlimid 25 mg by mouth daily for 21 days every 4 weeks in addition to Decadron 20 mg by mouth weekly. First dose 03/06/2016. Status post 14 cycles.   INTERVAL HISTORY: Brenda Conner 57 y.o. female returns to the visit today for follow-up visit.  The patient continues to tolerate her treatment with Velcade, Revlimid and Decadron fairly well except for fatigue.  She has few episodes of diarrhea but not on daily basis.  She is Imodium for diarrhea.  She denied having any current chest pain but has shortness of breath with exertion with no cough or hemoptysis.  She has no significant weight loss or night sweats.  She has no fever or chills.  She is here today for evaluation and repeat blood work before starting cycle #15.   MEDICAL HISTORY: Past Medical History:  Diagnosis Date  . Acid reflux   . Anxiety   . Asthma   . Cancer (HCC)    waldenstroms/ macroglobinulemia  . Depression   . Depression   . Diabetes mellitus without complication (HCC)   . Dysuria 02/28/2016  . Hypercholesteremia   . Hypertension   . Hypothyroidism   . Macroglobulinemia  (HCC)    ? POEMS syndrome  . Multiple myeloma (HCC)   . Obesity   . PONV (postoperative nausea and vomiting)   . Sleep apnea    CPAP at bedtime  . Sleep apnea     ALLERGIES:  is allergic to codeine; hydrocodone; lortab [hydrocodone-acetaminophen]; onion; shellfish allergy; and amoxicillin.  MEDICATIONS:  Current Outpatient Medications  Medication Sig Dispense Refill  . acyclovir (ZOVIRAX) 400 MG tablet Take 1 tablet (400 mg total) by mouth 2 (two) times daily. 180 tablet 0  . ALPRAZolam (XANAX) 0.5 MG tablet Take 1 tablet (0.5 mg total) by mouth at bedtime as needed for anxiety. 30 tablet 0  . Azelastine HCl 0.15 % SOLN as needed.    . B-D UF III MINI PEN NEEDLES 31G X 5 MM MISC     . Blood Glucose Monitoring Suppl (ONE TOUCH ULTRA MINI) W/DEVICE KIT See admin instructions. Reported on 01/25/2015  0  . Bortezomib (VELCADE IJ) Inject as directed once a week.    . Cetirizine HCl (ZYRTEC ALLERGY PO) Take by mouth daily.    . dexamethasone (DECADRON) 4 MG tablet 5 tablet by mouth every week. Start with the first dose of chemotherapy. 40 tablet 3  . DiphenhydrAMINE HCl (BENADRYL ALLERGY PO) Take by mouth as needed.    . Fexofenadine HCl (ALLEGRA ALLERGY PO) Take by mouth daily.    . Fluticasone-Salmeterol (ADVAIR) 100-50 MCG/DOSE AEPB Inhale 2 puffs into the lungs every 12 (twelve)   hours.    . ibuprofen (ADVIL,MOTRIN) 100 MG tablet Take 100 mg by mouth every 6 (six) hours as needed. Reported on 05/31/2015    . insulin glargine (LANTUS) 100 UNIT/ML injection Inject 8 Units into the skin 2 (two) times a week. Reported on 02/18/2015    . levothyroxine (SYNTHROID, LEVOTHROID) 150 MCG tablet Take 150 mcg by mouth daily before breakfast.    . lisinopril (PRINIVIL,ZESTRIL) 10 MG tablet Take 10 mg by mouth daily.  3  . loperamide (IMODIUM) 2 MG capsule Take 2 mg by mouth as needed for diarrhea or loose stools.    . meloxicam (MOBIC) 15 MG tablet Take 15 mg daily by mouth.  3  . metFORMIN (GLUCOPHAGE)  1000 MG tablet Take 1,000 mg by mouth 2 (two) times daily.  3  . NOVOLOG FLEXPEN 100 UNIT/ML FlexPen 3 Units daily as needed. Reported on 01/25/2015  6  . omeprazole (PRILOSEC) 40 MG capsule Take 40 mg by mouth 2 (two) times daily.     . ondansetron (ZOFRAN-ODT) 8 MG disintegrating tablet Take 1 tablet (8 mg total) by mouth every 8 (eight) hours as needed. Reported on 05/31/2015 20 tablet 2  . ONE TOUCH ULTRA TEST test strip See admin instructions. Reported on 01/25/2015  11  . pravastatin (PRAVACHOL) 40 MG tablet Take 40 mg by mouth every evening.     . PROAIR HFA 108 (90 Base) MCG/ACT inhaler Reported on 06/21/2015  1  . REVLIMID 25 MG capsule 05/14/17-Authorization# [id number] 21 capsule 0  . sertraline (ZOLOFT) 50 MG tablet TAKE 3 TABLETS BY MOUTH EVERY DAY 270 tablet 3  . verapamil (CALAN-SR) 240 MG CR tablet Take 240 mg by mouth 2 (two) times daily.    . warfarin (COUMADIN) 2 MG tablet TAKE 1 TABLET BY MOUTH EVERY DAY 90 tablet 0  . EPIPEN 2-PAK 0.3 MG/0.3ML SOAJ injection Reported on 06/21/2015  1   No current facility-administered medications for this visit.     SURGICAL HISTORY:  Past Surgical History:  Procedure Laterality Date  . ABLATION  09/2007   HTA and polyp resection  . BACK SURGERY  03/2004   herniation, L4-L5  . Bil Laprascopic knee surgery    . BONE MARROW BIOPSY     2011  . BONE MARROW BIOPSY  4/14  . CHOLECYSTECTOMY    . FOOT SURGERY  1998  . KNEE ARTHROSCOPY W/ MENISCAL REPAIR  10/10 ; 3/11  . LAPAROSCOPIC CHOLECYSTECTOMY  1997  . NASAL SINUS SURGERY  2004  . UTERINE FIBROID EMBOLIZATION      REVIEW OF SYSTEMS:  A comprehensive review of systems was negative except for: Constitutional: positive for fatigue Gastrointestinal: positive for diarrhea   PHYSICAL EXAMINATION: General appearance: alert, cooperative, fatigued and no distress Head: Normocephalic, without obvious abnormality, atraumatic Neck: no adenopathy Lymph nodes: Cervical, supraclavicular, and axillary  nodes normal. Resp: clear to auscultation bilaterally Back: symmetric, no curvature. ROM normal. No CVA tenderness. Cardio: regular rate and rhythm, S1, S2 normal, no murmur, click, rub or gallop GI: soft, non-tender; bowel sounds normal; no masses,  no organomegaly Extremities: extremities normal, atraumatic, no cyanosis or edema  ECOG PERFORMANCE STATUS: 1 - Symptomatic but completely ambulatory  Blood pressure (!) 137/59, pulse 97, temperature 98.4 F (36.9 C), temperature source Oral, resp. rate 18, height 5' 6.75" (1.695 m), weight 259 lb 3.2 oz (117.6 kg), SpO2 99 %.  LABORATORY DATA: Lab Results  Component Value Date   WBC 2.7 (L) 05/21/2017   HGB 9.9 (  L) 05/21/2017   HCT 31.9 (L) 05/21/2017   MCV 89.9 05/21/2017   PLT 106 (L) 05/21/2017      Chemistry      Component Value Date/Time   NA 139 05/14/2017 0821   NA 138 11/14/2016 0824   K 3.4 (L) 05/14/2017 0821   K 4.3 11/14/2016 0824   CL 104 05/14/2017 0821   CL 104 04/07/2012 1415   CO2 25 05/14/2017 0821   CO2 23 11/14/2016 0824   BUN 7 05/14/2017 0821   BUN 9.1 11/14/2016 0824   CREATININE 0.75 05/14/2017 0821   CREATININE 0.7 11/14/2016 0824      Component Value Date/Time   CALCIUM 9.0 05/14/2017 0821   CALCIUM 9.6 11/14/2016 0824   ALKPHOS 119 05/14/2017 0821   ALKPHOS 94 11/14/2016 0824   AST 40 (H) 05/14/2017 0821   AST 28 11/14/2016 0824   ALT 43 05/14/2017 0821   ALT 24 11/14/2016 0824   BILITOT 0.4 05/14/2017 0821   BILITOT 0.41 11/14/2016 0824      ASSESSMENT AND PLAN:  This is a very pleasant 57 years old white female with multiple myeloma status post several chemotherapy regimens. The patient has been on treatment with subcutaneous weekly Velcade as well as Revlimid and DecadronStatus post 14 cycles.  The patient continues to tolerate her treatment fairly well with no concerning complaints except for fatigue.  I recommended for her to continue her current treatment with Velcade, Revlimid and  Decadron as prescribed.  She will proceed with cycle #15 today. I will see her back for follow-up visit in 4 weeks for evaluation before starting cycle #16. She was advised to call immediately if she has any concerning symptoms in the interval. All questions were answered. The patient knows to call the clinic with any problems, questions or concerns. We can certainly see the patient much sooner if necessary.  Disclaimer: This note was dictated with voice recognition software. Similar sounding words can inadvertently be transcribed and may not be corrected upon review.     

## 2017-05-21 NOTE — Patient Instructions (Signed)
[facility] Discharge Instructions for Patients Receiving Chemotherapy  Today you received the following chemotherapy agents Velcade To help prevent nausea and vomiting after your treatment, we encourage you to take your nausea medication as prescribed.   If you develop nausea and vomiting that is not controlled by your nausea medication, call the clinic.   BELOW ARE SYMPTOMS THAT SHOULD BE REPORTED IMMEDIATELY:  *FEVER GREATER THAN 100.5 F  *CHILLS WITH OR WITHOUT FEVER  NAUSEA AND VOMITING THAT IS NOT CONTROLLED WITH YOUR NAUSEA MEDICATION  *UNUSUAL SHORTNESS OF BREATH  *UNUSUAL BRUISING OR BLEEDING  TENDERNESS IN MOUTH AND THROAT WITH OR WITHOUT PRESENCE OF ULCERS  *URINARY PROBLEMS  *BOWEL PROBLEMS  UNUSUAL RASH Items with * indicate a potential emergency and should be followed up as soon as possible.  Feel free to call the clinic should you have any questions or concerns. The clinic phone number is [phone].  Please show the CHEMO ALERT CARD at check-in to the Emergency Department and triage nurse.   

## 2017-05-22 ENCOUNTER — Telehealth: Payer: Self-pay | Admitting: Internal Medicine

## 2017-05-22 NOTE — Telephone Encounter (Signed)
Appts already scheduled per 5/14 los.

## 2017-05-28 ENCOUNTER — Inpatient Hospital Stay: Payer: BLUE CROSS/BLUE SHIELD

## 2017-05-28 VITALS — BP 130/58 | HR 61 | Temp 98.6°F | Resp 16

## 2017-05-28 DIAGNOSIS — R197 Diarrhea, unspecified: Secondary | ICD-10-CM | POA: Diagnosis not present

## 2017-05-28 DIAGNOSIS — Z7984 Long term (current) use of oral hypoglycemic drugs: Secondary | ICD-10-CM | POA: Diagnosis not present

## 2017-05-28 DIAGNOSIS — C9000 Multiple myeloma not having achieved remission: Secondary | ICD-10-CM

## 2017-05-28 DIAGNOSIS — Z79899 Other long term (current) drug therapy: Secondary | ICD-10-CM | POA: Diagnosis not present

## 2017-05-28 DIAGNOSIS — Z794 Long term (current) use of insulin: Secondary | ICD-10-CM | POA: Diagnosis not present

## 2017-05-28 DIAGNOSIS — R530 Neoplastic (malignant) related fatigue: Secondary | ICD-10-CM | POA: Diagnosis not present

## 2017-05-28 DIAGNOSIS — E119 Type 2 diabetes mellitus without complications: Secondary | ICD-10-CM | POA: Diagnosis not present

## 2017-05-28 DIAGNOSIS — Z5112 Encounter for antineoplastic immunotherapy: Secondary | ICD-10-CM | POA: Diagnosis not present

## 2017-05-28 LAB — CMP (CANCER CENTER ONLY)
ALBUMIN: 3.7 g/dL (ref 3.5–5.0)
ALT: 35 U/L (ref 0–55)
AST: 31 U/L (ref 5–34)
Alkaline Phosphatase: 102 U/L (ref 40–150)
Anion gap: 9 (ref 3–11)
BUN: 11 mg/dL (ref 7–26)
CO2: 23 mmol/L (ref 22–29)
Calcium: 9 mg/dL (ref 8.4–10.4)
Chloride: 106 mmol/L (ref 98–109)
Creatinine: 0.75 mg/dL (ref 0.60–1.10)
GFR, Estimated: >60 mL/min (ref >60)
GLUCOSE: 129 mg/dL (ref 70–140)
POTASSIUM: 4 mmol/L (ref 3.5–5.1)
SODIUM: 138 mmol/L (ref 136–145)
TOTAL PROTEIN: 6.4 g/dL (ref 6.4–8.3)
Total Bilirubin: 0.5 mg/dL (ref 0.2–1.2)

## 2017-05-28 LAB — CBC WITH DIFFERENTIAL (CANCER CENTER ONLY)
BASOS ABS: 0 10*3/uL (ref 0.0–0.1)
Basophils Relative: 2 %
Eosinophils Absolute: 0 10*3/uL (ref 0.0–0.5)
Eosinophils Relative: 1 %
HEMATOCRIT: 28.1 % — AB (ref 34.8–46.6)
HEMOGLOBIN: 9.1 g/dL — AB (ref 11.6–15.9)
LYMPHS PCT: 25 %
Lymphs Abs: 0.6 10*3/uL — ABNORMAL LOW (ref 0.9–3.3)
MCH: 27.8 pg (ref 25.1–34.0)
MCHC: 32.3 g/dL (ref 31.5–36.0)
MCV: 86.2 fL (ref 79.5–101.0)
MONO ABS: 0.3 10*3/uL (ref 0.1–0.9)
Monocytes Relative: 13 %
NEUTROS ABS: 1.5 10*3/uL (ref 1.5–6.5)
NEUTROS PCT: 59 %
Platelet Count: 126 10*3/uL — ABNORMAL LOW (ref 145–400)
RBC: 3.26 MIL/uL — ABNORMAL LOW (ref 3.70–5.45)
RDW: 19 % — AB (ref 11.2–14.5)
WBC Count: 2.5 10*3/uL — ABNORMAL LOW (ref 3.9–10.3)

## 2017-05-28 MED ORDER — ONDANSETRON HCL 8 MG PO TABS
8.0000 mg | ORAL_TABLET | Freq: Once | ORAL | Status: AC
  Administered 2017-05-28: 8 mg via ORAL

## 2017-05-28 MED ORDER — ONDANSETRON HCL 8 MG PO TABS
ORAL_TABLET | ORAL | Status: AC
  Filled 2017-05-28: qty 1

## 2017-05-28 MED ORDER — BORTEZOMIB CHEMO SQ INJECTION 3.5 MG (2.5MG/ML)
1.3000 mg/m2 | Freq: Once | INTRAMUSCULAR | Status: AC
  Administered 2017-05-28: 3 mg via SUBCUTANEOUS
  Filled 2017-05-28: qty 3

## 2017-05-28 NOTE — Patient Instructions (Signed)
[facility] Discharge Instructions for Patients Receiving Chemotherapy  Today you received the following chemotherapy agents Velcade.  To help prevent nausea and vomiting after your treatment, we encourage you to take your nausea medication as directed.  If you develop nausea and vomiting that is not controlled by your nausea medication, call the clinic.   BELOW ARE SYMPTOMS THAT SHOULD BE REPORTED IMMEDIATELY:  *FEVER GREATER THAN 100.5 F  *CHILLS WITH OR WITHOUT FEVER  NAUSEA AND VOMITING THAT IS NOT CONTROLLED WITH YOUR NAUSEA MEDICATION  *UNUSUAL SHORTNESS OF BREATH  *UNUSUAL BRUISING OR BLEEDING  TENDERNESS IN MOUTH AND THROAT WITH OR WITHOUT PRESENCE OF ULCERS  *URINARY PROBLEMS  *BOWEL PROBLEMS  UNUSUAL RASH Items with * indicate a potential emergency and should be followed up as soon as possible.  Feel free to call the clinic should you have any questions or concerns. The clinic phone number is [phone].  Please show the CHEMO ALERT CARD at check-in to the Emergency Department and triage nurse.   

## 2017-06-04 ENCOUNTER — Inpatient Hospital Stay: Payer: BLUE CROSS/BLUE SHIELD

## 2017-06-04 VITALS — BP 137/55 | HR 67 | Temp 98.2°F | Resp 16

## 2017-06-04 DIAGNOSIS — C9000 Multiple myeloma not having achieved remission: Secondary | ICD-10-CM | POA: Diagnosis not present

## 2017-06-04 DIAGNOSIS — Z7984 Long term (current) use of oral hypoglycemic drugs: Secondary | ICD-10-CM | POA: Diagnosis not present

## 2017-06-04 DIAGNOSIS — Z794 Long term (current) use of insulin: Secondary | ICD-10-CM | POA: Diagnosis not present

## 2017-06-04 DIAGNOSIS — Z5112 Encounter for antineoplastic immunotherapy: Secondary | ICD-10-CM | POA: Diagnosis not present

## 2017-06-04 DIAGNOSIS — E119 Type 2 diabetes mellitus without complications: Secondary | ICD-10-CM | POA: Diagnosis not present

## 2017-06-04 DIAGNOSIS — R197 Diarrhea, unspecified: Secondary | ICD-10-CM | POA: Diagnosis not present

## 2017-06-04 DIAGNOSIS — Z79899 Other long term (current) drug therapy: Secondary | ICD-10-CM | POA: Diagnosis not present

## 2017-06-04 DIAGNOSIS — R530 Neoplastic (malignant) related fatigue: Secondary | ICD-10-CM | POA: Diagnosis not present

## 2017-06-04 LAB — CBC WITH DIFFERENTIAL (CANCER CENTER ONLY)
BASOS PCT: 2 %
Basophils Absolute: 0.1 10*3/uL (ref 0.0–0.1)
Eosinophils Absolute: 0.1 10*3/uL (ref 0.0–0.5)
Eosinophils Relative: 3 %
HEMATOCRIT: 29.3 % — AB (ref 34.8–46.6)
HEMOGLOBIN: 9.6 g/dL — AB (ref 11.6–15.9)
LYMPHS ABS: 0.6 10*3/uL — AB (ref 0.9–3.3)
Lymphocytes Relative: 22 %
MCH: 28.5 pg (ref 25.1–34.0)
MCHC: 32.7 g/dL (ref 31.5–36.0)
MCV: 87 fL (ref 79.5–101.0)
MONOS PCT: 6 %
Monocytes Absolute: 0.2 10*3/uL (ref 0.1–0.9)
NEUTROS ABS: 1.8 10*3/uL (ref 1.5–6.5)
NEUTROS PCT: 67 %
Platelet Count: 122 10*3/uL — ABNORMAL LOW (ref 145–400)
RBC: 3.37 MIL/uL — AB (ref 3.70–5.45)
RDW: 19.2 % — ABNORMAL HIGH (ref 11.2–14.5)
WBC: 2.7 10*3/uL — AB (ref 3.9–10.3)

## 2017-06-04 LAB — COMPREHENSIVE METABOLIC PANEL
ALBUMIN: 4.4 g/dL (ref 3.5–5.0)
ALK PHOS: 94 U/L (ref 38–126)
ALT: 35 U/L (ref 14–54)
ANION GAP: 13 (ref 5–15)
AST: 38 U/L (ref 15–41)
BILIRUBIN TOTAL: 0.6 mg/dL (ref 0.3–1.2)
BUN: 9 mg/dL (ref 6–20)
CALCIUM: 9 mg/dL (ref 8.9–10.3)
CO2: 20 mmol/L — AB (ref 22–32)
CREATININE: 0.7 mg/dL (ref 0.44–1.00)
Chloride: 105 mmol/L (ref 101–111)
GFR calc Af Amer: >60 mL/min (ref >60)
GFR calc non Af Amer: >60 mL/min (ref >60)
GLUCOSE: 143 mg/dL — AB (ref 65–99)
Potassium: 3.6 mmol/L (ref 3.5–5.1)
SODIUM: 138 mmol/L (ref 135–145)
TOTAL PROTEIN: 7.2 g/dL (ref 6.5–8.1)

## 2017-06-04 MED ORDER — ONDANSETRON HCL 8 MG PO TABS
8.0000 mg | ORAL_TABLET | Freq: Once | ORAL | Status: AC
  Administered 2017-06-04: 8 mg via ORAL

## 2017-06-04 MED ORDER — ONDANSETRON HCL 8 MG PO TABS
ORAL_TABLET | ORAL | Status: AC
  Filled 2017-06-04: qty 1

## 2017-06-04 MED ORDER — BORTEZOMIB CHEMO SQ INJECTION 3.5 MG (2.5MG/ML)
1.3000 mg/m2 | Freq: Once | INTRAMUSCULAR | Status: AC
  Administered 2017-06-04: 3 mg via SUBCUTANEOUS
  Filled 2017-06-04: qty 3

## 2017-06-04 NOTE — Patient Instructions (Signed)
[facility] Discharge Instructions for Patients Receiving Chemotherapy  Today you received the following chemotherapy agents Velcade.  To help prevent nausea and vomiting after your treatment, we encourage you to take your nausea medication as directed.  If you develop nausea and vomiting that is not controlled by your nausea medication, call the clinic.   BELOW ARE SYMPTOMS THAT SHOULD BE REPORTED IMMEDIATELY:  *FEVER GREATER THAN 100.5 F  *CHILLS WITH OR WITHOUT FEVER  NAUSEA AND VOMITING THAT IS NOT CONTROLLED WITH YOUR NAUSEA MEDICATION  *UNUSUAL SHORTNESS OF BREATH  *UNUSUAL BRUISING OR BLEEDING  TENDERNESS IN MOUTH AND THROAT WITH OR WITHOUT PRESENCE OF ULCERS  *URINARY PROBLEMS  *BOWEL PROBLEMS  UNUSUAL RASH Items with * indicate a potential emergency and should be followed up as soon as possible.  Feel free to call the clinic should you have any questions or concerns. The clinic phone number is [phone].  Please show the CHEMO ALERT CARD at check-in to the Emergency Department and triage nurse.   

## 2017-06-05 ENCOUNTER — Other Ambulatory Visit: Payer: Self-pay | Admitting: Medical Oncology

## 2017-06-05 DIAGNOSIS — C9000 Multiple myeloma not having achieved remission: Secondary | ICD-10-CM

## 2017-06-05 MED ORDER — REVLIMID 25 MG PO CAPS: ORAL_CAPSULE | ORAL | 0 refills | Status: DC

## 2017-06-07 DIAGNOSIS — D225 Melanocytic nevi of trunk: Secondary | ICD-10-CM | POA: Diagnosis not present

## 2017-06-07 DIAGNOSIS — L853 Xerosis cutis: Secondary | ICD-10-CM | POA: Diagnosis not present

## 2017-06-07 DIAGNOSIS — D692 Other nonthrombocytopenic purpura: Secondary | ICD-10-CM | POA: Diagnosis not present

## 2017-06-07 DIAGNOSIS — D1801 Hemangioma of skin and subcutaneous tissue: Secondary | ICD-10-CM | POA: Diagnosis not present

## 2017-06-11 ENCOUNTER — Inpatient Hospital Stay: Payer: BLUE CROSS/BLUE SHIELD

## 2017-06-11 ENCOUNTER — Inpatient Hospital Stay: Payer: BLUE CROSS/BLUE SHIELD | Attending: Internal Medicine

## 2017-06-11 VITALS — BP 143/65 | HR 69 | Temp 98.2°F | Resp 18

## 2017-06-11 DIAGNOSIS — E119 Type 2 diabetes mellitus without complications: Secondary | ICD-10-CM | POA: Diagnosis not present

## 2017-06-11 DIAGNOSIS — Z794 Long term (current) use of insulin: Secondary | ICD-10-CM | POA: Diagnosis not present

## 2017-06-11 DIAGNOSIS — Z7901 Long term (current) use of anticoagulants: Secondary | ICD-10-CM | POA: Diagnosis not present

## 2017-06-11 DIAGNOSIS — Z79899 Other long term (current) drug therapy: Secondary | ICD-10-CM | POA: Diagnosis not present

## 2017-06-11 DIAGNOSIS — C9000 Multiple myeloma not having achieved remission: Secondary | ICD-10-CM | POA: Insufficient documentation

## 2017-06-11 DIAGNOSIS — R530 Neoplastic (malignant) related fatigue: Secondary | ICD-10-CM | POA: Diagnosis not present

## 2017-06-11 DIAGNOSIS — Z5112 Encounter for antineoplastic immunotherapy: Secondary | ICD-10-CM | POA: Insufficient documentation

## 2017-06-11 LAB — CBC WITH DIFFERENTIAL (CANCER CENTER ONLY)
BASOS ABS: 0 10*3/uL (ref 0.0–0.1)
Basophils Relative: 1 %
EOS PCT: 3 %
Eosinophils Absolute: 0.1 10*3/uL (ref 0.0–0.5)
HCT: 28.7 % — ABNORMAL LOW (ref 34.8–46.6)
Hemoglobin: 9.5 g/dL — ABNORMAL LOW (ref 11.6–15.9)
LYMPHS PCT: 20 %
Lymphs Abs: 0.5 10*3/uL — ABNORMAL LOW (ref 0.9–3.3)
MCH: 28.7 pg (ref 25.1–34.0)
MCHC: 33.2 g/dL (ref 31.5–36.0)
MCV: 86.5 fL (ref 79.5–101.0)
Monocytes Absolute: 0.3 10*3/uL (ref 0.1–0.9)
Monocytes Relative: 12 %
Neutro Abs: 1.8 10*3/uL (ref 1.5–6.5)
Neutrophils Relative %: 64 %
PLATELETS: 97 10*3/uL — AB (ref 145–400)
RBC: 3.32 MIL/uL — ABNORMAL LOW (ref 3.70–5.45)
RDW: 18.7 % — ABNORMAL HIGH (ref 11.2–14.5)
WBC Count: 2.8 10*3/uL — ABNORMAL LOW (ref 3.9–10.3)

## 2017-06-11 LAB — CMP (CANCER CENTER ONLY)
ALT: 33 U/L (ref 0–55)
AST: 33 U/L (ref 5–34)
Albumin: 3.8 g/dL (ref 3.5–5.0)
Alkaline Phosphatase: 110 U/L (ref 40–150)
Anion gap: 13 — ABNORMAL HIGH (ref 3–11)
BUN: 7 mg/dL (ref 7–26)
CHLORIDE: 105 mmol/L (ref 98–109)
CO2: 22 mmol/L (ref 22–29)
CREATININE: 0.77 mg/dL (ref 0.60–1.10)
Calcium: 8.8 mg/dL (ref 8.4–10.4)
GFR, Est AFR Am: >60 mL/min (ref >60)
GLUCOSE: 137 mg/dL (ref 70–140)
Potassium: 3.4 mmol/L — ABNORMAL LOW (ref 3.5–5.1)
SODIUM: 140 mmol/L (ref 136–145)
Total Bilirubin: 0.7 mg/dL (ref 0.2–1.2)
Total Protein: 6.6 g/dL (ref 6.4–8.3)

## 2017-06-11 MED ORDER — ONDANSETRON HCL 8 MG PO TABS
8.0000 mg | ORAL_TABLET | Freq: Once | ORAL | Status: AC
  Administered 2017-06-11: 8 mg via ORAL

## 2017-06-11 MED ORDER — ONDANSETRON HCL 8 MG PO TABS
ORAL_TABLET | ORAL | Status: AC
  Filled 2017-06-11: qty 1

## 2017-06-11 MED ORDER — BORTEZOMIB CHEMO SQ INJECTION 3.5 MG (2.5MG/ML)
1.3000 mg/m2 | Freq: Once | INTRAMUSCULAR | Status: AC
  Administered 2017-06-11: 3 mg via SUBCUTANEOUS
  Filled 2017-06-11: qty 1.2

## 2017-06-11 NOTE — Progress Notes (Signed)
Per Dr. Benay Spice okay to treat pt with plt of 97

## 2017-06-18 ENCOUNTER — Encounter: Payer: Self-pay | Admitting: Internal Medicine

## 2017-06-18 ENCOUNTER — Inpatient Hospital Stay: Payer: BLUE CROSS/BLUE SHIELD

## 2017-06-18 ENCOUNTER — Inpatient Hospital Stay (HOSPITAL_BASED_OUTPATIENT_CLINIC_OR_DEPARTMENT_OTHER): Payer: BLUE CROSS/BLUE SHIELD | Admitting: Internal Medicine

## 2017-06-18 ENCOUNTER — Telehealth: Payer: Self-pay | Admitting: Internal Medicine

## 2017-06-18 VITALS — BP 137/57 | HR 56 | Temp 97.9°F | Resp 19 | Ht 66.75 in | Wt 259.5 lb

## 2017-06-18 DIAGNOSIS — Z7901 Long term (current) use of anticoagulants: Secondary | ICD-10-CM | POA: Diagnosis not present

## 2017-06-18 DIAGNOSIS — Z5112 Encounter for antineoplastic immunotherapy: Secondary | ICD-10-CM | POA: Diagnosis not present

## 2017-06-18 DIAGNOSIS — Z794 Long term (current) use of insulin: Secondary | ICD-10-CM

## 2017-06-18 DIAGNOSIS — C9000 Multiple myeloma not having achieved remission: Secondary | ICD-10-CM

## 2017-06-18 DIAGNOSIS — Z79899 Other long term (current) drug therapy: Secondary | ICD-10-CM

## 2017-06-18 DIAGNOSIS — R530 Neoplastic (malignant) related fatigue: Secondary | ICD-10-CM | POA: Diagnosis not present

## 2017-06-18 DIAGNOSIS — E119 Type 2 diabetes mellitus without complications: Secondary | ICD-10-CM | POA: Diagnosis not present

## 2017-06-18 DIAGNOSIS — Z5111 Encounter for antineoplastic chemotherapy: Secondary | ICD-10-CM

## 2017-06-18 LAB — CBC WITH DIFFERENTIAL (CANCER CENTER ONLY)
BASOS ABS: 0 10*3/uL (ref 0.0–0.1)
BASOS PCT: 2 %
EOS ABS: 0.1 10*3/uL (ref 0.0–0.5)
EOS PCT: 5 %
HCT: 29.4 % — ABNORMAL LOW (ref 34.8–46.6)
Hemoglobin: 9.4 g/dL — ABNORMAL LOW (ref 11.6–15.9)
Lymphocytes Relative: 27 %
Lymphs Abs: 0.7 10*3/uL — ABNORMAL LOW (ref 0.9–3.3)
MCH: 28.4 pg (ref 25.1–34.0)
MCHC: 32 g/dL (ref 31.5–36.0)
MCV: 88.8 fL (ref 79.5–101.0)
MONO ABS: 0.3 10*3/uL (ref 0.1–0.9)
Monocytes Relative: 12 %
Neutro Abs: 1.3 10*3/uL — ABNORMAL LOW (ref 1.5–6.5)
Neutrophils Relative %: 54 %
PLATELETS: 98 10*3/uL — AB (ref 145–400)
RBC: 3.31 MIL/uL — ABNORMAL LOW (ref 3.70–5.45)
RDW: 17 % — AB (ref 11.2–14.5)
WBC Count: 2.4 10*3/uL — ABNORMAL LOW (ref 3.9–10.3)

## 2017-06-18 LAB — CMP (CANCER CENTER ONLY)
ALBUMIN: 3.7 g/dL (ref 3.5–5.0)
ALT: 32 U/L (ref 0–55)
ANION GAP: 11 (ref 3–11)
AST: 32 U/L (ref 5–34)
Alkaline Phosphatase: 107 U/L (ref 40–150)
BUN: 7 mg/dL (ref 7–26)
CHLORIDE: 104 mmol/L (ref 98–109)
CO2: 25 mmol/L (ref 22–29)
Calcium: 9.2 mg/dL (ref 8.4–10.4)
Creatinine: 0.73 mg/dL (ref 0.60–1.10)
GFR, Est AFR Am: >60 mL/min (ref >60)
GFR, Estimated: >60 mL/min (ref >60)
GLUCOSE: 129 mg/dL (ref 70–140)
POTASSIUM: 3.9 mmol/L (ref 3.5–5.1)
Sodium: 140 mmol/L (ref 136–145)
Total Bilirubin: 0.7 mg/dL (ref 0.2–1.2)
Total Protein: 6.4 g/dL (ref 6.4–8.3)

## 2017-06-18 MED ORDER — ONDANSETRON HCL 8 MG PO TABS
8.0000 mg | ORAL_TABLET | Freq: Once | ORAL | Status: AC
  Administered 2017-06-18: 8 mg via ORAL

## 2017-06-18 MED ORDER — ONDANSETRON HCL 8 MG PO TABS
ORAL_TABLET | ORAL | Status: AC
  Filled 2017-06-18: qty 1

## 2017-06-18 MED ORDER — BORTEZOMIB CHEMO SQ INJECTION 3.5 MG (2.5MG/ML)
1.3000 mg/m2 | Freq: Once | INTRAMUSCULAR | Status: AC
  Administered 2017-06-18: 3 mg via SUBCUTANEOUS
  Filled 2017-06-18: qty 1.2

## 2017-06-18 NOTE — Progress Notes (Signed)
Ok to treat with ANC 1.3 and plts 98 per Dr. Julien Nordmann.

## 2017-06-18 NOTE — Progress Notes (Signed)
.[place] Telephone:([phone]   [phone]  OFFICE PROGRESS NOTE  Brenda Conner, Brenda Means, MD [name] [address]  DIAGNOSIS: Plasma cell dyscrasia initially diagnosed as MGUS in September 2010, with additional symptoms suggestive of POEMS syndrome.   PRIOR THERAPY:  1) Velcade 1.3 MG/M2 subcutaneously with Decadron 40 mg by mouth on a weekly basis. First cycle 11/24/2013. She status post 31 weekly doses of treatment. 2) Velcade 1.3 MG/M2 subcutaneously and weekly basis with Decadron 40 mg by mouth weekly. First dose 02/01/2015. Status post 28 cycles. 3) Revlimid 25 mg by mouth daily for 21 days every 4 weeks with weekly Decadron 20 mg. started in 11/27/2015. Status post 3 cycles discontinued secondary to lack of response.  CURRENT THERAPY: Systemic treatment with Velcade 1.3 MG/KG weekly, Revlimid 25 mg by mouth daily for 21 days every 4 weeks in addition to Decadron 20 mg by mouth weekly. First dose 03/06/2016. Status post 15 cycles.   INTERVAL HISTORY: Brenda Conner 57 y.o. female returns to the clinic today for follow-up visit.  The patient is feeling fine today with no specific complaints.  She continues to tolerate her treatment fairly well except for fatigue.  She denied having any chest pain, shortness of breath, cough or hemoptysis.  She denied having any fever or chills.  She has no nausea, vomiting, diarrhea or constipation.  She has no weight loss or night sweats.  She is here today for evaluation before starting cycle #16.   MEDICAL HISTORY: Past Medical History:  Diagnosis Date  . Acid reflux   . Anxiety   . Asthma   . Cancer [facility])    waldenstroms/ macroglobinulemia  . Depression   . Depression   . Diabetes mellitus without complication ([place])   . Dysuria 02/28/2016  . Hypercholesteremia   . Hypertension   . Hypothyroidism   . Macroglobulinemia ([place])    ? POEMS syndrome  . Multiple myeloma ([place])   . Obesity   .  PONV (postoperative nausea and vomiting)   . Sleep apnea    CPAP at bedtime  . Sleep apnea     ALLERGIES:  is allergic to codeine; hydrocodone; lortab [hydrocodone-acetaminophen]; onion; shellfish allergy; and amoxicillin.  MEDICATIONS:  Current Outpatient Medications  Medication Sig Dispense Refill  . acyclovir (ZOVIRAX) 400 MG tablet Take 1 tablet (400 mg total) by mouth 2 (two) times daily. 180 tablet 0  . ALPRAZolam (XANAX) 0.5 MG tablet Take 1 tablet (0.5 mg total) by mouth at bedtime as needed for anxiety. 30 tablet 0  . Azelastine HCl 0.15 % SOLN as needed.    . B-D UF III MINI PEN NEEDLES 31G X 5 MM MISC     . Blood Glucose Monitoring Suppl (ONE TOUCH ULTRA MINI) W/DEVICE KIT See admin instructions. Reported on 01/25/2015  0  . Bortezomib (VELCADE IJ) Inject as directed once a week.    . Cetirizine HCl (ZYRTEC ALLERGY PO) Take by mouth daily.    [name] dexamethasone (DECADRON) 4 MG tablet 5 tablet by mouth every week. Start with the first dose of chemotherapy. 40 tablet 3  . DiphenhydrAMINE HCl (BENADRYL ALLERGY PO) Take by mouth as needed.    [name] EPIPEN 2-PAK 0.3 MG/0.3ML SOAJ injection Reported on 06/21/2015  1  . Fexofenadine HCl (ALLEGRA ALLERGY PO) Take by mouth daily.    . Fluticasone-Salmeterol (ADVAIR) 100-50 MCG/DOSE AEPB Inhale 2 puffs into the lungs every 12 (twelve) hours.    [name]  ibuprofen (ADVIL,MOTRIN) 100 MG tablet Take 100 mg by mouth every 6 (six) hours as needed. Reported on 05/31/2015    . insulin glargine (LANTUS) 100 UNIT/ML injection Inject 8 Units into the skin 2 (two) times a week. Reported on 02/18/2015    . levothyroxine (SYNTHROID, LEVOTHROID) 150 MCG tablet Take 150 mcg by mouth daily before breakfast.    . lisinopril (PRINIVIL,ZESTRIL) 10 MG tablet Take 10 mg by mouth daily.  3  . loperamide (IMODIUM) 2 MG capsule Take 2 mg by mouth as needed for diarrhea or loose stools.    . meloxicam (MOBIC) 15 MG tablet Take 15 mg daily by mouth.  3  . metFORMIN (GLUCOPHAGE)  1000 MG tablet Take 1,000 mg by mouth 2 (two) times daily.  3  . NOVOLOG FLEXPEN 100 UNIT/ML FlexPen 3 Units daily as needed. Reported on 01/25/2015  6  . omeprazole (PRILOSEC) 40 MG capsule Take 40 mg by mouth 2 (two) times daily.     . ondansetron (ZOFRAN-ODT) 8 MG disintegrating tablet Take 1 tablet (8 mg total) by mouth every 8 (eight) hours as needed. Reported on 05/31/2015 20 tablet 2  . ONE TOUCH ULTRA TEST test strip See admin instructions. Reported on 01/25/2015  11  . pravastatin (PRAVACHOL) 40 MG tablet Take 40 mg by mouth every evening.     [name] PROAIR HFA 108 (90 Base) MCG/ACT inhaler Reported on 06/21/2015  1  . REVLIMID 25 MG capsule 06/05/17 Authorization# [id number] female not of child bearing potential 21 capsule 0  . sertraline (ZOLOFT) 50 MG tablet TAKE 3 TABLETS BY MOUTH EVERY DAY 270 tablet 3  . verapamil (CALAN-SR) 240 MG CR tablet Take 240 mg by mouth 2 (two) times daily.    [name] warfarin (COUMADIN) 2 MG tablet TAKE 1 TABLET BY MOUTH EVERY DAY 90 tablet 0   No current facility-administered medications for this visit.     SURGICAL HISTORY:  Past Surgical History:  Procedure Laterality Date  . ABLATION  09/2007   HTA and polyp resection  . BACK SURGERY  03/2004   herniation, L4-L5  . Bil Laprascopic knee surgery    . BONE MARROW BIOPSY     2011  . BONE MARROW BIOPSY  4/14  . CHOLECYSTECTOMY    . FOOT SURGERY  1998  . KNEE ARTHROSCOPY W/ MENISCAL REPAIR  10/10 ; 3/11  . LAPAROSCOPIC CHOLECYSTECTOMY  1997  . NASAL SINUS SURGERY  2004  . UTERINE FIBROID EMBOLIZATION      REVIEW OF SYSTEMS:  A comprehensive review of systems was negative except for: Constitutional: positive for fatigue   PHYSICAL EXAMINATION: General appearance: alert, cooperative, fatigued and no distress Head: Normocephalic, without obvious abnormality, atraumatic Neck: no adenopathy Lymph nodes: Cervical, supraclavicular, and axillary nodes normal. Resp: clear to auscultation bilaterally Back:  symmetric, no curvature. ROM normal. No CVA tenderness. Cardio: regular rate and rhythm, S1, S2 normal, no murmur, click, rub or gallop GI: soft, non-tender; bowel sounds normal; no masses,  no organomegaly Extremities: extremities normal, atraumatic, no cyanosis or edema  ECOG PERFORMANCE STATUS: 1 - Symptomatic but completely ambulatory  Blood pressure (!) 137/57, pulse (!) 56, temperature 97.9 F (36.6 C), temperature source Oral, resp. rate 19, height 5' 6.75" (1.695 m), weight 259 lb 8 oz (117.7 kg), SpO2 100 %.  LABORATORY DATA: Lab Results  Component Value Date   WBC 2.4 (L) 06/18/2017   HGB 9.4 (L) 06/18/2017   HCT 29.4 (L) 06/18/2017   MCV 88.8 06/18/2017  PLT 98 (L) 06/18/2017      Chemistry      Component Value Date/Time   NA 140 06/11/2017 0817   NA 138 11/14/2016 0824   K 3.4 (L) 06/11/2017 0817   K 4.3 11/14/2016 0824   CL 105 06/11/2017 0817   CL 104 04/07/2012 1415   CO2 22 06/11/2017 0817   CO2 23 11/14/2016 0824   BUN 7 06/11/2017 0817   BUN 9.1 11/14/2016 0824   CREATININE 0.77 06/11/2017 0817   CREATININE 0.7 11/14/2016 0824      Component Value Date/Time   CALCIUM 8.8 06/11/2017 0817   CALCIUM 9.6 11/14/2016 0824   ALKPHOS 110 06/11/2017 0817   ALKPHOS 94 11/14/2016 0824   AST 33 06/11/2017 0817   AST 28 11/14/2016 0824   ALT 33 06/11/2017 0817   ALT 24 11/14/2016 0824   BILITOT 0.7 06/11/2017 0817   BILITOT 0.41 11/14/2016 0824      ASSESSMENT AND PLAN:  This is a very pleasant 57 years old white female with multiple myeloma status post several chemotherapy regimens. The patient has been on treatment with subcutaneous weekly Velcade as well as Revlimid and DecadronStatus post 15 cycles.  She continues to tolerate her treatment well with no concerning complaints except for fatigue. I recommended for the patient to proceed with cycle #16 today as scheduled. She plans to travel to [place] during 4 July week. I will arrange for the  patient to have myeloma panel before her next visit with me on July 17, 2017. The patient was advised to call immediately if she has any concerning symptoms in the interval. All questions were answered. The patient knows to call the clinic with any problems, questions or concerns. We can certainly see the patient much sooner if necessary.  Disclaimer: This note was dictated with voice recognition software. Similar sounding words can inadvertently be transcribed and may not be corrected upon review.

## 2017-06-18 NOTE — Patient Instructions (Signed)
[facility] Discharge Instructions for Patients Receiving Chemotherapy  Today you received the following chemotherapy agents velcade   To help prevent nausea and vomiting after your treatment, we encourage you to take your nausea medication as directed  If you develop nausea and vomiting that is not controlled by your nausea medication, call the clinic.   BELOW ARE SYMPTOMS THAT SHOULD BE REPORTED IMMEDIATELY:  *FEVER GREATER THAN 100.5 F  *CHILLS WITH OR WITHOUT FEVER  NAUSEA AND VOMITING THAT IS NOT CONTROLLED WITH YOUR NAUSEA MEDICATION  *UNUSUAL SHORTNESS OF BREATH  *UNUSUAL BRUISING OR BLEEDING  TENDERNESS IN MOUTH AND THROAT WITH OR WITHOUT PRESENCE OF ULCERS  *URINARY PROBLEMS  *BOWEL PROBLEMS  UNUSUAL RASH Items with * indicate a potential emergency and should be followed up as soon as possible.  Feel free to call the clinic you have any questions or concerns. The clinic phone number is [phone].  

## 2017-06-18 NOTE — Telephone Encounter (Signed)
Appointments scheduled AVS/Calendar printed per 6/11 los °

## 2017-06-24 DIAGNOSIS — J3081 Allergic rhinitis due to animal (cat) (dog) hair and dander: Secondary | ICD-10-CM | POA: Diagnosis not present

## 2017-06-24 DIAGNOSIS — J3089 Other allergic rhinitis: Secondary | ICD-10-CM | POA: Diagnosis not present

## 2017-06-24 DIAGNOSIS — J301 Allergic rhinitis due to pollen: Secondary | ICD-10-CM | POA: Diagnosis not present

## 2017-06-24 DIAGNOSIS — J4530 Mild persistent asthma, uncomplicated: Secondary | ICD-10-CM | POA: Diagnosis not present

## 2017-06-25 ENCOUNTER — Inpatient Hospital Stay: Payer: BLUE CROSS/BLUE SHIELD

## 2017-06-25 VITALS — BP 134/61 | HR 55 | Temp 98.1°F | Resp 16

## 2017-06-25 DIAGNOSIS — Z5112 Encounter for antineoplastic immunotherapy: Secondary | ICD-10-CM | POA: Diagnosis not present

## 2017-06-25 DIAGNOSIS — E119 Type 2 diabetes mellitus without complications: Secondary | ICD-10-CM | POA: Diagnosis not present

## 2017-06-25 DIAGNOSIS — C9000 Multiple myeloma not having achieved remission: Secondary | ICD-10-CM

## 2017-06-25 DIAGNOSIS — Z794 Long term (current) use of insulin: Secondary | ICD-10-CM | POA: Diagnosis not present

## 2017-06-25 DIAGNOSIS — R530 Neoplastic (malignant) related fatigue: Secondary | ICD-10-CM | POA: Diagnosis not present

## 2017-06-25 DIAGNOSIS — Z79899 Other long term (current) drug therapy: Secondary | ICD-10-CM | POA: Diagnosis not present

## 2017-06-25 DIAGNOSIS — Z7901 Long term (current) use of anticoagulants: Secondary | ICD-10-CM | POA: Diagnosis not present

## 2017-06-25 LAB — CMP (CANCER CENTER ONLY)
ALT: 27 U/L (ref 0–55)
AST: 33 U/L (ref 5–34)
Albumin: 3.7 g/dL (ref 3.5–5.0)
Alkaline Phosphatase: 103 U/L (ref 40–150)
Anion gap: 9 (ref 3–11)
BILIRUBIN TOTAL: 0.4 mg/dL (ref 0.2–1.2)
BUN: 8 mg/dL (ref 7–26)
CO2: 22 mmol/L (ref 22–29)
CREATININE: 0.76 mg/dL (ref 0.60–1.10)
Calcium: 9 mg/dL (ref 8.4–10.4)
Chloride: 109 mmol/L (ref 98–109)
GFR, Est AFR Am: >60 mL/min (ref >60)
Glucose, Bld: 120 mg/dL (ref 70–140)
Potassium: 3.6 mmol/L (ref 3.5–5.1)
Sodium: 140 mmol/L (ref 136–145)
TOTAL PROTEIN: 6.3 g/dL — AB (ref 6.4–8.3)

## 2017-06-25 LAB — CBC WITH DIFFERENTIAL (CANCER CENTER ONLY)
BASOS ABS: 0.1 10*3/uL (ref 0.0–0.1)
Basophils Relative: 2 %
Eosinophils Absolute: 0 10*3/uL (ref 0.0–0.5)
Eosinophils Relative: 1 %
HEMATOCRIT: 28.9 % — AB (ref 34.8–46.6)
Hemoglobin: 9 g/dL — ABNORMAL LOW (ref 11.6–15.9)
LYMPHS ABS: 0.8 10*3/uL — AB (ref 0.9–3.3)
LYMPHS PCT: 28 %
MCH: 28 pg (ref 25.1–34.0)
MCHC: 31.1 g/dL — ABNORMAL LOW (ref 31.5–36.0)
MCV: 90 fL (ref 79.5–101.0)
MONO ABS: 0.4 10*3/uL (ref 0.1–0.9)
Monocytes Relative: 13 %
NEUTROS ABS: 1.5 10*3/uL (ref 1.5–6.5)
Neutrophils Relative %: 56 %
Platelet Count: 102 10*3/uL — ABNORMAL LOW (ref 145–400)
RBC: 3.21 MIL/uL — AB (ref 3.70–5.45)
RDW: 17.1 % — ABNORMAL HIGH (ref 11.2–14.5)
WBC: 2.8 10*3/uL — AB (ref 3.9–10.3)

## 2017-06-25 MED ORDER — BORTEZOMIB CHEMO SQ INJECTION 3.5 MG (2.5MG/ML)
1.3000 mg/m2 | Freq: Once | INTRAMUSCULAR | Status: AC
  Administered 2017-06-25: 3 mg via SUBCUTANEOUS
  Filled 2017-06-25: qty 1.2

## 2017-06-25 MED ORDER — ONDANSETRON HCL 8 MG PO TABS
ORAL_TABLET | ORAL | Status: AC
  Filled 2017-06-25: qty 1

## 2017-06-25 MED ORDER — ONDANSETRON HCL 8 MG PO TABS
8.0000 mg | ORAL_TABLET | Freq: Once | ORAL | Status: AC
  Administered 2017-06-25: 8 mg via ORAL

## 2017-06-25 NOTE — Patient Instructions (Signed)
[facility] Discharge Instructions for Patients Receiving Chemotherapy  Today you received the following chemotherapy agents Velcade.  To help prevent nausea and vomiting after your treatment, we encourage you to take your nausea medication as directed.  If you develop nausea and vomiting that is not controlled by your nausea medication, call the clinic.   BELOW ARE SYMPTOMS THAT SHOULD BE REPORTED IMMEDIATELY:  *FEVER GREATER THAN 100.5 F  *CHILLS WITH OR WITHOUT FEVER  NAUSEA AND VOMITING THAT IS NOT CONTROLLED WITH YOUR NAUSEA MEDICATION  *UNUSUAL SHORTNESS OF BREATH  *UNUSUAL BRUISING OR BLEEDING  TENDERNESS IN MOUTH AND THROAT WITH OR WITHOUT PRESENCE OF ULCERS  *URINARY PROBLEMS  *BOWEL PROBLEMS  UNUSUAL RASH Items with * indicate a potential emergency and should be followed up as soon as possible.  Feel free to call the clinic should you have any questions or concerns. The clinic phone number is [phone].  Please show the CHEMO ALERT CARD at check-in to the Emergency Department and triage nurse.   

## 2017-07-02 ENCOUNTER — Inpatient Hospital Stay: Payer: BLUE CROSS/BLUE SHIELD

## 2017-07-02 VITALS — BP 127/76 | HR 63 | Temp 97.7°F | Resp 16

## 2017-07-02 DIAGNOSIS — C9000 Multiple myeloma not having achieved remission: Secondary | ICD-10-CM | POA: Diagnosis not present

## 2017-07-02 DIAGNOSIS — Z794 Long term (current) use of insulin: Secondary | ICD-10-CM | POA: Diagnosis not present

## 2017-07-02 DIAGNOSIS — Z79899 Other long term (current) drug therapy: Secondary | ICD-10-CM | POA: Diagnosis not present

## 2017-07-02 DIAGNOSIS — Z5112 Encounter for antineoplastic immunotherapy: Secondary | ICD-10-CM | POA: Diagnosis not present

## 2017-07-02 DIAGNOSIS — R530 Neoplastic (malignant) related fatigue: Secondary | ICD-10-CM | POA: Diagnosis not present

## 2017-07-02 DIAGNOSIS — E119 Type 2 diabetes mellitus without complications: Secondary | ICD-10-CM | POA: Diagnosis not present

## 2017-07-02 DIAGNOSIS — Z7901 Long term (current) use of anticoagulants: Secondary | ICD-10-CM | POA: Diagnosis not present

## 2017-07-02 LAB — CBC WITH DIFFERENTIAL (CANCER CENTER ONLY)
BASOS ABS: 0 10*3/uL (ref 0.0–0.1)
BASOS PCT: 2 %
EOS PCT: 3 %
Eosinophils Absolute: 0.1 10*3/uL (ref 0.0–0.5)
HCT: 30.3 % — ABNORMAL LOW (ref 34.8–46.6)
Hemoglobin: 9.9 g/dL — ABNORMAL LOW (ref 11.6–15.9)
LYMPHS PCT: 25 %
Lymphs Abs: 0.7 10*3/uL — ABNORMAL LOW (ref 0.9–3.3)
MCH: 28.5 pg (ref 25.1–34.0)
MCHC: 32.8 g/dL (ref 31.5–36.0)
MCV: 87.1 fL (ref 79.5–101.0)
MONO ABS: 0.2 10*3/uL (ref 0.1–0.9)
Monocytes Relative: 7 %
Neutro Abs: 1.8 10*3/uL (ref 1.5–6.5)
Neutrophils Relative %: 63 %
Platelet Count: 122 10*3/uL — ABNORMAL LOW (ref 145–400)
RBC: 3.48 MIL/uL — ABNORMAL LOW (ref 3.70–5.45)
RDW: 19.1 % — AB (ref 11.2–14.5)
WBC Count: 2.8 10*3/uL — ABNORMAL LOW (ref 3.9–10.3)

## 2017-07-02 LAB — CMP (CANCER CENTER ONLY)
ALBUMIN: 4.1 g/dL (ref 3.5–5.0)
ALT: 25 U/L (ref 0–44)
AST: 31 U/L (ref 15–41)
Alkaline Phosphatase: 102 U/L (ref 38–126)
Anion gap: 13 (ref 5–15)
BILIRUBIN TOTAL: 0.6 mg/dL (ref 0.3–1.2)
BUN: 9 mg/dL (ref 6–20)
CO2: 22 mmol/L (ref 22–32)
Calcium: 9.6 mg/dL (ref 8.9–10.3)
Chloride: 104 mmol/L (ref 98–111)
Creatinine: 0.79 mg/dL (ref 0.44–1.00)
GFR, Est AFR Am: >60 mL/min (ref >60)
GFR, Estimated: >60 mL/min (ref >60)
GLUCOSE: 125 mg/dL — AB (ref 70–99)
Potassium: 3.9 mmol/L (ref 3.5–5.1)
Sodium: 139 mmol/L (ref 135–145)
TOTAL PROTEIN: 7 g/dL (ref 6.5–8.1)

## 2017-07-02 LAB — LACTATE DEHYDROGENASE: LDH: 138 U/L (ref 98–192)

## 2017-07-02 MED ORDER — ONDANSETRON HCL 8 MG PO TABS
8.0000 mg | ORAL_TABLET | Freq: Once | ORAL | Status: AC
  Administered 2017-07-02: 8 mg via ORAL

## 2017-07-02 MED ORDER — BORTEZOMIB CHEMO SQ INJECTION 3.5 MG (2.5MG/ML)
1.3000 mg/m2 | Freq: Once | INTRAMUSCULAR | Status: AC
  Administered 2017-07-02: 3 mg via SUBCUTANEOUS
  Filled 2017-07-02: qty 1.2

## 2017-07-02 MED ORDER — ONDANSETRON HCL 8 MG PO TABS
ORAL_TABLET | ORAL | Status: AC
  Filled 2017-07-02: qty 1

## 2017-07-02 NOTE — Patient Instructions (Signed)

## 2017-07-02 NOTE — Progress Notes (Signed)
Per Dr. Julien Nordmann, ok to treat using 06/25/17 CMET.

## 2017-07-03 LAB — IGG, IGA, IGM
IGA: 81 mg/dL — AB (ref 87–352)
IGG (IMMUNOGLOBIN G), SERUM: 396 mg/dL — AB (ref 700–1600)
IgM (Immunoglobulin M), Srm: 573 mg/dL — ABNORMAL HIGH (ref 26–217)

## 2017-07-03 LAB — KAPPA/LAMBDA LIGHT CHAINS
Kappa free light chain: 12.4 mg/L (ref 3.3–19.4)
Kappa, lambda light chain ratio: 0.03 — ABNORMAL LOW (ref 0.26–1.65)
Lambda free light chains: 449.4 mg/L — ABNORMAL HIGH (ref 5.7–26.3)

## 2017-07-03 LAB — BETA 2 MICROGLOBULIN, SERUM: BETA 2 MICROGLOBULIN: 1.9 mg/L (ref 0.6–2.4)

## 2017-07-04 DIAGNOSIS — G4733 Obstructive sleep apnea (adult) (pediatric): Secondary | ICD-10-CM | POA: Diagnosis not present

## 2017-07-09 ENCOUNTER — Other Ambulatory Visit: Payer: Self-pay | Admitting: Internal Medicine

## 2017-07-09 ENCOUNTER — Ambulatory Visit: Payer: BLUE CROSS/BLUE SHIELD

## 2017-07-09 ENCOUNTER — Other Ambulatory Visit: Payer: BLUE CROSS/BLUE SHIELD

## 2017-07-09 DIAGNOSIS — C9000 Multiple myeloma not having achieved remission: Secondary | ICD-10-CM

## 2017-07-10 ENCOUNTER — Other Ambulatory Visit: Payer: Self-pay | Admitting: *Deleted

## 2017-07-10 DIAGNOSIS — C9000 Multiple myeloma not having achieved remission: Secondary | ICD-10-CM

## 2017-07-10 MED ORDER — REVLIMID 25 MG PO CAPS: ORAL_CAPSULE | ORAL | 0 refills | Status: DC

## 2017-07-10 NOTE — Telephone Encounter (Signed)
Revlimid Rx refaxed to [facility]

## 2017-07-16 ENCOUNTER — Other Ambulatory Visit: Payer: BLUE CROSS/BLUE SHIELD

## 2017-07-16 ENCOUNTER — Ambulatory Visit: Payer: BLUE CROSS/BLUE SHIELD

## 2017-07-16 ENCOUNTER — Ambulatory Visit: Payer: BLUE CROSS/BLUE SHIELD | Admitting: Internal Medicine

## 2017-07-16 NOTE — Assessment & Plan Note (Addendum)
This is a very pleasant 57 year old white female with multiple myeloma status post several chemotherapy regimens. The patient has been on treatment with subcutaneous weekly Velcade as well as Revlimid and DecadronStatus post 16 cycles.  She continues to tolerate her treatment well with no concerning complaints except for fatigue and diarrhea while taking Revlimid. She had a recent myeloma panel performed and is here to discuss the results.  The patient was seen with Dr. Julien Nordmann.  Lab results were discussed with the patient which showed improvement in her disease.  We discussed continuing on her current treatment versus giving her a break.  The patient would very much like to take a break for a few months.  We will plan to bring her back in approximately 2 months with a repeat myeloma panel and follow-up 1 week later to discuss the results and treatment options.  The patient has hypokalemia today likely due to her recent diarrhea.  Her diarrhea has resolved.  She was given K-Dur 20 mEq twice a day for 1 week.  She was also encouraged to increase the potassium rich foods in her diet.  The patient was advised to call immediately if she has any concerning symptoms in the interval. All questions were answered. The patient knows to call the clinic with any problems, questions or concerns. We can certainly see the patient much sooner if necessary.

## 2017-07-16 NOTE — Progress Notes (Signed)
[facility] OFFICE PROGRESS NOTE  Keever, Nanine Means, MD [address] [place] [address]  DIAGNOSIS: Plasma cell dyscrasia initially diagnosed as MGUS in September 2010, with additional symptoms suggestive of POEMS syndrome.   PRIOR THERAPY:  1) Velcade 1.3 MG/M2 subcutaneously with Decadron 40 mg by mouth on a weekly basis. First cycle 11/24/2013. She status post 31 weekly doses of treatment. 2) Velcade 1.3 MG/M2 subcutaneously and weekly basis with Decadron 40 mg by mouth weekly. First dose 02/01/2015. Status post 28 cycles. 3) Revlimid 25 mg by mouth daily for 21 days every 4 weeks with weekly Decadron 20 mg. started in 11/27/2015. Status post 3 cycles discontinued secondary to lack of response.  CURRENT THERAPY: Systemic treatment with Velcade 1.3 MG/KG weekly, Revlimid 25 mg by mouth daily for 21 days every 4 weeks in addition to Decadron 20 mg by mouth weekly. First dose 03/06/2016. Status post 16 cycles.   INTERVAL HISTORY: Brenda Conner 57 y.o. female returns for routine follow-up visit by herself.  The patient is feeling fine today and has no specific complaints.  He continues to tolerate treatment fairly well with the exception of fatigue and diarrhea when she is taking Revlimid.  She took her last dose of Revlimid on 07/15/2017.  Her diarrhea has resolved since taking her last dose of Revlimid.  She denies fevers and chills.  Denies chest pain, shortness of breath, cough, hemoptysis.  Denies nausea, vomiting, constipation.  Denies recent weight loss or night sweats.  The patient had a recent myeloma panel is here to discuss the results.  MEDICAL HISTORY: Past Medical History:  Diagnosis Date  . Acid reflux   . Anxiety   . Asthma   . Cancer [facility])    waldenstroms/ macroglobinulemia  . Depression   . Depression   . Diabetes mellitus without complication ([place])   . Dysuria 02/28/2016  . Hypercholesteremia   . Hypertension   . Hypothyroidism   .  Macroglobulinemia ([place])    ? POEMS syndrome  . Multiple myeloma ([place])   . Obesity   . PONV (postoperative nausea and vomiting)   . Sleep apnea    CPAP at bedtime  . Sleep apnea     ALLERGIES:  is allergic to codeine; hydrocodone; lortab [hydrocodone-acetaminophen]; onion; shellfish allergy; and amoxicillin.  MEDICATIONS:  Current Outpatient Medications  Medication Sig Dispense Refill  . acyclovir (ZOVIRAX) 400 MG tablet Take 1 tablet (400 mg total) by mouth 2 (two) times daily. 180 tablet 0  . ALPRAZolam (XANAX) 0.5 MG tablet Take 1 tablet (0.5 mg total) by mouth at bedtime as needed for anxiety. 30 tablet 0  . Azelastine HCl 0.15 % SOLN as needed.    . B-D UF III MINI PEN NEEDLES 31G X 5 MM MISC     . Blood Glucose Monitoring Suppl (ONE TOUCH ULTRA MINI) W/DEVICE KIT See admin instructions. Reported on 01/25/2015  0  . Bortezomib (VELCADE IJ) Inject as directed once a week.    . Cetirizine HCl (ZYRTEC ALLERGY PO) Take by mouth daily.    [name] dexamethasone (DECADRON) 4 MG tablet 5 tablet by mouth every week. Start with the first dose of chemotherapy. 40 tablet 3  . DiphenhydrAMINE HCl (BENADRYL ALLERGY PO) Take by mouth as needed.    [name] EPIPEN 2-PAK 0.3 MG/0.3ML SOAJ injection Reported on 06/21/2015  1  . Fexofenadine HCl (ALLEGRA ALLERGY PO) Take by mouth daily.    . Fluticasone-Salmeterol (ADVAIR) 100-50 MCG/DOSE AEPB Inhale 2 puffs into  the lungs every 12 (twelve) hours.    [name] ibuprofen (ADVIL,MOTRIN) 100 MG tablet Take 100 mg by mouth every 6 (six) hours as needed. Reported on 05/31/2015    . insulin glargine (LANTUS) 100 UNIT/ML injection Inject 8 Units into the skin 2 (two) times a week. Reported on 02/18/2015    . levothyroxine (SYNTHROID, LEVOTHROID) 150 MCG tablet Take 150 mcg by mouth daily before breakfast.    . lisinopril (PRINIVIL,ZESTRIL) 10 MG tablet Take 10 mg by mouth daily.  3  . loperamide (IMODIUM) 2 MG capsule Take 2 mg by mouth as needed for diarrhea or loose stools.    .  meloxicam (MOBIC) 15 MG tablet Take 15 mg daily by mouth.  3  . metFORMIN (GLUCOPHAGE) 1000 MG tablet Take 1,000 mg by mouth 2 (two) times daily.  3  . NOVOLOG FLEXPEN 100 UNIT/ML FlexPen 3 Units daily as needed. Reported on 01/25/2015  6  . omeprazole (PRILOSEC) 40 MG capsule Take 40 mg by mouth 2 (two) times daily.     . ondansetron (ZOFRAN-ODT) 8 MG disintegrating tablet Take 1 tablet (8 mg total) by mouth every 8 (eight) hours as needed. Reported on 05/31/2015 20 tablet 2  . ONE TOUCH ULTRA TEST test strip See admin instructions. Reported on 01/25/2015  11  . potassium chloride SA (K-DUR,KLOR-CON) 20 MEQ tablet Take 1 tablet (20 mEq total) by mouth 2 (two) times daily. 14 tablet 0  . pravastatin (PRAVACHOL) 40 MG tablet Take 40 mg by mouth every evening.     [name] PROAIR HFA 108 (90 Base) MCG/ACT inhaler Reported on 06/21/2015  1  . REVLIMID 25 MG capsule TAKE 1 CAPSULE BY MOUTH ONCE DAILY 21 DAYS ON AND 7 DAYS OFF 21 capsule 0  . sertraline (ZOLOFT) 50 MG tablet TAKE 3 TABLETS BY MOUTH EVERY DAY 270 tablet 3  . verapamil (CALAN-SR) 240 MG CR tablet Take 240 mg by mouth 2 (two) times daily.    [name] warfarin (COUMADIN) 2 MG tablet TAKE 1 TABLET BY MOUTH EVERY DAY 90 tablet 0   No current facility-administered medications for this visit.     SURGICAL HISTORY:  Past Surgical History:  Procedure Laterality Date  . ABLATION  09/2007   HTA and polyp resection  . BACK SURGERY  03/2004   herniation, L4-L5  . Bil Laprascopic knee surgery    . BONE MARROW BIOPSY     2011  . BONE MARROW BIOPSY  4/14  . CHOLECYSTECTOMY    . FOOT SURGERY  1998  . KNEE ARTHROSCOPY W/ MENISCAL REPAIR  10/10 ; 3/11  . LAPAROSCOPIC CHOLECYSTECTOMY  1997  . NASAL SINUS SURGERY  2004  . UTERINE FIBROID EMBOLIZATION      REVIEW OF SYSTEMS:   Review of Systems  Constitutional: Negative for appetite change, chills, fatigue, fever and unexpected weight change.  HENT:   Negative for mouth sores, nosebleeds, sore throat and  trouble swallowing.   Eyes: Negative for eye problems and icterus.  Respiratory: Negative for cough, hemoptysis, shortness of breath and wheezing.   Cardiovascular: Negative for chest pain and leg swelling.  Gastrointestinal: Negative for abdominal pain, constipation, nausea and vomiting. She had diarrhea with her Revlimid.  She has not had any since stopping her Revlimid. Genitourinary: Negative for bladder incontinence, difficulty urinating, dysuria, frequency and hematuria.   Musculoskeletal: Negative for back pain, gait problem, neck pain and neck stiffness.  Skin: Negative for itching and rash.  Neurological: Negative for dizziness, extremity weakness, gait  problem, headaches, light-headedness and seizures.  Hematological: Negative for adenopathy. Does not bruise/bleed easily.  Psychiatric/Behavioral: Negative for confusion, depression and sleep disturbance. The patient is not nervous/anxious.     PHYSICAL EXAMINATION:  Blood pressure (!) 150/68, pulse 66, temperature 98.3 F (36.8 C), temperature source Oral, resp. rate 18, height 5' 6.75" (1.695 m), weight 257 lb 3.2 oz (116.7 kg), SpO2 100 %.  ECOG PERFORMANCE STATUS: 1 - Symptomatic but completely ambulatory  Physical Exam  Constitutional: Oriented to person, place, and time and well-developed, well-nourished, and in no distress. No distress.  HENT:  Head: Normocephalic and atraumatic.  Mouth/Throat: Oropharynx is clear and moist. No oropharyngeal exudate.  Eyes: Conjunctivae are normal. Right eye exhibits no discharge. Left eye exhibits no discharge. No scleral icterus.  Neck: Normal range of motion. Neck supple.  Cardiovascular: Normal rate, regular rhythm, normal heart sounds and intact distal pulses.   Pulmonary/Chest: Effort normal and breath sounds normal. No respiratory distress. No wheezes. No rales.  Abdominal: Soft. Bowel sounds are normal. Exhibits no distension and no mass. There is no tenderness.  Musculoskeletal:  Normal range of motion. Exhibits no edema.  Lymphadenopathy:    No cervical adenopathy.  Neurological: Alert and oriented to person, place, and time. Exhibits normal muscle tone. Gait normal. Coordination normal.  Skin: Skin is warm and dry. No rash noted. Not diaphoretic. No erythema. No pallor.  Psychiatric: Mood, memory and judgment normal.  Vitals reviewed.  LABORATORY DATA: Lab Results  Component Value Date   WBC 2.1 (L) 07/17/2017   HGB 9.0 (L) 07/17/2017   HCT 27.8 (L) 07/17/2017   MCV 88.8 07/17/2017   PLT 105 (L) 07/17/2017      Chemistry      Component Value Date/Time   NA 141 07/17/2017 0818   NA 138 11/14/2016 0824   K 2.8 (LL) 07/17/2017 0818   K 4.3 11/14/2016 0824   CL 103 07/17/2017 0818   CL 104 04/07/2012 1415   CO2 27 07/17/2017 0818   CO2 23 11/14/2016 0824   BUN 6 07/17/2017 0818   BUN 9.1 11/14/2016 0824   CREATININE 0.76 07/17/2017 0818   CREATININE 0.7 11/14/2016 0824      Component Value Date/Time   CALCIUM 8.7 (L) 07/17/2017 0818   CALCIUM 9.6 11/14/2016 0824   ALKPHOS 121 07/17/2017 0818   ALKPHOS 94 11/14/2016 0824   AST 30 07/17/2017 0818   AST 28 11/14/2016 0824   ALT 32 07/17/2017 0818   ALT 24 11/14/2016 0824   BILITOT 0.6 07/17/2017 0818   BILITOT 0.41 11/14/2016 0824       RADIOGRAPHIC STUDIES:  No results found.   ASSESSMENT/PLAN:  Multiple myeloma not having achieved remission [facility]) This is a very pleasant 57 year old white female with multiple myeloma status post several chemotherapy regimens. The patient has been on treatment with subcutaneous weekly Velcade as well as Revlimid and DecadronStatus post 16 cycles.  She continues to tolerate her treatment well with no concerning complaints except for fatigue and diarrhea while taking Revlimid. She had a recent myeloma panel performed and is here to discuss the results.  The patient was seen with Dr. Julien Nordmann.  Lab results were discussed with the patient which showed  improvement in her disease.  We discussed continuing on her current treatment versus giving her a break.  The patient would very much like to take a break for a few months.  We will plan to bring her back in approximately 2 months with  a repeat myeloma panel and follow-up 1 week later to discuss the results and treatment options.  The patient has hypokalemia today likely due to her recent diarrhea.  Her diarrhea has resolved.  She was given K-Dur 20 mEq twice a day for 1 week.  She was also encouraged to increase the potassium rich foods in her diet.  The patient was advised to call immediately if she has any concerning symptoms in the interval. All questions were answered. The patient knows to call the clinic with any problems, questions or concerns. We can certainly see the patient much sooner if necessary.   Orders Placed This Encounter  Procedures  . CBC with Differential (Cancer Center Only)    Standing Status:   Future    Standing Expiration Date:   07/18/2018  . CMP ([facility] only)    Standing Status:   Future    Standing Expiration Date:   07/18/2018  . Kappa/lambda light chains    Standing Status:   Future    Standing Expiration Date:   07/17/2018  . IgG, IgA, IgM    Standing Status:   Future    Standing Expiration Date:   07/17/2018  . Beta 2 microglobulin, serum    Standing Status:   Future    Standing Expiration Date:   07/17/2018  . Lactate dehydrogenase    Standing Status:   Future    Standing Expiration Date:   07/18/2018   Mikey Bussing, DNP, AGPCNP-BC, AOCNP 07/17/17  ADDENDUM: Hematology/Oncology Attending: I had a face-to-face encounter with the patient today.  I recommended her care plan.  This is a very pleasant 57 years old white female with history of multiple myeloma.  She has been undergoing treatment with Revlimid, Velcade and Decadron.  She has been tolerating her treatment well except for fatigue. The patient had myeloma panel performed recently.  Her labs  showed no concerning findings for disease progression.  I discussed the lab results with the patient today.  She is interested in taking a break of chemotherapy for the summertime.  I agreed with her request and recommended for her to come back for follow-up visit in 2 months for evaluation with repeat myeloma panel. For the hypokalemia she was given prescription for potassium chloride and she was encouraged to increase her potassium rich diet. The patient was advised to call immediately if she has any concerning symptoms in the interval.  Disclaimer: This note was dictated with voice recognition software. Similar sounding words can inadvertently be transcribed and may be missed upon review. Eilleen Kempf, MD 07/17/17

## 2017-07-17 ENCOUNTER — Inpatient Hospital Stay: Payer: BLUE CROSS/BLUE SHIELD | Attending: Internal Medicine

## 2017-07-17 ENCOUNTER — Encounter: Payer: Self-pay | Admitting: Oncology

## 2017-07-17 ENCOUNTER — Inpatient Hospital Stay (HOSPITAL_BASED_OUTPATIENT_CLINIC_OR_DEPARTMENT_OTHER): Payer: BLUE CROSS/BLUE SHIELD | Admitting: Oncology

## 2017-07-17 ENCOUNTER — Inpatient Hospital Stay: Payer: BLUE CROSS/BLUE SHIELD

## 2017-07-17 ENCOUNTER — Telehealth: Payer: Self-pay

## 2017-07-17 VITALS — BP 150/68 | HR 66 | Temp 98.3°F | Resp 18 | Ht 66.75 in | Wt 257.2 lb

## 2017-07-17 DIAGNOSIS — E119 Type 2 diabetes mellitus without complications: Secondary | ICD-10-CM

## 2017-07-17 DIAGNOSIS — Z79899 Other long term (current) drug therapy: Secondary | ICD-10-CM | POA: Insufficient documentation

## 2017-07-17 DIAGNOSIS — Z7984 Long term (current) use of oral hypoglycemic drugs: Secondary | ICD-10-CM | POA: Diagnosis not present

## 2017-07-17 DIAGNOSIS — Z9221 Personal history of antineoplastic chemotherapy: Secondary | ICD-10-CM

## 2017-07-17 DIAGNOSIS — Z5111 Encounter for antineoplastic chemotherapy: Secondary | ICD-10-CM

## 2017-07-17 DIAGNOSIS — C9000 Multiple myeloma not having achieved remission: Secondary | ICD-10-CM | POA: Diagnosis not present

## 2017-07-17 DIAGNOSIS — Z794 Long term (current) use of insulin: Secondary | ICD-10-CM | POA: Diagnosis not present

## 2017-07-17 DIAGNOSIS — F419 Anxiety disorder, unspecified: Secondary | ICD-10-CM | POA: Diagnosis not present

## 2017-07-17 DIAGNOSIS — Z7901 Long term (current) use of anticoagulants: Secondary | ICD-10-CM | POA: Insufficient documentation

## 2017-07-17 DIAGNOSIS — E876 Hypokalemia: Secondary | ICD-10-CM | POA: Insufficient documentation

## 2017-07-17 DIAGNOSIS — R5383 Other fatigue: Secondary | ICD-10-CM | POA: Diagnosis not present

## 2017-07-17 LAB — CBC WITH DIFFERENTIAL (CANCER CENTER ONLY)
BASOS ABS: 0 10*3/uL (ref 0.0–0.1)
BASOS PCT: 1 %
EOS ABS: 0.1 10*3/uL (ref 0.0–0.5)
Eosinophils Relative: 7 %
HCT: 27.8 % — ABNORMAL LOW (ref 34.8–46.6)
Hemoglobin: 9 g/dL — ABNORMAL LOW (ref 11.6–15.9)
Lymphocytes Relative: 26 %
Lymphs Abs: 0.6 10*3/uL — ABNORMAL LOW (ref 0.9–3.3)
MCH: 28.8 pg (ref 25.1–34.0)
MCHC: 32.4 g/dL (ref 31.5–36.0)
MCV: 88.8 fL (ref 79.5–101.0)
MONO ABS: 0.2 10*3/uL (ref 0.1–0.9)
MONOS PCT: 10 %
NEUTROS PCT: 56 %
Neutro Abs: 1.2 10*3/uL — ABNORMAL LOW (ref 1.5–6.5)
Platelet Count: 105 10*3/uL — ABNORMAL LOW (ref 145–400)
RBC: 3.13 MIL/uL — ABNORMAL LOW (ref 3.70–5.45)
RDW: 16.9 % — AB (ref 11.2–14.5)
WBC Count: 2.1 10*3/uL — ABNORMAL LOW (ref 3.9–10.3)

## 2017-07-17 LAB — CMP (CANCER CENTER ONLY)
ALK PHOS: 121 U/L (ref 38–126)
ALT: 32 U/L (ref 0–44)
ANION GAP: 11 (ref 5–15)
AST: 30 U/L (ref 15–41)
Albumin: 3.6 g/dL (ref 3.5–5.0)
BILIRUBIN TOTAL: 0.6 mg/dL (ref 0.3–1.2)
BUN: 6 mg/dL (ref 6–20)
CALCIUM: 8.7 mg/dL — AB (ref 8.9–10.3)
CO2: 27 mmol/L (ref 22–32)
CREATININE: 0.76 mg/dL (ref 0.44–1.00)
Chloride: 103 mmol/L (ref 98–111)
GFR, Estimated: >60 mL/min (ref >60)
GLUCOSE: 114 mg/dL — AB (ref 70–99)
Potassium: 2.8 mmol/L — CL (ref 3.5–5.1)
Sodium: 141 mmol/L (ref 135–145)
TOTAL PROTEIN: 6.3 g/dL — AB (ref 6.5–8.1)

## 2017-07-17 MED ORDER — POTASSIUM CHLORIDE CRYS ER 20 MEQ PO TBCR: 20.0000 meq | EXTENDED_RELEASE_TABLET | Freq: Two times a day (BID) | ORAL | 0 refills | Status: DC

## 2017-07-17 NOTE — Telephone Encounter (Signed)
Printed avs and calender of upcoming appointment. Per 7/10 los.  

## 2017-07-23 ENCOUNTER — Other Ambulatory Visit: Payer: BLUE CROSS/BLUE SHIELD

## 2017-07-23 ENCOUNTER — Ambulatory Visit: Payer: BLUE CROSS/BLUE SHIELD

## 2017-07-30 ENCOUNTER — Other Ambulatory Visit: Payer: BLUE CROSS/BLUE SHIELD

## 2017-07-30 ENCOUNTER — Ambulatory Visit: Payer: BLUE CROSS/BLUE SHIELD

## 2017-08-05 DIAGNOSIS — E039 Hypothyroidism, unspecified: Secondary | ICD-10-CM | POA: Diagnosis not present

## 2017-08-05 DIAGNOSIS — E7800 Pure hypercholesterolemia, unspecified: Secondary | ICD-10-CM | POA: Diagnosis not present

## 2017-08-05 DIAGNOSIS — G609 Hereditary and idiopathic neuropathy, unspecified: Secondary | ICD-10-CM | POA: Diagnosis not present

## 2017-08-05 DIAGNOSIS — E1165 Type 2 diabetes mellitus with hyperglycemia: Secondary | ICD-10-CM | POA: Diagnosis not present

## 2017-08-06 ENCOUNTER — Other Ambulatory Visit: Payer: BLUE CROSS/BLUE SHIELD

## 2017-08-06 ENCOUNTER — Ambulatory Visit: Payer: BLUE CROSS/BLUE SHIELD

## 2017-08-13 ENCOUNTER — Ambulatory Visit: Payer: BLUE CROSS/BLUE SHIELD

## 2017-08-13 ENCOUNTER — Other Ambulatory Visit: Payer: BLUE CROSS/BLUE SHIELD

## 2017-08-13 ENCOUNTER — Ambulatory Visit: Payer: BLUE CROSS/BLUE SHIELD | Admitting: Internal Medicine

## 2017-08-19 ENCOUNTER — Encounter: Payer: Self-pay | Admitting: Obstetrics & Gynecology

## 2017-08-19 ENCOUNTER — Ambulatory Visit: Payer: BLUE CROSS/BLUE SHIELD | Admitting: Obstetrics & Gynecology

## 2017-08-19 ENCOUNTER — Other Ambulatory Visit: Payer: Self-pay

## 2017-08-19 ENCOUNTER — Other Ambulatory Visit (HOSPITAL_COMMUNITY)
Admission: RE | Admit: 2017-08-19 | Discharge: 2017-08-19 | Disposition: A | Discharge status: Home / Self Care | Payer: BLUE CROSS/BLUE SHIELD | Source: Ambulatory Visit | Attending: Obstetrics & Gynecology | Admitting: Obstetrics & Gynecology

## 2017-08-19 VITALS — BP 140/80 | HR 88 | Resp 16 | Ht 67.0 in | Wt 253.8 lb

## 2017-08-19 DIAGNOSIS — Z01411 Encounter for gynecological examination (general) (routine) with abnormal findings: Secondary | ICD-10-CM

## 2017-08-19 DIAGNOSIS — Z124 Encounter for screening for malignant neoplasm of cervix: Secondary | ICD-10-CM | POA: Diagnosis not present

## 2017-08-19 NOTE — Progress Notes (Signed)
57 y.o. G0P0000 SingleCaucasianF here for annual exam.  H/O multiple myeloma.  On break from treatment right now.    Denies vaginal bleeding.    Patient's last menstrual period was 01/14/2016 (approximate).          Sexually active: No.  The current method of family planning is abstinence and post menopausal status.    Exercising: No.   Smoker:  no  Health Maintenance: Pap:  06/05/16 Neg    02/18/15 neg. HR HPV:neg  History of abnormal Pap:  no MMG:  09/15/14 BIRADS1:neg  Colonoscopy:  09/06/08 Normal  BMD:   09/19/16 Normal  TDaP:  2016 Pneumonia vaccine(s):  No Shingrix:   No Hep C testing: done Screening Labs: PCP   reports that she has never smoked. She has never used smokeless tobacco. She reports that she does not drink alcohol or use drugs.  Past Medical History:  Diagnosis Date  . Acid reflux   . Anxiety   . Asthma   . Cancer [facility])    waldenstroms/ macroglobinulemia  . Depression   . Depression   . Diabetes mellitus without complication ([place])   . Dysuria 02/28/2016  . Hypercholesteremia   . Hypertension   . Hypothyroidism   . Macroglobulinemia ([place])    ? POEMS syndrome  . Multiple myeloma ([place])   . Obesity   . PONV (postoperative nausea and vomiting)   . Sleep apnea    CPAP at bedtime  . Sleep apnea     Past Surgical History:  Procedure Laterality Date  . ABLATION  09/2007   HTA and polyp resection  . BACK SURGERY  03/2004   herniation, L4-L5  . Bil Laprascopic knee surgery    . BONE MARROW BIOPSY     2011  . BONE MARROW BIOPSY  4/14  . CHOLECYSTECTOMY    . FOOT SURGERY  1998  . KNEE ARTHROSCOPY W/ MENISCAL REPAIR  10/10 ; 3/11  . LAPAROSCOPIC CHOLECYSTECTOMY  1997  . NASAL SINUS SURGERY  2004  . UTERINE FIBROID EMBOLIZATION      Current Outpatient Medications  Medication Sig Dispense Refill  . acyclovir (ZOVIRAX) 400 MG tablet Take 1 tablet (400 mg total) by mouth 2 (two) times daily. 180 tablet 0  . ALPRAZolam (XANAX) 0.5 MG tablet Take 1 tablet  (0.5 mg total) by mouth at bedtime as needed for anxiety. 30 tablet 0  . Azelastine HCl 0.15 % SOLN as needed.    . Blood Glucose Monitoring Suppl (ONE TOUCH ULTRA MINI) W/DEVICE KIT See admin instructions. Reported on 01/25/2015  0  . Cetirizine HCl (ZYRTEC ALLERGY PO) Take by mouth daily.    . DiphenhydrAMINE HCl (BENADRYL ALLERGY PO) Take by mouth as needed.    [name] EPIPEN 2-PAK 0.3 MG/0.3ML SOAJ injection Reported on 06/21/2015  1  . Fexofenadine HCl (ALLEGRA ALLERGY PO) Take by mouth daily.    . Fluticasone-Salmeterol (ADVAIR) 100-50 MCG/DOSE AEPB Inhale 2 puffs into the lungs every 12 (twelve) hours.    [name] ibuprofen (ADVIL,MOTRIN) 100 MG tablet Take 100 mg by mouth every 6 (six) hours as needed. Reported on 05/31/2015    . levothyroxine (SYNTHROID, LEVOTHROID) 150 MCG tablet Take 150 mcg by mouth daily before breakfast.    . lisinopril (PRINIVIL,ZESTRIL) 10 MG tablet Take 10 mg by mouth daily.  3  . loperamide (IMODIUM) 2 MG capsule Take 2 mg by mouth as needed for diarrhea or loose stools.    . metFORMIN (GLUCOPHAGE) 1000 MG tablet Take 1,000 mg  by mouth 2 (two) times daily.  3  . omeprazole (PRILOSEC) 40 MG capsule Take 40 mg by mouth 2 (two) times daily.     . ondansetron (ZOFRAN-ODT) 8 MG disintegrating tablet Take 1 tablet (8 mg total) by mouth every 8 (eight) hours as needed. Reported on 05/31/2015 20 tablet 2  . pravastatin (PRAVACHOL) 40 MG tablet Take 40 mg by mouth every evening.     [name] PROAIR HFA 108 (90 Base) MCG/ACT inhaler Reported on 06/21/2015  1  . sertraline (ZOLOFT) 50 MG tablet TAKE 3 TABLETS BY MOUTH EVERY DAY 270 tablet 3  . verapamil (CALAN-SR) 240 MG CR tablet Take 240 mg by mouth 2 (two) times daily.     No current facility-administered medications for this visit.     Family History  Problem Relation Age of Onset  . Heart disease Mother        heart cath  . Asthma Mother   . Endometriosis Mother     Review of Systems  HENT: Positive for congestion.    Gastrointestinal: Positive for diarrhea.  Musculoskeletal: Positive for myalgias.  Psychiatric/Behavioral: Positive for depression.  All other systems reviewed and are negative.   Exam:   BP 140/80 (BP Location: Right Arm, Patient Position: Sitting, Cuff Size: Large)   Pulse 88   Resp 16   Ht '5\' 7"'$  (1.702 m)   Wt 253 lb 12.8 oz (115.1 kg)   LMP 01/14/2016 (Approximate)   BMI 39.75 kg/m    Height: '5\' 7"'$  (170.2 cm)  Ht Readings from Last 3 Encounters:  08/19/17 '5\' 7"'$  (1.702 m)  07/17/17 5' 6.75" (1.695 m)  06/18/17 5' 6.75" (1.695 m)    General appearance: alert, cooperative and appears stated age Head: Normocephalic, without obvious abnormality, atraumatic Neck: no adenopathy, supple, symmetrical, trachea midline and thyroid normal to inspection and palpation Lungs: clear to auscultation bilaterally Breasts: normal appearance, no masses or tenderness Heart: regular rate and rhythm Abdomen: soft, non-tender; bowel sounds normal; no masses,  no organomegaly Extremities: extremities normal, atraumatic, no cyanosis or edema Skin: Skin color, texture, turgor normal. No rashes or lesions Lymph nodes: Cervical, supraclavicular, and axillary nodes normal. No abnormal inguinal nodes palpated Neurologic: Grossly normal   Pelvic: External genitalia:  no lesions              Urethra:  normal appearing urethra with no masses, tenderness or lesions              Bartholins and Skenes: normal                 Vagina: normal appearing vagina with normal color and discharge, no lesions              Cervix: no lesions              Pap taken: Yes.   Bimanual Exam:  Uterus:  normal size, contour, position, consistency, mobility, non-tender              Adnexa: normal adnexa and no mass, fullness, tenderness               Rectovaginal: Confirms               Anus:  normal sphincter tone, no lesions  Chaperone was present for exam.  A:  Well Woman with normal exam PMP, no HRT H/o HTA  9/09 H/O elevated lipids H/O multiple myeloma, followed by DR. Mohamed Hypertension Anxiety Anemia due treatment  P:  Mammogram guidelines reviewed.  Release signed for most recent MMG from [facility] pap smear obtained today.  Doing yearly due to immunosuppressants. Does not need RF for Xanax and Zoloft at this time.  Will call when she does need. Does not need blood work obtained today return annually or prn

## 2017-08-20 ENCOUNTER — Ambulatory Visit: Payer: BLUE CROSS/BLUE SHIELD

## 2017-08-20 ENCOUNTER — Other Ambulatory Visit: Payer: Self-pay | Admitting: Internal Medicine

## 2017-08-20 ENCOUNTER — Other Ambulatory Visit: Payer: BLUE CROSS/BLUE SHIELD

## 2017-08-20 DIAGNOSIS — C9000 Multiple myeloma not having achieved remission: Secondary | ICD-10-CM

## 2017-08-20 LAB — CYTOLOGY - PAP: Diagnosis: NEGATIVE

## 2017-08-23 ENCOUNTER — Ambulatory Visit: Payer: BLUE CROSS/BLUE SHIELD | Admitting: Obstetrics & Gynecology

## 2017-09-04 ENCOUNTER — Inpatient Hospital Stay: Payer: BLUE CROSS/BLUE SHIELD | Attending: Internal Medicine

## 2017-09-04 DIAGNOSIS — R530 Neoplastic (malignant) related fatigue: Secondary | ICD-10-CM | POA: Diagnosis not present

## 2017-09-04 DIAGNOSIS — Z79899 Other long term (current) drug therapy: Secondary | ICD-10-CM | POA: Diagnosis not present

## 2017-09-04 DIAGNOSIS — C9000 Multiple myeloma not having achieved remission: Secondary | ICD-10-CM | POA: Insufficient documentation

## 2017-09-04 DIAGNOSIS — M79606 Pain in leg, unspecified: Secondary | ICD-10-CM | POA: Diagnosis not present

## 2017-09-04 DIAGNOSIS — Z7984 Long term (current) use of oral hypoglycemic drugs: Secondary | ICD-10-CM | POA: Diagnosis not present

## 2017-09-04 DIAGNOSIS — E119 Type 2 diabetes mellitus without complications: Secondary | ICD-10-CM | POA: Insufficient documentation

## 2017-09-04 DIAGNOSIS — Z7901 Long term (current) use of anticoagulants: Secondary | ICD-10-CM | POA: Diagnosis not present

## 2017-09-04 LAB — CMP (CANCER CENTER ONLY)
ALBUMIN: 4 g/dL (ref 3.5–5.0)
ALT: 32 U/L (ref 0–44)
ANION GAP: 11 (ref 5–15)
AST: 40 U/L (ref 15–41)
Alkaline Phosphatase: 123 U/L (ref 38–126)
BUN: 9 mg/dL (ref 6–20)
CALCIUM: 9.9 mg/dL (ref 8.9–10.3)
CO2: 24 mmol/L (ref 22–32)
Chloride: 102 mmol/L (ref 98–111)
Creatinine: 0.77 mg/dL (ref 0.44–1.00)
GFR, Est AFR Am: >60 mL/min (ref >60)
GFR, Estimated: >60 mL/min (ref >60)
GLUCOSE: 120 mg/dL — AB (ref 70–99)
Potassium: 4.4 mmol/L (ref 3.5–5.1)
Sodium: 137 mmol/L (ref 135–145)
Total Bilirubin: 0.4 mg/dL (ref 0.3–1.2)
Total Protein: 7.2 g/dL (ref 6.5–8.1)

## 2017-09-04 LAB — CBC WITH DIFFERENTIAL (CANCER CENTER ONLY)
BASOS ABS: 0 10*3/uL (ref 0.0–0.1)
BASOS PCT: 1 %
EOS ABS: 0.1 10*3/uL (ref 0.0–0.5)
EOS PCT: 2 %
HCT: 31.3 % — ABNORMAL LOW (ref 34.8–46.6)
Hemoglobin: 10.4 g/dL — ABNORMAL LOW (ref 11.6–15.9)
Lymphocytes Relative: 22 %
Lymphs Abs: 0.8 10*3/uL — ABNORMAL LOW (ref 0.9–3.3)
MCH: 29.3 pg (ref 25.1–34.0)
MCHC: 33.1 g/dL (ref 31.5–36.0)
MCV: 88.3 fL (ref 79.5–101.0)
MONO ABS: 0.3 10*3/uL (ref 0.1–0.9)
Monocytes Relative: 8 %
NEUTROS ABS: 2.6 10*3/uL (ref 1.5–6.5)
Neutrophils Relative %: 67 %
PLATELETS: 143 10*3/uL — AB (ref 145–400)
RBC: 3.55 MIL/uL — ABNORMAL LOW (ref 3.70–5.45)
RDW: 17.7 % — AB (ref 11.2–14.5)
WBC Count: 3.9 10*3/uL (ref 3.9–10.3)

## 2017-09-04 LAB — LACTATE DEHYDROGENASE: LDH: 147 U/L (ref 98–192)

## 2017-09-05 LAB — IGG, IGA, IGM
IGA: 89 mg/dL (ref 87–352)
IGG (IMMUNOGLOBIN G), SERUM: 493 mg/dL — AB (ref 700–1600)
IgM (Immunoglobulin M), Srm: 615 mg/dL — ABNORMAL HIGH (ref 26–217)

## 2017-09-05 LAB — KAPPA/LAMBDA LIGHT CHAINS
KAPPA FREE LGHT CHN: 9.7 mg/L (ref 3.3–19.4)
Kappa, lambda light chain ratio: 0.01 — ABNORMAL LOW (ref 0.26–1.65)
Lambda free light chains: 759 mg/L — ABNORMAL HIGH (ref 5.7–26.3)

## 2017-09-05 LAB — BETA 2 MICROGLOBULIN, SERUM: BETA 2 MICROGLOBULIN: 1.8 mg/L (ref 0.6–2.4)

## 2017-09-06 ENCOUNTER — Encounter: Payer: Self-pay | Admitting: Obstetrics & Gynecology

## 2017-09-11 ENCOUNTER — Other Ambulatory Visit: Payer: Self-pay | Admitting: Medical Oncology

## 2017-09-11 ENCOUNTER — Inpatient Hospital Stay: Payer: BLUE CROSS/BLUE SHIELD | Attending: Internal Medicine | Admitting: Internal Medicine

## 2017-09-11 ENCOUNTER — Telehealth: Payer: Self-pay | Admitting: Internal Medicine

## 2017-09-11 ENCOUNTER — Encounter: Payer: Self-pay | Admitting: Internal Medicine

## 2017-09-11 VITALS — BP 140/67 | HR 72 | Temp 98.0°F | Resp 18 | Ht 67.0 in | Wt 261.0 lb

## 2017-09-11 DIAGNOSIS — C9000 Multiple myeloma not having achieved remission: Secondary | ICD-10-CM | POA: Insufficient documentation

## 2017-09-11 DIAGNOSIS — R530 Neoplastic (malignant) related fatigue: Secondary | ICD-10-CM

## 2017-09-11 DIAGNOSIS — M79606 Pain in leg, unspecified: Secondary | ICD-10-CM | POA: Diagnosis not present

## 2017-09-11 DIAGNOSIS — Z79899 Other long term (current) drug therapy: Secondary | ICD-10-CM | POA: Diagnosis not present

## 2017-09-11 DIAGNOSIS — Z7901 Long term (current) use of anticoagulants: Secondary | ICD-10-CM | POA: Insufficient documentation

## 2017-09-11 DIAGNOSIS — E119 Type 2 diabetes mellitus without complications: Secondary | ICD-10-CM | POA: Diagnosis not present

## 2017-09-11 DIAGNOSIS — Z5112 Encounter for antineoplastic immunotherapy: Secondary | ICD-10-CM | POA: Insufficient documentation

## 2017-09-11 DIAGNOSIS — Z7984 Long term (current) use of oral hypoglycemic drugs: Secondary | ICD-10-CM | POA: Diagnosis not present

## 2017-09-11 DIAGNOSIS — Z5111 Encounter for antineoplastic chemotherapy: Secondary | ICD-10-CM

## 2017-09-11 MED ORDER — ACYCLOVIR 400 MG PO TABS: 400.0000 mg | ORAL_TABLET | Freq: Two times a day (BID) | ORAL | 0 refills | Status: DC

## 2017-09-11 MED ORDER — LENALIDOMIDE 25 MG PO CAPS: 25.0000 mg | ORAL_CAPSULE | Freq: Every day | ORAL | 0 refills | Status: DC

## 2017-09-11 NOTE — Progress Notes (Signed)
.[facility] [phone]   [phone]  OFFICE PROGRESS NOTE  [name], MD [place] [address]  DIAGNOSIS: Plasma cell dyscrasia initially diagnosed as MGUS in September 2010, with additional symptoms suggestive of POEMS syndrome.   PRIOR THERAPY:  1) Velcade 1.3 MG/M2 subcutaneously with Decadron 40 mg by mouth on a weekly basis. First cycle 11/24/2013. She status post 31 weekly doses of treatment. 2) Velcade 1.3 MG/M2 subcutaneously and weekly basis with Decadron 40 mg by mouth weekly. First dose 02/01/2015. Status post 28 cycles. 3) Revlimid 25 mg by mouth daily for 21 days every 4 weeks with weekly Decadron 20 mg. started in 11/27/2015. Status post 3 cycles discontinued secondary to lack of response.  CURRENT THERAPY: Systemic treatment with Velcade 1.3 MG/KG weekly, Revlimid 25 mg by mouth daily for 21 days every 4 weeks in addition to Decadron 20 mg by mouth weekly. First dose 03/06/2016. Status post 16 cycles.   INTERVAL HISTORY: Brenda Conner 57 y.o. female returns to the clinic today for follow-up visit.  The patient is feeling much better today except for aching pain in her legs.  She mentions the pain gets worse when she is off treatment.  She denied having any current chest pain, shortness of breath, cough or hemoptysis.  She denied having any fever or chills.  She has no nausea, vomiting, diarrhea or constipation.  The patient denied having any recent weight loss or night sweats.  She has been off treatment for the last 2 months.  She had repeat myeloma panel performed recently and she is here for evaluation and discussion of her lab results.   MEDICAL HISTORY: Past Medical History:  Diagnosis Date  . Acid reflux   . Anxiety   . Asthma   . Cancer [facility])    waldenstroms/ macroglobinulemia  . Depression   . Depression   . Diabetes mellitus without complication ([place])   . Dysuria 02/28/2016  .  Hypercholesteremia   . Hypertension   . Hypothyroidism   . Macroglobulinemia ([place])    ? POEMS syndrome  . Multiple myeloma ([place])   . Obesity   . PONV (postoperative nausea and vomiting)   . Sleep apnea    CPAP at bedtime  . Sleep apnea     ALLERGIES:  is allergic to codeine; hydrocodone; lortab [hydrocodone-acetaminophen]; onion; shellfish allergy; and amoxicillin.  MEDICATIONS:  Current Outpatient Medications  Medication Sig Dispense Refill  . acyclovir (ZOVIRAX) 400 MG tablet Take 1 tablet (400 mg total) by mouth 2 (two) times daily. 180 tablet 0  . ALPRAZolam (XANAX) 0.5 MG tablet Take 1 tablet (0.5 mg total) by mouth at bedtime as needed for anxiety. 30 tablet 0  . Azelastine HCl 0.15 % SOLN as needed.    . Blood Glucose Monitoring Suppl (ONE TOUCH ULTRA MINI) W/DEVICE KIT See admin instructions. Reported on 01/25/2015  0  . Cetirizine HCl (ZYRTEC ALLERGY PO) Take by mouth daily.    . DiphenhydrAMINE HCl (BENADRYL ALLERGY PO) Take by mouth as needed.    [name] Fexofenadine HCl (ALLEGRA ALLERGY PO) Take by mouth daily.    . Fluticasone-Salmeterol (ADVAIR) 100-50 MCG/DOSE AEPB Inhale 2 puffs into the lungs every 12 (twelve) hours.    [name] ibuprofen (ADVIL,MOTRIN) 100 MG tablet Take 100 mg by mouth every 6 (six) hours as needed. Reported on 05/31/2015    . levothyroxine (SYNTHROID, LEVOTHROID) 150 MCG tablet Take 150  mcg by mouth daily before breakfast.    . lisinopril (PRINIVIL,ZESTRIL) 10 MG tablet Take 10 mg by mouth daily.  3  . metFORMIN (GLUCOPHAGE) 1000 MG tablet Take 1,000 mg by mouth 2 (two) times daily.  3  . omeprazole (PRILOSEC) 40 MG capsule Take 40 mg by mouth 2 (two) times daily.     . ondansetron (ZOFRAN-ODT) 8 MG disintegrating tablet Take 1 tablet (8 mg total) by mouth every 8 (eight) hours as needed. Reported on 05/31/2015 20 tablet 2  . pravastatin (PRAVACHOL) 40 MG tablet Take 40 mg by mouth every evening.     [name] PROAIR HFA 108 (90 Base) MCG/ACT inhaler Reported on  06/21/2015  1  . sertraline (ZOLOFT) 50 MG tablet TAKE 3 TABLETS BY MOUTH EVERY DAY 270 tablet 3  . verapamil (CALAN-SR) 240 MG CR tablet Take 240 mg by mouth 2 (two) times daily.    [name] warfarin (COUMADIN) 2 MG tablet TAKE 1 TABLET BY MOUTH EVERY DAY 90 tablet 0  . EPIPEN 2-PAK 0.3 MG/0.3ML SOAJ injection Reported on 06/21/2015  1  . loperamide (IMODIUM) 2 MG capsule Take 2 mg by mouth as needed for diarrhea or loose stools.     No current facility-administered medications for this visit.     SURGICAL HISTORY:  Past Surgical History:  Procedure Laterality Date  . ABLATION  09/2007   HTA and polyp resection  . BACK SURGERY  03/2004   herniation, L4-L5  . Bil Laprascopic knee surgery    . BONE MARROW BIOPSY     2011  . BONE MARROW BIOPSY  4/14  . CHOLECYSTECTOMY    . FOOT SURGERY  1998  . KNEE ARTHROSCOPY W/ MENISCAL REPAIR  10/10 ; 3/11  . LAPAROSCOPIC CHOLECYSTECTOMY  1997  . NASAL SINUS SURGERY  2004  . UTERINE FIBROID EMBOLIZATION      REVIEW OF SYSTEMS:  Constitutional: positive for fatigue Eyes: negative Ears, nose, mouth, throat, and face: negative Respiratory: negative Cardiovascular: negative Gastrointestinal: negative Genitourinary:negative Integument/breast: negative Hematologic/lymphatic: negative Musculoskeletal:negative Neurological: negative Behavioral/Psych: negative Endocrine: negative Allergic/Immunologic: negative   PHYSICAL EXAMINATION: General appearance: alert, cooperative, fatigued and no distress Head: Normocephalic, without obvious abnormality, atraumatic Neck: no adenopathy Lymph nodes: Cervical, supraclavicular, and axillary nodes normal. Resp: clear to auscultation bilaterally Back: symmetric, no curvature. ROM normal. No CVA tenderness. Cardio: regular rate and rhythm, S1, S2 normal, no murmur, click, rub or gallop GI: soft, non-tender; bowel sounds normal; no masses,  no organomegaly Extremities: extremities normal, atraumatic, no cyanosis or  edema Neurologic: Alert and oriented X 3, normal strength and tone. Normal symmetric reflexes. Normal coordination and gait  ECOG PERFORMANCE STATUS: 1 - Symptomatic but completely ambulatory  Blood pressure 140/67, pulse 72, temperature 98 F (36.7 C), temperature source Oral, resp. rate 18, height 5' 7"  (1.702 m), weight 261 lb (118.4 kg), SpO2 100 %.  LABORATORY DATA: Lab Results  Component Value Date   WBC 3.9 09/04/2017   HGB 10.4 (L) 09/04/2017   HCT 31.3 (L) 09/04/2017   MCV 88.3 09/04/2017   PLT 143 (L) 09/04/2017      Chemistry      Component Value Date/Time   NA 137 09/04/2017 0808   NA 138 11/14/2016 0824   K 4.4 09/04/2017 0808   K 4.3 11/14/2016 0824   CL 102 09/04/2017 0808   CL 104 04/07/2012 1415   CO2 24 09/04/2017 0808   CO2 23 11/14/2016 0824   BUN 9 09/04/2017 0808   BUN  9.1 11/14/2016 0824   CREATININE 0.77 09/04/2017 0808   CREATININE 0.7 11/14/2016 0824      Component Value Date/Time   CALCIUM 9.9 09/04/2017 0808   CALCIUM 9.6 11/14/2016 0824   ALKPHOS 123 09/04/2017 0808   ALKPHOS 94 11/14/2016 0824   AST 40 09/04/2017 0808   AST 28 11/14/2016 0824   ALT 32 09/04/2017 0808   ALT 24 11/14/2016 0824   BILITOT 0.4 09/04/2017 0808   BILITOT 0.41 11/14/2016 0824      ASSESSMENT AND PLAN:  This is a very pleasant 57 years old white female with multiple myeloma status post several chemotherapy regimens. The patient has been on treatment with subcutaneous weekly Velcade as well as Revlimid and DecadronStatus post 16 cycles.  The patient has been in observation for the last 2 months. She had repeat myeloma panel performed recently.  I personally reviewed the lab results with the patient.  It showed further increase in the free lambda light chains. I recommended for the patient to resume her treatment with subcutaneous Velcade, Revlimid and Decadron again.  She would like to hold her treatment for the next 3 weeks until she returns back from  [place] to attend her father's 80th birthday. She is expected to start cycle #17 on October 01, 2017. I will give her a refill of acyclovir. She will come back for follow-up visit in 5 weeks for evaluation and management of any adverse effect of her treatment. She was advised to call immediately if she has any concerning symptoms in the interval. All questions were answered. The patient knows to call the clinic with any problems, questions or concerns. We can certainly see the patient much sooner if necessary.  Disclaimer: This note was dictated with voice recognition software. Similar sounding words can inadvertently be transcribed and may not be corrected upon review.

## 2017-09-11 NOTE — Telephone Encounter (Signed)
Appts scheduled AVS declined / Calendar printed per 9/4 los °

## 2017-09-24 ENCOUNTER — Other Ambulatory Visit: Payer: Self-pay | Admitting: Medical Oncology

## 2017-09-24 NOTE — Progress Notes (Signed)
Faxed [facility] [facility] request completed by [facility].

## 2017-10-02 ENCOUNTER — Inpatient Hospital Stay: Payer: BLUE CROSS/BLUE SHIELD

## 2017-10-02 ENCOUNTER — Telehealth: Payer: Self-pay | Admitting: Oncology

## 2017-10-02 ENCOUNTER — Encounter: Payer: Self-pay | Admitting: Oncology

## 2017-10-02 ENCOUNTER — Inpatient Hospital Stay (HOSPITAL_BASED_OUTPATIENT_CLINIC_OR_DEPARTMENT_OTHER): Payer: BLUE CROSS/BLUE SHIELD | Admitting: Oncology

## 2017-10-02 VITALS — BP 136/63 | HR 82 | Temp 98.5°F | Resp 18 | Ht 67.0 in | Wt 262.9 lb

## 2017-10-02 DIAGNOSIS — Z5112 Encounter for antineoplastic immunotherapy: Secondary | ICD-10-CM | POA: Diagnosis not present

## 2017-10-02 DIAGNOSIS — R530 Neoplastic (malignant) related fatigue: Secondary | ICD-10-CM | POA: Diagnosis not present

## 2017-10-02 DIAGNOSIS — Z5111 Encounter for antineoplastic chemotherapy: Secondary | ICD-10-CM

## 2017-10-02 DIAGNOSIS — Z7984 Long term (current) use of oral hypoglycemic drugs: Secondary | ICD-10-CM | POA: Diagnosis not present

## 2017-10-02 DIAGNOSIS — C9000 Multiple myeloma not having achieved remission: Secondary | ICD-10-CM | POA: Diagnosis not present

## 2017-10-02 DIAGNOSIS — M79606 Pain in leg, unspecified: Secondary | ICD-10-CM | POA: Diagnosis not present

## 2017-10-02 DIAGNOSIS — Z7901 Long term (current) use of anticoagulants: Secondary | ICD-10-CM | POA: Diagnosis not present

## 2017-10-02 DIAGNOSIS — Z79899 Other long term (current) drug therapy: Secondary | ICD-10-CM | POA: Diagnosis not present

## 2017-10-02 DIAGNOSIS — E119 Type 2 diabetes mellitus without complications: Secondary | ICD-10-CM | POA: Diagnosis not present

## 2017-10-02 LAB — CBC WITH DIFFERENTIAL (CANCER CENTER ONLY)
BASOS ABS: 0 10*3/uL (ref 0.0–0.1)
BASOS PCT: 1 %
Eosinophils Absolute: 0.1 10*3/uL (ref 0.0–0.5)
Eosinophils Relative: 2 %
HEMATOCRIT: 31.3 % — AB (ref 34.8–46.6)
HEMOGLOBIN: 10.4 g/dL — AB (ref 11.6–15.9)
LYMPHS PCT: 16 %
Lymphs Abs: 0.7 10*3/uL — ABNORMAL LOW (ref 0.9–3.3)
MCH: 29.2 pg (ref 25.1–34.0)
MCHC: 33.2 g/dL (ref 31.5–36.0)
MCV: 87.9 fL (ref 79.5–101.0)
MONO ABS: 0.4 10*3/uL (ref 0.1–0.9)
Monocytes Relative: 8 %
NEUTROS ABS: 3.5 10*3/uL (ref 1.5–6.5)
Neutrophils Relative %: 73 %
Platelet Count: 161 10*3/uL (ref 145–400)
RBC: 3.56 MIL/uL — ABNORMAL LOW (ref 3.70–5.45)
RDW: 16.3 % — ABNORMAL HIGH (ref 11.2–14.5)
WBC: 4.8 10*3/uL (ref 3.9–10.3)

## 2017-10-02 LAB — CMP (CANCER CENTER ONLY)
ALT: 33 U/L (ref 0–44)
AST: 35 U/L (ref 15–41)
Albumin: 4 g/dL (ref 3.5–5.0)
Alkaline Phosphatase: 144 U/L — ABNORMAL HIGH (ref 38–126)
Anion gap: 10 (ref 5–15)
BILIRUBIN TOTAL: 0.3 mg/dL (ref 0.3–1.2)
BUN: 11 mg/dL (ref 6–20)
CO2: 23 mmol/L (ref 22–32)
CREATININE: 0.77 mg/dL (ref 0.44–1.00)
Calcium: 9.7 mg/dL (ref 8.9–10.3)
Chloride: 106 mmol/L (ref 98–111)
GFR, Est AFR Am: >60 mL/min (ref >60)
Glucose, Bld: 169 mg/dL — ABNORMAL HIGH (ref 70–99)
Potassium: 4.4 mmol/L (ref 3.5–5.1)
Sodium: 139 mmol/L (ref 135–145)
TOTAL PROTEIN: 7.3 g/dL (ref 6.5–8.1)

## 2017-10-02 MED ORDER — ONDANSETRON HCL 8 MG PO TABS
ORAL_TABLET | ORAL | Status: AC
  Filled 2017-10-02: qty 1

## 2017-10-02 MED ORDER — BORTEZOMIB CHEMO SQ INJECTION 3.5 MG (2.5MG/ML)
1.3000 mg/m2 | Freq: Once | INTRAMUSCULAR | Status: AC
  Administered 2017-10-02: 3 mg via SUBCUTANEOUS
  Filled 2017-10-02: qty 1.2

## 2017-10-02 MED ORDER — ONDANSETRON HCL 8 MG PO TABS
8.0000 mg | ORAL_TABLET | Freq: Once | ORAL | Status: AC
  Administered 2017-10-02: 8 mg via ORAL

## 2017-10-02 NOTE — Patient Instructions (Signed)
[facility] Discharge Instructions for Patients Receiving Chemotherapy  Today you received the following chemotherapy agents Velcade.  To help prevent nausea and vomiting after your treatment, we encourage you to take your nausea medication as directed.  If you develop nausea and vomiting that is not controlled by your nausea medication, call the clinic.   BELOW ARE SYMPTOMS THAT SHOULD BE REPORTED IMMEDIATELY:  *FEVER GREATER THAN 100.5 F  *CHILLS WITH OR WITHOUT FEVER  NAUSEA AND VOMITING THAT IS NOT CONTROLLED WITH YOUR NAUSEA MEDICATION  *UNUSUAL SHORTNESS OF BREATH  *UNUSUAL BRUISING OR BLEEDING  TENDERNESS IN MOUTH AND THROAT WITH OR WITHOUT PRESENCE OF ULCERS  *URINARY PROBLEMS  *BOWEL PROBLEMS  UNUSUAL RASH Items with * indicate a potential emergency and should be followed up as soon as possible.  Feel free to call the clinic should you have any questions or concerns. The clinic phone number is [phone].  Please show the CHEMO ALERT CARD at check-in to the Emergency Department and triage nurse.   

## 2017-10-02 NOTE — Assessment & Plan Note (Signed)
This is a very pleasant 57 years old white female with multiple myeloma status post several chemotherapy regimens. The patient has been on treatment with subcutaneous weekly Velcade as well as Revlimid and DecadronStatus post 16 cycles.  The patient has been in observation for the last 2 months. She had repeat myeloma panel performed recently.  I personally reviewed the lab results with the patient.  It showed further increase in the free lambda light chains. I recommended for the patient to resume her treatment with subcutaneous Velcade, Revlimid and Decadron again.  She would like to hold her treatment for the next 3 weeks until she returns back from [place] to attend her father's 80th birthday. She is expected to start cycle #17 on October 01, 2017. I will give her a refill of acyclovir. She will come back for follow-up visit in 5 weeks for evaluation and management of any adverse effect of her treatment. She was advised to call immediately if she has any concerning symptoms in the interval. All questions were answered. The patient knows to call the clinic with any problems, questions or concerns. We can certainly see the patient much sooner if necessary.

## 2017-10-02 NOTE — Progress Notes (Signed)
Brenda Conner was checked in to see me today.  This visit was scheduled erroneously and she was not due to be seen until 1 week after starting treatment.  I spoke briefly with the patient and advised her to proceed with her treatment today as previously planned.  I will see her next week as previously scheduled.  The patient was not charged for this visit.

## 2017-10-02 NOTE — Telephone Encounter (Signed)
Gave pt avs and calendar  °

## 2017-10-09 ENCOUNTER — Inpatient Hospital Stay (HOSPITAL_BASED_OUTPATIENT_CLINIC_OR_DEPARTMENT_OTHER): Payer: BLUE CROSS/BLUE SHIELD | Admitting: Oncology

## 2017-10-09 ENCOUNTER — Inpatient Hospital Stay: Payer: BLUE CROSS/BLUE SHIELD | Attending: Internal Medicine

## 2017-10-09 ENCOUNTER — Inpatient Hospital Stay: Payer: BLUE CROSS/BLUE SHIELD

## 2017-10-09 ENCOUNTER — Encounter: Payer: Self-pay | Admitting: Oncology

## 2017-10-09 ENCOUNTER — Telehealth: Payer: Self-pay | Admitting: Internal Medicine

## 2017-10-09 VITALS — BP 128/58 | HR 83 | Temp 98.4°F | Resp 18 | Ht 67.0 in | Wt 260.3 lb

## 2017-10-09 DIAGNOSIS — Z5112 Encounter for antineoplastic immunotherapy: Secondary | ICD-10-CM | POA: Insufficient documentation

## 2017-10-09 DIAGNOSIS — Z7901 Long term (current) use of anticoagulants: Secondary | ICD-10-CM | POA: Insufficient documentation

## 2017-10-09 DIAGNOSIS — E119 Type 2 diabetes mellitus without complications: Secondary | ICD-10-CM

## 2017-10-09 DIAGNOSIS — E7800 Pure hypercholesterolemia, unspecified: Secondary | ICD-10-CM | POA: Diagnosis not present

## 2017-10-09 DIAGNOSIS — C9000 Multiple myeloma not having achieved remission: Secondary | ICD-10-CM

## 2017-10-09 DIAGNOSIS — R110 Nausea: Secondary | ICD-10-CM | POA: Insufficient documentation

## 2017-10-09 DIAGNOSIS — K521 Toxic gastroenteritis and colitis: Secondary | ICD-10-CM | POA: Diagnosis not present

## 2017-10-09 DIAGNOSIS — Z79899 Other long term (current) drug therapy: Secondary | ICD-10-CM | POA: Insufficient documentation

## 2017-10-09 DIAGNOSIS — Z7984 Long term (current) use of oral hypoglycemic drugs: Secondary | ICD-10-CM | POA: Insufficient documentation

## 2017-10-09 DIAGNOSIS — F419 Anxiety disorder, unspecified: Secondary | ICD-10-CM | POA: Diagnosis not present

## 2017-10-09 LAB — CMP (CANCER CENTER ONLY)
ALBUMIN: 3.9 g/dL (ref 3.5–5.0)
ALT: 39 U/L (ref 0–44)
AST: 42 U/L — AB (ref 15–41)
Alkaline Phosphatase: 126 U/L (ref 38–126)
Anion gap: 14 (ref 5–15)
BUN: 8 mg/dL (ref 6–20)
CHLORIDE: 103 mmol/L (ref 98–111)
CO2: 21 mmol/L — AB (ref 22–32)
CREATININE: 0.82 mg/dL (ref 0.44–1.00)
Calcium: 9.4 mg/dL (ref 8.9–10.3)
GFR, Est AFR Am: >60 mL/min (ref >60)
GLUCOSE: 219 mg/dL — AB (ref 70–99)
Potassium: 3.7 mmol/L (ref 3.5–5.1)
Sodium: 138 mmol/L (ref 135–145)
Total Bilirubin: 0.3 mg/dL (ref 0.3–1.2)
Total Protein: 7.3 g/dL (ref 6.5–8.1)

## 2017-10-09 LAB — CBC WITH DIFFERENTIAL (CANCER CENTER ONLY)
BASOS ABS: 0 10*3/uL (ref 0.0–0.1)
Basophils Relative: 1 %
EOS PCT: 3 %
Eosinophils Absolute: 0.1 10*3/uL (ref 0.0–0.5)
HEMATOCRIT: 33.6 % — AB (ref 34.8–46.6)
Hemoglobin: 11.1 g/dL — ABNORMAL LOW (ref 11.6–15.9)
Lymphocytes Relative: 15 %
Lymphs Abs: 0.8 10*3/uL — ABNORMAL LOW (ref 0.9–3.3)
MCH: 29.2 pg (ref 25.1–34.0)
MCHC: 33 g/dL (ref 31.5–36.0)
MCV: 88.4 fL (ref 79.5–101.0)
Monocytes Absolute: 0.3 10*3/uL (ref 0.1–0.9)
Monocytes Relative: 7 %
NEUTROS ABS: 4 10*3/uL (ref 1.5–6.5)
Neutrophils Relative %: 74 %
PLATELETS: 158 10*3/uL (ref 145–400)
RBC: 3.8 MIL/uL (ref 3.70–5.45)
RDW: 16.3 % — ABNORMAL HIGH (ref 11.2–14.5)
WBC: 5.2 10*3/uL (ref 3.9–10.3)

## 2017-10-09 MED ORDER — ONDANSETRON HCL 8 MG PO TABS
ORAL_TABLET | ORAL | Status: AC
  Filled 2017-10-09: qty 1

## 2017-10-09 MED ORDER — BORTEZOMIB CHEMO SQ INJECTION 3.5 MG (2.5MG/ML)
1.3000 mg/m2 | Freq: Once | INTRAMUSCULAR | Status: AC
  Administered 2017-10-09: 3 mg via SUBCUTANEOUS
  Filled 2017-10-09: qty 1.2

## 2017-10-09 MED ORDER — ONDANSETRON HCL 8 MG PO TABS
8.0000 mg | ORAL_TABLET | Freq: Once | ORAL | Status: AC
  Administered 2017-10-09: 8 mg via ORAL

## 2017-10-09 NOTE — Patient Instructions (Signed)
[facility] Discharge Instructions for Patients Receiving Chemotherapy  Today you received the following chemotherapy agents: Bortezomib (Velcade)  To help prevent nausea and vomiting after your treatment, we encourage you to take your nausea medication as directed.    If you develop nausea and vomiting that is not controlled by your nausea medication, call the clinic.   BELOW ARE SYMPTOMS THAT SHOULD BE REPORTED IMMEDIATELY:  *FEVER GREATER THAN 100.5 F  *CHILLS WITH OR WITHOUT FEVER  NAUSEA AND VOMITING THAT IS NOT CONTROLLED WITH YOUR NAUSEA MEDICATION  *UNUSUAL SHORTNESS OF BREATH  *UNUSUAL BRUISING OR BLEEDING  TENDERNESS IN MOUTH AND THROAT WITH OR WITHOUT PRESENCE OF ULCERS  *URINARY PROBLEMS  *BOWEL PROBLEMS  UNUSUAL RASH Items with * indicate a potential emergency and should be followed up as soon as possible.  Feel free to call the clinic should you have any questions or concerns. The clinic phone number is [phone].  Please show the CHEMO ALERT CARD at check-in to the Emergency Department and triage nurse.   

## 2017-10-09 NOTE — Assessment & Plan Note (Signed)
This is a very pleasant 57 year old white female with multiple myeloma status post several chemotherapy regimens. The patient has been on treatment with subcutaneous weekly Velcade as well as Revlimid and DecadronStatus post 16 cycles.  The patient duration for 2 months. She had repeat myeloma panel performed which showed further increase in the free lambda light chains. She resumed treatment with subcutaneous Velcade, Revlimid and Decadron last week.  She tolerated her treatment well overall with no concerning complaints except for mild diarrhea. Recommend for her to proceed with her treatment today as scheduled. We will follow-up for lab and chemotherapy weekly with exception of the week of 10/14 since she will be out of town.  She will have a follow-up visit in approximately 4 weeks.  She was advised to call immediately if she has any concerning symptoms in the interval. All questions were answered. The patient knows to call the clinic with any problems, questions or concerns. We can certainly see the patient much sooner if necessary.

## 2017-10-09 NOTE — Telephone Encounter (Signed)
Appts scheduled AVS/Calendar printed per 10/2 los °

## 2017-10-09 NOTE — Progress Notes (Signed)
[facility] OFFICE PROGRESS NOTE  Brenda Dickens, MD [place] [address]  DIAGNOSIS: Plasma cell dyscrasia initially diagnosed as MGUS in September 2010, with additional symptoms suggestive of POEMS syndrome.   PRIOR THERAPY:  1) Velcade 1.3 MG/M2 subcutaneously with Decadron 40 mg by mouth on a weekly basis. First cycle 11/24/2013. She status post 31 weekly doses of treatment. 2) Velcade 1.3 MG/M2 subcutaneously and weekly basis with Decadron 40 mg by mouth weekly. First dose 02/01/2015. Status post 28 cycles. 3) Revlimid 25 mg by mouth daily for 21 days every 4 weeks with weekly Decadron 20 mg. started in 11/27/2015. Status post 3 cycles discontinued secondary to lack of response.  CURRENT THERAPY: Systemic treatment with Velcade 1.3 MG/KG weekly, Revlimid 25 mg by mouth daily for 21 days every 4 weeks in addition to Decadron 20 mg by mouth weekly. First dose 03/06/2016. Status post 16 cycles.  INTERVAL HISTORY: Brenda Conner 57 y.o. female returns for routine follow-up visit by herself.  Patient is feeling fine today and has no specific complaints.  She resumed treatment with Velcade, Revlimid, dexamethasone last week.  She tolerated the treatment well overall with the exception of mild diarrhea.  Reports that this is manageable.  She denies fevers and chills.  Denies chest pain, shortness breath, cough, hemoptysis.  Denies nausea, vomiting, constipation.  Denies recent weight loss or night sweats.  The patient is here for evaluation prior to her next dose of chemotherapy.  MEDICAL HISTORY: Past Medical History:  Diagnosis Date  . Acid reflux   . Anxiety   . Asthma   . Cancer [facility])    waldenstroms/ macroglobinulemia  . Depression   . Depression   . Diabetes mellitus without complication ([place])   . Dysuria 02/28/2016  . Hypercholesteremia   . Hypertension   . Hypothyroidism   . Macroglobulinemia ([place])    ? POEMS syndrome  . Multiple  myeloma ([place])   . Obesity   . PONV (postoperative nausea and vomiting)   . Sleep apnea    CPAP at bedtime  . Sleep apnea     ALLERGIES:  is allergic to codeine; hydrocodone; lortab [hydrocodone-acetaminophen]; onion; shellfish allergy; and amoxicillin.  MEDICATIONS:  Current Outpatient Medications  Medication Sig Dispense Refill  . acyclovir (ZOVIRAX) 400 MG tablet Take 1 tablet (400 mg total) by mouth 2 (two) times daily. 180 tablet 0  . ALPRAZolam (XANAX) 0.5 MG tablet Take 1 tablet (0.5 mg total) by mouth at bedtime as needed for anxiety. 30 tablet 0  . Azelastine HCl 0.15 % SOLN as needed.    . Blood Glucose Monitoring Suppl (ONE TOUCH ULTRA MINI) W/DEVICE KIT See admin instructions. Reported on 01/25/2015  0  . Cetirizine HCl (ZYRTEC ALLERGY PO) Take by mouth daily.    [name] dexamethasone (DECADRON) 4 MG tablet Take 4 mg by mouth once a week. Patient takes 5 tablets 1 day week prior to [facility] appointment.    . DiphenhydrAMINE HCl (BENADRYL ALLERGY PO) Take by mouth as needed.    [name] EPIPEN 2-PAK 0.3 MG/0.3ML SOAJ injection Reported on 06/21/2015  1  . Fexofenadine HCl (ALLEGRA ALLERGY PO) Take by mouth daily.    . Fluticasone-Salmeterol (ADVAIR) 100-50 MCG/DOSE AEPB Inhale 2 puffs into the lungs every 12 (twelve) hours.    [name] ibuprofen (ADVIL,MOTRIN) 100 MG tablet Take 100 mg by mouth every 6 (six) hours as needed. Reported on 05/31/2015    . lenalidomide (REVLIMID) 25 MG  capsule Take 1 capsule (25 mg total) by mouth daily. Take one capsule by mouth daily for 21 days then rest for 7 days every 4 weeks 21 capsule 0  . levothyroxine (SYNTHROID, LEVOTHROID) 150 MCG tablet Take 150 mcg by mouth daily before breakfast.    . lisinopril (PRINIVIL,ZESTRIL) 10 MG tablet Take 10 mg by mouth daily.  3  . loperamide (IMODIUM) 2 MG capsule Take 2 mg by mouth as needed for diarrhea or loose stools.    . metFORMIN (GLUCOPHAGE) 1000 MG tablet Take 1,000 mg by mouth 2 (two) times daily.  3  . omeprazole  (PRILOSEC) 40 MG capsule Take 40 mg by mouth 2 (two) times daily.     . ondansetron (ZOFRAN-ODT) 8 MG disintegrating tablet Take 1 tablet (8 mg total) by mouth every 8 (eight) hours as needed. Reported on 05/31/2015 20 tablet 2  . pravastatin (PRAVACHOL) 40 MG tablet Take 40 mg by mouth every evening.     [name] PROAIR HFA 108 (90 Base) MCG/ACT inhaler Reported on 06/21/2015  1  . sertraline (ZOLOFT) 50 MG tablet TAKE 3 TABLETS BY MOUTH EVERY DAY 270 tablet 3  . verapamil (CALAN-SR) 240 MG CR tablet Take 240 mg by mouth 2 (two) times daily.    [name] warfarin (COUMADIN) 2 MG tablet TAKE 1 TABLET BY MOUTH EVERY DAY 90 tablet 0   No current facility-administered medications for this visit.    Facility-Administered Medications Ordered in Other Visits  Medication Dose Route Frequency Provider Last Rate Last Dose  . bortezomib SQ (VELCADE) chemo injection 3 mg  1.3 mg/m2 (Treatment Plan Recorded) Subcutaneous Once Curt Bears, MD        SURGICAL HISTORY:  Past Surgical History:  Procedure Laterality Date  . ABLATION  09/2007   HTA and polyp resection  . BACK SURGERY  03/2004   herniation, L4-L5  . Bil Laprascopic knee surgery    . BONE MARROW BIOPSY     2011  . BONE MARROW BIOPSY  4/14  . CHOLECYSTECTOMY    . FOOT SURGERY  1998  . KNEE ARTHROSCOPY W/ MENISCAL REPAIR  10/10 ; 3/11  . LAPAROSCOPIC CHOLECYSTECTOMY  1997  . NASAL SINUS SURGERY  2004  . UTERINE FIBROID EMBOLIZATION      REVIEW OF SYSTEMS:   Review of Systems  Constitutional: Negative for appetite change, chills, fatigue, fever and unexpected weight change.  HENT:   Negative for mouth sores, nosebleeds, sore throat and trouble swallowing.   Eyes: Negative for eye problems and icterus.  Respiratory: Negative for cough, hemoptysis, shortness of breath and wheezing.   Cardiovascular: Negative for chest pain and leg swelling.  Gastrointestinal: Negative for abdominal pain, constipation, nausea and vomiting.  Positive for mild  diarrhea. Genitourinary: Negative for bladder incontinence, difficulty urinating, dysuria, frequency and hematuria.   Musculoskeletal: Negative for back pain, gait problem, neck pain and neck stiffness.  Skin: Negative for itching and rash.  Neurological: Negative for dizziness, extremity weakness, gait problem, headaches, light-headedness and seizures.  Hematological: Negative for adenopathy. Does not bruise/bleed easily.  Psychiatric/Behavioral: Negative for confusion, depression and sleep disturbance. The patient is not nervous/anxious.     PHYSICAL EXAMINATION:  Blood pressure (!) 128/58, pulse 83, temperature 98.4 F (36.9 C), temperature source Oral, resp. rate 18, height _0  (1.702 m), weight 260 lb 4.8 oz (118.1 kg), SpO2 99 %.  ECOG PERFORMANCE STATUS: 1 - Symptomatic but completely ambulatory  Physical Exam  Constitutional: Oriented to person, place, and time and  well-developed, well-nourished, and in no distress. No distress.  HENT:  Head: Normocephalic and atraumatic.  Mouth/Throat: Oropharynx is clear and moist. No oropharyngeal exudate.  Eyes: Conjunctivae are normal. Right eye exhibits no discharge. Left eye exhibits no discharge. No scleral icterus.  Neck: Normal range of motion. Neck supple.  Cardiovascular: Normal rate, regular rhythm, normal heart sounds and intact distal pulses.   Pulmonary/Chest: Effort normal and breath sounds normal. No respiratory distress. No wheezes. No rales.  Abdominal: Soft. Bowel sounds are normal. Exhibits no distension and no mass. There is no tenderness.  Musculoskeletal: Normal range of motion. Exhibits no edema.  Lymphadenopathy:    No cervical adenopathy.  Neurological: Alert and oriented to person, place, and time. Exhibits normal muscle tone. Gait normal. Coordination normal.  Skin: Skin is warm and dry. No rash noted. Not diaphoretic. No erythema. No pallor.  Psychiatric: Mood, memory and judgment normal.  Vitals  reviewed.  LABORATORY DATA: Lab Results  Component Value Date   WBC 5.2 10/09/2017   HGB 11.1 (L) 10/09/2017   HCT 33.6 (L) 10/09/2017   MCV 88.4 10/09/2017   PLT 158 10/09/2017      Chemistry      Component Value Date/Time   NA 138 10/09/2017 1356   NA 138 11/14/2016 0824   K 3.7 10/09/2017 1356   K 4.3 11/14/2016 0824   CL 103 10/09/2017 1356   CL 104 04/07/2012 1415   CO2 21 (L) 10/09/2017 1356   CO2 23 11/14/2016 0824   BUN 8 10/09/2017 1356   BUN 9.1 11/14/2016 0824   CREATININE 0.82 10/09/2017 1356   CREATININE 0.7 11/14/2016 0824      Component Value Date/Time   CALCIUM 9.4 10/09/2017 1356   CALCIUM 9.6 11/14/2016 0824   ALKPHOS 126 10/09/2017 1356   ALKPHOS 94 11/14/2016 0824   AST 42 (H) 10/09/2017 1356   AST 28 11/14/2016 0824   ALT 39 10/09/2017 1356   ALT 24 11/14/2016 0824   BILITOT 0.3 10/09/2017 1356   BILITOT 0.41 11/14/2016 0824       RADIOGRAPHIC STUDIES:  No results found.   ASSESSMENT/PLAN:  Multiple myeloma This is a very pleasant 57 year old white female with multiple myeloma status post several chemotherapy regimens. The patient has been on treatment with subcutaneous weekly Velcade as well as Revlimid and DecadronStatus post 16 cycles.  The patient duration for 2 months. She had repeat myeloma panel performed which showed further increase in the free lambda light chains. She resumed treatment with subcutaneous Velcade, Revlimid and Decadron last week.  She tolerated her treatment well overall with no concerning complaints except for mild diarrhea. Recommend for her to proceed with her treatment today as scheduled. We will follow-up for lab and chemotherapy weekly with exception of the week of 10/14 since she will be out of town.  She will have a follow-up visit in approximately 4 weeks.  She was advised to call immediately if she has any concerning symptoms in the interval. All questions were answered. The patient knows to call the  clinic with any problems, questions or concerns. We can certainly see the patient much sooner if necessary.   No orders of the defined types were placed in this encounter.    Mikey Bussing, DNP, AGPCNP-BC, AOCNP 10/09/17

## 2017-10-14 ENCOUNTER — Inpatient Hospital Stay: Payer: BLUE CROSS/BLUE SHIELD

## 2017-10-14 VITALS — BP 136/80 | HR 71 | Temp 98.3°F | Resp 16

## 2017-10-14 DIAGNOSIS — E7800 Pure hypercholesterolemia, unspecified: Secondary | ICD-10-CM | POA: Diagnosis not present

## 2017-10-14 DIAGNOSIS — Z7901 Long term (current) use of anticoagulants: Secondary | ICD-10-CM | POA: Diagnosis not present

## 2017-10-14 DIAGNOSIS — Z79899 Other long term (current) drug therapy: Secondary | ICD-10-CM | POA: Diagnosis not present

## 2017-10-14 DIAGNOSIS — R110 Nausea: Secondary | ICD-10-CM | POA: Diagnosis not present

## 2017-10-14 DIAGNOSIS — K521 Toxic gastroenteritis and colitis: Secondary | ICD-10-CM | POA: Diagnosis not present

## 2017-10-14 DIAGNOSIS — F419 Anxiety disorder, unspecified: Secondary | ICD-10-CM | POA: Diagnosis not present

## 2017-10-14 DIAGNOSIS — Z7984 Long term (current) use of oral hypoglycemic drugs: Secondary | ICD-10-CM | POA: Diagnosis not present

## 2017-10-14 DIAGNOSIS — Z5112 Encounter for antineoplastic immunotherapy: Secondary | ICD-10-CM | POA: Diagnosis not present

## 2017-10-14 DIAGNOSIS — C9000 Multiple myeloma not having achieved remission: Secondary | ICD-10-CM

## 2017-10-14 DIAGNOSIS — E119 Type 2 diabetes mellitus without complications: Secondary | ICD-10-CM | POA: Diagnosis not present

## 2017-10-14 LAB — CMP (CANCER CENTER ONLY)
ALK PHOS: 114 U/L (ref 38–126)
ALT: 41 U/L (ref 0–44)
ANION GAP: 12 (ref 5–15)
AST: 32 U/L (ref 15–41)
Albumin: 3.7 g/dL (ref 3.5–5.0)
BUN: 9 mg/dL (ref 6–20)
CHLORIDE: 105 mmol/L (ref 98–111)
CO2: 22 mmol/L (ref 22–32)
CREATININE: 0.79 mg/dL (ref 0.44–1.00)
Calcium: 9.4 mg/dL (ref 8.9–10.3)
GFR, Estimated: >60 mL/min (ref >60)
Glucose, Bld: 142 mg/dL — ABNORMAL HIGH (ref 70–99)
Potassium: 3.6 mmol/L (ref 3.5–5.1)
Sodium: 139 mmol/L (ref 135–145)
Total Bilirubin: 0.4 mg/dL (ref 0.3–1.2)
Total Protein: 6.9 g/dL (ref 6.5–8.1)

## 2017-10-14 LAB — CBC WITH DIFFERENTIAL (CANCER CENTER ONLY)
BASOS PCT: 1 %
Basophils Absolute: 0 10*3/uL (ref 0.0–0.1)
EOS ABS: 0.1 10*3/uL (ref 0.0–0.5)
Eosinophils Relative: 2 %
HCT: 32.4 % — ABNORMAL LOW (ref 34.8–46.6)
HEMOGLOBIN: 10.8 g/dL — AB (ref 11.6–15.9)
Lymphocytes Relative: 14 %
Lymphs Abs: 0.8 10*3/uL — ABNORMAL LOW (ref 0.9–3.3)
MCH: 29.3 pg (ref 25.1–34.0)
MCHC: 33.3 g/dL (ref 31.5–36.0)
MCV: 87.9 fL (ref 79.5–101.0)
Monocytes Absolute: 0.5 10*3/uL (ref 0.1–0.9)
Monocytes Relative: 10 %
NEUTROS ABS: 3.9 10*3/uL (ref 1.5–6.5)
NEUTROS PCT: 73 %
Platelet Count: 126 10*3/uL — ABNORMAL LOW (ref 145–400)
RBC: 3.68 MIL/uL — ABNORMAL LOW (ref 3.70–5.45)
RDW: 16.2 % — ABNORMAL HIGH (ref 11.2–14.5)
WBC: 5.4 10*3/uL (ref 3.9–10.3)

## 2017-10-14 MED ORDER — ONDANSETRON HCL 8 MG PO TABS
8.0000 mg | ORAL_TABLET | Freq: Once | ORAL | Status: AC
  Administered 2017-10-14: 8 mg via ORAL

## 2017-10-14 MED ORDER — BORTEZOMIB CHEMO SQ INJECTION 3.5 MG (2.5MG/ML)
1.3000 mg/m2 | Freq: Once | INTRAMUSCULAR | Status: AC
  Administered 2017-10-14: 3 mg via SUBCUTANEOUS
  Filled 2017-10-14: qty 1.2

## 2017-10-14 MED ORDER — ONDANSETRON HCL 8 MG PO TABS
ORAL_TABLET | ORAL | Status: AC
  Filled 2017-10-14: qty 1

## 2017-10-14 NOTE — Patient Instructions (Signed)

## 2017-10-15 ENCOUNTER — Other Ambulatory Visit: Payer: BLUE CROSS/BLUE SHIELD

## 2017-10-15 ENCOUNTER — Other Ambulatory Visit: Payer: Self-pay | Admitting: Internal Medicine

## 2017-10-15 DIAGNOSIS — C9000 Multiple myeloma not having achieved remission: Secondary | ICD-10-CM

## 2017-10-18 MED ORDER — LENALIDOMIDE 25 MG PO CAPS: ORAL_CAPSULE | ORAL | 0 refills | Status: DC

## 2017-10-18 NOTE — Addendum Note (Signed)
Addended by: Ardeen Garland on: 10/18/2017 09:12 AM   Modules accepted: Orders

## 2017-10-28 ENCOUNTER — Inpatient Hospital Stay: Payer: BLUE CROSS/BLUE SHIELD

## 2017-10-28 VITALS — BP 118/65 | HR 60 | Temp 98.5°F | Resp 18 | Ht 67.0 in | Wt 257.5 lb

## 2017-10-28 DIAGNOSIS — Z7901 Long term (current) use of anticoagulants: Secondary | ICD-10-CM | POA: Diagnosis not present

## 2017-10-28 DIAGNOSIS — F419 Anxiety disorder, unspecified: Secondary | ICD-10-CM | POA: Diagnosis not present

## 2017-10-28 DIAGNOSIS — Z79899 Other long term (current) drug therapy: Secondary | ICD-10-CM | POA: Diagnosis not present

## 2017-10-28 DIAGNOSIS — Z7984 Long term (current) use of oral hypoglycemic drugs: Secondary | ICD-10-CM | POA: Diagnosis not present

## 2017-10-28 DIAGNOSIS — E7800 Pure hypercholesterolemia, unspecified: Secondary | ICD-10-CM | POA: Diagnosis not present

## 2017-10-28 DIAGNOSIS — C9000 Multiple myeloma not having achieved remission: Secondary | ICD-10-CM | POA: Diagnosis not present

## 2017-10-28 DIAGNOSIS — Z5112 Encounter for antineoplastic immunotherapy: Secondary | ICD-10-CM | POA: Diagnosis not present

## 2017-10-28 DIAGNOSIS — K521 Toxic gastroenteritis and colitis: Secondary | ICD-10-CM | POA: Diagnosis not present

## 2017-10-28 DIAGNOSIS — R110 Nausea: Secondary | ICD-10-CM | POA: Diagnosis not present

## 2017-10-28 DIAGNOSIS — E119 Type 2 diabetes mellitus without complications: Secondary | ICD-10-CM | POA: Diagnosis not present

## 2017-10-28 LAB — CBC WITH DIFFERENTIAL (CANCER CENTER ONLY)
Abs Immature Granulocytes: 0.01 10*3/uL (ref 0.00–0.07)
BASOS ABS: 0.1 10*3/uL (ref 0.0–0.1)
Basophils Relative: 2 %
EOS PCT: 2 %
Eosinophils Absolute: 0.1 10*3/uL (ref 0.0–0.5)
HEMATOCRIT: 30.5 % — AB (ref 36.0–46.0)
Hemoglobin: 9.6 g/dL — ABNORMAL LOW (ref 12.0–15.0)
Immature Granulocytes: 0 %
LYMPHS ABS: 0.7 10*3/uL (ref 0.7–4.0)
LYMPHS PCT: 24 %
MCH: 28.6 pg (ref 26.0–34.0)
MCHC: 31.5 g/dL (ref 30.0–36.0)
MCV: 90.8 fL (ref 80.0–100.0)
MONO ABS: 0.3 10*3/uL (ref 0.1–1.0)
Monocytes Relative: 10 %
Neutro Abs: 1.8 10*3/uL (ref 1.7–7.7)
Neutrophils Relative %: 62 %
PLATELETS: 122 10*3/uL — AB (ref 150–400)
RBC: 3.36 MIL/uL — ABNORMAL LOW (ref 3.87–5.11)
RDW: 14.9 % (ref 11.5–15.5)
WBC Count: 2.9 10*3/uL — ABNORMAL LOW (ref 4.0–10.5)
nRBC: 0 % (ref 0.0–0.2)

## 2017-10-28 LAB — CMP (CANCER CENTER ONLY)
ALT: 40 U/L (ref 0–44)
AST: 38 U/L (ref 15–41)
Albumin: 3.5 g/dL (ref 3.5–5.0)
Alkaline Phosphatase: 113 U/L (ref 38–126)
Anion gap: 11 (ref 5–15)
BUN: 8 mg/dL (ref 6–20)
CHLORIDE: 106 mmol/L (ref 98–111)
CO2: 23 mmol/L (ref 22–32)
CREATININE: 0.8 mg/dL (ref 0.44–1.00)
Calcium: 9 mg/dL (ref 8.9–10.3)
GFR, Est AFR Am: >60 mL/min (ref >60)
GFR, Estimated: >60 mL/min (ref >60)
Glucose, Bld: 136 mg/dL — ABNORMAL HIGH (ref 70–99)
Potassium: 4 mmol/L (ref 3.5–5.1)
SODIUM: 140 mmol/L (ref 135–145)
Total Bilirubin: 0.5 mg/dL (ref 0.3–1.2)
Total Protein: 6.6 g/dL (ref 6.5–8.1)

## 2017-10-28 MED ORDER — ONDANSETRON HCL 8 MG PO TABS
ORAL_TABLET | ORAL | Status: AC
  Filled 2017-10-28: qty 1

## 2017-10-28 MED ORDER — BORTEZOMIB CHEMO SQ INJECTION 3.5 MG (2.5MG/ML)
1.3000 mg/m2 | Freq: Once | INTRAMUSCULAR | Status: AC
  Administered 2017-10-28: 3 mg via SUBCUTANEOUS
  Filled 2017-10-28: qty 1.2

## 2017-10-28 MED ORDER — ONDANSETRON HCL 8 MG PO TABS
8.0000 mg | ORAL_TABLET | Freq: Once | ORAL | Status: AC
  Administered 2017-10-28: 8 mg via ORAL

## 2017-10-28 NOTE — Patient Instructions (Signed)
[facility] Discharge Instructions for Patients Receiving Chemotherapy  Today you received the following chemotherapy agents: Bortezomib (Velcade)  To help prevent nausea and vomiting after your treatment, we encourage you to take your nausea medication  as prescribed.    If you develop nausea and vomiting that is not controlled by your nausea medication, call the clinic.   BELOW ARE SYMPTOMS THAT SHOULD BE REPORTED IMMEDIATELY:  *FEVER GREATER THAN 100.5 F  *CHILLS WITH OR WITHOUT FEVER  NAUSEA AND VOMITING THAT IS NOT CONTROLLED WITH YOUR NAUSEA MEDICATION  *UNUSUAL SHORTNESS OF BREATH  *UNUSUAL BRUISING OR BLEEDING  TENDERNESS IN MOUTH AND THROAT WITH OR WITHOUT PRESENCE OF ULCERS  *URINARY PROBLEMS  *BOWEL PROBLEMS  UNUSUAL RASH Items with * indicate a potential emergency and should be followed up as soon as possible.  Feel free to call the clinic should you have any questions or concerns. The clinic phone number is [phone].  Please show the CHEMO ALERT CARD at check-in to the Emergency Department and triage nurse.   

## 2017-11-05 ENCOUNTER — Inpatient Hospital Stay (HOSPITAL_BASED_OUTPATIENT_CLINIC_OR_DEPARTMENT_OTHER): Payer: BLUE CROSS/BLUE SHIELD | Admitting: Oncology

## 2017-11-05 ENCOUNTER — Encounter: Payer: Self-pay | Admitting: Oncology

## 2017-11-05 ENCOUNTER — Ambulatory Visit: Payer: BLUE CROSS/BLUE SHIELD

## 2017-11-05 ENCOUNTER — Inpatient Hospital Stay: Payer: BLUE CROSS/BLUE SHIELD

## 2017-11-05 ENCOUNTER — Other Ambulatory Visit: Payer: BLUE CROSS/BLUE SHIELD

## 2017-11-05 ENCOUNTER — Telehealth: Payer: Self-pay

## 2017-11-05 VITALS — BP 106/58 | HR 87 | Temp 98.6°F | Resp 18 | Ht 67.0 in | Wt 259.2 lb

## 2017-11-05 DIAGNOSIS — E119 Type 2 diabetes mellitus without complications: Secondary | ICD-10-CM | POA: Diagnosis not present

## 2017-11-05 DIAGNOSIS — E7800 Pure hypercholesterolemia, unspecified: Secondary | ICD-10-CM

## 2017-11-05 DIAGNOSIS — F419 Anxiety disorder, unspecified: Secondary | ICD-10-CM

## 2017-11-05 DIAGNOSIS — Z79899 Other long term (current) drug therapy: Secondary | ICD-10-CM

## 2017-11-05 DIAGNOSIS — R110 Nausea: Secondary | ICD-10-CM | POA: Diagnosis not present

## 2017-11-05 DIAGNOSIS — Z7901 Long term (current) use of anticoagulants: Secondary | ICD-10-CM

## 2017-11-05 DIAGNOSIS — C9000 Multiple myeloma not having achieved remission: Secondary | ICD-10-CM

## 2017-11-05 DIAGNOSIS — K521 Toxic gastroenteritis and colitis: Secondary | ICD-10-CM | POA: Diagnosis not present

## 2017-11-05 DIAGNOSIS — Z7984 Long term (current) use of oral hypoglycemic drugs: Secondary | ICD-10-CM | POA: Diagnosis not present

## 2017-11-05 DIAGNOSIS — Z5111 Encounter for antineoplastic chemotherapy: Secondary | ICD-10-CM

## 2017-11-05 DIAGNOSIS — Z5112 Encounter for antineoplastic immunotherapy: Secondary | ICD-10-CM | POA: Diagnosis not present

## 2017-11-05 LAB — CBC WITH DIFFERENTIAL (CANCER CENTER ONLY)
Abs Immature Granulocytes: 0.05 10*3/uL (ref 0.00–0.07)
Basophils Absolute: 0 10*3/uL (ref 0.0–0.1)
Basophils Relative: 1 %
EOS ABS: 0.1 10*3/uL (ref 0.0–0.5)
Eosinophils Relative: 3 %
HCT: 30.1 % — ABNORMAL LOW (ref 36.0–46.0)
Hemoglobin: 9.6 g/dL — ABNORMAL LOW (ref 12.0–15.0)
Immature Granulocytes: 1 %
Lymphocytes Relative: 19 %
Lymphs Abs: 0.7 10*3/uL (ref 0.7–4.0)
MCH: 28.8 pg (ref 26.0–34.0)
MCHC: 31.9 g/dL (ref 30.0–36.0)
MCV: 90.4 fL (ref 80.0–100.0)
MONO ABS: 0.2 10*3/uL (ref 0.1–1.0)
MONOS PCT: 6 %
NEUTROS ABS: 2.7 10*3/uL (ref 1.7–7.7)
Neutrophils Relative %: 70 %
Platelet Count: 113 10*3/uL — ABNORMAL LOW (ref 150–400)
RBC: 3.33 MIL/uL — ABNORMAL LOW (ref 3.87–5.11)
RDW: 14.6 % (ref 11.5–15.5)
WBC Count: 3.9 10*3/uL — ABNORMAL LOW (ref 4.0–10.5)
nRBC: 0 % (ref 0.0–0.2)

## 2017-11-05 LAB — CMP (CANCER CENTER ONLY)
ALT: 42 U/L (ref 0–44)
ANION GAP: 12 (ref 5–15)
AST: 37 U/L (ref 15–41)
Albumin: 3.6 g/dL (ref 3.5–5.0)
Alkaline Phosphatase: 125 U/L (ref 38–126)
BILIRUBIN TOTAL: 0.6 mg/dL (ref 0.3–1.2)
BUN: 9 mg/dL (ref 6–20)
CO2: 23 mmol/L (ref 22–32)
Calcium: 9.1 mg/dL (ref 8.9–10.3)
Chloride: 104 mmol/L (ref 98–111)
Creatinine: 1.01 mg/dL — ABNORMAL HIGH (ref 0.44–1.00)
GFR, Est AFR Am: >60 mL/min (ref >60)
Glucose, Bld: 133 mg/dL — ABNORMAL HIGH (ref 70–99)
POTASSIUM: 3.8 mmol/L (ref 3.5–5.1)
Sodium: 139 mmol/L (ref 135–145)
TOTAL PROTEIN: 6.8 g/dL (ref 6.5–8.1)

## 2017-11-05 MED ORDER — ONDANSETRON HCL 8 MG PO TABS
8.0000 mg | ORAL_TABLET | Freq: Once | ORAL | Status: AC
  Administered 2017-11-05: 8 mg via ORAL

## 2017-11-05 MED ORDER — BORTEZOMIB CHEMO SQ INJECTION 3.5 MG (2.5MG/ML)
1.3000 mg/m2 | Freq: Once | INTRAMUSCULAR | Status: AC
  Administered 2017-11-05: 3 mg via SUBCUTANEOUS
  Filled 2017-11-05: qty 1.2

## 2017-11-05 MED ORDER — ONDANSETRON HCL 8 MG PO TABS
ORAL_TABLET | ORAL | Status: AC
  Filled 2017-11-05: qty 1

## 2017-11-05 MED ORDER — ACYCLOVIR 400 MG PO TABS: 400.0000 mg | ORAL_TABLET | Freq: Two times a day (BID) | ORAL | 1 refills | Status: DC

## 2017-11-05 NOTE — Telephone Encounter (Signed)
Printed avs and calender of upcoming appointment. Per 10/29 los DID NOT SCHEDULE WEEK OF 23RD OF DEC. Patient will be out town for the entire week . She said she would discuss with provider on next MD visit which week to have return visit.

## 2017-11-05 NOTE — Assessment & Plan Note (Signed)
This is a very pleasant 57 year old white female with multiple myeloma status post several chemotherapy regimens. The patient has been on treatment with subcutaneous weekly Velcade as well as Revlimid and Decadron. Status post16cycles.  The patient  was on observation for a duration of 2 months. She had repeat myeloma panel performed which showed further increase in the free lambda light chains. She resumed treatment with subcutaneous Velcade, Revlimid and Decadron.  Status post 1 cycle.  She tolerating her treatment well overall with no concerning complaints except for mild diarrhea and intermittent nausea. Recommend for her to proceed with her treatment today as scheduled.  She will have weekly labs and chemotherapy.  She will follow-up visit in 4 weeks.  She will have a repeat myeloma panel performed approximately 1 week before her next visit.  For diarrhea, I have advised her use Imodium as needed.  For nausea, she may use Zofran as needed.  She was advised to call immediately if she has any concerning symptoms in the interval. All questions were answered. The patient knows to call the clinic with any problems, questions or concerns. We can certainly see the patient much sooner if necessary.

## 2017-11-05 NOTE — Progress Notes (Signed)
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   PRIOR THERAPY: 1) Velcade 1.3 MG/M2 subcutaneously with Decadron 40 mg by mouth on a weekly basis. First cycle 11/24/2013. She status post 31 weekly doses of treatment. 2) Velcade 1.3 MG/M2 subcutaneously and weekly basis with Decadron 40 mg by mouth weekly. First dose 02/01/2015. Status post 28 cycles. 3) Revlimid 25 mg by mouth daily for 21 days every 4 weeks with weekly Decadron 20 mg. started in 11/27/2015. Status post 3 cycles discontinued secondary to lack of response.  CURRENT THERAPY: Systemic treatment with Velcade 1.3 MG/KG weekly, Revlimid 25 mg by mouth daily for 21 days every 4 weeks in addition to Decadron 20 mg by mouth weekly. First dose 03/06/2016. Status post 16cycles.  She was placed in observation for 2 months and then resumed treatment on 10/02/2017.  Status post 1 cycle.  INTERVAL HISTORY: Brenda Conner 57 y.o. female returns for routine follow-up visit by herself.  The patient is feeling fine today and has no specific complaints.  She is tolerating her treatment with Velcade, dexamethasone and Revlimid fairly well.  She reports intermittent nausea and diarrhea but this is unchanged from her baseline.  She denies fevers and chills.  Denies chest pain, shortness of breath, cough, hemoptysis.  Denies vomiting and constipation.  Denies recent weight loss or night sweats.  The patient is here for evaluation prior to her next cycle of chemo.  MEDICAL HISTORY: Past Medical History:  Diagnosis Date  . Acid reflux   . Anxiety   . Asthma   . Cancer [facility])    waldenstroms/ macroglobinulemia  . Depression   . Depression   . Diabetes mellitus without complication ([place])   . Dysuria 02/28/2016  . Hypercholesteremia   .  Hypertension   . Hypothyroidism   . Macroglobulinemia ([place])    ? POEMS syndrome  . Multiple myeloma ([place])   . Obesity   . PONV (postoperative nausea and vomiting)   . Sleep apnea    CPAP at bedtime  . Sleep apnea     ALLERGIES:  is allergic to codeine; hydrocodone; lortab [hydrocodone-acetaminophen]; onion; shellfish allergy; and amoxicillin.  MEDICATIONS:  Current Outpatient Medications  Medication Sig Dispense Refill  . acyclovir (ZOVIRAX) 400 MG tablet Take 1 tablet (400 mg total) by mouth 2 (two) times daily. 180 tablet 1  . ALPRAZolam (XANAX) 0.5 MG tablet Take 1 tablet (0.5 mg total) by mouth at bedtime as needed for anxiety. 30 tablet 0  . Azelastine HCl 0.15 % SOLN as needed.    . Blood Glucose Monitoring Suppl (ONE TOUCH ULTRA MINI) W/DEVICE KIT See admin instructions. Reported on 01/25/2015  0  . Cetirizine HCl (ZYRTEC ALLERGY PO) Take by mouth daily.    [name] dexamethasone (DECADRON) 4 MG tablet Take 4 mg by mouth once a week. Patient takes 5 tablets 1 day week prior to [facility] appointment.    . DiphenhydrAMINE HCl (BENADRYL ALLERGY PO) Take by mouth as needed.    [name] EPIPEN 2-PAK 0.3 MG/0.3ML SOAJ injection Reported on 06/21/2015  1  . Fexofenadine HCl (ALLEGRA ALLERGY PO) Take by mouth daily.    . Fluticasone-Salmeterol (ADVAIR) 100-50 MCG/DOSE AEPB Inhale 2 puffs into the lungs every 12 (twelve) hours.    [name] ibuprofen (ADVIL,MOTRIN) 100 MG tablet Take 100 mg by mouth  every 6 (six) hours as needed. Reported on 05/31/2015    . lenalidomide (REVLIMID) 25 MG capsule TAKE 1 CAPSULE BY MOUTH ONCE DAILY 21 DAYS ON AND 7 DAYS OFF 10/18/17 auth number [id number] adult female not of childbearing potential. 21 capsule 0  . levothyroxine (SYNTHROID, LEVOTHROID) 150 MCG tablet Take 150 mcg by mouth daily before breakfast.    . lisinopril (PRINIVIL,ZESTRIL) 10 MG tablet Take 10 mg by mouth daily.  3  . loperamide (IMODIUM) 2 MG capsule Take 2 mg by mouth as needed for diarrhea or loose stools.     . metFORMIN (GLUCOPHAGE) 1000 MG tablet Take 1,000 mg by mouth 2 (two) times daily.  3  . omeprazole (PRILOSEC) 40 MG capsule Take 40 mg by mouth 2 (two) times daily.     . ondansetron (ZOFRAN-ODT) 8 MG disintegrating tablet Take 1 tablet (8 mg total) by mouth every 8 (eight) hours as needed. Reported on 05/31/2015 20 tablet 2  . pravastatin (PRAVACHOL) 40 MG tablet Take 40 mg by mouth every evening.     [name] PROAIR HFA 108 (90 Base) MCG/ACT inhaler Reported on 06/21/2015  1  . sertraline (ZOLOFT) 50 MG tablet TAKE 3 TABLETS BY MOUTH EVERY DAY 270 tablet 3  . verapamil (CALAN-SR) 240 MG CR tablet Take 240 mg by mouth 2 (two) times daily.    [name] warfarin (COUMADIN) 2 MG tablet TAKE 1 TABLET BY MOUTH EVERY DAY 90 tablet 0   No current facility-administered medications for this visit.     SURGICAL HISTORY:  Past Surgical History:  Procedure Laterality Date  . ABLATION  09/2007   HTA and polyp resection  . BACK SURGERY  03/2004   herniation, L4-L5  . Bil Laprascopic knee surgery    . BONE MARROW BIOPSY     2011  . BONE MARROW BIOPSY  4/14  . CHOLECYSTECTOMY    . FOOT SURGERY  1998  . KNEE ARTHROSCOPY W/ MENISCAL REPAIR  10/10 ; 3/11  . LAPAROSCOPIC CHOLECYSTECTOMY  1997  . NASAL SINUS SURGERY  2004  . UTERINE FIBROID EMBOLIZATION      REVIEW OF SYSTEMS:   Review of Systems  Constitutional: Negative for appetite change, chills, fatigue, fever and unexpected weight change.  HENT:   Negative for mouth sores, nosebleeds, sore throat and trouble swallowing.   Eyes: Negative for eye problems and icterus.  Respiratory: Negative for cough, hemoptysis, shortness of breath and wheezing.   Cardiovascular: Negative for chest pain and leg swelling.  Gastrointestinal: Negative for abdominal pain, constipation, and vomiting.  Positive for intermittent nausea and diarrhea which is unchanged from her baseline. Genitourinary: Negative for bladder incontinence, difficulty urinating, dysuria, frequency and  hematuria.   Musculoskeletal: Negative for back pain, gait problem, neck pain and neck stiffness.  Skin: Negative for itching and rash.  Neurological: Negative for dizziness, extremity weakness, gait problem, headaches, light-headedness and seizures.  Hematological: Negative for adenopathy. Does not bruise/bleed easily.  Psychiatric/Behavioral: Negative for confusion, depression and sleep disturbance. The patient is not nervous/anxious.     PHYSICAL EXAMINATION:  Blood pressure (!) 106/58, pulse 87, temperature 98.6 F (37 C), temperature source Oral, resp. rate 18, height 5' 7"  (1.702 m), weight 259 lb 3.2 oz (117.6 kg), SpO2 98 %.  ECOG PERFORMANCE STATUS: 1 - Symptomatic but completely ambulatory  Physical Exam  Constitutional: Oriented to person, place, and time and well-developed, well-nourished, and in no distress. No distress.  HENT:  Head: Normocephalic and atraumatic.  Mouth/Throat: Oropharynx is clear  and moist. No oropharyngeal exudate.  Eyes: Conjunctivae are normal. Right eye exhibits no discharge. Left eye exhibits no discharge. No scleral icterus.  Neck: Normal range of motion. Neck supple.  Cardiovascular: Normal rate, regular rhythm, normal heart sounds and intact distal pulses.   Pulmonary/Chest: Effort normal and breath sounds normal. No respiratory distress. No wheezes. No rales.  Abdominal: Soft. Bowel sounds are normal. Exhibits no distension and no mass. There is no tenderness.  Musculoskeletal: Normal range of motion. Exhibits no edema.  Lymphadenopathy:    No cervical adenopathy.  Neurological: Alert and oriented to person, place, and time. Exhibits normal muscle tone. Gait normal. Coordination normal.  Skin: Skin is warm and dry. No rash noted. Not diaphoretic. No erythema. No pallor.  Psychiatric: Mood, memory and judgment normal.  Vitals reviewed.  LABORATORY DATA: Lab Results  Component Value Date   WBC 3.9 (L) 11/05/2017   HGB 9.6 (L) 11/05/2017    HCT 30.1 (L) 11/05/2017   MCV 90.4 11/05/2017   PLT 113 (L) 11/05/2017      Chemistry      Component Value Date/Time   NA 139 11/05/2017 1221   NA 138 11/14/2016 0824   K 3.8 11/05/2017 1221   K 4.3 11/14/2016 0824   CL 104 11/05/2017 1221   CL 104 04/07/2012 1415   CO2 23 11/05/2017 1221   CO2 23 11/14/2016 0824   BUN 9 11/05/2017 1221   BUN 9.1 11/14/2016 0824   CREATININE 1.01 (H) 11/05/2017 1221   CREATININE 0.7 11/14/2016 0824      Component Value Date/Time   CALCIUM 9.1 11/05/2017 1221   CALCIUM 9.6 11/14/2016 0824   ALKPHOS 125 11/05/2017 1221   ALKPHOS 94 11/14/2016 0824   AST 37 11/05/2017 1221   AST 28 11/14/2016 0824   ALT 42 11/05/2017 1221   ALT 24 11/14/2016 0824   BILITOT 0.6 11/05/2017 1221   BILITOT 0.41 11/14/2016 0824       RADIOGRAPHIC STUDIES:  No results found.   ASSESSMENT/PLAN:  Multiple myeloma This is a very pleasant 57 year old white female with multiple myeloma status post several chemotherapy regimens. The patient has been on treatment with subcutaneous weekly Velcade as well as Revlimid and Decadron. Status post16cycles.  The patient  was on observation for a duration of 2 months. She had repeat myeloma panel performed which showed further increase in the free lambda light chains. She resumed treatment with subcutaneous Velcade, Revlimid and Decadron.  Status post 1 cycle.  She tolerating her treatment well overall with no concerning complaints except for mild diarrhea and intermittent nausea. Recommend for her to proceed with her treatment today as scheduled.  She will have weekly labs and chemotherapy.  She will follow-up visit in 4 weeks.  She will have a repeat myeloma panel performed approximately 1 week before her next visit.  For diarrhea, I have advised her use Imodium as needed.  For nausea, she may use Zofran as needed.  She was advised to call immediately if she has any concerning symptoms in the interval. All  questions were answered. The patient knows to call the clinic with any problems, questions or concerns. We can certainly see the patient much sooner if necessary.   Orders Placed This Encounter  Procedures  . Lactate dehydrogenase    Standing Status:   Future    Standing Expiration Date:   11/06/2018  . Kappa/lambda light chains    Standing Status:   Future    Standing  Expiration Date:   11/05/2018  . Beta 2 microglobulin, serum    Standing Status:   Future    Standing Expiration Date:   11/05/2018  . IgG, IgA, IgM    Standing Status:   Future    Standing Expiration Date:   11/05/2018     Mikey Bussing, DNP, AGPCNP-BC, AOCNP 11/05/17

## 2017-11-06 ENCOUNTER — Ambulatory Visit: Payer: BLUE CROSS/BLUE SHIELD | Admitting: Oncology

## 2017-11-11 ENCOUNTER — Other Ambulatory Visit: Payer: Self-pay | Admitting: *Deleted

## 2017-11-11 DIAGNOSIS — C9000 Multiple myeloma not having achieved remission: Secondary | ICD-10-CM

## 2017-11-11 MED ORDER — LENALIDOMIDE 25 MG PO CAPS: ORAL_CAPSULE | ORAL | 0 refills | Status: DC

## 2017-11-11 NOTE — Telephone Encounter (Signed)
Revlimid refill faxed to [facility]

## 2017-11-12 ENCOUNTER — Inpatient Hospital Stay: Payer: BLUE CROSS/BLUE SHIELD

## 2017-11-12 ENCOUNTER — Inpatient Hospital Stay: Payer: BLUE CROSS/BLUE SHIELD | Attending: Internal Medicine

## 2017-11-12 VITALS — BP 136/53 | HR 65 | Temp 97.7°F | Resp 16

## 2017-11-12 DIAGNOSIS — Z7984 Long term (current) use of oral hypoglycemic drugs: Secondary | ICD-10-CM | POA: Insufficient documentation

## 2017-11-12 DIAGNOSIS — R5383 Other fatigue: Secondary | ICD-10-CM | POA: Insufficient documentation

## 2017-11-12 DIAGNOSIS — Z79899 Other long term (current) drug therapy: Secondary | ICD-10-CM | POA: Insufficient documentation

## 2017-11-12 DIAGNOSIS — Z5112 Encounter for antineoplastic immunotherapy: Secondary | ICD-10-CM | POA: Insufficient documentation

## 2017-11-12 DIAGNOSIS — Z791 Long term (current) use of non-steroidal anti-inflammatories (NSAID): Secondary | ICD-10-CM | POA: Insufficient documentation

## 2017-11-12 DIAGNOSIS — Z7901 Long term (current) use of anticoagulants: Secondary | ICD-10-CM | POA: Diagnosis not present

## 2017-11-12 DIAGNOSIS — F419 Anxiety disorder, unspecified: Secondary | ICD-10-CM | POA: Diagnosis not present

## 2017-11-12 DIAGNOSIS — E119 Type 2 diabetes mellitus without complications: Secondary | ICD-10-CM | POA: Diagnosis not present

## 2017-11-12 DIAGNOSIS — C9000 Multiple myeloma not having achieved remission: Secondary | ICD-10-CM | POA: Diagnosis not present

## 2017-11-12 LAB — CBC WITH DIFFERENTIAL (CANCER CENTER ONLY)
Abs Immature Granulocytes: 0.03 10*3/uL (ref 0.00–0.07)
BASOS ABS: 0 10*3/uL (ref 0.0–0.1)
Basophils Relative: 1 %
EOS ABS: 0.1 10*3/uL (ref 0.0–0.5)
Eosinophils Relative: 4 %
HCT: 30.7 % — ABNORMAL LOW (ref 36.0–46.0)
Hemoglobin: 9.7 g/dL — ABNORMAL LOW (ref 12.0–15.0)
IMMATURE GRANULOCYTES: 1 %
Lymphocytes Relative: 15 %
Lymphs Abs: 0.5 10*3/uL — ABNORMAL LOW (ref 0.7–4.0)
MCH: 28.5 pg (ref 26.0–34.0)
MCHC: 31.6 g/dL (ref 30.0–36.0)
MCV: 90.3 fL (ref 80.0–100.0)
MONOS PCT: 14 %
Monocytes Absolute: 0.5 10*3/uL (ref 0.1–1.0)
NEUTROS PCT: 65 %
NRBC: 0 % (ref 0.0–0.2)
Neutro Abs: 2.2 10*3/uL (ref 1.7–7.7)
Platelet Count: 106 10*3/uL — ABNORMAL LOW (ref 150–400)
RBC: 3.4 MIL/uL — AB (ref 3.87–5.11)
RDW: 14.6 % (ref 11.5–15.5)
WBC: 3.3 10*3/uL — AB (ref 4.0–10.5)

## 2017-11-12 LAB — CMP (CANCER CENTER ONLY)
ALT: 49 U/L — ABNORMAL HIGH (ref 0–44)
AST: 41 U/L (ref 15–41)
Albumin: 3.6 g/dL (ref 3.5–5.0)
Alkaline Phosphatase: 129 U/L — ABNORMAL HIGH (ref 38–126)
Anion gap: 11 (ref 5–15)
BUN: 5 mg/dL — AB (ref 6–20)
CO2: 23 mmol/L (ref 22–32)
Calcium: 9 mg/dL (ref 8.9–10.3)
Chloride: 106 mmol/L (ref 98–111)
Creatinine: 0.78 mg/dL (ref 0.44–1.00)
GFR, Est AFR Am: >60 mL/min (ref >60)
Glucose, Bld: 137 mg/dL — ABNORMAL HIGH (ref 70–99)
Potassium: 3.6 mmol/L (ref 3.5–5.1)
Sodium: 140 mmol/L (ref 135–145)
Total Bilirubin: 0.6 mg/dL (ref 0.3–1.2)
Total Protein: 6.7 g/dL (ref 6.5–8.1)

## 2017-11-12 MED ORDER — ONDANSETRON HCL 8 MG PO TABS
ORAL_TABLET | ORAL | Status: AC
  Filled 2017-11-12: qty 1

## 2017-11-12 MED ORDER — BORTEZOMIB CHEMO SQ INJECTION 3.5 MG (2.5MG/ML)
1.3000 mg/m2 | Freq: Once | INTRAMUSCULAR | Status: AC
  Administered 2017-11-12: 3 mg via SUBCUTANEOUS
  Filled 2017-11-12: qty 1.2

## 2017-11-12 MED ORDER — ONDANSETRON HCL 8 MG PO TABS
8.0000 mg | ORAL_TABLET | Freq: Once | ORAL | Status: AC
  Administered 2017-11-12: 8 mg via ORAL

## 2017-11-12 NOTE — Patient Instructions (Signed)
[facility] Discharge Instructions for Patients Receiving Chemotherapy  Today you received the following chemotherapy agents: Bortezomib (Velcade)  To help prevent nausea and vomiting after your treatment, we encourage you to take your nausea medication  as prescribed.    If you develop nausea and vomiting that is not controlled by your nausea medication, call the clinic.   BELOW ARE SYMPTOMS THAT SHOULD BE REPORTED IMMEDIATELY:  *FEVER GREATER THAN 100.5 F  *CHILLS WITH OR WITHOUT FEVER  NAUSEA AND VOMITING THAT IS NOT CONTROLLED WITH YOUR NAUSEA MEDICATION  *UNUSUAL SHORTNESS OF BREATH  *UNUSUAL BRUISING OR BLEEDING  TENDERNESS IN MOUTH AND THROAT WITH OR WITHOUT PRESENCE OF ULCERS  *URINARY PROBLEMS  *BOWEL PROBLEMS  UNUSUAL RASH Items with * indicate a potential emergency and should be followed up as soon as possible.  Feel free to call the clinic should you have any questions or concerns. The clinic phone number is [phone].  Please show the CHEMO ALERT CARD at check-in to the Emergency Department and triage nurse.   

## 2017-11-18 DIAGNOSIS — Z1231 Encounter for screening mammogram for malignant neoplasm of breast: Secondary | ICD-10-CM | POA: Diagnosis not present

## 2017-11-19 ENCOUNTER — Inpatient Hospital Stay: Payer: BLUE CROSS/BLUE SHIELD

## 2017-11-19 VITALS — BP 130/68 | HR 63 | Temp 98.3°F | Resp 20

## 2017-11-19 DIAGNOSIS — Z5112 Encounter for antineoplastic immunotherapy: Secondary | ICD-10-CM | POA: Diagnosis not present

## 2017-11-19 DIAGNOSIS — E119 Type 2 diabetes mellitus without complications: Secondary | ICD-10-CM | POA: Diagnosis not present

## 2017-11-19 DIAGNOSIS — Z7984 Long term (current) use of oral hypoglycemic drugs: Secondary | ICD-10-CM | POA: Diagnosis not present

## 2017-11-19 DIAGNOSIS — R5383 Other fatigue: Secondary | ICD-10-CM | POA: Diagnosis not present

## 2017-11-19 DIAGNOSIS — Z79899 Other long term (current) drug therapy: Secondary | ICD-10-CM | POA: Diagnosis not present

## 2017-11-19 DIAGNOSIS — C9000 Multiple myeloma not having achieved remission: Secondary | ICD-10-CM

## 2017-11-19 DIAGNOSIS — Z791 Long term (current) use of non-steroidal anti-inflammatories (NSAID): Secondary | ICD-10-CM | POA: Diagnosis not present

## 2017-11-19 DIAGNOSIS — F419 Anxiety disorder, unspecified: Secondary | ICD-10-CM | POA: Diagnosis not present

## 2017-11-19 DIAGNOSIS — Z7901 Long term (current) use of anticoagulants: Secondary | ICD-10-CM | POA: Diagnosis not present

## 2017-11-19 LAB — CMP (CANCER CENTER ONLY)
ALBUMIN: 3.6 g/dL (ref 3.5–5.0)
ALK PHOS: 133 U/L — AB (ref 38–126)
ALT: 49 U/L — AB (ref 0–44)
AST: 44 U/L — ABNORMAL HIGH (ref 15–41)
Anion gap: 10 (ref 5–15)
BILIRUBIN TOTAL: 0.7 mg/dL (ref 0.3–1.2)
BUN: 7 mg/dL (ref 6–20)
CHLORIDE: 107 mmol/L (ref 98–111)
CO2: 22 mmol/L (ref 22–32)
Calcium: 8.9 mg/dL (ref 8.9–10.3)
Creatinine: 0.77 mg/dL (ref 0.44–1.00)
GFR, Estimated: >60 mL/min (ref >60)
Glucose, Bld: 149 mg/dL — ABNORMAL HIGH (ref 70–99)
Potassium: 3.5 mmol/L (ref 3.5–5.1)
SODIUM: 139 mmol/L (ref 135–145)
Total Protein: 6.6 g/dL (ref 6.5–8.1)

## 2017-11-19 LAB — CBC WITH DIFFERENTIAL (CANCER CENTER ONLY)
Abs Immature Granulocytes: 0.04 10*3/uL (ref 0.00–0.07)
BASOS ABS: 0 10*3/uL (ref 0.0–0.1)
Basophils Relative: 1 %
EOS ABS: 0.2 10*3/uL (ref 0.0–0.5)
Eosinophils Relative: 8 %
HEMATOCRIT: 30.2 % — AB (ref 36.0–46.0)
HEMOGLOBIN: 9.4 g/dL — AB (ref 12.0–15.0)
IMMATURE GRANULOCYTES: 1 %
LYMPHS ABS: 0.5 10*3/uL — AB (ref 0.7–4.0)
LYMPHS PCT: 18 %
MCH: 27.9 pg (ref 26.0–34.0)
MCHC: 31.1 g/dL (ref 30.0–36.0)
MCV: 89.6 fL (ref 80.0–100.0)
Monocytes Absolute: 0.3 10*3/uL (ref 0.1–1.0)
Monocytes Relative: 10 %
NEUTROS PCT: 62 %
NRBC: 0 % (ref 0.0–0.2)
Neutro Abs: 1.8 10*3/uL (ref 1.7–7.7)
Platelet Count: 106 10*3/uL — ABNORMAL LOW (ref 150–400)
RBC: 3.37 MIL/uL — ABNORMAL LOW (ref 3.87–5.11)
RDW: 14.9 % (ref 11.5–15.5)
WBC Count: 3 10*3/uL — ABNORMAL LOW (ref 4.0–10.5)

## 2017-11-19 MED ORDER — ONDANSETRON HCL 8 MG PO TABS
8.0000 mg | ORAL_TABLET | Freq: Once | ORAL | Status: AC
  Administered 2017-11-19: 8 mg via ORAL

## 2017-11-19 MED ORDER — BORTEZOMIB CHEMO SQ INJECTION 3.5 MG (2.5MG/ML)
1.3000 mg/m2 | Freq: Once | INTRAMUSCULAR | Status: AC
  Administered 2017-11-19: 3 mg via SUBCUTANEOUS
  Filled 2017-11-19: qty 1.2

## 2017-11-19 MED ORDER — ONDANSETRON HCL 8 MG PO TABS
ORAL_TABLET | ORAL | Status: AC
  Filled 2017-11-19: qty 1

## 2017-11-26 ENCOUNTER — Inpatient Hospital Stay: Payer: BLUE CROSS/BLUE SHIELD

## 2017-11-26 ENCOUNTER — Other Ambulatory Visit: Payer: Self-pay | Admitting: Internal Medicine

## 2017-11-26 VITALS — BP 129/68 | HR 62 | Temp 98.3°F | Resp 18 | Wt 257.5 lb

## 2017-11-26 DIAGNOSIS — C9000 Multiple myeloma not having achieved remission: Secondary | ICD-10-CM

## 2017-11-26 DIAGNOSIS — R5383 Other fatigue: Secondary | ICD-10-CM | POA: Diagnosis not present

## 2017-11-26 DIAGNOSIS — Z7984 Long term (current) use of oral hypoglycemic drugs: Secondary | ICD-10-CM | POA: Diagnosis not present

## 2017-11-26 DIAGNOSIS — Z791 Long term (current) use of non-steroidal anti-inflammatories (NSAID): Secondary | ICD-10-CM | POA: Diagnosis not present

## 2017-11-26 DIAGNOSIS — F419 Anxiety disorder, unspecified: Secondary | ICD-10-CM | POA: Diagnosis not present

## 2017-11-26 DIAGNOSIS — Z79899 Other long term (current) drug therapy: Secondary | ICD-10-CM | POA: Diagnosis not present

## 2017-11-26 DIAGNOSIS — E119 Type 2 diabetes mellitus without complications: Secondary | ICD-10-CM | POA: Diagnosis not present

## 2017-11-26 DIAGNOSIS — Z5112 Encounter for antineoplastic immunotherapy: Secondary | ICD-10-CM | POA: Diagnosis not present

## 2017-11-26 DIAGNOSIS — Z7901 Long term (current) use of anticoagulants: Secondary | ICD-10-CM | POA: Diagnosis not present

## 2017-11-26 LAB — CBC WITH DIFFERENTIAL (CANCER CENTER ONLY)
Abs Immature Granulocytes: 0 10*3/uL (ref 0.00–0.07)
BASOS ABS: 0.1 10*3/uL (ref 0.0–0.1)
Basophils Relative: 2 %
EOS PCT: 1 %
Eosinophils Absolute: 0 10*3/uL (ref 0.0–0.5)
HEMATOCRIT: 29.9 % — AB (ref 36.0–46.0)
HEMOGLOBIN: 9.5 g/dL — AB (ref 12.0–15.0)
IMMATURE GRANULOCYTES: 0 %
LYMPHS ABS: 0.6 10*3/uL — AB (ref 0.7–4.0)
LYMPHS PCT: 23 %
MCH: 28.8 pg (ref 26.0–34.0)
MCHC: 31.8 g/dL (ref 30.0–36.0)
MCV: 90.6 fL (ref 80.0–100.0)
Monocytes Absolute: 0.4 10*3/uL (ref 0.1–1.0)
Monocytes Relative: 13 %
NEUTROS ABS: 1.7 10*3/uL (ref 1.7–7.7)
NEUTROS PCT: 61 %
NRBC: 0 % (ref 0.0–0.2)
Platelet Count: 119 10*3/uL — ABNORMAL LOW (ref 150–400)
RBC: 3.3 MIL/uL — AB (ref 3.87–5.11)
RDW: 14.9 % (ref 11.5–15.5)
WBC: 2.8 10*3/uL — AB (ref 4.0–10.5)

## 2017-11-26 LAB — CMP (CANCER CENTER ONLY)
ALT: 34 U/L (ref 0–44)
ANION GAP: 11 (ref 5–15)
AST: 34 U/L (ref 15–41)
Albumin: 3.6 g/dL (ref 3.5–5.0)
Alkaline Phosphatase: 112 U/L (ref 38–126)
BUN: 8 mg/dL (ref 6–20)
CHLORIDE: 108 mmol/L (ref 98–111)
CO2: 22 mmol/L (ref 22–32)
Calcium: 9.2 mg/dL (ref 8.9–10.3)
Creatinine: 0.79 mg/dL (ref 0.44–1.00)
Glucose, Bld: 141 mg/dL — ABNORMAL HIGH (ref 70–99)
POTASSIUM: 4.1 mmol/L (ref 3.5–5.1)
SODIUM: 141 mmol/L (ref 135–145)
Total Bilirubin: 0.7 mg/dL (ref 0.3–1.2)
Total Protein: 6.7 g/dL (ref 6.5–8.1)

## 2017-11-26 LAB — LACTATE DEHYDROGENASE: LDH: 136 U/L (ref 98–192)

## 2017-11-26 MED ORDER — BORTEZOMIB CHEMO SQ INJECTION 3.5 MG (2.5MG/ML)
1.3000 mg/m2 | Freq: Once | INTRAMUSCULAR | Status: AC
  Administered 2017-11-26: 3 mg via SUBCUTANEOUS
  Filled 2017-11-26: qty 1.2

## 2017-11-26 MED ORDER — ONDANSETRON HCL 8 MG PO TABS
ORAL_TABLET | ORAL | Status: AC
  Filled 2017-11-26: qty 1

## 2017-11-26 MED ORDER — ONDANSETRON HCL 8 MG PO TABS
8.0000 mg | ORAL_TABLET | Freq: Once | ORAL | Status: AC
  Administered 2017-11-26: 8 mg via ORAL

## 2017-11-26 NOTE — Patient Instructions (Signed)
[facility] Discharge Instructions for Patients Receiving Chemotherapy  Today you received the following chemotherapy agents Bortezomib (VELCADE).  To help prevent nausea and vomiting after your treatment, we encourage you to take your nausea medication as prescribed.   If you develop nausea and vomiting that is not controlled by your nausea medication, call the clinic.   BELOW ARE SYMPTOMS THAT SHOULD BE REPORTED IMMEDIATELY:  *FEVER GREATER THAN 100.5 F  *CHILLS WITH OR WITHOUT FEVER  NAUSEA AND VOMITING THAT IS NOT CONTROLLED WITH YOUR NAUSEA MEDICATION  *UNUSUAL SHORTNESS OF BREATH  *UNUSUAL BRUISING OR BLEEDING  TENDERNESS IN MOUTH AND THROAT WITH OR WITHOUT PRESENCE OF ULCERS  *URINARY PROBLEMS  *BOWEL PROBLEMS  UNUSUAL RASH Items with * indicate a potential emergency and should be followed up as soon as possible.  Feel free to call the clinic should you have any questions or concerns. The clinic phone number is [phone].  Please show the CHEMO ALERT CARD at check-in to the Emergency Department and triage nurse.   

## 2017-11-27 LAB — BETA 2 MICROGLOBULIN, SERUM: Beta-2 Microglobulin: 1.7 mg/L (ref 0.6–2.4)

## 2017-11-27 LAB — IGG, IGA, IGM
IGA: 109 mg/dL (ref 87–352)
IgG (Immunoglobin G), Serum: 399 mg/dL — ABNORMAL LOW (ref 700–1600)
IgM (Immunoglobulin M), Srm: 569 mg/dL — ABNORMAL HIGH (ref 26–217)

## 2017-11-27 LAB — KAPPA/LAMBDA LIGHT CHAINS
KAPPA, LAMDA LIGHT CHAIN RATIO: 0.03 — AB (ref 0.26–1.65)
Kappa free light chain: 12.1 mg/L (ref 3.3–19.4)
LAMDA FREE LIGHT CHAINS: 374.5 mg/L — AB (ref 5.7–26.3)

## 2017-12-03 ENCOUNTER — Ambulatory Visit: Payer: BLUE CROSS/BLUE SHIELD

## 2017-12-03 ENCOUNTER — Other Ambulatory Visit: Payer: BLUE CROSS/BLUE SHIELD

## 2017-12-03 ENCOUNTER — Telehealth: Payer: Self-pay | Admitting: Internal Medicine

## 2017-12-03 ENCOUNTER — Inpatient Hospital Stay (HOSPITAL_BASED_OUTPATIENT_CLINIC_OR_DEPARTMENT_OTHER): Payer: BLUE CROSS/BLUE SHIELD | Admitting: Internal Medicine

## 2017-12-03 ENCOUNTER — Inpatient Hospital Stay: Payer: BLUE CROSS/BLUE SHIELD

## 2017-12-03 ENCOUNTER — Encounter: Payer: Self-pay | Admitting: Internal Medicine

## 2017-12-03 VITALS — BP 124/70 | HR 70 | Temp 98.7°F | Resp 20 | Ht 67.0 in | Wt 252.7 lb

## 2017-12-03 DIAGNOSIS — Z791 Long term (current) use of non-steroidal anti-inflammatories (NSAID): Secondary | ICD-10-CM | POA: Diagnosis not present

## 2017-12-03 DIAGNOSIS — E119 Type 2 diabetes mellitus without complications: Secondary | ICD-10-CM | POA: Diagnosis not present

## 2017-12-03 DIAGNOSIS — C9000 Multiple myeloma not having achieved remission: Secondary | ICD-10-CM

## 2017-12-03 DIAGNOSIS — Z7984 Long term (current) use of oral hypoglycemic drugs: Secondary | ICD-10-CM | POA: Diagnosis not present

## 2017-12-03 DIAGNOSIS — Z7901 Long term (current) use of anticoagulants: Secondary | ICD-10-CM

## 2017-12-03 DIAGNOSIS — R5383 Other fatigue: Secondary | ICD-10-CM

## 2017-12-03 DIAGNOSIS — Z5111 Encounter for antineoplastic chemotherapy: Secondary | ICD-10-CM

## 2017-12-03 DIAGNOSIS — F419 Anxiety disorder, unspecified: Secondary | ICD-10-CM | POA: Diagnosis not present

## 2017-12-03 DIAGNOSIS — Z79899 Other long term (current) drug therapy: Secondary | ICD-10-CM

## 2017-12-03 DIAGNOSIS — Z5112 Encounter for antineoplastic immunotherapy: Secondary | ICD-10-CM | POA: Diagnosis not present

## 2017-12-03 LAB — CMP (CANCER CENTER ONLY)
ALBUMIN: 3.7 g/dL (ref 3.5–5.0)
ALK PHOS: 112 U/L (ref 38–126)
ALT: 33 U/L (ref 0–44)
ANION GAP: 9 (ref 5–15)
AST: 34 U/L (ref 15–41)
BUN: 6 mg/dL (ref 6–20)
CALCIUM: 9 mg/dL (ref 8.9–10.3)
CO2: 24 mmol/L (ref 22–32)
Chloride: 106 mmol/L (ref 98–111)
Creatinine: 0.77 mg/dL (ref 0.44–1.00)
GFR, Est AFR Am: >60 mL/min (ref >60)
GFR, Estimated: >60 mL/min (ref >60)
Glucose, Bld: 96 mg/dL (ref 70–99)
Potassium: 3.6 mmol/L (ref 3.5–5.1)
Sodium: 139 mmol/L (ref 135–145)
TOTAL PROTEIN: 6.7 g/dL (ref 6.5–8.1)
Total Bilirubin: 0.7 mg/dL (ref 0.3–1.2)

## 2017-12-03 LAB — CBC WITH DIFFERENTIAL (CANCER CENTER ONLY)
Abs Immature Granulocytes: 0.02 10*3/uL (ref 0.00–0.07)
BASOS ABS: 0 10*3/uL (ref 0.0–0.1)
Basophils Relative: 1 %
EOS PCT: 3 %
Eosinophils Absolute: 0.1 10*3/uL (ref 0.0–0.5)
HEMATOCRIT: 31 % — AB (ref 36.0–46.0)
HEMOGLOBIN: 9.8 g/dL — AB (ref 12.0–15.0)
Immature Granulocytes: 1 %
LYMPHS ABS: 0.6 10*3/uL — AB (ref 0.7–4.0)
LYMPHS PCT: 17 %
MCH: 28.5 pg (ref 26.0–34.0)
MCHC: 31.6 g/dL (ref 30.0–36.0)
MCV: 90.1 fL (ref 80.0–100.0)
MONO ABS: 0.3 10*3/uL (ref 0.1–1.0)
Monocytes Relative: 8 %
Neutro Abs: 2.5 10*3/uL (ref 1.7–7.7)
Neutrophils Relative %: 70 %
Platelet Count: 125 10*3/uL — ABNORMAL LOW (ref 150–400)
RBC: 3.44 MIL/uL — ABNORMAL LOW (ref 3.87–5.11)
RDW: 15 % (ref 11.5–15.5)
WBC Count: 3.5 10*3/uL — ABNORMAL LOW (ref 4.0–10.5)
nRBC: 0 % (ref 0.0–0.2)

## 2017-12-03 MED ORDER — BORTEZOMIB CHEMO SQ INJECTION 3.5 MG (2.5MG/ML)
1.3000 mg/m2 | Freq: Once | INTRAMUSCULAR | Status: AC
  Administered 2017-12-03: 3 mg via SUBCUTANEOUS
  Filled 2017-12-03: qty 1.2

## 2017-12-03 MED ORDER — ONDANSETRON HCL 8 MG PO TABS
8.0000 mg | ORAL_TABLET | Freq: Once | ORAL | Status: AC
  Administered 2017-12-03: 8 mg via ORAL

## 2017-12-03 MED ORDER — ONDANSETRON HCL 8 MG PO TABS
ORAL_TABLET | ORAL | Status: AC
  Filled 2017-12-03: qty 1

## 2017-12-03 NOTE — Progress Notes (Signed)
.[facility] [phone]   [phone]  OFFICE PROGRESS NOTE  [name], MD [place] [address]  DIAGNOSIS: Plasma cell dyscrasia initially diagnosed as MGUS in September 2010, with additional symptoms suggestive of POEMS syndrome.   PRIOR THERAPY:  1) Velcade 1.3 MG/M2 subcutaneously with Decadron 40 mg by mouth on a weekly basis. First cycle 11/24/2013. She status post 31 weekly doses of treatment. 2) Velcade 1.3 MG/M2 subcutaneously and weekly basis with Decadron 40 mg by mouth weekly. First dose 02/01/2015. Status post 28 cycles. 3) Revlimid 25 mg by mouth daily for 21 days every 4 weeks with weekly Decadron 20 mg. started in 11/27/2015. Status post 3 cycles discontinued secondary to lack of response.  CURRENT THERAPY: Systemic treatment with Velcade 1.3 MG/KG weekly, Revlimid 25 mg by mouth daily for 21 days every 4 weeks in addition to Decadron 20 mg by mouth weekly. First dose 03/06/2016. Status post 18 cycles.   INTERVAL HISTORY: Brenda Conner 57 y.o. female returns to the clinic today for follow-up visit.  The patient is feeling fine today with no concerning complaints.  She denied having any significant fatigue or weakness.  She denied having any nausea, vomiting, diarrhea or constipation.  The patient denied having any chest pain, shortness of breath, cough or hemoptysis.  She denied having any fever or chills.  She has no nausea, vomiting, diarrhea or constipation.  She has no headache or visual changes.  She continues to tolerate her treatment with Velcade, Revlimid and Decadron fairly well.   MEDICAL HISTORY: Past Medical History:  Diagnosis Date  . Acid reflux   . Anxiety   . Asthma   . Cancer [facility])    waldenstroms/ macroglobinulemia  . Depression   . Depression   . Diabetes mellitus without complication ([place])   . Dysuria 02/28/2016  . Hypercholesteremia   . Hypertension   . Hypothyroidism    . Macroglobulinemia ([place])    ? POEMS syndrome  . Multiple myeloma ([place])   . Obesity   . PONV (postoperative nausea and vomiting)   . Sleep apnea    CPAP at bedtime  . Sleep apnea     ALLERGIES:  is allergic to codeine; hydrocodone; lortab [hydrocodone-acetaminophen]; onion; shellfish allergy; and amoxicillin.  MEDICATIONS:  Current Outpatient Medications  Medication Sig Dispense Refill  . acyclovir (ZOVIRAX) 400 MG tablet Take 1 tablet (400 mg total) by mouth 2 (two) times daily. 180 tablet 1  . ALPRAZolam (XANAX) 0.5 MG tablet Take 1 tablet (0.5 mg total) by mouth at bedtime as needed for anxiety. 30 tablet 0  . Azelastine HCl 0.15 % SOLN as needed.    . Blood Glucose Monitoring Suppl (ONE TOUCH ULTRA MINI) W/DEVICE KIT See admin instructions. Reported on 01/25/2015  0  . Cetirizine HCl (ZYRTEC ALLERGY PO) Take by mouth daily.    [name] dexamethasone (DECADRON) 4 MG tablet Take 4 mg by mouth once a week. Patient takes 5 tablets 1 day week prior to [facility] appointment.    . DiphenhydrAMINE HCl (BENADRYL ALLERGY PO) Take by mouth as needed.    [name] EPIPEN 2-PAK 0.3 MG/0.3ML SOAJ injection Reported on 06/21/2015  1  . Fexofenadine HCl (ALLEGRA ALLERGY PO) Take by mouth daily.    . Fluticasone-Salmeterol (ADVAIR) 100-50 MCG/DOSE AEPB Inhale 2 puffs into the lungs every 12 (twelve) hours.    [name] ibuprofen (ADVIL,MOTRIN) 100 MG tablet Take 100 mg  by mouth every 6 (six) hours as needed. Reported on 05/31/2015    . lenalidomide (REVLIMID) 25 MG capsule TAKE 1 CAPSULE BY MOUTH ONCE DAILY 21 DAYS ON AND 7 DAYS OFF 10/18/17 auth number [id number] adult female not of childbearing potential. 11/11/17 Authorization # [id number] 21 capsule 0  . levothyroxine (SYNTHROID, LEVOTHROID) 150 MCG tablet Take 150 mcg by mouth daily before breakfast.    . lisinopril (PRINIVIL,ZESTRIL) 10 MG tablet Take 10 mg by mouth daily.  3  . loperamide (IMODIUM) 2 MG capsule Take 2 mg by mouth as needed for diarrhea or loose stools.    .  metFORMIN (GLUCOPHAGE) 1000 MG tablet Take 1,000 mg by mouth 2 (two) times daily.  3  . omeprazole (PRILOSEC) 40 MG capsule Take 40 mg by mouth 2 (two) times daily.     . ondansetron (ZOFRAN-ODT) 8 MG disintegrating tablet Take 1 tablet (8 mg total) by mouth every 8 (eight) hours as needed. Reported on 05/31/2015 20 tablet 2  . pravastatin (PRAVACHOL) 40 MG tablet Take 40 mg by mouth every evening.     [name] PROAIR HFA 108 (90 Base) MCG/ACT inhaler Reported on 06/21/2015  1  . sertraline (ZOLOFT) 50 MG tablet TAKE 3 TABLETS BY MOUTH EVERY DAY 270 tablet 3  . verapamil (CALAN-SR) 240 MG CR tablet Take 240 mg by mouth 2 (two) times daily.    [name] warfarin (COUMADIN) 2 MG tablet TAKE 1 TABLET BY MOUTH EVERY DAY 90 tablet 0   No current facility-administered medications for this visit.     SURGICAL HISTORY:  Past Surgical History:  Procedure Laterality Date  . ABLATION  09/2007   HTA and polyp resection  . BACK SURGERY  03/2004   herniation, L4-L5  . Bil Laprascopic knee surgery    . BONE MARROW BIOPSY     2011  . BONE MARROW BIOPSY  4/14  . CHOLECYSTECTOMY    . FOOT SURGERY  1998  . KNEE ARTHROSCOPY W/ MENISCAL REPAIR  10/10 ; 3/11  . LAPAROSCOPIC CHOLECYSTECTOMY  1997  . NASAL SINUS SURGERY  2004  . UTERINE FIBROID EMBOLIZATION      REVIEW OF SYSTEMS:  Constitutional: positive for fatigue Eyes: negative Ears, nose, mouth, throat, and face: negative Respiratory: negative Cardiovascular: negative Gastrointestinal: negative Genitourinary:negative Integument/breast: negative Hematologic/lymphatic: negative Musculoskeletal:negative Neurological: negative Behavioral/Psych: negative Endocrine: negative Allergic/Immunologic: negative   PHYSICAL EXAMINATION: General appearance: alert, cooperative, fatigued and no distress Head: Normocephalic, without obvious abnormality, atraumatic Neck: no adenopathy Lymph nodes: Cervical, supraclavicular, and axillary nodes normal. Resp: clear to  auscultation bilaterally Back: symmetric, no curvature. ROM normal. No CVA tenderness. Cardio: regular rate and rhythm, S1, S2 normal, no murmur, click, rub or gallop GI: soft, non-tender; bowel sounds normal; no masses,  no organomegaly Extremities: extremities normal, atraumatic, no cyanosis or edema Neurologic: Alert and oriented X 3, normal strength and tone. Normal symmetric reflexes. Normal coordination and gait  ECOG PERFORMANCE STATUS: 1 - Symptomatic but completely ambulatory  Blood pressure 124/70, pulse 70, temperature 98.7 F (37.1 C), temperature source Oral, resp. rate 20, height 5' 7" (1.702 m), weight 252 lb 11.2 oz (114.6 kg), SpO2 100 %.  LABORATORY DATA: Lab Results  Component Value Date   WBC 3.5 (L) 12/03/2017   HGB 9.8 (L) 12/03/2017   HCT 31.0 (L) 12/03/2017   MCV 90.1 12/03/2017   PLT 125 (L) 12/03/2017      Chemistry      Component Value Date/Time   NA 139 12/03/2017 1317  NA 138 11/14/2016 0824   K 3.6 12/03/2017 1317   K 4.3 11/14/2016 0824   CL 106 12/03/2017 1317   CL 104 04/07/2012 1415   CO2 24 12/03/2017 1317   CO2 23 11/14/2016 0824   BUN 6 12/03/2017 1317   BUN 9.1 11/14/2016 0824   CREATININE 0.77 12/03/2017 1317   CREATININE 0.7 11/14/2016 0824      Component Value Date/Time   CALCIUM 9.0 12/03/2017 1317   CALCIUM 9.6 11/14/2016 0824   ALKPHOS 112 12/03/2017 1317   ALKPHOS 94 11/14/2016 0824   AST 34 12/03/2017 1317   AST 28 11/14/2016 0824   ALT 33 12/03/2017 1317   ALT 24 11/14/2016 0824   BILITOT 0.7 12/03/2017 1317   BILITOT 0.41 11/14/2016 0824      ASSESSMENT AND PLAN:  This is a very pleasant 57 years old white female with multiple myeloma status post several chemotherapy regimens. The patient has been on treatment with subcutaneous weekly Velcade as well as Revlimid and DecadronStatus post 18 cycles.  The patient has been tolerating this treatment well with no concerning adverse effects. She had repeat myeloma panel  performed recently.  I personally reviewed the lab results and discussed it with the patient today.  She has improvement in her condition with decrease in the free lambda light chain as well as the IgM. I recommended for the patient to continue her current treatment with Velcade Revlimid and Decadron. I will see the patient back for follow-up visit in 5 weeks for evaluation and repeat blood work. The patient was advised to call immediately if she has any concerning symptoms in the interval. All questions were answered. The patient knows to call the clinic with any problems, questions or concerns. We can certainly see the patient much sooner if necessary.  Disclaimer: This note was dictated with voice recognition software. Similar sounding words can inadvertently be transcribed and may not be corrected upon review.

## 2017-12-03 NOTE — Patient Instructions (Signed)
[facility] Discharge Instructions for Patients Receiving Chemotherapy  Today you received the following chemotherapy agents Bortezomib (VELCADE).  To help prevent nausea and vomiting after your treatment, we encourage you to take your nausea medication as prescribed.   If you develop nausea and vomiting that is not controlled by your nausea medication, call the clinic.   BELOW ARE SYMPTOMS THAT SHOULD BE REPORTED IMMEDIATELY:  *FEVER GREATER THAN 100.5 F  *CHILLS WITH OR WITHOUT FEVER  NAUSEA AND VOMITING THAT IS NOT CONTROLLED WITH YOUR NAUSEA MEDICATION  *UNUSUAL SHORTNESS OF BREATH  *UNUSUAL BRUISING OR BLEEDING  TENDERNESS IN MOUTH AND THROAT WITH OR WITHOUT PRESENCE OF ULCERS  *URINARY PROBLEMS  *BOWEL PROBLEMS  UNUSUAL RASH Items with * indicate a potential emergency and should be followed up as soon as possible.  Feel free to call the clinic should you have any questions or concerns. The clinic phone number is [phone].  Please show the CHEMO ALERT CARD at check-in to the Emergency Department and triage nurse.   

## 2017-12-03 NOTE — Telephone Encounter (Signed)
Gave pt avs and calendar  °

## 2017-12-09 ENCOUNTER — Inpatient Hospital Stay: Payer: BLUE CROSS/BLUE SHIELD

## 2017-12-09 ENCOUNTER — Inpatient Hospital Stay: Payer: BLUE CROSS/BLUE SHIELD | Attending: Internal Medicine

## 2017-12-09 VITALS — BP 139/80 | HR 64 | Temp 97.9°F | Resp 14

## 2017-12-09 DIAGNOSIS — C9000 Multiple myeloma not having achieved remission: Secondary | ICD-10-CM

## 2017-12-09 DIAGNOSIS — R5383 Other fatigue: Secondary | ICD-10-CM | POA: Diagnosis not present

## 2017-12-09 DIAGNOSIS — R197 Diarrhea, unspecified: Secondary | ICD-10-CM | POA: Diagnosis not present

## 2017-12-09 DIAGNOSIS — Z8249 Family history of ischemic heart disease and other diseases of the circulatory system: Secondary | ICD-10-CM | POA: Diagnosis not present

## 2017-12-09 DIAGNOSIS — Z5112 Encounter for antineoplastic immunotherapy: Secondary | ICD-10-CM | POA: Insufficient documentation

## 2017-12-09 DIAGNOSIS — G629 Polyneuropathy, unspecified: Secondary | ICD-10-CM | POA: Insufficient documentation

## 2017-12-09 LAB — CBC WITH DIFFERENTIAL (CANCER CENTER ONLY)
Abs Immature Granulocytes: 0.03 10*3/uL (ref 0.00–0.07)
BASOS ABS: 0 10*3/uL (ref 0.0–0.1)
BASOS PCT: 1 %
EOS ABS: 0.1 10*3/uL (ref 0.0–0.5)
EOS PCT: 4 %
HCT: 29.5 % — ABNORMAL LOW (ref 36.0–46.0)
Hemoglobin: 9.4 g/dL — ABNORMAL LOW (ref 12.0–15.0)
Immature Granulocytes: 1 %
LYMPHS PCT: 19 %
Lymphs Abs: 0.6 10*3/uL — ABNORMAL LOW (ref 0.7–4.0)
MCH: 28.7 pg (ref 26.0–34.0)
MCHC: 31.9 g/dL (ref 30.0–36.0)
MCV: 89.9 fL (ref 80.0–100.0)
Monocytes Absolute: 0.4 10*3/uL (ref 0.1–1.0)
Monocytes Relative: 13 %
NRBC: 0 % (ref 0.0–0.2)
Neutro Abs: 2.1 10*3/uL (ref 1.7–7.7)
Neutrophils Relative %: 62 %
Platelet Count: 102 10*3/uL — ABNORMAL LOW (ref 150–400)
RBC: 3.28 MIL/uL — AB (ref 3.87–5.11)
RDW: 15.1 % (ref 11.5–15.5)
WBC Count: 3.3 10*3/uL — ABNORMAL LOW (ref 4.0–10.5)

## 2017-12-09 LAB — CMP (CANCER CENTER ONLY)
ALBUMIN: 3.6 g/dL (ref 3.5–5.0)
ALT: 28 U/L (ref 0–44)
ANION GAP: 11 (ref 5–15)
AST: 26 U/L (ref 15–41)
Alkaline Phosphatase: 108 U/L (ref 38–126)
BUN: 9 mg/dL (ref 6–20)
CALCIUM: 9 mg/dL (ref 8.9–10.3)
CO2: 22 mmol/L (ref 22–32)
Chloride: 107 mmol/L (ref 98–111)
Creatinine: 0.8 mg/dL (ref 0.44–1.00)
GFR, Estimated: >60 mL/min (ref >60)
GLUCOSE: 147 mg/dL — AB (ref 70–99)
POTASSIUM: 3.3 mmol/L — AB (ref 3.5–5.1)
SODIUM: 140 mmol/L (ref 135–145)
Total Bilirubin: 0.6 mg/dL (ref 0.3–1.2)
Total Protein: 6.4 g/dL — ABNORMAL LOW (ref 6.5–8.1)

## 2017-12-09 MED ORDER — BORTEZOMIB CHEMO SQ INJECTION 3.5 MG (2.5MG/ML)
1.3000 mg/m2 | Freq: Once | INTRAMUSCULAR | Status: AC
  Administered 2017-12-09: 3 mg via SUBCUTANEOUS
  Filled 2017-12-09: qty 1.2

## 2017-12-09 MED ORDER — ONDANSETRON HCL 8 MG PO TABS
8.0000 mg | ORAL_TABLET | Freq: Once | ORAL | Status: AC
  Administered 2017-12-09: 8 mg via ORAL

## 2017-12-09 MED ORDER — ONDANSETRON HCL 8 MG PO TABS
ORAL_TABLET | ORAL | Status: AC
  Filled 2017-12-09: qty 1

## 2017-12-09 NOTE — Patient Instructions (Signed)
[facility] Discharge Instructions for Patients Receiving Chemotherapy  Today you received the following chemotherapy agents Bortezomib (VELCADE).  To help prevent nausea and vomiting after your treatment, we encourage you to take your nausea medication as prescribed.   If you develop nausea and vomiting that is not controlled by your nausea medication, call the clinic.   BELOW ARE SYMPTOMS THAT SHOULD BE REPORTED IMMEDIATELY:  *FEVER GREATER THAN 100.5 F  *CHILLS WITH OR WITHOUT FEVER  NAUSEA AND VOMITING THAT IS NOT CONTROLLED WITH YOUR NAUSEA MEDICATION  *UNUSUAL SHORTNESS OF BREATH  *UNUSUAL BRUISING OR BLEEDING  TENDERNESS IN MOUTH AND THROAT WITH OR WITHOUT PRESENCE OF ULCERS  *URINARY PROBLEMS  *BOWEL PROBLEMS  UNUSUAL RASH Items with * indicate a potential emergency and should be followed up as soon as possible.  Feel free to call the clinic should you have any questions or concerns. The clinic phone number is [phone].  Please show the CHEMO ALERT CARD at check-in to the Emergency Department and triage nurse.   

## 2017-12-10 ENCOUNTER — Other Ambulatory Visit: Payer: Self-pay | Admitting: Internal Medicine

## 2017-12-10 ENCOUNTER — Other Ambulatory Visit: Payer: Self-pay | Admitting: Medical Oncology

## 2017-12-10 ENCOUNTER — Ambulatory Visit: Payer: BLUE CROSS/BLUE SHIELD

## 2017-12-10 DIAGNOSIS — C9000 Multiple myeloma not having achieved remission: Secondary | ICD-10-CM

## 2017-12-10 MED ORDER — LENALIDOMIDE 25 MG PO CAPS: ORAL_CAPSULE | ORAL | 0 refills | Status: DC

## 2017-12-16 ENCOUNTER — Other Ambulatory Visit: Payer: Self-pay | Admitting: Internal Medicine

## 2017-12-16 DIAGNOSIS — C9000 Multiple myeloma not having achieved remission: Secondary | ICD-10-CM

## 2017-12-17 ENCOUNTER — Inpatient Hospital Stay: Payer: BLUE CROSS/BLUE SHIELD

## 2017-12-17 VITALS — BP 132/66 | HR 61 | Temp 98.0°F | Resp 17

## 2017-12-17 DIAGNOSIS — Z8249 Family history of ischemic heart disease and other diseases of the circulatory system: Secondary | ICD-10-CM | POA: Diagnosis not present

## 2017-12-17 DIAGNOSIS — G629 Polyneuropathy, unspecified: Secondary | ICD-10-CM | POA: Diagnosis not present

## 2017-12-17 DIAGNOSIS — Z5112 Encounter for antineoplastic immunotherapy: Secondary | ICD-10-CM | POA: Diagnosis not present

## 2017-12-17 DIAGNOSIS — C9000 Multiple myeloma not having achieved remission: Secondary | ICD-10-CM

## 2017-12-17 DIAGNOSIS — R197 Diarrhea, unspecified: Secondary | ICD-10-CM | POA: Diagnosis not present

## 2017-12-17 DIAGNOSIS — R5383 Other fatigue: Secondary | ICD-10-CM | POA: Diagnosis not present

## 2017-12-17 LAB — CBC WITH DIFFERENTIAL (CANCER CENTER ONLY)
Abs Immature Granulocytes: 0.01 10*3/uL (ref 0.00–0.07)
BASOS ABS: 0 10*3/uL (ref 0.0–0.1)
Basophils Relative: 1 %
EOS ABS: 0.1 10*3/uL (ref 0.0–0.5)
EOS PCT: 4 %
HCT: 28.9 % — ABNORMAL LOW (ref 36.0–46.0)
Hemoglobin: 9.2 g/dL — ABNORMAL LOW (ref 12.0–15.0)
Immature Granulocytes: 1 %
Lymphocytes Relative: 26 %
Lymphs Abs: 0.6 10*3/uL — ABNORMAL LOW (ref 0.7–4.0)
MCH: 28.6 pg (ref 26.0–34.0)
MCHC: 31.8 g/dL (ref 30.0–36.0)
MCV: 89.8 fL (ref 80.0–100.0)
Monocytes Absolute: 0.3 10*3/uL (ref 0.1–1.0)
Monocytes Relative: 13 %
Neutro Abs: 1.2 10*3/uL — ABNORMAL LOW (ref 1.7–7.7)
Neutrophils Relative %: 55 %
Platelet Count: 112 10*3/uL — ABNORMAL LOW (ref 150–400)
RBC: 3.22 MIL/uL — ABNORMAL LOW (ref 3.87–5.11)
RDW: 15.5 % (ref 11.5–15.5)
WBC Count: 2.2 10*3/uL — ABNORMAL LOW (ref 4.0–10.5)
nRBC: 0 % (ref 0.0–0.2)

## 2017-12-17 LAB — CMP (CANCER CENTER ONLY)
ALT: 33 U/L (ref 0–44)
ANION GAP: 11 (ref 5–15)
AST: 33 U/L (ref 15–41)
Albumin: 3.6 g/dL (ref 3.5–5.0)
Alkaline Phosphatase: 123 U/L (ref 38–126)
BUN: 8 mg/dL (ref 6–20)
CO2: 24 mmol/L (ref 22–32)
Calcium: 8.9 mg/dL (ref 8.9–10.3)
Chloride: 105 mmol/L (ref 98–111)
Creatinine: 0.77 mg/dL (ref 0.44–1.00)
GFR, Est AFR Am: >60 mL/min (ref >60)
GFR, Estimated: >60 mL/min (ref >60)
Glucose, Bld: 141 mg/dL — ABNORMAL HIGH (ref 70–99)
Potassium: 3.5 mmol/L (ref 3.5–5.1)
Sodium: 140 mmol/L (ref 135–145)
Total Bilirubin: 0.6 mg/dL (ref 0.3–1.2)
Total Protein: 6.6 g/dL (ref 6.5–8.1)

## 2017-12-17 MED ORDER — ONDANSETRON HCL 8 MG PO TABS
ORAL_TABLET | ORAL | Status: AC
  Filled 2017-12-17: qty 1

## 2017-12-17 MED ORDER — ONDANSETRON HCL 8 MG PO TABS
8.0000 mg | ORAL_TABLET | Freq: Once | ORAL | Status: AC
  Administered 2017-12-17: 8 mg via ORAL

## 2017-12-17 MED ORDER — BORTEZOMIB CHEMO SQ INJECTION 3.5 MG (2.5MG/ML)
1.3000 mg/m2 | Freq: Once | INTRAMUSCULAR | Status: AC
  Administered 2017-12-17: 3 mg via SUBCUTANEOUS
  Filled 2017-12-17: qty 1.2

## 2017-12-17 NOTE — Patient Instructions (Signed)
[facility] Discharge Instructions for Patients Receiving Chemotherapy  Today you received the following chemotherapy agents Bortezomib (VELCADE).  To help prevent nausea and vomiting after your treatment, we encourage you to take your nausea medication as prescribed.   If you develop nausea and vomiting that is not controlled by your nausea medication, call the clinic.   BELOW ARE SYMPTOMS THAT SHOULD BE REPORTED IMMEDIATELY:  *FEVER GREATER THAN 100.5 F  *CHILLS WITH OR WITHOUT FEVER  NAUSEA AND VOMITING THAT IS NOT CONTROLLED WITH YOUR NAUSEA MEDICATION  *UNUSUAL SHORTNESS OF BREATH  *UNUSUAL BRUISING OR BLEEDING  TENDERNESS IN MOUTH AND THROAT WITH OR WITHOUT PRESENCE OF ULCERS  *URINARY PROBLEMS  *BOWEL PROBLEMS  UNUSUAL RASH Items with * indicate a potential emergency and should be followed up as soon as possible.  Feel free to call the clinic should you have any questions or concerns. The clinic phone number is [phone].  Please show the CHEMO ALERT CARD at check-in to the Emergency Department and triage nurse.   

## 2017-12-17 NOTE — Progress Notes (Signed)
Reviewed pt labs (CBC) with Carlos Levering., NP and pt ok to treat with ANC 1.2

## 2017-12-23 ENCOUNTER — Encounter: Payer: Self-pay | Admitting: Obstetrics & Gynecology

## 2017-12-24 ENCOUNTER — Inpatient Hospital Stay: Payer: BLUE CROSS/BLUE SHIELD

## 2017-12-24 VITALS — BP 144/57 | HR 64 | Temp 98.6°F | Resp 17

## 2017-12-24 DIAGNOSIS — Z5112 Encounter for antineoplastic immunotherapy: Secondary | ICD-10-CM | POA: Diagnosis not present

## 2017-12-24 DIAGNOSIS — C9000 Multiple myeloma not having achieved remission: Secondary | ICD-10-CM

## 2017-12-24 DIAGNOSIS — G629 Polyneuropathy, unspecified: Secondary | ICD-10-CM | POA: Diagnosis not present

## 2017-12-24 DIAGNOSIS — R5383 Other fatigue: Secondary | ICD-10-CM | POA: Diagnosis not present

## 2017-12-24 DIAGNOSIS — Z8249 Family history of ischemic heart disease and other diseases of the circulatory system: Secondary | ICD-10-CM | POA: Diagnosis not present

## 2017-12-24 DIAGNOSIS — R197 Diarrhea, unspecified: Secondary | ICD-10-CM | POA: Diagnosis not present

## 2017-12-24 LAB — CBC WITH DIFFERENTIAL (CANCER CENTER ONLY)
ABS IMMATURE GRANULOCYTES: 0.02 10*3/uL (ref 0.00–0.07)
Basophils Absolute: 0.1 10*3/uL (ref 0.0–0.1)
Basophils Relative: 2 %
Eosinophils Absolute: 0.1 10*3/uL (ref 0.0–0.5)
Eosinophils Relative: 2 %
HCT: 29.2 % — ABNORMAL LOW (ref 36.0–46.0)
Hemoglobin: 9.2 g/dL — ABNORMAL LOW (ref 12.0–15.0)
Immature Granulocytes: 1 %
LYMPHS PCT: 25 %
Lymphs Abs: 0.7 10*3/uL (ref 0.7–4.0)
MCH: 28.4 pg (ref 26.0–34.0)
MCHC: 31.5 g/dL (ref 30.0–36.0)
MCV: 90.1 fL (ref 80.0–100.0)
Monocytes Absolute: 0.3 10*3/uL (ref 0.1–1.0)
Monocytes Relative: 12 %
NEUTROS ABS: 1.6 10*3/uL — AB (ref 1.7–7.7)
Neutrophils Relative %: 58 %
Platelet Count: 113 10*3/uL — ABNORMAL LOW (ref 150–400)
RBC: 3.24 MIL/uL — ABNORMAL LOW (ref 3.87–5.11)
RDW: 15.6 % — ABNORMAL HIGH (ref 11.5–15.5)
WBC Count: 2.7 10*3/uL — ABNORMAL LOW (ref 4.0–10.5)
nRBC: 0 % (ref 0.0–0.2)

## 2017-12-24 LAB — CMP (CANCER CENTER ONLY)
ALT: 25 U/L (ref 0–44)
AST: 26 U/L (ref 15–41)
Albumin: 3.5 g/dL (ref 3.5–5.0)
Alkaline Phosphatase: 100 U/L (ref 38–126)
Anion gap: 10 (ref 5–15)
BUN: 6 mg/dL (ref 6–20)
CO2: 22 mmol/L (ref 22–32)
Calcium: 8.8 mg/dL — ABNORMAL LOW (ref 8.9–10.3)
Chloride: 109 mmol/L (ref 98–111)
Creatinine: 0.72 mg/dL (ref 0.44–1.00)
GFR, Est AFR Am: >60 mL/min (ref >60)
GFR, Estimated: >60 mL/min (ref >60)
Glucose, Bld: 116 mg/dL — ABNORMAL HIGH (ref 70–99)
POTASSIUM: 3.4 mmol/L — AB (ref 3.5–5.1)
Sodium: 141 mmol/L (ref 135–145)
Total Bilirubin: 0.6 mg/dL (ref 0.3–1.2)
Total Protein: 6.4 g/dL — ABNORMAL LOW (ref 6.5–8.1)

## 2017-12-24 MED ORDER — BORTEZOMIB CHEMO SQ INJECTION 3.5 MG (2.5MG/ML)
1.3000 mg/m2 | Freq: Once | INTRAMUSCULAR | Status: AC
  Administered 2017-12-24: 3 mg via SUBCUTANEOUS
  Filled 2017-12-24: qty 1.2

## 2017-12-24 MED ORDER — ONDANSETRON HCL 8 MG PO TABS
8.0000 mg | ORAL_TABLET | Freq: Once | ORAL | Status: AC
  Administered 2017-12-24: 8 mg via ORAL

## 2017-12-24 MED ORDER — ONDANSETRON HCL 8 MG PO TABS
ORAL_TABLET | ORAL | Status: AC
  Filled 2017-12-24: qty 1

## 2017-12-24 NOTE — Patient Instructions (Signed)
[facility] Discharge Instructions for Patients Receiving Chemotherapy  Today you received the following chemotherapy agents Bortezomib (VELCADE).  To help prevent nausea and vomiting after your treatment, we encourage you to take your nausea medication as prescribed.   If you develop nausea and vomiting that is not controlled by your nausea medication, call the clinic.   BELOW ARE SYMPTOMS THAT SHOULD BE REPORTED IMMEDIATELY:  *FEVER GREATER THAN 100.5 F  *CHILLS WITH OR WITHOUT FEVER  NAUSEA AND VOMITING THAT IS NOT CONTROLLED WITH YOUR NAUSEA MEDICATION  *UNUSUAL SHORTNESS OF BREATH  *UNUSUAL BRUISING OR BLEEDING  TENDERNESS IN MOUTH AND THROAT WITH OR WITHOUT PRESENCE OF ULCERS  *URINARY PROBLEMS  *BOWEL PROBLEMS  UNUSUAL RASH Items with * indicate a potential emergency and should be followed up as soon as possible.  Feel free to call the clinic should you have any questions or concerns. The clinic phone number is [phone].  Please show the CHEMO ALERT CARD at check-in to the Emergency Department and triage nurse.   

## 2018-01-06 ENCOUNTER — Other Ambulatory Visit: Payer: Self-pay | Admitting: *Deleted

## 2018-01-06 ENCOUNTER — Other Ambulatory Visit: Payer: Self-pay | Admitting: Internal Medicine

## 2018-01-06 DIAGNOSIS — C9000 Multiple myeloma not having achieved remission: Secondary | ICD-10-CM

## 2018-01-06 NOTE — Telephone Encounter (Signed)
Revlimid Auth# [id number]

## 2018-01-07 ENCOUNTER — Encounter: Payer: Self-pay | Admitting: Adult Health

## 2018-01-07 ENCOUNTER — Inpatient Hospital Stay: Payer: BLUE CROSS/BLUE SHIELD

## 2018-01-07 ENCOUNTER — Inpatient Hospital Stay (HOSPITAL_BASED_OUTPATIENT_CLINIC_OR_DEPARTMENT_OTHER): Payer: BLUE CROSS/BLUE SHIELD | Admitting: Adult Health

## 2018-01-07 VITALS — BP 131/49 | HR 63 | Temp 98.1°F | Resp 18 | Ht 67.0 in | Wt 253.2 lb

## 2018-01-07 DIAGNOSIS — G629 Polyneuropathy, unspecified: Secondary | ICD-10-CM | POA: Diagnosis not present

## 2018-01-07 DIAGNOSIS — Z8249 Family history of ischemic heart disease and other diseases of the circulatory system: Secondary | ICD-10-CM

## 2018-01-07 DIAGNOSIS — C9000 Multiple myeloma not having achieved remission: Secondary | ICD-10-CM

## 2018-01-07 DIAGNOSIS — R197 Diarrhea, unspecified: Secondary | ICD-10-CM

## 2018-01-07 DIAGNOSIS — Z5112 Encounter for antineoplastic immunotherapy: Secondary | ICD-10-CM | POA: Diagnosis not present

## 2018-01-07 DIAGNOSIS — R5383 Other fatigue: Secondary | ICD-10-CM

## 2018-01-07 LAB — CBC WITH DIFFERENTIAL (CANCER CENTER ONLY)
Abs Immature Granulocytes: 0.02 10*3/uL (ref 0.00–0.07)
BASOS PCT: 1 %
Basophils Absolute: 0 10*3/uL (ref 0.0–0.1)
EOS ABS: 0.1 10*3/uL (ref 0.0–0.5)
Eosinophils Relative: 4 %
HCT: 30.3 % — ABNORMAL LOW (ref 36.0–46.0)
Hemoglobin: 9.5 g/dL — ABNORMAL LOW (ref 12.0–15.0)
Immature Granulocytes: 1 %
Lymphocytes Relative: 18 %
Lymphs Abs: 0.6 10*3/uL — ABNORMAL LOW (ref 0.7–4.0)
MCH: 28.3 pg (ref 26.0–34.0)
MCHC: 31.4 g/dL (ref 30.0–36.0)
MCV: 90.2 fL (ref 80.0–100.0)
Monocytes Absolute: 0.4 10*3/uL (ref 0.1–1.0)
Monocytes Relative: 12 %
NEUTROS PCT: 64 %
Neutro Abs: 2 10*3/uL (ref 1.7–7.7)
PLATELETS: 113 10*3/uL — AB (ref 150–400)
RBC: 3.36 MIL/uL — ABNORMAL LOW (ref 3.87–5.11)
RDW: 15.9 % — ABNORMAL HIGH (ref 11.5–15.5)
WBC Count: 3.1 10*3/uL — ABNORMAL LOW (ref 4.0–10.5)
nRBC: 0 % (ref 0.0–0.2)

## 2018-01-07 LAB — CMP (CANCER CENTER ONLY)
ALT: 29 U/L (ref 0–44)
AST: 31 U/L (ref 15–41)
Albumin: 3.6 g/dL (ref 3.5–5.0)
Alkaline Phosphatase: 111 U/L (ref 38–126)
Anion gap: 13 (ref 5–15)
BUN: 5 mg/dL — ABNORMAL LOW (ref 6–20)
CO2: 22 mmol/L (ref 22–32)
Calcium: 8.8 mg/dL — ABNORMAL LOW (ref 8.9–10.3)
Chloride: 106 mmol/L (ref 98–111)
Creatinine: 0.74 mg/dL (ref 0.44–1.00)
GFR, Est AFR Am: >60 mL/min (ref >60)
Glucose, Bld: 143 mg/dL — ABNORMAL HIGH (ref 70–99)
Potassium: 3.3 mmol/L — ABNORMAL LOW (ref 3.5–5.1)
Sodium: 141 mmol/L (ref 135–145)
Total Bilirubin: 0.8 mg/dL (ref 0.3–1.2)
Total Protein: 6.6 g/dL (ref 6.5–8.1)

## 2018-01-07 MED ORDER — BORTEZOMIB CHEMO SQ INJECTION 3.5 MG (2.5MG/ML)
1.3000 mg/m2 | Freq: Once | INTRAMUSCULAR | Status: AC
  Administered 2018-01-07: 3 mg via SUBCUTANEOUS
  Filled 2018-01-07: qty 1.2

## 2018-01-07 MED ORDER — ONDANSETRON HCL 8 MG PO TABS
ORAL_TABLET | ORAL | Status: AC
  Filled 2018-01-07: qty 1

## 2018-01-07 MED ORDER — ONDANSETRON HCL 8 MG PO TABS
8.0000 mg | ORAL_TABLET | Freq: Once | ORAL | Status: AC
  Administered 2018-01-07: 8 mg via ORAL

## 2018-01-07 NOTE — Progress Notes (Signed)
[facility] Follow up:    Brenda Dickens, MD [place] [address]   DIAGNOSIS: Cancer Staging No matching staging information was found for the patient.  SUMMARY OF ONCOLOGIC HISTORY: 1) Velcade 1.3 MG/M2 subcutaneously with Decadron 40 mg by mouth on a weekly basis. First cycle 11/24/2013. She status post 31 weekly doses of treatment. 2) Velcade 1.3 MG/M2 subcutaneously and weekly basis with Decadron 40 mg by mouth weekly. First dose 02/01/2015. Status post 28 cycles. 3) Revlimid 25 mg by mouth daily for 21 days every 4 weeks with weekly Decadron 20 mg. started in 11/27/2015. Status post 3 cycles discontinued secondary to lack of response.  CURRENT THERAPY: Systemic treatment with Velcade 1.3 MG/KG weekly, Revlimid 25 mg by mouth daily for 21 days every 4 weeks in addition to Decadron 20 mg by mouth weekly. First dose 03/06/2016.  INTERVAL HISTORY: Brenda Conner 57 y.o. female returns for evaluation prior to receiving her weekly velcade.  She notes baseline peripheral neuropathy in her fingertips related to a herniated disc.  This isn't worse today or recently than her baseline.  She takes Revlimid three weeks on and one week off.  This week is her third of three weeks.  She noted feeling colder than usual after her last cycle.  She says that however has improved.  She takes dexamethasone 77m weekly on Tuesdays.  She has some difficulty sleeping the subsequent two days, and then it resolves.  She does have some fatigue and this is stable.  She is still able to function with this.  She continues to work.  She works about 40 hours per week, this includes time working from home.  She denies any other issues during this time period. She isn't having pain.     Patient Active Problem List   Diagnosis Date Noted  . Dysuria 02/28/2016  . Encounter for antineoplastic chemotherapy 09/13/2015  . Multiple myeloma not having achieved remission ([facility]  03/29/2015  . Multiple myeloma ([facility] 11/18/2013  . 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 MGUS (monoclonal gammopathy of unknown significance) 10/03/2011    is allergic to codeine; hydrocodone; lortab [hydrocodone-acetaminophen]; onion; shellfish allergy; and amoxicillin.  MEDICAL HISTORY: Past Medical History:  Diagnosis Date  . Acid reflux   . Anxiety   . Asthma   . Cancer ([facility]    waldenstroms/ macroglobinulemia  . Depression   . Depression   . Diabetes mellitus without complication ([facility]   . Dysuria 02/28/2016  . Hypercholesteremia   . Hypertension   . Hypothyroidism   . [place]ulinemia ([facility]    ? POEMS syndrome  . [place]oma ([facility]   . Obesity   . PONV (postoperative nausea and vomiting)   . Sleep apnea    CPAP at bedtime  . Sleep apnea     SURGICAL HISTORY: Past Surgical History:  Procedure Laterality Date  . ABLATION  09/2007   HTA and polyp resection  . BACK SURGERY  03/2004   herniation, L4-L5  . Bil Laprascopic knee surgery    . BONE MARROW BIOPSY     2011  . BONE MARROW BIOPSY  4/14  . CHOLECYSTECTOMY    . FOOT SURGERY  1998  . KNEE ARTHROSCOPY W/ MENISCAL REPAIR  10/10 ; 3/11  . LAPAROSCOPIC CHOLECYSTECTOMY  1997  . NASAL SINUS SURGERY  2004  . UTERINE FIBROID EMBOLIZATION      SOCIAL HISTORY: Social History   Socioeconomic History  . Marital status:  Single    Spouse name: Not on file  . Number of children: Not on file  . Years of education: Not on file  . Highest education level: Not on file  Occupational History  . Not on file  Social Needs  . Financial resource strain: Not on file  . Food insecurity:    Worry: Not on file    Inability: Not on file  . Transportation needs:    Medical: Not on file    Non-medical: Not on file  Tobacco Use  . Smoking status: Never Smoker  . Smokeless tobacco: Never Used  Substance and Sexual Activity  . Alcohol use: No  . Drug use: No  . Sexual activity: Not Currently  Lifestyle  . Physical activity:     Days per week: Not on file    Minutes per session: Not on file  . Stress: Not on file  Relationships  . Social connections:    Talks on phone: Not on file    Gets together: Not on file    Attends religious service: Not on file    Active member of club or organization: Not on file    Attends meetings of clubs or organizations: Not on file    Relationship status: Not on file  . Intimate partner violence:    Fear of current or ex partner: Not on file    Emotionally abused: Not on file    Physically abused: Not on file    Forced sexual activity: Not on file  Other Topics Concern  . Not on file  Social History Narrative  . Not on file    FAMILY HISTORY: Family History  Problem Relation Age of Onset  . Heart disease Mother        heart cath  . Asthma Mother   . Endometriosis Mother     Review of Systems  Constitutional: Negative for appetite change, chills, fatigue, fever and unexpected weight change.  HENT:   Negative for hearing loss, lump/mass and trouble swallowing.   Eyes: Negative for eye problems and icterus.  Respiratory: Negative for chest tightness, cough and shortness of breath.   Cardiovascular: Negative for chest pain, leg swelling and palpitations.  Gastrointestinal: Positive for diarrhea (notes 4-6 loose stools per day, takes imodium, this is chronic for her). Negative for abdominal distention, abdominal pain, constipation, nausea and vomiting.  Endocrine: Negative for hot flashes.  Skin: Negative for itching and rash.  Neurological: Negative for dizziness, extremity weakness, headaches and numbness.  Hematological: Negative for adenopathy. Does not bruise/bleed easily.  Psychiatric/Behavioral: Negative for depression. The patient is not nervous/anxious.       PHYSICAL EXAMINATION  ECOG PERFORMANCE STATUS: 1 - Symptomatic but completely ambulatory  Vitals:   01/07/18 0909  BP: (!) 131/49  Pulse: 63  Resp: 18  Temp: 98.1 F (36.7 C)  SpO2: 100%     Physical Exam Constitutional:      Appearance: Normal appearance.  HENT:     Head: Normocephalic and atraumatic.     Mouth/Throat:     Mouth: Mucous membranes are moist.     Pharynx: Oropharynx is clear. No oropharyngeal exudate.  Eyes:     General: No scleral icterus.    Pupils: Pupils are equal, round, and reactive to light.  Neck:     Musculoskeletal: Neck supple.  Cardiovascular:     Rate and Rhythm: Normal rate and regular rhythm.     Heart sounds: Normal heart sounds.  Pulmonary:  Effort: Pulmonary effort is normal.     Breath sounds: Normal breath sounds.  Abdominal:     General: Bowel sounds are normal. There is no distension.     Palpations: Abdomen is soft.     Tenderness: There is no abdominal tenderness.  Skin:    General: Skin is warm and dry.     Capillary Refill: Capillary refill takes less than 2 seconds.  Neurological:     General: No focal deficit present.     Mental Status: She is alert.  Psychiatric:        Mood and Affect: Mood normal.        Behavior: Behavior normal.     LABORATORY DATA:  CBC    Component Value Date/Time   WBC 3.1 (L) 01/07/2018 0853   WBC 4.5 02/12/2017 0844   RBC 3.36 (L) 01/07/2018 0853   HGB 9.5 (L) 01/07/2018 0853   HGB 9.3 (L) 11/14/2016 0824   HCT 30.3 (L) 01/07/2018 0853   HCT 29.2 (L) 11/14/2016 0824   PLT 113 (L) 01/07/2018 0853   PLT 138 (L) 11/14/2016 0824   MCV 90.2 01/07/2018 0853   MCV 88.5 11/14/2016 0824   MCH 28.3 01/07/2018 0853   MCHC 31.4 01/07/2018 0853   RDW 15.9 (H) 01/07/2018 0853   RDW 14.6 (H) 11/14/2016 0824   LYMPHSABS 0.6 (L) 01/07/2018 0853   LYMPHSABS 0.6 (L) 11/14/2016 0824   MONOABS 0.4 01/07/2018 0853   MONOABS 0.3 11/14/2016 0824   EOSABS 0.1 01/07/2018 0853   EOSABS 0.1 11/14/2016 0824   BASOSABS 0.0 01/07/2018 0853   BASOSABS 0.0 11/14/2016 0824    CMP     Component Value Date/Time   NA 141 01/07/2018 0853   NA 138 11/14/2016 0824   K 3.3 (L) 01/07/2018 0853    K 4.3 11/14/2016 0824   CL 106 01/07/2018 0853   CL 104 04/07/2012 1415   CO2 22 01/07/2018 0853   CO2 23 11/14/2016 0824   GLUCOSE 143 (H) 01/07/2018 0853   GLUCOSE 110 11/14/2016 0824   GLUCOSE 119 (H) 04/07/2012 1415   BUN 5 (L) 01/07/2018 0853   BUN 9.1 11/14/2016 0824   CREATININE 0.74 01/07/2018 0853   CREATININE 0.7 11/14/2016 0824   CALCIUM 8.8 (L) 01/07/2018 0853   CALCIUM 9.6 11/14/2016 0824   PROT 6.6 01/07/2018 0853   PROT 7.0 11/14/2016 0824   ALBUMIN 3.6 01/07/2018 0853   ALBUMIN 3.9 11/14/2016 0824   AST 31 01/07/2018 0853   AST 28 11/14/2016 0824   ALT 29 01/07/2018 0853   ALT 24 11/14/2016 0824   ALKPHOS 111 01/07/2018 0853   ALKPHOS 94 11/14/2016 0824   BILITOT 0.8 01/07/2018 0853   BILITOT 0.41 11/14/2016 0824   GFRNONAA >60 01/07/2018 0853   GFRAA >60 01/07/2018 0853     ASSESSMENT and PLAN:   Multiple myeloma not having achieved remission ([place]) Sanoe is a 57 year old woman with multiple myeloma currently on treatment with weekly Velcade, Revlimid 25 mg three weeks on one week off, and weekly Dexamethasone 50m on Tuesdays.    1. Multiple Myeloma: last panel in November.  Stable, to continue with current treatment plan per Dr. MJulien Nordmann  Her CBC today is stable and she will continue with her current treatment plan of weekly Velcade/Dexamethasone, and three weeks on one week off Revlimid.  I reviewed with Dr. MJulien Nordmann and her next myeloma panel will be checked in February.  2. Diarrhea: she is taking imodium for  this.  This is longstanding issue for her.  I offered Lucrezia Starch, she declined today, but will consider if it worsens.  ? Adverse effect of treatment.  If not, would benefit from further eval.  Perry will continue to f/u weekly for labs and velcade, she will see Dr. Julien Nordmann in 4 weeks and 8 weeks.  Next myeloma panel due in 7 weeks.     All questions were answered. The patient knows to call the clinic with any problems, questions or concerns.  We can certainly see the patient much sooner if necessary.  A total of (30) minutes of face-to-face time was spent with this patient with greater than 50% of that time in counseling and care-coordination.  This note was electronically signed. Scot Dock, NP 01/07/2018

## 2018-01-07 NOTE — Assessment & Plan Note (Signed)
Brenda Conner is a 57 year old woman with multiple myeloma currently on treatment with weekly Velcade, Revlimid 25 mg three weeks on one week off, and weekly Dexamethasone 24m on Tuesdays.    1. Multiple Myeloma: last panel in November.  Stable, to continue with current treatment plan per Dr. MJulien Conner  Her CBC today is stable and she will continue with her current treatment plan of weekly Velcade/Dexamethasone, and three weeks on one week off Revlimid.  I reviewed with Dr. MJulien Conner and her next myeloma panel will be checked in February.  2. Diarrhea: she is taking imodium for this.  This is longstanding issue for her.  I offered qLucrezia Starch she declined today, but will consider if it worsens.  ? Adverse effect of treatment.  If not, would benefit from further eval.  JTracewill continue to f/u weekly for labs and velcade, she will see Dr. MJulien Nordmannin 4 weeks and 8 weeks.  Next myeloma panel due in 7 weeks.

## 2018-01-07 NOTE — Patient Instructions (Signed)
[facility] Discharge Instructions for Patients Receiving Chemotherapy  Today you received the following chemotherapy agents: Bortezomib (Velcade)  To help prevent nausea and vomiting after your treatment, we encourage you to take your nausea medication as directed.    If you develop nausea and vomiting that is not controlled by your nausea medication, call the clinic.   BELOW ARE SYMPTOMS THAT SHOULD BE REPORTED IMMEDIATELY:  *FEVER GREATER THAN 100.5 F  *CHILLS WITH OR WITHOUT FEVER  NAUSEA AND VOMITING THAT IS NOT CONTROLLED WITH YOUR NAUSEA MEDICATION  *UNUSUAL SHORTNESS OF BREATH  *UNUSUAL BRUISING OR BLEEDING  TENDERNESS IN MOUTH AND THROAT WITH OR WITHOUT PRESENCE OF ULCERS  *URINARY PROBLEMS  *BOWEL PROBLEMS  UNUSUAL RASH Items with * indicate a potential emergency and should be followed up as soon as possible.  Feel free to call the clinic should you have any questions or concerns. The clinic phone number is [phone].  Please show the CHEMO ALERT CARD at check-in to the Emergency Department and triage nurse.   

## 2018-01-09 ENCOUNTER — Telehealth: Payer: Self-pay | Admitting: Internal Medicine

## 2018-01-09 NOTE — Telephone Encounter (Signed)
Tried to call regarding 1/28 mailbox was full could not leave a message

## 2018-01-14 ENCOUNTER — Inpatient Hospital Stay: Payer: BLUE CROSS/BLUE SHIELD

## 2018-01-14 ENCOUNTER — Inpatient Hospital Stay: Payer: BLUE CROSS/BLUE SHIELD | Attending: Internal Medicine

## 2018-01-14 VITALS — BP 134/52 | HR 64 | Temp 98.3°F | Resp 18

## 2018-01-14 DIAGNOSIS — F419 Anxiety disorder, unspecified: Secondary | ICD-10-CM | POA: Insufficient documentation

## 2018-01-14 DIAGNOSIS — Z7901 Long term (current) use of anticoagulants: Secondary | ICD-10-CM | POA: Diagnosis not present

## 2018-01-14 DIAGNOSIS — Z79899 Other long term (current) drug therapy: Secondary | ICD-10-CM | POA: Diagnosis not present

## 2018-01-14 DIAGNOSIS — E119 Type 2 diabetes mellitus without complications: Secondary | ICD-10-CM | POA: Diagnosis not present

## 2018-01-14 DIAGNOSIS — K219 Gastro-esophageal reflux disease without esophagitis: Secondary | ICD-10-CM | POA: Diagnosis not present

## 2018-01-14 DIAGNOSIS — Z7984 Long term (current) use of oral hypoglycemic drugs: Secondary | ICD-10-CM | POA: Diagnosis not present

## 2018-01-14 DIAGNOSIS — Z5112 Encounter for antineoplastic immunotherapy: Secondary | ICD-10-CM | POA: Insufficient documentation

## 2018-01-14 DIAGNOSIS — Z791 Long term (current) use of non-steroidal anti-inflammatories (NSAID): Secondary | ICD-10-CM | POA: Diagnosis not present

## 2018-01-14 DIAGNOSIS — C9000 Multiple myeloma not having achieved remission: Secondary | ICD-10-CM | POA: Insufficient documentation

## 2018-01-14 LAB — CMP (CANCER CENTER ONLY)
ALT: 28 U/L (ref 0–44)
AST: 32 U/L (ref 15–41)
Albumin: 3.6 g/dL (ref 3.5–5.0)
Alkaline Phosphatase: 104 U/L (ref 38–126)
Anion gap: 11 (ref 5–15)
BILIRUBIN TOTAL: 0.8 mg/dL (ref 0.3–1.2)
BUN: 5 mg/dL — ABNORMAL LOW (ref 6–20)
CO2: 23 mmol/L (ref 22–32)
Calcium: 9 mg/dL (ref 8.9–10.3)
Chloride: 106 mmol/L (ref 98–111)
Creatinine: 0.76 mg/dL (ref 0.44–1.00)
GFR, Est AFR Am: >60 mL/min (ref >60)
Glucose, Bld: 137 mg/dL — ABNORMAL HIGH (ref 70–99)
Potassium: 3.5 mmol/L (ref 3.5–5.1)
Sodium: 140 mmol/L (ref 135–145)
Total Protein: 6.5 g/dL (ref 6.5–8.1)

## 2018-01-14 LAB — CBC WITH DIFFERENTIAL (CANCER CENTER ONLY)
Abs Immature Granulocytes: 0.01 10*3/uL (ref 0.00–0.07)
Basophils Absolute: 0 10*3/uL (ref 0.0–0.1)
Basophils Relative: 1 %
Eosinophils Absolute: 0.1 10*3/uL (ref 0.0–0.5)
Eosinophils Relative: 6 %
HCT: 30.5 % — ABNORMAL LOW (ref 36.0–46.0)
Hemoglobin: 9.7 g/dL — ABNORMAL LOW (ref 12.0–15.0)
Immature Granulocytes: 0 %
LYMPHS PCT: 22 %
Lymphs Abs: 0.5 10*3/uL — ABNORMAL LOW (ref 0.7–4.0)
MCH: 28.3 pg (ref 26.0–34.0)
MCHC: 31.8 g/dL (ref 30.0–36.0)
MCV: 88.9 fL (ref 80.0–100.0)
Monocytes Absolute: 0.3 10*3/uL (ref 0.1–1.0)
Monocytes Relative: 12 %
NEUTROS ABS: 1.4 10*3/uL — AB (ref 1.7–7.7)
Neutrophils Relative %: 59 %
Platelet Count: 105 10*3/uL — ABNORMAL LOW (ref 150–400)
RBC: 3.43 MIL/uL — AB (ref 3.87–5.11)
RDW: 15.9 % — ABNORMAL HIGH (ref 11.5–15.5)
WBC: 2.3 10*3/uL — AB (ref 4.0–10.5)
nRBC: 0 % (ref 0.0–0.2)

## 2018-01-14 MED ORDER — BORTEZOMIB CHEMO SQ INJECTION 3.5 MG (2.5MG/ML)
1.3000 mg/m2 | Freq: Once | INTRAMUSCULAR | Status: AC
  Administered 2018-01-14: 3 mg via SUBCUTANEOUS
  Filled 2018-01-14: qty 1.2

## 2018-01-14 MED ORDER — ONDANSETRON HCL 8 MG PO TABS
ORAL_TABLET | ORAL | Status: AC
  Filled 2018-01-14: qty 1

## 2018-01-14 MED ORDER — ONDANSETRON HCL 8 MG PO TABS
8.0000 mg | ORAL_TABLET | Freq: Once | ORAL | Status: AC
  Administered 2018-01-14: 8 mg via ORAL

## 2018-01-14 NOTE — Progress Notes (Signed)
Per Dr. Julien Nordmann ok to treat today with ANC 1.4

## 2018-01-14 NOTE — Patient Instructions (Signed)
[facility] Discharge Instructions for Patients Receiving Chemotherapy  Today you received the following chemotherapy agents: Bortezomib (Velcade)  To help prevent nausea and vomiting after your treatment, we encourage you to take your nausea medication as directed.    If you develop nausea and vomiting that is not controlled by your nausea medication, call the clinic.   BELOW ARE SYMPTOMS THAT SHOULD BE REPORTED IMMEDIATELY:  *FEVER GREATER THAN 100.5 F  *CHILLS WITH OR WITHOUT FEVER  NAUSEA AND VOMITING THAT IS NOT CONTROLLED WITH YOUR NAUSEA MEDICATION  *UNUSUAL SHORTNESS OF BREATH  *UNUSUAL BRUISING OR BLEEDING  TENDERNESS IN MOUTH AND THROAT WITH OR WITHOUT PRESENCE OF ULCERS  *URINARY PROBLEMS  *BOWEL PROBLEMS  UNUSUAL RASH Items with * indicate a potential emergency and should be followed up as soon as possible.  Feel free to call the clinic should you have any questions or concerns. The clinic phone number is [phone].  Please show the CHEMO ALERT CARD at check-in to the Emergency Department and triage nurse.   

## 2018-01-20 DIAGNOSIS — J3081 Allergic rhinitis due to animal (cat) (dog) hair and dander: Secondary | ICD-10-CM | POA: Diagnosis not present

## 2018-01-20 DIAGNOSIS — J3089 Other allergic rhinitis: Secondary | ICD-10-CM | POA: Diagnosis not present

## 2018-01-20 DIAGNOSIS — J4530 Mild persistent asthma, uncomplicated: Secondary | ICD-10-CM | POA: Diagnosis not present

## 2018-01-20 DIAGNOSIS — J301 Allergic rhinitis due to pollen: Secondary | ICD-10-CM | POA: Diagnosis not present

## 2018-01-21 ENCOUNTER — Inpatient Hospital Stay: Payer: BLUE CROSS/BLUE SHIELD

## 2018-01-21 VITALS — BP 149/76 | HR 57 | Temp 97.9°F | Resp 17

## 2018-01-21 DIAGNOSIS — C9000 Multiple myeloma not having achieved remission: Secondary | ICD-10-CM

## 2018-01-21 DIAGNOSIS — Z791 Long term (current) use of non-steroidal anti-inflammatories (NSAID): Secondary | ICD-10-CM | POA: Diagnosis not present

## 2018-01-21 DIAGNOSIS — Z5112 Encounter for antineoplastic immunotherapy: Secondary | ICD-10-CM | POA: Diagnosis not present

## 2018-01-21 DIAGNOSIS — E119 Type 2 diabetes mellitus without complications: Secondary | ICD-10-CM | POA: Diagnosis not present

## 2018-01-21 DIAGNOSIS — Z7984 Long term (current) use of oral hypoglycemic drugs: Secondary | ICD-10-CM | POA: Diagnosis not present

## 2018-01-21 DIAGNOSIS — K219 Gastro-esophageal reflux disease without esophagitis: Secondary | ICD-10-CM | POA: Diagnosis not present

## 2018-01-21 DIAGNOSIS — Z7901 Long term (current) use of anticoagulants: Secondary | ICD-10-CM | POA: Diagnosis not present

## 2018-01-21 DIAGNOSIS — F419 Anxiety disorder, unspecified: Secondary | ICD-10-CM | POA: Diagnosis not present

## 2018-01-21 DIAGNOSIS — Z79899 Other long term (current) drug therapy: Secondary | ICD-10-CM | POA: Diagnosis not present

## 2018-01-21 LAB — CBC WITH DIFFERENTIAL (CANCER CENTER ONLY)
Abs Immature Granulocytes: 0.01 10*3/uL (ref 0.00–0.07)
Basophils Absolute: 0.1 10*3/uL (ref 0.0–0.1)
Basophils Relative: 2 %
Eosinophils Absolute: 0.1 10*3/uL (ref 0.0–0.5)
Eosinophils Relative: 2 %
HCT: 31.8 % — ABNORMAL LOW (ref 36.0–46.0)
Hemoglobin: 10.1 g/dL — ABNORMAL LOW (ref 12.0–15.0)
Immature Granulocytes: 0 %
Lymphocytes Relative: 27 %
Lymphs Abs: 0.8 10*3/uL (ref 0.7–4.0)
MCH: 28.6 pg (ref 26.0–34.0)
MCHC: 31.8 g/dL (ref 30.0–36.0)
MCV: 90.1 fL (ref 80.0–100.0)
MONOS PCT: 9 %
Monocytes Absolute: 0.3 10*3/uL (ref 0.1–1.0)
NEUTROS PCT: 60 %
Neutro Abs: 1.7 10*3/uL (ref 1.7–7.7)
Platelet Count: 120 10*3/uL — ABNORMAL LOW (ref 150–400)
RBC: 3.53 MIL/uL — ABNORMAL LOW (ref 3.87–5.11)
RDW: 15.9 % — AB (ref 11.5–15.5)
WBC Count: 2.9 10*3/uL — ABNORMAL LOW (ref 4.0–10.5)
nRBC: 0 % (ref 0.0–0.2)

## 2018-01-21 LAB — CMP (CANCER CENTER ONLY)
ALT: 26 U/L (ref 0–44)
AST: 33 U/L (ref 15–41)
Albumin: 3.7 g/dL (ref 3.5–5.0)
Alkaline Phosphatase: 104 U/L (ref 38–126)
Anion gap: 9 (ref 5–15)
BUN: 7 mg/dL (ref 6–20)
CO2: 22 mmol/L (ref 22–32)
Calcium: 9 mg/dL (ref 8.9–10.3)
Chloride: 110 mmol/L (ref 98–111)
Creatinine: 0.73 mg/dL (ref 0.44–1.00)
GFR, Est AFR Am: >60 mL/min (ref >60)
GFR, Estimated: >60 mL/min (ref >60)
Glucose, Bld: 128 mg/dL — ABNORMAL HIGH (ref 70–99)
POTASSIUM: 3.8 mmol/L (ref 3.5–5.1)
Sodium: 141 mmol/L (ref 135–145)
Total Bilirubin: 0.6 mg/dL (ref 0.3–1.2)
Total Protein: 6.5 g/dL (ref 6.5–8.1)

## 2018-01-21 MED ORDER — ONDANSETRON HCL 8 MG PO TABS
ORAL_TABLET | ORAL | Status: AC
  Filled 2018-01-21: qty 1

## 2018-01-21 MED ORDER — ONDANSETRON HCL 8 MG PO TABS
8.0000 mg | ORAL_TABLET | Freq: Once | ORAL | Status: AC
  Administered 2018-01-21: 8 mg via ORAL

## 2018-01-21 MED ORDER — BORTEZOMIB CHEMO SQ INJECTION 3.5 MG (2.5MG/ML)
1.3000 mg/m2 | Freq: Once | INTRAMUSCULAR | Status: AC
  Administered 2018-01-21: 3 mg via SUBCUTANEOUS
  Filled 2018-01-21: qty 1.2

## 2018-01-21 NOTE — Patient Instructions (Signed)
[facility] Discharge Instructions for Patients Receiving Chemotherapy  Today you received the following chemotherapy agents Velcade.  To help prevent nausea and vomiting after your treatment, we encourage you to take your nausea medication as directed.  If you develop nausea and vomiting that is not controlled by your nausea medication, call the clinic.   BELOW ARE SYMPTOMS THAT SHOULD BE REPORTED IMMEDIATELY:  *FEVER GREATER THAN 100.5 F  *CHILLS WITH OR WITHOUT FEVER  NAUSEA AND VOMITING THAT IS NOT CONTROLLED WITH YOUR NAUSEA MEDICATION  *UNUSUAL SHORTNESS OF BREATH  *UNUSUAL BRUISING OR BLEEDING  TENDERNESS IN MOUTH AND THROAT WITH OR WITHOUT PRESENCE OF ULCERS  *URINARY PROBLEMS  *BOWEL PROBLEMS  UNUSUAL RASH Items with * indicate a potential emergency and should be followed up as soon as possible.  Feel free to call the clinic should you have any questions or concerns. The clinic phone number is [phone].  Please show the CHEMO ALERT CARD at check-in to the Emergency Department and triage nurse.   

## 2018-01-28 ENCOUNTER — Inpatient Hospital Stay: Payer: BLUE CROSS/BLUE SHIELD

## 2018-01-28 ENCOUNTER — Inpatient Hospital Stay: Payer: BLUE CROSS/BLUE SHIELD | Admitting: Medical

## 2018-01-28 VITALS — BP 115/61 | HR 63 | Temp 97.8°F | Resp 18

## 2018-01-28 DIAGNOSIS — C9000 Multiple myeloma not having achieved remission: Secondary | ICD-10-CM

## 2018-01-28 DIAGNOSIS — Z791 Long term (current) use of non-steroidal anti-inflammatories (NSAID): Secondary | ICD-10-CM | POA: Diagnosis not present

## 2018-01-28 DIAGNOSIS — Z7901 Long term (current) use of anticoagulants: Secondary | ICD-10-CM | POA: Diagnosis not present

## 2018-01-28 DIAGNOSIS — K219 Gastro-esophageal reflux disease without esophagitis: Secondary | ICD-10-CM | POA: Diagnosis not present

## 2018-01-28 DIAGNOSIS — Z79899 Other long term (current) drug therapy: Secondary | ICD-10-CM | POA: Diagnosis not present

## 2018-01-28 DIAGNOSIS — K1379 Other lesions of oral mucosa: Secondary | ICD-10-CM

## 2018-01-28 DIAGNOSIS — Z7984 Long term (current) use of oral hypoglycemic drugs: Secondary | ICD-10-CM | POA: Diagnosis not present

## 2018-01-28 DIAGNOSIS — Z5112 Encounter for antineoplastic immunotherapy: Secondary | ICD-10-CM | POA: Diagnosis not present

## 2018-01-28 DIAGNOSIS — F419 Anxiety disorder, unspecified: Secondary | ICD-10-CM | POA: Diagnosis not present

## 2018-01-28 DIAGNOSIS — E119 Type 2 diabetes mellitus without complications: Secondary | ICD-10-CM | POA: Diagnosis not present

## 2018-01-28 LAB — CBC WITH DIFFERENTIAL (CANCER CENTER ONLY)
Abs Immature Granulocytes: 0.01 10*3/uL (ref 0.00–0.07)
Basophils Absolute: 0 10*3/uL (ref 0.0–0.1)
Basophils Relative: 2 %
Eosinophils Absolute: 0.1 10*3/uL (ref 0.0–0.5)
Eosinophils Relative: 3 %
HEMATOCRIT: 32.9 % — AB (ref 36.0–46.0)
HEMOGLOBIN: 10.3 g/dL — AB (ref 12.0–15.0)
Immature Granulocytes: 0 %
LYMPHS ABS: 0.6 10*3/uL — AB (ref 0.7–4.0)
LYMPHS PCT: 24 %
MCH: 28 pg (ref 26.0–34.0)
MCHC: 31.3 g/dL (ref 30.0–36.0)
MCV: 89.4 fL (ref 80.0–100.0)
Monocytes Absolute: 0.2 10*3/uL (ref 0.1–1.0)
Monocytes Relative: 8 %
Neutro Abs: 1.7 10*3/uL (ref 1.7–7.7)
Neutrophils Relative %: 63 %
Platelet Count: 130 10*3/uL — ABNORMAL LOW (ref 150–400)
RBC: 3.68 MIL/uL — ABNORMAL LOW (ref 3.87–5.11)
RDW: 15.7 % — ABNORMAL HIGH (ref 11.5–15.5)
WBC Count: 2.6 10*3/uL — ABNORMAL LOW (ref 4.0–10.5)
nRBC: 0 % (ref 0.0–0.2)

## 2018-01-28 LAB — CMP (CANCER CENTER ONLY)
ALT: 36 U/L (ref 0–44)
AST: 36 U/L (ref 15–41)
Albumin: 3.8 g/dL (ref 3.5–5.0)
Alkaline Phosphatase: 120 U/L (ref 38–126)
Anion gap: 12 (ref 5–15)
BUN: 7 mg/dL (ref 6–20)
CO2: 23 mmol/L (ref 22–32)
Calcium: 9.3 mg/dL (ref 8.9–10.3)
Chloride: 103 mmol/L (ref 98–111)
Creatinine: 0.77 mg/dL (ref 0.44–1.00)
GFR, Est AFR Am: >60 mL/min (ref >60)
GFR, Estimated: >60 mL/min (ref >60)
Glucose, Bld: 146 mg/dL — ABNORMAL HIGH (ref 70–99)
Potassium: 4.1 mmol/L (ref 3.5–5.1)
Sodium: 138 mmol/L (ref 135–145)
Total Bilirubin: 0.7 mg/dL (ref 0.3–1.2)
Total Protein: 6.9 g/dL (ref 6.5–8.1)

## 2018-01-28 MED ORDER — ONDANSETRON HCL 8 MG PO TABS
8.0000 mg | ORAL_TABLET | Freq: Once | ORAL | Status: AC
  Administered 2018-01-28: 8 mg via ORAL

## 2018-01-28 MED ORDER — ONDANSETRON HCL 8 MG PO TABS
ORAL_TABLET | ORAL | Status: AC
  Filled 2018-01-28: qty 1

## 2018-01-28 MED ORDER — MAGIC MOUTHWASH W/LIDOCAINE: 5.0000 mL | Freq: Four times a day (QID) | ORAL | 1 refills | Status: AC | PRN

## 2018-01-28 MED ORDER — BORTEZOMIB CHEMO SQ INJECTION 3.5 MG (2.5MG/ML)
1.3000 mg/m2 | Freq: Once | INTRAMUSCULAR | Status: AC
  Administered 2018-01-28: 3 mg via SUBCUTANEOUS
  Filled 2018-01-28: qty 1.2

## 2018-02-04 ENCOUNTER — Inpatient Hospital Stay (HOSPITAL_BASED_OUTPATIENT_CLINIC_OR_DEPARTMENT_OTHER): Payer: BLUE CROSS/BLUE SHIELD | Admitting: Internal Medicine

## 2018-02-04 ENCOUNTER — Other Ambulatory Visit: Payer: Self-pay | Admitting: Internal Medicine

## 2018-02-04 ENCOUNTER — Encounter: Payer: Self-pay | Admitting: Internal Medicine

## 2018-02-04 ENCOUNTER — Inpatient Hospital Stay: Payer: BLUE CROSS/BLUE SHIELD

## 2018-02-04 VITALS — BP 135/63 | HR 67 | Temp 97.6°F | Resp 18 | Ht 67.0 in | Wt 247.3 lb

## 2018-02-04 DIAGNOSIS — F419 Anxiety disorder, unspecified: Secondary | ICD-10-CM | POA: Diagnosis not present

## 2018-02-04 DIAGNOSIS — Z7984 Long term (current) use of oral hypoglycemic drugs: Secondary | ICD-10-CM | POA: Diagnosis not present

## 2018-02-04 DIAGNOSIS — Z7901 Long term (current) use of anticoagulants: Secondary | ICD-10-CM | POA: Diagnosis not present

## 2018-02-04 DIAGNOSIS — C9000 Multiple myeloma not having achieved remission: Secondary | ICD-10-CM

## 2018-02-04 DIAGNOSIS — Z791 Long term (current) use of non-steroidal anti-inflammatories (NSAID): Secondary | ICD-10-CM | POA: Diagnosis not present

## 2018-02-04 DIAGNOSIS — Z79899 Other long term (current) drug therapy: Secondary | ICD-10-CM | POA: Diagnosis not present

## 2018-02-04 DIAGNOSIS — E119 Type 2 diabetes mellitus without complications: Secondary | ICD-10-CM | POA: Diagnosis not present

## 2018-02-04 DIAGNOSIS — K219 Gastro-esophageal reflux disease without esophagitis: Secondary | ICD-10-CM | POA: Diagnosis not present

## 2018-02-04 DIAGNOSIS — Z5111 Encounter for antineoplastic chemotherapy: Secondary | ICD-10-CM

## 2018-02-04 DIAGNOSIS — Z5112 Encounter for antineoplastic immunotherapy: Secondary | ICD-10-CM | POA: Diagnosis not present

## 2018-02-04 LAB — CBC WITH DIFFERENTIAL (CANCER CENTER ONLY)
Abs Immature Granulocytes: 0.02 10*3/uL (ref 0.00–0.07)
Basophils Absolute: 0 10*3/uL (ref 0.0–0.1)
Basophils Relative: 1 %
Eosinophils Absolute: 0.1 10*3/uL (ref 0.0–0.5)
Eosinophils Relative: 3 %
HCT: 33 % — ABNORMAL LOW (ref 36.0–46.0)
Hemoglobin: 10.2 g/dL — ABNORMAL LOW (ref 12.0–15.0)
Immature Granulocytes: 1 %
Lymphocytes Relative: 23 %
Lymphs Abs: 0.8 10*3/uL (ref 0.7–4.0)
MCH: 28 pg (ref 26.0–34.0)
MCHC: 30.9 g/dL (ref 30.0–36.0)
MCV: 90.7 fL (ref 80.0–100.0)
MONO ABS: 0.4 10*3/uL (ref 0.1–1.0)
Monocytes Relative: 12 %
Neutro Abs: 2.2 10*3/uL (ref 1.7–7.7)
Neutrophils Relative %: 60 %
Platelet Count: 118 10*3/uL — ABNORMAL LOW (ref 150–400)
RBC: 3.64 MIL/uL — ABNORMAL LOW (ref 3.87–5.11)
RDW: 15.9 % — ABNORMAL HIGH (ref 11.5–15.5)
WBC Count: 3.6 10*3/uL — ABNORMAL LOW (ref 4.0–10.5)
nRBC: 0 % (ref 0.0–0.2)

## 2018-02-04 LAB — CMP (CANCER CENTER ONLY)
ALT: 38 U/L (ref 0–44)
AST: 42 U/L — ABNORMAL HIGH (ref 15–41)
Albumin: 3.8 g/dL (ref 3.5–5.0)
Alkaline Phosphatase: 117 U/L (ref 38–126)
Anion gap: 12 (ref 5–15)
BUN: 6 mg/dL (ref 6–20)
CALCIUM: 9.1 mg/dL (ref 8.9–10.3)
CO2: 23 mmol/L (ref 22–32)
Chloride: 104 mmol/L (ref 98–111)
Creatinine: 0.79 mg/dL (ref 0.44–1.00)
GFR, Est AFR Am: >60 mL/min (ref >60)
GFR, Estimated: >60 mL/min (ref >60)
Glucose, Bld: 148 mg/dL — ABNORMAL HIGH (ref 70–99)
Potassium: 3.4 mmol/L — ABNORMAL LOW (ref 3.5–5.1)
Sodium: 139 mmol/L (ref 135–145)
Total Bilirubin: 0.5 mg/dL (ref 0.3–1.2)
Total Protein: 6.9 g/dL (ref 6.5–8.1)

## 2018-02-04 MED ORDER — ONDANSETRON HCL 8 MG PO TABS
8.0000 mg | ORAL_TABLET | Freq: Once | ORAL | Status: AC
  Administered 2018-02-04: 8 mg via ORAL

## 2018-02-04 MED ORDER — BORTEZOMIB CHEMO SQ INJECTION 3.5 MG (2.5MG/ML)
1.3000 mg/m2 | Freq: Once | INTRAMUSCULAR | Status: AC
  Administered 2018-02-04: 3 mg via SUBCUTANEOUS
  Filled 2018-02-04: qty 1.2

## 2018-02-04 MED ORDER — ONDANSETRON HCL 8 MG PO TABS
ORAL_TABLET | ORAL | Status: AC
  Filled 2018-02-04: qty 1

## 2018-02-04 NOTE — Progress Notes (Signed)
Brenda Conner was seen in the infusion room today.  She reported having oral soreness.  Her exam did not show any lesions.  The patient was given a prescription for Magic mouthwash.  Sandi Mealy, MHS, PA-C Physician Assistant

## 2018-02-04 NOTE — Progress Notes (Signed)
.[place] [phone]   [phone]  OFFICE PROGRESS NOTE  [name], MD [place] [address]  DIAGNOSIS: Plasma cell dyscrasia initially diagnosed as MGUS in September 2010, with additional symptoms suggestive of POEMS syndrome.   PRIOR THERAPY:  1) Velcade 1.3 MG/M2 subcutaneously with Decadron 40 mg by mouth on a weekly basis. First cycle 11/24/2013. She status post 31 weekly doses of treatment. 2) Velcade 1.3 MG/M2 subcutaneously and weekly basis with Decadron 40 mg by mouth weekly. First dose 02/01/2015. Status post 28 cycles. 3) Revlimid 25 mg by mouth daily for 21 days every 4 weeks with weekly Decadron 20 mg. started in 11/27/2015. Status post 3 cycles discontinued secondary to lack of response.  CURRENT THERAPY: Systemic treatment with Velcade 1.3 MG/KG weekly, Revlimid 25 mg by mouth daily for 21 days every 4 weeks in addition to Decadron 20 mg by mouth weekly. First dose 03/06/2016. Status post 20 cycles.   INTERVAL HISTORY: Brenda Conner 58 y.o. female returns to the clinic today for follow-up visit.  The patient is feeling fine today with no concerning complaints.  She denied having any chest pain, shortness of breath, cough or hemoptysis.  She denied having any nausea, vomiting, diarrhea or constipation.  She has no headache or visual changes.  She denied having any weight loss or night sweats.  She continues to tolerate her treatment with Velcade, Revlimid and Decadron fairly well.  The patient is here today for evaluation and repeat blood work before starting cycle #21.   MEDICAL HISTORY: Past Medical History:  Diagnosis Date  . Acid reflux   . Anxiety   . Asthma   . Cancer [facility])    waldenstroms/ macroglobinulemia  . Depression   . Depression   . Diabetes mellitus without complication ([place])   . Dysuria 02/28/2016  . Hypercholesteremia   . Hypertension   . Hypothyroidism   .  Macroglobulinemia ([place])    ? POEMS syndrome  . Multiple myeloma ([place])   . Obesity   . PONV (postoperative nausea and vomiting)   . Sleep apnea    CPAP at bedtime  . Sleep apnea     ALLERGIES:  is allergic to codeine; hydrocodone; lortab [hydrocodone-acetaminophen]; onion; shellfish allergy; and amoxicillin.  MEDICATIONS:  Current Outpatient Medications  Medication Sig Dispense Refill  . acyclovir (ZOVIRAX) 400 MG tablet Take 1 tablet (400 mg total) by mouth 2 (two) times daily. 180 tablet 1  . ALPRAZolam (XANAX) 0.5 MG tablet Take 1 tablet (0.5 mg total) by mouth at bedtime as needed for anxiety. 30 tablet 0  . Azelastine HCl 0.15 % SOLN as needed.    . Blood Glucose Monitoring Suppl (ONE TOUCH ULTRA MINI) W/DEVICE KIT See admin instructions. Reported on 01/25/2015  0  . Cetirizine HCl (ZYRTEC ALLERGY PO) Take by mouth daily.    [name] dexamethasone (DECADRON) 4 MG tablet Take 4 mg by mouth once a week. Patient takes 5 tablets 1 day week prior to [facility] appointment.    [name] dexamethasone (DECADRON) 4 MG tablet 5 TABLET BY MOUTH EVERY WEEK. START WITH THE FIRST DOSE OF CHEMOTHERAPY. 30 tablet 5  . DiphenhydrAMINE HCl (BENADRYL ALLERGY PO) Take by mouth as needed.    [name] EPIPEN 2-PAK 0.3 MG/0.3ML SOAJ injection Reported on 06/21/2015  1  . Fexofenadine HCl (ALLEGRA ALLERGY PO) Take by mouth daily.    . Fluticasone-Salmeterol (ADVAIR) 100-50 MCG/DOSE AEPB  Inhale 2 puffs into the lungs every 12 (twelve) hours.    [name] ibuprofen (ADVIL,MOTRIN) 100 MG tablet Take 100 mg by mouth every 6 (six) hours as needed. Reported on 05/31/2015    . lenalidomide (REVLIMID) 25 MG capsule TAKE 1 CAPSULE BY MOUTH ONCE DAILY 21 DAYS ON AND 7 DAYS OFF 21 capsule 0  . levothyroxine (SYNTHROID, LEVOTHROID) 150 MCG tablet Take 150 mcg by mouth daily before breakfast.    . lisinopril (PRINIVIL,ZESTRIL) 10 MG tablet Take 10 mg by mouth daily.  3  . loperamide (IMODIUM) 2 MG capsule Take 2 mg by mouth as needed for diarrhea or  loose stools.    . magic mouthwash w/lidocaine SOLN Take 5 mLs by mouth 4 (four) times daily as needed for mouth pain. Swish, Gargle, and spit 240 mL 1  . metFORMIN (GLUCOPHAGE) 1000 MG tablet Take 1,000 mg by mouth 2 (two) times daily.  3  . omeprazole (PRILOSEC) 40 MG capsule Take 40 mg by mouth 2 (two) times daily.     . ondansetron (ZOFRAN-ODT) 8 MG disintegrating tablet Take 1 tablet (8 mg total) by mouth every 8 (eight) hours as needed. Reported on 05/31/2015 20 tablet 2  . pravastatin (PRAVACHOL) 40 MG tablet Take 40 mg by mouth every evening.     [name] PROAIR HFA 108 (90 Base) MCG/ACT inhaler Reported on 06/21/2015  1  . sertraline (ZOLOFT) 50 MG tablet TAKE 3 TABLETS BY MOUTH EVERY DAY 270 tablet 3  . verapamil (CALAN-SR) 240 MG CR tablet Take 240 mg by mouth 2 (two) times daily.    [name] warfarin (COUMADIN) 2 MG tablet TAKE 1 TABLET BY MOUTH EVERY DAY 90 tablet 0   No current facility-administered medications for this visit.     SURGICAL HISTORY:  Past Surgical History:  Procedure Laterality Date  . ABLATION  09/2007   HTA and polyp resection  . BACK SURGERY  03/2004   herniation, L4-L5  . Bil Laprascopic knee surgery    . BONE MARROW BIOPSY     2011  . BONE MARROW BIOPSY  4/14  . CHOLECYSTECTOMY    . FOOT SURGERY  1998  . KNEE ARTHROSCOPY W/ MENISCAL REPAIR  10/10 ; 3/11  . LAPAROSCOPIC CHOLECYSTECTOMY  1997  . NASAL SINUS SURGERY  2004  . UTERINE FIBROID EMBOLIZATION      REVIEW OF SYSTEMS:  A comprehensive review of systems was negative.   PHYSICAL EXAMINATION: General appearance: alert, cooperative and no distress Head: Normocephalic, without obvious abnormality, atraumatic Neck: no adenopathy Lymph nodes: Cervical, supraclavicular, and axillary nodes normal. Resp: clear to auscultation bilaterally Back: symmetric, no curvature. ROM normal. No CVA tenderness. Cardio: regular rate and rhythm, S1, S2 normal, no murmur, click, rub or gallop GI: soft, non-tender; bowel sounds  normal; no masses,  no organomegaly Extremities: extremities normal, atraumatic, no cyanosis or edema  ECOG PERFORMANCE STATUS: 0 - Asymptomatic  Blood pressure 135/63, pulse 67, temperature 97.6 F (36.4 C), temperature source Oral, resp. rate 18, height 5' 7"  (1.702 m), weight 247 lb 4.8 oz (112.2 kg), SpO2 100 %.  LABORATORY DATA: Lab Results  Component Value Date   WBC 2.6 (L) 01/28/2018   HGB 10.3 (L) 01/28/2018   HCT 32.9 (L) 01/28/2018   MCV 89.4 01/28/2018   PLT 130 (L) 01/28/2018      Chemistry      Component Value Date/Time   NA 138 01/28/2018 0909   NA 138 11/14/2016 0824   K 4.1 01/28/2018 0909  K 4.3 11/14/2016 0824   CL 103 01/28/2018 0909   CL 104 04/07/2012 1415   CO2 23 01/28/2018 0909   CO2 23 11/14/2016 0824   BUN 7 01/28/2018 0909   BUN 9.1 11/14/2016 0824   CREATININE 0.77 01/28/2018 0909   CREATININE 0.7 11/14/2016 0824      Component Value Date/Time   CALCIUM 9.3 01/28/2018 0909   CALCIUM 9.6 11/14/2016 0824   ALKPHOS 120 01/28/2018 0909   ALKPHOS 94 11/14/2016 0824   AST 36 01/28/2018 0909   AST 28 11/14/2016 0824   ALT 36 01/28/2018 0909   ALT 24 11/14/2016 0824   BILITOT 0.7 01/28/2018 0909   BILITOT 0.41 11/14/2016 0824      ASSESSMENT AND PLAN:  This is a very pleasant 58 years old white female with multiple myeloma status post several chemotherapy regimens. The patient has been on treatment with subcutaneous weekly Velcade as well as Revlimid and DecadronStatus post 20 cycles.  The patient has been tolerating this treatment well with no concerning adverse effects. I recommended for her to continue her current treatment with the same regimen. I will see her back for follow-up visit in 4 weeks for evaluation after repeating myeloma panel. The patient was advised to call immediately if she has any concerning symptoms in the interval. All questions were answered. The patient knows to call the clinic with any problems, questions or  concerns. We can certainly see the patient much sooner if necessary.  Disclaimer: This note was dictated with voice recognition software. Similar sounding words can inadvertently be transcribed and may not be corrected upon review.

## 2018-02-05 ENCOUNTER — Telehealth: Payer: Self-pay | Admitting: Internal Medicine

## 2018-02-05 DIAGNOSIS — E039 Hypothyroidism, unspecified: Secondary | ICD-10-CM | POA: Diagnosis not present

## 2018-02-05 DIAGNOSIS — E1165 Type 2 diabetes mellitus with hyperglycemia: Secondary | ICD-10-CM | POA: Diagnosis not present

## 2018-02-05 DIAGNOSIS — E7800 Pure hypercholesterolemia, unspecified: Secondary | ICD-10-CM | POA: Diagnosis not present

## 2018-02-05 DIAGNOSIS — G609 Hereditary and idiopathic neuropathy, unspecified: Secondary | ICD-10-CM | POA: Diagnosis not present

## 2018-02-05 NOTE — Telephone Encounter (Signed)
appts already scheduled per treatment plan and 1/28 los - no additional appts to add.

## 2018-02-06 ENCOUNTER — Encounter: Payer: Self-pay | Admitting: Internal Medicine

## 2018-02-11 ENCOUNTER — Inpatient Hospital Stay: Payer: BLUE CROSS/BLUE SHIELD | Attending: Internal Medicine

## 2018-02-11 ENCOUNTER — Telehealth: Payer: Self-pay

## 2018-02-11 ENCOUNTER — Inpatient Hospital Stay: Payer: BLUE CROSS/BLUE SHIELD

## 2018-02-11 VITALS — BP 145/79 | HR 66 | Temp 98.1°F | Resp 19 | Wt 250.2 lb

## 2018-02-11 DIAGNOSIS — R530 Neoplastic (malignant) related fatigue: Secondary | ICD-10-CM | POA: Diagnosis not present

## 2018-02-11 DIAGNOSIS — K219 Gastro-esophageal reflux disease without esophagitis: Secondary | ICD-10-CM | POA: Insufficient documentation

## 2018-02-11 DIAGNOSIS — I10 Essential (primary) hypertension: Secondary | ICD-10-CM | POA: Insufficient documentation

## 2018-02-11 DIAGNOSIS — F419 Anxiety disorder, unspecified: Secondary | ICD-10-CM | POA: Diagnosis not present

## 2018-02-11 DIAGNOSIS — Z7901 Long term (current) use of anticoagulants: Secondary | ICD-10-CM | POA: Insufficient documentation

## 2018-02-11 DIAGNOSIS — C9000 Multiple myeloma not having achieved remission: Secondary | ICD-10-CM

## 2018-02-11 DIAGNOSIS — Z7984 Long term (current) use of oral hypoglycemic drugs: Secondary | ICD-10-CM | POA: Insufficient documentation

## 2018-02-11 DIAGNOSIS — Z5112 Encounter for antineoplastic immunotherapy: Secondary | ICD-10-CM | POA: Insufficient documentation

## 2018-02-11 DIAGNOSIS — E119 Type 2 diabetes mellitus without complications: Secondary | ICD-10-CM | POA: Diagnosis not present

## 2018-02-11 DIAGNOSIS — Z791 Long term (current) use of non-steroidal anti-inflammatories (NSAID): Secondary | ICD-10-CM | POA: Insufficient documentation

## 2018-02-11 DIAGNOSIS — Z79899 Other long term (current) drug therapy: Secondary | ICD-10-CM | POA: Insufficient documentation

## 2018-02-11 LAB — CMP (CANCER CENTER ONLY)
ALT: 54 U/L — ABNORMAL HIGH (ref 0–44)
AST: 56 U/L — ABNORMAL HIGH (ref 15–41)
Albumin: 3.6 g/dL (ref 3.5–5.0)
Alkaline Phosphatase: 131 U/L — ABNORMAL HIGH (ref 38–126)
Anion gap: 10 (ref 5–15)
BUN: 7 mg/dL (ref 6–20)
CO2: 25 mmol/L (ref 22–32)
Calcium: 8.9 mg/dL (ref 8.9–10.3)
Chloride: 104 mmol/L (ref 98–111)
Creatinine: 0.76 mg/dL (ref 0.44–1.00)
GFR, Est AFR Am: >60 mL/min (ref >60)
GFR, Estimated: >60 mL/min (ref >60)
Glucose, Bld: 166 mg/dL — ABNORMAL HIGH (ref 70–99)
Potassium: 3.7 mmol/L (ref 3.5–5.1)
Sodium: 139 mmol/L (ref 135–145)
Total Bilirubin: 0.8 mg/dL (ref 0.3–1.2)
Total Protein: 6.7 g/dL (ref 6.5–8.1)

## 2018-02-11 LAB — CBC WITH DIFFERENTIAL (CANCER CENTER ONLY)
Abs Immature Granulocytes: 0.01 10*3/uL (ref 0.00–0.07)
Basophils Absolute: 0 10*3/uL (ref 0.0–0.1)
Basophils Relative: 1 %
EOS PCT: 5 %
Eosinophils Absolute: 0.1 10*3/uL (ref 0.0–0.5)
HCT: 30.9 % — ABNORMAL LOW (ref 36.0–46.0)
HEMOGLOBIN: 10 g/dL — AB (ref 12.0–15.0)
Immature Granulocytes: 0 %
LYMPHS PCT: 22 %
Lymphs Abs: 0.5 10*3/uL — ABNORMAL LOW (ref 0.7–4.0)
MCH: 28.7 pg (ref 26.0–34.0)
MCHC: 32.4 g/dL (ref 30.0–36.0)
MCV: 88.5 fL (ref 80.0–100.0)
Monocytes Absolute: 0.3 10*3/uL (ref 0.1–1.0)
Monocytes Relative: 12 %
Neutro Abs: 1.5 10*3/uL — ABNORMAL LOW (ref 1.7–7.7)
Neutrophils Relative %: 60 %
Platelet Count: 112 10*3/uL — ABNORMAL LOW (ref 150–400)
RBC: 3.49 MIL/uL — ABNORMAL LOW (ref 3.87–5.11)
RDW: 16.2 % — ABNORMAL HIGH (ref 11.5–15.5)
WBC Count: 2.5 10*3/uL — ABNORMAL LOW (ref 4.0–10.5)
nRBC: 0 % (ref 0.0–0.2)

## 2018-02-11 MED ORDER — BORTEZOMIB CHEMO SQ INJECTION 3.5 MG (2.5MG/ML)
1.3000 mg/m2 | Freq: Once | INTRAMUSCULAR | Status: AC
  Administered 2018-02-11: 3 mg via SUBCUTANEOUS
  Filled 2018-02-11: qty 1.2

## 2018-02-11 MED ORDER — ONDANSETRON HCL 8 MG PO TABS
ORAL_TABLET | ORAL | Status: AC
  Filled 2018-02-11: qty 1

## 2018-02-11 MED ORDER — ONDANSETRON HCL 8 MG PO TABS
8.0000 mg | ORAL_TABLET | Freq: Once | ORAL | Status: AC
  Administered 2018-02-11: 8 mg via ORAL

## 2018-02-11 NOTE — Telephone Encounter (Signed)
Per patient request to move appointment by 1 day due to personal reasons. Per 2/4 walk in after her treatment today.

## 2018-02-11 NOTE — Patient Instructions (Signed)
[facility] Discharge Instructions for Patients Receiving Chemotherapy  Today you received the following chemotherapy agents Bortezomib (VELCADE).  To help prevent nausea and vomiting after your treatment, we encourage you to take your nausea medication as prescribed.   If you develop nausea and vomiting that is not controlled by your nausea medication, call the clinic.   BELOW ARE SYMPTOMS THAT SHOULD BE REPORTED IMMEDIATELY:  *FEVER GREATER THAN 100.5 F  *CHILLS WITH OR WITHOUT FEVER  NAUSEA AND VOMITING THAT IS NOT CONTROLLED WITH YOUR NAUSEA MEDICATION  *UNUSUAL SHORTNESS OF BREATH  *UNUSUAL BRUISING OR BLEEDING  TENDERNESS IN MOUTH AND THROAT WITH OR WITHOUT PRESENCE OF ULCERS  *URINARY PROBLEMS  *BOWEL PROBLEMS  UNUSUAL RASH Items with * indicate a potential emergency and should be followed up as soon as possible.  Feel free to call the clinic should you have any questions or concerns. The clinic phone number is [phone].  Please show the CHEMO ALERT CARD at check-in to the Emergency Department and triage nurse.   

## 2018-02-18 ENCOUNTER — Ambulatory Visit: Payer: BLUE CROSS/BLUE SHIELD

## 2018-02-18 ENCOUNTER — Other Ambulatory Visit: Payer: BLUE CROSS/BLUE SHIELD

## 2018-02-19 ENCOUNTER — Inpatient Hospital Stay: Payer: BLUE CROSS/BLUE SHIELD

## 2018-02-19 VITALS — BP 163/70 | HR 64 | Temp 97.8°F | Resp 18

## 2018-02-19 DIAGNOSIS — I10 Essential (primary) hypertension: Secondary | ICD-10-CM | POA: Diagnosis not present

## 2018-02-19 DIAGNOSIS — C9000 Multiple myeloma not having achieved remission: Secondary | ICD-10-CM

## 2018-02-19 DIAGNOSIS — Z791 Long term (current) use of non-steroidal anti-inflammatories (NSAID): Secondary | ICD-10-CM | POA: Diagnosis not present

## 2018-02-19 DIAGNOSIS — F419 Anxiety disorder, unspecified: Secondary | ICD-10-CM | POA: Diagnosis not present

## 2018-02-19 DIAGNOSIS — R530 Neoplastic (malignant) related fatigue: Secondary | ICD-10-CM | POA: Diagnosis not present

## 2018-02-19 DIAGNOSIS — Z7901 Long term (current) use of anticoagulants: Secondary | ICD-10-CM | POA: Diagnosis not present

## 2018-02-19 DIAGNOSIS — Z79899 Other long term (current) drug therapy: Secondary | ICD-10-CM | POA: Diagnosis not present

## 2018-02-19 DIAGNOSIS — E119 Type 2 diabetes mellitus without complications: Secondary | ICD-10-CM | POA: Diagnosis not present

## 2018-02-19 DIAGNOSIS — Z7984 Long term (current) use of oral hypoglycemic drugs: Secondary | ICD-10-CM | POA: Diagnosis not present

## 2018-02-19 DIAGNOSIS — K219 Gastro-esophageal reflux disease without esophagitis: Secondary | ICD-10-CM | POA: Diagnosis not present

## 2018-02-19 DIAGNOSIS — Z5112 Encounter for antineoplastic immunotherapy: Secondary | ICD-10-CM | POA: Diagnosis not present

## 2018-02-19 LAB — CMP (CANCER CENTER ONLY)
ALT: 33 U/L (ref 0–44)
AST: 43 U/L — AB (ref 15–41)
Albumin: 3.7 g/dL (ref 3.5–5.0)
Alkaline Phosphatase: 122 U/L (ref 38–126)
Anion gap: 10 (ref 5–15)
BUN: 8 mg/dL (ref 6–20)
CO2: 24 mmol/L (ref 22–32)
Calcium: 9.2 mg/dL (ref 8.9–10.3)
Chloride: 107 mmol/L (ref 98–111)
Creatinine: 0.79 mg/dL (ref 0.44–1.00)
GFR, Est AFR Am: >60 mL/min (ref >60)
GFR, Estimated: >60 mL/min (ref >60)
Glucose, Bld: 152 mg/dL — ABNORMAL HIGH (ref 70–99)
POTASSIUM: 4 mmol/L (ref 3.5–5.1)
Sodium: 141 mmol/L (ref 135–145)
Total Bilirubin: 0.5 mg/dL (ref 0.3–1.2)
Total Protein: 6.7 g/dL (ref 6.5–8.1)

## 2018-02-19 LAB — CBC WITH DIFFERENTIAL (CANCER CENTER ONLY)
ABS IMMATURE GRANULOCYTES: 0.01 10*3/uL (ref 0.00–0.07)
Basophils Absolute: 0.1 10*3/uL (ref 0.0–0.1)
Basophils Relative: 2 %
Eosinophils Absolute: 0.1 10*3/uL (ref 0.0–0.5)
Eosinophils Relative: 2 %
HCT: 32.7 % — ABNORMAL LOW (ref 36.0–46.0)
Hemoglobin: 10 g/dL — ABNORMAL LOW (ref 12.0–15.0)
Immature Granulocytes: 0 %
Lymphocytes Relative: 30 %
Lymphs Abs: 0.7 10*3/uL (ref 0.7–4.0)
MCH: 28.1 pg (ref 26.0–34.0)
MCHC: 30.6 g/dL (ref 30.0–36.0)
MCV: 91.9 fL (ref 80.0–100.0)
Monocytes Absolute: 0.3 10*3/uL (ref 0.1–1.0)
Monocytes Relative: 14 %
Neutro Abs: 1.3 10*3/uL — ABNORMAL LOW (ref 1.7–7.7)
Neutrophils Relative %: 52 %
Platelet Count: 119 10*3/uL — ABNORMAL LOW (ref 150–400)
RBC: 3.56 MIL/uL — ABNORMAL LOW (ref 3.87–5.11)
RDW: 16 % — ABNORMAL HIGH (ref 11.5–15.5)
WBC Count: 2.5 10*3/uL — ABNORMAL LOW (ref 4.0–10.5)
nRBC: 0 % (ref 0.0–0.2)

## 2018-02-19 MED ORDER — BORTEZOMIB CHEMO SQ INJECTION 3.5 MG (2.5MG/ML)
1.3000 mg/m2 | Freq: Once | INTRAMUSCULAR | Status: AC
  Administered 2018-02-19: 3 mg via SUBCUTANEOUS
  Filled 2018-02-19: qty 1.2

## 2018-02-19 MED ORDER — ONDANSETRON HCL 8 MG PO TABS
8.0000 mg | ORAL_TABLET | Freq: Once | ORAL | Status: AC
  Administered 2018-02-19: 8 mg via ORAL

## 2018-02-19 MED ORDER — ONDANSETRON HCL 8 MG PO TABS
ORAL_TABLET | ORAL | Status: AC
  Filled 2018-02-19: qty 1

## 2018-02-19 NOTE — Progress Notes (Signed)
Per Julien Nordmann patient OK to treat with today's labs

## 2018-02-19 NOTE — Patient Instructions (Signed)
[facility] Discharge Instructions for Patients Receiving Chemotherapy  Today you received the following chemotherapy agents Bortezomib (VELCADE).  To help prevent nausea and vomiting after your treatment, we encourage you to take your nausea medication as prescribed.   If you develop nausea and vomiting that is not controlled by your nausea medication, call the clinic.   BELOW ARE SYMPTOMS THAT SHOULD BE REPORTED IMMEDIATELY:  *FEVER GREATER THAN 100.5 F  *CHILLS WITH OR WITHOUT FEVER  NAUSEA AND VOMITING THAT IS NOT CONTROLLED WITH YOUR NAUSEA MEDICATION  *UNUSUAL SHORTNESS OF BREATH  *UNUSUAL BRUISING OR BLEEDING  TENDERNESS IN MOUTH AND THROAT WITH OR WITHOUT PRESENCE OF ULCERS  *URINARY PROBLEMS  *BOWEL PROBLEMS  UNUSUAL RASH Items with * indicate a potential emergency and should be followed up as soon as possible.  Feel free to call the clinic should you have any questions or concerns. The clinic phone number is [phone].  Please show the CHEMO ALERT CARD at check-in to the Emergency Department and triage nurse.   

## 2018-02-25 ENCOUNTER — Inpatient Hospital Stay: Payer: BLUE CROSS/BLUE SHIELD

## 2018-02-25 ENCOUNTER — Other Ambulatory Visit: Payer: Self-pay | Admitting: Internal Medicine

## 2018-02-25 VITALS — BP 152/59 | HR 63 | Temp 97.9°F | Resp 16

## 2018-02-25 DIAGNOSIS — K219 Gastro-esophageal reflux disease without esophagitis: Secondary | ICD-10-CM | POA: Diagnosis not present

## 2018-02-25 DIAGNOSIS — Z7984 Long term (current) use of oral hypoglycemic drugs: Secondary | ICD-10-CM | POA: Diagnosis not present

## 2018-02-25 DIAGNOSIS — C9000 Multiple myeloma not having achieved remission: Secondary | ICD-10-CM | POA: Diagnosis not present

## 2018-02-25 DIAGNOSIS — I10 Essential (primary) hypertension: Secondary | ICD-10-CM | POA: Diagnosis not present

## 2018-02-25 DIAGNOSIS — Z791 Long term (current) use of non-steroidal anti-inflammatories (NSAID): Secondary | ICD-10-CM | POA: Diagnosis not present

## 2018-02-25 DIAGNOSIS — Z79899 Other long term (current) drug therapy: Secondary | ICD-10-CM | POA: Diagnosis not present

## 2018-02-25 DIAGNOSIS — F419 Anxiety disorder, unspecified: Secondary | ICD-10-CM | POA: Diagnosis not present

## 2018-02-25 DIAGNOSIS — Z7901 Long term (current) use of anticoagulants: Secondary | ICD-10-CM | POA: Diagnosis not present

## 2018-02-25 DIAGNOSIS — Z5112 Encounter for antineoplastic immunotherapy: Secondary | ICD-10-CM | POA: Diagnosis not present

## 2018-02-25 DIAGNOSIS — E119 Type 2 diabetes mellitus without complications: Secondary | ICD-10-CM | POA: Diagnosis not present

## 2018-02-25 DIAGNOSIS — R530 Neoplastic (malignant) related fatigue: Secondary | ICD-10-CM | POA: Diagnosis not present

## 2018-02-25 LAB — CMP (CANCER CENTER ONLY)
ALT: 34 U/L (ref 0–44)
AST: 33 U/L (ref 15–41)
Albumin: 3.8 g/dL (ref 3.5–5.0)
Alkaline Phosphatase: 118 U/L (ref 38–126)
Anion gap: 11 (ref 5–15)
BUN: 6 mg/dL (ref 6–20)
CO2: 24 mmol/L (ref 22–32)
Calcium: 9.2 mg/dL (ref 8.9–10.3)
Chloride: 102 mmol/L (ref 98–111)
Creatinine: 0.79 mg/dL (ref 0.44–1.00)
GFR, Est AFR Am: >60 mL/min (ref >60)
GFR, Estimated: >60 mL/min (ref >60)
Glucose, Bld: 163 mg/dL — ABNORMAL HIGH (ref 70–99)
Potassium: 4.2 mmol/L (ref 3.5–5.1)
Sodium: 137 mmol/L (ref 135–145)
Total Bilirubin: 0.7 mg/dL (ref 0.3–1.2)
Total Protein: 6.9 g/dL (ref 6.5–8.1)

## 2018-02-25 LAB — LACTATE DEHYDROGENASE: LDH: 116 U/L (ref 98–192)

## 2018-02-25 LAB — CBC WITH DIFFERENTIAL (CANCER CENTER ONLY)
Abs Immature Granulocytes: 0.02 10*3/uL (ref 0.00–0.07)
Basophils Absolute: 0 10*3/uL (ref 0.0–0.1)
Basophils Relative: 1 %
EOS PCT: 3 %
Eosinophils Absolute: 0.1 10*3/uL (ref 0.0–0.5)
HCT: 32.2 % — ABNORMAL LOW (ref 36.0–46.0)
HEMOGLOBIN: 10 g/dL — AB (ref 12.0–15.0)
Immature Granulocytes: 1 %
Lymphocytes Relative: 25 %
Lymphs Abs: 0.6 10*3/uL — ABNORMAL LOW (ref 0.7–4.0)
MCH: 27.7 pg (ref 26.0–34.0)
MCHC: 31.1 g/dL (ref 30.0–36.0)
MCV: 89.2 fL (ref 80.0–100.0)
Monocytes Absolute: 0.2 10*3/uL (ref 0.1–1.0)
Monocytes Relative: 6 %
Neutro Abs: 1.7 10*3/uL (ref 1.7–7.7)
Neutrophils Relative %: 64 %
Platelet Count: 114 10*3/uL — ABNORMAL LOW (ref 150–400)
RBC: 3.61 MIL/uL — ABNORMAL LOW (ref 3.87–5.11)
RDW: 15.7 % — ABNORMAL HIGH (ref 11.5–15.5)
WBC Count: 2.6 10*3/uL — ABNORMAL LOW (ref 4.0–10.5)
nRBC: 0 % (ref 0.0–0.2)

## 2018-02-25 MED ORDER — BORTEZOMIB CHEMO SQ INJECTION 3.5 MG (2.5MG/ML)
1.3000 mg/m2 | Freq: Once | INTRAMUSCULAR | Status: AC
  Administered 2018-02-25: 3 mg via SUBCUTANEOUS
  Filled 2018-02-25: qty 1.2

## 2018-02-25 MED ORDER — ONDANSETRON HCL 8 MG PO TABS
8.0000 mg | ORAL_TABLET | Freq: Once | ORAL | Status: AC
  Administered 2018-02-25: 8 mg via ORAL

## 2018-02-25 MED ORDER — ONDANSETRON HCL 8 MG PO TABS
ORAL_TABLET | ORAL | Status: AC
  Filled 2018-02-25: qty 1

## 2018-02-25 NOTE — Patient Instructions (Signed)
[facility] Discharge Instructions for Patients Receiving Chemotherapy  Today you received the following chemotherapy agents Velcade.  To help prevent nausea and vomiting after your treatment, we encourage you to take your nausea medication as directed.  If you develop nausea and vomiting that is not controlled by your nausea medication, call the clinic.   BELOW ARE SYMPTOMS THAT SHOULD BE REPORTED IMMEDIATELY:  *FEVER GREATER THAN 100.5 F  *CHILLS WITH OR WITHOUT FEVER  NAUSEA AND VOMITING THAT IS NOT CONTROLLED WITH YOUR NAUSEA MEDICATION  *UNUSUAL SHORTNESS OF BREATH  *UNUSUAL BRUISING OR BLEEDING  TENDERNESS IN MOUTH AND THROAT WITH OR WITHOUT PRESENCE OF ULCERS  *URINARY PROBLEMS  *BOWEL PROBLEMS  UNUSUAL RASH Items with * indicate a potential emergency and should be followed up as soon as possible.  Feel free to call the clinic should you have any questions or concerns. The clinic phone number is [phone].  Please show the CHEMO ALERT CARD at check-in to the Emergency Department and triage nurse.   

## 2018-02-26 LAB — KAPPA/LAMBDA LIGHT CHAINS
Kappa free light chain: 10.2 mg/L (ref 3.3–19.4)
Kappa, lambda light chain ratio: 0.02 — ABNORMAL LOW (ref 0.26–1.65)
Lambda free light chains: 584.4 mg/L — ABNORMAL HIGH (ref 5.7–26.3)

## 2018-02-26 LAB — BETA 2 MICROGLOBULIN, SERUM: Beta-2 Microglobulin: 1.5 mg/L (ref 0.6–2.4)

## 2018-02-26 LAB — IGG, IGA, IGM
IgA: 93 mg/dL (ref 87–352)
IgG (Immunoglobin G), Serum: 402 mg/dL — ABNORMAL LOW (ref 700–1600)
IgM (Immunoglobulin M), Srm: 598 mg/dL — ABNORMAL HIGH (ref 26–217)

## 2018-03-04 ENCOUNTER — Inpatient Hospital Stay: Payer: BLUE CROSS/BLUE SHIELD

## 2018-03-04 ENCOUNTER — Inpatient Hospital Stay: Payer: BLUE CROSS/BLUE SHIELD | Admitting: Internal Medicine

## 2018-03-04 ENCOUNTER — Encounter: Payer: Self-pay | Admitting: Internal Medicine

## 2018-03-04 ENCOUNTER — Telehealth: Payer: Self-pay | Admitting: Internal Medicine

## 2018-03-04 VITALS — BP 163/79 | HR 76 | Temp 98.7°F | Resp 18 | Ht 67.0 in | Wt 248.6 lb

## 2018-03-04 DIAGNOSIS — Z791 Long term (current) use of non-steroidal anti-inflammatories (NSAID): Secondary | ICD-10-CM

## 2018-03-04 DIAGNOSIS — R530 Neoplastic (malignant) related fatigue: Secondary | ICD-10-CM

## 2018-03-04 DIAGNOSIS — Z7901 Long term (current) use of anticoagulants: Secondary | ICD-10-CM | POA: Diagnosis not present

## 2018-03-04 DIAGNOSIS — E119 Type 2 diabetes mellitus without complications: Secondary | ICD-10-CM | POA: Diagnosis not present

## 2018-03-04 DIAGNOSIS — F419 Anxiety disorder, unspecified: Secondary | ICD-10-CM

## 2018-03-04 DIAGNOSIS — C9000 Multiple myeloma not having achieved remission: Secondary | ICD-10-CM

## 2018-03-04 DIAGNOSIS — K219 Gastro-esophageal reflux disease without esophagitis: Secondary | ICD-10-CM | POA: Diagnosis not present

## 2018-03-04 DIAGNOSIS — Z7984 Long term (current) use of oral hypoglycemic drugs: Secondary | ICD-10-CM | POA: Diagnosis not present

## 2018-03-04 DIAGNOSIS — E1165 Type 2 diabetes mellitus with hyperglycemia: Secondary | ICD-10-CM | POA: Diagnosis not present

## 2018-03-04 DIAGNOSIS — I10 Essential (primary) hypertension: Secondary | ICD-10-CM | POA: Insufficient documentation

## 2018-03-04 DIAGNOSIS — E039 Hypothyroidism, unspecified: Secondary | ICD-10-CM | POA: Diagnosis not present

## 2018-03-04 DIAGNOSIS — Z79899 Other long term (current) drug therapy: Secondary | ICD-10-CM | POA: Diagnosis not present

## 2018-03-04 DIAGNOSIS — Z5111 Encounter for antineoplastic chemotherapy: Secondary | ICD-10-CM

## 2018-03-04 DIAGNOSIS — Z5112 Encounter for antineoplastic immunotherapy: Secondary | ICD-10-CM | POA: Diagnosis not present

## 2018-03-04 LAB — CMP (CANCER CENTER ONLY)
ALBUMIN: 3.8 g/dL (ref 3.5–5.0)
ALT: 37 U/L (ref 0–44)
AST: 44 U/L — ABNORMAL HIGH (ref 15–41)
Alkaline Phosphatase: 120 U/L (ref 38–126)
Anion gap: 12 (ref 5–15)
BUN: 7 mg/dL (ref 6–20)
CO2: 22 mmol/L (ref 22–32)
Calcium: 9 mg/dL (ref 8.9–10.3)
Chloride: 103 mmol/L (ref 98–111)
Creatinine: 0.79 mg/dL (ref 0.44–1.00)
GFR, Est AFR Am: >60 mL/min (ref >60)
GFR, Estimated: >60 mL/min (ref >60)
GLUCOSE: 184 mg/dL — AB (ref 70–99)
Potassium: 3.5 mmol/L (ref 3.5–5.1)
Sodium: 137 mmol/L (ref 135–145)
Total Bilirubin: 0.7 mg/dL (ref 0.3–1.2)
Total Protein: 6.9 g/dL (ref 6.5–8.1)

## 2018-03-04 LAB — CBC WITH DIFFERENTIAL (CANCER CENTER ONLY)
ABS IMMATURE GRANULOCYTES: 0.01 10*3/uL (ref 0.00–0.07)
BASOS ABS: 0 10*3/uL (ref 0.0–0.1)
Basophils Relative: 1 %
Eosinophils Absolute: 0.1 10*3/uL (ref 0.0–0.5)
Eosinophils Relative: 4 %
HCT: 31.2 % — ABNORMAL LOW (ref 36.0–46.0)
Hemoglobin: 9.9 g/dL — ABNORMAL LOW (ref 12.0–15.0)
IMMATURE GRANULOCYTES: 0 %
Lymphocytes Relative: 18 %
Lymphs Abs: 0.5 10*3/uL — ABNORMAL LOW (ref 0.7–4.0)
MCH: 28.4 pg (ref 26.0–34.0)
MCHC: 31.7 g/dL (ref 30.0–36.0)
MCV: 89.7 fL (ref 80.0–100.0)
Monocytes Absolute: 0.3 10*3/uL (ref 0.1–1.0)
Monocytes Relative: 11 %
NRBC: 0 % (ref 0.0–0.2)
Neutro Abs: 1.8 10*3/uL (ref 1.7–7.7)
Neutrophils Relative %: 66 %
Platelet Count: 103 10*3/uL — ABNORMAL LOW (ref 150–400)
RBC: 3.48 MIL/uL — ABNORMAL LOW (ref 3.87–5.11)
RDW: 16.2 % — ABNORMAL HIGH (ref 11.5–15.5)
WBC Count: 2.8 10*3/uL — ABNORMAL LOW (ref 4.0–10.5)

## 2018-03-04 MED ORDER — ONDANSETRON HCL 8 MG PO TABS
8.0000 mg | ORAL_TABLET | Freq: Once | ORAL | Status: AC
  Administered 2018-03-04: 8 mg via ORAL

## 2018-03-04 MED ORDER — ONDANSETRON HCL 8 MG PO TABS
ORAL_TABLET | ORAL | Status: AC
  Filled 2018-03-04: qty 1

## 2018-03-04 MED ORDER — BORTEZOMIB CHEMO SQ INJECTION 3.5 MG (2.5MG/ML)
1.3000 mg/m2 | Freq: Once | INTRAMUSCULAR | Status: AC
  Administered 2018-03-04: 3 mg via SUBCUTANEOUS
  Filled 2018-03-04: qty 1.2

## 2018-03-04 NOTE — Telephone Encounter (Signed)
Scheduled appt per 2/25 los. ° °Printed calendar and avs. °

## 2018-03-04 NOTE — Progress Notes (Signed)
.[place] [phone]   [phone]  OFFICE PROGRESS NOTE  [name], MD [name] [address]  DIAGNOSIS: Plasma cell dyscrasia initially diagnosed as MGUS in September 2010, with additional symptoms suggestive of POEMS syndrome.   PRIOR THERAPY:  1) Velcade 1.3 MG/M2 subcutaneously with Decadron 40 mg by mouth on a weekly basis. First cycle 11/24/2013. She status post 31 weekly doses of treatment. 2) Velcade 1.3 MG/M2 subcutaneously and weekly basis with Decadron 40 mg by mouth weekly. First dose 02/01/2015. Status post 28 cycles. 3) Revlimid 25 mg by mouth daily for 21 days every 4 weeks with weekly Decadron 20 mg. started in 11/27/2015. Status post 3 cycles discontinued secondary to lack of response.  CURRENT THERAPY: Systemic treatment with Velcade 1.3 MG/KG weekly, Revlimid 25 mg by mouth daily for 21 days every 4 weeks in addition to Decadron 20 mg by mouth weekly. First dose 03/06/2016. Status post 24 cycles.   INTERVAL HISTORY: Brenda Conner 58 y.o. female returns to the clinic today for follow-up visit.  The patient is feeling fine today with no concerning complaints except for mild fatigue.  She denied having any chest pain, shortness of breath, cough or hemoptysis.  She has no fever or chills.  She denied having any nausea, vomiting, diarrhea or constipation.  She has no significant weight loss or night sweats.  She continues to tolerate her treatment with Velcade, Revlimid and Decadron fairly well.  The patient had repeat myeloma panel performed recently and she is here for evaluation and discussion of her lab results.   MEDICAL HISTORY: Past Medical History:  Diagnosis Date  . Acid reflux   . Anxiety   . Asthma   . Cancer [facility])    waldenstroms/ macroglobinulemia  . Depression   . Depression   . Diabetes mellitus without complication ([place])   . Dysuria 02/28/2016  . Hypercholesteremia   .  Hypertension   . Hypothyroidism   . Macroglobulinemia ([place])    ? POEMS syndrome  . Multiple myeloma ([place])   . Obesity   . PONV (postoperative nausea and vomiting)   . Sleep apnea    CPAP at bedtime  . Sleep apnea     ALLERGIES:  is allergic to codeine; hydrocodone; lortab [hydrocodone-acetaminophen]; onion; shellfish allergy; and amoxicillin.  MEDICATIONS:  Current Outpatient Medications  Medication Sig Dispense Refill  . acyclovir (ZOVIRAX) 400 MG tablet Take 1 tablet (400 mg total) by mouth 2 (two) times daily. 180 tablet 1  . ALPRAZolam (XANAX) 0.5 MG tablet Take 1 tablet (0.5 mg total) by mouth at bedtime as needed for anxiety. 30 tablet 0  . Azelastine HCl 0.15 % SOLN as needed.    . Blood Glucose Monitoring Suppl (ONE TOUCH ULTRA MINI) W/DEVICE KIT See admin instructions. Reported on 01/25/2015  0  . Cetirizine HCl (ZYRTEC ALLERGY PO) Take by mouth daily.    [name] dexamethasone (DECADRON) 4 MG tablet Take 4 mg by mouth once a week. Patient takes 5 tablets 1 day week prior to [facility] appointment.    [name] dexamethasone (DECADRON) 4 MG tablet 5 TABLET BY MOUTH EVERY WEEK. START WITH THE FIRST DOSE OF CHEMOTHERAPY. 30 tablet 5  . DiphenhydrAMINE HCl (BENADRYL ALLERGY PO) Take by mouth as needed.    [name] EPIPEN 2-PAK 0.3 MG/0.3ML SOAJ injection Reported on 06/21/2015  1  . Fexofenadine HCl (ALLEGRA ALLERGY PO) Take by mouth daily.    [name]  Fluticasone-Salmeterol (ADVAIR) 100-50 MCG/DOSE AEPB Inhale 2 puffs into the lungs every 12 (twelve) hours.    [name] ibuprofen (ADVIL,MOTRIN) 100 MG tablet Take 100 mg by mouth every 6 (six) hours as needed. Reported on 05/31/2015    . lenalidomide (REVLIMID) 25 MG capsule TAKE 1 CAPSULE BY MOUTH ONCE DAILY 21 DAYS ON AND 7 DAYS OFF 21 capsule 0  . levothyroxine (SYNTHROID, LEVOTHROID) 150 MCG tablet Take 150 mcg by mouth daily before breakfast.    . lisinopril (PRINIVIL,ZESTRIL) 10 MG tablet Take 10 mg by mouth daily.  3  . loperamide (IMODIUM) 2 MG capsule Take 2 mg  by mouth as needed for diarrhea or loose stools.    . magic mouthwash w/lidocaine SOLN Take 5 mLs by mouth 4 (four) times daily as needed for mouth pain. Swish, Gargle, and spit 240 mL 1  . metFORMIN (GLUCOPHAGE) 1000 MG tablet Take 1,000 mg by mouth 2 (two) times daily.  3  . omeprazole (PRILOSEC) 40 MG capsule Take 40 mg by mouth 2 (two) times daily.     . ondansetron (ZOFRAN-ODT) 8 MG disintegrating tablet Take 1 tablet (8 mg total) by mouth every 8 (eight) hours as needed. Reported on 05/31/2015 20 tablet 2  . pravastatin (PRAVACHOL) 40 MG tablet Take 40 mg by mouth every evening.     [name] PROAIR HFA 108 (90 Base) MCG/ACT inhaler Reported on 06/21/2015  1  . sertraline (ZOLOFT) 50 MG tablet TAKE 3 TABLETS BY MOUTH EVERY DAY 270 tablet 3  . verapamil (CALAN-SR) 240 MG CR tablet Take 240 mg by mouth 2 (two) times daily.    [name] warfarin (COUMADIN) 2 MG tablet TAKE 1 TABLET BY MOUTH EVERY DAY 90 tablet 0   No current facility-administered medications for this visit.     SURGICAL HISTORY:  Past Surgical History:  Procedure Laterality Date  . ABLATION  09/2007   HTA and polyp resection  . BACK SURGERY  03/2004   herniation, L4-L5  . Bil Laprascopic knee surgery    . BONE MARROW BIOPSY     2011  . BONE MARROW BIOPSY  4/14  . CHOLECYSTECTOMY    . FOOT SURGERY  1998  . KNEE ARTHROSCOPY W/ MENISCAL REPAIR  10/10 ; 3/11  . LAPAROSCOPIC CHOLECYSTECTOMY  1997  . NASAL SINUS SURGERY  2004  . UTERINE FIBROID EMBOLIZATION      REVIEW OF SYSTEMS:  Constitutional: positive for fatigue Eyes: negative Ears, nose, mouth, throat, and face: negative Respiratory: negative Cardiovascular: negative Gastrointestinal: negative Genitourinary:negative Integument/breast: negative Hematologic/lymphatic: negative Musculoskeletal:negative Neurological: negative Behavioral/Psych: negative Endocrine: negative Allergic/Immunologic: negative   PHYSICAL EXAMINATION: General appearance: alert, cooperative,  fatigued and no distress Head: Normocephalic, without obvious abnormality, atraumatic Neck: no adenopathy Lymph nodes: Cervical, supraclavicular, and axillary nodes normal. Resp: clear to auscultation bilaterally Back: symmetric, no curvature. ROM normal. No CVA tenderness. Cardio: regular rate and rhythm, S1, S2 normal, no murmur, click, rub or gallop GI: soft, non-tender; bowel sounds normal; no masses,  no organomegaly Extremities: extremities normal, atraumatic, no cyanosis or edema Neurologic: Alert and oriented X 3, normal strength and tone. Normal symmetric reflexes. Normal coordination and gait  ECOG PERFORMANCE STATUS: 1 - Symptomatic but completely ambulatory  Blood pressure (!) 163/79, pulse 76, temperature 98.7 F (37.1 C), temperature source Oral, resp. rate 18, height _0  (1.702 m), weight 248 lb 9.6 oz (112.8 kg), SpO2 100 %.  LABORATORY DATA: Lab Results  Component Value Date   WBC 2.8 (L) 03/04/2018  HGB 9.9 (L) 03/04/2018   HCT 31.2 (L) 03/04/2018   MCV 89.7 03/04/2018   PLT 103 (L) 03/04/2018      Chemistry      Component Value Date/Time   NA 137 02/25/2018 0804   NA 138 11/14/2016 0824   K 4.2 02/25/2018 0804   K 4.3 11/14/2016 0824   CL 102 02/25/2018 0804   CL 104 04/07/2012 1415   CO2 24 02/25/2018 0804   CO2 23 11/14/2016 0824   BUN 6 02/25/2018 0804   BUN 9.1 11/14/2016 0824   CREATININE 0.79 02/25/2018 0804   CREATININE 0.7 11/14/2016 0824      Component Value Date/Time   CALCIUM 9.2 02/25/2018 0804   CALCIUM 9.6 11/14/2016 0824   ALKPHOS 118 02/25/2018 0804   ALKPHOS 94 11/14/2016 0824   AST 33 02/25/2018 0804   AST 28 11/14/2016 0824   ALT 34 02/25/2018 0804   ALT 24 11/14/2016 0824   BILITOT 0.7 02/25/2018 0804   BILITOT 0.41 11/14/2016 0824      ASSESSMENT AND PLAN:  This is a very pleasant 58 years old white female with multiple myeloma status post several chemotherapy regimens. The patient has been on treatment with  subcutaneous weekly Velcade as well as Revlimid and DecadronStatus post 24 cycles.  The patient has been tolerating this treatment well with no concerning adverse effects except for fatigue. She had repeat myeloma panel that showed a stable disease except for further elevation of the free lambda light chain. I recommended for the patient to continue her current treatment with Revlimid, Velcade and Decadron as scheduled. For hypertension she was advised to take her blood pressure medication as prescribed and to reconsult with her primary care physician for adjustment of her medication if needed. The patient will come back for follow-up visit in 4 weeks for evaluation before starting cycle #26. She was advised to call immediately if she has any concerning symptoms in the interval. All questions were answered. The patient knows to call the clinic with any problems, questions or concerns. We can certainly see the patient much sooner if necessary.  Disclaimer: This note was dictated with voice recognition software. Similar sounding words can inadvertently be transcribed and may not be corrected upon review.

## 2018-03-04 NOTE — Patient Instructions (Signed)
[facility] Discharge Instructions for Patients Receiving Chemotherapy  Today you received the following chemotherapy agents Velcade.  To help prevent nausea and vomiting after your treatment, we encourage you to take your nausea medication as directed.  If you develop nausea and vomiting that is not controlled by your nausea medication, call the clinic.   BELOW ARE SYMPTOMS THAT SHOULD BE REPORTED IMMEDIATELY:  *FEVER GREATER THAN 100.5 F  *CHILLS WITH OR WITHOUT FEVER  NAUSEA AND VOMITING THAT IS NOT CONTROLLED WITH YOUR NAUSEA MEDICATION  *UNUSUAL SHORTNESS OF BREATH  *UNUSUAL BRUISING OR BLEEDING  TENDERNESS IN MOUTH AND THROAT WITH OR WITHOUT PRESENCE OF ULCERS  *URINARY PROBLEMS  *BOWEL PROBLEMS  UNUSUAL RASH Items with * indicate a potential emergency and should be followed up as soon as possible.  Feel free to call the clinic should you have any questions or concerns. The clinic phone number is [phone].  Please show the CHEMO ALERT CARD at check-in to the Emergency Department and triage nurse.   

## 2018-03-06 DIAGNOSIS — J301 Allergic rhinitis due to pollen: Secondary | ICD-10-CM | POA: Diagnosis not present

## 2018-03-07 ENCOUNTER — Other Ambulatory Visit: Payer: Self-pay | Admitting: Internal Medicine

## 2018-03-07 DIAGNOSIS — C9000 Multiple myeloma not having achieved remission: Secondary | ICD-10-CM

## 2018-03-07 DIAGNOSIS — J3089 Other allergic rhinitis: Secondary | ICD-10-CM | POA: Diagnosis not present

## 2018-03-07 DIAGNOSIS — J3081 Allergic rhinitis due to animal (cat) (dog) hair and dander: Secondary | ICD-10-CM | POA: Diagnosis not present

## 2018-03-10 ENCOUNTER — Other Ambulatory Visit: Payer: Self-pay | Admitting: Medical Oncology

## 2018-03-10 DIAGNOSIS — C9000 Multiple myeloma not having achieved remission: Secondary | ICD-10-CM

## 2018-03-10 MED ORDER — LENALIDOMIDE 25 MG PO CAPS: ORAL_CAPSULE | ORAL | 0 refills | Status: DC

## 2018-03-10 NOTE — Progress Notes (Signed)
Faxed auth number for revlimid to [facility]

## 2018-03-11 ENCOUNTER — Inpatient Hospital Stay: Payer: BLUE CROSS/BLUE SHIELD

## 2018-03-11 ENCOUNTER — Inpatient Hospital Stay: Payer: BLUE CROSS/BLUE SHIELD | Attending: Internal Medicine

## 2018-03-11 VITALS — BP 126/58 | HR 65 | Temp 98.5°F | Resp 18

## 2018-03-11 DIAGNOSIS — Z7901 Long term (current) use of anticoagulants: Secondary | ICD-10-CM | POA: Diagnosis not present

## 2018-03-11 DIAGNOSIS — E119 Type 2 diabetes mellitus without complications: Secondary | ICD-10-CM | POA: Insufficient documentation

## 2018-03-11 DIAGNOSIS — K219 Gastro-esophageal reflux disease without esophagitis: Secondary | ICD-10-CM | POA: Diagnosis not present

## 2018-03-11 DIAGNOSIS — Z5112 Encounter for antineoplastic immunotherapy: Secondary | ICD-10-CM | POA: Diagnosis not present

## 2018-03-11 DIAGNOSIS — Z7984 Long term (current) use of oral hypoglycemic drugs: Secondary | ICD-10-CM | POA: Diagnosis not present

## 2018-03-11 DIAGNOSIS — Z79899 Other long term (current) drug therapy: Secondary | ICD-10-CM | POA: Insufficient documentation

## 2018-03-11 DIAGNOSIS — C9000 Multiple myeloma not having achieved remission: Secondary | ICD-10-CM

## 2018-03-11 DIAGNOSIS — R5383 Other fatigue: Secondary | ICD-10-CM | POA: Diagnosis not present

## 2018-03-11 DIAGNOSIS — F419 Anxiety disorder, unspecified: Secondary | ICD-10-CM | POA: Insufficient documentation

## 2018-03-11 DIAGNOSIS — I10 Essential (primary) hypertension: Secondary | ICD-10-CM | POA: Diagnosis not present

## 2018-03-11 DIAGNOSIS — Z791 Long term (current) use of non-steroidal anti-inflammatories (NSAID): Secondary | ICD-10-CM | POA: Insufficient documentation

## 2018-03-11 LAB — CBC WITH DIFFERENTIAL (CANCER CENTER ONLY)
Abs Immature Granulocytes: 0.02 10*3/uL (ref 0.00–0.07)
Basophils Absolute: 0 10*3/uL (ref 0.0–0.1)
Basophils Relative: 1 %
Eosinophils Absolute: 0.1 10*3/uL (ref 0.0–0.5)
Eosinophils Relative: 5 %
HCT: 30.3 % — ABNORMAL LOW (ref 36.0–46.0)
HEMOGLOBIN: 9.7 g/dL — AB (ref 12.0–15.0)
Immature Granulocytes: 1 %
LYMPHS PCT: 23 %
Lymphs Abs: 0.6 10*3/uL — ABNORMAL LOW (ref 0.7–4.0)
MCH: 28.4 pg (ref 26.0–34.0)
MCHC: 32 g/dL (ref 30.0–36.0)
MCV: 88.9 fL (ref 80.0–100.0)
Monocytes Absolute: 0.3 10*3/uL (ref 0.1–1.0)
Monocytes Relative: 12 %
Neutro Abs: 1.5 10*3/uL — ABNORMAL LOW (ref 1.7–7.7)
Neutrophils Relative %: 58 %
Platelet Count: 114 10*3/uL — ABNORMAL LOW (ref 150–400)
RBC: 3.41 MIL/uL — ABNORMAL LOW (ref 3.87–5.11)
RDW: 16.4 % — ABNORMAL HIGH (ref 11.5–15.5)
WBC Count: 2.5 10*3/uL — ABNORMAL LOW (ref 4.0–10.5)
nRBC: 0 % (ref 0.0–0.2)

## 2018-03-11 LAB — CMP (CANCER CENTER ONLY)
ALT: 37 U/L (ref 0–44)
AST: 37 U/L (ref 15–41)
Albumin: 3.6 g/dL (ref 3.5–5.0)
Alkaline Phosphatase: 127 U/L — ABNORMAL HIGH (ref 38–126)
Anion gap: 14 (ref 5–15)
BUN: 8 mg/dL (ref 6–20)
CO2: 21 mmol/L — ABNORMAL LOW (ref 22–32)
Calcium: 8.7 mg/dL — ABNORMAL LOW (ref 8.9–10.3)
Chloride: 104 mmol/L (ref 98–111)
Creatinine: 0.78 mg/dL (ref 0.44–1.00)
GFR, Est AFR Am: >60 mL/min (ref >60)
GFR, Estimated: >60 mL/min (ref >60)
GLUCOSE: 181 mg/dL — AB (ref 70–99)
Potassium: 3.5 mmol/L (ref 3.5–5.1)
Sodium: 139 mmol/L (ref 135–145)
Total Bilirubin: 0.8 mg/dL (ref 0.3–1.2)
Total Protein: 6.8 g/dL (ref 6.5–8.1)

## 2018-03-11 MED ORDER — BORTEZOMIB CHEMO SQ INJECTION 3.5 MG (2.5MG/ML)
1.3000 mg/m2 | Freq: Once | INTRAMUSCULAR | Status: AC
  Administered 2018-03-11: 3 mg via SUBCUTANEOUS
  Filled 2018-03-11: qty 1.2

## 2018-03-11 MED ORDER — ONDANSETRON HCL 8 MG PO TABS
ORAL_TABLET | ORAL | Status: AC
  Filled 2018-03-11: qty 1

## 2018-03-11 MED ORDER — ONDANSETRON HCL 8 MG PO TABS
8.0000 mg | ORAL_TABLET | Freq: Once | ORAL | Status: AC
  Administered 2018-03-11: 8 mg via ORAL

## 2018-03-11 NOTE — Patient Instructions (Signed)
[facility] Discharge Instructions for Patients Receiving Chemotherapy  Today you received the following chemotherapy agents Velcade.  To help prevent nausea and vomiting after your treatment, we encourage you to take your nausea medication as directed.  If you develop nausea and vomiting that is not controlled by your nausea medication, call the clinic.   BELOW ARE SYMPTOMS THAT SHOULD BE REPORTED IMMEDIATELY:  *FEVER GREATER THAN 100.5 F  *CHILLS WITH OR WITHOUT FEVER  NAUSEA AND VOMITING THAT IS NOT CONTROLLED WITH YOUR NAUSEA MEDICATION  *UNUSUAL SHORTNESS OF BREATH  *UNUSUAL BRUISING OR BLEEDING  TENDERNESS IN MOUTH AND THROAT WITH OR WITHOUT PRESENCE OF ULCERS  *URINARY PROBLEMS  *BOWEL PROBLEMS  UNUSUAL RASH Items with * indicate a potential emergency and should be followed up as soon as possible.  Feel free to call the clinic should you have any questions or concerns. The clinic phone number is [phone].  Please show the CHEMO ALERT CARD at check-in to the Emergency Department and triage nurse.   

## 2018-03-18 ENCOUNTER — Inpatient Hospital Stay: Payer: BLUE CROSS/BLUE SHIELD

## 2018-03-18 VITALS — BP 153/72 | HR 59 | Temp 98.4°F | Resp 17

## 2018-03-18 DIAGNOSIS — C9000 Multiple myeloma not having achieved remission: Secondary | ICD-10-CM

## 2018-03-18 DIAGNOSIS — R5383 Other fatigue: Secondary | ICD-10-CM | POA: Diagnosis not present

## 2018-03-18 DIAGNOSIS — Z7901 Long term (current) use of anticoagulants: Secondary | ICD-10-CM | POA: Diagnosis not present

## 2018-03-18 DIAGNOSIS — Z5112 Encounter for antineoplastic immunotherapy: Secondary | ICD-10-CM | POA: Diagnosis not present

## 2018-03-18 DIAGNOSIS — F419 Anxiety disorder, unspecified: Secondary | ICD-10-CM | POA: Diagnosis not present

## 2018-03-18 DIAGNOSIS — Z791 Long term (current) use of non-steroidal anti-inflammatories (NSAID): Secondary | ICD-10-CM | POA: Diagnosis not present

## 2018-03-18 DIAGNOSIS — Z7984 Long term (current) use of oral hypoglycemic drugs: Secondary | ICD-10-CM | POA: Diagnosis not present

## 2018-03-18 DIAGNOSIS — I10 Essential (primary) hypertension: Secondary | ICD-10-CM | POA: Diagnosis not present

## 2018-03-18 DIAGNOSIS — Z79899 Other long term (current) drug therapy: Secondary | ICD-10-CM | POA: Diagnosis not present

## 2018-03-18 DIAGNOSIS — E119 Type 2 diabetes mellitus without complications: Secondary | ICD-10-CM | POA: Diagnosis not present

## 2018-03-18 DIAGNOSIS — K219 Gastro-esophageal reflux disease without esophagitis: Secondary | ICD-10-CM | POA: Diagnosis not present

## 2018-03-18 LAB — CBC WITH DIFFERENTIAL (CANCER CENTER ONLY)
Abs Immature Granulocytes: 0.01 10*3/uL (ref 0.00–0.07)
Basophils Absolute: 0.1 10*3/uL (ref 0.0–0.1)
Basophils Relative: 2 %
Eosinophils Absolute: 0 10*3/uL (ref 0.0–0.5)
Eosinophils Relative: 1 %
HCT: 29.5 % — ABNORMAL LOW (ref 36.0–46.0)
HEMOGLOBIN: 9.4 g/dL — AB (ref 12.0–15.0)
Immature Granulocytes: 0 %
Lymphocytes Relative: 24 %
Lymphs Abs: 0.7 10*3/uL (ref 0.7–4.0)
MCH: 29 pg (ref 26.0–34.0)
MCHC: 31.9 g/dL (ref 30.0–36.0)
MCV: 91 fL (ref 80.0–100.0)
MONO ABS: 0.3 10*3/uL (ref 0.1–1.0)
Monocytes Relative: 12 %
Neutro Abs: 1.6 10*3/uL — ABNORMAL LOW (ref 1.7–7.7)
Neutrophils Relative %: 61 %
Platelet Count: 100 10*3/uL — ABNORMAL LOW (ref 150–400)
RBC: 3.24 MIL/uL — ABNORMAL LOW (ref 3.87–5.11)
RDW: 16.4 % — ABNORMAL HIGH (ref 11.5–15.5)
WBC Count: 2.7 10*3/uL — ABNORMAL LOW (ref 4.0–10.5)
nRBC: 0 % (ref 0.0–0.2)

## 2018-03-18 LAB — CMP (CANCER CENTER ONLY)
ALT: 31 U/L (ref 0–44)
AST: 42 U/L — ABNORMAL HIGH (ref 15–41)
Albumin: 3.5 g/dL (ref 3.5–5.0)
Alkaline Phosphatase: 110 U/L (ref 38–126)
Anion gap: 11 (ref 5–15)
BUN: 8 mg/dL (ref 6–20)
CALCIUM: 8.8 mg/dL — AB (ref 8.9–10.3)
CO2: 21 mmol/L — ABNORMAL LOW (ref 22–32)
Chloride: 106 mmol/L (ref 98–111)
Creatinine: 0.76 mg/dL (ref 0.44–1.00)
GFR, Est AFR Am: >60 mL/min (ref >60)
GFR, Estimated: >60 mL/min (ref >60)
Glucose, Bld: 170 mg/dL — ABNORMAL HIGH (ref 70–99)
Potassium: 4.2 mmol/L (ref 3.5–5.1)
Sodium: 138 mmol/L (ref 135–145)
Total Bilirubin: 0.6 mg/dL (ref 0.3–1.2)
Total Protein: 6.5 g/dL (ref 6.5–8.1)

## 2018-03-18 MED ORDER — BORTEZOMIB CHEMO SQ INJECTION 3.5 MG (2.5MG/ML)
1.3000 mg/m2 | Freq: Once | INTRAMUSCULAR | Status: AC
  Administered 2018-03-18: 3 mg via SUBCUTANEOUS
  Filled 2018-03-18: qty 1.2

## 2018-03-18 MED ORDER — ONDANSETRON HCL 8 MG PO TABS
8.0000 mg | ORAL_TABLET | Freq: Once | ORAL | Status: AC
  Administered 2018-03-18: 8 mg via ORAL

## 2018-03-18 MED ORDER — ONDANSETRON HCL 8 MG PO TABS
ORAL_TABLET | ORAL | Status: AC
  Filled 2018-03-18: qty 1

## 2018-03-18 NOTE — Patient Instructions (Signed)
[facility] Discharge Instructions for Patients Receiving Chemotherapy  Today you received the following chemotherapy agents Velcade.  To help prevent nausea and vomiting after your treatment, we encourage you to take your nausea medication as directed.  If you develop nausea and vomiting that is not controlled by your nausea medication, call the clinic.   BELOW ARE SYMPTOMS THAT SHOULD BE REPORTED IMMEDIATELY:  *FEVER GREATER THAN 100.5 F  *CHILLS WITH OR WITHOUT FEVER  NAUSEA AND VOMITING THAT IS NOT CONTROLLED WITH YOUR NAUSEA MEDICATION  *UNUSUAL SHORTNESS OF BREATH  *UNUSUAL BRUISING OR BLEEDING  TENDERNESS IN MOUTH AND THROAT WITH OR WITHOUT PRESENCE OF ULCERS  *URINARY PROBLEMS  *BOWEL PROBLEMS  UNUSUAL RASH Items with * indicate a potential emergency and should be followed up as soon as possible.  Feel free to call the clinic should you have any questions or concerns. The clinic phone number is [phone].  Please show the CHEMO ALERT CARD at check-in to the Emergency Department and triage nurse.   

## 2018-03-25 ENCOUNTER — Other Ambulatory Visit: Payer: Self-pay

## 2018-03-25 ENCOUNTER — Inpatient Hospital Stay: Payer: BLUE CROSS/BLUE SHIELD

## 2018-03-25 VITALS — BP 122/45 | HR 70 | Temp 97.9°F | Resp 18

## 2018-03-25 DIAGNOSIS — C9000 Multiple myeloma not having achieved remission: Secondary | ICD-10-CM | POA: Diagnosis not present

## 2018-03-25 DIAGNOSIS — Z79899 Other long term (current) drug therapy: Secondary | ICD-10-CM | POA: Diagnosis not present

## 2018-03-25 DIAGNOSIS — Z5112 Encounter for antineoplastic immunotherapy: Secondary | ICD-10-CM | POA: Diagnosis not present

## 2018-03-25 DIAGNOSIS — R5383 Other fatigue: Secondary | ICD-10-CM | POA: Diagnosis not present

## 2018-03-25 DIAGNOSIS — E119 Type 2 diabetes mellitus without complications: Secondary | ICD-10-CM | POA: Diagnosis not present

## 2018-03-25 DIAGNOSIS — K219 Gastro-esophageal reflux disease without esophagitis: Secondary | ICD-10-CM | POA: Diagnosis not present

## 2018-03-25 DIAGNOSIS — Z7984 Long term (current) use of oral hypoglycemic drugs: Secondary | ICD-10-CM | POA: Diagnosis not present

## 2018-03-25 DIAGNOSIS — F419 Anxiety disorder, unspecified: Secondary | ICD-10-CM | POA: Diagnosis not present

## 2018-03-25 DIAGNOSIS — Z7901 Long term (current) use of anticoagulants: Secondary | ICD-10-CM | POA: Diagnosis not present

## 2018-03-25 DIAGNOSIS — Z791 Long term (current) use of non-steroidal anti-inflammatories (NSAID): Secondary | ICD-10-CM | POA: Diagnosis not present

## 2018-03-25 DIAGNOSIS — I10 Essential (primary) hypertension: Secondary | ICD-10-CM | POA: Diagnosis not present

## 2018-03-25 LAB — CBC WITH DIFFERENTIAL (CANCER CENTER ONLY)
ABS IMMATURE GRANULOCYTES: 0.02 10*3/uL (ref 0.00–0.07)
Basophils Absolute: 0 10*3/uL (ref 0.0–0.1)
Basophils Relative: 1 %
Eosinophils Absolute: 0.1 10*3/uL (ref 0.0–0.5)
Eosinophils Relative: 3 %
HCT: 32.3 % — ABNORMAL LOW (ref 36.0–46.0)
Hemoglobin: 10 g/dL — ABNORMAL LOW (ref 12.0–15.0)
IMMATURE GRANULOCYTES: 1 %
Lymphocytes Relative: 25 %
Lymphs Abs: 0.6 10*3/uL — ABNORMAL LOW (ref 0.7–4.0)
MCH: 28.7 pg (ref 26.0–34.0)
MCHC: 31 g/dL (ref 30.0–36.0)
MCV: 92.6 fL (ref 80.0–100.0)
MONOS PCT: 6 %
Monocytes Absolute: 0.2 10*3/uL (ref 0.1–1.0)
Neutro Abs: 1.5 10*3/uL — ABNORMAL LOW (ref 1.7–7.7)
Neutrophils Relative %: 64 %
Platelet Count: 118 10*3/uL — ABNORMAL LOW (ref 150–400)
RBC: 3.49 MIL/uL — ABNORMAL LOW (ref 3.87–5.11)
RDW: 16.5 % — ABNORMAL HIGH (ref 11.5–15.5)
WBC Count: 2.3 10*3/uL — ABNORMAL LOW (ref 4.0–10.5)
nRBC: 0 % (ref 0.0–0.2)

## 2018-03-25 LAB — CMP (CANCER CENTER ONLY)
ALT: 34 U/L (ref 0–44)
ANION GAP: 14 (ref 5–15)
AST: 32 U/L (ref 15–41)
Albumin: 3.8 g/dL (ref 3.5–5.0)
Alkaline Phosphatase: 130 U/L — ABNORMAL HIGH (ref 38–126)
BUN: 7 mg/dL (ref 6–20)
CALCIUM: 9 mg/dL (ref 8.9–10.3)
CO2: 23 mmol/L (ref 22–32)
Chloride: 102 mmol/L (ref 98–111)
Creatinine: 0.81 mg/dL (ref 0.44–1.00)
GFR, Est AFR Am: >60 mL/min (ref >60)
GFR, Estimated: >60 mL/min (ref >60)
Glucose, Bld: 179 mg/dL — ABNORMAL HIGH (ref 70–99)
Potassium: 3.9 mmol/L (ref 3.5–5.1)
Sodium: 139 mmol/L (ref 135–145)
Total Bilirubin: 0.7 mg/dL (ref 0.3–1.2)
Total Protein: 7 g/dL (ref 6.5–8.1)

## 2018-03-25 MED ORDER — ONDANSETRON HCL 8 MG PO TABS
8.0000 mg | ORAL_TABLET | Freq: Once | ORAL | Status: AC
  Administered 2018-03-25: 8 mg via ORAL

## 2018-03-25 MED ORDER — BORTEZOMIB CHEMO SQ INJECTION 3.5 MG (2.5MG/ML)
1.3000 mg/m2 | Freq: Once | INTRAMUSCULAR | Status: AC
  Administered 2018-03-25: 3 mg via SUBCUTANEOUS
  Filled 2018-03-25: qty 1.2

## 2018-03-25 MED ORDER — ONDANSETRON HCL 8 MG PO TABS
ORAL_TABLET | ORAL | Status: AC
  Filled 2018-03-25: qty 1

## 2018-03-25 NOTE — Patient Instructions (Signed)
[facility] Discharge Instructions for Patients Receiving Chemotherapy  Today you received the following chemotherapy agents Velcade.  To help prevent nausea and vomiting after your treatment, we encourage you to take your nausea medication as directed.  If you develop nausea and vomiting that is not controlled by your nausea medication, call the clinic.   BELOW ARE SYMPTOMS THAT SHOULD BE REPORTED IMMEDIATELY:  *FEVER GREATER THAN 100.5 F  *CHILLS WITH OR WITHOUT FEVER  NAUSEA AND VOMITING THAT IS NOT CONTROLLED WITH YOUR NAUSEA MEDICATION  *UNUSUAL SHORTNESS OF BREATH  *UNUSUAL BRUISING OR BLEEDING  TENDERNESS IN MOUTH AND THROAT WITH OR WITHOUT PRESENCE OF ULCERS  *URINARY PROBLEMS  *BOWEL PROBLEMS  UNUSUAL RASH Items with * indicate a potential emergency and should be followed up as soon as possible.  Feel free to call the clinic should you have any questions or concerns. The clinic phone number is [phone].  Please show the CHEMO ALERT CARD at check-in to the Emergency Department and triage nurse.   

## 2018-03-31 ENCOUNTER — Inpatient Hospital Stay: Payer: BLUE CROSS/BLUE SHIELD | Admitting: Internal Medicine

## 2018-03-31 ENCOUNTER — Other Ambulatory Visit: Payer: Self-pay

## 2018-03-31 ENCOUNTER — Encounter: Payer: Self-pay | Admitting: Internal Medicine

## 2018-03-31 ENCOUNTER — Telehealth: Payer: Self-pay | Admitting: Internal Medicine

## 2018-03-31 ENCOUNTER — Inpatient Hospital Stay: Payer: BLUE CROSS/BLUE SHIELD

## 2018-03-31 VITALS — BP 118/67 | HR 67 | Temp 97.6°F | Resp 18 | Ht 67.0 in | Wt 249.3 lb

## 2018-03-31 DIAGNOSIS — Z7984 Long term (current) use of oral hypoglycemic drugs: Secondary | ICD-10-CM

## 2018-03-31 DIAGNOSIS — Z5111 Encounter for antineoplastic chemotherapy: Secondary | ICD-10-CM

## 2018-03-31 DIAGNOSIS — Z791 Long term (current) use of non-steroidal anti-inflammatories (NSAID): Secondary | ICD-10-CM

## 2018-03-31 DIAGNOSIS — Z7901 Long term (current) use of anticoagulants: Secondary | ICD-10-CM | POA: Diagnosis not present

## 2018-03-31 DIAGNOSIS — Z79899 Other long term (current) drug therapy: Secondary | ICD-10-CM | POA: Diagnosis not present

## 2018-03-31 DIAGNOSIS — E119 Type 2 diabetes mellitus without complications: Secondary | ICD-10-CM | POA: Diagnosis not present

## 2018-03-31 DIAGNOSIS — C9000 Multiple myeloma not having achieved remission: Secondary | ICD-10-CM | POA: Diagnosis not present

## 2018-03-31 DIAGNOSIS — R5383 Other fatigue: Secondary | ICD-10-CM | POA: Diagnosis not present

## 2018-03-31 DIAGNOSIS — I10 Essential (primary) hypertension: Secondary | ICD-10-CM | POA: Diagnosis not present

## 2018-03-31 DIAGNOSIS — F419 Anxiety disorder, unspecified: Secondary | ICD-10-CM | POA: Diagnosis not present

## 2018-03-31 DIAGNOSIS — Z5112 Encounter for antineoplastic immunotherapy: Secondary | ICD-10-CM | POA: Diagnosis not present

## 2018-03-31 DIAGNOSIS — K219 Gastro-esophageal reflux disease without esophagitis: Secondary | ICD-10-CM

## 2018-03-31 LAB — CMP (CANCER CENTER ONLY)
ALT: 38 U/L (ref 0–44)
AST: 37 U/L (ref 15–41)
Albumin: 3.8 g/dL (ref 3.5–5.0)
Alkaline Phosphatase: 130 U/L — ABNORMAL HIGH (ref 38–126)
Anion gap: 12 (ref 5–15)
BILIRUBIN TOTAL: 0.8 mg/dL (ref 0.3–1.2)
BUN: 6 mg/dL (ref 6–20)
CALCIUM: 8.9 mg/dL (ref 8.9–10.3)
CO2: 22 mmol/L (ref 22–32)
Chloride: 101 mmol/L (ref 98–111)
Creatinine: 0.8 mg/dL (ref 0.44–1.00)
GFR, Est AFR Am: >60 mL/min (ref >60)
Glucose, Bld: 244 mg/dL — ABNORMAL HIGH (ref 70–99)
Potassium: 4 mmol/L (ref 3.5–5.1)
Sodium: 135 mmol/L (ref 135–145)
TOTAL PROTEIN: 7 g/dL (ref 6.5–8.1)

## 2018-03-31 LAB — CBC WITH DIFFERENTIAL (CANCER CENTER ONLY)
Abs Immature Granulocytes: 0.01 10*3/uL (ref 0.00–0.07)
Basophils Absolute: 0 10*3/uL (ref 0.0–0.1)
Basophils Relative: 1 %
EOS ABS: 0.1 10*3/uL (ref 0.0–0.5)
Eosinophils Relative: 2 %
HCT: 33 % — ABNORMAL LOW (ref 36.0–46.0)
Hemoglobin: 10.3 g/dL — ABNORMAL LOW (ref 12.0–15.0)
Immature Granulocytes: 0 %
Lymphocytes Relative: 19 %
Lymphs Abs: 0.7 10*3/uL (ref 0.7–4.0)
MCH: 28.7 pg (ref 26.0–34.0)
MCHC: 31.2 g/dL (ref 30.0–36.0)
MCV: 91.9 fL (ref 80.0–100.0)
Monocytes Absolute: 0.5 10*3/uL (ref 0.1–1.0)
Monocytes Relative: 12 %
Neutro Abs: 2.5 10*3/uL (ref 1.7–7.7)
Neutrophils Relative %: 66 %
Platelet Count: 107 10*3/uL — ABNORMAL LOW (ref 150–400)
RBC: 3.59 MIL/uL — AB (ref 3.87–5.11)
RDW: 16.4 % — AB (ref 11.5–15.5)
WBC Count: 3.8 10*3/uL — ABNORMAL LOW (ref 4.0–10.5)
nRBC: 0 % (ref 0.0–0.2)

## 2018-03-31 MED ORDER — ONDANSETRON HCL 8 MG PO TABS
ORAL_TABLET | ORAL | Status: AC
  Filled 2018-03-31: qty 1

## 2018-03-31 MED ORDER — ONDANSETRON HCL 8 MG PO TABS
8.0000 mg | ORAL_TABLET | Freq: Once | ORAL | Status: AC
  Administered 2018-03-31: 8 mg via ORAL

## 2018-03-31 MED ORDER — BORTEZOMIB CHEMO SQ INJECTION 3.5 MG (2.5MG/ML)
1.3000 mg/m2 | Freq: Once | INTRAMUSCULAR | Status: AC
  Administered 2018-03-31: 3 mg via SUBCUTANEOUS
  Filled 2018-03-31: qty 1.2

## 2018-03-31 MED ORDER — ACYCLOVIR 400 MG PO TABS: 400.0000 mg | ORAL_TABLET | Freq: Two times a day (BID) | ORAL | 1 refills | Status: DC

## 2018-03-31 NOTE — Progress Notes (Signed)
.[address] Telephone:([phone]) [phone]   [phone]  OFFICE PROGRESS NOTE  Brenda Dickens, MD [place] [address]  DIAGNOSIS: Plasma cell dyscrasia initially diagnosed as MGUS in September 2010, with additional symptoms suggestive of POEMS syndrome.   PRIOR THERAPY:  1) Velcade 1.3 MG/M2 subcutaneously with Decadron 40 mg by mouth on a weekly basis. First cycle 11/24/2013. She status post 31 weekly doses of treatment. 2) Velcade 1.3 MG/M2 subcutaneously and weekly basis with Decadron 40 mg by mouth weekly. First dose 02/01/2015. Status post 28 cycles. 3) Revlimid 25 mg by mouth daily for 21 days every 4 weeks with weekly Decadron 20 mg. started in 11/27/2015. Status post 3 cycles discontinued secondary to lack of response.  CURRENT THERAPY: Systemic treatment with Velcade 1.3 MG/KG weekly, Revlimid 25 mg by mouth daily for 21 days every 4 weeks in addition to Decadron 20 mg by mouth weekly. First dose 03/06/2016. Status post 25 cycles.   INTERVAL HISTORY: Brenda Conner 58 y.o. female returns to the clinic today for follow-up visit.  The patient is feeling fine today with no concerning complaints except for mild fatigue.  She denied having any chest pain but has shortness of breath with exertion with no cough or hemoptysis.  She denied having any fever or chills.  She has no nausea, vomiting, diarrhea or constipation.  She denied having any headache or visual changes.  She is here today for evaluation before starting the next cycle of her treatment.   MEDICAL HISTORY: Past Medical History:  Diagnosis Date  . Acid reflux   . Anxiety   . Asthma   . Cancer [facility])    waldenstroms/ macroglobinulemia  . Depression   . Depression   . Diabetes mellitus without complication ([place])   . Dysuria 02/28/2016  . Hypercholesteremia   . Hypertension   . Hypothyroidism   . Macroglobulinemia ([place])    ? POEMS syndrome  . Multiple myeloma  ([place])   . Obesity   . PONV (postoperative nausea and vomiting)   . Sleep apnea    CPAP at bedtime  . Sleep apnea     ALLERGIES:  is allergic to codeine; hydrocodone; lortab [hydrocodone-acetaminophen]; onion; shellfish allergy; and amoxicillin.  MEDICATIONS:  Current Outpatient Medications  Medication Sig Dispense Refill  . acyclovir (ZOVIRAX) 400 MG tablet Take 1 tablet (400 mg total) by mouth 2 (two) times daily. 180 tablet 1  . ALPRAZolam (XANAX) 0.5 MG tablet Take 1 tablet (0.5 mg total) by mouth at bedtime as needed for anxiety. 30 tablet 0  . Azelastine HCl 0.15 % SOLN as needed.    . Blood Glucose Monitoring Suppl (ONE TOUCH ULTRA MINI) W/DEVICE KIT See admin instructions. Reported on 01/25/2015  0  . Cetirizine HCl (ZYRTEC ALLERGY PO) Take by mouth daily.    [name] dexamethasone (DECADRON) 4 MG tablet Take 4 mg by mouth once a week. Patient takes 5 tablets 1 day week prior to [facility] appointment.    [name] dexamethasone (DECADRON) 4 MG tablet 5 TABLET BY MOUTH EVERY WEEK. START WITH THE FIRST DOSE OF CHEMOTHERAPY. 30 tablet 5  . DiphenhydrAMINE HCl (BENADRYL ALLERGY PO) Take by mouth as needed.    [name] Fexofenadine HCl (ALLEGRA ALLERGY PO) Take by mouth daily.    . Fluticasone-Salmeterol (ADVAIR) 100-50 MCG/DOSE AEPB Inhale 2 puffs into the lungs every 12 (twelve) hours.    [name] ibuprofen (ADVIL,MOTRIN) 100 MG tablet Take  100 mg by mouth every 6 (six) hours as needed. Reported on 05/31/2015    . lenalidomide (REVLIMID) 25 MG capsule TAKE 1 CAPSULE BY MOUTH ONCE DAILY 21 DAYS ON AND 7 DAYS OFF 21 capsule 0  . levothyroxine (SYNTHROID, LEVOTHROID) 150 MCG tablet Take 150 mcg by mouth daily before breakfast.    . lisinopril (PRINIVIL,ZESTRIL) 10 MG tablet Take 10 mg by mouth daily.  3  . magic mouthwash w/lidocaine SOLN Take 5 mLs by mouth 4 (four) times daily as needed for mouth pain. Swish, Gargle, and spit 240 mL 1  . metFORMIN (GLUCOPHAGE-XR) 500 MG 24 hr tablet Take 500 mg by mouth daily.    [name]  omeprazole (PRILOSEC) 40 MG capsule Take 40 mg by mouth 2 (two) times daily.     . ondansetron (ZOFRAN-ODT) 8 MG disintegrating tablet Take 1 tablet (8 mg total) by mouth every 8 (eight) hours as needed. Reported on 05/31/2015 20 tablet 2  . pravastatin (PRAVACHOL) 40 MG tablet Take 40 mg by mouth every evening.     [name] PROAIR HFA 108 (90 Base) MCG/ACT inhaler Reported on 06/21/2015  1  . sertraline (ZOLOFT) 50 MG tablet TAKE 3 TABLETS BY MOUTH EVERY DAY 270 tablet 3  . verapamil (CALAN-SR) 240 MG CR tablet Take 240 mg by mouth 2 (two) times daily.    [name] warfarin (COUMADIN) 2 MG tablet TAKE 1 TABLET BY MOUTH EVERY DAY 90 tablet 0  . EPIPEN 2-PAK 0.3 MG/0.3ML SOAJ injection Reported on 06/21/2015  1  . loperamide (IMODIUM) 2 MG capsule Take 2 mg by mouth as needed for diarrhea or loose stools.     No current facility-administered medications for this visit.     SURGICAL HISTORY:  Past Surgical History:  Procedure Laterality Date  . ABLATION  09/2007   HTA and polyp resection  . BACK SURGERY  03/2004   herniation, L4-L5  . Bil Laprascopic knee surgery    . BONE MARROW BIOPSY     2011  . BONE MARROW BIOPSY  4/14  . CHOLECYSTECTOMY    . FOOT SURGERY  1998  . KNEE ARTHROSCOPY W/ MENISCAL REPAIR  10/10 ; 3/11  . LAPAROSCOPIC CHOLECYSTECTOMY  1997  . NASAL SINUS SURGERY  2004  . UTERINE FIBROID EMBOLIZATION      REVIEW OF SYSTEMS:  A comprehensive review of systems was negative except for: Constitutional: positive for fatigue Respiratory: positive for dyspnea on exertion   PHYSICAL EXAMINATION: General appearance: alert, cooperative, fatigued and no distress Head: Normocephalic, without obvious abnormality, atraumatic Neck: no adenopathy Lymph nodes: Cervical, supraclavicular, and axillary nodes normal. Resp: clear to auscultation bilaterally Back: symmetric, no curvature. ROM normal. No CVA tenderness. Cardio: regular rate and rhythm, S1, S2 normal, no murmur, click, rub or gallop GI:  soft, non-tender; bowel sounds normal; no masses,  no organomegaly Extremities: extremities normal, atraumatic, no cyanosis or edema  ECOG PERFORMANCE STATUS: 1 - Symptomatic but completely ambulatory  Blood pressure 118/67, pulse 67, temperature 97.6 F (36.4 C), temperature source Oral, resp. rate 18, height 5' 7" (1.702 m), weight 249 lb 4.8 oz (113.1 kg), SpO2 100 %.  LABORATORY DATA: Lab Results  Component Value Date   WBC 3.8 (L) 03/31/2018   HGB 10.3 (L) 03/31/2018   HCT 33.0 (L) 03/31/2018   MCV 91.9 03/31/2018   PLT 107 (L) 03/31/2018      Chemistry      Component Value Date/Time   NA 135 03/31/2018 1104   NA 138  11/14/2016 0824   K 4.0 03/31/2018 1104   K 4.3 11/14/2016 0824   CL 101 03/31/2018 1104   CL 104 04/07/2012 1415   CO2 22 03/31/2018 1104   CO2 23 11/14/2016 0824   BUN 6 03/31/2018 1104   BUN 9.1 11/14/2016 0824   CREATININE 0.80 03/31/2018 1104   CREATININE 0.7 11/14/2016 0824      Component Value Date/Time   CALCIUM 8.9 03/31/2018 1104   CALCIUM 9.6 11/14/2016 0824   ALKPHOS 130 (H) 03/31/2018 1104   ALKPHOS 94 11/14/2016 0824   AST 37 03/31/2018 1104   AST 28 11/14/2016 0824   ALT 38 03/31/2018 1104   ALT 24 11/14/2016 0824   BILITOT 0.8 03/31/2018 1104   BILITOT 0.41 11/14/2016 0824      ASSESSMENT AND PLAN:  This is a very pleasant 58 years old white female with multiple myeloma status post several chemotherapy regimens. The patient has been on treatment with subcutaneous weekly Velcade as well as Revlimid and DecadronStatus post 25 cycles.  She has been tolerating this treatment well with no concerning adverse effects. I recommended for the patient to proceed with cycle #26 today as scheduled. For hypertension she was advised to take her blood pressure medication as prescribed and to reconsult with her primary care physician for adjustment of her medication if needed. I will see her back for follow-up visit in 1 months for evaluation and  repeat blood work. She was advised to call immediately if she has any concerning symptoms in the interval. All questions were answered. The patient knows to call the clinic with any problems, questions or concerns. We can certainly see the patient much sooner if necessary.  Disclaimer: This note was dictated with voice recognition software. Similar sounding words can inadvertently be transcribed and may not be corrected upon review.

## 2018-03-31 NOTE — Telephone Encounter (Signed)
Scheduled per los, declined printout  ° °

## 2018-03-31 NOTE — Patient Instructions (Signed)
Coronavirus (COVID-19) Are you at risk?  Are you at risk for the Coronavirus (COVID-19)?  To be considered HIGH RISK for Coronavirus (COVID-19), you have to meet the following criteria:  . Traveled to [place], [place], [place], [place] or [place]; or in the [place] to [place], [place], [place], or [place]; and have fever, cough, and shortness of breath within the last 2 weeks of travel OR . Been in close contact with a person diagnosed with COVID-19 within the last 2 weeks and have fever, cough, and shortness of breath . IF YOU DO NOT MEET THESE CRITERIA, YOU ARE CONSIDERED LOW RISK FOR COVID-19.  What to do if you are HIGH RISK for COVID-19?  . If you are having a medical emergency, call 911. . Seek medical care right away. Before you go to a doctor's office, urgent care or emergency department, call ahead and tell them about your recent travel, contact with someone diagnosed with COVID-19, and your symptoms. You should receive instructions from your physician's office regarding next steps of care.  . When you arrive at healthcare provider, tell the healthcare staff immediately you have returned from visiting [place], [place], [place], [place] or [place]; or traveled in the [place] to [place], [place], [place], or [place]; in the last two weeks or you have been in close contact with a person diagnosed with COVID-19 in the last 2 weeks.   . Tell the health care staff about your symptoms: fever, cough and shortness of breath. . After you have been seen by a medical provider, you will be either: o Tested for (COVID-19) and discharged home on quarantine except to seek medical care if symptoms worsen, and asked to  - Stay home and avoid contact with others until you get your results (4-5 days)  - Avoid travel on public transportation if possible (such as bus, train, or airplane) or o Sent to the Emergency Department by EMS for evaluation, COVID-19 testing, and possible  admission depending on your condition and test results.  What to do if you are LOW RISK for COVID-19?  Reduce your risk of any infection by using the same precautions used for avoiding the common cold or flu:  . Wash your hands often with soap and warm water for at least 20 seconds.  If soap and water are not readily available, use an alcohol-based hand sanitizer with at least 60% alcohol.  . If coughing or sneezing, cover your mouth and nose by coughing or sneezing into the elbow areas of your shirt or coat, into a tissue or into your sleeve (not your hands). . Avoid shaking hands with others and consider head nods or verbal greetings only. . Avoid touching your eyes, nose, or mouth with unwashed hands.  . Avoid close contact with people who are sick. . Avoid places or events with large numbers of people in one location, like concerts or sporting events. . Carefully consider travel plans you have or are making. . If you are planning any travel outside or inside the [place], visit the CDC's Travelers' Health webpage for the latest health notices. . If you have some symptoms but not all symptoms, continue to monitor at home and seek medical attention if your symptoms worsen. . If you are having a medical emergency, call 911.   ADDITIONAL HEALTHCARE OPTIONS FOR PATIENTS  [place] [facility] / e-Visit: [email or web address]         [facility]: [phone]  [place] [facility]   [facility]: [phone]                   [facility]: [phone]    [facility] Discharge Instructions for Patients Receiving Chemotherapy  Today you received the following chemotherapy agents Velcade  To help prevent nausea and vomiting after your treatment, we encourage you to take your nausea medication as directed   If you develop nausea and vomiting that is not controlled by your nausea medication, call the clinic.   BELOW ARE  SYMPTOMS THAT SHOULD BE REPORTED IMMEDIATELY:  *FEVER GREATER THAN 100.5 F  *CHILLS WITH OR WITHOUT FEVER  NAUSEA AND VOMITING THAT IS NOT CONTROLLED WITH YOUR NAUSEA MEDICATION  *UNUSUAL SHORTNESS OF BREATH  *UNUSUAL BRUISING OR BLEEDING  TENDERNESS IN MOUTH AND THROAT WITH OR WITHOUT PRESENCE OF ULCERS  *URINARY PROBLEMS  *BOWEL PROBLEMS  UNUSUAL RASH Items with * indicate a potential emergency and should be followed up as soon as possible.  Feel free to call the clinic should you have any questions or concerns. The clinic phone number is [phone].  Please show the CHEMO ALERT CARD at check-in to the Emergency Department and triage nurse.   

## 2018-04-01 ENCOUNTER — Other Ambulatory Visit: Payer: Self-pay | Admitting: Internal Medicine

## 2018-04-01 DIAGNOSIS — C9000 Multiple myeloma not having achieved remission: Secondary | ICD-10-CM

## 2018-04-02 MED ORDER — LENALIDOMIDE 25 MG PO CAPS: ORAL_CAPSULE | ORAL | 0 refills | Status: DC

## 2018-04-02 NOTE — Addendum Note (Signed)
Addended by: Ardeen Garland on: 04/02/2018 11:58 AM   Modules accepted: Orders

## 2018-04-07 ENCOUNTER — Encounter: Payer: Self-pay | Admitting: Obstetrics & Gynecology

## 2018-04-08 ENCOUNTER — Inpatient Hospital Stay: Payer: BLUE CROSS/BLUE SHIELD

## 2018-04-08 ENCOUNTER — Other Ambulatory Visit: Payer: Self-pay | Admitting: Obstetrics & Gynecology

## 2018-04-08 ENCOUNTER — Other Ambulatory Visit: Payer: Self-pay

## 2018-04-08 VITALS — BP 110/73 | HR 62 | Temp 98.1°F | Resp 20 | Wt 251.8 lb

## 2018-04-08 DIAGNOSIS — Z7901 Long term (current) use of anticoagulants: Secondary | ICD-10-CM | POA: Diagnosis not present

## 2018-04-08 DIAGNOSIS — Z5112 Encounter for antineoplastic immunotherapy: Secondary | ICD-10-CM | POA: Diagnosis not present

## 2018-04-08 DIAGNOSIS — E119 Type 2 diabetes mellitus without complications: Secondary | ICD-10-CM | POA: Diagnosis not present

## 2018-04-08 DIAGNOSIS — C9000 Multiple myeloma not having achieved remission: Secondary | ICD-10-CM

## 2018-04-08 DIAGNOSIS — Z7984 Long term (current) use of oral hypoglycemic drugs: Secondary | ICD-10-CM | POA: Diagnosis not present

## 2018-04-08 DIAGNOSIS — Z791 Long term (current) use of non-steroidal anti-inflammatories (NSAID): Secondary | ICD-10-CM | POA: Diagnosis not present

## 2018-04-08 DIAGNOSIS — R5383 Other fatigue: Secondary | ICD-10-CM | POA: Diagnosis not present

## 2018-04-08 DIAGNOSIS — I10 Essential (primary) hypertension: Secondary | ICD-10-CM | POA: Diagnosis not present

## 2018-04-08 DIAGNOSIS — F419 Anxiety disorder, unspecified: Secondary | ICD-10-CM | POA: Diagnosis not present

## 2018-04-08 DIAGNOSIS — K219 Gastro-esophageal reflux disease without esophagitis: Secondary | ICD-10-CM | POA: Diagnosis not present

## 2018-04-08 DIAGNOSIS — Z79899 Other long term (current) drug therapy: Secondary | ICD-10-CM | POA: Diagnosis not present

## 2018-04-08 LAB — CBC WITH DIFFERENTIAL (CANCER CENTER ONLY)
Abs Immature Granulocytes: 0.02 10*3/uL (ref 0.00–0.07)
BASOS PCT: 1 %
Basophils Absolute: 0 10*3/uL (ref 0.0–0.1)
Eosinophils Absolute: 0.1 10*3/uL (ref 0.0–0.5)
Eosinophils Relative: 5 %
HCT: 32.3 % — ABNORMAL LOW (ref 36.0–46.0)
Hemoglobin: 10.1 g/dL — ABNORMAL LOW (ref 12.0–15.0)
Immature Granulocytes: 1 %
Lymphocytes Relative: 21 %
Lymphs Abs: 0.5 10*3/uL — ABNORMAL LOW (ref 0.7–4.0)
MCH: 28.9 pg (ref 26.0–34.0)
MCHC: 31.3 g/dL (ref 30.0–36.0)
MCV: 92.6 fL (ref 80.0–100.0)
Monocytes Absolute: 0.2 10*3/uL (ref 0.1–1.0)
Monocytes Relative: 10 %
NEUTROS PCT: 62 %
Neutro Abs: 1.4 10*3/uL — ABNORMAL LOW (ref 1.7–7.7)
PLATELETS: 104 10*3/uL — AB (ref 150–400)
RBC: 3.49 MIL/uL — ABNORMAL LOW (ref 3.87–5.11)
RDW: 17 % — ABNORMAL HIGH (ref 11.5–15.5)
WBC Count: 2.3 10*3/uL — ABNORMAL LOW (ref 4.0–10.5)
nRBC: 0 % (ref 0.0–0.2)

## 2018-04-08 LAB — CMP (CANCER CENTER ONLY)
ALBUMIN: 3.6 g/dL (ref 3.5–5.0)
ALT: 42 U/L (ref 0–44)
AST: 38 U/L (ref 15–41)
Alkaline Phosphatase: 128 U/L — ABNORMAL HIGH (ref 38–126)
Anion gap: 12 (ref 5–15)
BUN: 9 mg/dL (ref 6–20)
CO2: 23 mmol/L (ref 22–32)
Calcium: 9.1 mg/dL (ref 8.9–10.3)
Chloride: 104 mmol/L (ref 98–111)
Creatinine: 0.78 mg/dL (ref 0.44–1.00)
GFR, Est AFR Am: >60 mL/min (ref >60)
GFR, Estimated: >60 mL/min (ref >60)
Glucose, Bld: 171 mg/dL — ABNORMAL HIGH (ref 70–99)
Potassium: 3.8 mmol/L (ref 3.5–5.1)
Sodium: 139 mmol/L (ref 135–145)
Total Bilirubin: 0.8 mg/dL (ref 0.3–1.2)
Total Protein: 6.8 g/dL (ref 6.5–8.1)

## 2018-04-08 MED ORDER — SERTRALINE HCL 50 MG PO TABS: 150.0000 mg | ORAL_TABLET | Freq: Every day | ORAL | 2 refills | Status: DC

## 2018-04-08 MED ORDER — ONDANSETRON HCL 8 MG PO TABS
8.0000 mg | ORAL_TABLET | Freq: Once | ORAL | Status: AC
  Administered 2018-04-08: 8 mg via ORAL

## 2018-04-08 MED ORDER — ONDANSETRON HCL 8 MG PO TABS
ORAL_TABLET | ORAL | Status: AC
  Filled 2018-04-08: qty 1

## 2018-04-08 MED ORDER — BORTEZOMIB CHEMO SQ INJECTION 3.5 MG (2.5MG/ML)
1.3000 mg/m2 | Freq: Once | INTRAMUSCULAR | Status: AC
  Administered 2018-04-08: 3 mg via SUBCUTANEOUS
  Filled 2018-04-08: qty 1.2

## 2018-04-08 NOTE — Progress Notes (Signed)
Per Dr. Julien Nordmann, okay to treat patient with ANC of 1.4

## 2018-04-08 NOTE — Telephone Encounter (Signed)
Medication refill request: sertraline 50 Last AEX:  08/19/17 SM Next AEX: 12/19/18 Last MMG (if hormonal medication request): 11/2017 Refill authorized: 09/24/16 #[id number]. Today #270/2R?

## 2018-04-08 NOTE — Patient Instructions (Signed)
[facility] Discharge Instructions for Patients Receiving Chemotherapy  Today you received the following chemotherapy agents: Bortezomib (VELCADE).  To help prevent nausea and vomiting after your treatment, we encourage you to take your nausea medication as prescribed.   If you develop nausea and vomiting that is not controlled by your nausea medication, call the clinic.   BELOW ARE SYMPTOMS THAT SHOULD BE REPORTED IMMEDIATELY:  *FEVER GREATER THAN 100.5 F  *CHILLS WITH OR WITHOUT FEVER  NAUSEA AND VOMITING THAT IS NOT CONTROLLED WITH YOUR NAUSEA MEDICATION  *UNUSUAL SHORTNESS OF BREATH  *UNUSUAL BRUISING OR BLEEDING  TENDERNESS IN MOUTH AND THROAT WITH OR WITHOUT PRESENCE OF ULCERS  *URINARY PROBLEMS  *BOWEL PROBLEMS  UNUSUAL RASH Items with * indicate a potential emergency and should be followed up as soon as possible.  Feel free to call the clinic should you have any questions or concerns. The clinic phone number is [phone].  Please show the CHEMO ALERT CARD at check-in to the Emergency Department and triage nurse.   Coronavirus (COVID-19) Are you at risk?  Are you at risk for the Coronavirus (COVID-19)?  To be considered HIGH RISK for Coronavirus (COVID-19), you have to meet the following criteria:  . Traveled to [place], [place], [place], [place] or [place]; or in the [place] to [place], [place], [place], or [place]; and have fever, cough, and shortness of breath within the last 2 weeks of travel OR . Been in close contact with a person diagnosed with COVID-19 within the last 2 weeks and have fever, cough, and shortness of breath . IF YOU DO NOT MEET THESE CRITERIA, YOU ARE CONSIDERED LOW RISK FOR COVID-19.  What to do if you are HIGH RISK for COVID-19?  . If you are having a medical emergency, call 911. . Seek medical care right away. Before you go to a doctor's office, urgent care or emergency department, call ahead and tell them  about your recent travel, contact with someone diagnosed with COVID-19, and your symptoms. You should receive instructions from your physician's office regarding next steps of care.  . When you arrive at healthcare provider, tell the healthcare staff immediately you have returned from visiting [place], [place], [place], [place] or [place]; or traveled in the [place] to [place], [place], [place], or [place]; in the last two weeks or you have been in close contact with a person diagnosed with COVID-19 in the last 2 weeks.   . Tell the health care staff about your symptoms: fever, cough and shortness of breath. . After you have been seen by a medical provider, you will be either: o Tested for (COVID-19) and discharged home on quarantine except to seek medical care if symptoms worsen, and asked to  - Stay home and avoid contact with others until you get your results (4-5 days)  - Avoid travel on public transportation if possible (such as bus, train, or airplane) or o Sent to the Emergency Department by EMS for evaluation, COVID-19 testing, and possible admission depending on your condition and test results.  What to do if you are LOW RISK for COVID-19?  Reduce your risk of any infection by using the same precautions used for avoiding the common cold or flu:  . Wash your hands often with soap and warm water for at least 20 seconds.  If soap and water are not readily available, use an alcohol-based hand sanitizer with at least 60% alcohol.  . If coughing   or sneezing, cover your mouth and nose by coughing or sneezing into the elbow areas of your shirt or coat, into a tissue or into your sleeve (not your hands). . Avoid shaking hands with others and consider head nods or verbal greetings only. . Avoid touching your eyes, nose, or mouth with unwashed hands.  . Avoid close contact with people who are sick. . Avoid places or events with large numbers of people in one location, like concerts or  sporting events. . Carefully consider travel plans you have or are making. . If you are planning any travel outside or inside the [place], visit the CDC's Travelers' Health webpage for the latest health notices. . If you have some symptoms but not all symptoms, continue to monitor at home and seek medical attention if your symptoms worsen. . If you are having a medical emergency, call 911.   ADDITIONAL HEALTHCARE OPTIONS FOR PATIENTS  [place] [facility] / e-Visit: [email or web address]         [facility]: [phone]  [facility]: [phone]                   [facility]: [phone]   

## 2018-04-08 NOTE — Telephone Encounter (Signed)
Would you please call in a new prescription for Sertraline HCL 50 mg - Take 3 tablets by mouth every day (90 day supply)?  I have enough pills for 2 weeks. My pharmacy is [facility] on [address].  Thank you very much! Milas Kocher [phone]

## 2018-04-15 ENCOUNTER — Other Ambulatory Visit: Payer: Self-pay

## 2018-04-15 ENCOUNTER — Inpatient Hospital Stay: Payer: BLUE CROSS/BLUE SHIELD | Attending: Internal Medicine

## 2018-04-15 ENCOUNTER — Inpatient Hospital Stay: Payer: BLUE CROSS/BLUE SHIELD

## 2018-04-15 VITALS — BP 121/52 | HR 57 | Temp 98.3°F | Resp 19

## 2018-04-15 DIAGNOSIS — Z880 Allergy status to penicillin: Secondary | ICD-10-CM | POA: Insufficient documentation

## 2018-04-15 DIAGNOSIS — I10 Essential (primary) hypertension: Secondary | ICD-10-CM | POA: Insufficient documentation

## 2018-04-15 DIAGNOSIS — R0609 Other forms of dyspnea: Secondary | ICD-10-CM | POA: Insufficient documentation

## 2018-04-15 DIAGNOSIS — Z5112 Encounter for antineoplastic immunotherapy: Secondary | ICD-10-CM | POA: Insufficient documentation

## 2018-04-15 DIAGNOSIS — Z7901 Long term (current) use of anticoagulants: Secondary | ICD-10-CM | POA: Diagnosis not present

## 2018-04-15 DIAGNOSIS — Z885 Allergy status to narcotic agent status: Secondary | ICD-10-CM | POA: Diagnosis not present

## 2018-04-15 DIAGNOSIS — Z9221 Personal history of antineoplastic chemotherapy: Secondary | ICD-10-CM | POA: Insufficient documentation

## 2018-04-15 DIAGNOSIS — C9000 Multiple myeloma not having achieved remission: Secondary | ICD-10-CM | POA: Insufficient documentation

## 2018-04-15 LAB — CMP (CANCER CENTER ONLY)
ALT: 32 U/L (ref 0–44)
AST: 36 U/L (ref 15–41)
Albumin: 3.5 g/dL (ref 3.5–5.0)
Alkaline Phosphatase: 124 U/L (ref 38–126)
Anion gap: 11 (ref 5–15)
BUN: 8 mg/dL (ref 6–20)
CO2: 22 mmol/L (ref 22–32)
Calcium: 8.9 mg/dL (ref 8.9–10.3)
Chloride: 105 mmol/L (ref 98–111)
Creatinine: 0.8 mg/dL (ref 0.44–1.00)
GFR, Est AFR Am: >60 mL/min (ref >60)
GFR, Estimated: >60 mL/min (ref >60)
Glucose, Bld: 182 mg/dL — ABNORMAL HIGH (ref 70–99)
Potassium: 4.1 mmol/L (ref 3.5–5.1)
Sodium: 138 mmol/L (ref 135–145)
Total Bilirubin: 0.7 mg/dL (ref 0.3–1.2)
Total Protein: 6.7 g/dL (ref 6.5–8.1)

## 2018-04-15 LAB — CBC WITH DIFFERENTIAL (CANCER CENTER ONLY)
Abs Immature Granulocytes: 0.01 10*3/uL (ref 0.00–0.07)
Basophils Absolute: 0.1 10*3/uL (ref 0.0–0.1)
Basophils Relative: 2 %
Eosinophils Absolute: 0 10*3/uL (ref 0.0–0.5)
Eosinophils Relative: 1 %
HCT: 29.3 % — ABNORMAL LOW (ref 36.0–46.0)
Hemoglobin: 9.3 g/dL — ABNORMAL LOW (ref 12.0–15.0)
Immature Granulocytes: 0 %
Lymphocytes Relative: 24 %
Lymphs Abs: 0.7 10*3/uL (ref 0.7–4.0)
MCH: 29.1 pg (ref 26.0–34.0)
MCHC: 31.7 g/dL (ref 30.0–36.0)
MCV: 91.6 fL (ref 80.0–100.0)
Monocytes Absolute: 0.3 10*3/uL (ref 0.1–1.0)
Monocytes Relative: 11 %
Neutro Abs: 1.7 10*3/uL (ref 1.7–7.7)
Neutrophils Relative %: 62 %
Platelet Count: 111 10*3/uL — ABNORMAL LOW (ref 150–400)
RBC: 3.2 MIL/uL — ABNORMAL LOW (ref 3.87–5.11)
RDW: 16.6 % — ABNORMAL HIGH (ref 11.5–15.5)
WBC Count: 2.7 10*3/uL — ABNORMAL LOW (ref 4.0–10.5)
nRBC: 0 % (ref 0.0–0.2)

## 2018-04-15 MED ORDER — ONDANSETRON HCL 8 MG PO TABS
8.0000 mg | ORAL_TABLET | Freq: Once | ORAL | Status: AC
  Administered 2018-04-15: 8 mg via ORAL

## 2018-04-15 MED ORDER — ONDANSETRON HCL 8 MG PO TABS
ORAL_TABLET | ORAL | Status: AC
  Filled 2018-04-15: qty 1

## 2018-04-15 MED ORDER — BORTEZOMIB CHEMO SQ INJECTION 3.5 MG (2.5MG/ML)
1.3000 mg/m2 | Freq: Once | INTRAMUSCULAR | Status: AC
  Administered 2018-04-15: 3 mg via SUBCUTANEOUS
  Filled 2018-04-15: qty 1.2

## 2018-04-22 ENCOUNTER — Inpatient Hospital Stay: Payer: BLUE CROSS/BLUE SHIELD

## 2018-04-22 ENCOUNTER — Other Ambulatory Visit: Payer: Self-pay

## 2018-04-22 VITALS — BP 114/53 | HR 65 | Temp 98.5°F | Resp 18

## 2018-04-22 DIAGNOSIS — C9000 Multiple myeloma not having achieved remission: Secondary | ICD-10-CM

## 2018-04-22 DIAGNOSIS — Z5112 Encounter for antineoplastic immunotherapy: Secondary | ICD-10-CM | POA: Diagnosis not present

## 2018-04-22 DIAGNOSIS — Z9221 Personal history of antineoplastic chemotherapy: Secondary | ICD-10-CM | POA: Diagnosis not present

## 2018-04-22 DIAGNOSIS — R0609 Other forms of dyspnea: Secondary | ICD-10-CM | POA: Diagnosis not present

## 2018-04-22 DIAGNOSIS — I10 Essential (primary) hypertension: Secondary | ICD-10-CM | POA: Diagnosis not present

## 2018-04-22 DIAGNOSIS — Z7901 Long term (current) use of anticoagulants: Secondary | ICD-10-CM | POA: Diagnosis not present

## 2018-04-22 DIAGNOSIS — Z885 Allergy status to narcotic agent status: Secondary | ICD-10-CM | POA: Diagnosis not present

## 2018-04-22 DIAGNOSIS — Z880 Allergy status to penicillin: Secondary | ICD-10-CM | POA: Diagnosis not present

## 2018-04-22 LAB — CMP (CANCER CENTER ONLY)
ALT: 29 U/L (ref 0–44)
AST: 30 U/L (ref 15–41)
Albumin: 3.8 g/dL (ref 3.5–5.0)
Alkaline Phosphatase: 131 U/L — ABNORMAL HIGH (ref 38–126)
Anion gap: 13 (ref 5–15)
BUN: 5 mg/dL — ABNORMAL LOW (ref 6–20)
CO2: 21 mmol/L — ABNORMAL LOW (ref 22–32)
Calcium: 9.1 mg/dL (ref 8.9–10.3)
Chloride: 104 mmol/L (ref 98–111)
Creatinine: 0.79 mg/dL (ref 0.44–1.00)
GFR, Est AFR Am: >60 mL/min (ref >60)
GFR, Estimated: >60 mL/min (ref >60)
Glucose, Bld: 174 mg/dL — ABNORMAL HIGH (ref 70–99)
Potassium: 4.1 mmol/L (ref 3.5–5.1)
Sodium: 138 mmol/L (ref 135–145)
Total Bilirubin: 0.6 mg/dL (ref 0.3–1.2)
Total Protein: 7 g/dL (ref 6.5–8.1)

## 2018-04-22 LAB — CBC WITH DIFFERENTIAL (CANCER CENTER ONLY)
Abs Immature Granulocytes: 0.02 10*3/uL (ref 0.00–0.07)
Basophils Absolute: 0 10*3/uL (ref 0.0–0.1)
Basophils Relative: 1 %
Eosinophils Absolute: 0.1 10*3/uL (ref 0.0–0.5)
Eosinophils Relative: 3 %
HCT: 33.1 % — ABNORMAL LOW (ref 36.0–46.0)
Hemoglobin: 10.4 g/dL — ABNORMAL LOW (ref 12.0–15.0)
Immature Granulocytes: 1 %
Lymphocytes Relative: 25 %
Lymphs Abs: 0.7 10*3/uL (ref 0.7–4.0)
MCH: 28.5 pg (ref 26.0–34.0)
MCHC: 31.4 g/dL (ref 30.0–36.0)
MCV: 90.7 fL (ref 80.0–100.0)
Monocytes Absolute: 0.2 10*3/uL (ref 0.1–1.0)
Monocytes Relative: 8 %
Neutro Abs: 1.7 10*3/uL (ref 1.7–7.7)
Neutrophils Relative %: 62 %
Platelet Count: 109 10*3/uL — ABNORMAL LOW (ref 150–400)
RBC: 3.65 MIL/uL — ABNORMAL LOW (ref 3.87–5.11)
RDW: 16.4 % — ABNORMAL HIGH (ref 11.5–15.5)
WBC Count: 2.7 10*3/uL — ABNORMAL LOW (ref 4.0–10.5)
nRBC: 0 % (ref 0.0–0.2)

## 2018-04-22 MED ORDER — BORTEZOMIB CHEMO SQ INJECTION 3.5 MG (2.5MG/ML)
1.3000 mg/m2 | Freq: Once | INTRAMUSCULAR | Status: AC
  Administered 2018-04-22: 3 mg via SUBCUTANEOUS
  Filled 2018-04-22: qty 1.2

## 2018-04-22 MED ORDER — ONDANSETRON HCL 8 MG PO TABS
ORAL_TABLET | ORAL | Status: AC
  Filled 2018-04-22: qty 1

## 2018-04-22 MED ORDER — ONDANSETRON HCL 8 MG PO TABS
8.0000 mg | ORAL_TABLET | Freq: Once | ORAL | Status: AC
  Administered 2018-04-22: 8 mg via ORAL

## 2018-04-29 ENCOUNTER — Inpatient Hospital Stay: Payer: BLUE CROSS/BLUE SHIELD | Admitting: Internal Medicine

## 2018-04-29 ENCOUNTER — Inpatient Hospital Stay: Payer: BLUE CROSS/BLUE SHIELD

## 2018-04-29 ENCOUNTER — Encounter: Payer: Self-pay | Admitting: Internal Medicine

## 2018-04-29 ENCOUNTER — Other Ambulatory Visit: Payer: Self-pay

## 2018-04-29 ENCOUNTER — Other Ambulatory Visit: Payer: Self-pay | Admitting: Internal Medicine

## 2018-04-29 VITALS — BP 135/53 | HR 63 | Temp 97.6°F | Resp 18 | Ht 67.0 in | Wt 247.1 lb

## 2018-04-29 DIAGNOSIS — Z880 Allergy status to penicillin: Secondary | ICD-10-CM | POA: Diagnosis not present

## 2018-04-29 DIAGNOSIS — Z9221 Personal history of antineoplastic chemotherapy: Secondary | ICD-10-CM | POA: Diagnosis not present

## 2018-04-29 DIAGNOSIS — C9000 Multiple myeloma not having achieved remission: Secondary | ICD-10-CM

## 2018-04-29 DIAGNOSIS — Z885 Allergy status to narcotic agent status: Secondary | ICD-10-CM

## 2018-04-29 DIAGNOSIS — Z7901 Long term (current) use of anticoagulants: Secondary | ICD-10-CM

## 2018-04-29 DIAGNOSIS — R0609 Other forms of dyspnea: Secondary | ICD-10-CM | POA: Diagnosis not present

## 2018-04-29 DIAGNOSIS — Z5111 Encounter for antineoplastic chemotherapy: Secondary | ICD-10-CM

## 2018-04-29 DIAGNOSIS — Z5112 Encounter for antineoplastic immunotherapy: Secondary | ICD-10-CM | POA: Diagnosis not present

## 2018-04-29 DIAGNOSIS — I10 Essential (primary) hypertension: Secondary | ICD-10-CM

## 2018-04-29 LAB — CBC WITH DIFFERENTIAL (CANCER CENTER ONLY)
Abs Immature Granulocytes: 0.01 10*3/uL (ref 0.00–0.07)
Basophils Absolute: 0 10*3/uL (ref 0.0–0.1)
Basophils Relative: 1 %
Eosinophils Absolute: 0.1 10*3/uL (ref 0.0–0.5)
Eosinophils Relative: 4 %
HCT: 32.1 % — ABNORMAL LOW (ref 36.0–46.0)
Hemoglobin: 9.9 g/dL — ABNORMAL LOW (ref 12.0–15.0)
Immature Granulocytes: 0 %
Lymphocytes Relative: 19 %
Lymphs Abs: 0.5 10*3/uL — ABNORMAL LOW (ref 0.7–4.0)
MCH: 28.3 pg (ref 26.0–34.0)
MCHC: 30.8 g/dL (ref 30.0–36.0)
MCV: 91.7 fL (ref 80.0–100.0)
Monocytes Absolute: 0.3 10*3/uL (ref 0.1–1.0)
Monocytes Relative: 13 %
Neutro Abs: 1.7 10*3/uL (ref 1.7–7.7)
Neutrophils Relative %: 63 %
Platelet Count: 95 10*3/uL — ABNORMAL LOW (ref 150–400)
RBC: 3.5 MIL/uL — ABNORMAL LOW (ref 3.87–5.11)
RDW: 16.2 % — ABNORMAL HIGH (ref 11.5–15.5)
WBC Count: 2.7 10*3/uL — ABNORMAL LOW (ref 4.0–10.5)
nRBC: 0 % (ref 0.0–0.2)

## 2018-04-29 LAB — CMP (CANCER CENTER ONLY)
ALT: 33 U/L (ref 0–44)
AST: 36 U/L (ref 15–41)
Albumin: 3.6 g/dL (ref 3.5–5.0)
Alkaline Phosphatase: 121 U/L (ref 38–126)
Anion gap: 14 (ref 5–15)
BUN: 5 mg/dL — ABNORMAL LOW (ref 6–20)
CO2: 21 mmol/L — ABNORMAL LOW (ref 22–32)
Calcium: 8.8 mg/dL — ABNORMAL LOW (ref 8.9–10.3)
Chloride: 105 mmol/L (ref 98–111)
Creatinine: 0.74 mg/dL (ref 0.44–1.00)
GFR, Est AFR Am: >60 mL/min (ref >60)
GFR, Estimated: >60 mL/min (ref >60)
Glucose, Bld: 168 mg/dL — ABNORMAL HIGH (ref 70–99)
Potassium: 3.6 mmol/L (ref 3.5–5.1)
Sodium: 140 mmol/L (ref 135–145)
Total Bilirubin: 0.5 mg/dL (ref 0.3–1.2)
Total Protein: 6.7 g/dL (ref 6.5–8.1)

## 2018-04-29 MED ORDER — LENALIDOMIDE 25 MG PO CAPS: ORAL_CAPSULE | ORAL | 0 refills | Status: DC

## 2018-04-29 MED ORDER — WARFARIN SODIUM 2 MG PO TABS: 2.0000 mg | ORAL_TABLET | Freq: Every day | ORAL | 0 refills | Status: DC

## 2018-04-29 MED ORDER — ONDANSETRON HCL 8 MG PO TABS
ORAL_TABLET | ORAL | Status: AC
  Filled 2018-04-29: qty 1

## 2018-04-29 MED ORDER — BORTEZOMIB CHEMO SQ INJECTION 3.5 MG (2.5MG/ML)
1.3000 mg/m2 | Freq: Once | INTRAMUSCULAR | Status: AC
  Administered 2018-04-29: 3 mg via SUBCUTANEOUS
  Filled 2018-04-29: qty 1.2

## 2018-04-29 MED ORDER — ONDANSETRON HCL 8 MG PO TABS
8.0000 mg | ORAL_TABLET | Freq: Once | ORAL | Status: AC
  Administered 2018-04-29: 8 mg via ORAL

## 2018-04-29 MED ORDER — DEXAMETHASONE 4 MG PO TABS: 4.0000 mg | ORAL_TABLET | ORAL | 1 refills | Status: DC

## 2018-04-29 NOTE — Patient Instructions (Signed)
Coronavirus (COVID-19) Are you at risk?  Are you at risk for the Coronavirus (COVID-19)?  To be considered HIGH RISK for Coronavirus (COVID-19), you have to meet the following criteria:  . Traveled to [place], [place], [place], [place] or [place]; or in the [place] to [place], [place], [place], or [place]; and have fever, cough, and shortness of breath within the last 2 weeks of travel OR . Been in close contact with a person diagnosed with COVID-19 within the last 2 weeks and have fever, cough, and shortness of breath . IF YOU DO NOT MEET THESE CRITERIA, YOU ARE CONSIDERED LOW RISK FOR COVID-19.  What to do if you are HIGH RISK for COVID-19?  . If you are having a medical emergency, call 911. . Seek medical care right away. Before you go to a doctor's office, urgent care or emergency department, call ahead and tell them about your recent travel, contact with someone diagnosed with COVID-19, and your symptoms. You should receive instructions from your physician's office regarding next steps of care.  . When you arrive at healthcare provider, tell the healthcare staff immediately you have returned from visiting [place], [place], [place], [place] or [place]; or traveled in the [place] to [place], [place], [place], or [place]; in the last two weeks or you have been in close contact with a person diagnosed with COVID-19 in the last 2 weeks.   . Tell the health care staff about your symptoms: fever, cough and shortness of breath. . After you have been seen by a medical provider, you will be either: o Tested for (COVID-19) and discharged home on quarantine except to seek medical care if symptoms worsen, and asked to  - Stay home and avoid contact with others until you get your results (4-5 days)  - Avoid travel on public transportation if possible (such as bus, train, or airplane) or o Sent to the Emergency Department by EMS for evaluation, COVID-19 testing, and possible  admission depending on your condition and test results.  What to do if you are LOW RISK for COVID-19?  Reduce your risk of any infection by using the same precautions used for avoiding the common cold or flu:  . Wash your hands often with soap and warm water for at least 20 seconds.  If soap and water are not readily available, use an alcohol-based hand sanitizer with at least 60% alcohol.  . If coughing or sneezing, cover your mouth and nose by coughing or sneezing into the elbow areas of your shirt or coat, into a tissue or into your sleeve (not your hands). . Avoid shaking hands with others and consider head nods or verbal greetings only. . Avoid touching your eyes, nose, or mouth with unwashed hands.  . Avoid close contact with people who are sick. . Avoid places or events with large numbers of people in one location, like concerts or sporting events. . Carefully consider travel plans you have or are making. . If you are planning any travel outside or inside the [place], visit the CDC's Travelers' Health webpage for the latest health notices. . If you have some symptoms but not all symptoms, continue to monitor at home and seek medical attention if your symptoms worsen. . If you are having a medical emergency, call 911.   ADDITIONAL HEALTHCARE OPTIONS FOR PATIENTS  [facility] / e-Visit: [email or web address]         [facility]: [phone]  [place]   [facility]: [phone]                   [facility]: [phone]    [facility] Discharge Instructions for Patients Receiving Chemotherapy  Today you received the following chemotherapy agents Velcade  To help prevent nausea and vomiting after your treatment, we encourage you to take your nausea medication as directed   If you develop nausea and vomiting that is not controlled by your nausea medication, call the clinic.   BELOW ARE  SYMPTOMS THAT SHOULD BE REPORTED IMMEDIATELY:  *FEVER GREATER THAN 100.5 F  *CHILLS WITH OR WITHOUT FEVER  NAUSEA AND VOMITING THAT IS NOT CONTROLLED WITH YOUR NAUSEA MEDICATION  *UNUSUAL SHORTNESS OF BREATH  *UNUSUAL BRUISING OR BLEEDING  TENDERNESS IN MOUTH AND THROAT WITH OR WITHOUT PRESENCE OF ULCERS  *URINARY PROBLEMS  *BOWEL PROBLEMS  UNUSUAL RASH Items with * indicate a potential emergency and should be followed up as soon as possible.  Feel free to call the clinic should you have any questions or concerns. The clinic phone number is [phone].  Please show the CHEMO ALERT CARD at check-in to the Emergency Department and triage nurse.   

## 2018-04-29 NOTE — Addendum Note (Signed)
Addended by: Ardeen Garland on: 04/29/2018 04:22 PM   Modules accepted: Orders

## 2018-04-29 NOTE — Progress Notes (Signed)
.[place] [phone]   [phone]  OFFICE PROGRESS NOTE  Brenda Dickens, MD [place] [address]  DIAGNOSIS: Plasma cell dyscrasia initially diagnosed as MGUS in September 2010, with additional symptoms suggestive of POEMS syndrome.   PRIOR THERAPY:  1) Velcade 1.3 MG/M2 subcutaneously with Decadron 40 mg by mouth on a weekly basis. First cycle 11/24/2013. She status post 31 weekly doses of treatment. 2) Velcade 1.3 MG/M2 subcutaneously and weekly basis with Decadron 40 mg by mouth weekly. First dose 02/01/2015. Status post 28 cycles. 3) Revlimid 25 mg by mouth daily for 21 days every 4 weeks with weekly Decadron 20 mg. started in 11/27/2015. Status post 3 cycles discontinued secondary to lack of response.  CURRENT THERAPY: Systemic treatment with Velcade 1.3 MG/KG weekly, Revlimid 25 mg by mouth daily for 21 days every 4 weeks in addition to Decadron 20 mg by mouth weekly. First dose 03/06/2016. Status post 26 cycles.   INTERVAL HISTORY: Brenda Conner 58 y.o. female returns to the clinic today for follow-up visit.  The patient is feeling fine today with no concerning complaints.  She works mainly from home now.  She denied having any current chest pain, shortness of breath, cough or hemoptysis.  She continues to have mild fatigue.  She denied having any nausea, vomiting or diarrhea or constipation.  She has no fever or chills.  She continues to tolerate her treatment with chemotherapy fairly well.  She is here today for evaluation before starting cycle #27 of her treatment.  MEDICAL HISTORY: Past Medical History:  Diagnosis Date  . Acid reflux   . Anxiety   . Asthma   . Cancer [facility])    waldenstroms/ macroglobinulemia  . Depression   . Depression   . Diabetes mellitus without complication ([place])   . Dysuria 02/28/2016  . Hypercholesteremia   . Hypertension   . Hypothyroidism   . Macroglobulinemia ([place])    ? POEMS syndrome  . Multiple myeloma ([place])   . Obesity   . PONV (postoperative nausea and vomiting)   . Sleep apnea    CPAP at bedtime  . Sleep apnea     ALLERGIES:  is allergic to codeine; hydrocodone; lortab [hydrocodone-acetaminophen]; onion; shellfish allergy; and amoxicillin.  MEDICATIONS:  Current Outpatient Medications  Medication Sig Dispense Refill  . acyclovir (ZOVIRAX) 400 MG tablet Take 1 tablet (400 mg total) by mouth 2 (two) times daily. 180 tablet 1  . ALPRAZolam (XANAX) 0.5 MG tablet Take 1 tablet (0.5 mg total) by mouth at bedtime as needed for anxiety. 30 tablet 0  . Azelastine HCl 0.15 % SOLN as needed.    . Blood Glucose Monitoring Suppl (ONE TOUCH ULTRA MINI) W/DEVICE KIT See admin instructions. Reported on 01/25/2015  0  . Cetirizine HCl (ZYRTEC ALLERGY PO) Take by mouth daily.    [name] dexamethasone (DECADRON) 4 MG tablet Take 4 mg by mouth once a week. Patient takes 5 tablets 1 day week prior to [facility] appointment.    [name] dexamethasone (DECADRON) 4 MG tablet 5 TABLET BY MOUTH EVERY WEEK. START WITH THE FIRST DOSE OF CHEMOTHERAPY. 30 tablet 5  . DiphenhydrAMINE HCl (BENADRYL ALLERGY PO) Take by mouth as needed.    [name] EPIPEN 2-PAK 0.3 MG/0.3ML SOAJ injection Reported on 06/21/2015  1  . Fexofenadine HCl (ALLEGRA ALLERGY PO) Take by mouth daily.    . Fluticasone-Salmeterol (ADVAIR) 100-50 MCG/DOSE AEPB Inhale 2  puffs into the lungs every 12 (twelve) hours.    [name] ibuprofen (ADVIL,MOTRIN) 100 MG tablet Take 100 mg by mouth every 6 (six) hours as needed. Reported on 05/31/2015    . lenalidomide (REVLIMID) 25 MG capsule TAKE 1 CAPSULE BY MOUTH ONCE DAILY 21 DAYS ON AND 7 DAYS OFF 21 capsule 0  . levothyroxine (SYNTHROID, LEVOTHROID) 150 MCG tablet Take 150 mcg by mouth daily before breakfast.    . lisinopril (PRINIVIL,ZESTRIL) 10 MG tablet Take 10 mg by mouth daily.  3  . loperamide (IMODIUM) 2 MG capsule Take 2 mg by mouth as needed for diarrhea or loose stools.    . magic  mouthwash w/lidocaine SOLN Take 5 mLs by mouth 4 (four) times daily as needed for mouth pain. Swish, Gargle, and spit 240 mL 1  . metFORMIN (GLUCOPHAGE-XR) 500 MG 24 hr tablet Take 500 mg by mouth daily.    [name] omeprazole (PRILOSEC) 40 MG capsule Take 40 mg by mouth 2 (two) times daily.     . ondansetron (ZOFRAN-ODT) 8 MG disintegrating tablet Take 1 tablet (8 mg total) by mouth every 8 (eight) hours as needed. Reported on 05/31/2015 20 tablet 2  . pravastatin (PRAVACHOL) 40 MG tablet Take 40 mg by mouth every evening.     [name] PROAIR HFA 108 (90 Base) MCG/ACT inhaler Reported on 06/21/2015  1  . sertraline (ZOLOFT) 50 MG tablet Take 3 tablets (150 mg total) by mouth daily. 270 tablet 2  . verapamil (CALAN-SR) 240 MG CR tablet Take 240 mg by mouth 2 (two) times daily.    [name] warfarin (COUMADIN) 2 MG tablet TAKE 1 TABLET BY MOUTH EVERY DAY 90 tablet 0   No current facility-administered medications for this visit.     SURGICAL HISTORY:  Past Surgical History:  Procedure Laterality Date  . ABLATION  09/2007   HTA and polyp resection  . BACK SURGERY  03/2004   herniation, L4-L5  . Bil Laprascopic knee surgery    . BONE MARROW BIOPSY     2011  . BONE MARROW BIOPSY  4/14  . CHOLECYSTECTOMY    . FOOT SURGERY  1998  . KNEE ARTHROSCOPY W/ MENISCAL REPAIR  10/10 ; 3/11  . LAPAROSCOPIC CHOLECYSTECTOMY  1997  . NASAL SINUS SURGERY  2004  . UTERINE FIBROID EMBOLIZATION      REVIEW OF SYSTEMS:  A comprehensive review of systems was negative except for: Constitutional: positive for fatigue Respiratory: positive for dyspnea on exertion   PHYSICAL EXAMINATION: General appearance: alert, cooperative, fatigued and no distress Head: Normocephalic, without obvious abnormality, atraumatic Neck: no adenopathy Lymph nodes: Cervical, supraclavicular, and axillary nodes normal. Resp: clear to auscultation bilaterally Back: symmetric, no curvature. ROM normal. No CVA tenderness. Cardio: regular rate and rhythm,  S1, S2 normal, no murmur, click, rub or gallop GI: soft, non-tender; bowel sounds normal; no masses,  no organomegaly Extremities: extremities normal, atraumatic, no cyanosis or edema  ECOG PERFORMANCE STATUS: 1 - Symptomatic but completely ambulatory  Blood pressure (!) 135/53, pulse 63, temperature 97.6 F (36.4 C), temperature source Oral, resp. rate 18, height _0  (1.702 m), weight 247 lb 1.6 oz (112.1 kg), SpO2 100 %.  LABORATORY DATA: Lab Results  Component Value Date   WBC 2.7 (L) 04/29/2018   HGB 9.9 (L) 04/29/2018   HCT 32.1 (L) 04/29/2018   MCV 91.7 04/29/2018   PLT 95 (L) 04/29/2018      Chemistry      Component Value Date/Time  NA 138 04/22/2018 0852   NA 138 11/14/2016 0824   K 4.1 04/22/2018 0852   K 4.3 11/14/2016 0824   CL 104 04/22/2018 0852   CL 104 04/07/2012 1415   CO2 21 (L) 04/22/2018 0852   CO2 23 11/14/2016 0824   BUN 5 (L) 04/22/2018 0852   BUN 9.1 11/14/2016 0824   CREATININE 0.79 04/22/2018 0852   CREATININE 0.7 11/14/2016 0824      Component Value Date/Time   CALCIUM 9.1 04/22/2018 0852   CALCIUM 9.6 11/14/2016 0824   ALKPHOS 131 (H) 04/22/2018 0852   ALKPHOS 94 11/14/2016 0824   AST 30 04/22/2018 0852   AST 28 11/14/2016 0824   ALT 29 04/22/2018 0852   ALT 24 11/14/2016 0824   BILITOT 0.6 04/22/2018 0852   BILITOT 0.41 11/14/2016 0824      ASSESSMENT AND PLAN:  This is a very pleasant 58 years old white female with multiple myeloma status post several chemotherapy regimens. The patient has been on treatment with subcutaneous weekly Velcade as well as Revlimid and DecadronStatus post 26 cycles.  The patient continues to tolerate this treatment well with no concerning adverse effects except for fatigue. I recommended for her to proceed with cycle #27 today as scheduled. I will see her back for follow-up visit in 4 weeks for evaluation after repeating myeloma panel for assessment of her disease. For hypertension she was advised to  take her blood pressure medication as prescribed and to reconsult with her primary care physician for adjustment of her medication if needed. She was advised to call immediately if she has any concerning symptoms in the interval. All questions were answered. The patient knows to call the clinic with any problems, questions or concerns. We can certainly see the patient much sooner if necessary.  Disclaimer: This note was dictated with voice recognition software. Similar sounding words can inadvertently be transcribed and may not be corrected upon review.

## 2018-04-29 NOTE — Progress Notes (Signed)
Per MD Julien Nordmann, ok to treat with CBC and all labs today.

## 2018-04-30 DIAGNOSIS — G4733 Obstructive sleep apnea (adult) (pediatric): Secondary | ICD-10-CM | POA: Diagnosis not present

## 2018-05-06 ENCOUNTER — Inpatient Hospital Stay (HOSPITAL_BASED_OUTPATIENT_CLINIC_OR_DEPARTMENT_OTHER): Payer: BLUE CROSS/BLUE SHIELD | Admitting: Internal Medicine

## 2018-05-06 ENCOUNTER — Other Ambulatory Visit: Payer: Self-pay

## 2018-05-06 ENCOUNTER — Inpatient Hospital Stay: Payer: BLUE CROSS/BLUE SHIELD

## 2018-05-06 VITALS — BP 130/64 | HR 62 | Temp 98.1°F | Resp 18 | Ht 67.0 in | Wt 247.2 lb

## 2018-05-06 DIAGNOSIS — Z885 Allergy status to narcotic agent status: Secondary | ICD-10-CM | POA: Diagnosis not present

## 2018-05-06 DIAGNOSIS — Z880 Allergy status to penicillin: Secondary | ICD-10-CM | POA: Diagnosis not present

## 2018-05-06 DIAGNOSIS — R0609 Other forms of dyspnea: Secondary | ICD-10-CM | POA: Diagnosis not present

## 2018-05-06 DIAGNOSIS — C9000 Multiple myeloma not having achieved remission: Secondary | ICD-10-CM | POA: Diagnosis not present

## 2018-05-06 DIAGNOSIS — Z7901 Long term (current) use of anticoagulants: Secondary | ICD-10-CM | POA: Diagnosis not present

## 2018-05-06 DIAGNOSIS — Z9221 Personal history of antineoplastic chemotherapy: Secondary | ICD-10-CM | POA: Diagnosis not present

## 2018-05-06 DIAGNOSIS — Z5112 Encounter for antineoplastic immunotherapy: Secondary | ICD-10-CM | POA: Diagnosis not present

## 2018-05-06 DIAGNOSIS — I10 Essential (primary) hypertension: Secondary | ICD-10-CM | POA: Diagnosis not present

## 2018-05-06 LAB — CMP (CANCER CENTER ONLY)
ALT: 36 U/L (ref 0–44)
AST: 36 U/L (ref 15–41)
Albumin: 3.7 g/dL (ref 3.5–5.0)
Alkaline Phosphatase: 140 U/L — ABNORMAL HIGH (ref 38–126)
Anion gap: 12 (ref 5–15)
BUN: 10 mg/dL (ref 6–20)
CO2: 24 mmol/L (ref 22–32)
Calcium: 8.9 mg/dL (ref 8.9–10.3)
Chloride: 103 mmol/L (ref 98–111)
Creatinine: 0.82 mg/dL (ref 0.44–1.00)
GFR, Est AFR Am: >60 mL/min (ref >60)
GFR, Estimated: >60 mL/min (ref >60)
Glucose, Bld: 212 mg/dL — ABNORMAL HIGH (ref 70–99)
Potassium: 3.8 mmol/L (ref 3.5–5.1)
Sodium: 139 mmol/L (ref 135–145)
Total Bilirubin: 0.6 mg/dL (ref 0.3–1.2)
Total Protein: 6.9 g/dL (ref 6.5–8.1)

## 2018-05-06 LAB — CBC WITH DIFFERENTIAL (CANCER CENTER ONLY)
Abs Immature Granulocytes: 0.01 10*3/uL (ref 0.00–0.07)
Basophils Absolute: 0 10*3/uL (ref 0.0–0.1)
Basophils Relative: 1 %
Eosinophils Absolute: 0.1 10*3/uL (ref 0.0–0.5)
Eosinophils Relative: 5 %
HCT: 32.9 % — ABNORMAL LOW (ref 36.0–46.0)
Hemoglobin: 10.3 g/dL — ABNORMAL LOW (ref 12.0–15.0)
Immature Granulocytes: 0 %
Lymphocytes Relative: 26 %
Lymphs Abs: 0.7 10*3/uL (ref 0.7–4.0)
MCH: 28.5 pg (ref 26.0–34.0)
MCHC: 31.3 g/dL (ref 30.0–36.0)
MCV: 90.9 fL (ref 80.0–100.0)
Monocytes Absolute: 0.3 10*3/uL (ref 0.1–1.0)
Monocytes Relative: 12 %
Neutro Abs: 1.5 10*3/uL — ABNORMAL LOW (ref 1.7–7.7)
Neutrophils Relative %: 56 %
Platelet Count: 99 10*3/uL — ABNORMAL LOW (ref 150–400)
RBC: 3.62 MIL/uL — ABNORMAL LOW (ref 3.87–5.11)
RDW: 16.2 % — ABNORMAL HIGH (ref 11.5–15.5)
WBC Count: 2.6 10*3/uL — ABNORMAL LOW (ref 4.0–10.5)
nRBC: 0 % (ref 0.0–0.2)

## 2018-05-06 MED ORDER — ONDANSETRON HCL 8 MG PO TABS
8.0000 mg | ORAL_TABLET | Freq: Once | ORAL | Status: AC
  Administered 2018-05-06: 8 mg via ORAL

## 2018-05-06 MED ORDER — ONDANSETRON HCL 8 MG PO TABS
ORAL_TABLET | ORAL | Status: AC
  Filled 2018-05-06: qty 1

## 2018-05-06 MED ORDER — BORTEZOMIB CHEMO SQ INJECTION 3.5 MG (2.5MG/ML)
1.3000 mg/m2 | Freq: Once | INTRAMUSCULAR | Status: AC
  Administered 2018-05-06: 10:00:00 3 mg via SUBCUTANEOUS
  Filled 2018-05-06: qty 1.2

## 2018-05-06 NOTE — Patient Instructions (Signed)
Coronavirus (COVID-19) Are you at risk?  Are you at risk for the Coronavirus (COVID-19)?  To be considered HIGH RISK for Coronavirus (COVID-19), you have to meet the following criteria:  . Traveled to [place], [place], [place], [place] or [place]; or in the [place] to [place], [place], [place], or [place]; and have fever, cough, and shortness of breath within the last 2 weeks of travel OR . Been in close contact with a person diagnosed with COVID-19 within the last 2 weeks and have fever, cough, and shortness of breath . IF YOU DO NOT MEET THESE CRITERIA, YOU ARE CONSIDERED LOW RISK FOR COVID-19.  What to do if you are HIGH RISK for COVID-19?  . If you are having a medical emergency, call 911. . Seek medical care right away. Before you go to a doctor's office, urgent care or emergency department, call ahead and tell them about your recent travel, contact with someone diagnosed with COVID-19, and your symptoms. You should receive instructions from your physician's office regarding next steps of care.  . When you arrive at healthcare provider, tell the healthcare staff immediately you have returned from visiting [place], [place], [place], [place] or [place]; or traveled in the [place] to [place], [place], [place], or [place]; in the last two weeks or you have been in close contact with a person diagnosed with COVID-19 in the last 2 weeks.   . Tell the health care staff about your symptoms: fever, cough and shortness of breath. . After you have been seen by a medical provider, you will be either: o Tested for (COVID-19) and discharged home on quarantine except to seek medical care if symptoms worsen, and asked to  - Stay home and avoid contact with others until you get your results (4-5 days)  - Avoid travel on public transportation if possible (such as bus, train, or airplane) or o Sent to the Emergency Department by EMS for evaluation, COVID-19 testing, and possible  admission depending on your condition and test results.  What to do if you are LOW RISK for COVID-19?  Reduce your risk of any infection by using the same precautions used for avoiding the common cold or flu:  . Wash your hands often with soap and warm water for at least 20 seconds.  If soap and water are not readily available, use an alcohol-based hand sanitizer with at least 60% alcohol.  . If coughing or sneezing, cover your mouth and nose by coughing or sneezing into the elbow areas of your shirt or coat, into a tissue or into your sleeve (not your hands). . Avoid shaking hands with others and consider head nods or verbal greetings only. . Avoid touching your eyes, nose, or mouth with unwashed hands.  . Avoid close contact with people who are sick. . Avoid places or events with large numbers of people in one location, like concerts or sporting events. . Carefully consider travel plans you have or are making. . If you are planning any travel outside or inside the [place], visit the CDC's Travelers' Health webpage for the latest health notices. . If you have some symptoms but not all symptoms, continue to monitor at home and seek medical attention if your symptoms worsen. . If you are having a medical emergency, call 911.   ADDITIONAL HEALTHCARE OPTIONS FOR PATIENTS  [facility] [place] [facility] / e-Visit: [email or web address]         [facility]: [phone]  [place]   [facility]: [phone]                   [facility]: [phone]    [facility] Discharge Instructions for Patients Receiving Chemotherapy  Today you received the following chemotherapy agents Velcade  To help prevent nausea and vomiting after your treatment, we encourage you to take your nausea medication as directed   If you develop nausea and vomiting that is not controlled by your nausea medication, call the clinic.   BELOW ARE  SYMPTOMS THAT SHOULD BE REPORTED IMMEDIATELY:  *FEVER GREATER THAN 100.5 F  *CHILLS WITH OR WITHOUT FEVER  NAUSEA AND VOMITING THAT IS NOT CONTROLLED WITH YOUR NAUSEA MEDICATION  *UNUSUAL SHORTNESS OF BREATH  *UNUSUAL BRUISING OR BLEEDING  TENDERNESS IN MOUTH AND THROAT WITH OR WITHOUT PRESENCE OF ULCERS  *URINARY PROBLEMS  *BOWEL PROBLEMS  UNUSUAL RASH Items with * indicate a potential emergency and should be followed up as soon as possible.  Feel free to call the clinic should you have any questions or concerns. The clinic phone number is [phone].  Please show the CHEMO ALERT CARD at check-in to the Emergency Department and triage nurse.   

## 2018-05-06 NOTE — Progress Notes (Signed)
Per Dr. Mckinley Jewel pt is OK to treat with platelets of 99.

## 2018-05-06 NOTE — Progress Notes (Signed)
.[place] Telephone:([phone]   [phone]  OFFICE PROGRESS NOTE  [name], MD [place] [address]  DIAGNOSIS: Plasma cell dyscrasia initially diagnosed as MGUS in September 2010, with additional symptoms suggestive of POEMS syndrome.   PRIOR THERAPY:  1) Velcade 1.3 MG/M2 subcutaneously with Decadron 40 mg by mouth on a weekly basis. First cycle 11/24/2013. She status post 31 weekly doses of treatment. 2) Velcade 1.3 MG/M2 subcutaneously and weekly basis with Decadron 40 mg by mouth weekly. First dose 02/01/2015. Status post 28 cycles. 3) Revlimid 25 mg by mouth daily for 21 days every 4 weeks with weekly Decadron 20 mg. started in 11/27/2015. Status post 3 cycles discontinued secondary to lack of response.  CURRENT THERAPY: Systemic treatment with Velcade 1.3 MG/KG weekly, Revlimid 25 mg by mouth daily for 21 days every 4 weeks in addition to Decadron 20 mg by mouth weekly. First dose 03/06/2016. Status post 26 cycles.   INTERVAL HISTORY: Brenda Conner 58 y.o. female returns to the clinic today for follow-up visit.  The patient is feeling fine today with no concerning complaints.  She works mainly from home now.  She denied having any current chest pain, shortness of breath, cough or hemoptysis.  She continues to have mild fatigue.  She denied having any nausea, vomiting or diarrhea or constipation.  She has no fever or chills.  She continues to tolerate her treatment with chemotherapy fairly well.  She is here today for evaluation before starting cycle #27 of her treatment.  MEDICAL HISTORY: Past Medical History:  Diagnosis Date  . Acid reflux   . Anxiety   . Asthma   . Cancer [facility])    waldenstroms/ macroglobinulemia  . Depression   . Depression   . Diabetes mellitus without complication ([place])   . Dysuria 02/28/2016  . Hypercholesteremia   . Hypertension   . Hypothyroidism   . Macroglobulinemia ([name])    ? POEMS syndrome  . Multiple myeloma ([place])   . Obesity   . PONV (postoperative nausea and vomiting)   . Sleep apnea    CPAP at bedtime  . Sleep apnea     ALLERGIES:  is allergic to codeine; hydrocodone; lortab [hydrocodone-acetaminophen]; onion; shellfish allergy; and amoxicillin.  MEDICATIONS:  Current Outpatient Medications  Medication Sig Dispense Refill  . acyclovir (ZOVIRAX) 400 MG tablet Take 1 tablet (400 mg total) by mouth 2 (two) times daily. 180 tablet 1  . ALPRAZolam (XANAX) 0.5 MG tablet Take 1 tablet (0.5 mg total) by mouth at bedtime as needed for anxiety. 30 tablet 0  . Azelastine HCl 0.15 % SOLN as needed.    . Blood Glucose Monitoring Suppl (ONE TOUCH ULTRA MINI) W/DEVICE KIT See admin instructions. Reported on 01/25/2015  0  . Cetirizine HCl (ZYRTEC ALLERGY PO) Take by mouth daily.    [name] dexamethasone (DECADRON) 4 MG tablet Take 1 tablet (4 mg total) by mouth once a week. Patient takes 5 tablets 1 day week prior to [facility] appointment. 40 tablet 1  . DiphenhydrAMINE HCl (BENADRYL ALLERGY PO) Take by mouth as needed.    [name] EPIPEN 2-PAK 0.3 MG/0.3ML SOAJ injection Reported on 06/21/2015  1  . Fexofenadine HCl (ALLEGRA ALLERGY PO) Take by mouth daily.    . Fluticasone-Salmeterol (ADVAIR) 100-50 MCG/DOSE AEPB Inhale 2 puffs into the lungs every 12 (twelve) hours.    [name] ibuprofen (ADVIL,MOTRIN) 100 MG tablet Take 100  mg by mouth every 6 (six) hours as needed. Reported on 05/31/2015    . lenalidomide (REVLIMID) 25 MG capsule TAKE 1 CAPSULE BY MOUTH ONCE DAILY 21 DAYS ON AND 7 DAYS OFF. Adult female not of childbearing potential. 04/29/2018 -auth number [id number] 21 capsule 0  . levothyroxine (SYNTHROID, LEVOTHROID) 150 MCG tablet Take 150 mcg by mouth daily before breakfast.    . lisinopril (PRINIVIL,ZESTRIL) 10 MG tablet Take 10 mg by mouth daily.  3  . loperamide (IMODIUM) 2 MG capsule Take 2 mg by mouth as needed for diarrhea or loose stools.    . magic mouthwash w/lidocaine SOLN  Take 5 mLs by mouth 4 (four) times daily as needed for mouth pain. Swish, Gargle, and spit (Patient not taking: Reported on 04/29/2018) 240 mL 1  . metFORMIN (GLUCOPHAGE-XR) 500 MG 24 hr tablet Take 500 mg by mouth daily.    [name] omeprazole (PRILOSEC) 40 MG capsule Take 40 mg by mouth 2 (two) times daily.     . ondansetron (ZOFRAN-ODT) 8 MG disintegrating tablet Take 1 tablet (8 mg total) by mouth every 8 (eight) hours as needed. Reported on 05/31/2015 20 tablet 2  . pravastatin (PRAVACHOL) 40 MG tablet Take 40 mg by mouth every evening.     [name] PROAIR HFA 108 (90 Base) MCG/ACT inhaler Reported on 06/21/2015  1  . sertraline (ZOLOFT) 50 MG tablet Take 3 tablets (150 mg total) by mouth daily. 270 tablet 2  . verapamil (CALAN-SR) 240 MG CR tablet Take 240 mg by mouth 2 (two) times daily.    [name] warfarin (COUMADIN) 2 MG tablet Take 1 tablet (2 mg total) by mouth daily. 90 tablet 0   No current facility-administered medications for this visit.     SURGICAL HISTORY:  Past Surgical History:  Procedure Laterality Date  . ABLATION  09/2007   HTA and polyp resection  . BACK SURGERY  03/2004   herniation, L4-L5  . Bil Laprascopic knee surgery    . BONE MARROW BIOPSY     2011  . BONE MARROW BIOPSY  4/14  . CHOLECYSTECTOMY    . FOOT SURGERY  1998  . KNEE ARTHROSCOPY W/ MENISCAL REPAIR  10/10 ; 3/11  . LAPAROSCOPIC CHOLECYSTECTOMY  1997  . NASAL SINUS SURGERY  2004  . UTERINE FIBROID EMBOLIZATION      REVIEW OF SYSTEMS:  A comprehensive review of systems was negative except for: Constitutional: positive for fatigue Respiratory: positive for dyspnea on exertion   PHYSICAL EXAMINATION: General appearance: alert, cooperative, fatigued and no distress Head: Normocephalic, without obvious abnormality, atraumatic Neck: no adenopathy Lymph nodes: Cervical, supraclavicular, and axillary nodes normal. Resp: clear to auscultation bilaterally Back: symmetric, no curvature. ROM normal. No CVA tenderness. Cardio:  regular rate and rhythm, S1, S2 normal, no murmur, click, rub or gallop GI: soft, non-tender; bowel sounds normal; no masses,  no organomegaly Extremities: extremities normal, atraumatic, no cyanosis or edema  ECOG PERFORMANCE STATUS: 1 - Symptomatic but completely ambulatory  There were no vitals taken for this visit.  LABORATORY DATA: Lab Results  Component Value Date   WBC 2.6 (L) 05/06/2018   HGB 10.3 (L) 05/06/2018   HCT 32.9 (L) 05/06/2018   MCV 90.9 05/06/2018   PLT 99 (L) 05/06/2018      Chemistry      Component Value Date/Time   NA 140 04/29/2018 0844   NA 138 11/14/2016 0824   K 3.6 04/29/2018 0844   K 4.3 11/14/2016 0824   CL  105 04/29/2018 0844   CL 104 04/07/2012 1415   CO2 21 (L) 04/29/2018 0844   CO2 23 11/14/2016 0824   BUN 5 (L) 04/29/2018 0844   BUN 9.1 11/14/2016 0824   CREATININE 0.74 04/29/2018 0844   CREATININE 0.7 11/14/2016 0824      Component Value Date/Time   CALCIUM 8.8 (L) 04/29/2018 0844   CALCIUM 9.6 11/14/2016 0824   ALKPHOS 121 04/29/2018 0844   ALKPHOS 94 11/14/2016 0824   AST 36 04/29/2018 0844   AST 28 11/14/2016 0824   ALT 33 04/29/2018 0844   ALT 24 11/14/2016 0824   BILITOT 0.5 04/29/2018 0844   BILITOT 0.41 11/14/2016 0824      ASSESSMENT AND PLAN:  This is a very pleasant 58 years old white female with multiple myeloma status post several chemotherapy regimens. The patient has been on treatment with subcutaneous weekly Velcade as well as Revlimid and DecadronStatus post 26 cycles.  The patient continues to tolerate this treatment well with no concerning adverse effects except for fatigue. I recommended for her to proceed with cycle #27 today as scheduled. I will see her back for follow-up visit in 4 weeks for evaluation after repeating myeloma panel for assessment of her disease. For hypertension she was advised to take her blood pressure medication as prescribed and to reconsult with her primary care physician for  adjustment of her medication if needed. She was advised to call immediately if she has any concerning symptoms in the interval. All questions were answered. The patient knows to call the clinic with any problems, questions or concerns. We can certainly see the patient much sooner if necessary.  Disclaimer: This note was dictated with voice recognition software. Similar sounding words can inadvertently be transcribed and may not be corrected upon review.

## 2018-05-13 ENCOUNTER — Inpatient Hospital Stay: Payer: BLUE CROSS/BLUE SHIELD

## 2018-05-13 ENCOUNTER — Inpatient Hospital Stay: Payer: BLUE CROSS/BLUE SHIELD | Attending: Internal Medicine

## 2018-05-13 ENCOUNTER — Other Ambulatory Visit: Payer: Self-pay

## 2018-05-13 VITALS — BP 116/68 | HR 62 | Temp 98.2°F

## 2018-05-13 DIAGNOSIS — R5383 Other fatigue: Secondary | ICD-10-CM | POA: Insufficient documentation

## 2018-05-13 DIAGNOSIS — C9000 Multiple myeloma not having achieved remission: Secondary | ICD-10-CM

## 2018-05-13 DIAGNOSIS — Z7951 Long term (current) use of inhaled steroids: Secondary | ICD-10-CM | POA: Insufficient documentation

## 2018-05-13 DIAGNOSIS — Z5112 Encounter for antineoplastic immunotherapy: Secondary | ICD-10-CM | POA: Diagnosis not present

## 2018-05-13 DIAGNOSIS — Z7901 Long term (current) use of anticoagulants: Secondary | ICD-10-CM | POA: Insufficient documentation

## 2018-05-13 DIAGNOSIS — Z79899 Other long term (current) drug therapy: Secondary | ICD-10-CM | POA: Insufficient documentation

## 2018-05-13 DIAGNOSIS — Z791 Long term (current) use of non-steroidal anti-inflammatories (NSAID): Secondary | ICD-10-CM | POA: Insufficient documentation

## 2018-05-13 DIAGNOSIS — F419 Anxiety disorder, unspecified: Secondary | ICD-10-CM | POA: Diagnosis not present

## 2018-05-13 DIAGNOSIS — Z7984 Long term (current) use of oral hypoglycemic drugs: Secondary | ICD-10-CM | POA: Diagnosis not present

## 2018-05-13 DIAGNOSIS — E119 Type 2 diabetes mellitus without complications: Secondary | ICD-10-CM | POA: Insufficient documentation

## 2018-05-13 DIAGNOSIS — I10 Essential (primary) hypertension: Secondary | ICD-10-CM | POA: Insufficient documentation

## 2018-05-13 DIAGNOSIS — K219 Gastro-esophageal reflux disease without esophagitis: Secondary | ICD-10-CM | POA: Diagnosis not present

## 2018-05-13 LAB — CMP (CANCER CENTER ONLY)
ALT: 30 U/L (ref 0–44)
AST: 38 U/L (ref 15–41)
Albumin: 3.7 g/dL (ref 3.5–5.0)
Alkaline Phosphatase: 123 U/L (ref 38–126)
Anion gap: 12 (ref 5–15)
BUN: 9 mg/dL (ref 6–20)
CO2: 21 mmol/L — ABNORMAL LOW (ref 22–32)
Calcium: 9 mg/dL (ref 8.9–10.3)
Chloride: 106 mmol/L (ref 98–111)
Creatinine: 0.8 mg/dL (ref 0.44–1.00)
GFR, Est AFR Am: >60 mL/min (ref >60)
GFR, Estimated: >60 mL/min (ref >60)
Glucose, Bld: 188 mg/dL — ABNORMAL HIGH (ref 70–99)
Potassium: 4.2 mmol/L (ref 3.5–5.1)
Sodium: 139 mmol/L (ref 135–145)
Total Bilirubin: 0.5 mg/dL (ref 0.3–1.2)
Total Protein: 6.9 g/dL (ref 6.5–8.1)

## 2018-05-13 LAB — CBC WITH DIFFERENTIAL (CANCER CENTER ONLY)
Abs Immature Granulocytes: 0 10*3/uL (ref 0.00–0.07)
Basophils Absolute: 0.1 10*3/uL (ref 0.0–0.1)
Basophils Relative: 2 %
Eosinophils Absolute: 0.1 10*3/uL (ref 0.0–0.5)
Eosinophils Relative: 2 %
HCT: 32.4 % — ABNORMAL LOW (ref 36.0–46.0)
Hemoglobin: 10.1 g/dL — ABNORMAL LOW (ref 12.0–15.0)
Immature Granulocytes: 0 %
Lymphocytes Relative: 27 %
Lymphs Abs: 0.7 10*3/uL (ref 0.7–4.0)
MCH: 28.5 pg (ref 26.0–34.0)
MCHC: 31.2 g/dL (ref 30.0–36.0)
MCV: 91.5 fL (ref 80.0–100.0)
Monocytes Absolute: 0.3 10*3/uL (ref 0.1–1.0)
Monocytes Relative: 11 %
Neutro Abs: 1.5 10*3/uL — ABNORMAL LOW (ref 1.7–7.7)
Neutrophils Relative %: 58 %
Platelet Count: 123 10*3/uL — ABNORMAL LOW (ref 150–400)
RBC: 3.54 MIL/uL — ABNORMAL LOW (ref 3.87–5.11)
RDW: 16.2 % — ABNORMAL HIGH (ref 11.5–15.5)
WBC Count: 2.6 10*3/uL — ABNORMAL LOW (ref 4.0–10.5)
nRBC: 0 % (ref 0.0–0.2)

## 2018-05-13 MED ORDER — ONDANSETRON HCL 8 MG PO TABS
8.0000 mg | ORAL_TABLET | Freq: Once | ORAL | Status: AC
  Administered 2018-05-13: 8 mg via ORAL

## 2018-05-13 MED ORDER — BORTEZOMIB CHEMO SQ INJECTION 3.5 MG (2.5MG/ML)
1.3000 mg/m2 | Freq: Once | INTRAMUSCULAR | Status: AC
  Administered 2018-05-13: 3 mg via SUBCUTANEOUS
  Filled 2018-05-13: qty 1.2

## 2018-05-13 MED ORDER — ONDANSETRON HCL 8 MG PO TABS
ORAL_TABLET | ORAL | Status: AC
  Filled 2018-05-13: qty 1

## 2018-05-13 NOTE — Patient Instructions (Signed)
Coronavirus (COVID-19) Are you at risk?  Are you at risk for the Coronavirus (COVID-19)?  To be considered HIGH RISK for Coronavirus (COVID-19), you have to meet the following criteria:  . Traveled to [place], [place], [place], [place] or [place]; or in the [place] to [place], [place], [place], or [place]; and have fever, cough, and shortness of breath within the last 2 weeks of travel OR . Been in close contact with a person diagnosed with COVID-19 within the last 2 weeks and have fever, cough, and shortness of breath . IF YOU DO NOT MEET THESE CRITERIA, YOU ARE CONSIDERED LOW RISK FOR COVID-19.  What to do if you are HIGH RISK for COVID-19?  . If you are having a medical emergency, call 911. . Seek medical care right away. Before you go to a doctor's office, urgent care or emergency department, call ahead and tell them about your recent travel, contact with someone diagnosed with COVID-19, and your symptoms. You should receive instructions from your physician's office regarding next steps of care.  . When you arrive at healthcare provider, tell the healthcare staff immediately you have returned from visiting [place], [place], [place], [place] or [place]; or traveled in the [place] to [place], [place], [place], or [place]; in the last two weeks or you have been in close contact with a person diagnosed with COVID-19 in the last 2 weeks.   . Tell the health care staff about your symptoms: fever, cough and shortness of breath. . After you have been seen by a medical provider, you will be either: o Tested for (COVID-19) and discharged home on quarantine except to seek medical care if symptoms worsen, and asked to  - Stay home and avoid contact with others until you get your results (4-5 days)  - Avoid travel on public transportation if possible (such as bus, train, or airplane) or o Sent to the Emergency Department by EMS for evaluation, COVID-19 testing, and possible  admission depending on your condition and test results.  What to do if you are LOW RISK for COVID-19?  Reduce your risk of any infection by using the same precautions used for avoiding the common cold or flu:  . Wash your hands often with soap and warm water for at least 20 seconds.  If soap and water are not readily available, use an alcohol-based hand sanitizer with at least 60% alcohol.  . If coughing or sneezing, cover your mouth and nose by coughing or sneezing into the elbow areas of your shirt or coat, into a tissue or into your sleeve (not your hands). . Avoid shaking hands with others and consider head nods or verbal greetings only. . Avoid touching your eyes, nose, or mouth with unwashed hands.  . Avoid close contact with people who are sick. . Avoid places or events with large numbers of people in one location, like concerts or sporting events. . Carefully consider travel plans you have or are making. . If you are planning any travel outside or inside the [place], visit the CDC's Travelers' Health webpage for the latest health notices. . If you have some symptoms but not all symptoms, continue to monitor at home and seek medical attention if your symptoms worsen. . If you are having a medical emergency, call 911.   ADDITIONAL HEALTHCARE OPTIONS FOR PATIENTS  [place] [facility] / e-Visit: [email or web address]         [facility]: [phone]  [place]   [facility]: [phone]                   [facility]: [phone]    [facility] Discharge Instructions for Patients Receiving Chemotherapy  Today you received the following chemotherapy agents Velcade  To help prevent nausea and vomiting after your treatment, we encourage you to take your nausea medication as directed   If you develop nausea and vomiting that is not controlled by your nausea medication, call the clinic.   BELOW ARE  SYMPTOMS THAT SHOULD BE REPORTED IMMEDIATELY:  *FEVER GREATER THAN 100.5 F  *CHILLS WITH OR WITHOUT FEVER  NAUSEA AND VOMITING THAT IS NOT CONTROLLED WITH YOUR NAUSEA MEDICATION  *UNUSUAL SHORTNESS OF BREATH  *UNUSUAL BRUISING OR BLEEDING  TENDERNESS IN MOUTH AND THROAT WITH OR WITHOUT PRESENCE OF ULCERS  *URINARY PROBLEMS  *BOWEL PROBLEMS  UNUSUAL RASH Items with * indicate a potential emergency and should be followed up as soon as possible.  Feel free to call the clinic should you have any questions or concerns. The clinic phone number is [phone].  Please show the CHEMO ALERT CARD at check-in to the Emergency Department and triage nurse.   

## 2018-05-20 ENCOUNTER — Inpatient Hospital Stay: Payer: BLUE CROSS/BLUE SHIELD

## 2018-05-20 ENCOUNTER — Other Ambulatory Visit: Payer: Self-pay

## 2018-05-20 VITALS — BP 128/48 | HR 61 | Temp 97.9°F | Resp 20 | Wt 248.0 lb

## 2018-05-20 DIAGNOSIS — C9000 Multiple myeloma not having achieved remission: Secondary | ICD-10-CM | POA: Diagnosis not present

## 2018-05-20 DIAGNOSIS — Z7901 Long term (current) use of anticoagulants: Secondary | ICD-10-CM | POA: Diagnosis not present

## 2018-05-20 DIAGNOSIS — F419 Anxiety disorder, unspecified: Secondary | ICD-10-CM | POA: Diagnosis not present

## 2018-05-20 DIAGNOSIS — K219 Gastro-esophageal reflux disease without esophagitis: Secondary | ICD-10-CM | POA: Diagnosis not present

## 2018-05-20 DIAGNOSIS — R5383 Other fatigue: Secondary | ICD-10-CM | POA: Diagnosis not present

## 2018-05-20 DIAGNOSIS — Z5112 Encounter for antineoplastic immunotherapy: Secondary | ICD-10-CM | POA: Diagnosis not present

## 2018-05-20 DIAGNOSIS — E119 Type 2 diabetes mellitus without complications: Secondary | ICD-10-CM | POA: Diagnosis not present

## 2018-05-20 DIAGNOSIS — Z79899 Other long term (current) drug therapy: Secondary | ICD-10-CM | POA: Diagnosis not present

## 2018-05-20 DIAGNOSIS — I10 Essential (primary) hypertension: Secondary | ICD-10-CM | POA: Diagnosis not present

## 2018-05-20 DIAGNOSIS — Z7951 Long term (current) use of inhaled steroids: Secondary | ICD-10-CM | POA: Diagnosis not present

## 2018-05-20 DIAGNOSIS — Z791 Long term (current) use of non-steroidal anti-inflammatories (NSAID): Secondary | ICD-10-CM | POA: Diagnosis not present

## 2018-05-20 DIAGNOSIS — Z7984 Long term (current) use of oral hypoglycemic drugs: Secondary | ICD-10-CM | POA: Diagnosis not present

## 2018-05-20 LAB — CMP (CANCER CENTER ONLY)
ALT: 34 U/L (ref 0–44)
AST: 31 U/L (ref 15–41)
Albumin: 3.8 g/dL (ref 3.5–5.0)
Alkaline Phosphatase: 135 U/L — ABNORMAL HIGH (ref 38–126)
Anion gap: 12 (ref 5–15)
BUN: 6 mg/dL (ref 6–20)
CO2: 22 mmol/L (ref 22–32)
Calcium: 9 mg/dL (ref 8.9–10.3)
Chloride: 105 mmol/L (ref 98–111)
Creatinine: 0.79 mg/dL (ref 0.44–1.00)
GFR, Est AFR Am: >60 mL/min (ref >60)
GFR, Estimated: >60 mL/min (ref >60)
Glucose, Bld: 176 mg/dL — ABNORMAL HIGH (ref 70–99)
Potassium: 3.8 mmol/L (ref 3.5–5.1)
Sodium: 139 mmol/L (ref 135–145)
Total Bilirubin: 0.6 mg/dL (ref 0.3–1.2)
Total Protein: 6.9 g/dL (ref 6.5–8.1)

## 2018-05-20 LAB — CBC WITH DIFFERENTIAL (CANCER CENTER ONLY)
Abs Immature Granulocytes: 0.01 10*3/uL (ref 0.00–0.07)
Basophils Absolute: 0 10*3/uL (ref 0.0–0.1)
Basophils Relative: 1 %
Eosinophils Absolute: 0.1 10*3/uL (ref 0.0–0.5)
Eosinophils Relative: 3 %
HCT: 31.7 % — ABNORMAL LOW (ref 36.0–46.0)
Hemoglobin: 9.9 g/dL — ABNORMAL LOW (ref 12.0–15.0)
Immature Granulocytes: 0 %
Lymphocytes Relative: 25 %
Lymphs Abs: 0.6 10*3/uL — ABNORMAL LOW (ref 0.7–4.0)
MCH: 28.4 pg (ref 26.0–34.0)
MCHC: 31.2 g/dL (ref 30.0–36.0)
MCV: 90.8 fL (ref 80.0–100.0)
Monocytes Absolute: 0.1 10*3/uL (ref 0.1–1.0)
Monocytes Relative: 6 %
Neutro Abs: 1.5 10*3/uL — ABNORMAL LOW (ref 1.7–7.7)
Neutrophils Relative %: 65 %
Platelet Count: 101 10*3/uL — ABNORMAL LOW (ref 150–400)
RBC: 3.49 MIL/uL — ABNORMAL LOW (ref 3.87–5.11)
RDW: 15.9 % — ABNORMAL HIGH (ref 11.5–15.5)
WBC Count: 2.3 10*3/uL — ABNORMAL LOW (ref 4.0–10.5)
nRBC: 0 % (ref 0.0–0.2)

## 2018-05-20 LAB — LACTATE DEHYDROGENASE: LDH: 128 U/L (ref 98–192)

## 2018-05-20 MED ORDER — ONDANSETRON HCL 8 MG PO TABS
ORAL_TABLET | ORAL | Status: AC
  Filled 2018-05-20: qty 1

## 2018-05-20 MED ORDER — BORTEZOMIB CHEMO SQ INJECTION 3.5 MG (2.5MG/ML)
1.3000 mg/m2 | Freq: Once | INTRAMUSCULAR | Status: AC
  Administered 2018-05-20: 3 mg via SUBCUTANEOUS
  Filled 2018-05-20: qty 1.2

## 2018-05-20 MED ORDER — ONDANSETRON HCL 8 MG PO TABS
8.0000 mg | ORAL_TABLET | Freq: Once | ORAL | Status: AC
  Administered 2018-05-20: 8 mg via ORAL

## 2018-05-20 NOTE — Patient Instructions (Signed)
[facility] Discharge Instructions for Patients Receiving Chemotherapy  Today you received the following chemotherapy agents Velcade.  To help prevent nausea and vomiting after your treatment, we encourage you to take your nausea medication as directed.  If you develop nausea and vomiting that is not controlled by your nausea medication, call the clinic.   BELOW ARE SYMPTOMS THAT SHOULD BE REPORTED IMMEDIATELY:  *FEVER GREATER THAN 100.5 F  *CHILLS WITH OR WITHOUT FEVER  NAUSEA AND VOMITING THAT IS NOT CONTROLLED WITH YOUR NAUSEA MEDICATION  *UNUSUAL SHORTNESS OF BREATH  *UNUSUAL BRUISING OR BLEEDING  TENDERNESS IN MOUTH AND THROAT WITH OR WITHOUT PRESENCE OF ULCERS  *URINARY PROBLEMS  *BOWEL PROBLEMS  UNUSUAL RASH Items with * indicate a potential emergency and should be followed up as soon as possible.  Feel free to call the clinic should you have any questions or concerns. The clinic phone number is [phone].  Please show the CHEMO ALERT CARD at check-in to the Emergency Department and triage nurse.   

## 2018-05-21 LAB — BETA 2 MICROGLOBULIN, SERUM: Beta-2 Microglobulin: 1.7 mg/L (ref 0.6–2.4)

## 2018-05-21 LAB — IGG, IGA, IGM
IgA: 121 mg/dL (ref 87–352)
IgG (Immunoglobin G), Serum: 426 mg/dL — ABNORMAL LOW (ref 586–1602)
IgM (Immunoglobulin M), Srm: 563 mg/dL — ABNORMAL HIGH (ref 26–217)

## 2018-05-21 LAB — KAPPA/LAMBDA LIGHT CHAINS
Kappa free light chain: 11.6 mg/L (ref 3.3–19.4)
Kappa, lambda light chain ratio: 0.03 — ABNORMAL LOW (ref 0.26–1.65)
Lambda free light chains: 463.6 mg/L — ABNORMAL HIGH (ref 5.7–26.3)

## 2018-05-26 ENCOUNTER — Other Ambulatory Visit: Payer: Self-pay | Admitting: Medical Oncology

## 2018-05-26 DIAGNOSIS — C9000 Multiple myeloma not having achieved remission: Secondary | ICD-10-CM

## 2018-05-27 ENCOUNTER — Inpatient Hospital Stay: Payer: BLUE CROSS/BLUE SHIELD

## 2018-05-27 ENCOUNTER — Other Ambulatory Visit: Payer: Self-pay | Admitting: Internal Medicine

## 2018-05-27 ENCOUNTER — Other Ambulatory Visit: Payer: Self-pay

## 2018-05-27 VITALS — BP 131/67 | HR 59 | Temp 97.4°F | Resp 18

## 2018-05-27 DIAGNOSIS — F419 Anxiety disorder, unspecified: Secondary | ICD-10-CM | POA: Diagnosis not present

## 2018-05-27 DIAGNOSIS — Z79899 Other long term (current) drug therapy: Secondary | ICD-10-CM | POA: Diagnosis not present

## 2018-05-27 DIAGNOSIS — C9000 Multiple myeloma not having achieved remission: Secondary | ICD-10-CM

## 2018-05-27 DIAGNOSIS — Z7901 Long term (current) use of anticoagulants: Secondary | ICD-10-CM | POA: Diagnosis not present

## 2018-05-27 DIAGNOSIS — Z7951 Long term (current) use of inhaled steroids: Secondary | ICD-10-CM | POA: Diagnosis not present

## 2018-05-27 DIAGNOSIS — R5383 Other fatigue: Secondary | ICD-10-CM | POA: Diagnosis not present

## 2018-05-27 DIAGNOSIS — Z5112 Encounter for antineoplastic immunotherapy: Secondary | ICD-10-CM | POA: Diagnosis not present

## 2018-05-27 DIAGNOSIS — K219 Gastro-esophageal reflux disease without esophagitis: Secondary | ICD-10-CM | POA: Diagnosis not present

## 2018-05-27 DIAGNOSIS — I10 Essential (primary) hypertension: Secondary | ICD-10-CM | POA: Diagnosis not present

## 2018-05-27 DIAGNOSIS — Z791 Long term (current) use of non-steroidal anti-inflammatories (NSAID): Secondary | ICD-10-CM | POA: Diagnosis not present

## 2018-05-27 DIAGNOSIS — E119 Type 2 diabetes mellitus without complications: Secondary | ICD-10-CM | POA: Diagnosis not present

## 2018-05-27 DIAGNOSIS — Z7984 Long term (current) use of oral hypoglycemic drugs: Secondary | ICD-10-CM | POA: Diagnosis not present

## 2018-05-27 LAB — CBC WITH DIFFERENTIAL (CANCER CENTER ONLY)
Abs Immature Granulocytes: 0.01 10*3/uL (ref 0.00–0.07)
Basophils Absolute: 0 10*3/uL (ref 0.0–0.1)
Basophils Relative: 1 %
Eosinophils Absolute: 0.1 10*3/uL (ref 0.0–0.5)
Eosinophils Relative: 3 %
HCT: 31.8 % — ABNORMAL LOW (ref 36.0–46.0)
Hemoglobin: 10.1 g/dL — ABNORMAL LOW (ref 12.0–15.0)
Immature Granulocytes: 0 %
Lymphocytes Relative: 20 %
Lymphs Abs: 0.7 10*3/uL (ref 0.7–4.0)
MCH: 28.5 pg (ref 26.0–34.0)
MCHC: 31.8 g/dL (ref 30.0–36.0)
MCV: 89.8 fL (ref 80.0–100.0)
Monocytes Absolute: 0.4 10*3/uL (ref 0.1–1.0)
Monocytes Relative: 12 %
Neutro Abs: 2.1 10*3/uL (ref 1.7–7.7)
Neutrophils Relative %: 64 %
Platelet Count: 115 10*3/uL — ABNORMAL LOW (ref 150–400)
RBC: 3.54 MIL/uL — ABNORMAL LOW (ref 3.87–5.11)
RDW: 15.9 % — ABNORMAL HIGH (ref 11.5–15.5)
WBC Count: 3.3 10*3/uL — ABNORMAL LOW (ref 4.0–10.5)
nRBC: 0 % (ref 0.0–0.2)

## 2018-05-27 LAB — CMP (CANCER CENTER ONLY)
ALT: 35 U/L (ref 0–44)
AST: 42 U/L — ABNORMAL HIGH (ref 15–41)
Albumin: 3.7 g/dL (ref 3.5–5.0)
Alkaline Phosphatase: 133 U/L — ABNORMAL HIGH (ref 38–126)
Anion gap: 12 (ref 5–15)
BUN: 10 mg/dL (ref 6–20)
CO2: 22 mmol/L (ref 22–32)
Calcium: 9 mg/dL (ref 8.9–10.3)
Chloride: 102 mmol/L (ref 98–111)
Creatinine: 0.79 mg/dL (ref 0.44–1.00)
GFR, Est AFR Am: >60 mL/min (ref >60)
GFR, Estimated: >60 mL/min (ref >60)
Glucose, Bld: 191 mg/dL — ABNORMAL HIGH (ref 70–99)
Potassium: 3.7 mmol/L (ref 3.5–5.1)
Sodium: 136 mmol/L (ref 135–145)
Total Bilirubin: 0.5 mg/dL (ref 0.3–1.2)
Total Protein: 6.9 g/dL (ref 6.5–8.1)

## 2018-05-27 MED ORDER — ONDANSETRON HCL 8 MG PO TABS
8.0000 mg | ORAL_TABLET | Freq: Once | ORAL | Status: AC
  Administered 2018-05-27: 8 mg via ORAL

## 2018-05-27 MED ORDER — BORTEZOMIB CHEMO SQ INJECTION 3.5 MG (2.5MG/ML)
1.3000 mg/m2 | Freq: Once | INTRAMUSCULAR | Status: AC
  Administered 2018-05-27: 3 mg via SUBCUTANEOUS
  Filled 2018-05-27: qty 1.2

## 2018-05-27 MED ORDER — ONDANSETRON HCL 8 MG PO TABS
ORAL_TABLET | ORAL | Status: AC
  Filled 2018-05-27: qty 1

## 2018-05-29 ENCOUNTER — Other Ambulatory Visit: Payer: Self-pay | Admitting: Medical Oncology

## 2018-05-30 ENCOUNTER — Other Ambulatory Visit: Payer: Self-pay | Admitting: Medical Oncology

## 2018-05-30 DIAGNOSIS — C9000 Multiple myeloma not having achieved remission: Secondary | ICD-10-CM

## 2018-05-30 DIAGNOSIS — G4733 Obstructive sleep apnea (adult) (pediatric): Secondary | ICD-10-CM | POA: Diagnosis not present

## 2018-06-03 ENCOUNTER — Encounter: Payer: Self-pay | Admitting: Internal Medicine

## 2018-06-03 ENCOUNTER — Other Ambulatory Visit: Payer: Self-pay

## 2018-06-03 ENCOUNTER — Inpatient Hospital Stay: Payer: BLUE CROSS/BLUE SHIELD

## 2018-06-03 ENCOUNTER — Inpatient Hospital Stay: Payer: BLUE CROSS/BLUE SHIELD | Admitting: Internal Medicine

## 2018-06-03 VITALS — BP 129/63 | HR 70 | Temp 97.8°F | Resp 18 | Ht 67.0 in | Wt 245.6 lb

## 2018-06-03 DIAGNOSIS — Z791 Long term (current) use of non-steroidal anti-inflammatories (NSAID): Secondary | ICD-10-CM

## 2018-06-03 DIAGNOSIS — Z7901 Long term (current) use of anticoagulants: Secondary | ICD-10-CM | POA: Diagnosis not present

## 2018-06-03 DIAGNOSIS — K219 Gastro-esophageal reflux disease without esophagitis: Secondary | ICD-10-CM

## 2018-06-03 DIAGNOSIS — Z7951 Long term (current) use of inhaled steroids: Secondary | ICD-10-CM | POA: Diagnosis not present

## 2018-06-03 DIAGNOSIS — I10 Essential (primary) hypertension: Secondary | ICD-10-CM | POA: Diagnosis not present

## 2018-06-03 DIAGNOSIS — Z79899 Other long term (current) drug therapy: Secondary | ICD-10-CM

## 2018-06-03 DIAGNOSIS — R5383 Other fatigue: Secondary | ICD-10-CM | POA: Diagnosis not present

## 2018-06-03 DIAGNOSIS — C9000 Multiple myeloma not having achieved remission: Secondary | ICD-10-CM | POA: Diagnosis not present

## 2018-06-03 DIAGNOSIS — E119 Type 2 diabetes mellitus without complications: Secondary | ICD-10-CM | POA: Diagnosis not present

## 2018-06-03 DIAGNOSIS — Z7984 Long term (current) use of oral hypoglycemic drugs: Secondary | ICD-10-CM

## 2018-06-03 DIAGNOSIS — F419 Anxiety disorder, unspecified: Secondary | ICD-10-CM

## 2018-06-03 DIAGNOSIS — Z5112 Encounter for antineoplastic immunotherapy: Secondary | ICD-10-CM | POA: Diagnosis not present

## 2018-06-03 LAB — CBC WITH DIFFERENTIAL (CANCER CENTER ONLY)
Abs Immature Granulocytes: 0.01 10*3/uL (ref 0.00–0.07)
Basophils Absolute: 0 10*3/uL (ref 0.0–0.1)
Basophils Relative: 1 %
Eosinophils Absolute: 0.1 10*3/uL (ref 0.0–0.5)
Eosinophils Relative: 4 %
HCT: 31.8 % — ABNORMAL LOW (ref 36.0–46.0)
Hemoglobin: 10 g/dL — ABNORMAL LOW (ref 12.0–15.0)
Immature Granulocytes: 0 %
Lymphocytes Relative: 21 %
Lymphs Abs: 0.6 10*3/uL — ABNORMAL LOW (ref 0.7–4.0)
MCH: 28 pg (ref 26.0–34.0)
MCHC: 31.4 g/dL (ref 30.0–36.0)
MCV: 89.1 fL (ref 80.0–100.0)
Monocytes Absolute: 0.3 10*3/uL (ref 0.1–1.0)
Monocytes Relative: 10 %
Neutro Abs: 1.8 10*3/uL (ref 1.7–7.7)
Neutrophils Relative %: 64 %
Platelet Count: 118 10*3/uL — ABNORMAL LOW (ref 150–400)
RBC: 3.57 MIL/uL — ABNORMAL LOW (ref 3.87–5.11)
RDW: 16.1 % — ABNORMAL HIGH (ref 11.5–15.5)
WBC Count: 2.7 10*3/uL — ABNORMAL LOW (ref 4.0–10.5)
nRBC: 0 % (ref 0.0–0.2)

## 2018-06-03 LAB — CMP (CANCER CENTER ONLY)
ALT: 40 U/L (ref 0–44)
AST: 39 U/L (ref 15–41)
Albumin: 3.8 g/dL (ref 3.5–5.0)
Alkaline Phosphatase: 151 U/L — ABNORMAL HIGH (ref 38–126)
Anion gap: 13 (ref 5–15)
BUN: 9 mg/dL (ref 6–20)
CO2: 23 mmol/L (ref 22–32)
Calcium: 8.7 mg/dL — ABNORMAL LOW (ref 8.9–10.3)
Chloride: 101 mmol/L (ref 98–111)
Creatinine: 0.8 mg/dL (ref 0.44–1.00)
GFR, Est AFR Am: >60 mL/min (ref >60)
GFR, Estimated: >60 mL/min (ref >60)
Glucose, Bld: 249 mg/dL — ABNORMAL HIGH (ref 70–99)
Potassium: 3.9 mmol/L (ref 3.5–5.1)
Sodium: 137 mmol/L (ref 135–145)
Total Bilirubin: 0.5 mg/dL (ref 0.3–1.2)
Total Protein: 6.9 g/dL (ref 6.5–8.1)

## 2018-06-03 MED ORDER — ONDANSETRON HCL 8 MG PO TABS
8.0000 mg | ORAL_TABLET | Freq: Once | ORAL | Status: AC
  Administered 2018-06-03: 8 mg via ORAL

## 2018-06-03 MED ORDER — BORTEZOMIB CHEMO SQ INJECTION 3.5 MG (2.5MG/ML)
1.3000 mg/m2 | Freq: Once | INTRAMUSCULAR | Status: AC
  Administered 2018-06-03: 3 mg via SUBCUTANEOUS
  Filled 2018-06-03: qty 1.2

## 2018-06-03 MED ORDER — ONDANSETRON HCL 8 MG PO TABS
ORAL_TABLET | ORAL | Status: AC
  Filled 2018-06-03: qty 1

## 2018-06-03 NOTE — Progress Notes (Signed)
[place] [phone]) [phone]   Fax:([phone]  OFFICE PROGRESS NOTE  [name], MD [place] [address]  DIAGNOSIS: Plasma cell dyscrasia initially diagnosed as MGUS in September 2010, with additional symptoms suggestive of POEMS syndrome.   PRIOR THERAPY:  1) Velcade 1.3 MG/M2 subcutaneously with Decadron 40 mg by mouth on a weekly basis. First cycle 11/24/2013. She status post 31 weekly doses of treatment. 2) Velcade 1.3 MG/M2 subcutaneously and weekly basis with Decadron 40 mg by mouth weekly. First dose 02/01/2015. Status post 28 cycles. 3) Revlimid 25 mg by mouth daily for 21 days every 4 weeks with weekly Decadron 20 mg. started in 11/27/2015. Status post 3 cycles discontinued secondary to lack of response.  CURRENT THERAPY: Systemic treatment with Velcade 1.3 MG/KG weekly, Revlimid 25 mg by mouth daily for 21 days every 4 weeks in addition to Decadron 20 mg by mouth weekly. First dose 03/06/2016. Status post 27 cycles.   INTERVAL HISTORY: Brenda Conner 58 y.o. female returns to the clinic today for follow-up visit.  The patient is feeling fine today with no concerning complaints except for mild fatigue.  She denied having any chest pain, shortness of breath, cough or hemoptysis.  She denied having any fever or chills.  She has no nausea, vomiting, diarrhea or constipation.  She denied having any headache or visual changes.  She has no recent weight loss or night sweats.  She had repeat myeloma panel performed recently and she is here for evaluation and discussion of her lab results.   MEDICAL HISTORY: Past Medical History:  Diagnosis Date  . Acid reflux   . Anxiety   . Asthma   . Cancer [facility])    waldenstroms/ macroglobinulemia  . Depression   . Depression   . Diabetes mellitus without complication ([place])   . Dysuria 02/28/2016  . Hypercholesteremia   . Hypertension   . Hypothyroidism   . Macroglobulinemia  ([place])    ? POEMS syndrome  . Multiple myeloma ([place])   . Obesity   . PONV (postoperative nausea and vomiting)   . Sleep apnea    CPAP at bedtime  . Sleep apnea     ALLERGIES:  is allergic to codeine; hydrocodone; lortab [hydrocodone-acetaminophen]; onion; shellfish allergy; and amoxicillin.  MEDICATIONS:  Current Outpatient Medications  Medication Sig Dispense Refill  . acyclovir (ZOVIRAX) 400 MG tablet Take 1 tablet (400 mg total) by mouth 2 (two) times daily. 180 tablet 1  . ALPRAZolam (XANAX) 0.5 MG tablet Take 1 tablet (0.5 mg total) by mouth at bedtime as needed for anxiety. 30 tablet 0  . Azelastine HCl 0.15 % SOLN as needed.    . Blood Glucose Monitoring Suppl (ONE TOUCH ULTRA MINI) W/DEVICE KIT See admin instructions. Reported on 01/25/2015  0  . Cetirizine HCl (ZYRTEC ALLERGY PO) Take by mouth daily.    [name] dexamethasone (DECADRON) 4 MG tablet Take 1 tablet (4 mg total) by mouth once a week. Patient takes 5 tablets 1 day week prior to [facility] appointment. 40 tablet 1  . DiphenhydrAMINE HCl (BENADRYL ALLERGY PO) Take by mouth as needed.    [name] EPIPEN 2-PAK 0.3 MG/0.3ML SOAJ injection Reported on 06/21/2015  1  . Fexofenadine HCl (ALLEGRA ALLERGY PO) Take by mouth daily.    . Fluticasone-Salmeterol (ADVAIR) 100-50 MCG/DOSE AEPB Inhale 2 puffs into the lungs every 12 (twelve) hours.    [name] ibuprofen (ADVIL,MOTRIN) 100 MG  tablet Take 100 mg by mouth every 6 (six) hours as needed. Reported on 05/31/2015    . lenalidomide (REVLIMID) 25 MG capsule TAKE 1 CAPSULE BY MOUTH ONCE DAILY FOR 21 DAYS ON AND 7 DAYS OFF 21 capsule 0  . levothyroxine (SYNTHROID, LEVOTHROID) 150 MCG tablet Take 150 mcg by mouth daily before breakfast.    . lisinopril (PRINIVIL,ZESTRIL) 10 MG tablet Take 10 mg by mouth daily. auth number 05/29/2018 [id number]  3  . loperamide (IMODIUM) 2 MG capsule Take 2 mg by mouth as needed for diarrhea or loose stools.    . magic mouthwash w/lidocaine SOLN Take 5 mLs by mouth 4 (four)  times daily as needed for mouth pain. Swish, Gargle, and spit (Patient not taking: Reported on 04/29/2018) 240 mL 1  . metFORMIN (GLUCOPHAGE-XR) 500 MG 24 hr tablet Take 500 mg by mouth daily.    [name] omeprazole (PRILOSEC) 40 MG capsule Take 40 mg by mouth 2 (two) times daily.     . ondansetron (ZOFRAN-ODT) 8 MG disintegrating tablet Take 1 tablet (8 mg total) by mouth every 8 (eight) hours as needed. Reported on 05/31/2015 20 tablet 2  . pravastatin (PRAVACHOL) 40 MG tablet Take 40 mg by mouth every evening.     [name] PROAIR HFA 108 (90 Base) MCG/ACT inhaler Reported on 06/21/2015  1  . sertraline (ZOLOFT) 50 MG tablet Take 3 tablets (150 mg total) by mouth daily. 270 tablet 2  . verapamil (CALAN-SR) 240 MG CR tablet Take 240 mg by mouth 2 (two) times daily.    [name] warfarin (COUMADIN) 2 MG tablet Take 1 tablet (2 mg total) by mouth daily. 90 tablet 0   No current facility-administered medications for this visit.     SURGICAL HISTORY:  Past Surgical History:  Procedure Laterality Date  . ABLATION  09/2007   HTA and polyp resection  . BACK SURGERY  03/2004   herniation, L4-L5  . Bil Laprascopic knee surgery    . BONE MARROW BIOPSY     2011  . BONE MARROW BIOPSY  4/14  . CHOLECYSTECTOMY    . FOOT SURGERY  1998  . KNEE ARTHROSCOPY W/ MENISCAL REPAIR  10/10 ; 3/11  . LAPAROSCOPIC CHOLECYSTECTOMY  1997  . NASAL SINUS SURGERY  2004  . UTERINE FIBROID EMBOLIZATION      REVIEW OF SYSTEMS:  Constitutional: positive for fatigue Eyes: negative Ears, nose, mouth, throat, and face: negative Respiratory: negative Cardiovascular: negative Gastrointestinal: negative Genitourinary:negative Integument/breast: negative Hematologic/lymphatic: negative Musculoskeletal:negative Neurological: negative Behavioral/Psych: negative Endocrine: negative Allergic/Immunologic: negative   PHYSICAL EXAMINATION: General appearance: alert, cooperative, fatigued and no distress Head: Normocephalic, without obvious  abnormality, atraumatic Neck: no adenopathy Lymph nodes: Cervical, supraclavicular, and axillary nodes normal. Resp: clear to auscultation bilaterally Back: symmetric, no curvature. ROM normal. No CVA tenderness. Cardio: regular rate and rhythm, S1, S2 normal, no murmur, click, rub or gallop GI: soft, non-tender; bowel sounds normal; no masses,  no organomegaly Extremities: extremities normal, atraumatic, no cyanosis or edema Neurologic: Alert and oriented X 3, normal strength and tone. Normal symmetric reflexes. Normal coordination and gait  ECOG PERFORMANCE STATUS: 1 - Symptomatic but completely ambulatory  Blood pressure 129/63, pulse 70, temperature 97.8 F (36.6 C), temperature source Oral, resp. rate 18, height '5\' 7"'$  (1.702 m), weight 245 lb 9.6 oz (111.4 kg), SpO2 100 %.  LABORATORY DATA: Lab Results  Component Value Date   WBC 2.7 (L) 06/03/2018   HGB 10.0 (L) 06/03/2018   HCT 31.8 (L) 06/03/2018  MCV 89.1 06/03/2018   PLT 118 (L) 06/03/2018      Chemistry      Component Value Date/Time   NA 136 05/27/2018 0813   NA 138 11/14/2016 0824   K 3.7 05/27/2018 0813   K 4.3 11/14/2016 0824   CL 102 05/27/2018 0813   CL 104 04/07/2012 1415   CO2 22 05/27/2018 0813   CO2 23 11/14/2016 0824   BUN 10 05/27/2018 0813   BUN 9.1 11/14/2016 0824   CREATININE 0.79 05/27/2018 0813   CREATININE 0.7 11/14/2016 0824      Component Value Date/Time   CALCIUM 9.0 05/27/2018 0813   CALCIUM 9.6 11/14/2016 0824   ALKPHOS 133 (H) 05/27/2018 0813   ALKPHOS 94 11/14/2016 0824   AST 42 (H) 05/27/2018 0813   AST 28 11/14/2016 0824   ALT 35 05/27/2018 0813   ALT 24 11/14/2016 0824   BILITOT 0.5 05/27/2018 0813   BILITOT 0.41 11/14/2016 0824      ASSESSMENT AND PLAN:  This is a very pleasant 58 years old white female with multiple myeloma status post several chemotherapy regimens. The patient has been on treatment with subcutaneous weekly Velcade as well as Revlimid and  DecadronStatus post 27 cycles.  She continues to tolerate this treatment well with no concerning adverse effects. The patient had a repeat myeloma panel performed recently.  I discussed the lab results with the patient today.  Her myeloma panel showed further improvement of her disease.  I gave the patient the option of taking a break off treatment versus continuation of the treatment with the same regimen.  The patient mentioned that she is working from home these days and she would like to continue her treatment for now and consider taking a break off treatment in the future. She will proceed with cycle #28 today. I will see her back for follow-up visit in 4 weeks for evaluation before the next cycle of her treatment. She was advised to call immediately if she has any concerning symptoms in the interval. All questions were answered. The patient knows to call the clinic with any problems, questions or concerns. We can certainly see the patient much sooner if necessary.  Disclaimer: This note was dictated with voice recognition software. Similar sounding words can inadvertently be transcribed and may not be corrected upon review.

## 2018-06-05 ENCOUNTER — Telehealth: Payer: Self-pay | Admitting: Internal Medicine

## 2018-06-05 NOTE — Telephone Encounter (Signed)
Scheduled appt per 5/26 los - pt is aware of appt date and time

## 2018-06-09 ENCOUNTER — Other Ambulatory Visit: Payer: Self-pay | Admitting: Medical Oncology

## 2018-06-09 DIAGNOSIS — C9000 Multiple myeloma not having achieved remission: Secondary | ICD-10-CM

## 2018-06-10 ENCOUNTER — Inpatient Hospital Stay: Payer: BC Managed Care – PPO | Attending: Internal Medicine

## 2018-06-10 ENCOUNTER — Inpatient Hospital Stay: Payer: BC Managed Care – PPO

## 2018-06-10 ENCOUNTER — Other Ambulatory Visit: Payer: Self-pay

## 2018-06-10 VITALS — BP 130/61 | HR 60 | Temp 98.5°F | Resp 20

## 2018-06-10 DIAGNOSIS — C9000 Multiple myeloma not having achieved remission: Secondary | ICD-10-CM | POA: Insufficient documentation

## 2018-06-10 DIAGNOSIS — Z5112 Encounter for antineoplastic immunotherapy: Secondary | ICD-10-CM | POA: Diagnosis not present

## 2018-06-10 DIAGNOSIS — I10 Essential (primary) hypertension: Secondary | ICD-10-CM | POA: Insufficient documentation

## 2018-06-10 DIAGNOSIS — Z9221 Personal history of antineoplastic chemotherapy: Secondary | ICD-10-CM | POA: Diagnosis not present

## 2018-06-10 DIAGNOSIS — Z79899 Other long term (current) drug therapy: Secondary | ICD-10-CM | POA: Diagnosis not present

## 2018-06-10 LAB — CMP (CANCER CENTER ONLY)
ALT: 36 U/L (ref 0–44)
AST: 44 U/L — ABNORMAL HIGH (ref 15–41)
Albumin: 3.7 g/dL (ref 3.5–5.0)
Alkaline Phosphatase: 137 U/L — ABNORMAL HIGH (ref 38–126)
Anion gap: 11 (ref 5–15)
BUN: 9 mg/dL (ref 6–20)
CO2: 21 mmol/L — ABNORMAL LOW (ref 22–32)
Calcium: 9 mg/dL (ref 8.9–10.3)
Chloride: 104 mmol/L (ref 98–111)
Creatinine: 0.81 mg/dL (ref 0.44–1.00)
GFR, Est AFR Am: >60 mL/min (ref >60)
GFR, Estimated: >60 mL/min (ref >60)
Glucose, Bld: 202 mg/dL — ABNORMAL HIGH (ref 70–99)
Potassium: 4.2 mmol/L (ref 3.5–5.1)
Sodium: 136 mmol/L (ref 135–145)
Total Bilirubin: 0.5 mg/dL (ref 0.3–1.2)
Total Protein: 7 g/dL (ref 6.5–8.1)

## 2018-06-10 LAB — CBC WITH DIFFERENTIAL (CANCER CENTER ONLY)
Abs Immature Granulocytes: 0.01 10*3/uL (ref 0.00–0.07)
Basophils Absolute: 0.1 10*3/uL (ref 0.0–0.1)
Basophils Relative: 3 %
Eosinophils Absolute: 0.1 10*3/uL (ref 0.0–0.5)
Eosinophils Relative: 2 %
HCT: 31.7 % — ABNORMAL LOW (ref 36.0–46.0)
Hemoglobin: 10 g/dL — ABNORMAL LOW (ref 12.0–15.0)
Immature Granulocytes: 0 %
Lymphocytes Relative: 22 %
Lymphs Abs: 0.7 10*3/uL (ref 0.7–4.0)
MCH: 28.3 pg (ref 26.0–34.0)
MCHC: 31.5 g/dL (ref 30.0–36.0)
MCV: 89.8 fL (ref 80.0–100.0)
Monocytes Absolute: 0.4 10*3/uL (ref 0.1–1.0)
Monocytes Relative: 13 %
Neutro Abs: 1.9 10*3/uL (ref 1.7–7.7)
Neutrophils Relative %: 60 %
Platelet Count: 121 10*3/uL — ABNORMAL LOW (ref 150–400)
RBC: 3.53 MIL/uL — ABNORMAL LOW (ref 3.87–5.11)
RDW: 16.1 % — ABNORMAL HIGH (ref 11.5–15.5)
WBC Count: 3.1 10*3/uL — ABNORMAL LOW (ref 4.0–10.5)
nRBC: 0 % (ref 0.0–0.2)

## 2018-06-10 MED ORDER — ONDANSETRON HCL 8 MG PO TABS
ORAL_TABLET | ORAL | Status: AC
  Filled 2018-06-10: qty 1

## 2018-06-10 MED ORDER — ONDANSETRON HCL 8 MG PO TABS
8.0000 mg | ORAL_TABLET | Freq: Once | ORAL | Status: AC
  Administered 2018-06-10: 8 mg via ORAL

## 2018-06-10 MED ORDER — BORTEZOMIB CHEMO SQ INJECTION 3.5 MG (2.5MG/ML)
1.3000 mg/m2 | Freq: Once | INTRAMUSCULAR | Status: AC
  Administered 2018-06-10: 3 mg via SUBCUTANEOUS
  Filled 2018-06-10: qty 1.2

## 2018-06-10 NOTE — Patient Instructions (Signed)
Coronavirus (COVID-19) Are you at risk?  Are you at risk for the Coronavirus (COVID-19)?  To be considered HIGH RISK for Coronavirus (COVID-19), you have to meet the following criteria:  . Traveled to [place], [place], [place], [place] or [place]; or in the [place] to [place], [place], [place], or [place]; and have fever, cough, and shortness of breath within the last 2 weeks of travel OR . Been in close contact with a person diagnosed with COVID-19 within the last 2 weeks and have fever, cough, and shortness of breath . IF YOU DO NOT MEET THESE CRITERIA, YOU ARE CONSIDERED LOW RISK FOR COVID-19.  What to do if you are HIGH RISK for COVID-19?  . If you are having a medical emergency, call 911. . Seek medical care right away. Before you go to a doctor's office, urgent care or emergency department, call ahead and tell them about your recent travel, contact with someone diagnosed with COVID-19, and your symptoms. You should receive instructions from your physician's office regarding next steps of care.  . When you arrive at healthcare provider, tell the healthcare staff immediately you have returned from visiting [place], [place], [place], [place] or [place]; or traveled in the [place] to [place], [place], [place], or [place]; in the last two weeks or you have been in close contact with a person diagnosed with COVID-19 in the last 2 weeks.   . Tell the health care staff about your symptoms: fever, cough and shortness of breath. . After you have been seen by a medical provider, you will be either: o Tested for (COVID-19) and discharged home on quarantine except to seek medical care if symptoms worsen, and asked to  - Stay home and avoid contact with others until you get your results (4-5 days)  - Avoid travel on public transportation if possible (such as bus, train, or airplane) or o Sent to the Emergency Department by EMS for evaluation, COVID-19 testing, and possible  admission depending on your condition and test results.  What to do if you are LOW RISK for COVID-19?  Reduce your risk of any infection by using the same precautions used for avoiding the common cold or flu:  . Wash your hands often with soap and warm water for at least 20 seconds.  If soap and water are not readily available, use an alcohol-based hand sanitizer with at least 60% alcohol.  . If coughing or sneezing, cover your mouth and nose by coughing or sneezing into the elbow areas of your shirt or coat, into a tissue or into your sleeve (not your hands). . Avoid shaking hands with others and consider head nods or verbal greetings only. . Avoid touching your eyes, nose, or mouth with unwashed hands.  . Avoid close contact with people who are sick. . Avoid places or events with large numbers of people in one location, like concerts or sporting events. . Carefully consider travel plans you have or are making. . If you are planning any travel outside or inside the [place], visit the CDC's Travelers' Health webpage for the latest health notices. . If you have some symptoms but not all symptoms, continue to monitor at home and seek medical attention if your symptoms worsen. . If you are having a medical emergency, call 911.   ADDITIONAL HEALTHCARE OPTIONS FOR PATIENTS  [facility] / e-Visit: [email or web address]         [facility]: [phone]  [facility]: [phone]                   [facility]: [phone]    [facility] Discharge Instructions for Patients Receiving Chemotherapy  Today you received the following chemotherapy agents Velcade  To help prevent nausea and vomiting after your treatment, we encourage you to take your nausea medication as directed   If you develop nausea and vomiting that is not controlled by your nausea medication, call the clinic.   BELOW ARE  SYMPTOMS THAT SHOULD BE REPORTED IMMEDIATELY:  *FEVER GREATER THAN 100.5 F  *CHILLS WITH OR WITHOUT FEVER  NAUSEA AND VOMITING THAT IS NOT CONTROLLED WITH YOUR NAUSEA MEDICATION  *UNUSUAL SHORTNESS OF BREATH  *UNUSUAL BRUISING OR BLEEDING  TENDERNESS IN MOUTH AND THROAT WITH OR WITHOUT PRESENCE OF ULCERS  *URINARY PROBLEMS  *BOWEL PROBLEMS  UNUSUAL RASH Items with * indicate a potential emergency and should be followed up as soon as possible.  Feel free to call the clinic should you have any questions or concerns. The clinic phone number is [phone].  Please show the CHEMO ALERT CARD at check-in to the Emergency Department and triage nurse.   

## 2018-06-16 ENCOUNTER — Other Ambulatory Visit: Payer: Self-pay | Admitting: Medical Oncology

## 2018-06-16 DIAGNOSIS — C9000 Multiple myeloma not having achieved remission: Secondary | ICD-10-CM

## 2018-06-17 ENCOUNTER — Inpatient Hospital Stay: Payer: BC Managed Care – PPO

## 2018-06-17 ENCOUNTER — Other Ambulatory Visit: Payer: Self-pay

## 2018-06-17 VITALS — BP 130/48 | HR 61 | Temp 98.5°F | Resp 18

## 2018-06-17 DIAGNOSIS — C9000 Multiple myeloma not having achieved remission: Secondary | ICD-10-CM

## 2018-06-17 DIAGNOSIS — Z9221 Personal history of antineoplastic chemotherapy: Secondary | ICD-10-CM | POA: Diagnosis not present

## 2018-06-17 DIAGNOSIS — Z5112 Encounter for antineoplastic immunotherapy: Secondary | ICD-10-CM | POA: Diagnosis not present

## 2018-06-17 DIAGNOSIS — I10 Essential (primary) hypertension: Secondary | ICD-10-CM | POA: Diagnosis not present

## 2018-06-17 DIAGNOSIS — Z79899 Other long term (current) drug therapy: Secondary | ICD-10-CM | POA: Diagnosis not present

## 2018-06-17 LAB — CBC WITH DIFFERENTIAL (CANCER CENTER ONLY)
Abs Immature Granulocytes: 0.02 10*3/uL (ref 0.00–0.07)
Basophils Absolute: 0 10*3/uL (ref 0.0–0.1)
Basophils Relative: 2 %
Eosinophils Absolute: 0.1 10*3/uL (ref 0.0–0.5)
Eosinophils Relative: 3 %
HCT: 32.3 % — ABNORMAL LOW (ref 36.0–46.0)
Hemoglobin: 10.2 g/dL — ABNORMAL LOW (ref 12.0–15.0)
Immature Granulocytes: 1 %
Lymphocytes Relative: 21 %
Lymphs Abs: 0.6 10*3/uL — ABNORMAL LOW (ref 0.7–4.0)
MCH: 27.8 pg (ref 26.0–34.0)
MCHC: 31.6 g/dL (ref 30.0–36.0)
MCV: 88 fL (ref 80.0–100.0)
Monocytes Absolute: 0.2 10*3/uL (ref 0.1–1.0)
Monocytes Relative: 7 %
Neutro Abs: 1.8 10*3/uL (ref 1.7–7.7)
Neutrophils Relative %: 66 %
Platelet Count: 130 10*3/uL — ABNORMAL LOW (ref 150–400)
RBC: 3.67 MIL/uL — ABNORMAL LOW (ref 3.87–5.11)
RDW: 15.7 % — ABNORMAL HIGH (ref 11.5–15.5)
WBC Count: 2.6 10*3/uL — ABNORMAL LOW (ref 4.0–10.5)
nRBC: 0 % (ref 0.0–0.2)

## 2018-06-17 LAB — CMP (CANCER CENTER ONLY)
ALT: 35 U/L (ref 0–44)
AST: 38 U/L (ref 15–41)
Albumin: 3.8 g/dL (ref 3.5–5.0)
Alkaline Phosphatase: 140 U/L — ABNORMAL HIGH (ref 38–126)
Anion gap: 14 (ref 5–15)
BUN: 10 mg/dL (ref 6–20)
CO2: 21 mmol/L — ABNORMAL LOW (ref 22–32)
Calcium: 9.2 mg/dL (ref 8.9–10.3)
Chloride: 102 mmol/L (ref 98–111)
Creatinine: 0.85 mg/dL (ref 0.44–1.00)
GFR, Est AFR Am: >60 mL/min (ref >60)
GFR, Estimated: >60 mL/min (ref >60)
Glucose, Bld: 227 mg/dL — ABNORMAL HIGH (ref 70–99)
Potassium: 3.9 mmol/L (ref 3.5–5.1)
Sodium: 137 mmol/L (ref 135–145)
Total Bilirubin: 0.6 mg/dL (ref 0.3–1.2)
Total Protein: 7.1 g/dL (ref 6.5–8.1)

## 2018-06-17 MED ORDER — ONDANSETRON HCL 8 MG PO TABS
8.0000 mg | ORAL_TABLET | Freq: Once | ORAL | Status: AC
  Administered 2018-06-17: 8 mg via ORAL

## 2018-06-17 MED ORDER — BORTEZOMIB CHEMO SQ INJECTION 3.5 MG (2.5MG/ML)
1.3000 mg/m2 | Freq: Once | INTRAMUSCULAR | Status: AC
  Administered 2018-06-17: 3 mg via SUBCUTANEOUS
  Filled 2018-06-17: qty 1.2

## 2018-06-17 MED ORDER — ONDANSETRON HCL 8 MG PO TABS
ORAL_TABLET | ORAL | Status: AC
  Filled 2018-06-17: qty 1

## 2018-06-17 NOTE — Patient Instructions (Signed)
[facility] Discharge Instructions for Patients Receiving Chemotherapy  Today you received the following chemotherapy agents Velcade.  To help prevent nausea and vomiting after your treatment, we encourage you to take your nausea medication as directed.  If you develop nausea and vomiting that is not controlled by your nausea medication, call the clinic.   BELOW ARE SYMPTOMS THAT SHOULD BE REPORTED IMMEDIATELY:  *FEVER GREATER THAN 100.5 F  *CHILLS WITH OR WITHOUT FEVER  NAUSEA AND VOMITING THAT IS NOT CONTROLLED WITH YOUR NAUSEA MEDICATION  *UNUSUAL SHORTNESS OF BREATH  *UNUSUAL BRUISING OR BLEEDING  TENDERNESS IN MOUTH AND THROAT WITH OR WITHOUT PRESENCE OF ULCERS  *URINARY PROBLEMS  *BOWEL PROBLEMS  UNUSUAL RASH Items with * indicate a potential emergency and should be followed up as soon as possible.  Feel free to call the clinic should you have any questions or concerns. The clinic phone number is [phone].  Please show the CHEMO ALERT CARD at check-in to the Emergency Department and triage nurse.   

## 2018-06-23 ENCOUNTER — Other Ambulatory Visit: Payer: Self-pay | Admitting: Medical Oncology

## 2018-06-23 DIAGNOSIS — C9000 Multiple myeloma not having achieved remission: Secondary | ICD-10-CM

## 2018-06-24 ENCOUNTER — Other Ambulatory Visit: Payer: Self-pay

## 2018-06-24 ENCOUNTER — Inpatient Hospital Stay: Payer: BC Managed Care – PPO

## 2018-06-24 ENCOUNTER — Other Ambulatory Visit: Payer: Self-pay | Admitting: Internal Medicine

## 2018-06-24 ENCOUNTER — Other Ambulatory Visit: Payer: Self-pay | Admitting: *Deleted

## 2018-06-24 VITALS — BP 132/85 | HR 62 | Temp 97.8°F | Resp 18

## 2018-06-24 DIAGNOSIS — Z9221 Personal history of antineoplastic chemotherapy: Secondary | ICD-10-CM | POA: Diagnosis not present

## 2018-06-24 DIAGNOSIS — C9000 Multiple myeloma not having achieved remission: Secondary | ICD-10-CM

## 2018-06-24 DIAGNOSIS — I10 Essential (primary) hypertension: Secondary | ICD-10-CM | POA: Diagnosis not present

## 2018-06-24 DIAGNOSIS — Z5112 Encounter for antineoplastic immunotherapy: Secondary | ICD-10-CM | POA: Diagnosis not present

## 2018-06-24 DIAGNOSIS — Z79899 Other long term (current) drug therapy: Secondary | ICD-10-CM | POA: Diagnosis not present

## 2018-06-24 LAB — CMP (CANCER CENTER ONLY)
ALT: 35 U/L (ref 0–44)
AST: 36 U/L (ref 15–41)
Albumin: 3.8 g/dL (ref 3.5–5.0)
Alkaline Phosphatase: 134 U/L — ABNORMAL HIGH (ref 38–126)
Anion gap: 14 (ref 5–15)
BUN: 8 mg/dL (ref 6–20)
CO2: 21 mmol/L — ABNORMAL LOW (ref 22–32)
Calcium: 8.9 mg/dL (ref 8.9–10.3)
Chloride: 103 mmol/L (ref 98–111)
Creatinine: 0.78 mg/dL (ref 0.44–1.00)
GFR, Est AFR Am: >60 mL/min (ref >60)
GFR, Estimated: >60 mL/min (ref >60)
Glucose, Bld: 195 mg/dL — ABNORMAL HIGH (ref 70–99)
Potassium: 3.8 mmol/L (ref 3.5–5.1)
Sodium: 138 mmol/L (ref 135–145)
Total Bilirubin: 0.6 mg/dL (ref 0.3–1.2)
Total Protein: 6.9 g/dL (ref 6.5–8.1)

## 2018-06-24 LAB — CBC WITH DIFFERENTIAL (CANCER CENTER ONLY)
Abs Immature Granulocytes: 0.02 10*3/uL (ref 0.00–0.07)
Basophils Absolute: 0 10*3/uL (ref 0.0–0.1)
Basophils Relative: 1 %
Eosinophils Absolute: 0.1 10*3/uL (ref 0.0–0.5)
Eosinophils Relative: 3 %
HCT: 33.7 % — ABNORMAL LOW (ref 36.0–46.0)
Hemoglobin: 10.5 g/dL — ABNORMAL LOW (ref 12.0–15.0)
Immature Granulocytes: 1 %
Lymphocytes Relative: 19 %
Lymphs Abs: 0.5 10*3/uL — ABNORMAL LOW (ref 0.7–4.0)
MCH: 28.2 pg (ref 26.0–34.0)
MCHC: 31.2 g/dL (ref 30.0–36.0)
MCV: 90.6 fL (ref 80.0–100.0)
Monocytes Absolute: 0.3 10*3/uL (ref 0.1–1.0)
Monocytes Relative: 10 %
Neutro Abs: 1.9 10*3/uL (ref 1.7–7.7)
Neutrophils Relative %: 66 %
Platelet Count: 98 10*3/uL — ABNORMAL LOW (ref 150–400)
RBC: 3.72 MIL/uL — ABNORMAL LOW (ref 3.87–5.11)
RDW: 16.3 % — ABNORMAL HIGH (ref 11.5–15.5)
WBC Count: 2.8 10*3/uL — ABNORMAL LOW (ref 4.0–10.5)
nRBC: 0 % (ref 0.0–0.2)

## 2018-06-24 MED ORDER — ONDANSETRON HCL 8 MG PO TABS
ORAL_TABLET | ORAL | Status: AC
  Filled 2018-06-24: qty 1

## 2018-06-24 MED ORDER — PROCHLORPERAZINE MALEATE 10 MG PO TABS
ORAL_TABLET | ORAL | Status: AC
  Filled 2018-06-24: qty 1

## 2018-06-24 MED ORDER — BORTEZOMIB CHEMO SQ INJECTION 3.5 MG (2.5MG/ML)
1.3000 mg/m2 | Freq: Once | INTRAMUSCULAR | Status: AC
  Administered 2018-06-24: 3 mg via SUBCUTANEOUS
  Filled 2018-06-24: qty 1.2

## 2018-06-24 MED ORDER — LENALIDOMIDE 25 MG PO CAPS: ORAL_CAPSULE | ORAL | 0 refills | Status: DC

## 2018-06-24 MED ORDER — ONDANSETRON HCL 8 MG PO TABS
8.0000 mg | ORAL_TABLET | Freq: Once | ORAL | Status: AC
  Administered 2018-06-24: 8 mg via ORAL

## 2018-06-24 NOTE — Progress Notes (Signed)
Per Dr. Julien Nordmann, patient is OK to treat today, with platelets of 98

## 2018-06-24 NOTE — Patient Instructions (Signed)
[facility] Discharge Instructions for Patients Receiving Chemotherapy  Today you received the following chemotherapy agents Velcade.  To help prevent nausea and vomiting after your treatment, we encourage you to take your nausea medication as directed.  If you develop nausea and vomiting that is not controlled by your nausea medication, call the clinic.   BELOW ARE SYMPTOMS THAT SHOULD BE REPORTED IMMEDIATELY:  *FEVER GREATER THAN 100.5 F  *CHILLS WITH OR WITHOUT FEVER  NAUSEA AND VOMITING THAT IS NOT CONTROLLED WITH YOUR NAUSEA MEDICATION  *UNUSUAL SHORTNESS OF BREATH  *UNUSUAL BRUISING OR BLEEDING  TENDERNESS IN MOUTH AND THROAT WITH OR WITHOUT PRESENCE OF ULCERS  *URINARY PROBLEMS  *BOWEL PROBLEMS  UNUSUAL RASH Items with * indicate a potential emergency and should be followed up as soon as possible.  Feel free to call the clinic should you have any questions or concerns. The clinic phone number is [phone].  Please show the CHEMO ALERT CARD at check-in to the Emergency Department and triage nurse.   

## 2018-06-30 ENCOUNTER — Other Ambulatory Visit: Payer: Self-pay | Admitting: Physician Assistant

## 2018-06-30 DIAGNOSIS — C9000 Multiple myeloma not having achieved remission: Secondary | ICD-10-CM

## 2018-06-30 DIAGNOSIS — G4733 Obstructive sleep apnea (adult) (pediatric): Secondary | ICD-10-CM | POA: Diagnosis not present

## 2018-07-01 ENCOUNTER — Other Ambulatory Visit: Payer: Self-pay

## 2018-07-01 ENCOUNTER — Inpatient Hospital Stay: Payer: BC Managed Care – PPO

## 2018-07-01 ENCOUNTER — Inpatient Hospital Stay (HOSPITAL_BASED_OUTPATIENT_CLINIC_OR_DEPARTMENT_OTHER): Payer: BC Managed Care – PPO | Admitting: Physician Assistant

## 2018-07-01 ENCOUNTER — Encounter: Payer: Self-pay | Admitting: Physician Assistant

## 2018-07-01 VITALS — BP 149/59 | HR 76 | Temp 97.8°F | Resp 18 | Ht 67.0 in | Wt 246.1 lb

## 2018-07-01 DIAGNOSIS — Z5111 Encounter for antineoplastic chemotherapy: Secondary | ICD-10-CM

## 2018-07-01 DIAGNOSIS — Z79899 Other long term (current) drug therapy: Secondary | ICD-10-CM

## 2018-07-01 DIAGNOSIS — C9000 Multiple myeloma not having achieved remission: Secondary | ICD-10-CM | POA: Diagnosis not present

## 2018-07-01 DIAGNOSIS — I10 Essential (primary) hypertension: Secondary | ICD-10-CM

## 2018-07-01 DIAGNOSIS — Z9221 Personal history of antineoplastic chemotherapy: Secondary | ICD-10-CM

## 2018-07-01 DIAGNOSIS — Z5112 Encounter for antineoplastic immunotherapy: Secondary | ICD-10-CM | POA: Diagnosis not present

## 2018-07-01 LAB — CBC WITH DIFFERENTIAL (CANCER CENTER ONLY)
Abs Immature Granulocytes: 0.02 10*3/uL (ref 0.00–0.07)
Basophils Absolute: 0 10*3/uL (ref 0.0–0.1)
Basophils Relative: 1 %
Eosinophils Absolute: 0.1 10*3/uL (ref 0.0–0.5)
Eosinophils Relative: 4 %
HCT: 32 % — ABNORMAL LOW (ref 36.0–46.0)
Hemoglobin: 10.2 g/dL — ABNORMAL LOW (ref 12.0–15.0)
Immature Granulocytes: 1 %
Lymphocytes Relative: 18 %
Lymphs Abs: 0.6 10*3/uL — ABNORMAL LOW (ref 0.7–4.0)
MCH: 27.9 pg (ref 26.0–34.0)
MCHC: 31.9 g/dL (ref 30.0–36.0)
MCV: 87.4 fL (ref 80.0–100.0)
Monocytes Absolute: 0.4 10*3/uL (ref 0.1–1.0)
Monocytes Relative: 12 %
Neutro Abs: 2.1 10*3/uL (ref 1.7–7.7)
Neutrophils Relative %: 64 %
Platelet Count: 124 10*3/uL — ABNORMAL LOW (ref 150–400)
RBC: 3.66 MIL/uL — ABNORMAL LOW (ref 3.87–5.11)
RDW: 16.1 % — ABNORMAL HIGH (ref 11.5–15.5)
WBC Count: 3.3 10*3/uL — ABNORMAL LOW (ref 4.0–10.5)
nRBC: 0 % (ref 0.0–0.2)

## 2018-07-01 LAB — CMP (CANCER CENTER ONLY)
ALT: 38 U/L (ref 0–44)
AST: 37 U/L (ref 15–41)
Albumin: 3.8 g/dL (ref 3.5–5.0)
Alkaline Phosphatase: 147 U/L — ABNORMAL HIGH (ref 38–126)
Anion gap: 12 (ref 5–15)
BUN: 9 mg/dL (ref 6–20)
CO2: 21 mmol/L — ABNORMAL LOW (ref 22–32)
Calcium: 9 mg/dL (ref 8.9–10.3)
Chloride: 103 mmol/L (ref 98–111)
Creatinine: 0.82 mg/dL (ref 0.44–1.00)
GFR, Est AFR Am: >60 mL/min (ref >60)
GFR, Estimated: >60 mL/min (ref >60)
Glucose, Bld: 237 mg/dL — ABNORMAL HIGH (ref 70–99)
Potassium: 3.5 mmol/L (ref 3.5–5.1)
Sodium: 136 mmol/L (ref 135–145)
Total Bilirubin: 0.5 mg/dL (ref 0.3–1.2)
Total Protein: 7 g/dL (ref 6.5–8.1)

## 2018-07-01 MED ORDER — ONDANSETRON HCL 8 MG PO TABS
ORAL_TABLET | ORAL | Status: AC
  Filled 2018-07-01: qty 1

## 2018-07-01 MED ORDER — ONDANSETRON HCL 8 MG PO TABS
8.0000 mg | ORAL_TABLET | Freq: Once | ORAL | Status: AC
  Administered 2018-07-01: 8 mg via ORAL

## 2018-07-01 MED ORDER — BORTEZOMIB CHEMO SQ INJECTION 3.5 MG (2.5MG/ML)
1.3000 mg/m2 | Freq: Once | INTRAMUSCULAR | Status: AC
  Administered 2018-07-01: 16:00:00 3 mg via SUBCUTANEOUS
  Filled 2018-07-01: qty 1.2

## 2018-07-01 NOTE — Patient Instructions (Signed)
Coronavirus (COVID-19) Are you at risk?  Are you at risk for the Coronavirus (COVID-19)?  To be considered HIGH RISK for Coronavirus (COVID-19), you have to meet the following criteria:  . Traveled to [place], [place], [place], [place] or [place]; or in the [place] to [place], [place], [place], or [place]; and have fever, cough, and shortness of breath within the last 2 weeks of travel OR . Been in close contact with a person diagnosed with COVID-19 within the last 2 weeks and have fever, cough, and shortness of breath . IF YOU DO NOT MEET THESE CRITERIA, YOU ARE CONSIDERED LOW RISK FOR COVID-19.  What to do if you are HIGH RISK for COVID-19?  . If you are having a medical emergency, call 911. . Seek medical care right away. Before you go to a doctor's office, urgent care or emergency department, call ahead and tell them about your recent travel, contact with someone diagnosed with COVID-19, and your symptoms. You should receive instructions from your physician's office regarding next steps of care.  . When you arrive at healthcare provider, tell the healthcare staff immediately you have returned from visiting [place], [place], [place], [place] or [place]; or traveled in the [place] to [place], [place], [place], or [place]; in the last two weeks or you have been in close contact with a person diagnosed with COVID-19 in the last 2 weeks.   . Tell the health care staff about your symptoms: fever, cough and shortness of breath. . After you have been seen by a medical provider, you will be either: o Tested for (COVID-19) and discharged home on quarantine except to seek medical care if symptoms worsen, and asked to  - Stay home and avoid contact with others until you get your results (4-5 days)  - Avoid travel on public transportation if possible (such as bus, train, or airplane) or o Sent to the Emergency Department by EMS for evaluation, COVID-19 testing, and possible  admission depending on your condition and test results.  What to do if you are LOW RISK for COVID-19?  Reduce your risk of any infection by using the same precautions used for avoiding the common cold or flu:  . Wash your hands often with soap and warm water for at least 20 seconds.  If soap and water are not readily available, use an alcohol-based hand sanitizer with at least 60% alcohol.  . If coughing or sneezing, cover your mouth and nose by coughing or sneezing into the elbow areas of your shirt or coat, into a tissue or into your sleeve (not your hands). . Avoid shaking hands with others and consider head nods or verbal greetings only. . Avoid touching your eyes, nose, or mouth with unwashed hands.  . Avoid close contact with people who are sick. . Avoid places or events with large numbers of people in one location, like concerts or sporting events. . Carefully consider travel plans you have or are making. . If you are planning any travel outside or inside the [place], visit the CDC's Travelers' Health webpage for the latest health notices. . If you have some symptoms but not all symptoms, continue to monitor at home and seek medical attention if your symptoms worsen. . If you are having a medical emergency, call 911.   ADDITIONAL HEALTHCARE OPTIONS FOR PATIENTS  [facility] / e-Visit: [email or web address]         [facility]: [phone]  [facility]: [phone]                   [facility]: [phone]    [facility] Discharge Instructions for Patients Receiving Chemotherapy  Today you received the following chemotherapy agents Velcade  To help prevent nausea and vomiting after your treatment, we encourage you to take your nausea medication as directed   If you develop nausea and vomiting that is not controlled by your nausea medication, call the clinic.   BELOW ARE  SYMPTOMS THAT SHOULD BE REPORTED IMMEDIATELY:  *FEVER GREATER THAN 100.5 F  *CHILLS WITH OR WITHOUT FEVER  NAUSEA AND VOMITING THAT IS NOT CONTROLLED WITH YOUR NAUSEA MEDICATION  *UNUSUAL SHORTNESS OF BREATH  *UNUSUAL BRUISING OR BLEEDING  TENDERNESS IN MOUTH AND THROAT WITH OR WITHOUT PRESENCE OF ULCERS  *URINARY PROBLEMS  *BOWEL PROBLEMS  UNUSUAL RASH Items with * indicate a potential emergency and should be followed up as soon as possible.  Feel free to call the clinic should you have any questions or concerns. The clinic phone number is [phone].  Please show the CHEMO ALERT CARD at check-in to the Emergency Department and triage nurse.   

## 2018-07-01 NOTE — Progress Notes (Signed)
[facility] OFFICE PROGRESS NOTE  [name], MD [place] [address]  DIAGNOSIS: Plasma cell dyscrasia initially diagnosed as MGUS in September 2010, with additional symptoms suggestive of POEMS syndrome.   PRIOR THERAPY:  1) Velcade 1.3 MG/M2 subcutaneously with Decadron 40 mg by mouth on a weekly basis. First cycle 11/24/2013. She status post 31 weekly doses of treatment. 2) Velcade 1.3 MG/M2 subcutaneously and weekly basis with Decadron 40 mg by mouth weekly. First dose 02/01/2015. Status post 28 cycles. 3) Revlimid 25 mg by mouth daily for 21 days every 4 weeks with weekly Decadron 20 mg. started in 11/27/2015. Status post 3 cycles discontinued secondary to lack of response.  CURRENT THERAPY: Systemic treatment with Velcade 1.3 MG/KG weekly, Revlimid 25 mg by mouth daily for 21 days every 4 weeks in addition to Decadron 20 mg by mouth weekly. First dose 03/06/2016. Status post 28 cycles.  INTERVAL HISTORY: Brenda Conner 58 y.o. female returns to the clinic for a follow up visit. The patient is feeling well today without any concerning complaints except for fatigue, some numbness in her lower extremities bilaterally, and vaginal yeast infections. She uses OTC medications for her yeast infections which she states is working well for her. She denies any fevers, chills, lymphadenopathy, or weight loss. She endorses occasional night sweats associated with taking her steroid. She denies any chest pain, shortness of breath, cough, or hemoptysis. She denies nausea, vomiting, or constipation. She occasionally has diarrhea in which she takes imodium. She is here today for evaluation prior to starting cycle #29.   MEDICAL HISTORY: Past Medical History:  Diagnosis Date  . Acid reflux   . Anxiety   . Asthma   . Cancer [facility])    waldenstroms/ macroglobinulemia  . Depression   . Depression   . Diabetes mellitus without complication ([place])   . Dysuria  02/28/2016  . Hypercholesteremia   . Hypertension   . Hypothyroidism   . Macroglobulinemia ([place])    ? POEMS syndrome  . Multiple myeloma ([place])   . Obesity   . PONV (postoperative nausea and vomiting)   . Sleep apnea    CPAP at bedtime  . Sleep apnea     ALLERGIES:  is allergic to codeine; hydrocodone; lortab [hydrocodone-acetaminophen]; onion; shellfish allergy; and amoxicillin.  MEDICATIONS:  Current Outpatient Medications  Medication Sig Dispense Refill  . acyclovir (ZOVIRAX) 400 MG tablet Take 1 tablet (400 mg total) by mouth 2 (two) times daily. 180 tablet 1  . ALPRAZolam (XANAX) 0.5 MG tablet Take 1 tablet (0.5 mg total) by mouth at bedtime as needed for anxiety. 30 tablet 0  . Azelastine HCl 0.15 % SOLN as needed.    . Blood Glucose Monitoring Suppl (ONE TOUCH ULTRA MINI) W/DEVICE KIT See admin instructions. Reported on 01/25/2015  0  . Cetirizine HCl (ZYRTEC ALLERGY PO) Take by mouth daily.    [name] dexamethasone (DECADRON) 4 MG tablet Take 1 tablet (4 mg total) by mouth once a week. Patient takes 5 tablets 1 day week prior to [facility] appointment. 40 tablet 1  . DiphenhydrAMINE HCl (BENADRYL ALLERGY PO) Take by mouth as needed.    [name] EPIPEN 2-PAK 0.3 MG/0.3ML SOAJ injection Reported on 06/21/2015  1  . Fexofenadine HCl (ALLEGRA ALLERGY PO) Take by mouth daily.    . Fluticasone-Salmeterol (ADVAIR) 100-50 MCG/DOSE AEPB Inhale 2 puffs into the lungs every 12 (twelve) hours.    [name] ibuprofen (ADVIL,MOTRIN) 100 MG tablet  Take 100 mg by mouth every 6 (six) hours as needed. Reported on 05/31/2015    . lenalidomide (REVLIMID) 25 MG capsule TAKE 1 CAPSULE BY MOUTH ONCE DAILY 21 DAYS ON AND 7 DAYS OFF 21 capsule 0  . levothyroxine (SYNTHROID, LEVOTHROID) 150 MCG tablet Take 150 mcg by mouth daily before breakfast.    . lisinopril (PRINIVIL,ZESTRIL) 10 MG tablet Take 10 mg by mouth daily. auth number 05/29/2018 [id number]  3  . loperamide (IMODIUM) 2 MG capsule Take 2 mg by mouth as needed for diarrhea  or loose stools.    . magic mouthwash w/lidocaine SOLN Take 5 mLs by mouth 4 (four) times daily as needed for mouth pain. Swish, Gargle, and spit (Patient not taking: Reported on 04/29/2018) 240 mL 1  . metFORMIN (GLUCOPHAGE-XR) 500 MG 24 hr tablet Take 500 mg by mouth daily.    [name] omeprazole (PRILOSEC) 40 MG capsule Take 40 mg by mouth 2 (two) times daily.     . ondansetron (ZOFRAN-ODT) 8 MG disintegrating tablet Take 1 tablet (8 mg total) by mouth every 8 (eight) hours as needed. Reported on 05/31/2015 20 tablet 2  . pravastatin (PRAVACHOL) 40 MG tablet Take 40 mg by mouth every evening.     [name] PROAIR HFA 108 (90 Base) MCG/ACT inhaler Reported on 06/21/2015  1  . sertraline (ZOLOFT) 50 MG tablet Take 3 tablets (150 mg total) by mouth daily. 270 tablet 2  . verapamil (CALAN-SR) 240 MG CR tablet Take 240 mg by mouth 2 (two) times daily.    [name] warfarin (COUMADIN) 2 MG tablet Take 1 tablet (2 mg total) by mouth daily. 90 tablet 0   No current facility-administered medications for this visit.     SURGICAL HISTORY:  Past Surgical History:  Procedure Laterality Date  . ABLATION  09/2007   HTA and polyp resection  . BACK SURGERY  03/2004   herniation, L4-L5  . Bil Laprascopic knee surgery    . BONE MARROW BIOPSY     2011  . BONE MARROW BIOPSY  4/14  . CHOLECYSTECTOMY    . FOOT SURGERY  1998  . KNEE ARTHROSCOPY W/ MENISCAL REPAIR  10/10 ; 3/11  . LAPAROSCOPIC CHOLECYSTECTOMY  1997  . NASAL SINUS SURGERY  2004  . UTERINE FIBROID EMBOLIZATION      REVIEW OF SYSTEMS:   Review of Systems  Constitutional: Positive for fatigue. Negative for appetite change, chills, fever and unexpected weight change.  HENT:   Negative for mouth sores, nosebleeds, sore throat and trouble swallowing.   Eyes: Negative for eye problems and icterus.  Respiratory: Negative for cough, hemoptysis, shortness of breath and wheezing.   Cardiovascular: Negative for chest pain and leg swelling.  Gastrointestinal: Positive for  diarrhea. Negative for abdominal pain, constipation,  nausea and vomiting.  Genitourinary: Positive for frequent vaginal yeast infections. Negative for bladder incontinence, difficulty urinating, dysuria, frequency and hematuria.   Musculoskeletal: Negative for back pain, gait problem, neck pain and neck stiffness.  Skin: Negative for itching and rash.  Neurological: Negative for dizziness, extremity weakness, gait problem, headaches, light-headedness and seizures.  Hematological: Negative for adenopathy. Does not bruise/bleed easily.  Psychiatric/Behavioral: Negative for confusion, depression and sleep disturbance. The patient is not nervous/anxious.     PHYSICAL EXAMINATION:  Blood pressure (!) 149/59, pulse 76, temperature 97.8 F (36.6 C), temperature source Temporal, resp. rate 18, height _0  (1.702 m), weight 246 lb 1.6 oz (111.6 kg), SpO2 100 %.  ECOG PERFORMANCE STATUS: 1 -  Symptomatic but completely ambulatory  Physical Exam  Constitutional: Oriented to person, place, and time and well-developed, well-nourished, and in no distress.  HENT:  Head: Normocephalic and atraumatic.  Mouth/Throat: Oropharynx is clear and moist. No oropharyngeal exudate.  Eyes: Conjunctivae are normal. Right eye exhibits no discharge. Left eye exhibits no discharge. No scleral icterus.  Neck: Normal range of motion. Neck supple.  Cardiovascular: Normal rate, regular rhythm, normal heart sounds and intact distal pulses.   Pulmonary/Chest: Effort normal and breath sounds normal. No respiratory distress. No wheezes. No rales.  Abdominal: Soft. Bowel sounds are normal. Exhibits no distension and no mass. There is no tenderness.  Musculoskeletal: Normal range of motion. Exhibits no edema.  Lymphadenopathy:    No cervical adenopathy.  Neurological: Alert and oriented to person, place, and time. Exhibits normal muscle tone. Gait normal. Coordination normal.  Skin: Skin is warm and dry. No rash noted. Not  diaphoretic. No erythema. No pallor.  Psychiatric: Mood, memory and judgment normal.  Vitals reviewed.  LABORATORY DATA: Lab Results  Component Value Date   WBC 3.3 (L) 07/01/2018   HGB 10.2 (L) 07/01/2018   HCT 32.0 (L) 07/01/2018   MCV 87.4 07/01/2018   PLT 124 (L) 07/01/2018      Chemistry      Component Value Date/Time   NA 138 06/24/2018 0814   NA 138 11/14/2016 0824   K 3.8 06/24/2018 0814   K 4.3 11/14/2016 0824   CL 103 06/24/2018 0814   CL 104 04/07/2012 1415   CO2 21 (L) 06/24/2018 0814   CO2 23 11/14/2016 0824   BUN 8 06/24/2018 0814   BUN 9.1 11/14/2016 0824   CREATININE 0.78 06/24/2018 0814   CREATININE 0.7 11/14/2016 0824      Component Value Date/Time   CALCIUM 8.9 06/24/2018 0814   CALCIUM 9.6 11/14/2016 0824   ALKPHOS 134 (H) 06/24/2018 0814   ALKPHOS 94 11/14/2016 0824   AST 36 06/24/2018 0814   AST 28 11/14/2016 0824   ALT 35 06/24/2018 0814   ALT 24 11/14/2016 0824   BILITOT 0.6 06/24/2018 0814   BILITOT 0.41 11/14/2016 0824       RADIOGRAPHIC STUDIES:  No results found.   ASSESSMENT/PLAN:  This is a very pleasant 58 year old caucasian female with multiple myeloma status post several chemotherapy regimens. The patient has been on treatment with subcutaneous weekly Velcade as well as Revlimid and Decadron. Status post 28 cycles.  The patient continues to tolerate this treatment well with no concerning adverse effects except for fatigue. I recommended for her to proceed with cycle #29 today as scheduled. I will see her back for follow-up visit in 4 weeks for evaluation and routine blood work.  For hypertension she was advised to take her blood pressure medication as prescribed and to reconsult with her primary care physician for adjustment of her medication if needed. She will continue taking imodium as needed for diarrhea.  She was advised to call immediately if she has any concerning symptoms in the interval. All questions were answered.  The patient knows to call the clinic with any problems, questions or concerns. We can certainly see the patient much sooner if necessary.  Orders Placed This Encounter  Procedures  . CBC with Differential (Cancer Center Only)    Standing Status:   Standing    Number of Occurrences:   4    Standing Expiration Date:   07/01/2019  . CMP ([facility] only)    Standing  Status:   Standing    Number of Occurrences:   4    Standing Expiration Date:   07/01/2019     Tobe Sos Reeve Mallo, PA-C 07/01/18  ADDENDUM: Hematology/Oncology Attending: I had a face-to-face encounter with the patient today.  I recommended her care plan.  This is a very pleasant 58 years old white female with multiple myeloma status post several chemotherapy regimens and she is currently on treatment with subcutaneous Velcade as well as Revlimid and Decadron status post 28 cycles.  The patient continues to tolerate this treatment well with no concerning adverse effects except for mild fatigue. I recommended for her to continue current treatment with the same regimen. For hypertension she was advised to take her blood pressure medication as prescribed and to monitor it closely at home. We will see the patient back for follow-up visit in 4 weeks for evaluation and repeat blood work. She was advised to call immediately if she has any concerning symptoms in the interval.  Disclaimer: This note was dictated with voice recognition software. Similar sounding words can inadvertently be transcribed and may be missed upon review. Eilleen Kempf, MD 07/01/18

## 2018-07-03 DIAGNOSIS — D225 Melanocytic nevi of trunk: Secondary | ICD-10-CM | POA: Diagnosis not present

## 2018-07-03 DIAGNOSIS — D1801 Hemangioma of skin and subcutaneous tissue: Secondary | ICD-10-CM | POA: Diagnosis not present

## 2018-07-03 DIAGNOSIS — L814 Other melanin hyperpigmentation: Secondary | ICD-10-CM | POA: Diagnosis not present

## 2018-07-03 DIAGNOSIS — D2262 Melanocytic nevi of left upper limb, including shoulder: Secondary | ICD-10-CM | POA: Diagnosis not present

## 2018-07-08 ENCOUNTER — Inpatient Hospital Stay: Payer: BC Managed Care – PPO

## 2018-07-08 ENCOUNTER — Other Ambulatory Visit: Payer: Self-pay

## 2018-07-08 ENCOUNTER — Other Ambulatory Visit: Payer: Self-pay | Admitting: Internal Medicine

## 2018-07-08 VITALS — BP 132/63 | HR 61 | Temp 98.3°F | Resp 18

## 2018-07-08 DIAGNOSIS — I10 Essential (primary) hypertension: Secondary | ICD-10-CM | POA: Diagnosis not present

## 2018-07-08 DIAGNOSIS — C9000 Multiple myeloma not having achieved remission: Secondary | ICD-10-CM

## 2018-07-08 DIAGNOSIS — Z79899 Other long term (current) drug therapy: Secondary | ICD-10-CM | POA: Diagnosis not present

## 2018-07-08 DIAGNOSIS — Z9221 Personal history of antineoplastic chemotherapy: Secondary | ICD-10-CM | POA: Diagnosis not present

## 2018-07-08 DIAGNOSIS — Z5112 Encounter for antineoplastic immunotherapy: Secondary | ICD-10-CM | POA: Diagnosis not present

## 2018-07-08 LAB — CBC WITH DIFFERENTIAL (CANCER CENTER ONLY)
Abs Immature Granulocytes: 0 10*3/uL (ref 0.00–0.07)
Basophils Absolute: 0.1 10*3/uL (ref 0.0–0.1)
Basophils Relative: 2 %
Eosinophils Absolute: 0 10*3/uL (ref 0.0–0.5)
Eosinophils Relative: 1 %
HCT: 32 % — ABNORMAL LOW (ref 36.0–46.0)
Hemoglobin: 9.9 g/dL — ABNORMAL LOW (ref 12.0–15.0)
Immature Granulocytes: 0 %
Lymphocytes Relative: 24 %
Lymphs Abs: 0.7 10*3/uL (ref 0.7–4.0)
MCH: 27.3 pg (ref 26.0–34.0)
MCHC: 30.9 g/dL (ref 30.0–36.0)
MCV: 88.2 fL (ref 80.0–100.0)
Monocytes Absolute: 0.3 10*3/uL (ref 0.1–1.0)
Monocytes Relative: 10 %
Neutro Abs: 1.8 10*3/uL (ref 1.7–7.7)
Neutrophils Relative %: 63 %
Platelet Count: 121 10*3/uL — ABNORMAL LOW (ref 150–400)
RBC: 3.63 MIL/uL — ABNORMAL LOW (ref 3.87–5.11)
RDW: 16 % — ABNORMAL HIGH (ref 11.5–15.5)
WBC Count: 2.8 10*3/uL — ABNORMAL LOW (ref 4.0–10.5)
nRBC: 0 % (ref 0.0–0.2)

## 2018-07-08 LAB — CMP (CANCER CENTER ONLY)
ALT: 35 U/L (ref 0–44)
AST: 38 U/L (ref 15–41)
Albumin: 3.8 g/dL (ref 3.5–5.0)
Alkaline Phosphatase: 128 U/L — ABNORMAL HIGH (ref 38–126)
Anion gap: 13 (ref 5–15)
BUN: 10 mg/dL (ref 6–20)
CO2: 22 mmol/L (ref 22–32)
Calcium: 9 mg/dL (ref 8.9–10.3)
Chloride: 103 mmol/L (ref 98–111)
Creatinine: 0.82 mg/dL (ref 0.44–1.00)
GFR, Est AFR Am: >60 mL/min (ref >60)
GFR, Estimated: >60 mL/min (ref >60)
Glucose, Bld: 181 mg/dL — ABNORMAL HIGH (ref 70–99)
Potassium: 4.2 mmol/L (ref 3.5–5.1)
Sodium: 138 mmol/L (ref 135–145)
Total Bilirubin: 0.6 mg/dL (ref 0.3–1.2)
Total Protein: 6.9 g/dL (ref 6.5–8.1)

## 2018-07-08 MED ORDER — ONDANSETRON HCL 8 MG PO TABS
ORAL_TABLET | ORAL | Status: AC
  Filled 2018-07-08: qty 1

## 2018-07-08 MED ORDER — ONDANSETRON HCL 8 MG PO TABS
8.0000 mg | ORAL_TABLET | Freq: Once | ORAL | Status: AC
  Administered 2018-07-08: 8 mg via ORAL

## 2018-07-08 MED ORDER — BORTEZOMIB CHEMO SQ INJECTION 3.5 MG (2.5MG/ML)
1.3000 mg/m2 | Freq: Once | INTRAMUSCULAR | Status: AC
  Administered 2018-07-08: 3 mg via SUBCUTANEOUS
  Filled 2018-07-08: qty 1.2

## 2018-07-08 NOTE — Patient Instructions (Signed)
Coronavirus (COVID-19) Are you at risk?  Are you at risk for the Coronavirus (COVID-19)?  To be considered HIGH RISK for Coronavirus (COVID-19), you have to meet the following criteria:  . Traveled to [place], [place], [place], [place] or [place]; or in the [place] to [place], [place], [place], or [place]; and have fever, cough, and shortness of breath within the last 2 weeks of travel OR . Been in close contact with a person diagnosed with COVID-19 within the last 2 weeks and have fever, cough, and shortness of breath . IF YOU DO NOT MEET THESE CRITERIA, YOU ARE CONSIDERED LOW RISK FOR COVID-19.  What to do if you are HIGH RISK for COVID-19?  . If you are having a medical emergency, call 911. . Seek medical care right away. Before you go to a doctor's office, urgent care or emergency department, call ahead and tell them about your recent travel, contact with someone diagnosed with COVID-19, and your symptoms. You should receive instructions from your physician's office regarding next steps of care.  . When you arrive at healthcare provider, tell the healthcare staff immediately you have returned from visiting [place], [place], [place], [place] or [place]; or traveled in the [place] to [place], [place], [place], or [place]; in the last two weeks or you have been in close contact with a person diagnosed with COVID-19 in the last 2 weeks.   . Tell the health care staff about your symptoms: fever, cough and shortness of breath. . After you have been seen by a medical provider, you will be either: o Tested for (COVID-19) and discharged home on quarantine except to seek medical care if symptoms worsen, and asked to  - Stay home and avoid contact with others until you get your results (4-5 days)  - Avoid travel on public transportation if possible (such as bus, train, or airplane) or o Sent to the Emergency Department by EMS for evaluation, COVID-19 testing, and possible  admission depending on your condition and test results.  What to do if you are LOW RISK for COVID-19?  Reduce your risk of any infection by using the same precautions used for avoiding the common cold or flu:  . Wash your hands often with soap and warm water for at least 20 seconds.  If soap and water are not readily available, use an alcohol-based hand sanitizer with at least 60% alcohol.  . If coughing or sneezing, cover your mouth and nose by coughing or sneezing into the elbow areas of your shirt or coat, into a tissue or into your sleeve (not your hands). . Avoid shaking hands with others and consider head nods or verbal greetings only. . Avoid touching your eyes, nose, or mouth with unwashed hands.  . Avoid close contact with people who are sick. . Avoid places or events with large numbers of people in one location, like concerts or sporting events. . Carefully consider travel plans you have or are making. . If you are planning any travel outside or inside the [place], visit the CDC's Travelers' Health webpage for the latest health notices. . If you have some symptoms but not all symptoms, continue to monitor at home and seek medical attention if your symptoms worsen. . If you are having a medical emergency, call 911.   ADDITIONAL HEALTHCARE OPTIONS FOR PATIENTS  [facility] / e-Visit: [email or web address]         [facility]: [phone]  [facility]: [phone]                   [facility]: [phone]    [facility] Discharge Instructions for Patients Receiving Chemotherapy  Today you received the following chemotherapy agents Velcade  To help prevent nausea and vomiting after your treatment, we encourage you to take your nausea medication as directed   If you develop nausea and vomiting that is not controlled by your nausea medication, call the clinic.   BELOW ARE  SYMPTOMS THAT SHOULD BE REPORTED IMMEDIATELY:  *FEVER GREATER THAN 100.5 F  *CHILLS WITH OR WITHOUT FEVER  NAUSEA AND VOMITING THAT IS NOT CONTROLLED WITH YOUR NAUSEA MEDICATION  *UNUSUAL SHORTNESS OF BREATH  *UNUSUAL BRUISING OR BLEEDING  TENDERNESS IN MOUTH AND THROAT WITH OR WITHOUT PRESENCE OF ULCERS  *URINARY PROBLEMS  *BOWEL PROBLEMS  UNUSUAL RASH Items with * indicate a potential emergency and should be followed up as soon as possible.  Feel free to call the clinic should you have any questions or concerns. The clinic phone number is [phone].  Please show the CHEMO ALERT CARD at check-in to the Emergency Department and triage nurse.   

## 2018-07-15 ENCOUNTER — Inpatient Hospital Stay: Payer: BC Managed Care – PPO | Attending: Internal Medicine

## 2018-07-15 ENCOUNTER — Other Ambulatory Visit: Payer: Self-pay

## 2018-07-15 ENCOUNTER — Inpatient Hospital Stay: Payer: BC Managed Care – PPO

## 2018-07-15 VITALS — BP 108/61 | HR 71 | Temp 98.2°F | Resp 18

## 2018-07-15 DIAGNOSIS — Z7984 Long term (current) use of oral hypoglycemic drugs: Secondary | ICD-10-CM | POA: Insufficient documentation

## 2018-07-15 DIAGNOSIS — Z791 Long term (current) use of non-steroidal anti-inflammatories (NSAID): Secondary | ICD-10-CM | POA: Insufficient documentation

## 2018-07-15 DIAGNOSIS — E119 Type 2 diabetes mellitus without complications: Secondary | ICD-10-CM | POA: Diagnosis not present

## 2018-07-15 DIAGNOSIS — E7800 Pure hypercholesterolemia, unspecified: Secondary | ICD-10-CM | POA: Insufficient documentation

## 2018-07-15 DIAGNOSIS — G4730 Sleep apnea, unspecified: Secondary | ICD-10-CM | POA: Diagnosis not present

## 2018-07-15 DIAGNOSIS — C9000 Multiple myeloma not having achieved remission: Secondary | ICD-10-CM | POA: Diagnosis not present

## 2018-07-15 DIAGNOSIS — Z5112 Encounter for antineoplastic immunotherapy: Secondary | ICD-10-CM | POA: Diagnosis not present

## 2018-07-15 DIAGNOSIS — Z7901 Long term (current) use of anticoagulants: Secondary | ICD-10-CM | POA: Insufficient documentation

## 2018-07-15 DIAGNOSIS — J45909 Unspecified asthma, uncomplicated: Secondary | ICD-10-CM | POA: Insufficient documentation

## 2018-07-15 DIAGNOSIS — F419 Anxiety disorder, unspecified: Secondary | ICD-10-CM | POA: Diagnosis not present

## 2018-07-15 DIAGNOSIS — F329 Major depressive disorder, single episode, unspecified: Secondary | ICD-10-CM | POA: Diagnosis not present

## 2018-07-15 DIAGNOSIS — Z7951 Long term (current) use of inhaled steroids: Secondary | ICD-10-CM | POA: Insufficient documentation

## 2018-07-15 DIAGNOSIS — E039 Hypothyroidism, unspecified: Secondary | ICD-10-CM | POA: Insufficient documentation

## 2018-07-15 DIAGNOSIS — I10 Essential (primary) hypertension: Secondary | ICD-10-CM | POA: Diagnosis not present

## 2018-07-15 DIAGNOSIS — Z79899 Other long term (current) drug therapy: Secondary | ICD-10-CM | POA: Insufficient documentation

## 2018-07-15 LAB — CBC WITH DIFFERENTIAL (CANCER CENTER ONLY)
Abs Immature Granulocytes: 0.02 10*3/uL (ref 0.00–0.07)
Basophils Absolute: 0.1 10*3/uL (ref 0.0–0.1)
Basophils Relative: 2 %
Eosinophils Absolute: 0.1 10*3/uL (ref 0.0–0.5)
Eosinophils Relative: 2 %
HCT: 32.6 % — ABNORMAL LOW (ref 36.0–46.0)
Hemoglobin: 10.2 g/dL — ABNORMAL LOW (ref 12.0–15.0)
Immature Granulocytes: 1 %
Lymphocytes Relative: 16 %
Lymphs Abs: 0.6 10*3/uL — ABNORMAL LOW (ref 0.7–4.0)
MCH: 27.4 pg (ref 26.0–34.0)
MCHC: 31.3 g/dL (ref 30.0–36.0)
MCV: 87.6 fL (ref 80.0–100.0)
Monocytes Absolute: 0.2 10*3/uL (ref 0.1–1.0)
Monocytes Relative: 5 %
Neutro Abs: 2.9 10*3/uL (ref 1.7–7.7)
Neutrophils Relative %: 74 %
Platelet Count: 144 10*3/uL — ABNORMAL LOW (ref 150–400)
RBC: 3.72 MIL/uL — ABNORMAL LOW (ref 3.87–5.11)
RDW: 16 % — ABNORMAL HIGH (ref 11.5–15.5)
WBC Count: 3.9 10*3/uL — ABNORMAL LOW (ref 4.0–10.5)
nRBC: 0 % (ref 0.0–0.2)

## 2018-07-15 LAB — CMP (CANCER CENTER ONLY)
ALT: 34 U/L (ref 0–44)
AST: 42 U/L — ABNORMAL HIGH (ref 15–41)
Albumin: 3.8 g/dL (ref 3.5–5.0)
Alkaline Phosphatase: 135 U/L — ABNORMAL HIGH (ref 38–126)
Anion gap: 15 (ref 5–15)
BUN: 9 mg/dL (ref 6–20)
CO2: 19 mmol/L — ABNORMAL LOW (ref 22–32)
Calcium: 8.9 mg/dL (ref 8.9–10.3)
Chloride: 102 mmol/L (ref 98–111)
Creatinine: 0.82 mg/dL (ref 0.44–1.00)
GFR, Est AFR Am: >60 mL/min (ref >60)
GFR, Estimated: >60 mL/min (ref >60)
Glucose, Bld: 233 mg/dL — ABNORMAL HIGH (ref 70–99)
Potassium: 3.6 mmol/L (ref 3.5–5.1)
Sodium: 136 mmol/L (ref 135–145)
Total Bilirubin: 0.5 mg/dL (ref 0.3–1.2)
Total Protein: 7.1 g/dL (ref 6.5–8.1)

## 2018-07-15 MED ORDER — BORTEZOMIB CHEMO SQ INJECTION 3.5 MG (2.5MG/ML)
1.3000 mg/m2 | Freq: Once | INTRAMUSCULAR | Status: AC
  Administered 2018-07-15: 3 mg via SUBCUTANEOUS
  Filled 2018-07-15: qty 1.2

## 2018-07-15 MED ORDER — ONDANSETRON HCL 8 MG PO TABS
8.0000 mg | ORAL_TABLET | Freq: Once | ORAL | Status: AC
  Administered 2018-07-15: 8 mg via ORAL

## 2018-07-15 MED ORDER — ONDANSETRON HCL 8 MG PO TABS
ORAL_TABLET | ORAL | Status: AC
  Filled 2018-07-15: qty 1

## 2018-07-15 NOTE — Patient Instructions (Signed)
[facility] Discharge Instructions for Patients Receiving Chemotherapy  Today you received the following chemotherapy agents Velcade.  To help prevent nausea and vomiting after your treatment, we encourage you to take your nausea medication as directed.  If you develop nausea and vomiting that is not controlled by your nausea medication, call the clinic.   BELOW ARE SYMPTOMS THAT SHOULD BE REPORTED IMMEDIATELY:  *FEVER GREATER THAN 100.5 F  *CHILLS WITH OR WITHOUT FEVER  NAUSEA AND VOMITING THAT IS NOT CONTROLLED WITH YOUR NAUSEA MEDICATION  *UNUSUAL SHORTNESS OF BREATH  *UNUSUAL BRUISING OR BLEEDING  TENDERNESS IN MOUTH AND THROAT WITH OR WITHOUT PRESENCE OF ULCERS  *URINARY PROBLEMS  *BOWEL PROBLEMS  UNUSUAL RASH Items with * indicate a potential emergency and should be followed up as soon as possible.  Feel free to call the clinic should you have any questions or concerns. The clinic phone number is [phone].  Please show the CHEMO ALERT CARD at check-in to the Emergency Department and triage nurse.   

## 2018-07-21 ENCOUNTER — Other Ambulatory Visit: Payer: Self-pay | Admitting: Internal Medicine

## 2018-07-21 DIAGNOSIS — C9000 Multiple myeloma not having achieved remission: Secondary | ICD-10-CM

## 2018-07-22 ENCOUNTER — Inpatient Hospital Stay: Payer: BC Managed Care – PPO

## 2018-07-22 ENCOUNTER — Other Ambulatory Visit: Payer: Self-pay

## 2018-07-22 VITALS — BP 128/68 | HR 66 | Temp 98.2°F | Resp 18 | Wt 243.8 lb

## 2018-07-22 DIAGNOSIS — Z7901 Long term (current) use of anticoagulants: Secondary | ICD-10-CM | POA: Diagnosis not present

## 2018-07-22 DIAGNOSIS — Z7984 Long term (current) use of oral hypoglycemic drugs: Secondary | ICD-10-CM | POA: Diagnosis not present

## 2018-07-22 DIAGNOSIS — C9000 Multiple myeloma not having achieved remission: Secondary | ICD-10-CM

## 2018-07-22 DIAGNOSIS — Z7951 Long term (current) use of inhaled steroids: Secondary | ICD-10-CM | POA: Diagnosis not present

## 2018-07-22 DIAGNOSIS — G4730 Sleep apnea, unspecified: Secondary | ICD-10-CM | POA: Diagnosis not present

## 2018-07-22 DIAGNOSIS — F419 Anxiety disorder, unspecified: Secondary | ICD-10-CM | POA: Diagnosis not present

## 2018-07-22 DIAGNOSIS — E119 Type 2 diabetes mellitus without complications: Secondary | ICD-10-CM | POA: Diagnosis not present

## 2018-07-22 DIAGNOSIS — F329 Major depressive disorder, single episode, unspecified: Secondary | ICD-10-CM | POA: Diagnosis not present

## 2018-07-22 DIAGNOSIS — Z79899 Other long term (current) drug therapy: Secondary | ICD-10-CM | POA: Diagnosis not present

## 2018-07-22 DIAGNOSIS — E039 Hypothyroidism, unspecified: Secondary | ICD-10-CM | POA: Diagnosis not present

## 2018-07-22 DIAGNOSIS — Z5112 Encounter for antineoplastic immunotherapy: Secondary | ICD-10-CM | POA: Diagnosis not present

## 2018-07-22 DIAGNOSIS — I10 Essential (primary) hypertension: Secondary | ICD-10-CM | POA: Diagnosis not present

## 2018-07-22 DIAGNOSIS — E7800 Pure hypercholesterolemia, unspecified: Secondary | ICD-10-CM | POA: Diagnosis not present

## 2018-07-22 DIAGNOSIS — J45909 Unspecified asthma, uncomplicated: Secondary | ICD-10-CM | POA: Diagnosis not present

## 2018-07-22 DIAGNOSIS — Z791 Long term (current) use of non-steroidal anti-inflammatories (NSAID): Secondary | ICD-10-CM | POA: Diagnosis not present

## 2018-07-22 LAB — CMP (CANCER CENTER ONLY)
ALT: 38 U/L (ref 0–44)
AST: 42 U/L — ABNORMAL HIGH (ref 15–41)
Albumin: 3.8 g/dL (ref 3.5–5.0)
Alkaline Phosphatase: 130 U/L — ABNORMAL HIGH (ref 38–126)
Anion gap: 12 (ref 5–15)
BUN: 10 mg/dL (ref 6–20)
CO2: 23 mmol/L (ref 22–32)
Calcium: 9 mg/dL (ref 8.9–10.3)
Chloride: 103 mmol/L (ref 98–111)
Creatinine: 0.77 mg/dL (ref 0.44–1.00)
GFR, Est AFR Am: >60 mL/min (ref >60)
GFR, Estimated: >60 mL/min (ref >60)
Glucose, Bld: 182 mg/dL — ABNORMAL HIGH (ref 70–99)
Potassium: 3.9 mmol/L (ref 3.5–5.1)
Sodium: 138 mmol/L (ref 135–145)
Total Bilirubin: 0.5 mg/dL (ref 0.3–1.2)
Total Protein: 6.8 g/dL (ref 6.5–8.1)

## 2018-07-22 LAB — CBC WITH DIFFERENTIAL (CANCER CENTER ONLY)
Abs Immature Granulocytes: 0.02 10*3/uL (ref 0.00–0.07)
Basophils Absolute: 0 10*3/uL (ref 0.0–0.1)
Basophils Relative: 1 %
Eosinophils Absolute: 0.1 10*3/uL (ref 0.0–0.5)
Eosinophils Relative: 3 %
HCT: 32.3 % — ABNORMAL LOW (ref 36.0–46.0)
Hemoglobin: 10.3 g/dL — ABNORMAL LOW (ref 12.0–15.0)
Immature Granulocytes: 1 %
Lymphocytes Relative: 18 %
Lymphs Abs: 0.6 10*3/uL — ABNORMAL LOW (ref 0.7–4.0)
MCH: 28.1 pg (ref 26.0–34.0)
MCHC: 31.9 g/dL (ref 30.0–36.0)
MCV: 88 fL (ref 80.0–100.0)
Monocytes Absolute: 0.4 10*3/uL (ref 0.1–1.0)
Monocytes Relative: 13 %
Neutro Abs: 2 10*3/uL (ref 1.7–7.7)
Neutrophils Relative %: 64 %
Platelet Count: 114 10*3/uL — ABNORMAL LOW (ref 150–400)
RBC: 3.67 MIL/uL — ABNORMAL LOW (ref 3.87–5.11)
RDW: 16.3 % — ABNORMAL HIGH (ref 11.5–15.5)
WBC Count: 3.1 10*3/uL — ABNORMAL LOW (ref 4.0–10.5)
nRBC: 0 % (ref 0.0–0.2)

## 2018-07-22 MED ORDER — ONDANSETRON HCL 8 MG PO TABS
8.0000 mg | ORAL_TABLET | Freq: Once | ORAL | Status: AC
  Administered 2018-07-22: 8 mg via ORAL

## 2018-07-22 MED ORDER — ONDANSETRON HCL 8 MG PO TABS
ORAL_TABLET | ORAL | Status: AC
  Filled 2018-07-22: qty 1

## 2018-07-22 MED ORDER — BORTEZOMIB CHEMO SQ INJECTION 3.5 MG (2.5MG/ML)
1.3000 mg/m2 | Freq: Once | INTRAMUSCULAR | Status: AC
  Administered 2018-07-22: 3 mg via SUBCUTANEOUS
  Filled 2018-07-22: qty 1.2

## 2018-07-22 NOTE — Patient Instructions (Signed)
[facility] Discharge Instructions for Patients Receiving Chemotherapy  Today you received the following chemotherapy agents Velcade.  To help prevent nausea and vomiting after your treatment, we encourage you to take your nausea medication as directed.  If you develop nausea and vomiting that is not controlled by your nausea medication, call the clinic.   BELOW ARE SYMPTOMS THAT SHOULD BE REPORTED IMMEDIATELY:  *FEVER GREATER THAN 100.5 F  *CHILLS WITH OR WITHOUT FEVER  NAUSEA AND VOMITING THAT IS NOT CONTROLLED WITH YOUR NAUSEA MEDICATION  *UNUSUAL SHORTNESS OF BREATH  *UNUSUAL BRUISING OR BLEEDING  TENDERNESS IN MOUTH AND THROAT WITH OR WITHOUT PRESENCE OF ULCERS  *URINARY PROBLEMS  *BOWEL PROBLEMS  UNUSUAL RASH Items with * indicate a potential emergency and should be followed up as soon as possible.  Feel free to call the clinic should you have any questions or concerns. The clinic phone number is [phone].  Please show the CHEMO ALERT CARD at check-in to the Emergency Department and triage nurse.   

## 2018-07-23 ENCOUNTER — Other Ambulatory Visit: Payer: Self-pay | Admitting: *Deleted

## 2018-07-23 DIAGNOSIS — C9000 Multiple myeloma not having achieved remission: Secondary | ICD-10-CM

## 2018-07-23 MED ORDER — LENALIDOMIDE 25 MG PO CAPS: ORAL_CAPSULE | ORAL | 0 refills | Status: DC

## 2018-07-23 NOTE — Telephone Encounter (Signed)
Revlimid Rx escribed to [facility]. Auth # [id number]

## 2018-07-26 ENCOUNTER — Other Ambulatory Visit: Payer: Self-pay | Admitting: Internal Medicine

## 2018-07-26 DIAGNOSIS — C9000 Multiple myeloma not having achieved remission: Secondary | ICD-10-CM

## 2018-07-29 ENCOUNTER — Telehealth: Payer: Self-pay | Admitting: Internal Medicine

## 2018-07-29 ENCOUNTER — Encounter: Payer: Self-pay | Admitting: Physician Assistant

## 2018-07-29 ENCOUNTER — Ambulatory Visit: Payer: BLUE CROSS/BLUE SHIELD

## 2018-07-29 ENCOUNTER — Inpatient Hospital Stay: Payer: BC Managed Care – PPO

## 2018-07-29 ENCOUNTER — Other Ambulatory Visit: Payer: Self-pay

## 2018-07-29 ENCOUNTER — Inpatient Hospital Stay (HOSPITAL_BASED_OUTPATIENT_CLINIC_OR_DEPARTMENT_OTHER): Payer: BC Managed Care – PPO | Admitting: Physician Assistant

## 2018-07-29 VITALS — BP 128/51 | HR 62 | Temp 98.5°F | Resp 17 | Ht 67.0 in | Wt 243.3 lb

## 2018-07-29 DIAGNOSIS — C9000 Multiple myeloma not having achieved remission: Secondary | ICD-10-CM

## 2018-07-29 DIAGNOSIS — J45909 Unspecified asthma, uncomplicated: Secondary | ICD-10-CM | POA: Diagnosis not present

## 2018-07-29 DIAGNOSIS — E039 Hypothyroidism, unspecified: Secondary | ICD-10-CM

## 2018-07-29 DIAGNOSIS — Z7901 Long term (current) use of anticoagulants: Secondary | ICD-10-CM

## 2018-07-29 DIAGNOSIS — E119 Type 2 diabetes mellitus without complications: Secondary | ICD-10-CM | POA: Diagnosis not present

## 2018-07-29 DIAGNOSIS — Z79899 Other long term (current) drug therapy: Secondary | ICD-10-CM | POA: Diagnosis not present

## 2018-07-29 DIAGNOSIS — Z7951 Long term (current) use of inhaled steroids: Secondary | ICD-10-CM

## 2018-07-29 DIAGNOSIS — I10 Essential (primary) hypertension: Secondary | ICD-10-CM

## 2018-07-29 DIAGNOSIS — Z791 Long term (current) use of non-steroidal anti-inflammatories (NSAID): Secondary | ICD-10-CM

## 2018-07-29 DIAGNOSIS — Z794 Long term (current) use of insulin: Secondary | ICD-10-CM

## 2018-07-29 DIAGNOSIS — G4730 Sleep apnea, unspecified: Secondary | ICD-10-CM | POA: Diagnosis not present

## 2018-07-29 DIAGNOSIS — Z5111 Encounter for antineoplastic chemotherapy: Secondary | ICD-10-CM

## 2018-07-29 DIAGNOSIS — Z5112 Encounter for antineoplastic immunotherapy: Secondary | ICD-10-CM | POA: Diagnosis not present

## 2018-07-29 DIAGNOSIS — F329 Major depressive disorder, single episode, unspecified: Secondary | ICD-10-CM

## 2018-07-29 DIAGNOSIS — Z7984 Long term (current) use of oral hypoglycemic drugs: Secondary | ICD-10-CM | POA: Diagnosis not present

## 2018-07-29 DIAGNOSIS — F419 Anxiety disorder, unspecified: Secondary | ICD-10-CM | POA: Diagnosis not present

## 2018-07-29 DIAGNOSIS — E7800 Pure hypercholesterolemia, unspecified: Secondary | ICD-10-CM

## 2018-07-29 LAB — CMP (CANCER CENTER ONLY)
ALT: 39 U/L (ref 0–44)
AST: 41 U/L (ref 15–41)
Albumin: 3.7 g/dL (ref 3.5–5.0)
Alkaline Phosphatase: 132 U/L — ABNORMAL HIGH (ref 38–126)
Anion gap: 13 (ref 5–15)
BUN: 7 mg/dL (ref 6–20)
CO2: 22 mmol/L (ref 22–32)
Calcium: 9 mg/dL (ref 8.9–10.3)
Chloride: 105 mmol/L (ref 98–111)
Creatinine: 0.77 mg/dL (ref 0.44–1.00)
GFR, Est AFR Am: >60 mL/min (ref >60)
GFR, Estimated: >60 mL/min (ref >60)
Glucose, Bld: 179 mg/dL — ABNORMAL HIGH (ref 70–99)
Potassium: 3.5 mmol/L (ref 3.5–5.1)
Sodium: 140 mmol/L (ref 135–145)
Total Bilirubin: 0.7 mg/dL (ref 0.3–1.2)
Total Protein: 6.6 g/dL (ref 6.5–8.1)

## 2018-07-29 LAB — CBC WITH DIFFERENTIAL (CANCER CENTER ONLY)
Abs Immature Granulocytes: 0.02 10*3/uL (ref 0.00–0.07)
Basophils Absolute: 0 10*3/uL (ref 0.0–0.1)
Basophils Relative: 1 %
Eosinophils Absolute: 0.1 10*3/uL (ref 0.0–0.5)
Eosinophils Relative: 4 %
HCT: 30.9 % — ABNORMAL LOW (ref 36.0–46.0)
Hemoglobin: 9.7 g/dL — ABNORMAL LOW (ref 12.0–15.0)
Immature Granulocytes: 1 %
Lymphocytes Relative: 17 %
Lymphs Abs: 0.4 10*3/uL — ABNORMAL LOW (ref 0.7–4.0)
MCH: 27.6 pg (ref 26.0–34.0)
MCHC: 31.4 g/dL (ref 30.0–36.0)
MCV: 88 fL (ref 80.0–100.0)
Monocytes Absolute: 0.3 10*3/uL (ref 0.1–1.0)
Monocytes Relative: 12 %
Neutro Abs: 1.6 10*3/uL — ABNORMAL LOW (ref 1.7–7.7)
Neutrophils Relative %: 65 %
Platelet Count: 105 10*3/uL — ABNORMAL LOW (ref 150–400)
RBC: 3.51 MIL/uL — ABNORMAL LOW (ref 3.87–5.11)
RDW: 16.6 % — ABNORMAL HIGH (ref 11.5–15.5)
WBC Count: 2.4 10*3/uL — ABNORMAL LOW (ref 4.0–10.5)
nRBC: 0 % (ref 0.0–0.2)

## 2018-07-29 MED ORDER — ONDANSETRON HCL 8 MG PO TABS
8.0000 mg | ORAL_TABLET | Freq: Once | ORAL | Status: AC
  Administered 2018-07-29: 8 mg via ORAL

## 2018-07-29 MED ORDER — BORTEZOMIB CHEMO SQ INJECTION 3.5 MG (2.5MG/ML)
1.3000 mg/m2 | Freq: Once | INTRAMUSCULAR | Status: AC
  Administered 2018-07-29: 10:00:00 3 mg via SUBCUTANEOUS
  Filled 2018-07-29: qty 1.2

## 2018-07-29 MED ORDER — ONDANSETRON HCL 8 MG PO TABS
ORAL_TABLET | ORAL | Status: AC
  Filled 2018-07-29: qty 1

## 2018-07-29 NOTE — Progress Notes (Signed)
Faxed completed [facility] clinical ordering form to [phone].

## 2018-07-29 NOTE — Patient Instructions (Signed)
[facility] Discharge Instructions for Patients Receiving Chemotherapy  Today you received the following chemotherapy agents Velcade.  To help prevent nausea and vomiting after your treatment, we encourage you to take your nausea medication as directed.  If you develop nausea and vomiting that is not controlled by your nausea medication, call the clinic.   BELOW ARE SYMPTOMS THAT SHOULD BE REPORTED IMMEDIATELY:  *FEVER GREATER THAN 100.5 F  *CHILLS WITH OR WITHOUT FEVER  NAUSEA AND VOMITING THAT IS NOT CONTROLLED WITH YOUR NAUSEA MEDICATION  *UNUSUAL SHORTNESS OF BREATH  *UNUSUAL BRUISING OR BLEEDING  TENDERNESS IN MOUTH AND THROAT WITH OR WITHOUT PRESENCE OF ULCERS  *URINARY PROBLEMS  *BOWEL PROBLEMS  UNUSUAL RASH Items with * indicate a potential emergency and should be followed up as soon as possible.  Feel free to call the clinic should you have any questions or concerns. The clinic phone number is [phone].  Please show the CHEMO ALERT CARD at check-in to the Emergency Department and triage nurse.   

## 2018-07-29 NOTE — Progress Notes (Signed)
[facility] OFFICE PROGRESS NOTE  Brenda Dickens, MD [place] [id number]  DIAGNOSIS: Plasma cell dyscrasia initially diagnosed as MGUS in September 2010, with additional symptoms suggestive of POEMS syndrome.   PRIOR THERAPY:  1) Velcade 1.3 MG/M2 subcutaneously with Decadron 40 mg by mouth on a weekly basis. First cycle 11/24/2013. She status post 31 weekly doses of treatment. 2) Velcade 1.3 MG/M2 subcutaneously and weekly basis with Decadron 40 mg by mouth weekly. First dose 02/01/2015. Status post 28 cycles. 3) Revlimid 25 mg by mouth daily for 21 days every 4 weeks with weekly Decadron 20 mg. started in 11/27/2015. Status post 3 cycles discontinued secondary to lack of response.  CURRENT THERAPY: Systemic treatment with Velcade 1.3 MG/KG weekly, Revlimid 25 mg by mouth daily for 21 days every 4 weeks in addition to Decadron 20 mg by mouth weekly. First dose 03/06/2016. Status post 29cycles.  INTERVAL HISTORY: Brenda Conner 58 y.o. female returns to the clinic for a follow up visit. The patient is feeling well today without any concerning complaints except for mild fatigue. She is tolerating her treatment well without any adverse effects except for tingling in her hands and feet. She denies any pain and states that the tingling does not interfere with her ability to perform daily tasks. She denies any fevers, chills, lymphadenopathy, or weight loss. She gets "hot" at night due to taking decadron.  She denies any chest pain, shortness of breath, cough, or hemoptysis. She denies any vomiting or constipation. She occasionally gets nausea for which she takes Zofran and she reports occasional diarrhea which is controlled with imodium. She also drinks plenty of water to stay hydrated. Today is her off week with Revlimid. She is scheduled to start her next 21 day cycle on Tuesday July 27th, 2020. She is here for evaluation before starting cycle #30.   MEDICAL  HISTORY: Past Medical History:  Diagnosis Date  . Acid reflux   . Anxiety   . Asthma   . Cancer [facility])    waldenstroms/ macroglobinulemia  . Depression   . Depression   . Diabetes mellitus without complication ([place])   . Dysuria 02/28/2016  . Hypercholesteremia   . Hypertension   . Hypothyroidism   . Macroglobulinemia ([place])    ? POEMS syndrome  . Multiple myeloma ([place])   . Obesity   . PONV (postoperative nausea and vomiting)   . Sleep apnea    CPAP at bedtime  . Sleep apnea     ALLERGIES:  is allergic to codeine; hydrocodone; lortab [hydrocodone-acetaminophen]; onion; shellfish allergy; and amoxicillin.  MEDICATIONS:  Current Outpatient Medications  Medication Sig Dispense Refill  . acyclovir (ZOVIRAX) 400 MG tablet Take 1 tablet (400 mg total) by mouth 2 (two) times daily. 180 tablet 1  . ALPRAZolam (XANAX) 0.5 MG tablet Take 1 tablet (0.5 mg total) by mouth at bedtime as needed for anxiety. 30 tablet 0  . Azelastine HCl 0.15 % SOLN as needed.    . Blood Glucose Monitoring Suppl (ONE TOUCH ULTRA MINI) W/DEVICE KIT See admin instructions. Reported on 01/25/2015  0  . Cetirizine HCl (ZYRTEC ALLERGY PO) Take by mouth daily.    [name] dexamethasone (DECADRON) 4 MG tablet TAKE 1 TABLET (4 MG TOTAL) BY MOUTH ONCE A WEEK. PATIENT TAKES 5 TABLETS 1 DAY WEEK PRIOR TO VELCADE APPOINTMENT. 40 tablet 1  . DiphenhydrAMINE HCl (BENADRYL ALLERGY PO) Take by mouth as needed.    [name] EPIPEN  2-PAK 0.3 MG/0.3ML SOAJ injection Reported on 06/21/2015  1  . Fexofenadine HCl (ALLEGRA ALLERGY PO) Take by mouth daily.    . Fluticasone-Salmeterol (ADVAIR) 100-50 MCG/DOSE AEPB Inhale 2 puffs into the lungs every 12 (twelve) hours.    [name] ibuprofen (ADVIL,MOTRIN) 100 MG tablet Take 100 mg by mouth every 6 (six) hours as needed. Reported on 05/31/2015    . lenalidomide (REVLIMID) 25 MG capsule TAKE 1 CAPSULE BY MOUTH ONCE DAILY 21 DAYS ON AND 7 DAYS OFF 21 capsule 0  . levothyroxine (SYNTHROID, LEVOTHROID) 150 MCG  tablet Take 150 mcg by mouth daily before breakfast.    . lisinopril (PRINIVIL,ZESTRIL) 10 MG tablet Take 10 mg by mouth daily. auth number 05/29/2018 [id number]  3  . loperamide (IMODIUM) 2 MG capsule Take 2 mg by mouth as needed for diarrhea or loose stools.    . magic mouthwash w/lidocaine SOLN Take 5 mLs by mouth 4 (four) times daily as needed for mouth pain. Swish, Gargle, and spit 240 mL 1  . metFORMIN (GLUCOPHAGE-XR) 500 MG 24 hr tablet Take 500 mg by mouth daily.    [name] omeprazole (PRILOSEC) 40 MG capsule Take 40 mg by mouth 2 (two) times daily.     . ondansetron (ZOFRAN-ODT) 8 MG disintegrating tablet Take 1 tablet (8 mg total) by mouth every 8 (eight) hours as needed. Reported on 05/31/2015 20 tablet 2  . pravastatin (PRAVACHOL) 40 MG tablet Take 40 mg by mouth every evening.     [name] PROAIR HFA 108 (90 Base) MCG/ACT inhaler Reported on 06/21/2015  1  . sertraline (ZOLOFT) 50 MG tablet Take 3 tablets (150 mg total) by mouth daily. 270 tablet 2  . verapamil (CALAN-SR) 240 MG CR tablet Take 240 mg by mouth 2 (two) times daily.    [name] warfarin (COUMADIN) 2 MG tablet TAKE 1 TABLET BY MOUTH EVERY DAY 90 tablet 0   No current facility-administered medications for this visit.    Facility-Administered Medications Ordered in Other Visits  Medication Dose Route Frequency Provider Last Rate Last Dose  . bortezomib SQ (VELCADE) chemo injection 3 mg  1.3 mg/m2 (Treatment Plan Recorded) Subcutaneous Once Brenda Bears, MD      . ondansetron [facility]) tablet 8 mg  8 mg Oral Once Brenda Bears, MD        SURGICAL HISTORY:  Past Surgical History:  Procedure Laterality Date  . ABLATION  09/2007   HTA and polyp resection  . BACK SURGERY  03/2004   herniation, L4-L5  . Bil Laprascopic knee surgery    . BONE MARROW BIOPSY     2011  . BONE MARROW BIOPSY  4/14  . CHOLECYSTECTOMY    . FOOT SURGERY  1998  . KNEE ARTHROSCOPY W/ MENISCAL REPAIR  10/10 ; 3/11  . LAPAROSCOPIC CHOLECYSTECTOMY  1997  . NASAL  SINUS SURGERY  2004  . UTERINE FIBROID EMBOLIZATION      REVIEW OF SYSTEMS:   Review of Systems  Constitutional: Positive for fatigue. Negative for appetite change, chills, fever and unexpected weight change.  HENT: Negative for mouth sores, nosebleeds, sore throat and trouble swallowing.   Eyes: Negative for eye problems and icterus.  Respiratory: Negative for cough, hemoptysis, shortness of breath and wheezing.   Cardiovascular: Negative for chest pain and leg swelling.  Gastrointestinal: Positive for occasional nausea and diarrhea. Negative for abdominal pain, constipation, and vomiting.  Genitourinary: Negative for bladder incontinence, difficulty urinating, dysuria, frequency and hematuria.   Musculoskeletal: Negative for back pain,  gait problem, neck pain and neck stiffness.  Skin: Negative for itching and rash.  Neurological: Negative for dizziness, extremity weakness, gait problem, headaches, light-headedness and seizures.  Hematological: Negative for adenopathy. Does not bruise/bleed easily.  Psychiatric/Behavioral: Negative for confusion, depression and sleep disturbance. The patient is not nervous/anxious.     PHYSICAL EXAMINATION:  Blood pressure (!) 128/51, pulse 62, temperature 98.5 F (36.9 C), temperature source Oral, resp. rate 17, height '5\' 7"'$  (1.702 m), weight 243 lb 4.8 oz (110.4 kg), SpO2 100 %.  ECOG PERFORMANCE STATUS: 1 - Symptomatic but completely ambulatory  Physical Exam  Constitutional: Oriented to person, place, and time and well-developed, well-nourished, and in no distress.  HENT:  Head: Normocephalic and atraumatic.  Mouth/Throat: Oropharynx is clear and moist. No oropharyngeal exudate.  Eyes: Conjunctivae are normal. Right eye exhibits no discharge. Left eye exhibits no discharge. No scleral icterus.  Neck: Normal range of motion. Neck supple.  Cardiovascular: Normal rate, regular rhythm, normal heart sounds and intact distal pulses.    Pulmonary/Chest: Effort normal and breath sounds normal. No respiratory distress. No wheezes. No rales.  Abdominal: Soft. Bowel sounds are normal. Exhibits no distension and no mass. There is no tenderness.  Musculoskeletal: Normal range of motion. Exhibits no edema.  Lymphadenopathy:    No cervical adenopathy.  Neurological: Alert and oriented to person, place, and time. Exhibits normal muscle tone. Gait normal. Coordination normal.  Skin: Skin is warm and dry. No rash noted. Not diaphoretic. No erythema. No pallor.  Psychiatric: Mood, memory and judgment normal.  Vitals reviewed.  LABORATORY DATA: Lab Results  Component Value Date   WBC 2.4 (L) 07/29/2018   HGB 9.7 (L) 07/29/2018   HCT 30.9 (L) 07/29/2018   MCV 88.0 07/29/2018   PLT 105 (L) 07/29/2018      Chemistry      Component Value Date/Time   NA 140 07/29/2018 0833   NA 138 11/14/2016 0824   K 3.5 07/29/2018 0833   K 4.3 11/14/2016 0824   CL 105 07/29/2018 0833   CL 104 04/07/2012 1415   CO2 22 07/29/2018 0833   CO2 23 11/14/2016 0824   BUN 7 07/29/2018 0833   BUN 9.1 11/14/2016 0824   CREATININE 0.77 07/29/2018 0833   CREATININE 0.7 11/14/2016 0824      Component Value Date/Time   CALCIUM 9.0 07/29/2018 0833   CALCIUM 9.6 11/14/2016 0824   ALKPHOS 132 (H) 07/29/2018 0833   ALKPHOS 94 11/14/2016 0824   AST 41 07/29/2018 0833   AST 28 11/14/2016 0824   ALT 39 07/29/2018 0833   ALT 24 11/14/2016 0824   BILITOT 0.7 07/29/2018 0833   BILITOT 0.41 11/14/2016 0824       RADIOGRAPHIC STUDIES:  No results found.   ASSESSMENT/PLAN:  This is a very pleasant 58 year old Caucasian female with multiple myeloma status post several chemotherapy regimens. The patient has been on treatment with subcutaneous weekly Velcade as well as Revlimid and Decadron. Status post 29 cycles.  The patient continues to tolerate this treatment well with no concerning adverse effects except for fatigue. The patient was seen with  Brenda Conner today. Labs were reviewed. We recommended for her to proceed with cycle #30 today as scheduled. I will see her back for follow-up visit in 4 weeks for evaluation and routine blood work. I will arrange for her to have a myeloma panel performed before her next visit in 4 weeks.  She will continue taking imodium as needed for  diarrhea. She will continue taking zofran for nausea.  The patient inquired about participating in a genetic study for multiple myeloma. We will assist her in faxing her participation form. She was advised to call immediately if she has any concerning symptoms in the interval. All questions were answered. The patient knows to call the clinic with any problems, questions or concerns. We can certainly see the patient much sooner if necessary. Orders Placed This Encounter  Procedures  . CMP ([facility] only)    Standing Status:   Standing    Number of Occurrences:   20    Standing Expiration Date:   07/29/2019  . CBC with Differential (Cancer Center Only)    Standing Status:   Standing    Number of Occurrences:   20    Standing Expiration Date:   07/29/2019  . Beta 2 microglobulin    Standing Status:   Future    Standing Expiration Date:   07/29/2019  . QIG  (Quant. immunoglobulins  - IgG, IgA, IgM)    Standing Status:   Future    Standing Expiration Date:   07/29/2019  . Kappa/lambda light chains    Standing Status:   Future    Standing Expiration Date:   07/29/2019  . Lactate dehydrogenase (LDH)    Standing Status:   Future    Standing Expiration Date:   07/29/2019     Brenda Sos Brenda Blunck, PA-C 07/29/18  ADDENDUM: Hematology/Oncology Attending: I had a face-to-face encounter with the patient today.  I recommended her care plan.  This is a very pleasant 58 years old white female with multiple myeloma and currently undergoing treatment with subcutaneous Velcade, Revlimid and Decadron.  The patient continues to tolerate her treatment well except for  fatigue. I recommended for her to continue her treatment as planned. We will see her back for follow-up visit in 4 weeks for evaluation after repeating myeloma panel for reevaluation of her disease. She was advised to call immediately if she has any concerning symptoms in the interval.  Disclaimer: This note was dictated with voice recognition software. Similar sounding words can inadvertently be transcribed and may be missed upon review. Eilleen Kempf, MD 07/29/18

## 2018-07-29 NOTE — Telephone Encounter (Signed)
appts already scheduled per 7/21 LOS>

## 2018-07-30 DIAGNOSIS — G4733 Obstructive sleep apnea (adult) (pediatric): Secondary | ICD-10-CM | POA: Diagnosis not present

## 2018-08-05 ENCOUNTER — Inpatient Hospital Stay: Payer: BC Managed Care – PPO

## 2018-08-05 ENCOUNTER — Ambulatory Visit: Payer: BLUE CROSS/BLUE SHIELD | Admitting: Internal Medicine

## 2018-08-05 ENCOUNTER — Other Ambulatory Visit: Payer: Self-pay

## 2018-08-05 VITALS — BP 127/54 | HR 63 | Temp 97.8°F | Resp 16 | Wt 245.5 lb

## 2018-08-05 DIAGNOSIS — C9000 Multiple myeloma not having achieved remission: Secondary | ICD-10-CM

## 2018-08-05 DIAGNOSIS — J45909 Unspecified asthma, uncomplicated: Secondary | ICD-10-CM | POA: Diagnosis not present

## 2018-08-05 DIAGNOSIS — Z5112 Encounter for antineoplastic immunotherapy: Secondary | ICD-10-CM | POA: Diagnosis not present

## 2018-08-05 DIAGNOSIS — Z7984 Long term (current) use of oral hypoglycemic drugs: Secondary | ICD-10-CM | POA: Diagnosis not present

## 2018-08-05 DIAGNOSIS — Z79899 Other long term (current) drug therapy: Secondary | ICD-10-CM | POA: Diagnosis not present

## 2018-08-05 DIAGNOSIS — G4730 Sleep apnea, unspecified: Secondary | ICD-10-CM | POA: Diagnosis not present

## 2018-08-05 DIAGNOSIS — Z7951 Long term (current) use of inhaled steroids: Secondary | ICD-10-CM | POA: Diagnosis not present

## 2018-08-05 DIAGNOSIS — E7800 Pure hypercholesterolemia, unspecified: Secondary | ICD-10-CM | POA: Diagnosis not present

## 2018-08-05 DIAGNOSIS — I10 Essential (primary) hypertension: Secondary | ICD-10-CM | POA: Diagnosis not present

## 2018-08-05 DIAGNOSIS — Z791 Long term (current) use of non-steroidal anti-inflammatories (NSAID): Secondary | ICD-10-CM | POA: Diagnosis not present

## 2018-08-05 DIAGNOSIS — E039 Hypothyroidism, unspecified: Secondary | ICD-10-CM | POA: Diagnosis not present

## 2018-08-05 DIAGNOSIS — E119 Type 2 diabetes mellitus without complications: Secondary | ICD-10-CM | POA: Diagnosis not present

## 2018-08-05 DIAGNOSIS — F419 Anxiety disorder, unspecified: Secondary | ICD-10-CM | POA: Diagnosis not present

## 2018-08-05 DIAGNOSIS — F329 Major depressive disorder, single episode, unspecified: Secondary | ICD-10-CM | POA: Diagnosis not present

## 2018-08-05 DIAGNOSIS — Z7901 Long term (current) use of anticoagulants: Secondary | ICD-10-CM | POA: Diagnosis not present

## 2018-08-05 LAB — CBC WITH DIFFERENTIAL (CANCER CENTER ONLY)
Abs Immature Granulocytes: 0.01 10*3/uL (ref 0.00–0.07)
Basophils Absolute: 0.1 10*3/uL (ref 0.0–0.1)
Basophils Relative: 2 %
Eosinophils Absolute: 0 10*3/uL (ref 0.0–0.5)
Eosinophils Relative: 2 %
HCT: 30.4 % — ABNORMAL LOW (ref 36.0–46.0)
Hemoglobin: 9.5 g/dL — ABNORMAL LOW (ref 12.0–15.0)
Immature Granulocytes: 0 %
Lymphocytes Relative: 24 %
Lymphs Abs: 0.6 10*3/uL — ABNORMAL LOW (ref 0.7–4.0)
MCH: 27.8 pg (ref 26.0–34.0)
MCHC: 31.3 g/dL (ref 30.0–36.0)
MCV: 88.9 fL (ref 80.0–100.0)
Monocytes Absolute: 0.3 10*3/uL (ref 0.1–1.0)
Monocytes Relative: 12 %
Neutro Abs: 1.6 10*3/uL — ABNORMAL LOW (ref 1.7–7.7)
Neutrophils Relative %: 60 %
Platelet Count: 114 10*3/uL — ABNORMAL LOW (ref 150–400)
RBC: 3.42 MIL/uL — ABNORMAL LOW (ref 3.87–5.11)
RDW: 16.6 % — ABNORMAL HIGH (ref 11.5–15.5)
WBC Count: 2.6 10*3/uL — ABNORMAL LOW (ref 4.0–10.5)
nRBC: 0 % (ref 0.0–0.2)

## 2018-08-05 LAB — CMP (CANCER CENTER ONLY)
ALT: 32 U/L (ref 0–44)
AST: 38 U/L (ref 15–41)
Albumin: 3.6 g/dL (ref 3.5–5.0)
Alkaline Phosphatase: 135 U/L — ABNORMAL HIGH (ref 38–126)
Anion gap: 11 (ref 5–15)
BUN: 8 mg/dL (ref 6–20)
CO2: 20 mmol/L — ABNORMAL LOW (ref 22–32)
Calcium: 8.9 mg/dL (ref 8.9–10.3)
Chloride: 108 mmol/L (ref 98–111)
Creatinine: 0.76 mg/dL (ref 0.44–1.00)
GFR, Est AFR Am: >60 mL/min (ref >60)
GFR, Estimated: >60 mL/min (ref >60)
Glucose, Bld: 159 mg/dL — ABNORMAL HIGH (ref 70–99)
Potassium: 3.9 mmol/L (ref 3.5–5.1)
Sodium: 139 mmol/L (ref 135–145)
Total Bilirubin: 0.5 mg/dL (ref 0.3–1.2)
Total Protein: 6.6 g/dL (ref 6.5–8.1)

## 2018-08-05 MED ORDER — ONDANSETRON HCL 8 MG PO TABS
ORAL_TABLET | ORAL | Status: AC
  Filled 2018-08-05: qty 1

## 2018-08-05 MED ORDER — BORTEZOMIB CHEMO SQ INJECTION 3.5 MG (2.5MG/ML)
1.3000 mg/m2 | Freq: Once | INTRAMUSCULAR | Status: AC
  Administered 2018-08-05: 10:00:00 3 mg via SUBCUTANEOUS
  Filled 2018-08-05: qty 1.2

## 2018-08-05 MED ORDER — ONDANSETRON HCL 8 MG PO TABS
8.0000 mg | ORAL_TABLET | Freq: Once | ORAL | Status: AC
  Administered 2018-08-05: 8 mg via ORAL

## 2018-08-05 NOTE — Patient Instructions (Signed)
[facility] Discharge Instructions for Patients Receiving Chemotherapy  Today you received the following chemotherapy agents Velcade.  To help prevent nausea and vomiting after your treatment, we encourage you to take your nausea medication as directed.  If you develop nausea and vomiting that is not controlled by your nausea medication, call the clinic.   BELOW ARE SYMPTOMS THAT SHOULD BE REPORTED IMMEDIATELY:  *FEVER GREATER THAN 100.5 F  *CHILLS WITH OR WITHOUT FEVER  NAUSEA AND VOMITING THAT IS NOT CONTROLLED WITH YOUR NAUSEA MEDICATION  *UNUSUAL SHORTNESS OF BREATH  *UNUSUAL BRUISING OR BLEEDING  TENDERNESS IN MOUTH AND THROAT WITH OR WITHOUT PRESENCE OF ULCERS  *URINARY PROBLEMS  *BOWEL PROBLEMS  UNUSUAL RASH Items with * indicate a potential emergency and should be followed up as soon as possible.  Feel free to call the clinic should you have any questions or concerns. The clinic phone number is [phone].  Please show the CHEMO ALERT CARD at check-in to the Emergency Department and triage nurse.   

## 2018-08-07 DIAGNOSIS — E1165 Type 2 diabetes mellitus with hyperglycemia: Secondary | ICD-10-CM | POA: Diagnosis not present

## 2018-08-07 DIAGNOSIS — E039 Hypothyroidism, unspecified: Secondary | ICD-10-CM | POA: Diagnosis not present

## 2018-08-07 DIAGNOSIS — G609 Hereditary and idiopathic neuropathy, unspecified: Secondary | ICD-10-CM | POA: Diagnosis not present

## 2018-08-07 DIAGNOSIS — E7800 Pure hypercholesterolemia, unspecified: Secondary | ICD-10-CM | POA: Diagnosis not present

## 2018-08-12 ENCOUNTER — Other Ambulatory Visit: Payer: Self-pay

## 2018-08-12 ENCOUNTER — Inpatient Hospital Stay: Payer: BC Managed Care – PPO

## 2018-08-12 ENCOUNTER — Inpatient Hospital Stay: Payer: BC Managed Care – PPO | Attending: Internal Medicine

## 2018-08-12 VITALS — BP 104/57 | HR 65 | Temp 97.3°F | Resp 18 | Wt 245.8 lb

## 2018-08-12 DIAGNOSIS — Z7984 Long term (current) use of oral hypoglycemic drugs: Secondary | ICD-10-CM | POA: Diagnosis not present

## 2018-08-12 DIAGNOSIS — Z880 Allergy status to penicillin: Secondary | ICD-10-CM | POA: Insufficient documentation

## 2018-08-12 DIAGNOSIS — I10 Essential (primary) hypertension: Secondary | ICD-10-CM | POA: Diagnosis not present

## 2018-08-12 DIAGNOSIS — Z79899 Other long term (current) drug therapy: Secondary | ICD-10-CM | POA: Insufficient documentation

## 2018-08-12 DIAGNOSIS — C9000 Multiple myeloma not having achieved remission: Secondary | ICD-10-CM | POA: Diagnosis not present

## 2018-08-12 DIAGNOSIS — R5383 Other fatigue: Secondary | ICD-10-CM | POA: Diagnosis not present

## 2018-08-12 DIAGNOSIS — Z5112 Encounter for antineoplastic immunotherapy: Secondary | ICD-10-CM | POA: Diagnosis not present

## 2018-08-12 DIAGNOSIS — Z7901 Long term (current) use of anticoagulants: Secondary | ICD-10-CM | POA: Diagnosis not present

## 2018-08-12 DIAGNOSIS — Z885 Allergy status to narcotic agent status: Secondary | ICD-10-CM | POA: Diagnosis not present

## 2018-08-12 LAB — CBC WITH DIFFERENTIAL (CANCER CENTER ONLY)
Abs Immature Granulocytes: 0.02 10*3/uL (ref 0.00–0.07)
Basophils Absolute: 0 10*3/uL (ref 0.0–0.1)
Basophils Relative: 1 %
Eosinophils Absolute: 0.1 10*3/uL (ref 0.0–0.5)
Eosinophils Relative: 2 %
HCT: 32 % — ABNORMAL LOW (ref 36.0–46.0)
Hemoglobin: 9.9 g/dL — ABNORMAL LOW (ref 12.0–15.0)
Immature Granulocytes: 1 %
Lymphocytes Relative: 19 %
Lymphs Abs: 0.5 10*3/uL — ABNORMAL LOW (ref 0.7–4.0)
MCH: 27.4 pg (ref 26.0–34.0)
MCHC: 30.9 g/dL (ref 30.0–36.0)
MCV: 88.6 fL (ref 80.0–100.0)
Monocytes Absolute: 0.2 10*3/uL (ref 0.1–1.0)
Monocytes Relative: 7 %
Neutro Abs: 1.9 10*3/uL (ref 1.7–7.7)
Neutrophils Relative %: 70 %
Platelet Count: 121 10*3/uL — ABNORMAL LOW (ref 150–400)
RBC: 3.61 MIL/uL — ABNORMAL LOW (ref 3.87–5.11)
RDW: 16.6 % — ABNORMAL HIGH (ref 11.5–15.5)
WBC Count: 2.7 10*3/uL — ABNORMAL LOW (ref 4.0–10.5)
nRBC: 0 % (ref 0.0–0.2)

## 2018-08-12 LAB — CMP (CANCER CENTER ONLY)
ALT: 33 U/L (ref 0–44)
AST: 33 U/L (ref 15–41)
Albumin: 3.8 g/dL (ref 3.5–5.0)
Alkaline Phosphatase: 130 U/L — ABNORMAL HIGH (ref 38–126)
Anion gap: 12 (ref 5–15)
BUN: 8 mg/dL (ref 6–20)
CO2: 22 mmol/L (ref 22–32)
Calcium: 9.2 mg/dL (ref 8.9–10.3)
Chloride: 105 mmol/L (ref 98–111)
Creatinine: 0.72 mg/dL (ref 0.44–1.00)
GFR, Est AFR Am: >60 mL/min (ref >60)
GFR, Estimated: >60 mL/min (ref >60)
Glucose, Bld: 173 mg/dL — ABNORMAL HIGH (ref 70–99)
Potassium: 3.9 mmol/L (ref 3.5–5.1)
Sodium: 139 mmol/L (ref 135–145)
Total Bilirubin: 0.6 mg/dL (ref 0.3–1.2)
Total Protein: 6.8 g/dL (ref 6.5–8.1)

## 2018-08-12 MED ORDER — ONDANSETRON HCL 8 MG PO TABS
8.0000 mg | ORAL_TABLET | Freq: Once | ORAL | Status: AC
  Administered 2018-08-12: 8 mg via ORAL

## 2018-08-12 MED ORDER — BORTEZOMIB CHEMO SQ INJECTION 3.5 MG (2.5MG/ML)
1.3000 mg/m2 | Freq: Once | INTRAMUSCULAR | Status: AC
  Administered 2018-08-12: 3 mg via SUBCUTANEOUS
  Filled 2018-08-12: qty 1.2

## 2018-08-12 MED ORDER — ONDANSETRON HCL 8 MG PO TABS
ORAL_TABLET | ORAL | Status: AC
  Filled 2018-08-12: qty 1

## 2018-08-12 NOTE — Patient Instructions (Signed)
[facility] Discharge Instructions for Patients Receiving Chemotherapy  Today you received the following chemotherapy agents Velcade.  To help prevent nausea and vomiting after your treatment, we encourage you to take your nausea medication as directed.  If you develop nausea and vomiting that is not controlled by your nausea medication, call the clinic.   BELOW ARE SYMPTOMS THAT SHOULD BE REPORTED IMMEDIATELY:  *FEVER GREATER THAN 100.5 F  *CHILLS WITH OR WITHOUT FEVER  NAUSEA AND VOMITING THAT IS NOT CONTROLLED WITH YOUR NAUSEA MEDICATION  *UNUSUAL SHORTNESS OF BREATH  *UNUSUAL BRUISING OR BLEEDING  TENDERNESS IN MOUTH AND THROAT WITH OR WITHOUT PRESENCE OF ULCERS  *URINARY PROBLEMS  *BOWEL PROBLEMS  UNUSUAL RASH Items with * indicate a potential emergency and should be followed up as soon as possible.  Feel free to call the clinic should you have any questions or concerns. The clinic phone number is [phone].  Please show the CHEMO ALERT CARD at check-in to the Emergency Department and triage nurse.   

## 2018-08-19 ENCOUNTER — Other Ambulatory Visit: Payer: Self-pay | Admitting: Internal Medicine

## 2018-08-19 ENCOUNTER — Inpatient Hospital Stay: Payer: BC Managed Care – PPO

## 2018-08-19 ENCOUNTER — Other Ambulatory Visit: Payer: Self-pay

## 2018-08-19 VITALS — BP 114/56 | HR 57 | Temp 97.9°F | Resp 18

## 2018-08-19 DIAGNOSIS — Z885 Allergy status to narcotic agent status: Secondary | ICD-10-CM | POA: Diagnosis not present

## 2018-08-19 DIAGNOSIS — C9000 Multiple myeloma not having achieved remission: Secondary | ICD-10-CM

## 2018-08-19 DIAGNOSIS — Z880 Allergy status to penicillin: Secondary | ICD-10-CM | POA: Diagnosis not present

## 2018-08-19 DIAGNOSIS — I10 Essential (primary) hypertension: Secondary | ICD-10-CM | POA: Diagnosis not present

## 2018-08-19 DIAGNOSIS — R5383 Other fatigue: Secondary | ICD-10-CM | POA: Diagnosis not present

## 2018-08-19 DIAGNOSIS — Z5112 Encounter for antineoplastic immunotherapy: Secondary | ICD-10-CM | POA: Diagnosis not present

## 2018-08-19 DIAGNOSIS — Z7901 Long term (current) use of anticoagulants: Secondary | ICD-10-CM | POA: Diagnosis not present

## 2018-08-19 DIAGNOSIS — Z79899 Other long term (current) drug therapy: Secondary | ICD-10-CM | POA: Diagnosis not present

## 2018-08-19 DIAGNOSIS — Z7984 Long term (current) use of oral hypoglycemic drugs: Secondary | ICD-10-CM | POA: Diagnosis not present

## 2018-08-19 LAB — CBC WITH DIFFERENTIAL (CANCER CENTER ONLY)
Abs Immature Granulocytes: 0.02 10*3/uL (ref 0.00–0.07)
Basophils Absolute: 0 10*3/uL (ref 0.0–0.1)
Basophils Relative: 1 %
Eosinophils Absolute: 0.1 10*3/uL (ref 0.0–0.5)
Eosinophils Relative: 2 %
HCT: 31.3 % — ABNORMAL LOW (ref 36.0–46.0)
Hemoglobin: 9.8 g/dL — ABNORMAL LOW (ref 12.0–15.0)
Immature Granulocytes: 1 %
Lymphocytes Relative: 17 %
Lymphs Abs: 0.6 10*3/uL — ABNORMAL LOW (ref 0.7–4.0)
MCH: 27.6 pg (ref 26.0–34.0)
MCHC: 31.3 g/dL (ref 30.0–36.0)
MCV: 88.2 fL (ref 80.0–100.0)
Monocytes Absolute: 0.4 10*3/uL (ref 0.1–1.0)
Monocytes Relative: 11 %
Neutro Abs: 2.3 10*3/uL (ref 1.7–7.7)
Neutrophils Relative %: 68 %
Platelet Count: 116 10*3/uL — ABNORMAL LOW (ref 150–400)
RBC: 3.55 MIL/uL — ABNORMAL LOW (ref 3.87–5.11)
RDW: 16.7 % — ABNORMAL HIGH (ref 11.5–15.5)
WBC Count: 3.3 10*3/uL — ABNORMAL LOW (ref 4.0–10.5)
nRBC: 0 % (ref 0.0–0.2)

## 2018-08-19 LAB — CMP (CANCER CENTER ONLY)
ALT: 40 U/L (ref 0–44)
AST: 49 U/L — ABNORMAL HIGH (ref 15–41)
Albumin: 4 g/dL (ref 3.5–5.0)
Alkaline Phosphatase: 119 U/L (ref 38–126)
Anion gap: 13 (ref 5–15)
BUN: 7 mg/dL (ref 6–20)
CO2: 21 mmol/L — ABNORMAL LOW (ref 22–32)
Calcium: 9 mg/dL (ref 8.9–10.3)
Chloride: 104 mmol/L (ref 98–111)
Creatinine: 0.68 mg/dL (ref 0.44–1.00)
GFR, Est AFR Am: >60 mL/min (ref >60)
GFR, Estimated: >60 mL/min (ref >60)
Glucose, Bld: 165 mg/dL — ABNORMAL HIGH (ref 70–99)
Potassium: 3.4 mmol/L — ABNORMAL LOW (ref 3.5–5.1)
Sodium: 138 mmol/L (ref 135–145)
Total Bilirubin: 0.7 mg/dL (ref 0.3–1.2)
Total Protein: 6.9 g/dL (ref 6.5–8.1)

## 2018-08-19 LAB — LACTATE DEHYDROGENASE: LDH: 120 U/L (ref 98–192)

## 2018-08-19 MED ORDER — ONDANSETRON HCL 8 MG PO TABS
8.0000 mg | ORAL_TABLET | Freq: Once | ORAL | Status: AC
  Administered 2018-08-19: 8 mg via ORAL

## 2018-08-19 MED ORDER — BORTEZOMIB CHEMO SQ INJECTION 3.5 MG (2.5MG/ML)
1.3000 mg/m2 | Freq: Once | INTRAMUSCULAR | Status: AC
  Administered 2018-08-19: 3 mg via SUBCUTANEOUS
  Filled 2018-08-19: qty 1.2

## 2018-08-19 MED ORDER — ONDANSETRON HCL 8 MG PO TABS
ORAL_TABLET | ORAL | Status: AC
  Filled 2018-08-19: qty 1

## 2018-08-19 NOTE — Patient Instructions (Signed)
[facility] Discharge Instructions for Patients Receiving Chemotherapy  Today you received the following chemotherapy agents Velcade.  To help prevent nausea and vomiting after your treatment, we encourage you to take your nausea medication as directed.  If you develop nausea and vomiting that is not controlled by your nausea medication, call the clinic.   BELOW ARE SYMPTOMS THAT SHOULD BE REPORTED IMMEDIATELY:  *FEVER GREATER THAN 100.5 F  *CHILLS WITH OR WITHOUT FEVER  NAUSEA AND VOMITING THAT IS NOT CONTROLLED WITH YOUR NAUSEA MEDICATION  *UNUSUAL SHORTNESS OF BREATH  *UNUSUAL BRUISING OR BLEEDING  TENDERNESS IN MOUTH AND THROAT WITH OR WITHOUT PRESENCE OF ULCERS  *URINARY PROBLEMS  *BOWEL PROBLEMS  UNUSUAL RASH Items with * indicate a potential emergency and should be followed up as soon as possible.  Feel free to call the clinic should you have any questions or concerns. The clinic phone number is [phone].  Please show the CHEMO ALERT CARD at check-in to the Emergency Department and triage nurse.   

## 2018-08-20 ENCOUNTER — Other Ambulatory Visit: Payer: Self-pay | Admitting: Internal Medicine

## 2018-08-20 DIAGNOSIS — C9000 Multiple myeloma not having achieved remission: Secondary | ICD-10-CM

## 2018-08-20 LAB — KAPPA/LAMBDA LIGHT CHAINS
Kappa free light chain: 15 mg/L (ref 3.3–19.4)
Kappa, lambda light chain ratio: 0.04 — ABNORMAL LOW (ref 0.26–1.65)
Lambda free light chains: 355.4 mg/L — ABNORMAL HIGH (ref 5.7–26.3)

## 2018-08-20 LAB — BETA 2 MICROGLOBULIN, SERUM: Beta-2 Microglobulin: 1.5 mg/L (ref 0.6–2.4)

## 2018-08-20 LAB — IGG, IGA, IGM
IgA: 111 mg/dL (ref 87–352)
IgG (Immunoglobin G), Serum: 423 mg/dL — ABNORMAL LOW (ref 586–1602)
IgM (Immunoglobulin M), Srm: 419 mg/dL — ABNORMAL HIGH (ref 26–217)

## 2018-08-22 ENCOUNTER — Other Ambulatory Visit: Payer: Self-pay | Admitting: *Deleted

## 2018-08-22 DIAGNOSIS — C9000 Multiple myeloma not having achieved remission: Secondary | ICD-10-CM

## 2018-08-22 MED ORDER — LENALIDOMIDE 25 MG PO CAPS: ORAL_CAPSULE | ORAL | 0 refills | Status: DC

## 2018-08-26 ENCOUNTER — Telehealth: Payer: Self-pay | Admitting: Internal Medicine

## 2018-08-26 ENCOUNTER — Other Ambulatory Visit: Payer: Self-pay

## 2018-08-26 ENCOUNTER — Inpatient Hospital Stay: Payer: BC Managed Care – PPO

## 2018-08-26 ENCOUNTER — Encounter: Payer: Self-pay | Admitting: Internal Medicine

## 2018-08-26 ENCOUNTER — Inpatient Hospital Stay (HOSPITAL_BASED_OUTPATIENT_CLINIC_OR_DEPARTMENT_OTHER): Payer: BC Managed Care – PPO | Admitting: Internal Medicine

## 2018-08-26 VITALS — BP 127/61 | HR 62 | Temp 98.0°F | Resp 18 | Ht 67.0 in | Wt 243.4 lb

## 2018-08-26 DIAGNOSIS — Z79899 Other long term (current) drug therapy: Secondary | ICD-10-CM | POA: Diagnosis not present

## 2018-08-26 DIAGNOSIS — I10 Essential (primary) hypertension: Secondary | ICD-10-CM

## 2018-08-26 DIAGNOSIS — Z7984 Long term (current) use of oral hypoglycemic drugs: Secondary | ICD-10-CM | POA: Diagnosis not present

## 2018-08-26 DIAGNOSIS — Z885 Allergy status to narcotic agent status: Secondary | ICD-10-CM | POA: Diagnosis not present

## 2018-08-26 DIAGNOSIS — C9000 Multiple myeloma not having achieved remission: Secondary | ICD-10-CM

## 2018-08-26 DIAGNOSIS — Z5111 Encounter for antineoplastic chemotherapy: Secondary | ICD-10-CM | POA: Diagnosis not present

## 2018-08-26 DIAGNOSIS — Z5112 Encounter for antineoplastic immunotherapy: Secondary | ICD-10-CM | POA: Diagnosis not present

## 2018-08-26 DIAGNOSIS — Z7901 Long term (current) use of anticoagulants: Secondary | ICD-10-CM | POA: Diagnosis not present

## 2018-08-26 DIAGNOSIS — Z880 Allergy status to penicillin: Secondary | ICD-10-CM | POA: Diagnosis not present

## 2018-08-26 DIAGNOSIS — R5383 Other fatigue: Secondary | ICD-10-CM | POA: Diagnosis not present

## 2018-08-26 LAB — CBC WITH DIFFERENTIAL (CANCER CENTER ONLY)
Abs Immature Granulocytes: 0.02 10*3/uL (ref 0.00–0.07)
Basophils Absolute: 0 10*3/uL (ref 0.0–0.1)
Basophils Relative: 1 %
Eosinophils Absolute: 0.2 10*3/uL (ref 0.0–0.5)
Eosinophils Relative: 6 %
HCT: 30.5 % — ABNORMAL LOW (ref 36.0–46.0)
Hemoglobin: 9.8 g/dL — ABNORMAL LOW (ref 12.0–15.0)
Immature Granulocytes: 1 %
Lymphocytes Relative: 24 %
Lymphs Abs: 0.6 10*3/uL — ABNORMAL LOW (ref 0.7–4.0)
MCH: 28.5 pg (ref 26.0–34.0)
MCHC: 32.1 g/dL (ref 30.0–36.0)
MCV: 88.7 fL (ref 80.0–100.0)
Monocytes Absolute: 0.3 10*3/uL (ref 0.1–1.0)
Monocytes Relative: 11 %
Neutro Abs: 1.5 10*3/uL — ABNORMAL LOW (ref 1.7–7.7)
Neutrophils Relative %: 57 %
Platelet Count: 103 10*3/uL — ABNORMAL LOW (ref 150–400)
RBC: 3.44 MIL/uL — ABNORMAL LOW (ref 3.87–5.11)
RDW: 16.9 % — ABNORMAL HIGH (ref 11.5–15.5)
WBC Count: 2.6 10*3/uL — ABNORMAL LOW (ref 4.0–10.5)
nRBC: 0 % (ref 0.0–0.2)

## 2018-08-26 LAB — CMP (CANCER CENTER ONLY)
ALT: 39 U/L (ref 0–44)
AST: 38 U/L (ref 15–41)
Albumin: 3.8 g/dL (ref 3.5–5.0)
Alkaline Phosphatase: 127 U/L — ABNORMAL HIGH (ref 38–126)
Anion gap: 14 (ref 5–15)
BUN: 8 mg/dL (ref 6–20)
CO2: 21 mmol/L — ABNORMAL LOW (ref 22–32)
Calcium: 8.7 mg/dL — ABNORMAL LOW (ref 8.9–10.3)
Chloride: 105 mmol/L (ref 98–111)
Creatinine: 0.75 mg/dL (ref 0.44–1.00)
GFR, Est AFR Am: >60 mL/min (ref >60)
GFR, Estimated: >60 mL/min (ref >60)
Glucose, Bld: 155 mg/dL — ABNORMAL HIGH (ref 70–99)
Potassium: 3.7 mmol/L (ref 3.5–5.1)
Sodium: 140 mmol/L (ref 135–145)
Total Bilirubin: 0.6 mg/dL (ref 0.3–1.2)
Total Protein: 6.6 g/dL (ref 6.5–8.1)

## 2018-08-26 MED ORDER — ONDANSETRON HCL 8 MG PO TABS
8.0000 mg | ORAL_TABLET | Freq: Once | ORAL | Status: AC
  Administered 2018-08-26: 09:00:00 8 mg via ORAL

## 2018-08-26 MED ORDER — BORTEZOMIB CHEMO SQ INJECTION 3.5 MG (2.5MG/ML)
1.3000 mg/m2 | Freq: Once | INTRAMUSCULAR | Status: AC
  Administered 2018-08-26: 3 mg via SUBCUTANEOUS
  Filled 2018-08-26: qty 1.2

## 2018-08-26 MED ORDER — ONDANSETRON HCL 8 MG PO TABS
ORAL_TABLET | ORAL | Status: AC
  Filled 2018-08-26: qty 1

## 2018-08-26 NOTE — Progress Notes (Signed)
[place] [phone]   [phone]  OFFICE PROGRESS NOTE  [name], MD [place] [id number]  DIAGNOSIS: Plasma cell dyscrasia initially diagnosed as MGUS in September 2010, with additional symptoms suggestive of POEMS syndrome.   PRIOR THERAPY:  1) Velcade 1.3 MG/M2 subcutaneously with Decadron 40 mg by mouth on a weekly basis. First cycle 11/24/2013. She status post 31 weekly doses of treatment. 2) Velcade 1.3 MG/M2 subcutaneously and weekly basis with Decadron 40 mg by mouth weekly. First dose 02/01/2015. Status post 28 cycles. 3) Revlimid 25 mg by mouth daily for 21 days every 4 weeks with weekly Decadron 20 mg. started in 11/27/2015. Status post 3 cycles discontinued secondary to lack of response.  CURRENT THERAPY: Systemic treatment with Velcade 1.3 MG/KG weekly, Revlimid 25 mg by mouth daily for 21 days every 4 weeks in addition to Decadron 20 mg by mouth weekly. First dose 03/06/2016. Status post 30 cycles.   INTERVAL HISTORY: Brenda Conner 58 y.o. female returns to the clinic today for follow-up visit.  The patient is feeling fine today with no concerning complaints except for fatigue.  She denied having any chest pain, shortness of breath, cough or hemoptysis.  She denied having any fever or chills.  She has no nausea, vomiting, diarrhea or constipation.  She denied having any headache or visual changes.  She has been tolerating her treatment with Velcade, Revlimid and Decadron fairly well except for the fatigue.  She had repeat myeloma panel performed recently and she is here for evaluation and discussion of her lab results.  She is resting and taking a break off treatment if possible.   MEDICAL HISTORY: Past Medical History:  Diagnosis Date  . Acid reflux   . Anxiety   . Asthma   . Cancer [facility])    waldenstroms/ macroglobinulemia  . Depression   . Depression   . Diabetes mellitus without  complication ([place])   . Dysuria 02/28/2016  . Hypercholesteremia   . Hypertension   . Hypothyroidism   . Macroglobulinemia ([place])    ? POEMS syndrome  . Multiple myeloma ([place])   . Obesity   . PONV (postoperative nausea and vomiting)   . Sleep apnea    CPAP at bedtime  . Sleep apnea     ALLERGIES:  is allergic to codeine; hydrocodone; lortab [hydrocodone-acetaminophen]; onion; shellfish allergy; and amoxicillin.  MEDICATIONS:  Current Outpatient Medications  Medication Sig Dispense Refill  . acyclovir (ZOVIRAX) 400 MG tablet Take 1 tablet (400 mg total) by mouth 2 (two) times daily. 180 tablet 1  . ALPRAZolam (XANAX) 0.5 MG tablet Take 1 tablet (0.5 mg total) by mouth at bedtime as needed for anxiety. 30 tablet 0  . Azelastine HCl 0.15 % SOLN as needed.    . Blood Glucose Monitoring Suppl (ONE TOUCH ULTRA MINI) W/DEVICE KIT See admin instructions. Reported on 01/25/2015  0  . Cetirizine HCl (ZYRTEC ALLERGY PO) Take by mouth daily.    [name] dexamethasone (DECADRON) 4 MG tablet TAKE 1 TABLET (4 MG TOTAL) BY MOUTH ONCE A WEEK. PATIENT TAKES 5 TABLETS 1 DAY WEEK PRIOR TO VELCADE APPOINTMENT. 40 tablet 1  . DiphenhydrAMINE HCl (BENADRYL ALLERGY PO) Take by mouth as needed.    [name] EPIPEN 2-PAK 0.3 MG/0.3ML SOAJ injection Reported on 06/21/2015  1  . Fexofenadine HCl (ALLEGRA ALLERGY PO) Take by mouth daily.    . Fluticasone-Salmeterol (ADVAIR) 100-50 MCG/DOSE  AEPB Inhale 2 puffs into the lungs every 12 (twelve) hours.    [name] ibuprofen (ADVIL,MOTRIN) 100 MG tablet Take 100 mg by mouth every 6 (six) hours as needed. Reported on 05/31/2015    . lenalidomide (REVLIMID) 25 MG capsule TAKE 1 CAPSULE BY MOUTH ONCE DAILY 21 DAYS ON AND 7 DAYS OFF Authorization # [id number] 08/22/18 21 capsule 0  . levothyroxine (SYNTHROID, LEVOTHROID) 150 MCG tablet Take 150 mcg by mouth daily before breakfast.    . lisinopril (PRINIVIL,ZESTRIL) 10 MG tablet Take 10 mg by mouth daily. auth number 05/29/2018 [id number]  3  . loperamide  (IMODIUM) 2 MG capsule Take 2 mg by mouth as needed for diarrhea or loose stools.    . magic mouthwash w/lidocaine SOLN Take 5 mLs by mouth 4 (four) times daily as needed for mouth pain. Swish, Gargle, and spit 240 mL 1  . metFORMIN (GLUCOPHAGE-XR) 500 MG 24 hr tablet Take 500 mg by mouth daily.    [name] omeprazole (PRILOSEC) 40 MG capsule Take 40 mg by mouth 2 (two) times daily.     . ondansetron (ZOFRAN-ODT) 8 MG disintegrating tablet Take 1 tablet (8 mg total) by mouth every 8 (eight) hours as needed. Reported on 05/31/2015 20 tablet 2  . pravastatin (PRAVACHOL) 40 MG tablet Take 40 mg by mouth every evening.     [name] PROAIR HFA 108 (90 Base) MCG/ACT inhaler Reported on 06/21/2015  1  . sertraline (ZOLOFT) 50 MG tablet Take 3 tablets (150 mg total) by mouth daily. 270 tablet 2  . verapamil (CALAN-SR) 240 MG CR tablet Take 240 mg by mouth 2 (two) times daily.    [name] warfarin (COUMADIN) 2 MG tablet TAKE 1 TABLET BY MOUTH EVERY DAY 90 tablet 0   No current facility-administered medications for this visit.     SURGICAL HISTORY:  Past Surgical History:  Procedure Laterality Date  . ABLATION  09/2007   HTA and polyp resection  . BACK SURGERY  03/2004   herniation, L4-L5  . Bil Laprascopic knee surgery    . BONE MARROW BIOPSY     2011  . BONE MARROW BIOPSY  4/14  . CHOLECYSTECTOMY    . FOOT SURGERY  1998  . KNEE ARTHROSCOPY W/ MENISCAL REPAIR  10/10 ; 3/11  . LAPAROSCOPIC CHOLECYSTECTOMY  1997  . NASAL SINUS SURGERY  2004  . UTERINE FIBROID EMBOLIZATION      REVIEW OF SYSTEMS:  Constitutional: positive for fatigue Eyes: negative Ears, nose, mouth, throat, and face: negative Respiratory: negative Cardiovascular: negative Gastrointestinal: negative Genitourinary:negative Integument/breast: negative Hematologic/lymphatic: negative Musculoskeletal:negative Neurological: negative Behavioral/Psych: negative Endocrine: negative Allergic/Immunologic: negative   PHYSICAL EXAMINATION: General  appearance: alert, cooperative, fatigued and no distress Head: Normocephalic, without obvious abnormality, atraumatic Neck: no adenopathy Lymph nodes: Cervical, supraclavicular, and axillary nodes normal. Resp: clear to auscultation bilaterally Back: symmetric, no curvature. ROM normal. No CVA tenderness. Cardio: regular rate and rhythm, S1, S2 normal, no murmur, click, rub or gallop GI: soft, non-tender; bowel sounds normal; no masses,  no organomegaly Extremities: extremities normal, atraumatic, no cyanosis or edema Neurologic: Alert and oriented X 3, normal strength and tone. Normal symmetric reflexes. Normal coordination and gait  ECOG PERFORMANCE STATUS: 1 - Symptomatic but completely ambulatory  Blood pressure 127/61, pulse 62, temperature 98 F (36.7 C), resp. rate 18, height 5' 7" (1.702 m), weight 243 lb 6.4 oz (110.4 kg), SpO2 100 %.  LABORATORY DATA: Lab Results  Component Value Date   WBC 2.6 (L) 08/26/2018  HGB 9.8 (L) 08/26/2018   HCT 30.5 (L) 08/26/2018   MCV 88.7 08/26/2018   PLT 103 (L) 08/26/2018      Chemistry      Component Value Date/Time   NA 138 08/19/2018 0818   NA 138 11/14/2016 0824   K 3.4 (L) 08/19/2018 0818   K 4.3 11/14/2016 0824   CL 104 08/19/2018 0818   CL 104 04/07/2012 1415   CO2 21 (L) 08/19/2018 0818   CO2 23 11/14/2016 0824   BUN 7 08/19/2018 0818   BUN 9.1 11/14/2016 0824   CREATININE 0.68 08/19/2018 0818   CREATININE 0.7 11/14/2016 0824      Component Value Date/Time   CALCIUM 9.0 08/19/2018 0818   CALCIUM 9.6 11/14/2016 0824   ALKPHOS 119 08/19/2018 0818   ALKPHOS 94 11/14/2016 0824   AST 49 (H) 08/19/2018 0818   AST 28 11/14/2016 0824   ALT 40 08/19/2018 0818   ALT 24 11/14/2016 0824   BILITOT 0.7 08/19/2018 0818   BILITOT 0.41 11/14/2016 0824      ASSESSMENT AND PLAN:  This is a very pleasant 58 years old white female with multiple myeloma status post several chemotherapy regimens. The patient has been on treatment  with subcutaneous weekly Velcade as well as Revlimid and DecadronStatus post 30 cycles.  The patient has been tolerating this treatment well except for fatigue. She had repeat myeloma panel performed recently.  I discussed the myeloma panel with the patient today.  That showed no concerning findings for progression and the patient has a stable disease. She was given the option of continuing her current treatment versus taking a break off treatment for the next few months.  The patient would like to take a break off treatment to visit her family and regain her stamina. I will see her back for follow-up visit in 3 months for evaluation with repeat myeloma panel. For hypertension she will continue with her current blood pressure medication. The patient was advised to call immediately if she has any concerning symptoms in the interval. All questions were answered. The patient knows to call the clinic with any problems, questions or concerns. We can certainly see the patient much sooner if necessary.  Disclaimer: This note was dictated with voice recognition software. Similar sounding words can inadvertently be transcribed and may not be corrected upon review.

## 2018-08-26 NOTE — Telephone Encounter (Signed)
Scheduled appt per 8/18 los - called pt . Unable to reach pt . Left message with appt date and time

## 2018-08-26 NOTE — Patient Instructions (Signed)
[facility] Discharge Instructions for Patients Receiving Chemotherapy  Today you received the following chemotherapy agents Velcade.  To help prevent nausea and vomiting after your treatment, we encourage you to take your nausea medication as directed.  If you develop nausea and vomiting that is not controlled by your nausea medication, call the clinic.   BELOW ARE SYMPTOMS THAT SHOULD BE REPORTED IMMEDIATELY:  *FEVER GREATER THAN 100.5 F  *CHILLS WITH OR WITHOUT FEVER  NAUSEA AND VOMITING THAT IS NOT CONTROLLED WITH YOUR NAUSEA MEDICATION  *UNUSUAL SHORTNESS OF BREATH  *UNUSUAL BRUISING OR BLEEDING  TENDERNESS IN MOUTH AND THROAT WITH OR WITHOUT PRESENCE OF ULCERS  *URINARY PROBLEMS  *BOWEL PROBLEMS  UNUSUAL RASH Items with * indicate a potential emergency and should be followed up as soon as possible.  Feel free to call the clinic should you have any questions or concerns. The clinic phone number is [phone].  Please show the CHEMO ALERT CARD at check-in to the Emergency Department and triage nurse.   

## 2018-08-30 DIAGNOSIS — G4733 Obstructive sleep apnea (adult) (pediatric): Secondary | ICD-10-CM | POA: Diagnosis not present

## 2018-09-25 ENCOUNTER — Encounter: Payer: Self-pay | Admitting: Internal Medicine

## 2018-09-26 ENCOUNTER — Encounter: Payer: Self-pay | Admitting: *Deleted

## 2018-10-25 ENCOUNTER — Other Ambulatory Visit: Payer: Self-pay | Admitting: Internal Medicine

## 2018-10-25 DIAGNOSIS — C9000 Multiple myeloma not having achieved remission: Secondary | ICD-10-CM

## 2018-10-30 DIAGNOSIS — G4733 Obstructive sleep apnea (adult) (pediatric): Secondary | ICD-10-CM | POA: Diagnosis not present

## 2018-11-05 DIAGNOSIS — E7800 Pure hypercholesterolemia, unspecified: Secondary | ICD-10-CM | POA: Diagnosis not present

## 2018-11-05 DIAGNOSIS — E1165 Type 2 diabetes mellitus with hyperglycemia: Secondary | ICD-10-CM | POA: Diagnosis not present

## 2018-11-05 DIAGNOSIS — G609 Hereditary and idiopathic neuropathy, unspecified: Secondary | ICD-10-CM | POA: Diagnosis not present

## 2018-11-05 DIAGNOSIS — I10 Essential (primary) hypertension: Secondary | ICD-10-CM | POA: Diagnosis not present

## 2018-11-05 DIAGNOSIS — E039 Hypothyroidism, unspecified: Secondary | ICD-10-CM | POA: Diagnosis not present

## 2018-11-19 ENCOUNTER — Other Ambulatory Visit: Payer: Self-pay

## 2018-11-19 ENCOUNTER — Inpatient Hospital Stay: Payer: BC Managed Care – PPO | Attending: Internal Medicine

## 2018-11-19 DIAGNOSIS — I10 Essential (primary) hypertension: Secondary | ICD-10-CM | POA: Insufficient documentation

## 2018-11-19 DIAGNOSIS — Z79899 Other long term (current) drug therapy: Secondary | ICD-10-CM | POA: Insufficient documentation

## 2018-11-19 DIAGNOSIS — Z7901 Long term (current) use of anticoagulants: Secondary | ICD-10-CM | POA: Diagnosis not present

## 2018-11-19 DIAGNOSIS — R5383 Other fatigue: Secondary | ICD-10-CM | POA: Insufficient documentation

## 2018-11-19 DIAGNOSIS — Z7984 Long term (current) use of oral hypoglycemic drugs: Secondary | ICD-10-CM | POA: Insufficient documentation

## 2018-11-19 DIAGNOSIS — D472 Monoclonal gammopathy: Secondary | ICD-10-CM | POA: Insufficient documentation

## 2018-11-19 DIAGNOSIS — C9000 Multiple myeloma not having achieved remission: Secondary | ICD-10-CM

## 2018-11-19 DIAGNOSIS — Z885 Allergy status to narcotic agent status: Secondary | ICD-10-CM | POA: Diagnosis not present

## 2018-11-19 DIAGNOSIS — Z880 Allergy status to penicillin: Secondary | ICD-10-CM | POA: Insufficient documentation

## 2018-11-19 DIAGNOSIS — M2550 Pain in unspecified joint: Secondary | ICD-10-CM | POA: Insufficient documentation

## 2018-11-19 DIAGNOSIS — E119 Type 2 diabetes mellitus without complications: Secondary | ICD-10-CM | POA: Insufficient documentation

## 2018-11-19 LAB — CBC WITH DIFFERENTIAL (CANCER CENTER ONLY)
Abs Immature Granulocytes: 0.02 10*3/uL (ref 0.00–0.07)
Basophils Absolute: 0 10*3/uL (ref 0.0–0.1)
Basophils Relative: 1 %
Eosinophils Absolute: 0.1 10*3/uL (ref 0.0–0.5)
Eosinophils Relative: 2 %
HCT: 31.3 % — ABNORMAL LOW (ref 36.0–46.0)
Hemoglobin: 10.1 g/dL — ABNORMAL LOW (ref 12.0–15.0)
Immature Granulocytes: 1 %
Lymphocytes Relative: 18 %
Lymphs Abs: 0.7 10*3/uL (ref 0.7–4.0)
MCH: 28 pg (ref 26.0–34.0)
MCHC: 32.3 g/dL (ref 30.0–36.0)
MCV: 86.7 fL (ref 80.0–100.0)
Monocytes Absolute: 0.3 10*3/uL (ref 0.1–1.0)
Monocytes Relative: 8 %
Neutro Abs: 2.7 10*3/uL (ref 1.7–7.7)
Neutrophils Relative %: 70 %
Platelet Count: 125 10*3/uL — ABNORMAL LOW (ref 150–400)
RBC: 3.61 MIL/uL — ABNORMAL LOW (ref 3.87–5.11)
RDW: 14.7 % (ref 11.5–15.5)
WBC Count: 3.8 10*3/uL — ABNORMAL LOW (ref 4.0–10.5)
nRBC: 0 % (ref 0.0–0.2)

## 2018-11-19 LAB — CMP (CANCER CENTER ONLY)
ALT: 32 U/L (ref 0–44)
AST: 45 U/L — ABNORMAL HIGH (ref 15–41)
Albumin: 4.1 g/dL (ref 3.5–5.0)
Alkaline Phosphatase: 135 U/L — ABNORMAL HIGH (ref 38–126)
Anion gap: 12 (ref 5–15)
BUN: 10 mg/dL (ref 6–20)
CO2: 22 mmol/L (ref 22–32)
Calcium: 9.4 mg/dL (ref 8.9–10.3)
Chloride: 102 mmol/L (ref 98–111)
Creatinine: 0.78 mg/dL (ref 0.44–1.00)
GFR, Est AFR Am: >60 mL/min (ref >60)
GFR, Estimated: >60 mL/min (ref >60)
Glucose, Bld: 146 mg/dL — ABNORMAL HIGH (ref 70–99)
Potassium: 4.2 mmol/L (ref 3.5–5.1)
Sodium: 136 mmol/L (ref 135–145)
Total Bilirubin: 0.4 mg/dL (ref 0.3–1.2)
Total Protein: 7.3 g/dL (ref 6.5–8.1)

## 2018-11-19 LAB — LACTATE DEHYDROGENASE: LDH: 126 U/L (ref 98–192)

## 2018-11-20 LAB — KAPPA/LAMBDA LIGHT CHAINS
Kappa free light chain: 11.5 mg/L (ref 3.3–19.4)
Kappa, lambda light chain ratio: 0.02 — ABNORMAL LOW (ref 0.26–1.65)
Lambda free light chains: 577.7 mg/L — ABNORMAL HIGH (ref 5.7–26.3)

## 2018-11-20 LAB — IGG, IGA, IGM
IgA: 126 mg/dL (ref 87–352)
IgG (Immunoglobin G), Serum: 542 mg/dL — ABNORMAL LOW (ref 586–1602)
IgM (Immunoglobulin M), Srm: 438 mg/dL — ABNORMAL HIGH (ref 26–217)

## 2018-11-21 ENCOUNTER — Encounter: Payer: Self-pay | Admitting: Obstetrics & Gynecology

## 2018-11-21 DIAGNOSIS — Z1231 Encounter for screening mammogram for malignant neoplasm of breast: Secondary | ICD-10-CM | POA: Diagnosis not present

## 2018-11-21 LAB — BETA 2 MICROGLOBULIN, SERUM: Beta-2 Microglobulin: 2 mg/L (ref 0.6–2.4)

## 2018-11-26 ENCOUNTER — Encounter: Payer: Self-pay | Admitting: Internal Medicine

## 2018-11-26 ENCOUNTER — Inpatient Hospital Stay (HOSPITAL_BASED_OUTPATIENT_CLINIC_OR_DEPARTMENT_OTHER): Payer: BC Managed Care – PPO | Admitting: Internal Medicine

## 2018-11-26 ENCOUNTER — Other Ambulatory Visit: Payer: Self-pay

## 2018-11-26 VITALS — BP 129/67 | HR 63 | Temp 98.0°F | Resp 18 | Ht 67.0 in | Wt 248.0 lb

## 2018-11-26 DIAGNOSIS — C9000 Multiple myeloma not having achieved remission: Secondary | ICD-10-CM | POA: Diagnosis not present

## 2018-11-26 DIAGNOSIS — Z885 Allergy status to narcotic agent status: Secondary | ICD-10-CM | POA: Diagnosis not present

## 2018-11-26 DIAGNOSIS — M2550 Pain in unspecified joint: Secondary | ICD-10-CM | POA: Diagnosis not present

## 2018-11-26 DIAGNOSIS — E119 Type 2 diabetes mellitus without complications: Secondary | ICD-10-CM | POA: Diagnosis not present

## 2018-11-26 DIAGNOSIS — I10 Essential (primary) hypertension: Secondary | ICD-10-CM

## 2018-11-26 DIAGNOSIS — Z7984 Long term (current) use of oral hypoglycemic drugs: Secondary | ICD-10-CM | POA: Diagnosis not present

## 2018-11-26 DIAGNOSIS — R5383 Other fatigue: Secondary | ICD-10-CM | POA: Diagnosis not present

## 2018-11-26 DIAGNOSIS — Z79899 Other long term (current) drug therapy: Secondary | ICD-10-CM | POA: Diagnosis not present

## 2018-11-26 DIAGNOSIS — D472 Monoclonal gammopathy: Secondary | ICD-10-CM | POA: Diagnosis not present

## 2018-11-26 DIAGNOSIS — Z7901 Long term (current) use of anticoagulants: Secondary | ICD-10-CM | POA: Diagnosis not present

## 2018-11-26 DIAGNOSIS — Z880 Allergy status to penicillin: Secondary | ICD-10-CM | POA: Diagnosis not present

## 2018-11-26 NOTE — Progress Notes (Signed)
[name] [phone]   [phone]  OFFICE PROGRESS NOTE  Brenda Dickens, MD [place] [id number]  DIAGNOSIS: Plasma cell dyscrasia initially diagnosed as MGUS in September 2010, with additional symptoms suggestive of POEMS syndrome.   PRIOR THERAPY:  1) Velcade 1.3 MG/M2 subcutaneously with Decadron 40 mg by mouth on a weekly basis. First cycle 11/24/2013. She status post 31 weekly doses of treatment. 2) Velcade 1.3 MG/M2 subcutaneously and weekly basis with Decadron 40 mg by mouth weekly. First dose 02/01/2015. Status post 28 cycles. 3) Revlimid 25 mg by mouth daily for 21 days every 4 weeks with weekly Decadron 20 mg. started in 11/27/2015. Status post 3 cycles discontinued secondary to lack of response.  CURRENT THERAPY: Systemic treatment with Velcade 1.3 MG/KG weekly, Revlimid 25 mg by mouth daily for 21 days every 4 weeks in addition to Decadron 20 mg by mouth weekly. First dose 03/06/2016. Status post 30 cycles.  Her treatment is currently on hold.  INTERVAL HISTORY: Brenda Conner 58 y.o. female returns to the clinic today for 3 months follow-up visit.  The patient is feeling fine today with no concerning complaints.  She denied having any current chest pain, shortness of breath, cough or hemoptysis.  She has no recent weight loss or night sweats.  She has no nausea, vomiting, diarrhea or constipation.  She has no headache or visual changes.  She enjoyed her time of the treatment for the last 3 months.  The patient is here today for evaluation with repeat myeloma panel.   MEDICAL HISTORY: Past Medical History:  Diagnosis Date  . Acid reflux   . Anxiety   . Asthma   . Cancer [facility])    waldenstroms/ macroglobinulemia  . Depression   . Depression   . Diabetes mellitus without complication ([place])   . Dysuria 02/28/2016  . Hypercholesteremia   . Hypertension   . Hypothyroidism   . Macroglobulinemia ([place])    ? POEMS syndrome  . Multiple myeloma ([place])   . Obesity   . PONV (postoperative nausea and vomiting)   . Sleep apnea    CPAP at bedtime  . Sleep apnea     ALLERGIES:  is allergic to codeine; hydrocodone; lortab [hydrocodone-acetaminophen]; onion; shellfish allergy; and amoxicillin.  MEDICATIONS:  Current Outpatient Medications  Medication Sig Dispense Refill  . acyclovir (ZOVIRAX) 400 MG tablet Take 1 tablet (400 mg total) by mouth 2 (two) times daily. 180 tablet 1  . ALPRAZolam (XANAX) 0.5 MG tablet Take 1 tablet (0.5 mg total) by mouth at bedtime as needed for anxiety. 30 tablet 0  . Azelastine HCl 0.15 % SOLN as needed.    . Blood Glucose Monitoring Suppl (ONE TOUCH ULTRA MINI) W/DEVICE KIT See admin instructions. Reported on 01/25/2015  0  . Cetirizine HCl (ZYRTEC ALLERGY PO) Take by mouth daily.    [name] dexamethasone (DECADRON) 4 MG tablet TAKE 1 TABLET (4 MG TOTAL) BY MOUTH ONCE A WEEK. PATIENT TAKES 5 TABLETS 1 DAY WEEK PRIOR TO VELCADE APPOINTMENT. 40 tablet 1  . DiphenhydrAMINE HCl (BENADRYL ALLERGY PO) Take by mouth as needed.    [name] EPIPEN 2-PAK 0.3 MG/0.3ML SOAJ injection Reported on 06/21/2015  1  . Fexofenadine HCl (ALLEGRA ALLERGY PO) Take by mouth daily.    . Fluticasone-Salmeterol (ADVAIR) 100-50 MCG/DOSE AEPB Inhale 2 puffs into the lungs every 12 (twelve) hours.    [name] ibuprofen (ADVIL,MOTRIN) 100 MG  tablet Take 100 mg by mouth every 6 (six) hours as needed. Reported on 05/31/2015    . lenalidomide (REVLIMID) 25 MG capsule TAKE 1 CAPSULE BY MOUTH ONCE DAILY 21 DAYS ON AND 7 DAYS OFF Authorization # [id number] 08/22/18 21 capsule 0  . levothyroxine (SYNTHROID, LEVOTHROID) 150 MCG tablet Take 150 mcg by mouth daily before breakfast.    . lisinopril (PRINIVIL,ZESTRIL) 10 MG tablet Take 10 mg by mouth daily. auth number 05/29/2018 [id number]  3  . loperamide (IMODIUM) 2 MG capsule Take 2 mg by mouth as needed for diarrhea or loose stools.    . magic mouthwash w/lidocaine SOLN Take 5 mLs by  mouth 4 (four) times daily as needed for mouth pain. Swish, Gargle, and spit 240 mL 1  . metFORMIN (GLUCOPHAGE-XR) 500 MG 24 hr tablet Take 500 mg by mouth daily.    [name] omeprazole (PRILOSEC) 40 MG capsule Take 40 mg by mouth 2 (two) times daily.     . ondansetron (ZOFRAN-ODT) 8 MG disintegrating tablet Take 1 tablet (8 mg total) by mouth every 8 (eight) hours as needed. Reported on 05/31/2015 20 tablet 2  . pravastatin (PRAVACHOL) 40 MG tablet Take 40 mg by mouth every evening.     [name] PROAIR HFA 108 (90 Base) MCG/ACT inhaler Reported on 06/21/2015  1  . sertraline (ZOLOFT) 50 MG tablet Take 3 tablets (150 mg total) by mouth daily. 270 tablet 2  . verapamil (CALAN-SR) 240 MG CR tablet Take 240 mg by mouth 2 (two) times daily.    [name] warfarin (COUMADIN) 2 MG tablet TAKE 1 TABLET BY MOUTH EVERY DAY 90 tablet 0   No current facility-administered medications for this visit.     SURGICAL HISTORY:  Past Surgical History:  Procedure Laterality Date  . ABLATION  09/2007   HTA and polyp resection  . BACK SURGERY  03/2004   herniation, L4-L5  . Bil Laprascopic knee surgery    . BONE MARROW BIOPSY     2011  . BONE MARROW BIOPSY  4/14  . CHOLECYSTECTOMY    . FOOT SURGERY  1998  . KNEE ARTHROSCOPY W/ MENISCAL REPAIR  10/10 ; 3/11  . LAPAROSCOPIC CHOLECYSTECTOMY  1997  . NASAL SINUS SURGERY  2004  . UTERINE FIBROID EMBOLIZATION      REVIEW OF SYSTEMS:  A comprehensive review of systems was negative except for: Constitutional: positive for fatigue Musculoskeletal: positive for arthralgias   PHYSICAL EXAMINATION: General appearance: alert, cooperative, fatigued and no distress Head: Normocephalic, without obvious abnormality, atraumatic Neck: no adenopathy Lymph nodes: Cervical, supraclavicular, and axillary nodes normal. Resp: clear to auscultation bilaterally Back: symmetric, no curvature. ROM normal. No CVA tenderness. Cardio: regular rate and rhythm, S1, S2 normal, no murmur, click, rub or gallop  GI: soft, non-tender; bowel sounds normal; no masses,  no organomegaly Extremities: extremities normal, atraumatic, no cyanosis or edema  ECOG PERFORMANCE STATUS: 1 - Symptomatic but completely ambulatory  Blood pressure 129/67, pulse 63, temperature 98 F (36.7 C), temperature source Oral, resp. rate 18, height 5' 7"  (1.702 m), weight 248 lb (112.5 kg), SpO2 100 %.  LABORATORY DATA: Lab Results  Component Value Date   WBC 3.8 (L) 11/19/2018   HGB 10.1 (L) 11/19/2018   HCT 31.3 (L) 11/19/2018   MCV 86.7 11/19/2018   PLT 125 (L) 11/19/2018      Chemistry      Component Value Date/Time   NA 136 11/19/2018 0907   NA 138 11/14/2016 0824   K  4.2 11/19/2018 0907   K 4.3 11/14/2016 0824   CL 102 11/19/2018 0907   CL 104 04/07/2012 1415   CO2 22 11/19/2018 0907   CO2 23 11/14/2016 0824   BUN 10 11/19/2018 0907   BUN 9.1 11/14/2016 0824   CREATININE 0.78 11/19/2018 0907   CREATININE 0.7 11/14/2016 0824      Component Value Date/Time   CALCIUM 9.4 11/19/2018 0907   CALCIUM 9.6 11/14/2016 0824   ALKPHOS 135 (H) 11/19/2018 0907   ALKPHOS 94 11/14/2016 0824   AST 45 (H) 11/19/2018 0907   AST 28 11/14/2016 0824   ALT 32 11/19/2018 0907   ALT 24 11/14/2016 0824   BILITOT 0.4 11/19/2018 0907   BILITOT 0.41 11/14/2016 0824      ASSESSMENT AND PLAN:  This is a very pleasant 58 years old white female with multiple myeloma status post several chemotherapy regimens. The patient has been on treatment with subcutaneous weekly Velcade as well as Revlimid and DecadronStatus post 30 cycles.  The patient has been tolerating this treatment well except for fatigue.  She has been in observation for the last 3 months and feeling much better. She had repeat myeloma panel that showed increase and the free lambda light chain but the patient is currently asymptomatic and has no other significant abnormalities. I give her the option of resuming her treatment versus taking another break of treatment  during the holiday.  The patient would like to have some time off during the holiday.  I will see her back for follow-up visit in 3 months with repeat myeloma panel. For the hypertension and diabetes mellitus, she will continue with her current home medication. The patient was advised to call immediately if she has any concerning symptoms in the interval.  All questions were answered. The patient knows to call the clinic with any problems, questions or concerns. We can certainly see the patient much sooner if necessary.  Disclaimer: This note was dictated with voice recognition software. Similar sounding words can inadvertently be transcribed and may not be corrected upon review.

## 2018-11-27 ENCOUNTER — Telehealth: Payer: Self-pay | Admitting: Internal Medicine

## 2018-11-27 NOTE — Telephone Encounter (Signed)
Scheduled per los. Called and left msg. Mailed printout  °

## 2018-12-02 ENCOUNTER — Other Ambulatory Visit: Payer: Self-pay | Admitting: Medical Oncology

## 2018-12-02 ENCOUNTER — Encounter: Payer: Self-pay | Admitting: Internal Medicine

## 2018-12-02 DIAGNOSIS — C9000 Multiple myeloma not having achieved remission: Secondary | ICD-10-CM

## 2018-12-02 MED ORDER — ACYCLOVIR 400 MG PO TABS: 400.0000 mg | ORAL_TABLET | Freq: Two times a day (BID) | ORAL | 1 refills | Status: DC

## 2018-12-02 NOTE — Telephone Encounter (Signed)
refill 

## 2018-12-11 NOTE — Progress Notes (Signed)
58 y.o. G0P0000 Single White or Caucasian female here for annual exam.  Patient states that she is on a break from chemotherapy but will more than likely start back in February.  Her lamba and kappa chains levels have started to increase.  The chemotherapy has been controlling her multiple myeloma.  She is still working full time.  Chemotherapy has caused her hair to straighten.   Denies vaginal bleeding.    No LMP recorded. Patient has had an ablation.          Sexually active: No.  The current method of family planning is post menopausal status.    Exercising: No.  The patient does not participate in regular exercise at present. Smoker:  no  Health Maintenance: Pap:   06/05/16 Neg               02/18/15 neg. HR HPV:neg  History of abnormal Pap:  no MMG:  11/18/17 Bi-rads 1 neg.   Colonoscopy:   09/06/08 Normal  BMD:   09/19/16 Normal  TDaP:  2016 Pneumonia vaccine(s):  No  Shingrix:   no Hep C testing: neg  Screening Labs: PCP   reports that she has never smoked. She has never used smokeless tobacco. She reports that she does not drink alcohol or use drugs.  Past Medical History:  Diagnosis Date  . Acid reflux   . Anxiety   . Asthma   . Cancer [facility])    waldenstroms/ macroglobinulemia  . Depression   . Depression   . Diabetes mellitus without complication ([place])   . Dysuria 02/28/2016  . Hypercholesteremia   . Hypertension   . Hypothyroidism   . Macroglobulinemia ([place])    ? POEMS syndrome  . Multiple myeloma ([place])   . Obesity   . PONV (postoperative nausea and vomiting)   . Sleep apnea    CPAP at bedtime  . Sleep apnea     Past Surgical History:  Procedure Laterality Date  . ABLATION  09/2007   HTA and polyp resection  . BACK SURGERY  03/2004   herniation, L4-L5  . Bil Laprascopic knee surgery    . BONE MARROW BIOPSY     2011  . BONE MARROW BIOPSY  4/14  . CHOLECYSTECTOMY    . FOOT SURGERY  1998  . KNEE ARTHROSCOPY W/ MENISCAL REPAIR  10/10 ; 3/11  . LAPAROSCOPIC  CHOLECYSTECTOMY  1997  . NASAL SINUS SURGERY  2004  . UTERINE FIBROID EMBOLIZATION      Current Outpatient Medications  Medication Sig Dispense Refill  . acyclovir (ZOVIRAX) 400 MG tablet Take 1 tablet (400 mg total) by mouth 2 (two) times daily. 180 tablet 1  . ALPRAZolam (XANAX) 0.5 MG tablet Take 1 tablet (0.5 mg total) by mouth at bedtime as needed for anxiety. 30 tablet 0  . Azelastine HCl 0.15 % SOLN as needed.    . Blood Glucose Monitoring Suppl (ONE TOUCH ULTRA MINI) W/DEVICE KIT See admin instructions. Reported on 01/25/2015  0  . Cetirizine HCl (ZYRTEC ALLERGY PO) Take by mouth daily.    [name] dexamethasone (DECADRON) 4 MG tablet TAKE 1 TABLET (4 MG TOTAL) BY MOUTH ONCE A WEEK. PATIENT TAKES 5 TABLETS 1 DAY WEEK PRIOR TO VELCADE APPOINTMENT. 40 tablet 1  . DiphenhydrAMINE HCl (BENADRYL ALLERGY PO) Take by mouth as needed.    [name] EPIPEN 2-PAK 0.3 MG/0.3ML SOAJ injection Reported on 06/21/2015  1  . Fexofenadine HCl (ALLEGRA ALLERGY PO) Take by mouth daily.    [name]  Fluticasone-Salmeterol (ADVAIR) 100-50 MCG/DOSE AEPB Inhale 2 puffs into the lungs every 12 (twelve) hours.    [name] ibuprofen (ADVIL,MOTRIN) 100 MG tablet Take 100 mg by mouth every 6 (six) hours as needed. Reported on 05/31/2015    . lenalidomide (REVLIMID) 25 MG capsule TAKE 1 CAPSULE BY MOUTH ONCE DAILY 21 DAYS ON AND 7 DAYS OFF Authorization # [id number] 08/22/18 21 capsule 0  . levothyroxine (SYNTHROID, LEVOTHROID) 150 MCG tablet Take 150 mcg by mouth daily before breakfast.    . lisinopril (PRINIVIL,ZESTRIL) 10 MG tablet Take 10 mg by mouth daily. auth number 05/29/2018 [id number]  3  . loperamide (IMODIUM) 2 MG capsule Take 2 mg by mouth as needed for diarrhea or loose stools.    . magic mouthwash w/lidocaine SOLN Take 5 mLs by mouth 4 (four) times daily as needed for mouth pain. Swish, Gargle, and spit 240 mL 1  . metFORMIN (GLUCOPHAGE-XR) 500 MG 24 hr tablet Take 500 mg by mouth daily.    [name] omeprazole (PRILOSEC) 40 MG capsule Take 40 mg  by mouth 2 (two) times daily.     . ondansetron (ZOFRAN-ODT) 8 MG disintegrating tablet Take 1 tablet (8 mg total) by mouth every 8 (eight) hours as needed. Reported on 05/31/2015 20 tablet 2  . pravastatin (PRAVACHOL) 40 MG tablet Take 40 mg by mouth every evening.     [name] PROAIR HFA 108 (90 Base) MCG/ACT inhaler Reported on 06/21/2015  1  . sertraline (ZOLOFT) 50 MG tablet Take 3 tablets (150 mg total) by mouth daily. 270 tablet 2  . verapamil (CALAN-SR) 240 MG CR tablet Take 240 mg by mouth 2 (two) times daily.    [name] warfarin (COUMADIN) 2 MG tablet TAKE 1 TABLET BY MOUTH EVERY DAY 90 tablet 0   No current facility-administered medications for this visit.     Family History  Problem Relation Age of Onset  . Heart disease Mother        heart cath  . Asthma Mother   . Endometriosis Mother     Review of Systems  All other systems reviewed and are negative.   Exam:   Vitals:   12/19/18 0825  BP: (!) 142/72  Pulse: 81  Temp: (!) 97.4 F (36.3 C)  SpO2: 95%    General appearance: alert, cooperative and appears stated age Head: Normocephalic, without obvious abnormality, atraumatic Neck: no adenopathy, supple, symmetrical, trachea midline and thyroid normal to inspection and palpation Lungs: clear to auscultation bilaterally Breasts: normal appearance, no masses or tenderness Heart: regular rate and rhythm Abdomen: soft, non-tender; bowel sounds normal; no masses,  no organomegaly Extremities: extremities normal, atraumatic, no cyanosis or edema Skin: Skin color, texture, turgor normal. No rashes or lesions Lymph nodes: Cervical, supraclavicular, and axillary nodes normal. No abnormal inguinal nodes palpated Neurologic: Grossly normal   Pelvic: External genitalia:  no lesions              Urethra:  normal appearing urethra with no masses, tenderness or lesions              Bartholins and Skenes: normal                 Vagina: normal appearing vagina with normal color and  discharge, no lesions              Cervix: no lesions              Pap taken: No. Bimanual Exam:  Uterus:  normal size, contour,  position, consistency, mobility, non-tender              Adnexa: normal adnexa and no mass, fullness, tenderness               Rectovaginal: Confirms               Anus:  normal sphincter tone, no lesions  Chaperone was present for exam.  A:  Well Woman with normal exam PMP, no HRT H/O HTA 9/09 Elevated lipid Multiple myeloma followed by Dr. Julien Nordmann Hypertension Anxiety Anemia due to treatment  P:   Mammogram guidelines reviewed.  Will call for copy of MMG from [place]. pap smear with neg HR HPV 2017.  New ASCCP guidelines for Pap reviewed.  Will plan to repeat in 2022 Cologuard order for her Lab work UTD Vaccines UTD.  Shingrix vaccination discussed. Rx for Xanax 0.57m po qday prn anxiety.  She uses typically less than one a week.  #30/1RF.   Return annually or prn

## 2018-12-18 ENCOUNTER — Other Ambulatory Visit: Payer: Self-pay

## 2018-12-19 ENCOUNTER — Ambulatory Visit: Payer: BC Managed Care – PPO | Admitting: Obstetrics & Gynecology

## 2018-12-19 ENCOUNTER — Encounter: Payer: Self-pay | Admitting: Obstetrics & Gynecology

## 2018-12-19 VITALS — BP 142/72 | HR 81 | Temp 97.4°F | Ht 67.0 in | Wt 253.0 lb

## 2018-12-19 DIAGNOSIS — Z01411 Encounter for gynecological examination (general) (routine) with abnormal findings: Secondary | ICD-10-CM | POA: Diagnosis not present

## 2018-12-19 MED ORDER — ALPRAZOLAM 0.5 MG PO TABS: 0.5000 mg | ORAL_TABLET | Freq: Every evening | ORAL | 1 refills | Status: DC | PRN

## 2018-12-19 NOTE — Patient Instructions (Signed)
Outpatient Pharmacy at [facility],  [place]  [address]  Main: [phone]  Sunday:Closed Monday:7:30 AM - 6:00 PM Tuesday:7:30 AM - 6:00 PM Wednesday:7:30 AM - 6:00 PM Thursday:7:30 AM - 6:00 PM Friday:7:30 AM - 6:00 PM Saturday:Closed

## 2018-12-22 ENCOUNTER — Telehealth: Payer: Self-pay | Admitting: *Deleted

## 2018-12-22 ENCOUNTER — Encounter: Payer: Self-pay | Admitting: Internal Medicine

## 2018-12-22 NOTE — Telephone Encounter (Signed)
Cologuard order signed  faxed to [employer].   Call to patient to notify, left detailed message, ok per dpr. Name identified on voicemail. Advised patient to contact her insurance company regarding coverage. [employer] will contact her within the next 2 wks to review Cologuard process. Our office will f/u with results once completed. Return call to office if any additional questions.    Routing to Dr. Lestine Box.   Encounter closed.

## 2018-12-23 ENCOUNTER — Other Ambulatory Visit: Payer: Self-pay | Admitting: Obstetrics and Gynecology

## 2018-12-23 NOTE — Telephone Encounter (Signed)
Medication refill request: Zoloft  Last AEX:  12/19/18 Next AEX: 03/04/20 Last MMG (if hormonal medication request): 11/18/17 Vi-rads 1 neg  Refill authorized: #270 with 2 refills

## 2018-12-26 ENCOUNTER — Encounter: Payer: Self-pay | Admitting: Obstetrics & Gynecology

## 2018-12-26 NOTE — Telephone Encounter (Signed)
Patient sent the following correspondence through [place].  Dr. Sabra Heck,  Would you please refill my prescription for sertraline?  [facility]  said [facility] are no refills left, but MyChart says there are 2 left.  [facility] a [facility] - but I think they are emailing the wrong doctor as it says [facility].  I take 3 - 50 mg tablets daily.  Thank you so much!    Merry Christmas! Anderson Malta

## 2018-12-26 NOTE — Telephone Encounter (Signed)
Medication refill request: Zoloft Last AEX:  12-19-2018 SM Next AEX: 03-04-20 Last MMG (if hormonal medication request): N/A Refill authorized: Today, please advise.   Medication pended for #270, 3RF. Please refill if appropriate.

## 2019-01-21 ENCOUNTER — Encounter: Payer: Self-pay | Admitting: Obstetrics & Gynecology

## 2019-01-21 DIAGNOSIS — Z1211 Encounter for screening for malignant neoplasm of colon: Secondary | ICD-10-CM | POA: Diagnosis not present

## 2019-01-21 DIAGNOSIS — Z1212 Encounter for screening for malignant neoplasm of rectum: Secondary | ICD-10-CM | POA: Diagnosis not present

## 2019-01-21 LAB — COLOGUARD: Cologuard: NEGATIVE

## 2019-01-27 ENCOUNTER — Telehealth: Payer: Self-pay

## 2019-01-27 NOTE — Telephone Encounter (Signed)
Patient has been advised of negative Cologuard results. Okay to close encounter.

## 2019-01-28 ENCOUNTER — Other Ambulatory Visit: Payer: Self-pay | Admitting: Internal Medicine

## 2019-01-28 DIAGNOSIS — C9000 Multiple myeloma not having achieved remission: Secondary | ICD-10-CM

## 2019-02-19 ENCOUNTER — Other Ambulatory Visit: Payer: Self-pay

## 2019-02-19 ENCOUNTER — Inpatient Hospital Stay: Payer: BC Managed Care – PPO | Attending: Internal Medicine

## 2019-02-19 DIAGNOSIS — C9000 Multiple myeloma not having achieved remission: Secondary | ICD-10-CM | POA: Diagnosis not present

## 2019-02-19 LAB — CBC WITH DIFFERENTIAL (CANCER CENTER ONLY)
Abs Immature Granulocytes: 0.02 10*3/uL (ref 0.00–0.07)
Basophils Absolute: 0 10*3/uL (ref 0.0–0.1)
Basophils Relative: 1 %
Eosinophils Absolute: 0 10*3/uL (ref 0.0–0.5)
Eosinophils Relative: 1 %
HCT: 31.9 % — ABNORMAL LOW (ref 36.0–46.0)
Hemoglobin: 10.1 g/dL — ABNORMAL LOW (ref 12.0–15.0)
Immature Granulocytes: 1 %
Lymphocytes Relative: 18 %
Lymphs Abs: 0.8 10*3/uL (ref 0.7–4.0)
MCH: 26.7 pg (ref 26.0–34.0)
MCHC: 31.7 g/dL (ref 30.0–36.0)
MCV: 84.4 fL (ref 80.0–100.0)
Monocytes Absolute: 0.3 10*3/uL (ref 0.1–1.0)
Monocytes Relative: 8 %
Neutro Abs: 3 10*3/uL (ref 1.7–7.7)
Neutrophils Relative %: 71 %
Platelet Count: 137 10*3/uL — ABNORMAL LOW (ref 150–400)
RBC: 3.78 MIL/uL — ABNORMAL LOW (ref 3.87–5.11)
RDW: 14.2 % (ref 11.5–15.5)
WBC Count: 4.1 10*3/uL (ref 4.0–10.5)
nRBC: 0 % (ref 0.0–0.2)

## 2019-02-19 LAB — CMP (CANCER CENTER ONLY)
ALT: 48 U/L — ABNORMAL HIGH (ref 0–44)
AST: 56 U/L — ABNORMAL HIGH (ref 15–41)
Albumin: 4.2 g/dL (ref 3.5–5.0)
Alkaline Phosphatase: 156 U/L — ABNORMAL HIGH (ref 38–126)
Anion gap: 12 (ref 5–15)
BUN: 13 mg/dL (ref 6–20)
CO2: 23 mmol/L (ref 22–32)
Calcium: 9.3 mg/dL (ref 8.9–10.3)
Chloride: 98 mmol/L (ref 98–111)
Creatinine: 0.9 mg/dL (ref 0.44–1.00)
GFR, Est AFR Am: >60 mL/min (ref >60)
GFR, Estimated: >60 mL/min (ref >60)
Glucose, Bld: 259 mg/dL — ABNORMAL HIGH (ref 70–99)
Potassium: 4.5 mmol/L (ref 3.5–5.1)
Sodium: 133 mmol/L — ABNORMAL LOW (ref 135–145)
Total Bilirubin: 0.5 mg/dL (ref 0.3–1.2)
Total Protein: 7.7 g/dL (ref 6.5–8.1)

## 2019-02-19 LAB — LACTATE DEHYDROGENASE: LDH: 146 U/L (ref 98–192)

## 2019-02-20 LAB — IGG, IGA, IGM
IgA: 133 mg/dL (ref 87–352)
IgG (Immunoglobin G), Serum: 537 mg/dL — ABNORMAL LOW (ref 586–1602)
IgM (Immunoglobulin M), Srm: 542 mg/dL — ABNORMAL HIGH (ref 26–217)

## 2019-02-20 LAB — BETA 2 MICROGLOBULIN, SERUM: Beta-2 Microglobulin: 1.7 mg/L (ref 0.6–2.4)

## 2019-02-20 LAB — KAPPA/LAMBDA LIGHT CHAINS
Kappa free light chain: 10.4 mg/L (ref 3.3–19.4)
Kappa, lambda light chain ratio: 0.01 — ABNORMAL LOW (ref 0.26–1.65)
Lambda free light chains: 804.4 mg/L — ABNORMAL HIGH (ref 5.7–26.3)

## 2019-02-26 ENCOUNTER — Inpatient Hospital Stay: Payer: BC Managed Care – PPO | Admitting: Internal Medicine

## 2019-02-26 ENCOUNTER — Encounter: Payer: Self-pay | Admitting: Internal Medicine

## 2019-02-27 ENCOUNTER — Telehealth: Payer: Self-pay | Admitting: Internal Medicine

## 2019-02-27 NOTE — Telephone Encounter (Signed)
R/s appt per 2/18 sch message - unable to reach pt . Left message with appt date and time

## 2019-03-02 ENCOUNTER — Other Ambulatory Visit: Payer: Self-pay

## 2019-03-02 ENCOUNTER — Inpatient Hospital Stay: Payer: BC Managed Care – PPO | Admitting: Internal Medicine

## 2019-03-02 ENCOUNTER — Encounter: Payer: Self-pay | Admitting: Internal Medicine

## 2019-03-02 VITALS — BP 137/64 | HR 82 | Temp 97.9°F | Resp 18 | Ht 67.0 in | Wt 253.4 lb

## 2019-03-02 DIAGNOSIS — C9000 Multiple myeloma not having achieved remission: Secondary | ICD-10-CM

## 2019-03-02 DIAGNOSIS — I10 Essential (primary) hypertension: Secondary | ICD-10-CM | POA: Diagnosis not present

## 2019-03-02 DIAGNOSIS — Z5111 Encounter for antineoplastic chemotherapy: Secondary | ICD-10-CM

## 2019-03-02 NOTE — Progress Notes (Signed)
[facility] [phone]   Fax:([phone]  OFFICE PROGRESS NOTE  Brenda Dickens, MD [place] [id number]  DIAGNOSIS: Plasma cell dyscrasia initially diagnosed as MGUS in September 2010, with additional symptoms suggestive of POEMS syndrome.   PRIOR THERAPY:  1) Velcade 1.3 MG/M2 subcutaneously with Decadron 40 mg by mouth on a weekly basis. First cycle 11/24/2013. She status post 31 weekly doses of treatment. 2) Velcade 1.3 MG/M2 subcutaneously and weekly basis with Decadron 40 mg by mouth weekly. First dose 02/01/2015. Status post 28 cycles. 3) Revlimid 25 mg by mouth daily for 21 days every 4 weeks with weekly Decadron 20 mg. started in 11/27/2015. Status post 3 cycles discontinued secondary to lack of response.  CURRENT THERAPY: Systemic treatment with Velcade 1.3 MG/KG weekly, Revlimid 25 mg by mouth daily for 21 days every 4 weeks in addition to Decadron 20 mg by mouth weekly. First dose 03/06/2016. Status post 30 cycles.  Her treatment was on hold for the last few months.  She will resume her treatment March 17, 2019.  INTERVAL HISTORY: Brenda Conner 59 y.o. female returns to the clinic today for follow-up visit.  The patient is complaining of increasing fatigue and weakness as well as myalgia.  She denied having any current chest pain, shortness of breath, cough or hemoptysis.  She denied having any fever or chills.  She has no nausea, vomiting, diarrhea or constipation.  She has no headache or visual changes.  She has no recent weight loss or night sweats.  She has been off treatment for several months.  She had repeat myeloma panel performed recently and she is here for evaluation and discussion of her lab results and recommendation regarding treatment of her condition. Brenda Conner   MEDICAL HISTORY: Past Medical History:  Diagnosis Date  . Acid reflux   . Anxiety   . Asthma   . Cancer [facility])    waldenstroms/  macroglobinulemia  . Depression   . Depression   . Diabetes mellitus without complication ([place])   . Dysuria 02/28/2016  . Hypercholesteremia   . Hypertension   . Hypothyroidism   . Macroglobulinemia ([place])    ? POEMS syndrome  . Multiple myeloma ([place])   . Obesity   . PONV (postoperative nausea and vomiting)   . Sleep apnea    CPAP at bedtime  . Sleep apnea     ALLERGIES:  is allergic to codeine; hydrocodone; lortab [hydrocodone-acetaminophen]; onion; shellfish allergy; and amoxicillin.  MEDICATIONS:  Current Outpatient Medications  Medication Sig Dispense Refill  . sertraline (ZOLOFT) 50 MG tablet TAKE 3 TABLETS BY MOUTH EVERY DAY 270 tablet 3  . ACCU-CHEK AVIVA PLUS test strip USE TO CHECK SUGAR TWICE A DAY 90    . acyclovir (ZOVIRAX) 400 MG tablet Take 1 tablet (400 mg total) by mouth 2 (two) times daily. 180 tablet 1  . ALPRAZolam (XANAX) 0.5 MG tablet Take 1 tablet (0.5 mg total) by mouth at bedtime as needed for anxiety. 30 tablet 1  . Azelastine HCl 0.15 % SOLN as needed.    . B-D UF III MINI PEN NEEDLES 31G X 5 MM MISC     . Blood Glucose Monitoring Suppl (ONE TOUCH ULTRA MINI) W/DEVICE KIT See admin instructions. Reported on 01/25/2015  0  . Cetirizine HCl (ZYRTEC ALLERGY PO) Take by mouth daily.    Brenda Conner dexamethasone (DECADRON) 4 MG tablet TAKE 1 TABLET (4 MG  TOTAL) BY MOUTH ONCE A WEEK. PATIENT TAKES 5 TABLETS 1 DAY WEEK PRIOR TO VELCADE APPOINTMENT. 40 tablet 1  . DiphenhydrAMINE HCl (BENADRYL ALLERGY PO) Take by mouth as needed.    Brenda Conner EPIPEN 2-PAK 0.3 MG/0.3ML SOAJ injection Reported on 06/21/2015  1  . Fexofenadine HCl (ALLEGRA ALLERGY PO) Take by mouth daily.    . Fluticasone-Salmeterol (ADVAIR) 100-50 MCG/DOSE AEPB Inhale 2 puffs into the lungs every 12 (twelve) hours.    Brenda Conner ibuprofen (ADVIL,MOTRIN) 100 MG tablet Take 100 mg by mouth every 6 (six) hours as needed. Reported on 05/31/2015    . lenalidomide (REVLIMID) 25 MG capsule TAKE 1 CAPSULE BY MOUTH ONCE DAILY 21 DAYS ON  AND 7 DAYS OFF Authorization # [id number] 08/22/18 (Patient not taking: Reported on 12/19/2018) 21 capsule 0  . levothyroxine (SYNTHROID, LEVOTHROID) 150 MCG tablet Take 150 mcg by mouth daily before breakfast.    . lisinopril (PRINIVIL,ZESTRIL) 10 MG tablet Take 10 mg by mouth daily. auth number 05/29/2018 [id number]  3  . loperamide (IMODIUM) 2 MG capsule Take 2 mg by mouth as needed for diarrhea or loose stools.    . magic mouthwash w/lidocaine SOLN Take 5 mLs by mouth 4 (four) times daily as needed for mouth pain. Swish, Gargle, and spit 240 mL 1  . metFORMIN (GLUCOPHAGE-XR) 500 MG 24 hr tablet Take 500 mg by mouth daily.    Brenda Conner omeprazole (PRILOSEC) 40 MG capsule Take 40 mg by mouth 2 (two) times daily.     . ondansetron (ZOFRAN-ODT) 8 MG disintegrating tablet Take 1 tablet (8 mg total) by mouth every 8 (eight) hours as needed. Reported on 05/31/2015 20 tablet 2  . pravastatin (PRAVACHOL) 40 MG tablet Take 40 mg by mouth every evening.     Brenda Conner PROAIR HFA 108 (90 Base) MCG/ACT inhaler Reported on 06/21/2015  1  . verapamil (CALAN-SR) 240 MG CR tablet Take 240 mg by mouth 2 (two) times daily.    Brenda Conner warfarin (COUMADIN) 2 MG tablet TAKE 1 TABLET BY MOUTH EVERY DAY 90 tablet 0   No current facility-administered medications for this visit.    SURGICAL HISTORY:  Past Surgical History:  Procedure Laterality Date  . ABLATION  09/2007   HTA and polyp resection  . BACK SURGERY  03/2004   herniation, L4-L5  . Bil Laprascopic knee surgery    . BONE MARROW BIOPSY     2011  . BONE MARROW BIOPSY  4/14  . CHOLECYSTECTOMY    . FOOT SURGERY  1998  . KNEE ARTHROSCOPY W/ MENISCAL REPAIR  10/10 ; 3/11  . LAPAROSCOPIC CHOLECYSTECTOMY  1997  . NASAL SINUS SURGERY  2004  . UTERINE FIBROID EMBOLIZATION      REVIEW OF SYSTEMS:  Constitutional: positive for fatigue Eyes: negative Ears, nose, mouth, throat, and face: negative Respiratory: negative Cardiovascular: negative Gastrointestinal: negative  Genitourinary:negative Integument/breast: negative Hematologic/lymphatic: negative Musculoskeletal:positive for myalgias Neurological: positive for paresthesia Behavioral/Psych: negative Endocrine: negative Allergic/Immunologic: negative   PHYSICAL EXAMINATION: General appearance: alert, cooperative, fatigued and no distress Head: Normocephalic, without obvious abnormality, atraumatic Neck: no adenopathy Lymph nodes: Cervical, supraclavicular, and axillary nodes normal. Resp: clear to auscultation bilaterally Back: symmetric, no curvature. ROM normal. No CVA tenderness. Cardio: regular rate and rhythm, S1, S2 normal, no murmur, click, rub or gallop GI: soft, non-tender; bowel sounds normal; no masses,  no organomegaly Extremities: extremities normal, atraumatic, no cyanosis or edema Neurologic: Alert and oriented X 3, normal strength and tone. Normal symmetric reflexes. Normal coordination and  gait  ECOG PERFORMANCE STATUS: 1 - Symptomatic but completely ambulatory  Blood pressure 137/64, pulse 82, temperature 97.9 F (36.6 C), temperature source Temporal, resp. rate 18, height _0  (1.702 m), weight 253 lb 6.4 oz (114.9 kg), SpO2 100 %.  LABORATORY DATA: Lab Results  Component Value Date   WBC 4.1 02/19/2019   HGB 10.1 (L) 02/19/2019   HCT 31.9 (L) 02/19/2019   MCV 84.4 02/19/2019   PLT 137 (L) 02/19/2019      Chemistry      Component Value Date/Time   NA 133 (L) 02/19/2019 1124   NA 138 11/14/2016 0824   K 4.5 02/19/2019 1124   K 4.3 11/14/2016 0824   CL 98 02/19/2019 1124   CL 104 04/07/2012 1415   CO2 23 02/19/2019 1124   CO2 23 11/14/2016 0824   BUN 13 02/19/2019 1124   BUN 9.1 11/14/2016 0824   CREATININE 0.90 02/19/2019 1124   CREATININE 0.7 11/14/2016 0824      Component Value Date/Time   CALCIUM 9.3 02/19/2019 1124   CALCIUM 9.6 11/14/2016 0824   ALKPHOS 156 (H) 02/19/2019 1124   ALKPHOS 94 11/14/2016 0824   AST 56 (H) 02/19/2019 1124   AST 28  11/14/2016 0824   ALT 48 (H) 02/19/2019 1124   ALT 24 11/14/2016 0824   BILITOT 0.5 02/19/2019 1124   BILITOT 0.41 11/14/2016 0824      ASSESSMENT AND PLAN:  This is a very pleasant 59 years old white female with multiple myeloma status post several chemotherapy regimens. The patient has been on treatment with subcutaneous weekly Velcade as well as Revlimid and Decadron status post 30 cycles.  The patient has been tolerating this treatment well except for fatigue.   She has been in observation for the last 6 months.  The patient is complaining today of increasing fatigue and weakness as well as myalgia and paresthesia. She had repeat myeloma panel that showed evidence for progression with increase in the free lambda light chain. I recommended for the patient to resume her treatment with weekly subcutaneous Velcade in addition to the Revlimid and Decadron again.  She will start the next cycle of her treatment on March 17, 2019. I will give the patient a refill for warfarin as well as acyclovir. She will come back for follow-up visit in 4 weeks for evaluation. The patient was advised to call immediately if she has any concerning symptoms in the interval.  The patient was advised to call immediately if she has any concerning symptoms in the interval.  All questions were answered. The patient knows to call the clinic with any problems, questions or concerns. We can certainly see the patient much sooner if necessary.  Disclaimer: This note was dictated with voice recognition software. Similar sounding words can inadvertently be transcribed and may not be corrected upon review.

## 2019-03-03 ENCOUNTER — Telehealth: Payer: Self-pay | Admitting: Internal Medicine

## 2019-03-03 NOTE — Telephone Encounter (Signed)
Scheduled per los. Called and spoke with patient. Confirmed appt 

## 2019-03-06 ENCOUNTER — Encounter: Payer: Self-pay | Admitting: Internal Medicine

## 2019-03-07 ENCOUNTER — Other Ambulatory Visit: Payer: Self-pay | Admitting: Medical Oncology

## 2019-03-07 DIAGNOSIS — C9000 Multiple myeloma not having achieved remission: Secondary | ICD-10-CM

## 2019-03-07 MED ORDER — LENALIDOMIDE 25 MG PO CAPS: ORAL_CAPSULE | ORAL | 0 refills | Status: DC

## 2019-03-09 ENCOUNTER — Telehealth: Payer: Self-pay | Admitting: Medical Oncology

## 2019-03-09 DIAGNOSIS — E1165 Type 2 diabetes mellitus with hyperglycemia: Secondary | ICD-10-CM | POA: Diagnosis not present

## 2019-03-09 DIAGNOSIS — E7800 Pure hypercholesterolemia, unspecified: Secondary | ICD-10-CM | POA: Diagnosis not present

## 2019-03-09 DIAGNOSIS — G609 Hereditary and idiopathic neuropathy, unspecified: Secondary | ICD-10-CM | POA: Diagnosis not present

## 2019-03-09 DIAGNOSIS — E039 Hypothyroidism, unspecified: Secondary | ICD-10-CM | POA: Diagnosis not present

## 2019-03-09 NOTE — Telephone Encounter (Signed)
Faxed relvimid PA to pharmacy.

## 2019-03-17 ENCOUNTER — Inpatient Hospital Stay: Payer: BC Managed Care – PPO

## 2019-03-17 ENCOUNTER — Other Ambulatory Visit: Payer: Self-pay

## 2019-03-17 ENCOUNTER — Inpatient Hospital Stay: Payer: BC Managed Care – PPO | Attending: Internal Medicine

## 2019-03-17 VITALS — BP 131/68 | HR 68 | Temp 98.3°F | Resp 18

## 2019-03-17 DIAGNOSIS — R5383 Other fatigue: Secondary | ICD-10-CM | POA: Insufficient documentation

## 2019-03-17 DIAGNOSIS — C9000 Multiple myeloma not having achieved remission: Secondary | ICD-10-CM

## 2019-03-17 DIAGNOSIS — G629 Polyneuropathy, unspecified: Secondary | ICD-10-CM | POA: Insufficient documentation

## 2019-03-17 DIAGNOSIS — R110 Nausea: Secondary | ICD-10-CM | POA: Insufficient documentation

## 2019-03-17 DIAGNOSIS — Z880 Allergy status to penicillin: Secondary | ICD-10-CM | POA: Insufficient documentation

## 2019-03-17 DIAGNOSIS — I10 Essential (primary) hypertension: Secondary | ICD-10-CM | POA: Insufficient documentation

## 2019-03-17 DIAGNOSIS — R197 Diarrhea, unspecified: Secondary | ICD-10-CM | POA: Diagnosis not present

## 2019-03-17 DIAGNOSIS — Z5112 Encounter for antineoplastic immunotherapy: Secondary | ICD-10-CM | POA: Insufficient documentation

## 2019-03-17 DIAGNOSIS — Z7952 Long term (current) use of systemic steroids: Secondary | ICD-10-CM | POA: Insufficient documentation

## 2019-03-17 DIAGNOSIS — Z79899 Other long term (current) drug therapy: Secondary | ICD-10-CM | POA: Insufficient documentation

## 2019-03-17 DIAGNOSIS — R531 Weakness: Secondary | ICD-10-CM | POA: Insufficient documentation

## 2019-03-17 DIAGNOSIS — Z885 Allergy status to narcotic agent status: Secondary | ICD-10-CM | POA: Diagnosis not present

## 2019-03-17 DIAGNOSIS — M791 Myalgia, unspecified site: Secondary | ICD-10-CM | POA: Diagnosis not present

## 2019-03-17 DIAGNOSIS — Z7901 Long term (current) use of anticoagulants: Secondary | ICD-10-CM | POA: Diagnosis not present

## 2019-03-17 DIAGNOSIS — K5900 Constipation, unspecified: Secondary | ICD-10-CM | POA: Diagnosis not present

## 2019-03-17 LAB — CBC WITH DIFFERENTIAL (CANCER CENTER ONLY)
Abs Immature Granulocytes: 0.02 10*3/uL (ref 0.00–0.07)
Basophils Absolute: 0 10*3/uL (ref 0.0–0.1)
Basophils Relative: 1 %
Eosinophils Absolute: 0.1 10*3/uL (ref 0.0–0.5)
Eosinophils Relative: 2 %
HCT: 32 % — ABNORMAL LOW (ref 36.0–46.0)
Hemoglobin: 10 g/dL — ABNORMAL LOW (ref 12.0–15.0)
Immature Granulocytes: 1 %
Lymphocytes Relative: 22 %
Lymphs Abs: 0.7 10*3/uL (ref 0.7–4.0)
MCH: 26.8 pg (ref 26.0–34.0)
MCHC: 31.3 g/dL (ref 30.0–36.0)
MCV: 85.8 fL (ref 80.0–100.0)
Monocytes Absolute: 0.2 10*3/uL (ref 0.1–1.0)
Monocytes Relative: 7 %
Neutro Abs: 2.1 10*3/uL (ref 1.7–7.7)
Neutrophils Relative %: 67 %
Platelet Count: 133 10*3/uL — ABNORMAL LOW (ref 150–400)
RBC: 3.73 MIL/uL — ABNORMAL LOW (ref 3.87–5.11)
RDW: 14.8 % (ref 11.5–15.5)
WBC Count: 3.1 10*3/uL — ABNORMAL LOW (ref 4.0–10.5)
nRBC: 0 % (ref 0.0–0.2)

## 2019-03-17 LAB — CMP (CANCER CENTER ONLY)
ALT: 39 U/L (ref 0–44)
AST: 54 U/L — ABNORMAL HIGH (ref 15–41)
Albumin: 3.9 g/dL (ref 3.5–5.0)
Alkaline Phosphatase: 132 U/L — ABNORMAL HIGH (ref 38–126)
Anion gap: 9 (ref 5–15)
BUN: 8 mg/dL (ref 6–20)
CO2: 23 mmol/L (ref 22–32)
Calcium: 9.3 mg/dL (ref 8.9–10.3)
Chloride: 105 mmol/L (ref 98–111)
Creatinine: 0.81 mg/dL (ref 0.44–1.00)
GFR, Est AFR Am: >60 mL/min (ref >60)
GFR, Estimated: >60 mL/min (ref >60)
Glucose, Bld: 152 mg/dL — ABNORMAL HIGH (ref 70–99)
Potassium: 4.6 mmol/L (ref 3.5–5.1)
Sodium: 137 mmol/L (ref 135–145)
Total Bilirubin: 0.3 mg/dL (ref 0.3–1.2)
Total Protein: 7 g/dL (ref 6.5–8.1)

## 2019-03-17 MED ORDER — BORTEZOMIB CHEMO SQ INJECTION 3.5 MG (2.5MG/ML)
1.3000 mg/m2 | Freq: Once | INTRAMUSCULAR | Status: AC
  Administered 2019-03-17: 3 mg via SUBCUTANEOUS
  Filled 2019-03-17: qty 1.2

## 2019-03-17 MED ORDER — ONDANSETRON HCL 8 MG PO TABS
ORAL_TABLET | ORAL | Status: AC
  Filled 2019-03-17: qty 1

## 2019-03-17 MED ORDER — ONDANSETRON HCL 8 MG PO TABS
8.0000 mg | ORAL_TABLET | Freq: Once | ORAL | Status: AC
  Administered 2019-03-17: 8 mg via ORAL

## 2019-03-24 ENCOUNTER — Inpatient Hospital Stay: Payer: BC Managed Care – PPO

## 2019-03-24 ENCOUNTER — Encounter: Payer: Self-pay | Admitting: Internal Medicine

## 2019-03-24 ENCOUNTER — Other Ambulatory Visit: Payer: Self-pay

## 2019-03-24 VITALS — BP 149/68 | HR 71 | Temp 98.6°F | Resp 16 | Ht 67.0 in | Wt 257.2 lb

## 2019-03-24 DIAGNOSIS — Z7952 Long term (current) use of systemic steroids: Secondary | ICD-10-CM | POA: Diagnosis not present

## 2019-03-24 DIAGNOSIS — I10 Essential (primary) hypertension: Secondary | ICD-10-CM | POA: Diagnosis not present

## 2019-03-24 DIAGNOSIS — Z5112 Encounter for antineoplastic immunotherapy: Secondary | ICD-10-CM | POA: Diagnosis not present

## 2019-03-24 DIAGNOSIS — C9000 Multiple myeloma not having achieved remission: Secondary | ICD-10-CM | POA: Diagnosis not present

## 2019-03-24 DIAGNOSIS — R531 Weakness: Secondary | ICD-10-CM | POA: Diagnosis not present

## 2019-03-24 DIAGNOSIS — R110 Nausea: Secondary | ICD-10-CM | POA: Diagnosis not present

## 2019-03-24 DIAGNOSIS — K5900 Constipation, unspecified: Secondary | ICD-10-CM | POA: Diagnosis not present

## 2019-03-24 DIAGNOSIS — R5383 Other fatigue: Secondary | ICD-10-CM | POA: Diagnosis not present

## 2019-03-24 DIAGNOSIS — Z79899 Other long term (current) drug therapy: Secondary | ICD-10-CM | POA: Diagnosis not present

## 2019-03-24 DIAGNOSIS — R197 Diarrhea, unspecified: Secondary | ICD-10-CM | POA: Diagnosis not present

## 2019-03-24 DIAGNOSIS — Z880 Allergy status to penicillin: Secondary | ICD-10-CM | POA: Diagnosis not present

## 2019-03-24 DIAGNOSIS — Z7901 Long term (current) use of anticoagulants: Secondary | ICD-10-CM | POA: Diagnosis not present

## 2019-03-24 DIAGNOSIS — M791 Myalgia, unspecified site: Secondary | ICD-10-CM | POA: Diagnosis not present

## 2019-03-24 DIAGNOSIS — Z885 Allergy status to narcotic agent status: Secondary | ICD-10-CM | POA: Diagnosis not present

## 2019-03-24 DIAGNOSIS — G629 Polyneuropathy, unspecified: Secondary | ICD-10-CM | POA: Diagnosis not present

## 2019-03-24 LAB — CMP (CANCER CENTER ONLY)
ALT: 45 U/L — ABNORMAL HIGH (ref 0–44)
AST: 43 U/L — ABNORMAL HIGH (ref 15–41)
Albumin: 4 g/dL (ref 3.5–5.0)
Alkaline Phosphatase: 122 U/L (ref 38–126)
Anion gap: 15 (ref 5–15)
BUN: 8 mg/dL (ref 6–20)
CO2: 21 mmol/L — ABNORMAL LOW (ref 22–32)
Calcium: 9.2 mg/dL (ref 8.9–10.3)
Chloride: 102 mmol/L (ref 98–111)
Creatinine: 0.79 mg/dL (ref 0.44–1.00)
GFR, Est AFR Am: >60 mL/min (ref >60)
GFR, Estimated: >60 mL/min (ref >60)
Glucose, Bld: 139 mg/dL — ABNORMAL HIGH (ref 70–99)
Potassium: 4.2 mmol/L (ref 3.5–5.1)
Sodium: 138 mmol/L (ref 135–145)
Total Bilirubin: 0.5 mg/dL (ref 0.3–1.2)
Total Protein: 7.1 g/dL (ref 6.5–8.1)

## 2019-03-24 LAB — CBC WITH DIFFERENTIAL (CANCER CENTER ONLY)
Abs Immature Granulocytes: 0.03 10*3/uL (ref 0.00–0.07)
Basophils Absolute: 0 10*3/uL (ref 0.0–0.1)
Basophils Relative: 1 %
Eosinophils Absolute: 0.1 10*3/uL (ref 0.0–0.5)
Eosinophils Relative: 2 %
HCT: 32.4 % — ABNORMAL LOW (ref 36.0–46.0)
Hemoglobin: 10 g/dL — ABNORMAL LOW (ref 12.0–15.0)
Immature Granulocytes: 1 %
Lymphocytes Relative: 17 %
Lymphs Abs: 0.7 10*3/uL (ref 0.7–4.0)
MCH: 26.3 pg (ref 26.0–34.0)
MCHC: 30.9 g/dL (ref 30.0–36.0)
MCV: 85.3 fL (ref 80.0–100.0)
Monocytes Absolute: 0.3 10*3/uL (ref 0.1–1.0)
Monocytes Relative: 6 %
Neutro Abs: 3 10*3/uL (ref 1.7–7.7)
Neutrophils Relative %: 73 %
Platelet Count: 143 10*3/uL — ABNORMAL LOW (ref 150–400)
RBC: 3.8 MIL/uL — ABNORMAL LOW (ref 3.87–5.11)
RDW: 14.3 % (ref 11.5–15.5)
WBC Count: 4.1 10*3/uL (ref 4.0–10.5)
nRBC: 0 % (ref 0.0–0.2)

## 2019-03-24 MED ORDER — BORTEZOMIB CHEMO SQ INJECTION 3.5 MG (2.5MG/ML)
1.3000 mg/m2 | Freq: Once | INTRAMUSCULAR | Status: AC
  Administered 2019-03-24: 10:00:00 3 mg via SUBCUTANEOUS
  Filled 2019-03-24: qty 1.2

## 2019-03-24 MED ORDER — ONDANSETRON HCL 8 MG PO TABS
8.0000 mg | ORAL_TABLET | Freq: Once | ORAL | Status: AC
  Administered 2019-03-24: 8 mg via ORAL

## 2019-03-24 MED ORDER — ONDANSETRON HCL 8 MG PO TABS
ORAL_TABLET | ORAL | Status: AC
  Filled 2019-03-24: qty 1

## 2019-03-24 NOTE — Patient Instructions (Signed)
[facility] Discharge Instructions for Patients Receiving Chemotherapy  Today you received the following chemotherapy agents:  bortezomib (Velcade)  To help prevent nausea and vomiting after your treatment, we encourage you to take your nausea medication as prescribed.   If you develop nausea and vomiting that is not controlled by your nausea medication, call the clinic.   BELOW ARE SYMPTOMS THAT SHOULD BE REPORTED IMMEDIATELY:  *FEVER GREATER THAN 100.5 F  *CHILLS WITH OR WITHOUT FEVER  NAUSEA AND VOMITING THAT IS NOT CONTROLLED WITH YOUR NAUSEA MEDICATION  *UNUSUAL SHORTNESS OF BREATH  *UNUSUAL BRUISING OR BLEEDING  TENDERNESS IN MOUTH AND THROAT WITH OR WITHOUT PRESENCE OF ULCERS  *URINARY PROBLEMS  *BOWEL PROBLEMS  UNUSUAL RASH Items with * indicate a potential emergency and should be followed up as soon as possible.  Feel free to call the clinic should you have any questions or concerns. The clinic phone number is [phone].  Please show the CHEMO ALERT CARD at check-in to the Emergency Department and triage nurse.   

## 2019-03-28 ENCOUNTER — Ambulatory Visit: Payer: BC Managed Care – PPO | Attending: Internal Medicine

## 2019-03-28 DIAGNOSIS — Z23 Encounter for immunization: Secondary | ICD-10-CM

## 2019-03-28 NOTE — Progress Notes (Signed)
   Covid-19 Vaccination Clinic  Name:  Brenda Conner    MRN: [id number] DOB: [birth year]  03/28/2019  [name] was observed post Covid-19 immunization for 15 minutes without incident. She was provided with Vaccine Information Sheet and instruction to access the V-Safe system.   [name] was instructed to call 911 with any severe reactions post vaccine: [name] Difficulty breathing  . Swelling of face and throat  . A fast heartbeat  . A bad rash all over body  . Dizziness and weakness   Immunizations Administered    Name Date Dose VIS Date Route   Moderna COVID-19 Vaccine 03/28/2019  9:36 AM 0.5 mL 12/09/2018 Intramuscular   Manufacturer: Moderna   Lot: [id number]   [id number]

## 2019-03-30 NOTE — Progress Notes (Signed)
[facility] OFFICE PROGRESS NOTE  Merrell, David J, MD [address] [place] [address]  DIAGNOSIS: Plasma cell dyscrasia initially diagnosed as MGUS in September 2010, with additional symptoms suggestive of POEMS syndrome.   PRIOR THERAPY: 1) Velcade 1.3 MG/M2 subcutaneously with Decadron 40 mg by mouth on a weekly basis. First cycle 11/24/2013. She status post 31 weekly doses of treatment. 2) Velcade 1.3 MG/M2 subcutaneously and weekly basis with Decadron 40 mg by mouth weekly. First dose 02/01/2015. Status post 28 cycles. 3) Revlimid 25 mg by mouth daily for 21 days every 4 weeks with weekly Decadron 20 mg. started in 11/27/2015. Status post 3 cycles discontinued secondary to lack of response.  CURRENT THERAPY: Systemic treatment with Velcade 1.3 MG/KG weekly, Revlimid 25 mg by mouth daily for 21 days every 4 weeks in addition to Decadron 20 mg by mouth weekly. First dose 03/06/2016. Status post 30 cycles.  Her treatment was on hold for the last few months.  She will resume her treatment March 17, 2019. Status post 2 cycles since restarting.   INTERVAL HISTORY: Brenda Conner 59 y.o. female returns to the clinic today for a follow-up visit.  The patient had been on observation for several months and had a repeat myeloma panel performed in February 2021 which showed evidence of disease progression.  She recently restarted her treatment with Velcade.  She is status post 2 cycles of weekly Velcade and she tolerated it fairly well without any concerning complaints except for fatigue and mild nausea. She has an old prescription for dissolvable zofran that she is requesting a refill for.  She denies any recent fever, chills, night sweats, weight loss, or lymphadenopathy. She has stable peripheral neuropathy. She denies any signs and symptoms of infection including sore throat, cough, shortness of breath, skin infections, or dysuria.  She denies any abnormal bleeding or bruising  plan.  She denies any vomiting. She reports her baseline alternating between constipation and diarrhea. She is taking her Revlimid as prescribed. Her off week of Revlimid is next week. She is here today for evaluation before starting her third cycle since restarting weekly Velcade earlier this month.  MEDICAL HISTORY: Past Medical History:  Diagnosis Date  . Acid reflux   . Anxiety   . Asthma   . Cancer (HCC)    waldenstroms/ macroglobinulemia  . Depression   . Depression   . Diabetes mellitus without complication (HCC)   . Dysuria 02/28/2016  . Hypercholesteremia   . Hypertension   . Hypothyroidism   . Macroglobulinemia (HCC)    ? POEMS syndrome  . Multiple myeloma (HCC)   . Obesity   . PONV (postoperative nausea and vomiting)   . Sleep apnea    CPAP at bedtime  . Sleep apnea     ALLERGIES:  is allergic to codeine; hydrocodone; lortab [hydrocodone-acetaminophen]; onion; shellfish allergy; and amoxicillin.  MEDICATIONS:  Current Outpatient Medications  Medication Sig Dispense Refill  . sertraline (ZOLOFT) 50 MG tablet TAKE 3 TABLETS BY MOUTH EVERY DAY 270 tablet 3  . ACCU-CHEK AVIVA PLUS test strip USE TO CHECK SUGAR TWICE A DAY 90    . acyclovir (ZOVIRAX) 400 MG tablet Take 1 tablet (400 mg total) by mouth 2 (two) times daily. 180 tablet 1  . ALPRAZolam (XANAX) 0.5 MG tablet Take 1 tablet (0.5 mg total) by mouth at bedtime as needed for anxiety. 30 tablet 1  . Azelastine HCl 0.15 % SOLN as needed.    .   B-D UF III MINI PEN NEEDLES 31G X 5 MM MISC     . Blood Glucose Monitoring Suppl (ONE TOUCH ULTRA MINI) W/DEVICE KIT See admin instructions. Reported on 01/25/2015  0  . Cetirizine HCl (ZYRTEC ALLERGY PO) Take by mouth daily.    [name] dexamethasone (DECADRON) 4 MG tablet TAKE 1 TABLET (4 MG TOTAL) BY MOUTH ONCE A WEEK. PATIENT TAKES 5 TABLETS 1 DAY WEEK PRIOR TO VELCADE APPOINTMENT. 40 tablet 1  . DiphenhydrAMINE HCl (BENADRYL ALLERGY PO) Take by mouth as needed.    [name] EPIPEN 2-PAK  0.3 MG/0.3ML SOAJ injection Reported on 06/21/2015  1  . Fexofenadine HCl (ALLEGRA ALLERGY PO) Take by mouth daily.    . Fluticasone-Salmeterol (ADVAIR) 100-50 MCG/DOSE AEPB Inhale 2 puffs into the lungs every 12 (twelve) hours.    [name] ibuprofen (ADVIL,MOTRIN) 100 MG tablet Take 100 mg by mouth every 6 (six) hours as needed. Reported on 05/31/2015    . lenalidomide (REVLIMID) 25 MG capsule TAKE 1 CAPSULE BY MOUTH ONCE DAILY 21 DAYS ON AND 7 DAYS OFF Authorization # [id number] 03/07/19 Adult female not of childbearing potential. 21 capsule 0  . levothyroxine (SYNTHROID, LEVOTHROID) 150 MCG tablet Take 150 mcg by mouth daily before breakfast.    . lisinopril (PRINIVIL,ZESTRIL) 10 MG tablet Take 10 mg by mouth daily. auth number 05/29/2018 [id number]  3  . loperamide (IMODIUM) 2 MG capsule Take 2 mg by mouth as needed for diarrhea or loose stools.    . magic mouthwash w/lidocaine SOLN Take 5 mLs by mouth 4 (four) times daily as needed for mouth pain. Swish, Gargle, and spit 240 mL 1  . metFORMIN (GLUCOPHAGE-XR) 500 MG 24 hr tablet Take 500 mg by mouth daily.    [name] omeprazole (PRILOSEC) 40 MG capsule Take 40 mg by mouth 2 (two) times daily.     . ondansetron (ZOFRAN-ODT) 8 MG disintegrating tablet Take 1 tablet (8 mg total) by mouth every 8 (eight) hours as needed. Reported on 05/31/2015 20 tablet 2  . pravastatin (PRAVACHOL) 40 MG tablet Take 40 mg by mouth every evening.     [name] PROAIR HFA 108 (90 Base) MCG/ACT inhaler Reported on 06/21/2015  1  . verapamil (CALAN-SR) 240 MG CR tablet Take 240 mg by mouth 2 (two) times daily.    [name] warfarin (COUMADIN) 2 MG tablet TAKE 1 TABLET BY MOUTH EVERY DAY 90 tablet 0   No current facility-administered medications for this visit.    SURGICAL HISTORY:  Past Surgical History:  Procedure Laterality Date  . ABLATION  09/2007   HTA and polyp resection  . BACK SURGERY  03/2004   herniation, L4-L5  . Bil Laprascopic knee surgery    . BONE MARROW BIOPSY     2011  . BONE  MARROW BIOPSY  4/14  . CHOLECYSTECTOMY    . FOOT SURGERY  1998  . KNEE ARTHROSCOPY W/ MENISCAL REPAIR  10/10 ; 3/11  . LAPAROSCOPIC CHOLECYSTECTOMY  1997  . NASAL SINUS SURGERY  2004  . UTERINE FIBROID EMBOLIZATION      REVIEW OF SYSTEMS:   Review of Systems  Constitutional: Positive for fatigue. Negative for appetite change, chills, fever and unexpected weight change.  HENT: Negative for mouth sores, nosebleeds, sore throat and trouble swallowing.   Eyes: Negative for eye problems and icterus.  Respiratory: Negative for cough, hemoptysis, shortness of breath and wheezing.  Cardiovascular: Negative for chest pain and leg swelling.  Gastrointestinal: Positive for mild nausea. Negative for abdominal pain,  constipation, diarrhea, and vomiting.  Genitourinary: Negative for bladder incontinence, difficulty urinating, dysuria, frequency and hematuria.   Musculoskeletal: Negative for back pain, gait problem, neck pain and neck stiffness.  Skin: Negative for itching and rash.  Neurological: Positive for peripheral neuropathy. Negative for dizziness, extremity weakness, gait problem, headaches, light-headedness and seizures.  Hematological: Negative for adenopathy. Does not bruise/bleed easily.  Psychiatric/Behavioral: Negative for confusion, depression and sleep disturbance. The patient is not nervous/anxious.     PHYSICAL EXAMINATION:  There were no vitals taken for this visit.  ECOG PERFORMANCE STATUS: 1 - Symptomatic but completely ambulatory  Physical Exam  Constitutional: Oriented to person, place, and time and well-developed, well-nourished, and in no distress.  HENT:  Head: Normocephalic and atraumatic.  Mouth/Throat: Oropharynx is clear and moist. No oropharyngeal exudate.  Eyes: Conjunctivae are normal. Right eye exhibits no discharge. Left eye exhibits no discharge. No scleral icterus.  Neck: Normal range of motion. Neck supple.  Cardiovascular: Normal rate, regular rhythm,  normal heart sounds and intact distal pulses.   Pulmonary/Chest: Effort normal and breath sounds normal. No respiratory distress. No wheezes. No rales.  Abdominal: Soft. Bowel sounds are normal. Exhibits no distension and no mass. There is no tenderness.  Musculoskeletal: Normal range of motion. Exhibits no edema.  Lymphadenopathy:    No cervical adenopathy.  Neurological: Alert and oriented to person, place, and time. Exhibits normal muscle tone. Gait normal. Coordination normal.  Skin: Skin is warm and dry. No rash noted. Not diaphoretic. No erythema. No pallor.  Psychiatric: Mood, memory and judgment normal.  Vitals reviewed.  LABORATORY DATA: Lab Results  Component Value Date   WBC 4.1 03/24/2019   HGB 10.0 (L) 03/24/2019   HCT 32.4 (L) 03/24/2019   MCV 85.3 03/24/2019   PLT 143 (L) 03/24/2019      Chemistry      Component Value Date/Time   NA 138 03/24/2019 0758   NA 138 11/14/2016 0824   K 4.2 03/24/2019 0758   K 4.3 11/14/2016 0824   CL 102 03/24/2019 0758   CL 104 04/07/2012 1415   CO2 21 (L) 03/24/2019 0758   CO2 23 11/14/2016 0824   BUN 8 03/24/2019 0758   BUN 9.1 11/14/2016 0824   CREATININE 0.79 03/24/2019 0758   CREATININE 0.7 11/14/2016 0824      Component Value Date/Time   CALCIUM 9.2 03/24/2019 0758   CALCIUM 9.6 11/14/2016 0824   ALKPHOS 122 03/24/2019 0758   ALKPHOS 94 11/14/2016 0824   AST 43 (H) 03/24/2019 0758   AST 28 11/14/2016 0824   ALT 45 (H) 03/24/2019 0758   ALT 24 11/14/2016 0824   BILITOT 0.5 03/24/2019 0758   BILITOT 0.41 11/14/2016 0824       RADIOGRAPHIC STUDIES:  No results found.   ASSESSMENT/PLAN:  This is a very pleasant 59-year-old Caucasian female with multiple myeloma status post several chemotherapy regimens.  She had been on treatment with subcutaneous weekly Velcade as well as Revlimid and Decadron.  She is status post 30 cycles.  She had tolerated this well except for fatigue.  She been on observation for 6  months.  She was seen in February 2021 and complained of increased fatigue, weakness, myalgias, and paresthesias.  She had a repeat myeloma panel which showed evidence of disease progression with increase in the free lambda light chain.  Dr. Mohamed recommend that she resume her treatment with weekly subcutaneous Velcade in addition to Revlimid and Decadron.  She restarted treatment on March 17, 2019.  She is status post 2 weekly treatments of Velcade which she tolerated well without any adverse side effects except for fatigue and mild nausea.   Labs were reviewed.  Recommend that she proceed with her third weekly dose of Velcade as planned.  She will continue taking her warfarin and acyclovir.  We will see her back for follow-up visit in 3 weeks for evaluation and repeat blood work.  I have sent a refill of dissolvable zofran 78m every 8 hours PRN to the patient's pharmacy per patient request.   The patient was advised to call immediately if she has any concerning symptoms in the interval. The patient voices understanding of current disease status and treatment options and is in agreement with the current care plan. All questions were answered. The patient knows to call the clinic with any problems, questions or concerns. We can certainly see the patient much sooner if necessary  No orders of the defined types were placed in this encounter.    Cassandra L Heilingoetter, PA-C 03/30/19

## 2019-03-31 ENCOUNTER — Other Ambulatory Visit: Payer: Self-pay

## 2019-03-31 ENCOUNTER — Inpatient Hospital Stay (HOSPITAL_BASED_OUTPATIENT_CLINIC_OR_DEPARTMENT_OTHER): Payer: BC Managed Care – PPO | Admitting: Physician Assistant

## 2019-03-31 ENCOUNTER — Inpatient Hospital Stay: Payer: BC Managed Care – PPO

## 2019-03-31 VITALS — BP 138/70 | HR 75 | Temp 97.8°F | Resp 18 | Ht 67.0 in | Wt 256.8 lb

## 2019-03-31 DIAGNOSIS — Z7901 Long term (current) use of anticoagulants: Secondary | ICD-10-CM | POA: Diagnosis not present

## 2019-03-31 DIAGNOSIS — Z880 Allergy status to penicillin: Secondary | ICD-10-CM | POA: Diagnosis not present

## 2019-03-31 DIAGNOSIS — Z5111 Encounter for antineoplastic chemotherapy: Secondary | ICD-10-CM | POA: Diagnosis not present

## 2019-03-31 DIAGNOSIS — Z79899 Other long term (current) drug therapy: Secondary | ICD-10-CM | POA: Diagnosis not present

## 2019-03-31 DIAGNOSIS — R197 Diarrhea, unspecified: Secondary | ICD-10-CM | POA: Diagnosis not present

## 2019-03-31 DIAGNOSIS — Z885 Allergy status to narcotic agent status: Secondary | ICD-10-CM | POA: Diagnosis not present

## 2019-03-31 DIAGNOSIS — C9000 Multiple myeloma not having achieved remission: Secondary | ICD-10-CM

## 2019-03-31 DIAGNOSIS — R531 Weakness: Secondary | ICD-10-CM | POA: Diagnosis not present

## 2019-03-31 DIAGNOSIS — G629 Polyneuropathy, unspecified: Secondary | ICD-10-CM | POA: Diagnosis not present

## 2019-03-31 DIAGNOSIS — K5900 Constipation, unspecified: Secondary | ICD-10-CM | POA: Diagnosis not present

## 2019-03-31 DIAGNOSIS — R110 Nausea: Secondary | ICD-10-CM

## 2019-03-31 DIAGNOSIS — Z5112 Encounter for antineoplastic immunotherapy: Secondary | ICD-10-CM | POA: Diagnosis not present

## 2019-03-31 DIAGNOSIS — I10 Essential (primary) hypertension: Secondary | ICD-10-CM | POA: Diagnosis not present

## 2019-03-31 DIAGNOSIS — M791 Myalgia, unspecified site: Secondary | ICD-10-CM | POA: Diagnosis not present

## 2019-03-31 DIAGNOSIS — R5383 Other fatigue: Secondary | ICD-10-CM | POA: Diagnosis not present

## 2019-03-31 DIAGNOSIS — Z7952 Long term (current) use of systemic steroids: Secondary | ICD-10-CM | POA: Diagnosis not present

## 2019-03-31 LAB — CBC WITH DIFFERENTIAL (CANCER CENTER ONLY)
Abs Immature Granulocytes: 0.03 10*3/uL (ref 0.00–0.07)
Basophils Absolute: 0 10*3/uL (ref 0.0–0.1)
Basophils Relative: 1 %
Eosinophils Absolute: 0.1 10*3/uL (ref 0.0–0.5)
Eosinophils Relative: 3 %
HCT: 30.3 % — ABNORMAL LOW (ref 36.0–46.0)
Hemoglobin: 9.5 g/dL — ABNORMAL LOW (ref 12.0–15.0)
Immature Granulocytes: 1 %
Lymphocytes Relative: 14 %
Lymphs Abs: 0.6 10*3/uL — ABNORMAL LOW (ref 0.7–4.0)
MCH: 27 pg (ref 26.0–34.0)
MCHC: 31.4 g/dL (ref 30.0–36.0)
MCV: 86.1 fL (ref 80.0–100.0)
Monocytes Absolute: 0.4 10*3/uL (ref 0.1–1.0)
Monocytes Relative: 10 %
Neutro Abs: 3.1 10*3/uL (ref 1.7–7.7)
Neutrophils Relative %: 71 %
Platelet Count: 119 10*3/uL — ABNORMAL LOW (ref 150–400)
RBC: 3.52 MIL/uL — ABNORMAL LOW (ref 3.87–5.11)
RDW: 14.9 % (ref 11.5–15.5)
WBC Count: 4.3 10*3/uL (ref 4.0–10.5)
nRBC: 0 % (ref 0.0–0.2)

## 2019-03-31 LAB — CMP (CANCER CENTER ONLY)
ALT: 49 U/L — ABNORMAL HIGH (ref 0–44)
AST: 43 U/L — ABNORMAL HIGH (ref 15–41)
Albumin: 3.6 g/dL (ref 3.5–5.0)
Alkaline Phosphatase: 120 U/L (ref 38–126)
Anion gap: 9 (ref 5–15)
BUN: 7 mg/dL (ref 6–20)
CO2: 21 mmol/L — ABNORMAL LOW (ref 22–32)
Calcium: 8.6 mg/dL — ABNORMAL LOW (ref 8.9–10.3)
Chloride: 108 mmol/L (ref 98–111)
Creatinine: 0.77 mg/dL (ref 0.44–1.00)
GFR, Est AFR Am: >60 mL/min (ref >60)
GFR, Estimated: >60 mL/min (ref >60)
Glucose, Bld: 143 mg/dL — ABNORMAL HIGH (ref 70–99)
Potassium: 3.3 mmol/L — ABNORMAL LOW (ref 3.5–5.1)
Sodium: 138 mmol/L (ref 135–145)
Total Bilirubin: 0.5 mg/dL (ref 0.3–1.2)
Total Protein: 6.5 g/dL (ref 6.5–8.1)

## 2019-03-31 MED ORDER — ONDANSETRON HCL 8 MG PO TABS
8.0000 mg | ORAL_TABLET | Freq: Once | ORAL | Status: AC
  Administered 2019-03-31: 8 mg via ORAL

## 2019-03-31 MED ORDER — BORTEZOMIB CHEMO SQ INJECTION 3.5 MG (2.5MG/ML)
1.3000 mg/m2 | Freq: Once | INTRAMUSCULAR | Status: AC
  Administered 2019-03-31: 3 mg via SUBCUTANEOUS
  Filled 2019-03-31: qty 1.2

## 2019-03-31 MED ORDER — ONDANSETRON 8 MG PO TBDP: 8.0000 mg | ORAL_TABLET | Freq: Three times a day (TID) | ORAL | 2 refills | Status: DC | PRN

## 2019-03-31 MED ORDER — ONDANSETRON HCL 8 MG PO TABS
ORAL_TABLET | ORAL | Status: AC
  Filled 2019-03-31: qty 1

## 2019-03-31 NOTE — Patient Instructions (Signed)
[facility] Discharge Instructions for Patients Receiving Chemotherapy  Today you received the following chemotherapy agents: bortezomib.  To help prevent nausea and vomiting after your treatment, we encourage you to take your nausea medication as directed.   If you develop nausea and vomiting that is not controlled by your nausea medication, call the clinic.   BELOW ARE SYMPTOMS THAT SHOULD BE REPORTED IMMEDIATELY:  *FEVER GREATER THAN 100.5 F  *CHILLS WITH OR WITHOUT FEVER  NAUSEA AND VOMITING THAT IS NOT CONTROLLED WITH YOUR NAUSEA MEDICATION  *UNUSUAL SHORTNESS OF BREATH  *UNUSUAL BRUISING OR BLEEDING  TENDERNESS IN MOUTH AND THROAT WITH OR WITHOUT PRESENCE OF ULCERS  *URINARY PROBLEMS  *BOWEL PROBLEMS  UNUSUAL RASH Items with * indicate a potential emergency and should be followed up as soon as possible.  Feel free to call the clinic should you have any questions or concerns. The clinic phone number is [phone].  Please show the CHEMO ALERT CARD at check-in to the Emergency Department and triage nurse.   

## 2019-04-01 ENCOUNTER — Telehealth: Payer: Self-pay | Admitting: Physician Assistant

## 2019-04-01 NOTE — Telephone Encounter (Signed)
Scheduled per los. Called and left msg. Mailed printout  °

## 2019-04-01 NOTE — Progress Notes (Signed)
Pharmacist Chemotherapy Monitoring - Follow Up Assessment    I verify that I have reviewed each item in the below checklist:  . Regimen for the patient is scheduled for the appropriate day and plan matches scheduled date. [name] Appropriate non-routine labs are ordered dependent on drug ordered. . If applicable, additional medications reviewed and ordered per protocol based on lifetime cumulative doses and/or treatment regimen.   Plan for follow-up and/or issues identified: No . I-vent associated with next due treatment: No . MD and/or nursing notified: No  KATLEEN NESHEIM 04/01/2019 11:53 AM

## 2019-04-06 ENCOUNTER — Other Ambulatory Visit: Payer: Self-pay | Admitting: Internal Medicine

## 2019-04-06 DIAGNOSIS — C9000 Multiple myeloma not having achieved remission: Secondary | ICD-10-CM

## 2019-04-07 ENCOUNTER — Inpatient Hospital Stay: Payer: BC Managed Care – PPO

## 2019-04-07 ENCOUNTER — Other Ambulatory Visit: Payer: Self-pay | Admitting: Medical Oncology

## 2019-04-07 ENCOUNTER — Other Ambulatory Visit: Payer: Self-pay

## 2019-04-07 VITALS — BP 115/99 | HR 59 | Temp 98.2°F | Resp 18

## 2019-04-07 DIAGNOSIS — I10 Essential (primary) hypertension: Secondary | ICD-10-CM | POA: Diagnosis not present

## 2019-04-07 DIAGNOSIS — G629 Polyneuropathy, unspecified: Secondary | ICD-10-CM | POA: Diagnosis not present

## 2019-04-07 DIAGNOSIS — R531 Weakness: Secondary | ICD-10-CM | POA: Diagnosis not present

## 2019-04-07 DIAGNOSIS — Z880 Allergy status to penicillin: Secondary | ICD-10-CM | POA: Diagnosis not present

## 2019-04-07 DIAGNOSIS — R5383 Other fatigue: Secondary | ICD-10-CM | POA: Diagnosis not present

## 2019-04-07 DIAGNOSIS — R197 Diarrhea, unspecified: Secondary | ICD-10-CM | POA: Diagnosis not present

## 2019-04-07 DIAGNOSIS — R110 Nausea: Secondary | ICD-10-CM | POA: Diagnosis not present

## 2019-04-07 DIAGNOSIS — Z885 Allergy status to narcotic agent status: Secondary | ICD-10-CM | POA: Diagnosis not present

## 2019-04-07 DIAGNOSIS — K5900 Constipation, unspecified: Secondary | ICD-10-CM | POA: Diagnosis not present

## 2019-04-07 DIAGNOSIS — C9000 Multiple myeloma not having achieved remission: Secondary | ICD-10-CM | POA: Diagnosis not present

## 2019-04-07 DIAGNOSIS — M791 Myalgia, unspecified site: Secondary | ICD-10-CM | POA: Diagnosis not present

## 2019-04-07 DIAGNOSIS — Z7901 Long term (current) use of anticoagulants: Secondary | ICD-10-CM | POA: Diagnosis not present

## 2019-04-07 DIAGNOSIS — Z5112 Encounter for antineoplastic immunotherapy: Secondary | ICD-10-CM | POA: Diagnosis not present

## 2019-04-07 DIAGNOSIS — Z79899 Other long term (current) drug therapy: Secondary | ICD-10-CM | POA: Diagnosis not present

## 2019-04-07 DIAGNOSIS — Z7952 Long term (current) use of systemic steroids: Secondary | ICD-10-CM | POA: Diagnosis not present

## 2019-04-07 LAB — CBC WITH DIFFERENTIAL (CANCER CENTER ONLY)
Abs Immature Granulocytes: 0.01 10*3/uL (ref 0.00–0.07)
Basophils Absolute: 0 10*3/uL (ref 0.0–0.1)
Basophils Relative: 1 %
Eosinophils Absolute: 0.1 10*3/uL (ref 0.0–0.5)
Eosinophils Relative: 4 %
HCT: 30.1 % — ABNORMAL LOW (ref 36.0–46.0)
Hemoglobin: 9.2 g/dL — ABNORMAL LOW (ref 12.0–15.0)
Immature Granulocytes: 0 %
Lymphocytes Relative: 16 %
Lymphs Abs: 0.5 10*3/uL — ABNORMAL LOW (ref 0.7–4.0)
MCH: 26.9 pg (ref 26.0–34.0)
MCHC: 30.6 g/dL (ref 30.0–36.0)
MCV: 88 fL (ref 80.0–100.0)
Monocytes Absolute: 0.4 10*3/uL (ref 0.1–1.0)
Monocytes Relative: 13 %
Neutro Abs: 2.1 10*3/uL (ref 1.7–7.7)
Neutrophils Relative %: 66 %
Platelet Count: 117 10*3/uL — ABNORMAL LOW (ref 150–400)
RBC: 3.42 MIL/uL — ABNORMAL LOW (ref 3.87–5.11)
RDW: 15.4 % (ref 11.5–15.5)
WBC Count: 3.2 10*3/uL — ABNORMAL LOW (ref 4.0–10.5)
nRBC: 0 % (ref 0.0–0.2)

## 2019-04-07 LAB — CMP (CANCER CENTER ONLY)
ALT: 51 U/L — ABNORMAL HIGH (ref 0–44)
AST: 44 U/L — ABNORMAL HIGH (ref 15–41)
Albumin: 3.5 g/dL (ref 3.5–5.0)
Alkaline Phosphatase: 125 U/L (ref 38–126)
Anion gap: 10 (ref 5–15)
BUN: 9 mg/dL (ref 6–20)
CO2: 24 mmol/L (ref 22–32)
Calcium: 8.8 mg/dL — ABNORMAL LOW (ref 8.9–10.3)
Chloride: 105 mmol/L (ref 98–111)
Creatinine: 0.8 mg/dL (ref 0.44–1.00)
GFR, Est AFR Am: >60 mL/min (ref >60)
GFR, Estimated: >60 mL/min (ref >60)
Glucose, Bld: 143 mg/dL — ABNORMAL HIGH (ref 70–99)
Potassium: 3.9 mmol/L (ref 3.5–5.1)
Sodium: 139 mmol/L (ref 135–145)
Total Bilirubin: 0.6 mg/dL (ref 0.3–1.2)
Total Protein: 6.5 g/dL (ref 6.5–8.1)

## 2019-04-07 MED ORDER — ONDANSETRON HCL 8 MG PO TABS
8.0000 mg | ORAL_TABLET | Freq: Once | ORAL | Status: AC
  Administered 2019-04-07: 09:00:00 8 mg via ORAL

## 2019-04-07 MED ORDER — ONDANSETRON HCL 8 MG PO TABS
ORAL_TABLET | ORAL | Status: AC
  Filled 2019-04-07: qty 1

## 2019-04-07 MED ORDER — LENALIDOMIDE 25 MG PO CAPS: ORAL_CAPSULE | ORAL | 0 refills | Status: DC

## 2019-04-07 MED ORDER — BORTEZOMIB CHEMO SQ INJECTION 3.5 MG (2.5MG/ML)
1.3000 mg/m2 | Freq: Once | INTRAMUSCULAR | Status: AC
  Administered 2019-04-07: 3 mg via SUBCUTANEOUS
  Filled 2019-04-07: qty 1.2

## 2019-04-07 NOTE — Telephone Encounter (Signed)
Revlimid refill

## 2019-04-07 NOTE — Patient Instructions (Signed)
[facility] Discharge Instructions for Patients Receiving Chemotherapy  Today you received the following chemotherapy agents: bortezomib.  To help prevent nausea and vomiting after your treatment, we encourage you to take your nausea medication as directed.   If you develop nausea and vomiting that is not controlled by your nausea medication, call the clinic.   BELOW ARE SYMPTOMS THAT SHOULD BE REPORTED IMMEDIATELY:  *FEVER GREATER THAN 100.5 F  *CHILLS WITH OR WITHOUT FEVER  NAUSEA AND VOMITING THAT IS NOT CONTROLLED WITH YOUR NAUSEA MEDICATION  *UNUSUAL SHORTNESS OF BREATH  *UNUSUAL BRUISING OR BLEEDING  TENDERNESS IN MOUTH AND THROAT WITH OR WITHOUT PRESENCE OF ULCERS  *URINARY PROBLEMS  *BOWEL PROBLEMS  UNUSUAL RASH Items with * indicate a potential emergency and should be followed up as soon as possible.  Feel free to call the clinic should you have any questions or concerns. The clinic phone number is [phone].  Please show the CHEMO ALERT CARD at check-in to the Emergency Department and triage nurse.   

## 2019-04-08 NOTE — Progress Notes (Signed)
Pharmacist Chemotherapy Monitoring - Follow Up Assessment    I verify that I have reviewed each item in the below checklist:  . Regimen for the patient is scheduled for the appropriate day and plan matches scheduled date. [name] Appropriate non-routine labs are ordered dependent on drug ordered. . If applicable, additional medications reviewed and ordered per protocol based on lifetime cumulative doses and/or treatment regimen.   Plan for follow-up and/or issues identified: No . I-vent associated with next due treatment: No . MD and/or nursing notified: No  Cortlan Dolin K 04/08/2019 8:40 AM

## 2019-04-14 ENCOUNTER — Other Ambulatory Visit: Payer: Self-pay

## 2019-04-14 ENCOUNTER — Inpatient Hospital Stay: Payer: BC Managed Care – PPO

## 2019-04-14 ENCOUNTER — Inpatient Hospital Stay: Payer: BC Managed Care – PPO | Attending: Internal Medicine

## 2019-04-14 VITALS — BP 128/53 | HR 55 | Temp 98.0°F | Resp 18

## 2019-04-14 DIAGNOSIS — C9000 Multiple myeloma not having achieved remission: Secondary | ICD-10-CM

## 2019-04-14 DIAGNOSIS — Z880 Allergy status to penicillin: Secondary | ICD-10-CM | POA: Insufficient documentation

## 2019-04-14 DIAGNOSIS — Z7901 Long term (current) use of anticoagulants: Secondary | ICD-10-CM | POA: Insufficient documentation

## 2019-04-14 DIAGNOSIS — Z7952 Long term (current) use of systemic steroids: Secondary | ICD-10-CM | POA: Diagnosis not present

## 2019-04-14 DIAGNOSIS — Z5112 Encounter for antineoplastic immunotherapy: Secondary | ICD-10-CM | POA: Diagnosis not present

## 2019-04-14 DIAGNOSIS — I10 Essential (primary) hypertension: Secondary | ICD-10-CM | POA: Insufficient documentation

## 2019-04-14 DIAGNOSIS — Z79899 Other long term (current) drug therapy: Secondary | ICD-10-CM | POA: Diagnosis not present

## 2019-04-14 DIAGNOSIS — R5383 Other fatigue: Secondary | ICD-10-CM | POA: Insufficient documentation

## 2019-04-14 DIAGNOSIS — Z885 Allergy status to narcotic agent status: Secondary | ICD-10-CM | POA: Diagnosis not present

## 2019-04-14 LAB — CBC WITH DIFFERENTIAL (CANCER CENTER ONLY)
Abs Immature Granulocytes: 0.01 10*3/uL (ref 0.00–0.07)
Basophils Absolute: 0.1 10*3/uL (ref 0.0–0.1)
Basophils Relative: 2 %
Eosinophils Absolute: 0.1 10*3/uL (ref 0.0–0.5)
Eosinophils Relative: 1 %
HCT: 31.7 % — ABNORMAL LOW (ref 36.0–46.0)
Hemoglobin: 9.7 g/dL — ABNORMAL LOW (ref 12.0–15.0)
Immature Granulocytes: 0 %
Lymphocytes Relative: 19 %
Lymphs Abs: 0.7 10*3/uL (ref 0.7–4.0)
MCH: 26.8 pg (ref 26.0–34.0)
MCHC: 30.6 g/dL (ref 30.0–36.0)
MCV: 87.6 fL (ref 80.0–100.0)
Monocytes Absolute: 0.4 10*3/uL (ref 0.1–1.0)
Monocytes Relative: 11 %
Neutro Abs: 2.4 10*3/uL (ref 1.7–7.7)
Neutrophils Relative %: 67 %
Platelet Count: 140 10*3/uL — ABNORMAL LOW (ref 150–400)
RBC: 3.62 MIL/uL — ABNORMAL LOW (ref 3.87–5.11)
RDW: 15.3 % (ref 11.5–15.5)
WBC Count: 3.5 10*3/uL — ABNORMAL LOW (ref 4.0–10.5)
nRBC: 0 % (ref 0.0–0.2)

## 2019-04-14 LAB — CMP (CANCER CENTER ONLY)
ALT: 39 U/L (ref 0–44)
AST: 39 U/L (ref 15–41)
Albumin: 3.6 g/dL (ref 3.5–5.0)
Alkaline Phosphatase: 139 U/L — ABNORMAL HIGH (ref 38–126)
Anion gap: 10 (ref 5–15)
BUN: 13 mg/dL (ref 6–20)
CO2: 23 mmol/L (ref 22–32)
Calcium: 9.2 mg/dL (ref 8.9–10.3)
Chloride: 105 mmol/L (ref 98–111)
Creatinine: 0.85 mg/dL (ref 0.44–1.00)
GFR, Est AFR Am: >60 mL/min (ref >60)
GFR, Estimated: >60 mL/min (ref >60)
Glucose, Bld: 151 mg/dL — ABNORMAL HIGH (ref 70–99)
Potassium: 4.4 mmol/L (ref 3.5–5.1)
Sodium: 138 mmol/L (ref 135–145)
Total Bilirubin: 0.5 mg/dL (ref 0.3–1.2)
Total Protein: 6.5 g/dL (ref 6.5–8.1)

## 2019-04-14 MED ORDER — ONDANSETRON HCL 8 MG PO TABS
8.0000 mg | ORAL_TABLET | Freq: Once | ORAL | Status: AC
  Administered 2019-04-14: 8 mg via ORAL

## 2019-04-14 MED ORDER — ONDANSETRON HCL 8 MG PO TABS
ORAL_TABLET | ORAL | Status: AC
  Filled 2019-04-14: qty 1

## 2019-04-14 MED ORDER — BORTEZOMIB CHEMO SQ INJECTION 3.5 MG (2.5MG/ML)
1.3000 mg/m2 | Freq: Once | INTRAMUSCULAR | Status: AC
  Administered 2019-04-14: 3 mg via SUBCUTANEOUS
  Filled 2019-04-14: qty 1.2

## 2019-04-14 NOTE — Patient Instructions (Signed)
[facility] Discharge Instructions for Patients Receiving Chemotherapy  Today you received the following chemotherapy agents: bortezomib.  To help prevent nausea and vomiting after your treatment, we encourage you to take your nausea medication as directed.   If you develop nausea and vomiting that is not controlled by your nausea medication, call the clinic.   BELOW ARE SYMPTOMS THAT SHOULD BE REPORTED IMMEDIATELY:  *FEVER GREATER THAN 100.5 F  *CHILLS WITH OR WITHOUT FEVER  NAUSEA AND VOMITING THAT IS NOT CONTROLLED WITH YOUR NAUSEA MEDICATION  *UNUSUAL SHORTNESS OF BREATH  *UNUSUAL BRUISING OR BLEEDING  TENDERNESS IN MOUTH AND THROAT WITH OR WITHOUT PRESENCE OF ULCERS  *URINARY PROBLEMS  *BOWEL PROBLEMS  UNUSUAL RASH Items with * indicate a potential emergency and should be followed up as soon as possible.  Feel free to call the clinic should you have any questions or concerns. The clinic phone number is [phone].  Please show the CHEMO ALERT CARD at check-in to the Emergency Department and triage nurse.   

## 2019-04-15 NOTE — Progress Notes (Signed)
Pharmacist Chemotherapy Monitoring - Follow Up Assessment    I verify that I have reviewed each item in the below checklist:  . Regimen for the patient is scheduled for the appropriate day and plan matches scheduled date. [name] Appropriate non-routine labs are ordered dependent on drug ordered. . If applicable, additional medications reviewed and ordered per protocol based on lifetime cumulative doses and/or treatment regimen.   Plan for follow-up and/or issues identified: No . I-vent associated with next due treatment: No . MD and/or nursing notified: No  Romualdo Bolk [facility] 04/15/2019 3:14 PM

## 2019-04-21 ENCOUNTER — Inpatient Hospital Stay: Payer: BC Managed Care – PPO

## 2019-04-21 ENCOUNTER — Other Ambulatory Visit: Payer: Self-pay

## 2019-04-21 VITALS — BP 126/53 | HR 63 | Temp 98.2°F | Resp 17 | Wt 257.0 lb

## 2019-04-21 DIAGNOSIS — Z79899 Other long term (current) drug therapy: Secondary | ICD-10-CM | POA: Diagnosis not present

## 2019-04-21 DIAGNOSIS — I10 Essential (primary) hypertension: Secondary | ICD-10-CM | POA: Diagnosis not present

## 2019-04-21 DIAGNOSIS — C9000 Multiple myeloma not having achieved remission: Secondary | ICD-10-CM | POA: Diagnosis not present

## 2019-04-21 DIAGNOSIS — Z885 Allergy status to narcotic agent status: Secondary | ICD-10-CM | POA: Diagnosis not present

## 2019-04-21 DIAGNOSIS — Z7952 Long term (current) use of systemic steroids: Secondary | ICD-10-CM | POA: Diagnosis not present

## 2019-04-21 DIAGNOSIS — Z7901 Long term (current) use of anticoagulants: Secondary | ICD-10-CM | POA: Diagnosis not present

## 2019-04-21 DIAGNOSIS — Z5112 Encounter for antineoplastic immunotherapy: Secondary | ICD-10-CM | POA: Diagnosis not present

## 2019-04-21 DIAGNOSIS — Z880 Allergy status to penicillin: Secondary | ICD-10-CM | POA: Diagnosis not present

## 2019-04-21 DIAGNOSIS — R5383 Other fatigue: Secondary | ICD-10-CM | POA: Diagnosis not present

## 2019-04-21 LAB — CMP (CANCER CENTER ONLY)
ALT: 43 U/L (ref 0–44)
AST: 42 U/L — ABNORMAL HIGH (ref 15–41)
Albumin: 3.7 g/dL (ref 3.5–5.0)
Alkaline Phosphatase: 123 U/L (ref 38–126)
Anion gap: 12 (ref 5–15)
BUN: 7 mg/dL (ref 6–20)
CO2: 19 mmol/L — ABNORMAL LOW (ref 22–32)
Calcium: 9 mg/dL (ref 8.9–10.3)
Chloride: 107 mmol/L (ref 98–111)
Creatinine: 0.81 mg/dL (ref 0.44–1.00)
GFR, Est AFR Am: >60 mL/min (ref >60)
GFR, Estimated: >60 mL/min (ref >60)
Glucose, Bld: 143 mg/dL — ABNORMAL HIGH (ref 70–99)
Potassium: 3.7 mmol/L (ref 3.5–5.1)
Sodium: 138 mmol/L (ref 135–145)
Total Bilirubin: 0.6 mg/dL (ref 0.3–1.2)
Total Protein: 6.9 g/dL (ref 6.5–8.1)

## 2019-04-21 LAB — CBC WITH DIFFERENTIAL (CANCER CENTER ONLY)
Abs Immature Granulocytes: 0.02 10*3/uL (ref 0.00–0.07)
Basophils Absolute: 0.1 10*3/uL (ref 0.0–0.1)
Basophils Relative: 2 %
Eosinophils Absolute: 0.1 10*3/uL (ref 0.0–0.5)
Eosinophils Relative: 4 %
HCT: 31.5 % — ABNORMAL LOW (ref 36.0–46.0)
Hemoglobin: 9.6 g/dL — ABNORMAL LOW (ref 12.0–15.0)
Immature Granulocytes: 1 %
Lymphocytes Relative: 18 %
Lymphs Abs: 0.6 10*3/uL — ABNORMAL LOW (ref 0.7–4.0)
MCH: 26.2 pg (ref 26.0–34.0)
MCHC: 30.5 g/dL (ref 30.0–36.0)
MCV: 86.1 fL (ref 80.0–100.0)
Monocytes Absolute: 0.2 10*3/uL (ref 0.1–1.0)
Monocytes Relative: 6 %
Neutro Abs: 2.6 10*3/uL (ref 1.7–7.7)
Neutrophils Relative %: 69 %
Platelet Count: 137 10*3/uL — ABNORMAL LOW (ref 150–400)
RBC: 3.66 MIL/uL — ABNORMAL LOW (ref 3.87–5.11)
RDW: 15.5 % (ref 11.5–15.5)
WBC Count: 3.7 10*3/uL — ABNORMAL LOW (ref 4.0–10.5)
nRBC: 0 % (ref 0.0–0.2)

## 2019-04-21 MED ORDER — ONDANSETRON HCL 8 MG PO TABS
ORAL_TABLET | ORAL | Status: AC
  Filled 2019-04-21: qty 1

## 2019-04-21 MED ORDER — ONDANSETRON HCL 8 MG PO TABS
8.0000 mg | ORAL_TABLET | Freq: Once | ORAL | Status: AC
  Administered 2019-04-21: 8 mg via ORAL

## 2019-04-21 MED ORDER — BORTEZOMIB CHEMO SQ INJECTION 3.5 MG (2.5MG/ML)
1.3000 mg/m2 | Freq: Once | INTRAMUSCULAR | Status: AC
  Administered 2019-04-21: 3 mg via SUBCUTANEOUS
  Filled 2019-04-21: qty 1.2

## 2019-04-22 NOTE — Progress Notes (Signed)
Pharmacist Chemotherapy Monitoring - Follow Up Assessment    I verify that I have reviewed each item in the below checklist:  . Regimen for the patient is scheduled for the appropriate day and plan matches scheduled date. [name] Appropriate non-routine labs are ordered dependent on drug ordered. . If applicable, additional medications reviewed and ordered per protocol based on lifetime cumulative doses and/or treatment regimen.   Plan for follow-up and/or issues identified: No . I-vent associated with next due treatment: No . MD and/or nursing notified: No  RHYANNE KRINGS 04/22/2019 9:51 AM

## 2019-04-25 ENCOUNTER — Other Ambulatory Visit: Payer: Self-pay | Admitting: Internal Medicine

## 2019-04-25 DIAGNOSIS — C9000 Multiple myeloma not having achieved remission: Secondary | ICD-10-CM

## 2019-04-28 ENCOUNTER — Encounter: Payer: Self-pay | Admitting: Internal Medicine

## 2019-04-28 ENCOUNTER — Other Ambulatory Visit: Payer: Self-pay

## 2019-04-28 ENCOUNTER — Inpatient Hospital Stay: Payer: BC Managed Care – PPO | Admitting: Internal Medicine

## 2019-04-28 ENCOUNTER — Inpatient Hospital Stay: Payer: BC Managed Care – PPO

## 2019-04-28 VITALS — BP 127/59 | HR 60 | Temp 98.1°F | Resp 20 | Ht 67.0 in | Wt 257.7 lb

## 2019-04-28 DIAGNOSIS — Z5111 Encounter for antineoplastic chemotherapy: Secondary | ICD-10-CM | POA: Diagnosis not present

## 2019-04-28 DIAGNOSIS — C9000 Multiple myeloma not having achieved remission: Secondary | ICD-10-CM

## 2019-04-28 DIAGNOSIS — R5383 Other fatigue: Secondary | ICD-10-CM | POA: Diagnosis not present

## 2019-04-28 DIAGNOSIS — Z5112 Encounter for antineoplastic immunotherapy: Secondary | ICD-10-CM | POA: Diagnosis not present

## 2019-04-28 DIAGNOSIS — Z7952 Long term (current) use of systemic steroids: Secondary | ICD-10-CM | POA: Diagnosis not present

## 2019-04-28 DIAGNOSIS — Z885 Allergy status to narcotic agent status: Secondary | ICD-10-CM | POA: Diagnosis not present

## 2019-04-28 DIAGNOSIS — Z880 Allergy status to penicillin: Secondary | ICD-10-CM | POA: Diagnosis not present

## 2019-04-28 DIAGNOSIS — Z7901 Long term (current) use of anticoagulants: Secondary | ICD-10-CM | POA: Diagnosis not present

## 2019-04-28 DIAGNOSIS — Z79899 Other long term (current) drug therapy: Secondary | ICD-10-CM | POA: Diagnosis not present

## 2019-04-28 DIAGNOSIS — I10 Essential (primary) hypertension: Secondary | ICD-10-CM

## 2019-04-28 LAB — CMP (CANCER CENTER ONLY)
ALT: 48 U/L — ABNORMAL HIGH (ref 0–44)
AST: 44 U/L — ABNORMAL HIGH (ref 15–41)
Albumin: 3.6 g/dL (ref 3.5–5.0)
Alkaline Phosphatase: 129 U/L — ABNORMAL HIGH (ref 38–126)
Anion gap: 12 (ref 5–15)
BUN: 6 mg/dL (ref 6–20)
CO2: 20 mmol/L — ABNORMAL LOW (ref 22–32)
Calcium: 8.9 mg/dL (ref 8.9–10.3)
Chloride: 107 mmol/L (ref 98–111)
Creatinine: 0.79 mg/dL (ref 0.44–1.00)
GFR, Est AFR Am: >60 mL/min (ref >60)
GFR, Estimated: >60 mL/min (ref >60)
Glucose, Bld: 147 mg/dL — ABNORMAL HIGH (ref 70–99)
Potassium: 3.3 mmol/L — ABNORMAL LOW (ref 3.5–5.1)
Sodium: 139 mmol/L (ref 135–145)
Total Bilirubin: 0.6 mg/dL (ref 0.3–1.2)
Total Protein: 6.5 g/dL (ref 6.5–8.1)

## 2019-04-28 LAB — CBC WITH DIFFERENTIAL (CANCER CENTER ONLY)
Abs Immature Granulocytes: 0.03 10*3/uL (ref 0.00–0.07)
Basophils Absolute: 0 10*3/uL (ref 0.0–0.1)
Basophils Relative: 1 %
Eosinophils Absolute: 0.1 10*3/uL (ref 0.0–0.5)
Eosinophils Relative: 4 %
HCT: 29.3 % — ABNORMAL LOW (ref 36.0–46.0)
Hemoglobin: 9 g/dL — ABNORMAL LOW (ref 12.0–15.0)
Immature Granulocytes: 1 %
Lymphocytes Relative: 15 %
Lymphs Abs: 0.5 10*3/uL — ABNORMAL LOW (ref 0.7–4.0)
MCH: 26.5 pg (ref 26.0–34.0)
MCHC: 30.7 g/dL (ref 30.0–36.0)
MCV: 86.4 fL (ref 80.0–100.0)
Monocytes Absolute: 0.4 10*3/uL (ref 0.1–1.0)
Monocytes Relative: 13 %
Neutro Abs: 2.3 10*3/uL (ref 1.7–7.7)
Neutrophils Relative %: 66 %
Platelet Count: 106 10*3/uL — ABNORMAL LOW (ref 150–400)
RBC: 3.39 MIL/uL — ABNORMAL LOW (ref 3.87–5.11)
RDW: 15.8 % — ABNORMAL HIGH (ref 11.5–15.5)
WBC Count: 3.4 10*3/uL — ABNORMAL LOW (ref 4.0–10.5)
nRBC: 0 % (ref 0.0–0.2)

## 2019-04-28 MED ORDER — BORTEZOMIB CHEMO SQ INJECTION 3.5 MG (2.5MG/ML)
1.3000 mg/m2 | Freq: Once | INTRAMUSCULAR | Status: AC
  Administered 2019-04-28: 3 mg via SUBCUTANEOUS
  Filled 2019-04-28: qty 1.2

## 2019-04-28 MED ORDER — DEXAMETHASONE 4 MG PO TABS: 4.0000 mg | ORAL_TABLET | ORAL | 1 refills | Status: DC

## 2019-04-28 MED ORDER — ONDANSETRON HCL 8 MG PO TABS
8.0000 mg | ORAL_TABLET | Freq: Once | ORAL | Status: AC
  Administered 2019-04-28: 09:00:00 8 mg via ORAL

## 2019-04-28 MED ORDER — ACYCLOVIR 400 MG PO TABS: 400.0000 mg | ORAL_TABLET | Freq: Two times a day (BID) | ORAL | 1 refills | Status: DC

## 2019-04-28 MED ORDER — WARFARIN SODIUM 2 MG PO TABS: 2.0000 mg | ORAL_TABLET | Freq: Every day | ORAL | 0 refills | Status: DC

## 2019-04-28 MED ORDER — ONDANSETRON HCL 8 MG PO TABS
ORAL_TABLET | ORAL | Status: AC
  Filled 2019-04-28: qty 1

## 2019-04-28 NOTE — Progress Notes (Signed)
[place] [phone]   [phone]  OFFICE PROGRESS NOTE  [place] [id number]  DIAGNOSIS: Plasma cell dyscrasia initially diagnosed as MGUS in September 2010, with additional symptoms suggestive of POEMS syndrome.   PRIOR THERAPY:  1) Velcade 1.3 MG/M2 subcutaneously with Decadron 40 mg by mouth on a weekly basis. First cycle 11/24/2013. She status post 31 weekly doses of treatment. 2) Velcade 1.3 MG/M2 subcutaneously and weekly basis with Decadron 40 mg by mouth weekly. First dose 02/01/2015. Status post 28 cycles. 3) Revlimid 25 mg by mouth daily for 21 days every 4 weeks with weekly Decadron 20 mg. started in 11/27/2015. Status post 3 cycles discontinued secondary to lack of response.  CURRENT THERAPY: Systemic treatment with Velcade 1.3 MG/KG weekly, Revlimid 25 mg by mouth daily for 21 days every 4 weeks in addition to Decadron 20 mg by mouth weekly. First dose 03/06/2016. Status post 32 cycles.  Her treatment was on hold for the last few months.  She will resume her treatment March 17, 2019.  INTERVAL HISTORY: Brenda Conner 59 y.o. female returns to the clinic today for follow-up visit.  The patient is feeling fine today with no concerning complaints except for mild fatigue.  She has been tolerating her treatment fairly well.  She denied having any chest pain, shortness of breath, cough or hemoptysis.  She denied having any fever or chills.  The patient denied having any weight loss or night sweats.  She is here today for evaluation before starting cycle #32. Brenda Conner   MEDICAL HISTORY: Past Medical History:  Diagnosis Date  . Acid reflux   . Anxiety   . Asthma   . Cancer [facility])    waldenstroms/ macroglobinulemia  . Depression   . Depression   . Diabetes mellitus without complication ([place])   . Dysuria 02/28/2016  . Hypercholesteremia   . Hypertension   . Hypothyroidism   . Macroglobulinemia  ([place])    ? POEMS syndrome  . Multiple myeloma ([place])   . Obesity   . PONV (postoperative nausea and vomiting)   . Sleep apnea    CPAP at bedtime  . Sleep apnea     ALLERGIES:  is allergic to codeine; hydrocodone; lortab [hydrocodone-acetaminophen]; onion; shellfish allergy; and amoxicillin.  MEDICATIONS:  Current Outpatient Medications  Medication Sig Dispense Refill  . sertraline (ZOLOFT) 50 MG tablet TAKE 3 TABLETS BY MOUTH EVERY DAY 270 tablet 3  . ACCU-CHEK AVIVA PLUS test strip USE TO CHECK SUGAR TWICE A DAY 90    . acyclovir (ZOVIRAX) 400 MG tablet Take 1 tablet (400 mg total) by mouth 2 (two) times daily. 180 tablet 1  . ALPRAZolam (XANAX) 0.5 MG tablet Take 1 tablet (0.5 mg total) by mouth at bedtime as needed for anxiety. 30 tablet 1  . Azelastine HCl 0.15 % SOLN as needed.    . B-D UF III MINI PEN NEEDLES 31G X 5 MM MISC     . Blood Glucose Monitoring Suppl (ONE TOUCH ULTRA MINI) W/DEVICE KIT See admin instructions. Reported on 01/25/2015  0  . Cetirizine HCl (ZYRTEC ALLERGY PO) Take by mouth daily.    Brenda Conner dexamethasone (DECADRON) 4 MG tablet TAKE 1 TABLET (4 MG TOTAL) BY MOUTH ONCE A WEEK. PATIENT TAKES 5 TABLETS 1 DAY WEEK PRIOR TO VELCADE APPOINTMENT. 40 tablet 1  . DiphenhydrAMINE HCl (BENADRYL ALLERGY PO) Take by mouth as needed.    Brenda Conner  EPIPEN 2-PAK 0.3 MG/0.3ML SOAJ injection Reported on 06/21/2015  1  . Fexofenadine HCl (ALLEGRA ALLERGY PO) Take by mouth daily.    . Fluticasone-Salmeterol (ADVAIR) 100-50 MCG/DOSE AEPB Inhale 2 puffs into the lungs every 12 (twelve) hours.    . ibuprofen (ADVIL,MOTRIN) 100 MG tablet Take 100 mg by mouth every 6 (six) hours as needed. Reported on 05/31/2015    . lenalidomide (REVLIMID) 25 MG capsule TAKE 1 CAPSULE BY MOUTH ONCE DAILY 21 DAYS ON AND 7 DAYS OFF auth number [id number] adult female not of childbearing potential. 21 capsule 0  . levothyroxine (SYNTHROID, LEVOTHROID) 150 MCG tablet Take 150 mcg by mouth daily before breakfast.     . lisinopril (PRINIVIL,ZESTRIL) 10 MG tablet Take 10 mg by mouth daily. auth number 05/29/2018 [id number]  3  . loperamide (IMODIUM) 2 MG capsule Take 2 mg by mouth as needed for diarrhea or loose stools.    . magic mouthwash w/lidocaine SOLN Take 5 mLs by mouth 4 (four) times daily as needed for mouth pain. Swish, Gargle, and spit 240 mL 1  . metFORMIN (GLUCOPHAGE-XR) 500 MG 24 hr tablet Take 500 mg by mouth daily.    . omeprazole (PRILOSEC) 40 MG capsule Take 40 mg by mouth 2 (two) times daily.     . ondansetron (ZOFRAN-ODT) 8 MG disintegrating tablet Take 1 tablet (8 mg total) by mouth every 8 (eight) hours as needed. Reported on 05/31/2015 30 tablet 2  . pravastatin (PRAVACHOL) 40 MG tablet Take 40 mg by mouth every evening.     . PROAIR HFA 108 (90 Base) MCG/ACT inhaler Reported on 06/21/2015  1  . verapamil (CALAN-SR) 240 MG CR tablet Take 240 mg by mouth 2 (two) times daily.    . warfarin (COUMADIN) 2 MG tablet TAKE 1 TABLET BY MOUTH EVERY DAY 90 tablet 0   No current facility-administered medications for this visit.    SURGICAL HISTORY:  Past Surgical History:  Procedure Laterality Date  . ABLATION  09/2007   HTA and polyp resection  . BACK SURGERY  03/2004   herniation, L4-L5  . Bil Laprascopic knee surgery    . BONE MARROW BIOPSY     2011  . BONE MARROW BIOPSY  4/14  . CHOLECYSTECTOMY    . FOOT SURGERY  1998  . KNEE ARTHROSCOPY W/ MENISCAL REPAIR  10/10 ; 3/11  . LAPAROSCOPIC CHOLECYSTECTOMY  1997  . NASAL SINUS SURGERY  2004  . UTERINE FIBROID EMBOLIZATION      REVIEW OF SYSTEMS:  A comprehensive review of systems was negative except for: Constitutional: positive for fatigue   PHYSICAL EXAMINATION: General appearance: alert, cooperative, fatigued and no distress Head: Normocephalic, without obvious abnormality, atraumatic Neck: no adenopathy Lymph nodes: Cervical, supraclavicular, and axillary nodes normal. Resp: clear to auscultation bilaterally Back: symmetric, no  curvature. ROM normal. No CVA tenderness. Cardio: regular rate and rhythm, S1, S2 normal, no murmur, click, rub or gallop GI: soft, non-tender; bowel sounds normal; no masses,  no organomegaly Extremities: extremities normal, atraumatic, no cyanosis or edema  ECOG PERFORMANCE STATUS: 1 - Symptomatic but completely ambulatory  Blood pressure (!) 127/59, pulse 60, temperature 98.1 F (36.7 C), resp. rate 20, height 5' 7" (1.702 m), weight 257 lb 11.2 oz (116.9 kg), SpO2 100 %.  LABORATORY DATA: Lab Results  Component Value Date   WBC 3.4 (L) 04/28/2019   HGB 9.0 (L) 04/28/2019   HCT 29.3 (L) 04/28/2019   MCV 86.4 04/28/2019   PLT 106 (L)   04/28/2019      Chemistry      Component Value Date/Time   NA 138 04/21/2019 0812   NA 138 11/14/2016 0824   K 3.7 04/21/2019 0812   K 4.3 11/14/2016 0824   CL 107 04/21/2019 0812   CL 104 04/07/2012 1415   CO2 19 (L) 04/21/2019 0812   CO2 23 11/14/2016 0824   BUN 7 04/21/2019 0812   BUN 9.1 11/14/2016 0824   CREATININE 0.81 04/21/2019 0812   CREATININE 0.7 11/14/2016 0824      Component Value Date/Time   CALCIUM 9.0 04/21/2019 0812   CALCIUM 9.6 11/14/2016 0824   ALKPHOS 123 04/21/2019 0812   ALKPHOS 94 11/14/2016 0824   AST 42 (H) 04/21/2019 0812   AST 28 11/14/2016 0824   ALT 43 04/21/2019 0812   ALT 24 11/14/2016 0824   BILITOT 0.6 04/21/2019 0812   BILITOT 0.41 11/14/2016 0824      ASSESSMENT AND PLAN:  This is a very pleasant 58 years old white female with multiple myeloma status post several chemotherapy regimens. The patient has been on treatment with subcutaneous weekly Velcade as well as Revlimid and Decadron status post 32 cycles.  The patient continues to tolerate her treatment well with no concerning adverse effects except for mild fatigue. I recommended for her to continue her treatment as planned.  I will give her a refill for Decadron, Coumadin and acyclovir today. She will come back for follow-up visit in 4 weeks  for evaluation. The patient was advised to call immediately if she has any concerning symptoms in the interval. All questions were answered. The patient knows to call the clinic with any problems, questions or concerns. We can certainly see the patient much sooner if necessary.  Disclaimer: This note was dictated with voice recognition software. Similar sounding words can inadvertently be transcribed and may not be corrected upon review.

## 2019-04-28 NOTE — Patient Instructions (Signed)
[facility] Discharge Instructions for Patients Receiving Chemotherapy  Today you received the following chemotherapy agents Velcade.  To help prevent nausea and vomiting after your treatment, we encourage you to take your nausea medication as directed.  If you develop nausea and vomiting that is not controlled by your nausea medication, call the clinic.   BELOW ARE SYMPTOMS THAT SHOULD BE REPORTED IMMEDIATELY:  *FEVER GREATER THAN 100.5 F  *CHILLS WITH OR WITHOUT FEVER  NAUSEA AND VOMITING THAT IS NOT CONTROLLED WITH YOUR NAUSEA MEDICATION  *UNUSUAL SHORTNESS OF BREATH  *UNUSUAL BRUISING OR BLEEDING  TENDERNESS IN MOUTH AND THROAT WITH OR WITHOUT PRESENCE OF ULCERS  *URINARY PROBLEMS  *BOWEL PROBLEMS  UNUSUAL RASH Items with * indicate a potential emergency and should be followed up as soon as possible.  Feel free to call the clinic should you have any questions or concerns. The clinic phone number is [phone].  Please show the CHEMO ALERT CARD at check-in to the Emergency Department and triage nurse.   

## 2019-04-29 ENCOUNTER — Ambulatory Visit: Payer: BC Managed Care – PPO | Attending: Internal Medicine

## 2019-04-29 DIAGNOSIS — Z23 Encounter for immunization: Secondary | ICD-10-CM

## 2019-04-29 NOTE — Progress Notes (Signed)
   Covid-19 Vaccination Clinic  Name:  Brenda Conner    MRN: [id number] DOB: 01-31-1960  04/29/2019  Ms. Prime was observed post Covid-19 immunization for 15 minutes without incident. She was provided with Vaccine Information Sheet and instruction to access the V-Safe system.   [name] was instructed to call 911 with any severe reactions post vaccine: [name] Difficulty breathing  . Swelling of face and throat  . A fast heartbeat  . A bad rash all over body  . Dizziness and weakness   Immunizations Administered    Name Date Dose VIS Date Route   Moderna COVID-19 Vaccine 04/29/2019 12:52 PM 0.5 mL 12/2018 Intramuscular   Manufacturer: Moderna   Lot: [id number]   [id number]

## 2019-04-29 NOTE — Progress Notes (Signed)
Pharmacist Chemotherapy Monitoring - Follow Up Assessment    I verify that I have reviewed each item in the below checklist:  . Regimen for the patient is scheduled for the appropriate day and plan matches scheduled date. [name] Appropriate non-routine labs are ordered dependent on drug ordered. . If applicable, additional medications reviewed and ordered per protocol based on lifetime cumulative doses and/or treatment regimen.   Plan for follow-up and/or issues identified: No . I-vent associated with next due treatment: No . MD and/or nursing notified: No  Romualdo Bolk [facility] 04/29/2019 1:59 PM

## 2019-04-30 ENCOUNTER — Other Ambulatory Visit: Payer: Self-pay | Admitting: Internal Medicine

## 2019-04-30 DIAGNOSIS — C9000 Multiple myeloma not having achieved remission: Secondary | ICD-10-CM

## 2019-04-30 MED ORDER — LENALIDOMIDE 25 MG PO CAPS: ORAL_CAPSULE | ORAL | 0 refills | Status: DC

## 2019-04-30 NOTE — Addendum Note (Signed)
Addended by: Ardeen Garland on: 04/30/2019 12:34 PM   Modules accepted: Orders

## 2019-05-05 ENCOUNTER — Inpatient Hospital Stay: Payer: BC Managed Care – PPO

## 2019-05-05 ENCOUNTER — Other Ambulatory Visit: Payer: Self-pay

## 2019-05-05 VITALS — BP 105/55 | HR 56 | Temp 97.8°F | Resp 18 | Wt 258.0 lb

## 2019-05-05 DIAGNOSIS — Z5112 Encounter for antineoplastic immunotherapy: Secondary | ICD-10-CM | POA: Diagnosis not present

## 2019-05-05 DIAGNOSIS — Z880 Allergy status to penicillin: Secondary | ICD-10-CM | POA: Diagnosis not present

## 2019-05-05 DIAGNOSIS — C9000 Multiple myeloma not having achieved remission: Secondary | ICD-10-CM

## 2019-05-05 DIAGNOSIS — Z7952 Long term (current) use of systemic steroids: Secondary | ICD-10-CM | POA: Diagnosis not present

## 2019-05-05 DIAGNOSIS — Z885 Allergy status to narcotic agent status: Secondary | ICD-10-CM | POA: Diagnosis not present

## 2019-05-05 DIAGNOSIS — Z7901 Long term (current) use of anticoagulants: Secondary | ICD-10-CM | POA: Diagnosis not present

## 2019-05-05 DIAGNOSIS — Z79899 Other long term (current) drug therapy: Secondary | ICD-10-CM | POA: Diagnosis not present

## 2019-05-05 DIAGNOSIS — R5383 Other fatigue: Secondary | ICD-10-CM | POA: Diagnosis not present

## 2019-05-05 DIAGNOSIS — I10 Essential (primary) hypertension: Secondary | ICD-10-CM | POA: Diagnosis not present

## 2019-05-05 LAB — CBC WITH DIFFERENTIAL (CANCER CENTER ONLY)
Abs Immature Granulocytes: 0.03 10*3/uL (ref 0.00–0.07)
Basophils Absolute: 0 10*3/uL (ref 0.0–0.1)
Basophils Relative: 1 %
Eosinophils Absolute: 0.2 10*3/uL (ref 0.0–0.5)
Eosinophils Relative: 8 %
HCT: 28.9 % — ABNORMAL LOW (ref 36.0–46.0)
Hemoglobin: 8.8 g/dL — ABNORMAL LOW (ref 12.0–15.0)
Immature Granulocytes: 1 %
Lymphocytes Relative: 18 %
Lymphs Abs: 0.5 10*3/uL — ABNORMAL LOW (ref 0.7–4.0)
MCH: 26.2 pg (ref 26.0–34.0)
MCHC: 30.4 g/dL (ref 30.0–36.0)
MCV: 86 fL (ref 80.0–100.0)
Monocytes Absolute: 0.3 10*3/uL (ref 0.1–1.0)
Monocytes Relative: 10 %
Neutro Abs: 1.7 10*3/uL (ref 1.7–7.7)
Neutrophils Relative %: 62 %
Platelet Count: 117 10*3/uL — ABNORMAL LOW (ref 150–400)
RBC: 3.36 MIL/uL — ABNORMAL LOW (ref 3.87–5.11)
RDW: 16.2 % — ABNORMAL HIGH (ref 11.5–15.5)
WBC Count: 2.7 10*3/uL — ABNORMAL LOW (ref 4.0–10.5)
nRBC: 0 % (ref 0.0–0.2)

## 2019-05-05 LAB — CMP (CANCER CENTER ONLY)
ALT: 46 U/L — ABNORMAL HIGH (ref 0–44)
AST: 47 U/L — ABNORMAL HIGH (ref 15–41)
Albumin: 3.5 g/dL (ref 3.5–5.0)
Alkaline Phosphatase: 146 U/L — ABNORMAL HIGH (ref 38–126)
Anion gap: 14 (ref 5–15)
BUN: 9 mg/dL (ref 6–20)
CO2: 22 mmol/L (ref 22–32)
Calcium: 8.9 mg/dL (ref 8.9–10.3)
Chloride: 106 mmol/L (ref 98–111)
Creatinine: 0.79 mg/dL (ref 0.44–1.00)
GFR, Est AFR Am: >60 mL/min (ref >60)
GFR, Estimated: >60 mL/min (ref >60)
Glucose, Bld: 143 mg/dL — ABNORMAL HIGH (ref 70–99)
Potassium: 3.4 mmol/L — ABNORMAL LOW (ref 3.5–5.1)
Sodium: 142 mmol/L (ref 135–145)
Total Bilirubin: 0.8 mg/dL (ref 0.3–1.2)
Total Protein: 6.5 g/dL (ref 6.5–8.1)

## 2019-05-05 MED ORDER — ONDANSETRON HCL 8 MG PO TABS
8.0000 mg | ORAL_TABLET | Freq: Once | ORAL | Status: AC
  Administered 2019-05-05: 8 mg via ORAL

## 2019-05-05 MED ORDER — ONDANSETRON HCL 8 MG PO TABS
ORAL_TABLET | ORAL | Status: AC
  Filled 2019-05-05: qty 1

## 2019-05-05 MED ORDER — BORTEZOMIB CHEMO SQ INJECTION 3.5 MG (2.5MG/ML)
1.3000 mg/m2 | Freq: Once | INTRAMUSCULAR | Status: AC
  Administered 2019-05-05: 3 mg via SUBCUTANEOUS
  Filled 2019-05-05: qty 1.2

## 2019-05-05 NOTE — Patient Instructions (Signed)
[facility] Discharge Instructions for Patients Receiving Chemotherapy  Today you received the following chemotherapy agents Velcade.  To help prevent nausea and vomiting after your treatment, we encourage you to take your nausea medication as directed.  If you develop nausea and vomiting that is not controlled by your nausea medication, call the clinic.   BELOW ARE SYMPTOMS THAT SHOULD BE REPORTED IMMEDIATELY:  *FEVER GREATER THAN 100.5 F  *CHILLS WITH OR WITHOUT FEVER  NAUSEA AND VOMITING THAT IS NOT CONTROLLED WITH YOUR NAUSEA MEDICATION  *UNUSUAL SHORTNESS OF BREATH  *UNUSUAL BRUISING OR BLEEDING  TENDERNESS IN MOUTH AND THROAT WITH OR WITHOUT PRESENCE OF ULCERS  *URINARY PROBLEMS  *BOWEL PROBLEMS  UNUSUAL RASH Items with * indicate a potential emergency and should be followed up as soon as possible.  Feel free to call the clinic should you have any questions or concerns. The clinic phone number is [phone].  Please show the CHEMO ALERT CARD at check-in to the Emergency Department and triage nurse.   

## 2019-05-06 NOTE — Progress Notes (Signed)
Pharmacist Chemotherapy Monitoring - Follow Up Assessment    I verify that I have reviewed each item in the below checklist:  . Regimen for the patient is scheduled for the appropriate day and plan matches scheduled date. [name] Appropriate non-routine labs are ordered dependent on drug ordered. . If applicable, additional medications reviewed and ordered per protocol based on lifetime cumulative doses and/or treatment regimen.   Plan for follow-up and/or issues identified: No . I-vent associated with next due treatment: No . MD and/or nursing notified: No  Brenda Conner 05/06/2019 12:24 PM

## 2019-05-12 ENCOUNTER — Inpatient Hospital Stay: Payer: BC Managed Care – PPO

## 2019-05-12 ENCOUNTER — Inpatient Hospital Stay: Payer: BC Managed Care – PPO | Attending: Internal Medicine

## 2019-05-12 ENCOUNTER — Other Ambulatory Visit: Payer: Self-pay

## 2019-05-12 VITALS — BP 113/49 | HR 57 | Temp 97.8°F | Resp 20

## 2019-05-12 DIAGNOSIS — R197 Diarrhea, unspecified: Secondary | ICD-10-CM | POA: Diagnosis not present

## 2019-05-12 DIAGNOSIS — I10 Essential (primary) hypertension: Secondary | ICD-10-CM | POA: Diagnosis not present

## 2019-05-12 DIAGNOSIS — Z79899 Other long term (current) drug therapy: Secondary | ICD-10-CM | POA: Diagnosis not present

## 2019-05-12 DIAGNOSIS — C9000 Multiple myeloma not having achieved remission: Secondary | ICD-10-CM | POA: Insufficient documentation

## 2019-05-12 DIAGNOSIS — K5900 Constipation, unspecified: Secondary | ICD-10-CM | POA: Insufficient documentation

## 2019-05-12 DIAGNOSIS — Z7952 Long term (current) use of systemic steroids: Secondary | ICD-10-CM | POA: Insufficient documentation

## 2019-05-12 DIAGNOSIS — R531 Weakness: Secondary | ICD-10-CM | POA: Insufficient documentation

## 2019-05-12 DIAGNOSIS — Z885 Allergy status to narcotic agent status: Secondary | ICD-10-CM | POA: Diagnosis not present

## 2019-05-12 DIAGNOSIS — Z7901 Long term (current) use of anticoagulants: Secondary | ICD-10-CM | POA: Diagnosis not present

## 2019-05-12 DIAGNOSIS — Z5112 Encounter for antineoplastic immunotherapy: Secondary | ICD-10-CM | POA: Diagnosis not present

## 2019-05-12 DIAGNOSIS — Z880 Allergy status to penicillin: Secondary | ICD-10-CM | POA: Insufficient documentation

## 2019-05-12 DIAGNOSIS — R5383 Other fatigue: Secondary | ICD-10-CM | POA: Insufficient documentation

## 2019-05-12 DIAGNOSIS — M791 Myalgia, unspecified site: Secondary | ICD-10-CM | POA: Insufficient documentation

## 2019-05-12 DIAGNOSIS — D472 Monoclonal gammopathy: Secondary | ICD-10-CM | POA: Insufficient documentation

## 2019-05-12 DIAGNOSIS — G629 Polyneuropathy, unspecified: Secondary | ICD-10-CM | POA: Diagnosis not present

## 2019-05-12 DIAGNOSIS — R110 Nausea: Secondary | ICD-10-CM | POA: Diagnosis not present

## 2019-05-12 LAB — CMP (CANCER CENTER ONLY)
ALT: 41 U/L (ref 0–44)
AST: 52 U/L — ABNORMAL HIGH (ref 15–41)
Albumin: 3.6 g/dL (ref 3.5–5.0)
Alkaline Phosphatase: 134 U/L — ABNORMAL HIGH (ref 38–126)
Anion gap: 9 (ref 5–15)
BUN: 9 mg/dL (ref 6–20)
CO2: 21 mmol/L — ABNORMAL LOW (ref 22–32)
Calcium: 8.9 mg/dL (ref 8.9–10.3)
Chloride: 110 mmol/L (ref 98–111)
Creatinine: 0.78 mg/dL (ref 0.44–1.00)
GFR, Est AFR Am: >60 mL/min (ref >60)
GFR, Estimated: >60 mL/min (ref >60)
Glucose, Bld: 136 mg/dL — ABNORMAL HIGH (ref 70–99)
Potassium: 4.5 mmol/L (ref 3.5–5.1)
Sodium: 140 mmol/L (ref 135–145)
Total Bilirubin: 0.6 mg/dL (ref 0.3–1.2)
Total Protein: 6.8 g/dL (ref 6.5–8.1)

## 2019-05-12 LAB — CBC WITH DIFFERENTIAL (CANCER CENTER ONLY)
Abs Immature Granulocytes: 0.01 10*3/uL (ref 0.00–0.07)
Basophils Absolute: 0.1 10*3/uL (ref 0.0–0.1)
Basophils Relative: 3 %
Eosinophils Absolute: 0.1 10*3/uL (ref 0.0–0.5)
Eosinophils Relative: 3 %
HCT: 30.1 % — ABNORMAL LOW (ref 36.0–46.0)
Hemoglobin: 9.1 g/dL — ABNORMAL LOW (ref 12.0–15.0)
Immature Granulocytes: 0 %
Lymphocytes Relative: 22 %
Lymphs Abs: 0.6 10*3/uL — ABNORMAL LOW (ref 0.7–4.0)
MCH: 26.4 pg (ref 26.0–34.0)
MCHC: 30.2 g/dL (ref 30.0–36.0)
MCV: 87.2 fL (ref 80.0–100.0)
Monocytes Absolute: 0.3 10*3/uL (ref 0.1–1.0)
Monocytes Relative: 12 %
Neutro Abs: 1.7 10*3/uL (ref 1.7–7.7)
Neutrophils Relative %: 60 %
Platelet Count: 129 10*3/uL — ABNORMAL LOW (ref 150–400)
RBC: 3.45 MIL/uL — ABNORMAL LOW (ref 3.87–5.11)
RDW: 16.6 % — ABNORMAL HIGH (ref 11.5–15.5)
WBC Count: 2.9 10*3/uL — ABNORMAL LOW (ref 4.0–10.5)
nRBC: 0 % (ref 0.0–0.2)

## 2019-05-12 MED ORDER — ONDANSETRON HCL 8 MG PO TABS
8.0000 mg | ORAL_TABLET | Freq: Once | ORAL | Status: AC
  Administered 2019-05-12: 8 mg via ORAL

## 2019-05-12 MED ORDER — ONDANSETRON HCL 8 MG PO TABS
ORAL_TABLET | ORAL | Status: AC
  Filled 2019-05-12: qty 1

## 2019-05-12 MED ORDER — BORTEZOMIB CHEMO SQ INJECTION 3.5 MG (2.5MG/ML)
1.3000 mg/m2 | Freq: Once | INTRAMUSCULAR | Status: AC
  Administered 2019-05-12: 09:00:00 3 mg via SUBCUTANEOUS
  Filled 2019-05-12: qty 1.2

## 2019-05-12 NOTE — Patient Instructions (Signed)
[facility] Discharge Instructions for Patients Receiving Chemotherapy  Today you received the following chemotherapy agents velcade  To help prevent nausea and vomiting after your treatment, we encourage you to take your nausea medication as directed.   If you develop nausea and vomiting that is not controlled by your nausea medication, call the clinic.   BELOW ARE SYMPTOMS THAT SHOULD BE REPORTED IMMEDIATELY:  *FEVER GREATER THAN 100.5 F  *CHILLS WITH OR WITHOUT FEVER  NAUSEA AND VOMITING THAT IS NOT CONTROLLED WITH YOUR NAUSEA MEDICATION  *UNUSUAL SHORTNESS OF BREATH  *UNUSUAL BRUISING OR BLEEDING  TENDERNESS IN MOUTH AND THROAT WITH OR WITHOUT PRESENCE OF ULCERS  *URINARY PROBLEMS  *BOWEL PROBLEMS  UNUSUAL RASH Items with * indicate a potential emergency and should be followed up as soon as possible.  Feel free to call the clinic should you have any questions or concerns. The clinic phone number is [phone].  Please show the CHEMO ALERT CARD at check-in to the Emergency Department and triage nurse.   

## 2019-05-20 ENCOUNTER — Inpatient Hospital Stay: Payer: BC Managed Care – PPO

## 2019-05-20 ENCOUNTER — Ambulatory Visit: Payer: BC Managed Care – PPO | Admitting: Internal Medicine

## 2019-05-20 ENCOUNTER — Other Ambulatory Visit: Payer: Self-pay

## 2019-05-20 ENCOUNTER — Other Ambulatory Visit: Payer: BC Managed Care – PPO

## 2019-05-20 VITALS — BP 134/58 | HR 59 | Temp 98.0°F | Resp 18 | Wt 255.5 lb

## 2019-05-20 DIAGNOSIS — Z880 Allergy status to penicillin: Secondary | ICD-10-CM | POA: Diagnosis not present

## 2019-05-20 DIAGNOSIS — Z79899 Other long term (current) drug therapy: Secondary | ICD-10-CM | POA: Diagnosis not present

## 2019-05-20 DIAGNOSIS — K5900 Constipation, unspecified: Secondary | ICD-10-CM | POA: Diagnosis not present

## 2019-05-20 DIAGNOSIS — R110 Nausea: Secondary | ICD-10-CM | POA: Diagnosis not present

## 2019-05-20 DIAGNOSIS — C9000 Multiple myeloma not having achieved remission: Secondary | ICD-10-CM

## 2019-05-20 DIAGNOSIS — R531 Weakness: Secondary | ICD-10-CM | POA: Diagnosis not present

## 2019-05-20 DIAGNOSIS — Z7901 Long term (current) use of anticoagulants: Secondary | ICD-10-CM | POA: Diagnosis not present

## 2019-05-20 DIAGNOSIS — M791 Myalgia, unspecified site: Secondary | ICD-10-CM | POA: Diagnosis not present

## 2019-05-20 DIAGNOSIS — D472 Monoclonal gammopathy: Secondary | ICD-10-CM | POA: Diagnosis not present

## 2019-05-20 DIAGNOSIS — I10 Essential (primary) hypertension: Secondary | ICD-10-CM | POA: Diagnosis not present

## 2019-05-20 DIAGNOSIS — R197 Diarrhea, unspecified: Secondary | ICD-10-CM | POA: Diagnosis not present

## 2019-05-20 DIAGNOSIS — Z5112 Encounter for antineoplastic immunotherapy: Secondary | ICD-10-CM | POA: Diagnosis not present

## 2019-05-20 DIAGNOSIS — Z885 Allergy status to narcotic agent status: Secondary | ICD-10-CM | POA: Diagnosis not present

## 2019-05-20 DIAGNOSIS — Z7952 Long term (current) use of systemic steroids: Secondary | ICD-10-CM | POA: Diagnosis not present

## 2019-05-20 DIAGNOSIS — R5383 Other fatigue: Secondary | ICD-10-CM | POA: Diagnosis not present

## 2019-05-20 DIAGNOSIS — G629 Polyneuropathy, unspecified: Secondary | ICD-10-CM | POA: Diagnosis not present

## 2019-05-20 LAB — CMP (CANCER CENTER ONLY)
ALT: 32 U/L (ref 0–44)
AST: 33 U/L (ref 15–41)
Albumin: 3.6 g/dL (ref 3.5–5.0)
Alkaline Phosphatase: 126 U/L (ref 38–126)
Anion gap: 15 (ref 5–15)
BUN: 6 mg/dL (ref 6–20)
CO2: 21 mmol/L — ABNORMAL LOW (ref 22–32)
Calcium: 9 mg/dL (ref 8.9–10.3)
Chloride: 106 mmol/L (ref 98–111)
Creatinine: 0.77 mg/dL (ref 0.44–1.00)
GFR, Est AFR Am: >60 mL/min (ref >60)
GFR, Estimated: >60 mL/min (ref >60)
Glucose, Bld: 141 mg/dL — ABNORMAL HIGH (ref 70–99)
Potassium: 3.5 mmol/L (ref 3.5–5.1)
Sodium: 142 mmol/L (ref 135–145)
Total Bilirubin: 0.8 mg/dL (ref 0.3–1.2)
Total Protein: 6.7 g/dL (ref 6.5–8.1)

## 2019-05-20 LAB — CBC WITH DIFFERENTIAL (CANCER CENTER ONLY)
Abs Immature Granulocytes: 0.01 10*3/uL (ref 0.00–0.07)
Basophils Absolute: 0 10*3/uL (ref 0.0–0.1)
Basophils Relative: 2 %
Eosinophils Absolute: 0.2 10*3/uL (ref 0.0–0.5)
Eosinophils Relative: 6 %
HCT: 30.9 % — ABNORMAL LOW (ref 36.0–46.0)
Hemoglobin: 9.3 g/dL — ABNORMAL LOW (ref 12.0–15.0)
Immature Granulocytes: 0 %
Lymphocytes Relative: 16 %
Lymphs Abs: 0.4 10*3/uL — ABNORMAL LOW (ref 0.7–4.0)
MCH: 26.1 pg (ref 26.0–34.0)
MCHC: 30.1 g/dL (ref 30.0–36.0)
MCV: 86.8 fL (ref 80.0–100.0)
Monocytes Absolute: 0.2 10*3/uL (ref 0.1–1.0)
Monocytes Relative: 6 %
Neutro Abs: 1.9 10*3/uL (ref 1.7–7.7)
Neutrophils Relative %: 70 %
Platelet Count: 126 10*3/uL — ABNORMAL LOW (ref 150–400)
RBC: 3.56 MIL/uL — ABNORMAL LOW (ref 3.87–5.11)
RDW: 16.2 % — ABNORMAL HIGH (ref 11.5–15.5)
WBC Count: 2.7 10*3/uL — ABNORMAL LOW (ref 4.0–10.5)
nRBC: 0 % (ref 0.0–0.2)

## 2019-05-20 MED ORDER — ONDANSETRON HCL 8 MG PO TABS
ORAL_TABLET | ORAL | Status: AC
  Filled 2019-05-20: qty 1

## 2019-05-20 MED ORDER — ONDANSETRON HCL 8 MG PO TABS
8.0000 mg | ORAL_TABLET | Freq: Once | ORAL | Status: AC
  Administered 2019-05-20: 8 mg via ORAL

## 2019-05-20 MED ORDER — BORTEZOMIB CHEMO SQ INJECTION 3.5 MG (2.5MG/ML)
1.3000 mg/m2 | Freq: Once | INTRAMUSCULAR | Status: AC
  Administered 2019-05-20: 3 mg via SUBCUTANEOUS
  Filled 2019-05-20: qty 1.2

## 2019-05-20 NOTE — Patient Instructions (Signed)
[facility] Discharge Instructions for Patients Receiving Chemotherapy  Today you received the following chemotherapy agents Velcade.  To help prevent nausea and vomiting after your treatment, we encourage you to take your nausea medication as directed.  If you develop nausea and vomiting that is not controlled by your nausea medication, call the clinic.   BELOW ARE SYMPTOMS THAT SHOULD BE REPORTED IMMEDIATELY:  *FEVER GREATER THAN 100.5 F  *CHILLS WITH OR WITHOUT FEVER  NAUSEA AND VOMITING THAT IS NOT CONTROLLED WITH YOUR NAUSEA MEDICATION  *UNUSUAL SHORTNESS OF BREATH  *UNUSUAL BRUISING OR BLEEDING  TENDERNESS IN MOUTH AND THROAT WITH OR WITHOUT PRESENCE OF ULCERS  *URINARY PROBLEMS  *BOWEL PROBLEMS  UNUSUAL RASH Items with * indicate a potential emergency and should be followed up as soon as possible.  Feel free to call the clinic should you have any questions or concerns. The clinic phone number is [phone].  Please show the CHEMO ALERT CARD at check-in to the Emergency Department and triage nurse.   

## 2019-05-20 NOTE — Progress Notes (Signed)
Pharmacist Chemotherapy Monitoring - Follow Up Assessment    I verify that I have reviewed each item in the below checklist:  . Regimen for the patient is scheduled for the appropriate day and plan matches scheduled date. [name] Appropriate non-routine labs are ordered dependent on drug ordered. . If applicable, additional medications reviewed and ordered per protocol based on lifetime cumulative doses and/or treatment regimen.   Plan for follow-up and/or issues identified: No . I-vent associated with next due treatment: No . MD and/or nursing notified: No  Brenda Conner D 05/20/2019 4:18 PM

## 2019-05-24 NOTE — Progress Notes (Signed)
[facility] OFFICE PROGRESS NOTE  [name], MD [place] [id number]  DIAGNOSIS: Plasma cell dyscrasia initially diagnosed as MGUS in September 2010, with additional symptoms suggestive of POEMS syndrome.   PRIOR THERAPY: 1) Velcade 1.3 MG/M2 subcutaneously with Decadron 40 mg by mouth on a weekly basis. First cycle 11/24/2013. She status post 31 weekly doses of treatment. 2) Velcade 1.3 MG/M2 subcutaneously and weekly basis with Decadron 40 mg by mouth weekly. First dose 02/01/2015. Status post 28 cycles. 3) Revlimid 25 mg by mouth daily for 21 days every 4 weeks with weekly Decadron 20 mg. started in 11/27/2015. Status post 3 cycles discontinued secondary to lack of response.  CURRENT THERAPY: Systemic treatment with Velcade 1.3 MG/KG weekly, Revlimid 25 mg by mouth daily for 21 days every 4 weeks in addition to Decadron 20 mg by mouth weekly. First dose 03/06/2016. Status post 30 cycles. Her treatmentwas on hold for the last few months. She will resume her treatment March 17, 2019. Status post 9 cycles since restarting.   INTERVAL HISTORY: Brenda Conner 59 y.o. female returns to the clinic today for a follow-up visit.  The patient had been on observation for several months and had a repeat myeloma panel performed in February 2021 which showed evidence of disease progression.  She recently restarted her treatment with Velcade.  She is status post 9 cycles of weekly Velcade since restarting and she tolerated it fairly well without any concerning complaints except for fatigue and mild nausea for which she uses sublingual zofran.  She denies any recent fever, chills, night sweats, weight loss, or lymphadenopathy. She has stable peripheral neuropathy. She denies any signs and symptoms of infection including sore throat, cough, shortness of breath, skin infections, or dysuria.  She denies any abnormal bleeding or bruising plan.  She denies any vomiting.  She reports her baseline alternating between constipation and diarrhea. She is taking her Revlimid as prescribed. She states she has one more week of Revlimid before it is her off week.  She is here today for evaluation before starting cycle #10  MEDICAL HISTORY: Past Medical History:  Diagnosis Date  . Acid reflux   . Anxiety   . Asthma   . Cancer [facility])    waldenstroms/ macroglobinulemia  . Depression   . Depression   . Diabetes mellitus without complication ([place])   . Dysuria 02/28/2016  . Hypercholesteremia   . Hypertension   . Hypothyroidism   . Macroglobulinemia ([place])    ? POEMS syndrome  . Multiple myeloma ([place])   . Obesity   . PONV (postoperative nausea and vomiting)   . Sleep apnea    CPAP at bedtime  . Sleep apnea     ALLERGIES:  is allergic to codeine; hydrocodone; lortab [hydrocodone-acetaminophen]; onion; shellfish allergy; and amoxicillin.  MEDICATIONS:  Current Outpatient Medications  Medication Sig Dispense Refill  . sertraline (ZOLOFT) 50 MG tablet TAKE 3 TABLETS BY MOUTH EVERY DAY 270 tablet 3  . ACCU-CHEK AVIVA PLUS test strip USE TO CHECK SUGAR TWICE A DAY 90    . acyclovir (ZOVIRAX) 400 MG tablet Take 1 tablet (400 mg total) by mouth 2 (two) times daily. 180 tablet 1  . ALPRAZolam (XANAX) 0.5 MG tablet Take 1 tablet (0.5 mg total) by mouth at bedtime as needed for anxiety. 30 tablet 1  . Azelastine HCl 0.15 % SOLN as needed.    . B-D UF III MINI PEN NEEDLES 31G X  5 MM MISC     . Blood Glucose Monitoring Suppl (ONE TOUCH ULTRA MINI) W/DEVICE KIT See admin instructions. Reported on 01/25/2015  0  . Cetirizine HCl (ZYRTEC ALLERGY PO) Take by mouth daily.    [name] dexamethasone (DECADRON) 4 MG tablet Take 1 tablet (4 mg total) by mouth once a week. Patient takes 5 tablets 1 day week prior to [facility] appointment. 40 tablet 1  . DiphenhydrAMINE HCl (BENADRYL ALLERGY PO) Take by mouth as needed.    [name] EPIPEN 2-PAK 0.3 MG/0.3ML SOAJ injection Reported on 06/21/2015  1  .  Fexofenadine HCl (ALLEGRA ALLERGY PO) Take by mouth daily.    . Fluticasone-Salmeterol (ADVAIR) 100-50 MCG/DOSE AEPB Inhale 2 puffs into the lungs every 12 (twelve) hours.    [name] ibuprofen (ADVIL,MOTRIN) 100 MG tablet Take 100 mg by mouth every 6 (six) hours as needed. Reported on 05/31/2015    . lenalidomide (REVLIMID) 25 MG capsule TAKE 1 CAPSULE BY MOUTH ONCE DAILY 21 DAYS ON AND 7 DAYS OFF 04/30/19 auth number [id number] Adult female not of childbearing potential 21 capsule 0  . levothyroxine (SYNTHROID, LEVOTHROID) 150 MCG tablet Take 150 mcg by mouth daily before breakfast.    . lisinopril (PRINIVIL,ZESTRIL) 10 MG tablet Take 10 mg by mouth daily. auth number 05/29/2018 [id number]  3  . loperamide (IMODIUM) 2 MG capsule Take 2 mg by mouth as needed for diarrhea or loose stools.    . magic mouthwash w/lidocaine SOLN Take 5 mLs by mouth 4 (four) times daily as needed for mouth pain. Swish, Gargle, and spit 240 mL 1  . metFORMIN (GLUCOPHAGE-XR) 500 MG 24 hr tablet Take 500 mg by mouth daily.    [name] omeprazole (PRILOSEC) 40 MG capsule Take 40 mg by mouth 2 (two) times daily.     . ondansetron (ZOFRAN-ODT) 8 MG disintegrating tablet Take 1 tablet (8 mg total) by mouth every 8 (eight) hours as needed. Reported on 05/31/2015 30 tablet 2  . pravastatin (PRAVACHOL) 40 MG tablet Take 40 mg by mouth every evening.     [name] PROAIR HFA 108 (90 Base) MCG/ACT inhaler Reported on 06/21/2015  1  . verapamil (CALAN-SR) 240 MG CR tablet Take 240 mg by mouth 2 (two) times daily.    [name] warfarin (COUMADIN) 2 MG tablet Take 1 tablet (2 mg total) by mouth daily. 90 tablet 0   No current facility-administered medications for this visit.    SURGICAL HISTORY:  Past Surgical History:  Procedure Laterality Date  . ABLATION  09/2007   HTA and polyp resection  . BACK SURGERY  03/2004   herniation, L4-L5  . Bil Laprascopic knee surgery    . BONE MARROW BIOPSY     2011  . BONE MARROW BIOPSY  4/14  . CHOLECYSTECTOMY    . FOOT  SURGERY  1998  . KNEE ARTHROSCOPY W/ MENISCAL REPAIR  10/10 ; 3/11  . LAPAROSCOPIC CHOLECYSTECTOMY  1997  . NASAL SINUS SURGERY  2004  . UTERINE FIBROID EMBOLIZATION      REVIEW OF SYSTEMS:   Review of Systems  Constitutional: Positive for fatigue. Negative for appetite change, chills, fever and unexpected weight change.  HENT: Negative for mouth sores, nosebleeds, sore throat and trouble swallowing.   Eyes: Negative for eye problems and icterus.  Respiratory: Negative for cough, hemoptysis, shortness of breath and wheezing.   Cardiovascular: Negative for chest pain and leg swelling.  Gastrointestinal: Positive for mild nausea. Negative for abdominal pain, constipation, diarrhea, and vomiting.  Genitourinary: Negative for bladder incontinence, difficulty urinating, dysuria, frequency and hematuria.   Musculoskeletal: Negative for back pain, gait problem, neck pain and neck stiffness.  Skin: Negative for itching and rash.  Neurological: Negative for dizziness, extremity weakness, gait problem, headaches, light-headedness and seizures.  Hematological: Negative for adenopathy. Does not bruise/bleed easily.  Psychiatric/Behavioral: Negative for confusion, depression and sleep disturbance. The patient is not nervous/anxious.     PHYSICAL EXAMINATION:  There were no vitals taken for this visit.  ECOG PERFORMANCE STATUS: 1 - Symptomatic but completely ambulatory  Physical Exam  Constitutional: Oriented to person, place, and time and well-developed, well-nourished, and in no distress. HENT:  Head: Normocephalic and atraumatic.  Mouth/Throat: Oropharynx is clear and moist. No oropharyngeal exudate.  Eyes: Conjunctivae are normal. Right eye exhibits no discharge. Left eye exhibits no discharge. No scleral icterus.  Neck: Normal range of motion. Neck supple.  Cardiovascular: Normal rate, regular rhythm, normal heart sounds and intact distal pulses.   Pulmonary/Chest: Effort normal and breath  sounds normal. No respiratory distress. No wheezes. No rales.  Abdominal: Soft. Bowel sounds are normal. Exhibits no distension and no mass. There is no tenderness.  Musculoskeletal: Normal range of motion. Exhibits no edema.  Lymphadenopathy:    No cervical adenopathy.  Neurological: Alert and oriented to person, place, and time. Exhibits normal muscle tone. Gait normal. Coordination normal.  Skin: Skin is warm and dry. No rash noted. Not diaphoretic. No erythema. No pallor.  Psychiatric: Mood, memory and judgment normal.  Vitals reviewed.  LABORATORY DATA: Lab Results  Component Value Date   WBC 2.7 (L) 05/20/2019   HGB 9.3 (L) 05/20/2019   HCT 30.9 (L) 05/20/2019   MCV 86.8 05/20/2019   PLT 126 (L) 05/20/2019      Chemistry      Component Value Date/Time   NA 142 05/20/2019 0838   NA 138 11/14/2016 0824   K 3.5 05/20/2019 0838   K 4.3 11/14/2016 0824   CL 106 05/20/2019 0838   CL 104 04/07/2012 1415   CO2 21 (L) 05/20/2019 0838   CO2 23 11/14/2016 0824   BUN 6 05/20/2019 0838   BUN 9.1 11/14/2016 0824   CREATININE 0.77 05/20/2019 0838   CREATININE 0.7 11/14/2016 0824      Component Value Date/Time   CALCIUM 9.0 05/20/2019 0838   CALCIUM 9.6 11/14/2016 0824   ALKPHOS 126 05/20/2019 0838   ALKPHOS 94 11/14/2016 0824   AST 33 05/20/2019 0838   AST 28 11/14/2016 0824   ALT 32 05/20/2019 0838   ALT 24 11/14/2016 0824   BILITOT 0.8 05/20/2019 0838   BILITOT 0.41 11/14/2016 0824       RADIOGRAPHIC STUDIES:  No results found.   ASSESSMENT/PLAN:  This is a very pleasant 59 year old Caucasian female with multiple myeloma status post several chemotherapy regimens.  She had been on treatment with subcutaneous weekly Velcade as well as Revlimid and Decadron.  She is status post 30 cycles.  She had tolerated this well except for fatigue.  She been on observation for 6 months.  She was seen in February 2021 and complained of increased fatigue, weakness, myalgias,  and paresthesias.  She had a repeat myeloma panel which showed evidence of disease progression with increase in the free lambda light chain.  Dr. Julien Nordmann recommend that she resume her treatment with weekly subcutaneous Velcade in addition to Revlimid and Decadron.  She restarted treatment on March 17, 2019.  She is status post 9 more cycles of  Velcade which she tolerated well without any adverse side effects except for fatigue and mild nausea.   Labs were reviewed.  Recommend that she proceed with her 10th weekly dose of Velcade as planned.  She will continue taking her warfarin and acyclovir.  I will arrange for her next myeloma panel to be performed (~6/8) prior to her next follow up visit which is scheduled for 6/15.   We will see her back for follow-up visit in 4 weeks for evaluation and repeat blood work.  She will use her sublingual Zofran as needed for nausea  The patient was advised to call immediately if she has any concerning symptoms in the interval. The patient voices understanding of current disease status and treatment options and is in agreement with the current care plan. All questions were answered. The patient knows to call the clinic with any problems, questions or concerns. We can certainly see the patient much sooner if necessary    No orders of the defined types were placed in this encounter.    Dossie Swor L Adrienna Karis, PA-C 05/24/19

## 2019-05-25 ENCOUNTER — Other Ambulatory Visit: Payer: Self-pay | Admitting: Internal Medicine

## 2019-05-25 DIAGNOSIS — C9000 Multiple myeloma not having achieved remission: Secondary | ICD-10-CM

## 2019-05-26 ENCOUNTER — Other Ambulatory Visit: Payer: Self-pay | Admitting: Medical Oncology

## 2019-05-26 ENCOUNTER — Other Ambulatory Visit: Payer: Self-pay

## 2019-05-26 ENCOUNTER — Inpatient Hospital Stay (HOSPITAL_BASED_OUTPATIENT_CLINIC_OR_DEPARTMENT_OTHER): Payer: BC Managed Care – PPO | Admitting: Physician Assistant

## 2019-05-26 ENCOUNTER — Inpatient Hospital Stay: Payer: BC Managed Care – PPO

## 2019-05-26 VITALS — BP 141/57 | HR 62 | Temp 98.2°F | Resp 16 | Ht 67.0 in | Wt 255.0 lb

## 2019-05-26 DIAGNOSIS — C9000 Multiple myeloma not having achieved remission: Secondary | ICD-10-CM

## 2019-05-26 DIAGNOSIS — Z880 Allergy status to penicillin: Secondary | ICD-10-CM | POA: Diagnosis not present

## 2019-05-26 DIAGNOSIS — Z885 Allergy status to narcotic agent status: Secondary | ICD-10-CM | POA: Diagnosis not present

## 2019-05-26 DIAGNOSIS — Z79899 Other long term (current) drug therapy: Secondary | ICD-10-CM | POA: Diagnosis not present

## 2019-05-26 DIAGNOSIS — Z5112 Encounter for antineoplastic immunotherapy: Secondary | ICD-10-CM | POA: Diagnosis not present

## 2019-05-26 DIAGNOSIS — Z7952 Long term (current) use of systemic steroids: Secondary | ICD-10-CM | POA: Diagnosis not present

## 2019-05-26 DIAGNOSIS — K5900 Constipation, unspecified: Secondary | ICD-10-CM | POA: Diagnosis not present

## 2019-05-26 DIAGNOSIS — Z7901 Long term (current) use of anticoagulants: Secondary | ICD-10-CM | POA: Diagnosis not present

## 2019-05-26 DIAGNOSIS — M791 Myalgia, unspecified site: Secondary | ICD-10-CM | POA: Diagnosis not present

## 2019-05-26 DIAGNOSIS — I10 Essential (primary) hypertension: Secondary | ICD-10-CM | POA: Diagnosis not present

## 2019-05-26 DIAGNOSIS — R197 Diarrhea, unspecified: Secondary | ICD-10-CM | POA: Diagnosis not present

## 2019-05-26 DIAGNOSIS — R531 Weakness: Secondary | ICD-10-CM | POA: Diagnosis not present

## 2019-05-26 DIAGNOSIS — G629 Polyneuropathy, unspecified: Secondary | ICD-10-CM | POA: Diagnosis not present

## 2019-05-26 DIAGNOSIS — Z5111 Encounter for antineoplastic chemotherapy: Secondary | ICD-10-CM

## 2019-05-26 DIAGNOSIS — R110 Nausea: Secondary | ICD-10-CM | POA: Diagnosis not present

## 2019-05-26 DIAGNOSIS — D472 Monoclonal gammopathy: Secondary | ICD-10-CM | POA: Diagnosis not present

## 2019-05-26 DIAGNOSIS — R5383 Other fatigue: Secondary | ICD-10-CM | POA: Diagnosis not present

## 2019-05-26 LAB — CBC WITH DIFFERENTIAL (CANCER CENTER ONLY)
Abs Immature Granulocytes: 0.01 10*3/uL (ref 0.00–0.07)
Basophils Absolute: 0 10*3/uL (ref 0.0–0.1)
Basophils Relative: 1 %
Eosinophils Absolute: 0.2 10*3/uL (ref 0.0–0.5)
Eosinophils Relative: 5 %
HCT: 30.6 % — ABNORMAL LOW (ref 36.0–46.0)
Hemoglobin: 9.3 g/dL — ABNORMAL LOW (ref 12.0–15.0)
Immature Granulocytes: 0 %
Lymphocytes Relative: 17 %
Lymphs Abs: 0.6 10*3/uL — ABNORMAL LOW (ref 0.7–4.0)
MCH: 26.3 pg (ref 26.0–34.0)
MCHC: 30.4 g/dL (ref 30.0–36.0)
MCV: 86.4 fL (ref 80.0–100.0)
Monocytes Absolute: 0.4 10*3/uL (ref 0.1–1.0)
Monocytes Relative: 12 %
Neutro Abs: 2 10*3/uL (ref 1.7–7.7)
Neutrophils Relative %: 65 %
Platelet Count: 116 10*3/uL — ABNORMAL LOW (ref 150–400)
RBC: 3.54 MIL/uL — ABNORMAL LOW (ref 3.87–5.11)
RDW: 16.9 % — ABNORMAL HIGH (ref 11.5–15.5)
WBC Count: 3.2 10*3/uL — ABNORMAL LOW (ref 4.0–10.5)
nRBC: 0 % (ref 0.0–0.2)

## 2019-05-26 LAB — CMP (CANCER CENTER ONLY)
ALT: 36 U/L (ref 0–44)
AST: 39 U/L (ref 15–41)
Albumin: 3.5 g/dL (ref 3.5–5.0)
Alkaline Phosphatase: 134 U/L — ABNORMAL HIGH (ref 38–126)
Anion gap: 11 (ref 5–15)
BUN: 10 mg/dL (ref 6–20)
CO2: 22 mmol/L (ref 22–32)
Calcium: 8.8 mg/dL — ABNORMAL LOW (ref 8.9–10.3)
Chloride: 106 mmol/L (ref 98–111)
Creatinine: 0.77 mg/dL (ref 0.44–1.00)
GFR, Est AFR Am: >60 mL/min (ref >60)
GFR, Estimated: >60 mL/min (ref >60)
Glucose, Bld: 139 mg/dL — ABNORMAL HIGH (ref 70–99)
Potassium: 4 mmol/L (ref 3.5–5.1)
Sodium: 139 mmol/L (ref 135–145)
Total Bilirubin: 0.6 mg/dL (ref 0.3–1.2)
Total Protein: 6.4 g/dL — ABNORMAL LOW (ref 6.5–8.1)

## 2019-05-26 MED ORDER — ONDANSETRON HCL 8 MG PO TABS
ORAL_TABLET | ORAL | Status: AC
  Filled 2019-05-26: qty 1

## 2019-05-26 MED ORDER — BORTEZOMIB CHEMO SQ INJECTION 3.5 MG (2.5MG/ML)
1.3000 mg/m2 | Freq: Once | INTRAMUSCULAR | Status: AC
  Administered 2019-05-26: 3 mg via SUBCUTANEOUS
  Filled 2019-05-26: qty 1.2

## 2019-05-26 MED ORDER — ONDANSETRON HCL 8 MG PO TABS
8.0000 mg | ORAL_TABLET | Freq: Once | ORAL | Status: AC
  Administered 2019-05-26: 8 mg via ORAL

## 2019-05-26 MED ORDER — LENALIDOMIDE 25 MG PO CAPS: ORAL_CAPSULE | ORAL | 0 refills | Status: DC

## 2019-05-26 NOTE — Patient Instructions (Signed)
[facility] Discharge Instructions for Patients Receiving Chemotherapy  Today you received the following chemotherapy agents Velcade.  To help prevent nausea and vomiting after your treatment, we encourage you to take your nausea medication as directed.  If you develop nausea and vomiting that is not controlled by your nausea medication, call the clinic.   BELOW ARE SYMPTOMS THAT SHOULD BE REPORTED IMMEDIATELY:  *FEVER GREATER THAN 100.5 F  *CHILLS WITH OR WITHOUT FEVER  NAUSEA AND VOMITING THAT IS NOT CONTROLLED WITH YOUR NAUSEA MEDICATION  *UNUSUAL SHORTNESS OF BREATH  *UNUSUAL BRUISING OR BLEEDING  TENDERNESS IN MOUTH AND THROAT WITH OR WITHOUT PRESENCE OF ULCERS  *URINARY PROBLEMS  *BOWEL PROBLEMS  UNUSUAL RASH Items with * indicate a potential emergency and should be followed up as soon as possible.  Feel free to call the clinic should you have any questions or concerns. The clinic phone number is [phone].  Please show the CHEMO ALERT CARD at check-in to the Emergency Department and triage nurse.   

## 2019-05-28 ENCOUNTER — Telehealth: Payer: Self-pay | Admitting: Internal Medicine

## 2019-05-28 NOTE — Telephone Encounter (Signed)
Scheduled per Tx plan ended 6/22.  5/18 LOS. Noted to give pt updated calendar.

## 2019-06-02 ENCOUNTER — Other Ambulatory Visit: Payer: Self-pay

## 2019-06-02 ENCOUNTER — Inpatient Hospital Stay: Payer: BC Managed Care – PPO

## 2019-06-02 VITALS — BP 117/42 | HR 55 | Temp 98.7°F | Resp 20 | Wt 256.2 lb

## 2019-06-02 DIAGNOSIS — K5900 Constipation, unspecified: Secondary | ICD-10-CM | POA: Diagnosis not present

## 2019-06-02 DIAGNOSIS — C9000 Multiple myeloma not having achieved remission: Secondary | ICD-10-CM | POA: Diagnosis not present

## 2019-06-02 DIAGNOSIS — Z5112 Encounter for antineoplastic immunotherapy: Secondary | ICD-10-CM | POA: Diagnosis not present

## 2019-06-02 DIAGNOSIS — Z880 Allergy status to penicillin: Secondary | ICD-10-CM | POA: Diagnosis not present

## 2019-06-02 DIAGNOSIS — M791 Myalgia, unspecified site: Secondary | ICD-10-CM | POA: Diagnosis not present

## 2019-06-02 DIAGNOSIS — R531 Weakness: Secondary | ICD-10-CM | POA: Diagnosis not present

## 2019-06-02 DIAGNOSIS — Z7901 Long term (current) use of anticoagulants: Secondary | ICD-10-CM | POA: Diagnosis not present

## 2019-06-02 DIAGNOSIS — R197 Diarrhea, unspecified: Secondary | ICD-10-CM | POA: Diagnosis not present

## 2019-06-02 DIAGNOSIS — I10 Essential (primary) hypertension: Secondary | ICD-10-CM | POA: Diagnosis not present

## 2019-06-02 DIAGNOSIS — Z7952 Long term (current) use of systemic steroids: Secondary | ICD-10-CM | POA: Diagnosis not present

## 2019-06-02 DIAGNOSIS — R5383 Other fatigue: Secondary | ICD-10-CM | POA: Diagnosis not present

## 2019-06-02 DIAGNOSIS — D472 Monoclonal gammopathy: Secondary | ICD-10-CM | POA: Diagnosis not present

## 2019-06-02 DIAGNOSIS — Z885 Allergy status to narcotic agent status: Secondary | ICD-10-CM | POA: Diagnosis not present

## 2019-06-02 DIAGNOSIS — G629 Polyneuropathy, unspecified: Secondary | ICD-10-CM | POA: Diagnosis not present

## 2019-06-02 DIAGNOSIS — R110 Nausea: Secondary | ICD-10-CM | POA: Diagnosis not present

## 2019-06-02 DIAGNOSIS — Z79899 Other long term (current) drug therapy: Secondary | ICD-10-CM | POA: Diagnosis not present

## 2019-06-02 LAB — CMP (CANCER CENTER ONLY)
ALT: 38 U/L (ref 0–44)
AST: 47 U/L — ABNORMAL HIGH (ref 15–41)
Albumin: 3.6 g/dL (ref 3.5–5.0)
Alkaline Phosphatase: 130 U/L — ABNORMAL HIGH (ref 38–126)
Anion gap: 9 (ref 5–15)
BUN: 11 mg/dL (ref 6–20)
CO2: 24 mmol/L (ref 22–32)
Calcium: 8.8 mg/dL — ABNORMAL LOW (ref 8.9–10.3)
Chloride: 106 mmol/L (ref 98–111)
Creatinine: 0.8 mg/dL (ref 0.44–1.00)
GFR, Est AFR Am: >60 mL/min
GFR, Estimated: >60 mL/min
Glucose, Bld: 125 mg/dL — ABNORMAL HIGH (ref 70–99)
Potassium: 3.9 mmol/L (ref 3.5–5.1)
Sodium: 139 mmol/L (ref 135–145)
Total Bilirubin: 0.7 mg/dL (ref 0.3–1.2)
Total Protein: 6.6 g/dL (ref 6.5–8.1)

## 2019-06-02 LAB — CBC WITH DIFFERENTIAL (CANCER CENTER ONLY)
Abs Immature Granulocytes: 0.02 10*3/uL (ref 0.00–0.07)
Basophils Absolute: 0 10*3/uL (ref 0.0–0.1)
Basophils Relative: 1 %
Eosinophils Absolute: 0.2 10*3/uL (ref 0.0–0.5)
Eosinophils Relative: 7 %
HCT: 29.1 % — ABNORMAL LOW (ref 36.0–46.0)
Hemoglobin: 8.9 g/dL — ABNORMAL LOW (ref 12.0–15.0)
Immature Granulocytes: 1 %
Lymphocytes Relative: 21 %
Lymphs Abs: 0.6 10*3/uL — ABNORMAL LOW (ref 0.7–4.0)
MCH: 26.3 pg (ref 26.0–34.0)
MCHC: 30.6 g/dL (ref 30.0–36.0)
MCV: 86.1 fL (ref 80.0–100.0)
Monocytes Absolute: 0.3 10*3/uL (ref 0.1–1.0)
Monocytes Relative: 12 %
Neutro Abs: 1.6 10*3/uL — ABNORMAL LOW (ref 1.7–7.7)
Neutrophils Relative %: 58 %
Platelet Count: 122 10*3/uL — ABNORMAL LOW (ref 150–400)
RBC: 3.38 MIL/uL — ABNORMAL LOW (ref 3.87–5.11)
RDW: 17 % — ABNORMAL HIGH (ref 11.5–15.5)
WBC Count: 2.7 10*3/uL — ABNORMAL LOW (ref 4.0–10.5)
nRBC: 0 % (ref 0.0–0.2)

## 2019-06-02 MED ORDER — ONDANSETRON HCL 8 MG PO TABS
ORAL_TABLET | ORAL | Status: AC
  Filled 2019-06-02: qty 1

## 2019-06-02 MED ORDER — BORTEZOMIB CHEMO SQ INJECTION 3.5 MG (2.5MG/ML)
1.3000 mg/m2 | Freq: Once | INTRAMUSCULAR | Status: AC
  Administered 2019-06-02: 3 mg via SUBCUTANEOUS
  Filled 2019-06-02: qty 1.2

## 2019-06-02 MED ORDER — ONDANSETRON HCL 8 MG PO TABS
8.0000 mg | ORAL_TABLET | Freq: Once | ORAL | Status: AC
  Administered 2019-06-02: 8 mg via ORAL

## 2019-06-02 NOTE — Patient Instructions (Signed)
[facility] Discharge Instructions for Patients Receiving Chemotherapy  Today you received the following chemotherapy agent: Velcade  To help prevent nausea and vomiting after your treatment, we encourage you to take your nausea medication as directed by your MD.   If you develop nausea and vomiting that is not controlled by your nausea medication, call the clinic.   BELOW ARE SYMPTOMS THAT SHOULD BE REPORTED IMMEDIATELY:  *FEVER GREATER THAN 100.5 F  *CHILLS WITH OR WITHOUT FEVER  NAUSEA AND VOMITING THAT IS NOT CONTROLLED WITH YOUR NAUSEA MEDICATION  *UNUSUAL SHORTNESS OF BREATH  *UNUSUAL BRUISING OR BLEEDING  TENDERNESS IN MOUTH AND THROAT WITH OR WITHOUT PRESENCE OF ULCERS  *URINARY PROBLEMS  *BOWEL PROBLEMS  UNUSUAL RASH Items with * indicate a potential emergency and should be followed up as soon as possible.  Feel free to call the clinic should you have any questions or concerns. The clinic phone number is [phone].  Please show the CHEMO ALERT CARD at check-in to the Emergency Department and triage nurse.   

## 2019-06-02 NOTE — Progress Notes (Signed)
Pharmacist Chemotherapy Monitoring - Follow Up Assessment    I verify that I have reviewed each item in the below checklist:  . Regimen for the patient is scheduled for the appropriate day and plan matches scheduled date. [name] Appropriate non-routine labs are ordered dependent on drug ordered. . If applicable, additional medications reviewed and ordered per protocol based on lifetime cumulative doses and/or treatment regimen.   Plan for follow-up and/or issues identified: No . I-vent associated with next due treatment: No . MD and/or nursing notified: No  Letonya Mangels K 06/02/2019 3:40 PM

## 2019-06-09 ENCOUNTER — Inpatient Hospital Stay: Payer: BC Managed Care – PPO

## 2019-06-09 ENCOUNTER — Other Ambulatory Visit: Payer: Self-pay | Admitting: Internal Medicine

## 2019-06-09 ENCOUNTER — Other Ambulatory Visit: Payer: Self-pay

## 2019-06-09 ENCOUNTER — Inpatient Hospital Stay: Payer: BC Managed Care – PPO | Attending: Internal Medicine

## 2019-06-09 VITALS — BP 143/59 | HR 63 | Temp 98.1°F | Resp 20

## 2019-06-09 DIAGNOSIS — Z5112 Encounter for antineoplastic immunotherapy: Secondary | ICD-10-CM | POA: Diagnosis not present

## 2019-06-09 DIAGNOSIS — Z880 Allergy status to penicillin: Secondary | ICD-10-CM | POA: Diagnosis not present

## 2019-06-09 DIAGNOSIS — C9000 Multiple myeloma not having achieved remission: Secondary | ICD-10-CM | POA: Diagnosis not present

## 2019-06-09 DIAGNOSIS — D7589 Other specified diseases of blood and blood-forming organs: Secondary | ICD-10-CM | POA: Diagnosis not present

## 2019-06-09 DIAGNOSIS — E1140 Type 2 diabetes mellitus with diabetic neuropathy, unspecified: Secondary | ICD-10-CM | POA: Insufficient documentation

## 2019-06-09 DIAGNOSIS — Z7901 Long term (current) use of anticoagulants: Secondary | ICD-10-CM | POA: Insufficient documentation

## 2019-06-09 DIAGNOSIS — R5383 Other fatigue: Secondary | ICD-10-CM | POA: Insufficient documentation

## 2019-06-09 DIAGNOSIS — I10 Essential (primary) hypertension: Secondary | ICD-10-CM | POA: Insufficient documentation

## 2019-06-09 DIAGNOSIS — Z7952 Long term (current) use of systemic steroids: Secondary | ICD-10-CM | POA: Insufficient documentation

## 2019-06-09 DIAGNOSIS — D472 Monoclonal gammopathy: Secondary | ICD-10-CM | POA: Diagnosis not present

## 2019-06-09 DIAGNOSIS — Z79899 Other long term (current) drug therapy: Secondary | ICD-10-CM | POA: Insufficient documentation

## 2019-06-09 DIAGNOSIS — Z885 Allergy status to narcotic agent status: Secondary | ICD-10-CM | POA: Insufficient documentation

## 2019-06-09 LAB — CBC WITH DIFFERENTIAL (CANCER CENTER ONLY)
Abs Immature Granulocytes: 0.01 10*3/uL (ref 0.00–0.07)
Basophils Absolute: 0.1 10*3/uL (ref 0.0–0.1)
Basophils Relative: 2 %
Eosinophils Absolute: 0 10*3/uL (ref 0.0–0.5)
Eosinophils Relative: 1 %
HCT: 29.3 % — ABNORMAL LOW (ref 36.0–46.0)
Hemoglobin: 8.9 g/dL — ABNORMAL LOW (ref 12.0–15.0)
Immature Granulocytes: 0 %
Lymphocytes Relative: 19 %
Lymphs Abs: 0.6 10*3/uL — ABNORMAL LOW (ref 0.7–4.0)
MCH: 26.2 pg (ref 26.0–34.0)
MCHC: 30.4 g/dL (ref 30.0–36.0)
MCV: 86.2 fL (ref 80.0–100.0)
Monocytes Absolute: 0.4 10*3/uL (ref 0.1–1.0)
Monocytes Relative: 12 %
Neutro Abs: 2 10*3/uL (ref 1.7–7.7)
Neutrophils Relative %: 66 %
Platelet Count: 108 10*3/uL — ABNORMAL LOW (ref 150–400)
RBC: 3.4 MIL/uL — ABNORMAL LOW (ref 3.87–5.11)
RDW: 17.2 % — ABNORMAL HIGH (ref 11.5–15.5)
WBC Count: 3 10*3/uL — ABNORMAL LOW (ref 4.0–10.5)
nRBC: 0 % (ref 0.0–0.2)

## 2019-06-09 LAB — CMP (CANCER CENTER ONLY)
ALT: 38 U/L (ref 0–44)
AST: 36 U/L (ref 15–41)
Albumin: 3.6 g/dL (ref 3.5–5.0)
Alkaline Phosphatase: 130 U/L — ABNORMAL HIGH (ref 38–126)
Anion gap: 12 (ref 5–15)
BUN: 8 mg/dL (ref 6–20)
CO2: 19 mmol/L — ABNORMAL LOW (ref 22–32)
Calcium: 9 mg/dL (ref 8.9–10.3)
Chloride: 109 mmol/L (ref 98–111)
Creatinine: 0.79 mg/dL (ref 0.44–1.00)
GFR, Est AFR Am: >60 mL/min (ref >60)
GFR, Estimated: >60 mL/min (ref >60)
Glucose, Bld: 123 mg/dL — ABNORMAL HIGH (ref 70–99)
Potassium: 4.2 mmol/L (ref 3.5–5.1)
Sodium: 140 mmol/L (ref 135–145)
Total Bilirubin: 0.6 mg/dL (ref 0.3–1.2)
Total Protein: 6.5 g/dL (ref 6.5–8.1)

## 2019-06-09 MED ORDER — BORTEZOMIB CHEMO SQ INJECTION 3.5 MG (2.5MG/ML)
1.3000 mg/m2 | Freq: Once | INTRAMUSCULAR | Status: AC
  Administered 2019-06-09: 3 mg via SUBCUTANEOUS
  Filled 2019-06-09: qty 1.2

## 2019-06-09 MED ORDER — ONDANSETRON HCL 8 MG PO TABS
8.0000 mg | ORAL_TABLET | Freq: Once | ORAL | Status: AC
  Administered 2019-06-09: 8 mg via ORAL

## 2019-06-09 MED ORDER — ONDANSETRON HCL 8 MG PO TABS
ORAL_TABLET | ORAL | Status: AC
  Filled 2019-06-09: qty 1

## 2019-06-09 NOTE — Patient Instructions (Signed)
[facility] Discharge Instructions for Patients Receiving Chemotherapy  Today you received the following chemotherapy agent: Velcade  To help prevent nausea and vomiting after your treatment, we encourage you to take your nausea medication as directed by your MD.   If you develop nausea and vomiting that is not controlled by your nausea medication, call the clinic.   BELOW ARE SYMPTOMS THAT SHOULD BE REPORTED IMMEDIATELY:  *FEVER GREATER THAN 100.5 F  *CHILLS WITH OR WITHOUT FEVER  NAUSEA AND VOMITING THAT IS NOT CONTROLLED WITH YOUR NAUSEA MEDICATION  *UNUSUAL SHORTNESS OF BREATH  *UNUSUAL BRUISING OR BLEEDING  TENDERNESS IN MOUTH AND THROAT WITH OR WITHOUT PRESENCE OF ULCERS  *URINARY PROBLEMS  *BOWEL PROBLEMS  UNUSUAL RASH Items with * indicate a potential emergency and should be followed up as soon as possible.  Feel free to call the clinic should you have any questions or concerns. The clinic phone number is [phone].  Please show the CHEMO ALERT CARD at check-in to the Emergency Department and triage nurse.   

## 2019-06-12 DIAGNOSIS — E1165 Type 2 diabetes mellitus with hyperglycemia: Secondary | ICD-10-CM | POA: Diagnosis not present

## 2019-06-12 DIAGNOSIS — G609 Hereditary and idiopathic neuropathy, unspecified: Secondary | ICD-10-CM | POA: Diagnosis not present

## 2019-06-12 DIAGNOSIS — E7800 Pure hypercholesterolemia, unspecified: Secondary | ICD-10-CM | POA: Diagnosis not present

## 2019-06-12 DIAGNOSIS — E039 Hypothyroidism, unspecified: Secondary | ICD-10-CM | POA: Diagnosis not present

## 2019-06-16 ENCOUNTER — Other Ambulatory Visit: Payer: Self-pay

## 2019-06-16 ENCOUNTER — Inpatient Hospital Stay: Payer: BC Managed Care – PPO

## 2019-06-16 VITALS — BP 130/60 | HR 66 | Temp 98.0°F | Resp 18 | Wt 250.8 lb

## 2019-06-16 DIAGNOSIS — C9000 Multiple myeloma not having achieved remission: Secondary | ICD-10-CM

## 2019-06-16 DIAGNOSIS — Z880 Allergy status to penicillin: Secondary | ICD-10-CM | POA: Diagnosis not present

## 2019-06-16 DIAGNOSIS — Z7901 Long term (current) use of anticoagulants: Secondary | ICD-10-CM | POA: Diagnosis not present

## 2019-06-16 DIAGNOSIS — Z7952 Long term (current) use of systemic steroids: Secondary | ICD-10-CM | POA: Diagnosis not present

## 2019-06-16 DIAGNOSIS — E1140 Type 2 diabetes mellitus with diabetic neuropathy, unspecified: Secondary | ICD-10-CM | POA: Diagnosis not present

## 2019-06-16 DIAGNOSIS — Z5112 Encounter for antineoplastic immunotherapy: Secondary | ICD-10-CM | POA: Diagnosis not present

## 2019-06-16 DIAGNOSIS — Z79899 Other long term (current) drug therapy: Secondary | ICD-10-CM | POA: Diagnosis not present

## 2019-06-16 DIAGNOSIS — D7589 Other specified diseases of blood and blood-forming organs: Secondary | ICD-10-CM | POA: Diagnosis not present

## 2019-06-16 DIAGNOSIS — Z885 Allergy status to narcotic agent status: Secondary | ICD-10-CM | POA: Diagnosis not present

## 2019-06-16 DIAGNOSIS — I10 Essential (primary) hypertension: Secondary | ICD-10-CM | POA: Diagnosis not present

## 2019-06-16 DIAGNOSIS — R5383 Other fatigue: Secondary | ICD-10-CM | POA: Diagnosis not present

## 2019-06-16 DIAGNOSIS — D472 Monoclonal gammopathy: Secondary | ICD-10-CM | POA: Diagnosis not present

## 2019-06-16 LAB — CBC WITH DIFFERENTIAL (CANCER CENTER ONLY)
Abs Immature Granulocytes: 0.02 10*3/uL (ref 0.00–0.07)
Basophils Absolute: 0.1 10*3/uL (ref 0.0–0.1)
Basophils Relative: 2 %
Eosinophils Absolute: 0.1 10*3/uL (ref 0.0–0.5)
Eosinophils Relative: 3 %
HCT: 31.4 % — ABNORMAL LOW (ref 36.0–46.0)
Hemoglobin: 9.6 g/dL — ABNORMAL LOW (ref 12.0–15.0)
Immature Granulocytes: 1 %
Lymphocytes Relative: 20 %
Lymphs Abs: 0.5 10*3/uL — ABNORMAL LOW (ref 0.7–4.0)
MCH: 26 pg (ref 26.0–34.0)
MCHC: 30.6 g/dL (ref 30.0–36.0)
MCV: 85.1 fL (ref 80.0–100.0)
Monocytes Absolute: 0.2 10*3/uL (ref 0.1–1.0)
Monocytes Relative: 8 %
Neutro Abs: 1.8 10*3/uL (ref 1.7–7.7)
Neutrophils Relative %: 66 %
Platelet Count: 134 10*3/uL — ABNORMAL LOW (ref 150–400)
RBC: 3.69 MIL/uL — ABNORMAL LOW (ref 3.87–5.11)
RDW: 16.8 % — ABNORMAL HIGH (ref 11.5–15.5)
WBC Count: 2.7 10*3/uL — ABNORMAL LOW (ref 4.0–10.5)
nRBC: 0 % (ref 0.0–0.2)

## 2019-06-16 LAB — CMP (CANCER CENTER ONLY)
ALT: 40 U/L (ref 0–44)
AST: 44 U/L — ABNORMAL HIGH (ref 15–41)
Albumin: 4 g/dL (ref 3.5–5.0)
Alkaline Phosphatase: 128 U/L — ABNORMAL HIGH (ref 38–126)
Anion gap: 14 (ref 5–15)
BUN: 10 mg/dL (ref 6–20)
CO2: 20 mmol/L — ABNORMAL LOW (ref 22–32)
Calcium: 9.4 mg/dL (ref 8.9–10.3)
Chloride: 103 mmol/L (ref 98–111)
Creatinine: 0.85 mg/dL (ref 0.44–1.00)
GFR, Est AFR Am: >60 mL/min (ref >60)
GFR, Estimated: >60 mL/min (ref >60)
Glucose, Bld: 137 mg/dL — ABNORMAL HIGH (ref 70–99)
Potassium: 4 mmol/L (ref 3.5–5.1)
Sodium: 137 mmol/L (ref 135–145)
Total Bilirubin: 0.8 mg/dL (ref 0.3–1.2)
Total Protein: 7.1 g/dL (ref 6.5–8.1)

## 2019-06-16 LAB — LACTATE DEHYDROGENASE: LDH: 125 U/L (ref 98–192)

## 2019-06-16 MED ORDER — BORTEZOMIB CHEMO SQ INJECTION 3.5 MG (2.5MG/ML)
1.3000 mg/m2 | Freq: Once | INTRAMUSCULAR | Status: AC
  Administered 2019-06-16: 3 mg via SUBCUTANEOUS
  Filled 2019-06-16: qty 1.2

## 2019-06-16 MED ORDER — ONDANSETRON HCL 8 MG PO TABS
8.0000 mg | ORAL_TABLET | Freq: Once | ORAL | Status: AC
  Administered 2019-06-16: 8 mg via ORAL

## 2019-06-16 MED ORDER — ONDANSETRON HCL 8 MG PO TABS
ORAL_TABLET | ORAL | Status: AC
  Filled 2019-06-16: qty 1

## 2019-06-16 NOTE — Patient Instructions (Signed)
[facility] Discharge Instructions for Patients Receiving Chemotherapy  Today you received the following chemotherapy agents: bortezomib.  To help prevent nausea and vomiting after your treatment, we encourage you to take your nausea medication as directed.   If you develop nausea and vomiting that is not controlled by your nausea medication, call the clinic.   BELOW ARE SYMPTOMS THAT SHOULD BE REPORTED IMMEDIATELY:  *FEVER GREATER THAN 100.5 F  *CHILLS WITH OR WITHOUT FEVER  NAUSEA AND VOMITING THAT IS NOT CONTROLLED WITH YOUR NAUSEA MEDICATION  *UNUSUAL SHORTNESS OF BREATH  *UNUSUAL BRUISING OR BLEEDING  TENDERNESS IN MOUTH AND THROAT WITH OR WITHOUT PRESENCE OF ULCERS  *URINARY PROBLEMS  *BOWEL PROBLEMS  UNUSUAL RASH Items with * indicate a potential emergency and should be followed up as soon as possible.  Feel free to call the clinic should you have any questions or concerns. The clinic phone number is [phone].  Please show the CHEMO ALERT CARD at check-in to the Emergency Department and triage nurse.   

## 2019-06-17 LAB — KAPPA/LAMBDA LIGHT CHAINS
Kappa free light chain: 14.9 mg/L (ref 3.3–19.4)
Kappa, lambda light chain ratio: 0.03 — ABNORMAL LOW (ref 0.26–1.65)
Lambda free light chains: 510.8 mg/L — ABNORMAL HIGH (ref 5.7–26.3)

## 2019-06-18 LAB — BETA 2 MICROGLOBULIN, SERUM: Beta-2 Microglobulin: 1.6 mg/L (ref 0.6–2.4)

## 2019-06-18 LAB — IGG, IGA, IGM
IgA: 115 mg/dL (ref 87–352)
IgG (Immunoglobin G), Serum: 450 mg/dL — ABNORMAL LOW (ref 586–1602)
IgM (Immunoglobulin M), Srm: 482 mg/dL — ABNORMAL HIGH (ref 26–217)

## 2019-06-23 ENCOUNTER — Other Ambulatory Visit: Payer: Self-pay | Admitting: Internal Medicine

## 2019-06-23 ENCOUNTER — Encounter: Payer: Self-pay | Admitting: Internal Medicine

## 2019-06-23 ENCOUNTER — Inpatient Hospital Stay (HOSPITAL_BASED_OUTPATIENT_CLINIC_OR_DEPARTMENT_OTHER): Payer: BC Managed Care – PPO | Admitting: Internal Medicine

## 2019-06-23 ENCOUNTER — Inpatient Hospital Stay: Payer: BC Managed Care – PPO

## 2019-06-23 ENCOUNTER — Other Ambulatory Visit: Payer: Self-pay

## 2019-06-23 VITALS — BP 126/50 | HR 61 | Temp 97.7°F | Resp 18 | Ht 67.0 in | Wt 253.0 lb

## 2019-06-23 DIAGNOSIS — Z880 Allergy status to penicillin: Secondary | ICD-10-CM | POA: Diagnosis not present

## 2019-06-23 DIAGNOSIS — Z5111 Encounter for antineoplastic chemotherapy: Secondary | ICD-10-CM | POA: Diagnosis not present

## 2019-06-23 DIAGNOSIS — Z885 Allergy status to narcotic agent status: Secondary | ICD-10-CM | POA: Diagnosis not present

## 2019-06-23 DIAGNOSIS — C9000 Multiple myeloma not having achieved remission: Secondary | ICD-10-CM

## 2019-06-23 DIAGNOSIS — E1140 Type 2 diabetes mellitus with diabetic neuropathy, unspecified: Secondary | ICD-10-CM | POA: Diagnosis not present

## 2019-06-23 DIAGNOSIS — E8809 Other disorders of plasma-protein metabolism, not elsewhere classified: Secondary | ICD-10-CM

## 2019-06-23 DIAGNOSIS — D7589 Other specified diseases of blood and blood-forming organs: Secondary | ICD-10-CM | POA: Diagnosis not present

## 2019-06-23 DIAGNOSIS — Z7952 Long term (current) use of systemic steroids: Secondary | ICD-10-CM | POA: Diagnosis not present

## 2019-06-23 DIAGNOSIS — I10 Essential (primary) hypertension: Secondary | ICD-10-CM

## 2019-06-23 DIAGNOSIS — R5383 Other fatigue: Secondary | ICD-10-CM | POA: Diagnosis not present

## 2019-06-23 DIAGNOSIS — Z5112 Encounter for antineoplastic immunotherapy: Secondary | ICD-10-CM | POA: Diagnosis not present

## 2019-06-23 DIAGNOSIS — Z7901 Long term (current) use of anticoagulants: Secondary | ICD-10-CM | POA: Diagnosis not present

## 2019-06-23 DIAGNOSIS — D472 Monoclonal gammopathy: Secondary | ICD-10-CM | POA: Diagnosis not present

## 2019-06-23 DIAGNOSIS — Z79899 Other long term (current) drug therapy: Secondary | ICD-10-CM | POA: Diagnosis not present

## 2019-06-23 LAB — CMP (CANCER CENTER ONLY)
ALT: 51 U/L — ABNORMAL HIGH (ref 0–44)
AST: 51 U/L — ABNORMAL HIGH (ref 15–41)
Albumin: 3.6 g/dL (ref 3.5–5.0)
Alkaline Phosphatase: 128 U/L — ABNORMAL HIGH (ref 38–126)
Anion gap: 11 (ref 5–15)
BUN: 7 mg/dL (ref 6–20)
CO2: 20 mmol/L — ABNORMAL LOW (ref 22–32)
Calcium: 9 mg/dL (ref 8.9–10.3)
Chloride: 109 mmol/L (ref 98–111)
Creatinine: 0.77 mg/dL (ref 0.44–1.00)
GFR, Est AFR Am: >60 mL/min (ref >60)
GFR, Estimated: >60 mL/min (ref >60)
Glucose, Bld: 127 mg/dL — ABNORMAL HIGH (ref 70–99)
Potassium: 3.8 mmol/L (ref 3.5–5.1)
Sodium: 140 mmol/L (ref 135–145)
Total Bilirubin: 0.6 mg/dL (ref 0.3–1.2)
Total Protein: 6.6 g/dL (ref 6.5–8.1)

## 2019-06-23 LAB — CBC WITH DIFFERENTIAL (CANCER CENTER ONLY)
Abs Immature Granulocytes: 0.02 10*3/uL (ref 0.00–0.07)
Basophils Absolute: 0 10*3/uL (ref 0.0–0.1)
Basophils Relative: 1 %
Eosinophils Absolute: 0.1 10*3/uL (ref 0.0–0.5)
Eosinophils Relative: 4 %
HCT: 30.3 % — ABNORMAL LOW (ref 36.0–46.0)
Hemoglobin: 9.3 g/dL — ABNORMAL LOW (ref 12.0–15.0)
Immature Granulocytes: 1 %
Lymphocytes Relative: 17 %
Lymphs Abs: 0.6 10*3/uL — ABNORMAL LOW (ref 0.7–4.0)
MCH: 26.3 pg (ref 26.0–34.0)
MCHC: 30.7 g/dL (ref 30.0–36.0)
MCV: 85.8 fL (ref 80.0–100.0)
Monocytes Absolute: 0.4 10*3/uL (ref 0.1–1.0)
Monocytes Relative: 11 %
Neutro Abs: 2.2 10*3/uL (ref 1.7–7.7)
Neutrophils Relative %: 66 %
Platelet Count: 114 10*3/uL — ABNORMAL LOW (ref 150–400)
RBC: 3.53 MIL/uL — ABNORMAL LOW (ref 3.87–5.11)
RDW: 17.2 % — ABNORMAL HIGH (ref 11.5–15.5)
WBC Count: 3.3 10*3/uL — ABNORMAL LOW (ref 4.0–10.5)
nRBC: 0 % (ref 0.0–0.2)

## 2019-06-23 NOTE — Progress Notes (Signed)
[facility] [phone]   [phone]  OFFICE PROGRESS NOTE  [name], MD [place] [address]  DIAGNOSIS: Plasma cell dyscrasia initially diagnosed as MGUS in September 2010, with additional symptoms suggestive of POEMS syndrome.   PRIOR THERAPY:  1) Velcade 1.3 MG/M2 subcutaneously with Decadron 40 mg by mouth on a weekly basis. First cycle 11/24/2013. She status post 31 weekly doses of treatment. 2) Velcade 1.3 MG/M2 subcutaneously and weekly basis with Decadron 40 mg by mouth weekly. First dose 02/01/2015. Status post 28 cycles. 3) Revlimid 25 mg by mouth daily for 21 days every 4 weeks with weekly Decadron 20 mg. started in 11/27/2015. Status post 3 cycles discontinued secondary to lack of response.  CURRENT THERAPY: Systemic treatment with Velcade 1.3 MG/KG weekly, Revlimid 25 mg by mouth daily for 21 days every 4 weeks in addition to Decadron 20 mg by mouth weekly. First dose 03/06/2016. Status post 32 cycles.  Her treatment was on hold for the last few months.  She will resume her treatment March 17, 2019.  INTERVAL HISTORY: Brenda Conner 59 y.o. female returns to the clinic today for follow-up visit.  The patient is feeling fine today with no concerning complaints except for mild fatigue.  She denied having any current chest pain, shortness of breath, cough or hemoptysis.  She denied having any fever or chills.  She has no nausea, vomiting, diarrhea or constipation.  She denied having any headache or visual changes.  She has no recent weight loss or night sweats.  The patient has been tolerating her previous treatment fairly well.  She had repeat myeloma panel performed recently and she is here for evaluation and discussion of her lab results and treatment options.[name]   MEDICAL HISTORY: Past Medical History:  Diagnosis Date  . Acid reflux   . Anxiety   . Asthma   . Cancer [facility])    waldenstroms/  macroglobinulemia  . Depression   . Depression   . Diabetes mellitus without complication ([place])   . Dysuria 02/28/2016  . Hypercholesteremia   . Hypertension   . Hypothyroidism   . Macroglobulinemia ([place])    ? POEMS syndrome  . Multiple myeloma ([place])   . Obesity   . PONV (postoperative nausea and vomiting)   . Sleep apnea    CPAP at bedtime  . Sleep apnea     ALLERGIES:  is allergic to codeine, hydrocodone, lortab [hydrocodone-acetaminophen], onion, shellfish allergy, and amoxicillin.  MEDICATIONS:  Current Outpatient Medications  Medication Sig Dispense Refill  . ACCU-CHEK AVIVA PLUS test strip USE TO CHECK SUGAR TWICE A DAY 90    . acyclovir (ZOVIRAX) 400 MG tablet Take 1 tablet (400 mg total) by mouth 2 (two) times daily. 180 tablet 1  . ALPRAZolam (XANAX) 0.5 MG tablet Take 1 tablet (0.5 mg total) by mouth at bedtime as needed for anxiety. 30 tablet 1  . Azelastine HCl 0.15 % SOLN as needed.    . B-D UF III MINI PEN NEEDLES 31G X 5 MM MISC     . Blood Glucose Monitoring Suppl (ONE TOUCH ULTRA MINI) W/DEVICE KIT See admin instructions. Reported on 01/25/2015  0  . Cetirizine HCl (ZYRTEC ALLERGY PO) Take by mouth daily.    [name] dexamethasone (DECADRON) 4 MG tablet Take 1 tablet (4 mg total) by mouth once a week. Patient takes 5 tablets 1 day week prior to [facility] appointment. [place]  tablet 1  . DiphenhydrAMINE HCl (BENADRYL ALLERGY PO) Take by mouth as needed.    [name] EPIPEN 2-PAK 0.3 MG/0.3ML SOAJ injection Reported on 06/21/2015  1  . Fexofenadine HCl (ALLEGRA ALLERGY PO) Take by mouth daily.    . Fluticasone-Salmeterol (ADVAIR) 100-50 MCG/DOSE AEPB Inhale 2 puffs into the lungs every 12 (twelve) hours.    [name] ibuprofen (ADVIL,MOTRIN) 100 MG tablet Take 100 mg by mouth every 6 (six) hours as needed. Reported on 05/31/2015    . Insulin Glargine (BASAGLAR KWIKPEN) 100 UNIT/ML Inject 20 Units into the skin at bedtime.    [name] lenalidomide (REVLIMID) 25 MG capsule TAKE 1 CAPSULE BY MOUTH ONCE DAILY  21 DAYS ON AND 7 DAYS OFF 21 capsule 0  . levothyroxine (SYNTHROID, LEVOTHROID) 150 MCG tablet Take 150 mcg by mouth daily before breakfast.    . lisinopril (PRINIVIL,ZESTRIL) 10 MG tablet Take 10 mg by mouth daily. auth number 05/29/2018 [id number]  3  . loperamide (IMODIUM) 2 MG capsule Take 2 mg by mouth as needed for diarrhea or loose stools.    . magic mouthwash w/lidocaine SOLN Take 5 mLs by mouth 4 (four) times daily as needed for mouth pain. Swish, Gargle, and spit 240 mL 1  . metFORMIN (GLUCOPHAGE-XR) 500 MG 24 hr tablet Take 500 mg by mouth daily.    [name] NOVOLOG FLEXPEN 100 UNIT/ML FlexPen Inject 15 Units into the skin in the morning, at noon, and at bedtime. Per Sliding Scale    . omeprazole (PRILOSEC) 40 MG capsule Take 40 mg by mouth 2 (two) times daily.     . ondansetron (ZOFRAN-ODT) 8 MG disintegrating tablet Take 1 tablet (8 mg total) by mouth every 8 (eight) hours as needed. Reported on 05/31/2015 30 tablet 2  . pravastatin (PRAVACHOL) 40 MG tablet Take 40 mg by mouth every evening.     [name] PROAIR HFA 108 (90 Base) MCG/ACT inhaler Reported on 06/21/2015  1  . sertraline (ZOLOFT) 50 MG tablet TAKE 3 TABLETS BY MOUTH EVERY DAY 270 tablet 3  . verapamil (CALAN-SR) 240 MG CR tablet Take 240 mg by mouth 2 (two) times daily.    [name] warfarin (COUMADIN) 2 MG tablet Take 1 tablet (2 mg total) by mouth daily. 90 tablet 0   No current facility-administered medications for this visit.    SURGICAL HISTORY:  Past Surgical History:  Procedure Laterality Date  . ABLATION  09/2007   HTA and polyp resection  . BACK SURGERY  03/2004   herniation, L4-L5  . Bil Laprascopic knee surgery    . BONE MARROW BIOPSY     2011  . BONE MARROW BIOPSY  4/14  . CHOLECYSTECTOMY    . FOOT SURGERY  1998  . KNEE ARTHROSCOPY W/ MENISCAL REPAIR  10/10 ; 3/11  . LAPAROSCOPIC CHOLECYSTECTOMY  1997  . NASAL SINUS SURGERY  2004  . UTERINE FIBROID EMBOLIZATION      REVIEW OF SYSTEMS:  Constitutional: positive for  fatigue Eyes: negative Ears, nose, mouth, throat, and face: negative Respiratory: negative Cardiovascular: negative Gastrointestinal: negative Genitourinary:negative Integument/breast: negative Hematologic/lymphatic: negative Musculoskeletal:negative Neurological: negative Behavioral/Psych: negative Endocrine: negative Allergic/Immunologic: negative   PHYSICAL EXAMINATION: General appearance: alert, cooperative, fatigued and no distress Head: Normocephalic, without obvious abnormality, atraumatic Neck: no adenopathy Lymph nodes: Cervical, supraclavicular, and axillary nodes normal. Resp: clear to auscultation bilaterally Back: symmetric, no curvature. ROM normal. No CVA tenderness. Cardio: regular rate and rhythm, S1, S2 normal, no murmur, click, rub or gallop GI: soft, non-tender; bowel  sounds normal; no masses,  no organomegaly Extremities: extremities normal, atraumatic, no cyanosis or edema Neurologic: Alert and oriented X 3, normal strength and tone. Normal symmetric reflexes. Normal coordination and gait  ECOG PERFORMANCE STATUS: 1 - Symptomatic but completely ambulatory  Blood pressure (!) 126/50, pulse 61, temperature 97.7 F (36.5 C), temperature source Temporal, resp. rate 18, height 5' 7" (1.702 m), weight 253 lb (114.8 kg), SpO2 100 %.  LABORATORY DATA: Lab Results  Component Value Date   WBC 3.3 (L) 06/23/2019   HGB 9.3 (L) 06/23/2019   HCT 30.3 (L) 06/23/2019   MCV 85.8 06/23/2019   PLT 114 (L) 06/23/2019      Chemistry      Component Value Date/Time   NA 137 06/16/2019 0834   NA 138 11/14/2016 0824   K 4.0 06/16/2019 0834   K 4.3 11/14/2016 0824   CL 103 06/16/2019 0834   CL 104 04/07/2012 1415   CO2 20 (L) 06/16/2019 0834   CO2 23 11/14/2016 0824   BUN 10 06/16/2019 0834   BUN 9.1 11/14/2016 0824   CREATININE 0.85 06/16/2019 0834   CREATININE 0.7 11/14/2016 0824      Component Value Date/Time   CALCIUM 9.4 06/16/2019 0834   CALCIUM 9.6  11/14/2016 0824   ALKPHOS 128 (H) 06/16/2019 0834   ALKPHOS 94 11/14/2016 0824   AST 44 (H) 06/16/2019 0834   AST 28 11/14/2016 0824   ALT 40 06/16/2019 0834   ALT 24 11/14/2016 0824   BILITOT 0.8 06/16/2019 0834   BILITOT 0.41 11/14/2016 0824      ASSESSMENT AND PLAN:  This is a very pleasant 59 years old white female with smoldering multiple myeloma with questionable POEMS syndrome symptom.  The patient has been on treatment with subcutaneous weekly Velcade as well as Revlimid and Decadron status post 34 cycles.  She has been tolerating this treatment well. She had repeat myeloma panel performed recently.   I discussed the lab results with the patient and it showed improvement of the free lambda light chain. I had a lengthy discussion with the patient today about her current condition and treatment options.  I explained to the patient that we have been chasing her protein study when she has no endorgan disease at this point and I would have to recommend for her observation and close monitoring.  The patient mentioned that she started having aching pain and neuropathy in the lower extremities when she is off treatment but these are manageable at this point.  She does not have any evidence of renal insufficiency or hypercalcemia.  She had some bone lesions seen on the past. She agreed to the observation at this point with close monitoring. I will see her back for follow-up visit in 3 months with repeat myeloma panel and if there is significant increase in her protein study, I may consider her for repeat bone marrow biopsy and aspirate as well as a skeletal bone survey. The patient was advised to call immediately if she has any other concerning symptoms in the interval.  All questions were answered. The patient knows to call the clinic with any problems, questions or concerns. We can certainly see the patient much sooner if necessary.  Disclaimer: This note was dictated with voice recognition  software. Similar sounding words can inadvertently be transcribed and may not be corrected upon review.

## 2019-06-26 ENCOUNTER — Telehealth: Payer: Self-pay | Admitting: Internal Medicine

## 2019-06-26 NOTE — Telephone Encounter (Signed)
Scheduled per los. Called, not able to leave message. Mailed printout  

## 2019-06-30 ENCOUNTER — Other Ambulatory Visit: Payer: BC Managed Care – PPO

## 2019-06-30 ENCOUNTER — Ambulatory Visit: Payer: BC Managed Care – PPO

## 2019-07-09 DIAGNOSIS — D692 Other nonthrombocytopenic purpura: Secondary | ICD-10-CM | POA: Diagnosis not present

## 2019-07-09 DIAGNOSIS — D1801 Hemangioma of skin and subcutaneous tissue: Secondary | ICD-10-CM | POA: Diagnosis not present

## 2019-07-09 DIAGNOSIS — D225 Melanocytic nevi of trunk: Secondary | ICD-10-CM | POA: Diagnosis not present

## 2019-07-09 DIAGNOSIS — L814 Other melanin hyperpigmentation: Secondary | ICD-10-CM | POA: Diagnosis not present

## 2019-09-08 ENCOUNTER — Encounter: Payer: Self-pay | Admitting: Internal Medicine

## 2019-09-15 ENCOUNTER — Other Ambulatory Visit: Payer: Self-pay

## 2019-09-15 ENCOUNTER — Inpatient Hospital Stay: Payer: BC Managed Care – PPO | Attending: Internal Medicine

## 2019-09-15 DIAGNOSIS — I10 Essential (primary) hypertension: Secondary | ICD-10-CM | POA: Insufficient documentation

## 2019-09-15 DIAGNOSIS — C9030 Solitary plasmacytoma not having achieved remission: Secondary | ICD-10-CM | POA: Insufficient documentation

## 2019-09-15 DIAGNOSIS — R5383 Other fatigue: Secondary | ICD-10-CM | POA: Insufficient documentation

## 2019-09-15 DIAGNOSIS — G629 Polyneuropathy, unspecified: Secondary | ICD-10-CM | POA: Diagnosis not present

## 2019-09-15 DIAGNOSIS — D649 Anemia, unspecified: Secondary | ICD-10-CM | POA: Insufficient documentation

## 2019-09-15 DIAGNOSIS — Z885 Allergy status to narcotic agent status: Secondary | ICD-10-CM | POA: Insufficient documentation

## 2019-09-15 DIAGNOSIS — Z79899 Other long term (current) drug therapy: Secondary | ICD-10-CM | POA: Insufficient documentation

## 2019-09-15 DIAGNOSIS — Z9049 Acquired absence of other specified parts of digestive tract: Secondary | ICD-10-CM | POA: Insufficient documentation

## 2019-09-15 DIAGNOSIS — Z880 Allergy status to penicillin: Secondary | ICD-10-CM | POA: Insufficient documentation

## 2019-09-15 DIAGNOSIS — C9000 Multiple myeloma not having achieved remission: Secondary | ICD-10-CM

## 2019-09-15 DIAGNOSIS — Z7901 Long term (current) use of anticoagulants: Secondary | ICD-10-CM | POA: Diagnosis not present

## 2019-09-15 DIAGNOSIS — Z7952 Long term (current) use of systemic steroids: Secondary | ICD-10-CM | POA: Diagnosis not present

## 2019-09-15 LAB — CBC WITH DIFFERENTIAL (CANCER CENTER ONLY)
Abs Immature Granulocytes: 0.02 10*3/uL (ref 0.00–0.07)
Basophils Absolute: 0 10*3/uL (ref 0.0–0.1)
Basophils Relative: 1 %
Eosinophils Absolute: 0.1 10*3/uL (ref 0.0–0.5)
Eosinophils Relative: 2 %
HCT: 31.3 % — ABNORMAL LOW (ref 36.0–46.0)
Hemoglobin: 9.9 g/dL — ABNORMAL LOW (ref 12.0–15.0)
Immature Granulocytes: 1 %
Lymphocytes Relative: 20 %
Lymphs Abs: 0.8 10*3/uL (ref 0.7–4.0)
MCH: 26.2 pg (ref 26.0–34.0)
MCHC: 31.6 g/dL (ref 30.0–36.0)
MCV: 82.8 fL (ref 80.0–100.0)
Monocytes Absolute: 0.3 10*3/uL (ref 0.1–1.0)
Monocytes Relative: 7 %
Neutro Abs: 2.9 10*3/uL (ref 1.7–7.7)
Neutrophils Relative %: 69 %
Platelet Count: 133 10*3/uL — ABNORMAL LOW (ref 150–400)
RBC: 3.78 MIL/uL — ABNORMAL LOW (ref 3.87–5.11)
RDW: 16.1 % — ABNORMAL HIGH (ref 11.5–15.5)
WBC Count: 4.1 10*3/uL (ref 4.0–10.5)
nRBC: 0 % (ref 0.0–0.2)

## 2019-09-15 LAB — CMP (CANCER CENTER ONLY)
ALT: 34 U/L (ref 0–44)
AST: 46 U/L — ABNORMAL HIGH (ref 15–41)
Albumin: 3.9 g/dL (ref 3.5–5.0)
Alkaline Phosphatase: 130 U/L — ABNORMAL HIGH (ref 38–126)
Anion gap: 9 (ref 5–15)
BUN: 10 mg/dL (ref 6–20)
CO2: 21 mmol/L — ABNORMAL LOW (ref 22–32)
Calcium: 9.6 mg/dL (ref 8.9–10.3)
Chloride: 106 mmol/L (ref 98–111)
Creatinine: 0.82 mg/dL (ref 0.44–1.00)
GFR, Est AFR Am: >60 mL/min (ref >60)
GFR, Estimated: >60 mL/min (ref >60)
Glucose, Bld: 157 mg/dL — ABNORMAL HIGH (ref 70–99)
Potassium: 4.2 mmol/L (ref 3.5–5.1)
Sodium: 136 mmol/L (ref 135–145)
Total Bilirubin: 0.5 mg/dL (ref 0.3–1.2)
Total Protein: 7.3 g/dL (ref 6.5–8.1)

## 2019-09-15 LAB — LACTATE DEHYDROGENASE: LDH: 146 U/L (ref 98–192)

## 2019-09-16 LAB — IGG, IGA, IGM
IgA: 124 mg/dL (ref 87–352)
IgG (Immunoglobin G), Serum: 551 mg/dL — ABNORMAL LOW (ref 586–1602)
IgM (Immunoglobulin M), Srm: 525 mg/dL — ABNORMAL HIGH (ref 26–217)

## 2019-09-16 LAB — KAPPA/LAMBDA LIGHT CHAINS
Kappa free light chain: 13.2 mg/L (ref 3.3–19.4)
Kappa, lambda light chain ratio: 0.02 — ABNORMAL LOW (ref 0.26–1.65)
Lambda free light chains: 732.9 mg/L — ABNORMAL HIGH (ref 5.7–26.3)

## 2019-09-17 LAB — BETA 2 MICROGLOBULIN, SERUM: Beta-2 Microglobulin: 1.9 mg/L (ref 0.6–2.4)

## 2019-09-23 ENCOUNTER — Other Ambulatory Visit: Payer: BC Managed Care – PPO

## 2019-09-23 ENCOUNTER — Inpatient Hospital Stay (HOSPITAL_BASED_OUTPATIENT_CLINIC_OR_DEPARTMENT_OTHER): Payer: BC Managed Care – PPO | Admitting: Internal Medicine

## 2019-09-23 ENCOUNTER — Encounter: Payer: Self-pay | Admitting: Internal Medicine

## 2019-09-23 ENCOUNTER — Other Ambulatory Visit: Payer: Self-pay

## 2019-09-23 VITALS — BP 135/68 | HR 80 | Temp 97.3°F | Resp 18 | Ht 67.0 in | Wt 253.0 lb

## 2019-09-23 DIAGNOSIS — I10 Essential (primary) hypertension: Secondary | ICD-10-CM | POA: Diagnosis not present

## 2019-09-23 DIAGNOSIS — D649 Anemia, unspecified: Secondary | ICD-10-CM | POA: Diagnosis not present

## 2019-09-23 DIAGNOSIS — C9000 Multiple myeloma not having achieved remission: Secondary | ICD-10-CM

## 2019-09-23 DIAGNOSIS — Z880 Allergy status to penicillin: Secondary | ICD-10-CM | POA: Diagnosis not present

## 2019-09-23 DIAGNOSIS — C9030 Solitary plasmacytoma not having achieved remission: Secondary | ICD-10-CM | POA: Diagnosis not present

## 2019-09-23 DIAGNOSIS — Z7952 Long term (current) use of systemic steroids: Secondary | ICD-10-CM | POA: Diagnosis not present

## 2019-09-23 DIAGNOSIS — Z79899 Other long term (current) drug therapy: Secondary | ICD-10-CM | POA: Diagnosis not present

## 2019-09-23 DIAGNOSIS — R5383 Other fatigue: Secondary | ICD-10-CM | POA: Diagnosis not present

## 2019-09-23 DIAGNOSIS — Z885 Allergy status to narcotic agent status: Secondary | ICD-10-CM | POA: Diagnosis not present

## 2019-09-23 DIAGNOSIS — Z7901 Long term (current) use of anticoagulants: Secondary | ICD-10-CM | POA: Diagnosis not present

## 2019-09-23 DIAGNOSIS — Z9049 Acquired absence of other specified parts of digestive tract: Secondary | ICD-10-CM | POA: Diagnosis not present

## 2019-09-23 DIAGNOSIS — G629 Polyneuropathy, unspecified: Secondary | ICD-10-CM | POA: Diagnosis not present

## 2019-09-23 NOTE — Progress Notes (Signed)
[facility] [phone]   [phone]  OFFICE PROGRESS NOTE  [name], MD [place] [address]  DIAGNOSIS: Plasma cell dyscrasia initially diagnosed as MGUS in September 2010, with additional symptoms suggestive of POEMS syndrome.   PRIOR THERAPY:  1) Velcade 1.3 MG/M2 subcutaneously with Decadron 40 mg by mouth on a weekly basis. First cycle 11/24/2013. She status post 31 weekly doses of treatment. 2) Velcade 1.3 MG/M2 subcutaneously and weekly basis with Decadron 40 mg by mouth weekly. First dose 02/01/2015. Status post 28 cycles. 3) Revlimid 25 mg by mouth daily for 21 days every 4 weeks with weekly Decadron 20 mg. started in 11/27/2015. Status post 3 cycles discontinued secondary to lack of response.  CURRENT THERAPY: Systemic treatment with Velcade 1.3 MG/KG weekly, Revlimid 25 mg by mouth daily for 21 days every 4 weeks in addition to Decadron 20 mg by mouth weekly. First dose 03/06/2016. Status post 32 cycles.  Her treatment was on hold for the last few months.  She will resume her treatment March 17, 2019.  INTERVAL HISTORY: Brenda Conner 59 y.o. female returns to the clinic today for 3 months follow-up visit.  The patient is feeling fine today with no concerning complaints except for fatigue and mild peripheral neuropathy in the toes.  She denied having any chest pain, shortness of breath, cough or hemoptysis.  She denied having any fever or chills.  She has no nausea, vomiting, diarrhea or constipation.  She has no headache or visual changes.  She is currently on observation and feeling fine.  She had repeat myeloma panel performed recently and she is here for evaluation and discussion of her lab results.    MEDICAL HISTORY: Past Medical History:  Diagnosis Date   Acid reflux    Anxiety    Asthma    Cancer ([place])    waldenstroms/ macroglobinulemia   Depression    Depression    Diabetes mellitus  without complication ([place])    Dysuria 02/28/2016   Hypercholesteremia    Hypertension    Hypothyroidism    Macroglobulinemia ([place])    ? POEMS syndrome   Multiple myeloma ([facility])    Obesity    PONV (postoperative nausea and vomiting)    Sleep apnea    CPAP at bedtime   Sleep apnea     ALLERGIES:  is allergic to codeine, hydrocodone, lortab [hydrocodone-acetaminophen], onion, shellfish allergy, and amoxicillin.  MEDICATIONS:  Current Outpatient Medications  Medication Sig Dispense Refill   ACCU-CHEK AVIVA PLUS test strip USE TO CHECK SUGAR TWICE A DAY 90     acyclovir (ZOVIRAX) 400 MG tablet Take 1 tablet (400 mg total) by mouth 2 (two) times daily. 180 tablet 1   ALPRAZolam (XANAX) 0.5 MG tablet Take 1 tablet (0.5 mg total) by mouth at bedtime as needed for anxiety. 30 tablet 1   Azelastine HCl 0.15 % SOLN as needed.     B-D UF III MINI PEN NEEDLES 31G X 5 MM MISC      Blood Glucose Monitoring Suppl (ONE TOUCH ULTRA MINI) W/DEVICE KIT See admin instructions. Reported on 01/25/2015  0   Cetirizine HCl (ZYRTEC ALLERGY PO) Take by mouth daily.     dexamethasone (DECADRON) 4 MG tablet Take 1 tablet (4 mg total) by mouth once a week. Patient takes 5 tablets 1 day week prior to [facility] appointment. 40 tablet 1   DiphenhydrAMINE HCl (BENADRYL ALLERGY  PO) Take by mouth as needed.     EPIPEN 2-PAK 0.3 MG/0.3ML SOAJ injection Reported on 06/21/2015  1   Fexofenadine HCl (ALLEGRA ALLERGY PO) Take by mouth daily.     Fluticasone-Salmeterol (ADVAIR) 100-50 MCG/DOSE AEPB Inhale 2 puffs into the lungs every 12 (twelve) hours.     ibuprofen (ADVIL,MOTRIN) 100 MG tablet Take 100 mg by mouth every 6 (six) hours as needed. Reported on 05/31/2015     Insulin Glargine (BASAGLAR KWIKPEN) 100 UNIT/ML Inject 20 Units into the skin at bedtime.     lenalidomide (REVLIMID) 25 MG capsule TAKE 1 CAPSULE BY MOUTH ONCE DAILY 21 DAYS ON AND 7 DAYS OFF 21 capsule 0   levothyroxine (SYNTHROID,  LEVOTHROID) 150 MCG tablet Take 150 mcg by mouth daily before breakfast.     lisinopril (PRINIVIL,ZESTRIL) 10 MG tablet Take 10 mg by mouth daily. auth number 05/29/2018 [id number]  3   loperamide (IMODIUM) 2 MG capsule Take 2 mg by mouth as needed for diarrhea or loose stools.     magic mouthwash w/lidocaine SOLN Take 5 mLs by mouth 4 (four) times daily as needed for mouth pain. Swish, Gargle, and spit 240 mL 1   metFORMIN (GLUCOPHAGE-XR) 500 MG 24 hr tablet Take 500 mg by mouth daily.     NOVOLOG FLEXPEN 100 UNIT/ML FlexPen Inject 15 Units into the skin in the morning, at noon, and at bedtime. Per Sliding Scale     omeprazole (PRILOSEC) 40 MG capsule Take 40 mg by mouth 2 (two) times daily.      ondansetron (ZOFRAN-ODT) 8 MG disintegrating tablet Take 1 tablet (8 mg total) by mouth every 8 (eight) hours as needed. Reported on 05/31/2015 30 tablet 2   pravastatin (PRAVACHOL) 40 MG tablet Take 40 mg by mouth every evening.      PROAIR HFA 108 (90 Base) MCG/ACT inhaler Reported on 06/21/2015  1   sertraline (ZOLOFT) 50 MG tablet TAKE 3 TABLETS BY MOUTH EVERY DAY 270 tablet 3   verapamil (CALAN-SR) 240 MG CR tablet Take 240 mg by mouth 2 (two) times daily.     warfarin (COUMADIN) 2 MG tablet Take 1 tablet (2 mg total) by mouth daily. 90 tablet 0   No current facility-administered medications for this visit.    SURGICAL HISTORY:  Past Surgical History:  Procedure Laterality Date   ABLATION  09/2007   HTA and polyp resection   BACK SURGERY  03/2004   herniation, L4-L5   Bil Laprascopic knee surgery     BONE MARROW BIOPSY     2011   BONE MARROW BIOPSY  4/14   CHOLECYSTECTOMY     FOOT SURGERY  1998   KNEE ARTHROSCOPY W/ MENISCAL REPAIR  10/10 ; 3/11   LAPAROSCOPIC CHOLECYSTECTOMY  1997   NASAL SINUS SURGERY  2004   UTERINE FIBROID EMBOLIZATION      REVIEW OF SYSTEMS:  A comprehensive review of systems was negative except for: Constitutional: positive for fatigue    PHYSICAL EXAMINATION: General appearance: alert, cooperative, fatigued and no distress Head: Normocephalic, without obvious abnormality, atraumatic Neck: no adenopathy Lymph nodes: Cervical, supraclavicular, and axillary nodes normal. Resp: clear to auscultation bilaterally Back: symmetric, no curvature. ROM normal. No CVA tenderness. Cardio: regular rate and rhythm, S1, S2 normal, no murmur, click, rub or gallop GI: soft, non-tender; bowel sounds normal; no masses,  no organomegaly Extremities: extremities normal, atraumatic, no cyanosis or edema  ECOG PERFORMANCE STATUS: 1 - Symptomatic but completely ambulatory  Blood pressure  135/68, pulse 80, temperature (!) 97.3 F (36.3 C), temperature source Tympanic, resp. rate 18, height _0  (1.702 m), weight 253 lb (114.8 kg), SpO2 99 %.  LABORATORY DATA: Lab Results  Component Value Date   WBC 4.1 09/15/2019   HGB 9.9 (L) 09/15/2019   HCT 31.3 (L) 09/15/2019   MCV 82.8 09/15/2019   PLT 133 (L) 09/15/2019      Chemistry      Component Value Date/Time   NA 136 09/15/2019 0758   NA 138 11/14/2016 0824   K 4.2 09/15/2019 0758   K 4.3 11/14/2016 0824   CL 106 09/15/2019 0758   CL 104 04/07/2012 1415   CO2 21 (L) 09/15/2019 0758   CO2 23 11/14/2016 0824   BUN 10 09/15/2019 0758   BUN 9.1 11/14/2016 0824   CREATININE 0.82 09/15/2019 0758   CREATININE 0.7 11/14/2016 0824      Component Value Date/Time   CALCIUM 9.6 09/15/2019 0758   CALCIUM 9.6 11/14/2016 0824   ALKPHOS 130 (H) 09/15/2019 0758   ALKPHOS 94 11/14/2016 0824   AST 46 (H) 09/15/2019 0758   AST 28 11/14/2016 0824   ALT 34 09/15/2019 0758   ALT 24 11/14/2016 0824   BILITOT 0.5 09/15/2019 0758   BILITOT 0.41 11/14/2016 0824      ASSESSMENT AND PLAN:  This is a very pleasant 59 years old white female with smoldering multiple myeloma with questionable POEMS syndrome symptom.  The patient has been on treatment with subcutaneous weekly Velcade as well as  Revlimid and Decadron status post 34 cycles.  She has been tolerating this treatment well. The patient is currently on observation and she is feeling fine today with no concerning complaints except for fatigue and peripheral neuropathy. She had repeat myeloma panel performed recently.  I discussed the lab results with the patient and recommended for her to continue on observation for now.  She continues to have mild anemia but her kidney function are within the acceptable range. I will see her back for follow-up visit in 3 months for evaluation with repeat myeloma panel. She was advised to call immediately if she has any concerning symptoms in the interval. All questions were answered. The patient knows to call the clinic with any problems, questions or concerns. We can certainly see the patient much sooner if necessary.  Disclaimer: This note was dictated with voice recognition software. Similar sounding words can inadvertently be transcribed and may not be corrected upon review.

## 2019-09-24 ENCOUNTER — Telehealth: Payer: Self-pay | Admitting: Internal Medicine

## 2019-09-24 NOTE — Telephone Encounter (Signed)
Scheduled per los. Called and left msg. Mailed printout  °

## 2019-09-29 ENCOUNTER — Encounter: Payer: Self-pay | Admitting: Obstetrics & Gynecology

## 2019-09-29 DIAGNOSIS — F419 Anxiety disorder, unspecified: Secondary | ICD-10-CM

## 2019-09-29 NOTE — Telephone Encounter (Signed)
Medication refill request: Alprazolam 0.5mg   Last AEX:  12/19/18 Next AEX: 03/04/20 Last MMG (if hormonal medication request): NA Refill authorized: 30/1

## 2019-09-30 MED ORDER — ALPRAZOLAM 0.5 MG PO TABS: 0.5000 mg | ORAL_TABLET | Freq: Every evening | ORAL | 1 refills | Status: DC | PRN

## 2019-10-05 DIAGNOSIS — J4530 Mild persistent asthma, uncomplicated: Secondary | ICD-10-CM | POA: Diagnosis not present

## 2019-10-05 DIAGNOSIS — J3089 Other allergic rhinitis: Secondary | ICD-10-CM | POA: Diagnosis not present

## 2019-10-05 DIAGNOSIS — J301 Allergic rhinitis due to pollen: Secondary | ICD-10-CM | POA: Diagnosis not present

## 2019-10-05 DIAGNOSIS — J3081 Allergic rhinitis due to animal (cat) (dog) hair and dander: Secondary | ICD-10-CM | POA: Diagnosis not present

## 2019-10-12 DIAGNOSIS — J301 Allergic rhinitis due to pollen: Secondary | ICD-10-CM | POA: Diagnosis not present

## 2019-10-13 DIAGNOSIS — J3089 Other allergic rhinitis: Secondary | ICD-10-CM | POA: Diagnosis not present

## 2019-10-13 DIAGNOSIS — J3081 Allergic rhinitis due to animal (cat) (dog) hair and dander: Secondary | ICD-10-CM | POA: Diagnosis not present

## 2019-10-26 DIAGNOSIS — E119 Type 2 diabetes mellitus without complications: Secondary | ICD-10-CM | POA: Diagnosis not present

## 2019-10-26 DIAGNOSIS — H524 Presbyopia: Secondary | ICD-10-CM | POA: Diagnosis not present

## 2019-10-26 DIAGNOSIS — H52203 Unspecified astigmatism, bilateral: Secondary | ICD-10-CM | POA: Diagnosis not present

## 2019-11-17 DIAGNOSIS — Z20822 Contact with and (suspected) exposure to covid-19: Secondary | ICD-10-CM | POA: Diagnosis not present

## 2019-12-11 DIAGNOSIS — E039 Hypothyroidism, unspecified: Secondary | ICD-10-CM | POA: Diagnosis not present

## 2019-12-11 DIAGNOSIS — E1165 Type 2 diabetes mellitus with hyperglycemia: Secondary | ICD-10-CM | POA: Diagnosis not present

## 2019-12-11 DIAGNOSIS — E7800 Pure hypercholesterolemia, unspecified: Secondary | ICD-10-CM | POA: Diagnosis not present

## 2019-12-11 DIAGNOSIS — G609 Hereditary and idiopathic neuropathy, unspecified: Secondary | ICD-10-CM | POA: Diagnosis not present

## 2019-12-11 DIAGNOSIS — I10 Essential (primary) hypertension: Secondary | ICD-10-CM | POA: Diagnosis not present

## 2019-12-16 ENCOUNTER — Telehealth: Payer: Self-pay | Admitting: Internal Medicine

## 2019-12-16 ENCOUNTER — Inpatient Hospital Stay: Payer: BC Managed Care – PPO

## 2019-12-16 NOTE — Telephone Encounter (Signed)
Rescheduled 12/08 appointment to 12/09 per patient's request.

## 2019-12-17 ENCOUNTER — Other Ambulatory Visit: Payer: Self-pay | Admitting: Internal Medicine

## 2019-12-17 ENCOUNTER — Other Ambulatory Visit: Payer: Self-pay

## 2019-12-17 ENCOUNTER — Inpatient Hospital Stay: Payer: BC Managed Care – PPO | Attending: Internal Medicine

## 2019-12-17 DIAGNOSIS — J069 Acute upper respiratory infection, unspecified: Secondary | ICD-10-CM | POA: Diagnosis not present

## 2019-12-17 DIAGNOSIS — D472 Monoclonal gammopathy: Secondary | ICD-10-CM | POA: Diagnosis not present

## 2019-12-17 DIAGNOSIS — I10 Essential (primary) hypertension: Secondary | ICD-10-CM | POA: Insufficient documentation

## 2019-12-17 DIAGNOSIS — Z7901 Long term (current) use of anticoagulants: Secondary | ICD-10-CM | POA: Diagnosis not present

## 2019-12-17 DIAGNOSIS — R5383 Other fatigue: Secondary | ICD-10-CM | POA: Diagnosis not present

## 2019-12-17 DIAGNOSIS — A084 Viral intestinal infection, unspecified: Secondary | ICD-10-CM | POA: Insufficient documentation

## 2019-12-17 DIAGNOSIS — Z880 Allergy status to penicillin: Secondary | ICD-10-CM | POA: Diagnosis not present

## 2019-12-17 DIAGNOSIS — Z79899 Other long term (current) drug therapy: Secondary | ICD-10-CM | POA: Diagnosis not present

## 2019-12-17 DIAGNOSIS — Z9049 Acquired absence of other specified parts of digestive tract: Secondary | ICD-10-CM | POA: Insufficient documentation

## 2019-12-17 DIAGNOSIS — C9030 Solitary plasmacytoma not having achieved remission: Secondary | ICD-10-CM | POA: Insufficient documentation

## 2019-12-17 DIAGNOSIS — Z885 Allergy status to narcotic agent status: Secondary | ICD-10-CM | POA: Diagnosis not present

## 2019-12-17 DIAGNOSIS — C9000 Multiple myeloma not having achieved remission: Secondary | ICD-10-CM

## 2019-12-17 LAB — CBC WITH DIFFERENTIAL (CANCER CENTER ONLY)
Abs Immature Granulocytes: 0.01 10*3/uL (ref 0.00–0.07)
Basophils Absolute: 0 10*3/uL (ref 0.0–0.1)
Basophils Relative: 0 %
Eosinophils Absolute: 0.1 10*3/uL (ref 0.0–0.5)
Eosinophils Relative: 1 %
HCT: 33.1 % — ABNORMAL LOW (ref 36.0–46.0)
Hemoglobin: 10.6 g/dL — ABNORMAL LOW (ref 12.0–15.0)
Immature Granulocytes: 0 %
Lymphocytes Relative: 13 %
Lymphs Abs: 0.7 10*3/uL (ref 0.7–4.0)
MCH: 26 pg (ref 26.0–34.0)
MCHC: 32 g/dL (ref 30.0–36.0)
MCV: 81.3 fL (ref 80.0–100.0)
Monocytes Absolute: 0.5 10*3/uL (ref 0.1–1.0)
Monocytes Relative: 9 %
Neutro Abs: 3.9 10*3/uL (ref 1.7–7.7)
Neutrophils Relative %: 77 %
Platelet Count: 123 10*3/uL — ABNORMAL LOW (ref 150–400)
RBC: 4.07 MIL/uL (ref 3.87–5.11)
RDW: 15.5 % (ref 11.5–15.5)
WBC Count: 5.1 10*3/uL (ref 4.0–10.5)
nRBC: 0 % (ref 0.0–0.2)

## 2019-12-17 LAB — CMP (CANCER CENTER ONLY)
ALT: 32 U/L (ref 0–44)
AST: 51 U/L — ABNORMAL HIGH (ref 15–41)
Albumin: 3.7 g/dL (ref 3.5–5.0)
Alkaline Phosphatase: 97 U/L (ref 38–126)
Anion gap: 12 (ref 5–15)
BUN: 9 mg/dL (ref 6–20)
CO2: 20 mmol/L — ABNORMAL LOW (ref 22–32)
Calcium: 9.1 mg/dL (ref 8.9–10.3)
Chloride: 100 mmol/L (ref 98–111)
Creatinine: 0.93 mg/dL (ref 0.44–1.00)
GFR, Estimated: >60 mL/min (ref >60)
Glucose, Bld: 208 mg/dL — ABNORMAL HIGH (ref 70–99)
Potassium: 2.8 mmol/L — ABNORMAL LOW (ref 3.5–5.1)
Sodium: 132 mmol/L — ABNORMAL LOW (ref 135–145)
Total Bilirubin: 0.8 mg/dL (ref 0.3–1.2)
Total Protein: 7.5 g/dL (ref 6.5–8.1)

## 2019-12-17 LAB — LACTATE DEHYDROGENASE: LDH: 179 U/L (ref 98–192)

## 2019-12-17 MED ORDER — POTASSIUM CHLORIDE CRYS ER 20 MEQ PO TBCR: 40.0000 meq | EXTENDED_RELEASE_TABLET | Freq: Two times a day (BID) | ORAL | 0 refills | Status: DC

## 2019-12-18 DIAGNOSIS — Z1231 Encounter for screening mammogram for malignant neoplasm of breast: Secondary | ICD-10-CM | POA: Diagnosis not present

## 2019-12-18 LAB — IGG, IGA, IGM
IgA: 132 mg/dL (ref 87–352)
IgG (Immunoglobin G), Serum: 528 mg/dL — ABNORMAL LOW (ref 586–1602)
IgM (Immunoglobulin M), Srm: 526 mg/dL — ABNORMAL HIGH (ref 26–217)

## 2019-12-18 LAB — KAPPA/LAMBDA LIGHT CHAINS
Kappa free light chain: 13.9 mg/L (ref 3.3–19.4)
Kappa, lambda light chain ratio: 0.01 — ABNORMAL LOW (ref 0.26–1.65)
Lambda free light chains: 942.6 mg/L — ABNORMAL HIGH (ref 5.7–26.3)

## 2019-12-18 LAB — BETA 2 MICROGLOBULIN, SERUM: Beta-2 Microglobulin: 2.5 mg/L — ABNORMAL HIGH (ref 0.6–2.4)

## 2019-12-21 ENCOUNTER — Telehealth: Payer: Self-pay

## 2019-12-21 NOTE — Telephone Encounter (Signed)
I spoke with the pt and advised as indicated. She expressed understanding of this information.

## 2019-12-21 NOTE — Telephone Encounter (Signed)
-----   Message from Ardeen Garland, RN sent at 12/17/2019  1:38 PM EST ----- Sorry if this is duplicate ----- Message ----- From: Curt Bears, MD Sent: 12/17/2019   1:24 PM EST To: Ardeen Garland, RN  Her potassium is very low.  I will send prescription for potassium chloride to her pharmacy 40 mEq p.o. daily for 7 days.  Please let her know. ----- Message ----- From: Buel Ream, Lab In [place] Sent: 12/17/2019  11:33 AM EST To: Curt Bears, MD

## 2019-12-23 ENCOUNTER — Telehealth: Payer: Self-pay | Admitting: Internal Medicine

## 2019-12-23 ENCOUNTER — Other Ambulatory Visit: Payer: Self-pay

## 2019-12-23 ENCOUNTER — Encounter: Payer: Self-pay | Admitting: Internal Medicine

## 2019-12-23 ENCOUNTER — Inpatient Hospital Stay: Payer: BC Managed Care – PPO

## 2019-12-23 ENCOUNTER — Inpatient Hospital Stay (HOSPITAL_BASED_OUTPATIENT_CLINIC_OR_DEPARTMENT_OTHER): Payer: BC Managed Care – PPO | Admitting: Internal Medicine

## 2019-12-23 VITALS — BP 138/66 | HR 85 | Temp 97.8°F | Resp 18 | Ht 67.0 in | Wt 257.2 lb

## 2019-12-23 DIAGNOSIS — C9000 Multiple myeloma not having achieved remission: Secondary | ICD-10-CM

## 2019-12-23 DIAGNOSIS — Z9049 Acquired absence of other specified parts of digestive tract: Secondary | ICD-10-CM | POA: Diagnosis not present

## 2019-12-23 DIAGNOSIS — Z79899 Other long term (current) drug therapy: Secondary | ICD-10-CM | POA: Diagnosis not present

## 2019-12-23 DIAGNOSIS — R5383 Other fatigue: Secondary | ICD-10-CM | POA: Diagnosis not present

## 2019-12-23 DIAGNOSIS — Z885 Allergy status to narcotic agent status: Secondary | ICD-10-CM | POA: Diagnosis not present

## 2019-12-23 DIAGNOSIS — J069 Acute upper respiratory infection, unspecified: Secondary | ICD-10-CM | POA: Diagnosis not present

## 2019-12-23 DIAGNOSIS — D472 Monoclonal gammopathy: Secondary | ICD-10-CM

## 2019-12-23 DIAGNOSIS — Z7901 Long term (current) use of anticoagulants: Secondary | ICD-10-CM | POA: Diagnosis not present

## 2019-12-23 DIAGNOSIS — I10 Essential (primary) hypertension: Secondary | ICD-10-CM | POA: Diagnosis not present

## 2019-12-23 DIAGNOSIS — Z880 Allergy status to penicillin: Secondary | ICD-10-CM | POA: Diagnosis not present

## 2019-12-23 DIAGNOSIS — A084 Viral intestinal infection, unspecified: Secondary | ICD-10-CM | POA: Diagnosis not present

## 2019-12-23 DIAGNOSIS — C9030 Solitary plasmacytoma not having achieved remission: Secondary | ICD-10-CM | POA: Diagnosis not present

## 2019-12-23 LAB — BASIC METABOLIC PANEL - CANCER CENTER ONLY
Anion gap: 10 (ref 5–15)
BUN: 13 mg/dL (ref 6–20)
CO2: 21 mmol/L — ABNORMAL LOW (ref 22–32)
Calcium: 9.3 mg/dL (ref 8.9–10.3)
Chloride: 114 mmol/L — ABNORMAL HIGH (ref 98–111)
Creatinine: 0.95 mg/dL (ref 0.44–1.00)
GFR, Estimated: >60 mL/min (ref >60)
Glucose, Bld: 150 mg/dL — ABNORMAL HIGH (ref 70–99)
Potassium: 3.7 mmol/L (ref 3.5–5.1)
Sodium: 145 mmol/L (ref 135–145)

## 2019-12-23 NOTE — Telephone Encounter (Signed)
Scheduled appointment per 12/15. Spoke to patient who is aware of appointment date and time.

## 2019-12-23 NOTE — Progress Notes (Signed)
[place] [phone]   [phone]  OFFICE PROGRESS NOTE  [name], MD [place] [address]  DIAGNOSIS: Plasma cell dyscrasia initially diagnosed as MGUS in September 2010, with additional symptoms suggestive of POEMS syndrome.   PRIOR THERAPY:  1) Velcade 1.3 MG/M2 subcutaneously with Decadron 40 mg by mouth on a weekly basis. First cycle 11/24/2013. She status post 31 weekly doses of treatment. 2) Velcade 1.3 MG/M2 subcutaneously and weekly basis with Decadron 40 mg by mouth weekly. First dose 02/01/2015. Status post 28 cycles. 3) Revlimid 25 mg by mouth daily for 21 days every 4 weeks with weekly Decadron 20 mg. started in 11/27/2015. Status post 3 cycles discontinued secondary to lack of response.  CURRENT THERAPY: Systemic treatment with Velcade 1.3 MG/KG weekly, Revlimid 25 mg by mouth daily for 21 days every 4 weeks in addition to Decadron 20 mg by mouth weekly. First dose 03/06/2016. Status post 32 cycles.  Her treatment was on hold for the last few months.    INTERVAL HISTORY: Brenda Conner 59 y.o. female returns to the clinic today for follow-up visit.  The patient is feeling fine today with no concerning complaints.  She had prolonged upper respiratory infection after Halloween that stayed for almost 6 weeks.  The patient also has recent episode of viral gastroenteritis.  She is feeling much better today she denied having any current chest pain, shortness of breath, cough or hemoptysis.  She denied having any fever or chills.  She has no nausea, vomiting, diarrhea or constipation.  She has no headache or visual changes.  She is here today for evaluation with repeat myeloma panel.  MEDICAL HISTORY: Past Medical History:  Diagnosis Date  . Acid reflux   . Anxiety   . Asthma   . Cancer [facility])    waldenstroms/ macroglobinulemia  . Depression   . Depression   . Diabetes mellitus without complication  ([place])   . Dysuria 02/28/2016  . Hypercholesteremia   . Hypertension   . Hypothyroidism   . Macroglobulinemia ([place])    ? POEMS syndrome  . Multiple myeloma ([place])   . Obesity   . PONV (postoperative nausea and vomiting)   . Sleep apnea    CPAP at bedtime  . Sleep apnea     ALLERGIES:  is allergic to codeine, hydrocodone, lortab [hydrocodone-acetaminophen], onion, shellfish allergy, and amoxicillin.  MEDICATIONS:  Current Outpatient Medications  Medication Sig Dispense Refill  . ACCU-CHEK AVIVA PLUS test strip USE TO CHECK SUGAR TWICE A DAY 90    . acyclovir (ZOVIRAX) 400 MG tablet Take 1 tablet (400 mg total) by mouth 2 (two) times daily. 180 tablet 1  . ALPRAZolam (XANAX) 0.5 MG tablet Take 1 tablet (0.5 mg total) by mouth at bedtime as needed for anxiety. 30 tablet 1  . Azelastine HCl 0.15 % SOLN as needed.    . B-D UF III MINI PEN NEEDLES 31G X 5 MM MISC     . Blood Glucose Monitoring Suppl (ONE TOUCH ULTRA MINI) W/DEVICE KIT See admin instructions. Reported on 01/25/2015  0  . Cetirizine HCl (ZYRTEC ALLERGY PO) Take by mouth daily.    [name] dexamethasone (DECADRON) 4 MG tablet Take 1 tablet (4 mg total) by mouth once a week. Patient takes 5 tablets 1 day week prior to [facility] appointment. 40 tablet 1  . DiphenhydrAMINE HCl (BENADRYL ALLERGY PO) Take by mouth as needed.    [name]  EPIPEN 2-PAK 0.3 MG/0.3ML SOAJ injection Reported on 06/21/2015  1  . Fexofenadine HCl (ALLEGRA ALLERGY PO) Take by mouth daily.    . Fluticasone-Salmeterol (ADVAIR) 100-50 MCG/DOSE AEPB Inhale 2 puffs into the lungs every 12 (twelve) hours.    [name] ibuprofen (ADVIL,MOTRIN) 100 MG tablet Take 100 mg by mouth every 6 (six) hours as needed. Reported on 05/31/2015    . Insulin Glargine (BASAGLAR KWIKPEN) 100 UNIT/ML Inject 20 Units into the skin at bedtime.    [name] lenalidomide (REVLIMID) 25 MG capsule TAKE 1 CAPSULE BY MOUTH ONCE DAILY 21 DAYS ON AND 7 DAYS OFF 21 capsule 0  . levothyroxine (SYNTHROID, LEVOTHROID) 150 MCG  tablet Take 150 mcg by mouth daily before breakfast.    . lisinopril (PRINIVIL,ZESTRIL) 10 MG tablet Take 10 mg by mouth daily. auth number 05/29/2018 [id number]  3  . loperamide (IMODIUM) 2 MG capsule Take 2 mg by mouth as needed for diarrhea or loose stools.    . magic mouthwash w/lidocaine SOLN Take 5 mLs by mouth 4 (four) times daily as needed for mouth pain. Swish, Gargle, and spit 240 mL 1  . metFORMIN (GLUCOPHAGE-XR) 500 MG 24 hr tablet Take 500 mg by mouth daily.    [name] NOVOLOG FLEXPEN 100 UNIT/ML FlexPen Inject 15 Units into the skin in the morning, at noon, and at bedtime. Per Sliding Scale    . omeprazole (PRILOSEC) 40 MG capsule Take 40 mg by mouth 2 (two) times daily.     . ondansetron (ZOFRAN-ODT) 8 MG disintegrating tablet Take 1 tablet (8 mg total) by mouth every 8 (eight) hours as needed. Reported on 05/31/2015 30 tablet 2  . potassium chloride SA (KLOR-CON) 20 MEQ tablet Take 2 tablets (40 mEq total) by mouth 2 (two) times daily. 14 tablet 0  . pravastatin (PRAVACHOL) 40 MG tablet Take 40 mg by mouth every evening.     [name] PROAIR HFA 108 (90 Base) MCG/ACT inhaler Reported on 06/21/2015  1  . sertraline (ZOLOFT) 50 MG tablet TAKE 3 TABLETS BY MOUTH EVERY DAY 270 tablet 3  . verapamil (CALAN-SR) 240 MG CR tablet Take 240 mg by mouth 2 (two) times daily.    [name] warfarin (COUMADIN) 2 MG tablet Take 1 tablet (2 mg total) by mouth daily. 90 tablet 0   No current facility-administered medications for this visit.    SURGICAL HISTORY:  Past Surgical History:  Procedure Laterality Date  . ABLATION  09/2007   HTA and polyp resection  . BACK SURGERY  03/2004   herniation, L4-L5  . Bil Laprascopic knee surgery    . BONE MARROW BIOPSY     2011  . BONE MARROW BIOPSY  4/14  . CHOLECYSTECTOMY    . FOOT SURGERY  1998  . KNEE ARTHROSCOPY W/ MENISCAL REPAIR  10/10 ; 3/11  . LAPAROSCOPIC CHOLECYSTECTOMY  1997  . NASAL SINUS SURGERY  2004  . UTERINE FIBROID EMBOLIZATION      REVIEW OF SYSTEMS:  A  comprehensive review of systems was negative except for: Constitutional: positive for fatigue   PHYSICAL EXAMINATION: General appearance: alert, cooperative, fatigued and no distress Head: Normocephalic, without obvious abnormality, atraumatic Neck: no adenopathy Lymph nodes: Cervical, supraclavicular, and axillary nodes normal. Resp: clear to auscultation bilaterally Back: symmetric, no curvature. ROM normal. No CVA tenderness. Cardio: regular rate and rhythm, S1, S2 normal, no murmur, click, rub or gallop GI: soft, non-tender; bowel sounds normal; no masses,  no organomegaly Extremities: extremities normal, atraumatic, no cyanosis or  edema  ECOG PERFORMANCE STATUS: 1 - Symptomatic but completely ambulatory  Blood pressure 138/66, pulse 85, temperature 97.8 F (36.6 C), temperature source Tympanic, resp. rate 18, height 5' 7"  (1.702 m), weight 257 lb 3.2 oz (116.7 kg), SpO2 100 %.  LABORATORY DATA: Lab Results  Component Value Date   WBC 5.1 12/17/2019   HGB 10.6 (L) 12/17/2019   HCT 33.1 (L) 12/17/2019   MCV 81.3 12/17/2019   PLT 123 (L) 12/17/2019      Chemistry      Component Value Date/Time   NA 132 (L) 12/17/2019 1115   NA 138 11/14/2016 0824   K 2.8 (L) 12/17/2019 1115   K 4.3 11/14/2016 0824   CL 100 12/17/2019 1115   CL 104 04/07/2012 1415   CO2 20 (L) 12/17/2019 1115   CO2 23 11/14/2016 0824   BUN 9 12/17/2019 1115   BUN 9.1 11/14/2016 0824   CREATININE 0.93 12/17/2019 1115   CREATININE 0.7 11/14/2016 0824      Component Value Date/Time   CALCIUM 9.1 12/17/2019 1115   CALCIUM 9.6 11/14/2016 0824   ALKPHOS 97 12/17/2019 1115   ALKPHOS 94 11/14/2016 0824   AST 51 (H) 12/17/2019 1115   AST 28 11/14/2016 0824   ALT 32 12/17/2019 1115   ALT 24 11/14/2016 0824   BILITOT 0.8 12/17/2019 1115   BILITOT 0.41 11/14/2016 0824      ASSESSMENT AND PLAN:  This is a very pleasant 59 years old white female with smoldering multiple myeloma with questionable POEMS  syndrome symptom.  The patient has been on treatment with subcutaneous weekly Velcade as well as Revlimid and Decadron status post 34 cycles.  She has been tolerating this treatment well. The patient has been on observation for the last several months and she has been doing fine with no concerning issues except for the recent upper respiratory infection and viral gastroenteritis. Her myeloma panel showed continuous increase in her free lambda light chain. I recommended for the patient to have repeat bone marrow biopsy and aspirate as well as a skeletal bone survey for reevaluation of her condition and consideration of resuming her treatment if she has evidence for progressive disease. For the hypokalemia, she was given prescription for potassium chloride but has not started it yet.  She mentions that her potassium was low because of the viral gastroenteritis.  We will repeat her potassium level today and if it still low she will start the potassium chloride tablets. The patient will come back for follow-up visit in 4 weeks after her Christmas holiday for evaluation and more detailed discussion of her treatment options based on the biopsy and skeletal bone survey. She was advised to call immediately if she has any concerning symptoms in the interval. All questions were answered. The patient knows to call the clinic with any problems, questions or concerns. We can certainly see the patient much sooner if necessary.  Disclaimer: This note was dictated with voice recognition software. Similar sounding words can inadvertently be transcribed and may not be corrected upon review.

## 2020-01-09 HISTORY — PX: BONE MARROW BIOPSY: SHX199

## 2020-01-12 ENCOUNTER — Other Ambulatory Visit: Payer: Self-pay

## 2020-01-12 ENCOUNTER — Ambulatory Visit (HOSPITAL_COMMUNITY)
Admission: RE | Admit: 2020-01-12 | Discharge: 2020-01-12 | Disposition: A | Discharge status: Home / Self Care | Payer: 59 | Source: Ambulatory Visit | Attending: Internal Medicine | Admitting: Internal Medicine

## 2020-01-12 DIAGNOSIS — C9000 Multiple myeloma not having achieved remission: Secondary | ICD-10-CM | POA: Diagnosis not present

## 2020-01-13 ENCOUNTER — Other Ambulatory Visit: Payer: Self-pay | Admitting: Radiology

## 2020-01-13 ENCOUNTER — Encounter: Payer: Self-pay | Admitting: Internal Medicine

## 2020-01-14 ENCOUNTER — Other Ambulatory Visit: Payer: Self-pay | Admitting: Radiology

## 2020-01-15 ENCOUNTER — Encounter (HOSPITAL_COMMUNITY): Payer: Self-pay

## 2020-01-15 ENCOUNTER — Ambulatory Visit (HOSPITAL_COMMUNITY)
Admission: RE | Admit: 2020-01-15 | Discharge: 2020-01-15 | Disposition: A | Discharge status: Home / Self Care | Payer: 59 | Source: Ambulatory Visit

## 2020-01-15 ENCOUNTER — Ambulatory Visit (HOSPITAL_COMMUNITY)
Admission: RE | Admit: 2020-01-15 | Discharge: 2020-01-15 | Disposition: A | Discharge status: Home / Self Care | Payer: 59 | Source: Ambulatory Visit | Attending: Internal Medicine | Admitting: Internal Medicine

## 2020-01-15 ENCOUNTER — Other Ambulatory Visit: Payer: Self-pay

## 2020-01-15 ENCOUNTER — Other Ambulatory Visit: Payer: Self-pay | Admitting: Radiology

## 2020-01-15 DIAGNOSIS — Z7984 Long term (current) use of oral hypoglycemic drugs: Secondary | ICD-10-CM | POA: Diagnosis not present

## 2020-01-15 DIAGNOSIS — Z7901 Long term (current) use of anticoagulants: Secondary | ICD-10-CM | POA: Insufficient documentation

## 2020-01-15 DIAGNOSIS — Z794 Long term (current) use of insulin: Secondary | ICD-10-CM | POA: Insufficient documentation

## 2020-01-15 DIAGNOSIS — Z7951 Long term (current) use of inhaled steroids: Secondary | ICD-10-CM | POA: Insufficient documentation

## 2020-01-15 DIAGNOSIS — Z79890 Hormone replacement therapy (postmenopausal): Secondary | ICD-10-CM | POA: Diagnosis not present

## 2020-01-15 DIAGNOSIS — Z9049 Acquired absence of other specified parts of digestive tract: Secondary | ICD-10-CM | POA: Diagnosis not present

## 2020-01-15 DIAGNOSIS — D649 Anemia, unspecified: Secondary | ICD-10-CM | POA: Insufficient documentation

## 2020-01-15 DIAGNOSIS — Z79899 Other long term (current) drug therapy: Secondary | ICD-10-CM | POA: Insufficient documentation

## 2020-01-15 DIAGNOSIS — C9000 Multiple myeloma not having achieved remission: Secondary | ICD-10-CM | POA: Diagnosis present

## 2020-01-15 LAB — CBC WITH DIFFERENTIAL/PLATELET
Abs Immature Granulocytes: 0.01 10*3/uL (ref 0.00–0.07)
Basophils Absolute: 0 10*3/uL (ref 0.0–0.1)
Basophils Relative: 1 %
Eosinophils Absolute: 0.1 10*3/uL (ref 0.0–0.5)
Eosinophils Relative: 1 %
HCT: 33.3 % — ABNORMAL LOW (ref 36.0–46.0)
Hemoglobin: 10.2 g/dL — ABNORMAL LOW (ref 12.0–15.0)
Immature Granulocytes: 0 %
Lymphocytes Relative: 17 %
Lymphs Abs: 0.9 10*3/uL (ref 0.7–4.0)
MCH: 26.4 pg (ref 26.0–34.0)
MCHC: 30.6 g/dL (ref 30.0–36.0)
MCV: 86 fL (ref 80.0–100.0)
Monocytes Absolute: 0.4 10*3/uL (ref 0.1–1.0)
Monocytes Relative: 8 %
Neutro Abs: 3.8 10*3/uL (ref 1.7–7.7)
Neutrophils Relative %: 73 %
Platelets: 166 10*3/uL (ref 150–400)
RBC: 3.87 MIL/uL (ref 3.87–5.11)
RDW: 16.1 % — ABNORMAL HIGH (ref 11.5–15.5)
WBC: 5.1 10*3/uL (ref 4.0–10.5)
nRBC: 0 % (ref 0.0–0.2)

## 2020-01-15 LAB — PROTIME-INR
INR: 0.9 (ref 0.8–1.2)
Prothrombin Time: 11.5 seconds (ref 11.4–15.2)

## 2020-01-15 LAB — BASIC METABOLIC PANEL
Anion gap: 13 (ref 5–15)
BUN: 11 mg/dL (ref 6–20)
CO2: 24 mmol/L (ref 22–32)
Calcium: 9.8 mg/dL (ref 8.9–10.3)
Chloride: 101 mmol/L (ref 98–111)
Creatinine, Ser: 0.91 mg/dL (ref 0.44–1.00)
GFR, Estimated: >60 mL/min (ref >60)
Glucose, Bld: 134 mg/dL — ABNORMAL HIGH (ref 70–99)
Potassium: 4.3 mmol/L (ref 3.5–5.1)
Sodium: 138 mmol/L (ref 135–145)

## 2020-01-15 LAB — GLUCOSE, CAPILLARY: Glucose-Capillary: 138 mg/dL — ABNORMAL HIGH (ref 70–99)

## 2020-01-15 MED ORDER — FENTANYL CITRATE (PF) 100 MCG/2ML IJ SOLN
INTRAMUSCULAR | Status: AC | PRN
  Administered 2020-01-15 (×2): 50 ug via INTRAVENOUS

## 2020-01-15 MED ORDER — MIDAZOLAM HCL 2 MG/2ML IJ SOLN
INTRAMUSCULAR | Status: AC
  Filled 2020-01-15: qty 4

## 2020-01-15 MED ORDER — FENTANYL CITRATE (PF) 100 MCG/2ML IJ SOLN
INTRAMUSCULAR | Status: AC
  Filled 2020-01-15: qty 2

## 2020-01-15 MED ORDER — MIDAZOLAM HCL 2 MG/2ML IJ SOLN
INTRAMUSCULAR | Status: AC | PRN
Start: 2020-01-15 — End: 2020-01-15
  Administered 2020-01-15 (×3): 1 mg via INTRAVENOUS

## 2020-01-15 MED ORDER — SODIUM CHLORIDE 0.9 % IV SOLN: INTRAVENOUS | Status: DC

## 2020-01-15 MED ORDER — LIDOCAINE HCL (PF) 1 % IJ SOLN
INTRAMUSCULAR | Status: AC | PRN
  Administered 2020-01-15: 10 mL
  Administered 2020-01-15: 5 mL

## 2020-01-15 NOTE — H&P (Signed)
Referring Physician(s): Mohamed,Mohamed  Supervising Physician: Mir,Farhaan  Patient Status:  [facility] OP  Chief Complaint: "I'm having a bone marrow biopsy"   Subjective: Patient familiar to IR service from bone marrow biopsies in 2010 and 2014.  She has a history of multiple myeloma with questionable POEMS syndrome symptoms and prior treatment, currently under observation.  Repeat myeloma panel performed recently revealed elevated free lambda light chains.  She presents today for repeat CT-guided bone marrow biopsy for further evaluation. She denies fever, headache, chest pain, dyspnea, cough, abdominal/back pain, nausea, vomiting or bleeding.  Additional medical history as below.  Past Medical History:  Diagnosis Date  . Acid reflux   . Anxiety   . Asthma   . Cancer [facility])    waldenstroms/ macroglobinulemia  . Depression   . Depression   . Diabetes mellitus without complication ([place])   . Dysuria 02/28/2016  . Hypercholesteremia   . Hypertension   . Hypothyroidism   . Macroglobulinemia ([place])    ? POEMS syndrome  . Multiple myeloma ([place])   . Obesity   . PONV (postoperative nausea and vomiting)   . Sleep apnea    CPAP at bedtime  . Sleep apnea    Past Surgical History:  Procedure Laterality Date  . ABLATION  09/2007   HTA and polyp resection  . BACK SURGERY  03/2004   herniation, L4-L5  . Bil Laprascopic knee surgery    . BONE MARROW BIOPSY     2011  . BONE MARROW BIOPSY  4/14  . CHOLECYSTECTOMY    . FOOT SURGERY  1998  . KNEE ARTHROSCOPY W/ MENISCAL REPAIR  10/10 ; 3/11  . LAPAROSCOPIC CHOLECYSTECTOMY  1997  . NASAL SINUS SURGERY  2004  . UTERINE FIBROID EMBOLIZATION        Allergies: Codeine, Hydrocodone, Lortab [hydrocodone-acetaminophen], Onion, Shellfish allergy, and Amoxicillin  Medications: Prior to Admission medications   Medication Sig Start Date End Date Taking? Authorizing Provider  ALPRAZolam Duanne Moron) 0.5 MG tablet Take 1 tablet (0.5 mg total) by  mouth at bedtime as needed for anxiety. 09/30/19  Yes Megan Salon, MD  Azelastine HCl 0.15 % SOLN as needed. 12/18/13  Yes [provider]  B-D UF III MINI PEN NEEDLES 31G X 5 MM MISC  02/17/19  Yes [provider]  Blood Glucose Monitoring Suppl (ONE TOUCH ULTRA MINI) W/DEVICE KIT See admin instructions. Reported on 01/25/2015 02/04/14  Yes [provider]  Cetirizine HCl (ZYRTEC ALLERGY PO) Take by mouth daily.   Yes [provider]  DiphenhydrAMINE HCl (BENADRYL ALLERGY PO) Take by mouth as needed.   Yes [provider]  Fexofenadine HCl (ALLEGRA ALLERGY PO) Take by mouth daily.   Yes [provider]  Fluticasone-Salmeterol (ADVAIR) 100-50 MCG/DOSE AEPB Inhale 2 puffs into the lungs every 12 (twelve) hours.   Yes [provider]  ibuprofen (ADVIL,MOTRIN) 100 MG tablet Take 100 mg by mouth every 6 (six) hours as needed. Reported on 05/31/2015   Yes [provider]  Insulin Glargine (BASAGLAR KWIKPEN) 100 UNIT/ML Inject 20 Units into the skin at bedtime. 03/09/19  Yes [provider]  levothyroxine (SYNTHROID, LEVOTHROID) 150 MCG tablet Take 150 mcg by mouth daily before breakfast.   Yes [provider]  lisinopril (PRINIVIL,ZESTRIL) 10 MG tablet Take 10 mg by mouth daily. auth number 05/29/2018 [id number] 04/23/15  Yes [provider]  loperamide (IMODIUM) 2 MG capsule Take 2 mg by mouth as needed for diarrhea or loose stools.  Yes [provider]  metFORMIN (GLUCOPHAGE-XR) 500 MG 24 hr tablet Take 500 mg by mouth daily. 02/05/18  Yes [provider]  NOVOLOG FLEXPEN 100 UNIT/ML FlexPen Inject 15 Units into the skin in the morning, at noon, and at bedtime. Per Sliding Scale 04/05/19  Yes [provider]  omeprazole (PRILOSEC) 40 MG capsule Take 40 mg by mouth 2 (two) times daily.  03/20/12  Yes [provider]  pravastatin (PRAVACHOL) 40 MG tablet Take 40 mg by mouth every  evening.  03/20/12  Yes [provider]  sertraline (ZOLOFT) 50 MG tablet TAKE 3 TABLETS BY MOUTH EVERY DAY 12/26/18  Yes Megan Salon, MD  verapamil (CALAN-SR) 240 MG CR tablet Take 240 mg by mouth 2 (two) times daily.   Yes [provider]  ACCU-CHEK AVIVA PLUS test strip USE TO CHECK SUGAR TWICE A DAY 90 12/12/18   [provider]  acyclovir (ZOVIRAX) 400 MG tablet Take 1 tablet (400 mg total) by mouth 2 (two) times daily. 04/28/19   Curt Bears, MD  dexamethasone (DECADRON) 4 MG tablet Take 1 tablet (4 mg total) by mouth once a week. Patient takes 5 tablets 1 day week prior to [facility] appointment. 04/28/19   Curt Bears, MD  EPIPEN 2-PAK 0.3 MG/0.3ML SOAJ injection Reported on 06/21/2015 12/21/14   [provider]  lenalidomide (REVLIMID) 25 MG capsule TAKE 1 CAPSULE BY MOUTH ONCE DAILY 21 DAYS ON AND 7 DAYS OFF 05/26/19   Curt Bears, MD  magic mouthwash w/lidocaine SOLN Take 5 mLs by mouth 4 (four) times daily as needed for mouth pain. Swish, Gargle, and spit 01/28/18   Tanner, Lyndon Code., PA-C  ondansetron (ZOFRAN-ODT) 8 MG disintegrating tablet Take 1 tablet (8 mg total) by mouth every 8 (eight) hours as needed. Reported on 05/31/2015 03/31/19   Heilingoetter, Cassandra L, PA-C  potassium chloride SA (KLOR-CON) 20 MEQ tablet Take 2 tablets (40 mEq total) by mouth 2 (two) times daily. 12/17/19   Curt Bears, MD  [facility] HFA 108 [phone] Base) MCG/ACT inhaler Reported on 06/21/2015 04/04/15   [provider]  warfarin (COUMADIN) 2 MG tablet Take 1 tablet (2 mg total) by mouth daily. 04/28/19   Curt Bears, MD     Vital Signs: BP (!) 152/86   Temp 98.5 F (36.9 C) (Oral)   Resp 18   Ht 5' 7"  (1.702 m)   Wt 260 lb (117.9 kg)   SpO2 100%   BMI 40.72 kg/m   Physical Exam awake, alert.  Chest clear to auscultation bilaterally.  Heart with regular rate and rhythm.  Abdomen obese, soft, positive bowel sounds, nontender.  No lower extremity  edema.  Imaging: DG Bone Survey Met  Result Date: 01/12/2020 CLINICAL DATA:  Multiple myeloma. EXAM: METASTATIC BONE SURVEY COMPARISON:  Bone survey 09/27/2008. FINDINGS: Standard bone survey with imaging of the axial and appendicular skeleton performed. Diffuse osteopenia and multifocal focal degenerative change. Stable benign appearing well corticated small cyst noted in the proximal left femoral metaphysis. No interim change. Findings consistent benign bone cyst. No lytic or sclerotic lesions are noted. No acute bony abnormality identified. IMPRESSION: 1. Stable benign-appearing cyst in the proximal left femoral metaphysis. No interim change from prior study of 09/27/2008. 2. No lytic or sclerotic lesions are noted to suggest myeloma or metastatic disease. Electronically Signed   By: Marcello Moores  Register   On: 01/12/2020 09:14    Labs:  CBC: Recent Labs    06/23/19 0973 09/15/19 0758 12/17/19  1115 01/15/20 0904  WBC 3.3* 4.1 5.1 5.1  HGB 9.3* 9.9* 10.6* 10.2*  HCT 30.3* 31.3* 33.1* 33.3*  PLT 114* 133* 123* 166    COAGS: No results for input(s): INR, APTT in the last 8760 hours.  BMP: Recent Labs    06/09/19 0755 06/16/19 0834 06/23/19 0839 09/15/19 0758 12/17/19 1115 12/23/19 1017  NA 140 137 140 136 132* 145  K 4.2 4.0 3.8 4.2 2.8* 3.7  CL 109 103 109 106 100 114*  CO2 19* 20* 20* 21* 20* 21*  GLUCOSE 123* 137* 127* 157* 208* 150*  BUN 8 10 7 10 9 13   CALCIUM 9.0 9.4 9.0 9.6 9.1 9.3  CREATININE 0.79 0.85 0.77 0.82 0.93 0.95  GFRNONAA >60 >60 >60 >60 >60 >60  GFRAA >60 >60 >60 >60  --   --     LIVER FUNCTION TESTS: Recent Labs    06/16/19 0834 06/23/19 0839 09/15/19 0758 12/17/19 1115  BILITOT 0.8 0.6 0.5 0.8  AST 44* 51* 46* 51*  ALT 40 51* 34 32  ALKPHOS 128* 128* 130* 97  PROT 7.1 6.6 7.3 7.5  ALBUMIN 4.0 3.6 3.9 3.7    Assessment and Plan: Patient familiar to IR service from bone marrow biopsies in 2010 and 2014.  She has a history of multiple myeloma  with questionable POEMS syndrome symptoms and prior treatment, currently under observation.  Repeat myeloma panel performed recently revealed elevated free lambda light chains.  She presents today for repeat CT-guided bone marrow biopsy for further evaluation. Risks and benefits of procedure was discussed with the patient  including, but not limited to bleeding, infection, damage to adjacent structures or low yield requiring additional tests.  All of the questions were answered and there is agreement to proceed.  Consent signed and in chart.     Electronically Signed: D. Rowe Robert, PA-C 01/15/2020, 9:18 AM   I spent a total of 20 minutes at the the patient's bedside AND on the patient's hospital floor or unit, greater than 50% of which was counseling/coordinating care for CT-guided bone marrow biopsy

## 2020-01-15 NOTE — Discharge Instructions (Signed)
Please call Interventional Radiology clinic [phone] with any questions or concerns.  You may remove your dressing and shower tomorrow.   Bone Marrow Aspiration and Bone Marrow Biopsy, Adult, Care After This sheet gives you information about how to care for yourself after your procedure. Your health care provider may also give you more specific instructions. If you have problems or questions, contact your health care provider. What can I expect after the procedure? After the procedure, it is common to have:  Mild pain and tenderness.  Swelling.  Bruising. Follow these instructions at home: Puncture site care   Follow instructions from your health care provider about how to take care of the puncture site. Make sure you: ? Wash your hands with soap and water before and after you change your bandage (dressing). If soap and water are not available, use hand sanitizer. ? Change your dressing as told by your health care provider.  Check your puncture site every day for signs of infection. Check for: ? More redness, swelling, or pain. ? Fluid or blood. ? Warmth. ? Pus or a bad smell. Activity  Return to your normal activities as told by your health care provider. Ask your health care provider what activities are safe for you.  Do not lift anything that is heavier than 10 lb (4.5 kg), or the limit that you are told, until your health care provider says that it is safe.  Do not drive for 24 hours if you were given a sedative during your procedure. General instructions   Take over-the-counter and prescription medicines only as told by your health care provider.  Do not take baths, swim, or use a hot tub until your health care provider approves. Ask your health care provider if you may take showers. You may only be allowed to take sponge baths.  If directed, put ice on the affected area. To do this: ? Put ice in a plastic bag. ? Place a towel between your skin and the  bag. ? Leave the ice on for 20 minutes, 2-3 times a day.  Keep all follow-up visits as told by your health care provider. This is important. Contact a health care provider if:  Your pain is not controlled with medicine.  You have a fever.  You have more redness, swelling, or pain around the puncture site.  You have fluid or blood coming from the puncture site.  Your puncture site feels warm to the touch.  You have pus or a bad smell coming from the puncture site. Summary  After the procedure, it is common to have mild pain, tenderness, swelling, and bruising.  Follow instructions from your health care provider about how to take care of the puncture site and what activities are safe for you.  Take over-the-counter and prescription medicines only as told by your health care provider.  Contact a health care provider if you have any signs of infection, such as fluid or blood coming from the puncture site. This information is not intended to replace advice given to you by your health care provider. Make sure you discuss any questions you have with your health care provider. Document Revised: 05/13/2018 Document Reviewed: 05/13/2018 Elsevier Patient Education  2020 [employer] Inc[employer]   Moderate Conscious Sedation, Adult, Care After These instructions provide you with information about caring for yourself after your procedure. Your health care provider may also give you more specific instructions. Your treatment has been planned according to current medical practices, but problems sometimes occur. Call   your health care provider if you have any problems or questions after your procedure. What can I expect after the procedure? After your procedure, it is common:  To feel sleepy for several hours.  To feel clumsy and have poor balance for several hours.  To have poor judgment for several hours.  To vomit if you eat too soon. Follow these instructions at home: For at least 24 hours after  the procedure:   Do not: ? Participate in activities where you could fall or become injured. ? Drive. ? Use heavy machinery. ? Drink alcohol. ? Take sleeping pills or medicines that cause drowsiness. ? Make important decisions or sign legal documents. ? Take care of children on your own.  Rest. Eating and drinking  Follow the diet recommended by your health care provider.  If you vomit: ? Drink water, juice, or soup when you can drink without vomiting. ? Make sure you have little or no nausea before eating solid foods. General instructions  Have a responsible adult stay with you until you are awake and alert.  Take over-the-counter and prescription medicines only as told by your health care provider.  If you smoke, do not smoke without supervision.  Keep all follow-up visits as told by your health care provider. This is important. Contact a health care provider if:  You keep feeling nauseous or you keep vomiting.  You feel light-headed.  You develop a rash.  You have a fever. Get help right away if:  You have trouble breathing. This information is not intended to replace advice given to you by your health care provider. Make sure you discuss any questions you have with your health care provider. Document Revised: 12/07/2016 Document Reviewed: 04/16/2015 Elsevier Patient Education  2020 [employer] Inc.  

## 2020-01-15 NOTE — Procedures (Signed)
Interventional Radiology Procedure Note  Procedure: CT guided aspirate and core biopsy of right iliac bone  Complications: None  Recommendations: - Bedrest supine x 1 hrs - Hydrocodone PRN  Pain - Follow biopsy results  Blood Loss: Minimal

## 2020-01-20 ENCOUNTER — Inpatient Hospital Stay: Payer: 59 | Attending: Internal Medicine | Admitting: Internal Medicine

## 2020-01-20 ENCOUNTER — Other Ambulatory Visit: Payer: Self-pay

## 2020-01-20 ENCOUNTER — Telehealth: Payer: Self-pay | Admitting: Internal Medicine

## 2020-01-20 VITALS — BP 152/78 | HR 74 | Temp 97.8°F | Resp 20 | Wt 255.5 lb

## 2020-01-20 DIAGNOSIS — D472 Monoclonal gammopathy: Secondary | ICD-10-CM | POA: Insufficient documentation

## 2020-01-20 DIAGNOSIS — R232 Flushing: Secondary | ICD-10-CM | POA: Insufficient documentation

## 2020-01-20 DIAGNOSIS — C9000 Multiple myeloma not having achieved remission: Secondary | ICD-10-CM | POA: Diagnosis not present

## 2020-01-20 DIAGNOSIS — I10 Essential (primary) hypertension: Secondary | ICD-10-CM | POA: Diagnosis not present

## 2020-01-20 DIAGNOSIS — R5383 Other fatigue: Secondary | ICD-10-CM | POA: Insufficient documentation

## 2020-01-20 DIAGNOSIS — Z79899 Other long term (current) drug therapy: Secondary | ICD-10-CM | POA: Diagnosis not present

## 2020-01-20 NOTE — Telephone Encounter (Signed)
Scheduled appointments per 1/12 los. Spoke to patient who is aware of appointments dates and times. Gave patient calendar print out.  

## 2020-01-20 NOTE — Progress Notes (Signed)
[place] [phone]   [phone]  OFFICE PROGRESS NOTE  [name], MD [place] [address]  DIAGNOSIS: Plasma cell dyscrasia initially diagnosed as MGUS in September 2010, with additional symptoms suggestive of POEMS syndrome.   PRIOR THERAPY:  1) Velcade 1.3 MG/M2 subcutaneously with Decadron 40 mg by mouth on a weekly basis. First cycle 11/24/2013. She status post 31 weekly doses of treatment. 2) Velcade 1.3 MG/M2 subcutaneously and weekly basis with Decadron 40 mg by mouth weekly. First dose 02/01/2015. Status post 28 cycles. 3) Revlimid 25 mg by mouth daily for 21 days every 4 weeks with weekly Decadron 20 mg. started in 11/27/2015. Status post 3 cycles discontinued secondary to lack of response.  CURRENT THERAPY: Systemic treatment with Velcade 1.3 MG/KG weekly, Revlimid 25 mg by mouth daily for 21 days every 4 weeks in addition to Decadron 20 mg by mouth weekly. First dose 03/06/2016. Status post 32 cycles.  Her treatment was on hold for the last few months.    INTERVAL HISTORY: Brenda Conner 60 y.o. female returns to the clinic today for follow-up visit.  The patient is feeling fine today with no concerning complaints except for hot flashes and fatigue.  She denied having any current chest pain, shortness of breath, cough or hemoptysis.  She denied having any fever or chills.  She has no nausea, vomiting, diarrhea or constipation.  She denied having any recent weight loss or night sweats.  She is currently on observation and feeling fine.  She had a bone marrow biopsy and aspirate recently and she is here for evaluation and discussion of her biopsy results and recommendation regarding her condition.  MEDICAL HISTORY: Past Medical History:  Diagnosis Date  . Acid reflux   . Anxiety   . Asthma   . Cancer [facility])    waldenstroms/ macroglobinulemia  . Depression   . Depression   . Diabetes mellitus without  complication ([place])   . Dysuria 02/28/2016  . Hypercholesteremia   . Hypertension   . Hypothyroidism   . Macroglobulinemia ([place])    ? POEMS syndrome  . Multiple myeloma ([place])   . Obesity   . PONV (postoperative nausea and vomiting)   . Sleep apnea    CPAP at bedtime  . Sleep apnea     ALLERGIES:  is allergic to codeine, hydrocodone, lortab [hydrocodone-acetaminophen], onion, shellfish allergy, and amoxicillin.  MEDICATIONS:  Current Outpatient Medications  Medication Sig Dispense Refill  . ACCU-CHEK AVIVA PLUS test strip USE TO CHECK SUGAR TWICE A DAY 90    . acyclovir (ZOVIRAX) 400 MG tablet Take 1 tablet (400 mg total) by mouth 2 (two) times daily. 180 tablet 1  . ALPRAZolam (XANAX) 0.5 MG tablet Take 1 tablet (0.5 mg total) by mouth at bedtime as needed for anxiety. 30 tablet 1  . Azelastine HCl 0.15 % SOLN as needed.    . B-D UF III MINI PEN NEEDLES 31G X 5 MM MISC     . Blood Glucose Monitoring Suppl (ONE TOUCH ULTRA MINI) W/DEVICE KIT See admin instructions. Reported on 01/25/2015  0  . Cetirizine HCl (ZYRTEC ALLERGY PO) Take by mouth daily.    [name] dexamethasone (DECADRON) 4 MG tablet Take 1 tablet (4 mg total) by mouth once a week. Patient takes 5 tablets 1 day week prior to [facility] appointment. 40 tablet 1  . DiphenhydrAMINE HCl (BENADRYL ALLERGY PO) Take by mouth  as needed.    [name] EPIPEN 2-PAK 0.3 MG/0.3ML SOAJ injection Reported on 06/21/2015  1  . Fexofenadine HCl (ALLEGRA ALLERGY PO) Take by mouth daily.    . Fluticasone-Salmeterol (ADVAIR) 100-50 MCG/DOSE AEPB Inhale 2 puffs into the lungs every 12 (twelve) hours.    [name] ibuprofen (ADVIL,MOTRIN) 100 MG tablet Take 100 mg by mouth every 6 (six) hours as needed. Reported on 05/31/2015    . Insulin Glargine (BASAGLAR KWIKPEN) 100 UNIT/ML Inject 20 Units into the skin at bedtime.    [name] lenalidomide (REVLIMID) 25 MG capsule TAKE 1 CAPSULE BY MOUTH ONCE DAILY 21 DAYS ON AND 7 DAYS OFF 21 capsule 0  . levothyroxine (SYNTHROID,  LEVOTHROID) 150 MCG tablet Take 150 mcg by mouth daily before breakfast.    . lisinopril (PRINIVIL,ZESTRIL) 10 MG tablet Take 10 mg by mouth daily. auth number 05/29/2018 [id number]  3  . loperamide (IMODIUM) 2 MG capsule Take 2 mg by mouth as needed for diarrhea or loose stools.    . magic mouthwash w/lidocaine SOLN Take 5 mLs by mouth 4 (four) times daily as needed for mouth pain. Swish, Gargle, and spit 240 mL 1  . metFORMIN (GLUCOPHAGE-XR) 500 MG 24 hr tablet Take 500 mg by mouth daily.    [name] NOVOLOG FLEXPEN 100 UNIT/ML FlexPen Inject 15 Units into the skin in the morning, at noon, and at bedtime. Per Sliding Scale    . omeprazole (PRILOSEC) 40 MG capsule Take 40 mg by mouth 2 (two) times daily.     . ondansetron (ZOFRAN-ODT) 8 MG disintegrating tablet Take 1 tablet (8 mg total) by mouth every 8 (eight) hours as needed. Reported on 05/31/2015 30 tablet 2  . potassium chloride SA (KLOR-CON) 20 MEQ tablet Take 2 tablets (40 mEq total) by mouth 2 (two) times daily. 14 tablet 0  . pravastatin (PRAVACHOL) 40 MG tablet Take 40 mg by mouth every evening.     [name] PROAIR HFA 108 (90 Base) MCG/ACT inhaler Reported on 06/21/2015  1  . sertraline (ZOLOFT) 50 MG tablet TAKE 3 TABLETS BY MOUTH EVERY DAY 270 tablet 3  . verapamil (CALAN-SR) 240 MG CR tablet Take 240 mg by mouth 2 (two) times daily.    [name] warfarin (COUMADIN) 2 MG tablet Take 1 tablet (2 mg total) by mouth daily. 90 tablet 0   No current facility-administered medications for this visit.    SURGICAL HISTORY:  Past Surgical History:  Procedure Laterality Date  . ABLATION  09/2007   HTA and polyp resection  . BACK SURGERY  03/2004   herniation, L4-L5  . Bil Laprascopic knee surgery    . BONE MARROW BIOPSY     2011  . BONE MARROW BIOPSY  4/14  . CHOLECYSTECTOMY    . FOOT SURGERY  1998  . KNEE ARTHROSCOPY W/ MENISCAL REPAIR  10/10 ; 3/11  . LAPAROSCOPIC CHOLECYSTECTOMY  1997  . NASAL SINUS SURGERY  2004  . UTERINE FIBROID EMBOLIZATION       REVIEW OF SYSTEMS:  Constitutional: positive for fatigue Eyes: negative Ears, nose, mouth, throat, and face: negative Respiratory: negative Cardiovascular: negative Gastrointestinal: negative Genitourinary:negative Integument/breast: negative Hematologic/lymphatic: negative Musculoskeletal:negative Neurological: negative Behavioral/Psych: negative Endocrine: negative Allergic/Immunologic: negative   PHYSICAL EXAMINATION: General appearance: alert, cooperative, fatigued and no distress Head: Normocephalic, without obvious abnormality, atraumatic Neck: no adenopathy Lymph nodes: Cervical, supraclavicular, and axillary nodes normal. Resp: clear to auscultation bilaterally Back: symmetric, no curvature. ROM normal. No CVA tenderness. Cardio: regular rate and rhythm, S1,  S2 normal, no murmur, click, rub or gallop GI: soft, non-tender; bowel sounds normal; no masses,  no organomegaly Extremities: extremities normal, atraumatic, no cyanosis or edema Neurologic: Alert and oriented X 3, normal strength and tone. Normal symmetric reflexes. Normal coordination and gait  ECOG PERFORMANCE STATUS: 1 - Symptomatic but completely ambulatory  Blood pressure (!) 152/78, pulse 74, temperature 97.8 F (36.6 C), temperature source Tympanic, resp. rate 20, weight 255 lb 8 oz (115.9 kg), SpO2 100 %.  LABORATORY DATA: Lab Results  Component Value Date   WBC 5.1 01/15/2020   HGB 10.2 (L) 01/15/2020   HCT 33.3 (L) 01/15/2020   MCV 86.0 01/15/2020   PLT 166 01/15/2020      Chemistry      Component Value Date/Time   NA 138 01/15/2020 0904   NA 138 11/14/2016 0824   K 4.3 01/15/2020 0904   K 4.3 11/14/2016 0824   CL 101 01/15/2020 0904   CL 104 04/07/2012 1415   CO2 24 01/15/2020 0904   CO2 23 11/14/2016 0824   BUN 11 01/15/2020 0904   BUN 9.1 11/14/2016 0824   CREATININE 0.91 01/15/2020 0904   CREATININE 0.95 12/23/2019 1017   CREATININE 0.7 11/14/2016 0824      Component Value  Date/Time   CALCIUM 9.8 01/15/2020 0904   CALCIUM 9.6 11/14/2016 0824   ALKPHOS 97 12/17/2019 1115   ALKPHOS 94 11/14/2016 0824   AST 51 (H) 12/17/2019 1115   AST 28 11/14/2016 0824   ALT 32 12/17/2019 1115   ALT 24 11/14/2016 0824   BILITOT 0.8 12/17/2019 1115   BILITOT 0.41 11/14/2016 0824      ASSESSMENT AND PLAN:  This is a very pleasant 60 years old white female with smoldering multiple myeloma with questionable POEMS syndrome symptom.  The patient has been on treatment with subcutaneous weekly Velcade as well as Revlimid and Decadron status post 34 cycles.  She has been tolerating this treatment well. The patient has been on observation for the last several months and she has been doing fine with no concerning issues except for the recent upper respiratory infection and viral gastroenteritis. Her myeloma panel showed continuous increase in her free lambda light chain. The patient had a bone marrow biopsy and aspirate recently that showed 3-5% plasma cells still suspicious for plasma cell dyscrasia.  The skeletal bone survey was negative for any lytic lesions. I had a lengthy discussion with the patient today about her condition and treatment options. I recommended for the patient to have 24-hour urine protein electrophoresis as well as SPEP with immunofixation. I recommended for the patient to continue on observation for now with repeat myeloma panel in 2 months. She was advised to call immediately if she has any other concerning symptoms in the interval. All questions were answered. The patient knows to call the clinic with any problems, questions or concerns. We can certainly see the patient much sooner if necessary.  Disclaimer: This note was dictated with voice recognition software. Similar sounding words can inadvertently be transcribed and may not be corrected upon review.

## 2020-01-25 ENCOUNTER — Encounter (HOSPITAL_COMMUNITY): Payer: Self-pay | Admitting: Internal Medicine

## 2020-01-25 LAB — SURGICAL PATHOLOGY

## 2020-01-27 ENCOUNTER — Encounter: Payer: Self-pay | Admitting: Internal Medicine

## 2020-01-27 ENCOUNTER — Inpatient Hospital Stay: Payer: 59

## 2020-01-28 ENCOUNTER — Telehealth: Payer: Self-pay | Admitting: Internal Medicine

## 2020-01-28 NOTE — Telephone Encounter (Signed)
Left message with scheduled follow-up appointments per 1/19 schedule message. Gave option to call back to reschedule if needed.

## 2020-02-02 ENCOUNTER — Inpatient Hospital Stay: Payer: 59

## 2020-02-02 ENCOUNTER — Other Ambulatory Visit: Payer: Self-pay

## 2020-02-02 DIAGNOSIS — C9000 Multiple myeloma not having achieved remission: Secondary | ICD-10-CM

## 2020-02-02 DIAGNOSIS — D472 Monoclonal gammopathy: Secondary | ICD-10-CM | POA: Diagnosis not present

## 2020-02-04 DIAGNOSIS — D472 Monoclonal gammopathy: Secondary | ICD-10-CM | POA: Diagnosis not present

## 2020-02-04 LAB — PROTEIN ELECTROPHORESIS, SERUM, WITH REFLEX
A/G Ratio: 1.2 (ref 0.7–1.7)
Albumin ELP: 3.8 g/dL (ref 2.9–4.4)
Alpha-1-Globulin: 0.2 g/dL (ref 0.0–0.4)
Alpha-2-Globulin: 0.8 g/dL (ref 0.4–1.0)
Beta Globulin: 1.2 g/dL (ref 0.7–1.3)
Gamma Globulin: 1 g/dL (ref 0.4–1.8)
Globulin, Total: 3.2 g/dL (ref 2.2–3.9)
M-Spike, %: 0.5 g/dL — ABNORMAL HIGH
SPEP Interpretation: 0
Total Protein ELP: 7 g/dL (ref 6.0–8.5)

## 2020-02-04 LAB — IMMUNOFIXATION REFLEX, SERUM
IgA: 127 mg/dL (ref 87–352)
IgG (Immunoglobin G), Serum: 523 mg/dL — ABNORMAL LOW (ref 586–1602)
IgM (Immunoglobulin M), Srm: 616 mg/dL — ABNORMAL HIGH (ref 26–217)

## 2020-02-08 LAB — UIFE/LIGHT CHAINS/TP QN, 24-HR UR
FR KAPPA LT CH,24HR: 42.15 mg/24 hr
FR LAMBDA LT CH,24HR: 10.82 mg/24 hr
Free Kappa Lt Chains,Ur: 10.28 mg/L (ref 0.63–113.79)
Free Kappa/Lambda Ratio: 3.89 (ref 1.03–31.76)
Free Lambda Lt Chains,Ur: 2.64 mg/L (ref 0.47–11.77)
Total Protein, Urine-Ur/day: <164 mg/24 hr — ABNORMAL HIGH (ref 30–150)
Total Protein, Urine: <4 mg/dL
Total Volume: 4100

## 2020-02-12 ENCOUNTER — Telehealth: Payer: Self-pay

## 2020-02-12 NOTE — Telephone Encounter (Signed)
Brenda Conner with pts dental office called stating pt was there for a dental extraction but needed clearance. Discussed this with [name], PA-C who consulted dr. Julien Nordmann and gave clearance for pt to have her tooth pulled. This has been communicated back to [name]. Also of note from the pt, she states she hasn't taken the Warfarin since July 2021.

## 2020-02-18 ENCOUNTER — Encounter: Payer: Self-pay | Admitting: Internal Medicine

## 2020-02-24 ENCOUNTER — Other Ambulatory Visit: Payer: 59

## 2020-03-03 ENCOUNTER — Ambulatory Visit: Payer: 59 | Admitting: Internal Medicine

## 2020-03-04 ENCOUNTER — Ambulatory Visit: Payer: BC Managed Care – PPO

## 2020-03-23 ENCOUNTER — Ambulatory Visit: Payer: 59 | Admitting: Internal Medicine

## 2020-03-23 ENCOUNTER — Inpatient Hospital Stay: Payer: 59 | Attending: Internal Medicine

## 2020-03-23 ENCOUNTER — Other Ambulatory Visit: Payer: Self-pay

## 2020-03-23 DIAGNOSIS — D472 Monoclonal gammopathy: Secondary | ICD-10-CM | POA: Insufficient documentation

## 2020-03-23 DIAGNOSIS — Z880 Allergy status to penicillin: Secondary | ICD-10-CM | POA: Diagnosis not present

## 2020-03-23 DIAGNOSIS — C9000 Multiple myeloma not having achieved remission: Secondary | ICD-10-CM | POA: Diagnosis not present

## 2020-03-23 DIAGNOSIS — Z7901 Long term (current) use of anticoagulants: Secondary | ICD-10-CM | POA: Insufficient documentation

## 2020-03-23 DIAGNOSIS — Z9049 Acquired absence of other specified parts of digestive tract: Secondary | ICD-10-CM | POA: Diagnosis not present

## 2020-03-23 DIAGNOSIS — R5383 Other fatigue: Secondary | ICD-10-CM | POA: Insufficient documentation

## 2020-03-23 DIAGNOSIS — Z885 Allergy status to narcotic agent status: Secondary | ICD-10-CM | POA: Diagnosis not present

## 2020-03-23 DIAGNOSIS — Z79899 Other long term (current) drug therapy: Secondary | ICD-10-CM | POA: Diagnosis not present

## 2020-03-23 LAB — CBC WITH DIFFERENTIAL (CANCER CENTER ONLY)
Abs Immature Granulocytes: 0.02 10*3/uL (ref 0.00–0.07)
Basophils Absolute: 0 10*3/uL (ref 0.0–0.1)
Basophils Relative: 1 %
Eosinophils Absolute: 0.1 10*3/uL (ref 0.0–0.5)
Eosinophils Relative: 2 %
HCT: 28.7 % — ABNORMAL LOW (ref 36.0–46.0)
Hemoglobin: 8.7 g/dL — ABNORMAL LOW (ref 12.0–15.0)
Immature Granulocytes: 1 %
Lymphocytes Relative: 23 %
Lymphs Abs: 0.8 10*3/uL (ref 0.7–4.0)
MCH: 25.3 pg — ABNORMAL LOW (ref 26.0–34.0)
MCHC: 30.3 g/dL (ref 30.0–36.0)
MCV: 83.4 fL (ref 80.0–100.0)
Monocytes Absolute: 0.2 10*3/uL (ref 0.1–1.0)
Monocytes Relative: 7 %
Neutro Abs: 2.3 10*3/uL (ref 1.7–7.7)
Neutrophils Relative %: 66 %
Platelet Count: 130 10*3/uL — ABNORMAL LOW (ref 150–400)
RBC: 3.44 MIL/uL — ABNORMAL LOW (ref 3.87–5.11)
RDW: 14.7 % (ref 11.5–15.5)
WBC Count: 3.4 10*3/uL — ABNORMAL LOW (ref 4.0–10.5)
nRBC: 0 % (ref 0.0–0.2)

## 2020-03-23 LAB — LACTATE DEHYDROGENASE: LDH: 134 U/L (ref 98–192)

## 2020-03-23 LAB — CMP (CANCER CENTER ONLY)
ALT: 20 U/L (ref 0–44)
AST: 30 U/L (ref 15–41)
Albumin: 3.8 g/dL (ref 3.5–5.0)
Alkaline Phosphatase: 131 U/L — ABNORMAL HIGH (ref 38–126)
Anion gap: 9 (ref 5–15)
BUN: 9 mg/dL (ref 6–20)
CO2: 24 mmol/L (ref 22–32)
Calcium: 9.1 mg/dL (ref 8.9–10.3)
Chloride: 106 mmol/L (ref 98–111)
Creatinine: 0.78 mg/dL (ref 0.44–1.00)
GFR, Estimated: >60 mL/min (ref >60)
Glucose, Bld: 111 mg/dL — ABNORMAL HIGH (ref 70–99)
Potassium: 3.5 mmol/L (ref 3.5–5.1)
Sodium: 139 mmol/L (ref 135–145)
Total Bilirubin: 0.5 mg/dL (ref 0.3–1.2)
Total Protein: 7.1 g/dL (ref 6.5–8.1)

## 2020-03-24 LAB — KAPPA/LAMBDA LIGHT CHAINS
Kappa free light chain: 12.8 mg/L (ref 3.3–19.4)
Kappa, lambda light chain ratio: 0.02 — ABNORMAL LOW (ref 0.26–1.65)
Lambda free light chains: 843.4 mg/L — ABNORMAL HIGH (ref 5.7–26.3)

## 2020-03-24 LAB — IGG, IGA, IGM
IgA: 108 mg/dL (ref 87–352)
IgG (Immunoglobin G), Serum: 506 mg/dL — ABNORMAL LOW (ref 586–1602)
IgM (Immunoglobulin M), Srm: 550 mg/dL — ABNORMAL HIGH (ref 26–217)

## 2020-03-24 LAB — BETA 2 MICROGLOBULIN, SERUM: Beta-2 Microglobulin: 2 mg/L (ref 0.6–2.4)

## 2020-03-31 ENCOUNTER — Other Ambulatory Visit: Payer: Self-pay

## 2020-03-31 ENCOUNTER — Inpatient Hospital Stay: Payer: 59 | Admitting: Internal Medicine

## 2020-03-31 VITALS — BP 151/73 | HR 73 | Temp 97.9°F | Resp 16 | Ht 67.0 in | Wt 257.8 lb

## 2020-03-31 DIAGNOSIS — C9000 Multiple myeloma not having achieved remission: Secondary | ICD-10-CM

## 2020-03-31 DIAGNOSIS — D472 Monoclonal gammopathy: Secondary | ICD-10-CM | POA: Diagnosis not present

## 2020-03-31 DIAGNOSIS — D539 Nutritional anemia, unspecified: Secondary | ICD-10-CM

## 2020-03-31 NOTE — Progress Notes (Signed)
[place] [phone]   [phone]) [phone]  OFFICE PROGRESS NOTE  [name], MD [place] [address]  DIAGNOSIS: Plasma cell dyscrasia initially diagnosed as MGUS in September 2010, with additional symptoms suggestive of POEMS syndrome.   PRIOR THERAPY:  1) Velcade 1.3 MG/M2 subcutaneously with Decadron 40 mg by mouth on a weekly basis. First cycle 11/24/2013. She status post 31 weekly doses of treatment. 2) Velcade 1.3 MG/M2 subcutaneously and weekly basis with Decadron 40 mg by mouth weekly. First dose 02/01/2015. Status post 28 cycles. 3) Revlimid 25 mg by mouth daily for 21 days every 4 weeks with weekly Decadron 20 mg. started in 11/27/2015. Status post 3 cycles discontinued secondary to lack of response.  CURRENT THERAPY: Systemic treatment with Velcade 1.3 MG/KG weekly, Revlimid 25 mg by mouth daily for 21 days every 4 weeks in addition to Decadron 20 mg by mouth weekly. First dose 03/06/2016. Status post 32 cycles.  Her treatment was on hold for the last few months.    INTERVAL HISTORY: Brenda Conner 60 y.o. female returns to the clinic today for follow-up visit.  The patient is feeling fine today with no concerning complaints except for increasing fatigue.  She enjoyed her birthday in [place] with her sister.  She denied having any current chest pain but has shortness of breath with exertion with no cough or hemoptysis.  She denied having any fever or chills.  She has no nausea, vomiting, diarrhea or constipation.  She has no headache or visual changes.  She has no bleeding, bruises or ecchymosis.  The patient had repeat myeloma panel performed recently and she is here for evaluation and discussion of her lab results.   MEDICAL HISTORY: Past Medical History:  Diagnosis Date  . Acid reflux   . Anxiety   . Asthma   . Cancer [facility])    waldenstroms/ macroglobinulemia  . Depression   . Depression   . Diabetes  mellitus without complication ([place])   . Dysuria 02/28/2016  . Hypercholesteremia   . Hypertension   . Hypothyroidism   . Macroglobulinemia ([place])    ? POEMS syndrome  . Multiple myeloma ([place])   . Obesity   . PONV (postoperative nausea and vomiting)   . Sleep apnea    CPAP at bedtime  . Sleep apnea     ALLERGIES:  is allergic to codeine, hydrocodone, lortab [hydrocodone-acetaminophen], onion, shellfish allergy, and amoxicillin.  MEDICATIONS:  Current Outpatient Medications  Medication Sig Dispense Refill  . ACCU-CHEK AVIVA PLUS test strip USE TO CHECK SUGAR TWICE A DAY 90    . acyclovir (ZOVIRAX) 400 MG tablet Take 1 tablet (400 mg total) by mouth 2 (two) times daily. (Patient not taking: Reported on 01/20/2020) 180 tablet 1  . ALPRAZolam (XANAX) 0.5 MG tablet Take 1 tablet (0.5 mg total) by mouth at bedtime as needed for anxiety. 30 tablet 1  . Azelastine HCl 0.15 % SOLN as needed.    . B-D UF III MINI PEN NEEDLES 31G X 5 MM MISC     . Blood Glucose Monitoring Suppl (ONE TOUCH ULTRA MINI) W/DEVICE KIT See admin instructions. Reported on 01/25/2015  0  . Cetirizine HCl (ZYRTEC ALLERGY PO) Take by mouth daily.    . DiphenhydrAMINE HCl (BENADRYL ALLERGY PO) Take by mouth as needed.    [name] EPIPEN 2-PAK 0.3 MG/0.3ML SOAJ injection Reported on 06/21/2015  1  . Fexofenadine HCl (  ALLEGRA ALLERGY PO) Take by mouth daily.    . Fluticasone-Salmeterol (ADVAIR) 100-50 MCG/DOSE AEPB Inhale 2 puffs into the lungs every 12 (twelve) hours.    [name] ibuprofen (ADVIL,MOTRIN) 100 MG tablet Take 100 mg by mouth every 6 (six) hours as needed. Reported on 05/31/2015    . Insulin Glargine (BASAGLAR KWIKPEN) 100 UNIT/ML Inject 20 Units into the skin at bedtime.    [name] lenalidomide (REVLIMID) 25 MG capsule TAKE 1 CAPSULE BY MOUTH ONCE DAILY 21 DAYS ON AND 7 DAYS OFF (Patient not taking: Reported on 01/20/2020) 21 capsule 0  . levothyroxine (SYNTHROID, LEVOTHROID) 150 MCG tablet Take 150 mcg by mouth daily before breakfast.     . lisinopril (PRINIVIL,ZESTRIL) 10 MG tablet Take 10 mg by mouth daily. auth number 05/29/2018 [id number]  3  . loperamide (IMODIUM) 2 MG capsule Take 2 mg by mouth as needed for diarrhea or loose stools.    . magic mouthwash w/lidocaine SOLN Take 5 mLs by mouth 4 (four) times daily as needed for mouth pain. Swish, Gargle, and spit 240 mL 1  . metFORMIN (GLUCOPHAGE-XR) 500 MG 24 hr tablet Take 500 mg by mouth daily.    [name] NOVOLOG FLEXPEN 100 UNIT/ML FlexPen Inject 15 Units into the skin in the morning, at noon, and at bedtime. Per Sliding Scale    . omeprazole (PRILOSEC) 40 MG capsule Take 40 mg by mouth 2 (two) times daily.     . ondansetron (ZOFRAN-ODT) 8 MG disintegrating tablet Take 1 tablet (8 mg total) by mouth every 8 (eight) hours as needed. Reported on 05/31/2015 30 tablet 2  . potassium chloride SA (KLOR-CON) 20 MEQ tablet Take 2 tablets (40 mEq total) by mouth 2 (two) times daily. 14 tablet 0  . pravastatin (PRAVACHOL) 40 MG tablet Take 40 mg by mouth every evening.     [name] PROAIR HFA 108 (90 Base) MCG/ACT inhaler Reported on 06/21/2015  1  . sertraline (ZOLOFT) 50 MG tablet TAKE 3 TABLETS BY MOUTH EVERY DAY 270 tablet 3  . verapamil (CALAN-SR) 240 MG CR tablet Take 240 mg by mouth 2 (two) times daily.    [name] warfarin (COUMADIN) 2 MG tablet Take 1 tablet (2 mg total) by mouth daily. 90 tablet 0   No current facility-administered medications for this visit.    SURGICAL HISTORY:  Past Surgical History:  Procedure Laterality Date  . ABLATION  09/2007   HTA and polyp resection  . BACK SURGERY  03/2004   herniation, L4-L5  . Bil Laprascopic knee surgery    . BONE MARROW BIOPSY     2011  . BONE MARROW BIOPSY  4/14  . CHOLECYSTECTOMY    . FOOT SURGERY  1998  . KNEE ARTHROSCOPY W/ MENISCAL REPAIR  10/10 ; 3/11  . LAPAROSCOPIC CHOLECYSTECTOMY  1997  . NASAL SINUS SURGERY  2004  . UTERINE FIBROID EMBOLIZATION      REVIEW OF SYSTEMS:  Constitutional: positive for fatigue Eyes:  negative Ears, nose, mouth, throat, and face: negative Respiratory: negative Cardiovascular: negative Gastrointestinal: negative Genitourinary:negative Integument/breast: negative Hematologic/lymphatic: negative Musculoskeletal:negative Neurological: negative Behavioral/Psych: negative Endocrine: negative Allergic/Immunologic: negative   PHYSICAL EXAMINATION: General appearance: alert, cooperative, fatigued and no distress Head: Normocephalic, without obvious abnormality, atraumatic Neck: no adenopathy Lymph nodes: Cervical, supraclavicular, and axillary nodes normal. Resp: clear to auscultation bilaterally Back: symmetric, no curvature. ROM normal. No CVA tenderness. Cardio: regular rate and rhythm, S1, S2 normal, no murmur, click, rub or gallop GI: soft, non-tender; bowel sounds normal; no  masses,  no organomegaly Extremities: extremities normal, atraumatic, no cyanosis or edema Neurologic: Alert and oriented X 3, normal strength and tone. Normal symmetric reflexes. Normal coordination and gait  ECOG PERFORMANCE STATUS: 1 - Symptomatic but completely ambulatory  Blood pressure (!) 151/73, pulse 73, temperature 97.9 F (36.6 C), temperature source Tympanic, resp. rate 16, height _0  (1.702 m), weight 257 lb 12.8 oz (116.9 kg), SpO2 100 %.  LABORATORY DATA: Lab Results  Component Value Date   WBC 3.4 (L) 03/23/2020   HGB 8.7 (L) 03/23/2020   HCT 28.7 (L) 03/23/2020   MCV 83.4 03/23/2020   PLT 130 (L) 03/23/2020      Chemistry      Component Value Date/Time   NA 139 03/23/2020 0818   NA 138 11/14/2016 0824   K 3.5 03/23/2020 0818   K 4.3 11/14/2016 0824   CL 106 03/23/2020 0818   CL 104 04/07/2012 1415   CO2 24 03/23/2020 0818   CO2 23 11/14/2016 0824   BUN 9 03/23/2020 0818   BUN 9.1 11/14/2016 0824   CREATININE 0.78 03/23/2020 0818   CREATININE 0.7 11/14/2016 0824      Component Value Date/Time   CALCIUM 9.1 03/23/2020 0818   CALCIUM 9.6 11/14/2016 0824    ALKPHOS 131 (H) 03/23/2020 0818   ALKPHOS 94 11/14/2016 0824   AST 30 03/23/2020 0818   AST 28 11/14/2016 0824   ALT 20 03/23/2020 0818   ALT 24 11/14/2016 0824   BILITOT 0.5 03/23/2020 0818   BILITOT 0.41 11/14/2016 0824      ASSESSMENT AND PLAN:  This is a very pleasant 60 years old white female with smoldering multiple myeloma with questionable POEMS syndrome symptom.  The patient has been on treatment with subcutaneous weekly Velcade as well as Revlimid and Decadron status post 34 cycles.  She has been tolerating this treatment well. The patient has been on observation for the last several months and she has been doing fine with no concerning issues except for the recent upper respiratory infection and viral gastroenteritis. Her myeloma panel showed continuous increase in her free lambda light chain. The patient had a bone marrow biopsy and aspirate recently that showed 3-5% plasma cells still suspicious for plasma cell dyscrasia.  The skeletal bone survey was negative for any lytic lesions. The patient had repeat myeloma panel performed recently.  I discussed the lab results with the patient.  She has no clear evidence for progression.  She continues to have persistent anemia that has been worse than few months ago. I recommended for her to continue on observation with repeat myeloma panel in 3 months. Regarding the worsening anemia, we will check her stool for Hemoccult in addition to full anemia panel including iron study, ferritin, vitamin B12, serum folate as well as erythropoietin and reticulocyte count. If there is any concerning abnormality on the anemia panel, we will call the patient with recommendation but in the meantime she was advised to take over-the-counter oral iron tablets with vitamin C. She was advised to call immediately if she has any other concerning symptoms in the interval. All questions were answered. The patient knows to call the clinic with any problems,  questions or concerns. We can certainly see the patient much sooner if necessary.  Disclaimer: This note was dictated with voice recognition software. Similar sounding words can inadvertently be transcribed and may not be corrected upon review.

## 2020-04-07 ENCOUNTER — Other Ambulatory Visit: Payer: Self-pay

## 2020-04-07 ENCOUNTER — Inpatient Hospital Stay: Payer: 59

## 2020-04-07 DIAGNOSIS — C9000 Multiple myeloma not having achieved remission: Secondary | ICD-10-CM | POA: Diagnosis not present

## 2020-04-07 DIAGNOSIS — D539 Nutritional anemia, unspecified: Secondary | ICD-10-CM

## 2020-04-07 LAB — IRON AND TIBC
Iron: 41 ug/dL (ref 41–142)
Saturation Ratios: 8 % — ABNORMAL LOW (ref 21–57)
TIBC: 523 ug/dL — ABNORMAL HIGH (ref 236–444)
UIBC: 482 ug/dL — ABNORMAL HIGH (ref 120–384)

## 2020-04-07 LAB — RETICULOCYTES
Immature Retic Fract: 27.4 % — ABNORMAL HIGH (ref 2.3–15.9)
RBC.: 3.75 MIL/uL — ABNORMAL LOW (ref 3.87–5.11)
Retic Count, Absolute: 69.4 10*3/uL (ref 19.0–186.0)
Retic Ct Pct: 1.9 % (ref 0.4–3.1)

## 2020-04-07 LAB — CBC WITH DIFFERENTIAL (CANCER CENTER ONLY)
Abs Immature Granulocytes: 0.02 10*3/uL (ref 0.00–0.07)
Basophils Absolute: 0 10*3/uL (ref 0.0–0.1)
Basophils Relative: 1 %
Eosinophils Absolute: 0.1 10*3/uL (ref 0.0–0.5)
Eosinophils Relative: 2 %
HCT: 29.6 % — ABNORMAL LOW (ref 36.0–46.0)
Hemoglobin: 9.5 g/dL — ABNORMAL LOW (ref 12.0–15.0)
Immature Granulocytes: 0 %
Lymphocytes Relative: 18 %
Lymphs Abs: 0.9 10*3/uL (ref 0.7–4.0)
MCH: 25.8 pg — ABNORMAL LOW (ref 26.0–34.0)
MCHC: 32.1 g/dL (ref 30.0–36.0)
MCV: 80.4 fL (ref 80.0–100.0)
Monocytes Absolute: 0.4 10*3/uL (ref 0.1–1.0)
Monocytes Relative: 7 %
Neutro Abs: 3.6 10*3/uL (ref 1.7–7.7)
Neutrophils Relative %: 72 %
Platelet Count: 141 10*3/uL — ABNORMAL LOW (ref 150–400)
RBC: 3.68 MIL/uL — ABNORMAL LOW (ref 3.87–5.11)
RDW: 14.9 % (ref 11.5–15.5)
WBC Count: 5.1 10*3/uL (ref 4.0–10.5)
nRBC: 0 % (ref 0.0–0.2)

## 2020-04-07 LAB — FOLATE: Folate: 6.7 ng/mL (ref >5.9)

## 2020-04-07 LAB — FERRITIN: Ferritin: 25 ng/mL (ref 11–307)

## 2020-04-07 LAB — VITAMIN B12: Vitamin B-12: 293 pg/mL (ref 180–914)

## 2020-04-07 LAB — OCCULT BLOOD X 1 CARD TO LAB, STOOL
Fecal Occult Bld: NEGATIVE
Fecal Occult Bld: NEGATIVE
Fecal Occult Bld: NEGATIVE

## 2020-04-08 LAB — ERYTHROPOIETIN: Erythropoietin: 51.1 m[IU]/mL — ABNORMAL HIGH (ref 2.6–18.5)

## 2020-04-13 ENCOUNTER — Other Ambulatory Visit: Payer: Self-pay | Admitting: Internal Medicine

## 2020-04-13 ENCOUNTER — Encounter: Payer: Self-pay | Admitting: Internal Medicine

## 2020-04-13 MED ORDER — INTEGRA PLUS PO CAPS: 1.0000 | ORAL_CAPSULE | Freq: Every day | ORAL | 2 refills | Status: DC

## 2020-05-03 ENCOUNTER — Encounter: Payer: Self-pay | Admitting: Internal Medicine

## 2020-05-05 ENCOUNTER — Telehealth: Payer: Self-pay | Admitting: Internal Medicine

## 2020-05-05 NOTE — Telephone Encounter (Signed)
R/s appts per 4/27 sch msg. Called pt, no answer. Left msg with appts dates and times.  

## 2020-05-11 ENCOUNTER — Ambulatory Visit (HOSPITAL_BASED_OUTPATIENT_CLINIC_OR_DEPARTMENT_OTHER): Payer: 59 | Admitting: Obstetrics & Gynecology

## 2020-06-30 ENCOUNTER — Other Ambulatory Visit: Payer: 59

## 2020-07-07 ENCOUNTER — Ambulatory Visit: Payer: 59 | Admitting: Internal Medicine

## 2020-07-07 ENCOUNTER — Inpatient Hospital Stay: Payer: 59 | Attending: Internal Medicine

## 2020-07-07 ENCOUNTER — Other Ambulatory Visit: Payer: Self-pay

## 2020-07-07 DIAGNOSIS — C9000 Multiple myeloma not having achieved remission: Secondary | ICD-10-CM | POA: Insufficient documentation

## 2020-07-07 LAB — CBC WITH DIFFERENTIAL (CANCER CENTER ONLY)
Abs Immature Granulocytes: 0.01 10*3/uL (ref 0.00–0.07)
Basophils Absolute: 0 10*3/uL (ref 0.0–0.1)
Basophils Relative: 1 %
Eosinophils Absolute: 0.1 10*3/uL (ref 0.0–0.5)
Eosinophils Relative: 2 %
HCT: 31.4 % — ABNORMAL LOW (ref 36.0–46.0)
Hemoglobin: 10.3 g/dL — ABNORMAL LOW (ref 12.0–15.0)
Immature Granulocytes: 0 %
Lymphocytes Relative: 18 %
Lymphs Abs: 0.7 10*3/uL (ref 0.7–4.0)
MCH: 28.5 pg (ref 26.0–34.0)
MCHC: 32.8 g/dL (ref 30.0–36.0)
MCV: 86.7 fL (ref 80.0–100.0)
Monocytes Absolute: 0.3 10*3/uL (ref 0.1–1.0)
Monocytes Relative: 7 %
Neutro Abs: 2.8 10*3/uL (ref 1.7–7.7)
Neutrophils Relative %: 72 %
Platelet Count: 121 10*3/uL — ABNORMAL LOW (ref 150–400)
RBC: 3.62 MIL/uL — ABNORMAL LOW (ref 3.87–5.11)
RDW: 16.5 % — ABNORMAL HIGH (ref 11.5–15.5)
WBC Count: 3.8 10*3/uL — ABNORMAL LOW (ref 4.0–10.5)
nRBC: 0 % (ref 0.0–0.2)

## 2020-07-07 LAB — CMP (CANCER CENTER ONLY)
ALT: 37 U/L (ref 0–44)
AST: 53 U/L — ABNORMAL HIGH (ref 15–41)
Albumin: 3.8 g/dL (ref 3.5–5.0)
Alkaline Phosphatase: 134 U/L — ABNORMAL HIGH (ref 38–126)
Anion gap: 10 (ref 5–15)
BUN: 11 mg/dL (ref 6–20)
CO2: 21 mmol/L — ABNORMAL LOW (ref 22–32)
Calcium: 9.1 mg/dL (ref 8.9–10.3)
Chloride: 105 mmol/L (ref 98–111)
Creatinine: 0.79 mg/dL (ref 0.44–1.00)
GFR, Estimated: >60 mL/min (ref >60)
Glucose, Bld: 254 mg/dL — ABNORMAL HIGH (ref 70–99)
Potassium: 4.1 mmol/L (ref 3.5–5.1)
Sodium: 136 mmol/L (ref 135–145)
Total Bilirubin: 0.5 mg/dL (ref 0.3–1.2)
Total Protein: 7.2 g/dL (ref 6.5–8.1)

## 2020-07-07 LAB — LACTATE DEHYDROGENASE: LDH: 147 U/L (ref 98–192)

## 2020-07-08 LAB — IGG, IGA, IGM
IgA: 129 mg/dL (ref 87–352)
IgG (Immunoglobin G), Serum: 548 mg/dL — ABNORMAL LOW (ref 586–1602)
IgM (Immunoglobulin M), Srm: 668 mg/dL — ABNORMAL HIGH (ref 26–217)

## 2020-07-08 LAB — KAPPA/LAMBDA LIGHT CHAINS
Kappa free light chain: 12.5 mg/L (ref 3.3–19.4)
Kappa, lambda light chain ratio: 0.01 — ABNORMAL LOW (ref 0.26–1.65)
Lambda free light chains: 1232.3 mg/L — ABNORMAL HIGH (ref 5.7–26.3)

## 2020-07-08 LAB — BETA 2 MICROGLOBULIN, SERUM: Beta-2 Microglobulin: 2 mg/L (ref 0.6–2.4)

## 2020-07-14 ENCOUNTER — Inpatient Hospital Stay: Payer: 59 | Attending: Internal Medicine | Admitting: Internal Medicine

## 2020-07-14 ENCOUNTER — Other Ambulatory Visit: Payer: Self-pay

## 2020-07-14 VITALS — BP 147/69 | HR 68 | Temp 97.8°F | Resp 19 | Ht 67.0 in | Wt 263.7 lb

## 2020-07-14 DIAGNOSIS — Z79899 Other long term (current) drug therapy: Secondary | ICD-10-CM | POA: Diagnosis not present

## 2020-07-14 DIAGNOSIS — D509 Iron deficiency anemia, unspecified: Secondary | ICD-10-CM | POA: Insufficient documentation

## 2020-07-14 DIAGNOSIS — Z5112 Encounter for antineoplastic immunotherapy: Secondary | ICD-10-CM | POA: Diagnosis present

## 2020-07-14 DIAGNOSIS — C9000 Multiple myeloma not having achieved remission: Secondary | ICD-10-CM

## 2020-07-14 DIAGNOSIS — Z5111 Encounter for antineoplastic chemotherapy: Secondary | ICD-10-CM | POA: Diagnosis not present

## 2020-07-14 DIAGNOSIS — R5383 Other fatigue: Secondary | ICD-10-CM | POA: Insufficient documentation

## 2020-07-14 MED ORDER — LENALIDOMIDE 25 MG PO CAPS: ORAL_CAPSULE | ORAL | 0 refills | Status: DC

## 2020-07-14 MED ORDER — DEXAMETHASONE 4 MG PO TABS: ORAL_TABLET | ORAL | 2 refills | Status: DC

## 2020-07-14 MED ORDER — INTEGRA PLUS PO CAPS: 1.0000 | ORAL_CAPSULE | Freq: Every day | ORAL | 2 refills | Status: DC

## 2020-07-14 MED ORDER — ACYCLOVIR 400 MG PO TABS: 400.0000 mg | ORAL_TABLET | Freq: Two times a day (BID) | ORAL | 1 refills | Status: DC

## 2020-07-14 NOTE — Progress Notes (Signed)
[place] [phone]   [phone]  OFFICE PROGRESS NOTE  Brenda Dickens, MD [place] [address]  DIAGNOSIS: Plasma cell dyscrasia initially diagnosed as MGUS in September 2010, with additional symptoms suggestive of POEMS syndrome.   PRIOR THERAPY:  1) Velcade 1.3 MG/M2 subcutaneously with Decadron 40 mg by mouth on a weekly basis. First cycle 11/24/2013. She status post 31 weekly doses of treatment. 2) Velcade 1.3 MG/M2 subcutaneously and weekly basis with Decadron 40 mg by mouth weekly. First dose 02/01/2015. Status post 28 cycles. 3) Revlimid 25 mg by mouth daily for 21 days every 4 weeks with weekly Decadron 20 mg. started in 11/27/2015. Status post 3 cycles discontinued secondary to lack of response.  CURRENT THERAPY: Systemic treatment with Velcade 1.3 MG/KG weekly, Revlimid 25 mg by mouth daily for 21 days every 4 weeks in addition to Decadron 20 mg by mouth weekly. First dose 03/06/2016. Status post 32 cycles.  She has a break off treatment from June 2021 until July 2022.  INTERVAL HISTORY: Brenda Conner 60 y.o. female returns to the clinic today for follow-up visit.  The patient is feeling fine today with no concerning complaints except for mild fatigue.  She denied having any current chest pain, shortness of breath, cough or hemoptysis.  She denied having any fever or chills.  She has no nausea, vomiting, diarrhea or constipation.  She has no headache or visual changes.  She has no recent weight loss or night sweats.  She has been off treatment for more than 1 year now.  She had repeat myeloma panel performed recently and she is here for evaluation and discussion of her lab results and treatment options.  MEDICAL HISTORY: Past Medical History:  Diagnosis Date   Acid reflux    Anxiety    Asthma    Cancer ([facility])    waldenstroms/ macroglobinulemia   Depression    Depression    Diabetes mellitus without  complication ([facility])    Dysuria 02/28/2016   Hypercholesteremia    Hypertension    Hypothyroidism    Macroglobulinemia ([facility])    ? POEMS syndrome   Multiple myeloma ([facility])    Obesity    PONV (postoperative nausea and vomiting)    Sleep apnea    CPAP at bedtime   Sleep apnea     ALLERGIES:  is allergic to codeine, hydrocodone, lortab [hydrocodone-acetaminophen], onion, shellfish allergy, and amoxicillin.  MEDICATIONS:  Current Outpatient Medications  Medication Sig Dispense Refill   ACCU-CHEK AVIVA PLUS test strip USE TO CHECK SUGAR TWICE A DAY 90     acyclovir (ZOVIRAX) 400 MG tablet Take 1 tablet (400 mg total) by mouth 2 (two) times daily. (Patient not taking: Reported on 01/20/2020) 180 tablet 1   ALPRAZolam (XANAX) 0.5 MG tablet Take 1 tablet (0.5 mg total) by mouth at bedtime as needed for anxiety. 30 tablet 1   Azelastine HCl 0.15 % SOLN as needed.     B-D UF III MINI PEN NEEDLES 31G X 5 MM MISC      Blood Glucose Monitoring Suppl (ONE TOUCH ULTRA MINI) W/DEVICE KIT See admin instructions. Reported on 01/25/2015  0   Cetirizine HCl (ZYRTEC ALLERGY PO) Take by mouth daily.     DiphenhydrAMINE HCl (BENADRYL ALLERGY PO) Take by mouth as needed.     EPIPEN 2-PAK 0.3 MG/0.3ML SOAJ injection Reported on 06/21/2015  1   FeFum-FePoly-FA-B Cmp-C-Biot (INTEGRA  PLUS) CAPS Take 1 capsule by mouth daily. 30 capsule 2   Fexofenadine HCl (ALLEGRA ALLERGY PO) Take by mouth daily.     Fluticasone-Salmeterol (ADVAIR) 100-50 MCG/DOSE AEPB Inhale 2 puffs into the lungs every 12 (twelve) hours.     ibuprofen (ADVIL,MOTRIN) 100 MG tablet Take 100 mg by mouth every 6 (six) hours as needed. Reported on 05/31/2015     Insulin Glargine (BASAGLAR KWIKPEN) 100 UNIT/ML Inject 20 Units into the skin at bedtime.     lenalidomide (REVLIMID) 25 MG capsule TAKE 1 CAPSULE BY MOUTH ONCE DAILY 21 DAYS ON AND 7 DAYS OFF (Patient not taking: Reported on 01/20/2020) 21 capsule 0   levothyroxine (SYNTHROID, LEVOTHROID) 150  MCG tablet Take 150 mcg by mouth daily before breakfast.     lisinopril (PRINIVIL,ZESTRIL) 10 MG tablet Take 10 mg by mouth daily. auth number 05/29/2018 [id number]  3   loperamide (IMODIUM) 2 MG capsule Take 2 mg by mouth as needed for diarrhea or loose stools.     magic mouthwash w/lidocaine SOLN Take 5 mLs by mouth 4 (four) times daily as needed for mouth pain. Swish, Gargle, and spit 240 mL 1   metFORMIN (GLUCOPHAGE-XR) 500 MG 24 hr tablet Take 500 mg by mouth daily.     NOVOLOG FLEXPEN 100 UNIT/ML FlexPen Inject 15 Units into the skin in the morning, at noon, and at bedtime. Per Sliding Scale     omeprazole (PRILOSEC) 40 MG capsule Take 40 mg by mouth 2 (two) times daily.      ondansetron (ZOFRAN-ODT) 8 MG disintegrating tablet Take 1 tablet (8 mg total) by mouth every 8 (eight) hours as needed. Reported on 05/31/2015 30 tablet 2   potassium chloride SA (KLOR-CON) 20 MEQ tablet Take 2 tablets (40 mEq total) by mouth 2 (two) times daily. 14 tablet 0   pravastatin (PRAVACHOL) 40 MG tablet Take 40 mg by mouth every evening.      PROAIR HFA 108 (90 Base) MCG/ACT inhaler Reported on 06/21/2015  1   sertraline (ZOLOFT) 50 MG tablet TAKE 3 TABLETS BY MOUTH EVERY DAY 270 tablet 3   verapamil (CALAN-SR) 240 MG CR tablet Take 240 mg by mouth 2 (two) times daily.     warfarin (COUMADIN) 2 MG tablet Take 1 tablet (2 mg total) by mouth daily. 90 tablet 0   No current facility-administered medications for this visit.    SURGICAL HISTORY:  Past Surgical History:  Procedure Laterality Date   ABLATION  09/2007   HTA and polyp resection   BACK SURGERY  03/2004   herniation, L4-L5   Bil Laprascopic knee surgery     BONE MARROW BIOPSY     2011   BONE MARROW BIOPSY  4/14   CHOLECYSTECTOMY     FOOT SURGERY  1998   KNEE ARTHROSCOPY W/ MENISCAL REPAIR  10/10 ; 3/11   LAPAROSCOPIC CHOLECYSTECTOMY  1997   NASAL SINUS SURGERY  2004   UTERINE FIBROID EMBOLIZATION      REVIEW OF SYSTEMS:  Constitutional:  positive for fatigue Eyes: negative Ears, nose, mouth, throat, and face: negative Respiratory: negative Cardiovascular: negative Gastrointestinal: negative Genitourinary:negative Integument/breast: negative Hematologic/lymphatic: negative Musculoskeletal:negative Neurological: negative Behavioral/Psych: negative Endocrine: negative Allergic/Immunologic: negative   PHYSICAL EXAMINATION: General appearance: alert, cooperative, fatigued, and no distress Head: Normocephalic, without obvious abnormality, atraumatic Neck: no adenopathy Lymph nodes: Cervical, supraclavicular, and axillary nodes normal. Resp: clear to auscultation bilaterally Back: symmetric, no curvature. ROM normal. No CVA tenderness. Cardio: regular rate and rhythm, S1,  S2 normal, no murmur, click, rub or gallop GI: soft, non-tender; bowel sounds normal; no masses,  no organomegaly Extremities: extremities normal, atraumatic, no cyanosis or edema Neurologic: Alert and oriented X 3, normal strength and tone. Normal symmetric reflexes. Normal coordination and gait  ECOG PERFORMANCE STATUS: 1 - Symptomatic but completely ambulatory  Blood pressure (!) 147/69, pulse 68, temperature 97.8 F (36.6 C), temperature source Tympanic, resp. rate 19, height _0  (1.702 m), weight 263 lb 11.2 oz (119.6 kg), SpO2 100 %.  LABORATORY DATA: Lab Results  Component Value Date   WBC 3.8 (L) 07/07/2020   HGB 10.3 (L) 07/07/2020   HCT 31.4 (L) 07/07/2020   MCV 86.7 07/07/2020   PLT 121 (L) 07/07/2020      Chemistry      Component Value Date/Time   NA 136 07/07/2020 1048   NA 138 11/14/2016 0824   K 4.1 07/07/2020 1048   K 4.3 11/14/2016 0824   CL 105 07/07/2020 1048   CL 104 04/07/2012 1415   CO2 21 (L) 07/07/2020 1048   CO2 23 11/14/2016 0824   BUN 11 07/07/2020 1048   BUN 9.1 11/14/2016 0824   CREATININE 0.79 07/07/2020 1048   CREATININE 0.7 11/14/2016 0824      Component Value Date/Time   CALCIUM 9.1 07/07/2020  1048   CALCIUM 9.6 11/14/2016 0824   ALKPHOS 134 (H) 07/07/2020 1048   ALKPHOS 94 11/14/2016 0824   AST 53 (H) 07/07/2020 1048   AST 28 11/14/2016 0824   ALT 37 07/07/2020 1048   ALT 24 11/14/2016 0824   BILITOT 0.5 07/07/2020 1048   BILITOT 0.41 11/14/2016 0824      ASSESSMENT AND PLAN:  This is a very pleasant 60 years old white female with smoldering multiple myeloma with questionable POEMS syndrome symptom.  The patient has been on treatment with subcutaneous weekly Velcade as well as Revlimid and Decadron status post 34 cycles.  She has been tolerating this treatment well. The patient has been on observation for the last several months and she has been doing fine with no concerning issues except for the recent upper respiratory infection and viral gastroenteritis. Her myeloma panel showed continuous increase in her free lambda light chain. The patient had a bone marrow biopsy and aspirate recently that showed 3-5% plasma cells still suspicious for plasma cell dyscrasia.  The skeletal bone survey was negative for any lytic lesions. The patient has been off treatment for more than a year between June 2021 until July 2022. She had repeat myeloma panel performed recently that showed significant worsening and increase of her lambda light chain.  She continues to have anemia but no renal insufficiency or hypercalcemia. I had a lengthy discussion with the patient about her condition and treatment options.  She was given the option of continuous observation and monitoring versus resuming her treatment with Velcade, Revlimid and Decadron. The patient would like to proceed with the treatment and she is expected to start the first dose of this treatment on July 19, 2020. I will send prescription for Revlimid, Decadron and acyclovir to her pharmacy. For the iron deficiency anemia, I will give her a refill of Integra +1 capsule p.o. daily. The patient will come back for follow-up visit in 4 weeks  with the start of cycle #2. She was advised to call immediately if she has any concerning symptoms in the interval. All questions were answered. The patient knows to call the clinic with any problems, questions or concerns.  We can certainly see the patient much sooner if necessary.  Disclaimer: This note was dictated with voice recognition software. Similar sounding words can inadvertently be transcribed and may not be corrected upon review.

## 2020-07-15 ENCOUNTER — Encounter: Payer: Self-pay | Admitting: Internal Medicine

## 2020-07-15 ENCOUNTER — Telehealth: Payer: Self-pay | Admitting: Internal Medicine

## 2020-07-15 NOTE — Telephone Encounter (Signed)
Scheduled per los. Called and left msg. Mailed printout. Detailed in msg that I will call back about next weeks infusion upon approval of chg nurse

## 2020-07-18 ENCOUNTER — Telehealth: Payer: Self-pay | Admitting: Medical Oncology

## 2020-07-18 ENCOUNTER — Encounter: Payer: Self-pay | Admitting: Internal Medicine

## 2020-07-18 NOTE — Telephone Encounter (Signed)
I tired to re-enroll pt as a "new pt "under RISK program because they have not filled her revlimid in one year.  Unable to process her enrollment.  Will call in am.

## 2020-07-19 ENCOUNTER — Other Ambulatory Visit: Payer: Self-pay

## 2020-07-19 ENCOUNTER — Ambulatory Visit: Payer: 59

## 2020-07-19 DIAGNOSIS — C9000 Multiple myeloma not having achieved remission: Secondary | ICD-10-CM

## 2020-07-19 MED ORDER — LENALIDOMIDE 25 MG PO CAPS: ORAL_CAPSULE | ORAL | 0 refills | Status: DC

## 2020-07-20 ENCOUNTER — Encounter: Payer: Self-pay | Admitting: Internal Medicine

## 2020-07-25 ENCOUNTER — Other Ambulatory Visit: Payer: Self-pay

## 2020-07-25 DIAGNOSIS — C9000 Multiple myeloma not having achieved remission: Secondary | ICD-10-CM

## 2020-07-26 ENCOUNTER — Other Ambulatory Visit: Payer: Self-pay

## 2020-07-26 ENCOUNTER — Inpatient Hospital Stay: Payer: 59

## 2020-07-26 ENCOUNTER — Other Ambulatory Visit: Payer: Self-pay | Admitting: Internal Medicine

## 2020-07-26 VITALS — BP 122/71 | HR 72 | Temp 98.1°F | Resp 18 | Wt 262.8 lb

## 2020-07-26 DIAGNOSIS — Z5112 Encounter for antineoplastic immunotherapy: Secondary | ICD-10-CM | POA: Diagnosis not present

## 2020-07-26 DIAGNOSIS — C9000 Multiple myeloma not having achieved remission: Secondary | ICD-10-CM

## 2020-07-26 LAB — CBC WITH DIFFERENTIAL (CANCER CENTER ONLY)
Abs Immature Granulocytes: 0.02 10*3/uL (ref 0.00–0.07)
Basophils Absolute: 0 10*3/uL (ref 0.0–0.1)
Basophils Relative: 1 %
Eosinophils Absolute: 0.1 10*3/uL (ref 0.0–0.5)
Eosinophils Relative: 2 %
HCT: 36.1 % (ref 36.0–46.0)
Hemoglobin: 11.8 g/dL — ABNORMAL LOW (ref 12.0–15.0)
Immature Granulocytes: 0 %
Lymphocytes Relative: 19 %
Lymphs Abs: 0.9 10*3/uL (ref 0.7–4.0)
MCH: 28.6 pg (ref 26.0–34.0)
MCHC: 32.7 g/dL (ref 30.0–36.0)
MCV: 87.4 fL (ref 80.0–100.0)
Monocytes Absolute: 0.3 10*3/uL (ref 0.1–1.0)
Monocytes Relative: 7 %
Neutro Abs: 3.5 10*3/uL (ref 1.7–7.7)
Neutrophils Relative %: 71 %
Platelet Count: 122 10*3/uL — ABNORMAL LOW (ref 150–400)
RBC: 4.13 MIL/uL (ref 3.87–5.11)
RDW: 15 % (ref 11.5–15.5)
WBC Count: 4.9 10*3/uL (ref 4.0–10.5)
nRBC: 0 % (ref 0.0–0.2)

## 2020-07-26 LAB — CMP (CANCER CENTER ONLY)
ALT: 46 U/L — ABNORMAL HIGH (ref 0–44)
AST: 57 U/L — ABNORMAL HIGH (ref 15–41)
Albumin: 3.9 g/dL (ref 3.5–5.0)
Alkaline Phosphatase: 140 U/L — ABNORMAL HIGH (ref 38–126)
Anion gap: 12 (ref 5–15)
BUN: 8 mg/dL (ref 6–20)
CO2: 23 mmol/L (ref 22–32)
Calcium: 9.8 mg/dL (ref 8.9–10.3)
Chloride: 103 mmol/L (ref 98–111)
Creatinine: 0.86 mg/dL (ref 0.44–1.00)
GFR, Estimated: >60 mL/min (ref >60)
Glucose, Bld: 227 mg/dL — ABNORMAL HIGH (ref 70–99)
Potassium: 3.9 mmol/L (ref 3.5–5.1)
Sodium: 138 mmol/L (ref 135–145)
Total Bilirubin: 0.4 mg/dL (ref 0.3–1.2)
Total Protein: 7.7 g/dL (ref 6.5–8.1)

## 2020-07-26 MED ORDER — ONDANSETRON HCL 8 MG PO TABS
ORAL_TABLET | ORAL | Status: AC
  Filled 2020-07-26: qty 1

## 2020-07-26 MED ORDER — ONDANSETRON HCL 8 MG PO TABS
8.0000 mg | ORAL_TABLET | Freq: Once | ORAL | Status: AC
  Administered 2020-07-26: 8 mg via ORAL

## 2020-07-26 MED ORDER — BORTEZOMIB CHEMO SQ INJECTION 3.5 MG (2.5MG/ML)
1.3000 mg/m2 | Freq: Once | INTRAMUSCULAR | Status: AC
  Administered 2020-07-26: 3 mg via SUBCUTANEOUS
  Filled 2020-07-26: qty 1.2

## 2020-07-26 NOTE — Telephone Encounter (Signed)
Refilled 1 week ago

## 2020-07-26 NOTE — Patient Instructions (Signed)
[facility]  Discharge Instructions: Thank you for choosing [facility] to provide your oncology and hematology care.   If you have a lab appointment with the Cancer Center, please go directly to the Cancer Center and check in at the registration area.   Wear comfortable clothing and clothing appropriate for easy access to any Portacath or PICC line.   We strive to give you quality time with your provider. You may need to reschedule your appointment if you arrive late (15 or more minutes).  Arriving late affects you and other patients whose appointments are after yours.  Also, if you miss three or more appointments without notifying the office, you may be dismissed from the clinic at the provider's discretion.      For prescription refill requests, have your pharmacy contact our office and allow 72 hours for refills to be completed.    Today you received the following chemotherapy and/or immunotherapy agents Bortezomib (Velcade Injection)    To help prevent nausea and vomiting after your treatment, we encourage you to take your nausea medication as directed.  BELOW ARE SYMPTOMS THAT SHOULD BE REPORTED IMMEDIATELY: *FEVER GREATER THAN 100.4 F (38 C) OR HIGHER *CHILLS OR SWEATING *NAUSEA AND VOMITING THAT IS NOT CONTROLLED WITH YOUR NAUSEA MEDICATION *UNUSUAL SHORTNESS OF BREATH *UNUSUAL BRUISING OR BLEEDING *URINARY PROBLEMS (pain or burning when urinating, or frequent urination) *BOWEL PROBLEMS (unusual diarrhea, constipation, pain near the anus) TENDERNESS IN MOUTH AND THROAT WITH OR WITHOUT PRESENCE OF ULCERS (sore throat, sores in mouth, or a toothache) UNUSUAL RASH, SWELLING OR PAIN  UNUSUAL VAGINAL DISCHARGE OR ITCHING   Items with * indicate a potential emergency and should be followed up as soon as possible or go to the Emergency Department if any problems should occur.  Please show the CHEMOTHERAPY ALERT CARD or IMMUNOTHERAPY ALERT  CARD at check-in to the Emergency Department and triage nurse.  Should you have questions after your visit or need to cancel or reschedule your appointment, please contact [facility]  Dept: [phone]  and follow the prompts.  Office hours are 8:00 a.m. to 4:30 p.m. Monday - Friday. Please note that voicemails left after 4:00 p.m. may not be returned until the following business day.  We are closed weekends and major holidays. You have access to a nurse at all times for urgent questions. Please call the main number to the clinic Dept: [phone] and follow the prompts.   For any non-urgent questions, you may also contact your provider using MyChart. We now offer e-Visits for anyone 18 and older to request care online for non-urgent symptoms. For details visit [email or web address].   Also download the MyChart app! Go to the app store, search "MyChart", open the app, select [facility], and log in with your MyChart username and password.  Due to Covid, a mask is required upon entering the hospital/clinic. If you do not have a mask, one will be given to you upon arrival. For doctor visits, patients may have 1 support person aged 18 or older with them. For treatment visits, patients cannot have anyone with them due to current Covid guidelines and our immunocompromised population.   

## 2020-08-02 ENCOUNTER — Other Ambulatory Visit: Payer: Self-pay

## 2020-08-02 ENCOUNTER — Inpatient Hospital Stay: Payer: 59

## 2020-08-02 VITALS — BP 149/97 | HR 73 | Temp 98.3°F | Resp 17 | Wt 262.6 lb

## 2020-08-02 DIAGNOSIS — C9000 Multiple myeloma not having achieved remission: Secondary | ICD-10-CM

## 2020-08-02 DIAGNOSIS — Z5112 Encounter for antineoplastic immunotherapy: Secondary | ICD-10-CM | POA: Diagnosis not present

## 2020-08-02 LAB — CBC WITH DIFFERENTIAL (CANCER CENTER ONLY)
Abs Immature Granulocytes: 0.02 10*3/uL (ref 0.00–0.07)
Basophils Absolute: 0 10*3/uL (ref 0.0–0.1)
Basophils Relative: 0 %
Eosinophils Absolute: 0.1 10*3/uL (ref 0.0–0.5)
Eosinophils Relative: 3 %
HCT: 34.9 % — ABNORMAL LOW (ref 36.0–46.0)
Hemoglobin: 11.5 g/dL — ABNORMAL LOW (ref 12.0–15.0)
Immature Granulocytes: 0 %
Lymphocytes Relative: 17 %
Lymphs Abs: 0.8 10*3/uL (ref 0.7–4.0)
MCH: 28.8 pg (ref 26.0–34.0)
MCHC: 33 g/dL (ref 30.0–36.0)
MCV: 87.5 fL (ref 80.0–100.0)
Monocytes Absolute: 0.3 10*3/uL (ref 0.1–1.0)
Monocytes Relative: 6 %
Neutro Abs: 3.4 10*3/uL (ref 1.7–7.7)
Neutrophils Relative %: 74 %
Platelet Count: 122 10*3/uL — ABNORMAL LOW (ref 150–400)
RBC: 3.99 MIL/uL (ref 3.87–5.11)
RDW: 15 % (ref 11.5–15.5)
WBC Count: 4.6 10*3/uL (ref 4.0–10.5)
nRBC: 0 % (ref 0.0–0.2)

## 2020-08-02 LAB — CMP (CANCER CENTER ONLY)
ALT: 53 U/L — ABNORMAL HIGH (ref 0–44)
AST: 45 U/L — ABNORMAL HIGH (ref 15–41)
Albumin: 3.7 g/dL (ref 3.5–5.0)
Alkaline Phosphatase: 134 U/L — ABNORMAL HIGH (ref 38–126)
Anion gap: 11 (ref 5–15)
BUN: 8 mg/dL (ref 6–20)
CO2: 22 mmol/L (ref 22–32)
Calcium: 9.6 mg/dL (ref 8.9–10.3)
Chloride: 104 mmol/L (ref 98–111)
Creatinine: 0.81 mg/dL (ref 0.44–1.00)
GFR, Estimated: >60 mL/min (ref >60)
Glucose, Bld: 211 mg/dL — ABNORMAL HIGH (ref 70–99)
Potassium: 3.8 mmol/L (ref 3.5–5.1)
Sodium: 137 mmol/L (ref 135–145)
Total Bilirubin: 0.5 mg/dL (ref 0.3–1.2)
Total Protein: 7.2 g/dL (ref 6.5–8.1)

## 2020-08-02 MED ORDER — ONDANSETRON HCL 8 MG PO TABS
8.0000 mg | ORAL_TABLET | Freq: Once | ORAL | Status: AC
  Administered 2020-08-02: 8 mg via ORAL

## 2020-08-02 MED ORDER — BORTEZOMIB CHEMO SQ INJECTION 3.5 MG (2.5MG/ML)
1.3000 mg/m2 | Freq: Once | INTRAMUSCULAR | Status: AC
  Administered 2020-08-02: 3 mg via SUBCUTANEOUS
  Filled 2020-08-02: qty 1.2

## 2020-08-02 MED ORDER — ONDANSETRON HCL 8 MG PO TABS
ORAL_TABLET | ORAL | Status: AC
  Filled 2020-08-02: qty 1

## 2020-08-02 NOTE — Patient Instructions (Signed)
[facility]  Discharge Instructions: Thank you for choosing [facility] to provide your oncology and hematology care.   If you have a lab appointment with the [facility], please go directly to the [facility] and check in at the registration area.   Wear comfortable clothing and clothing appropriate for easy access to any Portacath or PICC line.   We strive to give you quality time with your provider. You may need to reschedule your appointment if you arrive late (15 or more minutes).  Arriving late affects you and other patients whose appointments are after yours.  Also, if you miss three or more appointments without notifying the office, you may be dismissed from the clinic at the provider's discretion.      For prescription refill requests, have your pharmacy contact our office and allow 72 hours for refills to be completed.    Today you received the following chemotherapy and/or immunotherapy agents Bortezomib (Velcade) SubQ Injection (Rt lower Quadrant).      To help prevent nausea and vomiting after your treatment, we encourage you to take your nausea medication as directed.  BELOW ARE SYMPTOMS THAT SHOULD BE REPORTED IMMEDIATELY: *FEVER GREATER THAN 100.4 F (38 C) OR HIGHER *CHILLS OR SWEATING *NAUSEA AND VOMITING THAT IS NOT CONTROLLED WITH YOUR NAUSEA MEDICATION *UNUSUAL SHORTNESS OF BREATH *UNUSUAL BRUISING OR BLEEDING *URINARY PROBLEMS (pain or burning when urinating, or frequent urination) *BOWEL PROBLEMS (unusual diarrhea, constipation, pain near the anus) TENDERNESS IN MOUTH AND THROAT WITH OR WITHOUT PRESENCE OF ULCERS (sore throat, sores in mouth, or a toothache) UNUSUAL RASH, SWELLING OR PAIN  UNUSUAL VAGINAL DISCHARGE OR ITCHING   Items with * indicate a potential emergency and should be followed up as soon as possible or go to the Emergency Department if any problems should occur.  Please show the CHEMOTHERAPY ALERT  CARD or IMMUNOTHERAPY ALERT CARD at check-in to the Emergency Department and triage nurse.  Should you have questions after your visit or need to cancel or reschedule your appointment, please contact [facility]  Dept: [phone]  and follow the prompts.  Office hours are 8:00 a.m. to 4:30 p.m. Monday - Friday. Please note that voicemails left after 4:00 p.m. may not be returned until the following business day.  We are closed weekends and major holidays. You have access to a nurse at all times for urgent questions. Please call the main number to the clinic Dept: [phone] and follow the prompts.   For any non-urgent questions, you may also contact your provider using MyChart. We now offer e-Visits for anyone 60 and older to request care online for non-urgent symptoms. For details visit [email or web address].   Also download the MyChart app! Go to the app store, search "MyChart", open the app, select [facility], and log in with your MyChart username and password.  Due to Covid, a mask is required upon entering the hospital/clinic. If you do not have a mask, one will be given to you upon arrival. For doctor visits, patients may have 1 support person aged 60 or older with them. For treatment visits, patients cannot have anyone with them due to current Covid guidelines and our immunocompromised population.

## 2020-08-02 NOTE — Progress Notes (Addendum)
C107 D1 of Bortezomib Injection administered in the RLQ.  Bandaid applied to site.  Pt tolerated the procedure well.

## 2020-08-03 ENCOUNTER — Ambulatory Visit (INDEPENDENT_AMBULATORY_CARE_PROVIDER_SITE_OTHER): Payer: 59 | Admitting: Obstetrics & Gynecology

## 2020-08-03 ENCOUNTER — Encounter (HOSPITAL_BASED_OUTPATIENT_CLINIC_OR_DEPARTMENT_OTHER): Payer: Self-pay | Admitting: Obstetrics & Gynecology

## 2020-08-03 ENCOUNTER — Other Ambulatory Visit (HOSPITAL_COMMUNITY)
Admission: RE | Admit: 2020-08-03 | Discharge: 2020-08-03 | Disposition: A | Discharge status: Home / Self Care | Payer: 59 | Source: Ambulatory Visit | Attending: Obstetrics & Gynecology | Admitting: Obstetrics & Gynecology

## 2020-08-03 VITALS — BP 123/54 | HR 67 | Ht 66.5 in | Wt 264.6 lb

## 2020-08-03 DIAGNOSIS — Z780 Asymptomatic menopausal state: Secondary | ICD-10-CM

## 2020-08-03 DIAGNOSIS — Z9889 Other specified postprocedural states: Secondary | ICD-10-CM

## 2020-08-03 DIAGNOSIS — Z01419 Encounter for gynecological examination (general) (routine) without abnormal findings: Secondary | ICD-10-CM

## 2020-08-03 DIAGNOSIS — D508 Other iron deficiency anemias: Secondary | ICD-10-CM

## 2020-08-03 DIAGNOSIS — F419 Anxiety disorder, unspecified: Secondary | ICD-10-CM | POA: Diagnosis not present

## 2020-08-03 DIAGNOSIS — Z124 Encounter for screening for malignant neoplasm of cervix: Secondary | ICD-10-CM

## 2020-08-03 DIAGNOSIS — C9000 Multiple myeloma not having achieved remission: Secondary | ICD-10-CM

## 2020-08-03 MED ORDER — ALPRAZOLAM 0.5 MG PO TABS: 0.5000 mg | ORAL_TABLET | Freq: Every evening | ORAL | 1 refills | Status: DC | PRN

## 2020-08-03 MED ORDER — SERTRALINE HCL 50 MG PO TABS: 150.0000 mg | ORAL_TABLET | Freq: Every day | ORAL | 3 refills | Status: DC

## 2020-08-03 NOTE — Progress Notes (Signed)
60 y.o. G0P0000 Single White or Caucasian female here for annual exam.  Denies vaginal bleeding.    She went to [place] for her birthday in March.  Wanted to see the [place].  Saw them when they were flying into [place].  Michela Pitcher it was magical.    Has multiple myeloma.  Lambda light chain levels increased after being off chemo for more than a year.  Has restarted chemotherapy.  Followed by Dr. Julien Nordmann.  Did have dexamethasone prior to injection and is having some swelling today.    Did have some anemia earlier this year.  Dr. Julien Nordmann started her on iron.  Hb yesterday was 11.5.    No LMP recorded. Patient has had an ablation.          Sexually active: No.  The current method of family planning is post menopausal status.    Exercising: No.  Smoker:  no  Health Maintenance: Pap:  08/19/17 neg History of abnormal Pap:  no MMG:  11/21/18 neg Cologuard:  01/21/19 neg BMD:   09/19/16 normal TDaP:  12/09/14 Pneumonia vaccine(s):  not done Shingrix:   01/07/19, 03/11/19 Hep C testing: pt is sure she's had this done Screening Labs: Dr. Marily Memos, at [facility]   reports that she has never smoked. She has never used smokeless tobacco. She reports that she does not drink alcohol and does not use drugs.  Past Medical History:  Diagnosis Date   Acid reflux    Anxiety    Asthma    Cancer ([place])    waldenstroms/ macroglobinulemia   Depression    Depression    Diabetes mellitus without complication ([place])    Dysuria 02/28/2016   Hypercholesteremia    Hypertension    Hypothyroidism    Macroglobulinemia ([place])    ? POEMS syndrome   Multiple myeloma (HCC)    Obesity    PONV (postoperative nausea and vomiting)    Sleep apnea    CPAP at bedtime   Sleep apnea     Past Surgical History:  Procedure Laterality Date   ABLATION  09/09/2007   HTA and polyp resection   BACK SURGERY  03/08/2004   herniation, L4-L5   Bil Laprascopic knee surgery     BONE MARROW BIOPSY     2011   BONE MARROW  BIOPSY  04/08/2012   BONE MARROW BIOPSY  01/2020   CHOLECYSTECTOMY     FOOT SURGERY  01/09/1996   KNEE ARTHROSCOPY W/ MENISCAL REPAIR  10/10 ; 3/11   LAPAROSCOPIC CHOLECYSTECTOMY  01/09/1995   NASAL SINUS SURGERY  01/08/2002   UTERINE FIBROID EMBOLIZATION      Current Outpatient Medications  Medication Sig Dispense Refill   ACCU-CHEK AVIVA PLUS test strip USE TO CHECK SUGAR TWICE A DAY 90     acyclovir (ZOVIRAX) 400 MG tablet Take 1 tablet (400 mg total) by mouth 2 (two) times daily. 180 tablet 1   ALPRAZolam (XANAX) 0.5 MG tablet Take 1 tablet (0.5 mg total) by mouth at bedtime as needed for anxiety. 30 tablet 1   aspirin 81 MG chewable tablet Chew by mouth daily.     Azelastine HCl 0.15 % SOLN as needed.     B-D UF III MINI PEN NEEDLES 31G X 5 MM MISC      Blood Glucose Monitoring Suppl (ONE TOUCH ULTRA MINI) W/DEVICE KIT See admin instructions. Reported on 01/25/2015  0   bortezomib IV (VELCADE) 3.5 MG injection Inject 1.3 mg/m2 into the vein once.  Cetirizine HCl (ZYRTEC ALLERGY PO) Take by mouth daily.     dexamethasone (DECADRON) 4 MG tablet 5 tablets p.o. weekly on the day of chemotherapy. 40 tablet 2   DiphenhydrAMINE HCl (BENADRYL ALLERGY PO) Take by mouth as needed.     EPIPEN 2-PAK 0.3 MG/0.3ML SOAJ injection Reported on 06/21/2015  1   FeFum-FePoly-FA-B Cmp-C-Biot (INTEGRA PLUS) CAPS Take 1 capsule by mouth daily. 30 capsule 2   Fexofenadine HCl (ALLEGRA ALLERGY PO) Take by mouth daily.     Fluticasone-Salmeterol (ADVAIR) 100-50 MCG/DOSE AEPB Inhale 2 puffs into the lungs every 12 (twelve) hours.     ibuprofen (ADVIL,MOTRIN) 100 MG tablet Take 100 mg by mouth every 6 (six) hours as needed. Reported on 05/31/2015     Insulin Glargine (BASAGLAR KWIKPEN) 100 UNIT/ML Inject 20 Units into the skin at bedtime.     lenalidomide (REVLIMID) 25 MG capsule TAKE 1 CAPSULE BY MOUTH ONCE DAILY 21 DAYS ON AND 7 DAYS OFF 21 capsule 0   levothyroxine (SYNTHROID, LEVOTHROID) 150 MCG tablet  Take 150 mcg by mouth daily before breakfast.     lisinopril (PRINIVIL,ZESTRIL) 10 MG tablet Take 10 mg by mouth daily. auth number 05/29/2018 [id number]  3   loperamide (IMODIUM) 2 MG capsule Take 2 mg by mouth as needed for diarrhea or loose stools.     magic mouthwash w/lidocaine SOLN Take 5 mLs by mouth 4 (four) times daily as needed for mouth pain. Swish, Gargle, and spit 240 mL 1   metFORMIN (GLUCOPHAGE-XR) 500 MG 24 hr tablet Take 500 mg by mouth daily.     NOVOLOG FLEXPEN 100 UNIT/ML FlexPen Inject 15 Units into the skin in the morning, at noon, and at bedtime. Per Sliding Scale     omeprazole (PRILOSEC) 40 MG capsule Take 40 mg by mouth 2 (two) times daily.      ondansetron (ZOFRAN-ODT) 8 MG disintegrating tablet Take 1 tablet (8 mg total) by mouth every 8 (eight) hours as needed. Reported on 05/31/2015 30 tablet 2   pravastatin (PRAVACHOL) 40 MG tablet Take 40 mg by mouth every evening.      PROAIR HFA 108 (90 Base) MCG/ACT inhaler Reported on 06/21/2015  1   sertraline (ZOLOFT) 50 MG tablet TAKE 3 TABLETS BY MOUTH EVERY DAY 270 tablet 3   verapamil (CALAN-SR) 240 MG CR tablet Take 240 mg by mouth 2 (two) times daily.     potassium chloride SA (KLOR-CON) 20 MEQ tablet Take 2 tablets (40 mEq total) by mouth 2 (two) times daily. (Patient not taking: Reported on 08/03/2020) 14 tablet 0   No current facility-administered medications for this visit.    Family History  Problem Relation Age of Onset   Stroke Mother    Heart disease Mother        heart cath   Asthma Mother    Endometriosis Mother     Review of Systems  Constitutional: Negative.   Gastrointestinal: Negative.   Genitourinary: Negative.   Psychiatric/Behavioral:         Anxiety   Exam:   BP (!) 123/54   Pulse 67   Ht 5' 6.5" (1.689 m)   Wt 264 lb 9.6 oz (120 kg)   BMI 42.07 kg/m   Height: 5' 6.5" (168.9 cm)  General appearance: alert, cooperative and appears stated age Head: Normocephalic, without obvious  abnormality, atraumatic Neck: no adenopathy, supple, symmetrical, trachea midline and thyroid normal to inspection and palpation Lungs: clear to auscultation bilaterally Breasts: normal appearance, no  masses or tenderness Heart: regular rate and rhythm Abdomen: soft, non-tender; bowel sounds normal; no masses,  no organomegaly Extremities: extremities normal, atraumatic, no cyanosis or edema Skin: Skin color, texture, turgor normal. No rashes or lesions Lymph nodes: Cervical, supraclavicular, and axillary nodes normal. No abnormal inguinal nodes palpated Neurologic: Grossly normal   Pelvic: External genitalia:  no lesions              Urethra:  normal appearing urethra with no masses, tenderness or lesions              Bartholins and Skenes: normal                 Vagina: normal appearing vagina with normal color and no discharge, no lesions              Cervix: no lesions              Pap taken: Yes.   Bimanual Exam:  Uterus:  normal size, contour, position, consistency, mobility, non-tender              Adnexa: normal adnexa and no mass, fullness, tenderness               Rectovaginal: Confirms               Anus:  normal sphincter tone, no lesions  Chaperone, Octaviano Batty, CMA, was present for exam.  Assessment/Plan: 1. Well woman exam with routine gynecological exam  1. Well woman exam with routine gynecological exam - pap and HR HPV obtained today - MMG results from 2021 requested - cologuard negative 2021 - plan BMD next year - screening lab work is done with provider at [facility] - vaccines updated  2. Postmenopausal - no HRT  3. Anxiety - ALPRAZolam (XANAX) 0.5 MG tablet; Take 1 tablet (0.5 mg total) by mouth at bedtime as needed for anxiety.  Dispense: 30 tablet; Refill: 1 - sertraline (ZOLOFT) 50 MG tablet; Take 3 tablets (150 mg total) by mouth daily.  Dispense: 270 tablet; Refill: 3  4.  History of endometrial ablation -  done 9/09  5. Other iron deficiency  anemia - on iron deficiency.  Hb yesterday was 11.5  6. Multiple myeloma not having achieved remission ([place])

## 2020-08-05 LAB — CYTOLOGY - PAP
Adequacy: ABSENT
Comment: NEGATIVE
Diagnosis: NEGATIVE
High risk HPV: NEGATIVE

## 2020-08-05 NOTE — Progress Notes (Signed)
[facility] OFFICE PROGRESS NOTE  [place]renda Dick[place], [place] [id number]  DIAGNOSIS: Plasma cell dyscrasia initially diagnosed as MGUS in September 2010, with additional symptoms suggestive of POEMS syndrome.  PRIOR THERAPY: 1) Velcade 1.3 MG/M2 subcutaneously with Decadron 40 mg by mouth on a weekly basis. First cycle 11/24/2013. She status post 31 weekly doses of treatment. 2) Velcade 1.3 MG/M2 subcutaneously and weekly basis with Decadron 40 mg by mouth weekly. First dose 02/01/2015. Status post 28 cycles. 3) Revlimid 25 mg by mouth daily for 21 days every 4 weeks with weekly Decadron 20 mg. started in 11/27/2015. Status post 3 cycles discontinued secondary to lack of response.  CURRENT THERAPY: Systemic treatment with Velcade 1.3 MG/KG weekly, Revlimid 25 mg by mouth daily for 21 days every 4 weeks in addition to Decadron 20 mg by mouth weekly. First dose 03/06/2016. Status post 32 cycles.  She has a break off treatment from June 2021 until July 2022. Resumed July 19, 2020. Status post 1 cycle.   INTERVAL HISTORY: Brenda Conner 60 y.o. female returns to the clinic today for a follow-up visit.  The patient is feeling well today.  She was recently found to have disease progression in July 2022.  Therefore, she restarted her treatment with Revlimid, Velcade, and Decadron.  She tolerated the first treatment well. She has been taking Integra plus as well for her anemia and needs a refill.  She denies any fever, chills, night sweats, or unexplained weight loss.  She denies any nausea, vomiting, diarrhea, or constipation.  She has stable peripheral neuropathy but denies it getting worse. She denies any abnormal bleeding or bruising.  She denies any signs and symptoms of infection.  The patient is here today for evaluation and repeat blood work before starting her next cycle of Velcade.  MEDICAL HISTORY: Past Medical History:  Diagnosis Date   Acid reflux     Anxiety    Asthma    Cancer ([place])    waldenstroms/ macroglobinulemia   Depression    Depression    Diabetes mellitus without complication ([place])    Dysuria 02/28/2016   Hypercholesteremia    Hypertension    Hypothyroidism    Macroglobulinemia ([place])    ? POEMS syndrome   Multiple myeloma (HCC)    Obesity    PONV (postoperative nausea and vomiting)    Sleep apnea    CPAP at bedtime   Sleep apnea     ALLERGIES:  is allergic to codeine, hydrocodone, lortab [hydrocodone-acetaminophen], onion, shellfish allergy, and amoxicillin.  MEDICATIONS:  Current Outpatient Medications  Medication Sig Dispense Refill   ACCU-CHEK AVIVA PLUS test strip USE TO CHECK SUGAR TWICE A DAY 90     acyclovir (ZOVIRAX) 400 MG tablet Take 1 tablet (400 mg total) by mouth 2 (two) times daily. 180 tablet 1   ALPRAZolam (XANAX) 0.5 MG tablet Take 1 tablet (0.5 mg total) by mouth at bedtime as needed for anxiety. 30 tablet 1   aspirin 81 MG chewable tablet Chew by mouth daily.     Azelastine HCl 0.15 % SOLN as needed.     B-D UF III MINI PEN NEEDLES 31G X 5 MM MISC      Blood Glucose Monitoring Suppl (ONE TOUCH ULTRA MINI) W/DEVICE KIT See admin instructions. Reported on 01/25/2015  0   bortezomib IV (VELCADE) 3.5 MG injection Inject 1.3 mg/m2 into the vein once.     Cetirizine HCl (ZYRTEC ALLERGY  PO) Take by mouth daily.     dexamethasone (DECADRON) 4 MG tablet 5 tablets p.o. weekly on the day of chemotherapy. 40 tablet 2   DiphenhydrAMINE HCl (BENADRYL ALLERGY PO) Take by mouth as needed.     Fexofenadine HCl (ALLEGRA ALLERGY PO) Take by mouth daily.     Fluticasone-Salmeterol (ADVAIR) 100-50 MCG/DOSE AEPB Inhale 2 puffs into the lungs every 12 (twelve) hours.     ibuprofen (ADVIL,MOTRIN) 100 MG tablet Take 100 mg by mouth every 6 (six) hours as needed. Reported on 05/31/2015     Insulin Glargine (BASAGLAR KWIKPEN) 100 UNIT/ML Inject 20 Units into the skin at bedtime.     lenalidomide (REVLIMID) 25 MG capsule  TAKE 1 CAPSULE BY MOUTH ONCE DAILY 21 DAYS ON AND 7 DAYS OFF 21 capsule 0   levothyroxine (SYNTHROID, LEVOTHROID) 150 MCG tablet Take 150 mcg by mouth daily before breakfast.     lisinopril (PRINIVIL,ZESTRIL) 10 MG tablet Take 10 mg by mouth daily. auth number 05/29/2018 [id number]  3   loperamide (IMODIUM) 2 MG capsule Take 2 mg by mouth as needed for diarrhea or loose stools.     magic mouthwash w/lidocaine SOLN Take 5 mLs by mouth 4 (four) times daily as needed for mouth pain. Swish, Gargle, and spit 240 mL 1   metFORMIN (GLUCOPHAGE-XR) 500 MG 24 hr tablet Take 500 mg by mouth daily.     NOVOLOG FLEXPEN 100 UNIT/ML FlexPen Inject 15 Units into the skin in the morning, at noon, and at bedtime. Per Sliding Scale     omeprazole (PRILOSEC) 40 MG capsule Take 40 mg by mouth 2 (two) times daily.      ondansetron (ZOFRAN-ODT) 8 MG disintegrating tablet Take 1 tablet (8 mg total) by mouth every 8 (eight) hours as needed. Reported on 05/31/2015 30 tablet 2   pravastatin (PRAVACHOL) 40 MG tablet Take 40 mg by mouth every evening.      PROAIR HFA 108 (90 Base) MCG/ACT inhaler Reported on 06/21/2015  1   sertraline (ZOLOFT) 50 MG tablet Take 3 tablets (150 mg total) by mouth daily. 270 tablet 3   verapamil (CALAN-SR) 240 MG CR tablet Take 240 mg by mouth 2 (two) times daily.     EPIPEN 2-PAK 0.3 MG/0.3ML SOAJ injection Reported on 06/21/2015 (Patient not taking: Reported on 08/09/2020)  1   FeFum-FePoly-FA-B Cmp-C-Biot (INTEGRA PLUS) CAPS Take 1 capsule by mouth daily. 30 capsule 2   potassium chloride SA (KLOR-CON) 20 MEQ tablet Take 2 tablets (40 mEq total) by mouth 2 (two) times daily. (Patient not taking: No sig reported) 14 tablet 0   No current facility-administered medications for this visit.   Facility-Administered Medications Ordered in Other Visits  Medication Dose Route Frequency Provider Last Rate Last Admin   bortezomib SQ (VELCADE) chemo injection (2.64m/mL concentration) 3 mg  1.3 mg/m2 (Treatment  Plan Recorded) Subcutaneous Once Brenda Bears MD        SURGICAL HISTORY:  Past Surgical History:  Procedure Laterality Date   ABLATION  09/09/2007   HTA and polyp resection   BACK SURGERY  03/08/2004   herniation, L4-L5   Bil Laprascopic knee surgery     BONE MARROW BIOPSY     2011   BONE MARROW BIOPSY  04/08/2012   BONE MARROW BIOPSY  01/2020   CHOLECYSTECTOMY     FOOT SURGERY  01/09/1996   KNEE ARTHROSCOPY W/ MENISCAL REPAIR  10/10 ; 3/11   LAPAROSCOPIC CHOLECYSTECTOMY  01/09/1995   NASAL SINUS SURGERY  01/08/2002   UTERINE FIBROID EMBOLIZATION      REVIEW OF SYSTEMS:   Review of Systems  Constitutional: Negative for appetite change, chills, fatigue, fever and unexpected weight change.  HENT: Negative for mouth sores, nosebleeds, sore throat and trouble swallowing.   Eyes: Negative for eye problems and icterus.  Respiratory: Negative for cough, hemoptysis, shortness of breath and wheezing.   Cardiovascular: Negative for chest pain and leg swelling.  Gastrointestinal: Negative for abdominal pain, constipation, diarrhea, nausea and vomiting.  Genitourinary: Negative for bladder incontinence, difficulty urinating, dysuria, frequency and hematuria.   Musculoskeletal: Negative for back pain, gait problem, neck pain and neck stiffness.  Skin: Negative for itching and rash.  Neurological: Negative for dizziness, extremity weakness, gait problem, headaches, light-headedness and seizures.  Hematological: Negative for adenopathy. Does not bruise/bleed easily.  Psychiatric/Behavioral: Negative for confusion, depression and sleep disturbance. The patient is not nervous/anxious.     PHYSICAL EXAMINATION:  Blood pressure 125/61, pulse 71, temperature 98.3 F (36.8 C), temperature source Tympanic, resp. rate 20, height 5' 6.5" (1.689 m), weight 263 lb 14.4 oz (119.7 kg), SpO2 100 %.  ECOG PERFORMANCE STATUS: 1  Physical Exam  Constitutional: Oriented to person, place, and time  and well-developed, well-nourished, and in no distress. No distress.  HENT:  Head: Normocephalic and atraumatic.  Mouth/Throat: Oropharynx is clear and moist. No oropharyngeal exudate.  Eyes: Conjunctivae are normal. Right eye exhibits no discharge. Left eye exhibits no discharge. No scleral icterus.  Neck: Normal range of motion. Neck supple.  Cardiovascular: Normal rate, regular rhythm, normal heart sounds and intact distal pulses.   Pulmonary/Chest: Effort normal and breath sounds normal. No respiratory distress. No wheezes. No rales.  Abdominal: Soft. Bowel sounds are normal. Exhibits no distension and no mass. There is no tenderness.  Musculoskeletal: Normal range of motion. Exhibits no edema.  Lymphadenopathy:    No cervical adenopathy.  Neurological: Alert and oriented to person, place, and time. Exhibits normal muscle tone. Gait normal. Coordination normal.  Skin: Skin is warm and dry. No rash noted. Not diaphoretic. No erythema. No pallor.  Psychiatric: Mood, memory and judgment normal.  Vitals reviewed.  LABORATORY DATA: Lab Results  Component Value Date   WBC 4.9 08/09/2020   HGB 11.2 (L) 08/09/2020   HCT 33.6 (L) 08/09/2020   MCV 87.3 08/09/2020   PLT 98 (L) 08/09/2020      Chemistry      Component Value Date/Time   NA 135 08/09/2020 0829   NA 138 11/14/2016 0824   K 4.0 08/09/2020 0829   K 4.3 11/14/2016 0824   CL 103 08/09/2020 0829   CL 104 04/07/2012 1415   CO2 22 08/09/2020 0829   CO2 23 11/14/2016 0824   BUN 7 08/09/2020 0829   BUN 9.1 11/14/2016 0824   CREATININE 0.78 08/09/2020 0829   CREATININE 0.7 11/14/2016 0824      Component Value Date/Time   CALCIUM 9.1 08/09/2020 0829   CALCIUM 9.6 11/14/2016 0824   ALKPHOS 137 (H) 08/09/2020 0829   ALKPHOS 94 11/14/2016 0824   AST 67 (H) 08/09/2020 0829   AST 28 11/14/2016 0824   ALT 71 (H) 08/09/2020 0829   ALT 24 11/14/2016 0824   BILITOT 0.7 08/09/2020 0829   BILITOT 0.41 11/14/2016 0824        RADIOGRAPHIC STUDIES:  No results found.   ASSESSMENT/PLAN:  This is a very pleasant 60 year old Caucasian female with smoldering multiple myeloma with questionable POEMS syndrome. Her myeloma panel showed continuous  increase in her free lambda light chain.   The patient previously underwent 107 cycles of subcutaneous weekly Velcade, as well as Revlimid and Decadron on and off over the last several years.  She had been off treatment since June 2021 until July 2022 in which she had evidence of disease progression.  Therefore, she was restarted on Velcade, Revlimid, and Decadron.  She has status post 1 cycle since restarting (cycle 107) she tolerated well without any concerning adverse side effects.   Labs reviewed.  Recommend that she proceed with cycle #108 today as scheduled. She is ok to treat with platelets of 98k.   We will see her back for follow-up visit in 4 weeks for evaluation before starting cycle #109.  She will continue to take Integra plus for her iron deficiency anemia. I have sent a refill to her pharmacy.   The patient was advised to call immediately if she has any concerning symptoms in the interval. The patient voices understanding of current disease status and treatment options and is in agreement with the current care plan. All questions were answered. The patient knows to call the clinic with any problems, questions or concerns. We can certainly see the patient much sooner if necessary     No orders of the defined types were placed in this encounter.    The total time spent in the appointment was 20-29 minutes.   Brenda Conner L Bartlomiej Jenkinson, PA-C 08/09/20

## 2020-08-09 ENCOUNTER — Other Ambulatory Visit: Payer: Self-pay

## 2020-08-09 ENCOUNTER — Inpatient Hospital Stay: Payer: 59

## 2020-08-09 ENCOUNTER — Encounter: Payer: Self-pay | Admitting: Physician Assistant

## 2020-08-09 ENCOUNTER — Inpatient Hospital Stay: Payer: 59 | Attending: Physician Assistant | Admitting: Physician Assistant

## 2020-08-09 VITALS — BP 125/61 | HR 71 | Temp 98.3°F | Resp 20 | Ht 66.5 in | Wt 263.9 lb

## 2020-08-09 DIAGNOSIS — Z880 Allergy status to penicillin: Secondary | ICD-10-CM | POA: Diagnosis not present

## 2020-08-09 DIAGNOSIS — I10 Essential (primary) hypertension: Secondary | ICD-10-CM | POA: Insufficient documentation

## 2020-08-09 DIAGNOSIS — R5383 Other fatigue: Secondary | ICD-10-CM | POA: Insufficient documentation

## 2020-08-09 DIAGNOSIS — Z5112 Encounter for antineoplastic immunotherapy: Secondary | ICD-10-CM | POA: Diagnosis not present

## 2020-08-09 DIAGNOSIS — D539 Nutritional anemia, unspecified: Secondary | ICD-10-CM

## 2020-08-09 DIAGNOSIS — Z9049 Acquired absence of other specified parts of digestive tract: Secondary | ICD-10-CM | POA: Diagnosis not present

## 2020-08-09 DIAGNOSIS — Z79899 Other long term (current) drug therapy: Secondary | ICD-10-CM | POA: Diagnosis not present

## 2020-08-09 DIAGNOSIS — C9000 Multiple myeloma not having achieved remission: Secondary | ICD-10-CM | POA: Diagnosis not present

## 2020-08-09 DIAGNOSIS — D509 Iron deficiency anemia, unspecified: Secondary | ICD-10-CM | POA: Insufficient documentation

## 2020-08-09 DIAGNOSIS — Z885 Allergy status to narcotic agent status: Secondary | ICD-10-CM | POA: Insufficient documentation

## 2020-08-09 DIAGNOSIS — Z7952 Long term (current) use of systemic steroids: Secondary | ICD-10-CM | POA: Insufficient documentation

## 2020-08-09 DIAGNOSIS — C9030 Solitary plasmacytoma not having achieved remission: Secondary | ICD-10-CM | POA: Diagnosis present

## 2020-08-09 LAB — CMP (CANCER CENTER ONLY)
ALT: 71 U/L — ABNORMAL HIGH (ref 0–44)
AST: 67 U/L — ABNORMAL HIGH (ref 15–41)
Albumin: 3.6 g/dL (ref 3.5–5.0)
Alkaline Phosphatase: 137 U/L — ABNORMAL HIGH (ref 38–126)
Anion gap: 10 (ref 5–15)
BUN: 7 mg/dL (ref 6–20)
CO2: 22 mmol/L (ref 22–32)
Calcium: 9.1 mg/dL (ref 8.9–10.3)
Chloride: 103 mmol/L (ref 98–111)
Creatinine: 0.78 mg/dL (ref 0.44–1.00)
GFR, Estimated: >60 mL/min (ref >60)
Glucose, Bld: 204 mg/dL — ABNORMAL HIGH (ref 70–99)
Potassium: 4 mmol/L (ref 3.5–5.1)
Sodium: 135 mmol/L (ref 135–145)
Total Bilirubin: 0.7 mg/dL (ref 0.3–1.2)
Total Protein: 7.1 g/dL (ref 6.5–8.1)

## 2020-08-09 LAB — CBC WITH DIFFERENTIAL (CANCER CENTER ONLY)
Abs Immature Granulocytes: 0.03 10*3/uL (ref 0.00–0.07)
Basophils Absolute: 0 10*3/uL (ref 0.0–0.1)
Basophils Relative: 0 %
Eosinophils Absolute: 0.1 10*3/uL (ref 0.0–0.5)
Eosinophils Relative: 3 %
HCT: 33.6 % — ABNORMAL LOW (ref 36.0–46.0)
Hemoglobin: 11.2 g/dL — ABNORMAL LOW (ref 12.0–15.0)
Immature Granulocytes: 1 %
Lymphocytes Relative: 16 %
Lymphs Abs: 0.8 10*3/uL (ref 0.7–4.0)
MCH: 29.1 pg (ref 26.0–34.0)
MCHC: 33.3 g/dL (ref 30.0–36.0)
MCV: 87.3 fL (ref 80.0–100.0)
Monocytes Absolute: 0.5 10*3/uL (ref 0.1–1.0)
Monocytes Relative: 9 %
Neutro Abs: 3.5 10*3/uL (ref 1.7–7.7)
Neutrophils Relative %: 71 %
Platelet Count: 98 10*3/uL — ABNORMAL LOW (ref 150–400)
RBC: 3.85 MIL/uL — ABNORMAL LOW (ref 3.87–5.11)
RDW: 14.8 % (ref 11.5–15.5)
WBC Count: 4.9 10*3/uL (ref 4.0–10.5)
nRBC: 0 % (ref 0.0–0.2)

## 2020-08-09 MED ORDER — INTEGRA PLUS PO CAPS: 1.0000 | ORAL_CAPSULE | Freq: Every day | ORAL | 2 refills | Status: DC

## 2020-08-09 MED ORDER — ONDANSETRON HCL 8 MG PO TABS
8.0000 mg | ORAL_TABLET | Freq: Once | ORAL | Status: AC
  Administered 2020-08-09: 8 mg via ORAL

## 2020-08-09 MED ORDER — ONDANSETRON HCL 8 MG PO TABS
ORAL_TABLET | ORAL | Status: AC
  Filled 2020-08-09: qty 1

## 2020-08-09 MED ORDER — BORTEZOMIB CHEMO SQ INJECTION 3.5 MG (2.5MG/ML)
1.3000 mg/m2 | Freq: Once | INTRAMUSCULAR | Status: AC
  Administered 2020-08-09: 3 mg via SUBCUTANEOUS
  Filled 2020-08-09: qty 1.2

## 2020-08-09 NOTE — Patient Instructions (Signed)
[facility] MEDICAL [facility]COLOG[facility]  Discharge Instructions: Thank you for choosing [facility] to provide your oncology and hematology care.   If you have a lab appointment with the Cancer Center, please go directly to the Cancer Center and check in at the registration area.   Wear comfortable clothing and clothing appropriate for easy access to any Portacath or PICC line.   We strive to give you quality time with your provider. You may need to reschedule your appointment if you arrive late (15 or more minutes).  Arriving late affects you and other patients whose appointments are after yours.  Also, if you miss three or more appointments without notifying the office, you may be dismissed from the clinic at the provider's discretion.      For prescription refill requests, have your pharmacy contact our office and allow 72 hours for refills to be completed.    Today you received the following chemotherapy and/or immunotherapy agents Velcade       To help prevent nausea and vomiting after your treatment, we encourage you to take your nausea medication as directed.  BELOW ARE SYMPTOMS THAT SHOULD BE REPORTED IMMEDIATELY: *FEVER GREATER THAN 100.4 F (38 C) OR HIGHER *CHILLS OR SWEATING *NAUSEA AND VOMITING THAT IS NOT CONTROLLED WITH YOUR NAUSEA MEDICATION *UNUSUAL SHORTNESS OF BREATH *UNUSUAL BRUISING OR BLEEDING *URINARY PROBLEMS (pain or burning when urinating, or frequent urination) *BOWEL PROBLEMS (unusual diarrhea, constipation, pain near the anus) TENDERNESS IN MOUTH AND THROAT WITH OR WITHOUT PRESENCE OF ULCERS (sore throat, sores in mouth, or a toothache) UNUSUAL RASH, SWELLING OR PAIN  UNUSUAL VAGINAL DISCHARGE OR ITCHING   Items with * indicate a potential emergency and should be followed up as soon as possible or go to the Emergency Department if any problems should occur.  Please show the CHEMOTHERAPY ALERT CARD or IMMUNOTHERAPY ALERT CARD at check-in to  the Emergency Department and triage nurse.  Should you have questions after your visit or need to cancel or reschedule your appointment, please contact [facility]  Dept: [phone]  and follow the prompts.  Office hours are 8:00 a.m. to 4:30 p.m. Monday - Friday. Please note that voicemails left after 4:00 p.m. may not be returned until the following business day.  We are closed weekends and major holidays. You have access to a nurse at all times for urgent questions. Please call the main number to the clinic Dept: [phone] and follow the prompts.   For any non-urgent questions, you may also contact your provider using MyChart. We now offer e-Visits for anyone 18 and older to request care online for non-urgent symptoms. For details visit [email or web address].   Also download the MyChart app! Go to the app store, search "MyChart", open the app, select , and log in with your MyChart username and password.  Due to Covid, a mask is required upon entering the hospital/clinic. If you do not have a mask, one will be given to you upon arrival. For doctor visits, patients may have 1 support person aged 18 or older with them. For treatment visits, patients cannot have anyone with them due to current Covid guidelines and our immunocompromised population.   

## 2020-08-09 NOTE — Progress Notes (Signed)
Per Cassandra ok to treat with plt 98.

## 2020-08-10 ENCOUNTER — Other Ambulatory Visit: Payer: Self-pay | Admitting: Internal Medicine

## 2020-08-10 DIAGNOSIS — C9000 Multiple myeloma not having achieved remission: Secondary | ICD-10-CM

## 2020-08-11 ENCOUNTER — Encounter: Payer: Self-pay | Admitting: Internal Medicine

## 2020-08-11 ENCOUNTER — Telehealth: Payer: Self-pay | Admitting: Physician Assistant

## 2020-08-11 MED ORDER — LENALIDOMIDE 25 MG PO CAPS: ORAL_CAPSULE | ORAL | 0 refills | Status: DC

## 2020-08-11 NOTE — Addendum Note (Signed)
Addended by: Ardeen Garland on: 08/11/2020 01:50 PM   Modules accepted: Orders

## 2020-08-11 NOTE — Telephone Encounter (Signed)
Scheduled per los. Called, left msg. Mailed printout  

## 2020-08-11 NOTE — Telephone Encounter (Signed)
Faxed revlimid auth number to [facility].

## 2020-08-16 ENCOUNTER — Inpatient Hospital Stay: Payer: 59

## 2020-08-16 ENCOUNTER — Other Ambulatory Visit: Payer: Self-pay

## 2020-08-16 VITALS — BP 123/59 | HR 64 | Temp 98.6°F | Resp 20

## 2020-08-16 DIAGNOSIS — C9000 Multiple myeloma not having achieved remission: Secondary | ICD-10-CM

## 2020-08-16 DIAGNOSIS — Z5112 Encounter for antineoplastic immunotherapy: Secondary | ICD-10-CM | POA: Diagnosis not present

## 2020-08-16 LAB — CMP (CANCER CENTER ONLY)
ALT: 73 U/L — ABNORMAL HIGH (ref 0–44)
AST: 57 U/L — ABNORMAL HIGH (ref 15–41)
Albumin: 3.6 g/dL (ref 3.5–5.0)
Alkaline Phosphatase: 143 U/L — ABNORMAL HIGH (ref 38–126)
Anion gap: 9 (ref 5–15)
BUN: 7 mg/dL (ref 6–20)
CO2: 24 mmol/L (ref 22–32)
Calcium: 9.2 mg/dL (ref 8.9–10.3)
Chloride: 103 mmol/L (ref 98–111)
Creatinine: 0.81 mg/dL (ref 0.44–1.00)
GFR, Estimated: >60 mL/min (ref >60)
Glucose, Bld: 207 mg/dL — ABNORMAL HIGH (ref 70–99)
Potassium: 3.8 mmol/L (ref 3.5–5.1)
Sodium: 136 mmol/L (ref 135–145)
Total Bilirubin: 0.7 mg/dL (ref 0.3–1.2)
Total Protein: 7 g/dL (ref 6.5–8.1)

## 2020-08-16 LAB — CBC WITH DIFFERENTIAL (CANCER CENTER ONLY)
Abs Immature Granulocytes: 0.02 10*3/uL (ref 0.00–0.07)
Basophils Absolute: 0 10*3/uL (ref 0.0–0.1)
Basophils Relative: 1 %
Eosinophils Absolute: 0.2 10*3/uL (ref 0.0–0.5)
Eosinophils Relative: 4 %
HCT: 32.6 % — ABNORMAL LOW (ref 36.0–46.0)
Hemoglobin: 10.7 g/dL — ABNORMAL LOW (ref 12.0–15.0)
Immature Granulocytes: 1 %
Lymphocytes Relative: 15 %
Lymphs Abs: 0.6 10*3/uL — ABNORMAL LOW (ref 0.7–4.0)
MCH: 29.3 pg (ref 26.0–34.0)
MCHC: 32.8 g/dL (ref 30.0–36.0)
MCV: 89.3 fL (ref 80.0–100.0)
Monocytes Absolute: 0.4 10*3/uL (ref 0.1–1.0)
Monocytes Relative: 10 %
Neutro Abs: 2.5 10*3/uL (ref 1.7–7.7)
Neutrophils Relative %: 69 %
Platelet Count: 95 10*3/uL — ABNORMAL LOW (ref 150–400)
RBC: 3.65 MIL/uL — ABNORMAL LOW (ref 3.87–5.11)
RDW: 15.3 % (ref 11.5–15.5)
WBC Count: 3.7 10*3/uL — ABNORMAL LOW (ref 4.0–10.5)
nRBC: 0 % (ref 0.0–0.2)

## 2020-08-16 MED ORDER — ONDANSETRON HCL 8 MG PO TABS
8.0000 mg | ORAL_TABLET | Freq: Once | ORAL | Status: AC
  Administered 2020-08-16: 8 mg via ORAL

## 2020-08-16 MED ORDER — BORTEZOMIB CHEMO SQ INJECTION 3.5 MG (2.5MG/ML)
1.3000 mg/m2 | Freq: Once | INTRAMUSCULAR | Status: AC
  Administered 2020-08-16: 3 mg via SUBCUTANEOUS
  Filled 2020-08-16: qty 1.2

## 2020-08-16 MED ORDER — ONDANSETRON HCL 8 MG PO TABS
ORAL_TABLET | ORAL | Status: AC
  Filled 2020-08-16: qty 1

## 2020-08-16 NOTE — Progress Notes (Signed)
Per [name],PA ok to treat with platelets of 95 today

## 2020-08-16 NOTE — Patient Instructions (Signed)
[facility]  Discharge Instructions: Thank you for choosing [facility] to provide your oncology and hematology care.   If you have a lab appointment with the Cancer Center, please go directly to the Cancer Center and check in at the registration area.   Wear comfortable clothing and clothing appropriate for easy access to any Portacath or PICC line.   We strive to give you quality time with your provider. You may need to reschedule your appointment if you arrive late (15 or more minutes).  Arriving late affects you and other patients whose appointments are after yours.  Also, if you miss three or more appointments without notifying the office, you may be dismissed from the clinic at the provider's discretion.      For prescription refill requests, have your pharmacy contact our office and allow 72 hours for refills to be completed.    Today you received the following chemotherapy and/or immunotherapy agents Velcade       To help prevent nausea and vomiting after your treatment, we encourage you to take your nausea medication as directed.  BELOW ARE SYMPTOMS THAT SHOULD BE REPORTED IMMEDIATELY: *FEVER GREATER THAN 100.4 F (38 C) OR HIGHER *CHILLS OR SWEATING *NAUSEA AND VOMITING THAT IS NOT CONTROLLED WITH YOUR NAUSEA MEDICATION *UNUSUAL SHORTNESS OF BREATH *UNUSUAL BRUISING OR BLEEDING *URINARY PROBLEMS (pain or burning when urinating, or frequent urination) *BOWEL PROBLEMS (unusual diarrhea, constipation, pain near the anus) TENDERNESS IN MOUTH AND THROAT WITH OR WITHOUT PRESENCE OF ULCERS (sore throat, sores in mouth, or a toothache) UNUSUAL RASH, SWELLING OR PAIN  UNUSUAL VAGINAL DISCHARGE OR ITCHING   Items with * indicate a potential emergency and should be followed up as soon as possible or go to the Emergency Department if any problems should occur.  Please show the CHEMOTHERAPY ALERT CARD or IMMUNOTHERAPY ALERT CARD at check-in to  the Emergency Department and triage nurse.  Should you have questions after your visit or need to cancel or reschedule your appointment, please contact [facility]  Dept: [phone]  and follow the prompts.  Office hours are 8:00 a.m. to 4:30 p.m. Monday - Friday. Please note that voicemails left after 4:00 p.m. may not be returned until the following business day.  We are closed weekends and major holidays. You have access to a nurse at all times for urgent questions. Please call the main number to the clinic Dept: [phone] and follow the prompts.   For any non-urgent questions, you may also contact your provider using MyChart. We now offer e-Visits for anyone 18 and older to request care online for non-urgent symptoms. For details visit [email or web address].   Also download the MyChart app! Go to the app store, search "MyChart", open the app, select [facility], and log in with your MyChart username and password.  Due to Covid, a mask is required upon entering the hospital/clinic. If you do not have a mask, one will be given to you upon arrival. For doctor visits, patients may have 1 support person aged 18 or older with them. For treatment visits, patients cannot have anyone with them due to current Covid guidelines and our immunocompromised population.   

## 2020-08-23 ENCOUNTER — Other Ambulatory Visit: Payer: Self-pay

## 2020-08-23 ENCOUNTER — Inpatient Hospital Stay: Payer: 59

## 2020-08-23 ENCOUNTER — Encounter: Payer: Self-pay | Admitting: Internal Medicine

## 2020-08-23 VITALS — BP 148/68 | HR 70 | Temp 99.2°F | Resp 18 | Wt 267.5 lb

## 2020-08-23 DIAGNOSIS — Z5112 Encounter for antineoplastic immunotherapy: Secondary | ICD-10-CM | POA: Diagnosis not present

## 2020-08-23 DIAGNOSIS — C9000 Multiple myeloma not having achieved remission: Secondary | ICD-10-CM

## 2020-08-23 LAB — CBC WITH DIFFERENTIAL (CANCER CENTER ONLY)
Abs Immature Granulocytes: 0.02 10*3/uL (ref 0.00–0.07)
Basophils Absolute: 0.1 10*3/uL (ref 0.0–0.1)
Basophils Relative: 3 %
Eosinophils Absolute: 0.1 10*3/uL (ref 0.0–0.5)
Eosinophils Relative: 2 %
HCT: 32.3 % — ABNORMAL LOW (ref 36.0–46.0)
Hemoglobin: 10.8 g/dL — ABNORMAL LOW (ref 12.0–15.0)
Immature Granulocytes: 1 %
Lymphocytes Relative: 21 %
Lymphs Abs: 0.8 10*3/uL (ref 0.7–4.0)
MCH: 29.7 pg (ref 26.0–34.0)
MCHC: 33.4 g/dL (ref 30.0–36.0)
MCV: 88.7 fL (ref 80.0–100.0)
Monocytes Absolute: 0.4 10*3/uL (ref 0.1–1.0)
Monocytes Relative: 10 %
Neutro Abs: 2.5 10*3/uL (ref 1.7–7.7)
Neutrophils Relative %: 63 %
Platelet Count: 113 10*3/uL — ABNORMAL LOW (ref 150–400)
RBC: 3.64 MIL/uL — ABNORMAL LOW (ref 3.87–5.11)
RDW: 15.1 % (ref 11.5–15.5)
WBC Count: 3.9 10*3/uL — ABNORMAL LOW (ref 4.0–10.5)
nRBC: 0 % (ref 0.0–0.2)

## 2020-08-23 LAB — CMP (CANCER CENTER ONLY)
ALT: 49 U/L — ABNORMAL HIGH (ref 0–44)
AST: 56 U/L — ABNORMAL HIGH (ref 15–41)
Albumin: 3.7 g/dL (ref 3.5–5.0)
Alkaline Phosphatase: 118 U/L (ref 38–126)
Anion gap: 9 (ref 5–15)
BUN: 10 mg/dL (ref 6–20)
CO2: 20 mmol/L — ABNORMAL LOW (ref 22–32)
Calcium: 8.9 mg/dL (ref 8.9–10.3)
Chloride: 107 mmol/L (ref 98–111)
Creatinine: 0.94 mg/dL (ref 0.44–1.00)
GFR, Estimated: >60 mL/min (ref >60)
Glucose, Bld: 207 mg/dL — ABNORMAL HIGH (ref 70–99)
Potassium: 3.9 mmol/L (ref 3.5–5.1)
Sodium: 136 mmol/L (ref 135–145)
Total Bilirubin: 0.5 mg/dL (ref 0.3–1.2)
Total Protein: 6.8 g/dL (ref 6.5–8.1)

## 2020-08-23 MED ORDER — BORTEZOMIB CHEMO SQ INJECTION 3.5 MG (2.5MG/ML)
1.3000 mg/m2 | Freq: Once | INTRAMUSCULAR | Status: AC
  Administered 2020-08-23: 3 mg via SUBCUTANEOUS
  Filled 2020-08-23: qty 1.2

## 2020-08-23 MED ORDER — ONDANSETRON HCL 8 MG PO TABS
8.0000 mg | ORAL_TABLET | Freq: Once | ORAL | Status: AC
Start: 2020-08-23 — End: 2020-08-23
  Administered 2020-08-23: 8 mg via ORAL
  Filled 2020-08-23: qty 1

## 2020-08-23 NOTE — Patient Instructions (Signed)
[facility]  Discharge Instructions: Thank you for choosing [facility] to provide your oncology and hematology care.   If you have a lab appointment with the [facility], please go directly to the [facility] and check in at the registration area.   Wear comfortable clothing and clothing appropriate for easy access to any Portacath or PICC line.   We strive to give you quality time with your provider. You may need to reschedule your appointment if you arrive late (15 or more minutes).  Arriving late affects you and other patients whose appointments are after yours.  Also, if you miss three or more appointments without notifying the office, you may be dismissed from the clinic at the provider's discretion.      For prescription refill requests, have your pharmacy contact our office and allow 72 hours for refills to be completed.    Today you received the following chemotherapy and/or immunotherapy agents velcade      To help prevent nausea and vomiting after your treatment, we encourage you to take your nausea medication as directed.  BELOW ARE SYMPTOMS THAT SHOULD BE REPORTED IMMEDIATELY: *FEVER GREATER THAN 100.4 F (38 C) OR HIGHER *CHILLS OR SWEATING *NAUSEA AND VOMITING THAT IS NOT CONTROLLED WITH YOUR NAUSEA MEDICATION *UNUSUAL SHORTNESS OF BREATH *UNUSUAL BRUISING OR BLEEDING *URINARY PROBLEMS (pain or burning when urinating, or frequent urination) *BOWEL PROBLEMS (unusual diarrhea, constipation, pain near the anus) TENDERNESS IN MOUTH AND THROAT WITH OR WITHOUT PRESENCE OF ULCERS (sore throat, sores in mouth, or a toothache) UNUSUAL RASH, SWELLING OR PAIN  UNUSUAL VAGINAL DISCHARGE OR ITCHING   Items with * indicate a potential emergency and should be followed up as soon as possible or go to the Emergency Department if any problems should occur.  Please show the CHEMOTHERAPY ALERT CARD or IMMUNOTHERAPY ALERT CARD at check-in to the  Emergency Department and triage nurse.  Should you have questions after your visit or need to cancel or reschedule your appointment, please contact [facility]  Dept: [phone]  and follow the prompts.  Office hours are 8:00 a.m. to 4:30 p.m. Monday - Friday. Please note that voicemails left after 4:00 p.m. may not be returned until the following business day.  We are closed weekends and major holidays. You have access to a nurse at all times for urgent questions. Please call the main number to the clinic Dept: [phone] and follow the prompts.   For any non-urgent questions, you may also contact your provider using MyChart. We now offer e-Visits for anyone 55 and older to request care online for non-urgent symptoms. For details visit [email or web address].   Also download the MyChart app! Go to the app store, search "MyChart", open the app, select [facility], and log in with your MyChart username and password.  Due to Covid, a mask is required upon entering the hospital/clinic. If you do not have a mask, one will be given to you upon arrival. For doctor visits, patients may have 1 support person aged 52 or older with them. For treatment visits, patients cannot have anyone with them due to current Covid guidelines and our immunocompromised population.

## 2020-08-29 ENCOUNTER — Other Ambulatory Visit: Payer: Self-pay | Admitting: Medical Oncology

## 2020-08-29 DIAGNOSIS — C9000 Multiple myeloma not having achieved remission: Secondary | ICD-10-CM

## 2020-08-30 ENCOUNTER — Inpatient Hospital Stay: Payer: 59

## 2020-08-30 ENCOUNTER — Other Ambulatory Visit: Payer: Self-pay

## 2020-08-30 VITALS — BP 128/61 | HR 65 | Temp 98.6°F | Resp 16 | Wt 266.5 lb

## 2020-08-30 DIAGNOSIS — C9000 Multiple myeloma not having achieved remission: Secondary | ICD-10-CM

## 2020-08-30 DIAGNOSIS — Z5112 Encounter for antineoplastic immunotherapy: Secondary | ICD-10-CM | POA: Diagnosis not present

## 2020-08-30 LAB — CBC WITH DIFFERENTIAL (CANCER CENTER ONLY)
Abs Immature Granulocytes: 0.02 10*3/uL (ref 0.00–0.07)
Basophils Absolute: 0.1 10*3/uL (ref 0.0–0.1)
Basophils Relative: 1 %
Eosinophils Absolute: 0.2 10*3/uL (ref 0.0–0.5)
Eosinophils Relative: 4 %
HCT: 33.3 % — ABNORMAL LOW (ref 36.0–46.0)
Hemoglobin: 11.2 g/dL — ABNORMAL LOW (ref 12.0–15.0)
Immature Granulocytes: 1 %
Lymphocytes Relative: 16 %
Lymphs Abs: 0.6 10*3/uL — ABNORMAL LOW (ref 0.7–4.0)
MCH: 29.9 pg (ref 26.0–34.0)
MCHC: 33.6 g/dL (ref 30.0–36.0)
MCV: 89 fL (ref 80.0–100.0)
Monocytes Absolute: 0.2 10*3/uL (ref 0.1–1.0)
Monocytes Relative: 5 %
Neutro Abs: 2.8 10*3/uL (ref 1.7–7.7)
Neutrophils Relative %: 73 %
Platelet Count: 109 10*3/uL — ABNORMAL LOW (ref 150–400)
RBC: 3.74 MIL/uL — ABNORMAL LOW (ref 3.87–5.11)
RDW: 15.1 % (ref 11.5–15.5)
WBC Count: 3.8 10*3/uL — ABNORMAL LOW (ref 4.0–10.5)
nRBC: 0 % (ref 0.0–0.2)

## 2020-08-30 LAB — CMP (CANCER CENTER ONLY)
ALT: 47 U/L — ABNORMAL HIGH (ref 0–44)
AST: 52 U/L — ABNORMAL HIGH (ref 15–41)
Albumin: 3.8 g/dL (ref 3.5–5.0)
Alkaline Phosphatase: 127 U/L — ABNORMAL HIGH (ref 38–126)
Anion gap: 10 (ref 5–15)
BUN: 6 mg/dL (ref 6–20)
CO2: 24 mmol/L (ref 22–32)
Calcium: 9.3 mg/dL (ref 8.9–10.3)
Chloride: 106 mmol/L (ref 98–111)
Creatinine: 0.79 mg/dL (ref 0.44–1.00)
GFR, Estimated: >60 mL/min (ref >60)
Glucose, Bld: 127 mg/dL — ABNORMAL HIGH (ref 70–99)
Potassium: 3.7 mmol/L (ref 3.5–5.1)
Sodium: 140 mmol/L (ref 135–145)
Total Bilirubin: 0.8 mg/dL (ref 0.3–1.2)
Total Protein: 7 g/dL (ref 6.5–8.1)

## 2020-08-30 MED ORDER — ONDANSETRON HCL 8 MG PO TABS
ORAL_TABLET | ORAL | Status: AC
  Filled 2020-08-30: qty 1

## 2020-08-30 MED ORDER — BORTEZOMIB CHEMO SQ INJECTION 3.5 MG (2.5MG/ML)
1.3000 mg/m2 | Freq: Once | INTRAMUSCULAR | Status: AC
  Administered 2020-08-30: 3 mg via SUBCUTANEOUS
  Filled 2020-08-30: qty 1.2

## 2020-08-30 MED ORDER — ONDANSETRON HCL 8 MG PO TABS
8.0000 mg | ORAL_TABLET | Freq: Once | ORAL | Status: AC
  Administered 2020-08-30: 8 mg via ORAL

## 2020-08-30 NOTE — Patient Instructions (Signed)
[facility]  Discharge Instructions: Thank you for choosing [facility] to provide your oncology and hematology care.   If you have a lab appointment with the Cancer Center, please go directly to the Cancer Center and check in at the registration area.   Wear comfortable clothing and clothing appropriate for easy access to any Portacath or PICC line.   We strive to give you quality time with your provider. You may need to reschedule your appointment if you arrive late (15 or more minutes).  Arriving late affects you and other patients whose appointments are after yours.  Also, if you miss three or more appointments without notifying the office, you may be dismissed from the clinic at the provider's discretion.      For prescription refill requests, have your pharmacy contact our office and allow 72 hours for refills to be completed.    Today you received the following chemotherapy and/or immunotherapy agent: Bortezomib (Velcade).   To help prevent nausea and vomiting after your treatment, we encourage you to take your nausea medication as directed.  BELOW ARE SYMPTOMS THAT SHOULD BE REPORTED IMMEDIATELY: *FEVER GREATER THAN 100.4 F (38 C) OR HIGHER *CHILLS OR SWEATING *NAUSEA AND VOMITING THAT IS NOT CONTROLLED WITH YOUR NAUSEA MEDICATION *UNUSUAL SHORTNESS OF BREATH *UNUSUAL BRUISING OR BLEEDING *URINARY PROBLEMS (pain or burning when urinating, or frequent urination) *BOWEL PROBLEMS (unusual diarrhea, constipation, pain near the anus) TENDERNESS IN MOUTH AND THROAT WITH OR WITHOUT PRESENCE OF ULCERS (sore throat, sores in mouth, or a toothache) UNUSUAL RASH, SWELLING OR PAIN  UNUSUAL VAGINAL DISCHARGE OR ITCHING   Items with * indicate a potential emergency and should be followed up as soon as possible or go to the Emergency Department if any problems should occur.  Please show the CHEMOTHERAPY ALERT CARD or IMMUNOTHERAPY ALERT CARD at  check-in to the Emergency Department and triage nurse.  Should you have questions after your visit or need to cancel or reschedule your appointment, please contact [facility]  Dept: [phone]  and follow the prompts.  Office hours are 8:00 a.m. to 4:30 p.m. Monday - Friday. Please note that voicemails left after 4:00 p.m. may not be returned until the following business day.  We are closed weekends and major holidays. You have access to a nurse at all times for urgent questions. Please call the main number to the clinic Dept: [phone] and follow the prompts.   For any non-urgent questions, you may also contact your provider using MyChart. We now offer e-Visits for anyone 18 and older to request care online for non-urgent symptoms. For details visit [email or web address].   Also download the MyChart app! Go to the app store, search "MyChart", open the app, select [facility], and log in with your MyChart username and password.  Due to Covid, a mask is required upon entering the hospital/clinic. If you do not have a mask, one will be given to you upon arrival. For doctor visits, patients may have 1 support person aged 18 or older with them. For treatment visits, patients cannot have anyone with them due to current Covid guidelines and our immunocompromised population.   

## 2020-09-06 ENCOUNTER — Inpatient Hospital Stay: Payer: 59 | Admitting: Internal Medicine

## 2020-09-06 ENCOUNTER — Other Ambulatory Visit: Payer: Self-pay

## 2020-09-06 ENCOUNTER — Inpatient Hospital Stay: Payer: 59

## 2020-09-06 VITALS — Wt 261.5 lb

## 2020-09-06 VITALS — BP 147/55 | HR 66 | Temp 97.6°F | Resp 20 | Ht 66.5 in | Wt 165.4 lb

## 2020-09-06 DIAGNOSIS — C9000 Multiple myeloma not having achieved remission: Secondary | ICD-10-CM

## 2020-09-06 DIAGNOSIS — Z5112 Encounter for antineoplastic immunotherapy: Secondary | ICD-10-CM | POA: Diagnosis not present

## 2020-09-06 LAB — CBC WITH DIFFERENTIAL (CANCER CENTER ONLY)
Abs Immature Granulocytes: 0.02 10*3/uL (ref 0.00–0.07)
Basophils Absolute: 0 10*3/uL (ref 0.0–0.1)
Basophils Relative: 1 %
Eosinophils Absolute: 0.2 10*3/uL (ref 0.0–0.5)
Eosinophils Relative: 5 %
HCT: 32.7 % — ABNORMAL LOW (ref 36.0–46.0)
Hemoglobin: 10.9 g/dL — ABNORMAL LOW (ref 12.0–15.0)
Immature Granulocytes: 1 %
Lymphocytes Relative: 16 %
Lymphs Abs: 0.7 10*3/uL (ref 0.7–4.0)
MCH: 30 pg (ref 26.0–34.0)
MCHC: 33.3 g/dL (ref 30.0–36.0)
MCV: 90.1 fL (ref 80.0–100.0)
Monocytes Absolute: 0.5 10*3/uL (ref 0.1–1.0)
Monocytes Relative: 11 %
Neutro Abs: 2.8 10*3/uL (ref 1.7–7.7)
Neutrophils Relative %: 66 %
Platelet Count: 88 10*3/uL — ABNORMAL LOW (ref 150–400)
RBC: 3.63 MIL/uL — ABNORMAL LOW (ref 3.87–5.11)
RDW: 15.1 % (ref 11.5–15.5)
WBC Count: 4.2 10*3/uL (ref 4.0–10.5)
nRBC: 0 % (ref 0.0–0.2)

## 2020-09-06 LAB — CMP (CANCER CENTER ONLY)
ALT: 58 U/L — ABNORMAL HIGH (ref 0–44)
AST: 55 U/L — ABNORMAL HIGH (ref 15–41)
Albumin: 3.7 g/dL (ref 3.5–5.0)
Alkaline Phosphatase: 140 U/L — ABNORMAL HIGH (ref 38–126)
Anion gap: 11 (ref 5–15)
BUN: 7 mg/dL (ref 6–20)
CO2: 21 mmol/L — ABNORMAL LOW (ref 22–32)
Calcium: 8.8 mg/dL — ABNORMAL LOW (ref 8.9–10.3)
Chloride: 103 mmol/L (ref 98–111)
Creatinine: 0.81 mg/dL (ref 0.44–1.00)
GFR, Estimated: >60 mL/min (ref >60)
Glucose, Bld: 268 mg/dL — ABNORMAL HIGH (ref 70–99)
Potassium: 3.7 mmol/L (ref 3.5–5.1)
Sodium: 135 mmol/L (ref 135–145)
Total Bilirubin: 0.9 mg/dL (ref 0.3–1.2)
Total Protein: 7 g/dL (ref 6.5–8.1)

## 2020-09-06 MED ORDER — BORTEZOMIB CHEMO SQ INJECTION 3.5 MG (2.5MG/ML)
1.3000 mg/m2 | Freq: Once | INTRAMUSCULAR | Status: AC
  Administered 2020-09-06: 3 mg via SUBCUTANEOUS
  Filled 2020-09-06: qty 1.2

## 2020-09-06 MED ORDER — ONDANSETRON HCL 8 MG PO TABS
8.0000 mg | ORAL_TABLET | Freq: Once | ORAL | Status: AC
  Administered 2020-09-06: 8 mg via ORAL
  Filled 2020-09-06: qty 1

## 2020-09-06 NOTE — Progress Notes (Signed)
OK to treat with today's low platelet 88K count per Dr Julien Nordmann

## 2020-09-06 NOTE — Progress Notes (Signed)
[facility] [phone]   [phone]  OFFICE PROGRESS NOTE  [name], MD [place] [id number]  DIAGNOSIS: Plasma cell dyscrasia initially diagnosed as MGUS in September 2010, with additional symptoms suggestive of POEMS syndrome.   PRIOR THERAPY:  1) Velcade 1.3 MG/M2 subcutaneously with Decadron 40 mg by mouth on a weekly basis. First cycle 11/24/2013. She status post 31 weekly doses of treatment. 2) Velcade 1.3 MG/M2 subcutaneously and weekly basis with Decadron 40 mg by mouth weekly. First dose 02/01/2015. Status post 28 cycles. 3) Revlimid 25 mg by mouth daily for 21 days every 4 weeks with weekly Decadron 20 mg. started in 11/27/2015. Status post 3 cycles discontinued secondary to lack of response.  CURRENT THERAPY: Systemic treatment with Velcade 1.3 MG/KG weekly, Revlimid 25 mg by mouth daily for 21 days every 4 weeks in addition to Decadron 20 mg by mouth weekly. First dose 03/06/2016. Status post 34 cycles.  She has a break off treatment from June 2021 until July 2022.  INTERVAL HISTORY: Brenda Conner 60 y.o. female returns to the clinic today for follow-up visit.  The patient is feeling fine today with no concerning complaints except for mild fatigue.  She was out of her oral iron tablet recently but she resumed it again few days ago.  She denied having any current chest pain, shortness of breath, cough or hemoptysis.  She denied having any fever or chills.  She has no nausea, vomiting, diarrhea or constipation.  She has no headache or visual changes.  She tolerated the last cycle of her treatment fairly well.  She is here today for evaluation before starting cycle #35.   MEDICAL HISTORY: Past Medical History:  Diagnosis Date   Acid reflux    Anxiety    Asthma    Cancer ([facility])    waldenstroms/ macroglobinulemia   Depression    Depression    Diabetes mellitus without complication ([facility])    Dysuria  02/28/2016   Hypercholesteremia    Hypertension    Hypothyroidism    Macroglobulinemia ([facility])    ? POEMS syndrome   Multiple myeloma ([facility])    Obesity    PONV (postoperative nausea and vomiting)    Sleep apnea    CPAP at bedtime   Sleep apnea     ALLERGIES:  is allergic to codeine, hydrocodone, lortab [hydrocodone-acetaminophen], onion, shellfish allergy, and amoxicillin.  MEDICATIONS:  Current Outpatient Medications  Medication Sig Dispense Refill   ACCU-CHEK AVIVA PLUS test strip USE TO CHECK SUGAR TWICE A DAY 90     acyclovir (ZOVIRAX) 400 MG tablet Take 1 tablet (400 mg total) by mouth 2 (two) times daily. 180 tablet 1   ALPRAZolam (XANAX) 0.5 MG tablet Take 1 tablet (0.5 mg total) by mouth at bedtime as needed for anxiety. 30 tablet 1   aspirin 81 MG chewable tablet Chew by mouth daily.     Azelastine HCl 0.15 % SOLN as needed.     B-D UF III MINI PEN NEEDLES 31G X 5 MM MISC      Blood Glucose Monitoring Suppl (ONE TOUCH ULTRA MINI) W/DEVICE KIT See admin instructions. Reported on 01/25/2015  0   bortezomib IV (VELCADE) 3.5 MG injection Inject 1.3 mg/m2 into the vein once.     Cetirizine HCl (ZYRTEC ALLERGY PO) Take by mouth daily.     dexamethasone (DECADRON) 4 MG tablet 5 tablets p.o. weekly on  the day of chemotherapy. 40 tablet 2   DiphenhydrAMINE HCl (BENADRYL ALLERGY PO) Take by mouth as needed.     FeFum-FePoly-FA-B Cmp-C-Biot (INTEGRA PLUS) CAPS Take 1 capsule by mouth daily. 30 capsule 2   Fexofenadine HCl (ALLEGRA ALLERGY PO) Take by mouth daily.     Fluticasone-Salmeterol (ADVAIR) 100-50 MCG/DOSE AEPB Inhale 2 puffs into the lungs every 12 (twelve) hours.     ibuprofen (ADVIL,MOTRIN) 100 MG tablet Take 100 mg by mouth every 6 (six) hours as needed. Reported on 05/31/2015     Insulin Glargine (BASAGLAR KWIKPEN) 100 UNIT/ML Inject 20 Units into the skin at bedtime.     lenalidomide (REVLIMID) 25 MG capsule TAKE 1 CAPSULE BY MOUTH ONCE DAILY 21 DAYS ON AND 7 DAYS OFF 21  capsule 0   levothyroxine (SYNTHROID, LEVOTHROID) 150 MCG tablet Take 150 mcg by mouth daily before breakfast.     lisinopril (PRINIVIL,ZESTRIL) 10 MG tablet Take 10 mg by mouth daily. auth number 05/29/2018 [id number]  3   metFORMIN (GLUCOPHAGE-XR) 500 MG 24 hr tablet Take 500 mg by mouth daily.     NOVOLOG FLEXPEN 100 UNIT/ML FlexPen Inject 15 Units into the skin in the morning, at noon, and at bedtime. Per Sliding Scale     omeprazole (PRILOSEC) 40 MG capsule Take 40 mg by mouth 2 (two) times daily.      ondansetron (ZOFRAN-ODT) 8 MG disintegrating tablet Take 1 tablet (8 mg total) by mouth every 8 (eight) hours as needed. Reported on 05/31/2015 30 tablet 2   pravastatin (PRAVACHOL) 40 MG tablet Take 40 mg by mouth every evening.      PROAIR HFA 108 (90 Base) MCG/ACT inhaler Reported on 06/21/2015  1   sertraline (ZOLOFT) 50 MG tablet Take 3 tablets (150 mg total) by mouth daily. 270 tablet 3   EPIPEN 2-PAK 0.3 MG/0.3ML SOAJ injection Reported on 06/21/2015 (Patient not taking: No sig reported)  1   loperamide (IMODIUM) 2 MG capsule Take 2 mg by mouth as needed for diarrhea or loose stools. (Patient not taking: Reported on 09/06/2020)     magic mouthwash w/lidocaine SOLN Take 5 mLs by mouth 4 (four) times daily as needed for mouth pain. Swish, Gargle, and spit (Patient not taking: Reported on 09/06/2020) 240 mL 1   potassium chloride SA (KLOR-CON) 20 MEQ tablet Take 2 tablets (40 mEq total) by mouth 2 (two) times daily. (Patient not taking: No sig reported) 14 tablet 0   verapamil (CALAN-SR) 240 MG CR tablet Take 240 mg by mouth 2 (two) times daily.     No current facility-administered medications for this visit.    SURGICAL HISTORY:  Past Surgical History:  Procedure Laterality Date   ABLATION  09/09/2007   HTA and polyp resection   BACK SURGERY  03/08/2004   herniation, L4-L5   Bil Laprascopic knee surgery     BONE MARROW BIOPSY     2011   BONE MARROW BIOPSY  04/08/2012   BONE MARROW BIOPSY   01/2020   CHOLECYSTECTOMY     FOOT SURGERY  01/09/1996   KNEE ARTHROSCOPY W/ MENISCAL REPAIR  10/10 ; 3/11   LAPAROSCOPIC CHOLECYSTECTOMY  01/09/1995   NASAL SINUS SURGERY  01/08/2002   UTERINE FIBROID EMBOLIZATION      REVIEW OF SYSTEMS:  A comprehensive review of systems was negative except for: Constitutional: positive for fatigue   PHYSICAL EXAMINATION: General appearance: alert, cooperative, fatigued, and no distress Head: Normocephalic, without obvious abnormality, atraumatic Neck: no adenopathy Lymph  nodes: Cervical, supraclavicular, and axillary nodes normal. Resp: clear to auscultation bilaterally Back: symmetric, no curvature. ROM normal. No CVA tenderness. Cardio: regular rate and rhythm, S1, S2 normal, no murmur, click, rub or gallop GI: soft, non-tender; bowel sounds normal; no masses,  no organomegaly Extremities: extremities normal, atraumatic, no cyanosis or edema  ECOG PERFORMANCE STATUS: 1 - Symptomatic but completely ambulatory  Blood pressure (!) 147/55, pulse 66, temperature 97.6 F (36.4 C), temperature source Tympanic, resp. rate 20, height 5' 6.5" (1.689 m), weight 165 lb 6.4 oz (75 kg), SpO2 99 %.  LABORATORY DATA: Lab Results  Component Value Date   WBC 4.2 09/06/2020   HGB 10.9 (L) 09/06/2020   HCT 32.7 (L) 09/06/2020   MCV 90.1 09/06/2020   PLT 88 (L) 09/06/2020      Chemistry      Component Value Date/Time   NA 140 08/30/2020 1334   NA 138 11/14/2016 0824   K 3.7 08/30/2020 1334   K 4.3 11/14/2016 0824   CL 106 08/30/2020 1334   CL 104 04/07/2012 1415   CO2 24 08/30/2020 1334   CO2 23 11/14/2016 0824   BUN 6 08/30/2020 1334   BUN 9.1 11/14/2016 0824   CREATININE 0.79 08/30/2020 1334   CREATININE 0.7 11/14/2016 0824      Component Value Date/Time   CALCIUM 9.3 08/30/2020 1334   CALCIUM 9.6 11/14/2016 0824   ALKPHOS 127 (H) 08/30/2020 1334   ALKPHOS 94 11/14/2016 0824   AST 52 (H) 08/30/2020 1334   AST 28 11/14/2016 0824   ALT 47  (H) 08/30/2020 1334   ALT 24 11/14/2016 0824   BILITOT 0.8 08/30/2020 1334   BILITOT 0.41 11/14/2016 0824      ASSESSMENT AND PLAN:  This is a very pleasant 60 years old white female with smoldering multiple myeloma with questionable POEMS syndrome symptom.  The patient has been on treatment with subcutaneous weekly Velcade as well as Revlimid and Decadron status post 34 cycles.  She has been tolerating this treatment well. The patient has been on observation for the last several months and she has been doing fine with no concerning issues except for the recent upper respiratory infection and viral gastroenteritis. Her myeloma panel showed continuous increase in her free lambda light chain. The patient had a bone marrow biopsy and aspirate recently that showed 3-5% plasma cells still suspicious for plasma cell dyscrasia.  The skeletal bone survey was negative for any lytic lesions. The patient has been off treatment for more than a year between June 2021 until July 2022. She had repeat myeloma panel performed recently that showed significant worsening and increase of her lambda light chain.  She continues to have anemia but no renal insufficiency or hypercalcemia. She resumed her treatment with Velcade, Revlimid and Decadron on July 19, 2020.  She tolerated the last 2 cycles of her treatment fairly well. The patient is feeling fine today and I recommended for her to proceed with her treatment today as planned. I will see her back for follow-up visit in 4 weeks with repeat myeloma panel 1 week before the visit. For the iron deficiency anemia, I will give her a refill of Integra +1 capsule p.o. daily. She was advised to call immediately if she has any other concerning symptoms in the interval. All questions were answered. The patient knows to call the clinic with any problems, questions or concerns. We can certainly see the patient much sooner if necessary.  Disclaimer: This note was  dictated  with voice recognition software. Similar sounding words can inadvertently be transcribed and may not be corrected upon review.

## 2020-09-06 NOTE — Patient Instructions (Signed)
[facility]  Discharge Instructions: Thank you for choosing [facility] to provide your oncology and hematology care.   If you have a lab appointment with the Cancer Center, please go directly to the Cancer Center and check in at the registration area.   Wear comfortable clothing and clothing appropriate for easy access to any Portacath or PICC line.   We strive to give you quality time with your provider. You may need to reschedule your appointment if you arrive late (15 or more minutes).  Arriving late affects you and other patients whose appointments are after yours.  Also, if you miss three or more appointments without notifying the office, you may be dismissed from the clinic at the provider's discretion.      For prescription refill requests, have your pharmacy contact our office and allow 72 hours for refills to be completed.    Today you received the following chemotherapy and/or immunotherapy agents Velcade       To help prevent nausea and vomiting after your treatment, we encourage you to take your nausea medication as directed.  BELOW ARE SYMPTOMS THAT SHOULD BE REPORTED IMMEDIATELY: *FEVER GREATER THAN 100.4 F (38 C) OR HIGHER *CHILLS OR SWEATING *NAUSEA AND VOMITING THAT IS NOT CONTROLLED WITH YOUR NAUSEA MEDICATION *UNUSUAL SHORTNESS OF BREATH *UNUSUAL BRUISING OR BLEEDING *URINARY PROBLEMS (pain or burning when urinating, or frequent urination) *BOWEL PROBLEMS (unusual diarrhea, constipation, pain near the anus) TENDERNESS IN MOUTH AND THROAT WITH OR WITHOUT PRESENCE OF ULCERS (sore throat, sores in mouth, or a toothache) UNUSUAL RASH, SWELLING OR PAIN  UNUSUAL VAGINAL DISCHARGE OR ITCHING   Items with * indicate a potential emergency and should be followed up as soon as possible or go to the Emergency Department if any problems should occur.  Please show the CHEMOTHERAPY ALERT CARD or IMMUNOTHERAPY ALERT CARD at check-in to  the Emergency Department and triage nurse.  Should you have questions after your visit or need to cancel or reschedule your appointment, please contact [facility]  Dept: [phone]  and follow the prompts.  Office hours are 8:00 a.m. to 4:30 p.m. Monday - Friday. Please note that voicemails left after 4:00 p.m. may not be returned until the following business day.  We are closed weekends and major holidays. You have access to a nurse at all times for urgent questions. Please call the main number to the clinic Dept: [phone] and follow the prompts.   For any non-urgent questions, you may also contact your provider using MyChart. We now offer e-Visits for anyone 18 and older to request care online for non-urgent symptoms. For details visit [email or web address].   Also download the MyChart app! Go to the app store, search "MyChart", open the app, select , and log in with your MyChart username and password.  Due to Covid, a mask is required upon entering the hospital/clinic. If you do not have a mask, one will be given to you upon arrival. For doctor visits, patients may have 1 support person aged 18 or older with them. For treatment visits, patients cannot have anyone with them due to current Covid guidelines and our immunocompromised population.   

## 2020-09-07 ENCOUNTER — Other Ambulatory Visit: Payer: Self-pay | Admitting: Internal Medicine

## 2020-09-07 ENCOUNTER — Encounter: Payer: Self-pay | Admitting: Internal Medicine

## 2020-09-07 DIAGNOSIS — C9000 Multiple myeloma not having achieved remission: Secondary | ICD-10-CM

## 2020-09-13 ENCOUNTER — Other Ambulatory Visit: Payer: Self-pay

## 2020-09-13 ENCOUNTER — Inpatient Hospital Stay: Payer: 59

## 2020-09-13 ENCOUNTER — Inpatient Hospital Stay: Payer: 59 | Attending: Physician Assistant

## 2020-09-13 VITALS — BP 136/64 | HR 61 | Temp 98.2°F | Resp 18 | Wt 265.0 lb

## 2020-09-13 DIAGNOSIS — Z885 Allergy status to narcotic agent status: Secondary | ICD-10-CM | POA: Diagnosis not present

## 2020-09-13 DIAGNOSIS — Z79899 Other long term (current) drug therapy: Secondary | ICD-10-CM | POA: Diagnosis not present

## 2020-09-13 DIAGNOSIS — Z7952 Long term (current) use of systemic steroids: Secondary | ICD-10-CM | POA: Insufficient documentation

## 2020-09-13 DIAGNOSIS — R140 Abdominal distension (gaseous): Secondary | ICD-10-CM | POA: Diagnosis not present

## 2020-09-13 DIAGNOSIS — G629 Polyneuropathy, unspecified: Secondary | ICD-10-CM | POA: Diagnosis not present

## 2020-09-13 DIAGNOSIS — C9030 Solitary plasmacytoma not having achieved remission: Secondary | ICD-10-CM | POA: Diagnosis present

## 2020-09-13 DIAGNOSIS — Z9049 Acquired absence of other specified parts of digestive tract: Secondary | ICD-10-CM | POA: Diagnosis not present

## 2020-09-13 DIAGNOSIS — Z5112 Encounter for antineoplastic immunotherapy: Secondary | ICD-10-CM | POA: Diagnosis present

## 2020-09-13 DIAGNOSIS — Z880 Allergy status to penicillin: Secondary | ICD-10-CM | POA: Diagnosis not present

## 2020-09-13 DIAGNOSIS — I10 Essential (primary) hypertension: Secondary | ICD-10-CM | POA: Insufficient documentation

## 2020-09-13 DIAGNOSIS — C9000 Multiple myeloma not having achieved remission: Secondary | ICD-10-CM

## 2020-09-13 LAB — CBC WITH DIFFERENTIAL (CANCER CENTER ONLY)
Abs Immature Granulocytes: 0.02 10*3/uL (ref 0.00–0.07)
Basophils Absolute: 0.1 10*3/uL (ref 0.0–0.1)
Basophils Relative: 2 %
Eosinophils Absolute: 0.2 10*3/uL (ref 0.0–0.5)
Eosinophils Relative: 7 %
HCT: 31.9 % — ABNORMAL LOW (ref 36.0–46.0)
Hemoglobin: 10.7 g/dL — ABNORMAL LOW (ref 12.0–15.0)
Immature Granulocytes: 1 %
Lymphocytes Relative: 20 %
Lymphs Abs: 0.6 10*3/uL — ABNORMAL LOW (ref 0.7–4.0)
MCH: 30.5 pg (ref 26.0–34.0)
MCHC: 33.5 g/dL (ref 30.0–36.0)
MCV: 90.9 fL (ref 80.0–100.0)
Monocytes Absolute: 0.3 10*3/uL (ref 0.1–1.0)
Monocytes Relative: 9 %
Neutro Abs: 1.9 10*3/uL (ref 1.7–7.7)
Neutrophils Relative %: 61 %
Platelet Count: 98 10*3/uL — ABNORMAL LOW (ref 150–400)
RBC: 3.51 MIL/uL — ABNORMAL LOW (ref 3.87–5.11)
RDW: 15.6 % — ABNORMAL HIGH (ref 11.5–15.5)
WBC Count: 3 10*3/uL — ABNORMAL LOW (ref 4.0–10.5)
nRBC: 0 % (ref 0.0–0.2)

## 2020-09-13 LAB — CMP (CANCER CENTER ONLY)
ALT: 55 U/L — ABNORMAL HIGH (ref 0–44)
AST: 55 U/L — ABNORMAL HIGH (ref 15–41)
Albumin: 3.6 g/dL (ref 3.5–5.0)
Alkaline Phosphatase: 139 U/L — ABNORMAL HIGH (ref 38–126)
Anion gap: 11 (ref 5–15)
BUN: 7 mg/dL (ref 6–20)
CO2: 21 mmol/L — ABNORMAL LOW (ref 22–32)
Calcium: 8.9 mg/dL (ref 8.9–10.3)
Chloride: 105 mmol/L (ref 98–111)
Creatinine: 0.9 mg/dL (ref 0.44–1.00)
GFR, Estimated: >60 mL/min (ref >60)
Glucose, Bld: 226 mg/dL — ABNORMAL HIGH (ref 70–99)
Potassium: 3.4 mmol/L — ABNORMAL LOW (ref 3.5–5.1)
Sodium: 137 mmol/L (ref 135–145)
Total Bilirubin: 0.8 mg/dL (ref 0.3–1.2)
Total Protein: 6.9 g/dL (ref 6.5–8.1)

## 2020-09-13 MED ORDER — ONDANSETRON HCL 8 MG PO TABS
8.0000 mg | ORAL_TABLET | Freq: Once | ORAL | Status: AC
  Administered 2020-09-13: 8 mg via ORAL
  Filled 2020-09-13: qty 1

## 2020-09-13 MED ORDER — BORTEZOMIB CHEMO SQ INJECTION 3.5 MG (2.5MG/ML)
1.3000 mg/m2 | Freq: Once | INTRAMUSCULAR | Status: AC
  Administered 2020-09-13: 3 mg via SUBCUTANEOUS
  Filled 2020-09-13: qty 1.2

## 2020-09-13 NOTE — Patient Instructions (Signed)
[facility]  Discharge Instructions: Thank you for choosing [facility] to provide your oncology and hematology care.   If you have a lab appointment with the [facility], please go directly to the [facility] and check in at the registration area.   Wear comfortable clothing and clothing appropriate for easy access to any Portacath or PICC line.   We strive to give you quality time with your provider. You may need to reschedule your appointment if you arrive late (15 or more minutes).  Arriving late affects you and other patients whose appointments are after yours.  Also, if you miss three or more appointments without notifying the office, you may be dismissed from the clinic at the provider's discretion.      For prescription refill requests, have your pharmacy contact our office and allow 72 hours for refills to be completed.    Today you received the following chemotherapy and/or immunotherapy agents velcade      To help prevent nausea and vomiting after your treatment, we encourage you to take your nausea medication as directed.  BELOW ARE SYMPTOMS THAT SHOULD BE REPORTED IMMEDIATELY: *FEVER GREATER THAN 100.4 F (38 C) OR HIGHER *CHILLS OR SWEATING *NAUSEA AND VOMITING THAT IS NOT CONTROLLED WITH YOUR NAUSEA MEDICATION *UNUSUAL SHORTNESS OF BREATH *UNUSUAL BRUISING OR BLEEDING *URINARY PROBLEMS (pain or burning when urinating, or frequent urination) *BOWEL PROBLEMS (unusual diarrhea, constipation, pain near the anus) TENDERNESS IN MOUTH AND THROAT WITH OR WITHOUT PRESENCE OF ULCERS (sore throat, sores in mouth, or a toothache) UNUSUAL RASH, SWELLING OR PAIN  UNUSUAL VAGINAL DISCHARGE OR ITCHING   Items with * indicate a potential emergency and should be followed up as soon as possible or go to the Emergency Department if any problems should occur.  Please show the CHEMOTHERAPY ALERT CARD or IMMUNOTHERAPY ALERT CARD at check-in to the  Emergency Department and triage nurse.  Should you have questions after your visit or need to cancel or reschedule your appointment, please contact [facility]  Dept: [phone]  and follow the prompts.  Office hours are 8:00 a.m. to 4:30 p.m. Monday - Friday. Please note that voicemails left after 4:00 p.m. may not be returned until the following business day.  We are closed weekends and major holidays. You have access to a nurse at all times for urgent questions. Please call the main number to the clinic Dept: [phone] and follow the prompts.   For any non-urgent questions, you may also contact your provider using MyChart. We now offer e-Visits for anyone 55 and older to request care online for non-urgent symptoms. For details visit [email or web address].   Also download the MyChart app! Go to the app store, search "MyChart", open the app, select [facility], and log in with your MyChart username and password.  Due to Covid, a mask is required upon entering the hospital/clinic. If you do not have a mask, one will be given to you upon arrival. For doctor visits, patients may have 1 support person aged 52 or older with them. For treatment visits, patients cannot have anyone with them due to current Covid guidelines and our immunocompromised population.

## 2020-09-13 NOTE — Progress Notes (Signed)
Ok to treat with plts of 98 per Dr.Mohamed

## 2020-09-20 ENCOUNTER — Inpatient Hospital Stay: Payer: 59

## 2020-09-20 ENCOUNTER — Other Ambulatory Visit: Payer: Self-pay

## 2020-09-20 VITALS — BP 152/68 | HR 59 | Temp 97.7°F | Resp 18 | Wt 267.5 lb

## 2020-09-20 DIAGNOSIS — C9000 Multiple myeloma not having achieved remission: Secondary | ICD-10-CM

## 2020-09-20 DIAGNOSIS — Z5112 Encounter for antineoplastic immunotherapy: Secondary | ICD-10-CM | POA: Diagnosis not present

## 2020-09-20 LAB — CBC WITH DIFFERENTIAL (CANCER CENTER ONLY)
Abs Immature Granulocytes: 0.01 10*3/uL (ref 0.00–0.07)
Basophils Absolute: 0.1 10*3/uL (ref 0.0–0.1)
Basophils Relative: 2 %
Eosinophils Absolute: 0.1 10*3/uL (ref 0.0–0.5)
Eosinophils Relative: 2 %
HCT: 31.2 % — ABNORMAL LOW (ref 36.0–46.0)
Hemoglobin: 10.5 g/dL — ABNORMAL LOW (ref 12.0–15.0)
Immature Granulocytes: 0 %
Lymphocytes Relative: 21 %
Lymphs Abs: 0.7 10*3/uL (ref 0.7–4.0)
MCH: 30.8 pg (ref 26.0–34.0)
MCHC: 33.7 g/dL (ref 30.0–36.0)
MCV: 91.5 fL (ref 80.0–100.0)
Monocytes Absolute: 0.4 10*3/uL (ref 0.1–1.0)
Monocytes Relative: 11 %
Neutro Abs: 2.2 10*3/uL (ref 1.7–7.7)
Neutrophils Relative %: 64 %
Platelet Count: 110 10*3/uL — ABNORMAL LOW (ref 150–400)
RBC: 3.41 MIL/uL — ABNORMAL LOW (ref 3.87–5.11)
RDW: 15.5 % (ref 11.5–15.5)
WBC Count: 3.5 10*3/uL — ABNORMAL LOW (ref 4.0–10.5)
nRBC: 0 % (ref 0.0–0.2)

## 2020-09-20 LAB — CMP (CANCER CENTER ONLY)
ALT: 41 U/L (ref 0–44)
AST: 43 U/L — ABNORMAL HIGH (ref 15–41)
Albumin: 3.6 g/dL (ref 3.5–5.0)
Alkaline Phosphatase: 128 U/L — ABNORMAL HIGH (ref 38–126)
Anion gap: 11 (ref 5–15)
BUN: 9 mg/dL (ref 6–20)
CO2: 21 mmol/L — ABNORMAL LOW (ref 22–32)
Calcium: 9.2 mg/dL (ref 8.9–10.3)
Chloride: 106 mmol/L (ref 98–111)
Creatinine: 0.78 mg/dL (ref 0.44–1.00)
GFR, Estimated: >60 mL/min (ref >60)
Glucose, Bld: 233 mg/dL — ABNORMAL HIGH (ref 70–99)
Potassium: 3.5 mmol/L (ref 3.5–5.1)
Sodium: 138 mmol/L (ref 135–145)
Total Bilirubin: 0.7 mg/dL (ref 0.3–1.2)
Total Protein: 6.7 g/dL (ref 6.5–8.1)

## 2020-09-20 MED ORDER — ONDANSETRON HCL 8 MG PO TABS
8.0000 mg | ORAL_TABLET | Freq: Once | ORAL | Status: AC
  Administered 2020-09-20: 8 mg via ORAL

## 2020-09-20 MED ORDER — ONDANSETRON HCL 8 MG PO TABS
ORAL_TABLET | ORAL | Status: AC
  Filled 2020-09-20: qty 1

## 2020-09-20 MED ORDER — BORTEZOMIB CHEMO SQ INJECTION 3.5 MG (2.5MG/ML)
1.3000 mg/m2 | Freq: Once | INTRAMUSCULAR | Status: AC
  Administered 2020-09-20: 3 mg via SUBCUTANEOUS
  Filled 2020-09-20: qty 1.2

## 2020-09-20 NOTE — Patient Instructions (Signed)
[facility]  Discharge Instructions: Thank you for choosing [facility] to provide your oncology and hematology care.   If you have a lab appointment with the [facility], please go directly to the [facility] and check in at the registration area.   Wear comfortable clothing and clothing appropriate for easy access to any Portacath or PICC line.   We strive to give you quality time with your provider. You may need to reschedule your appointment if you arrive late (15 or more minutes).  Arriving late affects you and other patients whose appointments are after yours.  Also, if you miss three or more appointments without notifying the office, you may be dismissed from the clinic at the provider's discretion.      For prescription refill requests, have your pharmacy contact our office and allow 72 hours for refills to be completed.    Today you received the following chemotherapy and/or immunotherapy agents velcade      To help prevent nausea and vomiting after your treatment, we encourage you to take your nausea medication as directed.  BELOW ARE SYMPTOMS THAT SHOULD BE REPORTED IMMEDIATELY: *FEVER GREATER THAN 100.4 F (38 C) OR HIGHER *CHILLS OR SWEATING *NAUSEA AND VOMITING THAT IS NOT CONTROLLED WITH YOUR NAUSEA MEDICATION *UNUSUAL SHORTNESS OF BREATH *UNUSUAL BRUISING OR BLEEDING *URINARY PROBLEMS (pain or burning when urinating, or frequent urination) *BOWEL PROBLEMS (unusual diarrhea, constipation, pain near the anus) TENDERNESS IN MOUTH AND THROAT WITH OR WITHOUT PRESENCE OF ULCERS (sore throat, sores in mouth, or a toothache) UNUSUAL RASH, SWELLING OR PAIN  UNUSUAL VAGINAL DISCHARGE OR ITCHING   Items with * indicate a potential emergency and should be followed up as soon as possible or go to the Emergency Department if any problems should occur.  Please show the CHEMOTHERAPY ALERT CARD or IMMUNOTHERAPY ALERT CARD at check-in to the  Emergency Department and triage nurse.  Should you have questions after your visit or need to cancel or reschedule your appointment, please contact [facility]  Dept: [phone]  and follow the prompts.  Office hours are 8:00 a.m. to 4:30 p.m. Monday - Friday. Please note that voicemails left after 4:00 p.m. may not be returned until the following business day.  We are closed weekends and major holidays. You have access to a nurse at all times for urgent questions. Please call the main number to the clinic Dept: [phone] and follow the prompts.   For any non-urgent questions, you may also contact your provider using MyChart. We now offer e-Visits for anyone 55 and older to request care online for non-urgent symptoms. For details visit [email or web address].   Also download the MyChart app! Go to the app store, search "MyChart", open the app, select [facility], and log in with your MyChart username and password.  Due to Covid, a mask is required upon entering the hospital/clinic. If you do not have a mask, one will be given to you upon arrival. For doctor visits, patients may have 1 support person aged 52 or older with them. For treatment visits, patients cannot have anyone with them due to current Covid guidelines and our immunocompromised population.

## 2020-09-27 ENCOUNTER — Other Ambulatory Visit: Payer: Self-pay

## 2020-09-27 ENCOUNTER — Inpatient Hospital Stay: Payer: 59

## 2020-09-27 VITALS — BP 115/44 | HR 69 | Temp 98.4°F | Resp 18 | Wt 261.5 lb

## 2020-09-27 DIAGNOSIS — Z5112 Encounter for antineoplastic immunotherapy: Secondary | ICD-10-CM | POA: Diagnosis not present

## 2020-09-27 DIAGNOSIS — C9000 Multiple myeloma not having achieved remission: Secondary | ICD-10-CM

## 2020-09-27 LAB — CMP (CANCER CENTER ONLY)
ALT: 44 U/L (ref 0–44)
AST: 46 U/L — ABNORMAL HIGH (ref 15–41)
Albumin: 3.8 g/dL (ref 3.5–5.0)
Alkaline Phosphatase: 135 U/L — ABNORMAL HIGH (ref 38–126)
Anion gap: 13 (ref 5–15)
BUN: 8 mg/dL (ref 6–20)
CO2: 21 mmol/L — ABNORMAL LOW (ref 22–32)
Calcium: 9.2 mg/dL (ref 8.9–10.3)
Chloride: 101 mmol/L (ref 98–111)
Creatinine: 0.89 mg/dL (ref 0.44–1.00)
GFR, Estimated: >60 mL/min (ref >60)
Glucose, Bld: 251 mg/dL — ABNORMAL HIGH (ref 70–99)
Potassium: 3.8 mmol/L (ref 3.5–5.1)
Sodium: 135 mmol/L (ref 135–145)
Total Bilirubin: 0.9 mg/dL (ref 0.3–1.2)
Total Protein: 7.1 g/dL (ref 6.5–8.1)

## 2020-09-27 LAB — CBC WITH DIFFERENTIAL (CANCER CENTER ONLY)
Abs Immature Granulocytes: 0.02 10*3/uL (ref 0.00–0.07)
Basophils Absolute: 0 10*3/uL (ref 0.0–0.1)
Basophils Relative: 1 %
Eosinophils Absolute: 0.1 10*3/uL (ref 0.0–0.5)
Eosinophils Relative: 3 %
HCT: 33.7 % — ABNORMAL LOW (ref 36.0–46.0)
Hemoglobin: 11 g/dL — ABNORMAL LOW (ref 12.0–15.0)
Immature Granulocytes: 1 %
Lymphocytes Relative: 16 %
Lymphs Abs: 0.6 10*3/uL — ABNORMAL LOW (ref 0.7–4.0)
MCH: 30 pg (ref 26.0–34.0)
MCHC: 32.6 g/dL (ref 30.0–36.0)
MCV: 91.8 fL (ref 80.0–100.0)
Monocytes Absolute: 0.2 10*3/uL (ref 0.1–1.0)
Monocytes Relative: 5 %
Neutro Abs: 2.7 10*3/uL (ref 1.7–7.7)
Neutrophils Relative %: 74 %
Platelet Count: 117 10*3/uL — ABNORMAL LOW (ref 150–400)
RBC: 3.67 MIL/uL — ABNORMAL LOW (ref 3.87–5.11)
RDW: 15.6 % — ABNORMAL HIGH (ref 11.5–15.5)
WBC Count: 3.7 10*3/uL — ABNORMAL LOW (ref 4.0–10.5)
nRBC: 0 % (ref 0.0–0.2)

## 2020-09-27 LAB — LACTATE DEHYDROGENASE: LDH: 143 U/L (ref 98–192)

## 2020-09-27 MED ORDER — BORTEZOMIB CHEMO SQ INJECTION 3.5 MG (2.5MG/ML)
1.3000 mg/m2 | Freq: Once | INTRAMUSCULAR | Status: AC
  Administered 2020-09-27: 3 mg via SUBCUTANEOUS
  Filled 2020-09-27: qty 1.2

## 2020-09-27 MED ORDER — ONDANSETRON HCL 8 MG PO TABS
8.0000 mg | ORAL_TABLET | Freq: Once | ORAL | Status: AC
  Administered 2020-09-27: 8 mg via ORAL
  Filled 2020-09-27: qty 1

## 2020-09-27 NOTE — Patient Instructions (Signed)
[facility]  Discharge Instructions: Thank you for choosing [facility] to provide your oncology and hematology care.   If you have a lab appointment with the [facility], please go directly to the [facility] and check in at the registration area.   Wear comfortable clothing and clothing appropriate for easy access to any Portacath or PICC line.   We strive to give you quality time with your provider. You may need to reschedule your appointment if you arrive late (15 or more minutes).  Arriving late affects you and other patients whose appointments are after yours.  Also, if you miss three or more appointments without notifying the office, you may be dismissed from the clinic at the provider's discretion.      For prescription refill requests, have your pharmacy contact our office and allow 72 hours for refills to be completed.    Today you received the following chemotherapy and/or immunotherapy agents velcade      To help prevent nausea and vomiting after your treatment, we encourage you to take your nausea medication as directed.  BELOW ARE SYMPTOMS THAT SHOULD BE REPORTED IMMEDIATELY: *FEVER GREATER THAN 100.4 F (38 C) OR HIGHER *CHILLS OR SWEATING *NAUSEA AND VOMITING THAT IS NOT CONTROLLED WITH YOUR NAUSEA MEDICATION *UNUSUAL SHORTNESS OF BREATH *UNUSUAL BRUISING OR BLEEDING *URINARY PROBLEMS (pain or burning when urinating, or frequent urination) *BOWEL PROBLEMS (unusual diarrhea, constipation, pain near the anus) TENDERNESS IN MOUTH AND THROAT WITH OR WITHOUT PRESENCE OF ULCERS (sore throat, sores in mouth, or a toothache) UNUSUAL RASH, SWELLING OR PAIN  UNUSUAL VAGINAL DISCHARGE OR ITCHING   Items with * indicate a potential emergency and should be followed up as soon as possible or go to the Emergency Department if any problems should occur.  Please show the CHEMOTHERAPY ALERT CARD or IMMUNOTHERAPY ALERT CARD at check-in to the  Emergency Department and triage nurse.  Should you have questions after your visit or need to cancel or reschedule your appointment, please contact [facility]  Dept: [phone]  and follow the prompts.  Office hours are 8:00 a.m. to 4:30 p.m. Monday - Friday. Please note that voicemails left after 4:00 p.m. may not be returned until the following business day.  We are closed weekends and major holidays. You have access to a nurse at all times for urgent questions. Please call the main number to the clinic Dept: [phone] and follow the prompts.   For any non-urgent questions, you may also contact your provider using MyChart. We now offer e-Visits for anyone 55 and older to request care online for non-urgent symptoms. For details visit [email or web address].   Also download the MyChart app! Go to the app store, search "MyChart", open the app, select [facility], and log in with your MyChart username and password.  Due to Covid, a mask is required upon entering the hospital/clinic. If you do not have a mask, one will be given to you upon arrival. For doctor visits, patients may have 1 support person aged 52 or older with them. For treatment visits, patients cannot have anyone with them due to current Covid guidelines and our immunocompromised population.

## 2020-09-28 LAB — KAPPA/LAMBDA LIGHT CHAINS
Kappa free light chain: 17.1 mg/L (ref 3.3–19.4)
Kappa, lambda light chain ratio: 0.02 — ABNORMAL LOW (ref 0.26–1.65)
Lambda free light chains: 905.3 mg/L — ABNORMAL HIGH (ref 5.7–26.3)

## 2020-09-28 LAB — BETA 2 MICROGLOBULIN, SERUM: Beta-2 Microglobulin: 1.9 mg/L (ref 0.6–2.4)

## 2020-09-29 LAB — IGG, IGA, IGM
IgA: 125 mg/dL (ref 87–352)
IgG (Immunoglobin G), Serum: 457 mg/dL — ABNORMAL LOW (ref 586–1602)
IgM (Immunoglobulin M), Srm: 712 mg/dL — ABNORMAL HIGH (ref 26–217)

## 2020-09-29 NOTE — Progress Notes (Signed)
[facility] OFFICE PROGRESS NOTE  [name], MD [place] [address]  DIAGNOSIS: Plasma cell dyscrasia initially diagnosed as MGUS in September 2010, with additional symptoms suggestive of POEMS syndrome.  PRIOR THERAPY: 1) Velcade 1.3 MG/M2 subcutaneously with Decadron 40 mg by mouth on a weekly basis. First cycle 11/24/2013. She status post 31 weekly doses of treatment. 2) Velcade 1.3 MG/M2 subcutaneously and weekly basis with Decadron 40 mg by mouth weekly. First dose 02/01/2015. Status post 28 cycles. 3) Revlimid 25 mg by mouth daily for 21 days every 4 weeks with weekly Decadron 20 mg. started in 11/27/2015. Status post 3 cycles discontinued secondary to lack of response.  CURRENT THERAPY: Systemic treatment with Velcade 1.3 MG/KG weekly, Revlimid 25 mg by mouth daily for 21 days every 4 weeks in addition to Decadron 20 mg by mouth weekly. First dose 03/06/2016. Status post 32 cycles.  She has a break off treatment from June 2021 until July 2022. Resumed July 19, 2020. Status post 10 additional cycles.   INTERVAL HISTORY: [facility] 60 y.o. female returns to the clinic today for a follow-up visit.  The patient is feeling well today.  She was recently found to have disease progression in July 2022.  Therefore, she restarted her treatment with Revlimid, Velcade, and Decadron. She has been tolerating treatment well.  She is going out of town next week to [employer] and will skip her treatment next week. She denies any fever, chills, night sweats, or unexplained weight loss.  She denies any nausea, vomiting, diarrhea, or constipation.  She stopped taking her iron supplement because it was causing abdominal discomfort and bloating. She has stable peripheral neuropathy but denies changes from her baseline. She denies any abnormal bleeding or bruising.  She denies any signs and symptoms of infection. She recently had a repeat myeloma panel performed. She  is here today for evaluation and repeat blood work before starting her next cycle of velcade.       MEDICAL HISTORY: Past Medical History:  Diagnosis Date   Acid reflux    Anxiety    Asthma    Cancer ([facility])    waldenstroms/ macroglobinulemia   Depression    Depression    Diabetes mellitus without complication ([facility])    Dysuria 02/28/2016   Hypercholesteremia    Hypertension    Hypothyroidism    Macroglobulinemia ([facility])    ? POEMS syndrome   Multiple myeloma ([facility])    Obesity    PONV (postoperative nausea and vomiting)    Sleep apnea    CPAP at bedtime   Sleep apnea     ALLERGIES:  is allergic to codeine, hydrocodone, lortab [hydrocodone-acetaminophen], onion, shellfish allergy, and amoxicillin.  MEDICATIONS:  Current Outpatient Medications  Medication Sig Dispense Refill   ACCU-CHEK AVIVA PLUS test strip USE TO CHECK SUGAR TWICE A DAY 90     acyclovir (ZOVIRAX) 400 MG tablet Take 1 tablet (400 mg total) by mouth 2 (two) times daily. 180 tablet 1   ALPRAZolam (XANAX) 0.5 MG tablet Take 1 tablet (0.5 mg total) by mouth at bedtime as needed for anxiety. 30 tablet 1   aspirin 81 MG chewable tablet Chew by mouth daily.     Azelastine HCl 0.15 % SOLN as needed.     B-D UF III MINI PEN NEEDLES 31G X 5 MM MISC      Blood Glucose Monitoring Suppl (ONE TOUCH ULTRA MINI) W/DEVICE KIT See admin  instructions. Reported on 01/25/2015  0   bortezomib IV (VELCADE) 3.5 MG injection Inject 1.3 mg/m2 into the vein once.     Cetirizine HCl (ZYRTEC ALLERGY PO) Take by mouth daily.     dexamethasone (DECADRON) 4 MG tablet 5 tablets p.o. weekly on the day of chemotherapy. 40 tablet 2   DiphenhydrAMINE HCl (BENADRYL ALLERGY PO) Take by mouth as needed.     EPIPEN 2-PAK 0.3 MG/0.3ML SOAJ injection Reported on 06/21/2015  1   FeFum-FePoly-FA-B Cmp-C-Biot (INTEGRA PLUS) CAPS Take 1 capsule by mouth daily. 30 capsule 2   Fexofenadine HCl (ALLEGRA ALLERGY PO) Take by mouth daily.      Fluticasone-Salmeterol (ADVAIR) 100-50 MCG/DOSE AEPB Inhale 2 puffs into the lungs every 12 (twelve) hours.     ibuprofen (ADVIL,MOTRIN) 100 MG tablet Take 100 mg by mouth every 6 (six) hours as needed. Reported on 05/31/2015     Insulin Glargine (BASAGLAR KWIKPEN) 100 UNIT/ML Inject 20 Units into the skin at bedtime.     lenalidomide (REVLIMID) 25 MG capsule TAKE 1 CAPSULE BY MOUTH ONCE DAILY 21 DAYS ON AND 7 DAYS OFF 21 capsule 0   levothyroxine (SYNTHROID, LEVOTHROID) 150 MCG tablet Take 150 mcg by mouth daily before breakfast.     lisinopril (PRINIVIL,ZESTRIL) 10 MG tablet Take 10 mg by mouth daily. auth number 05/29/2018 [id number]  3   loperamide (IMODIUM) 2 MG capsule Take 2 mg by mouth as needed for diarrhea or loose stools.     magic mouthwash w/lidocaine SOLN Take 5 mLs by mouth 4 (four) times daily as needed for mouth pain. Swish, Gargle, and spit 240 mL 1   metFORMIN (GLUCOPHAGE-XR) 500 MG 24 hr tablet Take 500 mg by mouth daily.     NOVOLOG FLEXPEN 100 UNIT/ML FlexPen Inject 15 Units into the skin in the morning, at noon, and at bedtime. Per Sliding Scale     omeprazole (PRILOSEC) 40 MG capsule Take 40 mg by mouth 2 (two) times daily.      ondansetron (ZOFRAN-ODT) 8 MG disintegrating tablet Take 1 tablet (8 mg total) by mouth every 8 (eight) hours as needed. Reported on 05/31/2015 30 tablet 2   potassium chloride SA (KLOR-CON) 20 MEQ tablet Take 2 tablets (40 mEq total) by mouth 2 (two) times daily. 14 tablet 0   pravastatin (PRAVACHOL) 40 MG tablet Take 40 mg by mouth every evening.      PROAIR HFA 108 (90 Base) MCG/ACT inhaler Reported on 06/21/2015  1   sertraline (ZOLOFT) 50 MG tablet Take 3 tablets (150 mg total) by mouth daily. 270 tablet 3   verapamil (CALAN-SR) 240 MG CR tablet Take 240 mg by mouth 2 (two) times daily.     No current facility-administered medications for this visit.    SURGICAL HISTORY:  Past Surgical History:  Procedure Laterality Date   ABLATION  09/09/2007    HTA and polyp resection   BACK SURGERY  03/08/2004   herniation, L4-L5   Bil Laprascopic knee surgery     BONE MARROW BIOPSY     2011   BONE MARROW BIOPSY  04/08/2012   BONE MARROW BIOPSY  01/2020   CHOLECYSTECTOMY     FOOT SURGERY  01/09/1996   KNEE ARTHROSCOPY W/ MENISCAL REPAIR  10/10 ; 3/11   LAPAROSCOPIC CHOLECYSTECTOMY  01/09/1995   NASAL SINUS SURGERY  01/08/2002   UTERINE FIBROID EMBOLIZATION      REVIEW OF SYSTEMS:   Constitutional: Negative for appetite change, chills, fatigue, fever and unexpected  weight change.  HENT: Negative for mouth sores, nosebleeds, sore throat and trouble swallowing.   Eyes: Negative for eye problems and icterus.  Respiratory: Negative for cough, hemoptysis, shortness of breath and wheezing.   Cardiovascular: Negative for chest pain and leg swelling.  Gastrointestinal: Negative for abdominal pain, constipation, diarrhea, nausea and vomiting.  Genitourinary: Negative for bladder incontinence, difficulty urinating, dysuria, frequency and hematuria.   Musculoskeletal: Negative for back pain, gait problem, neck pain and neck stiffness.  Skin: Negative for itching and rash.  Neurological: Negative for dizziness, extremity weakness, gait problem, headaches, light-headedness and seizures.  Hematological: Negative for adenopathy. Does not bruise/bleed easily.  Psychiatric/Behavioral: Negative for confusion, depression and sleep disturbance. The patient is not nervous/anxious.       PHYSICAL EXAMINATION:  Blood pressure (!) 141/49, pulse 74, temperature 98.1 F (36.7 C), temperature source Oral, resp. rate 20, height 5' 6.5" (1.689 m), weight 264 lb (119.7 kg), SpO2 99 %.  ECOG PERFORMANCE STATUS: 1  Physical Exam  Constitutional: Oriented to person, place, and time and well-developed, well-nourished, and in no distress.  HENT:  Head: Normocephalic and atraumatic.  Mouth/Throat: Oropharynx is clear and moist. No oropharyngeal exudate.  Eyes:  Conjunctivae are normal. Right eye exhibits no discharge. Left eye exhibits no discharge. No scleral icterus.  Neck: Normal range of motion. Neck supple.  Cardiovascular: Normal rate, regular rhythm, normal heart sounds and intact distal pulses.   Pulmonary/Chest: Effort normal and breath sounds normal. No respiratory distress. No wheezes. No rales.  Abdominal: Soft. Bowel sounds are normal. Exhibits no distension and no mass. There is no tenderness.  Musculoskeletal: Normal range of motion. Exhibits no edema.  Lymphadenopathy:    No cervical adenopathy.  Neurological: Alert and oriented to person, place, and time. Exhibits normal muscle tone. Gait normal. Coordination normal.  Skin: Skin is warm and dry. No rash noted. Not diaphoretic. No erythema. No pallor.  Psychiatric: Mood, memory and judgment normal.  Vitals reviewed.  LABORATORY DATA: Lab Results  Component Value Date   WBC 3.9 (L) 10/04/2020   HGB 10.6 (L) 10/04/2020   HCT 32.0 (L) 10/04/2020   MCV 92.0 10/04/2020   PLT 94 (L) 10/04/2020      Chemistry      Component Value Date/Time   NA 137 10/04/2020 1356   NA 138 11/14/2016 0824   K 3.2 (L) 10/04/2020 1356   K 4.3 11/14/2016 0824   CL 104 10/04/2020 1356   CL 104 04/07/2012 1415   CO2 20 (L) 10/04/2020 1356   CO2 23 11/14/2016 0824   BUN 6 10/04/2020 1356   BUN 9.1 11/14/2016 0824   CREATININE 0.80 10/04/2020 1356   CREATININE 0.7 11/14/2016 0824      Component Value Date/Time   CALCIUM 8.9 10/04/2020 1356   CALCIUM 9.6 11/14/2016 0824   ALKPHOS 148 (H) 10/04/2020 1356   ALKPHOS 94 11/14/2016 0824   AST 47 (H) 10/04/2020 1356   AST 28 11/14/2016 0824   ALT 44 10/04/2020 1356   ALT 24 11/14/2016 0824   BILITOT 0.7 10/04/2020 1356   BILITOT 0.41 11/14/2016 0824       RADIOGRAPHIC STUDIES:  No results found.   ASSESSMENT/PLAN:  This is a very pleasant 60 year old Caucasian female with smoldering multiple myeloma with questionable POEMS syndrome.  Her myeloma panel showed continuous increase in her free lambda light chain.    The patient previously underwent 107 cycles of subcutaneous weekly Velcade, as well as Revlimid and Decadron on and off  over the last several years.   She had been off treatment since June 2021 until July 2022 in which she had evidence of disease progression.  Therefore, she was restarted on Velcade, Revlimid, and Decadron.  She has status post 10 cycles since restarting (cycle 107) she tolerated well without any concerning adverse side effects.   Labs reviewed. The patient recently had a repeat myeloma panel performed.  The patient was seen with Dr. Julien Nordmann.  Dr. Julien Nordmann reviewed the patient's myeloma panel and discussed the results with her.  The results showed some improvement in her condition except the IgM increased somewhat. He recommend that she proceed with cycle #116 today as scheduled. She is ok to treat with platelets of 94k.   We will see her back for follow-up visit in 4 weeks for evaluation before starting cycle #120.   She she cannot tolerate the integra plus, advised she can try multivitamin with iron, increasing her dietary intake of iron, prenatal vitamins, or liquid iron.   Her potassium is a little bit low. I will send her a prescription for potassium chloride for 5 days.   Dr. Julien Nordmann discussed that if she wishes to take her COVID 19 booster, to take it on her off week next week. However, she would like to take the vaccine before she leaves for her trip. Given that steroids can effect the COVID-19 vaccine, Dr. Julien Nordmann stated she can skip her decadron this week. Of course, she can skip her dose next week as well since she is not receiving velcade next week due to her vacation.   The patient was advised to call immediately if she has any concerning symptoms in the interval. The patient voices understanding of current disease status and treatment options and is in agreement with the current care  plan. All questions were answered. The patient knows to call the clinic with any problems, questions or concerns. We can certainly see the patient much sooner if necessary.    No orders of the defined types were placed in this encounter.     Myna Freimark L [place], PA-C 10/04/20  ADDENDUM: Hematology/Oncology Attending: I had a face-to-face encounter with the patient today.  I reviewed her records, lab and recommended her care plan.  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  The patient was treated in the past with subcutaneous Velcade, Revlimid and Decadron but she had to break off treatment between June 2021 until July 2022 when she had significant evidence for disease progression.  She resumed her treatment again in July 2022 status post 10 more cycles.  She has been tolerating her treatment well with no concerning complaints except for fatigue. She had repeat myeloma panel performed recently that showed improvement of the free lambda light chain. I recommended for her to continue her treatment as planned and she will receive cycle #116 today. I advised her to hold her Decadron treatment today because she is planning to receive the COVID-vaccine booster dose in 2 days. We will see her back for follow-up visit in 4 weeks for evaluation before the next cycle of her treatment. The patient was advised to call immediately if she has any other concerning symptoms in the interval.  Disclaimer: This note was dictated with voice recognition software. Similar sounding words can inadvertently be transcribed and may be missed upon review. Eilleen Kempf, MD 10/04/20

## 2020-10-04 ENCOUNTER — Encounter: Payer: Self-pay | Admitting: Physician Assistant

## 2020-10-04 ENCOUNTER — Other Ambulatory Visit: Payer: Self-pay

## 2020-10-04 ENCOUNTER — Inpatient Hospital Stay: Payer: 59 | Admitting: Physician Assistant

## 2020-10-04 ENCOUNTER — Inpatient Hospital Stay: Payer: 59

## 2020-10-04 VITALS — BP 141/49 | HR 74 | Temp 98.1°F | Resp 20 | Ht 66.5 in | Wt 264.0 lb

## 2020-10-04 DIAGNOSIS — Z5112 Encounter for antineoplastic immunotherapy: Secondary | ICD-10-CM | POA: Diagnosis not present

## 2020-10-04 DIAGNOSIS — C9000 Multiple myeloma not having achieved remission: Secondary | ICD-10-CM | POA: Diagnosis not present

## 2020-10-04 DIAGNOSIS — E876 Hypokalemia: Secondary | ICD-10-CM

## 2020-10-04 DIAGNOSIS — Z5111 Encounter for antineoplastic chemotherapy: Secondary | ICD-10-CM

## 2020-10-04 LAB — CMP (CANCER CENTER ONLY)
ALT: 44 U/L (ref 0–44)
AST: 47 U/L — ABNORMAL HIGH (ref 15–41)
Albumin: 3.6 g/dL (ref 3.5–5.0)
Alkaline Phosphatase: 148 U/L — ABNORMAL HIGH (ref 38–126)
Anion gap: 13 (ref 5–15)
BUN: 6 mg/dL (ref 6–20)
CO2: 20 mmol/L — ABNORMAL LOW (ref 22–32)
Calcium: 8.9 mg/dL (ref 8.9–10.3)
Chloride: 104 mmol/L (ref 98–111)
Creatinine: 0.8 mg/dL (ref 0.44–1.00)
GFR, Estimated: >60 mL/min (ref >60)
Glucose, Bld: 256 mg/dL — ABNORMAL HIGH (ref 70–99)
Potassium: 3.2 mmol/L — ABNORMAL LOW (ref 3.5–5.1)
Sodium: 137 mmol/L (ref 135–145)
Total Bilirubin: 0.7 mg/dL (ref 0.3–1.2)
Total Protein: 6.7 g/dL (ref 6.5–8.1)

## 2020-10-04 LAB — CBC WITH DIFFERENTIAL (CANCER CENTER ONLY)
Abs Immature Granulocytes: 0.03 10*3/uL (ref 0.00–0.07)
Basophils Absolute: 0 10*3/uL (ref 0.0–0.1)
Basophils Relative: 1 %
Eosinophils Absolute: 0.1 10*3/uL (ref 0.0–0.5)
Eosinophils Relative: 3 %
HCT: 32 % — ABNORMAL LOW (ref 36.0–46.0)
Hemoglobin: 10.6 g/dL — ABNORMAL LOW (ref 12.0–15.0)
Immature Granulocytes: 1 %
Lymphocytes Relative: 15 %
Lymphs Abs: 0.6 10*3/uL — ABNORMAL LOW (ref 0.7–4.0)
MCH: 30.5 pg (ref 26.0–34.0)
MCHC: 33.1 g/dL (ref 30.0–36.0)
MCV: 92 fL (ref 80.0–100.0)
Monocytes Absolute: 0.5 10*3/uL (ref 0.1–1.0)
Monocytes Relative: 12 %
Neutro Abs: 2.7 10*3/uL (ref 1.7–7.7)
Neutrophils Relative %: 68 %
Platelet Count: 94 10*3/uL — ABNORMAL LOW (ref 150–400)
RBC: 3.48 MIL/uL — ABNORMAL LOW (ref 3.87–5.11)
RDW: 15.6 % — ABNORMAL HIGH (ref 11.5–15.5)
WBC Count: 3.9 10*3/uL — ABNORMAL LOW (ref 4.0–10.5)
nRBC: 0 % (ref 0.0–0.2)

## 2020-10-04 MED ORDER — ONDANSETRON HCL 8 MG PO TABS
8.0000 mg | ORAL_TABLET | Freq: Once | ORAL | Status: AC
  Administered 2020-10-04: 8 mg via ORAL

## 2020-10-04 MED ORDER — ONDANSETRON HCL 8 MG PO TABS
ORAL_TABLET | ORAL | Status: AC
  Filled 2020-10-04: qty 1

## 2020-10-04 MED ORDER — BORTEZOMIB CHEMO SQ INJECTION 3.5 MG (2.5MG/ML)
1.3000 mg/m2 | Freq: Once | INTRAMUSCULAR | Status: AC
  Administered 2020-10-04: 3 mg via SUBCUTANEOUS
  Filled 2020-10-04: qty 1.2

## 2020-10-04 NOTE — Patient Instructions (Signed)
[facility]  Discharge Instructions: Thank you for choosing [facility] to provide your oncology and hematology care.   If you have a lab appointment with the [facility], please go directly to the [facility] and check in at the registration area.   Wear comfortable clothing and clothing appropriate for easy access to any Portacath or PICC line.   We strive to give you quality time with your provider. You may need to reschedule your appointment if you arrive late (15 or more minutes).  Arriving late affects you and other patients whose appointments are after yours.  Also, if you miss three or more appointments without notifying the office, you may be dismissed from the clinic at the provider's discretion.      For prescription refill requests, have your pharmacy contact our office and allow 72 hours for refills to be completed.    Today you received the following chemotherapy and/or immunotherapy agents Velcade       To help prevent nausea and vomiting after your treatment, we encourage you to take your nausea medication as directed.  BELOW ARE SYMPTOMS THAT SHOULD BE REPORTED IMMEDIATELY: *FEVER GREATER THAN 100.4 F (38 C) OR HIGHER *CHILLS OR SWEATING *NAUSEA AND VOMITING THAT IS NOT CONTROLLED WITH YOUR NAUSEA MEDICATION *UNUSUAL SHORTNESS OF BREATH *UNUSUAL BRUISING OR BLEEDING *URINARY PROBLEMS (pain or burning when urinating, or frequent urination) *BOWEL PROBLEMS (unusual diarrhea, constipation, pain near the anus) TENDERNESS IN MOUTH AND THROAT WITH OR WITHOUT PRESENCE OF ULCERS (sore throat, sores in mouth, or a toothache) UNUSUAL RASH, SWELLING OR PAIN  UNUSUAL VAGINAL DISCHARGE OR ITCHING   Items with * indicate a potential emergency and should be followed up as soon as possible or go to the Emergency Department if any problems should occur.  Please show the CHEMOTHERAPY ALERT CARD or IMMUNOTHERAPY ALERT CARD at check-in to  the Emergency Department and triage nurse.  Should you have questions after your visit or need to cancel or reschedule your appointment, please contact [facility]  Dept: [phone]  and follow the prompts.  Office hours are 8:00 a.m. to 4:30 p.m. Monday - Friday. Please note that voicemails left after 4:00 p.m. may not be returned until the following business day.  We are closed weekends and major holidays. You have access to a nurse at all times for urgent questions. Please call the main number to the clinic Dept: [phone] and follow the prompts.   For any non-urgent questions, you may also contact your provider using MyChart. We now offer e-Visits for anyone 18 and older to request care online for non-urgent symptoms. For details visit [email or web address].   Also download the MyChart app! Go to the app store, search "MyChart", open the app, select [facility], and log in with your MyChart username and password.  Due to Covid, a mask is required upon entering the hospital/clinic. If you do not have a mask, one will be given to you upon arrival. For doctor visits, patients may have 1 support person aged 18 or older with them. For treatment visits, patients cannot have anyone with them due to current Covid guidelines and our immunocompromised population.   

## 2020-10-06 ENCOUNTER — Other Ambulatory Visit: Payer: Self-pay | Admitting: Internal Medicine

## 2020-10-06 DIAGNOSIS — C9000 Multiple myeloma not having achieved remission: Secondary | ICD-10-CM

## 2020-10-06 MED ORDER — REVLIMID 25 MG PO CAPS: ORAL_CAPSULE | ORAL | 0 refills | Status: DC

## 2020-10-06 NOTE — Addendum Note (Signed)
Addended by: Ardeen Garland on: 10/06/2020 04:05 PM   Modules accepted: Orders

## 2020-10-11 ENCOUNTER — Ambulatory Visit: Payer: 59

## 2020-10-11 ENCOUNTER — Other Ambulatory Visit: Payer: 59

## 2020-10-18 ENCOUNTER — Inpatient Hospital Stay: Payer: 59 | Attending: Physician Assistant

## 2020-10-18 ENCOUNTER — Inpatient Hospital Stay: Payer: 59

## 2020-10-18 ENCOUNTER — Other Ambulatory Visit: Payer: Self-pay

## 2020-10-18 VITALS — BP 141/57 | HR 60 | Temp 98.1°F | Resp 20 | Wt 267.0 lb

## 2020-10-18 DIAGNOSIS — R232 Flushing: Secondary | ICD-10-CM | POA: Diagnosis not present

## 2020-10-18 DIAGNOSIS — C9000 Multiple myeloma not having achieved remission: Secondary | ICD-10-CM

## 2020-10-18 DIAGNOSIS — Z880 Allergy status to penicillin: Secondary | ICD-10-CM | POA: Diagnosis not present

## 2020-10-18 DIAGNOSIS — Z79899 Other long term (current) drug therapy: Secondary | ICD-10-CM | POA: Diagnosis not present

## 2020-10-18 DIAGNOSIS — Z7961 Long term (current) use of immunomodulator: Secondary | ICD-10-CM | POA: Insufficient documentation

## 2020-10-18 DIAGNOSIS — R110 Nausea: Secondary | ICD-10-CM | POA: Diagnosis not present

## 2020-10-18 DIAGNOSIS — Z9049 Acquired absence of other specified parts of digestive tract: Secondary | ICD-10-CM | POA: Insufficient documentation

## 2020-10-18 DIAGNOSIS — C9030 Solitary plasmacytoma not having achieved remission: Secondary | ICD-10-CM | POA: Diagnosis present

## 2020-10-18 DIAGNOSIS — G629 Polyneuropathy, unspecified: Secondary | ICD-10-CM | POA: Diagnosis not present

## 2020-10-18 DIAGNOSIS — Z7952 Long term (current) use of systemic steroids: Secondary | ICD-10-CM | POA: Diagnosis present

## 2020-10-18 DIAGNOSIS — Z5112 Encounter for antineoplastic immunotherapy: Secondary | ICD-10-CM | POA: Insufficient documentation

## 2020-10-18 DIAGNOSIS — Z885 Allergy status to narcotic agent status: Secondary | ICD-10-CM | POA: Diagnosis not present

## 2020-10-18 LAB — CMP (CANCER CENTER ONLY)
ALT: 25 U/L (ref 0–44)
AST: 39 U/L (ref 15–41)
Albumin: 3.7 g/dL (ref 3.5–5.0)
Alkaline Phosphatase: 163 U/L — ABNORMAL HIGH (ref 38–126)
Anion gap: 8 (ref 5–15)
BUN: 7 mg/dL (ref 6–20)
CO2: 22 mmol/L (ref 22–32)
Calcium: 9.1 mg/dL (ref 8.9–10.3)
Chloride: 107 mmol/L (ref 98–111)
Creatinine: 0.81 mg/dL (ref 0.44–1.00)
GFR, Estimated: >60 mL/min (ref >60)
Glucose, Bld: 176 mg/dL — ABNORMAL HIGH (ref 70–99)
Potassium: 3.5 mmol/L (ref 3.5–5.1)
Sodium: 137 mmol/L (ref 135–145)
Total Bilirubin: 0.7 mg/dL (ref 0.3–1.2)
Total Protein: 7 g/dL (ref 6.5–8.1)

## 2020-10-18 LAB — CBC WITH DIFFERENTIAL (CANCER CENTER ONLY)
Abs Immature Granulocytes: 0 10*3/uL (ref 0.00–0.07)
Basophils Absolute: 0.1 10*3/uL (ref 0.0–0.1)
Basophils Relative: 3 %
Eosinophils Absolute: 0.1 10*3/uL (ref 0.0–0.5)
Eosinophils Relative: 3 %
HCT: 30.7 % — ABNORMAL LOW (ref 36.0–46.0)
Hemoglobin: 10.1 g/dL — ABNORMAL LOW (ref 12.0–15.0)
Immature Granulocytes: 0 %
Lymphocytes Relative: 24 %
Lymphs Abs: 0.6 10*3/uL — ABNORMAL LOW (ref 0.7–4.0)
MCH: 30.3 pg (ref 26.0–34.0)
MCHC: 32.9 g/dL (ref 30.0–36.0)
MCV: 92.2 fL (ref 80.0–100.0)
Monocytes Absolute: 0.3 10*3/uL (ref 0.1–1.0)
Monocytes Relative: 13 %
Neutro Abs: 1.5 10*3/uL — ABNORMAL LOW (ref 1.7–7.7)
Neutrophils Relative %: 57 %
Platelet Count: 125 10*3/uL — ABNORMAL LOW (ref 150–400)
RBC: 3.33 MIL/uL — ABNORMAL LOW (ref 3.87–5.11)
RDW: 15.3 % (ref 11.5–15.5)
WBC Count: 2.6 10*3/uL — ABNORMAL LOW (ref 4.0–10.5)
nRBC: 0 % (ref 0.0–0.2)

## 2020-10-18 MED ORDER — BORTEZOMIB CHEMO SQ INJECTION 3.5 MG (2.5MG/ML)
1.3000 mg/m2 | Freq: Once | INTRAMUSCULAR | Status: AC
  Administered 2020-10-18: 3 mg via SUBCUTANEOUS
  Filled 2020-10-18: qty 1.2

## 2020-10-18 MED ORDER — ONDANSETRON HCL 8 MG PO TABS
8.0000 mg | ORAL_TABLET | Freq: Once | ORAL | Status: AC
  Administered 2020-10-18: 8 mg via ORAL
  Filled 2020-10-18: qty 1

## 2020-10-18 NOTE — Patient Instructions (Signed)
[facility]   Discharge Instructions: Thank you for choosing [facility] to provide your oncology and hematology care.   If you have a lab appointment with the [facility], please go directly to the [facility] and check in at the registration area.   Wear comfortable clothing and clothing appropriate for easy access to any Portacath or PICC line.   We strive to give you quality time with your provider. You may need to reschedule your appointment if you arrive late (15 or more minutes).  Arriving late affects you and other patients whose appointments are after yours.  Also, if you miss three or more appointments without notifying the office, you may be dismissed from the clinic at the provider's discretion.      For prescription refill requests, have your pharmacy contact our office and allow 72 hours for refills to be completed.    Today you received the following chemotherapy and/or immunotherapy agents: bortezomib SQ.      To help prevent nausea and vomiting after your treatment, we encourage you to take your nausea medication as directed.  BELOW ARE SYMPTOMS THAT SHOULD BE REPORTED IMMEDIATELY: *FEVER GREATER THAN 100.4 F (38 C) OR HIGHER *CHILLS OR SWEATING *NAUSEA AND VOMITING THAT IS NOT CONTROLLED WITH YOUR NAUSEA MEDICATION *UNUSUAL SHORTNESS OF BREATH *UNUSUAL BRUISING OR BLEEDING *URINARY PROBLEMS (pain or burning when urinating, or frequent urination) *BOWEL PROBLEMS (unusual diarrhea, constipation, pain near the anus) TENDERNESS IN MOUTH AND THROAT WITH OR WITHOUT PRESENCE OF ULCERS (sore throat, sores in mouth, or a toothache) UNUSUAL RASH, SWELLING OR PAIN  UNUSUAL VAGINAL DISCHARGE OR ITCHING   Items with * indicate a potential emergency and should be followed up as soon as possible or go to the Emergency Department if any problems should occur.  Please show the CHEMOTHERAPY ALERT CARD or IMMUNOTHERAPY ALERT CARD at  check-in to the Emergency Department and triage nurse.  Should you have questions after your visit or need to cancel or reschedule your appointment, please contact [facility]  Dept: [phone]  and follow the prompts.  Office hours are 8:00 a.m. to 4:30 p.m. Monday - Friday. Please note that voicemails left after 4:00 p.m. may not be returned until the following business day.  We are closed weekends and major holidays. You have access to a nurse at all times for urgent questions. Please call the main number to the clinic Dept: [phone] and follow the prompts.   For any non-urgent questions, you may also contact your provider using MyChart. We now offer e-Visits for anyone 64 and older to request care online for non-urgent symptoms. For details visit [email or web address].   Also download the MyChart app! Go to the app store, search "MyChart", open the app, select [facility], and log in with your MyChart username and password.  Due to Covid, a mask is required upon entering the hospital/clinic. If you do not have a mask, one will be given to you upon arrival. For doctor visits, patients may have 1 support person aged 42 or older with them. For treatment visits, patients cannot have anyone with them due to current Covid guidelines and our immunocompromised population.

## 2020-10-25 ENCOUNTER — Inpatient Hospital Stay: Payer: 59

## 2020-10-25 ENCOUNTER — Telehealth: Payer: Self-pay | Admitting: Physician Assistant

## 2020-10-25 ENCOUNTER — Other Ambulatory Visit: Payer: Self-pay

## 2020-10-25 ENCOUNTER — Other Ambulatory Visit: Payer: Self-pay | Admitting: Physician Assistant

## 2020-10-25 VITALS — BP 132/62 | HR 61 | Temp 98.3°F | Resp 16 | Ht 66.0 in | Wt 263.8 lb

## 2020-10-25 DIAGNOSIS — Z5112 Encounter for antineoplastic immunotherapy: Secondary | ICD-10-CM | POA: Diagnosis not present

## 2020-10-25 DIAGNOSIS — E876 Hypokalemia: Secondary | ICD-10-CM

## 2020-10-25 DIAGNOSIS — C9000 Multiple myeloma not having achieved remission: Secondary | ICD-10-CM

## 2020-10-25 LAB — CMP (CANCER CENTER ONLY)
ALT: 19 U/L (ref 0–44)
AST: 31 U/L (ref 15–41)
Albumin: 3.6 g/dL (ref 3.5–5.0)
Alkaline Phosphatase: 125 U/L (ref 38–126)
Anion gap: 12 (ref 5–15)
BUN: 7 mg/dL (ref 6–20)
CO2: 21 mmol/L — ABNORMAL LOW (ref 22–32)
Calcium: 9 mg/dL (ref 8.9–10.3)
Chloride: 105 mmol/L (ref 98–111)
Creatinine: 0.76 mg/dL (ref 0.44–1.00)
GFR, Estimated: >60 mL/min (ref >60)
Glucose, Bld: 159 mg/dL — ABNORMAL HIGH (ref 70–99)
Potassium: 3.1 mmol/L — ABNORMAL LOW (ref 3.5–5.1)
Sodium: 138 mmol/L (ref 135–145)
Total Bilirubin: 0.6 mg/dL (ref 0.3–1.2)
Total Protein: 6.9 g/dL (ref 6.5–8.1)

## 2020-10-25 LAB — CBC WITH DIFFERENTIAL (CANCER CENTER ONLY)
Abs Immature Granulocytes: 0.01 10*3/uL (ref 0.00–0.07)
Basophils Absolute: 0.1 10*3/uL (ref 0.0–0.1)
Basophils Relative: 2 %
Eosinophils Absolute: 0.2 10*3/uL (ref 0.0–0.5)
Eosinophils Relative: 6 %
HCT: 31.7 % — ABNORMAL LOW (ref 36.0–46.0)
Hemoglobin: 10.5 g/dL — ABNORMAL LOW (ref 12.0–15.0)
Immature Granulocytes: 0 %
Lymphocytes Relative: 22 %
Lymphs Abs: 0.7 10*3/uL (ref 0.7–4.0)
MCH: 30 pg (ref 26.0–34.0)
MCHC: 33.1 g/dL (ref 30.0–36.0)
MCV: 90.6 fL (ref 80.0–100.0)
Monocytes Absolute: 0.2 10*3/uL (ref 0.1–1.0)
Monocytes Relative: 7 %
Neutro Abs: 1.9 10*3/uL (ref 1.7–7.7)
Neutrophils Relative %: 63 %
Platelet Count: 104 10*3/uL — ABNORMAL LOW (ref 150–400)
RBC: 3.5 MIL/uL — ABNORMAL LOW (ref 3.87–5.11)
RDW: 14.9 % (ref 11.5–15.5)
WBC Count: 3.1 10*3/uL — ABNORMAL LOW (ref 4.0–10.5)
nRBC: 0 % (ref 0.0–0.2)

## 2020-10-25 MED ORDER — BORTEZOMIB CHEMO SQ INJECTION 3.5 MG (2.5MG/ML)
1.3000 mg/m2 | Freq: Once | INTRAMUSCULAR | Status: AC
  Administered 2020-10-25: 3 mg via SUBCUTANEOUS
  Filled 2020-10-25: qty 1.2

## 2020-10-25 MED ORDER — ONDANSETRON HCL 8 MG PO TABS
8.0000 mg | ORAL_TABLET | Freq: Once | ORAL | Status: AC
  Administered 2020-10-25: 8 mg via ORAL
  Filled 2020-10-25: qty 1

## 2020-10-25 MED ORDER — POTASSIUM CHLORIDE CRYS ER 20 MEQ PO TBCR: 20.0000 meq | EXTENDED_RELEASE_TABLET | Freq: Every day | ORAL | 0 refills | Status: DC

## 2020-10-25 NOTE — Patient Instructions (Signed)
[facility]  Discharge Instructions: Thank you for choosing [facility] to provide your oncology and hematology care.   If you have a lab appointment with the [facility], please go directly to the [facility] and check in at the registration area.   Wear comfortable clothing and clothing appropriate for easy access to any Portacath or PICC line.   We strive to give you quality time with your provider. You may need to reschedule your appointment if you arrive late (15 or more minutes).  Arriving late affects you and other patients whose appointments are after yours.  Also, if you miss three or more appointments without notifying the office, you may be dismissed from the clinic at the provider's discretion.      For prescription refill requests, have your pharmacy contact our office and allow 72 hours for refills to be completed.    Today you received the following chemotherapy and/or immunotherapy agents velcade      To help prevent nausea and vomiting after your treatment, we encourage you to take your nausea medication as directed.  BELOW ARE SYMPTOMS THAT SHOULD BE REPORTED IMMEDIATELY: *FEVER GREATER THAN 100.4 F (38 C) OR HIGHER *CHILLS OR SWEATING *NAUSEA AND VOMITING THAT IS NOT CONTROLLED WITH YOUR NAUSEA MEDICATION *UNUSUAL SHORTNESS OF BREATH *UNUSUAL BRUISING OR BLEEDING *URINARY PROBLEMS (pain or burning when urinating, or frequent urination) *BOWEL PROBLEMS (unusual diarrhea, constipation, pain near the anus) TENDERNESS IN MOUTH AND THROAT WITH OR WITHOUT PRESENCE OF ULCERS (sore throat, sores in mouth, or a toothache) UNUSUAL RASH, SWELLING OR PAIN  UNUSUAL VAGINAL DISCHARGE OR ITCHING   Items with * indicate a potential emergency and should be followed up as soon as possible or go to the Emergency Department if any problems should occur.  Please show the CHEMOTHERAPY ALERT CARD or IMMUNOTHERAPY ALERT CARD at check-in to the  Emergency Department and triage nurse.  Should you have questions after your visit or need to cancel or reschedule your appointment, please contact [facility]  Dept: [phone]  and follow the prompts.  Office hours are 8:00 a.m. to 4:30 p.m. Monday - Friday. Please note that voicemails left after 4:00 p.m. may not be returned until the following business day.  We are closed weekends and major holidays. You have access to a nurse at all times for urgent questions. Please call the main number to the clinic Dept: [phone] and follow the prompts.   For any non-urgent questions, you may also contact your provider using MyChart. We now offer e-Visits for anyone 55 and older to request care online for non-urgent symptoms. For details visit [email or web address].   Also download the MyChart app! Go to the app store, search "MyChart", open the app, select [facility], and log in with your MyChart username and password.  Due to Covid, a mask is required upon entering the hospital/clinic. If you do not have a mask, one will be given to you upon arrival. For doctor visits, patients may have 1 support person aged 52 or older with them. For treatment visits, patients cannot have anyone with them due to current Covid guidelines and our immunocompromised population.

## 2020-10-25 NOTE — Telephone Encounter (Signed)
Sch per 9/28 los, delivered calendar in infusion 10/18

## 2020-10-25 NOTE — Progress Notes (Signed)
[facility] OFFICE PROGRESS NOTE  [name], MD [place] [id number]  DIAGNOSIS: Plasma cell dyscrasia initially diagnosed as MGUS in September 2010, with additional symptoms suggestive of POEMS syndrome.  PRIOR THERAPY: 1) Velcade 1.3 MG/M2 subcutaneously with Decadron 40 mg by mouth on a weekly basis. First cycle 11/24/2013. She status post 31 weekly doses of treatment. 2) Velcade 1.3 MG/M2 subcutaneously and weekly basis with Decadron 40 mg by mouth weekly. First dose 02/01/2015. Status post 28 cycles. 3) Revlimid 25 mg by mouth daily for 21 days every 4 weeks with weekly Decadron 20 mg. started in 11/27/2015. Status post 3 cycles discontinued secondary to lack of response.  CURRENT THERAPY: Systemic treatment with Velcade 1.3 MG/KG weekly, Revlimid 25 mg by mouth daily for 21 days every 4 weeks in addition to Decadron 20 mg by mouth weekly. First dose 03/06/2016. Status post 32 cycles.  She has a break off treatment from June 2021 until July 2022. Resumed July 19, 2020. Status post 13 additional weekly injections.   INTERVAL HISTORY: SIOBHAN ZARO 60 y.o. female returns to the clinic today for a follow-up visit.  The patient is feeling well today.  She was recently found to have disease progression in July 2022.  Therefore, she restarted her treatment with Revlimid, Velcade, and Decadron. She has been tolerating treatment well.  She denies any fever, chills, night sweats, or unexplained weight loss. She reports her baseline flushing/hot flashes as she is going through menopause. She denies any diarrhea or constipation.   She has stable peripheral neuropathy but denies changes from her baseline. She denies any abnormal bleeding or bruising.  She denies any signs and symptoms of infection. She sometimes has nausea without vomiting on the Thursday/Friday after her injection. She is requesting a refill of zofran and her decadron. She is here today for  evaluation and repeat blood work before starting her next cycle of velcade.   MEDICAL HISTORY: Past Medical History:  Diagnosis Date   Acid reflux    Anxiety    Asthma    Cancer ([place])    waldenstroms/ macroglobinulemia   Depression    Depression    Diabetes mellitus without complication ([place])    Dysuria 02/28/2016   Hypercholesteremia    Hypertension    Hypothyroidism    Macroglobulinemia ([place])    ? POEMS syndrome   Multiple myeloma ([facility])    Obesity    PONV (postoperative nausea and vomiting)    Sleep apnea    CPAP at bedtime   Sleep apnea     ALLERGIES:  is allergic to codeine, hydrocodone, lortab [hydrocodone-acetaminophen], onion, shellfish allergy, and amoxicillin.  MEDICATIONS:  Current Outpatient Medications  Medication Sig Dispense Refill   ACCU-CHEK AVIVA PLUS test strip USE TO CHECK SUGAR TWICE A DAY 90     acyclovir (ZOVIRAX) 400 MG tablet Take 1 tablet (400 mg total) by mouth 2 (two) times daily. 180 tablet 1   ALPRAZolam (XANAX) 0.5 MG tablet Take 1 tablet (0.5 mg total) by mouth at bedtime as needed for anxiety. 30 tablet 1   aspirin 81 MG chewable tablet Chew by mouth daily.     Azelastine HCl 0.15 % SOLN as needed.     B-D UF III MINI PEN NEEDLES 31G X 5 MM MISC      Blood Glucose Monitoring Suppl (ONE TOUCH ULTRA MINI) W/DEVICE KIT See admin instructions. Reported on 01/25/2015  0   bortezomib IV (VELCADE)  3.5 MG injection Inject 1.3 mg/m2 into the vein once.     Cetirizine HCl (ZYRTEC ALLERGY PO) Take by mouth daily.     dexamethasone (DECADRON) 4 MG tablet 5 tablets p.o. weekly on the day of chemotherapy. 60 tablet 2   DiphenhydrAMINE HCl (BENADRYL ALLERGY PO) Take by mouth as needed.     EPIPEN 2-PAK 0.3 MG/0.3ML SOAJ injection Reported on 06/21/2015  1   FeFum-FePoly-FA-B Cmp-C-Biot (INTEGRA PLUS) CAPS Take 1 capsule by mouth daily. 30 capsule 2   Fexofenadine HCl (ALLEGRA ALLERGY PO) Take by mouth daily.     Fluticasone-Salmeterol (ADVAIR) 100-50  MCG/DOSE AEPB Inhale 2 puffs into the lungs every 12 (twelve) hours.     ibuprofen (ADVIL,MOTRIN) 100 MG tablet Take 100 mg by mouth every 6 (six) hours as needed. Reported on 05/31/2015     Insulin Glargine (BASAGLAR KWIKPEN) 100 UNIT/ML Inject 20 Units into the skin at bedtime.     levothyroxine (SYNTHROID, LEVOTHROID) 150 MCG tablet Take 150 mcg by mouth daily before breakfast.     lisinopril (PRINIVIL,ZESTRIL) 10 MG tablet Take 10 mg by mouth daily. auth number 05/29/2018 [id number]  3   loperamide (IMODIUM) 2 MG capsule Take 2 mg by mouth as needed for diarrhea or loose stools.     magic mouthwash w/lidocaine SOLN Take 5 mLs by mouth 4 (four) times daily as needed for mouth pain. Swish, Gargle, and spit 240 mL 1   metFORMIN (GLUCOPHAGE-XR) 500 MG 24 hr tablet Take 500 mg by mouth daily.     NOVOLOG FLEXPEN 100 UNIT/ML FlexPen Inject 15 Units into the skin in the morning, at noon, and at bedtime. Per Sliding Scale     omeprazole (PRILOSEC) 40 MG capsule Take 40 mg by mouth 2 (two) times daily.      ondansetron (ZOFRAN-ODT) 8 MG disintegrating tablet Take 1 tablet (8 mg total) by mouth every 8 (eight) hours as needed. Reported on 05/31/2015 30 tablet 2   potassium chloride SA (KLOR-CON) 20 MEQ tablet Take 1 tablet (20 mEq total) by mouth daily. 6 tablet 0   pravastatin (PRAVACHOL) 40 MG tablet Take 40 mg by mouth every evening.      PROAIR HFA 108 (90 Base) MCG/ACT inhaler Reported on 06/21/2015  1   REVLIMID 25 MG capsule TAKE 1 CAPSULE BY MOUTH ONCE DAILY 21 DAYS ON AND 7 DAYS OFF 10/06/20-auth number [id number] 21 capsule 0   sertraline (ZOLOFT) 50 MG tablet Take 3 tablets (150 mg total) by mouth daily. 270 tablet 3   verapamil (CALAN-SR) 240 MG CR tablet Take 240 mg by mouth 2 (two) times daily.     No current facility-administered medications for this visit.    SURGICAL HISTORY:  Past Surgical History:  Procedure Laterality Date   ABLATION  09/09/2007   HTA and polyp resection   BACK  SURGERY  03/08/2004   herniation, L4-L5   Bil Laprascopic knee surgery     BONE MARROW BIOPSY     2011   BONE MARROW BIOPSY  04/08/2012   BONE MARROW BIOPSY  01/2020   CHOLECYSTECTOMY     FOOT SURGERY  01/09/1996   KNEE ARTHROSCOPY W/ MENISCAL REPAIR  10/10 ; 3/11   LAPAROSCOPIC CHOLECYSTECTOMY  01/09/1995   NASAL SINUS SURGERY  01/08/2002   UTERINE FIBROID EMBOLIZATION      REVIEW OF SYSTEMS:   Constitutional: Negative for appetite change, chills, fatigue, fever and unexpected weight change.  HENT: Negative for mouth sores, nosebleeds, sore throat and  trouble swallowing.   Eyes: Negative for eye problems and icterus.  Respiratory: Negative for cough, hemoptysis, shortness of breath and wheezing.   Cardiovascular: Negative for chest pain and leg swelling.  Gastrointestinal: Negative for abdominal pain, constipation, diarrhea, nausea and vomiting.  Genitourinary: Negative for bladder incontinence, difficulty urinating, dysuria, frequency and hematuria.   Musculoskeletal: Negative for back pain, gait problem, neck pain and neck stiffness.  Skin: Negative for itching and rash.  Neurological: Positive for stable peripheral neuropathy. Negative for dizziness, extremity weakness, gait problem, headaches, light-headedness and seizures.  Hematological: Negative for adenopathy. Does not bruise/bleed easily.  Psychiatric/Behavioral: Negative for confusion, depression and sleep disturbance. The patient is not nervous/anxious.     PHYSICAL EXAMINATION:  Blood pressure (!) 156/69, pulse 67, temperature 97.6 F (36.4 C), temperature source Oral, resp. rate 18, height 5' 6" (1.676 m), weight 262 lb 3.2 oz (118.9 kg), SpO2 99 %.  ECOG PERFORMANCE STATUS: 1  Physical Exam  Constitutional: Oriented to person, place, and time and well-developed, well-nourished, and in no distress.  HENT:  Head: Normocephalic and atraumatic.  Mouth/Throat: Oropharynx is clear and moist. No oropharyngeal exudate.   Eyes: Conjunctivae are normal. Right eye exhibits no discharge. Left eye exhibits no discharge. No scleral icterus.  Neck: Normal range of motion. Neck supple.  Cardiovascular: Normal rate, regular rhythm, normal heart sounds and intact distal pulses.   Pulmonary/Chest: Effort normal and breath sounds normal. No respiratory distress. No wheezes. No rales.  Abdominal: Soft. Bowel sounds are normal. Exhibits no distension and no mass. There is no tenderness.  Musculoskeletal: Normal range of motion. Exhibits no edema.  Lymphadenopathy:    No cervical adenopathy.  Neurological: Alert and oriented to person, place, and time. Exhibits normal muscle tone. Gait normal. Coordination normal.  Skin: Skin is warm and dry. No rash noted. Not diaphoretic. No erythema. No pallor.  Psychiatric: Mood, memory and judgment normal.  Vitals reviewed.  LABORATORY DATA: Lab Results  Component Value Date   WBC 3.8 (L) 11/01/2020   HGB 10.6 (L) 11/01/2020   HCT 32.1 (L) 11/01/2020   MCV 90.7 11/01/2020   PLT 102 (L) 11/01/2020      Chemistry      Component Value Date/Time   NA 138 11/01/2020 1354   NA 138 11/14/2016 0824   K 3.3 (L) 11/01/2020 1354   K 4.3 11/14/2016 0824   CL 103 11/01/2020 1354   CL 104 04/07/2012 1415   CO2 23 11/01/2020 1354   CO2 23 11/14/2016 0824   BUN 7 11/01/2020 1354   BUN 9.1 11/14/2016 0824   CREATININE 0.77 11/01/2020 1354   CREATININE 0.7 11/14/2016 0824      Component Value Date/Time   CALCIUM 9.2 11/01/2020 1354   CALCIUM 9.6 11/14/2016 0824   ALKPHOS 139 (H) 11/01/2020 1354   ALKPHOS 94 11/14/2016 0824   AST 43 (H) 11/01/2020 1354   AST 28 11/14/2016 0824   ALT 29 11/01/2020 1354   ALT 24 11/14/2016 0824   BILITOT 0.9 11/01/2020 1354   BILITOT 0.41 11/14/2016 0824       RADIOGRAPHIC STUDIES:  No results found.   ASSESSMENT/PLAN:  This is a very pleasant 60 year old Caucasian female with smoldering multiple myeloma with questionable POEMS  syndrome. Her myeloma panel showed continuous increase in her free lambda light chain.    The patient previously underwent 107 cycles of subcutaneous weekly Velcade, as well as Revlimid and Decadron on and off over the last several years.   She  had been off treatment since June 2021 until July 2022 in which she had evidence of disease progression.  Therefore, she was restarted on Velcade, Revlimid, and Decadron.  She has status post 13 cycles since restarting (cycle 107) she tolerated well without any concerning adverse side effects.   Labs reviewed. I recommend that she proceed with cycle #119 today as scheduled.   We will see her back for follow-up visit in 4 weeks for evaluation before starting cycle #123.   Her potassium was slightly low. Reviewed potassium rich food and encouraged her to increase her intake of potassium rich foods in her diet.   I have refilled her decadron and zofran.    The patient was advised to call immediately if she has any concerning symptoms in the interval. The patient voices understanding of current disease status and treatment options and is in agreement with the current care plan. All questions were answered. The patient knows to call the clinic with any problems, questions or concerns. We can certainly see the patient much sooner if necessary         No orders of the defined types were placed in this encounter.    The total time spent in the appointment was 20-29 minutes.   [place] Throckmorton L Safwan Tomei, PA-C 11/01/20

## 2020-11-01 ENCOUNTER — Inpatient Hospital Stay: Payer: 59

## 2020-11-01 ENCOUNTER — Other Ambulatory Visit: Payer: Self-pay

## 2020-11-01 ENCOUNTER — Inpatient Hospital Stay (HOSPITAL_BASED_OUTPATIENT_CLINIC_OR_DEPARTMENT_OTHER): Payer: 59 | Admitting: Physician Assistant

## 2020-11-01 ENCOUNTER — Ambulatory Visit: Payer: 59

## 2020-11-01 ENCOUNTER — Other Ambulatory Visit: Payer: 59

## 2020-11-01 VITALS — BP 156/69 | HR 67 | Temp 97.6°F | Resp 18 | Ht 66.0 in | Wt 262.2 lb

## 2020-11-01 DIAGNOSIS — R110 Nausea: Secondary | ICD-10-CM

## 2020-11-01 DIAGNOSIS — Z5112 Encounter for antineoplastic immunotherapy: Secondary | ICD-10-CM | POA: Diagnosis not present

## 2020-11-01 DIAGNOSIS — C9000 Multiple myeloma not having achieved remission: Secondary | ICD-10-CM | POA: Diagnosis not present

## 2020-11-01 LAB — CMP (CANCER CENTER ONLY)
ALT: 29 U/L (ref 0–44)
AST: 43 U/L — ABNORMAL HIGH (ref 15–41)
Albumin: 3.7 g/dL (ref 3.5–5.0)
Alkaline Phosphatase: 139 U/L — ABNORMAL HIGH (ref 38–126)
Anion gap: 12 (ref 5–15)
BUN: 7 mg/dL (ref 6–20)
CO2: 23 mmol/L (ref 22–32)
Calcium: 9.2 mg/dL (ref 8.9–10.3)
Chloride: 103 mmol/L (ref 98–111)
Creatinine: 0.77 mg/dL (ref 0.44–1.00)
GFR, Estimated: >60 mL/min (ref >60)
Glucose, Bld: 149 mg/dL — ABNORMAL HIGH (ref 70–99)
Potassium: 3.3 mmol/L — ABNORMAL LOW (ref 3.5–5.1)
Sodium: 138 mmol/L (ref 135–145)
Total Bilirubin: 0.9 mg/dL (ref 0.3–1.2)
Total Protein: 7 g/dL (ref 6.5–8.1)

## 2020-11-01 LAB — CBC WITH DIFFERENTIAL (CANCER CENTER ONLY)
Abs Immature Granulocytes: 0.01 10*3/uL (ref 0.00–0.07)
Basophils Absolute: 0 10*3/uL (ref 0.0–0.1)
Basophils Relative: 1 %
Eosinophils Absolute: 0.2 10*3/uL (ref 0.0–0.5)
Eosinophils Relative: 6 %
HCT: 32.1 % — ABNORMAL LOW (ref 36.0–46.0)
Hemoglobin: 10.6 g/dL — ABNORMAL LOW (ref 12.0–15.0)
Immature Granulocytes: 0 %
Lymphocytes Relative: 16 %
Lymphs Abs: 0.6 10*3/uL — ABNORMAL LOW (ref 0.7–4.0)
MCH: 29.9 pg (ref 26.0–34.0)
MCHC: 33 g/dL (ref 30.0–36.0)
MCV: 90.7 fL (ref 80.0–100.0)
Monocytes Absolute: 0.4 10*3/uL (ref 0.1–1.0)
Monocytes Relative: 11 %
Neutro Abs: 2.5 10*3/uL (ref 1.7–7.7)
Neutrophils Relative %: 66 %
Platelet Count: 102 10*3/uL — ABNORMAL LOW (ref 150–400)
RBC: 3.54 MIL/uL — ABNORMAL LOW (ref 3.87–5.11)
RDW: 15 % (ref 11.5–15.5)
WBC Count: 3.8 10*3/uL — ABNORMAL LOW (ref 4.0–10.5)
nRBC: 0 % (ref 0.0–0.2)

## 2020-11-01 MED ORDER — ONDANSETRON 8 MG PO TBDP: 8.0000 mg | ORAL_TABLET | Freq: Three times a day (TID) | ORAL | 2 refills | Status: DC | PRN

## 2020-11-01 MED ORDER — BORTEZOMIB CHEMO SQ INJECTION 3.5 MG (2.5MG/ML)
1.3000 mg/m2 | Freq: Once | INTRAMUSCULAR | Status: AC
  Administered 2020-11-01: 3 mg via SUBCUTANEOUS
  Filled 2020-11-01: qty 1.2

## 2020-11-01 MED ORDER — DEXAMETHASONE 4 MG PO TABS: ORAL_TABLET | ORAL | 2 refills | Status: DC

## 2020-11-01 MED ORDER — ONDANSETRON HCL 8 MG PO TABS
8.0000 mg | ORAL_TABLET | Freq: Once | ORAL | Status: AC
  Administered 2020-11-01: 8 mg via ORAL
  Filled 2020-11-01: qty 1

## 2020-11-01 NOTE — Patient Instructions (Signed)
[facility]  Discharge Instructions: Thank you for choosing [facility] to provide your oncology and hematology care.   If you have a lab appointment with the [facility], please go directly to the [facility] and check in at the registration area.   Wear comfortable clothing and clothing appropriate for easy access to any Portacath or PICC line.   We strive to give you quality time with your provider. You may need to reschedule your appointment if you arrive late (15 or more minutes).  Arriving late affects you and other patients whose appointments are after yours.  Also, if you miss three or more appointments without notifying the office, you may be dismissed from the clinic at the provider's discretion.      For prescription refill requests, have your pharmacy contact our office and allow 72 hours for refills to be completed.    Today you received the following chemotherapy and/or immunotherapy agents velcade       To help prevent nausea and vomiting after your treatment, we encourage you to take your nausea medication as directed.  BELOW ARE SYMPTOMS THAT SHOULD BE REPORTED IMMEDIATELY: *FEVER GREATER THAN 100.4 F (38 C) OR HIGHER *CHILLS OR SWEATING *NAUSEA AND VOMITING THAT IS NOT CONTROLLED WITH YOUR NAUSEA MEDICATION *UNUSUAL SHORTNESS OF BREATH *UNUSUAL BRUISING OR BLEEDING *URINARY PROBLEMS (pain or burning when urinating, or frequent urination) *BOWEL PROBLEMS (unusual diarrhea, constipation, pain near the anus) TENDERNESS IN MOUTH AND THROAT WITH OR WITHOUT PRESENCE OF ULCERS (sore throat, sores in mouth, or a toothache) UNUSUAL RASH, SWELLING OR PAIN  UNUSUAL VAGINAL DISCHARGE OR ITCHING   Items with * indicate a potential emergency and should be followed up as soon as possible or go to the Emergency Department if any problems should occur.  Please show the CHEMOTHERAPY ALERT CARD or IMMUNOTHERAPY ALERT CARD at check-in to  the Emergency Department and triage nurse.  Should you have questions after your visit or need to cancel or reschedule your appointment, please contact [name]  Dept: [phone]  and follow the prompts.  Office hours are 8:00 a.m. to 4:30 p.m. Monday - Friday. Please note that voicemails left after 4:00 p.m. may not be returned until the following business day.  We are closed weekends and major holidays. You have access to a nurse at all times for urgent questions. Please call the main number to the clinic Dept: [phone] and follow the prompts.   For any non-urgent questions, you may also contact your provider using MyChart. We now offer e-Visits for anyone 74 and older to request care online for non-urgent symptoms. For details visit [email or web address].   Also download the MyChart app! Go to the app store, search "MyChart", open the app, select [facility], and log in with your MyChart username and password.  Due to Covid, a mask is required upon entering the hospital/clinic. If you do not have a mask, one will be given to you upon arrival. For doctor visits, patients may have 1 support person aged 67 or older with them. For treatment visits, patients cannot have anyone with them due to current Covid guidelines and our immunocompromised population.

## 2020-11-04 ENCOUNTER — Other Ambulatory Visit: Payer: Self-pay | Admitting: Physician Assistant

## 2020-11-04 ENCOUNTER — Other Ambulatory Visit: Payer: Self-pay | Admitting: Internal Medicine

## 2020-11-04 DIAGNOSIS — C9000 Multiple myeloma not having achieved remission: Secondary | ICD-10-CM

## 2020-11-04 MED ORDER — REVLIMID 25 MG PO CAPS: ORAL_CAPSULE | ORAL | 0 refills | Status: DC

## 2020-11-08 ENCOUNTER — Inpatient Hospital Stay: Payer: 59 | Attending: Physician Assistant

## 2020-11-08 ENCOUNTER — Inpatient Hospital Stay: Payer: 59

## 2020-11-08 ENCOUNTER — Other Ambulatory Visit: Payer: Self-pay

## 2020-11-08 ENCOUNTER — Other Ambulatory Visit: Payer: Self-pay | Admitting: Internal Medicine

## 2020-11-08 VITALS — BP 107/49 | HR 63 | Temp 98.2°F | Resp 16

## 2020-11-08 DIAGNOSIS — C9000 Multiple myeloma not having achieved remission: Secondary | ICD-10-CM

## 2020-11-08 DIAGNOSIS — C9030 Solitary plasmacytoma not having achieved remission: Secondary | ICD-10-CM | POA: Diagnosis present

## 2020-11-08 DIAGNOSIS — Z7952 Long term (current) use of systemic steroids: Secondary | ICD-10-CM | POA: Insufficient documentation

## 2020-11-08 DIAGNOSIS — Z79899 Other long term (current) drug therapy: Secondary | ICD-10-CM | POA: Diagnosis not present

## 2020-11-08 DIAGNOSIS — R5383 Other fatigue: Secondary | ICD-10-CM | POA: Insufficient documentation

## 2020-11-08 DIAGNOSIS — Z5112 Encounter for antineoplastic immunotherapy: Secondary | ICD-10-CM | POA: Insufficient documentation

## 2020-11-08 DIAGNOSIS — G629 Polyneuropathy, unspecified: Secondary | ICD-10-CM | POA: Diagnosis not present

## 2020-11-08 LAB — CBC WITH DIFFERENTIAL (CANCER CENTER ONLY)
Abs Immature Granulocytes: 0.01 10*3/uL (ref 0.00–0.07)
Basophils Absolute: 0 10*3/uL (ref 0.0–0.1)
Basophils Relative: 1 %
Eosinophils Absolute: 0.2 10*3/uL (ref 0.0–0.5)
Eosinophils Relative: 6 %
HCT: 32.8 % — ABNORMAL LOW (ref 36.0–46.0)
Hemoglobin: 10.7 g/dL — ABNORMAL LOW (ref 12.0–15.0)
Immature Granulocytes: 0 %
Lymphocytes Relative: 17 %
Lymphs Abs: 0.5 10*3/uL — ABNORMAL LOW (ref 0.7–4.0)
MCH: 29.6 pg (ref 26.0–34.0)
MCHC: 32.6 g/dL (ref 30.0–36.0)
MCV: 90.6 fL (ref 80.0–100.0)
Monocytes Absolute: 0.3 10*3/uL (ref 0.1–1.0)
Monocytes Relative: 11 %
Neutro Abs: 1.8 10*3/uL (ref 1.7–7.7)
Neutrophils Relative %: 65 %
Platelet Count: 86 10*3/uL — ABNORMAL LOW (ref 150–400)
RBC: 3.62 MIL/uL — ABNORMAL LOW (ref 3.87–5.11)
RDW: 15 % (ref 11.5–15.5)
WBC Count: 2.7 10*3/uL — ABNORMAL LOW (ref 4.0–10.5)
nRBC: 0 % (ref 0.0–0.2)

## 2020-11-08 LAB — CMP (CANCER CENTER ONLY)
ALT: 33 U/L (ref 0–44)
AST: 44 U/L — ABNORMAL HIGH (ref 15–41)
Albumin: 3.6 g/dL (ref 3.5–5.0)
Alkaline Phosphatase: 165 U/L — ABNORMAL HIGH (ref 38–126)
Anion gap: 9 (ref 5–15)
BUN: 9 mg/dL (ref 6–20)
CO2: 24 mmol/L (ref 22–32)
Calcium: 8.7 mg/dL — ABNORMAL LOW (ref 8.9–10.3)
Chloride: 103 mmol/L (ref 98–111)
Creatinine: 0.84 mg/dL (ref 0.44–1.00)
GFR, Estimated: >60 mL/min (ref >60)
Glucose, Bld: 222 mg/dL — ABNORMAL HIGH (ref 70–99)
Potassium: 3.5 mmol/L (ref 3.5–5.1)
Sodium: 136 mmol/L (ref 135–145)
Total Bilirubin: 0.8 mg/dL (ref 0.3–1.2)
Total Protein: 6.9 g/dL (ref 6.5–8.1)

## 2020-11-08 MED ORDER — BORTEZOMIB CHEMO SQ INJECTION 3.5 MG (2.5MG/ML)
1.3000 mg/m2 | Freq: Once | INTRAMUSCULAR | Status: AC
  Administered 2020-11-08: 3 mg via SUBCUTANEOUS
  Filled 2020-11-08: qty 1.2

## 2020-11-08 MED ORDER — ONDANSETRON HCL 8 MG PO TABS
8.0000 mg | ORAL_TABLET | Freq: Once | ORAL | Status: AC
  Administered 2020-11-08: 8 mg via ORAL
  Filled 2020-11-08: qty 1

## 2020-11-08 NOTE — Patient Instructions (Signed)
[facility]  Discharge Instructions: Thank you for choosing [facility] to provide your oncology and hematology care.   If you have a lab appointment with the [facility], please go directly to the [facility] and check in at the registration area.   Wear comfortable clothing and clothing appropriate for easy access to any Portacath or PICC line.   We strive to give you quality time with your provider. You may need to reschedule your appointment if you arrive late (15 or more minutes).  Arriving late affects you and other patients whose appointments are after yours.  Also, if you miss three or more appointments without notifying the office, you may be dismissed from the clinic at the provider's discretion.      For prescription refill requests, have your pharmacy contact our office and allow 72 hours for refills to be completed.    Today you received the following chemotherapy and/or immunotherapy agents velcade       To help prevent nausea and vomiting after your treatment, we encourage you to take your nausea medication as directed.  BELOW ARE SYMPTOMS THAT SHOULD BE REPORTED IMMEDIATELY: *FEVER GREATER THAN 100.4 F (38 C) OR HIGHER *CHILLS OR SWEATING *NAUSEA AND VOMITING THAT IS NOT CONTROLLED WITH YOUR NAUSEA MEDICATION *UNUSUAL SHORTNESS OF BREATH *UNUSUAL BRUISING OR BLEEDING *URINARY PROBLEMS (pain or burning when urinating, or frequent urination) *BOWEL PROBLEMS (unusual diarrhea, constipation, pain near the anus) TENDERNESS IN MOUTH AND THROAT WITH OR WITHOUT PRESENCE OF ULCERS (sore throat, sores in mouth, or a toothache) UNUSUAL RASH, SWELLING OR PAIN  UNUSUAL VAGINAL DISCHARGE OR ITCHING   Items with * indicate a potential emergency and should be followed up as soon as possible or go to the Emergency Department if any problems should occur.  Please show the CHEMOTHERAPY ALERT CARD or IMMUNOTHERAPY ALERT CARD at check-in to  the Emergency Department and triage nurse.  Should you have questions after your visit or need to cancel or reschedule your appointment, please contact [name]  Dept: [phone]  and follow the prompts.  Office hours are 8:00 a.m. to 4:30 p.m. Monday - Friday. Please note that voicemails left after 4:00 p.m. may not be returned until the following business day.  We are closed weekends and major holidays. You have access to a nurse at all times for urgent questions. Please call the main number to the clinic Dept: [phone] and follow the prompts.   For any non-urgent questions, you may also contact your provider using MyChart. We now offer e-Visits for anyone 74 and older to request care online for non-urgent symptoms. For details visit [email or web address].   Also download the MyChart app! Go to the app store, search "MyChart", open the app, select [facility], and log in with your MyChart username and password.  Due to Covid, a mask is required upon entering the hospital/clinic. If you do not have a mask, one will be given to you upon arrival. For doctor visits, patients may have 1 support person aged 67 or older with them. For treatment visits, patients cannot have anyone with them due to current Covid guidelines and our immunocompromised population.

## 2020-11-08 NOTE — Progress Notes (Signed)
Okay to treat with plts 73 per [name] Heilingoetter, PA

## 2020-11-15 ENCOUNTER — Other Ambulatory Visit: Payer: Self-pay

## 2020-11-15 ENCOUNTER — Inpatient Hospital Stay: Payer: 59

## 2020-11-15 VITALS — BP 127/61 | HR 63 | Temp 98.3°F | Resp 18 | Wt 262.0 lb

## 2020-11-15 DIAGNOSIS — C9000 Multiple myeloma not having achieved remission: Secondary | ICD-10-CM

## 2020-11-15 DIAGNOSIS — Z5112 Encounter for antineoplastic immunotherapy: Secondary | ICD-10-CM | POA: Diagnosis not present

## 2020-11-15 LAB — CMP (CANCER CENTER ONLY)
ALT: 25 U/L (ref 0–44)
AST: 36 U/L (ref 15–41)
Albumin: 3.6 g/dL (ref 3.5–5.0)
Alkaline Phosphatase: 151 U/L — ABNORMAL HIGH (ref 38–126)
Anion gap: 9 (ref 5–15)
BUN: 9 mg/dL (ref 6–20)
CO2: 21 mmol/L — ABNORMAL LOW (ref 22–32)
Calcium: 8.6 mg/dL — ABNORMAL LOW (ref 8.9–10.3)
Chloride: 107 mmol/L (ref 98–111)
Creatinine: 0.78 mg/dL (ref 0.44–1.00)
GFR, Estimated: >60 mL/min (ref >60)
Glucose, Bld: 201 mg/dL — ABNORMAL HIGH (ref 70–99)
Potassium: 3.7 mmol/L (ref 3.5–5.1)
Sodium: 137 mmol/L (ref 135–145)
Total Bilirubin: 0.6 mg/dL (ref 0.3–1.2)
Total Protein: 6.7 g/dL (ref 6.5–8.1)

## 2020-11-15 LAB — CBC WITH DIFFERENTIAL (CANCER CENTER ONLY)
Abs Immature Granulocytes: 0.01 10*3/uL (ref 0.00–0.07)
Basophils Absolute: 0.1 10*3/uL (ref 0.0–0.1)
Basophils Relative: 3 %
Eosinophils Absolute: 0.1 10*3/uL (ref 0.0–0.5)
Eosinophils Relative: 2 %
HCT: 32.2 % — ABNORMAL LOW (ref 36.0–46.0)
Hemoglobin: 10.5 g/dL — ABNORMAL LOW (ref 12.0–15.0)
Immature Granulocytes: 0 %
Lymphocytes Relative: 24 %
Lymphs Abs: 0.9 10*3/uL (ref 0.7–4.0)
MCH: 29.7 pg (ref 26.0–34.0)
MCHC: 32.6 g/dL (ref 30.0–36.0)
MCV: 91.2 fL (ref 80.0–100.0)
Monocytes Absolute: 0.4 10*3/uL (ref 0.1–1.0)
Monocytes Relative: 11 %
Neutro Abs: 2.2 10*3/uL (ref 1.7–7.7)
Neutrophils Relative %: 60 %
Platelet Count: 112 10*3/uL — ABNORMAL LOW (ref 150–400)
RBC: 3.53 MIL/uL — ABNORMAL LOW (ref 3.87–5.11)
RDW: 15.1 % (ref 11.5–15.5)
WBC Count: 3.6 10*3/uL — ABNORMAL LOW (ref 4.0–10.5)
nRBC: 0 % (ref 0.0–0.2)

## 2020-11-15 MED ORDER — ONDANSETRON HCL 8 MG PO TABS
8.0000 mg | ORAL_TABLET | Freq: Once | ORAL | Status: AC
  Administered 2020-11-15: 8 mg via ORAL
  Filled 2020-11-15: qty 1

## 2020-11-15 MED ORDER — BORTEZOMIB CHEMO SQ INJECTION 3.5 MG (2.5MG/ML)
1.3000 mg/m2 | Freq: Once | INTRAMUSCULAR | Status: AC
  Administered 2020-11-15: 3 mg via SUBCUTANEOUS
  Filled 2020-11-15: qty 1.2

## 2020-11-15 NOTE — Patient Instructions (Signed)
[facility]  Discharge Instructions: Thank you for choosing [facility] to provide your oncology and hematology care.   If you have a lab appointment with the [facility], please go directly to the [facility] and check in at the registration area.   Wear comfortable clothing and clothing appropriate for easy access to any Portacath or PICC line.   We strive to give you quality time with your provider. You may need to reschedule your appointment if you arrive late (15 or more minutes).  Arriving late affects you and other patients whose appointments are after yours.  Also, if you miss three or more appointments without notifying the office, you may be dismissed from the clinic at the provider's discretion.      For prescription refill requests, have your pharmacy contact our office and allow 72 hours for refills to be completed.    Today you received the following chemotherapy and/or immunotherapy agents velcade       To help prevent nausea and vomiting after your treatment, we encourage you to take your nausea medication as directed.  BELOW ARE SYMPTOMS THAT SHOULD BE REPORTED IMMEDIATELY: *FEVER GREATER THAN 100.4 F (38 C) OR HIGHER *CHILLS OR SWEATING *NAUSEA AND VOMITING THAT IS NOT CONTROLLED WITH YOUR NAUSEA MEDICATION *UNUSUAL SHORTNESS OF BREATH *UNUSUAL BRUISING OR BLEEDING *URINARY PROBLEMS (pain or burning when urinating, or frequent urination) *BOWEL PROBLEMS (unusual diarrhea, constipation, pain near the anus) TENDERNESS IN MOUTH AND THROAT WITH OR WITHOUT PRESENCE OF ULCERS (sore throat, sores in mouth, or a toothache) UNUSUAL RASH, SWELLING OR PAIN  UNUSUAL VAGINAL DISCHARGE OR ITCHING   Items with * indicate a potential emergency and should be followed up as soon as possible or go to the Emergency Department if any problems should occur.  Please show the CHEMOTHERAPY ALERT CARD or IMMUNOTHERAPY ALERT CARD at check-in to  the Emergency Department and triage nurse.  Should you have questions after your visit or need to cancel or reschedule your appointment, please contact [name]  Dept: [phone]  and follow the prompts.  Office hours are 8:00 a.m. to 4:30 p.m. Monday - Friday. Please note that voicemails left after 4:00 p.m. may not be returned until the following business day.  We are closed weekends and major holidays. You have access to a nurse at all times for urgent questions. Please call the main number to the clinic Dept: [phone] and follow the prompts.   For any non-urgent questions, you may also contact your provider using MyChart. We now offer e-Visits for anyone 74 and older to request care online for non-urgent symptoms. For details visit [email or web address].   Also download the MyChart app! Go to the app store, search "MyChart", open the app, select [facility], and log in with your MyChart username and password.  Due to Covid, a mask is required upon entering the hospital/clinic. If you do not have a mask, one will be given to you upon arrival. For doctor visits, patients may have 1 support person aged 67 or older with them. For treatment visits, patients cannot have anyone with them due to current Covid guidelines and our immunocompromised population.

## 2020-11-22 ENCOUNTER — Other Ambulatory Visit: Payer: Self-pay

## 2020-11-22 ENCOUNTER — Inpatient Hospital Stay: Payer: 59

## 2020-11-22 VITALS — BP 137/49 | HR 58 | Temp 97.9°F | Resp 18 | Wt 258.0 lb

## 2020-11-22 DIAGNOSIS — Z5112 Encounter for antineoplastic immunotherapy: Secondary | ICD-10-CM | POA: Diagnosis not present

## 2020-11-22 DIAGNOSIS — C9000 Multiple myeloma not having achieved remission: Secondary | ICD-10-CM

## 2020-11-22 LAB — CBC WITH DIFFERENTIAL (CANCER CENTER ONLY)
Abs Immature Granulocytes: 0.01 10*3/uL (ref 0.00–0.07)
Basophils Absolute: 0.1 10*3/uL (ref 0.0–0.1)
Basophils Relative: 2 %
Eosinophils Absolute: 0.1 10*3/uL (ref 0.0–0.5)
Eosinophils Relative: 3 %
HCT: 33.7 % — ABNORMAL LOW (ref 36.0–46.0)
Hemoglobin: 10.9 g/dL — ABNORMAL LOW (ref 12.0–15.0)
Immature Granulocytes: 0 %
Lymphocytes Relative: 22 %
Lymphs Abs: 0.7 10*3/uL (ref 0.7–4.0)
MCH: 29.5 pg (ref 26.0–34.0)
MCHC: 32.3 g/dL (ref 30.0–36.0)
MCV: 91.3 fL (ref 80.0–100.0)
Monocytes Absolute: 0.2 10*3/uL (ref 0.1–1.0)
Monocytes Relative: 6 %
Neutro Abs: 2.2 10*3/uL (ref 1.7–7.7)
Neutrophils Relative %: 67 %
Platelet Count: 107 10*3/uL — ABNORMAL LOW (ref 150–400)
RBC: 3.69 MIL/uL — ABNORMAL LOW (ref 3.87–5.11)
RDW: 15 % (ref 11.5–15.5)
WBC Count: 3.3 10*3/uL — ABNORMAL LOW (ref 4.0–10.5)
nRBC: 0 % (ref 0.0–0.2)

## 2020-11-22 LAB — CMP (CANCER CENTER ONLY)
ALT: 26 U/L (ref 0–44)
AST: 32 U/L (ref 15–41)
Albumin: 3.6 g/dL (ref 3.5–5.0)
Alkaline Phosphatase: 138 U/L — ABNORMAL HIGH (ref 38–126)
Anion gap: 9 (ref 5–15)
BUN: 9 mg/dL (ref 6–20)
CO2: 23 mmol/L (ref 22–32)
Calcium: 8.6 mg/dL — ABNORMAL LOW (ref 8.9–10.3)
Chloride: 105 mmol/L (ref 98–111)
Creatinine: 0.83 mg/dL (ref 0.44–1.00)
GFR, Estimated: >60 mL/min (ref >60)
Glucose, Bld: 191 mg/dL — ABNORMAL HIGH (ref 70–99)
Potassium: 3.6 mmol/L (ref 3.5–5.1)
Sodium: 137 mmol/L (ref 135–145)
Total Bilirubin: 0.6 mg/dL (ref 0.3–1.2)
Total Protein: 6.6 g/dL (ref 6.5–8.1)

## 2020-11-22 MED ORDER — ONDANSETRON HCL 8 MG PO TABS
8.0000 mg | ORAL_TABLET | Freq: Once | ORAL | Status: AC
  Administered 2020-11-22: 8 mg via ORAL
  Filled 2020-11-22: qty 1

## 2020-11-22 MED ORDER — BORTEZOMIB CHEMO SQ INJECTION 3.5 MG (2.5MG/ML)
1.3000 mg/m2 | Freq: Once | INTRAMUSCULAR | Status: AC
  Administered 2020-11-22: 3 mg via SUBCUTANEOUS
  Filled 2020-11-22: qty 1.2

## 2020-11-22 NOTE — Progress Notes (Signed)
[facility] OFFICE PROGRESS NOTE  [name], MD [place] [address]  DIAGNOSIS: Plasma cell dyscrasia initially diagnosed as MGUS in September 2010, with additional symptoms suggestive of POEMS syndrome.  PRIOR THERAPY: 1) Velcade 1.3 MG/M2 subcutaneously with Decadron 40 mg by mouth on a weekly basis. First cycle 11/24/2013. She status post 31 weekly doses of treatment. 2) Velcade 1.3 MG/M2 subcutaneously and weekly basis with Decadron 40 mg by mouth weekly. First dose 02/01/2015. Status post 28 cycles. 3) Revlimid 25 mg by mouth daily for 21 days every 4 weeks with weekly Decadron 20 mg. started in 11/27/2015. Status post 3 cycles discontinued secondary to lack of response.    CURRENT THERAPY: Systemic treatment with Velcade 1.3 MG/KG weekly, Revlimid 25 mg by mouth daily for 21 days every 4 weeks in addition to Decadron 20 mg by mouth weekly. First dose 03/06/2016. Status post 32 cycles.  She has a break off treatment from June 2021 until July 2022. Resumed July 19, 2020. Status post 16 additional weekly injections.   INTERVAL HISTORY: Brenda Conner 60 y.o. female returns to the clinic today for a follow-up visit.  The patient is feeling well today.  She was found to have disease progression in July 2022 after being on observation for about 1 year.  Therefore, she restarted her treatment with Revlimid, Velcade, and Decadron. She has been tolerating treatment well. About 2 weeks ago after her treatment, she had 2-3 days of feeling sluggish and cold, which is unusual for her as she frequently comes to the clinic feeling hot. She did not have any specific infection symptoms such as sore throat, nasal congestion, cough, shortness of breath, skin infections, abdominal pain, or dysuria. She has chronic intermittent constipation and diarrhea which is unchanged. Her symptoms resolved after a few days and she has been feeling well since that time. Today, she  denies any fever, chills, night sweats, or unexplained weight loss. She states she had diarrhea moreso than constipation the last few days but has not taken imodium. She has stable peripheral neuropathy but denies changes from her baseline. She denies any abnormal bleeding or bruising. She sometimes has nausea without vomiting on the Thursday/Friday after her injection. She is planning on going out of state the last two weeks in December to visit family and is wondering if she can skip treatment those weeks. She is here today for evaluation and repeat blood work before starting her next cycle of velcade.      MEDICAL HISTORY: Past Medical History:  Diagnosis Date   Acid reflux    Anxiety    Asthma    Cancer ([place])    waldenstroms/ macroglobinulemia   Depression    Depression    Diabetes mellitus without complication ([place])    Dysuria 02/28/2016   Hypercholesteremia    Hypertension    Hypothyroidism    Macroglobulinemia ([place])    ? POEMS syndrome   Multiple myeloma ([facility])    Obesity    PONV (postoperative nausea and vomiting)    Sleep apnea    CPAP at bedtime   Sleep apnea     ALLERGIES:  is allergic to codeine, hydrocodone, lortab [hydrocodone-acetaminophen], onion, shellfish allergy, and amoxicillin.  MEDICATIONS:  Current Outpatient Medications  Medication Sig Dispense Refill   ACCU-CHEK AVIVA PLUS test strip USE TO CHECK SUGAR TWICE A DAY 90     acyclovir (ZOVIRAX) 400 MG tablet Take 1 tablet (400 mg total) by  mouth 2 (two) times daily. 180 tablet 1   ALPRAZolam (XANAX) 0.5 MG tablet Take 1 tablet (0.5 mg total) by mouth at bedtime as needed for anxiety. 30 tablet 1   aspirin 81 MG chewable tablet Chew by mouth daily.     Azelastine HCl 0.15 % SOLN as needed.     B-D UF III MINI PEN NEEDLES 31G X 5 MM MISC      Blood Glucose Monitoring Suppl (ONE TOUCH ULTRA MINI) W/DEVICE KIT See admin instructions. Reported on 01/25/2015  0   bortezomib IV (VELCADE) 3.5 MG injection Inject 1.3  mg/m2 into the vein once.     Cetirizine HCl (ZYRTEC ALLERGY PO) Take by mouth daily.     dexamethasone (DECADRON) 4 MG tablet 5 tablets p.o. weekly on the day of chemotherapy. 60 tablet 2   DiphenhydrAMINE HCl (BENADRYL ALLERGY PO) Take by mouth as needed.     EPIPEN 2-PAK 0.3 MG/0.3ML SOAJ injection Reported on 06/21/2015  1   Fexofenadine HCl (ALLEGRA ALLERGY PO) Take by mouth daily.     Fluticasone-Salmeterol (ADVAIR) 100-50 MCG/DOSE AEPB Inhale 2 puffs into the lungs every 12 (twelve) hours.     ibuprofen (ADVIL,MOTRIN) 100 MG tablet Take 100 mg by mouth every 6 (six) hours as needed. Reported on 05/31/2015     Insulin Glargine (BASAGLAR KWIKPEN) 100 UNIT/ML Inject 20 Units into the skin at bedtime.     levothyroxine (SYNTHROID, LEVOTHROID) 150 MCG tablet Take 150 mcg by mouth daily before breakfast.     lisinopril (PRINIVIL,ZESTRIL) 10 MG tablet Take 10 mg by mouth daily. auth number 05/29/2018 [id number]  3   loperamide (IMODIUM) 2 MG capsule Take 2 mg by mouth as needed for diarrhea or loose stools.     magic mouthwash w/lidocaine SOLN Take 5 mLs by mouth 4 (four) times daily as needed for mouth pain. Swish, Gargle, and spit 240 mL 1   metFORMIN (GLUCOPHAGE-XR) 500 MG 24 hr tablet Take 500 mg by mouth daily.     NOVOLOG FLEXPEN 100 UNIT/ML FlexPen Inject 15 Units into the skin in the morning, at noon, and at bedtime. Per Sliding Scale     omeprazole (PRILOSEC) 40 MG capsule Take 40 mg by mouth 2 (two) times daily.      ondansetron (ZOFRAN-ODT) 8 MG disintegrating tablet Take 1 tablet (8 mg total) by mouth every 8 (eight) hours as needed. Reported on 05/31/2015 30 tablet 2   pravastatin (PRAVACHOL) 40 MG tablet Take 40 mg by mouth every evening.      PROAIR HFA 108 (90 Base) MCG/ACT inhaler Reported on 06/21/2015  1   REVLIMID 25 MG capsule TAKE 1 CAPSULE BY MOUTH ONCE DAILY 21 DAYS ON AND 7 DAYS OFF 11/04/20-auth number [id number]. Adult female not of child bearing age 65 capsule 0   sertraline  (ZOLOFT) 50 MG tablet Take 3 tablets (150 mg total) by mouth daily. 270 tablet 3   verapamil (CALAN-SR) 240 MG CR tablet Take 240 mg by mouth 2 (two) times daily.     No current facility-administered medications for this visit.   Facility-Administered Medications Ordered in Other Visits  Medication Dose Route Frequency Provider Last Rate Last Admin   bortezomib SQ (VELCADE) chemo injection (2.55m/mL concentration) 3 mg  1.3 mg/m2 (Treatment Plan Recorded) Subcutaneous Once MCurt Bears MD        SURGICAL HISTORY:  Past Surgical History:  Procedure Laterality Date   ABLATION  09/09/2007   HTA and polyp resection   BACK  SURGERY  03/08/2004   herniation, L4-L5   Bil Laprascopic knee surgery     BONE MARROW BIOPSY     2011   BONE MARROW BIOPSY  04/08/2012   BONE MARROW BIOPSY  01/2020   CHOLECYSTECTOMY     FOOT SURGERY  01/09/1996   KNEE ARTHROSCOPY W/ MENISCAL REPAIR  10/10 ; 3/11   LAPAROSCOPIC CHOLECYSTECTOMY  01/09/1995   NASAL SINUS SURGERY  01/08/2002   UTERINE FIBROID EMBOLIZATION      REVIEW OF SYSTEMS:   Constitutional: Negative for appetite change, chills, fatigue, fever and unexpected weight change.  HENT: Negative for mouth sores, nosebleeds, sore throat and trouble swallowing.   Eyes: Negative for eye problems and icterus.  Respiratory: Negative for cough, hemoptysis, shortness of breath and wheezing.   Cardiovascular: Negative for chest pain and leg swelling.  Gastrointestinal: Negative for abdominal pain, constipation, diarrhea, nausea and vomiting.  Genitourinary: Negative for bladder incontinence, difficulty urinating, dysuria, frequency and hematuria.   Musculoskeletal: Negative for back pain, gait problem, neck pain and neck stiffness.  Skin: Negative for itching and rash.  Neurological: Positive for stable peripheral neuropathy. Negative for dizziness, extremity weakness, gait problem, headaches, light-headedness and seizures.  Hematological: Negative for  adenopathy. Does not bruise/bleed easily.  Psychiatric/Behavioral: Negative for confusion, depression and sleep disturbance. The patient is not nervous/anxious.    PHYSICAL EXAMINATION:  Blood pressure (!) 157/57, pulse 62, temperature (!) 97.4 F (36.3 C), resp. rate 20, weight 258 lb 9.6 oz (117.3 kg), SpO2 100 %.  ECOG PERFORMANCE STATUS: 1  Physical Exam  Constitutional: Oriented to person, place, and time and well-developed, well-nourished, and in no distress.  HENT:  Head: Normocephalic and atraumatic.  Mouth/Throat: Oropharynx is clear and moist. No oropharyngeal exudate.  Eyes: Conjunctivae are normal. Right eye exhibits no discharge. Left eye exhibits no discharge. No scleral icterus.  Neck: Normal range of motion. Neck supple.  Cardiovascular: Normal rate, regular rhythm, normal heart sounds and intact distal pulses.   Pulmonary/Chest: Effort normal and breath sounds normal. No respiratory distress. No wheezes. No rales.  Abdominal: Soft. Bowel sounds are normal. Exhibits no distension and no mass. There is no tenderness.  Musculoskeletal: Normal range of motion. Exhibits no edema.  Lymphadenopathy:    No cervical adenopathy.  Neurological: Alert and oriented to person, place, and time. Exhibits normal muscle tone. Gait normal. Coordination normal.  Skin: Skin is warm and dry. No rash noted. Not diaphoretic. No erythema. No pallor.  Psychiatric: Mood, memory and judgment normal.  Vitals reviewed.    LABORATORY DATA: Lab Results  Component Value Date   WBC 4.1 11/29/2020   HGB 10.6 (L) 11/29/2020   HCT 33.0 (L) 11/29/2020   MCV 90.7 11/29/2020   PLT 110 (L) 11/29/2020      Chemistry      Component Value Date/Time   NA 138 11/29/2020 1421   NA 138 11/14/2016 0824   K 3.3 (L) 11/29/2020 1421   K 4.3 11/14/2016 0824   CL 106 11/29/2020 1421   CL 104 04/07/2012 1415   CO2 21 (L) 11/29/2020 1421   CO2 23 11/14/2016 0824   BUN 9 11/29/2020 1421   BUN 9.1  11/14/2016 0824   CREATININE 0.80 11/29/2020 1421   CREATININE 0.7 11/14/2016 0824      Component Value Date/Time   CALCIUM 8.7 (L) 11/29/2020 1421   CALCIUM 9.6 11/14/2016 0824   ALKPHOS 144 (H) 11/29/2020 1421   ALKPHOS 94 11/14/2016 0824   AST 36 11/29/2020  1421   AST 28 11/14/2016 0824   ALT 31 11/29/2020 1421   ALT 24 11/14/2016 0824   BILITOT 0.6 11/29/2020 1421   BILITOT 0.41 11/14/2016 0824       RADIOGRAPHIC STUDIES:  No results found.   ASSESSMENT/PLAN:  This is a very pleasant 60 year old Caucasian female with smoldering multiple myeloma with questionable POEMS syndrome. Her myeloma panel showed continuous increase in her free lambda light chain.    The patient previously underwent 107 cycles of subcutaneous weekly Velcade, as well as Revlimid and Decadron on and off over the last several years.   She had been off treatment since June 2021 until July 2022 in which she had evidence of disease progression.  Therefore, she was restarted on Velcade, Revlimid, and Decadron.  She has status post 16 cycles since restarting (cycle 107) she tolerated well without any concerning adverse side effects.  Labs reviewed. I recommend that she proceed with cycle #122 today as scheduled.   I will arrange for a repeat myeloma panel the week before her next provider visit.   We will see her back for follow-up visit in 3 weeks for evaluation before starting cycle #125 so we can see her before she travels out of town for the holiday.   She has some potassium at home and is wondering if she needs to take it for her recent diarrhea. Her potassium is slightly low at 3.3. Discussed she can take 1 20 meq tablet daily for the next 1-3 days.   Advised to call if she develops and recurring symptoms of the chills and fatigue which occurred about 2 weeks ago but resolved. We will monitor for now.    The patient was advised to call immediately if she has any concerning symptoms in the  interval. The patient voices understanding of current disease status and treatment options and is in agreement with the current care plan. All questions were answered. The patient knows to call the clinic with any problems, questions or concerns. We can certainly see the patient much sooner if necessary       Orders Placed This Encounter  Procedures   Lactate dehydrogenase (LDH)    Standing Status:   Future    Standing Expiration Date:   11/29/2021   Kappa/lambda light chains    Standing Status:   Future    Standing Expiration Date:   11/29/2021   Beta 2 microglobulin    Standing Status:   Future    Standing Expiration Date:   11/29/2021   QIG  (Quant. immunoglobulins  - IgG, IgA, IgM)    Standing Status:   Future    Standing Expiration Date:   11/29/2021     The total time spent in the appointment was 20-29 minutes.   [place], PA-C 11/29/20

## 2020-11-22 NOTE — Patient Instructions (Signed)
[facility]  Discharge Instructions: Thank you for choosing [facility] to provide your oncology and hematology care.   If you have a lab appointment with the [facility], please go directly to the [facility] and check in at the registration area.   Wear comfortable clothing and clothing appropriate for easy access to any Portacath or PICC line.   We strive to give you quality time with your provider. You may need to reschedule your appointment if you arrive late (15 or more minutes).  Arriving late affects you and other patients whose appointments are after yours.  Also, if you miss three or more appointments without notifying the office, you may be dismissed from the clinic at the provider's discretion.      For prescription refill requests, have your pharmacy contact our office and allow 72 hours for refills to be completed.    Today you received the following chemotherapy and/or immunotherapy agents velcade       To help prevent nausea and vomiting after your treatment, we encourage you to take your nausea medication as directed.  BELOW ARE SYMPTOMS THAT SHOULD BE REPORTED IMMEDIATELY: *FEVER GREATER THAN 100.4 F (38 C) OR HIGHER *CHILLS OR SWEATING *NAUSEA AND VOMITING THAT IS NOT CONTROLLED WITH YOUR NAUSEA MEDICATION *UNUSUAL SHORTNESS OF BREATH *UNUSUAL BRUISING OR BLEEDING *URINARY PROBLEMS (pain or burning when urinating, or frequent urination) *BOWEL PROBLEMS (unusual diarrhea, constipation, pain near the anus) TENDERNESS IN MOUTH AND THROAT WITH OR WITHOUT PRESENCE OF ULCERS (sore throat, sores in mouth, or a toothache) UNUSUAL RASH, SWELLING OR PAIN  UNUSUAL VAGINAL DISCHARGE OR ITCHING   Items with * indicate a potential emergency and should be followed up as soon as possible or go to the Emergency Department if any problems should occur.  Please show the CHEMOTHERAPY ALERT CARD or IMMUNOTHERAPY ALERT CARD at check-in to  the Emergency Department and triage nurse.  Should you have questions after your visit or need to cancel or reschedule your appointment, please contact [facility]  Dept: [phone]  and follow the prompts.  Office hours are 8:00 a.m. to 4:30 p.m. Monday - Friday. Please note that voicemails left after 4:00 p.m. may not be returned until the following business day.  We are closed weekends and major holidays. You have access to a nurse at all times for urgent questions. Please call the main number to the clinic Dept: [phone] and follow the prompts.   For any non-urgent questions, you may also contact your provider using MyChart. We now offer e-Visits for anyone 69 and older to request care online for non-urgent symptoms. For details visit [email or web address].   Also download the MyChart app! Go to the app store, search "MyChart", open the app, select [facility], and log in with your MyChart username and password.  Due to Covid, a mask is required upon entering the hospital/clinic. If you do not have a mask, one will be given to you upon arrival. For doctor visits, patients may have 1 support person aged 29 or older with them. For treatment visits, patients cannot have anyone with them due to current Covid guidelines and our immunocompromised population.

## 2020-11-29 ENCOUNTER — Inpatient Hospital Stay: Payer: 59

## 2020-11-29 ENCOUNTER — Inpatient Hospital Stay (HOSPITAL_BASED_OUTPATIENT_CLINIC_OR_DEPARTMENT_OTHER): Payer: 59 | Admitting: Physician Assistant

## 2020-11-29 ENCOUNTER — Other Ambulatory Visit: Payer: Self-pay

## 2020-11-29 VITALS — BP 135/59 | HR 61 | Temp 97.3°F | Resp 16

## 2020-11-29 VITALS — BP 157/57 | HR 62 | Temp 97.4°F | Resp 20 | Wt 258.6 lb

## 2020-11-29 DIAGNOSIS — D472 Monoclonal gammopathy: Secondary | ICD-10-CM

## 2020-11-29 DIAGNOSIS — Z5112 Encounter for antineoplastic immunotherapy: Secondary | ICD-10-CM | POA: Diagnosis not present

## 2020-11-29 DIAGNOSIS — C9000 Multiple myeloma not having achieved remission: Secondary | ICD-10-CM

## 2020-11-29 LAB — CBC WITH DIFFERENTIAL (CANCER CENTER ONLY)
Abs Immature Granulocytes: 0.02 10*3/uL (ref 0.00–0.07)
Basophils Absolute: 0 10*3/uL (ref 0.0–0.1)
Basophils Relative: 1 %
Eosinophils Absolute: 0.1 10*3/uL (ref 0.0–0.5)
Eosinophils Relative: 3 %
HCT: 33 % — ABNORMAL LOW (ref 36.0–46.0)
Hemoglobin: 10.6 g/dL — ABNORMAL LOW (ref 12.0–15.0)
Immature Granulocytes: 1 %
Lymphocytes Relative: 16 %
Lymphs Abs: 0.6 10*3/uL — ABNORMAL LOW (ref 0.7–4.0)
MCH: 29.1 pg (ref 26.0–34.0)
MCHC: 32.1 g/dL (ref 30.0–36.0)
MCV: 90.7 fL (ref 80.0–100.0)
Monocytes Absolute: 0.4 10*3/uL (ref 0.1–1.0)
Monocytes Relative: 10 %
Neutro Abs: 2.9 10*3/uL (ref 1.7–7.7)
Neutrophils Relative %: 69 %
Platelet Count: 110 10*3/uL — ABNORMAL LOW (ref 150–400)
RBC: 3.64 MIL/uL — ABNORMAL LOW (ref 3.87–5.11)
RDW: 15.5 % (ref 11.5–15.5)
WBC Count: 4.1 10*3/uL (ref 4.0–10.5)
nRBC: 0 % (ref 0.0–0.2)

## 2020-11-29 LAB — CMP (CANCER CENTER ONLY)
ALT: 31 U/L (ref 0–44)
AST: 36 U/L (ref 15–41)
Albumin: 3.7 g/dL (ref 3.5–5.0)
Alkaline Phosphatase: 144 U/L — ABNORMAL HIGH (ref 38–126)
Anion gap: 11 (ref 5–15)
BUN: 9 mg/dL (ref 6–20)
CO2: 21 mmol/L — ABNORMAL LOW (ref 22–32)
Calcium: 8.7 mg/dL — ABNORMAL LOW (ref 8.9–10.3)
Chloride: 106 mmol/L (ref 98–111)
Creatinine: 0.8 mg/dL (ref 0.44–1.00)
GFR, Estimated: >60 mL/min (ref >60)
Glucose, Bld: 187 mg/dL — ABNORMAL HIGH (ref 70–99)
Potassium: 3.3 mmol/L — ABNORMAL LOW (ref 3.5–5.1)
Sodium: 138 mmol/L (ref 135–145)
Total Bilirubin: 0.6 mg/dL (ref 0.3–1.2)
Total Protein: 6.8 g/dL (ref 6.5–8.1)

## 2020-11-29 MED ORDER — BORTEZOMIB CHEMO SQ INJECTION 3.5 MG (2.5MG/ML)
1.3000 mg/m2 | Freq: Once | INTRAMUSCULAR | Status: AC
  Administered 2020-11-29: 3 mg via SUBCUTANEOUS
  Filled 2020-11-29: qty 1.2

## 2020-11-29 MED ORDER — ONDANSETRON HCL 8 MG PO TABS
8.0000 mg | ORAL_TABLET | Freq: Once | ORAL | Status: AC
  Administered 2020-11-29: 8 mg via ORAL
  Filled 2020-11-29: qty 1

## 2020-11-29 NOTE — Patient Instructions (Signed)
[facility]   ?Discharge Instructions: ?Thank you for choosing [facility] to provide your oncology and hematology care.  ? ?If you have a lab appointment with the Cancer Center, please go directly to the Cancer Center and check in at the registration area. ?  ?Wear comfortable clothing and clothing appropriate for easy access to any Portacath or PICC line.  ? ?We strive to give you quality time with your provider. You may need to reschedule your appointment if you arrive late (15 or more minutes).  Arriving late affects you and other patients whose appointments are after yours.  Also, if you miss three or more appointments without notifying the office, you may be dismissed from the clinic at the provider?s discretion.    ?  ?For prescription refill requests, have your pharmacy contact our office and allow 72 hours for refills to be completed.   ? ?Today you received the following chemotherapy and/or immunotherapy agents: bortezomib    ?  ?To help prevent nausea and vomiting after your treatment, we encourage you to take your nausea medication as directed. ? ?BELOW ARE SYMPTOMS THAT SHOULD BE REPORTED IMMEDIATELY: ?*FEVER GREATER THAN 100.4 F (38 ?C) OR HIGHER ?*CHILLS OR SWEATING ?*NAUSEA AND VOMITING THAT IS NOT CONTROLLED WITH YOUR NAUSEA MEDICATION ?*UNUSUAL SHORTNESS OF BREATH ?*UNUSUAL BRUISING OR BLEEDING ?*URINARY PROBLEMS (pain or burning when urinating, or frequent urination) ?*BOWEL PROBLEMS (unusual diarrhea, constipation, pain near the anus) ?TENDERNESS IN MOUTH AND THROAT WITH OR WITHOUT PRESENCE OF ULCERS (sore throat, sores in mouth, or a toothache) ?UNUSUAL RASH, SWELLING OR PAIN  ?UNUSUAL VAGINAL DISCHARGE OR ITCHING  ? ?Items with * indicate a potential emergency and should be followed up as soon as possible or go to the Emergency Department if any problems should occur. ? ?Please show the CHEMOTHERAPY ALERT CARD or IMMUNOTHERAPY ALERT CARD at check-in  to the Emergency Department and triage nurse. ? ?Should you have questions after your visit or need to cancel or reschedule your appointment, please contact [facility]  Dept: [phone]  and follow the prompts.  Office hours are 8:00 a.m. to 4:30 p.m. Monday - Friday. Please note that voicemails left after 4:00 p.m. may not be returned until the following business day.  We are closed weekends and major holidays. You have access to a nurse at all times for urgent questions. Please call the main number to the clinic Dept: [phone] and follow the prompts. ? ? ?For any non-urgent questions, you may also contact your provider using MyChart. We now offer e-Visits for anyone 18 and older to request care online for non-urgent symptoms. For details visit [email or web address]. ?  ?Also download the MyChart app! Go to the app store, search "MyChart", open the app, select [facility], and log in with your MyChart username and password. ? ?Due to Covid, a mask is required upon entering the hospital/clinic. If you do not have a mask, one will be given to you upon arrival. For doctor visits, patients may have 1 support person aged 18 or older with them. For treatment visits, patients cannot have anyone with them due to current Covid guidelines and our immunocompromised population.  ? ?

## 2020-11-30 ENCOUNTER — Encounter: Payer: Self-pay | Admitting: Internal Medicine

## 2020-12-06 ENCOUNTER — Other Ambulatory Visit: Payer: Self-pay

## 2020-12-06 ENCOUNTER — Inpatient Hospital Stay: Payer: 59

## 2020-12-06 VITALS — BP 136/65 | HR 58 | Temp 98.6°F | Resp 16 | Ht 67.5 in | Wt 259.5 lb

## 2020-12-06 DIAGNOSIS — D472 Monoclonal gammopathy: Secondary | ICD-10-CM

## 2020-12-06 DIAGNOSIS — C9000 Multiple myeloma not having achieved remission: Secondary | ICD-10-CM

## 2020-12-06 DIAGNOSIS — Z5112 Encounter for antineoplastic immunotherapy: Secondary | ICD-10-CM | POA: Diagnosis not present

## 2020-12-06 LAB — CMP (CANCER CENTER ONLY)
ALT: 33 U/L (ref 0–44)
AST: 43 U/L — ABNORMAL HIGH (ref 15–41)
Albumin: 4 g/dL (ref 3.5–5.0)
Alkaline Phosphatase: 125 U/L (ref 38–126)
Anion gap: 8 (ref 5–15)
BUN: 11 mg/dL (ref 6–20)
CO2: 24 mmol/L (ref 22–32)
Calcium: 8.5 mg/dL — ABNORMAL LOW (ref 8.9–10.3)
Chloride: 105 mmol/L (ref 98–111)
Creatinine: 0.69 mg/dL (ref 0.44–1.00)
GFR, Estimated: >60 mL/min (ref >60)
Glucose, Bld: 170 mg/dL — ABNORMAL HIGH (ref 70–99)
Potassium: 3.5 mmol/L (ref 3.5–5.1)
Sodium: 137 mmol/L (ref 135–145)
Total Bilirubin: 0.7 mg/dL (ref 0.3–1.2)
Total Protein: 6.8 g/dL (ref 6.5–8.1)

## 2020-12-06 LAB — CBC WITH DIFFERENTIAL (CANCER CENTER ONLY)
Abs Immature Granulocytes: 0.01 10*3/uL (ref 0.00–0.07)
Basophils Absolute: 0 10*3/uL (ref 0.0–0.1)
Basophils Relative: 1 %
Eosinophils Absolute: 0.1 10*3/uL (ref 0.0–0.5)
Eosinophils Relative: 6 %
HCT: 31.1 % — ABNORMAL LOW (ref 36.0–46.0)
Hemoglobin: 10.4 g/dL — ABNORMAL LOW (ref 12.0–15.0)
Immature Granulocytes: 0 %
Lymphocytes Relative: 20 %
Lymphs Abs: 0.5 10*3/uL — ABNORMAL LOW (ref 0.7–4.0)
MCH: 29.9 pg (ref 26.0–34.0)
MCHC: 33.4 g/dL (ref 30.0–36.0)
MCV: 89.4 fL (ref 80.0–100.0)
Monocytes Absolute: 0.3 10*3/uL (ref 0.1–1.0)
Monocytes Relative: 10 %
Neutro Abs: 1.6 10*3/uL — ABNORMAL LOW (ref 1.7–7.7)
Neutrophils Relative %: 63 %
Platelet Count: 87 10*3/uL — ABNORMAL LOW (ref 150–400)
RBC: 3.48 MIL/uL — ABNORMAL LOW (ref 3.87–5.11)
RDW: 15.6 % — ABNORMAL HIGH (ref 11.5–15.5)
WBC Count: 2.5 10*3/uL — ABNORMAL LOW (ref 4.0–10.5)
nRBC: 0 % (ref 0.0–0.2)

## 2020-12-06 LAB — LACTATE DEHYDROGENASE: LDH: 96 U/L — ABNORMAL LOW (ref 98–192)

## 2020-12-06 MED ORDER — ONDANSETRON HCL 8 MG PO TABS
8.0000 mg | ORAL_TABLET | Freq: Once | ORAL | Status: AC
  Administered 2020-12-06: 8 mg via ORAL

## 2020-12-06 MED ORDER — BORTEZOMIB CHEMO SQ INJECTION 3.5 MG (2.5MG/ML)
1.3000 mg/m2 | Freq: Once | INTRAMUSCULAR | Status: AC
  Administered 2020-12-06: 3 mg via SUBCUTANEOUS
  Filled 2020-12-06: qty 1.2

## 2020-12-06 NOTE — Patient Instructions (Signed)
[facility]   ?Discharge Instructions: ?Thank you for choosing [facility] to provide your oncology and hematology care.  ? ?If you have a lab appointment with the Cancer Center, please go directly to the Cancer Center and check in at the registration area. ?  ?Wear comfortable clothing and clothing appropriate for easy access to any Portacath or PICC line.  ? ?We strive to give you quality time with your provider. You may need to reschedule your appointment if you arrive late (15 or more minutes).  Arriving late affects you and other patients whose appointments are after yours.  Also, if you miss three or more appointments without notifying the office, you may be dismissed from the clinic at the provider?s discretion.    ?  ?For prescription refill requests, have your pharmacy contact our office and allow 72 hours for refills to be completed.   ? ?Today you received the following chemotherapy and/or immunotherapy agents: bortezomib    ?  ?To help prevent nausea and vomiting after your treatment, we encourage you to take your nausea medication as directed. ? ?BELOW ARE SYMPTOMS THAT SHOULD BE REPORTED IMMEDIATELY: ?*FEVER GREATER THAN 100.4 F (38 ?C) OR HIGHER ?*CHILLS OR SWEATING ?*NAUSEA AND VOMITING THAT IS NOT CONTROLLED WITH YOUR NAUSEA MEDICATION ?*UNUSUAL SHORTNESS OF BREATH ?*UNUSUAL BRUISING OR BLEEDING ?*URINARY PROBLEMS (pain or burning when urinating, or frequent urination) ?*BOWEL PROBLEMS (unusual diarrhea, constipation, pain near the anus) ?TENDERNESS IN MOUTH AND THROAT WITH OR WITHOUT PRESENCE OF ULCERS (sore throat, sores in mouth, or a toothache) ?UNUSUAL RASH, SWELLING OR PAIN  ?UNUSUAL VAGINAL DISCHARGE OR ITCHING  ? ?Items with * indicate a potential emergency and should be followed up as soon as possible or go to the Emergency Department if any problems should occur. ? ?Please show the CHEMOTHERAPY ALERT CARD or IMMUNOTHERAPY ALERT CARD at check-in  to the Emergency Department and triage nurse. ? ?Should you have questions after your visit or need to cancel or reschedule your appointment, please contact [facility]  Dept: [phone]  and follow the prompts.  Office hours are 8:00 a.m. to 4:30 p.m. Monday - Friday. Please note that voicemails left after 4:00 p.m. may not be returned until the following business day.  We are closed weekends and major holidays. You have access to a nurse at all times for urgent questions. Please call the main number to the clinic Dept: [phone] and follow the prompts. ? ? ?For any non-urgent questions, you may also contact your provider using MyChart. We now offer e-Visits for anyone 18 and older to request care online for non-urgent symptoms. For details visit [email or web address]. ?  ?Also download the MyChart app! Go to the app store, search "MyChart", open the app, select [facility], and log in with your MyChart username and password. ? ?Due to Covid, a mask is required upon entering the hospital/clinic. If you do not have a mask, one will be given to you upon arrival. For doctor visits, patients may have 1 support person aged 18 or older with them. For treatment visits, patients cannot have anyone with them due to current Covid guidelines and our immunocompromised population.  ? ?

## 2020-12-06 NOTE — Progress Notes (Signed)
Ok to treat today with platelets 87 per Dr. Julien Nordmann.

## 2020-12-07 ENCOUNTER — Other Ambulatory Visit: Payer: Self-pay | Admitting: Internal Medicine

## 2020-12-07 ENCOUNTER — Other Ambulatory Visit: Payer: Self-pay

## 2020-12-07 ENCOUNTER — Telehealth: Payer: Self-pay

## 2020-12-07 DIAGNOSIS — C9000 Multiple myeloma not having achieved remission: Secondary | ICD-10-CM

## 2020-12-07 LAB — IGG, IGA, IGM
IgA: 148 mg/dL (ref 87–352)
IgG (Immunoglobin G), Serum: 481 mg/dL — ABNORMAL LOW (ref 586–1602)
IgM (Immunoglobulin M), Srm: 611 mg/dL — ABNORMAL HIGH (ref 26–217)

## 2020-12-07 LAB — KAPPA/LAMBDA LIGHT CHAINS
Kappa free light chain: 18.7 mg/L (ref 3.3–19.4)
Kappa, lambda light chain ratio: 0.03 — ABNORMAL LOW (ref 0.26–1.65)
Lambda free light chains: 684.7 mg/L — ABNORMAL HIGH (ref 5.7–26.3)

## 2020-12-07 LAB — BETA 2 MICROGLOBULIN, SERUM: Beta-2 Microglobulin: 1.8 mg/L (ref 0.6–2.4)

## 2020-12-07 MED ORDER — REVLIMID 25 MG PO CAPS: ORAL_CAPSULE | ORAL | 0 refills | Status: DC

## 2020-12-07 NOTE — Telephone Encounter (Signed)
Revlimid refill needed.

## 2020-12-13 ENCOUNTER — Inpatient Hospital Stay: Payer: 59

## 2020-12-13 ENCOUNTER — Inpatient Hospital Stay: Payer: 59 | Attending: Physician Assistant

## 2020-12-13 ENCOUNTER — Other Ambulatory Visit: Payer: Self-pay

## 2020-12-13 VITALS — BP 145/68 | HR 56 | Temp 97.7°F | Resp 16 | Wt 258.8 lb

## 2020-12-13 DIAGNOSIS — Z7961 Long term (current) use of immunomodulator: Secondary | ICD-10-CM | POA: Insufficient documentation

## 2020-12-13 DIAGNOSIS — Z7952 Long term (current) use of systemic steroids: Secondary | ICD-10-CM | POA: Diagnosis not present

## 2020-12-13 DIAGNOSIS — D509 Iron deficiency anemia, unspecified: Secondary | ICD-10-CM | POA: Insufficient documentation

## 2020-12-13 DIAGNOSIS — Z5112 Encounter for antineoplastic immunotherapy: Secondary | ICD-10-CM | POA: Insufficient documentation

## 2020-12-13 DIAGNOSIS — Z885 Allergy status to narcotic agent status: Secondary | ICD-10-CM | POA: Insufficient documentation

## 2020-12-13 DIAGNOSIS — Z9049 Acquired absence of other specified parts of digestive tract: Secondary | ICD-10-CM | POA: Diagnosis not present

## 2020-12-13 DIAGNOSIS — Z79899 Other long term (current) drug therapy: Secondary | ICD-10-CM | POA: Diagnosis not present

## 2020-12-13 DIAGNOSIS — R5383 Other fatigue: Secondary | ICD-10-CM | POA: Diagnosis not present

## 2020-12-13 DIAGNOSIS — Z880 Allergy status to penicillin: Secondary | ICD-10-CM | POA: Diagnosis not present

## 2020-12-13 DIAGNOSIS — C9030 Solitary plasmacytoma not having achieved remission: Secondary | ICD-10-CM | POA: Insufficient documentation

## 2020-12-13 DIAGNOSIS — C9000 Multiple myeloma not having achieved remission: Secondary | ICD-10-CM

## 2020-12-13 LAB — CMP (CANCER CENTER ONLY)
ALT: 25 U/L (ref 0–44)
AST: 33 U/L (ref 15–41)
Albumin: 3.6 g/dL (ref 3.5–5.0)
Alkaline Phosphatase: 133 U/L — ABNORMAL HIGH (ref 38–126)
Anion gap: 12 (ref 5–15)
BUN: 10 mg/dL (ref 6–20)
CO2: 19 mmol/L — ABNORMAL LOW (ref 22–32)
Calcium: 8.6 mg/dL — ABNORMAL LOW (ref 8.9–10.3)
Chloride: 109 mmol/L (ref 98–111)
Creatinine: 0.78 mg/dL (ref 0.44–1.00)
GFR, Estimated: >60 mL/min
Glucose, Bld: 140 mg/dL — ABNORMAL HIGH (ref 70–99)
Potassium: 3.7 mmol/L (ref 3.5–5.1)
Sodium: 140 mmol/L (ref 135–145)
Total Bilirubin: 0.7 mg/dL (ref 0.3–1.2)
Total Protein: 6.6 g/dL (ref 6.5–8.1)

## 2020-12-13 LAB — CBC WITH DIFFERENTIAL (CANCER CENTER ONLY)
Abs Immature Granulocytes: 0.01 10*3/uL (ref 0.00–0.07)
Basophils Absolute: 0.1 10*3/uL (ref 0.0–0.1)
Basophils Relative: 2 %
Eosinophils Absolute: 0 10*3/uL (ref 0.0–0.5)
Eosinophils Relative: 1 %
HCT: 31.4 % — ABNORMAL LOW (ref 36.0–46.0)
Hemoglobin: 10.2 g/dL — ABNORMAL LOW (ref 12.0–15.0)
Immature Granulocytes: 0 %
Lymphocytes Relative: 26 %
Lymphs Abs: 0.8 10*3/uL (ref 0.7–4.0)
MCH: 29.7 pg (ref 26.0–34.0)
MCHC: 32.5 g/dL (ref 30.0–36.0)
MCV: 91.3 fL (ref 80.0–100.0)
Monocytes Absolute: 0.3 10*3/uL (ref 0.1–1.0)
Monocytes Relative: 10 %
Neutro Abs: 1.9 10*3/uL (ref 1.7–7.7)
Neutrophils Relative %: 61 %
Platelet Count: 105 10*3/uL — ABNORMAL LOW (ref 150–400)
RBC: 3.44 MIL/uL — ABNORMAL LOW (ref 3.87–5.11)
RDW: 15.6 % — ABNORMAL HIGH (ref 11.5–15.5)
WBC Count: 3.1 10*3/uL — ABNORMAL LOW (ref 4.0–10.5)
nRBC: 0 % (ref 0.0–0.2)

## 2020-12-13 MED ORDER — ONDANSETRON HCL 8 MG PO TABS
8.0000 mg | ORAL_TABLET | Freq: Once | ORAL | Status: AC
  Administered 2020-12-13: 8 mg via ORAL
  Filled 2020-12-13: qty 1

## 2020-12-13 MED ORDER — BORTEZOMIB CHEMO SQ INJECTION 3.5 MG (2.5MG/ML)
1.3000 mg/m2 | Freq: Once | INTRAMUSCULAR | Status: AC
  Administered 2020-12-13: 3 mg via SUBCUTANEOUS
  Filled 2020-12-13: qty 1.2

## 2020-12-20 ENCOUNTER — Inpatient Hospital Stay (HOSPITAL_BASED_OUTPATIENT_CLINIC_OR_DEPARTMENT_OTHER): Payer: 59 | Admitting: Internal Medicine

## 2020-12-20 ENCOUNTER — Inpatient Hospital Stay: Payer: 59

## 2020-12-20 ENCOUNTER — Encounter: Payer: Self-pay | Admitting: Internal Medicine

## 2020-12-20 ENCOUNTER — Other Ambulatory Visit: Payer: Self-pay

## 2020-12-20 VITALS — BP 145/51 | HR 70 | Temp 97.5°F | Resp 19 | Wt 255.7 lb

## 2020-12-20 DIAGNOSIS — Z5112 Encounter for antineoplastic immunotherapy: Secondary | ICD-10-CM | POA: Diagnosis not present

## 2020-12-20 DIAGNOSIS — C9000 Multiple myeloma not having achieved remission: Secondary | ICD-10-CM

## 2020-12-20 DIAGNOSIS — Z5111 Encounter for antineoplastic chemotherapy: Secondary | ICD-10-CM

## 2020-12-20 LAB — CMP (CANCER CENTER ONLY)
ALT: 28 U/L (ref 0–44)
AST: 35 U/L (ref 15–41)
Albumin: 3.7 g/dL (ref 3.5–5.0)
Alkaline Phosphatase: 129 U/L — ABNORMAL HIGH (ref 38–126)
Anion gap: 10 (ref 5–15)
BUN: 10 mg/dL (ref 6–20)
CO2: 22 mmol/L (ref 22–32)
Calcium: 8.7 mg/dL — ABNORMAL LOW (ref 8.9–10.3)
Chloride: 107 mmol/L (ref 98–111)
Creatinine: 0.8 mg/dL (ref 0.44–1.00)
GFR, Estimated: >60 mL/min (ref >60)
Glucose, Bld: 186 mg/dL — ABNORMAL HIGH (ref 70–99)
Potassium: 3.5 mmol/L (ref 3.5–5.1)
Sodium: 139 mmol/L (ref 135–145)
Total Bilirubin: 0.8 mg/dL (ref 0.3–1.2)
Total Protein: 6.8 g/dL (ref 6.5–8.1)

## 2020-12-20 LAB — CBC WITH DIFFERENTIAL (CANCER CENTER ONLY)
Abs Immature Granulocytes: 0.01 10*3/uL (ref 0.00–0.07)
Basophils Absolute: 0 10*3/uL (ref 0.0–0.1)
Basophils Relative: 1 %
Eosinophils Absolute: 0.1 10*3/uL (ref 0.0–0.5)
Eosinophils Relative: 2 %
HCT: 33 % — ABNORMAL LOW (ref 36.0–46.0)
Hemoglobin: 10.7 g/dL — ABNORMAL LOW (ref 12.0–15.0)
Immature Granulocytes: 0 %
Lymphocytes Relative: 23 %
Lymphs Abs: 0.6 10*3/uL — ABNORMAL LOW (ref 0.7–4.0)
MCH: 29.4 pg (ref 26.0–34.0)
MCHC: 32.4 g/dL (ref 30.0–36.0)
MCV: 90.7 fL (ref 80.0–100.0)
Monocytes Absolute: 0.2 10*3/uL (ref 0.1–1.0)
Monocytes Relative: 7 %
Neutro Abs: 1.7 10*3/uL (ref 1.7–7.7)
Neutrophils Relative %: 67 %
Platelet Count: 102 10*3/uL — ABNORMAL LOW (ref 150–400)
RBC: 3.64 MIL/uL — ABNORMAL LOW (ref 3.87–5.11)
RDW: 15.9 % — ABNORMAL HIGH (ref 11.5–15.5)
WBC Count: 2.6 10*3/uL — ABNORMAL LOW (ref 4.0–10.5)
nRBC: 0 % (ref 0.0–0.2)

## 2020-12-20 MED ORDER — BORTEZOMIB CHEMO SQ INJECTION 3.5 MG (2.5MG/ML)
1.3000 mg/m2 | Freq: Once | INTRAMUSCULAR | Status: AC
  Administered 2020-12-20: 3 mg via SUBCUTANEOUS
  Filled 2020-12-20: qty 1.2

## 2020-12-20 MED ORDER — ONDANSETRON HCL 8 MG PO TABS
8.0000 mg | ORAL_TABLET | Freq: Once | ORAL | Status: AC
  Administered 2020-12-20: 8 mg via ORAL
  Filled 2020-12-20: qty 1

## 2020-12-20 NOTE — Patient Instructions (Signed)
[facility]   ?Discharge Instructions: ?Thank you for choosing [facility] to provide your oncology and hematology care.  ? ?If you have a lab appointment with the Cancer Center, please go directly to the Cancer Center and check in at the registration area. ?  ?Wear comfortable clothing and clothing appropriate for easy access to any Portacath or PICC line.  ? ?We strive to give you quality time with your provider. You may need to reschedule your appointment if you arrive late (15 or more minutes).  Arriving late affects you and other patients whose appointments are after yours.  Also, if you miss three or more appointments without notifying the office, you may be dismissed from the clinic at the provider?s discretion.    ?  ?For prescription refill requests, have your pharmacy contact our office and allow 72 hours for refills to be completed.   ? ?Today you received the following chemotherapy and/or immunotherapy agents: bortezomib    ?  ?To help prevent nausea and vomiting after your treatment, we encourage you to take your nausea medication as directed. ? ?BELOW ARE SYMPTOMS THAT SHOULD BE REPORTED IMMEDIATELY: ?*FEVER GREATER THAN 100.4 F (38 ?C) OR HIGHER ?*CHILLS OR SWEATING ?*NAUSEA AND VOMITING THAT IS NOT CONTROLLED WITH YOUR NAUSEA MEDICATION ?*UNUSUAL SHORTNESS OF BREATH ?*UNUSUAL BRUISING OR BLEEDING ?*URINARY PROBLEMS (pain or burning when urinating, or frequent urination) ?*BOWEL PROBLEMS (unusual diarrhea, constipation, pain near the anus) ?TENDERNESS IN MOUTH AND THROAT WITH OR WITHOUT PRESENCE OF ULCERS (sore throat, sores in mouth, or a toothache) ?UNUSUAL RASH, SWELLING OR PAIN  ?UNUSUAL VAGINAL DISCHARGE OR ITCHING  ? ?Items with * indicate a potential emergency and should be followed up as soon as possible or go to the Emergency Department if any problems should occur. ? ?Please show the CHEMOTHERAPY ALERT CARD or IMMUNOTHERAPY ALERT CARD at check-in  to the Emergency Department and triage nurse. ? ?Should you have questions after your visit or need to cancel or reschedule your appointment, please contact [facility]  Dept: [phone]  and follow the prompts.  Office hours are 8:00 a.m. to 4:30 p.m. Monday - Friday. Please note that voicemails left after 4:00 p.m. may not be returned until the following business day.  We are closed weekends and major holidays. You have access to a nurse at all times for urgent questions. Please call the main number to the clinic Dept: [phone] and follow the prompts. ? ? ?For any non-urgent questions, you may also contact your provider using MyChart. We now offer e-Visits for anyone 18 and older to request care online for non-urgent symptoms. For details visit [email or web address]. ?  ?Also download the MyChart app! Go to the app store, search "MyChart", open the app, select [facility], and log in with your MyChart username and password. ? ?Due to Covid, a mask is required upon entering the hospital/clinic. If you do not have a mask, one will be given to you upon arrival. For doctor visits, patients may have 1 support person aged 18 or older with them. For treatment visits, patients cannot have anyone with them due to current Covid guidelines and our immunocompromised population.  ? ?

## 2020-12-20 NOTE — Progress Notes (Signed)
[facility] [phone]   [phone]  OFFICE PROGRESS NOTE  [name], [place] [id number]  DIAGNOSIS: Plasma cell dyscrasia initially diagnosed as MGUS in September 2010, with additional symptoms suggestive of POEMS syndrome.   PRIOR THERAPY:  1) Velcade 1.3 MG/M2 subcutaneously with Decadron 40 mg by mouth on a weekly basis. First cycle 11/24/2013. She status post 31 weekly doses of treatment. 2) Velcade 1.3 MG/M2 subcutaneously and weekly basis with Decadron 40 mg by mouth weekly. First dose 02/01/2015. Status post 28 cycles. 3) Revlimid 25 mg by mouth daily for 21 days every 4 weeks with weekly Decadron 20 mg. started in 11/27/2015. Status post 3 cycles discontinued secondary to lack of response.  CURRENT THERAPY: Systemic treatment with Velcade 1.3 MG/KG weekly, Revlimid 25 mg by mouth daily for 21 days every 4 weeks in addition to Decadron 20 mg by mouth weekly. First dose 03/06/2016. Status post 36 cycles.  She has a break off treatment from June 2021 until July 2022.  INTERVAL HISTORY: Brenda Conner 60 y.o. female returns to the clinic today for follow-up visit.  The patient is feeling fine today with no concerning complaints except for mild fatigue.  She has been tolerating her treatment with subcutaneous Velcade, Revlimid and Decadron fairly well.  She denied having any chest pain, shortness of breath, cough or hemoptysis.  She denied having any fever or chills.  She has no nausea, vomiting, diarrhea or constipation.  She has no headache or visual changes.  She is planning to travel to [place] to visit her family and stay there for 2 weeks during the holiday.  She is here today for evaluation and repeat myeloma panel for monitoring of her disease.   MEDICAL HISTORY: Past Medical History:  Diagnosis Date   Acid reflux    Anxiety    Asthma    Cancer ([facility])    waldenstroms/ macroglobinulemia    Depression    Depression    Diabetes mellitus without complication ([facility])    Dysuria 02/28/2016   Hypercholesteremia    Hypertension    Hypothyroidism    Macroglobulinemia ([facility])    ? POEMS syndrome   Multiple myeloma ([facility])    Obesity    PONV (postoperative nausea and vomiting)    Sleep apnea    CPAP at bedtime   Sleep apnea     ALLERGIES:  is allergic to codeine, hydrocodone, lortab [hydrocodone-acetaminophen], onion, shellfish allergy, and amoxicillin.  MEDICATIONS:  Current Outpatient Medications  Medication Sig Dispense Refill   ACCU-CHEK AVIVA PLUS test strip USE TO CHECK SUGAR TWICE A DAY 90     acyclovir (ZOVIRAX) 400 MG tablet Take 1 tablet (400 mg total) by mouth 2 (two) times daily. 180 tablet 1   ALPRAZolam (XANAX) 0.5 MG tablet Take 1 tablet (0.5 mg total) by mouth at bedtime as needed for anxiety. 30 tablet 1   aspirin 81 MG chewable tablet Chew by mouth daily.     Azelastine HCl 0.15 % SOLN as needed.     B-D UF III MINI PEN NEEDLES 31G X 5 MM MISC      Blood Glucose Monitoring Suppl (ONE TOUCH ULTRA MINI) W/DEVICE KIT See admin instructions. Reported on 01/25/2015  0   bortezomib IV (VELCADE) 3.5 MG injection Inject 1.3 mg/m2 into the vein once.     Cetirizine HCl (ZYRTEC ALLERGY PO) Take by mouth daily.  dexamethasone (DECADRON) 4 MG tablet 5 tablets p.o. weekly on the day of chemotherapy. 60 tablet 2   DiphenhydrAMINE HCl (BENADRYL ALLERGY PO) Take by mouth as needed.     EPIPEN 2-PAK 0.3 MG/0.3ML SOAJ injection Reported on 06/21/2015  1   Fexofenadine HCl (ALLEGRA ALLERGY PO) Take by mouth daily.     Fluticasone-Salmeterol (ADVAIR) 100-50 MCG/DOSE AEPB Inhale 2 puffs into the lungs every 12 (twelve) hours.     ibuprofen (ADVIL,MOTRIN) 100 MG tablet Take 100 mg by mouth every 6 (six) hours as needed. Reported on 05/31/2015     Insulin Glargine (BASAGLAR KWIKPEN) 100 UNIT/ML Inject 20 Units into the skin at bedtime.     levothyroxine (SYNTHROID, LEVOTHROID) 150 MCG  tablet Take 150 mcg by mouth daily before breakfast.     lisinopril (PRINIVIL,ZESTRIL) 10 MG tablet Take 10 mg by mouth daily. auth number 05/29/2018 [id number]  3   loperamide (IMODIUM) 2 MG capsule Take 2 mg by mouth as needed for diarrhea or loose stools.     magic mouthwash w/lidocaine SOLN Take 5 mLs by mouth 4 (four) times daily as needed for mouth pain. Swish, Gargle, and spit 240 mL 1   metFORMIN (GLUCOPHAGE-XR) 500 MG 24 hr tablet Take 500 mg by mouth daily.     NOVOLOG FLEXPEN 100 UNIT/ML FlexPen Inject 15 Units into the skin in the morning, at noon, and at bedtime. Per Sliding Scale     omeprazole (PRILOSEC) 40 MG capsule Take 40 mg by mouth 2 (two) times daily.      ondansetron (ZOFRAN-ODT) 8 MG disintegrating tablet Take 1 tablet (8 mg total) by mouth every 8 (eight) hours as needed. Reported on 05/31/2015 30 tablet 2   pravastatin (PRAVACHOL) 40 MG tablet Take 40 mg by mouth every evening.      PROAIR HFA 108 (90 Base) MCG/ACT inhaler Reported on 06/21/2015  1   REVLIMID 25 MG capsule TAKE 1 CAPSULE BY MOUTH ONCE DAILY 21 DAYS ON AND 7 DAYS OFF 12/07/20 Auth number: [id number]. Adult female not of child bearing age. 21 capsule 0   sertraline (ZOLOFT) 50 MG tablet Take 3 tablets (150 mg total) by mouth daily. 270 tablet 3   verapamil (CALAN-SR) 240 MG CR tablet Take 240 mg by mouth 2 (two) times daily.     No current facility-administered medications for this visit.    SURGICAL HISTORY:  Past Surgical History:  Procedure Laterality Date   ABLATION  09/09/2007   HTA and polyp resection   BACK SURGERY  03/08/2004   herniation, L4-L5   Bil Laprascopic knee surgery     BONE MARROW BIOPSY     2011   BONE MARROW BIOPSY  04/08/2012   BONE MARROW BIOPSY  01/2020   CHOLECYSTECTOMY     FOOT SURGERY  01/09/1996   KNEE ARTHROSCOPY W/ MENISCAL REPAIR  10/10 ; 3/11   LAPAROSCOPIC CHOLECYSTECTOMY  01/09/1995   NASAL SINUS SURGERY  01/08/2002   UTERINE FIBROID EMBOLIZATION      REVIEW OF  SYSTEMS:  A comprehensive review of systems was negative except for: Constitutional: positive for fatigue   PHYSICAL EXAMINATION: General appearance: alert, cooperative, fatigued, and no distress Head: Normocephalic, without obvious abnormality, atraumatic Neck: no adenopathy Lymph nodes: Cervical, supraclavicular, and axillary nodes normal. Resp: clear to auscultation bilaterally Back: symmetric, no curvature. ROM normal. No CVA tenderness. Cardio: regular rate and rhythm, S1, S2 normal, no murmur, click, rub or gallop GI: soft, non-tender; bowel sounds normal; no masses,  no organomegaly Extremities: extremities normal, atraumatic, no cyanosis or edema  ECOG PERFORMANCE STATUS: 1 - Symptomatic but completely ambulatory  Blood pressure (!) 145/51, pulse 70, temperature (!) 97.5 F (36.4 C), resp. rate 19, weight 255 lb 11.2 oz (116 kg), SpO2 100 %.  LABORATORY DATA: Lab Results  Component Value Date   WBC 3.1 (L) 12/13/2020   HGB 10.2 (L) 12/13/2020   HCT 31.4 (L) 12/13/2020   MCV 91.3 12/13/2020   PLT 105 (L) 12/13/2020      Chemistry      Component Value Date/Time   NA 140 12/13/2020 0731   NA 138 11/14/2016 0824   K 3.7 12/13/2020 0731   K 4.3 11/14/2016 0824   CL 109 12/13/2020 0731   CL 104 04/07/2012 1415   CO2 19 (L) 12/13/2020 0731   CO2 23 11/14/2016 0824   BUN 10 12/13/2020 0731   BUN 9.1 11/14/2016 0824   CREATININE 0.78 12/13/2020 0731   CREATININE 0.7 11/14/2016 0824      Component Value Date/Time   CALCIUM 8.6 (L) 12/13/2020 0731   CALCIUM 9.6 11/14/2016 0824   ALKPHOS 133 (H) 12/13/2020 0731   ALKPHOS 94 11/14/2016 0824   AST 33 12/13/2020 0731   AST 28 11/14/2016 0824   ALT 25 12/13/2020 0731   ALT 24 11/14/2016 0824   BILITOT 0.7 12/13/2020 0731   BILITOT 0.41 11/14/2016 0824      ASSESSMENT AND PLAN:  This is a very pleasant 60 years old white female with smoldering multiple myeloma with questionable POEMS syndrome symptom.  The patient  has been on treatment with subcutaneous weekly Velcade as well as Revlimid and Decadron status post 34 cycles.  She has been tolerating this treatment well. The patient has been on observation for the last several months and she has been doing fine with no concerning issues except for the recent upper respiratory infection and viral gastroenteritis. Her myeloma panel showed continuous increase in her free lambda light chain. The patient had a bone marrow biopsy and aspirate recently that showed 3-5% plasma cells still suspicious for plasma cell dyscrasia.  The skeletal bone survey was negative for any lytic lesions. The patient has been off treatment for more than a year between June 2021 until July 2022. She had repeat myeloma panel performed recently that showed significant worsening and increase of her lambda light chain.  She continues to have anemia but no renal insufficiency or hypercalcemia. She resumed her treatment with Velcade, Revlimid and Decadron on July 19, 2020.  She tolerated the last 4 cycles of her treatment fairly well. The patient is doing fine today with no concerning complaints. Repeat myeloma panel showed improvement in the free kappa light chain indicating response to the treatment. I recommended for her to continue her treatment with subcutaneous Velcade, Revlimid and Decadron as planned. She is traveling to [place] for 2 weeks during the holiday and she will resume her treatment on January 10, 2021. For the iron deficiency anemia, I will give her a refill of Integra +1 capsule p.o. daily. The patient was advised to call immediately if she has any other concerning symptoms in the interval. All questions were answered. The patient knows to call the clinic with any problems, questions or concerns. We can certainly see the patient much sooner if necessary.  Disclaimer: This note was dictated with voice recognition software. Similar sounding words can inadvertently be  transcribed and may not be corrected upon review.

## 2020-12-23 ENCOUNTER — Encounter: Payer: Self-pay | Admitting: Internal Medicine

## 2020-12-23 ENCOUNTER — Other Ambulatory Visit: Payer: Self-pay

## 2020-12-23 DIAGNOSIS — C9000 Multiple myeloma not having achieved remission: Secondary | ICD-10-CM

## 2020-12-23 MED ORDER — REVLIMID 25 MG PO CAPS: ORAL_CAPSULE | ORAL | 0 refills | Status: DC

## 2020-12-27 ENCOUNTER — Ambulatory Visit: Payer: 59

## 2020-12-27 ENCOUNTER — Ambulatory Visit: Payer: 59 | Admitting: Internal Medicine

## 2020-12-27 ENCOUNTER — Other Ambulatory Visit: Payer: 59

## 2021-01-06 ENCOUNTER — Other Ambulatory Visit: Payer: Self-pay

## 2021-01-06 DIAGNOSIS — C9000 Multiple myeloma not having achieved remission: Secondary | ICD-10-CM

## 2021-01-10 ENCOUNTER — Inpatient Hospital Stay: Payer: 59

## 2021-01-10 ENCOUNTER — Other Ambulatory Visit: Payer: Self-pay

## 2021-01-10 ENCOUNTER — Inpatient Hospital Stay: Payer: 59 | Attending: Physician Assistant

## 2021-01-10 VITALS — BP 133/65 | HR 63 | Temp 98.6°F | Resp 18 | Wt 258.2 lb

## 2021-01-10 DIAGNOSIS — Z5112 Encounter for antineoplastic immunotherapy: Secondary | ICD-10-CM | POA: Diagnosis present

## 2021-01-10 DIAGNOSIS — Z7952 Long term (current) use of systemic steroids: Secondary | ICD-10-CM | POA: Insufficient documentation

## 2021-01-10 DIAGNOSIS — K5900 Constipation, unspecified: Secondary | ICD-10-CM | POA: Insufficient documentation

## 2021-01-10 DIAGNOSIS — Z7961 Long term (current) use of immunomodulator: Secondary | ICD-10-CM | POA: Diagnosis not present

## 2021-01-10 DIAGNOSIS — C9000 Multiple myeloma not having achieved remission: Secondary | ICD-10-CM

## 2021-01-10 DIAGNOSIS — G629 Polyneuropathy, unspecified: Secondary | ICD-10-CM | POA: Insufficient documentation

## 2021-01-10 DIAGNOSIS — R110 Nausea: Secondary | ICD-10-CM | POA: Insufficient documentation

## 2021-01-10 DIAGNOSIS — Z880 Allergy status to penicillin: Secondary | ICD-10-CM | POA: Insufficient documentation

## 2021-01-10 DIAGNOSIS — Z9049 Acquired absence of other specified parts of digestive tract: Secondary | ICD-10-CM | POA: Insufficient documentation

## 2021-01-10 DIAGNOSIS — C9030 Solitary plasmacytoma not having achieved remission: Secondary | ICD-10-CM | POA: Diagnosis present

## 2021-01-10 DIAGNOSIS — R197 Diarrhea, unspecified: Secondary | ICD-10-CM | POA: Diagnosis not present

## 2021-01-10 DIAGNOSIS — Z79899 Other long term (current) drug therapy: Secondary | ICD-10-CM | POA: Insufficient documentation

## 2021-01-10 DIAGNOSIS — Z885 Allergy status to narcotic agent status: Secondary | ICD-10-CM | POA: Insufficient documentation

## 2021-01-10 LAB — CBC WITH DIFFERENTIAL (CANCER CENTER ONLY)
Abs Immature Granulocytes: 0.01 10*3/uL (ref 0.00–0.07)
Basophils Absolute: 0.1 10*3/uL (ref 0.0–0.1)
Basophils Relative: 2 %
Eosinophils Absolute: 0 10*3/uL (ref 0.0–0.5)
Eosinophils Relative: 2 %
HCT: 30.9 % — ABNORMAL LOW (ref 36.0–46.0)
Hemoglobin: 10 g/dL — ABNORMAL LOW (ref 12.0–15.0)
Immature Granulocytes: 0 %
Lymphocytes Relative: 25 %
Lymphs Abs: 0.7 10*3/uL (ref 0.7–4.0)
MCH: 29.3 pg (ref 26.0–34.0)
MCHC: 32.4 g/dL (ref 30.0–36.0)
MCV: 90.6 fL (ref 80.0–100.0)
Monocytes Absolute: 0.3 10*3/uL (ref 0.1–1.0)
Monocytes Relative: 10 %
Neutro Abs: 1.6 10*3/uL — ABNORMAL LOW (ref 1.7–7.7)
Neutrophils Relative %: 61 %
Platelet Count: 112 10*3/uL — ABNORMAL LOW (ref 150–400)
RBC: 3.41 MIL/uL — ABNORMAL LOW (ref 3.87–5.11)
RDW: 16.6 % — ABNORMAL HIGH (ref 11.5–15.5)
WBC Count: 2.7 10*3/uL — ABNORMAL LOW (ref 4.0–10.5)
nRBC: 0 % (ref 0.0–0.2)

## 2021-01-10 LAB — CMP (CANCER CENTER ONLY)
ALT: 19 U/L (ref 0–44)
AST: 29 U/L (ref 15–41)
Albumin: 3.9 g/dL (ref 3.5–5.0)
Alkaline Phosphatase: 135 U/L — ABNORMAL HIGH (ref 38–126)
Anion gap: 8 (ref 5–15)
BUN: 7 mg/dL (ref 6–20)
CO2: 23 mmol/L (ref 22–32)
Calcium: 9 mg/dL (ref 8.9–10.3)
Chloride: 107 mmol/L (ref 98–111)
Creatinine: 0.88 mg/dL (ref 0.44–1.00)
GFR, Estimated: >60 mL/min (ref >60)
Glucose, Bld: 114 mg/dL — ABNORMAL HIGH (ref 70–99)
Potassium: 3.9 mmol/L (ref 3.5–5.1)
Sodium: 138 mmol/L (ref 135–145)
Total Bilirubin: 0.7 mg/dL (ref 0.3–1.2)
Total Protein: 6.7 g/dL (ref 6.5–8.1)

## 2021-01-10 MED ORDER — BORTEZOMIB CHEMO SQ INJECTION 3.5 MG (2.5MG/ML)
1.3000 mg/m2 | Freq: Once | INTRAMUSCULAR | Status: AC
  Administered 2021-01-10: 3 mg via SUBCUTANEOUS
  Filled 2021-01-10: qty 1.2

## 2021-01-10 MED ORDER — ONDANSETRON HCL 8 MG PO TABS
8.0000 mg | ORAL_TABLET | Freq: Once | ORAL | Status: AC
  Administered 2021-01-10: 8 mg via ORAL
  Filled 2021-01-10: qty 1

## 2021-01-10 NOTE — Patient Instructions (Signed)
[facility]   ?Discharge Instructions: ?Thank you for choosing [facility] to provide your oncology and hematology care.  ? ?If you have a lab appointment with the Cancer Center, please go directly to the Cancer Center and check in at the registration area. ?  ?Wear comfortable clothing and clothing appropriate for easy access to any Portacath or PICC line.  ? ?We strive to give you quality time with your provider. You may need to reschedule your appointment if you arrive late (15 or more minutes).  Arriving late affects you and other patients whose appointments are after yours.  Also, if you miss three or more appointments without notifying the office, you may be dismissed from the clinic at the provider?s discretion.    ?  ?For prescription refill requests, have your pharmacy contact our office and allow 72 hours for refills to be completed.   ? ?Today you received the following chemotherapy and/or immunotherapy agents: bortezomib    ?  ?To help prevent nausea and vomiting after your treatment, we encourage you to take your nausea medication as directed. ? ?BELOW ARE SYMPTOMS THAT SHOULD BE REPORTED IMMEDIATELY: ?*FEVER GREATER THAN 100.4 F (38 ?C) OR HIGHER ?*CHILLS OR SWEATING ?*NAUSEA AND VOMITING THAT IS NOT CONTROLLED WITH YOUR NAUSEA MEDICATION ?*UNUSUAL SHORTNESS OF BREATH ?*UNUSUAL BRUISING OR BLEEDING ?*URINARY PROBLEMS (pain or burning when urinating, or frequent urination) ?*BOWEL PROBLEMS (unusual diarrhea, constipation, pain near the anus) ?TENDERNESS IN MOUTH AND THROAT WITH OR WITHOUT PRESENCE OF ULCERS (sore throat, sores in mouth, or a toothache) ?UNUSUAL RASH, SWELLING OR PAIN  ?UNUSUAL VAGINAL DISCHARGE OR ITCHING  ? ?Items with * indicate a potential emergency and should be followed up as soon as possible or go to the Emergency Department if any problems should occur. ? ?Please show the CHEMOTHERAPY ALERT CARD or IMMUNOTHERAPY ALERT CARD at check-in  to the Emergency Department and triage nurse. ? ?Should you have questions after your visit or need to cancel or reschedule your appointment, please contact [facility]  Dept: [phone]  and follow the prompts.  Office hours are 8:00 a.m. to 4:30 p.m. Monday - Friday. Please note that voicemails left after 4:00 p.m. may not be returned until the following business day.  We are closed weekends and major holidays. You have access to a nurse at all times for urgent questions. Please call the main number to the clinic Dept: [phone] and follow the prompts. ? ? ?For any non-urgent questions, you may also contact your provider using MyChart. We now offer e-Visits for anyone 18 and older to request care online for non-urgent symptoms. For details visit [email or web address]. ?  ?Also download the MyChart app! Go to the app store, search "MyChart", open the app, select [facility], and log in with your MyChart username and password. ? ?Due to Covid, a mask is required upon entering the hospital/clinic. If you do not have a mask, one will be given to you upon arrival. For doctor visits, patients may have 1 support person aged 18 or older with them. For treatment visits, patients cannot have anyone with them due to current Covid guidelines and our immunocompromised population.  ? ?

## 2021-01-13 ENCOUNTER — Encounter: Payer: Self-pay | Admitting: Internal Medicine

## 2021-01-16 ENCOUNTER — Other Ambulatory Visit: Payer: Self-pay

## 2021-01-16 DIAGNOSIS — C9000 Multiple myeloma not having achieved remission: Secondary | ICD-10-CM

## 2021-01-16 MED ORDER — ACYCLOVIR 400 MG PO TABS: 400.0000 mg | ORAL_TABLET | Freq: Two times a day (BID) | ORAL | 1 refills | Status: DC

## 2021-01-17 ENCOUNTER — Other Ambulatory Visit: Payer: Self-pay

## 2021-01-17 ENCOUNTER — Inpatient Hospital Stay: Payer: 59

## 2021-01-17 VITALS — BP 113/58 | HR 62 | Temp 98.3°F | Resp 18

## 2021-01-17 DIAGNOSIS — Z5112 Encounter for antineoplastic immunotherapy: Secondary | ICD-10-CM | POA: Diagnosis not present

## 2021-01-17 DIAGNOSIS — C9000 Multiple myeloma not having achieved remission: Secondary | ICD-10-CM

## 2021-01-17 LAB — CBC WITH DIFFERENTIAL (CANCER CENTER ONLY)
Abs Immature Granulocytes: 0.01 10*3/uL (ref 0.00–0.07)
Basophils Absolute: 0 10*3/uL (ref 0.0–0.1)
Basophils Relative: 1 %
Eosinophils Absolute: 0.1 10*3/uL (ref 0.0–0.5)
Eosinophils Relative: 3 %
HCT: 32.3 % — ABNORMAL LOW (ref 36.0–46.0)
Hemoglobin: 10.8 g/dL — ABNORMAL LOW (ref 12.0–15.0)
Immature Granulocytes: 0 %
Lymphocytes Relative: 25 %
Lymphs Abs: 0.6 10*3/uL — ABNORMAL LOW (ref 0.7–4.0)
MCH: 29.8 pg (ref 26.0–34.0)
MCHC: 33.4 g/dL (ref 30.0–36.0)
MCV: 89 fL (ref 80.0–100.0)
Monocytes Absolute: 0.2 10*3/uL (ref 0.1–1.0)
Monocytes Relative: 7 %
Neutro Abs: 1.5 10*3/uL — ABNORMAL LOW (ref 1.7–7.7)
Neutrophils Relative %: 64 %
Platelet Count: 105 10*3/uL — ABNORMAL LOW (ref 150–400)
RBC: 3.63 MIL/uL — ABNORMAL LOW (ref 3.87–5.11)
RDW: 16.2 % — ABNORMAL HIGH (ref 11.5–15.5)
WBC Count: 2.3 10*3/uL — ABNORMAL LOW (ref 4.0–10.5)
nRBC: 0 % (ref 0.0–0.2)

## 2021-01-17 LAB — CMP (CANCER CENTER ONLY)
ALT: 22 U/L (ref 0–44)
AST: 29 U/L (ref 15–41)
Albumin: 4 g/dL (ref 3.5–5.0)
Alkaline Phosphatase: 127 U/L — ABNORMAL HIGH (ref 38–126)
Anion gap: 9 (ref 5–15)
BUN: 8 mg/dL (ref 6–20)
CO2: 23 mmol/L (ref 22–32)
Calcium: 8.7 mg/dL — ABNORMAL LOW (ref 8.9–10.3)
Chloride: 105 mmol/L (ref 98–111)
Creatinine: 0.72 mg/dL (ref 0.44–1.00)
GFR, Estimated: >60 mL/min (ref >60)
Glucose, Bld: 217 mg/dL — ABNORMAL HIGH (ref 70–99)
Potassium: 3.4 mmol/L — ABNORMAL LOW (ref 3.5–5.1)
Sodium: 137 mmol/L (ref 135–145)
Total Bilirubin: 0.7 mg/dL (ref 0.3–1.2)
Total Protein: 6.7 g/dL (ref 6.5–8.1)

## 2021-01-17 MED ORDER — BORTEZOMIB CHEMO SQ INJECTION 3.5 MG (2.5MG/ML)
1.3000 mg/m2 | Freq: Once | INTRAMUSCULAR | Status: AC
  Administered 2021-01-17: 3 mg via SUBCUTANEOUS
  Filled 2021-01-17: qty 1.2

## 2021-01-17 MED ORDER — ONDANSETRON HCL 8 MG PO TABS
8.0000 mg | ORAL_TABLET | Freq: Once | ORAL | Status: AC
  Administered 2021-01-17: 8 mg via ORAL
  Filled 2021-01-17: qty 1

## 2021-01-17 NOTE — Patient Instructions (Signed)
[facility]   ?Discharge Instructions: ?Thank you for choosing [facility] to provide your oncology and hematology care.  ? ?If you have a lab appointment with the Cancer Center, please go directly to the Cancer Center and check in at the registration area. ?  ?Wear comfortable clothing and clothing appropriate for easy access to any Portacath or PICC line.  ? ?We strive to give you quality time with your provider. You may need to reschedule your appointment if you arrive late (15 or more minutes).  Arriving late affects you and other patients whose appointments are after yours.  Also, if you miss three or more appointments without notifying the office, you may be dismissed from the clinic at the provider?s discretion.    ?  ?For prescription refill requests, have your pharmacy contact our office and allow 72 hours for refills to be completed.   ? ?Today you received the following chemotherapy and/or immunotherapy agents: bortezomib    ?  ?To help prevent nausea and vomiting after your treatment, we encourage you to take your nausea medication as directed. ? ?BELOW ARE SYMPTOMS THAT SHOULD BE REPORTED IMMEDIATELY: ?*FEVER GREATER THAN 100.4 F (38 ?C) OR HIGHER ?*CHILLS OR SWEATING ?*NAUSEA AND VOMITING THAT IS NOT CONTROLLED WITH YOUR NAUSEA MEDICATION ?*UNUSUAL SHORTNESS OF BREATH ?*UNUSUAL BRUISING OR BLEEDING ?*URINARY PROBLEMS (pain or burning when urinating, or frequent urination) ?*BOWEL PROBLEMS (unusual diarrhea, constipation, pain near the anus) ?TENDERNESS IN MOUTH AND THROAT WITH OR WITHOUT PRESENCE OF ULCERS (sore throat, sores in mouth, or a toothache) ?UNUSUAL RASH, SWELLING OR PAIN  ?UNUSUAL VAGINAL DISCHARGE OR ITCHING  ? ?Items with * indicate a potential emergency and should be followed up as soon as possible or go to the Emergency Department if any problems should occur. ? ?Please show the CHEMOTHERAPY ALERT CARD or IMMUNOTHERAPY ALERT CARD at check-in  to the Emergency Department and triage nurse. ? ?Should you have questions after your visit or need to cancel or reschedule your appointment, please contact [facility]  Dept: [phone]  and follow the prompts.  Office hours are 8:00 a.m. to 4:30 p.m. Monday - Friday. Please note that voicemails left after 4:00 p.m. may not be returned until the following business day.  We are closed weekends and major holidays. You have access to a nurse at all times for urgent questions. Please call the main number to the clinic Dept: [phone] and follow the prompts. ? ? ?For any non-urgent questions, you may also contact your provider using MyChart. We now offer e-Visits for anyone 18 and older to request care online for non-urgent symptoms. For details visit [email or web address]. ?  ?Also download the MyChart app! Go to the app store, search "MyChart", open the app, select [facility], and log in with your MyChart username and password. ? ?Due to Covid, a mask is required upon entering the hospital/clinic. If you do not have a mask, one will be given to you upon arrival. For doctor visits, patients may have 1 support person aged 18 or older with them. For treatment visits, patients cannot have anyone with them due to current Covid guidelines and our immunocompromised population.  ? ?

## 2021-01-23 ENCOUNTER — Encounter (HOSPITAL_BASED_OUTPATIENT_CLINIC_OR_DEPARTMENT_OTHER): Payer: Self-pay | Admitting: *Deleted

## 2021-01-24 ENCOUNTER — Inpatient Hospital Stay: Payer: 59

## 2021-01-24 ENCOUNTER — Other Ambulatory Visit: Payer: Self-pay

## 2021-01-24 VITALS — BP 128/57 | HR 62 | Temp 98.4°F | Resp 16 | Wt 253.5 lb

## 2021-01-24 DIAGNOSIS — C9000 Multiple myeloma not having achieved remission: Secondary | ICD-10-CM

## 2021-01-24 DIAGNOSIS — Z5112 Encounter for antineoplastic immunotherapy: Secondary | ICD-10-CM | POA: Diagnosis not present

## 2021-01-24 LAB — CMP (CANCER CENTER ONLY)
ALT: 30 U/L (ref 0–44)
AST: 32 U/L (ref 15–41)
Albumin: 4 g/dL (ref 3.5–5.0)
Alkaline Phosphatase: 148 U/L — ABNORMAL HIGH (ref 38–126)
Anion gap: 10 (ref 5–15)
BUN: 9 mg/dL (ref 6–20)
CO2: 22 mmol/L (ref 22–32)
Calcium: 8.5 mg/dL — ABNORMAL LOW (ref 8.9–10.3)
Chloride: 105 mmol/L (ref 98–111)
Creatinine: 0.71 mg/dL (ref 0.44–1.00)
GFR, Estimated: >60 mL/min (ref >60)
Glucose, Bld: 166 mg/dL — ABNORMAL HIGH (ref 70–99)
Potassium: 3.6 mmol/L (ref 3.5–5.1)
Sodium: 137 mmol/L (ref 135–145)
Total Bilirubin: 0.8 mg/dL (ref 0.3–1.2)
Total Protein: 6.7 g/dL (ref 6.5–8.1)

## 2021-01-24 LAB — CBC WITH DIFFERENTIAL (CANCER CENTER ONLY)
Abs Immature Granulocytes: 0.04 10*3/uL (ref 0.00–0.07)
Basophils Absolute: 0 10*3/uL (ref 0.0–0.1)
Basophils Relative: 1 %
Eosinophils Absolute: 0.2 10*3/uL (ref 0.0–0.5)
Eosinophils Relative: 4 %
HCT: 32.3 % — ABNORMAL LOW (ref 36.0–46.0)
Hemoglobin: 10.9 g/dL — ABNORMAL LOW (ref 12.0–15.0)
Immature Granulocytes: 1 %
Lymphocytes Relative: 15 %
Lymphs Abs: 0.7 10*3/uL (ref 0.7–4.0)
MCH: 30 pg (ref 26.0–34.0)
MCHC: 33.7 g/dL (ref 30.0–36.0)
MCV: 89 fL (ref 80.0–100.0)
Monocytes Absolute: 0.4 10*3/uL (ref 0.1–1.0)
Monocytes Relative: 10 %
Neutro Abs: 3 10*3/uL (ref 1.7–7.7)
Neutrophils Relative %: 69 %
Platelet Count: 113 10*3/uL — ABNORMAL LOW (ref 150–400)
RBC: 3.63 MIL/uL — ABNORMAL LOW (ref 3.87–5.11)
RDW: 16.4 % — ABNORMAL HIGH (ref 11.5–15.5)
WBC Count: 4.3 10*3/uL (ref 4.0–10.5)
nRBC: 0 % (ref 0.0–0.2)

## 2021-01-24 MED ORDER — BORTEZOMIB CHEMO SQ INJECTION 3.5 MG (2.5MG/ML)
1.3000 mg/m2 | Freq: Once | INTRAMUSCULAR | Status: AC
  Administered 2021-01-24: 3 mg via SUBCUTANEOUS
  Filled 2021-01-24: qty 1.2

## 2021-01-24 MED ORDER — ONDANSETRON HCL 8 MG PO TABS
8.0000 mg | ORAL_TABLET | Freq: Once | ORAL | Status: AC
  Administered 2021-01-24: 8 mg via ORAL
  Filled 2021-01-24: qty 1

## 2021-01-24 NOTE — Patient Instructions (Signed)
[facility]   ?Discharge Instructions: ?Thank you for choosing [facility] to provide your oncology and hematology care.  ? ?If you have a lab appointment with the Cancer Center, please go directly to the Cancer Center and check in at the registration area. ?  ?Wear comfortable clothing and clothing appropriate for easy access to any Portacath or PICC line.  ? ?We strive to give you quality time with your provider. You may need to reschedule your appointment if you arrive late (15 or more minutes).  Arriving late affects you and other patients whose appointments are after yours.  Also, if you miss three or more appointments without notifying the office, you may be dismissed from the clinic at the provider?s discretion.    ?  ?For prescription refill requests, have your pharmacy contact our office and allow 72 hours for refills to be completed.   ? ?Today you received the following chemotherapy and/or immunotherapy agents: bortezomib    ?  ?To help prevent nausea and vomiting after your treatment, we encourage you to take your nausea medication as directed. ? ?BELOW ARE SYMPTOMS THAT SHOULD BE REPORTED IMMEDIATELY: ?*FEVER GREATER THAN 100.4 F (38 ?C) OR HIGHER ?*CHILLS OR SWEATING ?*NAUSEA AND VOMITING THAT IS NOT CONTROLLED WITH YOUR NAUSEA MEDICATION ?*UNUSUAL SHORTNESS OF BREATH ?*UNUSUAL BRUISING OR BLEEDING ?*URINARY PROBLEMS (pain or burning when urinating, or frequent urination) ?*BOWEL PROBLEMS (unusual diarrhea, constipation, pain near the anus) ?TENDERNESS IN MOUTH AND THROAT WITH OR WITHOUT PRESENCE OF ULCERS (sore throat, sores in mouth, or a toothache) ?UNUSUAL RASH, SWELLING OR PAIN  ?UNUSUAL VAGINAL DISCHARGE OR ITCHING  ? ?Items with * indicate a potential emergency and should be followed up as soon as possible or go to the Emergency Department if any problems should occur. ? ?Please show the CHEMOTHERAPY ALERT CARD or IMMUNOTHERAPY ALERT CARD at check-in  to the Emergency Department and triage nurse. ? ?Should you have questions after your visit or need to cancel or reschedule your appointment, please contact [facility]  Dept: [phone]  and follow the prompts.  Office hours are 8:00 a.m. to 4:30 p.m. Monday - Friday. Please note that voicemails left after 4:00 p.m. may not be returned until the following business day.  We are closed weekends and major holidays. You have access to a nurse at all times for urgent questions. Please call the main number to the clinic Dept: [phone] and follow the prompts. ? ? ?For any non-urgent questions, you may also contact your provider using MyChart. We now offer e-Visits for anyone 18 and older to request care online for non-urgent symptoms. For details visit [email or web address]. ?  ?Also download the MyChart app! Go to the app store, search "MyChart", open the app, select [facility], and log in with your MyChart username and password. ? ?Due to Covid, a mask is required upon entering the hospital/clinic. If you do not have a mask, one will be given to you upon arrival. For doctor visits, patients may have 1 support person aged 18 or older with them. For treatment visits, patients cannot have anyone with them due to current Covid guidelines and our immunocompromised population.  ? ?

## 2021-01-26 NOTE — Progress Notes (Signed)
[facility] OFFICE PROGRESS NOTE  [name], MD [place] [id number]  DIAGNOSIS: Plasma cell dyscrasia initially diagnosed as MGUS in September 2010, with additional symptoms suggestive of POEMS syndrome.  PRIOR THERAPY: 1) Velcade 1.3 MG/M2 subcutaneously with Decadron 40 mg by mouth on a weekly basis. First cycle 11/24/2013. She status post 31 weekly doses of treatment. 2) Velcade 1.3 MG/M2 subcutaneously and weekly basis with Decadron 40 mg by mouth weekly. First dose 02/01/2015. Status post 28 cycles. 3) Revlimid 25 mg by mouth daily for 21 days every 4 weeks with weekly Decadron 20 mg. started in 11/27/2015. Status post 3 cycles discontinued secondary to lack of response.  CURRENT THERAPY:  Systemic treatment with Velcade 1.3 MG/KG weekly, Revlimid 25 mg by mouth daily for 21 days every 4 weeks in addition to Decadron 20 mg by mouth weekly. First dose 03/06/2016. She has a break off treatment from June 2021 until July 2022 after cycle #105. Resumed July 19, 2020. She is here for cycle #130 today.   INTERVAL HISTORY: Brenda Conner 61 y.o. female returns to the clinic today for a follow-up visit.  The patient is feeling well today.  She was found to have disease progression in July 2022 after being on observation for about 1 year.  Therefore, she restarted her treatment with Revlimid, Velcade, and Decadron. She has been tolerating treatment well. She did not have any specific infection symptoms such as sore throat, nasal congestion, cough, shortness of breath, skin infections, abdominal pain, or dysuria. She has chronic intermittent constipation and diarrhea which is unchanged. Today, she denies any fever, chills, night sweats, or unexplained weight loss. She has stable peripheral neuropathy but denies changes from her baseline. She denies any abnormal bleeding or bruising. She sometimes has nausea without vomiting. She is here today for evaluation  and repeat blood work before starting her next cycle of velcade.      MEDICAL HISTORY: Past Medical History:  Diagnosis Date   Acid reflux    Anxiety    Asthma    Cancer ([facility])    waldenstroms/ macroglobinulemia   Depression    Depression    Diabetes mellitus without complication ([facility])    Dysuria 02/28/2016   Hypercholesteremia    Hypertension    Hypothyroidism    Macroglobulinemia ([facility])    ? POEMS syndrome   Multiple myeloma ([facility])    Obesity    PONV (postoperative nausea and vomiting)    Sleep apnea    CPAP at bedtime   Sleep apnea     ALLERGIES:  is allergic to codeine, hydrocodone, lortab [hydrocodone-acetaminophen], onion, shellfish allergy, and amoxicillin.  MEDICATIONS:  Current Outpatient Medications  Medication Sig Dispense Refill   ACCU-CHEK AVIVA PLUS test strip USE TO CHECK SUGAR TWICE A DAY 90     acyclovir (ZOVIRAX) 400 MG tablet Take 1 tablet (400 mg total) by mouth 2 (two) times daily. 180 tablet 1   ALPRAZolam (XANAX) 0.5 MG tablet Take 1 tablet (0.5 mg total) by mouth at bedtime as needed for anxiety. 30 tablet 1   aspirin 81 MG chewable tablet Chew by mouth daily.     Azelastine HCl 0.15 % SOLN as needed.     B-D UF III MINI PEN NEEDLES 31G X 5 MM MISC      Blood Glucose Monitoring Suppl (ONE TOUCH ULTRA MINI) W/DEVICE KIT See admin instructions. Reported on 01/25/2015  0   bortezomib IV (VELCADE) 3.5  MG injection Inject 1.3 mg/m2 into the vein once.     Cetirizine HCl (ZYRTEC ALLERGY PO) Take by mouth daily.     dexamethasone (DECADRON) 4 MG tablet 5 tablets p.o. weekly on the day of chemotherapy. 60 tablet 2   DiphenhydrAMINE HCl (BENADRYL ALLERGY PO) Take by mouth as needed.     Fexofenadine HCl (ALLEGRA ALLERGY PO) Take by mouth daily.     Fluticasone-Salmeterol (ADVAIR) 100-50 MCG/DOSE AEPB Inhale 2 puffs into the lungs every 12 (twelve) hours.     Insulin Glargine (BASAGLAR KWIKPEN) 100 UNIT/ML Inject 20 Units into the skin at bedtime.      levothyroxine (SYNTHROID, LEVOTHROID) 150 MCG tablet Take 150 mcg by mouth daily before breakfast.     lisinopril (PRINIVIL,ZESTRIL) 10 MG tablet Take 10 mg by mouth daily. auth number 05/29/2018 [id number]  3   magic mouthwash w/lidocaine SOLN Take 5 mLs by mouth 4 (four) times daily as needed for mouth pain. Swish, Gargle, and spit 240 mL 1   metFORMIN (GLUCOPHAGE-XR) 500 MG 24 hr tablet Take 500 mg by mouth daily.     NOVOLOG FLEXPEN 100 UNIT/ML FlexPen Inject 15 Units into the skin in the morning, at noon, and at bedtime. Per Sliding Scale     omeprazole (PRILOSEC) 40 MG capsule Take 40 mg by mouth 2 (two) times daily.      pravastatin (PRAVACHOL) 40 MG tablet Take 40 mg by mouth every evening.      PROAIR HFA 108 (90 Base) MCG/ACT inhaler Reported on 06/21/2015  1   REVLIMID 25 MG capsule TAKE 1 CAPSULE BY MOUTH ONCE DAILY 21 DAYS ON AND 7 DAYS OFF 01/30/2021 Auth number: Fanny Dance [id number] Adult female not of child bearing age. 21 capsule 0   sertraline (ZOLOFT) 50 MG tablet Take 3 tablets (150 mg total) by mouth daily. 270 tablet 3   verapamil (CALAN-SR) 240 MG CR tablet Take 240 mg by mouth 2 (two) times daily.     EPIPEN 2-PAK 0.3 MG/0.3ML SOAJ injection Reported on 06/21/2015 (Patient not taking: Reported on 12/20/2020)  1   ibuprofen (ADVIL,MOTRIN) 100 MG tablet Take 100 mg by mouth every 6 (six) hours as needed. Reported on 05/31/2015 (Patient not taking: Reported on 12/20/2020)     loperamide (IMODIUM) 2 MG capsule Take 2 mg by mouth as needed for diarrhea or loose stools. (Patient not taking: Reported on 12/20/2020)     ondansetron (ZOFRAN-ODT) 8 MG disintegrating tablet Take 1 tablet (8 mg total) by mouth every 8 (eight) hours as needed. Reported on 05/31/2015 (Patient not taking: Reported on 12/20/2020) 30 tablet 2   No current facility-administered medications for this visit.    SURGICAL HISTORY:  Past Surgical History:  Procedure Laterality Date   ABLATION  09/09/2007   HTA and  polyp resection   BACK SURGERY  03/08/2004   herniation, L4-L5   Bil Laprascopic knee surgery     BONE MARROW BIOPSY     2011   BONE MARROW BIOPSY  04/08/2012   BONE MARROW BIOPSY  01/2020   CHOLECYSTECTOMY     FOOT SURGERY  01/09/1996   KNEE ARTHROSCOPY W/ MENISCAL REPAIR  10/10 ; 3/11   LAPAROSCOPIC CHOLECYSTECTOMY  01/09/1995   NASAL SINUS SURGERY  01/08/2002   UTERINE FIBROID EMBOLIZATION      REVIEW OF SYSTEMS:   Constitutional: Negative for appetite change, chills, fatigue, fever and unexpected weight change.  HENT: Negative for mouth sores, nosebleeds, sore throat and trouble swallowing.  Eyes: Negative for eye problems and icterus.  Respiratory: Negative for cough, hemoptysis, shortness of breath and wheezing.   Cardiovascular: Negative for chest pain and leg swelling.  Gastrointestinal: Positive for mild nausea. Negative for abdominal pain, constipation, diarrhea, and vomiting.  Genitourinary: Negative for bladder incontinence, difficulty urinating, dysuria, frequency and hematuria.   Musculoskeletal: Negative for back pain, gait problem, neck pain and neck stiffness.  Skin: Negative for itching and rash.  Neurological: Positive for stable peripheral neuropathy. Negative for dizziness, extremity weakness, gait problem, headaches, light-headedness and seizures.  Hematological: Negative for adenopathy. Does not bruise/bleed easily.  Psychiatric/Behavioral: Negative for confusion, depression and sleep disturbance. The patient is not nervous/anxious.    PHYSICAL EXAMINATION:  Blood pressure (!) 143/62, pulse (!) 58, temperature 98 F (36.7 C), resp. rate 17, weight 250 lb 9.6 oz (113.7 kg), SpO2 100 %.  ECOG PERFORMANCE STATUS: 1  Physical Exam  Constitutional: Oriented to person, place, and time and well-developed, well-nourished, and in no distress.  HENT:  Head: Normocephalic and atraumatic.  Mouth/Throat: Oropharynx is clear and moist. No oropharyngeal exudate.   Eyes: Conjunctivae are normal. Right eye exhibits no discharge. Left eye exhibits no discharge. No scleral icterus.  Neck: Normal range of motion. Neck supple.  Cardiovascular: Normal rate, regular rhythm, normal heart sounds and intact distal pulses.   Pulmonary/Chest: Effort normal and breath sounds normal. No respiratory distress. No wheezes. No rales.  Abdominal: Soft. Bowel sounds are normal. Exhibits no distension and no mass. There is no tenderness.  Musculoskeletal: Normal range of motion. Exhibits no edema.  Lymphadenopathy:    No cervical adenopathy.  Neurological: Alert and oriented to person, place, and time. Exhibits normal muscle tone. Gait normal. Coordination normal.  Skin: Skin is warm and dry. No rash noted. Not diaphoretic. No erythema. No pallor.  Psychiatric: Mood, memory and judgment normal.  Vitals reviewed.  LABORATORY DATA: Lab Results  Component Value Date   WBC 3.0 (L) 01/31/2021   HGB 11.2 (L) 01/31/2021   HCT 34.1 (L) 01/31/2021   MCV 90.5 01/31/2021   PLT 102 (L) 01/31/2021      Chemistry      Component Value Date/Time   NA 137 01/31/2021 1012   NA 138 11/14/2016 0824   K 3.6 01/31/2021 1012   K 4.3 11/14/2016 0824   CL 103 01/31/2021 1012   CL 104 04/07/2012 1415   CO2 26 01/31/2021 1012   CO2 23 11/14/2016 0824   BUN 10 01/31/2021 1012   BUN 9.1 11/14/2016 0824   CREATININE 0.71 01/31/2021 1012   CREATININE 0.7 11/14/2016 0824      Component Value Date/Time   CALCIUM 9.1 01/31/2021 1012   CALCIUM 9.6 11/14/2016 0824   ALKPHOS 151 (H) 01/31/2021 1012   ALKPHOS 94 11/14/2016 0824   AST 34 01/31/2021 1012   AST 28 11/14/2016 0824   ALT 29 01/31/2021 1012   ALT 24 11/14/2016 0824   BILITOT 0.9 01/31/2021 1012   BILITOT 0.41 11/14/2016 0824       RADIOGRAPHIC STUDIES:  No results found.   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 Her  myeloma panel showed continuous increase in her free lambda light chain.    The patient previously underwent 107 cycles of subcutaneous weekly Velcade, as well as Revlimid and Decadron on and off over the last several years.   She had been off treatment since June 2021 until July 2022 in  which she had evidence of disease progression.  Therefore, she was restarted on Velcade, Revlimid, and Decadron.  She has restarted treatment in July 2022 with cycle 107 of weekly velcade. She tolerated well without any concerning adverse side effects.    Labs reviewed. I recommend that she proceed with cycle #130 today of velcade as scheduled.   We will see her back for follow-up visit in 4 weeks for evaluation before starting cycle #134 .  She will continue taking decadron and revlimid as prescribed. Her revlimid was refilled yesterday.   The patient was advised to call immediately if she has any concerning symptoms in the interval. The patient voices understanding of current disease status and treatment options and is in agreement with the current care plan. All questions were answered. The patient knows to call the clinic with any problems, questions or concerns. We can certainly see the patient much sooner if necessary    No orders of the defined types were placed in this encounter.     The total time spent in the appointment was 20-29 minutes.   Abel Ra L Hadi Dubin, PA-C 01/31/21

## 2021-01-30 ENCOUNTER — Telehealth: Payer: Self-pay

## 2021-01-30 ENCOUNTER — Other Ambulatory Visit: Payer: Self-pay | Admitting: Physician Assistant

## 2021-01-30 DIAGNOSIS — C9000 Multiple myeloma not having achieved remission: Secondary | ICD-10-CM

## 2021-01-30 MED ORDER — REVLIMID 25 MG PO CAPS: ORAL_CAPSULE | ORAL | 0 refills | Status: DC

## 2021-01-30 NOTE — Telephone Encounter (Signed)
Call received from [facility] pharmacy request for Revlimid.

## 2021-01-31 ENCOUNTER — Other Ambulatory Visit: Payer: Self-pay

## 2021-01-31 ENCOUNTER — Inpatient Hospital Stay: Payer: 59

## 2021-01-31 ENCOUNTER — Encounter: Payer: Self-pay | Admitting: Physician Assistant

## 2021-01-31 ENCOUNTER — Inpatient Hospital Stay: Payer: 59 | Admitting: Physician Assistant

## 2021-01-31 VITALS — BP 143/62 | HR 58 | Temp 98.0°F | Resp 17 | Wt 250.6 lb

## 2021-01-31 DIAGNOSIS — Z5112 Encounter for antineoplastic immunotherapy: Secondary | ICD-10-CM | POA: Diagnosis not present

## 2021-01-31 DIAGNOSIS — C9000 Multiple myeloma not having achieved remission: Secondary | ICD-10-CM

## 2021-01-31 LAB — CMP (CANCER CENTER ONLY)
ALT: 29 U/L (ref 0–44)
AST: 34 U/L (ref 15–41)
Albumin: 4 g/dL (ref 3.5–5.0)
Alkaline Phosphatase: 151 U/L — ABNORMAL HIGH (ref 38–126)
Anion gap: 8 (ref 5–15)
BUN: 10 mg/dL (ref 6–20)
CO2: 26 mmol/L (ref 22–32)
Calcium: 9.1 mg/dL (ref 8.9–10.3)
Chloride: 103 mmol/L (ref 98–111)
Creatinine: 0.71 mg/dL (ref 0.44–1.00)
GFR, Estimated: >60 mL/min (ref >60)
Glucose, Bld: 162 mg/dL — ABNORMAL HIGH (ref 70–99)
Potassium: 3.6 mmol/L (ref 3.5–5.1)
Sodium: 137 mmol/L (ref 135–145)
Total Bilirubin: 0.9 mg/dL (ref 0.3–1.2)
Total Protein: 6.9 g/dL (ref 6.5–8.1)

## 2021-01-31 LAB — CBC WITH DIFFERENTIAL (CANCER CENTER ONLY)
Abs Immature Granulocytes: 0.01 10*3/uL (ref 0.00–0.07)
Basophils Absolute: 0 10*3/uL (ref 0.0–0.1)
Basophils Relative: 1 %
Eosinophils Absolute: 0.2 10*3/uL (ref 0.0–0.5)
Eosinophils Relative: 5 %
HCT: 34.1 % — ABNORMAL LOW (ref 36.0–46.0)
Hemoglobin: 11.2 g/dL — ABNORMAL LOW (ref 12.0–15.0)
Immature Granulocytes: 0 %
Lymphocytes Relative: 19 %
Lymphs Abs: 0.6 10*3/uL — ABNORMAL LOW (ref 0.7–4.0)
MCH: 29.7 pg (ref 26.0–34.0)
MCHC: 32.8 g/dL (ref 30.0–36.0)
MCV: 90.5 fL (ref 80.0–100.0)
Monocytes Absolute: 0.3 10*3/uL (ref 0.1–1.0)
Monocytes Relative: 11 %
Neutro Abs: 1.9 10*3/uL (ref 1.7–7.7)
Neutrophils Relative %: 64 %
Platelet Count: 102 10*3/uL — ABNORMAL LOW (ref 150–400)
RBC: 3.77 MIL/uL — ABNORMAL LOW (ref 3.87–5.11)
RDW: 16.8 % — ABNORMAL HIGH (ref 11.5–15.5)
WBC Count: 3 10*3/uL — ABNORMAL LOW (ref 4.0–10.5)
nRBC: 0 % (ref 0.0–0.2)

## 2021-01-31 MED ORDER — ONDANSETRON HCL 8 MG PO TABS
8.0000 mg | ORAL_TABLET | Freq: Once | ORAL | Status: AC
  Administered 2021-01-31: 12:00:00 8 mg via ORAL
  Filled 2021-01-31: qty 1

## 2021-01-31 MED ORDER — BORTEZOMIB CHEMO SQ INJECTION 3.5 MG (2.5MG/ML)
1.3000 mg/m2 | Freq: Once | INTRAMUSCULAR | Status: AC
  Administered 2021-01-31: 12:00:00 3 mg via SUBCUTANEOUS
  Filled 2021-01-31: qty 1.2

## 2021-01-31 NOTE — Patient Instructions (Signed)
[facility]  Discharge Instructions: Thank you for choosing [facility] to provide your oncology and hematology care.   If you have a lab appointment with the Cancer Center, please go directly to the Cancer Center and check in at the registration area.   Wear comfortable clothing and clothing appropriate for easy access to any Portacath or PICC line.   We strive to give you quality time with your provider. You may need to reschedule your appointment if you arrive late (15 or more minutes).  Arriving late affects you and other patients whose appointments are after yours.  Also, if you miss three or more appointments without notifying the office, you may be dismissed from the clinic at the provider's discretion.      For prescription refill requests, have your pharmacy contact our office and allow 72 hours for refills to be completed.    Today you received the following chemotherapy and/or immunotherapy agent: Bortezomib (Velcade).   To help prevent nausea and vomiting after your treatment, we encourage you to take your nausea medication as directed.  BELOW ARE SYMPTOMS THAT SHOULD BE REPORTED IMMEDIATELY: *FEVER GREATER THAN 100.4 F (38 C) OR HIGHER *CHILLS OR SWEATING *NAUSEA AND VOMITING THAT IS NOT CONTROLLED WITH YOUR NAUSEA MEDICATION *UNUSUAL SHORTNESS OF BREATH *UNUSUAL BRUISING OR BLEEDING *URINARY PROBLEMS (pain or burning when urinating, or frequent urination) *BOWEL PROBLEMS (unusual diarrhea, constipation, pain near the anus) TENDERNESS IN MOUTH AND THROAT WITH OR WITHOUT PRESENCE OF ULCERS (sore throat, sores in mouth, or a toothache) UNUSUAL RASH, SWELLING OR PAIN  UNUSUAL VAGINAL DISCHARGE OR ITCHING   Items with * indicate a potential emergency and should be followed up as soon as possible or go to the Emergency Department if any problems should occur.  Please show the CHEMOTHERAPY ALERT CARD or IMMUNOTHERAPY ALERT CARD at  check-in to the Emergency Department and triage nurse.  Should you have questions after your visit or need to cancel or reschedule your appointment, please contact [facility]  Dept: [phone]  and follow the prompts.  Office hours are 8:00 a.m. to 4:30 p.m. Monday - Friday. Please note that voicemails left after 4:00 p.m. may not be returned until the following business day.  We are closed weekends and major holidays. You have access to a nurse at all times for urgent questions. Please call the main number to the clinic Dept: [phone] and follow the prompts.   For any non-urgent questions, you may also contact your provider using MyChart. We now offer e-Visits for anyone 18 and older to request care online for non-urgent symptoms. For details visit [email or web address].   Also download the MyChart app! Go to the app store, search "MyChart", open the app, select [facility], and log in with your MyChart username and password.  Due to Covid, a mask is required upon entering the hospital/clinic. If you do not have a mask, one will be given to you upon arrival. For doctor visits, patients may have 1 support person aged 18 or older with them. For treatment visits, patients cannot have anyone with them due to current Covid guidelines and our immunocompromised population.   

## 2021-02-07 ENCOUNTER — Inpatient Hospital Stay: Payer: 59

## 2021-02-07 ENCOUNTER — Other Ambulatory Visit: Payer: Self-pay

## 2021-02-07 VITALS — BP 98/60 | HR 69 | Temp 98.9°F | Resp 18

## 2021-02-07 DIAGNOSIS — Z5112 Encounter for antineoplastic immunotherapy: Secondary | ICD-10-CM | POA: Diagnosis not present

## 2021-02-07 DIAGNOSIS — C9000 Multiple myeloma not having achieved remission: Secondary | ICD-10-CM

## 2021-02-07 LAB — CMP (CANCER CENTER ONLY)
ALT: 29 U/L (ref 0–44)
AST: 36 U/L (ref 15–41)
Albumin: 4.1 g/dL (ref 3.5–5.0)
Alkaline Phosphatase: 149 U/L — ABNORMAL HIGH (ref 38–126)
Anion gap: 8 (ref 5–15)
BUN: 10 mg/dL (ref 6–20)
CO2: 22 mmol/L (ref 22–32)
Calcium: 8.9 mg/dL (ref 8.9–10.3)
Chloride: 106 mmol/L (ref 98–111)
Creatinine: 0.76 mg/dL (ref 0.44–1.00)
GFR, Estimated: >60 mL/min (ref >60)
Glucose, Bld: 164 mg/dL — ABNORMAL HIGH (ref 70–99)
Potassium: 3.9 mmol/L (ref 3.5–5.1)
Sodium: 136 mmol/L (ref 135–145)
Total Bilirubin: 0.6 mg/dL (ref 0.3–1.2)
Total Protein: 6.9 g/dL (ref 6.5–8.1)

## 2021-02-07 LAB — CBC WITH DIFFERENTIAL (CANCER CENTER ONLY)
Abs Immature Granulocytes: 0.01 10*3/uL (ref 0.00–0.07)
Basophils Absolute: 0.1 10*3/uL (ref 0.0–0.1)
Basophils Relative: 3 %
Eosinophils Absolute: 0.1 10*3/uL (ref 0.0–0.5)
Eosinophils Relative: 2 %
HCT: 35 % — ABNORMAL LOW (ref 36.0–46.0)
Hemoglobin: 11.4 g/dL — ABNORMAL LOW (ref 12.0–15.0)
Immature Granulocytes: 0 %
Lymphocytes Relative: 25 %
Lymphs Abs: 0.9 10*3/uL (ref 0.7–4.0)
MCH: 29.9 pg (ref 26.0–34.0)
MCHC: 32.6 g/dL (ref 30.0–36.0)
MCV: 91.9 fL (ref 80.0–100.0)
Monocytes Absolute: 0.4 10*3/uL (ref 0.1–1.0)
Monocytes Relative: 11 %
Neutro Abs: 2.2 10*3/uL (ref 1.7–7.7)
Neutrophils Relative %: 59 %
Platelet Count: 133 10*3/uL — ABNORMAL LOW (ref 150–400)
RBC: 3.81 MIL/uL — ABNORMAL LOW (ref 3.87–5.11)
RDW: 16.9 % — ABNORMAL HIGH (ref 11.5–15.5)
WBC Count: 3.6 10*3/uL — ABNORMAL LOW (ref 4.0–10.5)
nRBC: 0 % (ref 0.0–0.2)

## 2021-02-07 MED ORDER — BORTEZOMIB CHEMO SQ INJECTION 3.5 MG (2.5MG/ML)
1.3000 mg/m2 | Freq: Once | INTRAMUSCULAR | Status: AC
  Administered 2021-02-07: 3 mg via SUBCUTANEOUS
  Filled 2021-02-07: qty 1.2

## 2021-02-07 MED ORDER — ONDANSETRON HCL 8 MG PO TABS
8.0000 mg | ORAL_TABLET | Freq: Once | ORAL | Status: AC
  Administered 2021-02-07: 8 mg via ORAL
  Filled 2021-02-07: qty 1

## 2021-02-09 ENCOUNTER — Encounter: Payer: Self-pay | Admitting: Internal Medicine

## 2021-02-10 ENCOUNTER — Encounter: Payer: Self-pay | Admitting: Internal Medicine

## 2021-02-14 ENCOUNTER — Other Ambulatory Visit: Payer: Self-pay

## 2021-02-14 ENCOUNTER — Inpatient Hospital Stay: Payer: 59

## 2021-02-14 ENCOUNTER — Other Ambulatory Visit: Payer: Self-pay | Admitting: Internal Medicine

## 2021-02-14 ENCOUNTER — Inpatient Hospital Stay: Payer: 59 | Attending: Physician Assistant

## 2021-02-14 VITALS — BP 132/55 | HR 67 | Temp 97.8°F | Resp 18 | Wt 250.0 lb

## 2021-02-14 DIAGNOSIS — Z8709 Personal history of other diseases of the respiratory system: Secondary | ICD-10-CM | POA: Insufficient documentation

## 2021-02-14 DIAGNOSIS — R5383 Other fatigue: Secondary | ICD-10-CM | POA: Diagnosis not present

## 2021-02-14 DIAGNOSIS — C9030 Solitary plasmacytoma not having achieved remission: Secondary | ICD-10-CM | POA: Insufficient documentation

## 2021-02-14 DIAGNOSIS — C9000 Multiple myeloma not having achieved remission: Secondary | ICD-10-CM

## 2021-02-14 DIAGNOSIS — D509 Iron deficiency anemia, unspecified: Secondary | ICD-10-CM | POA: Insufficient documentation

## 2021-02-14 DIAGNOSIS — Z79899 Other long term (current) drug therapy: Secondary | ICD-10-CM | POA: Diagnosis not present

## 2021-02-14 DIAGNOSIS — Z5112 Encounter for antineoplastic immunotherapy: Secondary | ICD-10-CM | POA: Diagnosis not present

## 2021-02-14 DIAGNOSIS — Z8719 Personal history of other diseases of the digestive system: Secondary | ICD-10-CM | POA: Diagnosis not present

## 2021-02-14 LAB — CBC WITH DIFFERENTIAL (CANCER CENTER ONLY)
Abs Immature Granulocytes: 0.02 10*3/uL (ref 0.00–0.07)
Basophils Absolute: 0 10*3/uL (ref 0.0–0.1)
Basophils Relative: 1 %
Eosinophils Absolute: 0.1 10*3/uL (ref 0.0–0.5)
Eosinophils Relative: 4 %
HCT: 35 % — ABNORMAL LOW (ref 36.0–46.0)
Hemoglobin: 11.5 g/dL — ABNORMAL LOW (ref 12.0–15.0)
Immature Granulocytes: 1 %
Lymphocytes Relative: 21 %
Lymphs Abs: 0.7 10*3/uL (ref 0.7–4.0)
MCH: 29.9 pg (ref 26.0–34.0)
MCHC: 32.9 g/dL (ref 30.0–36.0)
MCV: 90.9 fL (ref 80.0–100.0)
Monocytes Absolute: 0.2 10*3/uL (ref 0.1–1.0)
Monocytes Relative: 6 %
Neutro Abs: 2.4 10*3/uL (ref 1.7–7.7)
Neutrophils Relative %: 67 %
Platelet Count: 113 10*3/uL — ABNORMAL LOW (ref 150–400)
RBC: 3.85 MIL/uL — ABNORMAL LOW (ref 3.87–5.11)
RDW: 16.4 % — ABNORMAL HIGH (ref 11.5–15.5)
WBC Count: 3.5 10*3/uL — ABNORMAL LOW (ref 4.0–10.5)
nRBC: 0 % (ref 0.0–0.2)

## 2021-02-14 LAB — CMP (CANCER CENTER ONLY)
ALT: 30 U/L (ref 0–44)
AST: 34 U/L (ref 15–41)
Albumin: 4.1 g/dL (ref 3.5–5.0)
Alkaline Phosphatase: 137 U/L — ABNORMAL HIGH (ref 38–126)
Anion gap: 9 (ref 5–15)
BUN: 9 mg/dL (ref 6–20)
CO2: 24 mmol/L (ref 22–32)
Calcium: 9 mg/dL (ref 8.9–10.3)
Chloride: 104 mmol/L (ref 98–111)
Creatinine: 0.76 mg/dL (ref 0.44–1.00)
GFR, Estimated: >60 mL/min (ref >60)
Glucose, Bld: 151 mg/dL — ABNORMAL HIGH (ref 70–99)
Potassium: 3.7 mmol/L (ref 3.5–5.1)
Sodium: 137 mmol/L (ref 135–145)
Total Bilirubin: 0.8 mg/dL (ref 0.3–1.2)
Total Protein: 6.8 g/dL (ref 6.5–8.1)

## 2021-02-14 MED ORDER — BORTEZOMIB CHEMO SQ INJECTION 3.5 MG (2.5MG/ML)
1.3000 mg/m2 | Freq: Once | INTRAMUSCULAR | Status: AC
  Administered 2021-02-14: 3 mg via SUBCUTANEOUS
  Filled 2021-02-14: qty 1.2

## 2021-02-14 MED ORDER — REVLIMID 25 MG PO CAPS: ORAL_CAPSULE | ORAL | 0 refills | Status: DC

## 2021-02-14 MED ORDER — ONDANSETRON HCL 8 MG PO TABS
8.0000 mg | ORAL_TABLET | Freq: Once | ORAL | Status: AC
  Administered 2021-02-14: 8 mg via ORAL
  Filled 2021-02-14: qty 1

## 2021-02-14 NOTE — Patient Instructions (Signed)
[facility]  Discharge Instructions: Thank you for choosing [facility] to provide your oncology and hematology care.   If you have a lab appointment with the Cancer Center, please go directly to the Cancer Center and check in at the registration area.   Wear comfortable clothing and clothing appropriate for easy access to any Portacath or PICC line.   We strive to give you quality time with your provider. You may need to reschedule your appointment if you arrive late (15 or more minutes).  Arriving late affects you and other patients whose appointments are after yours.  Also, if you miss three or more appointments without notifying the office, you may be dismissed from the clinic at the provider's discretion.      For prescription refill requests, have your pharmacy contact our office and allow 72 hours for refills to be completed.    Today you received the following chemotherapy and/or immunotherapy agent: Bortezomib (Velcade).   To help prevent nausea and vomiting after your treatment, we encourage you to take your nausea medication as directed.  BELOW ARE SYMPTOMS THAT SHOULD BE REPORTED IMMEDIATELY: *FEVER GREATER THAN 100.4 F (38 C) OR HIGHER *CHILLS OR SWEATING *NAUSEA AND VOMITING THAT IS NOT CONTROLLED WITH YOUR NAUSEA MEDICATION *UNUSUAL SHORTNESS OF BREATH *UNUSUAL BRUISING OR BLEEDING *URINARY PROBLEMS (pain or burning when urinating, or frequent urination) *BOWEL PROBLEMS (unusual diarrhea, constipation, pain near the anus) TENDERNESS IN MOUTH AND THROAT WITH OR WITHOUT PRESENCE OF ULCERS (sore throat, sores in mouth, or a toothache) UNUSUAL RASH, SWELLING OR PAIN  UNUSUAL VAGINAL DISCHARGE OR ITCHING   Items with * indicate a potential emergency and should be followed up as soon as possible or go to the Emergency Department if any problems should occur.  Please show the CHEMOTHERAPY ALERT CARD or IMMUNOTHERAPY ALERT CARD at  check-in to the Emergency Department and triage nurse.  Should you have questions after your visit or need to cancel or reschedule your appointment, please contact [facility]  Dept: [phone]  and follow the prompts.  Office hours are 8:00 a.m. to 4:30 p.m. Monday - Friday. Please note that voicemails left after 4:00 p.m. may not be returned until the following business day.  We are closed weekends and major holidays. You have access to a nurse at all times for urgent questions. Please call the main number to the clinic Dept: [phone] and follow the prompts.   For any non-urgent questions, you may also contact your provider using MyChart. We now offer e-Visits for anyone 18 and older to request care online for non-urgent symptoms. For details visit [email or web address].   Also download the MyChart app! Go to the app store, search "MyChart", open the app, select [facility], and log in with your MyChart username and password.  Due to Covid, a mask is required upon entering the hospital/clinic. If you do not have a mask, one will be given to you upon arrival. For doctor visits, patients may have 1 support person aged 18 or older with them. For treatment visits, patients cannot have anyone with them due to current Covid guidelines and our immunocompromised population.   

## 2021-02-21 ENCOUNTER — Other Ambulatory Visit: Payer: Self-pay

## 2021-02-21 ENCOUNTER — Inpatient Hospital Stay: Payer: 59

## 2021-02-21 ENCOUNTER — Inpatient Hospital Stay: Payer: 59 | Admitting: Internal Medicine

## 2021-02-21 VITALS — BP 153/61 | HR 59 | Temp 97.9°F | Resp 19 | Ht 67.5 in | Wt 251.6 lb

## 2021-02-21 DIAGNOSIS — Z5111 Encounter for antineoplastic chemotherapy: Secondary | ICD-10-CM

## 2021-02-21 DIAGNOSIS — C9000 Multiple myeloma not having achieved remission: Secondary | ICD-10-CM

## 2021-02-21 DIAGNOSIS — Z5112 Encounter for antineoplastic immunotherapy: Secondary | ICD-10-CM | POA: Diagnosis not present

## 2021-02-21 LAB — CBC WITH DIFFERENTIAL (CANCER CENTER ONLY)
Abs Immature Granulocytes: 0.02 10*3/uL (ref 0.00–0.07)
Basophils Absolute: 0 10*3/uL (ref 0.0–0.1)
Basophils Relative: 1 %
Eosinophils Absolute: 0.1 10*3/uL (ref 0.0–0.5)
Eosinophils Relative: 4 %
HCT: 32.2 % — ABNORMAL LOW (ref 36.0–46.0)
Hemoglobin: 10.7 g/dL — ABNORMAL LOW (ref 12.0–15.0)
Immature Granulocytes: 1 %
Lymphocytes Relative: 19 %
Lymphs Abs: 0.7 10*3/uL (ref 0.7–4.0)
MCH: 30 pg (ref 26.0–34.0)
MCHC: 33.2 g/dL (ref 30.0–36.0)
MCV: 90.2 fL (ref 80.0–100.0)
Monocytes Absolute: 0.4 10*3/uL (ref 0.1–1.0)
Monocytes Relative: 9 %
Neutro Abs: 2.5 10*3/uL (ref 1.7–7.7)
Neutrophils Relative %: 66 %
Platelet Count: 91 10*3/uL — ABNORMAL LOW (ref 150–400)
RBC: 3.57 MIL/uL — ABNORMAL LOW (ref 3.87–5.11)
RDW: 16.4 % — ABNORMAL HIGH (ref 11.5–15.5)
WBC Count: 3.7 10*3/uL — ABNORMAL LOW (ref 4.0–10.5)
nRBC: 0 % (ref 0.0–0.2)

## 2021-02-21 LAB — CMP (CANCER CENTER ONLY)
ALT: 33 U/L (ref 0–44)
AST: 38 U/L (ref 15–41)
Albumin: 3.8 g/dL (ref 3.5–5.0)
Alkaline Phosphatase: 120 U/L (ref 38–126)
Anion gap: 9 (ref 5–15)
BUN: 10 mg/dL (ref 6–20)
CO2: 21 mmol/L — ABNORMAL LOW (ref 22–32)
Calcium: 8.6 mg/dL — ABNORMAL LOW (ref 8.9–10.3)
Chloride: 104 mmol/L (ref 98–111)
Creatinine: 0.64 mg/dL (ref 0.44–1.00)
GFR, Estimated: >60 mL/min (ref >60)
Glucose, Bld: 162 mg/dL — ABNORMAL HIGH (ref 70–99)
Potassium: 3.4 mmol/L — ABNORMAL LOW (ref 3.5–5.1)
Sodium: 134 mmol/L — ABNORMAL LOW (ref 135–145)
Total Bilirubin: 0.5 mg/dL (ref 0.3–1.2)
Total Protein: 6.4 g/dL — ABNORMAL LOW (ref 6.5–8.1)

## 2021-02-21 MED ORDER — REVLIMID 25 MG PO CAPS: ORAL_CAPSULE | ORAL | 0 refills | Status: DC

## 2021-02-21 MED ORDER — ONDANSETRON HCL 8 MG PO TABS
8.0000 mg | ORAL_TABLET | Freq: Once | ORAL | Status: AC
  Administered 2021-02-21: 8 mg via ORAL
  Filled 2021-02-21: qty 1

## 2021-02-21 MED ORDER — BORTEZOMIB CHEMO SQ INJECTION 3.5 MG (2.5MG/ML)
1.3000 mg/m2 | Freq: Once | INTRAMUSCULAR | Status: AC
  Administered 2021-02-21: 3 mg via SUBCUTANEOUS
  Filled 2021-02-21: qty 1.2

## 2021-02-21 NOTE — Progress Notes (Signed)
[facility] Telephone:(336) [phone]   Fax:([phone]  OFFICE PROGRESS NOTE  [name], MD [place] [address]  DIAGNOSIS: Plasma cell dyscrasia initially diagnosed as MGUS in September 2010, with additional symptoms suggestive of POEMS syndrome.   PRIOR THERAPY:  1) Velcade 1.3 MG/M2 subcutaneously with Decadron 40 mg by mouth on a weekly basis. First cycle 11/24/2013. She status post 31 weekly doses of treatment. 2) Velcade 1.3 MG/M2 subcutaneously and weekly basis with Decadron 40 mg by mouth weekly. First dose 02/01/2015. Status post 28 cycles. 3) Revlimid 25 mg by mouth daily for 21 days every 4 weeks with weekly Decadron 20 mg. started in 11/27/2015. Status post 3 cycles discontinued secondary to lack of response.  CURRENT THERAPY: Systemic treatment with Velcade 1.3 MG/KG weekly, Revlimid 25 mg by mouth daily for 21 days every 4 weeks in addition to Decadron 20 mg by mouth weekly. First dose 03/06/2016. Status post 38 cycles.  She has a break off treatment from June 2021 until July 2022.  INTERVAL HISTORY: Brenda Conner 61 y.o. female returns to the clinic today for follow-up visit.  The patient is feeling fine today with no concerning complaints except for mild fatigue.  She enjoyed 2 weeks of holiday in [place] with her family.  She denied having any current chest pain, shortness of breath, cough or hemoptysis.  She has no nausea, vomiting, diarrhea or constipation.  She has no headache or visual changes.  She continues to tolerate her treatment fairly well.  She is here for evaluation before starting cycle #39.  MEDICAL HISTORY: Past Medical History:  Diagnosis Date   Acid reflux    Anxiety    Asthma    Cancer ([facility])    waldenstroms/ macroglobinulemia   Depression    Depression    Diabetes mellitus without complication ([facility] [facility])    Dysuria 02/28/2016   Hypercholesteremia    Hypertension    Hypothyroidism     Macroglobulinemia ([facility])    ? POEMS syndrome   Multiple myeloma ([facility])    Obesity    PONV (postoperative nausea and vomiting)    Sleep apnea    CPAP at bedtime   Sleep apnea     ALLERGIES:  is allergic to codeine, hydrocodone, lortab [hydrocodone-acetaminophen], onion, shellfish allergy, and amoxicillin.  MEDICATIONS:  Current Outpatient Medications  Medication Sig Dispense Refill   ACCU-CHEK AVIVA PLUS test strip USE TO CHECK SUGAR TWICE A DAY 90     acyclovir (ZOVIRAX) 400 MG tablet Take 1 tablet (400 mg total) by mouth 2 (two) times daily. 180 tablet 1   ALPRAZolam (XANAX) 0.5 MG tablet Take 1 tablet (0.5 mg total) by mouth at bedtime as needed for anxiety. 30 tablet 1   aspirin 81 MG chewable tablet Chew by mouth daily.     Azelastine HCl 0.15 % SOLN as needed.     B-D UF III MINI PEN NEEDLES 31G X 5 MM MISC      Blood Glucose Monitoring Suppl (ONE TOUCH ULTRA MINI) W/DEVICE KIT See admin instructions. Reported on 01/25/2015  0   bortezomib IV (VELCADE) 3.5 MG injection Inject 1.3 mg/m2 into the vein once.     Cetirizine HCl (ZYRTEC ALLERGY PO) Take by mouth daily.     dexamethasone (DECADRON) 4 MG tablet 5 tablets p.o. weekly on the day of chemotherapy. 60 tablet 2   DiphenhydrAMINE HCl (BENADRYL ALLERGY PO) Take by mouth as  needed.     EPIPEN 2-PAK 0.3 MG/0.3ML SOAJ injection Reported on 06/21/2015 (Patient not taking: Reported on 12/20/2020)  1   Fexofenadine HCl (ALLEGRA ALLERGY PO) Take by mouth daily.     Fluticasone-Salmeterol (ADVAIR) 100-50 MCG/DOSE AEPB Inhale 2 puffs into the lungs every 12 (twelve) hours.     ibuprofen (ADVIL,MOTRIN) 100 MG tablet Take 100 mg by mouth every 6 (six) hours as needed. Reported on 05/31/2015 (Patient not taking: Reported on 12/20/2020)     Insulin Glargine (BASAGLAR KWIKPEN) 100 UNIT/ML Inject 20 Units into the skin at bedtime.     levothyroxine (SYNTHROID, LEVOTHROID) 150 MCG tablet Take 150 mcg by mouth daily before breakfast.     lisinopril  (PRINIVIL,ZESTRIL) 10 MG tablet Take 10 mg by mouth daily. auth number 05/29/2018 [id number]  3   loperamide (IMODIUM) 2 MG capsule Take 2 mg by mouth as needed for diarrhea or loose stools. (Patient not taking: Reported on 12/20/2020)     magic mouthwash w/lidocaine SOLN Take 5 mLs by mouth 4 (four) times daily as needed for mouth pain. Swish, Gargle, and spit 240 mL 1   metFORMIN (GLUCOPHAGE-XR) 500 MG 24 hr tablet Take 500 mg by mouth daily.     NOVOLOG FLEXPEN 100 UNIT/ML FlexPen Inject 15 Units into the skin in the morning, at noon, and at bedtime. Per Sliding Scale     omeprazole (PRILOSEC) 40 MG capsule Take 40 mg by mouth 2 (two) times daily.      ondansetron (ZOFRAN-ODT) 8 MG disintegrating tablet Take 1 tablet (8 mg total) by mouth every 8 (eight) hours as needed. Reported on 05/31/2015 (Patient not taking: Reported on 12/20/2020) 30 tablet 2   pravastatin (PRAVACHOL) 40 MG tablet Take 40 mg by mouth every evening.      PROAIR HFA 108 (90 Base) MCG/ACT inhaler Reported on 06/21/2015  1   REVLIMID 25 MG capsule TAKE 1 CAPSULE BY MOUTH ONCE DAILY 21 DAYS ON AND 7 DAYS OFF 01/30/2021 Auth number: Fanny Dance [id number] Adult female not of child bearing age. 21 capsule 0   sertraline (ZOLOFT) 50 MG tablet Take 3 tablets (150 mg total) by mouth daily. 270 tablet 3   verapamil (CALAN-SR) 240 MG CR tablet Take 240 mg by mouth 2 (two) times daily.     No current facility-administered medications for this visit.    SURGICAL HISTORY:  Past Surgical History:  Procedure Laterality Date   ABLATION  09/09/2007   HTA and polyp resection   BACK SURGERY  03/08/2004   herniation, L4-L5   Bil Laprascopic knee surgery     BONE MARROW BIOPSY     2011   BONE MARROW BIOPSY  04/08/2012   BONE MARROW BIOPSY  01/2020   CHOLECYSTECTOMY     FOOT SURGERY  01/09/1996   KNEE ARTHROSCOPY W/ MENISCAL REPAIR  10/10 ; 3/11   LAPAROSCOPIC CHOLECYSTECTOMY  01/09/1995   NASAL SINUS SURGERY  01/08/2002   UTERINE FIBROID  EMBOLIZATION      REVIEW OF SYSTEMS:  A comprehensive review of systems was negative except for: Constitutional: positive for fatigue   PHYSICAL EXAMINATION: General appearance: alert, cooperative, fatigued, and no distress Head: Normocephalic, without obvious abnormality, atraumatic Neck: no adenopathy Lymph nodes: Cervical, supraclavicular, and axillary nodes normal. Resp: clear to auscultation bilaterally Back: symmetric, no curvature. ROM normal. No CVA tenderness. Cardio: regular rate and rhythm, S1, S2 normal, no murmur, click, rub or gallop GI: soft, non-tender; bowel sounds normal; no masses,  no organomegaly  Extremities: extremities normal, atraumatic, no cyanosis or edema  ECOG PERFORMANCE STATUS: 1 - Symptomatic but completely ambulatory  Blood pressure (!) 153/61, pulse (!) 59, temperature 97.9 F (36.6 C), temperature source Tympanic, resp. rate 19, height 5' 7.5" (1.715 m), weight 251 lb 9.6 oz (114.1 kg), SpO2 100 %.  LABORATORY DATA: Lab Results  Component Value Date   WBC 3.7 (L) 02/21/2021   HGB 10.7 (L) 02/21/2021   HCT 32.2 (L) 02/21/2021   MCV 90.2 02/21/2021   PLT 91 (L) 02/21/2021      Chemistry      Component Value Date/Time   NA 137 02/14/2021 1407   NA 138 11/14/2016 0824   K 3.7 02/14/2021 1407   K 4.3 11/14/2016 0824   CL 104 02/14/2021 1407   CL 104 04/07/2012 1415   CO2 24 02/14/2021 1407   CO2 23 11/14/2016 0824   BUN 9 02/14/2021 1407   BUN 9.1 11/14/2016 0824   CREATININE 0.76 02/14/2021 1407   CREATININE 0.7 11/14/2016 0824      Component Value Date/Time   CALCIUM 9.0 02/14/2021 1407   CALCIUM 9.6 11/14/2016 0824   ALKPHOS 137 (H) 02/14/2021 1407   ALKPHOS 94 11/14/2016 0824   AST 34 02/14/2021 1407   AST 28 11/14/2016 0824   ALT 30 02/14/2021 1407   ALT 24 11/14/2016 0824   BILITOT 0.8 02/14/2021 1407   BILITOT 0.41 11/14/2016 0824      ASSESSMENT AND PLAN:  This is a very pleasant 60 years old white female with  smoldering multiple myeloma with questionable POEMS syndrome symptom.  The patient has been on treatment with subcutaneous weekly Velcade as well as Revlimid and Decadron status post 34 cycles.  She has been tolerating this treatment well. The patient has been on observation for the last several months and she has been doing fine with no concerning issues except for the recent upper respiratory infection and viral gastroenteritis. Her myeloma panel showed continuous increase in her free lambda light chain. The patient had a bone marrow biopsy and aspirate recently that showed 3-5% plasma cells still suspicious for plasma cell dyscrasia.  The skeletal bone survey was negative for any lytic lesions. The patient has been off treatment for more than a year between June 2021 until July 2022. She had repeat myeloma panel performed recently that showed significant worsening and increase of her lambda light chain.  She continues to have anemia but no renal insufficiency or hypercalcemia. She resumed her treatment with Velcade, Revlimid and Decadron on July 19, 2020.   The patient has been tolerating her treatment well with no concerning adverse effects. I recommended for the patient to proceed with her treatment today as planned. For the iron deficiency anemia, I will give her a refill of Integra +1 capsule p.o. daily. I will see her back for follow-up visit in 3 weeks for evaluation with repeat myeloma panel. She was advised to call immediately if she has any other concerning symptoms in the interval. All questions were answered. The patient knows to call the clinic with any problems, questions or concerns. We can certainly see the patient much sooner if necessary.  Disclaimer: This note was dictated with voice recognition software. Similar sounding words can inadvertently be transcribed and may not be corrected upon review.

## 2021-02-21 NOTE — Progress Notes (Signed)
Per Dr. Julien Nordmann, pt ok to treat with PLT 91.

## 2021-02-28 ENCOUNTER — Inpatient Hospital Stay: Payer: 59

## 2021-02-28 ENCOUNTER — Other Ambulatory Visit: Payer: Self-pay

## 2021-02-28 VITALS — BP 155/82 | HR 57 | Temp 98.2°F | Resp 18 | Wt 251.0 lb

## 2021-02-28 DIAGNOSIS — Z5112 Encounter for antineoplastic immunotherapy: Secondary | ICD-10-CM | POA: Diagnosis not present

## 2021-02-28 DIAGNOSIS — C9000 Multiple myeloma not having achieved remission: Secondary | ICD-10-CM

## 2021-02-28 LAB — CBC WITH DIFFERENTIAL (CANCER CENTER ONLY)
Abs Immature Granulocytes: 0.01 10*3/uL (ref 0.00–0.07)
Basophils Absolute: 0 10*3/uL (ref 0.0–0.1)
Basophils Relative: 1 %
Eosinophils Absolute: 0.1 10*3/uL (ref 0.0–0.5)
Eosinophils Relative: 5 %
HCT: 32.1 % — ABNORMAL LOW (ref 36.0–46.0)
Hemoglobin: 10.7 g/dL — ABNORMAL LOW (ref 12.0–15.0)
Immature Granulocytes: 0 %
Lymphocytes Relative: 21 %
Lymphs Abs: 0.5 10*3/uL — ABNORMAL LOW (ref 0.7–4.0)
MCH: 30.1 pg (ref 26.0–34.0)
MCHC: 33.3 g/dL (ref 30.0–36.0)
MCV: 90.4 fL (ref 80.0–100.0)
Monocytes Absolute: 0.2 10*3/uL (ref 0.1–1.0)
Monocytes Relative: 9 %
Neutro Abs: 1.6 10*3/uL — ABNORMAL LOW (ref 1.7–7.7)
Neutrophils Relative %: 64 %
Platelet Count: 89 10*3/uL — ABNORMAL LOW (ref 150–400)
RBC: 3.55 MIL/uL — ABNORMAL LOW (ref 3.87–5.11)
RDW: 16.6 % — ABNORMAL HIGH (ref 11.5–15.5)
WBC Count: 2.5 10*3/uL — ABNORMAL LOW (ref 4.0–10.5)
nRBC: 0 % (ref 0.0–0.2)

## 2021-02-28 LAB — CMP (CANCER CENTER ONLY)
ALT: 34 U/L (ref 0–44)
AST: 38 U/L (ref 15–41)
Albumin: 3.9 g/dL (ref 3.5–5.0)
Alkaline Phosphatase: 163 U/L — ABNORMAL HIGH (ref 38–126)
Anion gap: 9 (ref 5–15)
BUN: 8 mg/dL (ref 6–20)
CO2: 24 mmol/L (ref 22–32)
Calcium: 8.5 mg/dL — ABNORMAL LOW (ref 8.9–10.3)
Chloride: 104 mmol/L (ref 98–111)
Creatinine: 0.66 mg/dL (ref 0.44–1.00)
GFR, Estimated: >60 mL/min (ref >60)
Glucose, Bld: 193 mg/dL — ABNORMAL HIGH (ref 70–99)
Potassium: 3.3 mmol/L — ABNORMAL LOW (ref 3.5–5.1)
Sodium: 137 mmol/L (ref 135–145)
Total Bilirubin: 0.7 mg/dL (ref 0.3–1.2)
Total Protein: 6.6 g/dL (ref 6.5–8.1)

## 2021-02-28 MED ORDER — BORTEZOMIB CHEMO SQ INJECTION 3.5 MG (2.5MG/ML)
1.3000 mg/m2 | Freq: Once | INTRAMUSCULAR | Status: AC
  Administered 2021-02-28: 3 mg via SUBCUTANEOUS
  Filled 2021-02-28: qty 1.2

## 2021-02-28 MED ORDER — ONDANSETRON HCL 8 MG PO TABS
8.0000 mg | ORAL_TABLET | Freq: Once | ORAL | Status: AC
  Administered 2021-02-28: 8 mg via ORAL
  Filled 2021-02-28: qty 1

## 2021-02-28 NOTE — Progress Notes (Signed)
[place] for patient to receive treatment today per Dr. Julien Nordmann with current lab values.

## 2021-03-07 ENCOUNTER — Other Ambulatory Visit: Payer: Self-pay

## 2021-03-07 ENCOUNTER — Inpatient Hospital Stay: Payer: 59

## 2021-03-07 VITALS — BP 154/54 | HR 58 | Temp 98.3°F | Resp 16 | Wt 254.4 lb

## 2021-03-07 DIAGNOSIS — C9000 Multiple myeloma not having achieved remission: Secondary | ICD-10-CM

## 2021-03-07 DIAGNOSIS — Z5112 Encounter for antineoplastic immunotherapy: Secondary | ICD-10-CM | POA: Diagnosis not present

## 2021-03-07 LAB — CBC WITH DIFFERENTIAL (CANCER CENTER ONLY)
Abs Immature Granulocytes: 0.01 10*3/uL (ref 0.00–0.07)
Basophils Absolute: 0.1 10*3/uL (ref 0.0–0.1)
Basophils Relative: 2 %
Eosinophils Absolute: 0 10*3/uL (ref 0.0–0.5)
Eosinophils Relative: 1 %
HCT: 31.3 % — ABNORMAL LOW (ref 36.0–46.0)
Hemoglobin: 10.3 g/dL — ABNORMAL LOW (ref 12.0–15.0)
Immature Granulocytes: 0 %
Lymphocytes Relative: 23 %
Lymphs Abs: 0.6 10*3/uL — ABNORMAL LOW (ref 0.7–4.0)
MCH: 30 pg (ref 26.0–34.0)
MCHC: 32.9 g/dL (ref 30.0–36.0)
MCV: 91.3 fL (ref 80.0–100.0)
Monocytes Absolute: 0.3 10*3/uL (ref 0.1–1.0)
Monocytes Relative: 10 %
Neutro Abs: 1.6 10*3/uL — ABNORMAL LOW (ref 1.7–7.7)
Neutrophils Relative %: 64 %
Platelet Count: 102 10*3/uL — ABNORMAL LOW (ref 150–400)
RBC: 3.43 MIL/uL — ABNORMAL LOW (ref 3.87–5.11)
RDW: 16.5 % — ABNORMAL HIGH (ref 11.5–15.5)
WBC Count: 2.6 10*3/uL — ABNORMAL LOW (ref 4.0–10.5)
nRBC: 0 % (ref 0.0–0.2)

## 2021-03-07 LAB — CMP (CANCER CENTER ONLY)
ALT: 26 U/L (ref 0–44)
AST: 29 U/L (ref 15–41)
Albumin: 3.9 g/dL (ref 3.5–5.0)
Alkaline Phosphatase: 150 U/L — ABNORMAL HIGH (ref 38–126)
Anion gap: 5 (ref 5–15)
BUN: 11 mg/dL (ref 6–20)
CO2: 25 mmol/L (ref 22–32)
Calcium: 8.6 mg/dL — ABNORMAL LOW (ref 8.9–10.3)
Chloride: 108 mmol/L (ref 98–111)
Creatinine: 0.68 mg/dL (ref 0.44–1.00)
GFR, Estimated: >60 mL/min (ref >60)
Glucose, Bld: 175 mg/dL — ABNORMAL HIGH (ref 70–99)
Potassium: 3.5 mmol/L (ref 3.5–5.1)
Sodium: 138 mmol/L (ref 135–145)
Total Bilirubin: 0.6 mg/dL (ref 0.3–1.2)
Total Protein: 6.5 g/dL (ref 6.5–8.1)

## 2021-03-07 LAB — LACTATE DEHYDROGENASE: LDH: 129 U/L (ref 98–192)

## 2021-03-07 MED ORDER — BORTEZOMIB CHEMO SQ INJECTION 3.5 MG (2.5MG/ML)
1.3000 mg/m2 | Freq: Once | INTRAMUSCULAR | Status: AC
  Administered 2021-03-07: 3 mg via SUBCUTANEOUS
  Filled 2021-03-07: qty 1.2

## 2021-03-07 MED ORDER — ONDANSETRON HCL 8 MG PO TABS
8.0000 mg | ORAL_TABLET | Freq: Once | ORAL | Status: AC
  Administered 2021-03-07: 8 mg via ORAL
  Filled 2021-03-07: qty 1

## 2021-03-07 NOTE — Patient Instructions (Signed)
[facility]  Discharge Instructions: Thank you for choosing [facility] to provide your oncology and hematology care.   If you have a lab appointment with the Cancer Center, please go directly to the Cancer Center and check in at the registration area.   Wear comfortable clothing and clothing appropriate for easy access to any Portacath or PICC line.   We strive to give you quality time with your provider. You may need to reschedule your appointment if you arrive late (15 or more minutes).  Arriving late affects you and other patients whose appointments are after yours.  Also, if you miss three or more appointments without notifying the office, you may be dismissed from the clinic at the provider's discretion.      For prescription refill requests, have your pharmacy contact our office and allow 72 hours for refills to be completed.    Today you received the following chemotherapy and/or immunotherapy agent: Bortezomib (Velcade).   To help prevent nausea and vomiting after your treatment, we encourage you to take your nausea medication as directed.  BELOW ARE SYMPTOMS THAT SHOULD BE REPORTED IMMEDIATELY: *FEVER GREATER THAN 100.4 F (38 C) OR HIGHER *CHILLS OR SWEATING *NAUSEA AND VOMITING THAT IS NOT CONTROLLED WITH YOUR NAUSEA MEDICATION *UNUSUAL SHORTNESS OF BREATH *UNUSUAL BRUISING OR BLEEDING *URINARY PROBLEMS (pain or burning when urinating, or frequent urination) *BOWEL PROBLEMS (unusual diarrhea, constipation, pain near the anus) TENDERNESS IN MOUTH AND THROAT WITH OR WITHOUT PRESENCE OF ULCERS (sore throat, sores in mouth, or a toothache) UNUSUAL RASH, SWELLING OR PAIN  UNUSUAL VAGINAL DISCHARGE OR ITCHING   Items with * indicate a potential emergency and should be followed up as soon as possible or go to the Emergency Department if any problems should occur.  Please show the CHEMOTHERAPY ALERT CARD or IMMUNOTHERAPY ALERT CARD at  check-in to the Emergency Department and triage nurse.  Should you have questions after your visit or need to cancel or reschedule your appointment, please contact [facility]  Dept: [phone]  and follow the prompts.  Office hours are 8:00 a.m. to 4:30 p.m. Monday - Friday. Please note that voicemails left after 4:00 p.m. may not be returned until the following business day.  We are closed weekends and major holidays. You have access to a nurse at all times for urgent questions. Please call the main number to the clinic Dept: [phone] and follow the prompts.   For any non-urgent questions, you may also contact your provider using MyChart. We now offer e-Visits for anyone 18 and older to request care online for non-urgent symptoms. For details visit [email or web address].   Also download the MyChart app! Go to the app store, search "MyChart", open the app, select [facility], and log in with your MyChart username and password.  Due to Covid, a mask is required upon entering the hospital/clinic. If you do not have a mask, one will be given to you upon arrival. For doctor visits, patients may have 1 support person aged 18 or older with them. For treatment visits, patients cannot have anyone with them due to current Covid guidelines and our immunocompromised population.   

## 2021-03-08 LAB — IGG, IGA, IGM
IgA: 115 mg/dL (ref 87–352)
IgG (Immunoglobin G), Serum: 403 mg/dL — ABNORMAL LOW (ref 586–1602)
IgM (Immunoglobulin M), Srm: 500 mg/dL — ABNORMAL HIGH (ref 26–217)

## 2021-03-08 LAB — KAPPA/LAMBDA LIGHT CHAINS
Kappa free light chain: 13.6 mg/L (ref 3.3–19.4)
Kappa, lambda light chain ratio: 0.03 — ABNORMAL LOW (ref 0.26–1.65)
Lambda free light chains: 495.4 mg/L — ABNORMAL HIGH (ref 5.7–26.3)

## 2021-03-08 LAB — BETA 2 MICROGLOBULIN, SERUM: Beta-2 Microglobulin: 1.5 mg/L (ref 0.6–2.4)

## 2021-03-14 ENCOUNTER — Inpatient Hospital Stay: Payer: 59 | Admitting: Internal Medicine

## 2021-03-14 ENCOUNTER — Encounter: Payer: Self-pay | Admitting: Internal Medicine

## 2021-03-14 ENCOUNTER — Inpatient Hospital Stay: Payer: 59 | Attending: Physician Assistant

## 2021-03-14 ENCOUNTER — Inpatient Hospital Stay: Payer: 59

## 2021-03-14 ENCOUNTER — Other Ambulatory Visit: Payer: Self-pay

## 2021-03-14 ENCOUNTER — Telehealth: Payer: Self-pay | Admitting: Internal Medicine

## 2021-03-14 VITALS — BP 138/63 | HR 66 | Temp 98.0°F | Resp 18 | Wt 249.5 lb

## 2021-03-14 DIAGNOSIS — D509 Iron deficiency anemia, unspecified: Secondary | ICD-10-CM | POA: Diagnosis not present

## 2021-03-14 DIAGNOSIS — C9000 Multiple myeloma not having achieved remission: Secondary | ICD-10-CM | POA: Diagnosis not present

## 2021-03-14 DIAGNOSIS — R5383 Other fatigue: Secondary | ICD-10-CM | POA: Diagnosis not present

## 2021-03-14 DIAGNOSIS — Z79899 Other long term (current) drug therapy: Secondary | ICD-10-CM | POA: Diagnosis not present

## 2021-03-14 DIAGNOSIS — Z5112 Encounter for antineoplastic immunotherapy: Secondary | ICD-10-CM | POA: Diagnosis not present

## 2021-03-14 DIAGNOSIS — R0609 Other forms of dyspnea: Secondary | ICD-10-CM | POA: Insufficient documentation

## 2021-03-14 DIAGNOSIS — Z5111 Encounter for antineoplastic chemotherapy: Secondary | ICD-10-CM

## 2021-03-14 DIAGNOSIS — C9030 Solitary plasmacytoma not having achieved remission: Secondary | ICD-10-CM | POA: Diagnosis present

## 2021-03-14 LAB — CBC WITH DIFFERENTIAL (CANCER CENTER ONLY)
Abs Immature Granulocytes: 0.01 10*3/uL (ref 0.00–0.07)
Basophils Absolute: 0 10*3/uL (ref 0.0–0.1)
Basophils Relative: 1 %
Eosinophils Absolute: 0.1 10*3/uL (ref 0.0–0.5)
Eosinophils Relative: 3 %
HCT: 32.7 % — ABNORMAL LOW (ref 36.0–46.0)
Hemoglobin: 10.6 g/dL — ABNORMAL LOW (ref 12.0–15.0)
Immature Granulocytes: 0 %
Lymphocytes Relative: 24 %
Lymphs Abs: 0.6 10*3/uL — ABNORMAL LOW (ref 0.7–4.0)
MCH: 29.6 pg (ref 26.0–34.0)
MCHC: 32.4 g/dL (ref 30.0–36.0)
MCV: 91.3 fL (ref 80.0–100.0)
Monocytes Absolute: 0.1 10*3/uL (ref 0.1–1.0)
Monocytes Relative: 6 %
Neutro Abs: 1.5 10*3/uL — ABNORMAL LOW (ref 1.7–7.7)
Neutrophils Relative %: 66 %
Platelet Count: 86 10*3/uL — ABNORMAL LOW (ref 150–400)
RBC: 3.58 MIL/uL — ABNORMAL LOW (ref 3.87–5.11)
RDW: 15.9 % — ABNORMAL HIGH (ref 11.5–15.5)
WBC Count: 2.4 10*3/uL — ABNORMAL LOW (ref 4.0–10.5)
nRBC: 0 % (ref 0.0–0.2)

## 2021-03-14 LAB — CMP (CANCER CENTER ONLY)
ALT: 27 U/L (ref 0–44)
AST: 35 U/L (ref 15–41)
Albumin: 4 g/dL (ref 3.5–5.0)
Alkaline Phosphatase: 136 U/L — ABNORMAL HIGH (ref 38–126)
Anion gap: 8 (ref 5–15)
BUN: 6 mg/dL — ABNORMAL LOW (ref 8–23)
CO2: 25 mmol/L (ref 22–32)
Calcium: 9 mg/dL (ref 8.9–10.3)
Chloride: 106 mmol/L (ref 98–111)
Creatinine: 0.67 mg/dL (ref 0.44–1.00)
GFR, Estimated: >60 mL/min (ref >60)
Glucose, Bld: 167 mg/dL — ABNORMAL HIGH (ref 70–99)
Potassium: 3.4 mmol/L — ABNORMAL LOW (ref 3.5–5.1)
Sodium: 139 mmol/L (ref 135–145)
Total Bilirubin: 0.9 mg/dL (ref 0.3–1.2)
Total Protein: 6.7 g/dL (ref 6.5–8.1)

## 2021-03-14 MED ORDER — BORTEZOMIB CHEMO SQ INJECTION 3.5 MG (2.5MG/ML)
1.3000 mg/m2 | Freq: Once | INTRAMUSCULAR | Status: AC
  Administered 2021-03-14: 3 mg via SUBCUTANEOUS
  Filled 2021-03-14: qty 1.2

## 2021-03-14 MED ORDER — ONDANSETRON HCL 8 MG PO TABS
8.0000 mg | ORAL_TABLET | Freq: Once | ORAL | Status: AC
  Administered 2021-03-14: 8 mg via ORAL
  Filled 2021-03-14: qty 1

## 2021-03-14 NOTE — Progress Notes (Signed)
[facility] Telephone:([phone]   Fax:(336) [phone]  OFFICE PROGRESS NOTE  [name], MD [place] [address]  DIAGNOSIS: Plasma cell dyscrasia initially diagnosed as MGUS in September 2010, with additional symptoms suggestive of POEMS syndrome.   PRIOR THERAPY:  1) Velcade 1.3 MG/M2 subcutaneously with Decadron 40 mg by mouth on a weekly basis. First cycle 11/24/2013. She status post 31 weekly doses of treatment. 2) Velcade 1.3 MG/M2 subcutaneously and weekly basis with Decadron 40 mg by mouth weekly. First dose 02/01/2015. Status post 28 cycles. 3) Revlimid 25 mg by mouth daily for 21 days every 4 weeks with weekly Decadron 20 mg. started in 11/27/2015. Status post 3 cycles discontinued secondary to lack of response.  CURRENT THERAPY: Systemic treatment with Velcade 1.3 MG/KG weekly, Revlimid 25 mg by mouth daily for 21 days every 4 weeks in addition to Decadron 20 mg by mouth weekly. First dose 03/06/2016. Status post 40 cycles.  She has a break off treatment from June 2021 until July 2022.  INTERVAL HISTORY: Brenda Conner 61 y.o. female returns to the clinic today for follow-up visit.  The patient is feeling fine today with no concerning complaints except for the baseline fatigue.  She has been tolerating her treatment with subcutaneous Velcade, Revlimid and Decadron fairly well.  She denied having any nausea, vomiting, diarrhea or constipation.  She has no headache or visual changes.  She has no chest pain but has shortness of breath with exertion with no cough or hemoptysis.  She is here today for evaluation after repeating the myeloma panel for evaluation of her disease.  MEDICAL HISTORY: Past Medical History:  Diagnosis Date   Acid reflux    Anxiety    Asthma    Cancer ([facility])    waldenstroms/ macroglobinulemia   Depression    Depression    Diabetes mellitus without complication ([facility])    Dysuria 02/28/2016    Hypercholesteremia    Hypertension    Hypothyroidism    Macroglobulinemia ([facility])    ? POEMS syndrome   Multiple myeloma ([facility])    Obesity    PONV (postoperative nausea and vomiting)    Sleep apnea    CPAP at bedtime   Sleep apnea     ALLERGIES:  is allergic to codeine, hydrocodone, lortab [hydrocodone-acetaminophen], onion, shellfish allergy, and amoxicillin.  MEDICATIONS:  Current Outpatient Medications  Medication Sig Dispense Refill   ACCU-CHEK AVIVA PLUS test strip USE TO CHECK SUGAR TWICE A DAY 90     acyclovir (ZOVIRAX) 400 MG tablet Take 1 tablet (400 mg total) by mouth 2 (two) times daily. 180 tablet 1   ALPRAZolam (XANAX) 0.5 MG tablet Take 1 tablet (0.5 mg total) by mouth at bedtime as needed for anxiety. 30 tablet 1   aspirin 81 MG chewable tablet Chew by mouth daily.     Azelastine HCl 0.15 % SOLN as needed.     B-D UF III MINI PEN NEEDLES 31G X 5 MM MISC      Blood Glucose Monitoring Suppl (ONE TOUCH ULTRA MINI) W/DEVICE KIT See admin instructions. Reported on 01/25/2015  0   bortezomib IV (VELCADE) 3.5 MG injection Inject 1.3 mg/m2 into the vein once.     Cetirizine HCl (ZYRTEC ALLERGY PO) Take by mouth daily.     dexamethasone (DECADRON) 4 MG tablet 5 tablets p.o. weekly on the day of chemotherapy. 60 tablet 2   DiphenhydrAMINE HCl (  BENADRYL ALLERGY PO) Take by mouth as needed.     EPIPEN 2-PAK 0.3 MG/0.3ML SOAJ injection Reported on 06/21/2015 (Patient not taking: Reported on 12/20/2020)  1   Fexofenadine HCl (ALLEGRA ALLERGY PO) Take by mouth daily.     Fluticasone-Salmeterol (ADVAIR) 100-50 MCG/DOSE AEPB Inhale 2 puffs into the lungs every 12 (twelve) hours.     ibuprofen (ADVIL,MOTRIN) 100 MG tablet Take 100 mg by mouth every 6 (six) hours as needed. Reported on 05/31/2015 (Patient not taking: Reported on 12/20/2020)     Insulin Glargine (BASAGLAR KWIKPEN) 100 UNIT/ML Inject 20 Units into the skin at bedtime.     levothyroxine (SYNTHROID, LEVOTHROID) 150 MCG tablet  Take 150 mcg by mouth daily before breakfast.     lisinopril (PRINIVIL,ZESTRIL) 10 MG tablet Take 10 mg by mouth daily. auth number 05/29/2018 [id number]  3   loperamide (IMODIUM) 2 MG capsule Take 2 mg by mouth as needed for diarrhea or loose stools. (Patient not taking: Reported on 12/20/2020)     magic mouthwash w/lidocaine SOLN Take 5 mLs by mouth 4 (four) times daily as needed for mouth pain. Swish, Gargle, and spit 240 mL 1   metFORMIN (GLUCOPHAGE-XR) 500 MG 24 hr tablet Take 500 mg by mouth daily.     NOVOLOG FLEXPEN 100 UNIT/ML FlexPen Inject 15 Units into the skin in the morning, at noon, and at bedtime. Per Sliding Scale     omeprazole (PRILOSEC) 40 MG capsule Take 40 mg by mouth 2 (two) times daily.      ondansetron (ZOFRAN-ODT) 8 MG disintegrating tablet Take 1 tablet (8 mg total) by mouth every 8 (eight) hours as needed. Reported on 05/31/2015 (Patient not taking: Reported on 12/20/2020) 30 tablet 2   pravastatin (PRAVACHOL) 40 MG tablet Take 40 mg by mouth every evening.      PROAIR HFA 108 (90 Base) MCG/ACT inhaler Reported on 06/21/2015  1   REVLIMID 25 MG capsule TAKE 1 CAPSULE BY MOUTH ONCE DAILY 21 DAYS ON AND 7 DAYS OFF 02/21/2021 Auth number: Fanny Dance [id number]. Adult female not of child bearing age. 21 capsule 0   sertraline (ZOLOFT) 50 MG tablet Take 3 tablets (150 mg total) by mouth daily. 270 tablet 3   verapamil (CALAN-SR) 240 MG CR tablet Take 240 mg by mouth 2 (two) times daily.     No current facility-administered medications for this visit.    SURGICAL HISTORY:  Past Surgical History:  Procedure Laterality Date   ABLATION  09/09/2007   HTA and polyp resection   BACK SURGERY  03/08/2004   herniation, L4-L5   Bil Laprascopic knee surgery     BONE MARROW BIOPSY     2011   BONE MARROW BIOPSY  04/08/2012   BONE MARROW BIOPSY  01/2020   CHOLECYSTECTOMY     FOOT SURGERY  01/09/1996   KNEE ARTHROSCOPY W/ MENISCAL REPAIR  10/10 ; 3/11   LAPAROSCOPIC CHOLECYSTECTOMY   01/09/1995   NASAL SINUS SURGERY  01/08/2002   UTERINE FIBROID EMBOLIZATION      REVIEW OF SYSTEMS:  A comprehensive review of systems was negative except for: Constitutional: positive for fatigue Respiratory: positive for dyspnea on exertion   PHYSICAL EXAMINATION: General appearance: alert, cooperative, fatigued, and no distress Head: Normocephalic, without obvious abnormality, atraumatic Neck: no adenopathy Lymph nodes: Cervical, supraclavicular, and axillary nodes normal. Resp: clear to auscultation bilaterally Back: symmetric, no curvature. ROM normal. No CVA tenderness. Cardio: regular rate and rhythm, S1, S2 normal, no murmur, click, rub  or gallop GI: soft, non-tender; bowel sounds normal; no masses,  no organomegaly Extremities: extremities normal, atraumatic, no cyanosis or edema  ECOG PERFORMANCE STATUS: 1 - Symptomatic but completely ambulatory  Blood pressure 138/63, pulse 66, temperature 98 F (36.7 C), temperature source Tympanic, resp. rate 18, weight 249 lb 8 oz (113.2 kg), SpO2 99 %.  LABORATORY DATA: Lab Results  Component Value Date   WBC 2.4 (L) 03/14/2021   HGB 10.6 (L) 03/14/2021   HCT 32.7 (L) 03/14/2021   MCV 91.3 03/14/2021   PLT 86 (L) 03/14/2021      Chemistry      Component Value Date/Time   NA 139 03/14/2021 0947   NA 138 11/14/2016 0824   K 3.4 (L) 03/14/2021 0947   K 4.3 11/14/2016 0824   CL 106 03/14/2021 0947   CL 104 04/07/2012 1415   CO2 25 03/14/2021 0947   CO2 23 11/14/2016 0824   BUN 6 (L) 03/14/2021 0947   BUN 9.1 11/14/2016 0824   CREATININE 0.67 03/14/2021 0947   CREATININE 0.7 11/14/2016 0824      Component Value Date/Time   CALCIUM 9.0 03/14/2021 0947   CALCIUM 9.6 11/14/2016 0824   ALKPHOS 136 (H) 03/14/2021 0947   ALKPHOS 94 11/14/2016 0824   AST 35 03/14/2021 0947   AST 28 11/14/2016 0824   ALT 27 03/14/2021 0947   ALT 24 11/14/2016 0824   BILITOT 0.9 03/14/2021 0947   BILITOT 0.41 11/14/2016 0824       ASSESSMENT AND PLAN:  This is a very pleasant 61 years old white female with smoldering multiple myeloma with questionable POEMS syndrome symptom.  The patient has been on treatment with subcutaneous weekly Velcade as well as Revlimid and Decadron status post 34 cycles.  She has been tolerating this treatment well. The patient has been on observation for the last several months and she has been doing fine with no concerning issues except for the recent upper respiratory infection and viral gastroenteritis. Her myeloma panel showed continuous increase in her free lambda light chain. The patient had a bone marrow biopsy and aspirate recently that showed 3-5% plasma cells still suspicious for plasma cell dyscrasia.  The skeletal bone survey was negative for any lytic lesions. The patient has been off treatment for more than a year between June 2021 until July 2022. She had repeat myeloma panel performed recently that showed significant worsening and increase of her lambda light chain.  She continues to have anemia but no renal insufficiency or hypercalcemia. She resumed her treatment with Velcade, Revlimid and Decadron on July 19, 2020.   The patient continues to tolerate her treatment well with no concerning adverse effects. Her myeloma panel performed recently showed improvement in the protein study. I recommended for her to continue her current treatment with subcutaneous Velcade, Revlimid and Decadron as planned. For the iron deficiency anemia, I will give her a refill of Integra +1 capsule p.o. daily. The patient will come back for follow-up visit in 4 weeks for evaluation.  She was advised to call immediately if she has any other concerning symptoms in the interval. All questions were answered. The patient knows to call the clinic with any problems, questions or concerns. We can certainly see the patient much sooner if necessary.  Disclaimer: This note was dictated with voice recognition  software. Similar sounding words can inadvertently be transcribed and may not be corrected upon review.

## 2021-03-14 NOTE — Progress Notes (Signed)
Per Dr. Julien Nordmann , it is okay to treat pt today with Velcade,and platelets of 86k. ?

## 2021-03-14 NOTE — Telephone Encounter (Signed)
Scheduled per 02/28 los, patient has been called and voicemail was left. ?

## 2021-03-14 NOTE — Patient Instructions (Signed)
[facility]  Discharge Instructions: ?Thank you for choosing [facility] to provide your oncology and hematology care.  ? ?If you have a lab appointment with the [facility], please go directly to the [facility] and check in at the registration area. ?  ?Wear comfortable clothing and clothing appropriate for easy access to any Portacath or PICC line.  ? ?We strive to give you quality time with your provider. You may need to reschedule your appointment if you arrive late (15 or more minutes).  Arriving late affects you and other patients whose appointments are after yours.  Also, if you miss three or more appointments without notifying the office, you may be dismissed from the clinic at the provider?s discretion.    ?  ?For prescription refill requests, have your pharmacy contact our office and allow 72 hours for refills to be completed.   ? ?Today you received the following chemotherapy and/or immunotherapy agents velcade    ?  ?To help prevent nausea and vomiting after your treatment, we encourage you to take your nausea medication as directed. ? ?BELOW ARE SYMPTOMS THAT SHOULD BE REPORTED IMMEDIATELY: ?*FEVER GREATER THAN 100.4 F (38 ?C) OR HIGHER ?*CHILLS OR SWEATING ?*NAUSEA AND VOMITING THAT IS NOT CONTROLLED WITH YOUR NAUSEA MEDICATION ?*UNUSUAL SHORTNESS OF BREATH ?*UNUSUAL BRUISING OR BLEEDING ?*URINARY PROBLEMS (pain or burning when urinating, or frequent urination) ?*BOWEL PROBLEMS (unusual diarrhea, constipation, pain near the anus) ?TENDERNESS IN MOUTH AND THROAT WITH OR WITHOUT PRESENCE OF ULCERS (sore throat, sores in mouth, or a toothache) ?UNUSUAL RASH, SWELLING OR PAIN  ?UNUSUAL VAGINAL DISCHARGE OR ITCHING  ? ?Items with * indicate a potential emergency and should be followed up as soon as possible or go to the Emergency Department if any problems should occur. ? ?Please show the CHEMOTHERAPY ALERT CARD or IMMUNOTHERAPY ALERT CARD at check-in to the  Emergency Department and triage nurse. ? ?Should you have questions after your visit or need to cancel or reschedule your appointment, please contact [facility]  Dept: [phone]  and follow the prompts.  Office hours are 8:00 a.m. to 4:30 p.m. Monday - Friday. Please note that voicemails left after 4:00 p.m. may not be returned until the following business day.  We are closed weekends and major holidays. You have access to a nurse at all times for urgent questions. Please call the main number to the clinic Dept: [phone] and follow the prompts. ? ? ?For any non-urgent questions, you may also contact your provider using MyChart. We now offer e-Visits for anyone 93 and older to request care online for non-urgent symptoms. For details visit [email or web address]. ?  ?Also download the MyChart app! Go to the app store, search "MyChart", open the app, select [facility], and log in with your MyChart username and password. ? ?Due to Covid, a mask is required upon entering the hospital/clinic. If you do not have a mask, one will be given to you upon arrival. For doctor visits, patients may have 1 support person aged 47 or older with them. For treatment visits, patients cannot have anyone with them due to current Covid guidelines and our immunocompromised population.  ? ?

## 2021-03-21 ENCOUNTER — Inpatient Hospital Stay: Payer: 59

## 2021-03-21 ENCOUNTER — Other Ambulatory Visit: Payer: Self-pay

## 2021-03-21 VITALS — BP 145/69 | HR 59 | Temp 97.6°F | Resp 18 | Wt 248.1 lb

## 2021-03-21 DIAGNOSIS — C9000 Multiple myeloma not having achieved remission: Secondary | ICD-10-CM

## 2021-03-21 DIAGNOSIS — Z5112 Encounter for antineoplastic immunotherapy: Secondary | ICD-10-CM | POA: Diagnosis not present

## 2021-03-21 LAB — CMP (CANCER CENTER ONLY)
ALT: 44 U/L (ref 0–44)
AST: 44 U/L — ABNORMAL HIGH (ref 15–41)
Albumin: 4.1 g/dL (ref 3.5–5.0)
Alkaline Phosphatase: 156 U/L — ABNORMAL HIGH (ref 38–126)
Anion gap: 10 (ref 5–15)
BUN: 8 mg/dL (ref 8–23)
CO2: 25 mmol/L (ref 22–32)
Calcium: 9 mg/dL (ref 8.9–10.3)
Chloride: 100 mmol/L (ref 98–111)
Creatinine: 0.74 mg/dL (ref 0.44–1.00)
GFR, Estimated: >60 mL/min (ref >60)
Glucose, Bld: 226 mg/dL — ABNORMAL HIGH (ref 70–99)
Potassium: 4 mmol/L (ref 3.5–5.1)
Sodium: 135 mmol/L (ref 135–145)
Total Bilirubin: 0.7 mg/dL (ref 0.3–1.2)
Total Protein: 6.6 g/dL (ref 6.5–8.1)

## 2021-03-21 LAB — CBC WITH DIFFERENTIAL (CANCER CENTER ONLY)
Abs Immature Granulocytes: 0.01 10*3/uL (ref 0.00–0.07)
Basophils Absolute: 0 10*3/uL (ref 0.0–0.1)
Basophils Relative: 1 %
Eosinophils Absolute: 0.1 10*3/uL (ref 0.0–0.5)
Eosinophils Relative: 3 %
HCT: 34.2 % — ABNORMAL LOW (ref 36.0–46.0)
Hemoglobin: 11.1 g/dL — ABNORMAL LOW (ref 12.0–15.0)
Immature Granulocytes: 0 %
Lymphocytes Relative: 17 %
Lymphs Abs: 0.6 10*3/uL — ABNORMAL LOW (ref 0.7–4.0)
MCH: 29.8 pg (ref 26.0–34.0)
MCHC: 32.5 g/dL (ref 30.0–36.0)
MCV: 91.9 fL (ref 80.0–100.0)
Monocytes Absolute: 0.3 10*3/uL (ref 0.1–1.0)
Monocytes Relative: 10 %
Neutro Abs: 2.4 10*3/uL (ref 1.7–7.7)
Neutrophils Relative %: 69 %
Platelet Count: 89 10*3/uL — ABNORMAL LOW (ref 150–400)
RBC: 3.72 MIL/uL — ABNORMAL LOW (ref 3.87–5.11)
RDW: 16.3 % — ABNORMAL HIGH (ref 11.5–15.5)
WBC Count: 3.4 10*3/uL — ABNORMAL LOW (ref 4.0–10.5)
nRBC: 0 % (ref 0.0–0.2)

## 2021-03-21 MED ORDER — ONDANSETRON HCL 8 MG PO TABS
8.0000 mg | ORAL_TABLET | Freq: Once | ORAL | Status: AC
  Administered 2021-03-21: 8 mg via ORAL
  Filled 2021-03-21: qty 1

## 2021-03-21 MED ORDER — BORTEZOMIB CHEMO SQ INJECTION 3.5 MG (2.5MG/ML)
1.3000 mg/m2 | Freq: Once | INTRAMUSCULAR | Status: AC
  Administered 2021-03-21: 3 mg via SUBCUTANEOUS
  Filled 2021-03-21: qty 1.2

## 2021-03-21 NOTE — Progress Notes (Signed)
Per Julien Nordmann MD, ok to treat with PLT 89. ?

## 2021-03-21 NOTE — Patient Instructions (Signed)
Bortezomib injection ?What is this medication? ?BORTEZOMIB (bor TEZ oh mib) targets proteins in cancer cells and stops the cancer cells from growing. It treats multiple myeloma and mantle cell lymphoma. ?This medicine may be used for other purposes; ask your health care provider or pharmacist if you have questions. ?COMMON BRAND NAME(S): Velcade ?What should I tell my care team before I take this medication? ?They need to know if you have any of these conditions: ?dehydration ?diabetes (high blood sugar) ?heart disease ?liver disease ?tingling of the fingers or toes or other nerve disorder ?an unusual or allergic reaction to bortezomib, mannitol, boron, other medicines, foods, dyes, or preservatives ?pregnant or trying to get pregnant ?breast-feeding ?How should I use this medication? ?This medicine is injected into a vein or under the skin. It is given by a health care provider in a hospital or clinic setting. ?Talk to your health care provider about the use of this medicine in children. Special care may be needed. ?Overdosage: If you think you have taken too much of this medicine contact a poison control center or emergency room at once. ?NOTE: This medicine is only for you. Do not share this medicine with others. ?What if I miss a dose? ?Keep appointments for follow-up doses. It is important not to miss your dose. Call your health care provider if you are unable to keep an appointment. ?What may interact with this medication? ?This medicine may interact with the following medications: ?ketoconazole ?rifampin ?This list may not describe all possible interactions. Give your health care provider a list of all the medicines, herbs, non-prescription drugs, or dietary supplements you use. Also tell them if you smoke, drink alcohol, or use illegal drugs. Some items may interact with your medicine. ?What should I watch for while using this medication? ?Your condition will be monitored carefully while you are receiving  this medicine. ?You may need blood work done while you are taking this medicine. ?You may get drowsy or dizzy. Do not drive, use machinery, or do anything that needs mental alertness until you know how this medicine affects you. Do not stand up or sit up quickly, especially if you are an older patient. This reduces the risk of dizzy or fainting spells ?This medicine may increase your risk of getting an infection. Call your health care provider for advice if you get a fever, chills, sore throat, or other symptoms of a cold or flu. Do not treat yourself. Try to avoid being around people who are sick. ?Check with your health care provider if you have severe diarrhea, nausea, and vomiting, or if you sweat a lot. The loss of too much body fluid may make it dangerous for you to take this medicine. ?Do not become pregnant while taking this medicine or for 7 months after stopping it. Women should inform their health care provider if they wish to become pregnant or think they might be pregnant. Men should not father a child while taking this medicine and for 4 months after stopping it. There is a potential for serious harm to an unborn child. Talk to your health care provider for more information. Do not breast-feed an infant while taking this medicine or for 2 months after stopping it. ?This medicine may make it more difficult to get pregnant or father a child. Talk to your health care provider if you are concerned about your fertility. ?What side effects may I notice from receiving this medication? ?Side effects that you should report to your doctor or health  care professional as soon as possible: ?allergic reactions (skin rash; itching or hives; swelling of the face, lips, or tongue) ?bleeding (bloody or black, tarry stools; red or dark brown urine; spitting up blood or brown material that looks like coffee grounds; red spots on the skin; unusual bruising or bleeding from the eye, gums, or nose) ?blurred vision or changes  in vision ?confusion ?constipation ?headache ?heart failure (trouble breathing; fast, irregular heartbeat; sudden weight gain; swelling of the ankles, feet, hands) ?infection (fever, chills, cough, sore throat, pain or trouble passing urine) ?lack or loss of appetite ?liver injury (dark yellow or brown urine; general ill feeling or flu-like symptoms; loss of appetite, right upper belly pain; yellowing of the eyes or skin) ?low blood pressure (dizziness; feeling faint or lightheaded, falls; unusually weak or tired) ?muscle cramps ?pain, redness, or irritation at site where injected ?pain, tingling, numbness in the hands or feet ?seizures ?trouble breathing ?unusual bruising or bleeding ?Side effects that usually do not require medical attention (report to your doctor or health care professional if they continue or are bothersome): ?diarrhea ?nausea ?stomach pain ?trouble sleeping ?vomiting ?This list may not describe all possible side effects. Call your doctor for medical advice about side effects. You may report side effects to FDA at 1-800-FDA-1088. ?Where should I keep my medication? ?This medicine is given in a hospital or clinic. It will not be stored at home. ?NOTE: This sheet is a summary. It may not cover all possible information. If you have questions about this medicine, talk to your doctor, pharmacist, or health care provider. ?? 2022 Elsevier/Gold Standard (2019-12-17 00:00:00) ?

## 2021-03-22 ENCOUNTER — Other Ambulatory Visit: Payer: Self-pay | Admitting: Internal Medicine

## 2021-03-22 DIAGNOSIS — C9000 Multiple myeloma not having achieved remission: Secondary | ICD-10-CM

## 2021-03-27 ENCOUNTER — Encounter: Payer: Self-pay | Admitting: Internal Medicine

## 2021-03-28 ENCOUNTER — Other Ambulatory Visit: Payer: Self-pay

## 2021-03-28 ENCOUNTER — Other Ambulatory Visit: Payer: Self-pay | Admitting: Internal Medicine

## 2021-03-28 ENCOUNTER — Inpatient Hospital Stay: Payer: 59

## 2021-03-28 ENCOUNTER — Inpatient Hospital Stay (HOSPITAL_BASED_OUTPATIENT_CLINIC_OR_DEPARTMENT_OTHER): Payer: 59

## 2021-03-28 VITALS — BP 126/48 | HR 64 | Temp 98.5°F | Resp 17

## 2021-03-28 DIAGNOSIS — C9000 Multiple myeloma not having achieved remission: Secondary | ICD-10-CM

## 2021-03-28 DIAGNOSIS — Z5112 Encounter for antineoplastic immunotherapy: Secondary | ICD-10-CM | POA: Diagnosis not present

## 2021-03-28 LAB — CMP (CANCER CENTER ONLY)
ALT: 37 U/L (ref 0–44)
AST: 36 U/L (ref 15–41)
Albumin: 4 g/dL (ref 3.5–5.0)
Alkaline Phosphatase: 154 U/L — ABNORMAL HIGH (ref 38–126)
Anion gap: 9 (ref 5–15)
BUN: 9 mg/dL (ref 8–23)
CO2: 24 mmol/L (ref 22–32)
Calcium: 8.6 mg/dL — ABNORMAL LOW (ref 8.9–10.3)
Chloride: 102 mmol/L (ref 98–111)
Creatinine: 0.71 mg/dL (ref 0.44–1.00)
GFR, Estimated: >60 mL/min (ref >60)
Glucose, Bld: 198 mg/dL — ABNORMAL HIGH (ref 70–99)
Potassium: 3.5 mmol/L (ref 3.5–5.1)
Sodium: 135 mmol/L (ref 135–145)
Total Bilirubin: 0.9 mg/dL (ref 0.3–1.2)
Total Protein: 6.7 g/dL (ref 6.5–8.1)

## 2021-03-28 LAB — CBC WITH DIFFERENTIAL (CANCER CENTER ONLY)
Abs Immature Granulocytes: 0.02 10*3/uL (ref 0.00–0.07)
Basophils Absolute: 0 10*3/uL (ref 0.0–0.1)
Basophils Relative: 1 %
Eosinophils Absolute: 0.1 10*3/uL (ref 0.0–0.5)
Eosinophils Relative: 4 %
HCT: 33.5 % — ABNORMAL LOW (ref 36.0–46.0)
Hemoglobin: 11 g/dL — ABNORMAL LOW (ref 12.0–15.0)
Immature Granulocytes: 1 %
Lymphocytes Relative: 18 %
Lymphs Abs: 0.5 10*3/uL — ABNORMAL LOW (ref 0.7–4.0)
MCH: 29.9 pg (ref 26.0–34.0)
MCHC: 32.8 g/dL (ref 30.0–36.0)
MCV: 91 fL (ref 80.0–100.0)
Monocytes Absolute: 0.3 10*3/uL (ref 0.1–1.0)
Monocytes Relative: 11 %
Neutro Abs: 2 10*3/uL (ref 1.7–7.7)
Neutrophils Relative %: 65 %
Platelet Count: 97 10*3/uL — ABNORMAL LOW (ref 150–400)
RBC: 3.68 MIL/uL — ABNORMAL LOW (ref 3.87–5.11)
RDW: 16.2 % — ABNORMAL HIGH (ref 11.5–15.5)
WBC Count: 3 10*3/uL — ABNORMAL LOW (ref 4.0–10.5)
nRBC: 0 % (ref 0.0–0.2)

## 2021-03-28 MED ORDER — BORTEZOMIB CHEMO SQ INJECTION 3.5 MG (2.5MG/ML)
1.3000 mg/m2 | Freq: Once | INTRAMUSCULAR | Status: AC
  Administered 2021-03-28: 3 mg via SUBCUTANEOUS
  Filled 2021-03-28: qty 1.2

## 2021-03-28 MED ORDER — ONDANSETRON HCL 8 MG PO TABS
8.0000 mg | ORAL_TABLET | Freq: Once | ORAL | Status: AC
  Administered 2021-03-28: 8 mg via ORAL
  Filled 2021-03-28: qty 1

## 2021-03-28 NOTE — Patient Instructions (Signed)
[facility]  Discharge Instructions: Thank you for choosing [facility] to provide your oncology and hematology care.   If you have a lab appointment with the Cancer Center, please go directly to the Cancer Center and check in at the registration area.   Wear comfortable clothing and clothing appropriate for easy access to any Portacath or PICC line.   We strive to give you quality time with your provider. You may need to reschedule your appointment if you arrive late (15 or more minutes).  Arriving late affects you and other patients whose appointments are after yours.  Also, if you miss three or more appointments without notifying the office, you may be dismissed from the clinic at the provider's discretion.      For prescription refill requests, have your pharmacy contact our office and allow 72 hours for refills to be completed.    Today you received the following chemotherapy and/or immunotherapy agent: Velcade      To help prevent nausea and vomiting after your treatment, we encourage you to take your nausea medication as directed.  BELOW ARE SYMPTOMS THAT SHOULD BE REPORTED IMMEDIATELY: *FEVER GREATER THAN 100.4 F (38 C) OR HIGHER *CHILLS OR SWEATING *NAUSEA AND VOMITING THAT IS NOT CONTROLLED WITH YOUR NAUSEA MEDICATION *UNUSUAL SHORTNESS OF BREATH *UNUSUAL BRUISING OR BLEEDING *URINARY PROBLEMS (pain or burning when urinating, or frequent urination) *BOWEL PROBLEMS (unusual diarrhea, constipation, pain near the anus) TENDERNESS IN MOUTH AND THROAT WITH OR WITHOUT PRESENCE OF ULCERS (sore throat, sores in mouth, or a toothache) UNUSUAL RASH, SWELLING OR PAIN  UNUSUAL VAGINAL DISCHARGE OR ITCHING   Items with * indicate a potential emergency and should be followed up as soon as possible or go to the Emergency Department if any problems should occur.  Please show the CHEMOTHERAPY ALERT CARD or IMMUNOTHERAPY ALERT CARD at check-in to the  Emergency Department and triage nurse.  Should you have questions after your visit or need to cancel or reschedule your appointment, please contact [facility]  Dept: [phone]  and follow the prompts.  Office hours are 8:00 a.m. to 4:30 p.m. Monday - Friday. Please note that voicemails left after 4:00 p.m. may not be returned until the following business day.  We are closed weekends and major holidays. You have access to a nurse at all times for urgent questions. Please call the main number to the clinic Dept: [phone] and follow the prompts.   For any non-urgent questions, you may also contact your provider using MyChart. We now offer e-Visits for anyone 18 and older to request care online for non-urgent symptoms. For details visit [email or web address].   Also download the MyChart app! Go to the app store, search "MyChart", open the app, select [facility], and log in with your MyChart username and password.  Due to Covid, a mask is required upon entering the hospital/clinic. If you do not have a mask, one will be given to you upon arrival. For doctor visits, patients may have 1 support person aged 18 or older with them. For treatment visits, patients cannot have anyone with them due to current Covid guidelines and our immunocompromised population.  

## 2021-03-28 NOTE — Progress Notes (Signed)
Okay to treat with plt 97 per Dr. Julien Nordmann ?

## 2021-03-29 ENCOUNTER — Encounter: Payer: Self-pay | Admitting: Internal Medicine

## 2021-03-29 MED ORDER — REVLIMID 25 MG PO CAPS: ORAL_CAPSULE | ORAL | 1 refills | Status: DC

## 2021-03-29 NOTE — Addendum Note (Signed)
Addended by: Ardeen Garland on: 03/29/2021 01:15 PM ? ? Modules accepted: Orders ? ?

## 2021-04-04 ENCOUNTER — Other Ambulatory Visit: Payer: 59

## 2021-04-04 ENCOUNTER — Ambulatory Visit: Payer: 59

## 2021-04-04 ENCOUNTER — Other Ambulatory Visit: Payer: Self-pay | Admitting: Physician Assistant

## 2021-04-04 DIAGNOSIS — C9000 Multiple myeloma not having achieved remission: Secondary | ICD-10-CM

## 2021-04-05 ENCOUNTER — Other Ambulatory Visit: Payer: Self-pay

## 2021-04-05 ENCOUNTER — Inpatient Hospital Stay: Payer: 59

## 2021-04-05 VITALS — BP 139/68 | HR 60 | Temp 97.5°F | Resp 18 | Wt 250.8 lb

## 2021-04-05 DIAGNOSIS — C9000 Multiple myeloma not having achieved remission: Secondary | ICD-10-CM

## 2021-04-05 DIAGNOSIS — Z5112 Encounter for antineoplastic immunotherapy: Secondary | ICD-10-CM | POA: Diagnosis not present

## 2021-04-05 LAB — CBC WITH DIFFERENTIAL (CANCER CENTER ONLY)
Abs Immature Granulocytes: 0.01 10*3/uL (ref 0.00–0.07)
Basophils Absolute: 0.1 10*3/uL (ref 0.0–0.1)
Basophils Relative: 2 %
Eosinophils Absolute: 0 10*3/uL (ref 0.0–0.5)
Eosinophils Relative: 1 %
HCT: 32 % — ABNORMAL LOW (ref 36.0–46.0)
Hemoglobin: 10.8 g/dL — ABNORMAL LOW (ref 12.0–15.0)
Immature Granulocytes: 0 %
Lymphocytes Relative: 25 %
Lymphs Abs: 0.8 10*3/uL (ref 0.7–4.0)
MCH: 30.9 pg (ref 26.0–34.0)
MCHC: 33.8 g/dL (ref 30.0–36.0)
MCV: 91.4 fL (ref 80.0–100.0)
Monocytes Absolute: 0.4 10*3/uL (ref 0.1–1.0)
Monocytes Relative: 13 %
Neutro Abs: 1.8 10*3/uL (ref 1.7–7.7)
Neutrophils Relative %: 59 %
Platelet Count: 119 10*3/uL — ABNORMAL LOW (ref 150–400)
RBC: 3.5 MIL/uL — ABNORMAL LOW (ref 3.87–5.11)
RDW: 16.5 % — ABNORMAL HIGH (ref 11.5–15.5)
WBC Count: 3.1 10*3/uL — ABNORMAL LOW (ref 4.0–10.5)
nRBC: 0 % (ref 0.0–0.2)

## 2021-04-05 LAB — CMP (CANCER CENTER ONLY)
ALT: 26 U/L (ref 0–44)
AST: 37 U/L (ref 15–41)
Albumin: 3.9 g/dL (ref 3.5–5.0)
Alkaline Phosphatase: 140 U/L — ABNORMAL HIGH (ref 38–126)
Anion gap: 7 (ref 5–15)
BUN: 10 mg/dL (ref 8–23)
CO2: 23 mmol/L (ref 22–32)
Calcium: 8.9 mg/dL (ref 8.9–10.3)
Chloride: 106 mmol/L (ref 98–111)
Creatinine: 0.69 mg/dL (ref 0.44–1.00)
GFR, Estimated: >60 mL/min (ref >60)
Glucose, Bld: 128 mg/dL — ABNORMAL HIGH (ref 70–99)
Potassium: 3.8 mmol/L (ref 3.5–5.1)
Sodium: 136 mmol/L (ref 135–145)
Total Bilirubin: 0.7 mg/dL (ref 0.3–1.2)
Total Protein: 6.7 g/dL (ref 6.5–8.1)

## 2021-04-05 MED ORDER — BORTEZOMIB CHEMO SQ INJECTION 3.5 MG (2.5MG/ML)
1.3000 mg/m2 | Freq: Once | INTRAMUSCULAR | Status: AC
  Administered 2021-04-05: 3 mg via SUBCUTANEOUS
  Filled 2021-04-05: qty 1.2

## 2021-04-05 MED ORDER — ONDANSETRON HCL 8 MG PO TABS
8.0000 mg | ORAL_TABLET | Freq: Once | ORAL | Status: AC
  Administered 2021-04-05: 8 mg via ORAL
  Filled 2021-04-05: qty 1

## 2021-04-05 NOTE — Patient Instructions (Signed)
[facility]  Discharge Instructions: Thank you for choosing [facility] to provide your oncology and hematology care.   If you have a lab appointment with the Cancer Center, please go directly to the Cancer Center and check in at the registration area.   Wear comfortable clothing and clothing appropriate for easy access to any Portacath or PICC line.   We strive to give you quality time with your provider. You may need to reschedule your appointment if you arrive late (15 or more minutes).  Arriving late affects you and other patients whose appointments are after yours.  Also, if you miss three or more appointments without notifying the office, you may be dismissed from the clinic at the provider's discretion.      For prescription refill requests, have your pharmacy contact our office and allow 72 hours for refills to be completed.    Today you received the following chemotherapy and/or immunotherapy agents Velcade       To help prevent nausea and vomiting after your treatment, we encourage you to take your nausea medication as directed.  BELOW ARE SYMPTOMS THAT SHOULD BE REPORTED IMMEDIATELY: *FEVER GREATER THAN 100.4 F (38 C) OR HIGHER *CHILLS OR SWEATING *NAUSEA AND VOMITING THAT IS NOT CONTROLLED WITH YOUR NAUSEA MEDICATION *UNUSUAL SHORTNESS OF BREATH *UNUSUAL BRUISING OR BLEEDING *URINARY PROBLEMS (pain or burning when urinating, or frequent urination) *BOWEL PROBLEMS (unusual diarrhea, constipation, pain near the anus) TENDERNESS IN MOUTH AND THROAT WITH OR WITHOUT PRESENCE OF ULCERS (sore throat, sores in mouth, or a toothache) UNUSUAL RASH, SWELLING OR PAIN  UNUSUAL VAGINAL DISCHARGE OR ITCHING   Items with * indicate a potential emergency and should be followed up as soon as possible or go to the Emergency Department if any problems should occur.  Please show the CHEMOTHERAPY ALERT CARD or IMMUNOTHERAPY ALERT CARD at check-in to  the Emergency Department and triage nurse.  Should you have questions after your visit or need to cancel or reschedule your appointment, please contact [facility]  Dept: [phone]  and follow the prompts.  Office hours are 8:00 a.m. to 4:30 p.m. Monday - Friday. Please note that voicemails left after 4:00 p.m. may not be returned until the following business day.  We are closed weekends and major holidays. You have access to a nurse at all times for urgent questions. Please call the main number to the clinic Dept: [phone] and follow the prompts.   For any non-urgent questions, you may also contact your provider using MyChart. We now offer e-Visits for anyone 18 and older to request care online for non-urgent symptoms. For details visit [email or web address].   Also download the MyChart app! Go to the app store, search "MyChart", open the app, select [facility], and log in with your MyChart username and password.  Due to Covid, a mask is required upon entering the hospital/clinic. If you do not have a mask, one will be given to you upon arrival. For doctor visits, patients may have 1 support person aged 18 or older with them. For treatment visits, patients cannot have anyone with them due to current Covid guidelines and our immunocompromised population.   

## 2021-04-11 ENCOUNTER — Inpatient Hospital Stay: Payer: 59

## 2021-04-11 ENCOUNTER — Other Ambulatory Visit: Payer: Self-pay

## 2021-04-11 ENCOUNTER — Inpatient Hospital Stay: Payer: 59 | Attending: Physician Assistant

## 2021-04-11 ENCOUNTER — Inpatient Hospital Stay: Payer: 59 | Admitting: Internal Medicine

## 2021-04-11 VITALS — BP 137/61 | HR 78 | Temp 99.2°F | Resp 17 | Wt 250.6 lb

## 2021-04-11 DIAGNOSIS — D509 Iron deficiency anemia, unspecified: Secondary | ICD-10-CM | POA: Insufficient documentation

## 2021-04-11 DIAGNOSIS — Z885 Allergy status to narcotic agent status: Secondary | ICD-10-CM | POA: Insufficient documentation

## 2021-04-11 DIAGNOSIS — Z7961 Long term (current) use of immunomodulator: Secondary | ICD-10-CM | POA: Diagnosis not present

## 2021-04-11 DIAGNOSIS — Z7969 Long term (current) use of other immunomodulators and immunosuppressants: Secondary | ICD-10-CM | POA: Insufficient documentation

## 2021-04-11 DIAGNOSIS — R059 Cough, unspecified: Secondary | ICD-10-CM | POA: Insufficient documentation

## 2021-04-11 DIAGNOSIS — C9000 Multiple myeloma not having achieved remission: Secondary | ICD-10-CM

## 2021-04-11 DIAGNOSIS — Z880 Allergy status to penicillin: Secondary | ICD-10-CM | POA: Diagnosis not present

## 2021-04-11 DIAGNOSIS — R0981 Nasal congestion: Secondary | ICD-10-CM | POA: Diagnosis not present

## 2021-04-11 DIAGNOSIS — Z79899 Other long term (current) drug therapy: Secondary | ICD-10-CM | POA: Diagnosis not present

## 2021-04-11 DIAGNOSIS — Z9049 Acquired absence of other specified parts of digestive tract: Secondary | ICD-10-CM | POA: Insufficient documentation

## 2021-04-11 DIAGNOSIS — C9030 Solitary plasmacytoma not having achieved remission: Secondary | ICD-10-CM | POA: Diagnosis present

## 2021-04-11 DIAGNOSIS — Z5112 Encounter for antineoplastic immunotherapy: Secondary | ICD-10-CM | POA: Insufficient documentation

## 2021-04-11 DIAGNOSIS — R5383 Other fatigue: Secondary | ICD-10-CM | POA: Insufficient documentation

## 2021-04-11 DIAGNOSIS — Z7952 Long term (current) use of systemic steroids: Secondary | ICD-10-CM | POA: Diagnosis not present

## 2021-04-11 DIAGNOSIS — Z5111 Encounter for antineoplastic chemotherapy: Secondary | ICD-10-CM

## 2021-04-11 LAB — CMP (CANCER CENTER ONLY)
ALT: 27 U/L (ref 0–44)
AST: 38 U/L (ref 15–41)
Albumin: 3.8 g/dL (ref 3.5–5.0)
Alkaline Phosphatase: 127 U/L — ABNORMAL HIGH (ref 38–126)
Anion gap: 11 (ref 5–15)
BUN: 6 mg/dL — ABNORMAL LOW (ref 8–23)
CO2: 23 mmol/L (ref 22–32)
Calcium: 8.5 mg/dL — ABNORMAL LOW (ref 8.9–10.3)
Chloride: 102 mmol/L (ref 98–111)
Creatinine: 0.78 mg/dL (ref 0.44–1.00)
GFR, Estimated: >60 mL/min (ref >60)
Glucose, Bld: 205 mg/dL — ABNORMAL HIGH (ref 70–99)
Potassium: 3.5 mmol/L (ref 3.5–5.1)
Sodium: 136 mmol/L (ref 135–145)
Total Bilirubin: 1 mg/dL (ref 0.3–1.2)
Total Protein: 6.6 g/dL (ref 6.5–8.1)

## 2021-04-11 LAB — CBC WITH DIFFERENTIAL (CANCER CENTER ONLY)
Abs Immature Granulocytes: 0.01 10*3/uL (ref 0.00–0.07)
Basophils Absolute: 0 10*3/uL (ref 0.0–0.1)
Basophils Relative: 1 %
Eosinophils Absolute: 0 10*3/uL (ref 0.0–0.5)
Eosinophils Relative: 1 %
HCT: 31.3 % — ABNORMAL LOW (ref 36.0–46.0)
Hemoglobin: 10.3 g/dL — ABNORMAL LOW (ref 12.0–15.0)
Immature Granulocytes: 1 %
Lymphocytes Relative: 15 %
Lymphs Abs: 0.3 10*3/uL — ABNORMAL LOW (ref 0.7–4.0)
MCH: 29.9 pg (ref 26.0–34.0)
MCHC: 32.9 g/dL (ref 30.0–36.0)
MCV: 90.7 fL (ref 80.0–100.0)
Monocytes Absolute: 0.1 10*3/uL (ref 0.1–1.0)
Monocytes Relative: 7 %
Neutro Abs: 1.7 10*3/uL (ref 1.7–7.7)
Neutrophils Relative %: 75 %
Platelet Count: 88 10*3/uL — ABNORMAL LOW (ref 150–400)
RBC: 3.45 MIL/uL — ABNORMAL LOW (ref 3.87–5.11)
RDW: 16.2 % — ABNORMAL HIGH (ref 11.5–15.5)
WBC Count: 2.2 10*3/uL — ABNORMAL LOW (ref 4.0–10.5)
nRBC: 0 % (ref 0.0–0.2)

## 2021-04-11 MED ORDER — AZITHROMYCIN 250 MG PO TABS: ORAL_TABLET | ORAL | 0 refills | Status: DC

## 2021-04-11 NOTE — Progress Notes (Signed)
?  ?    [facility] ?[phone]   [phone] ? ?OFFICE PROGRESS NOTE ? ?[name], MD ?[place] ?[place] [address] ? 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  ? ?PRIOR THERAPY:  ?1) Velcade 1.3 MG/M2 subcutaneously with Decadron 40 mg by mouth on a weekly basis. First cycle 11/24/2013. She status post 31 weekly doses of treatment. ?2) Velcade 1.3 MG/M2 subcutaneously and weekly basis with Decadron 40 mg by mouth weekly. First dose 02/01/2015. Status post 28 cycles. ?3) Revlimid 25 mg by mouth daily for 21 days every 4 weeks with weekly Decadron 20 mg. started in 11/27/2015. Status post 3 cycles discontinued secondary to lack of response. ? ?CURRENT THERAPY: Systemic treatment with Velcade 1.3 MG/KG weekly, Revlimid 25 mg by mouth daily for 21 days every 4 weeks in addition to Decadron 20 mg by mouth weekly. First dose 03/06/2016. Status post 41 cycles.  She has a break off treatment from June 2021 until July 2022. ? ?INTERVAL HISTORY: ?Brenda Conner 61 y.o. female returns to the clinic today for follow-up visit.  The patient is feeling fine today with no concerning complaints except for recent sinus infection and drainage.  She also complaining of fatigue.  She denied having any current chest pain, shortness of breath but has mild cough with no hemoptysis.  She has no nausea, vomiting, diarrhea or constipation.  She has no headache or visual changes.  She has no recent weight loss or night sweats.  She has been tolerating her treatment with Revlimid and Velcade fairly well.  She is here today for evaluation before starting cycle #42. ? ?MEDICAL HISTORY: ?Past Medical History:  ?Diagnosis Date  ? Acid reflux   ? Anxiety   ? Asthma   ? Cancer [facility])   ? waldenstroms/ macroglobinulemia  ? Depression   ? Depression   ? Diabetes mellitus without complication ([facility])    ? Dysuria 02/28/2016  ? Hypercholesteremia   ? Hypertension   ? Hypothyroidism   ? Macroglobulinemia ([facility])   ? ? POEMS syndrome  ? Multiple myeloma ([facility])   ? Obesity   ? PONV (postoperative nausea and vomiting)   ? Sleep apnea   ? CPAP at bedtime  ? Sleep apnea   ? ? ?ALLERGIES:  is allergic to codeine, hydrocodone, lortab [hydrocodone-acetaminophen], onion, shellfish allergy, and amoxicillin. ? ?MEDICATIONS:  ?Current Outpatient Medications  ?Medication Sig Dispense Refill  ? ACCU-CHEK AVIVA PLUS test strip USE TO CHECK SUGAR TWICE A DAY 90    ? acyclovir (ZOVIRAX) 400 MG tablet Take 1 tablet (400 mg total) by mouth 2 (two) times daily. 180 tablet 1  ? ALPRAZolam (XANAX) 0.5 MG tablet Take 1 tablet (0.5 mg total) by mouth at bedtime as needed for anxiety. 30 tablet 1  ? aspirin 81 MG chewable tablet Chew by mouth daily.    ? Azelastine HCl 0.15 % SOLN as needed.    ? B-D UF III MINI PEN NEEDLES 31G X 5 MM MISC     ? Blood Glucose Monitoring Suppl (ONE TOUCH ULTRA MINI) W/DEVICE KIT See admin instructions. Reported on 01/25/2015  0  ? bortezomib IV (VELCADE) 3.5 MG injection Inject 1.3 mg/m2 into the vein once.    ? Cetirizine HCl (ZYRTEC ALLERGY PO) Take by mouth daily.    ? dexamethasone (DECADRON) 4 MG tablet 5 tablets p.o. weekly on the day  of chemotherapy. 60 tablet 2  ? DiphenhydrAMINE HCl (BENADRYL ALLERGY PO) Take by mouth as needed.    ? EPIPEN 2-PAK 0.3 MG/0.3ML SOAJ injection Reported on 06/21/2015 (Patient not taking: Reported on 12/20/2020)  1  ? Fexofenadine HCl (ALLEGRA ALLERGY PO) Take by mouth daily.    ? Fluticasone-Salmeterol (ADVAIR) 100-50 MCG/DOSE AEPB Inhale 2 puffs into the lungs every 12 (twelve) hours.    ? ibuprofen (ADVIL,MOTRIN) 100 MG tablet Take 100 mg by mouth every 6 (six) hours as needed. Reported on 05/31/2015 (Patient not taking: Reported on 12/20/2020)    ? Insulin Glargine (BASAGLAR KWIKPEN) 100 UNIT/ML Inject 20 Units into the skin at bedtime.    ? levothyroxine (SYNTHROID,  LEVOTHROID) 150 MCG tablet Take 150 mcg by mouth daily before breakfast.    ? lisinopril (PRINIVIL,ZESTRIL) 10 MG tablet Take 10 mg by mouth daily. auth number 05/29/2018 [id number]  3  ? loperamide (IMODIUM) 2 MG capsule Take 2 mg by mouth as needed for diarrhea or loose stools. (Patient not taking: Reported on 12/20/2020)    ? magic mouthwash w/lidocaine SOLN Take 5 mLs by mouth 4 (four) times daily as needed for mouth pain. Swish, Gargle, and spit 240 mL 1  ? metFORMIN (GLUCOPHAGE-XR) 500 MG 24 hr tablet Take 500 mg by mouth daily.    ? NOVOLOG FLEXPEN 100 UNIT/ML FlexPen Inject 15 Units into the skin in the morning, at noon, and at bedtime. Per Sliding Scale    ? omeprazole (PRILOSEC) 40 MG capsule Take 40 mg by mouth 2 (two) times daily.     ? ondansetron (ZOFRAN-ODT) 8 MG disintegrating tablet Take 1 tablet (8 mg total) by mouth every 8 (eight) hours as needed. Reported on 05/31/2015 (Patient not taking: Reported on 12/20/2020) 30 tablet 2  ? pravastatin (PRAVACHOL) 40 MG tablet Take 40 mg by mouth every evening.     ? PROAIR HFA 108 (90 Base) MCG/ACT inhaler Reported on 06/21/2015  1  ? REVLIMID 25 MG capsule [employer] Auth # [id number]     Date Obtained 03/29/21 ? ?Adult female NOT of childbearing potential 21 capsule 1  ? sertraline (ZOLOFT) 50 MG tablet Take 3 tablets (150 mg total) by mouth daily. 270 tablet 3  ? verapamil (CALAN-SR) 240 MG CR tablet Take 240 mg by mouth 2 (two) times daily.    ? ?No current facility-administered medications for this visit.  ? ? ?SURGICAL HISTORY:  ?Past Surgical History:  ?Procedure Laterality Date  ? ABLATION  09/09/2007  ? HTA and polyp resection  ? BACK SURGERY  03/08/2004  ? herniation, L4-L5  ? Bil Laprascopic knee surgery    ? BONE MARROW BIOPSY    ? 2011  ? BONE MARROW BIOPSY  04/08/2012  ? BONE MARROW BIOPSY  01/2020  ? CHOLECYSTECTOMY    ? FOOT SURGERY  01/09/1996  ? KNEE ARTHROSCOPY W/ MENISCAL REPAIR  10/10 ; 3/11  ? LAPAROSCOPIC CHOLECYSTECTOMY  01/09/1995  ? NASAL  SINUS SURGERY  01/08/2002  ? UTERINE FIBROID EMBOLIZATION    ? ? ?REVIEW OF SYSTEMS:  A comprehensive review of systems was negative except for: Constitutional: positive for fatigue ?Ears, nose, mouth, throat, and face: positive for nasal congestion and sinus drainage ?Respiratory: positive for cough  ? ?PHYSICAL EXAMINATION: General appearance: alert, cooperative, fatigued, and no distress ?Head: Normocephalic, without obvious abnormality, atraumatic ?Neck: no adenopathy ?Lymph nodes: Cervical, supraclavicular, and axillary nodes normal. ?Resp: clear to auscultation bilaterally ?Back: symmetric, no curvature. ROM normal. No CVA tenderness. ?Cardio:  regular rate and rhythm, S1, S2 normal, no murmur, click, rub or gallop ?GI: soft, non-tender; bowel sounds normal; no masses,  no organomegaly ?Extremities: extremities normal, atraumatic, no cyanosis or edema ? ?ECOG PERFORMANCE STATUS: 1 - Symptomatic but completely ambulatory ? ?Blood pressure 137/61, pulse 78, temperature 99.2 ?F (37.3 ?C), temperature source Tympanic, resp. rate 17, weight 250 lb 9 oz (113.7 kg), SpO2 99 %. ? ?LABORATORY DATA: ?Lab Results  ?Component Value Date  ? WBC 2.2 (L) 04/11/2021  ? HGB 10.3 (L) 04/11/2021  ? HCT 31.3 (L) 04/11/2021  ? MCV 90.7 04/11/2021  ? PLT 88 (L) 04/11/2021  ? ? ?  Chemistry   ?   ?Component Value Date/Time  ? NA 136 04/05/2021 1302  ? NA 138 11/14/2016 0824  ? K 3.8 04/05/2021 1302  ? K 4.3 11/14/2016 0824  ? CL 106 04/05/2021 1302  ? CL 104 04/07/2012 1415  ? CO2 23 04/05/2021 1302  ? CO2 23 11/14/2016 0824  ? BUN 10 04/05/2021 1302  ? BUN 9.1 11/14/2016 0824  ? CREATININE 0.69 04/05/2021 1302  ? CREATININE 0.7 11/14/2016 0824  ?    ?Component Value Date/Time  ? CALCIUM 8.9 04/05/2021 1302  ? CALCIUM 9.6 11/14/2016 0824  ? ALKPHOS 140 (H) 04/05/2021 1302  ? ALKPHOS 94 11/14/2016 0824  ? AST 37 04/05/2021 1302  ? AST 28 11/14/2016 0824  ? ALT 26 04/05/2021 1302  ? ALT 24 11/14/2016 0824  ? BILITOT 0.7 04/05/2021 1302   ? BILITOT 0.41 11/14/2016 0824  ?  ? ? 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  ?The patient has bee

## 2021-04-15 ENCOUNTER — Telehealth: Payer: 59 | Admitting: Nurse Practitioner

## 2021-04-15 DIAGNOSIS — U071 COVID-19: Secondary | ICD-10-CM | POA: Diagnosis not present

## 2021-04-15 MED ORDER — MOLNUPIRAVIR EUA 200MG CAPSULE: 4.0000 | ORAL_CAPSULE | Freq: Two times a day (BID) | ORAL | 0 refills | Status: AC

## 2021-04-15 NOTE — Patient Instructions (Signed)
?Brenda Conner, thank you for joining Gildardo Pounds, NP for today's virtual visit.  While this provider is not your primary care provider (PCP), if your PCP is located in our provider database this encounter information will be shared with them immediately following your visit. ? ?Consent: ?(Patient) Brenda Conner provided verbal consent for this virtual visit at the beginning of the encounter. ? ?Current Medications: ? ?Current Outpatient Medications:  ?  molnupiravir EUA (LAGEVRIO) 200 mg CAPS capsule, Take 4 capsules (800 mg total) by mouth 2 (two) times daily for 5 days., Disp: 40 capsule, Rfl: 0 ?  ACCU-CHEK AVIVA PLUS test strip, USE TO CHECK SUGAR TWICE A DAY 90, Disp: , Rfl:  ?  acyclovir (ZOVIRAX) 400 MG tablet, Take 1 tablet (400 mg total) by mouth 2 (two) times daily., Disp: 180 tablet, Rfl: 1 ?  ALPRAZolam (XANAX) 0.5 MG tablet, Take 1 tablet (0.5 mg total) by mouth at bedtime as needed for anxiety., Disp: 30 tablet, Rfl: 1 ?  aspirin 81 MG chewable tablet, Chew by mouth daily., Disp: , Rfl:  ?  Azelastine HCl 0.15 % SOLN, as needed., Disp: , Rfl:  ?  azithromycin (ZITHROMAX) 250 MG tablet, Use as instructed, Disp: 6 each, Rfl: 0 ?  B-D UF III MINI PEN NEEDLES 31G X 5 MM MISC, , Disp: , Rfl:  ?  Blood Glucose Monitoring Suppl (ONE TOUCH ULTRA MINI) W/DEVICE KIT, See admin instructions. Reported on 01/25/2015, Disp: , Rfl: 0 ?  bortezomib IV (VELCADE) 3.5 MG injection, Inject 1.3 mg/m2 into the vein once., Disp: , Rfl:  ?  Cetirizine HCl (ZYRTEC ALLERGY PO), Take by mouth daily., Disp: , Rfl:  ?  dexamethasone (DECADRON) 4 MG tablet, 5 tablets p.o. weekly on the day of chemotherapy., Disp: 60 tablet, Rfl: 2 ?  DiphenhydrAMINE HCl (BENADRYL ALLERGY PO), Take by mouth as needed., Disp: , Rfl:  ?  EPIPEN 2-PAK 0.3 MG/0.3ML SOAJ injection, Reported on 06/21/2015 (Patient not taking: Reported on 12/20/2020), Disp: , Rfl: 1 ?  Fexofenadine HCl (ALLEGRA ALLERGY PO), Take by mouth daily., Disp: , Rfl:  ?   Fluticasone-Salmeterol (ADVAIR) 100-50 MCG/DOSE AEPB, Inhale 2 puffs into the lungs every 12 (twelve) hours., Disp: , Rfl:  ?  ibuprofen (ADVIL,MOTRIN) 100 MG tablet, Take 100 mg by mouth every 6 (six) hours as needed. Reported on 05/31/2015 (Patient not taking: Reported on 12/20/2020), Disp: , Rfl:  ?  Insulin Glargine (BASAGLAR KWIKPEN) 100 UNIT/ML, Inject 20 Units into the skin at bedtime., Disp: , Rfl:  ?  levothyroxine (SYNTHROID, LEVOTHROID) 150 MCG tablet, Take 150 mcg by mouth daily before breakfast., Disp: , Rfl:  ?  lisinopril (PRINIVIL,ZESTRIL) 10 MG tablet, Take 10 mg by mouth daily. auth number 05/29/2018 [id number], Disp: , Rfl: 3 ?  loperamide (IMODIUM) 2 MG capsule, Take 2 mg by mouth as needed for diarrhea or loose stools. (Patient not taking: Reported on 12/20/2020), Disp: , Rfl:  ?  magic mouthwash w/lidocaine SOLN, Take 5 mLs by mouth 4 (four) times daily as needed for mouth pain. Swish, Gargle, and spit, Disp: 240 mL, Rfl: 1 ?  metFORMIN (GLUCOPHAGE-XR) 500 MG 24 hr tablet, Take 500 mg by mouth daily., Disp: , Rfl:  ?  NOVOLOG FLEXPEN 100 UNIT/ML FlexPen, Inject 15 Units into the skin in the morning, at noon, and at bedtime. Per Sliding Scale, Disp: , Rfl:  ?  omeprazole (PRILOSEC) 40 MG capsule, Take 40 mg by mouth 2 (two) times daily. , Disp: ,  Rfl:  ?  ondansetron (ZOFRAN-ODT) 8 MG disintegrating tablet, Take 1 tablet (8 mg total) by mouth every 8 (eight) hours as needed. Reported on 05/31/2015 (Patient not taking: Reported on 12/20/2020), Disp: 30 tablet, Rfl: 2 ?  pravastatin (PRAVACHOL) 40 MG tablet, Take 40 mg by mouth every evening. , Disp: , Rfl:  ?  PROAIR HFA 108 (90 Base) MCG/ACT inhaler, Reported on 06/21/2015, Disp: , Rfl: 1 ?  REVLIMID 25 MG capsule, [employer] Auth # [id number]     Date Obtained 03/29/21  Adult female NOT of childbearing potential, Disp: 21 capsule, Rfl: 1 ?  sertraline (ZOLOFT) 50 MG tablet, Take 3 tablets (150 mg total) by mouth daily., Disp: 270 tablet, Rfl: 3 ?  verapamil  (CALAN-SR) 240 MG CR tablet, Take 240 mg by mouth 2 (two) times daily., Disp: , Rfl:   ? ?Medications ordered in this encounter:  ?Meds ordered this encounter  ?Medications  ? molnupiravir EUA (LAGEVRIO) 200 mg CAPS capsule  ?  Sig: Take 4 capsules (800 mg total) by mouth 2 (two) times daily for 5 days.  ?  Dispense:  40 capsule  ?  Refill:  0  ?  Order Specific Question:   Supervising Provider  ?  Answer:   Noemi Chapel [[id number]]  ?  ? ?*If you need refills on other medications prior to your next appointment, please contact your pharmacy* ? ?Follow-Up: ?Call back or seek an in-person evaluation if the symptoms worsen or if the condition fails to improve as anticipated. ? ?Other Instructions ?Please keep well-hydrated and get plenty of rest. ?Start a saline nasal rinse to flush out your nasal passages. ?You can use plain Mucinex to help thin congestion. ?If you have a humidifier, running in the bedroom at night. ?I want you to start OTC vitamin D3 1000 units daily, vitamin C 1000 mg daily, and a zinc supplement. ?Please take prescribed medications as directed. ? ?  ?If PCR test positive, please see quarantine instructions below. I also want you to message me via MyChart, using the separate message I have sent you. This way you can communicate directly with me.  ?  ?You were to quarantine for 5 days from onset of your symptoms.  After day 5, if you have had no fever and you are feeling better, you can end quarantine but need to mask for an additional 5 days. ?After day 5 if you have a fever or are having significant symptoms, please quarantine for full 10 days. ?  ?If you note any worsening of symptoms, any significant shortness of breath or any chest pain, please seek ER evaluation ASAP.  Please do not delay care!  ? ? ?If you have been instructed to have an in-person evaluation today at a local Urgent Care facility, please use the link below. It will take you to a list of all of our available [facility], including address, phone number and hours of operation. Please do not delay care.  ?[facility] ? ?If you or a family member do not have a primary care provider, use the link below to schedule a visit and establish care. When you choose a [facility] primary care physician or advanced practice provider, you gain a long-term partner in health. ?Find a Primary Care Provider ? ?Learn more about [facility]'s in-office and virtual care options: ?[facility]  ?

## 2021-04-15 NOTE — Progress Notes (Signed)
?Virtual Visit Consent  ? ?Lorette Ang, you are scheduled for a virtual visit with a [facility] provider today.   ?  ?Just as with appointments in the office, your consent must be obtained to participate.  Your consent will be active for this visit and any virtual visit you may have with one of our providers in the next 365 days.   ?  ?If you have a MyChart account, a copy of this consent can be sent to you electronically.  All virtual visits are billed to your insurance company just like a traditional visit in the office.   ? ?As this is a virtual visit, video technology does not allow for your provider to perform a traditional examination.  This may limit your provider's ability to fully assess your condition.  If your provider identifies any concerns that need to be evaluated in person or the need to arrange testing (such as labs, EKG, etc.), we will make arrangements to do so.   ?  ?Although advances in technology are sophisticated, we cannot ensure that it will always work on either your end or our end.  If the connection with a video visit is poor, the visit may have to be switched to a telephone visit.  With either a video or telephone visit, we are not always able to ensure that we have a secure connection.    ? ?I need to obtain your verbal consent now.   Are you willing to proceed with your visit today?  ?  ?Brenda Conner has provided verbal consent on 04/15/2021 for a virtual visit (video or telephone). ?  ?Gildardo Pounds, NP  ? ?Date: 04/15/2021 11:21 AM ? ? ?Virtual Visit via Video Note  ? ?IGildardo Pounds, connected with  Brenda Conner  ([id number], [birth year]) on 04/15/21 at 11:15 AM EDT by a video-enabled telemedicine application and verified that I am speaking with the correct person using two identifiers. ? ?Location: ?Patient: Virtual Visit Location Patient: Home ?Provider: Virtual Visit Location Provider: Home Office ?  ?I discussed the limitations of evaluation and management by  telemedicine and the availability of in person appointments. The patient expressed understanding and agreed to proceed.   ? ?History of Present Illness: ?Brenda Conner is a 61 y.o. who identifies as a female who was assigned female at birth, and is being seen today for [place]. ? ?Started feeling bad on tuesday. COVID test was negative at that time. Symptoms started worsening and COVID test repeated last night and now positive. Current symptoms include nasal and chest congestion, loss of sense of smell, productive cough and fever which has now resolved.  ? ? ?Problems:  ?Patient Active Problem List  ? Diagnosis Date Noted  ? Deficiency anemia 03/31/2020  ? Hypertension 03/04/2018  ? Encounter for antineoplastic chemotherapy 09/13/2015  ? Multiple myeloma not having achieved remission ([place]) 03/29/2015  ? Multiple myeloma ([place]) 11/18/2013  ? 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 MGUS (monoclonal gammopathy of unknown significance) 10/03/2011  ?  ?Allergies:  ?Allergies  ?Allergen Reactions  ? Codeine Hives  ? Hydrocodone Nausea And Vomiting  ? Lortab [Hydrocodone-Acetaminophen] Nausea And Vomiting  ? Onion   ? Shellfish Allergy   ? Amoxicillin Rash  ? ?Medications:  ?Current Outpatient Medications:  ?  molnupiravir EUA (LAGEVRIO) 200 mg CAPS capsule, Take 4 capsules (800 mg total) by mouth 2 (two) times daily for 5 days., Disp: 40 capsule, Rfl: 0 ?  ACCU-CHEK AVIVA PLUS test  strip, USE TO CHECK SUGAR TWICE A DAY 90, Disp: , Rfl:  ?  acyclovir (ZOVIRAX) 400 MG tablet, Take 1 tablet (400 mg total) by mouth 2 (two) times daily., Disp: 180 tablet, Rfl: 1 ?  ALPRAZolam (XANAX) 0.5 MG tablet, Take 1 tablet (0.5 mg total) by mouth at bedtime as needed for anxiety., Disp: 30 tablet, Rfl: 1 ?  aspirin 81 MG chewable tablet, Chew by mouth daily., Disp: , Rfl:  ?  Azelastine HCl 0.15 % SOLN, as needed., Disp: , Rfl:  ?  azithromycin (ZITHROMAX) 250 MG tablet, Use as instructed, Disp: 6 each, Rfl: 0 ?  B-D UF III MINI PEN  NEEDLES 31G X 5 MM MISC, , Disp: , Rfl:  ?  Blood Glucose Monitoring Suppl (ONE TOUCH ULTRA MINI) W/DEVICE KIT, See admin instructions. Reported on 01/25/2015, Disp: , Rfl: 0 ?  bortezomib IV (VELCADE) 3.5 MG injection, Inject 1.3 mg/m2 into the vein once., Disp: , Rfl:  ?  Cetirizine HCl (ZYRTEC ALLERGY PO), Take by mouth daily., Disp: , Rfl:  ?  dexamethasone (DECADRON) 4 MG tablet, 5 tablets p.o. weekly on the day of chemotherapy., Disp: 60 tablet, Rfl: 2 ?  DiphenhydrAMINE HCl (BENADRYL ALLERGY PO), Take by mouth as needed., Disp: , Rfl:  ?  EPIPEN 2-PAK 0.3 MG/0.3ML SOAJ injection, Reported on 06/21/2015 (Patient not taking: Reported on 12/20/2020), Disp: , Rfl: 1 ?  Fexofenadine HCl (ALLEGRA ALLERGY PO), Take by mouth daily., Disp: , Rfl:  ?  Fluticasone-Salmeterol (ADVAIR) 100-50 MCG/DOSE AEPB, Inhale 2 puffs into the lungs every 12 (twelve) hours., Disp: , Rfl:  ?  ibuprofen (ADVIL,MOTRIN) 100 MG tablet, Take 100 mg by mouth every 6 (six) hours as needed. Reported on 05/31/2015 (Patient not taking: Reported on 12/20/2020), Disp: , Rfl:  ?  Insulin Glargine (BASAGLAR KWIKPEN) 100 UNIT/ML, Inject 20 Units into the skin at bedtime., Disp: , Rfl:  ?  levothyroxine (SYNTHROID, LEVOTHROID) 150 MCG tablet, Take 150 mcg by mouth daily before breakfast., Disp: , Rfl:  ?  lisinopril (PRINIVIL,ZESTRIL) 10 MG tablet, Take 10 mg by mouth daily. auth number 05/29/2018 [id number], Disp: , Rfl: 3 ?  loperamide (IMODIUM) 2 MG capsule, Take 2 mg by mouth as needed for diarrhea or loose stools. (Patient not taking: Reported on 12/20/2020), Disp: , Rfl:  ?  magic mouthwash w/lidocaine SOLN, Take 5 mLs by mouth 4 (four) times daily as needed for mouth pain. Swish, Gargle, and spit, Disp: 240 mL, Rfl: 1 ?  metFORMIN (GLUCOPHAGE-XR) 500 MG 24 hr tablet, Take 500 mg by mouth daily., Disp: , Rfl:  ?  NOVOLOG FLEXPEN 100 UNIT/ML FlexPen, Inject 15 Units into the skin in the morning, at noon, and at bedtime. Per Sliding Scale, Disp: , Rfl:   ?  omeprazole (PRILOSEC) 40 MG capsule, Take 40 mg by mouth 2 (two) times daily. , Disp: , Rfl:  ?  ondansetron (ZOFRAN-ODT) 8 MG disintegrating tablet, Take 1 tablet (8 mg total) by mouth every 8 (eight) hours as needed. Reported on 05/31/2015 (Patient not taking: Reported on 12/20/2020), Disp: 30 tablet, Rfl: 2 ?  pravastatin (PRAVACHOL) 40 MG tablet, Take 40 mg by mouth every evening. , Disp: , Rfl:  ?  PROAIR HFA 108 (90 Base) MCG/ACT inhaler, Reported on 06/21/2015, Disp: , Rfl: 1 ?  REVLIMID 25 MG capsule, [employer] Auth # [id number]     Date Obtained 03/29/21  Adult female NOT of childbearing potential, Disp: 21 capsule, Rfl: 1 ?  sertraline (ZOLOFT) 50 MG  tablet, Take 3 tablets (150 mg total) by mouth daily., Disp: 270 tablet, Rfl: 3 ?  verapamil (CALAN-SR) 240 MG CR tablet, Take 240 mg by mouth 2 (two) times daily., Disp: , Rfl:  ? ?Observations/Objective: ?Patient is well-developed, well-nourished in no acute distress.  ?Resting comfortably  at home.  ?Head is normocephalic, atraumatic.  ?No labored breathing.  ?Speech is clear and coherent with logical content.  ?Patient is alert and oriented at baseline.  ? ? ?Assessment and Plan: ?1. Positive self-administered antigen test for COVID-19 ?- molnupiravir EUA (LAGEVRIO) 200 mg CAPS capsule; Take 4 capsules (800 mg total) by mouth 2 (two) times daily for 5 days.  Dispense: 40 capsule; Refill: 0 ? ?INSTRUCTIONS: use a humidifier for nasal congestion ?Drink plenty of fluids, rest and wash hands frequently to avoid the spread of infection ?Alternate tylenol and Motrin for relief of fever  ? ?Follow Up Instructions: ?I discussed the assessment and treatment plan with the patient. The patient was provided an opportunity to ask questions and all were answered. The patient agreed with the plan and demonstrated an understanding of the instructions.  A copy of instructions were sent to the patient via MyChart unless otherwise noted below.  ? ? ?The patient was advised to  call back or seek an in-person evaluation if the symptoms worsen or if the condition fails to improve as anticipated. ? ?Time:  ?I spent 11 minutes with the patient via telehealth technology discussing the above pr

## 2021-04-17 ENCOUNTER — Encounter: Payer: Self-pay | Admitting: Internal Medicine

## 2021-04-17 ENCOUNTER — Telehealth: Payer: Self-pay

## 2021-04-17 DIAGNOSIS — C9000 Multiple myeloma not having achieved remission: Secondary | ICD-10-CM

## 2021-04-17 MED ORDER — REVLIMID 25 MG PO CAPS: ORAL_CAPSULE | ORAL | 1 refills | Status: DC

## 2021-04-17 NOTE — Telephone Encounter (Signed)
Brenda Conner [id number] and obtained 04/17/21 ?

## 2021-04-18 ENCOUNTER — Other Ambulatory Visit: Payer: 59

## 2021-04-18 ENCOUNTER — Ambulatory Visit: Payer: 59

## 2021-04-20 ENCOUNTER — Other Ambulatory Visit: Payer: Self-pay

## 2021-04-20 DIAGNOSIS — C9000 Multiple myeloma not having achieved remission: Secondary | ICD-10-CM

## 2021-04-20 MED ORDER — REVLIMID 25 MG PO CAPS: ORAL_CAPSULE | ORAL | 1 refills | Status: DC

## 2021-04-25 ENCOUNTER — Ambulatory Visit: Payer: 59

## 2021-04-25 ENCOUNTER — Other Ambulatory Visit: Payer: 59

## 2021-05-02 ENCOUNTER — Inpatient Hospital Stay: Payer: 59

## 2021-05-02 ENCOUNTER — Other Ambulatory Visit: Payer: Self-pay

## 2021-05-02 VITALS — BP 140/82 | HR 72 | Temp 97.7°F | Resp 18 | Ht 68.0 in | Wt 249.0 lb

## 2021-05-02 DIAGNOSIS — C9000 Multiple myeloma not having achieved remission: Secondary | ICD-10-CM

## 2021-05-02 DIAGNOSIS — Z5112 Encounter for antineoplastic immunotherapy: Secondary | ICD-10-CM | POA: Diagnosis not present

## 2021-05-02 LAB — CMP (CANCER CENTER ONLY)
ALT: 23 U/L (ref 0–44)
AST: 36 U/L (ref 15–41)
Albumin: 4.3 g/dL (ref 3.5–5.0)
Alkaline Phosphatase: 148 U/L — ABNORMAL HIGH (ref 38–126)
Anion gap: 7 (ref 5–15)
BUN: 10 mg/dL (ref 8–23)
CO2: 25 mmol/L (ref 22–32)
Calcium: 9.3 mg/dL (ref 8.9–10.3)
Chloride: 101 mmol/L (ref 98–111)
Creatinine: 0.72 mg/dL (ref 0.44–1.00)
GFR, Estimated: >60 mL/min (ref >60)
Glucose, Bld: 133 mg/dL — ABNORMAL HIGH (ref 70–99)
Potassium: 4.4 mmol/L (ref 3.5–5.1)
Sodium: 133 mmol/L — ABNORMAL LOW (ref 135–145)
Total Bilirubin: 0.7 mg/dL (ref 0.3–1.2)
Total Protein: 7.2 g/dL (ref 6.5–8.1)

## 2021-05-02 LAB — CBC WITH DIFFERENTIAL (CANCER CENTER ONLY)
Abs Immature Granulocytes: 0 10*3/uL (ref 0.00–0.07)
Basophils Absolute: 0 10*3/uL (ref 0.0–0.1)
Basophils Relative: 1 %
Eosinophils Absolute: 0.1 10*3/uL (ref 0.0–0.5)
Eosinophils Relative: 2 %
HCT: 33.1 % — ABNORMAL LOW (ref 36.0–46.0)
Hemoglobin: 10.9 g/dL — ABNORMAL LOW (ref 12.0–15.0)
Immature Granulocytes: 0 %
Lymphocytes Relative: 24 %
Lymphs Abs: 0.7 10*3/uL (ref 0.7–4.0)
MCH: 29.9 pg (ref 26.0–34.0)
MCHC: 32.9 g/dL (ref 30.0–36.0)
MCV: 90.7 fL (ref 80.0–100.0)
Monocytes Absolute: 0.3 10*3/uL (ref 0.1–1.0)
Monocytes Relative: 10 %
Neutro Abs: 1.9 10*3/uL (ref 1.7–7.7)
Neutrophils Relative %: 63 %
Platelet Count: 99 10*3/uL — ABNORMAL LOW (ref 150–400)
RBC: 3.65 MIL/uL — ABNORMAL LOW (ref 3.87–5.11)
RDW: 15.6 % — ABNORMAL HIGH (ref 11.5–15.5)
WBC Count: 3.1 10*3/uL — ABNORMAL LOW (ref 4.0–10.5)
nRBC: 0 % (ref 0.0–0.2)

## 2021-05-02 MED ORDER — ONDANSETRON HCL 8 MG PO TABS
8.0000 mg | ORAL_TABLET | Freq: Once | ORAL | Status: AC
  Administered 2021-05-02: 8 mg via ORAL
  Filled 2021-05-02: qty 1

## 2021-05-02 MED ORDER — BORTEZOMIB CHEMO SQ INJECTION 3.5 MG (2.5MG/ML)
1.3000 mg/m2 | Freq: Once | INTRAMUSCULAR | Status: AC
  Administered 2021-05-02: 3 mg via SUBCUTANEOUS
  Filled 2021-05-02: qty 1.2

## 2021-05-02 NOTE — Progress Notes (Signed)
Per Dr. Julien Nordmann , it is okay to treat pt today with Velcade and platelets of 99k. ?

## 2021-05-09 ENCOUNTER — Inpatient Hospital Stay: Payer: 59 | Attending: Physician Assistant | Admitting: Internal Medicine

## 2021-05-09 ENCOUNTER — Encounter: Payer: Self-pay | Admitting: Internal Medicine

## 2021-05-09 ENCOUNTER — Inpatient Hospital Stay: Payer: 59

## 2021-05-09 ENCOUNTER — Other Ambulatory Visit: Payer: Self-pay

## 2021-05-09 VITALS — BP 145/55 | HR 68 | Temp 96.9°F | Resp 16 | Wt 249.3 lb

## 2021-05-09 VITALS — Temp 97.9°F

## 2021-05-09 DIAGNOSIS — M545 Low back pain, unspecified: Secondary | ICD-10-CM | POA: Insufficient documentation

## 2021-05-09 DIAGNOSIS — R110 Nausea: Secondary | ICD-10-CM | POA: Insufficient documentation

## 2021-05-09 DIAGNOSIS — R5383 Other fatigue: Secondary | ICD-10-CM | POA: Insufficient documentation

## 2021-05-09 DIAGNOSIS — R197 Diarrhea, unspecified: Secondary | ICD-10-CM | POA: Diagnosis not present

## 2021-05-09 DIAGNOSIS — D509 Iron deficiency anemia, unspecified: Secondary | ICD-10-CM | POA: Diagnosis not present

## 2021-05-09 DIAGNOSIS — Z7952 Long term (current) use of systemic steroids: Secondary | ICD-10-CM | POA: Insufficient documentation

## 2021-05-09 DIAGNOSIS — K5900 Constipation, unspecified: Secondary | ICD-10-CM | POA: Insufficient documentation

## 2021-05-09 DIAGNOSIS — Z880 Allergy status to penicillin: Secondary | ICD-10-CM | POA: Diagnosis not present

## 2021-05-09 DIAGNOSIS — Z885 Allergy status to narcotic agent status: Secondary | ICD-10-CM | POA: Insufficient documentation

## 2021-05-09 DIAGNOSIS — N3090 Cystitis, unspecified without hematuria: Secondary | ICD-10-CM | POA: Diagnosis not present

## 2021-05-09 DIAGNOSIS — Z5111 Encounter for antineoplastic chemotherapy: Secondary | ICD-10-CM | POA: Diagnosis not present

## 2021-05-09 DIAGNOSIS — C9030 Solitary plasmacytoma not having achieved remission: Secondary | ICD-10-CM | POA: Insufficient documentation

## 2021-05-09 DIAGNOSIS — Z7969 Long term (current) use of other immunomodulators and immunosuppressants: Secondary | ICD-10-CM | POA: Diagnosis not present

## 2021-05-09 DIAGNOSIS — C9000 Multiple myeloma not having achieved remission: Secondary | ICD-10-CM

## 2021-05-09 DIAGNOSIS — Z9049 Acquired absence of other specified parts of digestive tract: Secondary | ICD-10-CM | POA: Insufficient documentation

## 2021-05-09 DIAGNOSIS — G629 Polyneuropathy, unspecified: Secondary | ICD-10-CM | POA: Insufficient documentation

## 2021-05-09 DIAGNOSIS — Z7961 Long term (current) use of immunomodulator: Secondary | ICD-10-CM | POA: Diagnosis not present

## 2021-05-09 DIAGNOSIS — Z5112 Encounter for antineoplastic immunotherapy: Secondary | ICD-10-CM | POA: Diagnosis not present

## 2021-05-09 DIAGNOSIS — Z79899 Other long term (current) drug therapy: Secondary | ICD-10-CM | POA: Insufficient documentation

## 2021-05-09 LAB — CMP (CANCER CENTER ONLY)
ALT: 31 U/L (ref 0–44)
AST: 36 U/L (ref 15–41)
Albumin: 4 g/dL (ref 3.5–5.0)
Alkaline Phosphatase: 145 U/L — ABNORMAL HIGH (ref 38–126)
Anion gap: 9 (ref 5–15)
BUN: 9 mg/dL (ref 8–23)
CO2: 24 mmol/L (ref 22–32)
Calcium: 8.9 mg/dL (ref 8.9–10.3)
Chloride: 101 mmol/L (ref 98–111)
Creatinine: 0.77 mg/dL (ref 0.44–1.00)
GFR, Estimated: >60 mL/min (ref >60)
Glucose, Bld: 214 mg/dL — ABNORMAL HIGH (ref 70–99)
Potassium: 3.9 mmol/L (ref 3.5–5.1)
Sodium: 134 mmol/L — ABNORMAL LOW (ref 135–145)
Total Bilirubin: 0.7 mg/dL (ref 0.3–1.2)
Total Protein: 6.8 g/dL (ref 6.5–8.1)

## 2021-05-09 LAB — CBC WITH DIFFERENTIAL (CANCER CENTER ONLY)
Abs Immature Granulocytes: 0.03 10*3/uL (ref 0.00–0.07)
Basophils Absolute: 0 10*3/uL (ref 0.0–0.1)
Basophils Relative: 0 %
Eosinophils Absolute: 0.1 10*3/uL (ref 0.0–0.5)
Eosinophils Relative: 4 %
HCT: 32.8 % — ABNORMAL LOW (ref 36.0–46.0)
Hemoglobin: 10.7 g/dL — ABNORMAL LOW (ref 12.0–15.0)
Immature Granulocytes: 1 %
Lymphocytes Relative: 19 %
Lymphs Abs: 0.5 10*3/uL — ABNORMAL LOW (ref 0.7–4.0)
MCH: 29.9 pg (ref 26.0–34.0)
MCHC: 32.6 g/dL (ref 30.0–36.0)
MCV: 91.6 fL (ref 80.0–100.0)
Monocytes Absolute: 0.2 10*3/uL (ref 0.1–1.0)
Monocytes Relative: 8 %
Neutro Abs: 1.9 10*3/uL (ref 1.7–7.7)
Neutrophils Relative %: 68 %
Platelet Count: 98 10*3/uL — ABNORMAL LOW (ref 150–400)
RBC: 3.58 MIL/uL — ABNORMAL LOW (ref 3.87–5.11)
RDW: 15.7 % — ABNORMAL HIGH (ref 11.5–15.5)
WBC Count: 2.7 10*3/uL — ABNORMAL LOW (ref 4.0–10.5)
nRBC: 0 % (ref 0.0–0.2)

## 2021-05-09 MED ORDER — ONDANSETRON HCL 8 MG PO TABS
8.0000 mg | ORAL_TABLET | Freq: Once | ORAL | Status: AC
  Administered 2021-05-09: 8 mg via ORAL
  Filled 2021-05-09: qty 1

## 2021-05-09 MED ORDER — BORTEZOMIB CHEMO SQ INJECTION 3.5 MG (2.5MG/ML)
1.3000 mg/m2 | Freq: Once | INTRAMUSCULAR | Status: AC
  Administered 2021-05-09: 3 mg via SUBCUTANEOUS
  Filled 2021-05-09: qty 1.2

## 2021-05-09 NOTE — Progress Notes (Signed)
?  ?    [facility] ?[phone]   [phone] ? ?OFFICE PROGRESS NOTE ? ?[name], MD ?[place] ?[place] [address] ? 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  ? ?PRIOR THERAPY:  ?1) Velcade 1.3 MG/M2 subcutaneously with Decadron 40 mg by mouth on a weekly basis. First cycle 11/24/2013. She status post 31 weekly doses of treatment. ?2) Velcade 1.3 MG/M2 subcutaneously and weekly basis with Decadron 40 mg by mouth weekly. First dose 02/01/2015. Status post 28 cycles. ?3) Revlimid 25 mg by mouth daily for 21 days every 4 weeks with weekly Decadron 20 mg. started in 11/27/2015. Status post 3 cycles discontinued secondary to lack of response. ? ?CURRENT THERAPY: Systemic treatment with Velcade 1.3 MG/KG weekly, Revlimid 25 mg by mouth daily for 21 days every 4 weeks in addition to Decadron 20 mg by mouth weekly. First dose 03/06/2016. Status post 42 cycles.  She has a break off treatment from June 2021 until July 2022. ? ?INTERVAL HISTORY: ?Brenda Conner 62 y.o. female returns to the clinic today for follow-up visit.  The patient is feeling fine today with no concerning complaints.  She was diagnosed with COVID-19 4 weeks ago.  She had sinus infection at that time and she was also treated with Z-Pak.  She denied having any current fever or chills.  She has no nausea, vomiting, diarrhea or constipation.  She has no headache or visual changes.  She has no chest pain, shortness of breath, cough or hemoptysis.  She continues to tolerate her treatment with Velcade, Revlimid and Decadron fairly well. ? ?MEDICAL HISTORY: ?Past Medical History:  ?Diagnosis Date  ? Acid reflux   ? Anxiety   ? Asthma   ? Cancer [facility])   ? waldenstroms/ macroglobinulemia  ? Depression   ? Depression   ? Diabetes mellitus without complication ([place])   ? Dysuria 02/28/2016  ? Hypercholesteremia   ?  Hypertension   ? Hypothyroidism   ? Macroglobulinemia ([place])   ? ? POEMS syndrome  ? Multiple myeloma ([place])   ? Obesity   ? PONV (postoperative nausea and vomiting)   ? Sleep apnea   ? CPAP at bedtime  ? Sleep apnea   ? ? ?ALLERGIES:  is allergic to codeine, hydrocodone, lortab [hydrocodone-acetaminophen], onion, shellfish allergy, and amoxicillin. ? ?MEDICATIONS:  ?Current Outpatient Medications  ?Medication Sig Dispense Refill  ? ACCU-CHEK AVIVA PLUS test strip USE TO CHECK SUGAR TWICE A DAY 90    ? acyclovir (ZOVIRAX) 400 MG tablet Take 1 tablet (400 mg total) by mouth 2 (two) times daily. 180 tablet 1  ? ALPRAZolam (XANAX) 0.5 MG tablet Take 1 tablet (0.5 mg total) by mouth at bedtime as needed for anxiety. 30 tablet 1  ? aspirin 81 MG chewable tablet Chew by mouth daily.    ? Azelastine HCl 0.15 % SOLN as needed.    ? B-D UF III MINI PEN NEEDLES 31G X 5 MM MISC     ? Blood Glucose Monitoring Suppl (ONE TOUCH ULTRA MINI) W/DEVICE KIT See admin instructions. Reported on 01/25/2015  0  ? Cetirizine HCl (ZYRTEC ALLERGY PO) Take by mouth daily.    ? dexamethasone (DECADRON) 4 MG tablet 5 tablets p.o. weekly on the day of chemotherapy. 60 tablet 2  ? DiphenhydrAMINE HCl (BENADRYL ALLERGY PO) Take by mouth as needed.    ? Fexofenadine HCl (  ALLEGRA ALLERGY PO) Take by mouth daily.    ? Fluticasone-Salmeterol (ADVAIR) 100-50 MCG/DOSE AEPB Inhale 2 puffs into the lungs every 12 (twelve) hours.    ? Insulin Glargine (BASAGLAR KWIKPEN) 100 UNIT/ML Inject 20 Units into the skin at bedtime.    ? lisinopril (PRINIVIL,ZESTRIL) 10 MG tablet Take 10 mg by mouth daily. auth number 05/29/2018 [id number]  3  ? metFORMIN (GLUCOPHAGE-XR) 500 MG 24 hr tablet Take 500 mg by mouth daily.    ? NOVOLOG FLEXPEN 100 UNIT/ML FlexPen Inject 15 Units into the skin in the morning, at noon, and at bedtime. Per Sliding Scale    ? omeprazole (PRILOSEC) 40 MG capsule Take 40 mg by mouth 2 (two) times daily.     ? pravastatin (PRAVACHOL) 40 MG tablet Take  40 mg by mouth every evening.     ? PROAIR HFA 108 (90 Base) MCG/ACT inhaler Reported on 06/21/2015  1  ? REVLIMID 25 MG capsule TAKE 1 CAPSULE BY MOUTH ONCE DAILY 21 DAYS ON AND 7 DAYS OFF [employer] Auth # [id number]   Date Obtained 04/17/2021. Adult female NOT of childbearing potential 21 capsule 1  ? sertraline (ZOLOFT) 50 MG tablet Take 3 tablets (150 mg total) by mouth daily. 270 tablet 3  ? verapamil (CALAN-SR) 240 MG CR tablet Take 240 mg by mouth 2 (two) times daily.    ? azithromycin (ZITHROMAX) 250 MG tablet Use as instructed 6 each 0  ? bortezomib IV (VELCADE) 3.5 MG injection Inject 1.3 mg/m2 into the vein once.    ? EPIPEN 2-PAK 0.3 MG/0.3ML SOAJ injection Reported on 06/21/2015 (Patient not taking: Reported on 12/20/2020)  1  ? ibuprofen (ADVIL,MOTRIN) 100 MG tablet Take 100 mg by mouth every 6 (six) hours as needed. Reported on 05/31/2015 (Patient not taking: Reported on 12/20/2020)    ? levothyroxine (SYNTHROID, LEVOTHROID) 150 MCG tablet Take 150 mcg by mouth daily before breakfast.    ? loperamide (IMODIUM) 2 MG capsule Take 2 mg by mouth as needed for diarrhea or loose stools. (Patient not taking: Reported on 12/20/2020)    ? magic mouthwash w/lidocaine SOLN Take 5 mLs by mouth 4 (four) times daily as needed for mouth pain. Swish, Gargle, and spit 240 mL 1  ? ondansetron (ZOFRAN-ODT) 8 MG disintegrating tablet Take 1 tablet (8 mg total) by mouth every 8 (eight) hours as needed. Reported on 05/31/2015 (Patient not taking: Reported on 12/20/2020) 30 tablet 2  ? ?No current facility-administered medications for this visit.  ? ? ?SURGICAL HISTORY:  ?Past Surgical History:  ?Procedure Laterality Date  ? ABLATION  09/09/2007  ? HTA and polyp resection  ? BACK SURGERY  03/08/2004  ? herniation, L4-L5  ? Bil Laprascopic knee surgery    ? BONE MARROW BIOPSY    ? 2011  ? BONE MARROW BIOPSY  04/08/2012  ? BONE MARROW BIOPSY  01/2020  ? CHOLECYSTECTOMY    ? FOOT SURGERY  01/09/1996  ? KNEE ARTHROSCOPY W/ MENISCAL  REPAIR  10/10 ; 3/11  ? LAPAROSCOPIC CHOLECYSTECTOMY  01/09/1995  ? NASAL SINUS SURGERY  01/08/2002  ? UTERINE FIBROID EMBOLIZATION    ? ? ?REVIEW OF SYSTEMS:  A comprehensive review of systems was negative except for: Constitutional: positive for fatigue  ? ?PHYSICAL EXAMINATION: General appearance: alert, cooperative, fatigued, and no distress ?Head: Normocephalic, without obvious abnormality, atraumatic ?Neck: no adenopathy ?Lymph nodes: Cervical, supraclavicular, and axillary nodes normal. ?Resp: clear to auscultation bilaterally ?Back: symmetric, no curvature. ROM normal. No CVA tenderness. ?  Cardio: regular rate and rhythm, S1, S2 normal, no murmur, click, rub or gallop ?GI: soft, non-tender; bowel sounds normal; no masses,  no organomegaly ?Extremities: extremities normal, atraumatic, no cyanosis or edema ? ?ECOG PERFORMANCE STATUS: 1 - Symptomatic but completely ambulatory ? ?Blood pressure (!) 145/55, pulse 68, temperature (!) 96.9 ?F (36.1 ?C), temperature source Tympanic, resp. rate 16, weight 249 lb 5 oz (113.1 kg), SpO2 99 %. ? ?LABORATORY DATA: ?Lab Results  ?Component Value Date  ? WBC 2.7 (L) 05/09/2021  ? HGB 10.7 (L) 05/09/2021  ? HCT 32.8 (L) 05/09/2021  ? MCV 91.6 05/09/2021  ? PLT 98 (L) 05/09/2021  ? ? ?  Chemistry   ?   ?Component Value Date/Time  ? NA 133 (L) 05/02/2021 1149  ? NA 138 11/14/2016 0824  ? K 4.4 05/02/2021 1149  ? K 4.3 11/14/2016 0824  ? CL 101 05/02/2021 1149  ? CL 104 04/07/2012 1415  ? CO2 25 05/02/2021 1149  ? CO2 23 11/14/2016 0824  ? BUN 10 05/02/2021 1149  ? BUN 9.1 11/14/2016 0824  ? CREATININE 0.72 05/02/2021 1149  ? CREATININE 0.7 11/14/2016 0824  ?    ?Component Value Date/Time  ? CALCIUM 9.3 05/02/2021 1149  ? CALCIUM 9.6 11/14/2016 0824  ? ALKPHOS 148 (H) 05/02/2021 1149  ? ALKPHOS 94 11/14/2016 0824  ? AST 36 05/02/2021 1149  ? AST 28 11/14/2016 0824  ? ALT 23 05/02/2021 1149  ? ALT 24 11/14/2016 0824  ? BILITOT 0.7 05/02/2021 1149  ? BILITOT 0.41 11/14/2016 0824   ?  ? ? 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  ?The patient has been on treatment with subc

## 2021-05-09 NOTE — Progress Notes (Signed)
Per Dr. Julien Nordmann , it is ok to treat pt today with Velcade and platelets of 98k. ?

## 2021-05-09 NOTE — Patient Instructions (Signed)
[facility]  Discharge Instructions: Thank you for choosing [facility] to provide your oncology and hematology care.   If you have a lab appointment with the Cancer Center, please go directly to the Cancer Center and check in at the registration area.   Wear comfortable clothing and clothing appropriate for easy access to any Portacath or PICC line.   We strive to give you quality time with your provider. You may need to reschedule your appointment if you arrive late (15 or more minutes).  Arriving late affects you and other patients whose appointments are after yours.  Also, if you miss three or more appointments without notifying the office, you may be dismissed from the clinic at the provider's discretion.      For prescription refill requests, have your pharmacy contact our office and allow 72 hours for refills to be completed.    Today you received the following chemotherapy and/or immunotherapy agents Velcade       To help prevent nausea and vomiting after your treatment, we encourage you to take your nausea medication as directed.  BELOW ARE SYMPTOMS THAT SHOULD BE REPORTED IMMEDIATELY: *FEVER GREATER THAN 100.4 F (38 C) OR HIGHER *CHILLS OR SWEATING *NAUSEA AND VOMITING THAT IS NOT CONTROLLED WITH YOUR NAUSEA MEDICATION *UNUSUAL SHORTNESS OF BREATH *UNUSUAL BRUISING OR BLEEDING *URINARY PROBLEMS (pain or burning when urinating, or frequent urination) *BOWEL PROBLEMS (unusual diarrhea, constipation, pain near the anus) TENDERNESS IN MOUTH AND THROAT WITH OR WITHOUT PRESENCE OF ULCERS (sore throat, sores in mouth, or a toothache) UNUSUAL RASH, SWELLING OR PAIN  UNUSUAL VAGINAL DISCHARGE OR ITCHING   Items with * indicate a potential emergency and should be followed up as soon as possible or go to the Emergency Department if any problems should occur.  Please show the CHEMOTHERAPY ALERT CARD or IMMUNOTHERAPY ALERT CARD at check-in to  the Emergency Department and triage nurse.  Should you have questions after your visit or need to cancel or reschedule your appointment, please contact [facility]  Dept: [phone]  and follow the prompts.  Office hours are 8:00 a.m. to 4:30 p.m. Monday - Friday. Please note that voicemails left after 4:00 p.m. may not be returned until the following business day.  We are closed weekends and major holidays. You have access to a nurse at all times for urgent questions. Please call the main number to the clinic Dept: [phone] and follow the prompts.   For any non-urgent questions, you may also contact your provider using MyChart. We now offer e-Visits for anyone 18 and older to request care online for non-urgent symptoms. For details visit [email or web address].   Also download the MyChart app! Go to the app store, search "MyChart", open the app, select [place], and log in with your MyChart username and password.  Due to Covid, a mask is required upon entering the hospital/clinic. If you do not have a mask, one will be given to you upon arrival. For doctor visits, patients may have 1 support person aged 18 or older with them. For treatment visits, patients cannot have anyone with them due to current Covid guidelines and our immunocompromised population.   

## 2021-05-15 ENCOUNTER — Telehealth: Payer: Self-pay | Admitting: Internal Medicine

## 2021-05-15 NOTE — Telephone Encounter (Signed)
Called patient regarding upcoming appointment, left a voicemail. 

## 2021-05-16 ENCOUNTER — Other Ambulatory Visit: Payer: Self-pay

## 2021-05-16 ENCOUNTER — Inpatient Hospital Stay: Payer: 59

## 2021-05-16 VITALS — BP 136/55 | HR 69 | Temp 98.0°F | Resp 17 | Wt 249.8 lb

## 2021-05-16 DIAGNOSIS — C9000 Multiple myeloma not having achieved remission: Secondary | ICD-10-CM

## 2021-05-16 DIAGNOSIS — Z5112 Encounter for antineoplastic immunotherapy: Secondary | ICD-10-CM | POA: Diagnosis not present

## 2021-05-16 LAB — CMP (CANCER CENTER ONLY)
ALT: 42 U/L (ref 0–44)
AST: 47 U/L — ABNORMAL HIGH (ref 15–41)
Albumin: 4.1 g/dL (ref 3.5–5.0)
Alkaline Phosphatase: 137 U/L — ABNORMAL HIGH (ref 38–126)
Anion gap: 10 (ref 5–15)
BUN: 7 mg/dL — ABNORMAL LOW (ref 8–23)
CO2: 22 mmol/L (ref 22–32)
Calcium: 8.9 mg/dL (ref 8.9–10.3)
Chloride: 102 mmol/L (ref 98–111)
Creatinine: 0.76 mg/dL (ref 0.44–1.00)
GFR, Estimated: >60 mL/min (ref >60)
Glucose, Bld: 182 mg/dL — ABNORMAL HIGH (ref 70–99)
Potassium: 3.9 mmol/L (ref 3.5–5.1)
Sodium: 134 mmol/L — ABNORMAL LOW (ref 135–145)
Total Bilirubin: 0.9 mg/dL (ref 0.3–1.2)
Total Protein: 7.1 g/dL (ref 6.5–8.1)

## 2021-05-16 LAB — CBC WITH DIFFERENTIAL (CANCER CENTER ONLY)
Abs Immature Granulocytes: 0.03 10*3/uL (ref 0.00–0.07)
Basophils Absolute: 0 10*3/uL (ref 0.0–0.1)
Basophils Relative: 0 %
Eosinophils Absolute: 0.1 10*3/uL (ref 0.0–0.5)
Eosinophils Relative: 2 %
HCT: 33.8 % — ABNORMAL LOW (ref 36.0–46.0)
Hemoglobin: 11 g/dL — ABNORMAL LOW (ref 12.0–15.0)
Immature Granulocytes: 1 %
Lymphocytes Relative: 14 %
Lymphs Abs: 0.7 10*3/uL (ref 0.7–4.0)
MCH: 29.7 pg (ref 26.0–34.0)
MCHC: 32.5 g/dL (ref 30.0–36.0)
MCV: 91.4 fL (ref 80.0–100.0)
Monocytes Absolute: 0.4 10*3/uL (ref 0.1–1.0)
Monocytes Relative: 8 %
Neutro Abs: 3.6 10*3/uL (ref 1.7–7.7)
Neutrophils Relative %: 75 %
Platelet Count: 97 10*3/uL — ABNORMAL LOW (ref 150–400)
RBC: 3.7 MIL/uL — ABNORMAL LOW (ref 3.87–5.11)
RDW: 15.9 % — ABNORMAL HIGH (ref 11.5–15.5)
WBC Count: 4.8 10*3/uL (ref 4.0–10.5)
nRBC: 0 % (ref 0.0–0.2)

## 2021-05-16 MED ORDER — BORTEZOMIB CHEMO SQ INJECTION 3.5 MG (2.5MG/ML)
1.3000 mg/m2 | Freq: Once | INTRAMUSCULAR | Status: AC
  Administered 2021-05-16: 3 mg via SUBCUTANEOUS
  Filled 2021-05-16: qty 1.2

## 2021-05-16 MED ORDER — ONDANSETRON HCL 8 MG PO TABS
8.0000 mg | ORAL_TABLET | Freq: Once | ORAL | Status: AC
  Administered 2021-05-16: 8 mg via ORAL
  Filled 2021-05-16: qty 1

## 2021-05-16 NOTE — Progress Notes (Signed)
Ok treat with plts 97 K/uL and CMP from 05/09/21 per Dr Julien Nordmann ?

## 2021-05-16 NOTE — Patient Instructions (Signed)
[facility]  Discharge Instructions: Thank you for choosing [facility] to provide your oncology and hematology care.   If you have a lab appointment with the Cancer Center, please go directly to the Cancer Center and check in at the registration area.   Wear comfortable clothing and clothing appropriate for easy access to any Portacath or PICC line.   We strive to give you quality time with your provider. You may need to reschedule your appointment if you arrive late (15 or more minutes).  Arriving late affects you and other patients whose appointments are after yours.  Also, if you miss three or more appointments without notifying the office, you may be dismissed from the clinic at the provider's discretion.      For prescription refill requests, have your pharmacy contact our office and allow 72 hours for refills to be completed.    Today you received the following chemotherapy and/or immunotherapy agents Velcade       To help prevent nausea and vomiting after your treatment, we encourage you to take your nausea medication as directed.  BELOW ARE SYMPTOMS THAT SHOULD BE REPORTED IMMEDIATELY: *FEVER GREATER THAN 100.4 F (38 C) OR HIGHER *CHILLS OR SWEATING *NAUSEA AND VOMITING THAT IS NOT CONTROLLED WITH YOUR NAUSEA MEDICATION *UNUSUAL SHORTNESS OF BREATH *UNUSUAL BRUISING OR BLEEDING *URINARY PROBLEMS (pain or burning when urinating, or frequent urination) *BOWEL PROBLEMS (unusual diarrhea, constipation, pain near the anus) TENDERNESS IN MOUTH AND THROAT WITH OR WITHOUT PRESENCE OF ULCERS (sore throat, sores in mouth, or a toothache) UNUSUAL RASH, SWELLING OR PAIN  UNUSUAL VAGINAL DISCHARGE OR ITCHING   Items with * indicate a potential emergency and should be followed up as soon as possible or go to the Emergency Department if any problems should occur.  Please show the CHEMOTHERAPY ALERT CARD or IMMUNOTHERAPY ALERT CARD at check-in to  the Emergency Department and triage nurse.  Should you have questions after your visit or need to cancel or reschedule your appointment, please contact [facility]  Dept: [phone]  and follow the prompts.  Office hours are 8:00 a.m. to 4:30 p.m. Monday - Friday. Please note that voicemails left after 4:00 p.m. may not be returned until the following business day.  We are closed weekends and major holidays. You have access to a nurse at all times for urgent questions. Please call the main number to the clinic Dept: [phone] and follow the prompts.   For any non-urgent questions, you may also contact your provider using MyChart. We now offer e-Visits for anyone 18 and older to request care online for non-urgent symptoms. For details visit [email or web address].   Also download the MyChart app! Go to the app store, search "MyChart", open the app, select [place], and log in with your MyChart username and password.  Due to Covid, a mask is required upon entering the hospital/clinic. If you do not have a mask, one will be given to you upon arrival. For doctor visits, patients may have 1 support person aged 18 or older with them. For treatment visits, patients cannot have anyone with them due to current Covid guidelines and our immunocompromised population.   

## 2021-05-18 ENCOUNTER — Encounter (HOSPITAL_BASED_OUTPATIENT_CLINIC_OR_DEPARTMENT_OTHER): Payer: Self-pay | Admitting: Obstetrics & Gynecology

## 2021-05-18 ENCOUNTER — Encounter: Payer: Self-pay | Admitting: Internal Medicine

## 2021-05-18 ENCOUNTER — Other Ambulatory Visit: Payer: Self-pay

## 2021-05-18 DIAGNOSIS — C9000 Multiple myeloma not having achieved remission: Secondary | ICD-10-CM

## 2021-05-18 MED ORDER — REVLIMID 25 MG PO CAPS: ORAL_CAPSULE | ORAL | 1 refills | Status: DC

## 2021-05-19 ENCOUNTER — Other Ambulatory Visit (HOSPITAL_BASED_OUTPATIENT_CLINIC_OR_DEPARTMENT_OTHER): Payer: Self-pay | Admitting: Obstetrics & Gynecology

## 2021-05-19 DIAGNOSIS — F419 Anxiety disorder, unspecified: Secondary | ICD-10-CM

## 2021-05-19 MED ORDER — ALPRAZOLAM 0.5 MG PO TABS: 0.5000 mg | ORAL_TABLET | Freq: Every evening | ORAL | 1 refills | Status: DC | PRN

## 2021-05-23 ENCOUNTER — Inpatient Hospital Stay: Payer: 59

## 2021-05-23 ENCOUNTER — Other Ambulatory Visit: Payer: Self-pay

## 2021-05-23 VITALS — BP 116/56 | HR 71 | Temp 98.2°F | Resp 18 | Wt 251.5 lb

## 2021-05-23 DIAGNOSIS — C9000 Multiple myeloma not having achieved remission: Secondary | ICD-10-CM

## 2021-05-23 DIAGNOSIS — Z5112 Encounter for antineoplastic immunotherapy: Secondary | ICD-10-CM | POA: Diagnosis not present

## 2021-05-23 LAB — CBC WITH DIFFERENTIAL (CANCER CENTER ONLY)
Abs Immature Granulocytes: 0.01 10*3/uL (ref 0.00–0.07)
Basophils Absolute: 0 10*3/uL (ref 0.0–0.1)
Basophils Relative: 1 %
Eosinophils Absolute: 0 10*3/uL (ref 0.0–0.5)
Eosinophils Relative: 1 %
HCT: 31.4 % — ABNORMAL LOW (ref 36.0–46.0)
Hemoglobin: 10.8 g/dL — ABNORMAL LOW (ref 12.0–15.0)
Immature Granulocytes: 0 %
Lymphocytes Relative: 20 %
Lymphs Abs: 0.7 10*3/uL (ref 0.7–4.0)
MCH: 31.2 pg (ref 26.0–34.0)
MCHC: 34.4 g/dL (ref 30.0–36.0)
MCV: 90.8 fL (ref 80.0–100.0)
Monocytes Absolute: 0.4 10*3/uL (ref 0.1–1.0)
Monocytes Relative: 12 %
Neutro Abs: 2.2 10*3/uL (ref 1.7–7.7)
Neutrophils Relative %: 66 %
Platelet Count: 106 10*3/uL — ABNORMAL LOW (ref 150–400)
RBC: 3.46 MIL/uL — ABNORMAL LOW (ref 3.87–5.11)
RDW: 15.8 % — ABNORMAL HIGH (ref 11.5–15.5)
WBC Count: 3.3 10*3/uL — ABNORMAL LOW (ref 4.0–10.5)
nRBC: 0 % (ref 0.0–0.2)

## 2021-05-23 LAB — CMP (CANCER CENTER ONLY)
ALT: 28 U/L (ref 0–44)
AST: 35 U/L (ref 15–41)
Albumin: 4 g/dL (ref 3.5–5.0)
Alkaline Phosphatase: 136 U/L — ABNORMAL HIGH (ref 38–126)
Anion gap: 6 (ref 5–15)
BUN: 11 mg/dL (ref 8–23)
CO2: 24 mmol/L (ref 22–32)
Calcium: 8.6 mg/dL — ABNORMAL LOW (ref 8.9–10.3)
Chloride: 104 mmol/L (ref 98–111)
Creatinine: 0.86 mg/dL (ref 0.44–1.00)
GFR, Estimated: >60 mL/min (ref >60)
Glucose, Bld: 176 mg/dL — ABNORMAL HIGH (ref 70–99)
Potassium: 4.1 mmol/L (ref 3.5–5.1)
Sodium: 134 mmol/L — ABNORMAL LOW (ref 135–145)
Total Bilirubin: 0.7 mg/dL (ref 0.3–1.2)
Total Protein: 6.7 g/dL (ref 6.5–8.1)

## 2021-05-23 MED ORDER — BORTEZOMIB CHEMO SQ INJECTION 3.5 MG (2.5MG/ML)
1.3000 mg/m2 | Freq: Once | INTRAMUSCULAR | Status: AC
  Administered 2021-05-23: 3 mg via SUBCUTANEOUS
  Filled 2021-05-23: qty 1.2

## 2021-05-23 MED ORDER — ONDANSETRON HCL 8 MG PO TABS
8.0000 mg | ORAL_TABLET | Freq: Once | ORAL | Status: AC
  Administered 2021-05-23: 8 mg via ORAL
  Filled 2021-05-23: qty 1

## 2021-05-23 NOTE — Patient Instructions (Signed)
[facility]  Discharge Instructions: Thank you for choosing [facility] to provide your oncology and hematology care.   If you have a lab appointment with the Cancer Center, please go directly to the Cancer Center and check in at the registration area.   Wear comfortable clothing and clothing appropriate for easy access to any Portacath or PICC line.   We strive to give you quality time with your provider. You may need to reschedule your appointment if you arrive late (15 or more minutes).  Arriving late affects you and other patients whose appointments are after yours.  Also, if you miss three or more appointments without notifying the office, you may be dismissed from the clinic at the provider's discretion.      For prescription refill requests, have your pharmacy contact our office and allow 72 hours for refills to be completed.    Today you received the following chemotherapy and/or immunotherapy agents Velcade       To help prevent nausea and vomiting after your treatment, we encourage you to take your nausea medication as directed.  BELOW ARE SYMPTOMS THAT SHOULD BE REPORTED IMMEDIATELY: *FEVER GREATER THAN 100.4 F (38 C) OR HIGHER *CHILLS OR SWEATING *NAUSEA AND VOMITING THAT IS NOT CONTROLLED WITH YOUR NAUSEA MEDICATION *UNUSUAL SHORTNESS OF BREATH *UNUSUAL BRUISING OR BLEEDING *URINARY PROBLEMS (pain or burning when urinating, or frequent urination) *BOWEL PROBLEMS (unusual diarrhea, constipation, pain near the anus) TENDERNESS IN MOUTH AND THROAT WITH OR WITHOUT PRESENCE OF ULCERS (sore throat, sores in mouth, or a toothache) UNUSUAL RASH, SWELLING OR PAIN  UNUSUAL VAGINAL DISCHARGE OR ITCHING   Items with * indicate a potential emergency and should be followed up as soon as possible or go to the Emergency Department if any problems should occur.  Please show the CHEMOTHERAPY ALERT CARD or IMMUNOTHERAPY ALERT CARD at check-in to  the Emergency Department and triage nurse.  Should you have questions after your visit or need to cancel or reschedule your appointment, please contact [facility]  Dept: [phone]  and follow the prompts.  Office hours are 8:00 a.m. to 4:30 p.m. Monday - Friday. Please note that voicemails left after 4:00 p.m. may not be returned until the following business day.  We are closed weekends and major holidays. You have access to a nurse at all times for urgent questions. Please call the main number to the clinic Dept: [phone] and follow the prompts.   For any non-urgent questions, you may also contact your provider using MyChart. We now offer e-Visits for anyone 18 and older to request care online for non-urgent symptoms. For details visit [email or web address].   Also download the MyChart app! Go to the app store, search "MyChart", open the app, select , and log in with your MyChart username and password.  Due to Covid, a mask is required upon entering the hospital/clinic. If you do not have a mask, one will be given to you upon arrival. For doctor visits, patients may have 1 support person aged 18 or older with them. For treatment visits, patients cannot have anyone with them due to current Covid guidelines and our immunocompromised population.   

## 2021-05-30 ENCOUNTER — Inpatient Hospital Stay: Payer: 59

## 2021-05-30 ENCOUNTER — Other Ambulatory Visit: Payer: Self-pay

## 2021-05-30 ENCOUNTER — Encounter: Payer: Self-pay | Admitting: Internal Medicine

## 2021-05-30 VITALS — BP 112/57 | HR 65 | Temp 98.2°F | Resp 18

## 2021-05-30 DIAGNOSIS — C9000 Multiple myeloma not having achieved remission: Secondary | ICD-10-CM

## 2021-05-30 DIAGNOSIS — Z5112 Encounter for antineoplastic immunotherapy: Secondary | ICD-10-CM | POA: Diagnosis not present

## 2021-05-30 LAB — CBC WITH DIFFERENTIAL (CANCER CENTER ONLY)
Abs Immature Granulocytes: 0.01 10*3/uL (ref 0.00–0.07)
Basophils Absolute: 0.1 10*3/uL (ref 0.0–0.1)
Basophils Relative: 2 %
Eosinophils Absolute: 0.1 10*3/uL (ref 0.0–0.5)
Eosinophils Relative: 3 %
HCT: 32.5 % — ABNORMAL LOW (ref 36.0–46.0)
Hemoglobin: 10.8 g/dL — ABNORMAL LOW (ref 12.0–15.0)
Immature Granulocytes: 0 %
Lymphocytes Relative: 23 %
Lymphs Abs: 0.7 10*3/uL (ref 0.7–4.0)
MCH: 30.3 pg (ref 26.0–34.0)
MCHC: 33.2 g/dL (ref 30.0–36.0)
MCV: 91 fL (ref 80.0–100.0)
Monocytes Absolute: 0.2 10*3/uL (ref 0.1–1.0)
Monocytes Relative: 6 %
Neutro Abs: 2.1 10*3/uL (ref 1.7–7.7)
Neutrophils Relative %: 66 %
Platelet Count: 113 10*3/uL — ABNORMAL LOW (ref 150–400)
RBC: 3.57 MIL/uL — ABNORMAL LOW (ref 3.87–5.11)
RDW: 15.8 % — ABNORMAL HIGH (ref 11.5–15.5)
WBC Count: 3.1 10*3/uL — ABNORMAL LOW (ref 4.0–10.5)
nRBC: 0 % (ref 0.0–0.2)

## 2021-05-30 LAB — CMP (CANCER CENTER ONLY)
ALT: 25 U/L (ref 0–44)
AST: 27 U/L (ref 15–41)
Albumin: 4 g/dL (ref 3.5–5.0)
Alkaline Phosphatase: 126 U/L (ref 38–126)
Anion gap: 8 (ref 5–15)
BUN: 7 mg/dL — ABNORMAL LOW (ref 8–23)
CO2: 26 mmol/L (ref 22–32)
Calcium: 8.8 mg/dL — ABNORMAL LOW (ref 8.9–10.3)
Chloride: 99 mmol/L (ref 98–111)
Creatinine: 0.73 mg/dL (ref 0.44–1.00)
GFR, Estimated: >60 mL/min (ref >60)
Glucose, Bld: 153 mg/dL — ABNORMAL HIGH (ref 70–99)
Potassium: 3.9 mmol/L (ref 3.5–5.1)
Sodium: 133 mmol/L — ABNORMAL LOW (ref 135–145)
Total Bilirubin: 0.9 mg/dL (ref 0.3–1.2)
Total Protein: 7 g/dL (ref 6.5–8.1)

## 2021-05-30 MED ORDER — BORTEZOMIB CHEMO SQ INJECTION 3.5 MG (2.5MG/ML)
1.3000 mg/m2 | Freq: Once | INTRAMUSCULAR | Status: AC
  Administered 2021-05-30: 3 mg via SUBCUTANEOUS
  Filled 2021-05-30: qty 1.2

## 2021-05-30 MED ORDER — ONDANSETRON HCL 8 MG PO TABS
8.0000 mg | ORAL_TABLET | Freq: Once | ORAL | Status: AC
  Administered 2021-05-30: 8 mg via ORAL
  Filled 2021-05-30: qty 1

## 2021-05-30 NOTE — Patient Instructions (Signed)
[facility]  Discharge Instructions: Thank you for choosing [facility] to provide your oncology and hematology care.   If you have a lab appointment with the Cancer Center, please go directly to the Cancer Center and check in at the registration area.   Wear comfortable clothing and clothing appropriate for easy access to any Portacath or PICC line.   We strive to give you quality time with your provider. You may need to reschedule your appointment if you arrive late (15 or more minutes).  Arriving late affects you and other patients whose appointments are after yours.  Also, if you miss three or more appointments without notifying the office, you may be dismissed from the clinic at the provider's discretion.      For prescription refill requests, have your pharmacy contact our office and allow 72 hours for refills to be completed.    Today you received the following chemotherapy and/or immunotherapy agent: Velcade      To help prevent nausea and vomiting after your treatment, we encourage you to take your nausea medication as directed.  BELOW ARE SYMPTOMS THAT SHOULD BE REPORTED IMMEDIATELY: *FEVER GREATER THAN 100.4 F (38 C) OR HIGHER *CHILLS OR SWEATING *NAUSEA AND VOMITING THAT IS NOT CONTROLLED WITH YOUR NAUSEA MEDICATION *UNUSUAL SHORTNESS OF BREATH *UNUSUAL BRUISING OR BLEEDING *URINARY PROBLEMS (pain or burning when urinating, or frequent urination) *BOWEL PROBLEMS (unusual diarrhea, constipation, pain near the anus) TENDERNESS IN MOUTH AND THROAT WITH OR WITHOUT PRESENCE OF ULCERS (sore throat, sores in mouth, or a toothache) UNUSUAL RASH, SWELLING OR PAIN  UNUSUAL VAGINAL DISCHARGE OR ITCHING   Items with * indicate a potential emergency and should be followed up as soon as possible or go to the Emergency Department if any problems should occur.  Please show the CHEMOTHERAPY ALERT CARD or IMMUNOTHERAPY ALERT CARD at check-in to the  Emergency Department and triage nurse.  Should you have questions after your visit or need to cancel or reschedule your appointment, please contact [facility]  Dept: [phone]  and follow the prompts.  Office hours are 8:00 a.m. to 4:30 p.m. Monday - Friday. Please note that voicemails left after 4:00 p.m. may not be returned until the following business day.  We are closed weekends and major holidays. You have access to a nurse at all times for urgent questions. Please call the main number to the clinic Dept: [phone] and follow the prompts.   For any non-urgent questions, you may also contact your provider using MyChart. We now offer e-Visits for anyone 18 and older to request care online for non-urgent symptoms. For details visit [email or web address].   Also download the MyChart app! Go to the app store, search "MyChart", open the app, select [place], and log in with your MyChart username and password.  Due to Covid, a mask is required upon entering the hospital/clinic. If you do not have a mask, one will be given to you upon arrival. For doctor visits, patients may have 1 support person aged 18 or older with them. For treatment visits, patients cannot have anyone with them due to current Covid guidelines and our immunocompromised population.  

## 2021-05-31 LAB — KAPPA/LAMBDA LIGHT CHAINS
Kappa free light chain: 18.2 mg/L (ref 3.3–19.4)
Kappa, lambda light chain ratio: 0.04 — ABNORMAL LOW (ref 0.26–1.65)
Lambda free light chains: 490.5 mg/L — ABNORMAL HIGH (ref 5.7–26.3)

## 2021-05-31 LAB — BETA 2 MICROGLOBULIN, SERUM: Beta-2 Microglobulin: 1.8 mg/L (ref 0.6–2.4)

## 2021-05-31 LAB — IGG, IGA, IGM
IgA: 161 mg/dL (ref 87–352)
IgG (Immunoglobin G), Serum: 541 mg/dL — ABNORMAL LOW (ref 586–1602)
IgM (Immunoglobulin M), Srm: 495 mg/dL — ABNORMAL HIGH (ref 26–217)

## 2021-05-31 NOTE — Progress Notes (Signed)
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  PRIOR THERAPY: 1) Velcade 1.3 MG/M2 subcutaneously with Decadron 40 mg by mouth on a weekly basis. First cycle 11/24/2013. She status post 31 weekly doses of treatment. 2) Velcade 1.3 MG/M2 subcutaneously and weekly basis with Decadron 40 mg by mouth weekly. First dose 02/01/2015. Status post 28 cycles. 3) Revlimid 25 mg by mouth daily for 21 days every 4 weeks with weekly Decadron 20 mg. started in 11/27/2015. Status post 3 cycles discontinued secondary to lack of response.  CURRENT THERAPY:  Systemic treatment with Velcade 1.3 MG/KG weekly, Revlimid 25 mg by mouth daily for 21 days every 4 weeks in addition to Decadron 20 mg by mouth weekly. First dose 03/06/2016. She has a break off treatment from June 2021 until July 2022 after cycle #105. Resumed July 19, 2020. She is here for cycle #145 today.     INTERVAL HISTORY: Brenda Conner 61 y.o. female returns to the clinic today for a follow-up visit.  The patient is feeling well today except she noticed starting on Sunday she was having some low back and suprapubic discomfort.  She states these are similar symptoms that she gets when she has a bladder infection.  She also noticed increased fatigue and she has some mild dysuria.  Denies any malodorous urine, although she mentions that she has been having a more challenging time smelling since she had COVID in April 2023.  Otherwise, she noticed that her peripheral neuropathy in her hands and feet has been more constant recently.  She describes this as pins-and-needles.  She is still able to perform fine motor function such as buttoning. She was found to have disease progression in July 2022 after being on observation for about 1 year.  Therefore, she  restarted her treatment with Revlimid, Velcade, and Decadron. She has been tolerating treatment well. She did not have any specific infection symptoms such as sore throat, nasal congestion, cough, shortness of breath, or skin infections. She has chronic intermittent constipation and diarrhea which is unchanged. Today, she denies any fever, chills, night sweats, or unexplained weight loss.  She denies any abnormal bleeding or bruising. She sometimes has nausea without vomiting.  She recently had another repeat myeloma panel.  She is here today for evaluation and repeat blood work before starting her next cycle of velcade.     MEDICAL HISTORY: Past Medical History:  Diagnosis Date   Acid reflux    Anxiety    Asthma    Cancer ([place])    waldenstroms/ macroglobinulemia   Depression    Depression    Diabetes mellitus without complication ([place])    Dysuria 02/28/2016   Hypercholesteremia    Hypertension    Hypothyroidism    Macroglobulinemia ([place])    ? POEMS syndrome   Multiple myeloma ([facility])    Obesity    PONV (postoperative nausea and vomiting)    Sleep apnea    CPAP at bedtime   Sleep apnea     ALLERGIES:  is allergic to codeine, hydrocodone, lortab [hydrocodone-acetaminophen], onion, shellfish allergy, and amoxicillin.  MEDICATIONS:  Current Outpatient Medications  Medication Sig Dispense Refill   gabapentin (NEURONTIN) 100 MG capsule Take 3 capsules (300 mg total) by mouth 3 (three) times daily. 90 capsule 2   ACCU-CHEK AVIVA PLUS test  strip USE TO CHECK SUGAR TWICE A DAY 90     acyclovir (ZOVIRAX) 400 MG tablet Take 1 tablet (400 mg total) by mouth 2 (two) times daily. 180 tablet 1   ALPRAZolam (XANAX) 0.5 MG tablet Take 1 tablet (0.5 mg total) by mouth at bedtime as needed for anxiety. 30 tablet 1   aspirin 81 MG chewable tablet Chew by mouth daily.     Azelastine HCl 0.15 % SOLN as needed.     azithromycin (ZITHROMAX) 250 MG tablet Use as instructed 6 each 0   B-D UF III MINI  PEN NEEDLES 31G X 5 MM MISC      Blood Glucose Monitoring Suppl (ONE TOUCH ULTRA MINI) W/DEVICE KIT See admin instructions. Reported on 01/25/2015  0   bortezomib IV (VELCADE) 3.5 MG injection Inject 1.3 mg/m2 into the vein once.     Cetirizine HCl (ZYRTEC ALLERGY PO) Take by mouth daily.     dexamethasone (DECADRON) 4 MG tablet 5 tablets p.o. weekly on the day of chemotherapy. 60 tablet 2   DiphenhydrAMINE HCl (BENADRYL ALLERGY PO) Take by mouth as needed.     EPIPEN 2-PAK 0.3 MG/0.3ML SOAJ injection Reported on 06/21/2015 (Patient not taking: Reported on 12/20/2020)  1   Fexofenadine HCl (ALLEGRA ALLERGY PO) Take by mouth daily.     Fluticasone-Salmeterol (ADVAIR) 100-50 MCG/DOSE AEPB Inhale 2 puffs into the lungs every 12 (twelve) hours.     ibuprofen (ADVIL,MOTRIN) 100 MG tablet Take 100 mg by mouth every 6 (six) hours as needed. Reported on 05/31/2015 (Patient not taking: Reported on 12/20/2020)     Insulin Glargine (BASAGLAR KWIKPEN) 100 UNIT/ML Inject 20 Units into the skin at bedtime.     levothyroxine (SYNTHROID, LEVOTHROID) 150 MCG tablet Take 150 mcg by mouth daily before breakfast.     lisinopril (PRINIVIL,ZESTRIL) 10 MG tablet Take 10 mg by mouth daily. auth number 05/29/2018 [id number]  3   loperamide (IMODIUM) 2 MG capsule Take 2 mg by mouth as needed for diarrhea or loose stools. (Patient not taking: Reported on 12/20/2020)     magic mouthwash w/lidocaine SOLN Take 5 mLs by mouth 4 (four) times daily as needed for mouth pain. Swish, Gargle, and spit 240 mL 1   metFORMIN (GLUCOPHAGE-XR) 500 MG 24 hr tablet Take 500 mg by mouth daily.     NOVOLOG FLEXPEN 100 UNIT/ML FlexPen Inject 15 Units into the skin in the morning, at noon, and at bedtime. Per Sliding Scale     omeprazole (PRILOSEC) 40 MG capsule Take 40 mg by mouth 2 (two) times daily.      ondansetron (ZOFRAN-ODT) 8 MG disintegrating tablet Take 1 tablet (8 mg total) by mouth every 8 (eight) hours as needed. Reported on 05/31/2015  (Patient not taking: Reported on 12/20/2020) 30 tablet 2   pravastatin (PRAVACHOL) 40 MG tablet Take 40 mg by mouth every evening.      PROAIR HFA 108 (90 Base) MCG/ACT inhaler Reported on 06/21/2015  1   REVLIMID 25 MG capsule TAKE 1 CAPSULE BY MOUTH ONCE DAILY 21 DAYS ON AND 7 DAYS OFF [employer] Auth # [id number]   Date Obtained 05/18/2021. Adult female NOT of childbearing potential 21 capsule 1   sertraline (ZOLOFT) 50 MG tablet Take 3 tablets (150 mg total) by mouth daily. 270 tablet 3   verapamil (CALAN-SR) 240 MG CR tablet Take 240 mg by mouth 2 (two) times daily.     No current facility-administered medications for this visit.    SURGICAL  HISTORY:  Past Surgical History:  Procedure Laterality Date   ABLATION  09/09/2007   HTA and polyp resection   BACK SURGERY  03/08/2004   herniation, L4-L5   Bil Laprascopic knee surgery     BONE MARROW BIOPSY     2011   BONE MARROW BIOPSY  04/08/2012   BONE MARROW BIOPSY  01/2020   CHOLECYSTECTOMY     FOOT SURGERY  01/09/1996   KNEE ARTHROSCOPY W/ MENISCAL REPAIR  10/10 ; 3/11   LAPAROSCOPIC CHOLECYSTECTOMY  01/09/1995   NASAL SINUS SURGERY  01/08/2002   UTERINE FIBROID EMBOLIZATION      REVIEW OF SYSTEMS:   Review of Systems  Constitutional: Positive for fatigue.  Negative for appetite change, chills,  fever and unexpected weight change.  HENT: Negative for mouth sores, nosebleeds, sore throat and trouble swallowing.   Eyes: Negative for eye problems and icterus.  Respiratory: Negative for cough, hemoptysis, shortness of breath and wheezing.   Cardiovascular: Negative for chest pain and leg swelling.  Gastrointestinal: Positive for baseline intermittent diarrhea and constipation.  Positive for mild nausea.  Positive for occasional suprapubic discomfort starting on Sunday.   Genitourinary: Positive for mild dysuria.  Negative for bladder incontinence, difficulty urinating, frequency and hematuria.   Musculoskeletal: Positive for low back  pain.  Negative for gait problem, neck pain and neck stiffness.  Skin: Negative for itching and rash.  Neurological: Positive for worsening peripheral neuropathy in the hands and feet.  Negative for dizziness, extremity weakness, gait problem, headaches, light-headedness and seizures.  Hematological: Negative for adenopathy. Does not bruise/bleed easily.  Psychiatric/Behavioral: Negative for confusion, depression and sleep disturbance. The patient is not nervous/anxious.     PHYSICAL EXAMINATION:  Blood pressure (!) 150/57, pulse 67, temperature 97.8 F (36.6 C), temperature source Oral, resp. rate 17, weight 248 lb 4.8 oz (112.6 kg), SpO2 100 %.  ECOG PERFORMANCE STATUS: 1  Physical Exam  Constitutional: Oriented to person, place, and time and well-developed, well-nourished, and in no distress.   HENT:  Head: Normocephalic and atraumatic.  Mouth/Throat: Oropharynx is clear and moist. No oropharyngeal exudate.  Eyes: Conjunctivae are normal. Right eye exhibits no discharge. Left eye exhibits no discharge. No scleral icterus.  Neck: Normal range of motion. Neck supple.  Cardiovascular: Normal rate, regular rhythm, normal heart sounds and intact distal pulses.   Pulmonary/Chest: Effort normal and breath sounds normal. No respiratory distress. No wheezes. No rales.  Abdominal: Soft. Bowel sounds are normal. Exhibits no distension and no mass. There is no tenderness.  Musculoskeletal: Normal range of motion. Exhibits no edema.  Lymphadenopathy:    No cervical adenopathy.  Neurological: Alert and oriented to person, place, and time. Exhibits normal muscle tone. Gait normal. Coordination normal.  Skin: Skin is warm and dry. No rash noted. Not diaphoretic. No erythema. No pallor.  Psychiatric: Mood, memory and judgment normal.  Vitals reviewed.  LABORATORY DATA: Lab Results  Component Value Date   WBC 2.8 (L) 06/06/2021   HGB 10.7 (L) 06/06/2021   HCT 32.3 (L) 06/06/2021   MCV 90.7  06/06/2021   PLT 89 (L) 06/06/2021      Chemistry      Component Value Date/Time   NA 136 06/06/2021 0738   NA 138 11/14/2016 0824   K 3.9 06/06/2021 0738   K 4.3 11/14/2016 0824   CL 102 06/06/2021 0738   CL 104 04/07/2012 1415   CO2 25 06/06/2021 0738   CO2 23 11/14/2016 0824   BUN 12  06/06/2021 0738   BUN 9.1 11/14/2016 0824   CREATININE 0.77 06/06/2021 0738   CREATININE 0.7 11/14/2016 0824      Component Value Date/Time   CALCIUM 9.3 06/06/2021 0738   CALCIUM 9.6 11/14/2016 0824   ALKPHOS 126 06/06/2021 0738   ALKPHOS 94 11/14/2016 0824   AST 30 06/06/2021 0738   AST 28 11/14/2016 0824   ALT 30 06/06/2021 0738   ALT 24 11/14/2016 0824   BILITOT 0.7 06/06/2021 0738   BILITOT 0.41 11/14/2016 0824       RADIOGRAPHIC STUDIES:  No results found.   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 Her myeloma panel showed continuous increase in her free lambda light chain.    The patient previously underwent 107 cycles of subcutaneous weekly Velcade, as well as Revlimid and Decadron on and off over the last several years.   She had been off treatment since June 2021 until July 2022 in which she had evidence of disease progression.  Therefore, she was restarted on Velcade, Revlimid, and Decadron.  She has restarted treatment in July 2022 with cycle 107 of weekly velcade. She tolerated well without any concerning adverse side effects.   She recently had a repeat multiple myeloma panel performed.  Dr. Julien Nordmann saw the patient today and reviewed the results of her myeloma panel.  Her labs showed stable disease.  Her labs today show a platelet count of 89,000.  Given her stable disease and worsening neuropathy.  Dr. Julien Nordmann believes it is reasonable to give the patient a 75-monthtreatment break.   We will cancel her infusion today.  We will arrange for a lab appointment in 3 months and a  follow-up visit 1 week later.   For her dysuria suprapubic discomfort and low back discomfort, these are similar symptoms to when she has a bladder infection.  We will arrange for UA and culture to be performed today.  I will call the patient with the results.  The only antibiotic allergy she reported was amoxicillin.  For the worsening neuropathy, we have sent in a prescription for gabapentin 100 mg 3 times daily.  The patient was advised to call immediately if she has any concerning symptoms in the interval. The patient voices understanding of current disease status and treatment options and is in agreement with the current care plan. All questions were answered. The patient knows to call the clinic with any problems, questions or concerns. We can certainly see the patient much sooner if necessary     Orders Placed This Encounter  Procedures   Culture, Urine    Standing Status:   Future    Standing Expiration Date:   06/06/2022   Urinalysis, Complete w Microscopic    Standing Status:   Future    Standing Expiration Date:   06/07/2022      CTobe SosHeilingoetter, PA-C 06/06/21  ADDENDUM: Hematology/Oncology Attending: I had a face-to-face encounter with the patient today.  I reviewed her records, lab and recommended her care plan.  This is a very pleasant 61years old white female with plasma cell neoplasm.  The patient has been on intermittent treatment with weekly subcutaneous Velcade, Revlimid and Decadron. She has been tolerating her treatment well except for increasing fatigue recently.  She also has more peripheral neuropathy. She had repeat myeloma panel performed recently.  I personally discussed the lab results with the patient and her the option of taking a  break off treatment for the next few months since she has improvement in her myeloma panel. The patient was interested in taking a break of treatment and we will see her back for follow-up visit in 3 months for  evaluation with repeat myeloma panel. For the peripheral neuropathy, we will start the patient on gabapentin 100 mg p.o. 3 times daily and would consider adjusting the dose in the future if needed. The patient was advised to call immediately if she has any other concerning symptoms in the interval.  The total time spent in the appointment was 30 minutes. Disclaimer: This note was dictated with voice recognition software. Similar sounding words can inadvertently be transcribed and may be missed upon review. Eilleen Kempf, MD

## 2021-06-06 ENCOUNTER — Telehealth: Payer: Self-pay | Admitting: Physician Assistant

## 2021-06-06 ENCOUNTER — Other Ambulatory Visit: Payer: Self-pay

## 2021-06-06 ENCOUNTER — Inpatient Hospital Stay: Payer: 59

## 2021-06-06 ENCOUNTER — Inpatient Hospital Stay: Payer: 59 | Admitting: Physician Assistant

## 2021-06-06 VITALS — BP 150/57 | HR 67 | Temp 97.8°F | Resp 17 | Wt 248.3 lb

## 2021-06-06 DIAGNOSIS — C9000 Multiple myeloma not having achieved remission: Secondary | ICD-10-CM | POA: Diagnosis not present

## 2021-06-06 DIAGNOSIS — Z5112 Encounter for antineoplastic immunotherapy: Secondary | ICD-10-CM | POA: Diagnosis not present

## 2021-06-06 LAB — CBC WITH DIFFERENTIAL (CANCER CENTER ONLY)
Abs Immature Granulocytes: 0.01 10*3/uL (ref 0.00–0.07)
Basophils Absolute: 0 10*3/uL (ref 0.0–0.1)
Basophils Relative: 1 %
Eosinophils Absolute: 0.1 10*3/uL (ref 0.0–0.5)
Eosinophils Relative: 2 %
HCT: 32.3 % — ABNORMAL LOW (ref 36.0–46.0)
Hemoglobin: 10.7 g/dL — ABNORMAL LOW (ref 12.0–15.0)
Immature Granulocytes: 0 %
Lymphocytes Relative: 23 %
Lymphs Abs: 0.6 10*3/uL — ABNORMAL LOW (ref 0.7–4.0)
MCH: 30.1 pg (ref 26.0–34.0)
MCHC: 33.1 g/dL (ref 30.0–36.0)
MCV: 90.7 fL (ref 80.0–100.0)
Monocytes Absolute: 0.3 10*3/uL (ref 0.1–1.0)
Monocytes Relative: 12 %
Neutro Abs: 1.7 10*3/uL (ref 1.7–7.7)
Neutrophils Relative %: 62 %
Platelet Count: 89 10*3/uL — ABNORMAL LOW (ref 150–400)
RBC: 3.56 MIL/uL — ABNORMAL LOW (ref 3.87–5.11)
RDW: 15.5 % (ref 11.5–15.5)
WBC Count: 2.8 10*3/uL — ABNORMAL LOW (ref 4.0–10.5)
nRBC: 0 % (ref 0.0–0.2)

## 2021-06-06 LAB — URINALYSIS, COMPLETE (UACMP) WITH MICROSCOPIC
Bacteria, UA: NONE SEEN
Bilirubin Urine: NEGATIVE
Glucose, UA: 50 mg/dL — AB
Ketones, ur: NEGATIVE mg/dL
Nitrite: NEGATIVE
Protein, ur: NEGATIVE mg/dL
Specific Gravity, Urine: 1.016 (ref 1.005–1.030)
pH: 6 (ref 5.0–8.0)

## 2021-06-06 LAB — CMP (CANCER CENTER ONLY)
ALT: 30 U/L (ref 0–44)
AST: 30 U/L (ref 15–41)
Albumin: 4 g/dL (ref 3.5–5.0)
Alkaline Phosphatase: 126 U/L (ref 38–126)
Anion gap: 9 (ref 5–15)
BUN: 12 mg/dL (ref 8–23)
CO2: 25 mmol/L (ref 22–32)
Calcium: 9.3 mg/dL (ref 8.9–10.3)
Chloride: 102 mmol/L (ref 98–111)
Creatinine: 0.77 mg/dL (ref 0.44–1.00)
GFR, Estimated: >60 mL/min (ref >60)
Glucose, Bld: 140 mg/dL — ABNORMAL HIGH (ref 70–99)
Potassium: 3.9 mmol/L (ref 3.5–5.1)
Sodium: 136 mmol/L (ref 135–145)
Total Bilirubin: 0.7 mg/dL (ref 0.3–1.2)
Total Protein: 6.7 g/dL (ref 6.5–8.1)

## 2021-06-06 MED ORDER — GABAPENTIN 100 MG PO CAPS: 300.0000 mg | ORAL_CAPSULE | Freq: Three times a day (TID) | ORAL | 2 refills | Status: DC

## 2021-06-06 NOTE — Telephone Encounter (Signed)
I called the patient to review the results of her UA. I also reviewed with Dr. Julien Nordmann. It does not look like she has a UTI. If new or worsening symptoms, can reach out to [place] for re-evaluation or follow up with her family doctor. She expressed understanding.

## 2021-06-07 LAB — URINE CULTURE: Culture: 40000 — AB

## 2021-06-08 ENCOUNTER — Other Ambulatory Visit: Payer: Self-pay | Admitting: Internal Medicine

## 2021-06-08 DIAGNOSIS — C9000 Multiple myeloma not having achieved remission: Secondary | ICD-10-CM

## 2021-06-13 ENCOUNTER — Other Ambulatory Visit: Payer: 59

## 2021-06-13 ENCOUNTER — Ambulatory Visit: Payer: 59

## 2021-06-23 ENCOUNTER — Encounter: Payer: Self-pay | Admitting: Medical Oncology

## 2021-06-26 ENCOUNTER — Other Ambulatory Visit: Payer: Self-pay

## 2021-06-26 DIAGNOSIS — C9000 Multiple myeloma not having achieved remission: Secondary | ICD-10-CM

## 2021-06-26 MED ORDER — REVLIMID 25 MG PO CAPS: ORAL_CAPSULE | ORAL | 1 refills | Status: DC

## 2021-06-28 ENCOUNTER — Telehealth: Payer: Self-pay | Admitting: Internal Medicine

## 2021-06-28 NOTE — Telephone Encounter (Signed)
Called patient regarding upcoming August and September appointments, left a voicemail.

## 2021-07-12 ENCOUNTER — Other Ambulatory Visit: Payer: Self-pay | Admitting: Physician Assistant

## 2021-07-12 DIAGNOSIS — C9000 Multiple myeloma not having achieved remission: Secondary | ICD-10-CM

## 2021-07-12 DIAGNOSIS — R110 Nausea: Secondary | ICD-10-CM

## 2021-07-20 ENCOUNTER — Encounter: Payer: Self-pay | Admitting: Gastroenterology

## 2021-07-31 ENCOUNTER — Other Ambulatory Visit: Payer: Self-pay

## 2021-08-08 ENCOUNTER — Other Ambulatory Visit: Payer: Self-pay

## 2021-08-15 ENCOUNTER — Ambulatory Visit (HOSPITAL_BASED_OUTPATIENT_CLINIC_OR_DEPARTMENT_OTHER): Payer: 59 | Admitting: Obstetrics & Gynecology

## 2021-08-18 ENCOUNTER — Encounter: Payer: Self-pay | Admitting: Gastroenterology

## 2021-08-18 ENCOUNTER — Ambulatory Visit: Payer: 59 | Admitting: Gastroenterology

## 2021-08-18 VITALS — BP 134/76 | HR 72 | Ht 66.5 in | Wt 249.5 lb

## 2021-08-18 DIAGNOSIS — K219 Gastro-esophageal reflux disease without esophagitis: Secondary | ICD-10-CM

## 2021-08-18 DIAGNOSIS — Z1211 Encounter for screening for malignant neoplasm of colon: Secondary | ICD-10-CM

## 2021-08-18 MED ORDER — PLENVU 140 G PO SOLR: 1.0000 | ORAL | 0 refills | Status: DC

## 2021-08-18 NOTE — Patient Instructions (Signed)
_______________________________________________________  If you are age 61 or older, your body mass index should be between 23-30. Your Body mass index is 39.67 kg/m. If this is out of the aforementioned range listed, please consider follow up with your Primary Care Provider.  If you are age 81 or younger, your body mass index should be between 19-25. Your Body mass index is 39.67 kg/m. If this is out of the aformentioned range listed, please consider follow up with your Primary Care Provider.   ________________________________________________________  The [facility] providers would like to encourage you to use [facility] to communicate with providers for non-urgent requests or questions.  Due to long hold times on the telephone, sending your provider a message by [facility] may be a faster and more efficient way to get a response.  Please allow 48 business hours for a response.  Please remember that this is for non-urgent requests.  _______________________________________________________   Dennis Bast have been scheduled for an endoscopy and colonoscopy. Please follow the written instructions given to you at your visit today. Please pick up your prep supplies at the pharmacy within the next 1-3 days. If you use inhalers (even only as needed), please bring them with you on the day of your procedure.   Due to recent changes in healthcare laws, you may see the results of your imaging and laboratory studies on MyChart before your provider has had a chance to review them.  We understand that in some cases there may be results that are confusing or concerning to you. Not all laboratory results come back in the same time frame and the provider may be waiting for multiple results in order to interpret others.  Please give [place] 48 hours in order for your provider to thoroughly review all the results before contacting the office for clarification of your results.    It was a pleasure to see you today!  Thank you for  trusting me with your gastrointestinal care!

## 2021-08-18 NOTE — Progress Notes (Signed)
[facility] Consult Note:  History: Brenda Conner 08/18/2021  Referring provider: [name], MD  Reason for consult/chief complaint: Colon Cancer Screening (Last colon 2010 Dr. Earlean Shawl, been having Cologuards (normal) due to going through chemo) and fam hx of colon polyps (Mother, sister)   Subjective  HPI:  Brenda Conner was here to talk with me about a screening colonoscopy. Last available colonoscopy report August 2010 by Dr. Earlean Shawl with indication of painless rectal bleeding -reportedly normal colon. Has had cologuards in the interim.  Brenda Conner has multiple myeloma, for which she is currently on a break in treatment as noted below. She tends to have frequent and sometimes loose stool, which has been the case for years.  This has been improved in the last several months off her myeloma therapy.  Occasional hemorrhoidal bleeding triggered by diarrhea.  She also reports having seen ENT some years ago for hoarseness that she believed was allergy related.  She was told there was probably a reflux component and was put on omeprazole 40 mg twice daily with reported improvement (unknown if also with concomitant allergy treatment). Her PCP decreased to 20 mg twice daily, and she never had heartburn even before starting this medicine.  She tried to cut it down to once a day and noticed return of the hoarseness. Denies dysphagia or odynophagia. ROS:  Review of Systems  Constitutional:  Positive for fatigue. Negative for appetite change and unexpected weight change.  HENT:  Negative for mouth sores and voice change.   Eyes:  Negative for pain and redness.  Respiratory:  Negative for cough and shortness of breath.   Cardiovascular:  Negative for chest pain and palpitations.  Genitourinary:  Negative for dysuria and hematuria.  Musculoskeletal:  Negative for arthralgias and myalgias.  Skin:  Negative for pallor and rash.  Neurological:  Negative for weakness and  headaches.  Hematological:  Negative for adenopathy.     Past Medical History: Past Medical History:  Diagnosis Date   Acid reflux    Anemia    Anxiety    Asthma    Cancer ([place])    waldenstroms/ macroglobinulemia   Depression    Depression    Diabetes mellitus without complication ([place])    Dysuria 02/28/2016   Gallstones    Heart palpitations    Hypercholesteremia    Hypertension    Hypothyroidism    Macroglobulinemia ([place])    ? 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 Her myeloma panel showed continuous increase in her free lambda light chain.    The patient previously underwent 107 cycles of subcutaneous weekly Velcade, as well as Revlimid and Decadron on and off over the last several years.   She had been off treatment since June 2021 until July 2022 in which she had evidence of disease progression.  Therefore, she was restarted on Velcade, Revlimid, and Decadron.  She has restarted treatment in July 2022 with cycle 107 of weekly velcade. She tolerated well without any concerning adverse side effects.    She recently had a repeat multiple myeloma panel performed.  Dr. Julien Nordmann saw the patient today and reviewed the results of  her myeloma panel.  Her labs showed stable disease.  Her labs today show a platelet count of 89,000.  Given her stable disease and worsening neuropathy.  Dr. Julien Nordmann believes it is reasonable to give the patient a 59-month treatment break. "   Past Surgical History: Past Surgical History:  Procedure Laterality Date   ABLATION  09/09/2007   HTA and polyp resection   BONE MARROW BIOPSY  04/08/2012   BONE MARROW BIOPSY  01/2020    FOOT SURGERY Bilateral 01/09/1996   small toe   KNEE ARTHROSCOPY W/ MENISCAL REPAIR Bilateral 10/10 ; 3/11   LAPAROSCOPIC CHOLECYSTECTOMY  01/09/1995   LUMBAR [place] SURGERY  03/08/2004   herniation, L4-L5   NASAL SINUS SURGERY  01/08/2002   UTERINE FIBROID EMBOLIZATION  2009   HTA and polyp resection     Family History: Family History  Problem Relation Age of Onset   Stroke Mother    Heart disease Mother        heart cath   Asthma Mother    Endometriosis Mother    Hypertension Mother    Hyperlipidemia Mother    Hyperlipidemia Father    Heart disease Father    Leukemia Maternal Grandmother    Heart disease Paternal Grandmother     Social History: Social History   Socioeconomic History   Marital status: Single    Spouse name: Not on file   Number of children: 0   Years of education: Not on file   Highest education level: Not on file  Occupational History   Occupation: Personal assistant  Tobacco Use   Smoking status: Never   Smokeless tobacco: Never  Vaping Use   Vaping Use: Never used  Substance and Sexual Activity   Alcohol use: No   Drug use: No   Sexual activity: Not Currently    Birth control/protection: Post-menopausal, Surgical  Other Topics Concern   Not on file  Social History Narrative   Not on file   Social Determinants of Health   Financial Resource Strain: Not on file  Food Insecurity: Not on file  Transportation Needs: Not on file  Physical Activity: Not on file  Stress: Not on file  Social Connections: Not on file    Allergies: Allergies  Allergen Reactions   Codeine Hives   Hydrocodone Nausea And Vomiting   Lortab [Hydrocodone-Acetaminophen] Nausea And Vomiting   Onion    Shellfish Allergy    Amoxicillin Rash    Outpatient Meds: Current Outpatient Medications  Medication Sig Dispense Refill   ACCU-CHEK AVIVA PLUS test strip USE TO CHECK SUGAR TWICE A DAY 90     ALPRAZolam (XANAX) 0.5 MG tablet Take 1 tablet (0.5 mg total) by mouth at  bedtime as needed for anxiety. 30 tablet 1   aspirin 81 MG chewable tablet Chew by mouth daily.     Azelastine HCl 0.15 % SOLN as needed.     B-D UF III MINI PEN NEEDLES 31G X 5 MM MISC      Blood Glucose Monitoring Suppl (ONE TOUCH ULTRA MINI) W/DEVICE KIT See admin instructions. Reported on 01/25/2015  0   cetirizine (ZYRTEC ALLERGY) 10 MG tablet Take 1 tablet by mouth daily.     Cetirizine HCl (ZYRTEC ALLERGY PO) Take by mouth daily.     DiphenhydrAMINE HCl (BENADRYL ALLERGY PO) Take 1 Dose by mouth daily at 6 (six) AM.     fexofenadine (ALLEGRA ALLERGY) 180 MG tablet Take 1 tablet by mouth daily.     Fexofenadine  HCl (ALLEGRA ALLERGY PO) Take by mouth daily.     Fluticasone-Salmeterol (ADVAIR) 100-50 MCG/DOSE AEPB Inhale 2 puffs into the lungs every 12 (twelve) hours.     gabapentin (NEURONTIN) 100 MG capsule Take 3 capsules (300 mg total) by mouth 3 (three) times daily. 90 capsule 2   ibuprofen (ADVIL,MOTRIN) 100 MG tablet Take 100 mg by mouth every 6 (six) hours as needed. Reported on 05/31/2015     Insulin Glargine (BASAGLAR KWIKPEN) 100 UNIT/ML Inject 20 Units into the skin at bedtime.     levothyroxine (SYNTHROID) 137 MCG tablet Take 137 mcg by mouth daily.     levothyroxine (SYNTHROID, LEVOTHROID) 150 MCG tablet Take 150 mcg by mouth daily before breakfast.     lisinopril (PRINIVIL,ZESTRIL) 10 MG tablet Take 10 mg by mouth daily. auth number 05/29/2018 [id number]  3   loperamide (IMODIUM) 2 MG capsule Take 2 mg by mouth as needed for diarrhea or loose stools.     magic mouthwash w/lidocaine SOLN Take 5 mLs by mouth 4 (four) times daily as needed for mouth pain. Swish, Gargle, and spit 240 mL 1   metFORMIN (GLUCOPHAGE-XR) 500 MG 24 hr tablet Take 500 mg by mouth daily.     mupirocin ointment (BACTROBAN) 2 % Apply 1 Application topically 2 (two) times daily.     NOVOLOG FLEXPEN 100 UNIT/ML FlexPen Inject 15 Units into the skin in the morning, at noon, and at bedtime. Per Sliding Scale      omeprazole (PRILOSEC) 20 MG capsule Take 20 mg by mouth 2 (two) times daily.     omeprazole (PRILOSEC) 40 MG capsule Take 40 mg by mouth 2 (two) times daily.      ondansetron (ZOFRAN-ODT) 8 MG disintegrating tablet Take 1 tablet (8 mg total) by mouth every 8 (eight) hours as needed. Reported on 05/31/2015 30 tablet 2   PEG-KCl-NaCl-NaSulf-Na Asc-C (PLENVU) 140 g SOLR Take 1 kit by mouth as directed. Use coupon: BIN: [id number] PNC: CNRX Group: [id number] ID: [id number] 1 each 0   pravastatin (PRAVACHOL) 40 MG tablet Take 40 mg by mouth every evening.      PROAIR HFA 108 (90 Base) MCG/ACT inhaler Reported on 06/21/2015  1   sertraline (ZOLOFT) 50 MG tablet Take 3 tablets (150 mg total) by mouth daily. 270 tablet 3   verapamil (CALAN-SR) 240 MG CR tablet Take 240 mg by mouth 2 (two) times daily.     acyclovir (ZOVIRAX) 400 MG tablet Take 1 tablet (400 mg total) by mouth 2 (two) times daily. (Patient not taking: Reported on 08/18/2021) 180 tablet 1   bortezomib IV (VELCADE) 3.5 MG injection Inject 1.3 mg/m2 into the vein once. (Patient not taking: Reported on 08/18/2021)     dexamethasone (DECADRON) 4 MG tablet 5 tablets p.o. weekly on the day of chemotherapy. (Patient not taking: Reported on 08/18/2021) 60 tablet 2   EPIPEN 2-PAK 0.3 MG/0.3ML SOAJ injection Reported on 06/21/2015 (Patient not taking: Reported on 12/20/2020)  1   REVLIMID 25 MG capsule TAKE 1 CAPSULE BY MOUTH ONCE DAILY 21 DAYS ON AND 7 DAYS OFF [employer] Auth # [id number]  Date Obtained 06/26/2021. Adult female NOT of childbearing potential (Patient not taking: Reported on 08/18/2021) 21 capsule 1   No current facility-administered medications for this visit.      ___________________________________________________________________ Objective   Exam:  BP 134/76 (BP Location: Left Arm, Patient Position: Sitting, Cuff Size: Large)   Pulse 72   Ht 5' 6.5" (1.689 m) Comment: height measured without  shoes  Wt 249 lb 8 oz (113.2 kg)   BMI 39.67  kg/m  Wt Readings from Last 3 Encounters:  08/18/21 249 lb 8 oz (113.2 kg)  06/06/21 248 lb 4.8 oz (112.6 kg)  05/23/21 251 lb 8 oz (114.1 kg)    General: Well-appearing, pleasant in conversation Eyes: sclera anicteric, no redness ENT: oral mucosa moist without lesions, no cervical or supraclavicular lymphadenopathy CV: Regular without murmur, no JVD, no peripheral edema Resp: clear to auscultation bilaterally, normal RR and effort noted GI: soft, no tenderness, with active bowel sounds. No guarding or palpable organomegaly noted. Skin; warm and dry, no rash or jaundice noted Neuro: awake, alert and oriented x 3. Normal gross motor function and fluent speech  Labs:     Latest Ref Rng & Units 06/06/2021    7:38 AM 05/30/2021   12:08 PM 05/23/2021    1:23 PM  CBC  WBC 4.0 - 10.5 K/uL 2.8  3.1  3.3   Hemoglobin 12.0 - 15.0 g/dL 10.7  10.8  10.8   Hematocrit 36.0 - 46.0 % 32.3  32.5  31.4   Platelets 150 - 400 K/uL 89  113  106       Latest Ref Rng & Units 06/06/2021    7:38 AM 05/30/2021   12:08 PM 05/23/2021    1:23 PM  CMP  Glucose 70 - 99 mg/dL 140  153  176   BUN 8 - 23 mg/dL _0 Creatinine 0.44 - 1.00 mg/dL 0.77  0.73  0.86   Sodium 135 - 145 mmol/L 136  133  134   Potassium 3.5 - 5.1 mmol/L 3.9  3.9  4.1   Chloride 98 - 111 mmol/L 102  99  104   CO2 22 - 32 mmol/L _1 Calcium 8.9 - 10.3 mg/dL 9.3  8.8  8.6   Total Protein 6.5 - 8.1 g/dL 6.7  7.0  6.7   Total Bilirubin 0.3 - 1.2 mg/dL 0.7  0.9  0.7   Alkaline Phos 38 - 126 U/L 126  126  136   AST 15 - 41 U/L 30  27  35   ALT 0 - 44 U/L _2 Assessment: Encounter Diagnoses  Name Primary?   Gastroesophageal reflux disease, unspecified whether esophagitis present Yes   Colon cancer screening     Average risk for colorectal cancer, and I recommended a screening colonoscopy.  She was agreeable after discussion of procedure and risks.  The benefits and risks of the planned procedure were  described in detail with the patient or (when appropriate) their health care proxy.  Risks were outlined as including, but not limited to, bleeding, infection, perforation, adverse medication reaction leading to cardiac or pulmonary decompensation, pancreatitis (if ERCP).  The limitation of incomplete mucosal visualization was also discussed.  No guarantees or warranties were given.  We have tentatively scheduled it for about 4 weeks from now, which will give time for repeat myeloma lab work to see if she needs to resume therapy for that, and also to see if her hemoglobin and platelet count are sufficient to undergo the colonoscopy.  (Considering sedation and polypectomy bleeding risks)  It sounds like she probably has underlying reflux, though with a typical symptom of hoarseness.  With years of that diagnosis, she should be screened for Barrett's esophagus.  Brenda Conner was agreeable to an upper endoscopy at  the same time as her colonoscopy.  I will copy this note to Dr. Earlie Server to see if her lab work could be done a week or 2 earlier than the end of the month as planned.  She also plans to message their office in the next few days.  Thank you for the courtesy of this consult.  Please call me with any questions or concerns.  Nelida Meuse III  CC: Referring provider noted above

## 2021-08-21 ENCOUNTER — Encounter: Payer: Self-pay | Admitting: Internal Medicine

## 2021-08-23 ENCOUNTER — Inpatient Hospital Stay: Payer: 59 | Attending: Physician Assistant

## 2021-08-23 ENCOUNTER — Other Ambulatory Visit: Payer: Self-pay

## 2021-08-23 DIAGNOSIS — C9030 Solitary plasmacytoma not having achieved remission: Secondary | ICD-10-CM | POA: Insufficient documentation

## 2021-08-23 DIAGNOSIS — C9000 Multiple myeloma not having achieved remission: Secondary | ICD-10-CM

## 2021-08-23 DIAGNOSIS — Z79899 Other long term (current) drug therapy: Secondary | ICD-10-CM | POA: Diagnosis not present

## 2021-08-23 LAB — CBC WITH DIFFERENTIAL (CANCER CENTER ONLY)
Abs Immature Granulocytes: 0.01 10*3/uL (ref 0.00–0.07)
Basophils Absolute: 0 10*3/uL (ref 0.0–0.1)
Basophils Relative: 1 %
Eosinophils Absolute: 0.1 10*3/uL (ref 0.0–0.5)
Eosinophils Relative: 2 %
HCT: 32.5 % — ABNORMAL LOW (ref 36.0–46.0)
Hemoglobin: 10.9 g/dL — ABNORMAL LOW (ref 12.0–15.0)
Immature Granulocytes: 0 %
Lymphocytes Relative: 17 %
Lymphs Abs: 0.7 10*3/uL (ref 0.7–4.0)
MCH: 29.5 pg (ref 26.0–34.0)
MCHC: 33.5 g/dL (ref 30.0–36.0)
MCV: 87.8 fL (ref 80.0–100.0)
Monocytes Absolute: 0.3 10*3/uL (ref 0.1–1.0)
Monocytes Relative: 8 %
Neutro Abs: 3 10*3/uL (ref 1.7–7.7)
Neutrophils Relative %: 72 %
Platelet Count: 101 10*3/uL — ABNORMAL LOW (ref 150–400)
RBC: 3.7 MIL/uL — ABNORMAL LOW (ref 3.87–5.11)
RDW: 13.6 % (ref 11.5–15.5)
WBC Count: 4.1 10*3/uL (ref 4.0–10.5)
nRBC: 0 % (ref 0.0–0.2)

## 2021-08-23 LAB — CMP (CANCER CENTER ONLY)
ALT: 20 U/L (ref 0–44)
AST: 28 U/L (ref 15–41)
Albumin: 4.4 g/dL (ref 3.5–5.0)
Alkaline Phosphatase: 126 U/L (ref 38–126)
Anion gap: 7 (ref 5–15)
BUN: 8 mg/dL (ref 8–23)
CO2: 26 mmol/L (ref 22–32)
Calcium: 9.6 mg/dL (ref 8.9–10.3)
Chloride: 102 mmol/L (ref 98–111)
Creatinine: 0.66 mg/dL (ref 0.44–1.00)
GFR, Estimated: >60 mL/min (ref >60)
Glucose, Bld: 129 mg/dL — ABNORMAL HIGH (ref 70–99)
Potassium: 4 mmol/L (ref 3.5–5.1)
Sodium: 135 mmol/L (ref 135–145)
Total Bilirubin: 0.7 mg/dL (ref 0.3–1.2)
Total Protein: 7.4 g/dL (ref 6.5–8.1)

## 2021-08-24 ENCOUNTER — Telehealth: Payer: Self-pay | Admitting: Gastroenterology

## 2021-08-24 LAB — KAPPA/LAMBDA LIGHT CHAINS
Kappa free light chain: 14 mg/L (ref 3.3–19.4)
Kappa, lambda light chain ratio: 0.02 — ABNORMAL LOW (ref 0.26–1.65)
Lambda free light chains: 607.1 mg/L — ABNORMAL HIGH (ref 5.7–26.3)

## 2021-08-24 LAB — IGG, IGA, IGM
IgA: 137 mg/dL (ref 87–352)
IgG (Immunoglobin G), Serum: 568 mg/dL — ABNORMAL LOW (ref 586–1602)
IgM (Immunoglobulin M), Srm: 451 mg/dL — ABNORMAL HIGH (ref 26–217)

## 2021-08-24 LAB — BETA 2 MICROGLOBULIN, SERUM: Beta-2 Microglobulin: 1.9 mg/L (ref 0.6–2.4)

## 2021-08-24 NOTE — Telephone Encounter (Signed)
[place],   I saw this patient in the office last week, and an EGD/colon was scheduled for 09/19/2021 in the [facility].  Her platelet count has improved since she has been off therapy for myeloma, and I have been communicating with Dr. Earlie Server.  He has not yet received all of her myeloma-specific lab results to determine whether or not she may need to go back on therapy.  In the event that that occurs, I think it would be best to move her procedures to an earlier date.  Please contact [facility] today or tomorrow and offer that to her.  I believe I have sufficient openings in my schedule in the next few weeks to accommodate that.  If you are having difficulty finding a slot, let me know so we can see what accommodations might be needed.  Thank you  HD

## 2021-08-24 NOTE — Telephone Encounter (Signed)
Patient returned your call. Please call back to advise.

## 2021-08-24 NOTE — Telephone Encounter (Signed)
Returned call to patient. We reviewed Dr. Loletha Carrow' recommendations. Pt is able to make appt on 09/07/21 at 2:30 pm. Pt is aware that she will need to arrive by 1:30 pm with a care partner. Pt confirms that she does have access to MyChart. I informed patient that I will send her updated instructions. Pt verbalized understanding and had no concerns at the end of the call.   Updated ECL instructions sent to patient via MyChart.

## 2021-08-24 NOTE — Telephone Encounter (Signed)
Lm on vm for patient to return call to discuss Dr. Loletha Carrow' recommendations and moving her procedures up sooner. I told pt that I could move her procedures to Thursday, 09/07/21 at 2:30 pm, 1:30 pm arrival time. I asked that patient give me a call back or send me a MyChart message.

## 2021-08-31 ENCOUNTER — Other Ambulatory Visit: Payer: Self-pay

## 2021-08-31 ENCOUNTER — Ambulatory Visit: Payer: 59 | Admitting: Physician Assistant

## 2021-08-31 ENCOUNTER — Inpatient Hospital Stay: Payer: 59 | Admitting: Physician Assistant

## 2021-08-31 VITALS — BP 122/66 | HR 71 | Temp 97.9°F | Resp 16 | Wt 251.2 lb

## 2021-08-31 DIAGNOSIS — C9030 Solitary plasmacytoma not having achieved remission: Secondary | ICD-10-CM | POA: Diagnosis not present

## 2021-08-31 DIAGNOSIS — C9000 Multiple myeloma not having achieved remission: Secondary | ICD-10-CM

## 2021-08-31 NOTE — Progress Notes (Signed)
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  PRIOR THERAPY: 1) Velcade 1.3 MG/M2 subcutaneously with Decadron 40 mg by mouth on a weekly basis. First cycle 11/24/2013. She status post 31 weekly doses of treatment. 2) Velcade 1.3 MG/M2 subcutaneously and weekly basis with Decadron 40 mg by mouth weekly. First dose 02/01/2015. Status post 28 cycles. 3) Revlimid 25 mg by mouth daily for 21 days every 4 weeks with weekly Decadron 20 mg. started in 11/27/2015. Status post 3 cycles discontinued secondary to lack of response.  CURRENT THERAPY:  Systemic treatment with Velcade 1.3 MG/KG weekly, Revlimid 25 mg by mouth daily for 21 days every 4 weeks in addition to Decadron 20 mg by mouth weekly. First dose 03/06/2016. She has a break off treatment from June 2021 until July 2022 after cycle #105. Resumed July 19, 2020. She has been on a break since May 2023  INTERVAL HISTORY: Brenda Conner 61 y.o. female returns to clinic today for follow-up visit.  The patient is feeling fairly well today without any concerning complaints.  She was last seen in the clinic on 06/06/2021.  At that point in time, the patient was started on a break from her treatment with Revlimid, Velcade, and Decadron.  She has been feeling fairly well since last being seen.  She does mention she is due for a EGD and colonoscopy next week.  She denies any fever, chills, night sweats, or unexplained weight loss.  Denies any signs or symptoms of infection including sore throat, nasal congestion, cough, shortness of breath, or skin infections.  Denies any changes in her baseline bowel habits.  Denies any nausea, vomiting.  Denies any abnormal bleeding or bruising.  She recently had a repeat myeloma panel performed.  She is here today for  evaluation and to review her lab results.     MEDICAL HISTORY: Past Medical History:  Diagnosis Date   Acid reflux    Anemia    Anxiety    Asthma    Cancer ([facility])    waldenstroms/ macroglobinulemia   Depression    Depression    Diabetes mellitus without complication ([facility])    Dysuria 02/28/2016   Gallstones    Heart palpitations    Hypercholesteremia    Hypertension    Hypothyroidism    Macroglobulinemia ([facility])    ? POEMS syndrome   Monoclonal gammopathy of unknown significance (MGUS)    Multiple myeloma ([facility])    Obesity    POEMS syndrome    PONV (postoperative nausea and vomiting)    Sleep apnea    CPAP at bedtime    ALLERGIES:  is allergic to codeine, hydrocodone, lortab [hydrocodone-acetaminophen], onion, shellfish allergy, and amoxicillin.  MEDICATIONS:  Current Outpatient Medications  Medication Sig Dispense Refill   ACCU-CHEK AVIVA PLUS test strip USE TO CHECK SUGAR TWICE A DAY 90     ALPRAZolam (XANAX) 0.5 MG tablet Take 1 tablet (0.5 mg total) by mouth at bedtime as needed for anxiety. 30 tablet 1   aspirin 81 MG chewable tablet Chew by mouth daily.     Azelastine HCl 0.15 % SOLN as needed.     B-D UF III MINI PEN NEEDLES 31G X 5 MM MISC      Blood Glucose Monitoring Suppl (ONE TOUCH ULTRA MINI) W/DEVICE KIT See admin  instructions. Reported on 01/25/2015  0   cetirizine (ZYRTEC ALLERGY) 10 MG tablet Take 1 tablet by mouth daily.     DiphenhydrAMINE HCl (BENADRYL ALLERGY PO) Take 1 Dose by mouth daily at 6 (six) AM.     fexofenadine (ALLEGRA ALLERGY) 180 MG tablet Take 1 tablet by mouth daily.     Fexofenadine HCl (ALLEGRA ALLERGY PO) Take by mouth daily.     Fluticasone-Salmeterol (ADVAIR) 100-50 MCG/DOSE AEPB Inhale 2 puffs into the lungs every 12 (twelve) hours.     gabapentin (NEURONTIN) 100 MG capsule Take 3 capsules (300 mg total) by mouth 3 (three) times daily. 90 capsule 2   ibuprofen (ADVIL,MOTRIN) 100 MG tablet Take 100 mg by mouth every 6 (six) hours  as needed. Reported on 05/31/2015     Insulin Glargine (BASAGLAR KWIKPEN) 100 UNIT/ML Inject 20 Units into the skin at bedtime.     levothyroxine (SYNTHROID) 137 MCG tablet Take 137 mcg by mouth daily.     lisinopril (PRINIVIL,ZESTRIL) 10 MG tablet Take 10 mg by mouth daily. auth number 05/29/2018 [id number]  3   loperamide (IMODIUM) 2 MG capsule Take 2 mg by mouth as needed for diarrhea or loose stools.     magic mouthwash w/lidocaine SOLN Take 5 mLs by mouth 4 (four) times daily as needed for mouth pain. Swish, Gargle, and spit 240 mL 1   metFORMIN (GLUCOPHAGE-XR) 500 MG 24 hr tablet Take 500 mg by mouth daily.     mupirocin ointment (BACTROBAN) 2 % Apply 1 Application topically 2 (two) times daily.     NOVOLOG FLEXPEN 100 UNIT/ML FlexPen Inject 15 Units into the skin in the morning, at noon, and at bedtime. Per Sliding Scale     omeprazole (PRILOSEC) 20 MG capsule Take 20 mg by mouth 2 (two) times daily.     ondansetron (ZOFRAN-ODT) 8 MG disintegrating tablet Take 1 tablet (8 mg total) by mouth every 8 (eight) hours as needed. Reported on 05/31/2015 30 tablet 2   PEG-KCl-NaCl-NaSulf-Na Asc-C (PLENVU) 140 g SOLR Take 1 kit by mouth as directed. Use coupon: BIN: 299371 PNC: CNRX Group: [id number] ID: [id number] 1 each 0   pravastatin (PRAVACHOL) 80 MG tablet Take 80 mg by mouth daily.     PROAIR HFA 108 (90 Base) MCG/ACT inhaler Reported on 06/21/2015  1   REVLIMID 25 MG capsule TAKE 1 CAPSULE BY MOUTH ONCE DAILY 21 DAYS ON AND 7 DAYS OFF [employer] Auth # [id number]  Date Obtained 06/26/2021. Adult female NOT of childbearing potential 21 capsule 1   sertraline (ZOLOFT) 50 MG tablet Take 3 tablets (150 mg total) by mouth daily. 270 tablet 3   verapamil (CALAN-SR) 240 MG CR tablet Take 240 mg by mouth 2 (two) times daily.     acyclovir (ZOVIRAX) 400 MG tablet Take 1 tablet (400 mg total) by mouth 2 (two) times daily. (Patient not taking: Reported on 08/18/2021) 180 tablet 1   bortezomib IV (VELCADE) 3.5 MG  injection Inject 1.3 mg/m2 into the vein once. (Patient not taking: Reported on 08/18/2021)     dexamethasone (DECADRON) 4 MG tablet 5 tablets p.o. weekly on the day of chemotherapy. (Patient not taking: Reported on 08/18/2021) 60 tablet 2   EPIPEN 2-PAK 0.3 MG/0.3ML SOAJ injection Reported on 06/21/2015 (Patient not taking: Reported on 12/20/2020)  1   No current facility-administered medications for this visit.    SURGICAL HISTORY:  Past Surgical History:  Procedure Laterality Date   ABLATION  09/09/2007   HTA and  polyp resection   BONE MARROW BIOPSY  04/08/2012   BONE MARROW BIOPSY  01/2020   FOOT SURGERY Bilateral 01/09/1996   small toe   KNEE ARTHROSCOPY W/ MENISCAL REPAIR Bilateral 10/10 ; 3/11   LAPAROSCOPIC CHOLECYSTECTOMY  01/09/1995   LUMBAR [place] SURGERY  03/08/2004   herniation, L4-L5   NASAL SINUS SURGERY  01/08/2002   UTERINE FIBROID EMBOLIZATION  2009   HTA and polyp resection    REVIEW OF SYSTEMS:   Review of Systems  Constitutional: Negative for appetite change, chills, fatigue, fever and unexpected weight change.  HENT: Negative for mouth sores, nosebleeds, sore throat and trouble swallowing.   Eyes: Negative for eye problems and icterus.  Respiratory: Negative for cough, hemoptysis, shortness of breath and wheezing.   Cardiovascular: Negative for chest pain and leg swelling.  Gastrointestinal: Negative for abdominal pain, constipation, diarrhea, nausea and vomiting.  Genitourinary: Negative for bladder incontinence, difficulty urinating, dysuria, frequency and hematuria.   Musculoskeletal: Negative for back pain, gait problem, neck pain and neck stiffness.  Skin: Negative for itching and rash.  Neurological: Negative for dizziness, extremity weakness, gait problem, headaches, light-headedness and seizures.  Hematological: Negative for adenopathy. Does not bruise/bleed easily.  Psychiatric/Behavioral: Negative for confusion, depression and sleep disturbance. The  patient is not nervous/anxious.     PHYSICAL EXAMINATION:  Blood pressure 122/66, pulse 71, temperature 97.9 F (36.6 C), temperature source Oral, resp. rate 16, weight 251 lb 3.2 oz (113.9 kg), SpO2 100 %.  ECOG PERFORMANCE STATUS: 1  Physical Exam  Constitutional: Oriented to person, place, and time and well-developed, well-nourished, and in no distress. No distress.  HENT:  Head: Normocephalic and atraumatic.  Mouth/Throat: Oropharynx is clear and moist. No oropharyngeal exudate.  Eyes: Conjunctivae are normal. Right eye exhibits no discharge. Left eye exhibits no discharge. No scleral icterus.  Neck: Normal range of motion. Neck supple.  Cardiovascular: Normal rate, regular rhythm, normal heart sounds and intact distal pulses.   Pulmonary/Chest: Effort normal and breath sounds normal. No respiratory distress. No wheezes. No rales.  Abdominal: Soft. Bowel sounds are normal. Exhibits no distension and no mass. There is no tenderness.  Musculoskeletal: Normal range of motion. Exhibits no edema.  Lymphadenopathy:    No cervical adenopathy.  Neurological: Alert and oriented to person, place, and time. Exhibits normal muscle tone. Gait normal. Coordination normal.  Skin: Skin is warm and dry. No rash noted. Not diaphoretic. No erythema. No pallor.  Psychiatric: Mood, memory and judgment normal.  Vitals reviewed.  LABORATORY DATA: Lab Results  Component Value Date   WBC 4.1 08/23/2021   HGB 10.9 (L) 08/23/2021   HCT 32.5 (L) 08/23/2021   MCV 87.8 08/23/2021   PLT 101 (L) 08/23/2021      Chemistry      Component Value Date/Time   NA 135 08/23/2021 1042   NA 138 11/14/2016 0824   K 4.0 08/23/2021 1042   K 4.3 11/14/2016 0824   CL 102 08/23/2021 1042   CL 104 04/07/2012 1415   CO2 26 08/23/2021 1042   CO2 23 11/14/2016 0824   BUN 8 08/23/2021 1042   BUN 9.1 11/14/2016 0824   CREATININE 0.66 08/23/2021 1042   CREATININE 0.7 11/14/2016 0824      Component Value Date/Time    CALCIUM 9.6 08/23/2021 1042   CALCIUM 9.6 11/14/2016 0824   ALKPHOS 126 08/23/2021 1042   ALKPHOS 94 11/14/2016 0824   AST 28 08/23/2021 1042   AST 28 11/14/2016 0824   ALT  20 08/23/2021 1042   ALT 24 11/14/2016 0824   BILITOT 0.7 08/23/2021 1042   BILITOT 0.41 11/14/2016 0824       RADIOGRAPHIC STUDIES:  No results found.  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 Her myeloma panel showed continuous increase in her free lambda light chain.    The patient previously underwent 107 cycles of subcutaneous weekly Velcade, as well as Revlimid and Decadron on and off over the last several years.   She had been off treatment since June 2021 until July 2022 in which she had evidence of disease progression.  Therefore, she was restarted on Velcade, Revlimid, and Decadron.  She has restarted treatment in July 2022 with cycle 107 of weekly velcade. She tolerated well without any concerning adverse side effects.  She was given a 66-monthtreatment break starting in May 2023.  She recently had a repeat myeloma panel performed.  Dr. MJulien Nordmannpersonally and independently reviewed the results with the patient discussed results with the patient today.  Her labs were fairly stable except for some increase in the lambda free light chains.  Dr. MJulien Nordmannfeels comfortable having the patient continue on observation with repeat labs in 3 months.  The patient is in agreement with this plan and this allows her to have her health maintenance evaluations by her other providers.   We will arrange for a lab appointment in 3 months and a follow-up visit 1 week later.   The patient was advised to call immediately if she has any concerning symptoms in the interval. The patient voices understanding of current disease status and treatment options and is in agreement with the current care plan. All questions were answered.  The patient knows to call the clinic with any problems, questions or concerns. We can certainly see the patient much sooner if necessary    Orders Placed This Encounter  Procedures   Lactate dehydrogenase (LDH)    Standing Status:   Future    Standing Expiration Date:   08/31/2022   CBC with Differential [place]    Standing Status:   Future    Standing Expiration Date:   09/01/2022   CMP ([facility])    Standing Status:   Future    Standing Expiration Date:   09/01/2022       CTobe SosHeilingoetter, PA-C 08/31/21  ADDENDUM: Hematology/Oncology Attending: I had a face-to-face encounter with the patient today.  I reviewed his record, lab and recommended her care plan.  This is a very pleasant 61years old white female with plasma cell dyscrasia initially diagnosis as MGUS in September 2010 with more symptoms suggestive of POEMS syndrome. She has been on treatment with subcutaneous Velcade, Revlimid and Decadron intermittently for several years.  The patient is currently on a break off treatment since May 2023 and she is feeling fine.  She is scheduled to have a colonoscopy next week. She had repeat myeloma panel performed recently.  I discussed the lab result with the patient and there was further increase in the free lambda light chain but the patient is currently asymptomatic with no significant renal insufficiency but has mild anemia. I recommended for her to proceed with colonoscopy as planned. I will continue the patient on observation for now with repeat myeloma panel in 3 months. She was advised to call immediately if she has any concerning symptoms in the interval. The total time spent in the  appointment was 20 minutes. Disclaimer: This Conner was dictated with voice recognition software. Similar sounding words can inadvertently be transcribed and may be missed upon review. Eilleen Kempf, MD

## 2021-09-01 ENCOUNTER — Other Ambulatory Visit: Payer: Self-pay

## 2021-09-05 ENCOUNTER — Other Ambulatory Visit: Payer: 59

## 2021-09-07 ENCOUNTER — Ambulatory Visit (AMBULATORY_SURGERY_CENTER): Payer: 59 | Admitting: Gastroenterology

## 2021-09-07 ENCOUNTER — Encounter: Payer: Self-pay | Admitting: Gastroenterology

## 2021-09-07 VITALS — BP 115/61 | HR 60 | Temp 95.7°F | Resp 12 | Ht 66.0 in | Wt 249.0 lb

## 2021-09-07 DIAGNOSIS — K317 Polyp of stomach and duodenum: Secondary | ICD-10-CM

## 2021-09-07 DIAGNOSIS — R197 Diarrhea, unspecified: Secondary | ICD-10-CM | POA: Diagnosis not present

## 2021-09-07 DIAGNOSIS — Z1211 Encounter for screening for malignant neoplasm of colon: Secondary | ICD-10-CM

## 2021-09-07 DIAGNOSIS — K219 Gastro-esophageal reflux disease without esophagitis: Secondary | ICD-10-CM

## 2021-09-07 DIAGNOSIS — K2950 Unspecified chronic gastritis without bleeding: Secondary | ICD-10-CM | POA: Diagnosis not present

## 2021-09-07 DIAGNOSIS — K2960 Other gastritis without bleeding: Secondary | ICD-10-CM | POA: Diagnosis not present

## 2021-09-07 MED ORDER — SODIUM CHLORIDE 0.9 % IV SOLN: 500.0000 mL | INTRAVENOUS | Status: DC

## 2021-09-07 NOTE — Op Note (Signed)
[facility] Patient Name: Anniebell Bedore Procedure Date: 09/07/2021 2:07 PM MRN: [id number] Endoscopist: [place]. Loletha Carrow , MD Age: 61 Referring MD:  Date of Birth: 1960/09/08 Gender: Female Account #: [id number] Procedure:                Upper GI endoscopy Indications:              Screening for Barrett's esophagus in patient at                            risk for this condition                           PPI for many years for suspected GERD with                            hoarseness. symptoms return with weaning of PPI. Medicines:                Monitored Anesthesia Care Procedure:                Pre-Anesthesia Assessment:                           - Prior to the procedure, a History and Physical                            was performed, and patient medications and                            allergies were reviewed. The patient's tolerance of                            previous anesthesia was also reviewed. The risks                            and benefits of the procedure and the sedation                            options and risks were discussed with the patient.                            All questions were answered, and informed consent                            was obtained. Prior Anticoagulants: The patient has                            taken no previous anticoagulant or antiplatelet                            agents. ASA Grade Assessment: III - A patient with                            severe systemic disease. After reviewing the risks  and benefits, the patient was deemed in                            satisfactory condition to undergo the procedure.                           - Prior to the procedure, a History and Physical                            was performed, and patient medications and                            allergies were reviewed. The patient's tolerance of                            previous anesthesia was also reviewed. The  risks                            and benefits of the procedure and the sedation                            options and risks were discussed with the patient.                            All questions were answered, and informed consent                            was obtained. Prior Anticoagulants: The patient has                            taken no previous anticoagulant or antiplatelet                            agents. ASA Grade Assessment: III - A patient with                            severe systemic disease. After reviewing the risks                            and benefits, the patient was deemed in                            satisfactory condition to undergo the procedure.                           After obtaining informed consent, the endoscope was                            passed under direct vision. Throughout the                            procedure, the patient's blood pressure, pulse, and  oxygen saturations were monitored continuously. The                            Endoscope was introduced through the mouth, and                            advanced to the second part of duodenum. The upper                            GI endoscopy was accomplished without difficulty.                            The patient tolerated the procedure fairly well. Scope In: Scope Out: Findings:                 The larynx was normal.                           There is no endoscopic evidence of Barrett's                            esophagus, esophagitis, hiatal hernia or stricture                            in the entire esophagus.                           A single 6 mm sessile polyp with no stigmata of                            recent bleeding was found in the cardia. The polyp                            was removed with a piecemeal technique using a cold                            biopsy forceps. Resection and retrieval were                            complete.                            Patchy mucosal changes characterized by striped                            erythema were found in the gastric antrum. Several                            biopsies were obtained on the greater curvature of                            the gastric body, on the lesser curvature of the  gastric body, on the greater curvature of the                            gastric antrum and on the lesser curvature of the                            gastric antrum with cold forceps for histology.                            (r/o H pylori). The gastric mucosa was also                            diffusely cobblestoned.                           The cardia and gastric fundus were normal on                            retroflexion.(other than polyp noted above) - Hill                            grade 2                           The examined duodenum was normal. Complications:            No immediate complications. Estimated Blood Loss:     Estimated blood loss was minimal. Impression:               - Normal larynx.                           - A single gastric polyp. Resected and retrieved.                           - Erythematous mucosa in the antrum.                           - Normal examined duodenum.                           - Several biopsies were obtained on the greater                            curvature of the gastric body, on the lesser                            curvature of the gastric body, on the greater                            curvature of the gastric antrum and on the lesser                            curvature of the gastric antrum. Recommendation:           - Patient has a contact number  available for                            emergencies. The signs and symptoms of potential                            delayed complications were discussed with the                            patient. Return to normal activities tomorrow.                            Written  discharge instructions were provided to the                            patient.                           - Resume previous diet.                           - Continue present medications.                           - Await pathology results.                           - See the other procedure note for documentation of                            additional recommendations. Stephan Draughn L. Loletha Carrow, MD 09/07/2021 2:50:55 PM This report has been signed electronically.

## 2021-09-07 NOTE — Progress Notes (Signed)
Called to room to assist during endoscopic procedure.  Patient ID and intended procedure confirmed with present staff. Received instructions for my participation in the procedure from the performing physician.  

## 2021-09-07 NOTE — Patient Instructions (Signed)
YOU HAD AN ENDOSCOPIC PROCEDURE TODAY: Refer to the procedure report and other information in the discharge instructions given to you for any specific questions about what was found during the examination. If this information does not answer your questions, please call [facility] office at [phone] to clarify.  ° °YOU SHOULD EXPECT: Some feelings of bloating in the abdomen. Passage of more gas than usual. Walking can help get rid of the air that was put into your GI tract during the procedure and reduce the bloating. If you had a lower endoscopy (such as a colonoscopy or flexible sigmoidoscopy) you may notice spotting of blood in your stool or on the toilet paper. Some abdominal soreness may be present for a day or two, also. ° °DIET: Your first meal following the procedure should be a light meal and then it is ok to progress to your normal diet. A half-sandwich or bowl of soup is an example of a good first meal. Heavy or fried foods are harder to digest and may make you feel nauseous or bloated. Drink plenty of fluids but you should avoid alcoholic beverages for 24 hours. If you had a esophageal dilation, please see attached instructions for diet.   ° °ACTIVITY: Your care partner should take you home directly after the procedure. You should plan to take it easy, moving slowly for the rest of the day. You can resume normal activity the day after the procedure however YOU SHOULD NOT DRIVE, use power tools, machinery or perform tasks that involve climbing or major physical exertion for 24 hours (because of the sedation medicines used during the test).  ° °SYMPTOMS TO REPORT IMMEDIATELY: °A gastroenterologist can be reached at any hour. Please call [phone]  for any of the following symptoms:  °Following lower endoscopy (colonoscopy, flexible sigmoidoscopy) °Excessive amounts of blood in the stool  °Significant tenderness, worsening of abdominal pains  °Swelling of the abdomen that is new, acute  °Fever of 100° or  higher  °Following upper endoscopy (EGD, EUS, ERCP, esophageal dilation) °Vomiting of blood or coffee ground material  °New, significant abdominal pain  °New, significant chest pain or pain under the shoulder blades  °Painful or persistently difficult swallowing  °New shortness of breath  °Black, tarry-looking or red, bloody stools ° °FOLLOW UP:  °If any biopsies were taken you will be contacted by phone or by letter within the next 1-3 weeks. Call [phone]  if you have not heard about the biopsies in 3 weeks.  °Please also call with any specific questions about appointments or follow up tests. ° °

## 2021-09-07 NOTE — Progress Notes (Signed)
Pt's states no medical or surgical changes since previsit or office visit. 

## 2021-09-07 NOTE — Progress Notes (Signed)
No changes to clinical history since GI office visit on 08/18/21.  The patient is appropriate for an endoscopic procedure in the ambulatory setting.  - Wilfrid Lund, MD

## 2021-09-07 NOTE — Progress Notes (Signed)
Pt in recovery with monitors in place, VSS. Report given to receiving RN. Bite guard was placed with pt awake to ensure comfort. No dental or soft tissue damage noted. 

## 2021-09-07 NOTE — Op Note (Signed)
[facility] Patient Name: Brenda Conner Procedure Date: 09/07/2021 2:02 PM MRN: [id number] Endoscopist: [place]. Loletha Carrow , MD Age: 61 Referring MD:  Date of Birth: 11/14/1960 Gender: Female Account #: [id number] Procedure:                Colonoscopy Indications:              Screening for colorectal malignant neoplasm,                            Incidental diarrhea noted (improves during periods                            off myeloma therapy)                           last colonoscopy 2010, no polyps. normal cologuards                            in the interim Medicines:                Monitored Anesthesia Care Procedure:                Pre-Anesthesia Assessment:                           - Prior to the procedure, a History and Physical                            was performed, and patient medications and                            allergies were reviewed. The patient's tolerance of                            previous anesthesia was also reviewed. The risks                            and benefits of the procedure and the sedation                            options and risks were discussed with the patient.                            All questions were answered, and informed consent                            was obtained. Prior Anticoagulants: The patient has                            taken no previous anticoagulant or antiplatelet                            agents. ASA Grade Assessment: III - A patient with  severe systemic disease. After reviewing the risks                            and benefits, the patient was deemed in                            satisfactory condition to undergo the procedure.                           After obtaining informed consent, the colonoscope                            was passed under direct vision. Throughout the                            procedure, the patient's blood pressure, pulse, and                             oxygen saturations were monitored continuously. The                            Olympus CF-HQ190L [id number]) Colonoscope was                            introduced through the anus and advanced to the the                            cecum, identified by appendiceal orifice and                            ileocecal valve. The colonoscopy was performed                            without difficulty. The patient tolerated the                            procedure well. The quality of the bowel                            preparation was excellent. The ileocecal valve,                            appendiceal orifice, and rectum were photographed. Scope In: 2:12:28 PM Scope Out: 2:27:39 PM Scope Withdrawal Time: 0 hours 10 minutes 9 seconds  Total Procedure Duration: 0 hours 15 minutes 11 seconds  Findings:                 The perianal and digital rectal examinations were                            normal.                           There is no endoscopic evidence of polyps in the  entire colon.                           Repeat examination of right colon under NBI                            performed.                           Normal mucosa was found in the entire colon.                            Biopsies for histology were taken with a cold                            forceps for evaluation of microscopic colitis.                           Internal hemorrhoids were found. The hemorrhoids                            were small.                           The exam was otherwise without abnormality on                            direct and retroflexion views. Complications:            No immediate complications. Estimated Blood Loss:     Estimated blood loss was minimal. Impression:               - Normal mucosa in the entire examined colon.                            Biopsied.                           - Internal hemorrhoids.                           - The examination was  otherwise normal on direct                            and retroflexion views. Recommendation:           - Patient has a contact number available for                            emergencies. The signs and symptoms of potential                            delayed complications were discussed with the                            patient. Return to normal activities tomorrow.  Written discharge instructions were provided to the                            patient.                           - Resume previous diet.                           - Continue present medications.                           - Await pathology results.                           - Repeat colonoscopy in 10 years for screening                            purposes.                           - See the other procedure note for documentation of                            additional recommendations. Davin Muramoto L. Loletha Carrow, MD 09/07/2021 2:31:28 PM This report has been signed electronically.

## 2021-09-08 ENCOUNTER — Telehealth: Payer: Self-pay | Admitting: *Deleted

## 2021-09-08 NOTE — Telephone Encounter (Signed)
Post procedure follow up call placed, no answer and left VM.  

## 2021-09-12 ENCOUNTER — Ambulatory Visit: Payer: 59 | Admitting: Internal Medicine

## 2021-09-13 ENCOUNTER — Other Ambulatory Visit (HOSPITAL_BASED_OUTPATIENT_CLINIC_OR_DEPARTMENT_OTHER): Payer: Self-pay | Admitting: Obstetrics & Gynecology

## 2021-09-13 ENCOUNTER — Encounter: Payer: Self-pay | Admitting: Gastroenterology

## 2021-09-13 DIAGNOSIS — F419 Anxiety disorder, unspecified: Secondary | ICD-10-CM

## 2021-09-14 ENCOUNTER — Other Ambulatory Visit: Payer: 59

## 2021-09-18 ENCOUNTER — Telehealth: Payer: Self-pay | Admitting: Internal Medicine

## 2021-09-18 NOTE — Telephone Encounter (Signed)
Called patient regarding upcoming November appointment, patient is notified.  

## 2021-09-19 ENCOUNTER — Encounter: Payer: 59 | Admitting: Gastroenterology

## 2021-09-21 ENCOUNTER — Ambulatory Visit: Payer: 59 | Admitting: Physician Assistant

## 2021-10-02 ENCOUNTER — Ambulatory Visit (HOSPITAL_BASED_OUTPATIENT_CLINIC_OR_DEPARTMENT_OTHER): Payer: 59 | Admitting: Obstetrics & Gynecology

## 2021-10-19 ENCOUNTER — Ambulatory Visit (HOSPITAL_BASED_OUTPATIENT_CLINIC_OR_DEPARTMENT_OTHER): Payer: 59 | Admitting: Obstetrics & Gynecology

## 2021-10-19 ENCOUNTER — Other Ambulatory Visit: Payer: Self-pay

## 2021-11-23 ENCOUNTER — Telehealth: Payer: Self-pay | Admitting: Physician Assistant

## 2021-11-23 NOTE — Telephone Encounter (Signed)
Rescheduled 11/24 and 11/30 appointment to 12/06 and 12/13. Patient has been called and voicemail was left.

## 2021-12-01 ENCOUNTER — Other Ambulatory Visit: Payer: 59

## 2021-12-07 ENCOUNTER — Ambulatory Visit: Payer: 59 | Admitting: Internal Medicine

## 2021-12-13 ENCOUNTER — Other Ambulatory Visit: Payer: Self-pay

## 2021-12-13 ENCOUNTER — Inpatient Hospital Stay: Payer: 59 | Attending: Physician Assistant

## 2021-12-13 DIAGNOSIS — Z880 Allergy status to penicillin: Secondary | ICD-10-CM | POA: Insufficient documentation

## 2021-12-13 DIAGNOSIS — Z885 Allergy status to narcotic agent status: Secondary | ICD-10-CM | POA: Insufficient documentation

## 2021-12-13 DIAGNOSIS — C9000 Multiple myeloma not having achieved remission: Secondary | ICD-10-CM | POA: Insufficient documentation

## 2021-12-13 DIAGNOSIS — Z7952 Long term (current) use of systemic steroids: Secondary | ICD-10-CM | POA: Diagnosis not present

## 2021-12-13 DIAGNOSIS — Z9049 Acquired absence of other specified parts of digestive tract: Secondary | ICD-10-CM | POA: Insufficient documentation

## 2021-12-13 DIAGNOSIS — Z79899 Other long term (current) drug therapy: Secondary | ICD-10-CM | POA: Insufficient documentation

## 2021-12-13 DIAGNOSIS — Z7969 Long term (current) use of other immunomodulators and immunosuppressants: Secondary | ICD-10-CM | POA: Diagnosis not present

## 2021-12-13 LAB — CMP (CANCER CENTER ONLY)
ALT: 25 U/L (ref 0–44)
AST: 38 U/L (ref 15–41)
Albumin: 4.3 g/dL (ref 3.5–5.0)
Alkaline Phosphatase: 129 U/L — ABNORMAL HIGH (ref 38–126)
Anion gap: 8 (ref 5–15)
BUN: 12 mg/dL (ref 8–23)
CO2: 23 mmol/L (ref 22–32)
Calcium: 9.6 mg/dL (ref 8.9–10.3)
Chloride: 103 mmol/L (ref 98–111)
Creatinine: 0.71 mg/dL (ref 0.44–1.00)
GFR, Estimated: >60 mL/min (ref >60)
Glucose, Bld: 77 mg/dL (ref 70–99)
Potassium: 4.2 mmol/L (ref 3.5–5.1)
Sodium: 134 mmol/L — ABNORMAL LOW (ref 135–145)
Total Bilirubin: 0.6 mg/dL (ref 0.3–1.2)
Total Protein: 7.4 g/dL (ref 6.5–8.1)

## 2021-12-13 LAB — CBC WITH DIFFERENTIAL (CANCER CENTER ONLY)
Abs Immature Granulocytes: 0.03 10*3/uL (ref 0.00–0.07)
Basophils Absolute: 0 10*3/uL (ref 0.0–0.1)
Basophils Relative: 1 %
Eosinophils Absolute: 0.1 10*3/uL (ref 0.0–0.5)
Eosinophils Relative: 2 %
HCT: 32.8 % — ABNORMAL LOW (ref 36.0–46.0)
Hemoglobin: 10.9 g/dL — ABNORMAL LOW (ref 12.0–15.0)
Immature Granulocytes: 1 %
Lymphocytes Relative: 20 %
Lymphs Abs: 1 10*3/uL (ref 0.7–4.0)
MCH: 28.5 pg (ref 26.0–34.0)
MCHC: 33.2 g/dL (ref 30.0–36.0)
MCV: 85.6 fL (ref 80.0–100.0)
Monocytes Absolute: 0.3 10*3/uL (ref 0.1–1.0)
Monocytes Relative: 7 %
Neutro Abs: 3.5 10*3/uL (ref 1.7–7.7)
Neutrophils Relative %: 69 %
Platelet Count: 133 10*3/uL — ABNORMAL LOW (ref 150–400)
RBC: 3.83 MIL/uL — ABNORMAL LOW (ref 3.87–5.11)
RDW: 14.1 % (ref 11.5–15.5)
WBC Count: 5 10*3/uL (ref 4.0–10.5)
nRBC: 0 % (ref 0.0–0.2)

## 2021-12-14 LAB — KAPPA/LAMBDA LIGHT CHAINS
Kappa free light chain: 22.5 mg/L — ABNORMAL HIGH (ref 3.3–19.4)
Kappa, lambda light chain ratio: 0.03 — ABNORMAL LOW (ref 0.26–1.65)
Lambda free light chains: 800.6 mg/L — ABNORMAL HIGH (ref 5.7–26.3)

## 2021-12-14 LAB — BETA 2 MICROGLOBULIN, SERUM: Beta-2 Microglobulin: 1.9 mg/L (ref 0.6–2.4)

## 2021-12-15 ENCOUNTER — Other Ambulatory Visit (HOSPITAL_BASED_OUTPATIENT_CLINIC_OR_DEPARTMENT_OTHER): Payer: Self-pay | Admitting: Obstetrics & Gynecology

## 2021-12-15 DIAGNOSIS — F419 Anxiety disorder, unspecified: Secondary | ICD-10-CM

## 2021-12-16 LAB — IGG, IGA, IGM
IgA: 220 mg/dL (ref 87–352)
IgG (Immunoglobin G), Serum: 828 mg/dL (ref 586–1602)
IgM (Immunoglobulin M), Srm: 563 mg/dL — ABNORMAL HIGH (ref 26–217)

## 2021-12-18 NOTE — Progress Notes (Unsigned)
[facility] OFFICE PROGRESS NOTE  [name], MD [place] [id number]  DIAGNOSIS: Plasma cell dyscrasia initially diagnosed as MGUS in September 2010, with additional symptoms suggestive of POEMS syndrome.   PRIOR THERAPY: 1) Velcade 1.3 MG/M2 subcutaneously with Decadron 40 mg by mouth on a weekly basis. First cycle 11/24/2013. She status post 31 weekly doses of treatment. 2) Velcade 1.3 MG/M2 subcutaneously and weekly basis with Decadron 40 mg by mouth weekly. First dose 02/01/2015. Status post 28 cycles. 3) Revlimid 25 mg by mouth daily for 21 days every 4 weeks with weekly Decadron 20 mg. started in 11/27/2015. Status post 3 cycles discontinued secondary to lack of response.  CURRENT THERAPY: Systemic treatment with Velcade 1.3 MG/KG weekly, Revlimid 25 mg by mouth daily for 21 days every 4 weeks in addition to Decadron 20 mg by mouth weekly. First dose 03/06/2016. She has a break off treatment from June 2021 until July 2022 after cycle #105. Resumed July 19, 2020. She has been on a break since May 2023.   INTERVAL HISTORY: Brenda Conner 61 y.o. female returns to clinic today for follow-up visit.  The patient is feeling fairly well today without any concerning complaints.  She is getting cataract surgery in February 2024.  She is also looking forward to visiting her family in [place].  She will be returning on January 09, 2022.  She was last seen in the clinic on 06/06/2021.  At that point in time, the patient was started on a break from her treatment with Revlimid, Velcade, and Decadron.  She has been feeling fairly well since last being seen. She denies any fever, chills, night sweats, or unexplained weight loss. Denies any signs or symptoms of infection including sore throat, nasal congestion, cough, shortness of breath, or skin infections at this time.  She does mention that she did have a mild case of COVID-19 in October 2023.  She recently  got all of her COVID boosters, RSV vaccines, and flu shot.. Denies any changes in her baseline bowel habits. Denies any nausea, vomiting. Denies any abnormal bleeding or bruising. She recently had a repeat myeloma panel performed. She is here today for evaluation and to review her lab results.    MEDICAL HISTORY: Past Medical History:  Diagnosis Date   Acid reflux    Anemia    Anxiety    Asthma    Cancer ([facility])    waldenstroms/ macroglobinulemia   Depression    Depression    Diabetes mellitus without complication ([facility])    Dysuria 02/28/2016   Gallstones    Heart palpitations    Hypercholesteremia    Hypertension    Hypothyroidism    Macroglobulinemia ([facility])    ? POEMS syndrome   Monoclonal gammopathy of unknown significance (MGUS)    Multiple myeloma ([facility])    Obesity    POEMS syndrome    PONV (postoperative nausea and vomiting)    Sleep apnea    CPAP at bedtime    ALLERGIES:  is allergic to codeine, hydrocodone, lortab [hydrocodone-acetaminophen], onion, shellfish allergy, and amoxicillin.  MEDICATIONS:  Current Outpatient Medications  Medication Sig Dispense Refill   ACCU-CHEK AVIVA PLUS test strip USE TO CHECK SUGAR TWICE A DAY 90     acyclovir (ZOVIRAX) 400 MG tablet Take 1 tablet (400 mg total) by mouth 2 (two) times daily. (Patient not taking: Reported on 08/18/2021) 180 tablet 1   ALPRAZolam (XANAX) 0.5 MG tablet Take 1  tablet (0.5 mg total) by mouth at bedtime as needed for anxiety. 30 tablet 1   aspirin 81 MG chewable tablet Chew by mouth daily.     Azelastine HCl 0.15 % SOLN as needed.     B-D UF III MINI PEN NEEDLES 31G X 5 MM MISC      Blood Glucose Monitoring Suppl (ONE TOUCH ULTRA MINI) W/DEVICE KIT See admin instructions. Reported on 01/25/2015  0   bortezomib IV (VELCADE) 3.5 MG injection Inject 1.3 mg/m2 into the vein once. (Patient not taking: Reported on 08/18/2021)     cetirizine (ZYRTEC ALLERGY) 10 MG tablet Take 1 tablet by mouth daily.     dexamethasone  (DECADRON) 4 MG tablet 5 tablets p.o. weekly on the day of chemotherapy. (Patient not taking: Reported on 08/18/2021) 60 tablet 2   DiphenhydrAMINE HCl (BENADRYL ALLERGY PO) Take 1 Dose by mouth daily at 6 (six) AM.     EPIPEN 2-PAK 0.3 MG/0.3ML SOAJ injection Reported on 06/21/2015 (Patient not taking: Reported on 12/20/2020)  1   fexofenadine (ALLEGRA ALLERGY) 180 MG tablet Take 1 tablet by mouth daily.     Fexofenadine HCl (ALLEGRA ALLERGY PO) Take by mouth daily.     Fluticasone-Salmeterol (ADVAIR) 100-50 MCG/DOSE AEPB Inhale 2 puffs into the lungs every 12 (twelve) hours.     gabapentin (NEURONTIN) 100 MG capsule Take 3 capsules (300 mg total) by mouth 3 (three) times daily. (Patient not taking: Reported on 09/07/2021) 90 capsule 2   ibuprofen (ADVIL,MOTRIN) 100 MG tablet Take 100 mg by mouth every 6 (six) hours as needed. Reported on 05/31/2015     Insulin Glargine (BASAGLAR KWIKPEN) 100 UNIT/ML Inject 20 Units into the skin at bedtime.     levothyroxine (SYNTHROID) 137 MCG tablet Take 137 mcg by mouth daily.     lisinopril (PRINIVIL,ZESTRIL) 10 MG tablet Take 10 mg by mouth daily. auth number 05/29/2018 [id number]  3   loperamide (IMODIUM) 2 MG capsule Take 2 mg by mouth as needed for diarrhea or loose stools.     magic mouthwash w/lidocaine SOLN Take 5 mLs by mouth 4 (four) times daily as needed for mouth pain. Swish, Gargle, and spit 240 mL 1   metFORMIN (GLUCOPHAGE-XR) 500 MG 24 hr tablet Take 500 mg by mouth daily.     mupirocin ointment (BACTROBAN) 2 % Apply 1 Application topically 2 (two) times daily.     NOVOLOG FLEXPEN 100 UNIT/ML FlexPen Inject 15 Units into the skin in the morning, at noon, and at bedtime. Per Sliding Scale     omeprazole (PRILOSEC) 20 MG capsule Take 20 mg by mouth 2 (two) times daily.     ondansetron (ZOFRAN-ODT) 8 MG disintegrating tablet Take 1 tablet (8 mg total) by mouth every 8 (eight) hours as needed. Reported on 05/31/2015 30 tablet 2   pravastatin (PRAVACHOL) 80  MG tablet Take 80 mg by mouth daily.     PROAIR HFA 108 (90 Base) MCG/ACT inhaler Reported on 06/21/2015  1   REVLIMID 25 MG capsule TAKE 1 CAPSULE BY MOUTH ONCE DAILY 21 DAYS ON AND 7 DAYS OFF [employer] Auth # [id number]  Date Obtained 06/26/2021. Adult female NOT of childbearing potential 21 capsule 1   sertraline (ZOLOFT) 50 MG tablet TAKE 3 TABLETS BY MOUTH EVERY DAY 270 tablet 0   verapamil (CALAN-SR) 240 MG CR tablet Take 240 mg by mouth 2 (two) times daily.     No current facility-administered medications for this visit.    SURGICAL HISTORY:  Past Surgical History:  Procedure Laterality Date   ABLATION  09/09/2007   HTA and polyp resection   BONE MARROW BIOPSY  04/08/2012   BONE MARROW BIOPSY  01/2020   FOOT SURGERY Bilateral 01/09/1996   small toe   KNEE ARTHROSCOPY W/ MENISCAL REPAIR Bilateral 10/10 ; 3/11   LAPAROSCOPIC CHOLECYSTECTOMY  01/09/1995   LUMBAR [place] SURGERY  03/08/2004   herniation, L4-L5   NASAL SINUS SURGERY  01/08/2002   UTERINE FIBROID EMBOLIZATION  2009   HTA and polyp resection    REVIEW OF SYSTEMS:   Review of Systems  Constitutional: Negative for appetite change, chills, fatigue, fever and unexpected weight change.  HENT: Negative for mouth sores, nosebleeds, sore throat and trouble swallowing.   Eyes: Negative for eye problems and icterus.  Respiratory: Negative for cough, hemoptysis, shortness of breath and wheezing.   Cardiovascular: Negative for chest pain and leg swelling.  Gastrointestinal: Negative for abdominal pain, constipation, diarrhea, nausea and vomiting.  Genitourinary: Negative for bladder incontinence, difficulty urinating, dysuria, frequency and hematuria.   Musculoskeletal: Negative for back pain, gait problem, neck pain and neck stiffness.  Skin: Negative for itching and rash.  Neurological: Negative for dizziness, extremity weakness, gait problem, headaches, light-headedness and seizures.  Hematological: Negative for adenopathy.  Does not bruise/bleed easily.  Psychiatric/Behavioral: Negative for confusion, depression and sleep disturbance. The patient is not nervous/anxious.     PHYSICAL EXAMINATION:  There were no vitals taken for this visit.  ECOG PERFORMANCE STATUS: 1  Physical Exam  Constitutional: Oriented to person, place, and time and well-developed, well-nourished, and in no distress.  HENT:  Head: Normocephalic and atraumatic.  Mouth/Throat: Oropharynx is clear and moist. No oropharyngeal exudate.  Eyes: Conjunctivae are normal. Right eye exhibits no discharge. Left eye exhibits no discharge. No scleral icterus.  Neck: Normal range of motion. Neck supple.  Cardiovascular: Normal rate, regular rhythm, normal heart sounds and intact distal pulses.   Pulmonary/Chest: Effort normal and breath sounds normal. No respiratory distress. No wheezes. No rales.  Abdominal: Soft. Bowel sounds are normal. Exhibits no distension and no mass. There is no tenderness.  Musculoskeletal: Normal range of motion. Exhibits no edema.  Lymphadenopathy:    No cervical adenopathy.  Neurological: Alert and oriented to person, place, and time. Exhibits normal muscle tone. Gait normal. Coordination normal.  Skin: Skin is warm and dry. No rash noted. Not diaphoretic. No erythema. No pallor.  Psychiatric: Mood, memory and judgment normal.  Vitals reviewed.  LABORATORY DATA: Lab Results  Component Value Date   WBC 5.0 12/13/2021   HGB 10.9 (L) 12/13/2021   HCT 32.8 (L) 12/13/2021   MCV 85.6 12/13/2021   PLT 133 (L) 12/13/2021      Chemistry      Component Value Date/Time   NA 134 (L) 12/13/2021 1241   NA 138 11/14/2016 0824   K 4.2 12/13/2021 1241   K 4.3 11/14/2016 0824   CL 103 12/13/2021 1241   CL 104 04/07/2012 1415   CO2 23 12/13/2021 1241   CO2 23 11/14/2016 0824   BUN 12 12/13/2021 1241   BUN 9.1 11/14/2016 0824   CREATININE 0.71 12/13/2021 1241   CREATININE 0.7 11/14/2016 0824      Component Value  Date/Time   CALCIUM 9.6 12/13/2021 1241   CALCIUM 9.6 11/14/2016 0824   ALKPHOS 129 (H) 12/13/2021 1241   ALKPHOS 94 11/14/2016 0824   AST 38 12/13/2021 1241   AST 28 11/14/2016 0824   ALT 25 12/13/2021  1241   ALT 24 11/14/2016 0824   BILITOT 0.6 12/13/2021 1241   BILITOT 0.41 11/14/2016 0824       RADIOGRAPHIC STUDIES:  No results found.   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 Her myeloma panel showed continuous increase in her free lambda light chain.    The patient previously underwent 107 cycles of subcutaneous weekly Velcade, as well as Revlimid and Decadron on and off over the last several years.   She had been off treatment since June 2021 until July 2022 in which she had evidence of disease progression.  Therefore, she was restarted on Velcade, Revlimid, and Decadron.  She has restarted treatment in July 2022 with cycle 107 of weekly velcade. She tolerated well without any concerning adverse side effects.  She was given a 58-monthtreatment break starting in May 2023.  She recently had a repeat myeloma panel performed.  Dr. MJulien Nordmannpersonally and independently reviewed the results with the patient discussed results with the patient today.  [facility]   We will fax the referral.  Will plan on seeing the patient back in 3 months for lab work and a follow-up visit a few days later to review the results.  The patient was advised to call immediately if she has any concerning symptoms in the interval. The patient voices understanding of current disease status and treatment options and is in agreement with the current care plan. All questions were answered. The patient knows to call the clinic with any problems, questions or concerns. We can certainly see the patient much sooner if necessary    No orders of the  defined types were placed in this encounter.    Taresa Montville L Ryland Smoots, PA-C 12/18/21  ADDENDUM: Hematology/Oncology Attending: I had a face-to-face encounter with the patient today.  I reviewed her records, lab and recommended her care plan.  This is a very pleasant 61years old white female with plasma cell dyscrasia that was initially diagnosed as MGUS in September 2011 but she continues to have worsening of her free lambda light chain and symptoms suggestive of POEMS syndrome.  She was treated with subcutaneous Velcade and Decadron with good response and decrease in the free lambda light chain then we also treated her with Revlimid and Decadron at some point but this was discontinued secondary to lack of response to the Revlimid.  She is currently on observation and has been doing fine with no concerning complaints but repeat myeloma panel showed significant increase in the free lambda light chain.  Her bone marrow never showed significant plasma cells over 5% in the past and she does not meet all the features of multiple myeloma. I had a lengthy discussion with the patient today about her current condition and treatment options.  I recommended for her to continue on observation for now but I will refer her [facility] and provide [place] guidance regarding management of her condition. Plan for the patient to come back for follow-up visit in around 3 months for evaluation and repeat myeloma panel. The patient was advised to call immediately if she has any other concerning symptoms in the interval.  The total time spent in the appointment was 30 minutes. Disclaimer: This note was dictated with voice recognition software. Similar sounding words  can inadvertently be transcribed and may be missed upon review. Eilleen Kempf, MD

## 2021-12-20 ENCOUNTER — Inpatient Hospital Stay (HOSPITAL_BASED_OUTPATIENT_CLINIC_OR_DEPARTMENT_OTHER): Payer: 59 | Admitting: Physician Assistant

## 2021-12-20 VITALS — BP 160/73 | HR 67 | Temp 98.1°F | Resp 14 | Wt 264.4 lb

## 2021-12-20 DIAGNOSIS — D472 Monoclonal gammopathy: Secondary | ICD-10-CM | POA: Diagnosis not present

## 2021-12-20 DIAGNOSIS — C9000 Multiple myeloma not having achieved remission: Secondary | ICD-10-CM | POA: Diagnosis not present

## 2021-12-21 NOTE — Progress Notes (Signed)
This nurse faxed a new patient referral to Dr. Terrilyn Saver with [facility]. Fax included office notes, labs, and patient demo and the referral.  Fax: [phone].

## 2021-12-22 ENCOUNTER — Other Ambulatory Visit: Payer: Self-pay

## 2021-12-24 ENCOUNTER — Other Ambulatory Visit: Payer: Self-pay | Admitting: Internal Medicine

## 2021-12-28 IMAGING — CT CT BIOPSY AND ASPIRATION BONE MARROW
1 of 2 series · 3 of 6 positions shown, 4 images · non-contrast
Comparison: none

INDICATION: Multiple myeloma

[Series 2: i-spiral 5.0 br40 · axial · 0.98mm/px · z∈[+1183,+1197]mm · 3 of 3 slices shown, 4 images]
[im 1/3  mediastinal]
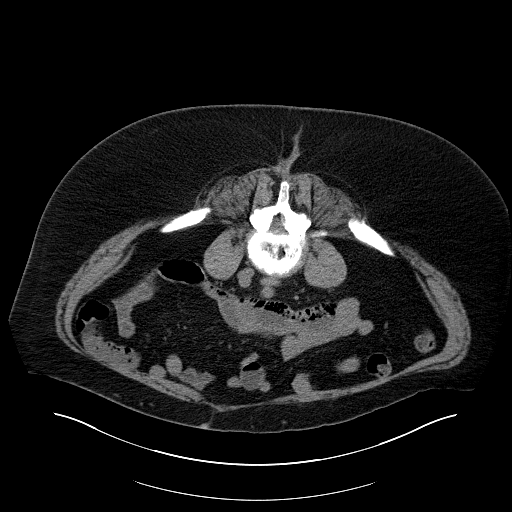
[im 1/3  lung]
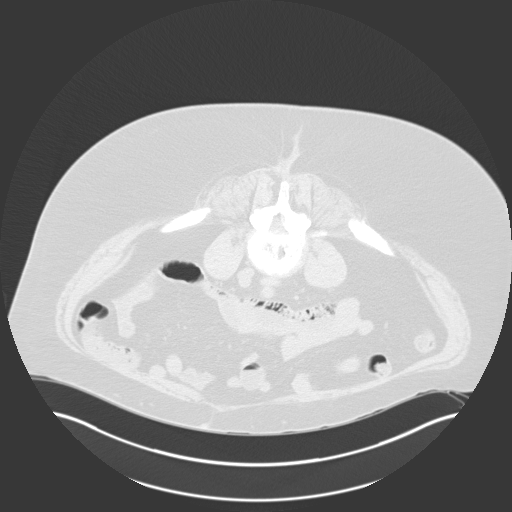
[im 2/3  lung]
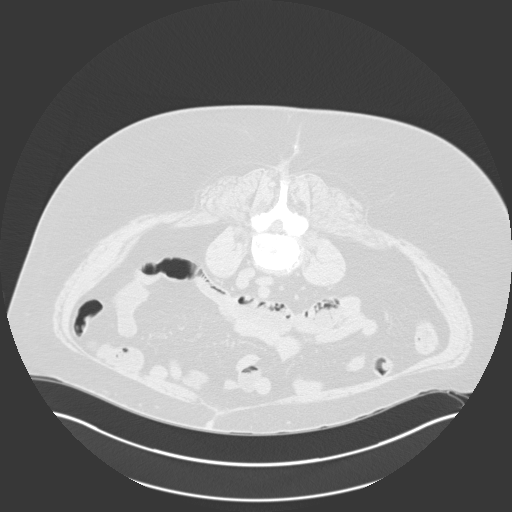
[im 3/3  lung]
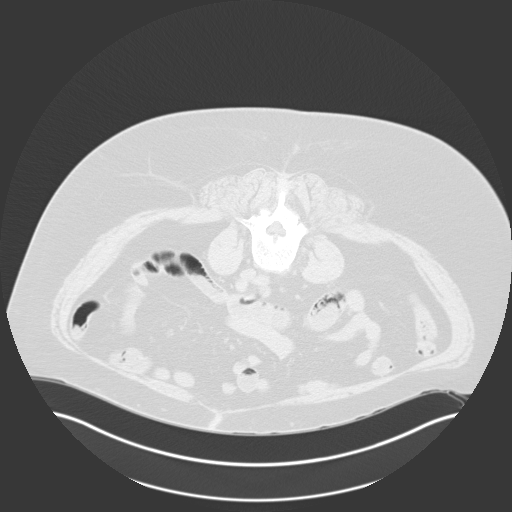

[3 of 6 positions shown; findings below may reference images not displayed]

EXAM:
CT GUIDED BONE MARROW ASPIRATES AND BIOPSY

MEDICATIONS:
None.

ANESTHESIA/SEDATION:
Fentanyl 100 mcg IV; Versed for mg IV

Moderate Sedation Time:  60

The patient was continuously monitored during the procedure by the
interventional radiology nurse under my direct supervision.

COMPLICATIONS:
None immediate.

PROCEDURE:
The procedure was explained to the patient. The risks and benefits
of the procedure were discussed and the patient's questions were
addressed. Informed consent was obtained from the patient. The
patient was placed prone on CT table. Images of the pelvis were
obtained. The right side of back was prepped and draped in sterile
fashion. The skin and right posterior ilium were anesthetized with
1% lidocaine. 11 gauge bone needle was directed into the right ilium
with CT guidance. Two aspirates and 1 core biopsy were obtained.
Bandage placed over the puncture site.
IMPRESSION: CT guided bone marrow aspiration and core biopsy.

## 2022-01-11 ENCOUNTER — Ambulatory Visit (HOSPITAL_BASED_OUTPATIENT_CLINIC_OR_DEPARTMENT_OTHER): Payer: 59 | Admitting: Obstetrics & Gynecology

## 2022-02-08 HISTORY — PX: CATARACT EXTRACTION, BILATERAL: SHX1313

## 2022-02-16 ENCOUNTER — Telehealth: Payer: Self-pay

## 2022-02-20 ENCOUNTER — Encounter: Payer: Self-pay | Admitting: Internal Medicine

## 2022-02-21 ENCOUNTER — Other Ambulatory Visit: Payer: Self-pay | Admitting: Internal Medicine

## 2022-02-21 DIAGNOSIS — C9000 Multiple myeloma not having achieved remission: Secondary | ICD-10-CM

## 2022-02-22 ENCOUNTER — Other Ambulatory Visit: Payer: Self-pay

## 2022-02-22 ENCOUNTER — Inpatient Hospital Stay: Payer: 59 | Attending: Physician Assistant

## 2022-02-22 DIAGNOSIS — Z79899 Other long term (current) drug therapy: Secondary | ICD-10-CM | POA: Insufficient documentation

## 2022-02-22 DIAGNOSIS — C9000 Multiple myeloma not having achieved remission: Secondary | ICD-10-CM | POA: Insufficient documentation

## 2022-02-22 LAB — CBC WITH DIFFERENTIAL (CANCER CENTER ONLY)
Abs Immature Granulocytes: 0.01 10*3/uL (ref 0.00–0.07)
Basophils Absolute: 0 10*3/uL (ref 0.0–0.1)
Basophils Relative: 1 %
Eosinophils Absolute: 0.1 10*3/uL (ref 0.0–0.5)
Eosinophils Relative: 2 %
HCT: 32 % — ABNORMAL LOW (ref 36.0–46.0)
Hemoglobin: 10.5 g/dL — ABNORMAL LOW (ref 12.0–15.0)
Immature Granulocytes: 0 %
Lymphocytes Relative: 21 %
Lymphs Abs: 0.9 10*3/uL (ref 0.7–4.0)
MCH: 27.9 pg (ref 26.0–34.0)
MCHC: 32.8 g/dL (ref 30.0–36.0)
MCV: 84.9 fL (ref 80.0–100.0)
Monocytes Absolute: 0.3 10*3/uL (ref 0.1–1.0)
Monocytes Relative: 6 %
Neutro Abs: 2.9 10*3/uL (ref 1.7–7.7)
Neutrophils Relative %: 70 %
Platelet Count: 118 10*3/uL — ABNORMAL LOW (ref 150–400)
RBC: 3.77 MIL/uL — ABNORMAL LOW (ref 3.87–5.11)
RDW: 14.5 % (ref 11.5–15.5)
WBC Count: 4.1 10*3/uL (ref 4.0–10.5)
nRBC: 0 % (ref 0.0–0.2)

## 2022-02-22 LAB — CMP (CANCER CENTER ONLY)
ALT: 19 U/L (ref 0–44)
AST: 29 U/L (ref 15–41)
Albumin: 4.2 g/dL (ref 3.5–5.0)
Alkaline Phosphatase: 120 U/L (ref 38–126)
Anion gap: 9 (ref 5–15)
BUN: 8 mg/dL (ref 8–23)
CO2: 23 mmol/L (ref 22–32)
Calcium: 9.1 mg/dL (ref 8.9–10.3)
Chloride: 105 mmol/L (ref 98–111)
Creatinine: 0.7 mg/dL (ref 0.44–1.00)
GFR, Estimated: >60 mL/min (ref >60)
Glucose, Bld: 133 mg/dL — ABNORMAL HIGH (ref 70–99)
Potassium: 4 mmol/L (ref 3.5–5.1)
Sodium: 137 mmol/L (ref 135–145)
Total Bilirubin: 0.6 mg/dL (ref 0.3–1.2)
Total Protein: 7 g/dL (ref 6.5–8.1)

## 2022-02-22 LAB — C-REACTIVE PROTEIN: CRP: 1 mg/dL — ABNORMAL HIGH (ref <1.0)

## 2022-02-23 LAB — BETA 2 MICROGLOBULIN, SERUM: Beta-2 Microglobulin: 1.6 mg/L (ref 0.6–2.4)

## 2022-02-23 LAB — KAPPA/LAMBDA LIGHT CHAINS
Kappa free light chain: 16.2 mg/L (ref 3.3–19.4)
Kappa, lambda light chain ratio: 0.02 — ABNORMAL LOW (ref 0.26–1.65)
Lambda free light chains: 848.4 mg/L — ABNORMAL HIGH (ref 5.7–26.3)

## 2022-02-24 LAB — IGG, IGA, IGM
IgA: 129 mg/dL (ref 87–352)
IgG (Immunoglobin G), Serum: 671 mg/dL (ref 586–1602)
IgM (Immunoglobulin M), Srm: 582 mg/dL — ABNORMAL HIGH (ref 26–217)

## 2022-02-24 LAB — C3 AND C4
C3 Complement: 156 mg/dL (ref 82–167)
Complement C4, Body Fluid: 22 mg/dL (ref 12–38)

## 2022-02-27 LAB — MAG IGM ANTIBODIES: MAG IgM Antibodies: <900 BTU (ref 0–999)

## 2022-02-27 LAB — MAG INTERPRETATION REFLEXED

## 2022-02-27 NOTE — Telephone Encounter (Signed)
Chart review only.  No changes made.

## 2022-03-08 ENCOUNTER — Encounter (HOSPITAL_COMMUNITY)
Admission: RE | Admit: 2022-03-08 | Discharge: 2022-03-08 | Disposition: A | Discharge status: Home / Self Care | Payer: 59 | Source: Ambulatory Visit | Attending: Internal Medicine | Admitting: Internal Medicine

## 2022-03-08 DIAGNOSIS — C9000 Multiple myeloma not having achieved remission: Secondary | ICD-10-CM

## 2022-03-08 LAB — GLUCOSE, CAPILLARY: Glucose-Capillary: 155 mg/dL — ABNORMAL HIGH (ref 70–99)

## 2022-03-08 MED ORDER — FLUDEOXYGLUCOSE F - 18 (FDG) INJECTION
13.2000 | Freq: Once | INTRAVENOUS | Status: AC
  Administered 2022-03-08: 13.1 via INTRAVENOUS

## 2022-03-12 ENCOUNTER — Other Ambulatory Visit (HOSPITAL_BASED_OUTPATIENT_CLINIC_OR_DEPARTMENT_OTHER): Payer: Self-pay | Admitting: Obstetrics & Gynecology

## 2022-03-12 DIAGNOSIS — F419 Anxiety disorder, unspecified: Secondary | ICD-10-CM

## 2022-03-15 ENCOUNTER — Inpatient Hospital Stay: Payer: 59 | Attending: Physician Assistant | Admitting: Internal Medicine

## 2022-03-15 VITALS — BP 151/72 | HR 65 | Temp 98.0°F | Resp 16 | Wt 265.5 lb

## 2022-03-15 DIAGNOSIS — Z9049 Acquired absence of other specified parts of digestive tract: Secondary | ICD-10-CM | POA: Diagnosis not present

## 2022-03-15 DIAGNOSIS — R5383 Other fatigue: Secondary | ICD-10-CM | POA: Insufficient documentation

## 2022-03-15 DIAGNOSIS — Z885 Allergy status to narcotic agent status: Secondary | ICD-10-CM | POA: Diagnosis not present

## 2022-03-15 DIAGNOSIS — Z79899 Other long term (current) drug therapy: Secondary | ICD-10-CM | POA: Insufficient documentation

## 2022-03-15 DIAGNOSIS — D509 Iron deficiency anemia, unspecified: Secondary | ICD-10-CM | POA: Insufficient documentation

## 2022-03-15 DIAGNOSIS — C9000 Multiple myeloma not having achieved remission: Secondary | ICD-10-CM | POA: Diagnosis not present

## 2022-03-15 DIAGNOSIS — Z880 Allergy status to penicillin: Secondary | ICD-10-CM | POA: Diagnosis not present

## 2022-03-15 NOTE — Progress Notes (Signed)
[place] [phone]   [phone]  OFFICE PROGRESS NOTE  [name], MD [place] [address]  DIAGNOSIS: Plasma cell dyscrasia initially diagnosed as MGUS in September 2010, with additional symptoms suggestive of POEMS syndrome.   PRIOR THERAPY:  1) Velcade 1.3 MG/M2 subcutaneously with Decadron 40 mg by mouth on a weekly basis. First cycle 11/24/2013. She status post 31 weekly doses of treatment. 2) Velcade 1.3 MG/M2 subcutaneously and weekly basis with Decadron 40 mg by mouth weekly. First dose 02/01/2015. Status post 28 cycles. 3) Revlimid 25 mg by mouth daily for 21 days every 4 weeks with weekly Decadron 20 mg. started in 11/27/2015. Status post 3 cycles discontinued secondary to lack of response. 4) Systemic treatment with Velcade 1.3 MG/KG weekly, Revlimid 25 mg by mouth daily for 21 days every 4 weeks in addition to Decadron 20 mg by mouth weekly. First dose 03/06/2016. Status post 42 cycles.  She has a break off treatment from June 2021 until July 2022.  CURRENT THERAPY: Observation  INTERVAL HISTORY: Brenda Conner 63 y.o. female returns to the clinic today for follow-up visit.  The patient is feeling fine today with no concerning complaints.  She denied having any current chest pain, shortness of breath, cough or hemoptysis.  She has no nausea, vomiting, diarrhea or constipation.  She has no headache or visual changes.  She denied having any recent weight loss or night sweats.  She is currently on observation for her multiple myeloma.  She was seen by Dr. Melba Coon at [facility] for second opinion and he recommended several blood work as well as PET scan which were performed recently.  She is here for evaluation and discussion of her lab and imaging studies.  MEDICAL HISTORY: Past Medical History:  Diagnosis Date   Acid reflux    Anemia    Anxiety    Asthma    Cancer ([facility])     waldenstroms/ macroglobinulemia   Depression    Depression    Diabetes mellitus without complication ([facility])    Dysuria 02/28/2016   Gallstones    Heart palpitations    Hypercholesteremia    Hypertension    Hypothyroidism    Macroglobulinemia ([facility])    ? POEMS syndrome   Monoclonal gammopathy of unknown significance (MGUS)    Multiple myeloma ([facility])    Obesity    POEMS syndrome    PONV (postoperative nausea and vomiting)    Sleep apnea    CPAP at bedtime    ALLERGIES:  is allergic to codeine, hydrocodone, lortab [hydrocodone-acetaminophen], onion, shellfish allergy, and amoxicillin.  MEDICATIONS:  Current Outpatient Medications  Medication Sig Dispense Refill   ACCU-CHEK AVIVA PLUS test strip USE TO CHECK SUGAR TWICE A DAY 90     acyclovir (ZOVIRAX) 400 MG tablet Take 1 tablet (400 mg total) by mouth 2 (two) times daily. (Patient not taking: Reported on 08/18/2021) 180 tablet 1   ALPRAZolam (XANAX) 0.5 MG tablet Take 1 tablet (0.5 mg total) by mouth at bedtime as needed for anxiety. 30 tablet 1   aspirin 81 MG chewable tablet Chew by mouth daily.     Azelastine HCl 0.15 % SOLN as needed.     B-D UF III MINI PEN NEEDLES 31G X 5 MM MISC      Blood Glucose Monitoring Suppl (ONE TOUCH ULTRA MINI) W/DEVICE KIT See admin instructions. Reported on 01/25/2015  0  bortezomib IV (VELCADE) 3.5 MG injection Inject 1.3 mg/m2 into the vein once. (Patient not taking: Reported on 08/18/2021)     cetirizine (ZYRTEC ALLERGY) 10 MG tablet Take 1 tablet by mouth daily.     dexamethasone (DECADRON) 4 MG tablet 5 tablets p.o. weekly on the day of chemotherapy. (Patient not taking: Reported on 08/18/2021) 60 tablet 2   DiphenhydrAMINE HCl (BENADRYL ALLERGY PO) Take 1 Dose by mouth daily at 6 (six) AM.     EPIPEN 2-PAK 0.3 MG/0.3ML SOAJ injection Reported on 06/21/2015 (Patient not taking: Reported on 12/20/2020)  1   fexofenadine (ALLEGRA ALLERGY) 180 MG tablet Take 1 tablet by mouth daily.     Fexofenadine  HCl (ALLEGRA ALLERGY PO) Take by mouth daily.     Fluticasone-Salmeterol (ADVAIR) 100-50 MCG/DOSE AEPB Inhale 2 puffs into the lungs every 12 (twelve) hours.     gabapentin (NEURONTIN) 100 MG capsule Take 3 capsules (300 mg total) by mouth 3 (three) times daily. (Patient not taking: Reported on 09/07/2021) 90 capsule 2   ibuprofen (ADVIL,MOTRIN) 100 MG tablet Take 100 mg by mouth every 6 (six) hours as needed. Reported on 05/31/2015     Insulin Glargine (BASAGLAR KWIKPEN) 100 UNIT/ML Inject 20 Units into the skin at bedtime.     levothyroxine (SYNTHROID) 137 MCG tablet Take 137 mcg by mouth daily.     lisinopril (PRINIVIL,ZESTRIL) 10 MG tablet Take 10 mg by mouth daily. auth number 05/29/2018 [id number]  3   loperamide (IMODIUM) 2 MG capsule Take 2 mg by mouth as needed for diarrhea or loose stools.     magic mouthwash w/lidocaine SOLN Take 5 mLs by mouth 4 (four) times daily as needed for mouth pain. Swish, Gargle, and spit 240 mL 1   metFORMIN (GLUCOPHAGE-XR) 500 MG 24 hr tablet Take 500 mg by mouth daily.     mupirocin ointment (BACTROBAN) 2 % Apply 1 Application topically 2 (two) times daily.     NOVOLOG FLEXPEN 100 UNIT/ML FlexPen Inject 15 Units into the skin in the morning, at noon, and at bedtime. Per Sliding Scale     omeprazole (PRILOSEC) 20 MG capsule Take 20 mg by mouth 2 (two) times daily.     ondansetron (ZOFRAN-ODT) 8 MG disintegrating tablet Take 1 tablet (8 mg total) by mouth every 8 (eight) hours as needed. Reported on 05/31/2015 30 tablet 2   pravastatin (PRAVACHOL) 80 MG tablet Take 80 mg by mouth daily.     PROAIR HFA 108 (90 Base) MCG/ACT inhaler Reported on 06/21/2015  1   REVLIMID 25 MG capsule TAKE 1 CAPSULE BY MOUTH ONCE DAILY 21 DAYS ON AND 7 DAYS OFF [employer] Auth # [id number]  Date Obtained 06/26/2021. Adult female NOT of childbearing potential 21 capsule 1   sertraline (ZOLOFT) 50 MG tablet TAKE 3 TABLETS BY MOUTH EVERY DAY 270 tablet 0   verapamil (CALAN-SR) 240 MG CR tablet  Take 240 mg by mouth 2 (two) times daily.     No current facility-administered medications for this visit.    SURGICAL HISTORY:  Past Surgical History:  Procedure Laterality Date   ABLATION  09/09/2007   HTA and polyp resection   BONE MARROW BIOPSY  04/08/2012   BONE MARROW BIOPSY  01/2020   FOOT SURGERY Bilateral 01/09/1996   small toe   KNEE ARTHROSCOPY W/ MENISCAL REPAIR Bilateral 10/10 ; 3/11   LAPAROSCOPIC CHOLECYSTECTOMY  01/09/1995   LUMBAR [place] SURGERY  03/08/2004   herniation, L4-L5   NASAL SINUS SURGERY  01/08/2002   UTERINE FIBROID EMBOLIZATION  2009   HTA and polyp resection    REVIEW OF SYSTEMS:  Constitutional: positive for fatigue Eyes: negative Ears, nose, mouth, throat, and face: negative Respiratory: negative Cardiovascular: negative Gastrointestinal: negative Genitourinary:negative Integument/breast: negative Hematologic/lymphatic: negative Musculoskeletal:negative Neurological: negative Behavioral/Psych: negative Endocrine: negative Allergic/Immunologic: negative   PHYSICAL EXAMINATION: General appearance: alert, cooperative, fatigued, and no distress Head: Normocephalic, without obvious abnormality, atraumatic Neck: no adenopathy Lymph nodes: Cervical, supraclavicular, and axillary nodes normal. Resp: clear to auscultation bilaterally Back: symmetric, no curvature. ROM normal. No CVA tenderness. Cardio: regular rate and rhythm, S1, S2 normal, no murmur, click, rub or gallop GI: soft, non-tender; bowel sounds normal; no masses,  no organomegaly Extremities: extremities normal, atraumatic, no cyanosis or edema Neurologic: Alert and oriented X 3, normal strength and tone. Normal symmetric reflexes. Normal coordination and gait  ECOG PERFORMANCE STATUS: 1 - Symptomatic but completely ambulatory  Blood pressure (!) 151/72, pulse 65, temperature 98 F (36.7 C), temperature source Oral, resp. rate 16, weight 265 lb 8 oz (120.4 kg), SpO2 100  %.  LABORATORY DATA: Lab Results  Component Value Date   WBC 4.1 02/22/2022   HGB 10.5 (L) 02/22/2022   HCT 32.0 (L) 02/22/2022   MCV 84.9 02/22/2022   PLT 118 (L) 02/22/2022      Chemistry      Component Value Date/Time   NA 137 02/22/2022 1249   NA 138 11/14/2016 0824   K 4.0 02/22/2022 1249   K 4.3 11/14/2016 0824   CL 105 02/22/2022 1249   CL 104 04/07/2012 1415   CO2 23 02/22/2022 1249   CO2 23 11/14/2016 0824   BUN 8 02/22/2022 1249   BUN 9.1 11/14/2016 0824   CREATININE 0.70 02/22/2022 1249   CREATININE 0.7 11/14/2016 0824      Component Value Date/Time   CALCIUM 9.1 02/22/2022 1249   CALCIUM 9.6 11/14/2016 0824   ALKPHOS 120 02/22/2022 1249   ALKPHOS 94 11/14/2016 0824   AST 29 02/22/2022 1249   AST 28 11/14/2016 0824   ALT 19 02/22/2022 1249   ALT 24 11/14/2016 0824   BILITOT 0.6 02/22/2022 1249   BILITOT 0.41 11/14/2016 0824      ASSESSMENT AND PLAN:  This is a very pleasant 62 years old white female with smoldering multiple myeloma with questionable POEMS syndrome symptom.  The patient has been on treatment with subcutaneous weekly Velcade as well as Revlimid and Decadron status post 34 cycles.  She has been tolerating this treatment well. The patient has been on observation for the last several months and she has been doing fine with no concerning issues except for the recent upper respiratory infection and viral gastroenteritis. Her myeloma panel showed continuous increase in her free lambda light chain. The patient had a bone marrow biopsy and aspirate recently that showed 3-5% plasma cells still suspicious for plasma cell dyscrasia.  The skeletal bone survey was negative for any lytic lesions. The patient has been off treatment for more than a year between June 2021 until July 2022. She had repeat myeloma panel that showed significant worsening and increase of her lambda light chain.  She continues to have anemia but no renal insufficiency or  hypercalcemia. She resumed her treatment with Velcade, Revlimid and Decadron on July 19, 2020.   The patient is currently on observation and she is feeling fine with no concerning complaints. She had several studies performed recently including PET scan that showed no concerning multiple myeloma  lesion.  Myeloma panel showed the persistent elevation of the the free lambda light chain.  The patient is referred to neurology for consideration of nerve conduction study. I recommended for her to follow with Dr. Melba Coon for recommendation regarding the next step in her treatment and will be happy to do it locally. For the iron deficiency anemia, I will give her a refill of Integra +1 capsule p.o. daily. I will see her back for follow-up visit after meeting with Dr. Melba Coon. She was advised to call immediately if she has any concerning symptoms in the interval. All questions were answered. The patient knows to call the clinic with any problems, questions or concerns. We can certainly see the patient much sooner if necessary.  Disclaimer: This note was dictated with voice recognition software. Similar sounding words can inadvertently be transcribed and may not be corrected upon review.

## 2022-03-20 ENCOUNTER — Telehealth: Payer: Self-pay | Admitting: Family Medicine

## 2022-03-20 NOTE — Telephone Encounter (Signed)
Per 3/12 IB reached out to patient. Patient called earlier to cancel office visit and wanted to make sure the labs was cancelled as well.

## 2022-03-22 ENCOUNTER — Inpatient Hospital Stay: Payer: 59

## 2022-03-29 ENCOUNTER — Ambulatory Visit: Payer: 59 | Admitting: Internal Medicine

## 2022-04-14 ENCOUNTER — Other Ambulatory Visit (HOSPITAL_BASED_OUTPATIENT_CLINIC_OR_DEPARTMENT_OTHER): Payer: Self-pay | Admitting: Obstetrics & Gynecology

## 2022-04-14 DIAGNOSIS — F419 Anxiety disorder, unspecified: Secondary | ICD-10-CM

## 2022-04-17 ENCOUNTER — Other Ambulatory Visit: Payer: Self-pay | Admitting: Family Medicine

## 2022-04-17 DIAGNOSIS — I517 Cardiomegaly: Secondary | ICD-10-CM

## 2022-04-17 NOTE — Progress Notes (Signed)
Cardiomegaly noted on recent study.  Echo.

## 2022-04-19 ENCOUNTER — Encounter: Payer: Self-pay | Admitting: Neurology

## 2022-04-19 ENCOUNTER — Ambulatory Visit: Payer: 59 | Admitting: Neurology

## 2022-04-19 VITALS — BP 156/71 | HR 91 | Ht 68.0 in | Wt 271.5 lb

## 2022-04-19 DIAGNOSIS — E349 Endocrine disorder, unspecified: Secondary | ICD-10-CM | POA: Diagnosis not present

## 2022-04-19 DIAGNOSIS — G629 Polyneuropathy, unspecified: Secondary | ICD-10-CM

## 2022-04-19 DIAGNOSIS — R202 Paresthesia of skin: Secondary | ICD-10-CM | POA: Diagnosis not present

## 2022-04-19 DIAGNOSIS — D472 Monoclonal gammopathy: Secondary | ICD-10-CM

## 2022-04-19 DIAGNOSIS — R239 Unspecified skin changes: Secondary | ICD-10-CM

## 2022-04-19 NOTE — Progress Notes (Signed)
Chief Complaint  Patient presents with   New Patient (Initial Visit)    Rm 17, alone Neuropathy (paresthesia in fingers, tops of both feet, & toes)  hand dexterity is limited and causes discomfort       ASSESSMENT AND PLAN  Brenda PlumeJennifer A Conner is a 62 y.o. female  IgM lambda multiple myeloma with associated neuropathy, bilateral feet and hand paresthesia  EMG nerve conduction study to better characterize the neuropathic process,   DIAGNOSTIC DATA (LABS, IMAGING, TESTING) - I reviewed patient records, labs, notes, testing and imaging myself where available.   MEDICAL HISTORY:  Brenda Conner is a 62 year old female, seen in request by her primary care doctor   Shelly FlattenMerrell, David for evaluation of paresthesia, initial evaluation was on April 19, 2022  I reviewed and summarized the referring note. PMHX HTN DM Lumbar decompression surgery in 2006 for right lumbar radiculopathy  She is under the care of of oncologist Dr. Shirline FreesMohammed, I reviewed evaluation from March 15, 2022, plasma cell dyscrasia, initially was diagnosed as monoclonal antibody of unknown clinical significance in September 2010, with additional symptoms suggestive of POEMS syndrome,  Over the years, she was treated with different medications, 1) Velcade 1.3 MG/M2 subcutaneously with Decadron 40 mg by mouth on a weekly basis. First cycle 11/24/2013. She status post 31 weekly doses of treatment. 2) Velcade 1.3 MG/M2 subcutaneously and weekly basis with Decadron 40 mg by mouth weekly. First dose 02/01/2015. Status post 28 cycles. 3) Revlimid 25 mg by mouth daily for 21 days every 4 weeks with weekly Decadron 20 mg. started in 11/27/2015. Status post 3 cycles discontinued secondary to lack of response. 4) Systemic treatment with Velcade 1.3 MG/KG weekly, Revlimid 25 mg by mouth daily for 21 days every 4 weeks in addition to Decadron 20 mg by mouth weekly. First dose 03/06/2016. Status post 42 cycles.  She has a break off  treatment from June 2021 until July 2022.  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, can serial patient does not technically meet criteria for POEMS syndrome, might have another monoclonal gammopathy of neurological significance, clinical story is suspicious for casual connection between her plasma cell disorder and neuropathy, l differentiation diagnoses also include AL amyloidosis, MAC neuropathy, and cryoglobulinemia ,    Clarification of diagnosis of multiple myeloma with continued serologic progression or determination of causal association between her neuropathy and plasma cell disorder will potentially impact future treatment option,  Suggested further evaluation including EMG nerve conduction study to clarify her neuropathy process demyelinating versus axonal, rule out cervical and lumbar radiculopathy,  PET whole body in Feb 2024.  1. No focal abnormal increased uptake in the skeleton cyst to suggest metabolically active multiple myeloma. 2. No tracer avid lymph nodes or mass identified to suggest soft tissue plasmacytoma. 3. Diffuse FDG avidity about the bilateral knees and dorsal midfoot is nonspecific but favored to reflect degenerative change. 4. Hepatomegaly with hepatic steatosis and morphologic changes suggestive of cirrhosis. 5. Splenomegaly would be suggestive of portal hypertension in the setting of cirrhosis. 6. Hypodense homogeneous 4.2 cm right ovarian cyst does not demonstrate abnormal FDG avidity. Suggest further evaluation with pelvic ultrasound. 7.  Aortic Atherosclerosis (ICD10-I70.0).   She was diagnosed with MGUS in 2010, initially asymptomatic, later developed generalized fatigue,  She has a history of chronic neck pain, getting worse since  2020, radiating pain to her shoulder, upper extremity,  bilateral fourth and fifth finger numbness tingling, woke up bilateral hands feeling swollen, clumsy, also has intermittent bilateral toes paresthesia, denies significant pain, denied gait abnormality, no bowel or bladder incontinence  She did have a history of chronic low back pain, lumbar decompression surgery in the past for right lumbar radiculopathy  Laboratory evaluations, MAG Ig M was negative, normal C3, C4, ferritin, B12, 274, TSH Elevated IgM 582, elevated lambda free light chain 848,  PHYSICAL EXAM:   Vitals:   04/19/22 1413  BP: (!) 156/71  Pulse: 91  Weight: 271 lb 8 oz (123.2 kg)  Height:  (1.727 m)   Body mass index is 41.28 kg/m.  PHYSICAL EXAMNIATION:  Gen: NAD, conversant, well nourised, well groomed                     Cardiovascular: Regular rate rhythm, no peripheral edema, warm, nontender. Eyes: Conjunctivae clear without exudates or hemorrhage Neck: Supple, no carotid bruits. Pulmonary: Clear to auscultation bilaterally   NEUROLOGICAL EXAM:  MENTAL STATUS: Speech/cognition: Awake, alert, oriented to history taking and casual conversation CRANIAL NERVES: CN II: Visual fields are full to confrontation. Pupils are round equal and briskly reactive to light. CN III, IV, VI: extraocular movement are normal. No ptosis. CN V: Facial sensation is intact to light touch CN VII: Face is symmetric with normal eye closure  CN VIII: Hearing is normal to causal conversation. CN IX, X: Phonation is normal. CN XI: Head turning and shoulder shrug are intact  MOTOR: Mild left abductor pollicis brevis, opponens weakness.  REFLEXES: Reflexes are 1 and symmetric at the biceps, triceps, knees, and ankles. Plantar responses are flexor.  SENSORY: Mildly decreased vibratory sensation at toes, mildly length-dependent decreased light touch pinprick to ankle level, decreased to pinprick at bilateral finger pads, mainly involving lateral 4 fingers, most noticeable at the  left hand  COORDINATION: There is no trunk or limb dysmetria noted.  GAIT/STANCE: Posture is normal. Gait is steady with normal steps, base, arm swing, and turning. Heel and toe walking are normal. Tandem gait is normal.  Romberg is absent.  REVIEW OF SYSTEMS:  Full 14 system review of systems performed and notable only for as above All other review of systems were negative.   ALLERGIES: Allergies  Allergen Reactions   Codeine Hives   Hydrocodone Nausea And Vomiting   Levothyroxine Sodium Other (See Comments)   Lortab [Hydrocodone-Acetaminophen] Nausea And Vomiting   Onion    Shellfish Allergy    Amoxicillin Rash    HOME MEDICATIONS: Current Outpatient Medications  Medication Sig Dispense Refill   ACCU-CHEK AVIVA PLUS test strip USE TO CHECK SUGAR TWICE A DAY 90     ALPRAZolam (XANAX) 0.5 MG tablet Take 1 tablet (0.5 mg total) by mouth at bedtime as needed for anxiety. 30 tablet 1   aspirin 81 MG chewable tablet Chew by mouth daily.     Azelastine HCl 0.15 % SOLN as needed.     B-D UF III MINI PEN NEEDLES 31G X 5 MM MISC      Blood Glucose Monitoring Suppl (ONE TOUCH ULTRA MINI) W/DEVICE KIT See admin instructions. Reported on 01/25/2015  0   cetirizine (ZYRTEC ALLERGY) 10 MG tablet Take 1 tablet by mouth daily.     DiphenhydrAMINE HCl (BENADRYL ALLERGY PO) Take 1 Dose by mouth daily at 6 (six) AM.     fexofenadine (ALLEGRA ALLERGY) 180 MG tablet Take 1  tablet by mouth daily.     Fluticasone-Salmeterol (ADVAIR) 100-50 MCG/DOSE AEPB Inhale 2 puffs into the lungs every 12 (twelve) hours.     glucosamine-chondroitin 500-400 MG tablet Take 1 tablet by mouth daily.     ibuprofen (ADVIL,MOTRIN) 100 MG tablet Take 100 mg by mouth every 6 (six) hours as needed. Reported on 05/31/2015     Insulin Glargine (BASAGLAR KWIKPEN) 100 UNIT/ML Inject 20 Units into the skin at bedtime.     levothyroxine (SYNTHROID) 137 MCG tablet Take 137 mcg by mouth daily.     lisinopril (PRINIVIL,ZESTRIL)  10 MG tablet Take 10 mg by mouth daily. auth number 05/29/2018 [id number]  3   loperamide (IMODIUM) 2 MG capsule Take 2 mg by mouth as needed for diarrhea or loose stools.     magic mouthwash w/lidocaine SOLN Take 5 mLs by mouth 4 (four) times daily as needed for mouth pain. Swish, Gargle, and spit 240 mL 1   Melatonin 5 MG CAPS Take 1 capsule by mouth at bedtime.     metFORMIN (GLUCOPHAGE-XR) 500 MG 24 hr tablet Take 500 mg by mouth daily.     NOVOLOG FLEXPEN 100 UNIT/ML FlexPen Inject 15 Units into the skin in the morning, at noon, and at bedtime. Per Sliding Scale     omeprazole (PRILOSEC) 20 MG capsule Take 20 mg by mouth 2 (two) times daily.     pravastatin (PRAVACHOL) 80 MG tablet Take 80 mg by mouth daily.     PROAIR HFA 108 (90 Base) MCG/ACT inhaler Reported on 06/21/2015  1   REVLIMID 25 MG capsule TAKE 1 CAPSULE BY MOUTH ONCE DAILY 21 DAYS ON AND 7 DAYS OFF [employer] Auth # [id number]  Date Obtained 06/26/2021. Adult female NOT of childbearing potential 21 capsule 1   sertraline (ZOLOFT) 50 MG tablet TAKE 3 TABLETS BY MOUTH EVERY DAY 90 tablet 0   verapamil (CALAN-SR) 240 MG CR tablet Take 240 mg by mouth 2 (two) times daily.     No current facility-administered medications for this visit.    PAST MEDICAL HISTORY: Past Medical History:  Diagnosis Date   Acid reflux    Anemia    Anxiety    Asthma    Cancer    waldenstroms/ macroglobinulemia   Depression    Depression    Diabetes mellitus without complication    Dysuria 02/28/2016   Gallstones    Heart palpitations    Hypercholesteremia    Hypertension    Hypothyroidism    Macroglobulinemia    ? POEMS syndrome   Monoclonal gammopathy of unknown significance (MGUS)    Multiple myeloma    Obesity    POEMS syndrome    PONV (postoperative nausea and vomiting)    Sleep apnea    CPAP at bedtime    PAST SURGICAL HISTORY: Past Surgical History:  Procedure Laterality Date   ABLATION  09/09/2007   HTA and polyp resection   BONE  MARROW BIOPSY  04/08/2012   BONE MARROW BIOPSY  01/2020   FOOT SURGERY Bilateral 01/09/1996   small toe   KNEE ARTHROSCOPY W/ MENISCAL REPAIR Bilateral 10/10 ; 3/11   LAPAROSCOPIC CHOLECYSTECTOMY  01/09/1995   LUMBAR DISC SURGERY  03/08/2004   herniation, L4-L5   NASAL SINUS SURGERY  01/08/2002   UTERINE FIBROID EMBOLIZATION  2009   HTA and polyp resection    FAMILY HISTORY: Family History  Problem Relation Age of Onset   Colon polyps Mother    Stroke Mother  Heart disease Mother        heart cath   Asthma Mother    Endometriosis Mother    Hypertension Mother    Hyperlipidemia Mother    Hyperlipidemia Father    Heart disease Father    Colon polyps Sister    Leukemia Maternal Grandmother    Heart disease Paternal Grandmother    Esophageal cancer Neg Hx    Rectal cancer Neg Hx    Stomach cancer Neg Hx     SOCIAL HISTORY: Social History   Socioeconomic History   Marital status: Single    Spouse name: Not on file   Number of children: 0   Years of education: Not on file   Highest education level: Not on file  Occupational History   Occupation: Chiropodist  Tobacco Use   Smoking status: Never   Smokeless tobacco: Never  Vaping Use   Vaping Use: Never used  Substance and Sexual Activity   Alcohol use: No   Drug use: No   Sexual activity: Not Currently    Birth control/protection: Post-menopausal, Surgical  Other Topics Concern   Not on file  Social History Narrative   Not on file   Social Determinants of Health   Financial Resource Strain: Not on file  Food Insecurity: Not on file  Transportation Needs: Not on file  Physical Activity: Not on file  Stress: Not on file  Social Connections: Not on file  Intimate Partner Violence: Not on file      Levert Feinstein, M.D. Ph.D.  [facility] [address] [place], [place] [address] Ph: [phone] Fax: [phone]  CC:  [name], MD [address] [place],  [place] [address]  [name], MD

## 2022-04-30 ENCOUNTER — Ambulatory Visit: Payer: 59 | Admitting: Neurology

## 2022-04-30 DIAGNOSIS — R202 Paresthesia of skin: Secondary | ICD-10-CM

## 2022-04-30 NOTE — Progress Notes (Signed)
Chief Complaint  Patient presents with   Procedure    Rm EMG/NCV 4.      ASSESSMENT AND PLAN  Brenda Conner is a 62 y.o. female  IgM lambda multiple myeloma with associated neuropathy, bilateral feet and hand paresthesia  EMG nerve conduction study confirmed moderate bilateral carpal tunnel syndromes, only slight axonal peripheral neuropathy.  Current findings lack the typical progressive course and characteristic of the polyneuropathy that is associated with POEM syndrome.   DIAGNOSTIC DATA (LABS, IMAGING, TESTING) - I reviewed patient records, labs, notes, testing and imaging myself where available.   MEDICAL HISTORY:  Brenda Conner is a 62 year old female, seen in request by her primary care doctor   Shelly Flatten for evaluation of paresthesia, initial evaluation was on April 19, 2022  I reviewed and summarized the referring note. PMHX HTN DM Lumbar decompression surgery in 2006 for right lumbar radiculopathy  She is under the care of of oncologist Dr. Shirline Frees, I reviewed evaluation from March 15, 2022, plasma cell dyscrasia, initially was diagnosed as monoclonal antibody of unknown clinical significance in September 2010, with additional symptoms suggestive of POEMS syndrome,  Over the years, she was treated with different medications, 1) Velcade 1.3 MG/M2 subcutaneously with Decadron 40 mg by mouth on a weekly basis. First cycle 11/24/2013. She status post 31 weekly doses of treatment. 2) Velcade 1.3 MG/M2 subcutaneously and weekly basis with Decadron 40 mg by mouth weekly. First dose 02/01/2015. Status post 28 cycles. 3) Revlimid 25 mg by mouth daily for 21 days every 4 weeks with weekly Decadron 20 mg. started in 11/27/2015. Status post 3 cycles discontinued secondary to lack of response. 4) Systemic treatment with Velcade 1.3 MG/KG weekly, Revlimid 25 mg by mouth daily for 21 days every 4 weeks in addition to Decadron 20 mg by mouth weekly. First dose 03/06/2016.  Status post 42 cycles.  She has a break off treatment from June 2021 until July 2022.  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, can serial patient does not technically meet criteria for POEMS syndrome, might have another monoclonal gammopathy of neurological significance, clinical story is suspicious for casual connection between her plasma cell disorder and neuropathy, l differentiation diagnoses also include AL amyloidosis, MAC neuropathy, and cryoglobulinemia ,    Clarification of diagnosis of multiple myeloma with continued serologic progression or determination of causal association between her neuropathy and plasma cell disorder will potentially impact future treatment option,  Suggested further evaluation including EMG nerve conduction study to clarify her neuropathy process demyelinating versus axonal, rule out cervical and lumbar radiculopathy,  PET whole body in Feb 2024.  1. No focal abnormal increased uptake in the skeleton cyst to suggest metabolically active multiple myeloma. 2. No tracer avid lymph nodes or mass identified to suggest soft tissue plasmacytoma. 3. Diffuse FDG avidity about the bilateral knees and dorsal midfoot is nonspecific but favored to reflect degenerative change. 4. Hepatomegaly with hepatic steatosis and morphologic changes suggestive of cirrhosis. 5. Splenomegaly would be suggestive of portal hypertension in the setting of cirrhosis. 6. Hypodense homogeneous 4.2 cm right ovarian cyst does not demonstrate abnormal FDG avidity. Suggest further evaluation with pelvic ultrasound. 7.  Aortic Atherosclerosis (ICD10-I70.0).   She was diagnosed with MGUS in 2010, initially asymptomatic, later developed generalized fatigue,  She has a history of chronic neck pain, getting  worse since 2020,  radiating pain to her shoulder, upper extremity, bilateral fourth and fifth finger numbness tingling, woke up bilateral hands feeling swollen, clumsy, also has intermittent bilateral toes paresthesia, denies significant pain, denied gait abnormality, no bowel or bladder incontinence  She did have a history of chronic low back pain, lumbar decompression surgery in the past for right lumbar radiculopathy  Laboratory evaluations, MAG Ig M was negative, normal C3, C4, ferritin, B12, 274, TSH Elevated IgM 582, elevated lambda free light chain 848,  UPDATE April 30 2022: She return for electrodiagnostic study today, which showed evidence of moderate bilateral carpal tunnel syndromes, no evidence of axonal loss.  This happened in the background of slight axonal peripheral neuropathy She does complains of fingertips numbness tingling, work on the computer, can cause finger swollen sensation, sometimes woke up with the numbness tingling, only have intermittent toes numbness, no pain,   Laboratory reevaluations, MAG Ig M was negative. Ig M level was high 582.  [place]amba free light chain was elevated 848.   PHYSICAL EXAM:   Vitals:   04/30/22 1258  BP: (!) 142/75  Pulse: 72  Weight: 271 lb (122.9 kg)  Height: 5\' 8"  (1.727 m)   Body mass index is 41.21 kg/m.  PHYSICAL EXAMNIATION:  Gen: NAD, conversant, well nourised, well groomed                     Cardiovascular: Regular rate rhythm, no peripheral edema, warm, nontender. Eyes: Conjunctivae clear without exudates or hemorrhage Neck: Supple, no carotid bruits. Pulmonary: Clear to auscultation bilaterally   NEUROLOGICAL EXAM:  MENTAL STATUS: Speech/cognition: Awake, alert, oriented to history taking and casual conversation CRANIAL NERVES: CN II: Visual fields are full to confrontation. Pupils are round equal and briskly reactive to light. CN III, IV, VI: extraocular movement are normal. No ptosis. CN V: Facial sensation is intact to light  touch CN VII: Face is symmetric with normal eye closure  CN VIII: Hearing is normal to causal conversation. CN IX, X: Phonation is normal. CN XI: Head turning and shoulder shrug are intact  MOTOR: Mild left abductor pollicis brevis, opponens weakness.  REFLEXES: Reflexes are 1 and symmetric at the biceps, triceps, knees, and ankles. Plantar responses are flexor.  SENSORY: Mildly decreased vibratory sensation at toes, mildly length-dependent decreased light touch pinprick to ankle level, decreased to pinprick at bilateral finger pads, mainly involving lateral 4 fingers, most noticeable at the left hand  COORDINATION: There is no trunk or limb dysmetria noted.  GAIT/STANCE: Posture is normal. Gait is steady with normal steps, base, arm swing, and turning. Heel and toe walking are normal. Tandem gait is normal.  Romberg is absent.  REVIEW OF SYSTEMS:  Full 14 system review of systems performed and notable only for as above All other review of systems were negative.   ALLERGIES: Allergies  Allergen Reactions   Codeine Hives   Hydrocodone Nausea And Vomiting   Levothyroxine Sodium Other (See Comments)   Lortab [Hydrocodone-Acetaminophen] Nausea And Vomiting   Onion    Shellfish Allergy    Amoxicillin Rash    HOME MEDICATIONS: Current Outpatient Medications  Medication Sig Dispense Refill   ACCU-CHEK AVIVA PLUS test strip USE TO CHECK SUGAR TWICE A DAY 90     ALPRAZolam (XANAX) 0.5 MG tablet Take 1 tablet (0.5 mg total) by mouth at bedtime as needed for anxiety. 30 tablet 1   aspirin 81 MG chewable tablet Chew by mouth daily.  Azelastine HCl 0.15 % SOLN as needed.     B-D UF III MINI PEN NEEDLES 31G X 5 MM MISC      Blood Glucose Monitoring Suppl (ONE TOUCH ULTRA MINI) W/DEVICE KIT See admin instructions. Reported on 01/25/2015  0   cetirizine (ZYRTEC ALLERGY) 10 MG tablet Take 1 tablet by mouth daily.     DiphenhydrAMINE HCl (BENADRYL ALLERGY PO) Take 1 Dose by mouth daily  at 6 (six) AM.     fexofenadine (ALLEGRA ALLERGY) 180 MG tablet Take 1 tablet by mouth daily.     Fluticasone-Salmeterol (ADVAIR) 100-50 MCG/DOSE AEPB Inhale 2 puffs into the lungs every 12 (twelve) hours.     glucosamine-chondroitin 500-400 MG tablet Take 1 tablet by mouth daily.     ibuprofen (ADVIL,MOTRIN) 100 MG tablet Take 100 mg by mouth every 6 (six) hours as needed. Reported on 05/31/2015     Insulin Glargine (BASAGLAR KWIKPEN) 100 UNIT/ML Inject 20 Units into the skin at bedtime.     levothyroxine (SYNTHROID) 137 MCG tablet Take 137 mcg by mouth daily.     lisinopril (PRINIVIL,ZESTRIL) 10 MG tablet Take 10 mg by mouth daily. auth number 05/29/2018 [id number]  3   loperamide (IMODIUM) 2 MG capsule Take 2 mg by mouth as needed for diarrhea or loose stools.     magic mouthwash w/lidocaine SOLN Take 5 mLs by mouth 4 (four) times daily as needed for mouth pain. Swish, Gargle, and spit 240 mL 1   Melatonin 5 MG CAPS Take 1 capsule by mouth at bedtime.     metFORMIN (GLUCOPHAGE-XR) 500 MG 24 hr tablet Take 500 mg by mouth daily.     NOVOLOG FLEXPEN 100 UNIT/ML FlexPen Inject 15 Units into the skin in the morning, at noon, and at bedtime. Per Sliding Scale     omeprazole (PRILOSEC) 20 MG capsule Take 20 mg by mouth 2 (two) times daily.     pravastatin (PRAVACHOL) 80 MG tablet Take 80 mg by mouth daily.     PROAIR HFA 108 (90 Base) MCG/ACT inhaler Reported on 06/21/2015  1   REVLIMID 25 MG capsule TAKE 1 CAPSULE BY MOUTH ONCE DAILY 21 DAYS ON AND 7 DAYS OFF [employer] Auth # [id number]  Date Obtained 06/26/2021. Adult female NOT of childbearing potential 21 capsule 1   sertraline (ZOLOFT) 50 MG tablet TAKE 3 TABLETS BY MOUTH EVERY DAY 90 tablet 0   verapamil (CALAN-SR) 240 MG CR tablet Take 240 mg by mouth 2 (two) times daily.     No current facility-administered medications for this visit.    PAST MEDICAL HISTORY: Past Medical History:  Diagnosis Date   Acid reflux    Anemia    Anxiety    Asthma     Cancer    waldenstroms/ macroglobinulemia   Depression    Depression    Diabetes mellitus without complication    Dysuria 02/28/2016   Gallstones    Heart palpitations    Hypercholesteremia    Hypertension    Hypothyroidism    Macroglobulinemia    ? POEMS syndrome   Monoclonal gammopathy of unknown significance (MGUS)    Multiple myeloma    Obesity    POEMS syndrome    PONV (postoperative nausea and vomiting)    Sleep apnea    CPAP at bedtime    PAST SURGICAL HISTORY: Past Surgical History:  Procedure Laterality Date   ABLATION  09/09/2007   HTA and polyp resection   BONE MARROW BIOPSY  04/08/2012  BONE MARROW BIOPSY  01/2020   FOOT SURGERY Bilateral 01/09/1996   small toe   KNEE ARTHROSCOPY W/ MENISCAL REPAIR Bilateral 10/10 ; 3/11   LAPAROSCOPIC CHOLECYSTECTOMY  01/09/1995   LUMBAR DISC SURGERY  03/08/2004   herniation, L4-L5   NASAL SINUS SURGERY  01/08/2002   UTERINE FIBROID EMBOLIZATION  2009   HTA and polyp resection    FAMILY HISTORY: Family History  Problem Relation Age of Onset   Colon polyps Mother    Stroke Mother    Heart disease Mother        heart cath   Asthma Mother    Endometriosis Mother    Hypertension Mother    Hyperlipidemia Mother    Hyperlipidemia Father    Heart disease Father    Colon polyps Sister    Leukemia Maternal Grandmother    Heart disease Paternal Grandmother    Esophageal cancer Neg Hx    Rectal cancer Neg Hx    Stomach cancer Neg Hx     SOCIAL HISTORY: Social History   Socioeconomic History   Marital status: Single    Spouse name: Not on file   Number of children: 0   Years of education: Not on file   Highest education level: Not on file  Occupational History   Occupation: Chiropodist  Tobacco Use   Smoking status: Never   Smokeless tobacco: Never  Vaping Use   Vaping Use: Never used  Substance and Sexual Activity   Alcohol use: No   Drug use: No   Sexual activity: Not Currently    Birth  control/protection: Post-menopausal, Surgical  Other Topics Concern   Not on file  Social History Narrative   Not on file   Social Determinants of Health   Financial Resource Strain: Not on file  Food Insecurity: Not on file  Transportation Needs: Not on file  Physical Activity: Not on file  Stress: Not on file  Social Connections: Not on file  Intimate Partner Violence: Not on file      Levert Feinstein, M.D. Ph.D.  [facility] [address], [address] [place], [place] [address] Ph: [phone] Fax: [phone]  CC:  [name], MD [address] [place],  [place] [address]  [name], MD

## 2022-05-03 NOTE — Procedures (Signed)
Full Name: Brenda Conner Gender: Female MRN #: [id number] Date of Birth: 01/25/1960    Visit Date: 04/30/2022 12:55 Age: 62 Years Examining Physician: Dr. Levert Feinstein Referring Physician: Dr. Levert Feinstein Height: 5 feet 8 inch History: 62 year old female presenting with plasma cell dyscrasia since 2015, received systemic treatment over the years, now develop bilateral feet paresthesia, and intermittent bilateral finger paresthesia  Summary of the test: Nerve conduction study: Bilateral sural and superficial peroneal sensory responses showed mildly decreased snap amplitude.  Bilateral ulnar sensory responses were normal.  Bilateral median sensory response was absent.  Bilateral radial sensory responses were within normal limit,  Bilateral tibial, peroneal to EDB motor responses were normal.  Bilateral ulnar motor responses were normal.  Bilateral median motor responses showed significantly prolonged distal latency, moderate to severely decreased CMAP amplitude, F-wave latency was significantly prolonged at bilateral median motor response  Electromyography:  Selective needle examinations were performed at right lower extremity muscles, lumbosacral paraspinal muscles, bilateral upper extremity muscles and cervical paraspinal muscles.  There was no significant abnormality found, other than decreased recruitment at bilateral abductor pollicis brevis, with normal morphology motor unit potential, there was no spontaneous activity noted.  There was no spontaneous activity at right lumbosacral, bilateral cervical paraspinal muscles.  Conclusion: This is an abnormal study.  There is electrodiagnostic evidence of bilateral median neuropathy across the wrist, consistent with moderate bilateral carpal tunnel syndromes, there was no evidence of axonal loss.  This happened in the background of slight axonal peripheral neuropathy.    ------------------------------- Levert Feinstein, M.D.  Ph.D.  [facility] [address] [place], [place] [address] Tel: [phone] Fax: [phone]  Verbal informed consent was obtained from the patient, patient was informed of potential risk of procedure, including bruising, bleeding, hematoma formation, infection, muscle weakness, muscle pain, numbness, among others.        MNC    Nerve / Sites Muscle Latency Ref. Amplitude Ref. Rel Amp Segments Distance Velocity Ref. Area    ms ms mV mV %  cm m/s m/s mVms  R Median - APB     Wrist APB 14.5 ?4.4 2.4 ?4.0 100 Wrist - APB 7   9.0     Upper arm APB 20.6  2.3  97.2 Upper arm - Wrist 22 36 ?49 8.1  L Median - APB     Wrist APB 12.3 ?4.4 0.7 ?4.0 100 Wrist - APB 7   2.3     Upper arm APB 15.8  1.4  191 Upper arm - Wrist 22 63 ?49 6.0  R Ulnar - ADM     Wrist ADM 2.8 ?3.3 8.9 ?6.0 100 Wrist - ADM 7   22.1     B.Elbow ADM 5.0  7.0  79.2 B.Elbow - Wrist 12 55 ?49 19.0     A.Elbow ADM 8.1  7.4  104 A.Elbow - B.Elbow 15.6 50 ?49 21.2  L Ulnar - ADM     Wrist ADM 2.6 ?3.3 8.5 ?6.0 100 Wrist - ADM 7   24.8     B.Elbow ADM 5.0  6.8  80 B.Elbow - Wrist 12 49 ?49 19.5     A.Elbow ADM 7.8  8.0  118 A.Elbow - B.Elbow 15 54 ?49 24.0  R Peroneal - EDB     Ankle EDB 4.2 ?6.5 4.2 ?2.0 100 Ankle - EDB 9   14.0     Fib head EDB 11.1  3.4  80.2 Fib head -  Ankle 27.6 40 ?44 13.6     Pop fossa EDB 13.3  3.3  96.9 Pop fossa - Fib head 10 47 ?44 13.3         Pop fossa - Ankle      L Peroneal - EDB     Ankle EDB 4.2 ?6.5 3.2 ?2.0 100 Ankle - EDB 9   10.3     Fib head EDB 9.8  2.5  78.4 Fib head - Ankle 25 44 ?44 7.2     Pop fossa EDB 12.3  2.5  101 Pop fossa - Fib head 11 45 ?44 7.7         Pop fossa - Ankle      R Tibial - AH     Ankle AH 3.8 ?5.8 4.3 ?4.0 100 Ankle - AH 9   11.4     Pop fossa AH 14.5  2.6  60 Pop fossa - Ankle 39 36 ?41 8.8  L Tibial - AH     Ankle AH 4.8 ?5.8 4.3 ?4.0 100 Ankle - AH 9   8.8     Pop fossa AH 15.1  3.7  85.2 Pop fossa - Ankle 42 41 ?41 9.6                      SNC    Nerve / Sites Rec. Site Peak Lat Ref.  Amp Ref. Segments Distance    ms ms V V  cm  R Radial - Anatomical snuff box (Forearm)     Forearm Wrist 2.4 ?2.9 16 ?15 Forearm - Wrist 10  L Radial - Anatomical snuff box (Forearm)     Forearm Wrist 2.6 ?2.9 11 ?15 Forearm - Wrist 10  R Sural - Ankle (Calf)     Calf Ankle 2.8 ?4.4 3 ?6 Calf - Ankle 14  L Sural - Ankle (Calf)     Calf Ankle 3.0 ?4.4 3 ?6 Calf - Ankle 14  R Superficial peroneal - Ankle     Lat leg Ankle 2.9 ?4.4 3 ?6 Lat leg - Ankle 14  L Superficial peroneal - Ankle     Lat leg Ankle 4.6 ?4.4 2 ?6 Lat leg - Ankle 14  R Median - Orthodromic (Dig II, Mid palm)     Dig II Wrist NR ?3.4 NR ?10 Dig II - Wrist 13  L Median - Orthodromic (Dig II, Mid palm)     Dig II Wrist NR ?3.4 NR ?10 Dig II - Wrist 13  R Ulnar - Orthodromic, (Dig V, Mid palm)     Dig V Wrist 2.8 ?3.1 5 ?5 Dig V - Wrist 11  L Ulnar - Orthodromic, (Dig V, Mid palm)     Dig V Wrist 2.6 ?3.1 5 ?5 Dig V - Wrist 28                         F  Wave    Nerve F Lat Ref.   ms ms  R Ulnar - ADM 30.2 ?32.0  R Tibial - AH 59.0 ?56.0  L Tibial - AH 56.8 ?56.0  L Ulnar - ADM 29.8 ?32.0  R Median - APB 46.7 ?31.0  L Median - APB 38.9 ?31.0                 EMG Summary Table    Spontaneous MUAP Recruitment  Muscle IA Fib PSW Fasc Other Amp Dur. Poly Pattern  R. Tibialis anterior Normal None None None _______ Normal Normal Normal Normal  R. Peroneus longus Normal None None None _______ Normal Normal Normal Normal  R. Tibialis posterior Normal None None None _______ Normal Normal Normal Normal  R. Gastrocnemius (Medial head) Normal None None None _______ Normal Normal Normal Normal  R. Lumbar paraspinals Normal None None None _______ Normal Normal Normal Normal  R. Lumbar paraspinals (low) Normal None None None _______ Normal Normal Normal Normal  R. Lumbar paraspinals (mid) Normal None None None _______ Normal Normal Normal Normal  R. First  dorsal interosseous Normal None None None _______ Normal Normal Normal Normal  R. Abductor pollicis brevis Normal None None None _______ Normal Normal Normal Reduced  R. Pronator teres Normal None None None _______ Normal Normal Normal Normal  R. Biceps brachii Normal None None None _______ Normal Normal Normal Normal  R. Deltoid Normal None None None _______ Normal Normal Normal Normal  R. Triceps brachii Normal None None None _______ Normal Normal Normal Normal  L. Abductor pollicis brevis Normal None None None _______ Normal Normal Normal Reduced  L. Extensor digitorum communis Normal None None None _______ Normal Normal Normal Normal  L. Biceps brachii Normal None None None _______ Normal Normal Normal Normal  L. Deltoid Normal None None None _______ Normal Normal Normal Normal  L. Triceps brachii Normal None None None _______ Normal Normal Normal Normal  L. Pronator teres Normal None None None _______ Normal Normal Normal Normal  L. Cervical paraspinals Normal None None None _______ Normal Normal Normal Normal

## 2022-05-07 ENCOUNTER — Ambulatory Visit (HOSPITAL_COMMUNITY)
Admission: RE | Admit: 2022-05-07 | Discharge: 2022-05-07 | Disposition: A | Discharge status: Home / Self Care | Payer: 59 | Source: Ambulatory Visit | Attending: Family Medicine | Admitting: Family Medicine

## 2022-05-07 DIAGNOSIS — G4730 Sleep apnea, unspecified: Secondary | ICD-10-CM | POA: Diagnosis not present

## 2022-05-07 DIAGNOSIS — I119 Hypertensive heart disease without heart failure: Secondary | ICD-10-CM | POA: Insufficient documentation

## 2022-05-07 DIAGNOSIS — I517 Cardiomegaly: Secondary | ICD-10-CM

## 2022-05-07 DIAGNOSIS — E119 Type 2 diabetes mellitus without complications: Secondary | ICD-10-CM | POA: Diagnosis not present

## 2022-05-07 LAB — ECHOCARDIOGRAM COMPLETE
Area-P 1/2: 3.17 cm2
Calc EF: 66.2 %
S' Lateral: 3.5 cm
Single Plane A2C EF: 67.5 %
Single Plane A4C EF: 67.4 %

## 2022-05-07 NOTE — Progress Notes (Signed)
Echocardiogram 2D Echocardiogram has been performed.  Brenda Conner 05/07/2022, 9:47 AM

## 2022-05-08 ENCOUNTER — Encounter (HOSPITAL_BASED_OUTPATIENT_CLINIC_OR_DEPARTMENT_OTHER): Payer: Self-pay | Admitting: Obstetrics & Gynecology

## 2022-05-08 ENCOUNTER — Ambulatory Visit (INDEPENDENT_AMBULATORY_CARE_PROVIDER_SITE_OTHER): Payer: 59 | Admitting: Obstetrics & Gynecology

## 2022-05-08 VITALS — BP 147/71 | HR 75 | Ht 67.0 in | Wt 270.4 lb

## 2022-05-08 DIAGNOSIS — D472 Monoclonal gammopathy: Secondary | ICD-10-CM

## 2022-05-08 DIAGNOSIS — N83201 Unspecified ovarian cyst, right side: Secondary | ICD-10-CM | POA: Diagnosis not present

## 2022-05-08 DIAGNOSIS — Z13820 Encounter for screening for osteoporosis: Secondary | ICD-10-CM

## 2022-05-08 DIAGNOSIS — G629 Polyneuropathy, unspecified: Secondary | ICD-10-CM

## 2022-05-08 DIAGNOSIS — Z01419 Encounter for gynecological examination (general) (routine) without abnormal findings: Secondary | ICD-10-CM

## 2022-05-08 DIAGNOSIS — E349 Endocrine disorder, unspecified: Secondary | ICD-10-CM

## 2022-05-08 DIAGNOSIS — C9000 Multiple myeloma not having achieved remission: Secondary | ICD-10-CM

## 2022-05-08 DIAGNOSIS — F419 Anxiety disorder, unspecified: Secondary | ICD-10-CM

## 2022-05-08 DIAGNOSIS — R239 Unspecified skin changes: Secondary | ICD-10-CM

## 2022-05-08 MED ORDER — ALPRAZOLAM 0.5 MG PO TABS
0.5000 mg | ORAL_TABLET | Freq: Every evening | ORAL | 1 refills | Status: DC | PRN
Start: 2022-05-08 — End: 2022-11-01

## 2022-05-08 MED ORDER — SERTRALINE HCL 50 MG PO TABS: 150.0000 mg | ORAL_TABLET | Freq: Every day | ORAL | 4 refills | Status: DC

## 2022-05-08 NOTE — Progress Notes (Signed)
62 y.o. G0P0000 Single White or Caucasian female here for annual exam.  H/o POEMS syndrome vs multiple myeloma.  Did go and see provider in [place] for second opinion as recommened by Dr Arbutus Ped.  Had echo done yesterday.    On Zoloft 150mg  daily.  Rx completed.  Does have some anxiety with increased number of tests.  She does use occasional xanax.  Needs RF.  Will complete today.  No LMP recorded. (Menstrual status: Perimenopausal).          Sexually active: No.  The current method of family planning is none and post menopausal status.    Smoker:  no  Health Maintenance: Pap:  08/03/2020 Negative History of abnormal Pap:  no MMG:  01/2021 Colonoscopy:  09/07/2021 BMD:   09/19/2016, is due.  Will order at [facility]. Screening Labs: with PCP and oncology   reports that she has never smoked. She has never used smokeless tobacco. She reports that she does not drink alcohol and does not use drugs.  Past Medical History:  Diagnosis Date   Acid reflux    Anemia    Anxiety    Asthma    Cancer (HCC)    waldenstroms/ macroglobinulemia   Depression    Depression    Diabetes mellitus without complication (HCC)    Dysuria 02/28/2016   Gallstones    Heart palpitations    Hypercholesteremia    Hypertension    Hypothyroidism    Macroglobulinemia (HCC)    ? POEMS syndrome   Monoclonal gammopathy of unknown significance (MGUS)    Multiple myeloma (HCC)    Obesity    POEMS syndrome    PONV (postoperative nausea and vomiting)    Sleep apnea    CPAP at bedtime    Past Surgical History:  Procedure Laterality Date   ABLATION  09/09/2007   HTA and polyp resection   BONE MARROW BIOPSY  04/08/2012   BONE MARROW BIOPSY  01/2020   CATARACT EXTRACTION, BILATERAL Bilateral 02/2022   FOOT SURGERY Bilateral 01/09/1996   small toe   KNEE ARTHROSCOPY W/ MENISCAL REPAIR Bilateral 10/10 ; 3/11   LAPAROSCOPIC CHOLECYSTECTOMY  01/09/1995   LUMBAR DISC SURGERY  03/08/2004   herniation, L4-L5    NASAL SINUS SURGERY  01/08/2002   UTERINE FIBROID EMBOLIZATION  2009   HTA and polyp resection    Current Outpatient Medications  Medication Sig Dispense Refill   ACCU-CHEK AVIVA PLUS test strip USE TO CHECK SUGAR TWICE A DAY 90     ALPRAZolam (XANAX) 0.5 MG tablet Take 1 tablet (0.5 mg total) by mouth at bedtime as needed for anxiety. 30 tablet 1   aspirin 81 MG chewable tablet Chew by mouth daily.     Azelastine HCl 0.15 % SOLN as needed.     B-D UF III MINI PEN NEEDLES 31G X 5 MM MISC      Blood Glucose Monitoring Suppl (ONE TOUCH ULTRA MINI) W/DEVICE KIT See admin instructions. Reported on 01/25/2015  0   cetirizine (ZYRTEC ALLERGY) 10 MG tablet Take 1 tablet by mouth daily.     DiphenhydrAMINE HCl (BENADRYL ALLERGY PO) Take 1 Dose by mouth daily at 6 (six) AM.     fexofenadine (ALLEGRA ALLERGY) 180 MG tablet Take 1 tablet by mouth daily.     Fluticasone-Salmeterol (ADVAIR) 100-50 MCG/DOSE AEPB Inhale 2 puffs into the lungs every 12 (twelve) hours.     glucosamine-chondroitin 500-400 MG tablet Take 1 tablet by mouth daily.     ibuprofen (  ADVIL,MOTRIN) 100 MG tablet Take 100 mg by mouth every 6 (six) hours as needed. Reported on 05/31/2015     Insulin Glargine (BASAGLAR KWIKPEN) 100 UNIT/ML Inject 20 Units into the skin at bedtime.     levothyroxine (SYNTHROID) 137 MCG tablet Take 137 mcg by mouth daily.     lisinopril (PRINIVIL,ZESTRIL) 10 MG tablet Take 10 mg by mouth daily. auth number 05/29/2018 [id number]  3   loperamide (IMODIUM) 2 MG capsule Take 2 mg by mouth as needed for diarrhea or loose stools.     magic mouthwash w/lidocaine SOLN Take 5 mLs by mouth 4 (four) times daily as needed for mouth pain. Swish, Gargle, and spit 240 mL 1   Melatonin 5 MG CAPS Take 1 capsule by mouth at bedtime.     metFORMIN (GLUCOPHAGE-XR) 500 MG 24 hr tablet Take 500 mg by mouth daily.     NOVOLOG FLEXPEN 100 UNIT/ML FlexPen Inject 15 Units into the skin in the morning, at noon, and at bedtime. Per  Sliding Scale     omeprazole (PRILOSEC) 20 MG capsule Take 20 mg by mouth 2 (two) times daily.     pravastatin (PRAVACHOL) 80 MG tablet Take 80 mg by mouth daily.     PROAIR HFA 108 (90 Base) MCG/ACT inhaler Reported on 06/21/2015  1   REVLIMID 25 MG capsule TAKE 1 CAPSULE BY MOUTH ONCE DAILY 21 DAYS ON AND 7 DAYS OFF [employer] Auth # [id number]  Date Obtained 06/26/2021. Adult female NOT of childbearing potential 21 capsule 1   sertraline (ZOLOFT) 50 MG tablet TAKE 3 TABLETS BY MOUTH EVERY DAY 90 tablet 0   verapamil (CALAN-SR) 240 MG CR tablet Take 240 mg by mouth 2 (two) times daily.     No current facility-administered medications for this visit.    Family History  Problem Relation Age of Onset   Colon polyps Mother    Stroke Mother    Heart disease Mother        heart cath   Asthma Mother    Endometriosis Mother    Hypertension Mother    Hyperlipidemia Mother    Hyperlipidemia Father    Heart disease Father    Colon polyps Sister    Leukemia Maternal Grandmother    Heart disease Paternal Grandmother    Esophageal cancer Neg Hx    Rectal cancer Neg Hx    Stomach cancer Neg Hx     ROS: Constitutional: negative Genitourinary:negative  Exam:   BP (!) 147/71 (BP Location: Right Arm, Patient Position: Sitting, Cuff Size: Large)   Pulse 75   Ht 5\' 7"  (1.702 m) Comment: Reported  Wt 270 lb 6.4 oz (122.7 kg)   BMI 42.35 kg/m   Height: 5\' 7"  (170.2 cm) (Reported)  General appearance: alert, cooperative and appears stated age Head: Normocephalic, without obvious abnormality, atraumatic Neck: no adenopathy, supple, symmetrical, trachea midline and thyroid normal to inspection and palpation Lungs: clear to auscultation bilaterally Breasts: normal appearance, no masses or tenderness Heart: regular rate and rhythm Abdomen: soft, non-tender; bowel sounds normal; no masses,  no organomegaly Extremities: extremities normal, atraumatic, no cyanosis or edema Skin: Skin color, texture,  turgor normal. No rashes or lesions Lymph nodes: Cervical, supraclavicular, and axillary nodes normal. No abnormal inguinal nodes palpated Neurologic: Grossly normal   Pelvic: External genitalia:  no lesions              Urethra:  normal appearing urethra with no masses, tenderness or lesions  Bartholins and Skenes: normal                 Vagina: normal appearing vagina with normal color and no discharge, no lesions              Cervix: no lesions              Pap taken: No. Bimanual Exam:  Uterus:  normal size, contour, position, consistency, mobility, non-tender              Adnexa: normal adnexa and no mass, fullness, tenderness               Rectovaginal: Confirms               Anus:  normal sphincter tone, no lesions  Chaperone, Ina Homes, CMA, was present for exam.  Assessment/Plan: 1. Well woman exam with routine gynecological exam - Pap smear neg 07/2020.  Will repeat next year.   - Mammogram release signed - Colonoscopy 08/2021 - Bone mineral density ordered - vaccines reviewed/updated  2. Cyst of right ovary - US PELVIS TRANSVAGINAL NON-OB (TV ONLY); Future  3. Osteoporosis screening - DG BONE DENSITY (DXA); Future  4. POEMS syndrome  5. Multiple myeloma, remission status unspecified (HCC)  6. Anxiety - sertraline (ZOLOFT) 50 MG tablet; Take 3 tablets (150 mg total) by mouth daily.  Dispense: 90 tablet; Refill: 4 - ALPRAZolam (XANAX) 0.5 MG tablet; Take 1 tablet (0.5 mg total) by mouth at bedtime as needed for anxiety.  Dispense: 30 tablet; Refill: 1

## 2022-05-16 ENCOUNTER — Ambulatory Visit (HOSPITAL_BASED_OUTPATIENT_CLINIC_OR_DEPARTMENT_OTHER): Payer: 59 | Admitting: Obstetrics & Gynecology

## 2022-05-17 ENCOUNTER — Encounter (HOSPITAL_BASED_OUTPATIENT_CLINIC_OR_DEPARTMENT_OTHER): Payer: Self-pay | Admitting: *Deleted

## 2022-05-24 ENCOUNTER — Other Ambulatory Visit (HOSPITAL_BASED_OUTPATIENT_CLINIC_OR_DEPARTMENT_OTHER): Payer: Self-pay | Admitting: *Deleted

## 2022-05-24 DIAGNOSIS — Z13820 Encounter for screening for osteoporosis: Secondary | ICD-10-CM

## 2022-05-30 ENCOUNTER — Ambulatory Visit (INDEPENDENT_AMBULATORY_CARE_PROVIDER_SITE_OTHER): Payer: 59

## 2022-05-30 ENCOUNTER — Ambulatory Visit (INDEPENDENT_AMBULATORY_CARE_PROVIDER_SITE_OTHER): Payer: 59 | Admitting: Obstetrics & Gynecology

## 2022-05-30 VITALS — Ht 67.0 in | Wt 269.0 lb

## 2022-05-30 DIAGNOSIS — Z6841 Body Mass Index (BMI) 40.0 and over, adult: Secondary | ICD-10-CM | POA: Diagnosis not present

## 2022-05-30 DIAGNOSIS — N83201 Unspecified ovarian cyst, right side: Secondary | ICD-10-CM | POA: Diagnosis not present

## 2022-06-05 ENCOUNTER — Encounter (HOSPITAL_BASED_OUTPATIENT_CLINIC_OR_DEPARTMENT_OTHER): Payer: Self-pay | Admitting: Obstetrics & Gynecology

## 2022-06-05 DIAGNOSIS — N83201 Unspecified ovarian cyst, right side: Secondary | ICD-10-CM | POA: Insufficient documentation

## 2022-06-05 NOTE — Progress Notes (Signed)
GYNECOLOGY  VISIT  CC:   f/u after ultrasound   HPI: 62 y.o. G0P0000 Single White or Caucasian female here for discussion of ultrasound results that were done for additional evaluation of 4.2cm cyst noted on PET scan 03/08/2022.  This showed:  Hypodense homogeneous 4.2 cm right ovarian cyst does not demonstrate abnormal FDG avidity.    Pt denies vaginal bleeding or pelvis pain.  Ultrasound today could not be done vaginally due to small introitus.  It was done transabdominally.  Sub optimal imaging did occur today.  However, the right ovarian cyst was seen and measures 4.5 x 3.9 x 3.6cm.  It is avascular.  Follow up every 6 months for two years discussed.  This does appear completely benign.  We agreed on doing this for the next year and then reassessing at that time.  Pt comfortable with plan.   Past Medical History:  Diagnosis Date   Acid reflux    Anemia    Anxiety    Asthma    Cancer (HCC)    waldenstroms/ macroglobinulemia   Depression    Depression    Diabetes mellitus without complication (HCC)    Dysuria 02/28/2016   Gallstones    Heart palpitations    Hypercholesteremia    Hypertension    Hypothyroidism    Macroglobulinemia (HCC)    ? POEMS syndrome   Monoclonal gammopathy of unknown significance (MGUS)    Multiple myeloma (HCC)    Obesity    POEMS syndrome    PONV (postoperative nausea and vomiting)    Sleep apnea    CPAP at bedtime    MEDS:   Current Outpatient Medications on File Prior to Visit  Medication Sig Dispense Refill   ACCU-CHEK AVIVA PLUS test strip USE TO CHECK SUGAR TWICE A DAY 90     ALPRAZolam (XANAX) 0.5 MG tablet Take 1 tablet (0.5 mg total) by mouth at bedtime as needed for anxiety. 30 tablet 1   aspirin 81 MG chewable tablet Chew by mouth daily.     Azelastine HCl 0.15 % SOLN as needed.     B-D UF III MINI PEN NEEDLES 31G X 5 MM MISC      Blood Glucose Monitoring Suppl (ONE TOUCH ULTRA MINI) W/DEVICE KIT See admin instructions. Reported on  01/25/2015  0   cetirizine (ZYRTEC ALLERGY) 10 MG tablet Take 1 tablet by mouth daily.     DiphenhydrAMINE HCl (BENADRYL ALLERGY PO) Take 1 Dose by mouth daily at 6 (six) AM.     fexofenadine (ALLEGRA ALLERGY) 180 MG tablet Take 1 tablet by mouth daily.     Fluticasone-Salmeterol (ADVAIR) 100-50 MCG/DOSE AEPB Inhale 2 puffs into the lungs every 12 (twelve) hours.     glucosamine-chondroitin 500-400 MG tablet Take 1 tablet by mouth daily.     ibuprofen (ADVIL,MOTRIN) 100 MG tablet Take 100 mg by mouth every 6 (six) hours as needed. Reported on 05/31/2015     Insulin Glargine (BASAGLAR KWIKPEN) 100 UNIT/ML Inject 20 Units into the skin at bedtime.     levothyroxine (SYNTHROID) 137 MCG tablet Take 137 mcg by mouth daily.     lisinopril (PRINIVIL,ZESTRIL) 10 MG tablet Take 10 mg by mouth daily. auth number 05/29/2018 [id number]  3   loperamide (IMODIUM) 2 MG capsule Take 2 mg by mouth as needed for diarrhea or loose stools.     magic mouthwash w/lidocaine SOLN Take 5 mLs by mouth 4 (four) times daily as needed for mouth pain. Swish, Gargle, and spit  240 mL 1   Melatonin 5 MG CAPS Take 1 capsule by mouth at bedtime.     metFORMIN (GLUCOPHAGE-XR) 500 MG 24 hr tablet Take 500 mg by mouth daily.     NOVOLOG FLEXPEN 100 UNIT/ML FlexPen Inject 15 Units into the skin in the morning, at noon, and at bedtime. Per Sliding Scale     omeprazole (PRILOSEC) 20 MG capsule Take 20 mg by mouth 2 (two) times daily.     pravastatin (PRAVACHOL) 80 MG tablet Take 80 mg by mouth daily.     PROAIR HFA 108 (90 Base) MCG/ACT inhaler Reported on 06/21/2015  1   REVLIMID 25 MG capsule TAKE 1 CAPSULE BY MOUTH ONCE DAILY 21 DAYS ON AND 7 DAYS OFF [employer] Auth # [id number]  Date Obtained 06/26/2021. Adult female NOT of childbearing potential 21 capsule 1   sertraline (ZOLOFT) 50 MG tablet Take 3 tablets (150 mg total) by mouth daily. 90 tablet 4   verapamil (CALAN-SR) 240 MG CR tablet Take 240 mg by mouth 2 (two) times daily.     No  current facility-administered medications on file prior to visit.    ALLERGIES: Codeine, Hydrocodone, Levothyroxine sodium, Lortab [hydrocodone-acetaminophen], Onion, Shellfish allergy, and Amoxicillin  SH:  single, non smoker  Review of Systems  Constitutional: Negative.   Genitourinary: Negative.     PHYSICAL EXAMINATION:    Ht 5\' 7"  (1.702 m)   Wt 269 lb (122 kg)   BMI 42.13 kg/m     General appearance: alert, cooperative and appears stated age  Assessment/Plan: 1. Cyst of right ovary - will repeat imaging (transabdominally only) in 6 months. - US PELVIS (TRANSABDOMINAL ONLY); Future  2. BMI 40.0-44.9, adult (HCC)

## 2022-06-27 ENCOUNTER — Ambulatory Visit (HOSPITAL_BASED_OUTPATIENT_CLINIC_OR_DEPARTMENT_OTHER): Payer: 59 | Admitting: Obstetrics & Gynecology

## 2022-06-27 ENCOUNTER — Other Ambulatory Visit (HOSPITAL_BASED_OUTPATIENT_CLINIC_OR_DEPARTMENT_OTHER): Payer: 59

## 2022-08-15 ENCOUNTER — Other Ambulatory Visit (HOSPITAL_BASED_OUTPATIENT_CLINIC_OR_DEPARTMENT_OTHER): Payer: Self-pay | Admitting: Obstetrics & Gynecology

## 2022-08-15 DIAGNOSIS — F419 Anxiety disorder, unspecified: Secondary | ICD-10-CM

## 2022-09-14 ENCOUNTER — Telehealth: Payer: Self-pay | Admitting: Medical Oncology

## 2022-09-14 NOTE — Telephone Encounter (Signed)
His note is on [employer] . Dr Marissa Calamity would like Brenda Conner to start tx with [name] , pomalyst, dex. Her light  chains are elevated to approx 1400mg  .  Schedule message sent for labs and f/u with [facility].

## 2022-09-18 ENCOUNTER — Other Ambulatory Visit: Payer: Self-pay | Admitting: Nurse Practitioner

## 2022-09-18 DIAGNOSIS — K7581 Nonalcoholic steatohepatitis (NASH): Secondary | ICD-10-CM

## 2022-09-18 DIAGNOSIS — K7469 Other cirrhosis of liver: Secondary | ICD-10-CM

## 2022-09-19 ENCOUNTER — Inpatient Hospital Stay (HOSPITAL_BASED_OUTPATIENT_CLINIC_OR_DEPARTMENT_OTHER): Payer: 59 | Admitting: Internal Medicine

## 2022-09-19 ENCOUNTER — Inpatient Hospital Stay: Payer: 59 | Attending: Internal Medicine

## 2022-09-19 ENCOUNTER — Telehealth: Payer: Self-pay | Admitting: Pharmacy Technician

## 2022-09-19 ENCOUNTER — Encounter: Payer: Self-pay | Admitting: Internal Medicine

## 2022-09-19 ENCOUNTER — Other Ambulatory Visit: Payer: Self-pay | Admitting: Medical Oncology

## 2022-09-19 ENCOUNTER — Other Ambulatory Visit (HOSPITAL_COMMUNITY): Payer: Self-pay

## 2022-09-19 ENCOUNTER — Inpatient Hospital Stay: Payer: 59

## 2022-09-19 ENCOUNTER — Other Ambulatory Visit: Payer: Self-pay | Admitting: Physician Assistant

## 2022-09-19 ENCOUNTER — Telehealth: Payer: Self-pay | Admitting: Pharmacist

## 2022-09-19 VITALS — BP 138/71 | HR 65 | Temp 99.9°F | Resp 16 | Ht 67.0 in | Wt 271.0 lb

## 2022-09-19 DIAGNOSIS — Z7952 Long term (current) use of systemic steroids: Secondary | ICD-10-CM | POA: Diagnosis not present

## 2022-09-19 DIAGNOSIS — Z79890 Hormone replacement therapy (postmenopausal): Secondary | ICD-10-CM | POA: Diagnosis not present

## 2022-09-19 DIAGNOSIS — G629 Polyneuropathy, unspecified: Secondary | ICD-10-CM | POA: Diagnosis not present

## 2022-09-19 DIAGNOSIS — Z7969 Long term (current) use of other immunomodulators and immunosuppressants: Secondary | ICD-10-CM | POA: Insufficient documentation

## 2022-09-19 DIAGNOSIS — Z885 Allergy status to narcotic agent status: Secondary | ICD-10-CM | POA: Insufficient documentation

## 2022-09-19 DIAGNOSIS — G4700 Insomnia, unspecified: Secondary | ICD-10-CM | POA: Diagnosis not present

## 2022-09-19 DIAGNOSIS — R239 Unspecified skin changes: Secondary | ICD-10-CM | POA: Insufficient documentation

## 2022-09-19 DIAGNOSIS — C9000 Multiple myeloma not having achieved remission: Secondary | ICD-10-CM

## 2022-09-19 DIAGNOSIS — D509 Iron deficiency anemia, unspecified: Secondary | ICD-10-CM | POA: Diagnosis not present

## 2022-09-19 DIAGNOSIS — I10 Essential (primary) hypertension: Secondary | ICD-10-CM | POA: Insufficient documentation

## 2022-09-19 DIAGNOSIS — R5383 Other fatigue: Secondary | ICD-10-CM | POA: Diagnosis not present

## 2022-09-19 DIAGNOSIS — Z7961 Long term (current) use of immunomodulator: Secondary | ICD-10-CM | POA: Diagnosis not present

## 2022-09-19 DIAGNOSIS — Z79899 Other long term (current) drug therapy: Secondary | ICD-10-CM | POA: Diagnosis not present

## 2022-09-19 DIAGNOSIS — Z79624 Long term (current) use of inhibitors of nucleotide synthesis: Secondary | ICD-10-CM | POA: Insufficient documentation

## 2022-09-19 DIAGNOSIS — Z880 Allergy status to penicillin: Secondary | ICD-10-CM | POA: Diagnosis not present

## 2022-09-19 DIAGNOSIS — E349 Endocrine disorder, unspecified: Secondary | ICD-10-CM | POA: Insufficient documentation

## 2022-09-19 DIAGNOSIS — Z9049 Acquired absence of other specified parts of digestive tract: Secondary | ICD-10-CM | POA: Diagnosis not present

## 2022-09-19 DIAGNOSIS — D472 Monoclonal gammopathy: Secondary | ICD-10-CM | POA: Diagnosis present

## 2022-09-19 LAB — CBC WITH DIFFERENTIAL (CANCER CENTER ONLY)
Abs Immature Granulocytes: 0.02 10*3/uL (ref 0.00–0.07)
Basophils Absolute: 0 10*3/uL (ref 0.0–0.1)
Basophils Relative: 1 %
Eosinophils Absolute: 0.1 10*3/uL (ref 0.0–0.5)
Eosinophils Relative: 2 %
HCT: 29.7 % — ABNORMAL LOW (ref 36.0–46.0)
Hemoglobin: 9.5 g/dL — ABNORMAL LOW (ref 12.0–15.0)
Immature Granulocytes: 1 %
Lymphocytes Relative: 21 %
Lymphs Abs: 0.7 10*3/uL (ref 0.7–4.0)
MCH: 26.2 pg (ref 26.0–34.0)
MCHC: 32 g/dL (ref 30.0–36.0)
MCV: 81.8 fL (ref 80.0–100.0)
Monocytes Absolute: 0.2 10*3/uL (ref 0.1–1.0)
Monocytes Relative: 7 %
Neutro Abs: 2.3 10*3/uL (ref 1.7–7.7)
Neutrophils Relative %: 68 %
Platelet Count: 101 10*3/uL — ABNORMAL LOW (ref 150–400)
RBC: 3.63 MIL/uL — ABNORMAL LOW (ref 3.87–5.11)
RDW: 16 % — ABNORMAL HIGH (ref 11.5–15.5)
WBC Count: 3.3 10*3/uL — ABNORMAL LOW (ref 4.0–10.5)
nRBC: 0 % (ref 0.0–0.2)

## 2022-09-19 LAB — TYPE AND SCREEN
ABO/RH(D): B NEG
Antibody Screen: NEGATIVE

## 2022-09-19 LAB — CMP (CANCER CENTER ONLY)
ALT: 21 U/L (ref 0–44)
AST: 36 U/L (ref 15–41)
Albumin: 4.2 g/dL (ref 3.5–5.0)
Alkaline Phosphatase: 118 U/L (ref 38–126)
Anion gap: 7 (ref 5–15)
BUN: 12 mg/dL (ref 8–23)
CO2: 25 mmol/L (ref 22–32)
Calcium: 9.2 mg/dL (ref 8.9–10.3)
Chloride: 103 mmol/L (ref 98–111)
Creatinine: 0.7 mg/dL (ref 0.44–1.00)
GFR, Estimated: >60 mL/min (ref >60)
Glucose, Bld: 129 mg/dL — ABNORMAL HIGH (ref 70–99)
Potassium: 4 mmol/L (ref 3.5–5.1)
Sodium: 135 mmol/L (ref 135–145)
Total Bilirubin: 0.5 mg/dL (ref 0.3–1.2)
Total Protein: 7.3 g/dL (ref 6.5–8.1)

## 2022-09-19 LAB — PRETREATMENT RBC PHENOTYPE

## 2022-09-19 LAB — ABO/RH: ABO/RH(D): B NEG

## 2022-09-19 MED ORDER — PROCHLORPERAZINE MALEATE 10 MG PO TABS: 10.0000 mg | ORAL_TABLET | Freq: Four times a day (QID) | ORAL | 1 refills | Status: AC | PRN

## 2022-09-19 MED ORDER — POMALIDOMIDE 3 MG PO CAPS
3.0000 mg | ORAL_CAPSULE | Freq: Every day | ORAL | 0 refills | Status: AC
Start: 2022-09-19 — End: ?

## 2022-09-19 MED ORDER — ONDANSETRON HCL 8 MG PO TABS: 8.0000 mg | ORAL_TABLET | Freq: Three times a day (TID) | ORAL | 1 refills | Status: DC | PRN

## 2022-09-19 MED ORDER — DEXAMETHASONE 4 MG PO TABS: 20.0000 mg | ORAL_TABLET | ORAL | 11 refills | Status: AC

## 2022-09-19 MED ORDER — ACYCLOVIR 400 MG PO TABS: 400.0000 mg | ORAL_TABLET | Freq: Two times a day (BID) | ORAL | 11 refills | Status: DC

## 2022-09-19 NOTE — Telephone Encounter (Signed)
Oral Oncology Pharmacist Encounter  Received new prescription for Pomalyst (pomalidomide) for the treatment of R/R multiple myeloma in conjunction with daratumumab and dexamethasone, planned duration until disease progression or unacceptable drug toxicity.  CBC w/ Diff and CMP from 09/19/22 assessed, noted pt WBC 3.3 K/uL and pltc of 101 K/uL. Patient starting on dose reduction of Pomalyst per MD. Prescription dose and frequency assessed for appropriateness.  Current medication list in Epic reviewed, DDIs with Pomalyst identified: Category C drug-drug interaction between Pomalyst with benadryl, cetirizine, fexofenadine, prochlorperazine and xanax due to increased risk of CNS depression. Recommend monitoring patient for increased fatigue. No changes in therapy warranted at this time.   Evaluated chart and no patient barriers to medication adherence noted.   Patient agreement for treatment documented in MD note on 09/19/22.  Oral Oncology Clinic will continue to follow for insurance authorization, copayment issues, initial counseling and start date.  Lenord Carbo, PharmD, BCPS, [facility] H[facility]ology/Oncology Clinical Pharmacist [facility] Oral Chemotherapy Navigation Clinics [phone] 09/19/2022 11:19 AM

## 2022-09-19 NOTE — Progress Notes (Signed)
DISCONTINUE ON PATHWAY REGIMEN - Multiple Myeloma     A cycle is every 21 days:     Bortezomib        Dose Mod: None     Dexamethasone        Dose Mod: None  **Always confirm dose/schedule in your pharmacy ordering system**  REASON: Disease Progression PRIOR TREATMENT: [id number]: Vd (Bortezomib 1.3 mg/m2 Subcut D1, 8, 15 + Dexamethasone 40 mg) q21 Days Until Progression or Unacceptable Toxicity TREATMENT RESPONSE: Stable Disease (SD)  START ON PATHWAY REGIMEN - Multiple Myeloma and Other Plasma Cell Dyscrasias     Cycles 1 and 2: A cycle is every 28 days:     Pomalidomide      Dexamethasone      Daratumumab and hyaluronidase-fihj    Cycles 3 through 6: A cycle is every 28 days:     Pomalidomide      Dexamethasone      Dexamethasone      Daratumumab and hyaluronidase-fihj    Cycles 7 and beyond: A cycle is every 28 days:     Pomalidomide      Dexamethasone      Dexamethasone      Daratumumab and hyaluronidase-fihj   **Always confirm dose/schedule in your pharmacy ordering system**  Patient Characteristics: Multiple Myeloma, Relapsed / Refractory, Second through Fourth Lines of Therapy, Not a Candidate for CAR T-cell Therapy, Fit or Candidate for Triplet Therapy, Lenalidomide-Refractory or Lenalidomide-based Regimen Not Preferred, Candidate for Anti-CD38  Antibody Disease Classification: Multiple Myeloma Therapeutic Status: Relapsed R2-ISS Staging: II Line of Therapy: Second Line Anti-CD38 Antibody Candidacy: Candidate for Anti-CD38 Antibody Lenalidomide-based Regimen Preference/Candidacy: Lenalidomide-based Regimen Not Preferred Intent of Therapy: Non-Curative / Palliative Intent, Discussed with Patient

## 2022-09-19 NOTE — Telephone Encounter (Signed)
Oral Oncology Patient Advocate Encounter  Prior Authorization for pomalidomide has been approved.    PA# [id number] Effective dates: 09/19/22 through 09/19/23  Patient must fill at [facility].    Jinger Neighbors, CPhT-Adv Oncology Pharmacy Patient Advocate [facility] Direct Number: [phone]  Fax: [phone]

## 2022-09-19 NOTE — Progress Notes (Signed)
Pomalyst rx faxed to [facility] with receipt.

## 2022-09-19 NOTE — Progress Notes (Signed)
[facility] [phone]   [phone]  OFFICE PROGRESS NOTE  [name], MD [address] [place] [address]  DIAGNOSIS: Plasma cell dyscrasia initially diagnosed as MGUS in September 2010, with additional symptoms suggestive of POEMS syndrome.   PRIOR THERAPY:  1) Velcade 1.3 MG/M2 subcutaneously with Decadron 40 mg by mouth on a weekly basis. First cycle 11/24/2013. She status post 31 weekly doses of treatment. 2) Velcade 1.3 MG/M2 subcutaneously and weekly basis with Decadron 40 mg by mouth weekly. First dose 02/01/2015. Status post 28 cycles. 3) Revlimid 25 mg by mouth daily for 21 days every 4 weeks with weekly Decadron 20 mg. started in 11/27/2015. Status post 3 cycles discontinued secondary to lack of response. 4) Systemic treatment with Velcade 1.3 MG/KG weekly, Revlimid 25 mg by mouth daily for 21 days every 4 weeks in addition to Decadron 20 mg by mouth weekly. First dose 03/06/2016. Status post 42 cycles.  She has a break off treatment from June 2021 until July 2022.  CURRENT THERAPY: Second line treatment with daratumumab, Pomalyst 3 mg for 21 days every 4 weeks as well as Decadron 20 mg p.o. weekly.  First dose 09/26/2022  INTERVAL HISTORY: Brenda Conner 62 y.o. female returns to the clinic today for follow-up visit.  The patient is feeling fine today with no concerning complaints except for mild fatigue.  She was seen recently by Dr. Marissa Calamity for second opinion at [facility].  He recommended for the patient to start second line treatment with daratumumab Pomalyst and Decadron.  She is here for evaluation and discussion of this treatment option.  She has no current chest pain, shortness of breath, cough or hemoptysis.  She has no nausea, vomiting, diarrhea or constipation.  She has no headache or visual changes.  She denied having any fever or chills.  She denied having any recent weight loss or night  sweats.   MEDICAL HISTORY: Past Medical History:  Diagnosis Date   Acid reflux    Anemia    Anxiety    Asthma    Cancer (HCC)    waldenstroms/ macroglobinulemia   Depression    Depression    Diabetes mellitus without complication (HCC)    Dysuria 02/28/2016   Gallstones    Heart palpitations    Hypercholesteremia    Hypertension    Hypothyroidism    Macroglobulinemia (HCC)    ? POEMS syndrome   Monoclonal gammopathy of unknown significance (MGUS)    Multiple myeloma (HCC)    Obesity    POEMS syndrome    PONV (postoperative nausea and vomiting)    Sleep apnea    CPAP at bedtime    ALLERGIES:  is allergic to codeine, hydrocodone, levothyroxine sodium, lortab [hydrocodone-acetaminophen], onion, shellfish allergy, and amoxicillin.  MEDICATIONS:  Current Outpatient Medications  Medication Sig Dispense Refill   ACCU-CHEK AVIVA PLUS test strip USE TO CHECK SUGAR TWICE A DAY 90     ALPRAZolam (XANAX) 0.5 MG tablet Take 1 tablet (0.5 mg total) by mouth at bedtime as needed for anxiety. 30 tablet 1   aspirin 81 MG chewable tablet Chew by mouth daily.     Azelastine HCl 0.15 % SOLN as needed.     B-D UF III MINI PEN NEEDLES 31G X 5 MM MISC      Blood Glucose Monitoring Suppl (ONE TOUCH ULTRA MINI) W/DEVICE KIT See admin instructions. Reported on 01/25/2015  0  cetirizine (ZYRTEC ALLERGY) 10 MG tablet Take 1 tablet by mouth daily.     DiphenhydrAMINE HCl (BENADRYL ALLERGY PO) Take 1 Dose by mouth daily at 6 (six) AM.     fexofenadine (ALLEGRA ALLERGY) 180 MG tablet Take 1 tablet by mouth daily.     Fluticasone-Salmeterol (ADVAIR) 100-50 MCG/DOSE AEPB Inhale 2 puffs into the lungs every 12 (twelve) hours.     glucosamine-chondroitin 500-400 MG tablet Take 1 tablet by mouth daily.     ibuprofen (ADVIL,MOTRIN) 100 MG tablet Take 100 mg by mouth every 6 (six) hours as needed. Reported on 05/31/2015     Insulin Glargine (BASAGLAR KWIKPEN) 100 UNIT/ML Inject 20 Units into the skin at  bedtime.     levothyroxine (SYNTHROID) 137 MCG tablet Take 137 mcg by mouth daily.     lisinopril (PRINIVIL,ZESTRIL) 10 MG tablet Take 10 mg by mouth daily. auth number 05/29/2018 [id number]  3   loperamide (IMODIUM) 2 MG capsule Take 2 mg by mouth as needed for diarrhea or loose stools.     magic mouthwash w/lidocaine SOLN Take 5 mLs by mouth 4 (four) times daily as needed for mouth pain. Swish, Gargle, and spit 240 mL 1   Melatonin 5 MG CAPS Take 1 capsule by mouth at bedtime.     metFORMIN (GLUCOPHAGE-XR) 500 MG 24 hr tablet Take 500 mg by mouth daily.     NOVOLOG FLEXPEN 100 UNIT/ML FlexPen Inject 15 Units into the skin in the morning, at noon, and at bedtime. Per Sliding Scale     omeprazole (PRILOSEC) 20 MG capsule Take 20 mg by mouth 2 (two) times daily.     pravastatin (PRAVACHOL) 80 MG tablet Take 80 mg by mouth daily.     PROAIR HFA 108 (90 Base) MCG/ACT inhaler Reported on 06/21/2015  1   REVLIMID 25 MG capsule TAKE 1 CAPSULE BY MOUTH ONCE DAILY 21 DAYS ON AND 7 DAYS OFF [employer] Auth # [id number]  Date Obtained 06/26/2021. Adult female NOT of childbearing potential 21 capsule 1   sertraline (ZOLOFT) 50 MG tablet TAKE 3 TABLETS BY MOUTH EVERY DAY 270 tablet 1   verapamil (CALAN-SR) 240 MG CR tablet Take 240 mg by mouth 2 (two) times daily.     No current facility-administered medications for this visit.    SURGICAL HISTORY:  Past Surgical History:  Procedure Laterality Date   ABLATION  09/09/2007   HTA and polyp resection   BONE MARROW BIOPSY  04/08/2012   BONE MARROW BIOPSY  01/2020   CATARACT EXTRACTION, BILATERAL Bilateral 02/2022   FOOT SURGERY Bilateral 01/09/1996   small toe   KNEE ARTHROSCOPY W/ MENISCAL REPAIR Bilateral 10/10 ; 3/11   LAPAROSCOPIC CHOLECYSTECTOMY  01/09/1995   LUMBAR DISC SURGERY  03/08/2004   herniation, L4-L5   NASAL SINUS SURGERY  01/08/2002   UTERINE FIBROID EMBOLIZATION  2009   HTA and polyp resection    REVIEW OF SYSTEMS:  Constitutional:  positive for fatigue Eyes: negative Ears, nose, mouth, throat, and face: negative Respiratory: negative Cardiovascular: negative Gastrointestinal: negative Genitourinary:negative Integument/breast: negative Hematologic/lymphatic: negative Musculoskeletal:negative Neurological: negative Behavioral/Psych: negative Endocrine: negative Allergic/Immunologic: negative   PHYSICAL EXAMINATION: General appearance: alert, cooperative, fatigued, and no distress Head: Normocephalic, without obvious abnormality, atraumatic Neck: no adenopathy Lymph nodes: Cervical, supraclavicular, and axillary nodes normal. Resp: clear to auscultation bilaterally Back: symmetric, no curvature. ROM normal. No CVA tenderness. Cardio: regular rate and rhythm, S1, S2 normal, no murmur, click, rub or gallop GI: soft, non-tender; bowel sounds normal;  no masses,  no organomegaly Extremities: extremities normal, atraumatic, no cyanosis or edema Neurologic: Alert and oriented X 3, normal strength and tone. Normal symmetric reflexes. Normal coordination and gait  ECOG PERFORMANCE STATUS: 1 - Symptomatic but completely ambulatory  Blood pressure 138/71, pulse 65, temperature 99.9 F (37.7 C), temperature source Oral, resp. rate 16, height 5\' 7"  (1.702 m), weight 271 lb (122.9 kg), SpO2 100%.  LABORATORY DATA: Lab Results  Component Value Date   WBC 3.3 (L) 09/19/2022   HGB 9.5 (L) 09/19/2022   HCT 29.7 (L) 09/19/2022   MCV 81.8 09/19/2022   PLT 101 (L) 09/19/2022      Chemistry      Component Value Date/Time   NA 137 02/22/2022 1249   NA 138 11/14/2016 0824   K 4.0 02/22/2022 1249   K 4.3 11/14/2016 0824   CL 105 02/22/2022 1249   CL 104 04/07/2012 1415   CO2 23 02/22/2022 1249   CO2 23 11/14/2016 0824   BUN 8 02/22/2022 1249   BUN 9.1 11/14/2016 0824   CREATININE 0.70 02/22/2022 1249   CREATININE 0.7 11/14/2016 0824      Component Value Date/Time   CALCIUM 9.1 02/22/2022 1249   CALCIUM 9.6  11/14/2016 0824   ALKPHOS 120 02/22/2022 1249   ALKPHOS 94 11/14/2016 0824   AST 29 02/22/2022 1249   AST 28 11/14/2016 0824   ALT 19 02/22/2022 1249   ALT 24 11/14/2016 0824   BILITOT 0.6 02/22/2022 1249   BILITOT 0.41 11/14/2016 0824      ASSESSMENT AND PLAN:  This is a very pleasant 62 years old white female with multiple myeloma with questionable POEMS syndrome symptom.  The patient has been on treatment with subcutaneous weekly Velcade as well as Revlimid and Decadron status post 34 cycles.  She has been tolerating this treatment well. The patient has been on observation for the last several months and she has been doing fine with no concerning issues except for the recent upper respiratory infection and viral gastroenteritis. Her myeloma panel showed continuous increase in her free lambda light chain. The patient had a bone marrow biopsy and aspirate recently that showed 3-5% plasma cells still suspicious for plasma cell dyscrasia.  The skeletal bone survey was negative for any lytic lesions. The patient has been off treatment for more than a year between June 2021 until July 2022. She had repeat myeloma panel that showed significant worsening and increase of her lambda light chain.  She continues to have anemia but no renal insufficiency or hypercalcemia. She resumed her treatment with Velcade, Revlimid and Decadron on July 19, 2020.   The patient is currently on observation and she is feeling fine with no concerning complaints. She was seen recently by Dr. Marissa Calamity at [facility] for second opinion and after extensive investigation is recommended for her to resume her treatment for the multiple myeloma with daratumumab, Pomalyst 3 Mg p.o. daily for 21 days every 4 weeks and Decadron 20 mg weekly. I discussed the care plan with the patient today and she would like to proceed with the treatment as planned.  She is expected to start the first dose of her treatment next week. I  discussed with her the adverse effect of this treatment. She will come back for follow-up visit in 2 weeks for evaluation and management of any adverse effect of her treatment. For the iron deficiency anemia, I will give her a refill of Integra +1 capsule p.o. daily. The  patient was advised to call immediately if she has any other concerning symptoms in the interval.  All questions were answered. The patient knows to call the clinic with any problems, questions or concerns. We can certainly see the patient much sooner if necessary.  Disclaimer: This note was dictated with voice recognition software. Similar sounding words can inadvertently be transcribed and may not be corrected upon review.

## 2022-09-20 ENCOUNTER — Encounter: Payer: Self-pay | Admitting: Medical Oncology

## 2022-09-20 ENCOUNTER — Telehealth: Payer: Self-pay | Admitting: Medical Oncology

## 2022-09-20 LAB — KAPPA/LAMBDA LIGHT CHAINS
Kappa free light chain: 14.8 mg/L (ref 3.3–19.4)
Kappa, lambda light chain ratio: 0.01 — ABNORMAL LOW (ref 0.26–1.65)
Lambda free light chains: 1650.2 mg/L — ABNORMAL HIGH (ref 5.7–26.3)

## 2022-09-20 LAB — BETA 2 MICROGLOBULIN, SERUM: Beta-2 Microglobulin: 2.1 mg/L (ref 0.6–2.4)

## 2022-09-20 NOTE — Telephone Encounter (Signed)
Oral Chemotherapy Pharmacist Encounter  I spoke with patient for overview of: Pomalyst (pomalidomide) for the treatment of relapsed multiple myeloma in conjunction with daratumumab and dexamethasone, planned duration until disease progression or unacceptable toxicity.   Counseled patient on administration, dosing, side effects, monitoring, drug-food interactions, safe handling, storage, and disposal.  Patient will take Pomalyst 3mg  capsules, 1 capsule by mouth once daily, without regard to food, with a full glass of water.  Pomalyst will be given 21 days on, 7 days off, repeat every 28 days.  Pomalyst start date: pending - patient knows not to start until the first day of her treatment cycle  Adverse effects of Pomalyst include but are not limited to: GI upset, rash, fatigue, peripheral edema, and decreased blood counts.   VTE PPX: Patient endorses continuing to take aspirin 81 mg daily - this is adequate VTE PPX for Pomalyst.  VZV PPX: Patient will pick up her acyclovir from the pharmacy and start this a few days prior to treatment.   Patient will pick up dexamethasone prescription from her pharmacy - she knows to wait until cycle 1 day 1 prior to starting dexamethasone.  Reviewed with patient importance of keeping a medication schedule and plan for any missed doses. No barriers to medication adherence identified.  Medication reconciliation performed and medication/allergy list updated.   Insurance authorization for [employer] has been obtained.  Pomalyst prescription is being dispensed from [facility] as it is a limited distribution medication. Patient has already set up this shipment and will have it in hand on 09/26/22.  All questions answered.  Ms. Bauder voiced understanding and appreciation.   Medication education handout placed in mail for patient. Patient knows to call the office with questions or concerns. Oral Chemotherapy Clinic phone number provided to patient.    Lenord Carbo, PharmD, BCPS, [facility] Hematology/Oncology Clinical Pharmacist [facility] Oral Chemotherapy Navigation Clinics [phone] 09/20/2022 4:17 PM

## 2022-09-20 NOTE — Telephone Encounter (Signed)
LVM on Brenda Conner's mobile phone to return call regarding her menopause hx.

## 2022-09-21 ENCOUNTER — Other Ambulatory Visit: Payer: Self-pay

## 2022-09-21 ENCOUNTER — Ambulatory Visit
Admission: RE | Admit: 2022-09-21 | Discharge: 2022-09-21 | Disposition: A | Discharge status: Home / Self Care | Payer: 59 | Source: Ambulatory Visit | Attending: Nurse Practitioner | Admitting: Nurse Practitioner

## 2022-09-21 ENCOUNTER — Telehealth: Payer: Self-pay | Admitting: Internal Medicine

## 2022-09-21 DIAGNOSIS — K7581 Nonalcoholic steatohepatitis (NASH): Secondary | ICD-10-CM

## 2022-09-21 DIAGNOSIS — K7469 Other cirrhosis of liver: Secondary | ICD-10-CM

## 2022-09-21 NOTE — Telephone Encounter (Signed)
Scheduled per los and work-queue, patient has been called and voicemail was left\ regarding upcoming appointment.

## 2022-09-25 ENCOUNTER — Other Ambulatory Visit: Payer: Self-pay

## 2022-09-26 ENCOUNTER — Inpatient Hospital Stay: Payer: 59

## 2022-09-26 VITALS — BP 143/63 | HR 63 | Temp 98.1°F | Resp 16 | Wt 269.2 lb

## 2022-09-26 DIAGNOSIS — C9000 Multiple myeloma not having achieved remission: Secondary | ICD-10-CM

## 2022-09-26 DIAGNOSIS — D472 Monoclonal gammopathy: Secondary | ICD-10-CM | POA: Diagnosis not present

## 2022-09-26 LAB — CBC WITH DIFFERENTIAL (CANCER CENTER ONLY)
Abs Immature Granulocytes: 0.01 10*3/uL (ref 0.00–0.07)
Basophils Absolute: 0 10*3/uL (ref 0.0–0.1)
Basophils Relative: 1 %
Eosinophils Absolute: 0 10*3/uL (ref 0.0–0.5)
Eosinophils Relative: 1 %
HCT: 31.7 % — ABNORMAL LOW (ref 36.0–46.0)
Hemoglobin: 9.8 g/dL — ABNORMAL LOW (ref 12.0–15.0)
Immature Granulocytes: 0 %
Lymphocytes Relative: 23 %
Lymphs Abs: 0.9 10*3/uL (ref 0.7–4.0)
MCH: 25.7 pg — ABNORMAL LOW (ref 26.0–34.0)
MCHC: 30.9 g/dL (ref 30.0–36.0)
MCV: 83 fL (ref 80.0–100.0)
Monocytes Absolute: 0.3 10*3/uL (ref 0.1–1.0)
Monocytes Relative: 8 %
Neutro Abs: 2.6 10*3/uL (ref 1.7–7.7)
Neutrophils Relative %: 67 %
Platelet Count: 106 10*3/uL — ABNORMAL LOW (ref 150–400)
RBC: 3.82 MIL/uL — ABNORMAL LOW (ref 3.87–5.11)
RDW: 15.9 % — ABNORMAL HIGH (ref 11.5–15.5)
WBC Count: 3.9 10*3/uL — ABNORMAL LOW (ref 4.0–10.5)
nRBC: 0 % (ref 0.0–0.2)

## 2022-09-26 MED ORDER — DEXAMETHASONE 4 MG PO TABS
20.0000 mg | ORAL_TABLET | Freq: Once | ORAL | Status: AC
  Administered 2022-09-26: 20 mg via ORAL
  Filled 2022-09-26: qty 5

## 2022-09-26 MED ORDER — DARATUMUMAB-HYALURONIDASE-FIHJ 1800-30000 MG-UT/15ML ~~LOC~~ SOLN
1800.0000 mg | Freq: Once | SUBCUTANEOUS | Status: AC
  Administered 2022-09-26: 1800 mg via SUBCUTANEOUS
  Filled 2022-09-26: qty 15

## 2022-09-26 MED ORDER — ACETAMINOPHEN 325 MG PO TABS
650.0000 mg | ORAL_TABLET | Freq: Once | ORAL | Status: AC
  Administered 2022-09-26: 650 mg via ORAL
  Filled 2022-09-26: qty 2

## 2022-09-26 MED ORDER — DIPHENHYDRAMINE HCL 25 MG PO CAPS
50.0000 mg | ORAL_CAPSULE | Freq: Once | ORAL | Status: AC
  Administered 2022-09-26: 50 mg via ORAL
  Filled 2022-09-26: qty 2

## 2022-09-26 MED ORDER — MONTELUKAST SODIUM 10 MG PO TABS
10.0000 mg | ORAL_TABLET | Freq: Once | ORAL | Status: AC
  Administered 2022-09-26: 10 mg via ORAL
  Filled 2022-09-26: qty 1

## 2022-09-26 NOTE — Patient Instructions (Signed)
[place] [facility]  Discharge Instructions: Thank you for choosing [facility] to provide your oncology and hematology care.   If you have a lab appointment with the Cancer Center, please go directly to the Cancer Center and check in at the registration area.   Wear comfortable clothing and clothing appropriate for easy access to any Portacath or PICC line.   We strive to give you quality time with your provider. You may need to reschedule your appointment if you arrive late (15 or more minutes).  Arriving late affects you and other patients whose appointments are after yours.  Also, if you miss three or more appointments without notifying the office, you may be dismissed from the clinic at the provider's discretion.      For prescription refill requests, have your pharmacy contact our office and allow 72 hours for refills to be completed.    Today you received the following chemotherapy and/or immunotherapy agents: Darzalex Faspro      To help prevent nausea and vomiting after your treatment, we encourage you to take your nausea medication as directed.  BELOW ARE SYMPTOMS THAT SHOULD BE REPORTED IMMEDIATELY: *FEVER GREATER THAN 100.4 F (38 C) OR HIGHER *CHILLS OR SWEATING *NAUSEA AND VOMITING THAT IS NOT CONTROLLED WITH YOUR NAUSEA MEDICATION *UNUSUAL SHORTNESS OF BREATH *UNUSUAL BRUISING OR BLEEDING *URINARY PROBLEMS (pain or burning when urinating, or frequent urination) *BOWEL PROBLEMS (unusual diarrhea, constipation, pain near the anus) TENDERNESS IN MOUTH AND THROAT WITH OR WITHOUT PRESENCE OF ULCERS (sore throat, sores in mouth, or a toothache) UNUSUAL RASH, SWELLING OR PAIN  UNUSUAL VAGINAL DISCHARGE OR ITCHING   Items with * indicate a potential emergency and should be followed up as soon as possible or go to the Emergency Department if any problems should occur.  Please show the CHEMOTHERAPY ALERT CARD or IMMUNOTHERAPY ALERT CARD at  check-in to the Emergency Department and triage nurse.  Should you have questions after your visit or need to cancel or reschedule your appointment, please contact [place] [facility]  Dept: [phone]  and follow the prompts.  Office hours are 8:00 a.m. to 4:30 p.m. Monday - Friday. Please note that voicemails left after 4:00 p.m. may not be returned until the following business day.  We are closed weekends and major holidays. You have access to a nurse at all times for urgent questions. Please call the main number to the clinic Dept: [phone] and follow the prompts.   For any non-urgent questions, you may also contact your provider using MyChart. We now offer e-Visits for anyone 39 and older to request care online for non-urgent symptoms. For details visit [email or web address].   Also download the MyChart app! Go to the app store, search "MyChart", open the app, select [facility], and log in with your MyChart username and password.  Daratumumab; Hyaluronidase Injection What is this medication? DARATUMUMAB; HYALURONIDASE (dar a toom ue mab; hye al ur ON i dase) treats multiple myeloma, a type of bone marrow cancer. Daratumumab works by blocking a protein that causes cancer cells to grow and multiply. This helps to slow or stop the spread of cancer cells. Hyaluronidase works by increasing the absorption of other medications in the body to help them work better. This medication may also be used treat amyloidosis, a condition that causes the buildup of a protein (amyloid) in your body. It works by reducing the buildup of this protein, which decreases symptoms. It  is a combination medication that contains a monoclonal antibody. This medicine may be used for other purposes; ask your health care provider or pharmacist if you have questions. COMMON BRAND NAME(S): DARZALEX FASPRO What should I tell my care team before I take this medication? They need to know if you  have any of these conditions: Heart disease Infection, such as chickenpox, cold sores, herpes, hepatitis B Lung or breathing disease An unusual or allergic reaction to daratumumab, hyaluronidase, other medications, foods, dyes, or preservatives Pregnant or trying to get pregnant Breast-feeding How should I use this medication? This medication is injected under the skin. It is given by your care team in a hospital or clinic setting. Talk to your care team about the use of this medication in children. Special care may be needed. Overdosage: If you think you have taken too much of this medicine contact a poison control center or emergency room at once. NOTE: This medicine is only for you. Do not share this medicine with others. What if I miss a dose? Keep appointments for follow-up doses. It is important not to miss your dose. Call your care team if you are unable to keep an appointment. What may interact with this medication? Interactions have not been studied. This list may not describe all possible interactions. Give your health care provider a list of all the medicines, herbs, non-prescription drugs, or dietary supplements you use. Also tell them if you smoke, drink alcohol, or use illegal drugs. Some items may interact with your medicine. What should I watch for while using this medication? Your condition will be monitored carefully while you are receiving this medication. This medication can cause serious allergic reactions. To reduce your risk, your care team may give you other medication to take before receiving this one. Be sure to follow the directions from your care team. This medication can affect the results of blood tests to match your blood type. These changes can last for up to 6 months after the final dose. Your care team will do blood tests to match your blood type before you start treatment. Tell all of your care team that you are being treated with this medication before  receiving a blood transfusion. This medication can affect the results of some tests used to determine treatment response; extra tests may be needed to evaluate response. Talk to your care team if you wish to become pregnant or think you are pregnant. This medication can cause serious birth defects if taken during pregnancy and for 3 months after the last dose. A reliable form of contraception is recommended while taking this medication and for 3 months after the last dose. Talk to your care team about effective forms of contraception. Do not breast-feed while taking this medication. What side effects may I notice from receiving this medication? Side effects that you should report to your care team as soon as possible: Allergic reactions--skin rash, itching, hives, swelling of the face, lips, tongue, or throat Heart rhythm changes--fast or irregular heartbeat, dizziness, feeling faint or lightheaded, chest pain, trouble breathing Infection--fever, chills, cough, sore throat, wounds that don't heal, pain or trouble when passing urine, general feeling of discomfort or being unwell Infusion reactions--chest pain, shortness of breath or trouble breathing, feeling faint or lightheaded Sudden eye pain or change in vision such as blurry vision, seeing halos around lights, vision loss Unusual bruising or bleeding Side effects that usually do not require medical attention (report to your care team if they continue or are  bothersome): Constipation Diarrhea Fatigue Nausea Pain, tingling, or numbness in the hands or feet Swelling of the ankles, hands, or feet This list may not describe all possible side effects. Call your doctor for medical advice about side effects. You may report side effects to FDA at 1-800-FDA-1088. Where should I keep my medication? This medication is given in a hospital or clinic. It will not be stored at home. NOTE: This sheet is a summary. It may not cover all possible information.  If you have questions about this medicine, talk to your doctor, pharmacist, or health care provider.  2024 Elsevier/Gold Standard (2021-05-02 00:00:00)

## 2022-09-27 ENCOUNTER — Telehealth: Payer: Self-pay

## 2022-09-27 NOTE — Telephone Encounter (Signed)
-----   Message from Nurse Ashby Dawes sent at 09/26/2022  4:45 PM EDT ----- Regarding: First time/ Darzalex Faspro/ Dr Arbutus Ped pt Pt had first time Darzalex Faspro today, tolerated well.  Thanks!

## 2022-09-27 NOTE — Telephone Encounter (Signed)
LM for patient that this nurse was calling to see how they were doing after their treatment. Please call back to Dr. Rudi Rummage nurse at [phone] if they have any questions or concerns regarding the treatment.

## 2022-09-28 ENCOUNTER — Telehealth: Payer: Self-pay | Admitting: Internal Medicine

## 2022-09-28 NOTE — Telephone Encounter (Signed)
Called patient regarding September and October appointments, left a voicemail.

## 2022-10-04 ENCOUNTER — Inpatient Hospital Stay: Payer: 59 | Admitting: Internal Medicine

## 2022-10-04 ENCOUNTER — Inpatient Hospital Stay: Payer: 59

## 2022-10-04 DIAGNOSIS — D472 Monoclonal gammopathy: Secondary | ICD-10-CM | POA: Diagnosis not present

## 2022-10-04 DIAGNOSIS — C9000 Multiple myeloma not having achieved remission: Secondary | ICD-10-CM

## 2022-10-04 LAB — CBC WITH DIFFERENTIAL (CANCER CENTER ONLY)
Abs Immature Granulocytes: 0.01 10*3/uL (ref 0.00–0.07)
Basophils Absolute: 0 10*3/uL (ref 0.0–0.1)
Basophils Relative: 0 %
Eosinophils Absolute: 0.1 10*3/uL (ref 0.0–0.5)
Eosinophils Relative: 2 %
HCT: 31.4 % — ABNORMAL LOW (ref 36.0–46.0)
Hemoglobin: 9.7 g/dL — ABNORMAL LOW (ref 12.0–15.0)
Immature Granulocytes: 0 %
Lymphocytes Relative: 13 %
Lymphs Abs: 0.5 10*3/uL — ABNORMAL LOW (ref 0.7–4.0)
MCH: 25.4 pg — ABNORMAL LOW (ref 26.0–34.0)
MCHC: 30.9 g/dL (ref 30.0–36.0)
MCV: 82.2 fL (ref 80.0–100.0)
Monocytes Absolute: 0.3 10*3/uL (ref 0.1–1.0)
Monocytes Relative: 8 %
Neutro Abs: 3 10*3/uL (ref 1.7–7.7)
Neutrophils Relative %: 77 %
Platelet Count: 115 10*3/uL — ABNORMAL LOW (ref 150–400)
RBC: 3.82 MIL/uL — ABNORMAL LOW (ref 3.87–5.11)
RDW: 16.3 % — ABNORMAL HIGH (ref 11.5–15.5)
WBC Count: 3.9 10*3/uL — ABNORMAL LOW (ref 4.0–10.5)
nRBC: 0 % (ref 0.0–0.2)

## 2022-10-04 MED ORDER — DIPHENHYDRAMINE HCL 25 MG PO CAPS
50.0000 mg | ORAL_CAPSULE | Freq: Once | ORAL | Status: AC
  Administered 2022-10-04: 50 mg via ORAL
  Filled 2022-10-04: qty 2

## 2022-10-04 MED ORDER — DEXAMETHASONE 4 MG PO TABS
20.0000 mg | ORAL_TABLET | Freq: Once | ORAL | Status: AC
  Administered 2022-10-04: 20 mg via ORAL
  Filled 2022-10-04: qty 5

## 2022-10-04 MED ORDER — ACETAMINOPHEN 325 MG PO TABS
650.0000 mg | ORAL_TABLET | Freq: Once | ORAL | Status: AC
  Administered 2022-10-04: 650 mg via ORAL
  Filled 2022-10-04: qty 2

## 2022-10-04 MED ORDER — MONTELUKAST SODIUM 10 MG PO TABS
10.0000 mg | ORAL_TABLET | Freq: Once | ORAL | Status: AC
  Administered 2022-10-04: 10 mg via ORAL
  Filled 2022-10-04: qty 1

## 2022-10-04 MED ORDER — DARATUMUMAB-HYALURONIDASE-FIHJ 1800-30000 MG-UT/15ML ~~LOC~~ SOLN
1800.0000 mg | Freq: Once | SUBCUTANEOUS | Status: AC
  Administered 2022-10-04: 1800 mg via SUBCUTANEOUS
  Filled 2022-10-04: qty 15

## 2022-10-04 NOTE — Patient Instructions (Signed)
[facility] [address] [facility]  Discharge Instructions: Thank you for choosing [facility] to provide your oncology and hematology care.   If you have a lab appointment with the Cancer Center, please go directly to the Cancer Center and check in at the registration area.   Wear comfortable clothing and clothing appropriate for easy access to any Portacath or PICC line.   We strive to give you quality time with your provider. You may need to reschedule your appointment if you arrive late (15 or more minutes).  Arriving late affects you and other patients whose appointments are after yours.  Also, if you miss three or more appointments without notifying the office, you may be dismissed from the clinic at the provider's discretion.      For prescription refill requests, have your pharmacy contact our office and allow 72 hours for refills to be completed.    Today you received the following chemotherapy and/or immunotherapy agents: Darzalex Faspro      To help prevent nausea and vomiting after your treatment, we encourage you to take your nausea medication as directed.  BELOW ARE SYMPTOMS THAT SHOULD BE REPORTED IMMEDIATELY: *FEVER GREATER THAN 100.4 F (38 C) OR HIGHER *CHILLS OR SWEATING *NAUSEA AND VOMITING THAT IS NOT CONTROLLED WITH YOUR NAUSEA MEDICATION *UNUSUAL SHORTNESS OF BREATH *UNUSUAL BRUISING OR BLEEDING *URINARY PROBLEMS (pain or burning when urinating, or frequent urination) *BOWEL PROBLEMS (unusual diarrhea, constipation, pain near the anus) TENDERNESS IN MOUTH AND THROAT WITH OR WITHOUT PRESENCE OF ULCERS (sore throat, sores in mouth, or a toothache) UNUSUAL RASH, SWELLING OR PAIN  UNUSUAL VAGINAL DISCHARGE OR ITCHING   Items with * indicate a potential emergency and should be followed up as soon as possible or go to the Emergency Department if any problems should occur.  Please show the CHEMOTHERAPY ALERT CARD or IMMUNOTHERAPY ALERT CARD at  check-in to the Emergency Department and triage nurse.  Should you have questions after your visit or need to cancel or reschedule your appointment, please contact [facility]  Dept: [phone]  and follow the prompts.  Office hours are 8:00 a.m. to 4:30 p.m. Monday - Friday. Please note that voicemails left after 4:00 p.m. may not be returned until the following business day.  We are closed weekends and major holidays. You have access to a nurse at all times for urgent questions. Please call the main number to the clinic Dept: [phone] and follow the prompts.   For any non-urgent questions, you may also contact your provider using MyChart. We now offer e-Visits for anyone 39 and older to request care online for non-urgent symptoms. For details visit [email or web address].   Also download the MyChart app! Go to the app store, search "MyChart", open the app, select [place], and log in with your MyChart username and password.  Daratumumab; Hyaluronidase Injection What is this medication? DARATUMUMAB; HYALURONIDASE (dar a toom ue mab; hye al ur ON i dase) treats multiple myeloma, a type of bone marrow cancer. Daratumumab works by blocking a protein that causes cancer cells to grow and multiply. This helps to slow or stop the spread of cancer cells. Hyaluronidase works by increasing the absorption of other medications in the body to help them work better. This medication may also be used treat amyloidosis, a condition that causes the buildup of a protein (amyloid) in your body. It works by reducing the buildup of this protein, which decreases symptoms. It  is a combination medication that contains a monoclonal antibody. This medicine may be used for other purposes; ask your health care provider or pharmacist if you have questions. COMMON BRAND NAME(S): DARZALEX FASPRO What should I tell my care team before I take this medication? They need to know if you  have any of these conditions: Heart disease Infection, such as chickenpox, cold sores, herpes, hepatitis B Lung or breathing disease An unusual or allergic reaction to daratumumab, hyaluronidase, other medications, foods, dyes, or preservatives Pregnant or trying to get pregnant Breast-feeding How should I use this medication? This medication is injected under the skin. It is given by your care team in a hospital or clinic setting. Talk to your care team about the use of this medication in children. Special care may be needed. Overdosage: If you think you have taken too much of this medicine contact a poison control center or emergency room at once. NOTE: This medicine is only for you. Do not share this medicine with others. What if I miss a dose? Keep appointments for follow-up doses. It is important not to miss your dose. Call your care team if you are unable to keep an appointment. What may interact with this medication? Interactions have not been studied. This list may not describe all possible interactions. Give your health care provider a list of all the medicines, herbs, non-prescription drugs, or dietary supplements you use. Also tell them if you smoke, drink alcohol, or use illegal drugs. Some items may interact with your medicine. What should I watch for while using this medication? Your condition will be monitored carefully while you are receiving this medication. This medication can cause serious allergic reactions. To reduce your risk, your care team may give you other medication to take before receiving this one. Be sure to follow the directions from your care team. This medication can affect the results of blood tests to match your blood type. These changes can last for up to 6 months after the final dose. Your care team will do blood tests to match your blood type before you start treatment. Tell all of your care team that you are being treated with this medication before  receiving a blood transfusion. This medication can affect the results of some tests used to determine treatment response; extra tests may be needed to evaluate response. Talk to your care team if you wish to become pregnant or think you are pregnant. This medication can cause serious birth defects if taken during pregnancy and for 3 months after the last dose. A reliable form of contraception is recommended while taking this medication and for 3 months after the last dose. Talk to your care team about effective forms of contraception. Do not breast-feed while taking this medication. What side effects may I notice from receiving this medication? Side effects that you should report to your care team as soon as possible: Allergic reactions--skin rash, itching, hives, swelling of the face, lips, tongue, or throat Heart rhythm changes--fast or irregular heartbeat, dizziness, feeling faint or lightheaded, chest pain, trouble breathing Infection--fever, chills, cough, sore throat, wounds that don't heal, pain or trouble when passing urine, general feeling of discomfort or being unwell Infusion reactions--chest pain, shortness of breath or trouble breathing, feeling faint or lightheaded Sudden eye pain or change in vision such as blurry vision, seeing halos around lights, vision loss Unusual bruising or bleeding Side effects that usually do not require medical attention (report to your care team if they continue or are  bothersome): Constipation Diarrhea Fatigue Nausea Pain, tingling, or numbness in the hands or feet Swelling of the ankles, hands, or feet This list may not describe all possible side effects. Call your doctor for medical advice about side effects. You may report side effects to FDA at 1-800-FDA-1088. Where should I keep my medication? This medication is given in a hospital or clinic. It will not be stored at home. NOTE: This sheet is a summary. It may not cover all possible information.  If you have questions about this medicine, talk to your doctor, pharmacist, or health care provider.  2024 Elsevier/Gold Standard (2021-05-02 00:00:00)

## 2022-10-04 NOTE — Progress Notes (Signed)
Patient tolerated Darzalex fastpro with no issues. She stayed 1 hour post injecction with no issues.

## 2022-10-04 NOTE — Progress Notes (Signed)
[facility] [phone]   [phone]  OFFICE PROGRESS NOTE  [name], MD [address] [place] [address]  DIAGNOSIS: Plasma cell dyscrasia initially diagnosed as MGUS in September 2010, with additional symptoms suggestive of POEMS syndrome.   PRIOR THERAPY:  1) Velcade 1.3 MG/M2 subcutaneously with Decadron 40 mg by mouth on a weekly basis. First cycle 11/24/2013. She status post 31 weekly doses of treatment. 2) Velcade 1.3 MG/M2 subcutaneously and weekly basis with Decadron 40 mg by mouth weekly. First dose 02/01/2015. Status post 28 cycles. 3) Revlimid 25 mg by mouth daily for 21 days every 4 weeks with weekly Decadron 20 mg. started in 11/27/2015. Status post 3 cycles discontinued secondary to lack of response. 4) Systemic treatment with Velcade 1.3 MG/KG weekly, Revlimid 25 mg by mouth daily for 21 days every 4 weeks in addition to Decadron 20 mg by mouth weekly. First dose 03/06/2016. Status post 42 cycles.  She has a break off treatment from June 2021 until July 2022.  CURRENT THERAPY: Second line treatment with daratumumab, Pomalyst 3 mg for 21 days every 4 weeks as well as Decadron 20 mg p.o. weekly.  First dose 09/26/2022.  Status post day 1 of cycle #1  INTERVAL HISTORY: Brenda Conner 62 y.o. female returns to the clinic today for follow-up visit.Discussed the use of AI scribe software for clinical note transcription with the patient, who gave verbal consent to proceed.  History of Present Illness   Brenda Conner, a 62 year old patient with a history of MGUS diagnosed in 2010, which has since progressed to multiple myeloma, started a new treatment regimen last week. The regimen includes daratumumab, Pomalyst, and Decadron. The patient tolerated the first week of treatment fairly well, with the most significant complaint being insomnia due to Decadron. The insomnia lasted for about four days post-treatment, leaving the  patient feeling exhausted.  The patient also reported experiencing lower back pain, which resolved after a few days. It was unclear whether this was related to the treatment or not. The patient denied any significant disease activity on a recent PET scan.  In terms of other side effects, the patient reported occasional nausea when feeling particularly tired, which was managed effectively with Zofran. The patient denied any headaches, changes in vision, or significant breathing issues. A mild cough and shortness of breath were noted, but the patient attributed these to concurrent seasonal allergies.        MEDICAL HISTORY: Past Medical History:  Diagnosis Date   Acid reflux    Anemia    Anxiety    Asthma    Cancer (HCC)    waldenstroms/ macroglobinulemia   Depression    Depression    Diabetes mellitus without complication (HCC)    Dysuria 02/28/2016   Gallstones    Heart palpitations    Hypercholesteremia    Hypertension    Hypothyroidism    Macroglobulinemia (HCC)    ? POEMS syndrome   Monoclonal gammopathy of unknown significance (MGUS)    Multiple myeloma (HCC)    Obesity    POEMS syndrome    PONV (postoperative nausea and vomiting)    Sleep apnea    CPAP at bedtime    ALLERGIES:  is allergic to codeine, hydrocodone, levothyroxine sodium, lortab [hydrocodone-acetaminophen], onion, shellfish allergy, and amoxicillin.  MEDICATIONS:  Current Outpatient Medications  Medication Sig Dispense Refill   ACCU-CHEK AVIVA PLUS test strip USE TO CHECK SUGAR TWICE  A DAY 90     acyclovir (ZOVIRAX) 400 MG tablet Take 1 tablet (400 mg total) by mouth 2 (two) times daily. 60 tablet 11   ALPRAZolam (XANAX) 0.5 MG tablet Take 1 tablet (0.5 mg total) by mouth at bedtime as needed for anxiety. 30 tablet 1   aspirin 81 MG chewable tablet Chew by mouth daily.     Azelastine HCl 0.15 % SOLN as needed.     B-D UF III MINI PEN NEEDLES 31G X 5 MM MISC      Blood Glucose Monitoring Suppl (ONE  TOUCH ULTRA MINI) W/DEVICE KIT See admin instructions. Reported on 01/25/2015  0   cetirizine (ZYRTEC ALLERGY) 10 MG tablet Take 1 tablet by mouth daily.     dexamethasone (DECADRON) 4 MG tablet Take 5 tablets (20 mg total) by mouth once a week. Take the day after darzalex faspro. Take with breakfast. 20 tablet 11   DiphenhydrAMINE HCl (BENADRYL ALLERGY PO) Take 1 Dose by mouth daily at 6 (six) AM.     fexofenadine (ALLEGRA ALLERGY) 180 MG tablet Take 1 tablet by mouth daily.     Fluticasone-Salmeterol (ADVAIR) 100-50 MCG/DOSE AEPB Inhale 2 puffs into the lungs every 12 (twelve) hours.     glucosamine-chondroitin 500-400 MG tablet Take 1 tablet by mouth daily.     ibuprofen (ADVIL,MOTRIN) 100 MG tablet Take 100 mg by mouth every 6 (six) hours as needed. Reported on 05/31/2015     Insulin Glargine (BASAGLAR KWIKPEN) 100 UNIT/ML Inject 20 Units into the skin at bedtime.     levothyroxine (SYNTHROID) 137 MCG tablet Take 137 mcg by mouth daily.     lisinopril (PRINIVIL,ZESTRIL) 10 MG tablet Take 10 mg by mouth daily. auth number 05/29/2018 [id number]  3   loperamide (IMODIUM) 2 MG capsule Take 2 mg by mouth as needed for diarrhea or loose stools.     magic mouthwash w/lidocaine SOLN Take 5 mLs by mouth 4 (four) times daily as needed for mouth pain. Swish, Gargle, and spit 240 mL 1   Melatonin 5 MG CAPS Take 1 capsule by mouth at bedtime.     metFORMIN (GLUCOPHAGE-XR) 500 MG 24 hr tablet Take 500 mg by mouth daily.     NOVOLOG FLEXPEN 100 UNIT/ML FlexPen Inject 15 Units into the skin in the morning, at noon, and at bedtime. Per Sliding Scale     omeprazole (PRILOSEC) 20 MG capsule Take 20 mg by mouth 2 (two) times daily.     ondansetron (ZOFRAN) 8 MG tablet Take 1 tablet (8 mg total) by mouth every 8 (eight) hours as needed for nausea or vomiting. 30 tablet 1   pomalidomide (POMALYST) 3 MG capsule Take 1 capsule (3 mg total) by mouth daily. For 21 days and off for 7 days.[name] # [id number]     Date  Obtained 09/19/22 Adult female not of childbearing potential 21 capsule 0   pravastatin (PRAVACHOL) 80 MG tablet Take 80 mg by mouth daily.     PROAIR HFA 108 (90 Base) MCG/ACT inhaler Reported on 06/21/2015  1   prochlorperazine (COMPAZINE) 10 MG tablet Take 1 tablet (10 mg total) by mouth every 6 (six) hours as needed for nausea or vomiting. 30 tablet 1   sertraline (ZOLOFT) 50 MG tablet TAKE 3 TABLETS BY MOUTH EVERY DAY 270 tablet 1   verapamil (CALAN-SR) 240 MG CR tablet Take 240 mg by mouth 2 (two) times daily.     No current facility-administered medications for this visit.  SURGICAL HISTORY:  Past Surgical History:  Procedure Laterality Date   ABLATION  09/09/2007   HTA and polyp resection   BONE MARROW BIOPSY  04/08/2012   BONE MARROW BIOPSY  01/2020   CATARACT EXTRACTION, BILATERAL Bilateral 02/2022   FOOT SURGERY Bilateral 01/09/1996   small toe   KNEE ARTHROSCOPY W/ MENISCAL REPAIR Bilateral 10/10 ; 3/11   LAPAROSCOPIC CHOLECYSTECTOMY  01/09/1995   LUMBAR DISC SURGERY  03/08/2004   herniation, L4-L5   NASAL SINUS SURGERY  01/08/2002   UTERINE FIBROID EMBOLIZATION  2009   HTA and polyp resection    REVIEW OF SYSTEMS:  A comprehensive review of systems was negative except for: Constitutional: positive for fatigue Respiratory: positive for dyspnea on exertion Behavioral/Psych: positive for sleep disturbance   PHYSICAL EXAMINATION: General appearance: alert, cooperative, fatigued, and no distress Head: Normocephalic, without obvious abnormality, atraumatic Neck: no adenopathy Lymph nodes: Cervical, supraclavicular, and axillary nodes normal. Resp: clear to auscultation bilaterally Back: symmetric, no curvature. ROM normal. No CVA tenderness. Cardio: regular rate and rhythm, S1, S2 normal, no murmur, click, rub or gallop GI: soft, non-tender; bowel sounds normal; no masses,  no organomegaly Extremities: extremities normal, atraumatic, no cyanosis or edema  ECOG  PERFORMANCE STATUS: 1 - Symptomatic but completely ambulatory  Blood pressure (!) 134/56, pulse 88, temperature 97.9 F (36.6 C), temperature source Oral, resp. rate 18, weight 269 lb 4.8 oz (122.2 kg), SpO2 98%.  LABORATORY DATA: Lab Results  Component Value Date   WBC 3.9 (L) 10/04/2022   HGB 9.7 (L) 10/04/2022   HCT 31.4 (L) 10/04/2022   MCV 82.2 10/04/2022   PLT 115 (L) 10/04/2022      Chemistry      Component Value Date/Time   NA 135 09/19/2022 1021   NA 138 11/14/2016 0824   K 4.0 09/19/2022 1021   K 4.3 11/14/2016 0824   CL 103 09/19/2022 1021   CL 104 04/07/2012 1415   CO2 25 09/19/2022 1021   CO2 23 11/14/2016 0824   BUN 12 09/19/2022 1021   BUN 9.1 11/14/2016 0824   CREATININE 0.70 09/19/2022 1021   CREATININE 0.7 11/14/2016 0824      Component Value Date/Time   CALCIUM 9.2 09/19/2022 1021   CALCIUM 9.6 11/14/2016 0824   ALKPHOS 118 09/19/2022 1021   ALKPHOS 94 11/14/2016 0824   AST 36 09/19/2022 1021   AST 28 11/14/2016 0824   ALT 21 09/19/2022 1021   ALT 24 11/14/2016 0824   BILITOT 0.5 09/19/2022 1021   BILITOT 0.41 11/14/2016 0824      ASSESSMENT AND PLAN:  This is a very pleasant 62 years old white female with multiple myeloma with questionable POEMS syndrome symptom.  The patient has been on treatment with subcutaneous weekly Velcade as well as Revlimid and Decadron status post 34 cycles.  She has been tolerating this treatment well. The patient has been on observation for the last several months and she has been doing fine with no concerning issues except for the recent upper respiratory infection and viral gastroenteritis. Her myeloma panel showed continuous increase in her free lambda light chain. The patient had a bone marrow biopsy and aspirate recently that showed 3-5% plasma cells still suspicious for plasma cell dyscrasia.  The skeletal bone survey was negative for any lytic lesions. The patient has been off treatment for more than a year  between June 2021 until July 2022. She had repeat myeloma panel that showed significant worsening and increase of her lambda  light chain.  She continues to have anemia but no renal insufficiency or hypercalcemia. She resumed her treatment with Velcade, Revlimid and Decadron on July 19, 2020.   The patient is currently on observation and she is feeling fine with no concerning complaints. She was seen recently by Dr. Marissa Calamity at [facility] for second opinion and after extensive investigation is recommended for her to resume her treatment for the multiple myeloma with daratumumab, Pomalyst 3 Mg p.o. daily for 21 days every 4 weeks and Decadron 20 mg weekly. Multiple Myeloma Patient is on day 8 of cycle 1 of treatment with Daratumumab, Pomalyst, and Decadron. Tolerating treatment well overall, with primary complaint of insomnia due to Decadron. No significant nausea, vomiting, headaches, or vision changes. Mild cough and shortness of breath, likely due to allergies. -Continue current treatment regimen. -Consider reducing Decadron to 20mg  on the day of Daratumumab injection only if insomnia persists. -Continue Zofran as needed for nausea. -Return in two weeks to start cycle 2 of treatment.  General Health Maintenance Patient inquired about flu vaccine. -Advise patient to receive annual flu vaccine and COVID vaccine as appropriate.     For the iron deficiency anemia, I will give her a refill of Integra +1 capsule p.o. daily. She was advised to call immediately if she has any concerning symptoms in the interval.  All questions were answered. The patient knows to call the clinic with any problems, questions or concerns. We can certainly see the patient much sooner if necessary.  Disclaimer: This note was dictated with voice recognition software. Similar sounding words can inadvertently be transcribed and may not be corrected upon review.

## 2022-10-05 LAB — BETA 2 MICROGLOBULIN, SERUM: Beta-2 Microglobulin: 2.3 mg/L (ref 0.6–2.4)

## 2022-10-09 LAB — KAPPA/LAMBDA LIGHT CHAINS
Kappa free light chain: 10.1 mg/L (ref 3.3–19.4)
Kappa, lambda light chain ratio: 0.01 — ABNORMAL LOW (ref 0.26–1.65)
Lambda free light chains: 1121.5 mg/L — ABNORMAL HIGH (ref 5.7–26.3)

## 2022-10-10 ENCOUNTER — Inpatient Hospital Stay: Payer: 59

## 2022-10-10 ENCOUNTER — Inpatient Hospital Stay: Payer: 59 | Attending: Internal Medicine

## 2022-10-10 VITALS — BP 132/52 | HR 66 | Temp 98.7°F | Resp 18 | Wt 269.8 lb

## 2022-10-10 DIAGNOSIS — D649 Anemia, unspecified: Secondary | ICD-10-CM | POA: Insufficient documentation

## 2022-10-10 DIAGNOSIS — Z9049 Acquired absence of other specified parts of digestive tract: Secondary | ICD-10-CM | POA: Insufficient documentation

## 2022-10-10 DIAGNOSIS — K7460 Unspecified cirrhosis of liver: Secondary | ICD-10-CM | POA: Insufficient documentation

## 2022-10-10 DIAGNOSIS — Z5112 Encounter for antineoplastic immunotherapy: Secondary | ICD-10-CM | POA: Diagnosis present

## 2022-10-10 DIAGNOSIS — C9000 Multiple myeloma not having achieved remission: Secondary | ICD-10-CM | POA: Insufficient documentation

## 2022-10-10 DIAGNOSIS — Z885 Allergy status to narcotic agent status: Secondary | ICD-10-CM | POA: Insufficient documentation

## 2022-10-10 DIAGNOSIS — D472 Monoclonal gammopathy: Secondary | ICD-10-CM | POA: Diagnosis present

## 2022-10-10 DIAGNOSIS — R5383 Other fatigue: Secondary | ICD-10-CM | POA: Insufficient documentation

## 2022-10-10 DIAGNOSIS — Z79899 Other long term (current) drug therapy: Secondary | ICD-10-CM | POA: Insufficient documentation

## 2022-10-10 DIAGNOSIS — Z79620 Long term (current) use of immunosuppressive biologic: Secondary | ICD-10-CM | POA: Insufficient documentation

## 2022-10-10 DIAGNOSIS — Z7961 Long term (current) use of immunomodulator: Secondary | ICD-10-CM | POA: Insufficient documentation

## 2022-10-10 DIAGNOSIS — Z880 Allergy status to penicillin: Secondary | ICD-10-CM | POA: Insufficient documentation

## 2022-10-10 DIAGNOSIS — Z7969 Long term (current) use of other immunomodulators and immunosuppressants: Secondary | ICD-10-CM | POA: Insufficient documentation

## 2022-10-10 DIAGNOSIS — Z7952 Long term (current) use of systemic steroids: Secondary | ICD-10-CM | POA: Insufficient documentation

## 2022-10-10 DIAGNOSIS — Z7951 Long term (current) use of inhaled steroids: Secondary | ICD-10-CM | POA: Diagnosis not present

## 2022-10-10 LAB — CBC WITH DIFFERENTIAL (CANCER CENTER ONLY)
Abs Immature Granulocytes: 0.01 10*3/uL (ref 0.00–0.07)
Basophils Absolute: 0 10*3/uL (ref 0.0–0.1)
Basophils Relative: 1 %
Eosinophils Absolute: 0.1 10*3/uL (ref 0.0–0.5)
Eosinophils Relative: 4 %
HCT: 29.9 % — ABNORMAL LOW (ref 36.0–46.0)
Hemoglobin: 9.3 g/dL — ABNORMAL LOW (ref 12.0–15.0)
Immature Granulocytes: 0 %
Lymphocytes Relative: 19 %
Lymphs Abs: 0.5 10*3/uL — ABNORMAL LOW (ref 0.7–4.0)
MCH: 25.5 pg — ABNORMAL LOW (ref 26.0–34.0)
MCHC: 31.1 g/dL (ref 30.0–36.0)
MCV: 82.1 fL (ref 80.0–100.0)
Monocytes Absolute: 0.3 10*3/uL (ref 0.1–1.0)
Monocytes Relative: 12 %
Neutro Abs: 1.7 10*3/uL (ref 1.7–7.7)
Neutrophils Relative %: 64 %
Platelet Count: 83 10*3/uL — ABNORMAL LOW (ref 150–400)
RBC: 3.64 MIL/uL — ABNORMAL LOW (ref 3.87–5.11)
RDW: 16.2 % — ABNORMAL HIGH (ref 11.5–15.5)
WBC Count: 2.7 10*3/uL — ABNORMAL LOW (ref 4.0–10.5)
nRBC: 0 % (ref 0.0–0.2)

## 2022-10-10 MED ORDER — ACETAMINOPHEN 325 MG PO TABS
650.0000 mg | ORAL_TABLET | Freq: Once | ORAL | Status: AC
  Administered 2022-10-10: 650 mg via ORAL
  Filled 2022-10-10: qty 2

## 2022-10-10 MED ORDER — DEXAMETHASONE 4 MG PO TABS
20.0000 mg | ORAL_TABLET | Freq: Once | ORAL | Status: AC
  Administered 2022-10-10: 20 mg via ORAL
  Filled 2022-10-10: qty 5

## 2022-10-10 MED ORDER — DARATUMUMAB-HYALURONIDASE-FIHJ 1800-30000 MG-UT/15ML ~~LOC~~ SOLN
1800.0000 mg | Freq: Once | SUBCUTANEOUS | Status: AC
  Administered 2022-10-10: 1800 mg via SUBCUTANEOUS
  Filled 2022-10-10: qty 15

## 2022-10-10 MED ORDER — MONTELUKAST SODIUM 10 MG PO TABS
10.0000 mg | ORAL_TABLET | Freq: Once | ORAL | Status: AC
  Administered 2022-10-10: 10 mg via ORAL
  Filled 2022-10-10: qty 1

## 2022-10-10 MED ORDER — DIPHENHYDRAMINE HCL 25 MG PO CAPS
50.0000 mg | ORAL_CAPSULE | Freq: Once | ORAL | Status: AC
  Administered 2022-10-10: 50 mg via ORAL
  Filled 2022-10-10: qty 2

## 2022-10-10 NOTE — Progress Notes (Signed)
Per Dr.Mohamed ok to treat with plt of 83

## 2022-10-10 NOTE — Patient Instructions (Signed)
[facility]  Discharge Instructions: Thank you for choosing [facility] to provide your oncology and hematology care.   If you have a lab appointment with the Cancer Center, please go directly to the Cancer Center and check in at the registration area.   Wear comfortable clothing and clothing appropriate for easy access to any Portacath or PICC line.   We strive to give you quality time with your provider. You may need to reschedule your appointment if you arrive late (15 or more minutes).  Arriving late affects you and other patients whose appointments are after yours.  Also, if you miss three or more appointments without notifying the office, you may be dismissed from the clinic at the provider's discretion.      For prescription refill requests, have your pharmacy contact our office and allow 72 hours for refills to be completed.    Today you received the following chemotherapy and/or immunotherapy agents: Darzalex Faspro      To help prevent nausea and vomiting after your treatment, we encourage you to take your nausea medication as directed.  BELOW ARE SYMPTOMS THAT SHOULD BE REPORTED IMMEDIATELY: *FEVER GREATER THAN 100.4 F (38 C) OR HIGHER *CHILLS OR SWEATING *NAUSEA AND VOMITING THAT IS NOT CONTROLLED WITH YOUR NAUSEA MEDICATION *UNUSUAL SHORTNESS OF BREATH *UNUSUAL BRUISING OR BLEEDING *URINARY PROBLEMS (pain or burning when urinating, or frequent urination) *BOWEL PROBLEMS (unusual diarrhea, constipation, pain near the anus) TENDERNESS IN MOUTH AND THROAT WITH OR WITHOUT PRESENCE OF ULCERS (sore throat, sores in mouth, or a toothache) UNUSUAL RASH, SWELLING OR PAIN  UNUSUAL VAGINAL DISCHARGE OR ITCHING   Items with * indicate a potential emergency and should be followed up as soon as possible or go to the Emergency Department if any problems should occur.  Please show the CHEMOTHERAPY ALERT CARD or IMMUNOTHERAPY ALERT CARD at  check-in to the Emergency Department and triage nurse.  Should you have questions after your visit or need to cancel or reschedule your appointment, please contact [facility]  Dept: [phone]  and follow the prompts.  Office hours are 8:00 a.m. to 4:30 p.m. Monday - Friday. Please note that voicemails left after 4:00 p.m. may not be returned until the following business day.  We are closed weekends and major holidays. You have access to a nurse at all times for urgent questions. Please call the main number to the clinic Dept: [phone] and follow the prompts.   For any non-urgent questions, you may also contact your provider using MyChart. We now offer e-Visits for anyone 18 and older to request care online for non-urgent symptoms. For details visit [email or web address].   Also download the MyChart app! Go to the app store, search "MyChart", open the app, select [facility], and log in with your MyChart username and password.  Daratumumab; Hyaluronidase Injection What is this medication? DARATUMUMAB; HYALURONIDASE (dar a toom ue mab; hye al ur ON i dase) treats multiple myeloma, a type of bone marrow cancer. Daratumumab works by blocking a protein that causes cancer cells to grow and multiply. This helps to slow or stop the spread of cancer cells. Hyaluronidase works by increasing the absorption of other medications in the body to help them work better. This medication may also be used treat amyloidosis, a condition that causes the buildup of a protein (amyloid) in your body. It works by reducing the buildup of this protein, which decreases symptoms. It   is a combination medication that contains a monoclonal antibody. This medicine may be used for other purposes; ask your health care provider or pharmacist if you have questions. COMMON BRAND NAME(S): DARZALEX FASPRO What should I tell my care team before I take this medication? They need to know if you  have any of these conditions: Heart disease Infection, such as chickenpox, cold sores, herpes, hepatitis B Lung or breathing disease An unusual or allergic reaction to daratumumab, hyaluronidase, other medications, foods, dyes, or preservatives Pregnant or trying to get pregnant Breast-feeding How should I use this medication? This medication is injected under the skin. It is given by your care team in a hospital or clinic setting. Talk to your care team about the use of this medication in children. Special care may be needed. Overdosage: If you think you have taken too much of this medicine contact a poison control center or emergency room at once. NOTE: This medicine is only for you. Do not share this medicine with others. What if I miss a dose? Keep appointments for follow-up doses. It is important not to miss your dose. Call your care team if you are unable to keep an appointment. What may interact with this medication? Interactions have not been studied. This list may not describe all possible interactions. Give your health care provider a list of all the medicines, herbs, non-prescription drugs, or dietary supplements you use. Also tell them if you smoke, drink alcohol, or use illegal drugs. Some items may interact with your medicine. What should I watch for while using this medication? Your condition will be monitored carefully while you are receiving this medication. This medication can cause serious allergic reactions. To reduce your risk, your care team may give you other medication to take before receiving this one. Be sure to follow the directions from your care team. This medication can affect the results of blood tests to match your blood type. These changes can last for up to 6 months after the final dose. Your care team will do blood tests to match your blood type before you start treatment. Tell all of your care team that you are being treated with this medication before  receiving a blood transfusion. This medication can affect the results of some tests used to determine treatment response; extra tests may be needed to evaluate response. Talk to your care team if you wish to become pregnant or think you are pregnant. This medication can cause serious birth defects if taken during pregnancy and for 3 months after the last dose. A reliable form of contraception is recommended while taking this medication and for 3 months after the last dose. Talk to your care team about effective forms of contraception. Do not breast-feed while taking this medication. What side effects may I notice from receiving this medication? Side effects that you should report to your care team as soon as possible: Allergic reactions--skin rash, itching, hives, swelling of the face, lips, tongue, or throat Heart rhythm changes--fast or irregular heartbeat, dizziness, feeling faint or lightheaded, chest pain, trouble breathing Infection--fever, chills, cough, sore throat, wounds that don't heal, pain or trouble when passing urine, general feeling of discomfort or being unwell Infusion reactions--chest pain, shortness of breath or trouble breathing, feeling faint or lightheaded Sudden eye pain or change in vision such as blurry vision, seeing halos around lights, vision loss Unusual bruising or bleeding Side effects that usually do not require medical attention (report to your care team if they continue or are   bothersome): Constipation Diarrhea Fatigue Nausea Pain, tingling, or numbness in the hands or feet Swelling of the ankles, hands, or feet This list may not describe all possible side effects. Call your doctor for medical advice about side effects. You may report side effects to FDA at 1-800-FDA-1088. Where should I keep my medication? This medication is given in a hospital or clinic. It will not be stored at home. NOTE: This sheet is a summary. It may not cover all possible information.  If you have questions about this medicine, talk to your doctor, pharmacist, or health care provider.  2024 Elsevier/Gold Standard (2021-05-02 00:00:00)    

## 2022-10-11 ENCOUNTER — Other Ambulatory Visit: Payer: Self-pay | Admitting: Medical Oncology

## 2022-10-11 DIAGNOSIS — C9000 Multiple myeloma not having achieved remission: Secondary | ICD-10-CM

## 2022-10-11 MED ORDER — POMALIDOMIDE 3 MG PO CAPS: 3.0000 mg | ORAL_CAPSULE | Freq: Every day | ORAL | 0 refills | Status: DC

## 2022-10-11 MED ORDER — POMALIDOMIDE 3 MG PO CAPS
3.0000 mg | ORAL_CAPSULE | Freq: Every day | ORAL | 0 refills | Status: DC
Start: 2022-10-11 — End: 2022-10-11

## 2022-10-11 NOTE — Addendum Note (Signed)
Addended by: Charma Igo on: 10/11/2022 03:34 PM   Modules accepted: Orders

## 2022-10-15 NOTE — Progress Notes (Unsigned)
[facility] OFFICE PROGRESS NOTE  [name], MD [address] [place] [address]  DIAGNOSIS: Plasma cell dyscrasia initially diagnosed as MGUS in September 2010.  PRIOR THERAPY: 1) Velcade 1.3 MG/M2 subcutaneously with Decadron 40 mg by mouth on a weekly basis. First cycle 11/24/2013. She status post 31 weekly doses of treatment. 2) Velcade 1.3 MG/M2 subcutaneously and weekly basis with Decadron 40 mg by mouth weekly. First dose 02/01/2015. Status post 28 cycles. 3) Revlimid 25 mg by mouth daily for 21 days every 4 weeks with weekly Decadron 20 mg. started in 11/27/2015. Status post 3 cycles discontinued secondary to lack of response. 4) Systemic treatment with Velcade 1.3 MG/KG weekly, Revlimid 25 mg by mouth daily for 21 days every 4 weeks in addition to Decadron 20 mg by mouth weekly. First dose 03/06/2016. Status post 42 cycles.  She has a break off treatment from June 2021 until July 2022.  CURRENT THERAPY: Second line treatment with daratumumab, Pomalyst 3 mg for 21 days every 4 weeks as well as Decadron 20 mg p.o. weekly.  First dose 09/26/2022.  Status post day 15 cycle #1  INTERVAL HISTORY: Brenda Conner 62 y.o. female returns to the clnic for a follow up visit. She was seen by Dr. Arbutus Ped on 09/19/22. At that point in time, she had repeat labs that showed concern for progression. Dr. Marissa Calamity for second opinion at [facility] center.  He recommended for the patient to start second line treatment with daratumumab Pomalyst and Decadron.    She started treatment on 09/26/26. She saw Dr. Arbutus Ped on 10/04/22. She tolerated treatment fairly well except for fatigue. There was no significant disease on her PET.   She denies any fever, chills, night sweats, or unexplained weight loss. Denies any signs or symptoms of infection including sore throat, nasal congestion, cough, shortness of breath, or skin infections at this time.  She mentions that she had dental  work performed yesterday and took clindamycin.  He was supposed to have a COVID and flu vaccine next week Monday.  Denies any changes in her baseline bowel habits. Denies any nausea, vomiting. Denies any abnormal bleeding or bruising. She is here for evaluation and repeat blood work before undergoing day 22 cycle #1.     MEDICAL HISTORY: Past Medical History:  Diagnosis Date   Acid reflux    Anemia    Anxiety    Asthma    Cancer (HCC)    waldenstroms/ macroglobinulemia   Depression    Depression    Diabetes mellitus without complication (HCC)    Dysuria 02/28/2016   Gallstones    Heart palpitations    Hypercholesteremia    Hypertension    Hypothyroidism    Macroglobulinemia (HCC)    ? POEMS syndrome   Monoclonal gammopathy of unknown significance (MGUS)    Multiple myeloma (HCC)    Obesity    POEMS syndrome    PONV (postoperative nausea and vomiting)    Sleep apnea    CPAP at bedtime    ALLERGIES:  is allergic to codeine, hydrocodone, levothyroxine sodium, lortab [hydrocodone-acetaminophen], onion, shellfish allergy, and amoxicillin.  MEDICATIONS:  Current Outpatient Medications  Medication Sig Dispense Refill   ACCU-CHEK AVIVA PLUS test strip USE TO CHECK SUGAR TWICE A DAY 90     acyclovir (ZOVIRAX) 400 MG tablet Take 1 tablet (400 mg total) by mouth 2 (two) times daily. 60 tablet 11   ALPRAZolam (XANAX) 0.5 MG tablet Take  1 tablet (0.5 mg total) by mouth at bedtime as needed for anxiety. 30 tablet 1   aspirin 81 MG chewable tablet Chew by mouth daily.     Azelastine HCl 0.15 % SOLN as needed.     B-D UF III MINI PEN NEEDLES 31G X 5 MM MISC      Blood Glucose Monitoring Suppl (ONE TOUCH ULTRA MINI) W/DEVICE KIT See admin instructions. Reported on 01/25/2015  0   cetirizine (ZYRTEC ALLERGY) 10 MG tablet Take 1 tablet by mouth daily.     dexamethasone (DECADRON) 4 MG tablet Take 5 tablets (20 mg total) by mouth once a week. Take the day after darzalex faspro. Take with  breakfast. 20 tablet 11   DiphenhydrAMINE HCl (BENADRYL ALLERGY PO) Take 1 Dose by mouth daily at 6 (six) AM.     fexofenadine (ALLEGRA ALLERGY) 180 MG tablet Take 1 tablet by mouth daily.     Fluticasone-Salmeterol (ADVAIR) 100-50 MCG/DOSE AEPB Inhale 2 puffs into the lungs every 12 (twelve) hours.     glucosamine-chondroitin 500-400 MG tablet Take 1 tablet by mouth daily.     ibuprofen (ADVIL,MOTRIN) 100 MG tablet Take 100 mg by mouth every 6 (six) hours as needed. Reported on 05/31/2015     Insulin Glargine (BASAGLAR KWIKPEN) 100 UNIT/ML Inject 20 Units into the skin at bedtime.     levothyroxine (SYNTHROID) 137 MCG tablet Take 137 mcg by mouth daily.     lisinopril (PRINIVIL,ZESTRIL) 10 MG tablet Take 10 mg by mouth daily. auth number 05/29/2018 [id number]  3   loperamide (IMODIUM) 2 MG capsule Take 2 mg by mouth as needed for diarrhea or loose stools.     magic mouthwash w/lidocaine SOLN Take 5 mLs by mouth 4 (four) times daily as needed for mouth pain. Swish, Gargle, and spit 240 mL 1   Melatonin 5 MG CAPS Take 1 capsule by mouth at bedtime.     metFORMIN (GLUCOPHAGE-XR) 500 MG 24 hr tablet Take 500 mg by mouth daily.     NOVOLOG FLEXPEN 100 UNIT/ML FlexPen Inject 15 Units into the skin in the morning, at noon, and at bedtime. Per Sliding Scale     omeprazole (PRILOSEC) 20 MG capsule Take 20 mg by mouth 2 (two) times daily.     ondansetron (ZOFRAN) 8 MG tablet Take 1 tablet (8 mg total) by mouth every 8 (eight) hours as needed for nausea or vomiting. 30 tablet 1   pomalidomide (POMALYST) 3 MG capsule Take 1 capsule (3 mg total) by mouth daily. for 21 days and off for 7 days.[name] #  [id number]    Date Obtained 10/11/2022 Adult female not of childbearing potential 21 capsule 0   pravastatin (PRAVACHOL) 80 MG tablet Take 80 mg by mouth daily.     PROAIR HFA 108 (90 Base) MCG/ACT inhaler Reported on 06/21/2015  1   prochlorperazine (COMPAZINE) 10 MG tablet Take 1 tablet (10 mg total) by mouth  every 6 (six) hours as needed for nausea or vomiting. 30 tablet 1   sertraline (ZOLOFT) 50 MG tablet TAKE 3 TABLETS BY MOUTH EVERY DAY 270 tablet 1   verapamil (CALAN-SR) 240 MG CR tablet Take 240 mg by mouth 2 (two) times daily.     No current facility-administered medications for this visit.    SURGICAL HISTORY:  Past Surgical History:  Procedure Laterality Date   ABLATION  09/09/2007   HTA and polyp resection   BONE MARROW BIOPSY  04/08/2012   BONE MARROW BIOPSY  01/2020   CATARACT EXTRACTION, BILATERAL Bilateral 02/2022   FOOT SURGERY Bilateral 01/09/1996   small toe   KNEE ARTHROSCOPY W/ MENISCAL REPAIR Bilateral 10/10 ; 3/11   LAPAROSCOPIC CHOLECYSTECTOMY  01/09/1995   LUMBAR DISC SURGERY  03/08/2004   herniation, L4-L5   NASAL SINUS SURGERY  01/08/2002   UTERINE FIBROID EMBOLIZATION  2009   HTA and polyp resection    REVIEW OF SYSTEMS:   Review of Systems  Constitutional: Positive for fatigue. Negative for appetite change, chills, fever and unexpected weight change.  HENT: Negative for mouth sores, nosebleeds, sore throat and trouble swallowing.   Eyes: Negative for eye problems and icterus.  Respiratory: Negative for cough, hemoptysis, shortness of breath and wheezing.   Cardiovascular: Negative for chest pain and leg swelling.  Gastrointestinal: Negative for abdominal pain, constipation, diarrhea, nausea and vomiting.  Genitourinary: Negative for bladder incontinence, difficulty urinating, dysuria, frequency and hematuria.   Musculoskeletal: Negative for back pain, gait problem, neck pain and neck stiffness.  Skin: Negative for itching and rash.  Neurological: Negative for dizziness, extremity weakness, gait problem, headaches, light-headedness and seizures.  Hematological: Negative for adenopathy. Does not bruise/bleed easily.  Psychiatric/Behavioral: Negative for confusion, depression and sleep disturbance. The patient is not nervous/anxious.     PHYSICAL  EXAMINATION:  There were no vitals taken for this visit.  ECOG PERFORMANCE STATUS: 1  Physical Exam  Constitutional: Oriented to person, place, and time and well-developed, well-nourished, and in no distress.  HENT:  Head: Normocephalic and atraumatic.  Mouth/Throat: Oropharynx is clear and moist. No oropharyngeal exudate.  Eyes: Conjunctivae are normal. Right eye exhibits no discharge. Left eye exhibits no discharge. No scleral icterus.  Neck: Normal range of motion. Neck supple.  Cardiovascular: Normal rate, regular rhythm, normal heart sounds and intact distal pulses.   Pulmonary/Chest: Effort normal and breath sounds normal. No respiratory distress. No wheezes. No rales.  Abdominal: Soft. Bowel sounds are normal. Exhibits no distension and no mass. There is no tenderness.  Musculoskeletal: Normal range of motion. Exhibits no edema.  Lymphadenopathy:    No cervical adenopathy.  Neurological: Alert and oriented to person, place, and time. Exhibits normal muscle tone. Gait normal. Coordination normal.  Skin: Skin is warm and dry. No rash noted. Not diaphoretic. No erythema. No pallor.  Psychiatric: Mood, memory and judgment normal.  Vitals reviewed.  LABORATORY DATA: Lab Results  Component Value Date   WBC 2.7 (L) 10/10/2022   HGB 9.3 (L) 10/10/2022   HCT 29.9 (L) 10/10/2022   MCV 82.1 10/10/2022   PLT 83 (L) 10/10/2022      Chemistry      Component Value Date/Time   NA 135 09/19/2022 1021   NA 138 11/14/2016 0824   K 4.0 09/19/2022 1021   K 4.3 11/14/2016 0824   CL 103 09/19/2022 1021   CL 104 04/07/2012 1415   CO2 25 09/19/2022 1021   CO2 23 11/14/2016 0824   BUN 12 09/19/2022 1021   BUN 9.1 11/14/2016 0824   CREATININE 0.70 09/19/2022 1021   CREATININE 0.7 11/14/2016 0824      Component Value Date/Time   CALCIUM 9.2 09/19/2022 1021   CALCIUM 9.6 11/14/2016 0824   ALKPHOS 118 09/19/2022 1021   ALKPHOS 94 11/14/2016 0824   AST 36 09/19/2022 1021   AST 28  11/14/2016 0824   ALT 21 09/19/2022 1021   ALT 24 11/14/2016 0824   BILITOT 0.5 09/19/2022 1021   BILITOT 0.41 11/14/2016 0824  RADIOGRAPHIC STUDIES:  US Abdomen Limited RUQ (LIVER/GB)  Result Date: 09/21/2022 CLINICAL DATA:  Cirrhosis EXAM: ULTRASOUND ABDOMEN LIMITED RIGHT UPPER QUADRANT COMPARISON:  PET-CT 03/08/2022 FINDINGS: Gallbladder: Surgically absent Common bile duct: Diameter: 2 mm Liver: Coarsened echogenicity and mildly nodular contour. No focal lesion. Portal vein is patent on color Doppler imaging with normal direction of blood flow towards the liver. Other: None. IMPRESSION: Cirrhotic morphology of the liver. No focal lesion. Electronically Signed   By: Annia Belt M.D.   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 Her myeloma panel showed continuous increase in her free lambda light chain.    The patient previously underwent 107 cycles of subcutaneous weekly Velcade, as well as Revlimid and Decadron on and off over the last several years.  She had been off treatment since June 2021 until July 2022 in which she had evidence of disease progression.  Therefore, she was restarted on Velcade, Revlimid, and Decadron.  She has restarted treatment in July 2022 with cycle 107 of weekly velcade. She tolerated well without any concerning adverse side effects.  She was then given a break.  She was seen recently by Dr. Marissa Calamity at [facility] for second opinion and after extensive investigation is recommended for her to resume her treatment for the multiple myeloma with daratumumab, Pomalyst 3 Mg p.o. daily for 21 days every 4 weeks and Decadron 20 mg weekly. This was started in September 2024. She is tolerating it fair except today she has some progressive cytopenias.   Last week she had some repeat myeloma labs that due to already shows some improvement  in the lambda free light chains.   He has neutropenia on labs today with a total white blood cell count of 0.8 and ANC of 0.2.  Denies any signs and symptoms of infection.  I did caution the patient that should she develop any signs and symptoms of infection she would need to be evaluated promptly.  We also discussed washing fruits and vegetables, avoiding raw food, and avoiding sick contacts or crowded areas.  We reviewed signs and symptoms that would warrant medical evaluation.  Will arrange for Zarzio injections daily x 3.  We will see if we could obtain insurance authorization to administer the first dose today.  Will then recheck her labs next week.  The patient is currently off her Pomalyst and completed this yesterday.  She will be off this for 7 days.  I let her know that G-CSF injections may cause some arthralgias and to use Tylenol and Claritin if needed.  The patient was advised to call immediately if she has any concerning symptoms in the interval. The patient voices understanding of current disease status and treatment options and is in agreement with the current care plan. All questions were answered. The patient knows to call the clinic with any problems, questions or concerns. We can certainly see the patient much sooner if necessary  No orders of the defined types were placed in this encounter.   Sary Bogie L Delonda Coley, PA-C 10/15/22  ADDENDUM: Hematology/Oncology Attending:  I had a face-to-face encounter with the patient today.  I reviewed her records, lab and recommended her care plan.  This is a very pleasant 62 years old white female with history of plasma cell dyscrasia consistent with multiple myeloma that was initially diagnosed as MGUS in September 2010.  She is status post several  treatment regimens in the past and currently on second line treatment with daratumumab subcutaneously, Pomalyst as well as Decadron status post day 15 of cycle #1.  The patient was  supposed to start the #22 of cycle #1 today but she had significant drug-induced neutropenia likely from her treatment with Pomalyst. She also had repeat serum light chain recently that showed significant improvement in of her free lambda light chain after 2 weeks of treatment. I recommended for the patient to skip day #22 of her treatment today because of the significant neutropenia.  For the chemotherapy-induced neutropenia, we will give the patient filgrastim injection 480 Mg subcutaneously for 3 days. She will receive the first injection today. She will come back for follow-up visit next week for evaluation before starting cycle #2. The patient was advised to call immediately if she has any other concerning symptoms in the interval. The total time spent in the appointment was 20 minutes. Disclaimer: This note was dictated with voice recognition software. Similar sounding words can inadvertently be transcribed and may be missed upon review. Lajuana Matte, MD

## 2022-10-17 ENCOUNTER — Other Ambulatory Visit: Payer: Self-pay

## 2022-10-18 ENCOUNTER — Other Ambulatory Visit: Payer: Self-pay | Admitting: Physician Assistant

## 2022-10-18 ENCOUNTER — Inpatient Hospital Stay: Payer: 59

## 2022-10-18 ENCOUNTER — Telehealth: Payer: Self-pay | Admitting: Physician Assistant

## 2022-10-18 ENCOUNTER — Inpatient Hospital Stay: Payer: 59 | Admitting: Physician Assistant

## 2022-10-18 VITALS — BP 135/60 | Temp 98.1°F | Resp 16 | Wt 268.3 lb

## 2022-10-18 DIAGNOSIS — T451X5A Adverse effect of antineoplastic and immunosuppressive drugs, initial encounter: Secondary | ICD-10-CM | POA: Insufficient documentation

## 2022-10-18 DIAGNOSIS — D709 Neutropenia, unspecified: Secondary | ICD-10-CM

## 2022-10-18 DIAGNOSIS — Z5112 Encounter for antineoplastic immunotherapy: Secondary | ICD-10-CM | POA: Diagnosis not present

## 2022-10-18 DIAGNOSIS — C9000 Multiple myeloma not having achieved remission: Secondary | ICD-10-CM | POA: Diagnosis not present

## 2022-10-18 LAB — CBC WITH DIFFERENTIAL (CANCER CENTER ONLY)
Abs Immature Granulocytes: 0 10*3/uL (ref 0.00–0.07)
Basophils Absolute: 0 10*3/uL (ref 0.0–0.1)
Basophils Relative: 4 %
Eosinophils Absolute: 0.1 10*3/uL (ref 0.0–0.5)
Eosinophils Relative: 7 %
HCT: 28.9 % — ABNORMAL LOW (ref 36.0–46.0)
Hemoglobin: 9.3 g/dL — ABNORMAL LOW (ref 12.0–15.0)
Immature Granulocytes: 0 %
Lymphocytes Relative: 41 %
Lymphs Abs: 0.3 10*3/uL — ABNORMAL LOW (ref 0.7–4.0)
MCH: 26.1 pg (ref 26.0–34.0)
MCHC: 32.2 g/dL (ref 30.0–36.0)
MCV: 81.2 fL (ref 80.0–100.0)
Monocytes Absolute: 0.2 10*3/uL (ref 0.1–1.0)
Monocytes Relative: 27 %
Neutro Abs: 0.2 10*3/uL — CL (ref 1.7–7.7)
Neutrophils Relative %: 21 %
Platelet Count: 72 10*3/uL — ABNORMAL LOW (ref 150–400)
RBC: 3.56 MIL/uL — ABNORMAL LOW (ref 3.87–5.11)
RDW: 16.5 % — ABNORMAL HIGH (ref 11.5–15.5)
Smear Review: NORMAL
WBC Count: 0.8 10*3/uL — CL (ref 4.0–10.5)
nRBC: 0 % (ref 0.0–0.2)

## 2022-10-18 NOTE — Telephone Encounter (Signed)
I called the patient to let her know I cancelled the injection appointment for today, pending insurance approval. I will keep the injections scheduled at noon tomorrow and Saturday. I will talk to Dr. Arbutus Ped if he would also like to bring her in on Monday 10/14 for another injection. We will recheck her labs prior to possibly proceeding with infusion on Wednesday 10/16. She expressed understanding.

## 2022-10-18 NOTE — Progress Notes (Signed)
CRITICAL VALUE STICKER  CRITICAL VALUE: WBC 0.8, ANC: 0.2  DATE & TIME NOTIFIED: 10/18/22  1136  MD NOTIFIEDMohamed   TIME OF NOTIFICATION: 11;45 :

## 2022-10-19 ENCOUNTER — Inpatient Hospital Stay: Payer: 59

## 2022-10-19 VITALS — BP 136/69 | HR 66 | Temp 98.1°F | Resp 17

## 2022-10-19 DIAGNOSIS — Z5112 Encounter for antineoplastic immunotherapy: Secondary | ICD-10-CM | POA: Diagnosis not present

## 2022-10-19 DIAGNOSIS — T451X5A Adverse effect of antineoplastic and immunosuppressive drugs, initial encounter: Secondary | ICD-10-CM

## 2022-10-19 MED ORDER — FILGRASTIM-SNDZ 480 MCG/0.8ML IJ SOSY
480.0000 ug | PREFILLED_SYRINGE | Freq: Once | INTRAMUSCULAR | Status: AC
  Administered 2022-10-19: 480 ug via SUBCUTANEOUS
  Filled 2022-10-19: qty 0.8

## 2022-10-19 NOTE — Patient Instructions (Signed)
Filgrastim Injection What is this medication? FILGRASTIM (fil GRA stim) lowers the risk of infection in people who are receiving chemotherapy. It works by helping your body make more white blood cells, which protects your body from infection. It may also be used to help people who have been exposed to high doses of radiation. It can be used to help prepare your body before a stem cell transplant. It works by helping your bone marrow make and release stem cells into the blood. This medicine may be used for other purposes; ask your health care provider or pharmacist if you have questions. COMMON BRAND NAME(S): Neupogen, Nivestym, Releuko, Zarxio What should I tell my care team before I take this medication? They need to know if you have any of these conditions: History of blood diseases, such as sickle cell anemia Kidney disease Recent or ongoing radiation An unusual or allergic reaction to filgrastim, pegfilgrastim, latex, rubber, other medications, foods, dyes, or preservatives Pregnant or trying to get pregnant Breast-feeding How should I use this medication? This medication is injected under the skin or into a vein. It is usually given by your care team in a hospital or clinic setting. It may be given at home. If you get this medication at home, you will be taught how to prepare and give it. Use exactly as directed. Take it as directed on the prescription label at the same time every day. Keep taking it unless your care team tells you to stop. It is important that you put your used needles and syringes in a special sharps container. Do not put them in a trash can. If you do not have a sharps container, call your pharmacist or care team to get one. This medication comes with INSTRUCTIONS FOR USE. Ask your pharmacist for directions on how to use this medication. Read the information carefully. Talk to your pharmacist or care team if you have questions. Talk to your care team about the use of this  medication in children. While it may be prescribed for children for selected conditions, precautions do apply. Overdosage: If you think you have taken too much of this medicine contact a poison control center or emergency room at once. NOTE: This medicine is only for you. Do not share this medicine with others. What if I miss a dose? It is important not to miss any doses. Talk to your care team about what to do if you miss a dose. What may interact with this medication? Medications that may cause a release of neutrophils, such as lithium This list may not describe all possible interactions. Give your health care provider a list of all the medicines, herbs, non-prescription drugs, or dietary supplements you use. Also tell them if you smoke, drink alcohol, or use illegal drugs. Some items may interact with your medicine. What should I watch for while using this medication? Your condition will be monitored carefully while you are receiving this medication. You may need bloodwork while taking this medication. Talk to your care team about your risk of cancer. You may be more at risk for certain types of cancer if you take this medication. What side effects may I notice from receiving this medication? Side effects that you should report to your care team as soon as possible: Allergic reactions--skin rash, itching, hives, swelling of the face, lips, tongue, or throat Capillary leak syndrome--stomach or muscle pain, unusual weakness or fatigue, feeling faint or lightheaded, decrease in the amount of urine, swelling of the ankles, hands, or   feet, trouble breathing High white blood cell level--fever, fatigue, trouble breathing, night sweats, change in vision, weight loss Inflammation of the aorta--fever, fatigue, back, chest, or stomach pain, severe headache Kidney injury (glomerulonephritis)--decrease in the amount of urine, red or dark brown urine, foamy or bubbly urine, swelling of the ankles, hands, or  feet Shortness of breath or trouble breathing Spleen injury--pain in upper left stomach or shoulder Unusual bruising or bleeding Side effects that usually do not require medical attention (report to your care team if they continue or are bothersome): Back pain Bone pain Fatigue Fever Headache Nausea This list may not describe all possible side effects. Call your doctor for medical advice about side effects. You may report side effects to FDA at 1-800-FDA-1088. Where should I keep my medication? Keep out of the reach of children and pets. Keep this medication in the original packaging until you are ready to take it. Protect from light. See product for storage information. Each product may have different instructions. Get rid of any unused medication after the expiration date. To get rid of medications that are no longer needed or have expired: Take the medication to a medications take-back program. Check with your pharmacy or law enforcement to find a location. If you cannot return the medication, ask your pharmacist or care team how to get rid of this medication safely. NOTE: This sheet is a summary. It may not cover all possible information. If you have questions about this medicine, talk to your doctor, pharmacist, or health care provider.  2024 Elsevier/Gold Standard (2021-05-18 00:00:00)  

## 2022-10-20 ENCOUNTER — Inpatient Hospital Stay: Payer: 59

## 2022-10-20 VITALS — BP 125/67 | HR 82 | Temp 98.2°F

## 2022-10-20 DIAGNOSIS — Z5112 Encounter for antineoplastic immunotherapy: Secondary | ICD-10-CM | POA: Diagnosis not present

## 2022-10-20 DIAGNOSIS — D701 Agranulocytosis secondary to cancer chemotherapy: Secondary | ICD-10-CM

## 2022-10-20 MED ORDER — FILGRASTIM-SNDZ 480 MCG/0.8ML IJ SOSY
480.0000 ug | PREFILLED_SYRINGE | Freq: Once | INTRAMUSCULAR | Status: AC
  Administered 2022-10-20: 480 ug via SUBCUTANEOUS
  Filled 2022-10-20: qty 0.8

## 2022-10-22 ENCOUNTER — Encounter: Payer: Self-pay | Admitting: Internal Medicine

## 2022-10-24 ENCOUNTER — Inpatient Hospital Stay: Payer: 59

## 2022-10-24 DIAGNOSIS — C9000 Multiple myeloma not having achieved remission: Secondary | ICD-10-CM

## 2022-10-24 DIAGNOSIS — T451X5A Adverse effect of antineoplastic and immunosuppressive drugs, initial encounter: Secondary | ICD-10-CM

## 2022-10-24 DIAGNOSIS — Z5112 Encounter for antineoplastic immunotherapy: Secondary | ICD-10-CM | POA: Diagnosis not present

## 2022-10-24 LAB — CMP (CANCER CENTER ONLY)
ALT: 28 U/L (ref 0–44)
AST: 42 U/L — ABNORMAL HIGH (ref 15–41)
Albumin: 3.8 g/dL (ref 3.5–5.0)
Alkaline Phosphatase: 132 U/L — ABNORMAL HIGH (ref 38–126)
Anion gap: 11 (ref 5–15)
BUN: 8 mg/dL (ref 8–23)
CO2: 22 mmol/L (ref 22–32)
Calcium: 8.6 mg/dL — ABNORMAL LOW (ref 8.9–10.3)
Chloride: 101 mmol/L (ref 98–111)
Creatinine: 0.74 mg/dL (ref 0.44–1.00)
GFR, Estimated: >60 mL/min (ref >60)
Glucose, Bld: 174 mg/dL — ABNORMAL HIGH (ref 70–99)
Potassium: 3.7 mmol/L (ref 3.5–5.1)
Sodium: 134 mmol/L — ABNORMAL LOW (ref 135–145)
Total Bilirubin: 0.7 mg/dL (ref 0.3–1.2)
Total Protein: 6.8 g/dL (ref 6.5–8.1)

## 2022-10-24 LAB — CBC WITH DIFFERENTIAL (CANCER CENTER ONLY)
Abs Immature Granulocytes: 0.13 10*3/uL — ABNORMAL HIGH (ref 0.00–0.07)
Basophils Absolute: 0.1 10*3/uL (ref 0.0–0.1)
Basophils Relative: 5 %
Eosinophils Absolute: 0 10*3/uL (ref 0.0–0.5)
Eosinophils Relative: 1 %
HCT: 28.2 % — ABNORMAL LOW (ref 36.0–46.0)
Hemoglobin: 9.1 g/dL — ABNORMAL LOW (ref 12.0–15.0)
Immature Granulocytes: 6 %
Lymphocytes Relative: 32 %
Lymphs Abs: 0.7 10*3/uL (ref 0.7–4.0)
MCH: 26.6 pg (ref 26.0–34.0)
MCHC: 32.3 g/dL (ref 30.0–36.0)
MCV: 82.5 fL (ref 80.0–100.0)
Monocytes Absolute: 0.5 10*3/uL (ref 0.1–1.0)
Monocytes Relative: 22 %
Neutro Abs: 0.7 10*3/uL — ABNORMAL LOW (ref 1.7–7.7)
Neutrophils Relative %: 34 %
Platelet Count: 108 10*3/uL — ABNORMAL LOW (ref 150–400)
RBC: 3.42 MIL/uL — ABNORMAL LOW (ref 3.87–5.11)
RDW: 17.9 % — ABNORMAL HIGH (ref 11.5–15.5)
Smear Review: NORMAL
WBC Count: 2.2 10*3/uL — ABNORMAL LOW (ref 4.0–10.5)
nRBC: 0.9 % — ABNORMAL HIGH (ref 0.0–0.2)

## 2022-10-24 MED ORDER — FILGRASTIM-SNDZ 480 MCG/0.8ML IJ SOSY
480.0000 ug | PREFILLED_SYRINGE | Freq: Once | INTRAMUSCULAR | Status: AC
  Administered 2022-10-24: 480 ug via SUBCUTANEOUS

## 2022-10-24 NOTE — Progress Notes (Signed)
Patient here today for Darzalex infusion. Her ANC is 0.7 today per provider. They want to hold treatment and give zarxio injection today.

## 2022-10-27 ENCOUNTER — Other Ambulatory Visit: Payer: Self-pay

## 2022-10-31 ENCOUNTER — Inpatient Hospital Stay: Payer: 59

## 2022-10-31 ENCOUNTER — Inpatient Hospital Stay (HOSPITAL_BASED_OUTPATIENT_CLINIC_OR_DEPARTMENT_OTHER): Payer: 59 | Admitting: Internal Medicine

## 2022-10-31 DIAGNOSIS — C9000 Multiple myeloma not having achieved remission: Secondary | ICD-10-CM | POA: Diagnosis not present

## 2022-10-31 DIAGNOSIS — Z5112 Encounter for antineoplastic immunotherapy: Secondary | ICD-10-CM | POA: Diagnosis not present

## 2022-10-31 LAB — CBC WITH DIFFERENTIAL (CANCER CENTER ONLY)
Abs Immature Granulocytes: 0.01 10*3/uL (ref 0.00–0.07)
Basophils Absolute: 0.1 10*3/uL (ref 0.0–0.1)
Basophils Relative: 3 %
Eosinophils Absolute: 0 10*3/uL (ref 0.0–0.5)
Eosinophils Relative: 1 %
HCT: 28.7 % — ABNORMAL LOW (ref 36.0–46.0)
Hemoglobin: 9 g/dL — ABNORMAL LOW (ref 12.0–15.0)
Immature Granulocytes: 0 %
Lymphocytes Relative: 24 %
Lymphs Abs: 0.7 10*3/uL (ref 0.7–4.0)
MCH: 25.9 pg — ABNORMAL LOW (ref 26.0–34.0)
MCHC: 31.4 g/dL (ref 30.0–36.0)
MCV: 82.7 fL (ref 80.0–100.0)
Monocytes Absolute: 0.2 10*3/uL (ref 0.1–1.0)
Monocytes Relative: 8 %
Neutro Abs: 1.8 10*3/uL (ref 1.7–7.7)
Neutrophils Relative %: 64 %
Platelet Count: 112 10*3/uL — ABNORMAL LOW (ref 150–400)
RBC: 3.47 MIL/uL — ABNORMAL LOW (ref 3.87–5.11)
RDW: 18 % — ABNORMAL HIGH (ref 11.5–15.5)
WBC Count: 2.8 10*3/uL — ABNORMAL LOW (ref 4.0–10.5)
nRBC: 0 % (ref 0.0–0.2)

## 2022-10-31 LAB — CMP (CANCER CENTER ONLY)
ALT: 21 U/L (ref 0–44)
AST: 38 U/L (ref 15–41)
Albumin: 3.9 g/dL (ref 3.5–5.0)
Alkaline Phosphatase: 143 U/L — ABNORMAL HIGH (ref 38–126)
Anion gap: 8 (ref 5–15)
BUN: 10 mg/dL (ref 8–23)
CO2: 23 mmol/L (ref 22–32)
Calcium: 8.9 mg/dL (ref 8.9–10.3)
Chloride: 106 mmol/L (ref 98–111)
Creatinine: 0.75 mg/dL (ref 0.44–1.00)
GFR, Estimated: >60 mL/min (ref >60)
Glucose, Bld: 122 mg/dL — ABNORMAL HIGH (ref 70–99)
Potassium: 4 mmol/L (ref 3.5–5.1)
Sodium: 137 mmol/L (ref 135–145)
Total Bilirubin: 0.7 mg/dL (ref 0.3–1.2)
Total Protein: 6.8 g/dL (ref 6.5–8.1)

## 2022-10-31 MED ORDER — DEXAMETHASONE 4 MG PO TABS
20.0000 mg | ORAL_TABLET | Freq: Once | ORAL | Status: AC
Start: 2022-10-31 — End: 2022-10-31
  Administered 2022-10-31: 20 mg via ORAL
  Filled 2022-10-31: qty 5

## 2022-10-31 MED ORDER — DARATUMUMAB-HYALURONIDASE-FIHJ 1800-30000 MG-UT/15ML ~~LOC~~ SOLN
1800.0000 mg | Freq: Once | SUBCUTANEOUS | Status: AC
  Administered 2022-10-31: 1800 mg via SUBCUTANEOUS
  Filled 2022-10-31: qty 15

## 2022-10-31 MED ORDER — ACETAMINOPHEN 325 MG PO TABS
650.0000 mg | ORAL_TABLET | Freq: Once | ORAL | Status: AC
  Administered 2022-10-31: 650 mg via ORAL
  Filled 2022-10-31: qty 2

## 2022-10-31 MED ORDER — DIPHENHYDRAMINE HCL 25 MG PO CAPS
50.0000 mg | ORAL_CAPSULE | Freq: Once | ORAL | Status: AC
  Administered 2022-10-31: 50 mg via ORAL
  Filled 2022-10-31: qty 2

## 2022-10-31 NOTE — Progress Notes (Signed)
[facility] [phone]   Fax:([phone]) [phone]  OFFICE PROGRESS NOTE  [name], MD [address] [place] [address]  DIAGNOSIS: Plasma cell dyscrasia initially diagnosed as MGUS in September 2010, with additional symptoms suggestive of POEMS syndrome.   PRIOR THERAPY:  1) Velcade 1.3 MG/M2 subcutaneously with Decadron 40 mg by mouth on a weekly basis. First cycle 11/24/2013. She status post 31 weekly doses of treatment. 2) Velcade 1.3 MG/M2 subcutaneously and weekly basis with Decadron 40 mg by mouth weekly. First dose 02/01/2015. Status post 28 cycles. 3) Revlimid 25 mg by mouth daily for 21 days every 4 weeks with weekly Decadron 20 mg. started in 11/27/2015. Status post 3 cycles discontinued secondary to lack of response. 4) Systemic treatment with Velcade 1.3 MG/KG weekly, Revlimid 25 mg by mouth daily for 21 days every 4 weeks in addition to Decadron 20 mg by mouth weekly. First dose 03/06/2016. Status post 42 cycles.  She has a break off treatment from June 2021 until July 2022.  CURRENT THERAPY: Second line treatment with daratumumab, Pomalyst 3 mg for 21 days every 4 weeks as well as Decadron 20 mg p.o. weekly.  First dose 09/26/2022.  Currently receiving cycle #1.  INTERVAL HISTORY: Brenda Conner 62 y.o. female returns to the clinic today for follow-up visit.Discussed the use of AI scribe software for clinical note transcription with the patient, who gave verbal consent to proceed.  History of Present Illness   Brenda Conner, a 62 year old patient with a history of MGUS progressing to multiple myeloma, has been undergoing treatment with subcutaneous daratumumab, oral pomalyst, and Decadron every four weeks. They have completed one cycle of this regimen, with three treatments of daratumumab and one full cycle of Pomalyst. However, they missed day 22 of cycle number one due to low counts.  They are not currently on iron  supplements. Despite the fatigue, their white blood count has improved, and their platelets are within acceptable range for treatment.       MEDICAL HISTORY: Past Medical History:  Diagnosis Date   Acid reflux    Anemia    Anxiety    Asthma    Cancer (HCC)    waldenstroms/ macroglobinulemia   Depression    Depression    Diabetes mellitus without complication (HCC)    Dysuria 02/28/2016   Gallstones    Heart palpitations    Hypercholesteremia    Hypertension    Hypothyroidism    Macroglobulinemia    ? POEMS syndrome   Monoclonal gammopathy of unknown significance (MGUS)    Multiple myeloma (HCC)    Obesity    POEMS syndrome    PONV (postoperative nausea and vomiting)    Sleep apnea    CPAP at bedtime    ALLERGIES:  is allergic to codeine, hydrocodone, lortab [hydrocodone-acetaminophen], onion, shellfish allergy, and amoxicillin.  MEDICATIONS:  Current Outpatient Medications  Medication Sig Dispense Refill   ACCU-CHEK AVIVA PLUS test strip USE TO CHECK SUGAR TWICE A DAY 90     acyclovir (ZOVIRAX) 400 MG tablet Take 1 tablet (400 mg total) by mouth 2 (two) times daily. 60 tablet 11   ALPRAZolam (XANAX) 0.5 MG tablet Take 1 tablet (0.5 mg total) by mouth at bedtime as needed for anxiety. 30 tablet 1   aspirin 81 MG chewable tablet Chew by mouth daily.     Azelastine HCl 0.15 % SOLN as needed.     B-D  UF III MINI PEN NEEDLES 31G X 5 MM MISC      Blood Glucose Monitoring Suppl (ONE TOUCH ULTRA MINI) W/DEVICE KIT See admin instructions. Reported on 01/25/2015  0   cetirizine (ZYRTEC ALLERGY) 10 MG tablet Take 1 tablet by mouth daily.     dexamethasone (DECADRON) 4 MG tablet Take 5 tablets (20 mg total) by mouth once a week. Take the day after darzalex faspro. Take with breakfast. 20 tablet 11   DiphenhydrAMINE HCl (BENADRYL ALLERGY PO) Take 1 Dose by mouth daily at 6 (six) AM.     fexofenadine (ALLEGRA ALLERGY) 180 MG tablet Take 1 tablet by mouth daily.      Fluticasone-Salmeterol (ADVAIR) 100-50 MCG/DOSE AEPB Inhale 2 puffs into the lungs every 12 (twelve) hours.     glucosamine-chondroitin 500-400 MG tablet Take 1 tablet by mouth daily.     ibuprofen (ADVIL,MOTRIN) 100 MG tablet Take 100 mg by mouth every 6 (six) hours as needed. Reported on 05/31/2015     Insulin Glargine (BASAGLAR KWIKPEN) 100 UNIT/ML Inject 20 Units into the skin at bedtime.     levothyroxine (SYNTHROID) 137 MCG tablet Take 137 mcg by mouth daily.     lisinopril (PRINIVIL,ZESTRIL) 10 MG tablet Take 10 mg by mouth daily. auth number 05/29/2018 [id number]  3   loperamide (IMODIUM) 2 MG capsule Take 2 mg by mouth as needed for diarrhea or loose stools.     magic mouthwash w/lidocaine SOLN Take 5 mLs by mouth 4 (four) times daily as needed for mouth pain. Swish, Gargle, and spit 240 mL 1   Melatonin 5 MG CAPS Take 1 capsule by mouth at bedtime.     metFORMIN (GLUCOPHAGE-XR) 500 MG 24 hr tablet Take 500 mg by mouth daily.     NOVOLOG FLEXPEN 100 UNIT/ML FlexPen Inject 15 Units into the skin in the morning, at noon, and at bedtime. Per Sliding Scale     omeprazole (PRILOSEC) 20 MG capsule Take 20 mg by mouth 2 (two) times daily.     ondansetron (ZOFRAN) 8 MG tablet Take 1 tablet (8 mg total) by mouth every 8 (eight) hours as needed for nausea or vomiting. 30 tablet 1   pomalidomide (POMALYST) 3 MG capsule Take 1 capsule (3 mg total) by mouth daily. for 21 days and off for 7 days.[name] #  [id number]    Date Obtained 10/11/2022 Adult female not of childbearing potential 21 capsule 0   pravastatin (PRAVACHOL) 80 MG tablet Take 80 mg by mouth daily.     PROAIR HFA 108 (90 Base) MCG/ACT inhaler Reported on 06/21/2015  1   prochlorperazine (COMPAZINE) 10 MG tablet Take 1 tablet (10 mg total) by mouth every 6 (six) hours as needed for nausea or vomiting. 30 tablet 1   sertraline (ZOLOFT) 50 MG tablet TAKE 3 TABLETS BY MOUTH EVERY DAY 270 tablet 1   verapamil (CALAN-SR) 240 MG CR tablet Take 240  mg by mouth 2 (two) times daily.     No current facility-administered medications for this visit.    SURGICAL HISTORY:  Past Surgical History:  Procedure Laterality Date   ABLATION  09/09/2007   HTA and polyp resection   BONE MARROW BIOPSY  04/08/2012   BONE MARROW BIOPSY  01/2020   CATARACT EXTRACTION, BILATERAL Bilateral 02/2022   FOOT SURGERY Bilateral 01/09/1996   small toe   KNEE ARTHROSCOPY W/ MENISCAL REPAIR Bilateral 10/10 ; 3/11   LAPAROSCOPIC CHOLECYSTECTOMY  01/09/1995   LUMBAR DISC SURGERY  03/08/2004  herniation, L4-L5   NASAL SINUS SURGERY  01/08/2002   UTERINE FIBROID EMBOLIZATION  2009   HTA and polyp resection    REVIEW OF SYSTEMS:  A comprehensive review of systems was negative except for: Constitutional: positive for fatigue   PHYSICAL EXAMINATION: General appearance: alert, cooperative, fatigued, and no distress Head: Normocephalic, without obvious abnormality, atraumatic Neck: no adenopathy Lymph nodes: Cervical, supraclavicular, and axillary nodes normal. Resp: clear to auscultation bilaterally Back: symmetric, no curvature. ROM normal. No CVA tenderness. Cardio: regular rate and rhythm, S1, S2 normal, no murmur, click, rub or gallop GI: soft, non-tender; bowel sounds normal; no masses,  no organomegaly Extremities: extremities normal, atraumatic, no cyanosis or edema  ECOG PERFORMANCE STATUS: 1 - Symptomatic but completely ambulatory  Blood pressure (!) 147/74, pulse 62, temperature (!) 97 F (36.1 C), temperature source Oral, resp. rate 17, height 5\' 7"  (1.702 m), weight 270 lb 8 oz (122.7 kg), SpO2 100%.  LABORATORY DATA: Lab Results  Component Value Date   WBC 2.8 (L) 10/31/2022   HGB 9.0 (L) 10/31/2022   HCT 28.7 (L) 10/31/2022   MCV 82.7 10/31/2022   PLT 112 (L) 10/31/2022      Chemistry      Component Value Date/Time   NA 134 (L) 10/24/2022 1220   NA 138 11/14/2016 0824   K 3.7 10/24/2022 1220   K 4.3 11/14/2016 0824   CL 101  10/24/2022 1220   CL 104 04/07/2012 1415   CO2 22 10/24/2022 1220   CO2 23 11/14/2016 0824   BUN 8 10/24/2022 1220   BUN 9.1 11/14/2016 0824   CREATININE 0.74 10/24/2022 1220   CREATININE 0.7 11/14/2016 0824      Component Value Date/Time   CALCIUM 8.6 (L) 10/24/2022 1220   CALCIUM 9.6 11/14/2016 0824   ALKPHOS 132 (H) 10/24/2022 1220   ALKPHOS 94 11/14/2016 0824   AST 42 (H) 10/24/2022 1220   AST 28 11/14/2016 0824   ALT 28 10/24/2022 1220   ALT 24 11/14/2016 0824   BILITOT 0.7 10/24/2022 1220   BILITOT 0.41 11/14/2016 0824      ASSESSMENT AND PLAN:  This is a very pleasant 62 years old white female with multiple myeloma with questionable POEMS syndrome symptom.  The patient has been on treatment with subcutaneous weekly Velcade as well as Revlimid and Decadron status post 34 cycles.  She has been tolerating this treatment well. The patient has been on observation for the last several months and she has been doing fine with no concerning issues except for the recent upper respiratory infection and viral gastroenteritis. Her myeloma panel showed continuous increase in her free lambda light chain. The patient had a bone marrow biopsy and aspirate recently that showed 3-5% plasma cells still suspicious for plasma cell dyscrasia.  The skeletal bone survey was negative for any lytic lesions. The patient has been off treatment for more than a year between June 2021 until July 2022. She had repeat myeloma panel that showed significant worsening and increase of her lambda light chain.  She continues to have anemia but no renal insufficiency or hypercalcemia. She resumed her treatment with Velcade, Revlimid and Decadron on July 19, 2020.   The patient is currently on observation and she is feeling fine with no concerning complaints. She was seen recently by Dr. Marissa Calamity at [facility] for second opinion and after extensive investigation is recommended for her to resume her treatment  for the multiple myeloma with daratumumab, Pomalyst 3 Mg  p.o. daily for 21 days every 4 weeks and Decadron 20 mg weekly.  She is status post 1 cycle.    Multiple Myeloma Currently on a regimen of subcutaneous daratumumab, oral pomalyst, and Decadron every four weeks. Missed day 22 of cycle 1 due to low counts. Counts have improved and are acceptable for treatment. -Start cycle 2 of treatment today with Pomalyst and daratumumab. -Continue daratumumab on day 1, 8, 15, and 22 of every four weeks. -Continue Decadron for two days in a row starting today.  Anemia Likely contributing to fatigue. Not currently on iron supplements. -Start over-the-counter iron supplements every other day with orange juice.  Follow-up in two weeks.   The patient will come back for follow-up visit in 2 weeks for evaluation before day 15 of cycle #2. She was advised to call immediately if she has any other concerning symptoms in the interval.  All questions were answered. The patient knows to call the clinic with any problems, questions or concerns. We can certainly see the patient much sooner if necessary.  Disclaimer: This note was dictated with voice recognition software. Similar sounding words can inadvertently be transcribed and may not be corrected upon review.

## 2022-10-31 NOTE — Patient Instructions (Signed)
[facility]  Discharge Instructions: Thank you for choosing [facility] to provide your oncology and hematology care.   If you have a lab appointment with the Cancer Center, please go directly to the Cancer Center and check in at the registration area.   Wear comfortable clothing and clothing appropriate for easy access to any Portacath or PICC line.   We strive to give you quality time with your provider. You may need to reschedule your appointment if you arrive late (15 or more minutes).  Arriving late affects you and other patients whose appointments are after yours.  Also, if you miss three or more appointments without notifying the office, you may be dismissed from the clinic at the provider's discretion.      For prescription refill requests, have your pharmacy contact our office and allow 72 hours for refills to be completed.    Today you received the following chemotherapy and/or immunotherapy agents Darzalex Faspro      To help prevent nausea and vomiting after your treatment, we encourage you to take your nausea medication as directed.  BELOW ARE SYMPTOMS THAT SHOULD BE REPORTED IMMEDIATELY: *FEVER GREATER THAN 100.4 F (38 C) OR HIGHER *CHILLS OR SWEATING *NAUSEA AND VOMITING THAT IS NOT CONTROLLED WITH YOUR NAUSEA MEDICATION *UNUSUAL SHORTNESS OF BREATH *UNUSUAL BRUISING OR BLEEDING *URINARY PROBLEMS (pain or burning when urinating, or frequent urination) *BOWEL PROBLEMS (unusual diarrhea, constipation, pain near the anus) TENDERNESS IN MOUTH AND THROAT WITH OR WITHOUT PRESENCE OF ULCERS (sore throat, sores in mouth, or a toothache) UNUSUAL RASH, SWELLING OR PAIN  UNUSUAL VAGINAL DISCHARGE OR ITCHING   Items with * indicate a potential emergency and should be followed up as soon as possible or go to the Emergency Department if any problems should occur.  Please show the CHEMOTHERAPY ALERT CARD or IMMUNOTHERAPY ALERT CARD at  check-in to the Emergency Department and triage nurse.  Should you have questions after your visit or need to cancel or reschedule your appointment, please contact [facility]  Dept: [phone]  and follow the prompts.  Office hours are 8:00 a.m. to 4:30 p.m. Monday - Friday. Please note that voicemails left after 4:00 p.m. may not be returned until the following business day.  We are closed weekends and major holidays. You have access to a nurse at all times for urgent questions. Please call the main number to the clinic Dept: [phone] and follow the prompts.   For any non-urgent questions, you may also contact your provider using MyChart. We now offer e-Visits for anyone 18 and older to request care online for non-urgent symptoms. For details visit [email or web address].   Also download the MyChart app! Go to the app store, search "MyChart", open the app, select [facility], and log in with your MyChart username and password.  

## 2022-11-01 ENCOUNTER — Encounter (HOSPITAL_BASED_OUTPATIENT_CLINIC_OR_DEPARTMENT_OTHER): Payer: Self-pay | Admitting: Obstetrics & Gynecology

## 2022-11-01 DIAGNOSIS — F419 Anxiety disorder, unspecified: Secondary | ICD-10-CM

## 2022-11-02 ENCOUNTER — Encounter: Payer: Self-pay | Admitting: Medical Oncology

## 2022-11-02 ENCOUNTER — Other Ambulatory Visit: Payer: Self-pay | Admitting: Medical Oncology

## 2022-11-02 DIAGNOSIS — C9000 Multiple myeloma not having achieved remission: Secondary | ICD-10-CM

## 2022-11-02 MED ORDER — ALPRAZOLAM 0.5 MG PO TABS
0.5000 mg | ORAL_TABLET | Freq: Every evening | ORAL | 0 refills | Status: DC | PRN
Start: 2022-11-02 — End: 2023-02-12

## 2022-11-02 MED ORDER — POMALIDOMIDE 3 MG PO CAPS: 3.0000 mg | ORAL_CAPSULE | Freq: Every day | ORAL | 0 refills | Status: DC

## 2022-11-02 NOTE — Telephone Encounter (Signed)
Pomalyst refilled.

## 2022-11-03 ENCOUNTER — Other Ambulatory Visit: Payer: Self-pay

## 2022-11-07 ENCOUNTER — Inpatient Hospital Stay: Payer: 59

## 2022-11-07 VITALS — BP 138/70 | HR 62 | Temp 98.2°F | Resp 19 | Wt 270.0 lb

## 2022-11-07 DIAGNOSIS — Z5112 Encounter for antineoplastic immunotherapy: Secondary | ICD-10-CM | POA: Diagnosis not present

## 2022-11-07 DIAGNOSIS — D472 Monoclonal gammopathy: Secondary | ICD-10-CM

## 2022-11-07 DIAGNOSIS — C9000 Multiple myeloma not having achieved remission: Secondary | ICD-10-CM

## 2022-11-07 LAB — CMP (CANCER CENTER ONLY)
ALT: 25 U/L (ref 0–44)
AST: 27 U/L (ref 15–41)
Albumin: 3.9 g/dL (ref 3.5–5.0)
Alkaline Phosphatase: 126 U/L (ref 38–126)
Anion gap: 7 (ref 5–15)
BUN: 10 mg/dL (ref 8–23)
CO2: 25 mmol/L (ref 22–32)
Calcium: 8.7 mg/dL — ABNORMAL LOW (ref 8.9–10.3)
Chloride: 103 mmol/L (ref 98–111)
Creatinine: 0.7 mg/dL (ref 0.44–1.00)
GFR, Estimated: >60 mL/min (ref >60)
Glucose, Bld: 196 mg/dL — ABNORMAL HIGH (ref 70–99)
Potassium: 3.7 mmol/L (ref 3.5–5.1)
Sodium: 135 mmol/L (ref 135–145)
Total Bilirubin: 0.8 mg/dL (ref 0.3–1.2)
Total Protein: 6.7 g/dL (ref 6.5–8.1)

## 2022-11-07 LAB — CBC WITH DIFFERENTIAL (CANCER CENTER ONLY)
Abs Immature Granulocytes: 0.07 10*3/uL (ref 0.00–0.07)
Basophils Absolute: 0.1 10*3/uL (ref 0.0–0.1)
Basophils Relative: 1 %
Eosinophils Absolute: 0.3 10*3/uL (ref 0.0–0.5)
Eosinophils Relative: 6 %
HCT: 28.1 % — ABNORMAL LOW (ref 36.0–46.0)
Hemoglobin: 8.9 g/dL — ABNORMAL LOW (ref 12.0–15.0)
Immature Granulocytes: 2 %
Lymphocytes Relative: 17 %
Lymphs Abs: 0.7 10*3/uL (ref 0.7–4.0)
MCH: 26.6 pg (ref 26.0–34.0)
MCHC: 31.7 g/dL (ref 30.0–36.0)
MCV: 84.1 fL (ref 80.0–100.0)
Monocytes Absolute: 0.3 10*3/uL (ref 0.1–1.0)
Monocytes Relative: 6 %
Neutro Abs: 2.9 10*3/uL (ref 1.7–7.7)
Neutrophils Relative %: 68 %
Platelet Count: 104 10*3/uL — ABNORMAL LOW (ref 150–400)
RBC: 3.34 MIL/uL — ABNORMAL LOW (ref 3.87–5.11)
RDW: 18.7 % — ABNORMAL HIGH (ref 11.5–15.5)
WBC Count: 4.2 10*3/uL (ref 4.0–10.5)
nRBC: 0 % (ref 0.0–0.2)

## 2022-11-07 MED ORDER — DIPHENHYDRAMINE HCL 25 MG PO CAPS
50.0000 mg | ORAL_CAPSULE | Freq: Once | ORAL | Status: AC
  Administered 2022-11-07: 50 mg via ORAL
  Filled 2022-11-07: qty 2

## 2022-11-07 MED ORDER — ACETAMINOPHEN 325 MG PO TABS
650.0000 mg | ORAL_TABLET | Freq: Once | ORAL | Status: AC
  Administered 2022-11-07: 650 mg via ORAL
  Filled 2022-11-07: qty 2

## 2022-11-07 MED ORDER — DEXAMETHASONE 4 MG PO TABS
20.0000 mg | ORAL_TABLET | Freq: Once | ORAL | Status: AC
Start: 2022-11-07 — End: 2022-11-07
  Administered 2022-11-07: 20 mg via ORAL
  Filled 2022-11-07: qty 5

## 2022-11-07 MED ORDER — DARATUMUMAB-HYALURONIDASE-FIHJ 1800-30000 MG-UT/15ML ~~LOC~~ SOLN
1800.0000 mg | Freq: Once | SUBCUTANEOUS | Status: AC
  Administered 2022-11-07: 1800 mg via SUBCUTANEOUS
  Filled 2022-11-07: qty 15

## 2022-11-15 ENCOUNTER — Inpatient Hospital Stay: Payer: 59 | Attending: Internal Medicine

## 2022-11-15 ENCOUNTER — Inpatient Hospital Stay: Payer: 59 | Admitting: Internal Medicine

## 2022-11-15 ENCOUNTER — Inpatient Hospital Stay: Payer: 59

## 2022-11-15 ENCOUNTER — Encounter: Payer: Self-pay | Admitting: Medical Oncology

## 2022-11-15 DIAGNOSIS — Z79624 Long term (current) use of inhibitors of nucleotide synthesis: Secondary | ICD-10-CM | POA: Insufficient documentation

## 2022-11-15 DIAGNOSIS — Z7952 Long term (current) use of systemic steroids: Secondary | ICD-10-CM | POA: Insufficient documentation

## 2022-11-15 DIAGNOSIS — T380X5A Adverse effect of glucocorticoids and synthetic analogues, initial encounter: Secondary | ICD-10-CM | POA: Diagnosis not present

## 2022-11-15 DIAGNOSIS — D539 Nutritional anemia, unspecified: Secondary | ICD-10-CM | POA: Insufficient documentation

## 2022-11-15 DIAGNOSIS — Z7969 Long term (current) use of other immunomodulators and immunosuppressants: Secondary | ICD-10-CM | POA: Diagnosis not present

## 2022-11-15 DIAGNOSIS — C9000 Multiple myeloma not having achieved remission: Secondary | ICD-10-CM | POA: Diagnosis present

## 2022-11-15 DIAGNOSIS — Z880 Allergy status to penicillin: Secondary | ICD-10-CM | POA: Diagnosis not present

## 2022-11-15 DIAGNOSIS — R5383 Other fatigue: Secondary | ICD-10-CM | POA: Diagnosis not present

## 2022-11-15 DIAGNOSIS — D472 Monoclonal gammopathy: Secondary | ICD-10-CM | POA: Insufficient documentation

## 2022-11-15 DIAGNOSIS — Z885 Allergy status to narcotic agent status: Secondary | ICD-10-CM | POA: Insufficient documentation

## 2022-11-15 DIAGNOSIS — Z79620 Long term (current) use of immunosuppressive biologic: Secondary | ICD-10-CM | POA: Insufficient documentation

## 2022-11-15 DIAGNOSIS — Z9049 Acquired absence of other specified parts of digestive tract: Secondary | ICD-10-CM | POA: Insufficient documentation

## 2022-11-15 DIAGNOSIS — G4700 Insomnia, unspecified: Secondary | ICD-10-CM | POA: Insufficient documentation

## 2022-11-15 DIAGNOSIS — G4730 Sleep apnea, unspecified: Secondary | ICD-10-CM | POA: Diagnosis not present

## 2022-11-15 DIAGNOSIS — Z5112 Encounter for antineoplastic immunotherapy: Secondary | ICD-10-CM | POA: Diagnosis present

## 2022-11-15 DIAGNOSIS — Z79899 Other long term (current) drug therapy: Secondary | ICD-10-CM | POA: Insufficient documentation

## 2022-11-15 LAB — CBC WITH DIFFERENTIAL (CANCER CENTER ONLY)
Abs Immature Granulocytes: 0 10*3/uL (ref 0.00–0.07)
Basophils Absolute: 0 10*3/uL (ref 0.0–0.1)
Basophils Relative: 1 %
Eosinophils Absolute: 0.3 10*3/uL (ref 0.0–0.5)
Eosinophils Relative: 12 %
HCT: 28.9 % — ABNORMAL LOW (ref 36.0–46.0)
Hemoglobin: 9 g/dL — ABNORMAL LOW (ref 12.0–15.0)
Immature Granulocytes: 0 %
Lymphocytes Relative: 22 %
Lymphs Abs: 0.5 10*3/uL — ABNORMAL LOW (ref 0.7–4.0)
MCH: 26.9 pg (ref 26.0–34.0)
MCHC: 31.1 g/dL (ref 30.0–36.0)
MCV: 86.5 fL (ref 80.0–100.0)
Monocytes Absolute: 0.4 10*3/uL (ref 0.1–1.0)
Monocytes Relative: 16 %
Neutro Abs: 1 10*3/uL — ABNORMAL LOW (ref 1.7–7.7)
Neutrophils Relative %: 49 %
Platelet Count: 85 10*3/uL — ABNORMAL LOW (ref 150–400)
RBC: 3.34 MIL/uL — ABNORMAL LOW (ref 3.87–5.11)
RDW: 19.2 % — ABNORMAL HIGH (ref 11.5–15.5)
WBC Count: 2.1 10*3/uL — ABNORMAL LOW (ref 4.0–10.5)
nRBC: 0 % (ref 0.0–0.2)

## 2022-11-15 LAB — CMP (CANCER CENTER ONLY)
ALT: 26 U/L (ref 0–44)
AST: 27 U/L (ref 15–41)
Albumin: 3.9 g/dL (ref 3.5–5.0)
Alkaline Phosphatase: 123 U/L (ref 38–126)
Anion gap: 8 (ref 5–15)
BUN: 8 mg/dL (ref 8–23)
CO2: 26 mmol/L (ref 22–32)
Calcium: 9.1 mg/dL (ref 8.9–10.3)
Chloride: 103 mmol/L (ref 98–111)
Creatinine: 0.74 mg/dL (ref 0.44–1.00)
GFR, Estimated: >60 mL/min (ref >60)
Glucose, Bld: 165 mg/dL — ABNORMAL HIGH (ref 70–99)
Potassium: 4.5 mmol/L (ref 3.5–5.1)
Sodium: 137 mmol/L (ref 135–145)
Total Bilirubin: 0.7 mg/dL (ref <1.2)
Total Protein: 6.7 g/dL (ref 6.5–8.1)

## 2022-11-15 MED ORDER — ACETAMINOPHEN 325 MG PO TABS
650.0000 mg | ORAL_TABLET | Freq: Once | ORAL | Status: AC
Start: 2022-11-15 — End: 2022-11-15
  Administered 2022-11-15: 650 mg via ORAL
  Filled 2022-11-15: qty 2

## 2022-11-15 MED ORDER — DEXAMETHASONE 4 MG PO TABS
20.0000 mg | ORAL_TABLET | Freq: Once | ORAL | Status: AC
Start: 2022-11-15 — End: 2022-11-15
  Administered 2022-11-15: 20 mg via ORAL
  Filled 2022-11-15: qty 5

## 2022-11-15 MED ORDER — DARATUMUMAB-HYALURONIDASE-FIHJ 1800-30000 MG-UT/15ML ~~LOC~~ SOLN
1800.0000 mg | Freq: Once | SUBCUTANEOUS | Status: AC
Start: 2022-11-15 — End: 2022-11-15
  Administered 2022-11-15: 1800 mg via SUBCUTANEOUS
  Filled 2022-11-15: qty 15

## 2022-11-15 MED ORDER — DIPHENHYDRAMINE HCL 25 MG PO CAPS
50.0000 mg | ORAL_CAPSULE | Freq: Once | ORAL | Status: AC
Start: 2022-11-15 — End: 2022-11-15
  Administered 2022-11-15: 50 mg via ORAL
  Filled 2022-11-15: qty 2

## 2022-11-15 NOTE — Progress Notes (Signed)
Patient seen by Dr. Gypsy Balsam are within treatment parameters.  Labs reviewed: and are not all within treatment parameters. Platelets =85k, WBC=2.1, ANC=1.0  Per physician team, patient is ready for treatment and there are NO modifications to the treatment plan. Per Dr. Arbutus Ped ,it is ok to treat pt today with Darzalex and plts =85k, ANC=1..0 and WBC=2.1

## 2022-11-15 NOTE — Progress Notes (Signed)
[facility] [phone]   [phone]  OFFICE PROGRESS NOTE  [name], MD [address] [place] [address]  DIAGNOSIS: Plasma cell dyscrasia initially diagnosed as MGUS in September 2010, with additional symptoms suggestive of POEMS syndrome.   PRIOR THERAPY:  1) Velcade 1.3 MG/M2 subcutaneously with Decadron 40 mg by mouth on a weekly basis. First cycle 11/24/2013. She status post 31 weekly doses of treatment. 2) Velcade 1.3 MG/M2 subcutaneously and weekly basis with Decadron 40 mg by mouth weekly. First dose 02/01/2015. Status post 28 cycles. 3) Revlimid 25 mg by mouth daily for 21 days every 4 weeks with weekly Decadron 20 mg. started in 11/27/2015. Status post 3 cycles discontinued secondary to lack of response. 4) Systemic treatment with Velcade 1.3 MG/KG weekly, Revlimid 25 mg by mouth daily for 21 days every 4 weeks in addition to Decadron 20 mg by mouth weekly. First dose 03/06/2016. Status post 42 cycles.  She has a break off treatment from June 2021 until July 2022.  CURRENT THERAPY: Second line treatment with daratumumab, Pomalyst 3 mg for 21 days every 4 weeks as well as Decadron 20 mg p.o. weekly.  First dose 09/26/2022.  Status post 1 cycle and currently receiving day 15 of cycle #2.  INTERVAL HISTORY: Brenda Conner 62 y.o. female returns to the clinic today for follow-up visit.Discussed the use of AI scribe software for clinical note transcription with the patient, who gave verbal consent to proceed.  History of Present Illness   The patient, a 62 year old with a history of MGUS progressing to multiple myeloma, has been undergoing treatment with a three-drug combination of daratumumab, Pomalyst, and Decadron. She reports feeling generally tired, which she attributes to the treatment. She also experiences sleep disturbances due to Decadron, describing periods of insomnia followed by extreme fatigue.  In addition to  these symptoms, the patient has noticed increased breathlessness during physical activity, which she suspects may be due to low hemoglobin or red blood cell count. She has been managing this by slowing down her pace during activities.  The patient is currently in the fifteenth day of her treatment cycle and has one week left of taking Pomalyst. She has been informed that her blood count is on the lower side, but is continuing with the treatment for now.       MEDICAL HISTORY: Past Medical History:  Diagnosis Date   Acid reflux    Anemia    Anxiety    Asthma    Cancer (HCC)    waldenstroms/ macroglobinulemia   Depression    Depression    Diabetes mellitus without complication (HCC)    Dysuria 02/28/2016   Gallstones    Heart palpitations    Hypercholesteremia    Hypertension    Hypothyroidism    Macroglobulinemia    ? POEMS syndrome   Monoclonal gammopathy of unknown significance (MGUS)    Multiple myeloma (HCC)    Obesity    POEMS syndrome    PONV (postoperative nausea and vomiting)    Sleep apnea    CPAP at bedtime    ALLERGIES:  is allergic to codeine, hydrocodone, lortab [hydrocodone-acetaminophen], onion, shellfish allergy, and amoxicillin.  MEDICATIONS:  Current Outpatient Medications  Medication Sig Dispense Refill   ACCU-CHEK AVIVA PLUS test strip USE TO CHECK SUGAR TWICE A DAY 90     acyclovir (ZOVIRAX) 400 MG tablet Take 1 tablet (400 mg total) by mouth  2 (two) times daily. 60 tablet 11   ALPRAZolam (XANAX) 0.5 MG tablet Take 1 tablet (0.5 mg total) by mouth at bedtime as needed for anxiety. 30 tablet 0   aspirin 81 MG chewable tablet Chew by mouth daily.     Azelastine HCl 0.15 % SOLN as needed.     B-D UF III MINI PEN NEEDLES 31G X 5 MM MISC      Blood Glucose Monitoring Suppl (ONE TOUCH ULTRA MINI) W/DEVICE KIT See admin instructions. Reported on 01/25/2015  0   cetirizine (ZYRTEC ALLERGY) 10 MG tablet Take 1 tablet by mouth daily.     dexamethasone  (DECADRON) 4 MG tablet Take 5 tablets (20 mg total) by mouth once a week. Take the day after darzalex faspro. Take with breakfast. 20 tablet 11   DiphenhydrAMINE HCl (BENADRYL ALLERGY PO) Take 1 Dose by mouth daily at 6 (six) AM.     fexofenadine (ALLEGRA ALLERGY) 180 MG tablet Take 1 tablet by mouth daily.     Fluticasone-Salmeterol (ADVAIR) 100-50 MCG/DOSE AEPB Inhale 2 puffs into the lungs every 12 (twelve) hours.     glucosamine-chondroitin 500-400 MG tablet Take 1 tablet by mouth daily.     ibuprofen (ADVIL,MOTRIN) 100 MG tablet Take 100 mg by mouth every 6 (six) hours as needed. Reported on 05/31/2015     Insulin Glargine (BASAGLAR KWIKPEN) 100 UNIT/ML Inject 20 Units into the skin at bedtime.     levothyroxine (SYNTHROID) 137 MCG tablet Take 137 mcg by mouth daily.     lisinopril (PRINIVIL,ZESTRIL) 10 MG tablet Take 10 mg by mouth daily. auth number 05/29/2018 [id number]  3   loperamide (IMODIUM) 2 MG capsule Take 2 mg by mouth as needed for diarrhea or loose stools.     magic mouthwash w/lidocaine SOLN Take 5 mLs by mouth 4 (four) times daily as needed for mouth pain. Swish, Gargle, and spit 240 mL 1   Melatonin 5 MG CAPS Take 1 capsule by mouth at bedtime.     metFORMIN (GLUCOPHAGE-XR) 500 MG 24 hr tablet Take 500 mg by mouth daily.     NOVOLOG FLEXPEN 100 UNIT/ML FlexPen Inject 15 Units into the skin in the morning, at noon, and at bedtime. Per Sliding Scale     omeprazole (PRILOSEC) 20 MG capsule Take 20 mg by mouth 2 (two) times daily.     ondansetron (ZOFRAN) 8 MG tablet Take 1 tablet (8 mg total) by mouth every 8 (eight) hours as needed for nausea or vomiting. 30 tablet 1   pomalidomide (POMALYST) 3 MG capsule Take 1 capsule (3 mg total) by mouth daily. for 21 days and off for 7 days.[name] # [id number]     Date Obtained 11/02/2022 Adult female not of childbearing potential 21 capsule 0   pravastatin (PRAVACHOL) 80 MG tablet Take 80 mg by mouth daily.     PROAIR HFA 108 (90 Base)  MCG/ACT inhaler Reported on 06/21/2015  1   prochlorperazine (COMPAZINE) 10 MG tablet Take 1 tablet (10 mg total) by mouth every 6 (six) hours as needed for nausea or vomiting. 30 tablet 1   sertraline (ZOLOFT) 50 MG tablet TAKE 3 TABLETS BY MOUTH EVERY DAY 270 tablet 1   verapamil (CALAN-SR) 240 MG CR tablet Take 240 mg by mouth 2 (two) times daily.     No current facility-administered medications for this visit.    SURGICAL HISTORY:  Past Surgical History:  Procedure Laterality Date   ABLATION  09/09/2007   HTA  and polyp resection   BONE MARROW BIOPSY  04/08/2012   BONE MARROW BIOPSY  01/2020   CATARACT EXTRACTION, BILATERAL Bilateral 02/2022   FOOT SURGERY Bilateral 01/09/1996   small toe   KNEE ARTHROSCOPY W/ MENISCAL REPAIR Bilateral 10/10 ; 3/11   LAPAROSCOPIC CHOLECYSTECTOMY  01/09/1995   LUMBAR DISC SURGERY  03/08/2004   herniation, L4-L5   NASAL SINUS SURGERY  01/08/2002   UTERINE FIBROID EMBOLIZATION  2009   HTA and polyp resection    REVIEW OF SYSTEMS:  A comprehensive review of systems was negative except for: Constitutional: positive for fatigue   PHYSICAL EXAMINATION: General appearance: alert, cooperative, fatigued, and no distress Head: Normocephalic, without obvious abnormality, atraumatic Neck: no adenopathy Lymph nodes: Cervical, supraclavicular, and axillary nodes normal. Resp: clear to auscultation bilaterally Back: symmetric, no curvature. ROM normal. No CVA tenderness. Cardio: regular rate and rhythm, S1, S2 normal, no murmur, click, rub or gallop GI: soft, non-tender; bowel sounds normal; no masses,  no organomegaly Extremities: extremities normal, atraumatic, no cyanosis or edema  ECOG PERFORMANCE STATUS: 1 - Symptomatic but completely ambulatory  Blood pressure (!) 146/57, pulse 69, temperature 98.1 F (36.7 C), temperature source Oral, resp. rate 16, height 5\' 7"  (1.702 m), weight 265 lb 8 oz (120.4 kg), SpO2 100%.  LABORATORY DATA: Lab Results   Component Value Date   WBC 2.1 (L) 11/15/2022   HGB 9.0 (L) 11/15/2022   HCT 28.9 (L) 11/15/2022   MCV 86.5 11/15/2022   PLT 85 (L) 11/15/2022      Chemistry      Component Value Date/Time   NA 137 11/15/2022 0823   NA 138 11/14/2016 0824   K 4.5 11/15/2022 0823   K 4.3 11/14/2016 0824   CL 103 11/15/2022 0823   CL 104 04/07/2012 1415   CO2 26 11/15/2022 0823   CO2 23 11/14/2016 0824   BUN 8 11/15/2022 0823   BUN 9.1 11/14/2016 0824   CREATININE 0.74 11/15/2022 0823   CREATININE 0.7 11/14/2016 0824      Component Value Date/Time   CALCIUM 9.1 11/15/2022 0823   CALCIUM 9.6 11/14/2016 0824   ALKPHOS 123 11/15/2022 0823   ALKPHOS 94 11/14/2016 0824   AST 27 11/15/2022 0823   AST 28 11/14/2016 0824   ALT 26 11/15/2022 0823   ALT 24 11/14/2016 0824   BILITOT 0.7 11/15/2022 0823   BILITOT 0.41 11/14/2016 0824      ASSESSMENT AND PLAN:  This is a very pleasant 63 years old white female with multiple myeloma with questionable POEMS syndrome symptom.  The patient has been on treatment with subcutaneous weekly Velcade as well as Revlimid and Decadron status post 34 cycles.  She has been tolerating this treatment well. The patient has been on observation for the last several months and she has been doing fine with no concerning issues except for the recent upper respiratory infection and viral gastroenteritis. Her myeloma panel showed continuous increase in her free lambda light chain. The patient had a bone marrow biopsy and aspirate recently that showed 3-5% plasma cells still suspicious for plasma cell dyscrasia.  The skeletal bone survey was negative for any lytic lesions. The patient has been off treatment for more than a year between June 2021 until July 2022. She had repeat myeloma panel that showed significant worsening and increase of her lambda light chain.  She continues to have anemia but no renal insufficiency or hypercalcemia. She resumed her treatment with Velcade,  Revlimid and Decadron on  July 19, 2020.   The patient is currently on observation and she is feeling fine with no concerning complaints. She was seen recently by Dr. Marissa Calamity at [facility] for second opinion and after extensive investigation is recommended for her to resume her treatment for the multiple myeloma with daratumumab, Pomalyst 3 Mg p.o. daily for 21 days every 4 weeks and Decadron 20 mg weekly.  She is status post 1 cycle.  She is here today for day 15 of cycle #2.    Multiple Myeloma Currently on cycle 1, day 15 of Daratumumab, Pomalyst, and Decadron. Reports fatigue and insomnia related to Decadron. Hemoglobin 9.0, hematocrit 28.9, WBC 2.1, ANC 1000, platelets 85,000. -Continue current treatment regimen. -Advise patient to report any signs of infection immediately due to low WBC and ANC. -Consider holding treatment if counts decrease further at next visit. -Plan to reassess in two weeks and make treatment decisions based on lab results.   The patient was advised to call immediately if she has any concerning symptoms in the interval.  All questions were answered. The patient knows to call the clinic with any problems, questions or concerns. We can certainly see the patient much sooner if necessary.  Disclaimer: This note was dictated with voice recognition software. Similar sounding words can inadvertently be transcribed and may not be corrected upon review.

## 2022-11-15 NOTE — Patient Instructions (Signed)

## 2022-11-16 ENCOUNTER — Other Ambulatory Visit: Payer: Self-pay | Admitting: Medical Oncology

## 2022-11-21 ENCOUNTER — Ambulatory Visit (HOSPITAL_BASED_OUTPATIENT_CLINIC_OR_DEPARTMENT_OTHER): Payer: 59

## 2022-11-21 ENCOUNTER — Inpatient Hospital Stay: Payer: 59

## 2022-11-21 ENCOUNTER — Telehealth: Payer: Self-pay | Admitting: Internal Medicine

## 2022-11-21 ENCOUNTER — Encounter (HOSPITAL_BASED_OUTPATIENT_CLINIC_OR_DEPARTMENT_OTHER): Payer: Self-pay | Admitting: Obstetrics & Gynecology

## 2022-11-21 ENCOUNTER — Other Ambulatory Visit: Payer: Self-pay

## 2022-11-21 ENCOUNTER — Other Ambulatory Visit: Payer: Self-pay | Admitting: Physician Assistant

## 2022-11-21 ENCOUNTER — Ambulatory Visit (HOSPITAL_BASED_OUTPATIENT_CLINIC_OR_DEPARTMENT_OTHER): Payer: 59 | Admitting: Obstetrics & Gynecology

## 2022-11-21 ENCOUNTER — Encounter: Payer: Self-pay | Admitting: Internal Medicine

## 2022-11-21 VITALS — BP 144/67 | HR 66 | Ht 67.0 in | Wt 267.4 lb

## 2022-11-21 VITALS — BP 142/55 | HR 73 | Temp 98.6°F | Resp 18 | Wt 267.5 lb

## 2022-11-21 DIAGNOSIS — Z5112 Encounter for antineoplastic immunotherapy: Secondary | ICD-10-CM | POA: Diagnosis not present

## 2022-11-21 DIAGNOSIS — N83201 Unspecified ovarian cyst, right side: Secondary | ICD-10-CM | POA: Diagnosis not present

## 2022-11-21 DIAGNOSIS — D4959 Neoplasm of unspecified behavior of other genitourinary organ: Secondary | ICD-10-CM | POA: Diagnosis not present

## 2022-11-21 DIAGNOSIS — C9000 Multiple myeloma not having achieved remission: Secondary | ICD-10-CM

## 2022-11-21 DIAGNOSIS — T451X5A Adverse effect of antineoplastic and immunosuppressive drugs, initial encounter: Secondary | ICD-10-CM

## 2022-11-21 LAB — CBC WITH DIFFERENTIAL (CANCER CENTER ONLY)
Abs Immature Granulocytes: 0 10*3/uL (ref 0.00–0.07)
Basophils Absolute: 0 10*3/uL (ref 0.0–0.1)
Basophils Relative: 0 %
Eosinophils Absolute: 0 10*3/uL (ref 0.0–0.5)
Eosinophils Relative: 3 %
HCT: 26 % — ABNORMAL LOW (ref 36.0–46.0)
Hemoglobin: 8.5 g/dL — ABNORMAL LOW (ref 12.0–15.0)
Lymphocytes Relative: 56 %
Lymphs Abs: 0.6 10*3/uL — ABNORMAL LOW (ref 0.7–4.0)
MCH: 27.3 pg (ref 26.0–34.0)
MCHC: 32.7 g/dL (ref 30.0–36.0)
MCV: 83.6 fL (ref 80.0–100.0)
Monocytes Absolute: 0.1 10*3/uL (ref 0.1–1.0)
Monocytes Relative: 10 %
Neutro Abs: 0.3 10*3/uL — CL (ref 1.7–7.7)
Neutrophils Relative %: 31 %
Platelet Count: 83 10*3/uL — ABNORMAL LOW (ref 150–400)
RBC: 3.11 MIL/uL — ABNORMAL LOW (ref 3.87–5.11)
RDW: 18.6 % — ABNORMAL HIGH (ref 11.5–15.5)
Smear Review: NORMAL
WBC Count: 1.1 10*3/uL — ABNORMAL LOW (ref 4.0–10.5)
nRBC: 0 % (ref 0.0–0.2)

## 2022-11-21 LAB — CMP (CANCER CENTER ONLY)
ALT: 22 U/L (ref 0–44)
AST: 20 U/L (ref 15–41)
Albumin: 3.7 g/dL (ref 3.5–5.0)
Alkaline Phosphatase: 117 U/L (ref 38–126)
Anion gap: 7 (ref 5–15)
BUN: 10 mg/dL (ref 8–23)
CO2: 26 mmol/L (ref 22–32)
Calcium: 8.5 mg/dL — ABNORMAL LOW (ref 8.9–10.3)
Chloride: 101 mmol/L (ref 98–111)
Creatinine: 0.77 mg/dL (ref 0.44–1.00)
GFR, Estimated: >60 mL/min (ref >60)
Glucose, Bld: 256 mg/dL — ABNORMAL HIGH (ref 70–99)
Potassium: 3.7 mmol/L (ref 3.5–5.1)
Sodium: 134 mmol/L — ABNORMAL LOW (ref 135–145)
Total Bilirubin: 0.7 mg/dL (ref <1.2)
Total Protein: 6.4 g/dL — ABNORMAL LOW (ref 6.5–8.1)

## 2022-11-21 MED ORDER — FILGRASTIM-SNDZ 480 MCG/0.8ML IJ SOSY
480.0000 ug | PREFILLED_SYRINGE | Freq: Once | INTRAMUSCULAR | Status: AC
  Administered 2022-11-21: 480 ug via SUBCUTANEOUS
  Filled 2022-11-21: qty 0.8

## 2022-11-21 NOTE — Progress Notes (Signed)
CRITICAL VALUE STICKER  CRITICAL VALUE: ANC 0.3  RECEIVER (on-site recipient of call): Morrie Sheldon   DATE & TIME NOTIFIED: 11/13 @ 1415  MESSENGER (representative from lab): Shanda Bumps   MD NOTIFIED: Arbutus Ped   TIME OF NOTIFICATION: 1420  RESPONSE:  Information acknowledge

## 2022-11-21 NOTE — Patient Instructions (Addendum)
Neutropenia Neutropenia is a condition that occurs when you have low levels of neutrophils. Neutrophils are a type of white blood cells. They are made in the spongy center of bones (bone marrow). They fight infections. Neutrophils are your body's main defense against infections. The fewer neutrophils you have and the longer your body remains without them, the greater your risk of getting a severe infection. What are the causes? This condition can occur if your body uses up or destroys neutrophils faster than your bone marrow can make them. Neutropenia may be caused by: A bacterial or fungal infection. Allergic disorders. Reactions to some medicines. An autoimmune disease. An enlarged spleen. This condition can also occur if your bone marrow does not produce enough neutrophils. This problem may be caused by: Cancer. Cancer treatments, such as radiation or chemotherapy. Viral infections. Medicines, such as phenytoin. Vitamin B12 deficiency. Diseases of the bone marrow. Environmental toxins, such as insecticides. What are the signs or symptoms? This condition does not usually cause symptoms. If symptoms are present, they are usually caused by an underlying infection. Symptoms of an infection may include: Fever. Chills. Swollen glands. Mouth ulcers. Cough. Rash or skin infection. Skin may be red, swollen, or painful. Abdominal or rectal pain. Frequent urination or pain or burning with urination. Because neutropenia weakens the immune system, symptoms of infection may be reduced. It is important to be aware of any changes in your body and talk to your health care provider. How is this diagnosed? This condition is diagnosed based on your medical history and a physical exam. Tests will also be done, such as: A complete blood count (CBC). Bone marrow biopsy. This is collecting a sample of bone marrow for testing. A chest X-ray. A urine culture. A blood culture. How is this  treated? Treatment depends on the underlying cause and severity of your condition. Mild neutropenia may not require treatment. Treatment may include medicines, such as: Antibiotic medicine given through an IV. Antiviral medicines. Antifungal medicines. A medicine to increase production of neutrophils (colony-stimulating factor). You may get this medicine through an IV or by injection. Steroids given through an IV. If an underlying condition is causing neutropenia, you may need treatment for that condition. If medicines or cancer treatments are causing neutropenia, your health care provider may have you stop the medicines or treatment. Follow these instructions at home: Medicines  Take over-the-counter and prescription medicines only as told by your health care provider. Get an annual flu shot. Ask your health care provider whether you or anyone you live with needs any other vaccines. Eating and drinking Do not share food utensils. Do not eat unpasteurized foods. Do not eat raw or undercooked meat, eggs, or seafood. Do not eat unwashed, raw fruits or vegetables. Lifestyle Avoid exposure to groups of people or children. Avoid being around people who are sick. Avoid being around live plants or fresh flowers. Avoid being around dirt or dust, such as in construction areas or gardens. Wear gloves if you are going to do yard work or gardening. Do not provide direct care for pets. Avoid animal droppings. Do not clean litter boxes and bird cages. Do not have sex unless your health care provider has approved. Hygiene  Bathe daily. Clean the area between the genitals and the anus (perineal area) after you urinate or have a bowel movement. If you are female, wipe from front to back. Get regular dental care and brush your teeth with a soft toothbrush before and after meals. Do not use  a regular razor. Use an electric razor to remove hair. Wash your hands often with soap and water for at least 20  seconds. Make sure others who come in contact with you also wash their hands. If soap and water are not available, use hand sanitizer. General instructions Take steps to reduce your risk of injury or infection. Follow any precautions as told by your health care provider. Take actions to avoid cuts and burns. For example: Be cautious when you use knives. Always cut away from yourself. Keep knives in protective sheaths or guards when not in use. Use oven mitts when you cook with a hot stove, oven, or grill. Stand a safe distance away from open fires. Do not use tampons, enemas, or rectal suppositories unless your health care provider has approved. Keep all follow-up visits. This is important. Contact a health care provider if: You have a cough. You have a sore throat. You develop sores in your mouth or anus. You have a warm, red, or tender area on your skin. You have red streaks on the skin. You develop a rash. You have swollen lymph nodes. You have frequent or painful urination. You have vaginal discharge or itching. Get help right away if: You have a fever. You have chills or shaking. You have nausea or vomiting. You have a lot of fatigue. You have shortness of breath. Summary Neutropenia is a condition that occurs when you have a lower-than-normal level of a type of white blood cell (neutrophils) in your body. This condition can occur if your body uses up or destroys neutrophils faster than your bone marrow can make them. Treatment depends on the underlying cause and severity of your condition. Mild neutropenia may not require treatment. Follow any precautions as told by your health care provider to reduce your risk for injury or infection. This information is not intended to replace advice given to you by your health care provider. Make sure you discuss any questions you have with your health care provider. Document Revised: 06/22/2020 Document Reviewed: 06/22/2020 Elsevier Patient  Education  2024 Elsevier Inc.  Filgrastim Injection What is this medication? FILGRASTIM (fil GRA stim) lowers the risk of infection in people who are receiving chemotherapy. It works by Systems analyst make more white blood cells, which protects your body from infection. It may also be used to help people who have been exposed to high doses of radiation. It can be used to help prepare your body before a stem cell transplant. It works by helping your bone marrow make and release stem cells into the blood. This medicine may be used for other purposes; ask your health care provider or pharmacist if you have questions. COMMON BRAND NAME(S): Neupogen, Nivestym, Releuko, Zarxio What should I tell my care team before I take this medication? They need to know if you have any of these conditions: History of blood diseases, such as sickle cell anemia Kidney disease Recent or ongoing radiation An unusual or allergic reaction to filgrastim, pegfilgrastim, latex, rubber, other medications, foods, dyes, or preservatives Pregnant or trying to get pregnant Breast-feeding How should I use this medication? This medication is injected under the skin or into a vein. It is usually given by your care team in a hospital or clinic setting. It may be given at home. If you get this medication at home, you will be taught how to prepare and give it. Use exactly as directed. Take it as directed on the prescription label at the same time every  day. Keep taking it unless your care team tells you to stop. It is important that you put your used needles and syringes in a special sharps container. Do not put them in a trash can. If you do not have a sharps container, call your pharmacist or care team to get one. This medication comes with INSTRUCTIONS FOR USE. Ask your pharmacist for directions on how to use this medication. Read the information carefully. Talk to your pharmacist or care team if you have questions. Talk to your  care team about the use of this medication in children. While it may be prescribed for children for selected conditions, precautions do apply. Overdosage: If you think you have taken too much of this medicine contact a poison control center or emergency room at once. NOTE: This medicine is only for you. Do not share this medicine with others. What if I miss a dose? It is important not to miss any doses. Talk to your care team about what to do if you miss a dose. What may interact with this medication? Medications that may cause a release of neutrophils, such as lithium This list may not describe all possible interactions. Give your health care provider a list of all the medicines, herbs, non-prescription drugs, or dietary supplements you use. Also tell them if you smoke, drink alcohol, or use illegal drugs. Some items may interact with your medicine. What should I watch for while using this medication? Your condition will be monitored carefully while you are receiving this medication. You may need bloodwork while taking this medication. Talk to your care team about your risk of cancer. You may be more at risk for certain types of cancer if you take this medication. What side effects may I notice from receiving this medication? Side effects that you should report to your care team as soon as possible: Allergic reactions--skin rash, itching, hives, swelling of the face, lips, tongue, or throat Capillary leak syndrome--stomach or muscle pain, unusual weakness or fatigue, feeling faint or lightheaded, decrease in the amount of urine, swelling of the ankles, hands, or feet, trouble breathing High white blood cell level--fever, fatigue, trouble breathing, night sweats, change in vision, weight loss Inflammation of the aorta--fever, fatigue, back, chest, or stomach pain, severe headache Kidney injury (glomerulonephritis)--decrease in the amount of urine, red or dark brown urine, foamy or bubbly urine,  swelling of the ankles, hands, or feet Shortness of breath or trouble breathing Spleen injury--pain in upper left stomach or shoulder Unusual bruising or bleeding Side effects that usually do not require medical attention (report to your care team if they continue or are bothersome): Back pain Bone pain Fatigue Fever Headache Nausea This list may not describe all possible side effects. Call your doctor for medical advice about side effects. You may report side effects to FDA at 1-800-FDA-1088. Where should I keep my medication? Keep out of the reach of children and pets. Keep this medication in the original packaging until you are ready to take it. Protect from light. See product for storage information. Each product may have different instructions. Get rid of any unused medication after the expiration date. To get rid of medications that are no longer needed or have expired: Take the medication to a medications take-back program. Check with your pharmacy or law enforcement to find a location. If you cannot return the medication, ask your pharmacist or care team how to get rid of this medication safely. NOTE: This sheet is a summary. It may not  cover all possible information. If you have questions about this medicine, talk to your doctor, pharmacist, or health care provider.  2024 Elsevier/Gold Standard (2021-05-18 00:00:00)

## 2022-11-21 NOTE — Progress Notes (Signed)
Per Dr. Arbutus Ped- no treatment today.  ANC 0.3.  Zarxio injection today, tomorrow and Friday. Schedule message sent.

## 2022-11-22 ENCOUNTER — Inpatient Hospital Stay: Payer: 59

## 2022-11-22 VITALS — BP 134/53 | HR 64 | Temp 98.1°F | Resp 17

## 2022-11-22 DIAGNOSIS — T451X5A Adverse effect of antineoplastic and immunosuppressive drugs, initial encounter: Secondary | ICD-10-CM

## 2022-11-22 DIAGNOSIS — Z5112 Encounter for antineoplastic immunotherapy: Secondary | ICD-10-CM | POA: Diagnosis not present

## 2022-11-22 LAB — CA 125: Cancer Antigen (CA) 125: 15.4 U/mL (ref 0.0–38.1)

## 2022-11-22 MED ORDER — FILGRASTIM-SNDZ 480 MCG/0.8ML IJ SOSY
480.0000 ug | PREFILLED_SYRINGE | Freq: Once | INTRAMUSCULAR | Status: AC
  Administered 2022-11-22: 480 ug via SUBCUTANEOUS
  Filled 2022-11-22: qty 0.8

## 2022-11-22 NOTE — Progress Notes (Signed)
[facility] OFFICE PROGRESS NOTE  Brenda Rocks, MD [address] [place] [address]  DIAGNOSIS: Plasma cell dyscrasia initially diagnosed as MGUS in September 2010.   PRIOR THERAPY: 1) Velcade 1.3 MG/M2 subcutaneously with Decadron 40 mg by mouth on a weekly basis. First cycle 11/24/2013. She status post 31 weekly doses of treatment. 2) Velcade 1.3 MG/M2 subcutaneously and weekly basis with Decadron 40 mg by mouth weekly. First dose 02/01/2015. Status post 28 cycles. 3) Revlimid 25 mg by mouth daily for 21 days every 4 weeks with weekly Decadron 20 mg. started in 11/27/2015. Status post 3 cycles discontinued secondary to lack of response. 4) Systemic treatment with Velcade 1.3 MG/KG weekly, Revlimid 25 mg by mouth daily for 21 days every 4 weeks in addition to Decadron 20 mg by mouth weekly. First dose 03/06/2016. Status post 42 cycles.  She has a break off treatment from June 2021 until July 2022.  CURRENT THERAPY: Second line treatment with daratumumab, Pomalyst 3 mg for 21 days every 4 weeks as well as Decadron 20 mg p.o. weekly.  First dose 09/26/2022.  Status post day 15 cycle #2. Reducing dose of Pomalyst to 2 mg starting with the next cycle of treatment in December 2024  INTERVAL HISTORY: Brenda Conner 62 y.o. female returns to the clnic for a follow up visit. She was seen by Dr. Arbutus Ped on 09/19/22. At that point in time, she had repeat labs that showed concern for progression. Dr. Marissa Calamity for second opinion at [facility].  He recommended for the patient to start second line treatment with daratumumab Pomalyst and Decadron.     She started treatment on 09/26/26. She saw Dr. Arbutus Ped on 10/04/22. She tolerated treatment fairly well except for fatigue. Last week, she had neutropenia that required GCSF and her treatment was canceled.  She is scheduled to start Pomalyst back today.  She has some questions about instructions when she has neutropenia.  The  patient is feeling fairly well today except she states that she is prone to diarrhea.  Yesterday she had 6-7 loose bowel movements.  She did not take Imodium.  She also had a lower left quadrant pain associated with diarrhea.  Denies any fever or blood in the stool.  She is having some bloating.  Her last loose bowel movement was last night.  The patient's last colonoscopy was in August 2023.  The patient states that she does not have any diverticulosis.  She denies any fever, chills, or unexplained weight loss. Denies any signs or symptoms of infection including sore throat, nasal congestion, cough, or skin infections at this time.  Denies any nausea, vomiting. Denies any abnormal bleeding or bruising. She is here for evaluation and repeat blood work before undergoing day 1 cycle #3.     MEDICAL HISTORY: Past Medical History:  Diagnosis Date   Acid reflux    Anemia    Anxiety    Asthma    Cancer (HCC)    waldenstroms/ macroglobinulemia   Depression    Depression    Diabetes mellitus without complication (HCC)    Dysuria 02/28/2016   Gallstones    Heart palpitations    Hypercholesteremia    Hypertension    Hypothyroidism    Macroglobulinemia    ? POEMS syndrome   Monoclonal gammopathy of unknown significance (MGUS)    Multiple myeloma (HCC)    Obesity    POEMS syndrome    PONV (postoperative nausea and  vomiting)    Sleep apnea    CPAP at bedtime    ALLERGIES:  is allergic to codeine, hydrocodone, lortab [hydrocodone-acetaminophen], onion, shellfish allergy, and amoxicillin.  MEDICATIONS:  Current Outpatient Medications  Medication Sig Dispense Refill   ACCU-CHEK AVIVA PLUS test strip USE TO CHECK SUGAR TWICE A DAY 90     acyclovir (ZOVIRAX) 400 MG tablet Take 1 tablet (400 mg total) by mouth 2 (two) times daily. 60 tablet 11   ALPRAZolam (XANAX) 0.5 MG tablet Take 1 tablet (0.5 mg total) by mouth at bedtime as needed for anxiety. 30 tablet 0   aspirin 81 MG chewable tablet  Chew by mouth daily.     Azelastine HCl 0.15 % SOLN as needed.     B-D UF III MINI PEN NEEDLES 31G X 5 MM MISC      Blood Glucose Monitoring Suppl (ONE TOUCH ULTRA MINI) W/DEVICE KIT See admin instructions. Reported on 01/25/2015  0   cetirizine (ZYRTEC ALLERGY) 10 MG tablet Take 1 tablet by mouth daily.     dexamethasone (DECADRON) 4 MG tablet Take 5 tablets (20 mg total) by mouth once a week. Take the day after darzalex faspro. Take with breakfast. 20 tablet 11   DiphenhydrAMINE HCl (BENADRYL ALLERGY PO) Take 1 Dose by mouth daily at 6 (six) AM.     fexofenadine (ALLEGRA ALLERGY) 180 MG tablet Take 1 tablet by mouth daily.     Fluticasone-Salmeterol (ADVAIR) 100-50 MCG/DOSE AEPB Inhale 2 puffs into the lungs every 12 (twelve) hours.     glucosamine-chondroitin 500-400 MG tablet Take 1 tablet by mouth daily.     ibuprofen (ADVIL,MOTRIN) 100 MG tablet Take 100 mg by mouth every 6 (six) hours as needed. Reported on 05/31/2015     Insulin Glargine (BASAGLAR KWIKPEN) 100 UNIT/ML Inject 20 Units into the skin at bedtime.     levothyroxine (SYNTHROID) 137 MCG tablet Take 137 mcg by mouth daily.     lisinopril (PRINIVIL,ZESTRIL) 10 MG tablet Take 10 mg by mouth daily. auth number 05/29/2018 [id number]  3   loperamide (IMODIUM) 2 MG capsule Take 2 mg by mouth as needed for diarrhea or loose stools.     magic mouthwash w/lidocaine SOLN Take 5 mLs by mouth 4 (four) times daily as needed for mouth pain. Swish, Gargle, and spit 240 mL 1   Melatonin 5 MG CAPS Take 1 capsule by mouth at bedtime.     metFORMIN (GLUCOPHAGE-XR) 500 MG 24 hr tablet Take 500 mg by mouth daily.     NOVOLOG FLEXPEN 100 UNIT/ML FlexPen Inject 15 Units into the skin in the morning, at noon, and at bedtime. Per Sliding Scale     omeprazole (PRILOSEC) 20 MG capsule Take 20 mg by mouth 2 (two) times daily.     ondansetron (ZOFRAN) 8 MG tablet Take 1 tablet (8 mg total) by mouth every 8 (eight) hours as needed for nausea or vomiting. 30 tablet  1   pomalidomide (POMALYST) 3 MG capsule Take 1 capsule (3 mg total) by mouth daily. for 21 days and off for 7 days.[name] # [id number]     Date Obtained 11/02/2022 Adult female not of childbearing potential 21 capsule 0   pravastatin (PRAVACHOL) 80 MG tablet Take 80 mg by mouth daily.     PROAIR HFA 108 (90 Base) MCG/ACT inhaler Reported on 06/21/2015  1   prochlorperazine (COMPAZINE) 10 MG tablet Take 1 tablet (10 mg total) by mouth every 6 (six) hours as needed for  nausea or vomiting. 30 tablet 1   sertraline (ZOLOFT) 50 MG tablet TAKE 3 TABLETS BY MOUTH EVERY DAY 270 tablet 1   verapamil (CALAN-SR) 240 MG CR tablet Take 240 mg by mouth 2 (two) times daily.     No current facility-administered medications for this visit.    SURGICAL HISTORY:  Past Surgical History:  Procedure Laterality Date   ABLATION  09/09/2007   HTA and polyp resection   BONE MARROW BIOPSY  04/08/2012   BONE MARROW BIOPSY  01/2020   CATARACT EXTRACTION, BILATERAL Bilateral 02/2022   FOOT SURGERY Bilateral 01/09/1996   small toe   KNEE ARTHROSCOPY W/ MENISCAL REPAIR Bilateral 10/10 ; 3/11   LAPAROSCOPIC CHOLECYSTECTOMY  01/09/1995   LUMBAR DISC SURGERY  03/08/2004   herniation, L4-L5   NASAL SINUS SURGERY  01/08/2002   UTERINE FIBROID EMBOLIZATION  2009   HTA and polyp resection    REVIEW OF SYSTEMS:   Review of Systems  Constitutional: Positive for fatigue. Negative for appetite change, chills, fever and unexpected weight change.  HENT: Negative for mouth sores, nosebleeds, sore throat and trouble swallowing.   Eyes: Negative for eye problems and icterus.  Respiratory: Negative for cough, hemoptysis, shortness of breath and wheezing.   Cardiovascular: Negative for chest pain and leg swelling.  Gastrointestinal: Positive for diarrhea yesterday and bloating. Negative for  constipation,  nausea and vomiting.  Genitourinary: Negative for bladder incontinence, difficulty urinating, dysuria, frequency and  hematuria.   Musculoskeletal: Negative for back pain, gait problem, neck pain and neck stiffness.  Skin: Negative for itching and rash.  Neurological: Negative for dizziness, extremity weakness, gait problem, headaches, light-headedness and seizures.  Hematological: Negative for adenopathy. Does not bruise/bleed easily.  Psychiatric/Behavioral: Negative for confusion, depression and sleep disturbance. The patient is not nervous/anxious.    PHYSICAL EXAMINATION:  There were no vitals taken for this visit.  ECOG PERFORMANCE STATUS: 1  Physical Exam  Constitutional: Oriented to person, place, and time and well-developed, well-nourished, and in no distress.  HENT:  Head: Normocephalic and atraumatic.  Mouth/Throat: Oropharynx is clear and moist. No oropharyngeal exudate.  Eyes: Conjunctivae are normal. Right eye exhibits no discharge. Left eye exhibits no discharge. No scleral icterus.  Neck: Normal range of motion. Neck supple.  Cardiovascular: Normal rate, regular rhythm, normal heart sounds and intact distal pulses.   Pulmonary/Chest: Effort normal and breath sounds normal. No respiratory distress. No wheezes. No rales.  Abdominal: Soft. Bowel sounds are normal. Exhibits no distension and no mass. There is no tenderness to light or deep palpation. No rebound tenderness.   Musculoskeletal: Normal range of motion. Exhibits no edema.  Lymphadenopathy:    No cervical adenopathy.  Neurological: Alert and oriented to person, place, and time. Exhibits normal muscle tone. Gait normal. Coordination normal.  Skin: Skin is warm and dry. No rash noted. Not diaphoretic. No erythema. No pallor.  Psychiatric: Mood, memory and judgment normal.  Vitals reviewed.  LABORATORY DATA: Lab Results  Component Value Date   WBC 1.1 (L) 11/21/2022   HGB 8.5 (L) 11/21/2022   HCT 26.0 (L) 11/21/2022   MCV 83.6 11/21/2022   PLT 83 (L) 11/21/2022      Chemistry      Component Value Date/Time   NA 134 (L)  11/21/2022 1313   NA 138 11/14/2016 0824   K 3.7 11/21/2022 1313   K 4.3 11/14/2016 0824   CL 101 11/21/2022 1313   CL 104 04/07/2012 1415   CO2 26 11/21/2022 1313  CO2 23 11/14/2016 0824   BUN 10 11/21/2022 1313   BUN 9.1 11/14/2016 0824   CREATININE 0.77 11/21/2022 1313   CREATININE 0.7 11/14/2016 0824      Component Value Date/Time   CALCIUM 8.5 (L) 11/21/2022 1313   CALCIUM 9.6 11/14/2016 0824   ALKPHOS 117 11/21/2022 1313   ALKPHOS 94 11/14/2016 0824   AST 20 11/21/2022 1313   AST 28 11/14/2016 0824   ALT 22 11/21/2022 1313   ALT 24 11/14/2016 0824   BILITOT 0.7 11/21/2022 1313   BILITOT 0.41 11/14/2016 0824       RADIOGRAPHIC STUDIES:  No results found.   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 Her myeloma panel showed continuous increase in her free lambda light chain.    The patient previously underwent 107 cycles of subcutaneous weekly Velcade, as well as Revlimid and Decadron on and off over the last several years.   She had been off treatment since June 2021 until July 2022 in which she had evidence of disease progression.  Therefore, she was restarted on Velcade, Revlimid, and Decadron.  She has restarted treatment in July 2022 with cycle 107 of weekly velcade. She tolerated well without any concerning adverse side effects.  She was then given a break.   She was seen recently by Dr. Marissa Calamity at [facility] for second opinion and after extensive investigation is recommended for her to resume her treatment for the multiple myeloma with daratumumab, Pomalyst 3 Mg p.o. daily for 21 days every 4 weeks and Decadron 20 mg weekly. This was started in September 2024. She is tolerating it fair except today she has some progressive cytopenias.   I reviewed the patient's recent lab studies with Dr. Arbutus Ped.  She has had some neutropenia a few occasions over the last  month requiring G-CSF injections.  She is scheduled to start Pomalyst tomorrow.  She has 3 mg tablets at home.  Starting from her next Pomalyst refill, Dr. Arbutus Ped will reduce her dose to 2 mg.  We will monitor her labs closely and arrange for G-CSF injections if needed.  The patient does not have any significant tenderness to palpation on exam today and had some lower abdominal pain associate with diarrhea yesterday. She states it is not unusual for her to have diarrhea and she generally does not take imodium if she is not leaving the house. She is going to monitor her symptoms for now.  If she ever has diarrhea 6 or 7 times a day it would be best to take Imodium to avoid dehydration or electrolyte derangements.  However she has new or worsening abdominal pain, persistent diarrhea, fevers, blood in the stool, etc. that she would need to be reevaluated and possibly have stool samples performed.  The patient denies any known history of diverticulosis.  I reviewed her most recent CT scan of her abdomen pelvis which did not mention any diverticulosis.    He has neutropenia on labs today with a total white blood cell count of 2.6 and ANC of 1.2.  She will proceed with Darzalex today as scheduled.   I will arrange for repeat myeloma labs to be performed when she comes in on 12//24 and we will review these at her appointment on 12/26/2022.  The patient was advised to call immediately if she has any concerning symptoms in the interval. The patient voices understanding of current disease status and treatment options and  is in agreement with the current care plan. All questions were answered. The patient knows to call the clinic with any problems, questions or concerns. We can certainly see the patient much sooner if necessary   No orders of the defined types were placed in this encounter.   The total time spent in the appointment was 30-39 minutes  Brenda Wherley L Breanna Shorkey, PA-C 11/22/22

## 2022-11-23 ENCOUNTER — Inpatient Hospital Stay: Payer: 59

## 2022-11-23 DIAGNOSIS — Z5112 Encounter for antineoplastic immunotherapy: Secondary | ICD-10-CM | POA: Diagnosis not present

## 2022-11-23 DIAGNOSIS — T451X5A Adverse effect of antineoplastic and immunosuppressive drugs, initial encounter: Secondary | ICD-10-CM

## 2022-11-23 MED ORDER — FILGRASTIM-SNDZ 480 MCG/0.8ML IJ SOSY
480.0000 ug | PREFILLED_SYRINGE | Freq: Once | INTRAMUSCULAR | Status: AC
  Administered 2022-11-23: 480 ug via SUBCUTANEOUS
  Filled 2022-11-23: qty 0.8

## 2022-11-24 NOTE — Progress Notes (Signed)
GYNECOLOGY  VISIT  CC:   h/o right ovarian cyst, follow up ultrasound.  HPI: 62 y.o. G0P0000 Single White or Caucasian female here for follow up after having repeat ultrasound done today.  She has a know right ovarian cyst.  She has hx of menorrhagia that was treated with endometrial ablation.  Sne she's done quite well form bleeding standpoint since that procedure several years ago.  Today, the ultrasound showed uterus measuring 6 x 3.6 x 2.7cm.  Right ovary 5.4 x 3.8 x 4.5cm with simple 5.0 x 3.6 x 4.7cyst.  This is mildly increased in size from prior ultrasound.  Images reviewed and findings discussed.     Past Medical History:  Diagnosis Date   Acid reflux    Anemia    Anxiety    Asthma    Cancer (HCC)    waldenstroms/ macroglobinulemia   Depression    Depression    Diabetes mellitus without complication (HCC)    Dysuria 02/28/2016   Gallstones    Heart palpitations    Hypercholesteremia    Hypertension    Hypothyroidism    Macroglobulinemia    ? POEMS syndrome   Monoclonal gammopathy of unknown significance (MGUS)    Multiple myeloma (HCC)    Obesity    POEMS syndrome    PONV (postoperative nausea and vomiting)    Sleep apnea    CPAP at bedtime    MEDS:   Current Outpatient Medications on File Prior to Visit  Medication Sig Dispense Refill   ACCU-CHEK AVIVA PLUS test strip USE TO CHECK SUGAR TWICE A DAY 90     acyclovir (ZOVIRAX) 400 MG tablet Take 1 tablet (400 mg total) by mouth 2 (two) times daily. 60 tablet 11   ALPRAZolam (XANAX) 0.5 MG tablet Take 1 tablet (0.5 mg total) by mouth at bedtime as needed for anxiety. 30 tablet 0   aspirin 81 MG chewable tablet Chew by mouth daily.     Azelastine HCl 0.15 % SOLN as needed.     B-D UF III MINI PEN NEEDLES 31G X 5 MM MISC      Blood Glucose Monitoring Suppl (ONE TOUCH ULTRA MINI) W/DEVICE KIT See admin instructions. Reported on 01/25/2015  0   cetirizine (ZYRTEC ALLERGY) 10 MG tablet Take 1 tablet by mouth daily.      dexamethasone (DECADRON) 4 MG tablet Take 5 tablets (20 mg total) by mouth once a week. Take the day after darzalex faspro. Take with breakfast. 20 tablet 11   DiphenhydrAMINE HCl (BENADRYL ALLERGY PO) Take 1 Dose by mouth daily at 6 (six) AM.     fexofenadine (ALLEGRA ALLERGY) 180 MG tablet Take 1 tablet by mouth daily.     Fluticasone-Salmeterol (ADVAIR) 100-50 MCG/DOSE AEPB Inhale 2 puffs into the lungs every 12 (twelve) hours.     glucosamine-chondroitin 500-400 MG tablet Take 1 tablet by mouth daily.     ibuprofen (ADVIL,MOTRIN) 100 MG tablet Take 100 mg by mouth every 6 (six) hours as needed. Reported on 05/31/2015     Insulin Glargine (BASAGLAR KWIKPEN) 100 UNIT/ML Inject 20 Units into the skin at bedtime.     levothyroxine (SYNTHROID) 137 MCG tablet Take 137 mcg by mouth daily.     lisinopril (PRINIVIL,ZESTRIL) 10 MG tablet Take 10 mg by mouth daily. auth number 05/29/2018 [id number]  3   loperamide (IMODIUM) 2 MG capsule Take 2 mg by mouth as needed for diarrhea or loose stools.     magic mouthwash w/lidocaine SOLN Take  5 mLs by mouth 4 (four) times daily as needed for mouth pain. Swish, Gargle, and spit 240 mL 1   Melatonin 5 MG CAPS Take 1 capsule by mouth at bedtime.     metFORMIN (GLUCOPHAGE-XR) 500 MG 24 hr tablet Take 500 mg by mouth daily.     NOVOLOG FLEXPEN 100 UNIT/ML FlexPen Inject 15 Units into the skin in the morning, at noon, and at bedtime. Per Sliding Scale     omeprazole (PRILOSEC) 20 MG capsule Take 20 mg by mouth 2 (two) times daily.     ondansetron (ZOFRAN) 8 MG tablet Take 1 tablet (8 mg total) by mouth every 8 (eight) hours as needed for nausea or vomiting. 30 tablet 1   pomalidomide (POMALYST) 3 MG capsule Take 1 capsule (3 mg total) by mouth daily. for 21 days and off for 7 days.[name] # [id number]     Date Obtained 11/02/2022 Adult female not of childbearing potential 21 capsule 0   pravastatin (PRAVACHOL) 80 MG tablet Take 80 mg by mouth daily.     PROAIR HFA  108 (90 Base) MCG/ACT inhaler Reported on 06/21/2015  1   prochlorperazine (COMPAZINE) 10 MG tablet Take 1 tablet (10 mg total) by mouth every 6 (six) hours as needed for nausea or vomiting. 30 tablet 1   sertraline (ZOLOFT) 50 MG tablet TAKE 3 TABLETS BY MOUTH EVERY DAY 270 tablet 1   verapamil (CALAN-SR) 240 MG CR tablet Take 240 mg by mouth 2 (two) times daily.     No current facility-administered medications on file prior to visit.    ALLERGIES: Codeine, Hydrocodone, Lortab [hydrocodone-acetaminophen], Onion, Shellfish allergy, and Amoxicillin  SH:  single, non smoker  Review of Systems  Constitutional: Negative.   Genitourinary: Negative.     PHYSICAL EXAMINATION:    BP (!) 144/67 (BP Location: Left Arm, Patient Position: Sitting, Cuff Size: Normal)   Pulse 66   Ht 5\' 7"  (1.702 m)   Wt 267 lb 6.4 oz (121.3 kg)   BMI 41.88 kg/m     Physical Exam Constitutional:      Appearance: Normal appearance.  Neurological:     General: No focal deficit present.     Mental Status: She is alert.  Psychiatric:        Mood and Affect: Mood normal.      Assessment/Plan: 1. Ovarian neoplasm - will check ca-125.  If normal, plan to repeat ultrasound in 6 months as cyst has increased mildly in size - US PELVIS TRANSVAGINAL NON-OB (TV ONLY); Future - CA 125   Total time with pt:  22 minutes

## 2022-11-28 ENCOUNTER — Inpatient Hospital Stay: Payer: 59

## 2022-11-28 ENCOUNTER — Other Ambulatory Visit: Payer: Self-pay

## 2022-11-28 ENCOUNTER — Inpatient Hospital Stay (HOSPITAL_BASED_OUTPATIENT_CLINIC_OR_DEPARTMENT_OTHER): Payer: 59 | Admitting: Physician Assistant

## 2022-11-28 VITALS — BP 147/62 | HR 74 | Temp 97.4°F | Resp 18 | Ht 67.0 in | Wt 268.5 lb

## 2022-11-28 DIAGNOSIS — C9000 Multiple myeloma not having achieved remission: Secondary | ICD-10-CM

## 2022-11-28 DIAGNOSIS — Z5112 Encounter for antineoplastic immunotherapy: Secondary | ICD-10-CM | POA: Diagnosis not present

## 2022-11-28 LAB — CBC WITH DIFFERENTIAL (CANCER CENTER ONLY)
Abs Immature Granulocytes: 0.11 10*3/uL — ABNORMAL HIGH (ref 0.00–0.07)
Basophils Absolute: 0.1 10*3/uL (ref 0.0–0.1)
Basophils Relative: 4 %
Eosinophils Absolute: 0 10*3/uL (ref 0.0–0.5)
Eosinophils Relative: 1 %
HCT: 27.2 % — ABNORMAL LOW (ref 36.0–46.0)
Hemoglobin: 8.7 g/dL — ABNORMAL LOW (ref 12.0–15.0)
Immature Granulocytes: 4 %
Lymphocytes Relative: 27 %
Lymphs Abs: 0.7 10*3/uL (ref 0.7–4.0)
MCH: 27.4 pg (ref 26.0–34.0)
MCHC: 32 g/dL (ref 30.0–36.0)
MCV: 85.5 fL (ref 80.0–100.0)
Monocytes Absolute: 0.4 10*3/uL (ref 0.1–1.0)
Monocytes Relative: 17 %
Neutro Abs: 1.2 10*3/uL — ABNORMAL LOW (ref 1.7–7.7)
Neutrophils Relative %: 47 %
Platelet Count: 119 10*3/uL — ABNORMAL LOW (ref 150–400)
RBC: 3.18 MIL/uL — ABNORMAL LOW (ref 3.87–5.11)
RDW: 18.6 % — ABNORMAL HIGH (ref 11.5–15.5)
WBC Count: 2.6 10*3/uL — ABNORMAL LOW (ref 4.0–10.5)
nRBC: 0 % (ref 0.0–0.2)

## 2022-11-28 LAB — CMP (CANCER CENTER ONLY)
ALT: 19 U/L (ref 0–44)
AST: 34 U/L (ref 15–41)
Albumin: 3.8 g/dL (ref 3.5–5.0)
Alkaline Phosphatase: 116 U/L (ref 38–126)
Anion gap: 7 (ref 5–15)
BUN: 7 mg/dL — ABNORMAL LOW (ref 8–23)
CO2: 25 mmol/L (ref 22–32)
Calcium: 8.7 mg/dL — ABNORMAL LOW (ref 8.9–10.3)
Chloride: 106 mmol/L (ref 98–111)
Creatinine: 0.73 mg/dL (ref 0.44–1.00)
GFR, Estimated: >60 mL/min (ref >60)
Glucose, Bld: 126 mg/dL — ABNORMAL HIGH (ref 70–99)
Potassium: 3.9 mmol/L (ref 3.5–5.1)
Sodium: 138 mmol/L (ref 135–145)
Total Bilirubin: 0.7 mg/dL (ref <1.2)
Total Protein: 6.6 g/dL (ref 6.5–8.1)

## 2022-11-28 MED ORDER — DEXAMETHASONE 4 MG PO TABS
20.0000 mg | ORAL_TABLET | Freq: Once | ORAL | Status: AC
Start: 2022-11-28 — End: 2022-11-28
  Administered 2022-11-28: 20 mg via ORAL
  Filled 2022-11-28: qty 5

## 2022-11-28 MED ORDER — DIPHENHYDRAMINE HCL 25 MG PO CAPS
50.0000 mg | ORAL_CAPSULE | Freq: Once | ORAL | Status: AC
  Administered 2022-11-28: 50 mg via ORAL
  Filled 2022-11-28: qty 2

## 2022-11-28 MED ORDER — DARATUMUMAB-HYALURONIDASE-FIHJ 1800-30000 MG-UT/15ML ~~LOC~~ SOLN
1800.0000 mg | Freq: Once | SUBCUTANEOUS | Status: AC
  Administered 2022-11-28: 1800 mg via SUBCUTANEOUS
  Filled 2022-11-28: qty 15

## 2022-11-28 MED ORDER — ACETAMINOPHEN 325 MG PO TABS
650.0000 mg | ORAL_TABLET | Freq: Once | ORAL | Status: AC
  Administered 2022-11-28: 650 mg via ORAL
  Filled 2022-11-28: qty 2

## 2022-11-28 NOTE — Patient Instructions (Signed)

## 2022-11-30 ENCOUNTER — Other Ambulatory Visit: Payer: Self-pay

## 2022-12-03 ENCOUNTER — Other Ambulatory Visit: Payer: Self-pay | Admitting: Medical Oncology

## 2022-12-03 DIAGNOSIS — C9000 Multiple myeloma not having achieved remission: Secondary | ICD-10-CM

## 2022-12-03 MED ORDER — POMALIDOMIDE 2 MG PO CAPS: 2.0000 mg | ORAL_CAPSULE | Freq: Every day | ORAL | 0 refills | Status: AC

## 2022-12-04 ENCOUNTER — Ambulatory Visit: Payer: 59

## 2022-12-04 ENCOUNTER — Other Ambulatory Visit: Payer: 59

## 2022-12-04 ENCOUNTER — Ambulatory Visit: Payer: 59 | Admitting: Physician Assistant

## 2022-12-05 ENCOUNTER — Other Ambulatory Visit: Payer: Self-pay

## 2022-12-12 ENCOUNTER — Inpatient Hospital Stay: Payer: 59 | Attending: Internal Medicine

## 2022-12-12 ENCOUNTER — Inpatient Hospital Stay: Payer: 59

## 2022-12-12 DIAGNOSIS — Z7961 Long term (current) use of immunomodulator: Secondary | ICD-10-CM | POA: Diagnosis not present

## 2022-12-12 DIAGNOSIS — C9000 Multiple myeloma not having achieved remission: Secondary | ICD-10-CM | POA: Diagnosis present

## 2022-12-12 DIAGNOSIS — R197 Diarrhea, unspecified: Secondary | ICD-10-CM | POA: Diagnosis not present

## 2022-12-12 DIAGNOSIS — D472 Monoclonal gammopathy: Secondary | ICD-10-CM | POA: Insufficient documentation

## 2022-12-12 DIAGNOSIS — Z9049 Acquired absence of other specified parts of digestive tract: Secondary | ICD-10-CM | POA: Insufficient documentation

## 2022-12-12 DIAGNOSIS — Z7969 Long term (current) use of other immunomodulators and immunosuppressants: Secondary | ICD-10-CM | POA: Insufficient documentation

## 2022-12-12 DIAGNOSIS — D696 Thrombocytopenia, unspecified: Secondary | ICD-10-CM | POA: Diagnosis not present

## 2022-12-12 DIAGNOSIS — Z880 Allergy status to penicillin: Secondary | ICD-10-CM | POA: Diagnosis not present

## 2022-12-12 DIAGNOSIS — Z79899 Other long term (current) drug therapy: Secondary | ICD-10-CM | POA: Diagnosis not present

## 2022-12-12 DIAGNOSIS — Z5112 Encounter for antineoplastic immunotherapy: Secondary | ICD-10-CM | POA: Insufficient documentation

## 2022-12-12 DIAGNOSIS — Z885 Allergy status to narcotic agent status: Secondary | ICD-10-CM | POA: Diagnosis not present

## 2022-12-12 DIAGNOSIS — Z7952 Long term (current) use of systemic steroids: Secondary | ICD-10-CM | POA: Diagnosis not present

## 2022-12-12 LAB — CBC WITH DIFFERENTIAL (CANCER CENTER ONLY)
Abs Immature Granulocytes: 0.01 10*3/uL (ref 0.00–0.07)
Basophils Absolute: 0 10*3/uL (ref 0.0–0.1)
Basophils Relative: 2 %
Eosinophils Absolute: 0.2 10*3/uL (ref 0.0–0.5)
Eosinophils Relative: 9 %
HCT: 25.8 % — ABNORMAL LOW (ref 36.0–46.0)
Hemoglobin: 8.1 g/dL — ABNORMAL LOW (ref 12.0–15.0)
Immature Granulocytes: 0 %
Lymphocytes Relative: 11 %
Lymphs Abs: 0.3 10*3/uL — ABNORMAL LOW (ref 0.7–4.0)
MCH: 27.6 pg (ref 26.0–34.0)
MCHC: 31.4 g/dL (ref 30.0–36.0)
MCV: 87.8 fL (ref 80.0–100.0)
Monocytes Absolute: 0.3 10*3/uL (ref 0.1–1.0)
Monocytes Relative: 14 %
Neutro Abs: 1.6 10*3/uL — ABNORMAL LOW (ref 1.7–7.7)
Neutrophils Relative %: 64 %
Platelet Count: 25 10*3/uL — ABNORMAL LOW (ref 150–400)
RBC: 2.94 MIL/uL — ABNORMAL LOW (ref 3.87–5.11)
RDW: 18.8 % — ABNORMAL HIGH (ref 11.5–15.5)
WBC Count: 2.5 10*3/uL — ABNORMAL LOW (ref 4.0–10.5)
nRBC: 0 % (ref 0.0–0.2)

## 2022-12-12 LAB — CMP (CANCER CENTER ONLY)
ALT: 26 U/L (ref 0–44)
AST: 25 U/L (ref 15–41)
Albumin: 3.7 g/dL (ref 3.5–5.0)
Alkaline Phosphatase: 152 U/L — ABNORMAL HIGH (ref 38–126)
Anion gap: 8 (ref 5–15)
BUN: 8 mg/dL (ref 8–23)
CO2: 24 mmol/L (ref 22–32)
Calcium: 8.7 mg/dL — ABNORMAL LOW (ref 8.9–10.3)
Chloride: 101 mmol/L (ref 98–111)
Creatinine: 0.78 mg/dL (ref 0.44–1.00)
GFR, Estimated: >60 mL/min (ref >60)
Glucose, Bld: 212 mg/dL — ABNORMAL HIGH (ref 70–99)
Potassium: 4 mmol/L (ref 3.5–5.1)
Sodium: 133 mmol/L — ABNORMAL LOW (ref 135–145)
Total Bilirubin: 1 mg/dL (ref <1.2)
Total Protein: 6.4 g/dL — ABNORMAL LOW (ref 6.5–8.1)

## 2022-12-12 LAB — LACTATE DEHYDROGENASE: LDH: 139 U/L (ref 98–192)

## 2022-12-12 NOTE — Progress Notes (Signed)
Per Dr. Arbutus Ped, no Darzalex Faspro today due to platelets 25.  Patient also to hold Pomalyst per MD.  Patient verbalized understanding.  Patient informed that RN would be in touch about when to restart Pomalyst.

## 2022-12-12 NOTE — Patient Instructions (Signed)
Thrombocytopenia Thrombocytopenia is a condition in which there are a low number of platelets in the blood. Platelets are also called thrombocytes. Platelets are parts of blood that stick together and form a clot to help the body stop bleeding after an injury. If you have too few platelets, your blood may have trouble clotting. This may cause you to bleed and bruise very easily. Some cases of thrombocytopenia are mild while others are more severe. What are the causes? This condition is caused by a low number of platelets in your blood. There are three main reasons for this: Your body not making enough platelets. This may be caused by: Bone marrow diseases. This include aplastic anemia, leukemia, and myelodysplastic anemia. Congenital thrombocytopenia. This is a condition that is passed from parent to child (inherited). Certain cancer treatments, including chemotherapy and radiation therapy. Infections from bacteria or viruses. Alcohol use disorder and alcoholism. Platelets not being released in the blood. This is called platelet sequestration and it can happen due to: An overactive spleen (hypersplenism). The spleen gathers up platelets from circulation, meaning that the platelets are not available to help with clotting your blood. The spleen can be enlarged because of scarring or other conditions. Gaucher disease. Your body destroying platelets too quickly. This may be caused by: An autoimmune disease that causes immune thrombocytopenia (ITP). ITP is sometimes associated with other autoimmune conditions such as lupus. Certain medicines, such as blood thinners. Certain blood clotting or bleeding disorders. Exposure to toxic chemicals, such as pesticides, lead, benzene, and arsenic. Pregnancy. What are the signs or symptoms? Symptoms of this condition are the result of poor blood clotting. They will vary depending on how low the platelet counts are. Symptoms may include: Bruising  easily. Bleeding from the mouth or nose. Heavy menstrual periods. Blood in the urine, stool (feces), or vomit. Purplish-red discolorations on the skin (purpura). A rash that looks like pinpoint, purplish-red spots (petechiae) on the lower legs. How is this diagnosed?  This condition may be diagnosed with blood tests and a physical exam. You may also have other tests, including: A sample of bone marrow (biopsy) may be removed to look for the original cells that make platelets. An ultrasound or CT scan of the abdomen to check for an enlarged spleen, enlarged lymph nodes, or liver problems. How is this treated? Treatment for this condition depends on the cause. Treatment may include: Treatment of another condition that is causing the low platelet count. Medicines to help protect your platelets from being destroyed. A replacement (transfusion) of platelets to stop or prevent bleeding. Surgery to remove the spleen. Follow these instructions at home: Medicines Take over-the-counter and prescription medicines only as told by your health care provider. Do not take any medicines that contain aspirin or NSAIDs, such as ibuprofen. These medicines increase your risk for dangerous bleeding. Activity Avoid activities that could cause injury or bruising, and follow instructions about how to prevent falls. Do not play contact sports. Ask your health care provider what activities are safe for you. Take extra care to protect yourself from burns when ironing or cooking. Take extra care not to cut yourself when you shave or when you use scissors, needles, knives, and other tools. General instructions  Check your skin and the inside of your mouth for bruising or bleeding as told by your health care provider. Wear a medical alert bracelet that says that you have a bleeding disorder. This can help you get the treatment you need in case of emergency. Check   your urine and stool for blood as told by your health  care provider. Do not drink alcohol. If you do drink alcohol, limit the amount that you drink. Minimize contact with toxic chemicals. Tell all your health care providers, including dental care providers and eye doctors, about your condition. Make sure to tell dental care providers before you have any procedure done, including dental cleanings. Keep all follow-up visits. This is important. Contact a health care provider if: You have unexplained bruising. You have new symptoms. You have symptoms that get worse. You have a fever. Get help right away if: You have severe bleeding from anywhere on your body. You have blood in your vomit, urine, or stool. You have an injury to your head. You have a sudden, severe headache. Summary Thrombocytopenia is a condition in which you have a low number of platelets in the blood. Platelets are parts of blood that stick together to form a clot. Symptoms of this condition are the result of poor blood clotting and may include bruising easily, bleeding from the nose or mouth, petechiae, and purpura. This condition may be diagnosed with blood tests and a physical exam. Treatment for this condition depends on the cause. This information is not intended to replace advice given to you by your health care provider. Make sure you discuss any questions you have with your health care provider. Document Revised: 06/09/2020 Document Reviewed: 06/09/2020 Elsevier Patient Education  2024 [employer]vier Inc.  

## 2022-12-13 LAB — KAPPA/LAMBDA LIGHT CHAINS
Kappa free light chain: 11.5 mg/L (ref 3.3–19.4)
Kappa, lambda light chain ratio: 0.01 — ABNORMAL LOW (ref 0.26–1.65)
Lambda free light chains: 2134.3 mg/L — ABNORMAL HIGH (ref 5.7–26.3)

## 2022-12-14 LAB — BETA 2 MICROGLOBULIN, SERUM: Beta-2 Microglobulin: 2.2 mg/L (ref 0.6–2.4)

## 2022-12-14 LAB — IGG, IGA, IGM
IgA: 52 mg/dL — ABNORMAL LOW (ref 87–352)
IgG (Immunoglobin G), Serum: 388 mg/dL — ABNORMAL LOW (ref 586–1602)
IgM (Immunoglobulin M), Srm: 852 mg/dL — ABNORMAL HIGH (ref 26–217)

## 2022-12-17 ENCOUNTER — Encounter: Payer: Self-pay | Admitting: Internal Medicine

## 2022-12-19 ENCOUNTER — Ambulatory Visit: Payer: 59 | Admitting: Physician Assistant

## 2022-12-19 ENCOUNTER — Other Ambulatory Visit: Payer: 59

## 2022-12-19 ENCOUNTER — Ambulatory Visit: Payer: 59

## 2022-12-21 NOTE — Progress Notes (Unsigned)
[facility] OFFICE PROGRESS NOTE  [name], MD [address] [place] [address]  DIAGNOSIS: Plasma cell dyscrasia initially diagnosed as MGUS in September 2010.   PRIOR THERAPY: 1) Velcade 1.3 MG/M2 subcutaneously with Decadron 40 mg by mouth on a weekly basis. First cycle 11/24/2013. She status post 31 weekly doses of treatment. 2) Velcade 1.3 MG/M2 subcutaneously and weekly basis with Decadron 40 mg by mouth weekly. First dose 02/01/2015. Status post 28 cycles. 3) Revlimid 25 mg by mouth daily for 21 days every 4 weeks with weekly Decadron 20 mg. started in 11/27/2015. Status post 3 cycles discontinued secondary to lack of response. 4) Systemic treatment with Velcade 1.3 MG/KG weekly, Revlimid 25 mg by mouth daily for 21 days every 4 weeks in addition to Decadron 20 mg by mouth weekly. First dose 03/06/2016. Status post 42 cycles.  She has a break off treatment from June 2021 until July 2022.  CURRENT THERAPY:  Second line treatment with daratumumab, Pomalyst 3 mg for 21 days every 4 weeks as well as Decadron 20 mg p.o. weekly.  First dose 09/26/2022.  Status post day 1 cycle #3. Reducing dose of Pomalyst to 2 mg starting with the next cycle of treatment in December 2024. Her care plan is currently on hold so she can see Dr. Marissa Calamity.  INTERVAL HISTORY: JALAH GOTTO 62 y.o. female returns to the clinic today for a follow-up visit.  The patient was last seen on 11/28/2022.  At that point in time, the patient was having some worsening thrombocytopenia.  Dr. Arbutus Ped recommended dose reduction of her Pomalyst to 2 mg starting from her cycle of treatment that starts today. S She has been having intermittent thrombocytopenia and neutropenia.  She has required G-CSF injections. She has been off treatment since her treatment was held on 12/4 for thrombocytopenia.   She is leaving to visit family for the holiday from 12/20-01/11/22. Today she denies any fever, chills, or  unexplained weight loss. She gets cold every once in awhile but it goes away. Her appetite is "ok".  Denies any signs and symptoms of infection including sore throat, cough, or skin infections. She gets diarrhea on and off at baseline. At her last appointment, she had some diarrhea and LLQ pain which has since resolved.  Denies any abnormal bleeding or bruising.  She recently had a repeat myeloma labs performed.  She is here today for evaluation and repeat blood work before undergoing day 15 cycle 3.  MEDICAL HISTORY: Past Medical History:  Diagnosis Date   Acid reflux    Anemia    Anxiety    Asthma    Cancer (HCC)    waldenstroms/ macroglobinulemia   Depression    Depression    Diabetes mellitus without complication (HCC)    Dysuria 02/28/2016   Gallstones    Heart palpitations    Hypercholesteremia    Hypertension    Hypothyroidism    Macroglobulinemia    ? POEMS syndrome   Monoclonal gammopathy of unknown significance (MGUS)    Multiple myeloma (HCC)    Obesity    POEMS syndrome    PONV (postoperative nausea and vomiting)    Sleep apnea    CPAP at bedtime    ALLERGIES:  is allergic to codeine, hydrocodone, lortab [hydrocodone-acetaminophen], onion, shellfish allergy, and amoxicillin.  MEDICATIONS:  Current Outpatient Medications  Medication Sig Dispense Refill   ACCU-CHEK AVIVA PLUS test strip USE TO CHECK SUGAR TWICE A DAY  90     acyclovir (ZOVIRAX) 400 MG tablet Take 1 tablet (400 mg total) by mouth 2 (two) times daily. 60 tablet 11   ALPRAZolam (XANAX) 0.5 MG tablet Take 1 tablet (0.5 mg total) by mouth at bedtime as needed for anxiety. 30 tablet 0   aspirin 81 MG chewable tablet Chew by mouth daily.     Azelastine HCl 0.15 % SOLN as needed.     B-D UF III MINI PEN NEEDLES 31G X 5 MM MISC      Blood Glucose Monitoring Suppl (ONE TOUCH ULTRA MINI) W/DEVICE KIT See admin instructions. Reported on 01/25/2015  0   cetirizine (ZYRTEC ALLERGY) 10 MG tablet Take 1 tablet by  mouth daily.     Continuous Glucose Sensor (FREESTYLE LIBRE 3 SENSOR) MISC USE 1 SENSOR EVERY 14 DAYS     dexamethasone (DECADRON) 4 MG tablet Take 5 tablets (20 mg total) by mouth once a week. Take the day after darzalex faspro. Take with breakfast. 20 tablet 11   DiphenhydrAMINE HCl (BENADRYL ALLERGY PO) Take 1 Dose by mouth daily at 6 (six) AM.     fexofenadine (ALLEGRA ALLERGY) 180 MG tablet Take 1 tablet by mouth daily.     Fluticasone-Salmeterol (ADVAIR) 100-50 MCG/DOSE AEPB Inhale 2 puffs into the lungs every 12 (twelve) hours.     glucosamine-chondroitin 500-400 MG tablet Take 1 tablet by mouth daily.     ibuprofen (ADVIL,MOTRIN) 100 MG tablet Take 100 mg by mouth every 6 (six) hours as needed. Reported on 05/31/2015     Insulin Glargine (BASAGLAR KWIKPEN) 100 UNIT/ML Inject 20 Units into the skin at bedtime.     levothyroxine (SYNTHROID) 137 MCG tablet Take 137 mcg by mouth daily.     lisinopril (PRINIVIL,ZESTRIL) 10 MG tablet Take 10 mg by mouth daily. auth number 05/29/2018 [id number]  3   loperamide (IMODIUM) 2 MG capsule Take 2 mg by mouth as needed for diarrhea or loose stools.     magic mouthwash w/lidocaine SOLN Take 5 mLs by mouth 4 (four) times daily as needed for mouth pain. Swish, Gargle, and spit 240 mL 1   Melatonin 5 MG CAPS Take 1 capsule by mouth at bedtime.     metFORMIN (GLUCOPHAGE-XR) 500 MG 24 hr tablet Take 500 mg by mouth daily.     NOVOLOG FLEXPEN 100 UNIT/ML FlexPen Inject 15 Units into the skin in the morning, at noon, and at bedtime. Per Sliding Scale     omeprazole (PRILOSEC) 20 MG capsule Take 20 mg by mouth 2 (two) times daily.     ondansetron (ZOFRAN) 8 MG tablet Take 1 tablet (8 mg total) by mouth every 8 (eight) hours as needed for nausea or vomiting. 30 tablet 1   pravastatin (PRAVACHOL) 80 MG tablet Take 80 mg by mouth daily.     PROAIR HFA 108 (90 Base) MCG/ACT inhaler Reported on 06/21/2015  1   prochlorperazine (COMPAZINE) 10 MG tablet Take 1 tablet (10 mg  total) by mouth every 6 (six) hours as needed for nausea or vomiting. 30 tablet 1   sertraline (ZOLOFT) 50 MG tablet TAKE 3 TABLETS BY MOUTH EVERY DAY 270 tablet 1   verapamil (CALAN-SR) 240 MG CR tablet Take 240 mg by mouth 2 (two) times daily.     No current facility-administered medications for this visit.    SURGICAL HISTORY:  Past Surgical History:  Procedure Laterality Date   ABLATION  09/09/2007   HTA and polyp resection   BONE MARROW  BIOPSY  04/08/2012   BONE MARROW BIOPSY  01/2020   CATARACT EXTRACTION, BILATERAL Bilateral 02/2022   FOOT SURGERY Bilateral 01/09/1996   small toe   KNEE ARTHROSCOPY W/ MENISCAL REPAIR Bilateral 10/10 ; 3/11   LAPAROSCOPIC CHOLECYSTECTOMY  01/09/1995   LUMBAR DISC SURGERY  03/08/2004   herniation, L4-L5   NASAL SINUS SURGERY  01/08/2002   UTERINE FIBROID EMBOLIZATION  2009   HTA and polyp resection    REVIEW OF SYSTEMS:   Constitutional: Positive for fatigue. Negative for appetite change, chills, fever and unexpected weight change.  HENT: Positive for frequent nasal congestion due to allergies. Negative for mouth sores, nosebleeds, sore throat and trouble swallowing.   Eyes: Negative for eye problems and icterus.  Respiratory: Negative for cough, hemoptysis, shortness of breath and wheezing.   Cardiovascular: Negative for chest pain and leg swelling.  Gastrointestinal: Positive for intermittent diarrhea. Negative for  constipation,  nausea and vomiting.  Genitourinary: Negative for bladder incontinence, difficulty urinating, dysuria, frequency and hematuria.   Musculoskeletal: Negative for back pain, gait problem, neck pain and neck stiffness.  Skin: Negative for itching and rash.  Neurological: Negative for dizziness, extremity weakness, gait problem, headaches, light-headedness and seizures.  Hematological: Negative for adenopathy. Does not bruise/bleed easily.  Psychiatric/Behavioral: Negative for confusion, depression and sleep  disturbance. The patient is not nervous/anxious.    PHYSICAL EXAMINATION:  Blood pressure (!) 145/78, pulse 69, temperature (!) 97.3 F (36.3 C), temperature source Temporal, resp. rate 16, weight 266 lb 9 oz (120.9 kg), SpO2 100%.  ECOG PERFORMANCE STATUS: 1  Physical Exam  Constitutional: Oriented to person, place, and time and well-developed, well-nourished, and in no distress.  HENT:  Head: Normocephalic and atraumatic.  Mouth/Throat: Oropharynx is clear and moist. No oropharyngeal exudate.  Eyes: Conjunctivae are normal. Right eye exhibits no discharge. Left eye exhibits no discharge. No scleral icterus.  Neck: Normal range of motion. Neck supple.  Cardiovascular: Normal rate, regular rhythm, normal heart sounds and intact distal pulses.   Pulmonary/Chest: Effort normal and breath sounds normal. No respiratory distress. No wheezes. No rales.  Abdominal: Soft. Bowel sounds are normal. Exhibits no distension and no mass. No rebound tenderness.   Musculoskeletal: Normal range of motion. Exhibits no edema.  Lymphadenopathy:    No cervical adenopathy.  Neurological: Alert and oriented to person, place, and time. Exhibits normal muscle tone. Gait normal. Coordination normal.  Skin: Skin is warm and dry. No rash noted. Not diaphoretic. No erythema. No pallor.  Psychiatric: Mood, memory and judgment normal.  Vitals reviewed.  LABORATORY DATA: Lab Results  Component Value Date   WBC 2.0 (L) 12/26/2022   HGB 8.3 (L) 12/26/2022   HCT 27.1 (L) 12/26/2022   MCV 86.0 12/26/2022   PLT 107 (L) 12/26/2022      Chemistry      Component Value Date/Time   NA 139 12/26/2022 1037   NA 138 11/14/2016 0824   K 4.0 12/26/2022 1037   K 4.3 11/14/2016 0824   CL 106 12/26/2022 1037   CL 104 04/07/2012 1415   CO2 26 12/26/2022 1037   CO2 23 11/14/2016 0824   BUN 8 12/26/2022 1037   BUN 9.1 11/14/2016 0824   CREATININE 0.70 12/26/2022 1037   CREATININE 0.7 11/14/2016 0824      Component  Value Date/Time   CALCIUM 8.9 12/26/2022 1037   CALCIUM 9.6 11/14/2016 0824   ALKPHOS 121 12/26/2022 1037   ALKPHOS 94 11/14/2016 0824   AST 26 12/26/2022 1037  AST 28 11/14/2016 0824   ALT 15 12/26/2022 1037   ALT 24 11/14/2016 0824   BILITOT 0.6 12/26/2022 1037   BILITOT 0.41 11/14/2016 0824       RADIOGRAPHIC STUDIES:  No results found.   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 Her myeloma panel showed continuous increase in her free lambda light chain.    The patient previously underwent 107 cycles of subcutaneous weekly Velcade, as well as Revlimid and Decadron on and off over the last several years.   She had been off treatment since June 2021 until July 2022 in which she had evidence of disease progression.  Therefore, she was restarted on Velcade, Revlimid, and Decadron.  She has restarted treatment in July 2022 with cycle 107 of weekly velcade. She tolerated well without any concerning adverse side effects.  She was then given a break.   She was seen recently by Dr. Marissa Calamity at [facility] for second opinion and after extensive investigation is recommended for her to resume her treatment for the multiple myeloma with daratumumab, Pomalyst 3 Mg p.o. daily for 21 days every 4 weeks and Decadron 20 mg weekly. This was started in September 2024. She is tolerating it fair except today she has some progressive cytopenias.   We discussed reducing her dose of Pomalyst to 2 mg at her last appointment. She did not start due to treatment being held due to thrombocytopenia and neutropenia.   The patient had repeat myeloma labs performed.  The patient was seen with Dr. Arbutus Ped.  Dr. Arbutus Ped personally and independently reviewed the labs and discussed results with the patient today.  The labs show increase in lambda free light chains.  Dr. Arbutus Ped would like for her to see Dr.  Marissa Calamity for next line therapy options.   She has the contact for his office. She will call for an appointment. After the appointment, she will call us back to schedule follow up. I have put her careplan on hold.   The patient was advised to call immediately if she has any concerning symptoms in the interval. The patient voices understanding of current disease status and treatment options and is in agreement with the current care plan. All questions were answered. The patient knows to call the clinic with any problems, questions or concerns. We can certainly see the patient much sooner if necessary  No orders of the defined types were placed in this encounter.     Cylis Ayars L Bernardine Langworthy, PA-C 12/26/22  ADDENDUM: Hematology/Oncology Attending: I had a face-to-face encounter with the patient today.  I reviewed her records, lab, and recommended her care plan.  This is a very pleasant 62 years old white female with multiple myeloma initially diagnosed as MGUS in September 2010.  She has been on several treatment in the past including subcutaneous Velcade and Decadron.  She was also treated with Revlimid and Decadron for several years.  She has been on treatment with second line treatment with daratumumab Pomalyst and Decadron status post 3 cycles. She had repeat myeloma panel performed recently.  I discussed the lab result with the patient and unfortunately there is significant increase in the free lambda light chain up to 2134.3. I recommended for the patient to discontinue her current treatment with daratumumab, Pomalyst and Decadron at this point.  I recommended for the patient to see Dr. Marissa Calamity, a myeloma expert at [facility] in [place] who has seen  her in the past for recommendation regarding the next step in her treatment.  She could be a candidate for several treatment options including carfilzomib, Cytoxan and Decadron or one of the newer regimen for refractory multiple  myeloma.  The patient will arrange a follow-up appointment with Dr. Marissa Calamity after she returns from the Christmas vacation with her family in [place]. We will arrange a follow-up appointment for her after her evaluation in [place] and we will be happy to consider her for treatment locally based on the recommendation of Dr. Marissa Calamity. She is in agreement with the current plan. The patient was advised to call immediately if she has any other concerning symptoms in the interval. The total time spent in the appointment was 30 minutes. Disclaimer: This note was dictated with voice recognition software. Similar sounding words can inadvertently be transcribed and may be missed upon review.  Lajuana Matte, MD

## 2022-12-26 ENCOUNTER — Inpatient Hospital Stay: Payer: 59

## 2022-12-26 ENCOUNTER — Inpatient Hospital Stay: Payer: 59 | Admitting: Physician Assistant

## 2022-12-26 VITALS — BP 145/78 | HR 69 | Temp 97.3°F | Resp 16 | Wt 266.6 lb

## 2022-12-26 DIAGNOSIS — Z5111 Encounter for antineoplastic chemotherapy: Secondary | ICD-10-CM

## 2022-12-26 DIAGNOSIS — C9000 Multiple myeloma not having achieved remission: Secondary | ICD-10-CM | POA: Diagnosis not present

## 2022-12-26 DIAGNOSIS — Z5112 Encounter for antineoplastic immunotherapy: Secondary | ICD-10-CM | POA: Diagnosis not present

## 2022-12-26 LAB — CBC WITH DIFFERENTIAL (CANCER CENTER ONLY)
Abs Immature Granulocytes: 0 10*3/uL (ref 0.00–0.07)
Basophils Absolute: 0.1 10*3/uL (ref 0.0–0.1)
Basophils Relative: 4 %
Eosinophils Absolute: 0.1 10*3/uL (ref 0.0–0.5)
Eosinophils Relative: 4 %
HCT: 27.1 % — ABNORMAL LOW (ref 36.0–46.0)
Hemoglobin: 8.3 g/dL — ABNORMAL LOW (ref 12.0–15.0)
Immature Granulocytes: 0 %
Lymphocytes Relative: 34 %
Lymphs Abs: 0.7 10*3/uL (ref 0.7–4.0)
MCH: 26.3 pg (ref 26.0–34.0)
MCHC: 30.6 g/dL (ref 30.0–36.0)
MCV: 86 fL (ref 80.0–100.0)
Monocytes Absolute: 0.2 10*3/uL (ref 0.1–1.0)
Monocytes Relative: 11 %
Neutro Abs: 1 10*3/uL — ABNORMAL LOW (ref 1.7–7.7)
Neutrophils Relative %: 47 %
Platelet Count: 107 10*3/uL — ABNORMAL LOW (ref 150–400)
RBC: 3.15 MIL/uL — ABNORMAL LOW (ref 3.87–5.11)
RDW: 17 % — ABNORMAL HIGH (ref 11.5–15.5)
WBC Count: 2 10*3/uL — ABNORMAL LOW (ref 4.0–10.5)
nRBC: 0 % (ref 0.0–0.2)

## 2022-12-26 LAB — CMP (CANCER CENTER ONLY)
ALT: 15 U/L (ref 0–44)
AST: 26 U/L (ref 15–41)
Albumin: 4 g/dL (ref 3.5–5.0)
Alkaline Phosphatase: 121 U/L (ref 38–126)
Anion gap: 7 (ref 5–15)
BUN: 8 mg/dL (ref 8–23)
CO2: 26 mmol/L (ref 22–32)
Calcium: 8.9 mg/dL (ref 8.9–10.3)
Chloride: 106 mmol/L (ref 98–111)
Creatinine: 0.7 mg/dL (ref 0.44–1.00)
GFR, Estimated: >60 mL/min (ref >60)
Glucose, Bld: 125 mg/dL — ABNORMAL HIGH (ref 70–99)
Potassium: 4 mmol/L (ref 3.5–5.1)
Sodium: 139 mmol/L (ref 135–145)
Total Bilirubin: 0.6 mg/dL (ref <1.2)
Total Protein: 6.4 g/dL — ABNORMAL LOW (ref 6.5–8.1)

## 2023-01-08 ENCOUNTER — Other Ambulatory Visit: Payer: Self-pay | Admitting: Internal Medicine

## 2023-01-08 DIAGNOSIS — C9000 Multiple myeloma not having achieved remission: Secondary | ICD-10-CM

## 2023-01-10 ENCOUNTER — Ambulatory Visit: Payer: 59

## 2023-01-10 ENCOUNTER — Other Ambulatory Visit: Payer: 59

## 2023-01-23 ENCOUNTER — Ambulatory Visit: Payer: 59

## 2023-01-23 ENCOUNTER — Ambulatory Visit: Payer: 59 | Admitting: Internal Medicine

## 2023-01-23 ENCOUNTER — Other Ambulatory Visit: Payer: 59

## 2023-01-30 ENCOUNTER — Telehealth: Payer: Self-pay

## 2023-01-30 NOTE — Telephone Encounter (Signed)
Spoke with patient in regards to upcoming appts and second opinion.  Patient stated that she is seeing Dr. Marissa Calamity at [facility] tomorrow and will let us know the plan after tomorrow.  She asked to cancel appts on 1/29 due to second opinion and her dad just passed away and has to travel to [place]. Informed patient of our condolences from Dr. Asa Lente office.  Patient verbalized understanding.

## 2023-02-06 ENCOUNTER — Inpatient Hospital Stay: Payer: 59

## 2023-02-10 ENCOUNTER — Other Ambulatory Visit (HOSPITAL_BASED_OUTPATIENT_CLINIC_OR_DEPARTMENT_OTHER): Payer: Self-pay | Admitting: Obstetrics & Gynecology

## 2023-02-10 DIAGNOSIS — F419 Anxiety disorder, unspecified: Secondary | ICD-10-CM

## 2023-02-12 ENCOUNTER — Other Ambulatory Visit (HOSPITAL_BASED_OUTPATIENT_CLINIC_OR_DEPARTMENT_OTHER): Payer: Self-pay | Admitting: Obstetrics & Gynecology

## 2023-02-12 ENCOUNTER — Encounter: Payer: Self-pay | Admitting: Internal Medicine

## 2023-02-12 DIAGNOSIS — F419 Anxiety disorder, unspecified: Secondary | ICD-10-CM

## 2023-02-13 ENCOUNTER — Other Ambulatory Visit: Payer: Self-pay | Admitting: Internal Medicine

## 2023-02-13 MED ORDER — ALPRAZOLAM 0.5 MG PO TABS: 0.5000 mg | ORAL_TABLET | Freq: Every evening | ORAL | 0 refills | Status: DC | PRN

## 2023-02-13 NOTE — Progress Notes (Signed)
 DISCONTINUE ON PATHWAY REGIMEN - Multiple Myeloma and Other Plasma Cell Dyscrasias     Cycles 1 and 2: A cycle is every 28 days:     Pomalidomide       Dexamethasone       Daratumumab  and hyaluronidase -fihj    Cycles 3 through 6: A cycle is every 28 days:     Pomalidomide       Dexamethasone       Dexamethasone       Daratumumab  and hyaluronidase -fihj    Cycles 7 and beyond: A cycle is every 28 days:     Pomalidomide       Dexamethasone       Dexamethasone       Daratumumab  and hyaluronidase -fihj   **Always confirm dose/schedule in your pharmacy ordering system**  PRIOR TREATMENT: [id number]: DaraPd - Subcutaneous Daratumumab  (Daratumumab /hyaluronidase  SUBQ + Pomalidomide  + Dexamethasone ) Until Progression or Unacceptable Toxicity  START OFF PATHWAY REGIMEN - Multiple Myeloma and Other Plasma Cell Dyscrasias   [id number]:Carfilzomib  20/56 mg/m2 IV D1,8,15 + Dexamethasone  40 mg PO/IV I8,1,84,77 + Selinexor  80 mg PO D1,8,15,22 q28 Days:   Cycle 1: A cycle is 28 days:     Selinexor       Carfilzomib       Carfilzomib       Dexamethasone     Cycles 2 and beyond: A cycle is every 28 days:     Selinexor       Carfilzomib       Dexamethasone    **Always confirm dose/schedule in your pharmacy ordering system**  Patient Characteristics: Multiple Myeloma, Relapsed / Refractory, Second through Fourth Lines of Therapy, Not a Candidate for CAR T-cell Therapy, Fit or Candidate for Triplet Therapy, Lenalidomide -Refractory or Lenalidomide -based Regimen Not Preferred, Candidate for Anti-CD38  Antibody Disease Classification: Multiple Myeloma Therapeutic Status: Relapsed R2-ISS Staging: II Line of Therapy: Third Line Anti-CD38 Antibody Candidacy: Candidate for Anti-CD38 Antibody Lenalidomide -based Regimen Preference/Candidacy: Lenalidomide -based Regimen Not Preferred Intent of Therapy: Non-Curative / Palliative Intent, Discussed with Patient

## 2023-02-19 ENCOUNTER — Other Ambulatory Visit: Payer: Self-pay | Admitting: Medical Oncology

## 2023-02-19 DIAGNOSIS — C9000 Multiple myeloma not having achieved remission: Secondary | ICD-10-CM

## 2023-02-20 ENCOUNTER — Inpatient Hospital Stay: Payer: 59

## 2023-02-20 ENCOUNTER — Telehealth: Payer: Self-pay | Admitting: Pharmacy Technician

## 2023-02-20 ENCOUNTER — Other Ambulatory Visit (HOSPITAL_COMMUNITY): Payer: Self-pay

## 2023-02-20 ENCOUNTER — Other Ambulatory Visit: Payer: Self-pay | Admitting: Internal Medicine

## 2023-02-20 ENCOUNTER — Inpatient Hospital Stay: Payer: 59 | Attending: Internal Medicine | Admitting: Internal Medicine

## 2023-02-20 ENCOUNTER — Telehealth: Payer: Self-pay | Admitting: Pharmacist

## 2023-02-20 VITALS — BP 134/76 | HR 70 | Temp 98.2°F | Resp 18 | Ht 67.0 in | Wt 257.0 lb

## 2023-02-20 DIAGNOSIS — C9000 Multiple myeloma not having achieved remission: Secondary | ICD-10-CM

## 2023-02-20 DIAGNOSIS — Z880 Allergy status to penicillin: Secondary | ICD-10-CM | POA: Diagnosis not present

## 2023-02-20 DIAGNOSIS — R5383 Other fatigue: Secondary | ICD-10-CM | POA: Diagnosis not present

## 2023-02-20 DIAGNOSIS — Z9049 Acquired absence of other specified parts of digestive tract: Secondary | ICD-10-CM | POA: Diagnosis not present

## 2023-02-20 DIAGNOSIS — D472 Monoclonal gammopathy: Secondary | ICD-10-CM | POA: Diagnosis present

## 2023-02-20 DIAGNOSIS — I10 Essential (primary) hypertension: Secondary | ICD-10-CM | POA: Insufficient documentation

## 2023-02-20 DIAGNOSIS — Z885 Allergy status to narcotic agent status: Secondary | ICD-10-CM | POA: Diagnosis not present

## 2023-02-20 DIAGNOSIS — D649 Anemia, unspecified: Secondary | ICD-10-CM | POA: Diagnosis not present

## 2023-02-20 DIAGNOSIS — Z5112 Encounter for antineoplastic immunotherapy: Secondary | ICD-10-CM | POA: Insufficient documentation

## 2023-02-20 DIAGNOSIS — Z79899 Other long term (current) drug therapy: Secondary | ICD-10-CM | POA: Insufficient documentation

## 2023-02-20 LAB — CBC WITH DIFFERENTIAL (CANCER CENTER ONLY)
Abs Immature Granulocytes: 0 10*3/uL (ref 0.00–0.07)
Basophils Absolute: 0 10*3/uL (ref 0.0–0.1)
Basophils Relative: 1 %
Eosinophils Absolute: 0.1 10*3/uL (ref 0.0–0.5)
Eosinophils Relative: 1 %
HCT: 30 % — ABNORMAL LOW (ref 36.0–46.0)
Hemoglobin: 8.9 g/dL — ABNORMAL LOW (ref 12.0–15.0)
Immature Granulocytes: 0 %
Lymphocytes Relative: 17 %
Lymphs Abs: 0.7 10*3/uL (ref 0.7–4.0)
MCH: 24.7 pg — ABNORMAL LOW (ref 26.0–34.0)
MCHC: 29.7 g/dL — ABNORMAL LOW (ref 30.0–36.0)
MCV: 83.3 fL (ref 80.0–100.0)
Monocytes Absolute: 0.3 10*3/uL (ref 0.1–1.0)
Monocytes Relative: 6 %
Neutro Abs: 3.1 10*3/uL (ref 1.7–7.7)
Neutrophils Relative %: 75 %
Platelet Count: 113 10*3/uL — ABNORMAL LOW (ref 150–400)
RBC: 3.6 MIL/uL — ABNORMAL LOW (ref 3.87–5.11)
RDW: 15 % (ref 11.5–15.5)
WBC Count: 4.2 10*3/uL (ref 4.0–10.5)
nRBC: 0 % (ref 0.0–0.2)

## 2023-02-20 LAB — CMP (CANCER CENTER ONLY)
ALT: 16 U/L (ref 0–44)
AST: 29 U/L (ref 15–41)
Albumin: 4.2 g/dL (ref 3.5–5.0)
Alkaline Phosphatase: 146 U/L — ABNORMAL HIGH (ref 38–126)
Anion gap: 6 (ref 5–15)
BUN: 9 mg/dL (ref 8–23)
CO2: 28 mmol/L (ref 22–32)
Calcium: 9.2 mg/dL (ref 8.9–10.3)
Chloride: 104 mmol/L (ref 98–111)
Creatinine: 0.75 mg/dL (ref 0.44–1.00)
GFR, Estimated: >60 mL/min (ref >60)
Glucose, Bld: 147 mg/dL — ABNORMAL HIGH (ref 70–99)
Potassium: 4.3 mmol/L (ref 3.5–5.1)
Sodium: 138 mmol/L (ref 135–145)
Total Bilirubin: 0.6 mg/dL (ref 0.0–1.2)
Total Protein: 6.8 g/dL (ref 6.5–8.1)

## 2023-02-20 MED ORDER — SELINEXOR (60 MG ONCE WEEKLY) 60 MG PO TBPK: 60.0000 mg | ORAL_TABLET | ORAL | 0 refills | Status: DC

## 2023-02-20 MED ORDER — LIDOCAINE-PRILOCAINE 2.5-2.5 % EX CREA
TOPICAL_CREAM | CUTANEOUS | 3 refills | Status: DC
Start: 2023-02-20 — End: 2023-02-20

## 2023-02-20 MED ORDER — PROCHLORPERAZINE MALEATE 10 MG PO TABS: 10.0000 mg | ORAL_TABLET | Freq: Four times a day (QID) | ORAL | 1 refills | Status: DC | PRN

## 2023-02-20 MED ORDER — DEXAMETHASONE 4 MG PO TABS: ORAL_TABLET | ORAL | 4 refills | Status: AC

## 2023-02-20 MED ORDER — ONDANSETRON HCL 8 MG PO TABS: 8.0000 mg | ORAL_TABLET | Freq: Three times a day (TID) | ORAL | 1 refills | Status: DC | PRN

## 2023-02-20 MED ORDER — ACYCLOVIR 400 MG PO TABS: 400.0000 mg | ORAL_TABLET | Freq: Every day | ORAL | 3 refills | Status: DC

## 2023-02-20 NOTE — Telephone Encounter (Signed)
Oral Chemotherapy Pharmacist Encounter  Patient Education I met with patient in clinic for overview of new oral chemotherapy medication: Xpovio (selinexor) in conjunction with carfilzomib and dexamethasone for the treatment of IgG lambda multiple myeloma, planned duration until disease progression or unacceptable drug toxicity.   Pt is doing well. Counseled patient on administration, dosing, side effects, monitoring, drug-food interactions, safe handling, storage, and disposal.  CBC w/ Diff and CMP from 02/20/23 assessed, noted patient Hgb 8.9 g/dL, pltc of 161 K/uL. Patient starting on reduced dose of 60 mg weekly for first cycle (D1, 8, 15 and 22), and then if tolerated will increase to 80 mg weekly. Prescription dose and frequency assessed for appropriateness.  Patient will take Xpovio (selinexor) 60 mg tablets, 1 tablet (60 total daily dose) by mouth once a week.  Patient will take Zofran 30 minutes before taking Xpovio.   Xpovio start date: pending - will update encounter with specific start date  Side effects include but not limited to: nausea/vomiting, decreased blood counts, decreased appetite, fatigue, diarrhea, changes in electrolytes, and dizziness/mental status changes Nausea/Vomiting PPX:  Day prior to selinexor weekly dose: patient will take Zofran and olanzapine Day of selinexor weekly dose: patient will dose Zofran 30-60 minutes prior to selinexor, and then take olanzapine later that evening at bedtime Day after weekly selinexor dosing: patient will dose Zofran in AM and olanzapine in PM Patient also aware she can take Zofran q8h PRN N/V as well if needed while on therapy.  Diarrhea: Patient will obtain Imodium (loperamide) to have on hand if they experience diarrhea. Patient knows to alert the office of 4 or more loose stools above baseline.  Reviewed with patient importance of keeping a medication schedule and plan for any missed doses. Current medication list in Epic  reviewed, no relevant/significant DDIs with Xpovio identified:  After discussion with patient no patient barriers to medication adherence identified.    Patient agreement for treatment documented in MD note on 02/20/23.  Brenda Conner voiced understanding and appreciation. All questions answered. Medication handout provided.  Provided patient with Oral Chemotherapy Navigation Clinic phone number. Patient knows to call the office with questions or concerns. Oral Chemotherapy Navigation Clinic will continue to follow.  Sherry Ruffing, PharmD, BCPS, BCOP Hematology/Oncology Clinical Pharmacist [facility] Oral Chemotherapy Navigation Clinics [phone] 02/20/2023 12:29 PM

## 2023-02-20 NOTE — Progress Notes (Signed)
START ON PATHWAY REGIMEN - Multiple Myeloma and Other Plasma Cell Dyscrasias     Cycle 1: A cycle is 28 days:     Selinexor      Carfilzomib      Carfilzomib      Dexamethasone    Cycles 2 and beyond: A cycle is every 28 days:     Selinexor      Carfilzomib      Dexamethasone   **Always confirm dose/schedule in your pharmacy ordering system**  Patient Characteristics: Multiple Myeloma, Relapsed / Refractory, Second through Fourth Lines of Therapy, Not a Candidate for CAR T-cell Therapy, Fit or Candidate for Triplet Therapy, Lenalidomide-Refractory or Lenalidomide-based Regimen Not Preferred, Not a Candidate for  Anti-CD38 Antibody Disease Classification: Multiple Myeloma Therapeutic Status: Relapsed R2-ISS Staging: II Line of Therapy: Third Line Anti-CD38 Antibody Candidacy: Not a Candidate for Anti-CD38 Antibody Lenalidomide-based Regimen Preference/Candidacy: Lenalidomide-based Regimen Not Preferred Intent of Therapy: Non-Curative / Palliative Intent, Discussed with Patient

## 2023-02-20 NOTE — Telephone Encounter (Signed)
Oral Oncology Patient Advocate Encounter   Received notification that prior authorization for Xpovio is required.   PA submitted on 02/20/23 [id number] Status is pending     Jinger Neighbors, CPhT-Adv Oncology Pharmacy Patient Advocate [facility] Direct Number: [phone]  Fax: [phone]

## 2023-02-20 NOTE — Telephone Encounter (Signed)
Oral Oncology Patient Advocate Encounter   Was successful in obtaining a copay card for Xpovio.  This copay card will make the patients copay $5.  The billing information is as follows and has been shared with [facility].   RxBin: [id number] PCN: [id number] Member ID: [id number] Group ID: [id number]   Jinger Neighbors, CPhT-Adv Oncology Pharmacy Patient Advocate [facility] Direct Number: [phone]  Fax: [phone]

## 2023-02-20 NOTE — Progress Notes (Signed)
[facility] [phone]   [phone]  OFFICE PROGRESS NOTE  [name], MD [address] [place] [address]  DIAGNOSIS: Plasma cell dyscrasia initially diagnosed as MGUS in September 2010, with additional symptoms suggestive of POEMS syndrome.   PRIOR THERAPY:  1) Velcade 1.3 MG/M2 subcutaneously with Decadron 40 mg by mouth on a weekly basis. First cycle 11/24/2013. She status post 31 weekly doses of treatment. 2) Velcade 1.3 MG/M2 subcutaneously and weekly basis with Decadron 40 mg by mouth weekly. First dose 02/01/2015. Status post 28 cycles. 3) Revlimid 25 mg by mouth daily for 21 days every 4 weeks with weekly Decadron 20 mg. started in 11/27/2015. Status post 3 cycles discontinued secondary to lack of response. 4) Systemic treatment with Velcade 1.3 MG/KG weekly, Revlimid 25 mg by mouth daily for 21 days every 4 weeks in addition to Decadron 20 mg by mouth weekly. First dose 03/06/2016. Status post 42 cycles.  She has a break off treatment from June 2021 until July 2022. 5) Second line treatment with daratumumab, Pomalyst 3 mg for 21 days every 4 weeks as well as Decadron 20 mg p.o. weekly.  First dose 09/26/2022.  Status post 1 cycle and currently receiving day 15 of cycle #2.  CURRENT THERAPY: Third treatment with selinexor, carfilzomib and dex  28-day cycle. Carfilzomib 56 mg/m2 IV over 30 minutes on days 1, 8 and 15 (20 mg/m2 with cycle 1, day 1). Escalate to 70 mg/m2 with cycle 2 or 3 as tolerated. Selinexor 60 mg PO on days 1, 8, 15 and 22. Escalate to 80 mg weekly as tolerated. Dex 20 mg PO on days 1, 8, 15 and 22.   INTERVAL HISTORY: Brenda Conner 63 y.o. female returns to the clinic today for follow-up visit.Discussed the use of AI scribe software for clinical note transcription with the patient, who gave verbal consent to proceed.  History of Present Illness   Brenda Conner is a 63 year old female with multiple  myeloma who presents for follow-up after disease progression on previous treatment.  She has a history of multiple myeloma diagnosed in September 2010 and has undergone various treatments including Revlimid, Velcade, daratumumab, Pomalyst, and Decadron. Her disease has recently progressed on the latest regimen, leading to its discontinuation.  She experiences fatigue, which she attributes to anemia. Despite a break from treatment, her anemia persists, although her platelet levels have recovered. No blood in stool, epistaxis, gum bleeding, or significant bleeding from hemorrhoids. She had a normal colonoscopy and endoscopy in 2023. Occasionally has hemorrhoids without significant bleeding. Currently taking Aciclovir and previously took iron supplements, which were discontinued due to adverse effects.        MEDICAL HISTORY: Past Medical History:  Diagnosis Date   Acid reflux    Anemia    Anxiety    Asthma    Cancer (HCC)    waldenstroms/ macroglobinulemia   Depression    Depression    Diabetes mellitus without complication (HCC)    Dysuria 02/28/2016   Gallstones    Heart palpitations    Hypercholesteremia    Hypertension    Hypothyroidism    Macroglobulinemia    ? POEMS syndrome   Monoclonal gammopathy of unknown significance (MGUS)    Multiple myeloma (HCC)    Obesity    POEMS syndrome    PONV (postoperative nausea and vomiting)    Sleep apnea    CPAP at  bedtime    ALLERGIES:  is allergic to codeine, hydrocodone, lortab [hydrocodone-acetaminophen], onion, shellfish allergy, and amoxicillin.  MEDICATIONS:  Current Outpatient Medications  Medication Sig Dispense Refill   ACCU-CHEK AVIVA PLUS test strip USE TO CHECK SUGAR TWICE A DAY 90     acyclovir (ZOVIRAX) 400 MG tablet Take 1 tablet (400 mg total) by mouth 2 (two) times daily. 60 tablet 11   ALPRAZolam (XANAX) 0.5 MG tablet Take 1 tablet (0.5 mg total) by mouth at bedtime as needed for anxiety. 30 tablet 0   aspirin  81 MG chewable tablet Chew by mouth daily.     Azelastine HCl 0.15 % SOLN as needed.     B-D UF III MINI PEN NEEDLES 31G X 5 MM MISC      Blood Glucose Monitoring Suppl (ONE TOUCH ULTRA MINI) W/DEVICE KIT See admin instructions. Reported on 01/25/2015  0   cetirizine (ZYRTEC ALLERGY) 10 MG tablet Take 1 tablet by mouth daily.     Continuous Glucose Sensor (FREESTYLE LIBRE 3 SENSOR) MISC USE 1 SENSOR EVERY 14 DAYS     dexamethasone (DECADRON) 4 MG tablet Take 5 tablets (20 mg total) by mouth once a week. Take the day after darzalex faspro. Take with breakfast. 20 tablet 11   DiphenhydrAMINE HCl (BENADRYL ALLERGY PO) Take 1 Dose by mouth daily at 6 (six) AM.     fexofenadine (ALLEGRA ALLERGY) 180 MG tablet Take 1 tablet by mouth daily.     Fluticasone-Salmeterol (ADVAIR) 100-50 MCG/DOSE AEPB Inhale 2 puffs into the lungs every 12 (twelve) hours.     glucosamine-chondroitin 500-400 MG tablet Take 1 tablet by mouth daily.     ibuprofen (ADVIL,MOTRIN) 100 MG tablet Take 100 mg by mouth every 6 (six) hours as needed. Reported on 05/31/2015     Insulin Glargine (BASAGLAR KWIKPEN) 100 UNIT/ML Inject 20 Units into the skin at bedtime.     levothyroxine (SYNTHROID) 137 MCG tablet Take 137 mcg by mouth daily.     lisinopril (PRINIVIL,ZESTRIL) 10 MG tablet Take 10 mg by mouth daily. auth number 05/29/2018 [id number]  3   loperamide (IMODIUM) 2 MG capsule Take 2 mg by mouth as needed for diarrhea or loose stools.     magic mouthwash w/lidocaine SOLN Take 5 mLs by mouth 4 (four) times daily as needed for mouth pain. Swish, Gargle, and spit 240 mL 1   Melatonin 5 MG CAPS Take 1 capsule by mouth at bedtime.     metFORMIN (GLUCOPHAGE-XR) 500 MG 24 hr tablet Take 500 mg by mouth daily.     NOVOLOG FLEXPEN 100 UNIT/ML FlexPen Inject 15 Units into the skin in the morning, at noon, and at bedtime. Per Sliding Scale     omeprazole (PRILOSEC) 20 MG capsule Take 20 mg by mouth 2 (two) times daily.     ondansetron (ZOFRAN) 8  MG tablet Take 1 tablet (8 mg total) by mouth every 8 (eight) hours as needed for nausea or vomiting. 30 tablet 1   pravastatin (PRAVACHOL) 80 MG tablet Take 80 mg by mouth daily.     PROAIR HFA 108 (90 Base) MCG/ACT inhaler Reported on 06/21/2015  1   prochlorperazine (COMPAZINE) 10 MG tablet Take 1 tablet (10 mg total) by mouth every 6 (six) hours as needed for nausea or vomiting. 30 tablet 1   sertraline (ZOLOFT) 50 MG tablet TAKE 3 TABLETS BY MOUTH EVERY DAY 270 tablet 1   verapamil (CALAN-SR) 240 MG CR tablet Take 240 mg by mouth  2 (two) times daily.     No current facility-administered medications for this visit.    SURGICAL HISTORY:  Past Surgical History:  Procedure Laterality Date   ABLATION  09/09/2007   HTA and polyp resection   BONE MARROW BIOPSY  04/08/2012   BONE MARROW BIOPSY  01/2020   CATARACT EXTRACTION, BILATERAL Bilateral 02/2022   FOOT SURGERY Bilateral 01/09/1996   small toe   KNEE ARTHROSCOPY W/ MENISCAL REPAIR Bilateral 10/10 ; 3/11   LAPAROSCOPIC CHOLECYSTECTOMY  01/09/1995   LUMBAR DISC SURGERY  03/08/2004   herniation, L4-L5   NASAL SINUS SURGERY  01/08/2002   UTERINE FIBROID EMBOLIZATION  2009   HTA and polyp resection    REVIEW OF SYSTEMS:  Constitutional: positive for fatigue Eyes: negative Ears, nose, mouth, throat, and face: negative Respiratory: negative Cardiovascular: negative Gastrointestinal: negative Genitourinary:negative Integument/breast: negative Hematologic/lymphatic: negative Musculoskeletal:negative Neurological: negative Behavioral/Psych: negative Endocrine: negative Allergic/Immunologic: negative   PHYSICAL EXAMINATION: General appearance: alert, cooperative, fatigued, and no distress Head: Normocephalic, without obvious abnormality, atraumatic Neck: no adenopathy Lymph nodes: Cervical, supraclavicular, and axillary nodes normal. Resp: clear to auscultation bilaterally Back: symmetric, no curvature. ROM normal. No CVA  tenderness. Cardio: regular rate and rhythm, S1, S2 normal, no murmur, click, rub or gallop GI: soft, non-tender; bowel sounds normal; no masses,  no organomegaly Extremities: extremities normal, atraumatic, no cyanosis or edema Neurologic: Alert and oriented X 3, normal strength and tone. Normal symmetric reflexes. Normal coordination and gait  ECOG PERFORMANCE STATUS: 1 - Symptomatic but completely ambulatory  Blood pressure 134/76, pulse 70, temperature 98.2 F (36.8 C), temperature source Tympanic, resp. rate 18, height 5\' 7"  (1.702 m), weight 257 lb (116.6 kg), SpO2 98%.  LABORATORY DATA: Lab Results  Component Value Date   WBC 4.2 02/20/2023   HGB 8.9 (L) 02/20/2023   HCT 30.0 (L) 02/20/2023   MCV 83.3 02/20/2023   PLT 113 (L) 02/20/2023      Chemistry      Component Value Date/Time   NA 138 02/20/2023 1033   NA 138 11/14/2016 0824   K 4.3 02/20/2023 1033   K 4.3 11/14/2016 0824   CL 104 02/20/2023 1033   CL 104 04/07/2012 1415   CO2 28 02/20/2023 1033   CO2 23 11/14/2016 0824   BUN 9 02/20/2023 1033   BUN 9.1 11/14/2016 0824   CREATININE 0.75 02/20/2023 1033   CREATININE 0.7 11/14/2016 0824      Component Value Date/Time   CALCIUM 9.2 02/20/2023 1033   CALCIUM 9.6 11/14/2016 0824   ALKPHOS 146 (H) 02/20/2023 1033   ALKPHOS 94 11/14/2016 0824   AST 29 02/20/2023 1033   AST 28 11/14/2016 0824   ALT 16 02/20/2023 1033   ALT 24 11/14/2016 0824   BILITOT 0.6 02/20/2023 1033   BILITOT 0.41 11/14/2016 0824      ASSESSMENT AND PLAN:  This is a very pleasant 63 years old white female with multiple myeloma with questionable POEMS syndrome symptom.  The patient has been on treatment with subcutaneous weekly Velcade as well as Revlimid and Decadron status post 34 cycles.  She has been tolerating this treatment well. The patient has been on observation for the last several months and she has been doing fine with no concerning issues except for the recent upper  respiratory infection and viral gastroenteritis. Her myeloma panel showed continuous increase in her free lambda light chain. The patient had a bone marrow biopsy and aspirate recently that showed 3-5% plasma cells still suspicious  for plasma cell dyscrasia.  The skeletal bone survey was negative for any lytic lesions. The patient has been off treatment for more than a year between June 2021 until July 2022. She had repeat myeloma panel that showed significant worsening and increase of her lambda light chain.  She continues to have anemia but no renal insufficiency or hypercalcemia. She resumed her treatment with Velcade, Revlimid and Decadron on July 19, 2020.   The patient is currently on observation and she is feeling fine with no concerning complaints. She was seen recently by Dr. Marissa Calamity at [facility] for second opinion and after extensive investigation is recommended for her to resume her treatment for the multiple myeloma with daratumumab, Pomalyst 3 Mg p.o. daily for 21 days every 4 weeks and Decadron 20 mg weekly.  She is status post 2 cycles.  Her treatment was discontinued secondary to disease progression. The patient will start third line treatment with selinexor, carfilzomib as well as Decadron next week.     Multiple Myeloma Brenda Conner, diagnosed with multiple myeloma in September 2010, has undergone treatments including Revlimid, Velcade, daratumumab, Pomalyst, and Decadron. Her disease progressed on the recent regimen. She is currently anemic but otherwise asymptomatic. After consultation with Dr. Marissa Calamity at [facility], a new regimen with Selinexor (oral), carfilzomib (infusion), and Decadron was recommended. Discussed pharmacy approval for Selinexor and logistics of carfilzomib infusions. Patient consented to the new treatment plan. - Initiate Selinexor (oral), carfilzomib (infusion), and Decadron regimen - Coordinate pharmacy approval for Selinexor - Rebecca  (pharmacist) to educate patient on Selinexor - Administer carfilzomib infusion at the hospital - Continue Aciclovir - Schedule follow-up in two weeks to assess treatment response  Anemia Persistent anemia likely secondary to multiple myeloma and previous treatments. No active bleeding. Intolerance to iron supplements noted. - Recommend iron supplements every other day with orange juice or vitamin C  General Health Maintenance Colonoscopy and endoscopy in 2023 with normal results. - No additional general health maintenance actions required at this time.   The patient will come back for follow-up visit in 2 weeks for evaluation and management of any adverse effect of her treatment. She was advised to call immediately if she has any other concerning symptoms in the interval. All questions were answered. The patient knows to call the clinic with any problems, questions or concerns. We can certainly see the patient much sooner if necessary.  Disclaimer: This note was dictated with voice recognition software. Similar sounding words can inadvertently be transcribed and may not be corrected upon review.

## 2023-02-20 NOTE — Telephone Encounter (Addendum)
Oral Oncology Patient Advocate Encounter  Prior Authorization for [name] has been approved.    PA# [id number] Effective dates: 02/20/23 through 02/20/24  Patients co-pay is $9,501.79.   Medication is limited distribution and will be filled through [facility]   Jinger Neighbors, CPhT-Adv Oncology Pharmacy Patient Advocate [facility] Direct Number: [phone]  Fax: [phone]

## 2023-02-21 ENCOUNTER — Telehealth: Payer: Self-pay | Admitting: Pharmacy Technician

## 2023-02-21 ENCOUNTER — Other Ambulatory Visit: Payer: Self-pay

## 2023-02-21 ENCOUNTER — Telehealth: Payer: Self-pay | Admitting: Internal Medicine

## 2023-02-21 NOTE — Telephone Encounter (Signed)
Oral Oncology Patient Advocate Encounter  Submitted clinical criteria questions to [facility] as requested by pharmacy for processing. Submitted via e-fax [phone].  Jinger Neighbors, CPhT-Adv Oncology Pharmacy Patient Advocate [facility] Direct Number: [phone]  Fax: [phone]

## 2023-02-22 ENCOUNTER — Encounter: Payer: Self-pay | Admitting: Physician Assistant

## 2023-02-22 ENCOUNTER — Other Ambulatory Visit: Payer: Self-pay | Admitting: Physician Assistant

## 2023-02-22 MED ORDER — OLANZAPINE 5 MG PO TABS: ORAL_TABLET | ORAL | 2 refills | Status: DC

## 2023-02-25 ENCOUNTER — Inpatient Hospital Stay: Payer: 59

## 2023-02-25 ENCOUNTER — Other Ambulatory Visit: Payer: Self-pay | Admitting: Internal Medicine

## 2023-02-25 ENCOUNTER — Encounter: Payer: Self-pay | Admitting: Physician Assistant

## 2023-02-25 ENCOUNTER — Other Ambulatory Visit: Payer: Self-pay | Admitting: Medical Oncology

## 2023-02-25 ENCOUNTER — Encounter: Payer: Self-pay | Admitting: Internal Medicine

## 2023-02-25 VITALS — BP 154/82 | HR 70 | Temp 98.0°F | Resp 16

## 2023-02-25 DIAGNOSIS — I878 Other specified disorders of veins: Secondary | ICD-10-CM

## 2023-02-25 DIAGNOSIS — Z5112 Encounter for antineoplastic immunotherapy: Secondary | ICD-10-CM | POA: Diagnosis not present

## 2023-02-25 DIAGNOSIS — E8809 Other disorders of plasma-protein metabolism, not elsewhere classified: Secondary | ICD-10-CM

## 2023-02-25 DIAGNOSIS — C9000 Multiple myeloma not having achieved remission: Secondary | ICD-10-CM

## 2023-02-25 LAB — CBC WITH DIFFERENTIAL (CANCER CENTER ONLY)
Abs Immature Granulocytes: 0.01 10*3/uL (ref 0.00–0.07)
Basophils Absolute: 0 10*3/uL (ref 0.0–0.1)
Basophils Relative: 1 %
Eosinophils Absolute: 0.1 10*3/uL (ref 0.0–0.5)
Eosinophils Relative: 2 %
HCT: 30.5 % — ABNORMAL LOW (ref 36.0–46.0)
Hemoglobin: 9.1 g/dL — ABNORMAL LOW (ref 12.0–15.0)
Immature Granulocytes: 0 %
Lymphocytes Relative: 20 %
Lymphs Abs: 1.1 10*3/uL (ref 0.7–4.0)
MCH: 24.3 pg — ABNORMAL LOW (ref 26.0–34.0)
MCHC: 29.8 g/dL — ABNORMAL LOW (ref 30.0–36.0)
MCV: 81.6 fL (ref 80.0–100.0)
Monocytes Absolute: 0.4 10*3/uL (ref 0.1–1.0)
Monocytes Relative: 8 %
Neutro Abs: 3.8 10*3/uL (ref 1.7–7.7)
Neutrophils Relative %: 69 %
Platelet Count: 131 10*3/uL — ABNORMAL LOW (ref 150–400)
RBC: 3.74 MIL/uL — ABNORMAL LOW (ref 3.87–5.11)
RDW: 15.2 % (ref 11.5–15.5)
WBC Count: 5.5 10*3/uL (ref 4.0–10.5)
nRBC: 0 % (ref 0.0–0.2)

## 2023-02-25 LAB — CMP (CANCER CENTER ONLY)
ALT: 18 U/L (ref 0–44)
AST: 32 U/L (ref 15–41)
Albumin: 4.3 g/dL (ref 3.5–5.0)
Alkaline Phosphatase: 139 U/L — ABNORMAL HIGH (ref 38–126)
Anion gap: 9 (ref 5–15)
BUN: 9 mg/dL (ref 8–23)
CO2: 24 mmol/L (ref 22–32)
Calcium: 9.2 mg/dL (ref 8.9–10.3)
Chloride: 104 mmol/L (ref 98–111)
Creatinine: 0.77 mg/dL (ref 0.44–1.00)
GFR, Estimated: >60 mL/min (ref >60)
Glucose, Bld: 117 mg/dL — ABNORMAL HIGH (ref 70–99)
Potassium: 4.2 mmol/L (ref 3.5–5.1)
Sodium: 137 mmol/L (ref 135–145)
Total Bilirubin: 0.6 mg/dL (ref 0.0–1.2)
Total Protein: 6.7 g/dL (ref 6.5–8.1)

## 2023-02-25 MED ORDER — DEXTROSE 5 % IV SOLN
56.0000 mg/m2 | Freq: Once | INTRAVENOUS | Status: AC
  Administered 2023-02-25: 120 mg via INTRAVENOUS
  Filled 2023-02-25: qty 60

## 2023-02-25 MED ORDER — SODIUM CHLORIDE 0.9 % IV SOLN: Freq: Once | INTRAVENOUS | Status: AC

## 2023-02-25 NOTE — Patient Instructions (Signed)
 [facility] - A DEPT OF [facility]  Discharge Instructions: Thank you for choosing [facility] to provide your oncology and hematology care.   If you have a lab appointment with the Cancer Center, please go directly to the Cancer Center and check in at the registration area.   Wear comfortable clothing and clothing appropriate for easy access to any Portacath or PICC line.   We strive to give you quality time with your provider. You may need to reschedule your appointment if you arrive late (15 or more minutes).  Arriving late affects you and other patients whose appointments are after yours.  Also, if you miss three or more appointments without notifying the office, you may be dismissed from the clinic at the provider's discretion.      For prescription refill requests, have your pharmacy contact our office and allow 72 hours for refills to be completed.    Today you received the following chemotherapy and/or immunotherapy agents: carfilzomib      To help prevent nausea and vomiting after your treatment, we encourage you to take your nausea medication as directed.  BELOW ARE SYMPTOMS THAT SHOULD BE REPORTED IMMEDIATELY: *FEVER GREATER THAN 100.4 F (38 C) OR HIGHER *CHILLS OR SWEATING *NAUSEA AND VOMITING THAT IS NOT CONTROLLED WITH YOUR NAUSEA MEDICATION *UNUSUAL SHORTNESS OF BREATH *UNUSUAL BRUISING OR BLEEDING *URINARY PROBLEMS (pain or burning when urinating, or frequent urination) *BOWEL PROBLEMS (unusual diarrhea, constipation, pain near the anus) TENDERNESS IN MOUTH AND THROAT WITH OR WITHOUT PRESENCE OF ULCERS (sore throat, sores in mouth, or a toothache) UNUSUAL RASH, SWELLING OR PAIN  UNUSUAL VAGINAL DISCHARGE OR ITCHING   Items with * indicate a potential emergency and should be followed up as soon as possible or go to the Emergency Department if any problems should occur.  Please show the CHEMOTHERAPY ALERT CARD or  IMMUNOTHERAPY ALERT CARD at check-in to the Emergency Department and triage nurse.  Should you have questions after your visit or need to cancel or reschedule your appointment, please contact [facility] - A DEPT OF [facility]  Dept: [phone]  and follow the prompts.  Office hours are 8:00 a.m. to 4:30 p.m. Monday - Friday. Please note that voicemails left after 4:00 p.m. may not be returned until the following business day.  We are closed weekends and major holidays. You have access to a nurse at all times for urgent questions. Please call the main number to the clinic Dept: [phone] and follow the prompts.   For any non-urgent questions, you may also contact your provider using MyChart. We now offer e-Visits for anyone 73 and older to request care online for non-urgent symptoms. For details visit [email or web address].   Also download the MyChart app! Go to the app store, search "MyChart", open the app, select [place], and log in with your MyChart username and password.

## 2023-02-26 ENCOUNTER — Encounter: Payer: Self-pay | Admitting: Internal Medicine

## 2023-02-26 NOTE — Telephone Encounter (Signed)
-----   Message from Nurse Guilford Shi sent at 02/25/2023 11:10 AM EST ----- Regarding: FT Chemo- mohamed Dr. Arbutus Ped. First time IV chemo (kyprolis). Has had velcade/darzalex faspro in past. Tolerated very well. Also started oral chemo today.  Happy Monday!

## 2023-02-27 ENCOUNTER — Encounter: Payer: Self-pay | Admitting: Internal Medicine

## 2023-02-28 ENCOUNTER — Other Ambulatory Visit: Payer: Self-pay | Admitting: Physician Assistant

## 2023-02-28 ENCOUNTER — Other Ambulatory Visit: Payer: Self-pay | Admitting: Internal Medicine

## 2023-02-28 ENCOUNTER — Encounter: Payer: Self-pay | Admitting: Internal Medicine

## 2023-03-01 NOTE — Progress Notes (Signed)
 [facility] OFFICE PROGRESS NOTE  Brenda Rocks, MD [address] [place] [address]  DIAGNOSIS: Plasma cell dyscrasia initially diagnosed as MGUS in September 2010, with additional symptoms suggestive of POEMS syndrome.   PRIOR THERAPY: 1) Velcade 1.3 MG/M2 subcutaneously with Decadron 40 mg by mouth on a weekly basis. First cycle 11/24/2013. She status post 31 weekly doses of treatment. 2) Velcade 1.3 MG/M2 subcutaneously and weekly basis with Decadron 40 mg by mouth weekly. First dose 02/01/2015. Status post 28 cycles. 3) Revlimid 25 mg by mouth daily for 21 days every 4 weeks with weekly Decadron 20 mg. started in 11/27/2015. Status post 3 cycles discontinued secondary to lack of response. 4) Systemic treatment with Velcade 1.3 MG/KG weekly, Revlimid 25 mg by mouth daily for 21 days every 4 weeks in addition to Decadron 20 mg by mouth weekly. First dose 03/06/2016. Status post 42 cycles.  She has a break off treatment from June 2021 until July 2022. 5) Second line treatment with daratumumab, Pomalyst 3 mg for 21 days every 4 weeks as well as Decadron 20 mg p.o. weekly.  First dose 09/26/2022.  Status post 1 cycle and currently receiving day 15 of cycle #2.  CURRENT THERAPY: Third treatment with selinexor, carfilzomib and dex  28-day cycle. Carfilzomib 56 mg/m2 IV over 30 minutes on days 1, 8 and 15 (20 mg/m2 with cycle 1, day 1). Escalate to 70 mg/m2 with cycle 2 or 3 as tolerated. Selinexor 60 mg PO on days 1, 8, 15 and 22. Escalate to 80 mg weekly as tolerated. Dex 20 mg PO on days 1, 8, 15 and 22.   INTERVAL HISTORY: Brenda Conner 63 y.o. female returns to the clinic today for a follow-up visit.  The patient was last seen in clinic by Dr. Arbutus Ped on 02/20/2023.  The patient was recently found to have progression of her multiple myeloma.  Therefore, when she saw Dr. Arbutus Ped at her last appointment the patient had had her consultation with Dr. Marissa Calamity at [facility]  and he recommended treatment with selinexor, carfilzomib and dex  28-day cycle. Carfilzomib 56 mg/m2 IV over 30 minutes on days 1, 8 and 15 (20 mg/m2 with cycle 1, day 1).  She started this last week and overall tolerated it fair. She did have some fatigue and nausea without vomiting. Wednesday last week she noticed the most symptoms. She has a prescription for zyprexa and zofran. She also struggles with chronic diarrhea but did have some more diarrhea than usual. She did eventually use imodium.  She drinks a lot of water. She takes xanax for anxiety at baseline but felt a little more anxious last week and is wondering if this is from her treatment. She also notices she gets more winded with exertion and is wondering if this is from her anemia. She started taking an iron yesterday.   Today she denies any fever, chills, or unexplained weight loss. Her appetite is "on and off". Denies any signs and symptoms of infection including sore throat, cough, or skin infections. Denies any abnormal bleeding or bruising.  She is here today for evaluation repeat blood work before undergoing day 8 cycle 1.  MEDICAL HISTORY: Past Medical History:  Diagnosis Date   Acid reflux    Anemia    Anxiety    Asthma    Cancer (HCC)    waldenstroms/ macroglobinulemia   Depression    Depression    Diabetes mellitus without complication (HCC)  Dysuria 02/28/2016   Gallstones    Heart palpitations    Hypercholesteremia    Hypertension    Hypothyroidism    Macroglobulinemia    ? POEMS syndrome   Monoclonal gammopathy of unknown significance (MGUS)    Multiple myeloma (HCC)    Obesity    POEMS syndrome    PONV (postoperative nausea and vomiting)    Sleep apnea    CPAP at bedtime    ALLERGIES:  is allergic to codeine, hydrocodone, lortab [hydrocodone-acetaminophen], onion, shellfish allergy, and amoxicillin.  MEDICATIONS:  Current Outpatient Medications  Medication Sig Dispense Refill   lidocaine-prilocaine  (EMLA) cream Apply 1 Application topically as needed. 30 g 2   ACCU-CHEK AVIVA PLUS test strip USE TO CHECK SUGAR TWICE A DAY 90     acyclovir (ZOVIRAX) 400 MG tablet Take 1 tablet (400 mg total) by mouth 2 (two) times daily. 60 tablet 11   acyclovir (ZOVIRAX) 400 MG tablet Take 1 tablet (400 mg total) by mouth daily. 30 tablet 3   ALPRAZolam (XANAX) 0.5 MG tablet Take 1 tablet (0.5 mg total) by mouth at bedtime as needed for anxiety. 30 tablet 0   aspirin 81 MG chewable tablet Chew by mouth daily.     Azelastine HCl 0.15 % SOLN as needed.     B-D UF III MINI PEN NEEDLES 31G X 5 MM MISC      Blood Glucose Monitoring Suppl (ONE TOUCH ULTRA MINI) W/DEVICE KIT See admin instructions. Reported on 01/25/2015  0   cetirizine (ZYRTEC ALLERGY) 10 MG tablet Take 1 tablet by mouth daily.     Continuous Glucose Sensor (FREESTYLE LIBRE 3 SENSOR) MISC USE 1 SENSOR EVERY 14 DAYS     dexamethasone (DECADRON) 4 MG tablet Take 5 tablets (20 mg total) by mouth once a week. Take the day after darzalex faspro. Take with breakfast. 20 tablet 11   dexamethasone (DECADRON) 4 MG tablet Take 5 tablets (20mg ) on days 1, 8, 15 and 22 every 28 days 20 tablet 4   DiphenhydrAMINE HCl (BENADRYL ALLERGY PO) Take 1 Dose by mouth daily at 6 (six) AM.     fexofenadine (ALLEGRA ALLERGY) 180 MG tablet Take 1 tablet by mouth daily.     Fluticasone-Salmeterol (ADVAIR) 100-50 MCG/DOSE AEPB Inhale 2 puffs into the lungs every 12 (twelve) hours.     glucosamine-chondroitin 500-400 MG tablet Take 1 tablet by mouth daily.     ibuprofen (ADVIL,MOTRIN) 100 MG tablet Take 100 mg by mouth every 6 (six) hours as needed. Reported on 05/31/2015     Insulin Glargine (BASAGLAR KWIKPEN) 100 UNIT/ML Inject 20 Units into the skin at bedtime.     levothyroxine (SYNTHROID) 137 MCG tablet Take 137 mcg by mouth daily.     lisinopril (PRINIVIL,ZESTRIL) 10 MG tablet Take 10 mg by mouth daily. auth number 05/29/2018 [id number]  3   loperamide (IMODIUM) 2 MG  capsule Take 2 mg by mouth as needed for diarrhea or loose stools.     magic mouthwash w/lidocaine SOLN Take 5 mLs by mouth 4 (four) times daily as needed for mouth pain. Swish, Gargle, and spit 240 mL 1   Melatonin 5 MG CAPS Take 1 capsule by mouth at bedtime.     metFORMIN (GLUCOPHAGE-XR) 500 MG 24 hr tablet Take 500 mg by mouth daily.     NOVOLOG FLEXPEN 100 UNIT/ML FlexPen Inject 15 Units into the skin in the morning, at noon, and at bedtime. Per Sliding Scale     OLANZapine (  ZYPREXA) 5 MG tablet Take one tablet the day before, the day of, and the day after selinexor. 30 tablet 2   omeprazole (PRILOSEC) 20 MG capsule Take 20 mg by mouth 2 (two) times daily.     ondansetron (ZOFRAN) 8 MG tablet Take 1 tablet (8 mg total) by mouth every 8 (eight) hours as needed for nausea or vomiting. 30 tablet 1   ondansetron (ZOFRAN) 8 MG tablet Take 1 tablet (8 mg total) by mouth every 8 (eight) hours as needed for nausea or vomiting. 30 tablet 1   pravastatin (PRAVACHOL) 80 MG tablet Take 80 mg by mouth daily.     PROAIR HFA 108 (90 Base) MCG/ACT inhaler Reported on 06/21/2015  1   prochlorperazine (COMPAZINE) 10 MG tablet Take 1 tablet (10 mg total) by mouth every 6 (six) hours as needed for nausea or vomiting. 30 tablet 1   selinexor (XPOVIO) Therapy Pack (60 mg once weekly) Take 1 tablet (60 mg total) by mouth once a week. 4 tablet 0   sertraline (ZOLOFT) 50 MG tablet TAKE 3 TABLETS BY MOUTH EVERY DAY 270 tablet 1   verapamil (CALAN-SR) 240 MG CR tablet Take 240 mg by mouth 2 (two) times daily.     No current facility-administered medications for this visit.    SURGICAL HISTORY:  Past Surgical History:  Procedure Laterality Date   ABLATION  09/09/2007   HTA and polyp resection   BONE MARROW BIOPSY  04/08/2012   BONE MARROW BIOPSY  01/2020   CATARACT EXTRACTION, BILATERAL Bilateral 02/2022   FOOT SURGERY Bilateral 01/09/1996   small toe   KNEE ARTHROSCOPY W/ MENISCAL REPAIR Bilateral 10/10 ; 3/11    LAPAROSCOPIC CHOLECYSTECTOMY  01/09/1995   LUMBAR DISC SURGERY  03/08/2004   herniation, L4-L5   NASAL SINUS SURGERY  01/08/2002   UTERINE FIBROID EMBOLIZATION  2009   HTA and polyp resection    REVIEW OF SYSTEMS:   Review of Systems  Constitutional: Positive for fatigue and diminished appetite.  Negative for chills, fever and unexpected weight change.  HENT: Negative for mouth sores, nosebleeds, sore throat and trouble swallowing.   Eyes: Negative for eye problems and icterus.  Respiratory: Positive for dyspnea on exertion. Negative for cough, hemoptysis, and wheezing.   Cardiovascular: Negative for chest pain and leg swelling.  Gastrointestinal: Positive for chronic diarrhea and nausea. Negative for abdominal pain, constipation, and vomiting.  Genitourinary: Negative for bladder incontinence, difficulty urinating, dysuria, frequency and hematuria.   Musculoskeletal: Negative for back pain, gait problem, neck pain and neck stiffness.  Skin: Negative for itching and rash.  Neurological: Negative for dizziness, extremity weakness, gait problem, headaches, light-headedness and seizures.  Hematological: Negative for adenopathy. Does not bruise/bleed easily.  Psychiatric/Behavioral: Negative for confusion, depression and sleep disturbance. The patient is not nervous/anxious.     PHYSICAL EXAMINATION:  Blood pressure (!) 128/58, temperature 98.1 F (36.7 C), temperature source Tympanic, height 5\' 7"  (1.702 m), weight 260 lb 11.2 oz (118.3 kg), SpO2 100%.  ECOG PERFORMANCE STATUS: 1  Physical Exam  Constitutional: Oriented to person, place, and time and well-developed, well-nourished, and in no distress.  HENT:  Head: Normocephalic and atraumatic.  Mouth/Throat: Oropharynx is clear and moist. No oropharyngeal exudate.  Eyes: Conjunctivae are normal. Right eye exhibits no discharge. Left eye exhibits no discharge. No scleral icterus.  Neck: Normal range of motion. Neck supple.   Cardiovascular: Normal rate, regular rhythm, normal heart sounds and intact distal pulses.   Pulmonary/Chest: Effort normal and  breath sounds normal. No respiratory distress. No wheezes. No rales.  Abdominal: Soft. Bowel sounds are normal. Exhibits no distension and no mass. There is no tenderness.  Musculoskeletal: Normal range of motion. Exhibits no edema.  Lymphadenopathy:    No cervical adenopathy.  Neurological: Alert and oriented to person, place, and time. Exhibits normal muscle tone. Gait normal. Coordination normal.  Skin: Skin is warm and dry. No rash noted. Not diaphoretic. No erythema. No pallor.  Psychiatric: Mood, memory and judgment normal.  Vitals reviewed.  LABORATORY DATA: Lab Results  Component Value Date   WBC 3.5 (L) 03/04/2023   HGB 8.6 (L) 03/04/2023   HCT 28.7 (L) 03/04/2023   MCV 81.1 03/04/2023   PLT 83 (L) 03/04/2023      Chemistry      Component Value Date/Time   NA 136 03/04/2023 1020   NA 138 11/14/2016 0824   K 4.2 03/04/2023 1020   K 4.3 11/14/2016 0824   CL 107 03/04/2023 1020   CL 104 04/07/2012 1415   CO2 23 03/04/2023 1020   CO2 23 11/14/2016 0824   BUN 7 (L) 03/04/2023 1020   BUN 9.1 11/14/2016 0824   CREATININE 0.74 03/04/2023 1020   CREATININE 0.7 11/14/2016 0824      Component Value Date/Time   CALCIUM 8.6 (L) 03/04/2023 1020   CALCIUM 9.6 11/14/2016 0824   ALKPHOS 143 (H) 03/04/2023 1020   ALKPHOS 94 11/14/2016 0824   AST 27 03/04/2023 1020   AST 28 11/14/2016 0824   ALT 18 03/04/2023 1020   ALT 24 11/14/2016 0824   BILITOT 0.6 03/04/2023 1020   BILITOT 0.41 11/14/2016 0824       RADIOGRAPHIC STUDIES:  No results found.   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 Her myeloma panel showed continuous increase in her free lambda light chain.    The patient previously underwent 107 cycles of subcutaneous weekly Velcade, as  well as Revlimid and Decadron on and off over the last several years.   She had been off treatment since June 2021 until July 2022 in which she had evidence of disease progression.  Therefore, she was restarted on Velcade, Revlimid, and Decadron.  She has restarted treatment in July 2022 with cycle 107 of weekly velcade. She tolerated well without any concerning adverse side effects.  She was then given a break.   She was seen recently by Dr. Marissa Calamity at [facility] for second opinion and after extensive investigation is recommended for her to resume her treatment for the multiple myeloma with daratumumab, Pomalyst 3 Mg p.o. daily for 21 days every 4 weeks and Decadron 20 mg weekly. This was started in September 2024. This was discontinued due to disease progression in December 2024.   She saw multiple myeloma expert, Dr. Ladona Ridgel recently who recommended Selinexor (oral), carfilzomib (infusion), and Decadron was recommended. She started this on 02/25/23.   She took her Decadron and Zofran prior to coming to the clinic today.  Labs were reviewed.  Her platelet count is 83K today.  I did review this with Dr. Arbutus Ped and he recommends that she proceed with treatment today with day 8 cycle #1 as scheduled at the same dose.  I did let the patient know that should she develop any abnormal bleeding or bruising to call the clinic and we can always arrange for same-day lab appointment.  We will check her labs again next week.  She understands that should she have any worsening cytopenias we may have to hold or reduce the dose of day 15 cycle #1.  I adjusted her treatment plan appointment request.  She should have lab appointments on days 1, 8, and 15 of every cycle.  I have added clinic appointment request on day 1 and 15 of every cycle starting from cycle #2.  The patient is going to take her Zofran first thing in the morning to try and stay on top of any nausea.  She has not had any vomiting.  She  also was encouraged to take Imodium should she develop diarrhea to avoid any dehydration or electrolyte abnormalities.  I have added standing orders for weekly sample blood bank should she require any blood transfusions.  She has never required a blood transfusion in the past.  I will add ABO to be drawn next week should she require a blood transfusion in the near future.  He will continue taking his iron supplement.   Labs were reviewed. Recommend she proceed with cycle #1 day 8 today as scheduled.   We will see her back in  before undergoing treatment with day 1 cycle #2.   She will continue to use her xanax for anxiety.   She is scheduled to have a Port-A-Cath placed on 03/08/2023.  I have changed her future lab appointments to lab and flush.  We also reviewed how to use her Emla cream.  I sent a Emla cream prescription to her local pharmacy.  The patient was advised to call immediately if she has any concerning symptoms in the interval. The patient voices understanding of current disease status and treatment options and is in agreement with the current care plan. All questions were answered. The patient knows to call the clinic with any problems, questions or concerns. We can certainly see the patient much sooner if necessary          Orders Placed This Encounter  Procedures   Sample to Blood Bank    Standing Status:   Future    Expected Date:   03/12/2023    Expiration Date:   03/03/2024   ABO/RH    Standing Status:   Future    Expected Date:   03/11/2023    Expiration Date:   03/03/2024    The total time spent in the appointment was 30-39 minutes  Roy Tokarz L Vanden Fawaz, PA-C 03/04/23

## 2023-03-04 ENCOUNTER — Inpatient Hospital Stay: Payer: 59

## 2023-03-04 ENCOUNTER — Encounter: Payer: Self-pay | Admitting: Internal Medicine

## 2023-03-04 ENCOUNTER — Inpatient Hospital Stay: Payer: 59 | Admitting: Physician Assistant

## 2023-03-04 ENCOUNTER — Telehealth: Payer: Self-pay | Admitting: Internal Medicine

## 2023-03-04 VITALS — BP 128/58 | Temp 98.1°F | Ht 67.0 in | Wt 260.7 lb

## 2023-03-04 DIAGNOSIS — Z5112 Encounter for antineoplastic immunotherapy: Secondary | ICD-10-CM | POA: Diagnosis not present

## 2023-03-04 DIAGNOSIS — Z5111 Encounter for antineoplastic chemotherapy: Secondary | ICD-10-CM | POA: Diagnosis not present

## 2023-03-04 DIAGNOSIS — C9000 Multiple myeloma not having achieved remission: Secondary | ICD-10-CM | POA: Diagnosis not present

## 2023-03-04 LAB — CMP (CANCER CENTER ONLY)
ALT: 18 U/L (ref 0–44)
AST: 27 U/L (ref 15–41)
Albumin: 4 g/dL (ref 3.5–5.0)
Alkaline Phosphatase: 143 U/L — ABNORMAL HIGH (ref 38–126)
Anion gap: 6 (ref 5–15)
BUN: 7 mg/dL — ABNORMAL LOW (ref 8–23)
CO2: 23 mmol/L (ref 22–32)
Calcium: 8.6 mg/dL — ABNORMAL LOW (ref 8.9–10.3)
Chloride: 107 mmol/L (ref 98–111)
Creatinine: 0.74 mg/dL (ref 0.44–1.00)
GFR, Estimated: >60 mL/min (ref >60)
Glucose, Bld: 152 mg/dL — ABNORMAL HIGH (ref 70–99)
Potassium: 4.2 mmol/L (ref 3.5–5.1)
Sodium: 136 mmol/L (ref 135–145)
Total Bilirubin: 0.6 mg/dL (ref 0.0–1.2)
Total Protein: 6.5 g/dL (ref 6.5–8.1)

## 2023-03-04 LAB — CBC WITH DIFFERENTIAL (CANCER CENTER ONLY)
Abs Immature Granulocytes: 0.01 10*3/uL (ref 0.00–0.07)
Basophils Absolute: 0 10*3/uL (ref 0.0–0.1)
Basophils Relative: 0 %
Eosinophils Absolute: 0 10*3/uL (ref 0.0–0.5)
Eosinophils Relative: 1 %
HCT: 28.7 % — ABNORMAL LOW (ref 36.0–46.0)
Hemoglobin: 8.6 g/dL — ABNORMAL LOW (ref 12.0–15.0)
Immature Granulocytes: 0 %
Lymphocytes Relative: 21 %
Lymphs Abs: 0.7 10*3/uL (ref 0.7–4.0)
MCH: 24.3 pg — ABNORMAL LOW (ref 26.0–34.0)
MCHC: 30 g/dL (ref 30.0–36.0)
MCV: 81.1 fL (ref 80.0–100.0)
Monocytes Absolute: 0.3 10*3/uL (ref 0.1–1.0)
Monocytes Relative: 9 %
Neutro Abs: 2.4 10*3/uL (ref 1.7–7.7)
Neutrophils Relative %: 69 %
Platelet Count: 83 10*3/uL — ABNORMAL LOW (ref 150–400)
RBC: 3.54 MIL/uL — ABNORMAL LOW (ref 3.87–5.11)
RDW: 15.9 % — ABNORMAL HIGH (ref 11.5–15.5)
WBC Count: 3.5 10*3/uL — ABNORMAL LOW (ref 4.0–10.5)
nRBC: 0 % (ref 0.0–0.2)

## 2023-03-04 MED ORDER — LIDOCAINE-PRILOCAINE 2.5-2.5 % EX CREA
1.0000 | TOPICAL_CREAM | CUTANEOUS | 2 refills | Status: AC | PRN
Start: 2023-03-04 — End: ?

## 2023-03-04 MED ORDER — SODIUM CHLORIDE 0.9 % IV SOLN: INTRAVENOUS | Status: DC

## 2023-03-04 MED ORDER — SODIUM CHLORIDE 0.9 % IV SOLN: Freq: Once | INTRAVENOUS | Status: AC

## 2023-03-04 MED ORDER — DEXTROSE 5 % IV SOLN
56.0000 mg/m2 | Freq: Once | INTRAVENOUS | Status: AC
  Administered 2023-03-04: 120 mg via INTRAVENOUS
  Filled 2023-03-04: qty 60

## 2023-03-04 NOTE — Telephone Encounter (Signed)
 Left the patient a voicemail to call me back if she needs to change any of the scheduled appointment.

## 2023-03-04 NOTE — Progress Notes (Signed)
 Pt confirmed took dex at home around 0915 today

## 2023-03-05 ENCOUNTER — Encounter: Payer: Self-pay | Admitting: Internal Medicine

## 2023-03-06 ENCOUNTER — Encounter (HOSPITAL_BASED_OUTPATIENT_CLINIC_OR_DEPARTMENT_OTHER): Payer: Self-pay | Admitting: *Deleted

## 2023-03-06 ENCOUNTER — Other Ambulatory Visit: Payer: Self-pay

## 2023-03-07 ENCOUNTER — Other Ambulatory Visit: Payer: Self-pay | Admitting: Radiology

## 2023-03-07 NOTE — H&P (Signed)
 Chief Complaint: poor venous access; plasma cell dyscrasia - image guided port a catheter placement   Referring Provider(s): Despina Hick   Supervising Physician: Gilmer Mor  Patient Status: [facility] - Out-pt  History of Present Illness: Brenda Conner is a 63 y.o. female with a history of anemia, multiple myeloma, diabetes mellitus, hypercholesteremia, and hyperthyroidism.  Pt is followed by Dr. Sofie Hartigan with oncology for plasma cell dyscrasia that was initially diagnosed in September of 2010.  She has undergone treatment since initial diagnoses and is currently undergoing senexor/carfilzomib 28 day cycles.  She was referred to interventional radiology for an image guided port a catheter placement for continued treatment.     Patient is Full Code  Past Medical History:  Diagnosis Date   Acid reflux    Anemia    Anxiety    Asthma    Cancer (HCC)    waldenstroms/ macroglobinulemia   Depression    Depression    Diabetes mellitus without complication (HCC)    Dysuria 02/28/2016   Gallstones    Heart palpitations    Hypercholesteremia    Hypertension    Hypothyroidism    Macroglobulinemia    ? POEMS syndrome   Monoclonal gammopathy of unknown significance (MGUS)    Multiple myeloma (HCC)    Obesity    POEMS syndrome    PONV (postoperative nausea and vomiting)    Sleep apnea    CPAP at bedtime    Past Surgical History:  Procedure Laterality Date   ABLATION  09/09/2007   HTA and polyp resection   BONE MARROW BIOPSY  04/08/2012   BONE MARROW BIOPSY  01/2020   CATARACT EXTRACTION, BILATERAL Bilateral 02/2022   FOOT SURGERY Bilateral 01/09/1996   small toe   KNEE ARTHROSCOPY W/ MENISCAL REPAIR Bilateral 10/10 ; 3/11   LAPAROSCOPIC CHOLECYSTECTOMY  01/09/1995   LUMBAR DISC SURGERY  03/08/2004   herniation, L4-L5   NASAL SINUS SURGERY  01/08/2002   UTERINE FIBROID EMBOLIZATION  2009   HTA and polyp resection    Allergies: Codeine, Hydrocodone, Lortab  [hydrocodone-acetaminophen], Onion, Shellfish allergy, and Amoxicillin  Medications: Prior to Admission medications   Medication Sig Start Date End Date Taking? Authorizing Provider  ACCU-CHEK AVIVA PLUS test strip USE TO CHECK SUGAR TWICE A DAY 90 12/12/18   [provider]  acyclovir (ZOVIRAX) 400 MG tablet Take 1 tablet (400 mg total) by mouth 2 (two) times daily. 09/19/22   Si Gaul, MD  acyclovir (ZOVIRAX) 400 MG tablet Take 1 tablet (400 mg total) by mouth daily. 02/20/23   Si Gaul, MD  ALPRAZolam Prudy Feeler) 0.5 MG tablet Take 1 tablet (0.5 mg total) by mouth at bedtime as needed for anxiety. 02/13/23   Jerene Bears, MD  aspirin 81 MG chewable tablet Chew by mouth daily.    [provider]  Azelastine HCl 0.15 % SOLN as needed. 12/18/13   [provider]  B-D UF III MINI PEN NEEDLES 31G X 5 MM MISC  02/17/19   [provider]  Blood Glucose Monitoring Suppl (ONE TOUCH ULTRA MINI) W/DEVICE KIT See admin instructions. Reported on 01/25/2015 02/04/14   [provider]  cetirizine (ZYRTEC ALLERGY) 10 MG tablet Take 1 tablet by mouth daily.    [provider]  Continuous Glucose Sensor (FREESTYLE LIBRE 3 SENSOR) MISC USE 1 SENSOR EVERY 14 DAYS 10/15/22   [provider]  dexamethasone (DECADRON) 4 MG tablet Take 5 tablets (20 mg total) by mouth once a  week. Take the day after darzalex faspro. Take with breakfast. 09/19/22   Si Gaul, MD  dexamethasone (DECADRON) 4 MG tablet Take 5 tablets (20mg ) on days 1, 8, 15 and 22 every 28 days 02/20/23   Si Gaul, MD  DiphenhydrAMINE HCl (BENADRYL ALLERGY PO) Take 1 Dose by mouth daily at 6 (six) AM.    [provider]  fexofenadine (ALLEGRA ALLERGY) 180 MG tablet Take 1 tablet by mouth daily.    [provider]  Fluticasone-Salmeterol (ADVAIR) 100-50 MCG/DOSE AEPB Inhale 2 puffs into the lungs every 12 (twelve) hours.    [provider]   glucosamine-chondroitin 500-400 MG tablet Take 1 tablet by mouth daily.    [provider]  ibuprofen (ADVIL,MOTRIN) 100 MG tablet Take 100 mg by mouth every 6 (six) hours as needed. Reported on 05/31/2015    [provider]  Insulin Glargine (BASAGLAR KWIKPEN) 100 UNIT/ML Inject 20 Units into the skin at bedtime. 03/09/19   [provider]  levothyroxine (SYNTHROID) 137 MCG tablet Take 137 mcg by mouth daily. 08/17/21   [provider]  lidocaine-prilocaine (EMLA) cream Apply 1 Application topically as needed. 03/04/23   Heilingoetter, Cassandra L, PA-C  lisinopril (PRINIVIL,ZESTRIL) 10 MG tablet Take 10 mg by mouth daily. auth number 05/29/2018 [id number] 04/23/15   [provider]  loperamide (IMODIUM) 2 MG capsule Take 2 mg by mouth as needed for diarrhea or loose stools.    [provider]  magic mouthwash w/lidocaine SOLN Take 5 mLs by mouth 4 (four) times daily as needed for mouth pain. Swish, Gargle, and spit 01/28/18   Tanner, Kathrin Greathouse., PA-C  Melatonin 5 MG CAPS Take 1 capsule by mouth at bedtime.    [provider]  metFORMIN (GLUCOPHAGE-XR) 500 MG 24 hr tablet Take 500 mg by mouth daily. 02/05/18   [provider]  NOVOLOG FLEXPEN 100 UNIT/ML FlexPen Inject 15 Units into the skin in the morning, at noon, and at bedtime. Per Sliding Scale 04/05/19   [provider]  OLANZapine (ZYPREXA) 5 MG tablet Take one tablet the day before, the day of, and the day after selinexor. 02/22/23   Heilingoetter, Cassandra L, PA-C  omeprazole (PRILOSEC) 20 MG capsule Take 20 mg by mouth 2 (two) times daily. 08/16/21   [provider]  ondansetron (ZOFRAN) 8 MG tablet Take 1 tablet (8 mg total) by mouth every 8 (eight) hours as needed for nausea or vomiting. 09/19/22   Si Gaul, MD  ondansetron (ZOFRAN) 8 MG tablet Take 1 tablet (8 mg total) by mouth every 8 (eight) hours as needed for nausea or vomiting. 02/20/23   Si Gaul, MD  pravastatin (PRAVACHOL) 80 MG tablet Take 80 mg by mouth daily. 08/28/21   [provider]  PROAIR HFA 108 [phone] Base) MCG/ACT inhaler Reported on 06/21/2015 04/04/15   [provider]  prochlorperazine (COMPAZINE) 10 MG tablet Take 1 tablet (10 mg total) by mouth every 6 (six) hours as needed for nausea or vomiting. 09/19/22   Si Gaul, MD  selinexor (XPOVIO) Therapy Pack (60 mg once weekly) Take 1 tablet (60 mg total) by mouth once a week. 02/20/23   Si Gaul, MD  sertraline (ZOLOFT) 50 MG tablet TAKE 3 TABLETS BY MOUTH EVERY DAY 02/11/23   Lo, Toma Aran, CNM  verapamil (CALAN-SR) 240 MG CR tablet Take 240 mg by mouth 2 (two) times daily.    [provider]     Family History  Problem Relation Age  of Onset   Colon polyps Mother    Stroke Mother    Heart disease Mother        heart cath   Asthma Mother    Endometriosis Mother    Hypertension Mother    Hyperlipidemia Mother    Hyperlipidemia Father    Heart disease Father    Colon polyps Sister    Leukemia Maternal Grandmother    Heart disease Paternal Grandmother    Esophageal cancer Neg Hx    Rectal cancer Neg Hx    Stomach cancer Neg Hx     Social History   Socioeconomic History   Marital status: Single    Spouse name: Not on file   Number of children: 0   Years of education: Not on file   Highest education level: Not on file  Occupational History   Occupation: Chiropodist  Tobacco Use   Smoking status: Never   Smokeless tobacco: Never  Vaping Use   Vaping status: Never Used  Substance and Sexual Activity   Alcohol use: No   Drug use: No   Sexual activity: Not Currently    Birth control/protection: Post-menopausal, Surgical  Other Topics Concern   Not on file  Social History Narrative   Not on file   Social Drivers of Health   Financial Resource Strain: Not on file  Food Insecurity: Low Risk  (06/18/2022)   [facility]   Hunger Vital  Sign    Worried About Running Out of Food in the Last Year: Never true    Ran Out of Food in the Last Year: Never true  Transportation Needs: No Transportation Needs (06/18/2022)   [facility]   Transportation    In the past 12 months, has lack of reliable transportation kept you from medical appointments, meetings, work or from getting things needed for daily living? : No  Physical Activity: Not on file  Stress: Not on file  Social Connections: Not on file     Review of Systems: A 12 point ROS discussed and pertinent positives are indicated in the HPI above.  All other systems are negative.  Review of Systems  Constitutional:  Negative for chills, fatigue and fever.  Respiratory:  Negative for cough, shortness of breath and wheezing.   Gastrointestinal:  Negative for diarrhea, nausea and vomiting.  Neurological:  Negative for dizziness and headaches.  Psychiatric/Behavioral:  Negative for agitation, behavioral problems and confusion.     Vital Signs: BP (!) 142/72   Pulse (!) 55   Temp 98.5 F (36.9 C) (Oral)   Resp 18   Ht 5\' 7"  (1.702 m)   Wt 260 lb 12.9 oz (118.3 kg)   SpO2 99%   BMI 40.85 kg/m   Advance Care Plan: The advanced care place/surrogate decision maker was discussed at the time of visit and the patient did not wish to discuss or was not able to name a surrogate decision maker or provide an advance care plan.  Physical Exam Constitutional:      Appearance: She is well-developed.  HENT:     Head: Atraumatic.     Mouth/Throat:     Mouth: Mucous membranes are moist.  Cardiovascular:     Rate and Rhythm: Normal rate and regular rhythm.     Heart sounds: No murmur heard. Pulmonary:     Effort: Pulmonary effort is normal.     Breath sounds: Normal breath sounds.  Abdominal:  General: Bowel sounds are normal.     Palpations: Abdomen is soft.  Musculoskeletal:        General: Normal range of motion.  Skin:    General: Skin  is warm.  Neurological:     Mental Status: She is alert and oriented to person, place, and time.  Psychiatric:        Mood and Affect: Mood normal.        Behavior: Behavior normal.     Imaging: No results found.  Labs:  CBC: Recent Labs    12/26/22 1037 02/20/23 1033 02/25/23 0838 03/04/23 1020  WBC 2.0* 4.2 5.5 3.5*  HGB 8.3* 8.9* 9.1* 8.6*  HCT 27.1* 30.0* 30.5* 28.7*  PLT 107* 113* 131* 83*    COAGS: No results for input(s): "INR", "APTT" in the last 8760 hours.  BMP: Recent Labs    12/26/22 1037 02/20/23 1033 02/25/23 0838 03/04/23 1020  NA 139 138 137 136  K 4.0 4.3 4.2 4.2  CL 106 104 104 107  CO2 26 28 24 23   GLUCOSE 125* 147* 117* 152*  BUN 8 9 9  7*  CALCIUM 8.9 9.2 9.2 8.6*  CREATININE 0.70 0.75 0.77 0.74  GFRNONAA >60 >60 >60 >60    LIVER FUNCTION TESTS: Recent Labs    12/26/22 1037 02/20/23 1033 02/25/23 0838 03/04/23 1020  BILITOT 0.6 0.6 0.6 0.6  AST 26 29 32 27  ALT 15 16 18 18   ALKPHOS 121 146* 139* 143*  PROT 6.4* 6.8 6.7 6.5  ALBUMIN 4.0 4.2 4.3 4.0    TUMOR MARKERS: No results for input(s): "AFPTM", "CEA", "CA199", "CHROMGRNA" in the last 8760 hours.  Assessment and Plan:  Pt with poor venous access and plasma cell dyscrasia scheduled for image guided port a catheter placement 03/08/23.    Risks and benefits of image guided port-a-catheter placement was discussed with the patient including, but not limited to bleeding, infection, pneumothorax, or fibrin sheath development and need for additional procedures.  All of the patient's questions were answered, patient is agreeable to proceed. Consent signed and in chart.  Thank you for allowing our service to participate in Brenda Conner 's care.  Electronically Signed: Loman Brooklyn, PA-C   03/08/2023, 1:01 PM    I spent a total of  40 Minutes   in face to face in clinical consultation, greater than 50% of which was counseling/coordinating care for image guided port  a catheter placement.

## 2023-03-08 ENCOUNTER — Encounter (HOSPITAL_COMMUNITY): Payer: Self-pay

## 2023-03-08 ENCOUNTER — Other Ambulatory Visit: Payer: Self-pay

## 2023-03-08 ENCOUNTER — Ambulatory Visit (HOSPITAL_COMMUNITY)
Admission: RE | Admit: 2023-03-08 | Discharge: 2023-03-08 | Disposition: A | Discharge status: Home / Self Care | Payer: 59 | Source: Ambulatory Visit | Attending: Internal Medicine | Admitting: Internal Medicine

## 2023-03-08 DIAGNOSIS — E0590 Thyrotoxicosis, unspecified without thyrotoxic crisis or storm: Secondary | ICD-10-CM | POA: Diagnosis not present

## 2023-03-08 DIAGNOSIS — E8809 Other disorders of plasma-protein metabolism, not elsewhere classified: Secondary | ICD-10-CM | POA: Diagnosis present

## 2023-03-08 DIAGNOSIS — I878 Other specified disorders of veins: Secondary | ICD-10-CM | POA: Diagnosis not present

## 2023-03-08 DIAGNOSIS — E119 Type 2 diabetes mellitus without complications: Secondary | ICD-10-CM | POA: Diagnosis not present

## 2023-03-08 DIAGNOSIS — E7800 Pure hypercholesterolemia, unspecified: Secondary | ICD-10-CM | POA: Diagnosis not present

## 2023-03-08 DIAGNOSIS — C9000 Multiple myeloma not having achieved remission: Secondary | ICD-10-CM | POA: Insufficient documentation

## 2023-03-08 HISTORY — PX: IR IMAGING GUIDED PORT INSERTION: IMG5740

## 2023-03-08 LAB — GLUCOSE, CAPILLARY: Glucose-Capillary: 116 mg/dL — ABNORMAL HIGH (ref 70–99)

## 2023-03-08 MED ORDER — FENTANYL CITRATE (PF) 100 MCG/2ML IJ SOLN
INTRAMUSCULAR | Status: AC | PRN
  Administered 2023-03-08 (×2): 50 ug via INTRAVENOUS

## 2023-03-08 MED ORDER — FENTANYL CITRATE (PF) 100 MCG/2ML IJ SOLN
INTRAMUSCULAR | Status: AC
Start: 2023-03-08 — End: ?
  Filled 2023-03-08: qty 2

## 2023-03-08 MED ORDER — SODIUM CHLORIDE 0.9 % IV SOLN: INTRAVENOUS | Status: DC

## 2023-03-08 MED ORDER — HEPARIN SOD (PORK) LOCK FLUSH 100 UNIT/ML IV SOLN
INTRAVENOUS | Status: AC
  Filled 2023-03-08: qty 5

## 2023-03-08 MED ORDER — MIDAZOLAM HCL 2 MG/2ML IJ SOLN
INTRAMUSCULAR | Status: AC | PRN
  Administered 2023-03-08 (×2): 1 mg via INTRAVENOUS

## 2023-03-08 MED ORDER — HEPARIN SOD (PORK) LOCK FLUSH 100 UNIT/ML IV SOLN
500.0000 [IU] | Freq: Once | INTRAVENOUS | Status: AC
  Administered 2023-03-08: 500 [IU] via INTRAVENOUS

## 2023-03-08 MED ORDER — MIDAZOLAM HCL 2 MG/2ML IJ SOLN
INTRAMUSCULAR | Status: AC
  Filled 2023-03-08: qty 2

## 2023-03-08 MED ORDER — LIDOCAINE-EPINEPHRINE 1 %-1:100000 IJ SOLN
INTRAMUSCULAR | Status: AC
  Filled 2023-03-08: qty 1

## 2023-03-08 MED ORDER — LIDOCAINE-EPINEPHRINE 1 %-1:100000 IJ SOLN
20.0000 mL | Freq: Once | INTRAMUSCULAR | Status: AC
  Administered 2023-03-08: 20 mL via INTRADERMAL

## 2023-03-08 NOTE — Procedures (Signed)
 Interventional Radiology Procedure Note  Procedure: Placement of a right IJ approach single lumen PowerPort.  Tip is positioned at the superior cavoatrial junction and catheter is ready for immediate use.  Complications: None Recommendations:  - Ok to shower tomorrow - Do not submerge for 7 days - Routine line care   Signed,  Yvone Neu. Loreta Ave, DO

## 2023-03-08 NOTE — Discharge Instructions (Signed)
 Implanted Port Insertion, Care After  The following information offers guidance on how to care for yourself after your procedure. Your health care provider may also give you more specific instructions. If you have problems or questions, contact your health care provider.  What can I expect after the procedure? After the procedure, it is common to have: Discomfort at the port insertion site. Bruising on the skin over the port. This should improve over 3-4 days.   Urgent needs - Interventional Radiology, clinic [phone] (mon-fri 8-5).   Wound - May remove dressing and shower in 24 to 48 hours.  Keep site clean and dry.  Replace with bandaid as needed.  Do not submerge in tub or water until site healing well. If closed with glue, glue will flake off on its own.   If ordered by your provider, may start Emla cream (or any other creams ointments or lotions) in 2 weeks or after incision is healed. Port is ready for use immediately.   After completion of treatment, your provider should have you set up for monthly port flushes.   Follow these instructions at home: [facility] care After your port is placed, you will get a manufacturer's information card. The card has information about your port. Keep this card with you at all times. Take care of the port as told by your health care provider. Ask your health care provider if you or a family member can get training for taking care of the port at home. A home health care nurse will be be available to help care for the port. Make sure to remember what type of port you have. Incision care     Follow instructions from your health care provider about how to take care of your port insertion site. Make sure you: Wash your hands with soap and water for at least 20 seconds before and after you change your bandage (dressing). If soap and water are not available, use hand sanitizer. Change your dressing as told by your health care provider. Leave stitches  (sutures), skin glue, or adhesive strips in place. These skin closures may need to stay in place for 2 weeks or longer. If adhesive strip edges start to loosen and curl up, you may trim the loose edges. Do not remove adhesive strips completely unless your health care provider tells you to do that. Check your port insertion site every day for signs of infection. Check for: Redness, swelling, or pain. Fluid or blood. Warmth. Pus or a bad smell. Activity Return to your normal activities as told by your health care provider. Ask your health care provider what activities are safe for you. You may have to avoid lifting. Ask your health care provider how much you can safely lift. General instructions Take over-the-counter and prescription medicines only as told by your health care provider. Do not take baths, swim, or use a hot tub until your health care provider approves. Ask your health care provider if you may take showers. You may only be allowed to take sponge baths. If you were given a sedative during the procedure, it can affect you for several hours. Do not drive or operate machinery until your health care provider says that it is safe. Wear a medical alert bracelet in case of an emergency. This will tell any health care providers that you have a port. Keep all follow-up visits. This is important. Contact a health care provider if: You cannot flush your port with saline as directed, or you cannot  draw blood from the port. You have a fever or chills. You have redness, swelling, or pain around your port insertion site. You have fluid or blood coming from your port insertion site. Your port insertion site feels warm to the touch. You have pus or a bad smell coming from the port insertion site. Get help right away if: You have chest pain or shortness of breath. You have bleeding from your port that you cannot control. These symptoms may be an emergency. Get help right away. Call 911. Do not  wait to see if the symptoms will go away. Do not drive yourself to the hospital. Summary Take care of the port as told by your health care provider. Keep the manufacturer's information card with you at all times. Change your dressing as told by your health care provider. Contact a health care provider if you have a fever or chills or if you have redness, swelling, or pain around your port insertion site. Keep all follow-up visits. This information is not intended to replace advice given to you by your health care provider. Make sure you discuss any questions you have with your health care provider. Document Revised: 06/28/2020 Document Reviewed: 06/28/2020 Elsevier Patient Education  2023 [employer]    Moderate Conscious Sedation  Adult  Care After (English)  After the procedure, it is common to have: Sleepiness for a few hours. Impaired judgment for a few hours. Trouble with balance. Nausea or vomiting if you eat too soon. Follow these instructions at home: For the time period you were told by your health care provider:  Rest. Do not participate in activities where you could fall or become injured. Do not drive or use machinery. Do not drink alcohol. Do not take sleeping pills or medicines that cause drowsiness. Do not make important decisions or sign legal documents. Do not take care of children on your own. Eating and drinking Follow instructions from your health care provider about what you may eat and drink. Drink enough fluid to keep your urine pale yellow. If you vomit: Drink clear fluids slowly and in small amounts as you are able. Clear fluids include water, ice chips, low-calorie sports drinks, and fruit juice that has water added to it (diluted fruit juice). Eat light and bland foods in small amounts as you are able. These foods include bananas, applesauce, rice, lean meats, toast, and crackers. General instructions Take over-the-counter and prescription medicines  only as told by your health care provider. Have a responsible adult stay with you for the time you are told. Do not use any products that contain nicotine or tobacco. These products include cigarettes, chewing tobacco, and vaping devices, such as e-cigarettes. If you need help quitting, ask your health care provider. Return to your normal activities as told by your health care provider. Ask your health care provider what activities are safe for you. Your health care provider may give you more instructions. Make sure you know what you can and cannot do. Contact a health care provider if: You are still sleepy or having trouble with balance after 24 hours. You feel light-headed. You vomit every time you eat or drink. You get a rash. You have a fever. You have redness or swelling around the IV site. Get help right away if: You have trouble breathing. You start to feel confused at home. These symptoms may be an emergency. Get help right away. Call 911. Do not wait to see if the symptoms will go away. Do not  drive yourself to the hospital. This information is not intended to replace advice given to you by your health care provider. Make sure you discuss any questions you have with your health care provider.

## 2023-03-11 ENCOUNTER — Other Ambulatory Visit: Payer: Self-pay

## 2023-03-11 ENCOUNTER — Inpatient Hospital Stay: Payer: 59 | Attending: Internal Medicine

## 2023-03-11 ENCOUNTER — Ambulatory Visit: Payer: 59 | Admitting: Physician Assistant

## 2023-03-11 ENCOUNTER — Encounter: Payer: Self-pay | Admitting: Internal Medicine

## 2023-03-11 ENCOUNTER — Other Ambulatory Visit: Payer: Self-pay | Admitting: Medical Oncology

## 2023-03-11 ENCOUNTER — Other Ambulatory Visit: Payer: 59

## 2023-03-11 ENCOUNTER — Inpatient Hospital Stay: Payer: 59

## 2023-03-11 ENCOUNTER — Other Ambulatory Visit: Payer: Self-pay | Admitting: Physician Assistant

## 2023-03-11 ENCOUNTER — Encounter: Payer: Self-pay | Admitting: Physician Assistant

## 2023-03-11 VITALS — BP 130/58 | HR 71 | Temp 98.1°F | Resp 16 | Wt 262.5 lb

## 2023-03-11 DIAGNOSIS — Z79899 Other long term (current) drug therapy: Secondary | ICD-10-CM | POA: Diagnosis not present

## 2023-03-11 DIAGNOSIS — Z7961 Long term (current) use of immunomodulator: Secondary | ICD-10-CM | POA: Diagnosis not present

## 2023-03-11 DIAGNOSIS — Z7969 Long term (current) use of other immunomodulators and immunosuppressants: Secondary | ICD-10-CM | POA: Diagnosis not present

## 2023-03-11 DIAGNOSIS — Z7952 Long term (current) use of systemic steroids: Secondary | ICD-10-CM | POA: Insufficient documentation

## 2023-03-11 DIAGNOSIS — Z885 Allergy status to narcotic agent status: Secondary | ICD-10-CM | POA: Insufficient documentation

## 2023-03-11 DIAGNOSIS — C9000 Multiple myeloma not having achieved remission: Secondary | ICD-10-CM

## 2023-03-11 DIAGNOSIS — D649 Anemia, unspecified: Secondary | ICD-10-CM | POA: Insufficient documentation

## 2023-03-11 DIAGNOSIS — Z9049 Acquired absence of other specified parts of digestive tract: Secondary | ICD-10-CM | POA: Insufficient documentation

## 2023-03-11 DIAGNOSIS — T451X5A Adverse effect of antineoplastic and immunosuppressive drugs, initial encounter: Secondary | ICD-10-CM

## 2023-03-11 DIAGNOSIS — Z5112 Encounter for antineoplastic immunotherapy: Secondary | ICD-10-CM | POA: Insufficient documentation

## 2023-03-11 DIAGNOSIS — D472 Monoclonal gammopathy: Secondary | ICD-10-CM | POA: Insufficient documentation

## 2023-03-11 DIAGNOSIS — Z79624 Long term (current) use of inhibitors of nucleotide synthesis: Secondary | ICD-10-CM | POA: Diagnosis not present

## 2023-03-11 DIAGNOSIS — Z880 Allergy status to penicillin: Secondary | ICD-10-CM | POA: Diagnosis not present

## 2023-03-11 DIAGNOSIS — Z95828 Presence of other vascular implants and grafts: Secondary | ICD-10-CM | POA: Insufficient documentation

## 2023-03-11 LAB — CMP (CANCER CENTER ONLY)
ALT: 23 U/L (ref 0–44)
AST: 31 U/L (ref 15–41)
Albumin: 4.2 g/dL (ref 3.5–5.0)
Alkaline Phosphatase: 167 U/L — ABNORMAL HIGH (ref 38–126)
Anion gap: 8 (ref 5–15)
BUN: 8 mg/dL (ref 8–23)
CO2: 23 mmol/L (ref 22–32)
Calcium: 8.6 mg/dL — ABNORMAL LOW (ref 8.9–10.3)
Chloride: 106 mmol/L (ref 98–111)
Creatinine: 0.72 mg/dL (ref 0.44–1.00)
GFR, Estimated: >60 mL/min (ref >60)
Glucose, Bld: 185 mg/dL — ABNORMAL HIGH (ref 70–99)
Potassium: 4.2 mmol/L (ref 3.5–5.1)
Sodium: 137 mmol/L (ref 135–145)
Total Bilirubin: 0.7 mg/dL (ref 0.0–1.2)
Total Protein: 6.6 g/dL (ref 6.5–8.1)

## 2023-03-11 LAB — CBC WITH DIFFERENTIAL (CANCER CENTER ONLY)
Abs Immature Granulocytes: 0.01 10*3/uL (ref 0.00–0.07)
Basophils Absolute: 0 10*3/uL (ref 0.0–0.1)
Basophils Relative: 0 %
Eosinophils Absolute: 0.1 10*3/uL (ref 0.0–0.5)
Eosinophils Relative: 1 %
HCT: 27.4 % — ABNORMAL LOW (ref 36.0–46.0)
Hemoglobin: 8.1 g/dL — ABNORMAL LOW (ref 12.0–15.0)
Immature Granulocytes: 0 %
Lymphocytes Relative: 8 %
Lymphs Abs: 0.4 10*3/uL — ABNORMAL LOW (ref 0.7–4.0)
MCH: 24.5 pg — ABNORMAL LOW (ref 26.0–34.0)
MCHC: 29.6 g/dL — ABNORMAL LOW (ref 30.0–36.0)
MCV: 83 fL (ref 80.0–100.0)
Monocytes Absolute: 0.1 10*3/uL (ref 0.1–1.0)
Monocytes Relative: 1 %
Neutro Abs: 3.7 10*3/uL (ref 1.7–7.7)
Neutrophils Relative %: 90 %
Platelet Count: 109 10*3/uL — ABNORMAL LOW (ref 150–400)
RBC: 3.3 MIL/uL — ABNORMAL LOW (ref 3.87–5.11)
RDW: 17 % — ABNORMAL HIGH (ref 11.5–15.5)
WBC Count: 4.2 10*3/uL (ref 4.0–10.5)
nRBC: 0 % (ref 0.0–0.2)

## 2023-03-11 LAB — SAMPLE TO BLOOD BANK

## 2023-03-11 LAB — PREPARE RBC (CROSSMATCH)

## 2023-03-11 MED ORDER — SODIUM CHLORIDE 0.9% FLUSH
10.0000 mL | INTRAVENOUS | Status: DC | PRN
  Administered 2023-03-11: 10 mL

## 2023-03-11 MED ORDER — DEXTROSE 5 % IV SOLN
56.0000 mg/m2 | Freq: Once | INTRAVENOUS | Status: AC
  Administered 2023-03-11: 120 mg via INTRAVENOUS
  Filled 2023-03-11: qty 60

## 2023-03-11 MED ORDER — SODIUM CHLORIDE 0.9% FLUSH
10.0000 mL | INTRAVENOUS | Status: DC | PRN
  Administered 2023-03-11: 10 mL via INTRAVENOUS

## 2023-03-11 MED ORDER — HEPARIN SOD (PORK) LOCK FLUSH 100 UNIT/ML IV SOLN
500.0000 [IU] | Freq: Once | INTRAVENOUS | Status: AC | PRN
  Administered 2023-03-11: 500 [IU]

## 2023-03-11 MED ORDER — SODIUM CHLORIDE 0.9 % IV SOLN: Freq: Once | INTRAVENOUS | Status: DC

## 2023-03-11 MED ORDER — SODIUM CHLORIDE 0.9 % IV SOLN: INTRAVENOUS | Status: DC

## 2023-03-11 NOTE — Progress Notes (Signed)
 Per Dr. Arbutus Ped , it is okay to treat pt today with Carfilzomib  and selinexor (XPOVIO) and fatigue and hgb 8.1.

## 2023-03-11 NOTE — Progress Notes (Signed)
 Blood orders entered for 1 unit.  Brenda Conner in the blood bank confirmed.

## 2023-03-11 NOTE — Progress Notes (Signed)
Blood transfusion orders entered.

## 2023-03-12 ENCOUNTER — Other Ambulatory Visit: Payer: Self-pay | Admitting: Physician Assistant

## 2023-03-12 ENCOUNTER — Inpatient Hospital Stay

## 2023-03-12 DIAGNOSIS — Z5112 Encounter for antineoplastic immunotherapy: Secondary | ICD-10-CM | POA: Diagnosis not present

## 2023-03-12 DIAGNOSIS — D649 Anemia, unspecified: Secondary | ICD-10-CM

## 2023-03-12 DIAGNOSIS — C9000 Multiple myeloma not having achieved remission: Secondary | ICD-10-CM

## 2023-03-12 MED ORDER — SODIUM CHLORIDE 0.9% FLUSH
10.0000 mL | INTRAVENOUS | Status: AC | PRN
  Administered 2023-03-12: 10 mL

## 2023-03-12 MED ORDER — ACETAMINOPHEN 325 MG PO TABS
650.0000 mg | ORAL_TABLET | Freq: Once | ORAL | Status: AC
  Administered 2023-03-12: 650 mg via ORAL
  Filled 2023-03-12: qty 2

## 2023-03-12 MED ORDER — HEPARIN SOD (PORK) LOCK FLUSH 100 UNIT/ML IV SOLN
500.0000 [IU] | Freq: Every day | INTRAVENOUS | Status: AC | PRN
  Administered 2023-03-12: 500 [IU]

## 2023-03-12 MED ORDER — DIPHENHYDRAMINE HCL 25 MG PO CAPS
25.0000 mg | ORAL_CAPSULE | Freq: Once | ORAL | Status: AC
Start: 2023-03-12 — End: 2023-03-12
  Administered 2023-03-12: 25 mg via ORAL
  Filled 2023-03-12: qty 1

## 2023-03-12 MED ORDER — SODIUM CHLORIDE 0.9% IV SOLUTION
250.0000 mL | INTRAVENOUS | Status: DC
  Administered 2023-03-12: 100 mL via INTRAVENOUS

## 2023-03-12 NOTE — Patient Instructions (Signed)

## 2023-03-13 ENCOUNTER — Other Ambulatory Visit: Payer: Self-pay | Admitting: Internal Medicine

## 2023-03-13 LAB — TYPE AND SCREEN
ABO/RH(D): B NEG
Antibody Screen: POSITIVE
Unit division: 0

## 2023-03-13 LAB — BPAM RBC
Blood Product Expiration Date: 202503202359
ISSUE DATE / TIME: 202503040927
Unit Type and Rh: 1700
Unit Type and Rh: 202503202359

## 2023-03-13 MED ORDER — SELINEXOR (60 MG ONCE WEEKLY) 60 MG PO TBPK: 60.0000 mg | ORAL_TABLET | ORAL | 0 refills | Status: DC

## 2023-03-15 ENCOUNTER — Other Ambulatory Visit: Payer: Self-pay | Admitting: Medical Oncology

## 2023-03-15 DIAGNOSIS — C9000 Multiple myeloma not having achieved remission: Secondary | ICD-10-CM

## 2023-03-15 MED ORDER — SELINEXOR (60 MG ONCE WEEKLY) 60 MG PO TBPK: 60.0000 mg | ORAL_TABLET | ORAL | 0 refills | Status: DC

## 2023-03-17 ENCOUNTER — Other Ambulatory Visit: Payer: Self-pay | Admitting: Physician Assistant

## 2023-03-18 ENCOUNTER — Other Ambulatory Visit: Payer: Self-pay | Admitting: Nurse Practitioner

## 2023-03-18 ENCOUNTER — Other Ambulatory Visit

## 2023-03-18 ENCOUNTER — Inpatient Hospital Stay

## 2023-03-18 DIAGNOSIS — K7581 Nonalcoholic steatohepatitis (NASH): Secondary | ICD-10-CM

## 2023-03-18 DIAGNOSIS — C9000 Multiple myeloma not having achieved remission: Secondary | ICD-10-CM

## 2023-03-18 DIAGNOSIS — D649 Anemia, unspecified: Secondary | ICD-10-CM

## 2023-03-18 DIAGNOSIS — Z5112 Encounter for antineoplastic immunotherapy: Secondary | ICD-10-CM | POA: Diagnosis not present

## 2023-03-18 DIAGNOSIS — Z95828 Presence of other vascular implants and grafts: Secondary | ICD-10-CM

## 2023-03-18 DIAGNOSIS — K7469 Other cirrhosis of liver: Secondary | ICD-10-CM

## 2023-03-18 DIAGNOSIS — T451X5A Adverse effect of antineoplastic and immunosuppressive drugs, initial encounter: Secondary | ICD-10-CM

## 2023-03-18 LAB — CBC WITH DIFFERENTIAL (CANCER CENTER ONLY)
Abs Immature Granulocytes: 0.01 10*3/uL (ref 0.00–0.07)
Basophils Absolute: 0 10*3/uL (ref 0.0–0.1)
Basophils Relative: 0 %
Eosinophils Absolute: 0 10*3/uL (ref 0.0–0.5)
Eosinophils Relative: 0 %
HCT: 27.3 % — ABNORMAL LOW (ref 36.0–46.0)
Hemoglobin: 8.4 g/dL — ABNORMAL LOW (ref 12.0–15.0)
Immature Granulocytes: 0 %
Lymphocytes Relative: 18 %
Lymphs Abs: 0.6 10*3/uL — ABNORMAL LOW (ref 0.7–4.0)
MCH: 24.8 pg — ABNORMAL LOW (ref 26.0–34.0)
MCHC: 30.8 g/dL (ref 30.0–36.0)
MCV: 80.5 fL (ref 80.0–100.0)
Monocytes Absolute: 0.2 10*3/uL (ref 0.1–1.0)
Monocytes Relative: 8 %
Neutro Abs: 2.3 10*3/uL (ref 1.7–7.7)
Neutrophils Relative %: 74 %
Platelet Count: 73 10*3/uL — ABNORMAL LOW (ref 150–400)
RBC: 3.39 MIL/uL — ABNORMAL LOW (ref 3.87–5.11)
RDW: 16.6 % — ABNORMAL HIGH (ref 11.5–15.5)
WBC Count: 3.2 10*3/uL — ABNORMAL LOW (ref 4.0–10.5)
nRBC: 0 % (ref 0.0–0.2)

## 2023-03-18 LAB — TYPE AND SCREEN
ABO/RH(D): B NEG
Antibody Screen: POSITIVE

## 2023-03-18 LAB — SAMPLE TO BLOOD BANK

## 2023-03-18 MED ORDER — SODIUM CHLORIDE 0.9% FLUSH
10.0000 mL | INTRAVENOUS | Status: DC | PRN
  Administered 2023-03-18: 10 mL via INTRAVENOUS

## 2023-03-18 MED ORDER — HEPARIN SOD (PORK) LOCK FLUSH 100 UNIT/ML IV SOLN
500.0000 [IU] | Freq: Once | INTRAVENOUS | Status: AC
  Administered 2023-03-18: 500 [IU] via INTRAVENOUS

## 2023-03-19 ENCOUNTER — Other Ambulatory Visit: Payer: Self-pay

## 2023-03-20 NOTE — Progress Notes (Signed)
 [facility] OFFICE PROGRESS NOTE  Brenda Rocks, MD [address] [place] [address]  DIAGNOSIS: Plasma cell dyscrasia initially diagnosed as MGUS in September 2010, with additional symptoms suggestive of POEMS syndrome.   PRIOR THERAPY: 1) Velcade 1.3 MG/M2 subcutaneously with Decadron 40 mg by mouth on a weekly basis. First cycle 11/24/2013. She status post 31 weekly doses of treatment. 2) Velcade 1.3 MG/M2 subcutaneously and weekly basis with Decadron 40 mg by mouth weekly. First dose 02/01/2015. Status post 28 cycles. 3) Revlimid 25 mg by mouth daily for 21 days every 4 weeks with weekly Decadron 20 mg. started in 11/27/2015. Status post 3 cycles discontinued secondary to lack of response. 4) Systemic treatment with Velcade 1.3 MG/KG weekly, Revlimid 25 mg by mouth daily for 21 days every 4 weeks in addition to Decadron 20 mg by mouth weekly. First dose 03/06/2016. Status post 42 cycles.  She has a break off treatment from June 2021 until July 2022. 5) Second line treatment with daratumumab, Pomalyst 3 mg for 21 days every 4 weeks as well as Decadron 20 mg p.o. weekly.  First dose 09/26/2022.  Status post 1 cycle and currently receiving day 15 of cycle #2.  CURRENT THERAPY: Third treatment with selinexor, carfilzomib and dex 28-day cycle. Carfilzomib 56 mg/m2 IV over 30 minutes on days 1, 8 and 15 (20 mg/m2 with cycle 1, day 1). Escalate to 70 mg/m2 with cycle 2 or 3 as tolerated. Selinexor 60 mg PO on days 1, 8, 15 and 22. Escalate to 80 mg weekly as tolerated. Dex 20 mg PO on days 1, 8, 15 and 22.   INTERVAL HISTORY: Brenda Conner 63 y.o. female returns to the clinic today for a follow-up visit.  The patient was last seen in the clinic by myself on 03/04/2023.  In summary, when the patient was seen by Dr. Arbutus Ped in early February 2025 she is found to have disease progression of her multiple myeloma and after consultation with Dr. Marissa Calamity at [facility]. He  recommended treatment with  selinexor, carfilzomib and dex  28-day cycle. Carfilzomib 56 mg/m2 IV over 30 minutes on days 1, 8 and 15.   She had a port-a-cath placement since being seen. She is happy to have this.   She has been having some more fatigue and anemia recently requiring a blood transfusion for symptomatic anemia last week.  She was also having increased dyspnea on exertion. She had a blood transfusion and she is feeling better than prior. She had never had her Hbg as low before and has never required a blood transfusion until recently. Therefore, she is more symptomatic.  She was not as short of breath with exertion after receiving the blood transfusion.  She also reports that for the last few days she has felt more like herself/"human again" and performing more of her typical day-to-day activities.   She does have some increased bloating.  She also has some nausea which she has noticed is typically on Wednesdays.  Therefore she is taking her antiemetics preventatively on Wednesdays.  Has Zyprexa and Zofran.  She also takes Xanax prescribed by her gynecologist as needed for anxiety. The patient is taking an iron supplement for anemia.  She denies any fever or unexplained weight loss.  Patient is typically hot natured although with the worsening anemia she has had some more sensations of feeling cold recently.  She reports her appetite is "okay".  She denies any signs and symptoms  of infection including sore throat, cough, or skin infections.  She denies any abnormal bleeding or bruising.  She is here today for evaluation repeat blood work before undergoing day 1 cycle 2.    MEDICAL HISTORY: Past Medical History:  Diagnosis Date   Acid reflux    Anemia    Anxiety    Asthma    Cancer (HCC)    waldenstroms/ macroglobinulemia   Depression    Depression    Diabetes mellitus without complication (HCC)    Dysuria 02/28/2016   Gallstones    Heart palpitations    Hypercholesteremia     Hypertension    Hypothyroidism    Macroglobulinemia    ? POEMS syndrome   Monoclonal gammopathy of unknown significance (MGUS)    Multiple myeloma (HCC)    Obesity    POEMS syndrome    PONV (postoperative nausea and vomiting)    Sleep apnea    CPAP at bedtime    ALLERGIES:  is allergic to codeine, hydrocodone, lortab [hydrocodone-acetaminophen], onion, shellfish allergy, and amoxicillin.  MEDICATIONS:  Current Outpatient Medications  Medication Sig Dispense Refill   ACCU-CHEK AVIVA PLUS test strip USE TO CHECK SUGAR TWICE A DAY 90     acyclovir (ZOVIRAX) 400 MG tablet Take 1 tablet (400 mg total) by mouth daily. 30 tablet 3   ALPRAZolam (XANAX) 0.5 MG tablet Take 1 tablet (0.5 mg total) by mouth at bedtime as needed for anxiety. 30 tablet 0   aspirin 81 MG chewable tablet Chew by mouth daily.     Azelastine HCl 0.15 % SOLN as needed.     B-D UF III MINI PEN NEEDLES 31G X 5 MM MISC      Blood Glucose Monitoring Suppl (ONE TOUCH ULTRA MINI) W/DEVICE KIT See admin instructions. Reported on 01/25/2015  0   cetirizine (ZYRTEC ALLERGY) 10 MG tablet Take 1 tablet by mouth daily.     Continuous Glucose Sensor (FREESTYLE LIBRE 3 SENSOR) MISC USE 1 SENSOR EVERY 14 DAYS     dexamethasone (DECADRON) 4 MG tablet Take 5 tablets (20 mg total) by mouth once a week. Take the day after darzalex faspro. Take with breakfast. 20 tablet 11   dexamethasone (DECADRON) 4 MG tablet Take 5 tablets (20mg ) on days 1, 8, 15 and 22 every 28 days 20 tablet 4   DiphenhydrAMINE HCl (BENADRYL ALLERGY PO) Take 1 Dose by mouth daily at 6 (six) AM.     fexofenadine (ALLEGRA ALLERGY) 180 MG tablet Take 1 tablet by mouth daily.     Fluticasone-Salmeterol (ADVAIR) 100-50 MCG/DOSE AEPB Inhale 2 puffs into the lungs every 12 (twelve) hours.     glucosamine-chondroitin 500-400 MG tablet Take 1 tablet by mouth daily.     ibuprofen (ADVIL,MOTRIN) 100 MG tablet Take 100 mg by mouth every 6 (six) hours as needed. Reported on  05/31/2015     Insulin Glargine (BASAGLAR KWIKPEN) 100 UNIT/ML Inject 20 Units into the skin at bedtime.     levothyroxine (SYNTHROID) 137 MCG tablet Take 137 mcg by mouth daily.     lidocaine-prilocaine (EMLA) cream Apply 1 Application topically as needed. 30 g 2   lisinopril (PRINIVIL,ZESTRIL) 10 MG tablet Take 10 mg by mouth daily. auth number 05/29/2018 [id number]  3   loperamide (IMODIUM) 2 MG capsule Take 2 mg by mouth as needed for diarrhea or loose stools.     magic mouthwash w/lidocaine SOLN Take 5 mLs by mouth 4 (four) times daily as needed for mouth pain. Swish, Gargle,  and spit 240 mL 1   Melatonin 5 MG CAPS Take 1 capsule by mouth at bedtime.     metFORMIN (GLUCOPHAGE-XR) 500 MG 24 hr tablet Take 500 mg by mouth daily.     NOVOLOG FLEXPEN 100 UNIT/ML FlexPen Inject 15 Units into the skin in the morning, at noon, and at bedtime. Per Sliding Scale     OLANZapine (ZYPREXA) 5 MG tablet TAKE ONE TABLET THE DAY BEFORE, THE DAY OF, AND THE DAY AFTER SELINEXOR. 90 tablet 1   omeprazole (PRILOSEC) 20 MG capsule Take 20 mg by mouth 2 (two) times daily.     ondansetron (ZOFRAN) 8 MG tablet Take 1 tablet (8 mg total) by mouth every 8 (eight) hours as needed for nausea or vomiting. 30 tablet 1   pravastatin (PRAVACHOL) 80 MG tablet Take 80 mg by mouth daily.     PROAIR HFA 108 (90 Base) MCG/ACT inhaler Reported on 06/21/2015  1   prochlorperazine (COMPAZINE) 10 MG tablet Take 1 tablet (10 mg total) by mouth every 6 (six) hours as needed for nausea or vomiting. 30 tablet 1   selinexor (XPOVIO) Therapy Pack (60 mg once weekly) Take 1 tablet (60 mg total) by mouth once a week. 4 tablet 0   sertraline (ZOLOFT) 50 MG tablet TAKE 3 TABLETS BY MOUTH EVERY DAY 270 tablet 1   verapamil (CALAN-SR) 240 MG CR tablet Take 240 mg by mouth 2 (two) times daily.     No current facility-administered medications for this visit.    SURGICAL HISTORY:  Past Surgical History:  Procedure Laterality Date   ABLATION   09/09/2007   HTA and polyp resection   BONE MARROW BIOPSY  04/08/2012   BONE MARROW BIOPSY  01/2020   CATARACT EXTRACTION, BILATERAL Bilateral 02/2022   FOOT SURGERY Bilateral 01/09/1996   small toe   IR IMAGING GUIDED PORT INSERTION  03/08/2023   KNEE ARTHROSCOPY W/ MENISCAL REPAIR Bilateral 10/10 ; 3/11   LAPAROSCOPIC CHOLECYSTECTOMY  01/09/1995   LUMBAR DISC SURGERY  03/08/2004   herniation, L4-L5   NASAL SINUS SURGERY  01/08/2002   UTERINE FIBROID EMBOLIZATION  2009   HTA and polyp resection    REVIEW OF SYSTEMS:   Constitutional: Positive for fatigue. Negative for chills, fever and unexpected weight change.  HENT: Negative for mouth sores, nosebleeds, sore throat and trouble swallowing.   Eyes: Negative for eye problems and icterus.  Respiratory: Positive for improved dyspnea on exertion. Negative for cough, hemoptysis, and wheezing.   Cardiovascular: Negative for chest pain and leg swelling.  Gastrointestinal: Positive for chronic diarrhea and nausea. Positive for intermittent bloating. Negative for abdominal pain, constipation, and vomiting.  Genitourinary: Negative for bladder incontinence, difficulty urinating, dysuria, frequency and hematuria.   Musculoskeletal: Negative for back pain, gait problem, neck pain and neck stiffness.  Skin: Negative for itching and rash.  Neurological: Negative for dizziness, extremity weakness, gait problem, headaches, light-headedness and seizures.  Hematological: Negative for adenopathy. Does not bruise/bleed easily.  Psychiatric/Behavioral: Negative for confusion, depression and sleep disturbance. The patient is not nervous/anxious.     PHYSICAL EXAMINATION:  There were no vitals taken for this visit.  ECOG PERFORMANCE STATUS: 1  Physical Exam  Constitutional: Oriented to person, place, and time and well-developed, well-nourished, and in no distress.  HENT:  Head: Normocephalic and atraumatic.  Mouth/Throat: Oropharynx is clear and  moist. No oropharyngeal exudate.  Eyes: Conjunctivae are normal. Right eye exhibits no discharge. Left eye exhibits no discharge. No scleral icterus.  Neck: Normal range of motion. Neck supple.  Cardiovascular: Normal rate, regular rhythm, normal heart sounds and intact distal pulses.   Pulmonary/Chest: Effort normal and breath sounds normal. No respiratory distress. No wheezes. No rales.  Abdominal: Soft. Bowel sounds are normal. Exhibits no distension and no mass. There is no tenderness.  Musculoskeletal: Normal range of motion. Exhibits no edema.  Lymphadenopathy:    No cervical adenopathy.  Neurological: Alert and oriented to person, place, and time. Exhibits normal muscle tone. Gait normal. Coordination normal.  Skin: Skin is warm and dry. No rash noted. Not diaphoretic. No erythema. No pallor.  Psychiatric: Mood, memory and judgment normal.  Vitals reviewed.  LABORATORY DATA: Lab Results  Component Value Date   WBC 3.2 (L) 03/18/2023   HGB 8.4 (L) 03/18/2023   HCT 27.3 (L) 03/18/2023   MCV 80.5 03/18/2023   PLT 73 (L) 03/18/2023      Chemistry      Component Value Date/Time   NA 137 03/11/2023 1320   NA 138 11/14/2016 0824   K 4.2 03/11/2023 1320   K 4.3 11/14/2016 0824   CL 106 03/11/2023 1320   CL 104 04/07/2012 1415   CO2 23 03/11/2023 1320   CO2 23 11/14/2016 0824   BUN 8 03/11/2023 1320   BUN 9.1 11/14/2016 0824   CREATININE 0.72 03/11/2023 1320   CREATININE 0.7 11/14/2016 0824      Component Value Date/Time   CALCIUM 8.6 (L) 03/11/2023 1320   CALCIUM 9.6 11/14/2016 0824   ALKPHOS 167 (H) 03/11/2023 1320   ALKPHOS 94 11/14/2016 0824   AST 31 03/11/2023 1320   AST 28 11/14/2016 0824   ALT 23 03/11/2023 1320   ALT 24 11/14/2016 0824   BILITOT 0.7 03/11/2023 1320   BILITOT 0.41 11/14/2016 0824       RADIOGRAPHIC STUDIES:  IR IMAGING GUIDED PORT INSERTION Result Date: 03/08/2023 INDICATION: 63 year old female referred for port catheter EXAM: IMAGE  GUIDED PORT CATHETER MEDICATIONS: None. ANESTHESIA/SEDATION: Moderate (conscious) sedation was employed during this procedure. A total of Versed 2.0 mg and Fentanyl 100 mcg was administered intravenously. Moderate Sedation Time: 15 minutes. The patient's level of consciousness and vital signs were monitored continuously by radiology nursing throughout the procedure under my direct supervision. FLUOROSCOPY TIME:  Fluoroscopy Time:   (8 mGy). COMPLICATIONS: None PROCEDURE: Informed written consent was obtained from the patient after a discussion of the risks, benefits, and alternatives to treatment. Questions regarding the procedure were encouraged and answered. The right neck and chest were prepped with chlorhexidine in a sterile fashion, and a sterile drape was applied covering the operative field. Maximum barrier sterile technique with sterile gowns and gloves were used for the procedure. A timeout was performed prior to the initiation of the procedure. Ultrasound survey was performed with images stored and sent to PACs. Right IJ vein documented to be patent. The right neck and chest was prepped with chlorhexidine, and draped in the usual sterile fashion using maximum barrier technique (cap and mask, sterile gown, sterile gloves, large sterile sheet, hand hygiene and cutaneous antiseptic). Local anesthesia was attained by infiltration with 1% lidocaine without epinephrine. Ultrasound demonstrated patency of the right internal jugular vein, and this was documented with an image. Under real-time ultrasound guidance, this vein was accessed with a 21 gauge micropuncture needle and image documentation was performed. A small dermatotomy was made at the access site with an 11 scalpel. A 0.018" wire was advanced into the SVC and used to estimate the  length of the internal catheter. The access needle exchanged for a 74F micropuncture vascular sheath. The 0.018" wire was then removed and a 0.035" wire advanced into the IVC.  An appropriate location for the subcutaneous reservoir was selected below the clavicle and an incision was made through the skin and underlying soft tissues. The subcutaneous tissues were then dissected using a combination of blunt and sharp surgical technique and a pocket was formed. A single lumen power injectable portacatheter was then tunneled through the subcutaneous tissues from the pocket to the dermatotomy and the port reservoir placed within the subcutaneous pocket. The venous access site was then serially dilated and a peel away vascular sheath placed over the wire. The wire was removed and the port catheter advanced into position under fluoroscopic guidance. The catheter tip is positioned in the cavoatrial junction. This was documented with a spot image. The portacatheter was then tested and found to flush and aspirate well. The port was flushed with saline followed by 100 units/mL heparinized saline. The pocket was then closed in two layers using first subdermal inverted interrupted absorbable sutures followed by a running subcuticular suture. The epidermis was then sealed with Dermabond. The dermatotomy at the venous access site was also seal with Dermabond. Patient tolerated the procedure well and remained hemodynamically stable throughout. No complications encountered and no significant blood loss encountered IMPRESSION: Status post right IJ port catheter Signed, Yvone Neu. Miachel Roux, RPVI [facility] Electronically Signed   By: Gilmer Mor D.O.   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 Her myeloma panel showed continuous increase in her free lambda light chain.    The patient previously underwent 107 cycles of subcutaneous weekly Velcade, as well as Revlimid and Decadron on and off over the last several  years.   She had been off treatment since June 2021 until July 2022 in which she had evidence of disease progression.  Therefore, she was restarted on Velcade, Revlimid, and Decadron.  She has restarted treatment in July 2022 with cycle 107 of weekly velcade. She tolerated well without any concerning adverse side effects.  She was then given a break.   She was seen recently by Dr. Marissa Calamity at [facility] for second opinion and after extensive investigation is recommended for her to resume her treatment for the multiple myeloma with daratumumab, Pomalyst 3 Mg p.o. daily for 21 days every 4 weeks and Decadron 20 mg weekly. This was started in September 2024. This was discontinued due to disease progression in December 2024.    She saw multiple myeloma expert, Dr. Ladona Ridgel recently who recommended Selinexor (oral), carfilzomib (infusion), and Decadron was recommended. She started this on 02/25/23.     Labs were reviewed.  Her platelet count is 112K today.  I did discuss her doses with Dr. Arbutus Ped.  Given that she has had some worsening and symptomatic anemia, we will hold off on the step up dosing of her selinexor with this cycle.  She will continue with the 60 mg of selinexor for this cycle.  The patient is going to take her Zofran first thing in the morning to try and stay on top of any nausea.  She has not had any vomiting.  She also was encouraged to take Imodium should she develop diarrhea to avoid any dehydration or electrolyte abnormalities.   I have added standing  orders for weekly sample blood bank should she require any blood transfusions.    She will continue taking her iron supplement.   We will see her back in  before undergoing treatment with day 15 cycle #2.    She will continue to use her xanax for anxiety. Her Gyn will continue prescribing this.    The patient was advised to call immediately if she has any concerning symptoms in the interval. The patient voices  understanding of current disease status and treatment options and is in agreement with the current care plan. All questions were answered. The patient knows to call the clinic with any problems, questions or concerns. We can certainly see the patient much sooner if necessary    No orders of the defined types were placed in this encounter.  The total time spent in the appointment was 20-29 minutes  Janelle Spellman L [place], PA-C 03/20/23

## 2023-03-25 ENCOUNTER — Inpatient Hospital Stay (HOSPITAL_BASED_OUTPATIENT_CLINIC_OR_DEPARTMENT_OTHER): Payer: 59 | Admitting: Physician Assistant

## 2023-03-25 ENCOUNTER — Ambulatory Visit
Admission: RE | Admit: 2023-03-25 | Discharge: 2023-03-25 | Disposition: A | Discharge status: Home / Self Care | Source: Ambulatory Visit | Attending: Nurse Practitioner | Admitting: Nurse Practitioner

## 2023-03-25 ENCOUNTER — Inpatient Hospital Stay: Payer: 59

## 2023-03-25 VITALS — BP 127/50 | HR 74 | Temp 98.4°F | Resp 14 | Wt 256.9 lb

## 2023-03-25 VITALS — BP 123/56 | HR 73 | Temp 98.7°F | Resp 16

## 2023-03-25 DIAGNOSIS — K7581 Nonalcoholic steatohepatitis (NASH): Secondary | ICD-10-CM

## 2023-03-25 DIAGNOSIS — Z5112 Encounter for antineoplastic immunotherapy: Secondary | ICD-10-CM | POA: Diagnosis not present

## 2023-03-25 DIAGNOSIS — C9000 Multiple myeloma not having achieved remission: Secondary | ICD-10-CM

## 2023-03-25 DIAGNOSIS — K7469 Other cirrhosis of liver: Secondary | ICD-10-CM

## 2023-03-25 DIAGNOSIS — Z95828 Presence of other vascular implants and grafts: Secondary | ICD-10-CM

## 2023-03-25 DIAGNOSIS — T451X5A Adverse effect of antineoplastic and immunosuppressive drugs, initial encounter: Secondary | ICD-10-CM

## 2023-03-25 LAB — CMP (CANCER CENTER ONLY)
ALT: 32 U/L (ref 0–44)
AST: 45 U/L — ABNORMAL HIGH (ref 15–41)
Albumin: 4.4 g/dL (ref 3.5–5.0)
Alkaline Phosphatase: 196 U/L — ABNORMAL HIGH (ref 38–126)
Anion gap: 7 (ref 5–15)
BUN: 11 mg/dL (ref 8–23)
CO2: 23 mmol/L (ref 22–32)
Calcium: 8.7 mg/dL — ABNORMAL LOW (ref 8.9–10.3)
Chloride: 107 mmol/L (ref 98–111)
Creatinine: 0.76 mg/dL (ref 0.44–1.00)
GFR, Estimated: >60 mL/min (ref >60)
Glucose, Bld: 193 mg/dL — ABNORMAL HIGH (ref 70–99)
Potassium: 4.1 mmol/L (ref 3.5–5.1)
Sodium: 137 mmol/L (ref 135–145)
Total Bilirubin: 0.8 mg/dL (ref 0.0–1.2)
Total Protein: 6.7 g/dL (ref 6.5–8.1)

## 2023-03-25 LAB — CBC WITH DIFFERENTIAL (CANCER CENTER ONLY)
Abs Immature Granulocytes: 0.01 10*3/uL (ref 0.00–0.07)
Basophils Absolute: 0 10*3/uL (ref 0.0–0.1)
Basophils Relative: 0 %
Eosinophils Absolute: 0 10*3/uL (ref 0.0–0.5)
Eosinophils Relative: 0 %
HCT: 29.5 % — ABNORMAL LOW (ref 36.0–46.0)
Hemoglobin: 9 g/dL — ABNORMAL LOW (ref 12.0–15.0)
Immature Granulocytes: 0 %
Lymphocytes Relative: 10 %
Lymphs Abs: 0.3 10*3/uL — ABNORMAL LOW (ref 0.7–4.0)
MCH: 24.7 pg — ABNORMAL LOW (ref 26.0–34.0)
MCHC: 30.5 g/dL (ref 30.0–36.0)
MCV: 81 fL (ref 80.0–100.0)
Monocytes Absolute: 0 10*3/uL — ABNORMAL LOW (ref 0.1–1.0)
Monocytes Relative: 1 %
Neutro Abs: 2.9 10*3/uL (ref 1.7–7.7)
Neutrophils Relative %: 89 %
Platelet Count: 112 10*3/uL — ABNORMAL LOW (ref 150–400)
RBC: 3.64 MIL/uL — ABNORMAL LOW (ref 3.87–5.11)
RDW: 17.8 % — ABNORMAL HIGH (ref 11.5–15.5)
WBC Count: 3.3 10*3/uL — ABNORMAL LOW (ref 4.0–10.5)
nRBC: 0 % (ref 0.0–0.2)

## 2023-03-25 MED ORDER — SODIUM CHLORIDE 0.9 % IV SOLN: Freq: Once | INTRAVENOUS | Status: AC

## 2023-03-25 MED ORDER — HEPARIN SOD (PORK) LOCK FLUSH 100 UNIT/ML IV SOLN
500.0000 [IU] | Freq: Once | INTRAVENOUS | Status: AC | PRN
  Administered 2023-03-25: 500 [IU]

## 2023-03-25 MED ORDER — SODIUM CHLORIDE 0.9 % IV SOLN: INTRAVENOUS | Status: DC

## 2023-03-25 MED ORDER — SODIUM CHLORIDE 0.9% FLUSH
10.0000 mL | INTRAVENOUS | Status: DC | PRN
Start: 2023-03-25 — End: 2023-03-25
  Administered 2023-03-25: 10 mL

## 2023-03-25 MED ORDER — SODIUM CHLORIDE 0.9% FLUSH
10.0000 mL | INTRAVENOUS | Status: DC | PRN
  Administered 2023-03-25: 10 mL via INTRAVENOUS

## 2023-03-25 MED ORDER — DEXTROSE 5 % IV SOLN
56.0000 mg/m2 | Freq: Once | INTRAVENOUS | Status: AC
  Administered 2023-03-25: 120 mg via INTRAVENOUS
  Filled 2023-03-25: qty 60

## 2023-03-25 NOTE — Patient Instructions (Signed)
 [facility] - A DEPT OF [facility]  Discharge Instructions: Thank you for choosing [facility] to provide your oncology and hematology care.   If you have a lab appointment with the Cancer Center, please go directly to the Cancer Center and check in at the registration area.   Wear comfortable clothing and clothing appropriate for easy access to any Portacath or PICC line.   We strive to give you quality time with your provider. You may need to reschedule your appointment if you arrive late (15 or more minutes).  Arriving late affects you and other patients whose appointments are after yours.  Also, if you miss three or more appointments without notifying the office, you may be dismissed from the clinic at the provider's discretion.      For prescription refill requests, have your pharmacy contact our office and allow 72 hours for refills to be completed.    Today you received the following chemotherapy and/or immunotherapy agent: Carfilzomib (Kyprolis)      To help prevent nausea and vomiting after your treatment, we encourage you to take your nausea medication as directed.  BELOW ARE SYMPTOMS THAT SHOULD BE REPORTED IMMEDIATELY: *FEVER GREATER THAN 100.4 F (38 C) OR HIGHER *CHILLS OR SWEATING *NAUSEA AND VOMITING THAT IS NOT CONTROLLED WITH YOUR NAUSEA MEDICATION *UNUSUAL SHORTNESS OF BREATH *UNUSUAL BRUISING OR BLEEDING *URINARY PROBLEMS (pain or burning when urinating, or frequent urination) *BOWEL PROBLEMS (unusual diarrhea, constipation, pain near the anus) TENDERNESS IN MOUTH AND THROAT WITH OR WITHOUT PRESENCE OF ULCERS (sore throat, sores in mouth, or a toothache) UNUSUAL RASH, SWELLING OR PAIN  UNUSUAL VAGINAL DISCHARGE OR ITCHING   Items with * indicate a potential emergency and should be followed up as soon as possible or go to the Emergency Department if any problems should occur.  Please show the CHEMOTHERAPY ALERT CARD or  IMMUNOTHERAPY ALERT CARD at check-in to the Emergency Department and triage nurse.  Should you have questions after your visit or need to cancel or reschedule your appointment, please contact [facility] - A DEPT OF [facility]  Dept: [phone]  and follow the prompts.  Office hours are 8:00 a.m. to 4:30 p.m. Monday - Friday. Please note that voicemails left after 4:00 p.m. may not be returned until the following business day.  We are closed weekends and major holidays. You have access to a nurse at all times for urgent questions. Please call the main number to the clinic Dept: [phone] and follow the prompts.   For any non-urgent questions, you may also contact your provider using MyChart. We now offer e-Visits for anyone 22 and older to request care online for non-urgent symptoms. For details visit [email or web address].   Also download the MyChart app! Go to the app store, search "MyChart", open the app, select [place], and log in with your MyChart username and password.  Carfilzomib Injection What is this medication? CARFILZOMIB (kar FILZ oh mib) treats multiple myeloma, a type of bone marrow cancer. It works by blocking a protein that causes cancer cells to grow and multiply. This helps to slow or stop the spread of cancer cells. This medicine may be used for other purposes; ask your health care provider or pharmacist if you have questions. COMMON BRAND NAME(S): KYPROLIS What should I tell my care team before I take this medication? They need to know if you have any of these conditions:  Heart disease History of blood clots Irregular heartbeat Kidney disease Liver disease Lung or breathing disease An unusual or allergic reaction to carfilzomib, or other medications, foods, dyes, or preservatives If you or your partner are pregnant or trying to get pregnant Breastfeeding How should I use this medication? This medication is injected into a  vein. It is given by your care team in a hospital or clinic setting. Talk to your care team about the use of this medication in children. Special care may be needed. Overdosage: If you think you have taken too much of this medicine contact a poison control center or emergency room at once. NOTE: This medicine is only for you. Do not share this medicine with others. What if I miss a dose? Keep appointments for follow-up doses. It is important not to miss your dose. Call your care team if you are unable to keep an appointment. What may interact with this medication? Interactions are not expected. This list may not describe all possible interactions. Give your health care provider a list of all the medicines, herbs, non-prescription drugs, or dietary supplements you use. Also tell them if you smoke, drink alcohol, or use illegal drugs. Some items may interact with your medicine. What should I watch for while using this medication? Your condition will be monitored carefully while you are receiving this medication. You may need blood work while taking this medication. Check with your care team if you have severe diarrhea, nausea, and vomiting, or if you sweat a lot. The loss of too much body fluid may make it dangerous for you to take this medication. This medication may affect your coordination, reaction time, or judgment. Do not drive or operate machinery until you know how this medication affects you. Sit up or stand slowly to reduce the risk of dizzy or fainting spells. Drinking alcohol with this medication can increase the risk of these side effects. Talk to your care team if you may be pregnant. Serious birth defects can occur if you take this medication during pregnancy and for 6 months after the last dose. You will need a negative pregnancy test before starting this medication. Contraception is recommended while taking this medication and for 6 months after the last dose. Your care team can help you  find an option that works for you. If your partner can get pregnant, use a condom during sex while taking this medication and for 3 months after the last dose. Do not breastfeed while taking this medication and for 2 weeks after the last dose. This medication may cause infertility. Talk to your care team if you are concerned about your fertility. What side effects may I notice from receiving this medication? Side effects that you should report to your care team as soon as possible: Allergic reactions--skin rash, itching, hives, swelling of the face, lips, tongue, or throat Bleeding--bloody or black, tar-like stools, vomiting blood or brown material that looks like coffee grounds, red or dark brown urine, small red or purple spots on skin, unusual bruising or bleeding Blood clot--pain, swelling, or warmth in the leg, shortness of breath, chest pain Dizziness, loss of balance or coordination, confusion or trouble speaking Heart attack--pain or tightness in the chest, shoulders, arms, or jaw, nausea, shortness of breath, cold or clammy skin, feeling faint or lightheaded Heart failure--shortness of breath, swelling of the ankles, feet, or hands, sudden weight gain, unusual weakness or fatigue Heart rhythm changes--fast or irregular heartbeat, dizziness, feeling faint or lightheaded, chest pain, trouble breathing  Increase in blood pressure Infection--fever, chills, cough, sore throat, wounds that don't heal, pain or trouble when passing urine, general feeling of discomfort or being unwell Infusion reactions--chest pain, shortness of breath or trouble breathing, feeling faint or lightheaded Kidney injury--decrease in the amount of urine, swelling of the ankles, hands, or feet Liver injury--right upper belly pain, loss of appetite, nausea, light-colored stool, dark yellow or brown urine, yellowing skin or eyes, unusual weakness or fatigue Lung injury--shortness of breath or trouble breathing, cough,  spitting up blood, chest pain, fever Pulmonary hypertension--shortness of breath, chest pain, fast or irregular heartbeat, feeling faint or lightheaded, fatigue, swelling of the ankles or feet Stomach pain, bloody diarrhea, pale skin, unusual weakness or fatigue, decrease in the amount of urine, which may be signs of hemolytic uremic syndrome Sudden and severe headache, confusion, change in vision, seizures, which may be signs of posterior reversible encephalopathy syndrome (PRES) TTP--purple spots on the skin or inside the mouth, pale skin, yellowing skin or eyes, unusual weakness or fatigue, fever, fast or irregular heartbeat, confusion, change in vision, trouble speaking, trouble walking Tumor lysis syndrome (TLS)--nausea, vomiting, diarrhea, decrease in the amount of urine, dark urine, unusual weakness or fatigue, confusion, muscle pain or cramps, fast or irregular heartbeat, joint pain Side effects that usually do not require medical attention (report to your care team if they continue or are bothersome): Diarrhea Fatigue Nausea Trouble sleeping This list may not describe all possible side effects. Call your doctor for medical advice about side effects. You may report side effects to FDA at 1-800-FDA-1088. Where should I keep my medication? This medication is given in a hospital or clinic. It will not be stored at home. NOTE: This sheet is a summary. It may not cover all possible information. If you have questions about this medicine, talk to your doctor, pharmacist, or health care provider.  2024 Elsevier/Gold Standard (2021-05-25 00:00:00)

## 2023-03-25 NOTE — Progress Notes (Signed)
 Pt. states she took Zofran and Decadron 20 mg orally at home today prior to appointment.

## 2023-03-28 ENCOUNTER — Other Ambulatory Visit (HOSPITAL_BASED_OUTPATIENT_CLINIC_OR_DEPARTMENT_OTHER): Payer: Self-pay | Admitting: Obstetrics & Gynecology

## 2023-03-28 DIAGNOSIS — F419 Anxiety disorder, unspecified: Secondary | ICD-10-CM

## 2023-03-28 MED ORDER — ALPRAZOLAM 0.5 MG PO TABS: 0.5000 mg | ORAL_TABLET | Freq: Every evening | ORAL | 1 refills | Status: DC | PRN

## 2023-04-01 ENCOUNTER — Inpatient Hospital Stay: Payer: 59

## 2023-04-01 VITALS — BP 124/58 | HR 72 | Temp 98.2°F | Resp 18 | Wt 256.8 lb

## 2023-04-01 DIAGNOSIS — D701 Agranulocytosis secondary to cancer chemotherapy: Secondary | ICD-10-CM

## 2023-04-01 DIAGNOSIS — C9000 Multiple myeloma not having achieved remission: Secondary | ICD-10-CM

## 2023-04-01 DIAGNOSIS — Z95828 Presence of other vascular implants and grafts: Secondary | ICD-10-CM

## 2023-04-01 DIAGNOSIS — Z5112 Encounter for antineoplastic immunotherapy: Secondary | ICD-10-CM | POA: Diagnosis not present

## 2023-04-01 LAB — CMP (CANCER CENTER ONLY)
ALT: 22 U/L (ref 0–44)
AST: 27 U/L (ref 15–41)
Albumin: 4.2 g/dL (ref 3.5–5.0)
Alkaline Phosphatase: 133 U/L — ABNORMAL HIGH (ref 38–126)
Anion gap: 6 (ref 5–15)
BUN: 7 mg/dL — ABNORMAL LOW (ref 8–23)
CO2: 25 mmol/L (ref 22–32)
Calcium: 8.7 mg/dL — ABNORMAL LOW (ref 8.9–10.3)
Chloride: 106 mmol/L (ref 98–111)
Creatinine: 0.74 mg/dL (ref 0.44–1.00)
GFR, Estimated: >60 mL/min (ref >60)
Glucose, Bld: 181 mg/dL — ABNORMAL HIGH (ref 70–99)
Potassium: 4.3 mmol/L (ref 3.5–5.1)
Sodium: 137 mmol/L (ref 135–145)
Total Bilirubin: 0.7 mg/dL (ref 0.0–1.2)
Total Protein: 6.4 g/dL — ABNORMAL LOW (ref 6.5–8.1)

## 2023-04-01 LAB — SAMPLE TO BLOOD BANK

## 2023-04-01 LAB — CBC WITH DIFFERENTIAL (CANCER CENTER ONLY)
Abs Immature Granulocytes: 0.01 10*3/uL (ref 0.00–0.07)
Basophils Absolute: 0 10*3/uL (ref 0.0–0.1)
Basophils Relative: 0 %
Eosinophils Absolute: 0 10*3/uL (ref 0.0–0.5)
Eosinophils Relative: 0 %
HCT: 30.1 % — ABNORMAL LOW (ref 36.0–46.0)
Hemoglobin: 9.2 g/dL — ABNORMAL LOW (ref 12.0–15.0)
Immature Granulocytes: 0 %
Lymphocytes Relative: 15 %
Lymphs Abs: 0.6 10*3/uL — ABNORMAL LOW (ref 0.7–4.0)
MCH: 25.6 pg — ABNORMAL LOW (ref 26.0–34.0)
MCHC: 30.6 g/dL (ref 30.0–36.0)
MCV: 83.8 fL (ref 80.0–100.0)
Monocytes Absolute: 0.2 10*3/uL (ref 0.1–1.0)
Monocytes Relative: 5 %
Neutro Abs: 3.1 10*3/uL (ref 1.7–7.7)
Neutrophils Relative %: 80 %
Platelet Count: 72 10*3/uL — ABNORMAL LOW (ref 150–400)
RBC: 3.59 MIL/uL — ABNORMAL LOW (ref 3.87–5.11)
RDW: 18.9 % — ABNORMAL HIGH (ref 11.5–15.5)
WBC Count: 3.9 10*3/uL — ABNORMAL LOW (ref 4.0–10.5)
nRBC: 0 % (ref 0.0–0.2)

## 2023-04-01 MED ORDER — SODIUM CHLORIDE 0.9 % IV SOLN: Freq: Once | INTRAVENOUS | Status: AC

## 2023-04-01 MED ORDER — DEXTROSE 5 % IV SOLN
56.0000 mg/m2 | Freq: Once | INTRAVENOUS | Status: AC
  Administered 2023-04-01: 120 mg via INTRAVENOUS
  Filled 2023-04-01: qty 60

## 2023-04-01 MED ORDER — SODIUM CHLORIDE 0.9% FLUSH
10.0000 mL | INTRAVENOUS | Status: DC | PRN
  Administered 2023-04-01: 10 mL via INTRAVENOUS

## 2023-04-01 NOTE — Progress Notes (Signed)
 Patient took Dex at home prior to chemotherapy

## 2023-04-01 NOTE — Patient Instructions (Signed)
 [facility] - A DEPT OF [facility]  Discharge Instructions: Thank you for choosing [facility] to provide your oncology and hematology care.   If you have a lab appointment with the Cancer Center, please go directly to the Cancer Center and check in at the registration area.   Wear comfortable clothing and clothing appropriate for easy access to any Portacath or PICC line.   We strive to give you quality time with your provider. You may need to reschedule your appointment if you arrive late (15 or more minutes).  Arriving late affects you and other patients whose appointments are after yours.  Also, if you miss three or more appointments without notifying the office, you may be dismissed from the clinic at the provider's discretion.      For prescription refill requests, have your pharmacy contact our office and allow 72 hours for refills to be completed.    Today you received the following chemotherapy and/or immunotherapy agent: Carfilzomib (Kyprolis)      To help prevent nausea and vomiting after your treatment, we encourage you to take your nausea medication as directed.  BELOW ARE SYMPTOMS THAT SHOULD BE REPORTED IMMEDIATELY: *FEVER GREATER THAN 100.4 F (38 C) OR HIGHER *CHILLS OR SWEATING *NAUSEA AND VOMITING THAT IS NOT CONTROLLED WITH YOUR NAUSEA MEDICATION *UNUSUAL SHORTNESS OF BREATH *UNUSUAL BRUISING OR BLEEDING *URINARY PROBLEMS (pain or burning when urinating, or frequent urination) *BOWEL PROBLEMS (unusual diarrhea, constipation, pain near the anus) TENDERNESS IN MOUTH AND THROAT WITH OR WITHOUT PRESENCE OF ULCERS (sore throat, sores in mouth, or a toothache) UNUSUAL RASH, SWELLING OR PAIN  UNUSUAL VAGINAL DISCHARGE OR ITCHING   Items with * indicate a potential emergency and should be followed up as soon as possible or go to the Emergency Department if any problems should occur.  Please show the CHEMOTHERAPY ALERT CARD or  IMMUNOTHERAPY ALERT CARD at check-in to the Emergency Department and triage nurse.  Should you have questions after your visit or need to cancel or reschedule your appointment, please contact [facility] - A DEPT OF [facility]  Dept: [phone]  and follow the prompts.  Office hours are 8:00 a.m. to 4:30 p.m. Monday - Friday. Please note that voicemails left after 4:00 p.m. may not be returned until the following business day.  We are closed weekends and major holidays. You have access to a nurse at all times for urgent questions. Please call the main number to the clinic Dept: [phone] and follow the prompts.   For any non-urgent questions, you may also contact your provider using MyChart. We now offer e-Visits for anyone 22 and older to request care online for non-urgent symptoms. For details visit [email or web address].   Also download the MyChart app! Go to the app store, search "MyChart", open the app, select [place], and log in with your MyChart username and password.  Carfilzomib Injection What is this medication? CARFILZOMIB (kar FILZ oh mib) treats multiple myeloma, a type of bone marrow cancer. It works by blocking a protein that causes cancer cells to grow and multiply. This helps to slow or stop the spread of cancer cells. This medicine may be used for other purposes; ask your health care provider or pharmacist if you have questions. COMMON BRAND NAME(S): KYPROLIS What should I tell my care team before I take this medication? They need to know if you have any of these conditions:  Heart disease History of blood clots Irregular heartbeat Kidney disease Liver disease Lung or breathing disease An unusual or allergic reaction to carfilzomib, or other medications, foods, dyes, or preservatives If you or your partner are pregnant or trying to get pregnant Breastfeeding How should I use this medication? This medication is injected into a  vein. It is given by your care team in a hospital or clinic setting. Talk to your care team about the use of this medication in children. Special care may be needed. Overdosage: If you think you have taken too much of this medicine contact a poison control center or emergency room at once. NOTE: This medicine is only for you. Do not share this medicine with others. What if I miss a dose? Keep appointments for follow-up doses. It is important not to miss your dose. Call your care team if you are unable to keep an appointment. What may interact with this medication? Interactions are not expected. This list may not describe all possible interactions. Give your health care provider a list of all the medicines, herbs, non-prescription drugs, or dietary supplements you use. Also tell them if you smoke, drink alcohol, or use illegal drugs. Some items may interact with your medicine. What should I watch for while using this medication? Your condition will be monitored carefully while you are receiving this medication. You may need blood work while taking this medication. Check with your care team if you have severe diarrhea, nausea, and vomiting, or if you sweat a lot. The loss of too much body fluid may make it dangerous for you to take this medication. This medication may affect your coordination, reaction time, or judgment. Do not drive or operate machinery until you know how this medication affects you. Sit up or stand slowly to reduce the risk of dizzy or fainting spells. Drinking alcohol with this medication can increase the risk of these side effects. Talk to your care team if you may be pregnant. Serious birth defects can occur if you take this medication during pregnancy and for 6 months after the last dose. You will need a negative pregnancy test before starting this medication. Contraception is recommended while taking this medication and for 6 months after the last dose. Your care team can help you  find an option that works for you. If your partner can get pregnant, use a condom during sex while taking this medication and for 3 months after the last dose. Do not breastfeed while taking this medication and for 2 weeks after the last dose. This medication may cause infertility. Talk to your care team if you are concerned about your fertility. What side effects may I notice from receiving this medication? Side effects that you should report to your care team as soon as possible: Allergic reactions--skin rash, itching, hives, swelling of the face, lips, tongue, or throat Bleeding--bloody or black, tar-like stools, vomiting blood or brown material that looks like coffee grounds, red or dark brown urine, small red or purple spots on skin, unusual bruising or bleeding Blood clot--pain, swelling, or warmth in the leg, shortness of breath, chest pain Dizziness, loss of balance or coordination, confusion or trouble speaking Heart attack--pain or tightness in the chest, shoulders, arms, or jaw, nausea, shortness of breath, cold or clammy skin, feeling faint or lightheaded Heart failure--shortness of breath, swelling of the ankles, feet, or hands, sudden weight gain, unusual weakness or fatigue Heart rhythm changes--fast or irregular heartbeat, dizziness, feeling faint or lightheaded, chest pain, trouble breathing  Increase in blood pressure Infection--fever, chills, cough, sore throat, wounds that don't heal, pain or trouble when passing urine, general feeling of discomfort or being unwell Infusion reactions--chest pain, shortness of breath or trouble breathing, feeling faint or lightheaded Kidney injury--decrease in the amount of urine, swelling of the ankles, hands, or feet Liver injury--right upper belly pain, loss of appetite, nausea, light-colored stool, dark yellow or brown urine, yellowing skin or eyes, unusual weakness or fatigue Lung injury--shortness of breath or trouble breathing, cough,  spitting up blood, chest pain, fever Pulmonary hypertension--shortness of breath, chest pain, fast or irregular heartbeat, feeling faint or lightheaded, fatigue, swelling of the ankles or feet Stomach pain, bloody diarrhea, pale skin, unusual weakness or fatigue, decrease in the amount of urine, which may be signs of hemolytic uremic syndrome Sudden and severe headache, confusion, change in vision, seizures, which may be signs of posterior reversible encephalopathy syndrome (PRES) TTP--purple spots on the skin or inside the mouth, pale skin, yellowing skin or eyes, unusual weakness or fatigue, fever, fast or irregular heartbeat, confusion, change in vision, trouble speaking, trouble walking Tumor lysis syndrome (TLS)--nausea, vomiting, diarrhea, decrease in the amount of urine, dark urine, unusual weakness or fatigue, confusion, muscle pain or cramps, fast or irregular heartbeat, joint pain Side effects that usually do not require medical attention (report to your care team if they continue or are bothersome): Diarrhea Fatigue Nausea Trouble sleeping This list may not describe all possible side effects. Call your doctor for medical advice about side effects. You may report side effects to FDA at 1-800-FDA-1088. Where should I keep my medication? This medication is given in a hospital or clinic. It will not be stored at home. NOTE: This sheet is a summary. It may not cover all possible information. If you have questions about this medicine, talk to your doctor, pharmacist, or health care provider.  2024 Elsevier/Gold Standard (2021-05-25 00:00:00)

## 2023-04-04 ENCOUNTER — Encounter: Payer: Self-pay | Admitting: Internal Medicine

## 2023-04-04 ENCOUNTER — Other Ambulatory Visit: Payer: Self-pay

## 2023-04-04 DIAGNOSIS — C9000 Multiple myeloma not having achieved remission: Secondary | ICD-10-CM

## 2023-04-04 MED ORDER — ONDANSETRON HCL 8 MG PO TABS: 8.0000 mg | ORAL_TABLET | Freq: Three times a day (TID) | ORAL | 1 refills | Status: DC | PRN

## 2023-04-08 ENCOUNTER — Inpatient Hospital Stay: Payer: 59

## 2023-04-08 ENCOUNTER — Inpatient Hospital Stay (HOSPITAL_BASED_OUTPATIENT_CLINIC_OR_DEPARTMENT_OTHER): Payer: 59 | Admitting: Internal Medicine

## 2023-04-08 VITALS — BP 119/52 | HR 58 | Temp 98.2°F | Resp 17 | Ht 67.0 in | Wt 260.8 lb

## 2023-04-08 DIAGNOSIS — D649 Anemia, unspecified: Secondary | ICD-10-CM

## 2023-04-08 DIAGNOSIS — Z5112 Encounter for antineoplastic immunotherapy: Secondary | ICD-10-CM | POA: Diagnosis not present

## 2023-04-08 DIAGNOSIS — C9000 Multiple myeloma not having achieved remission: Secondary | ICD-10-CM

## 2023-04-08 DIAGNOSIS — D701 Agranulocytosis secondary to cancer chemotherapy: Secondary | ICD-10-CM

## 2023-04-08 DIAGNOSIS — Z95828 Presence of other vascular implants and grafts: Secondary | ICD-10-CM

## 2023-04-08 LAB — CBC WITH DIFFERENTIAL (CANCER CENTER ONLY)
Abs Immature Granulocytes: 0.01 10*3/uL (ref 0.00–0.07)
Basophils Absolute: 0 10*3/uL (ref 0.0–0.1)
Basophils Relative: 0 %
Eosinophils Absolute: 0 10*3/uL (ref 0.0–0.5)
Eosinophils Relative: 0 %
HCT: 28.1 % — ABNORMAL LOW (ref 36.0–46.0)
Hemoglobin: 8.8 g/dL — ABNORMAL LOW (ref 12.0–15.0)
Immature Granulocytes: 0 %
Lymphocytes Relative: 16 %
Lymphs Abs: 0.6 10*3/uL — ABNORMAL LOW (ref 0.7–4.0)
MCH: 26.6 pg (ref 26.0–34.0)
MCHC: 31.3 g/dL (ref 30.0–36.0)
MCV: 84.9 fL (ref 80.0–100.0)
Monocytes Absolute: 0.2 10*3/uL (ref 0.1–1.0)
Monocytes Relative: 7 %
Neutro Abs: 2.6 10*3/uL (ref 1.7–7.7)
Neutrophils Relative %: 77 %
Platelet Count: 72 10*3/uL — ABNORMAL LOW (ref 150–400)
RBC: 3.31 MIL/uL — ABNORMAL LOW (ref 3.87–5.11)
RDW: 20.7 % — ABNORMAL HIGH (ref 11.5–15.5)
WBC Count: 3.4 10*3/uL — ABNORMAL LOW (ref 4.0–10.5)
nRBC: 0 % (ref 0.0–0.2)

## 2023-04-08 LAB — CMP (CANCER CENTER ONLY)
ALT: 32 U/L (ref 0–44)
AST: 38 U/L (ref 15–41)
Albumin: 4.2 g/dL (ref 3.5–5.0)
Alkaline Phosphatase: 142 U/L — ABNORMAL HIGH (ref 38–126)
Anion gap: 7 (ref 5–15)
BUN: 8 mg/dL (ref 8–23)
CO2: 25 mmol/L (ref 22–32)
Calcium: 9.1 mg/dL (ref 8.9–10.3)
Chloride: 105 mmol/L (ref 98–111)
Creatinine: 0.72 mg/dL (ref 0.44–1.00)
GFR, Estimated: >60 mL/min (ref >60)
Glucose, Bld: 184 mg/dL — ABNORMAL HIGH (ref 70–99)
Potassium: 4.3 mmol/L (ref 3.5–5.1)
Sodium: 137 mmol/L (ref 135–145)
Total Bilirubin: 0.7 mg/dL (ref 0.0–1.2)
Total Protein: 6.2 g/dL — ABNORMAL LOW (ref 6.5–8.1)

## 2023-04-08 LAB — SAMPLE TO BLOOD BANK

## 2023-04-08 MED ORDER — DEXTROSE 5 % IV SOLN
56.0000 mg/m2 | Freq: Once | INTRAVENOUS | Status: AC
  Administered 2023-04-08: 120 mg via INTRAVENOUS
  Filled 2023-04-08: qty 60

## 2023-04-08 MED ORDER — SODIUM CHLORIDE 0.9 % IV SOLN: Freq: Once | INTRAVENOUS | Status: AC

## 2023-04-08 MED ORDER — SODIUM CHLORIDE 0.9% FLUSH
10.0000 mL | INTRAVENOUS | Status: DC | PRN
Start: 2023-04-08 — End: 2023-04-08
  Administered 2023-04-08: 10 mL via INTRAVENOUS

## 2023-04-08 NOTE — Progress Notes (Signed)
 [facility] [phone]   [phone]  OFFICE PROGRESS NOTE  [name], MD [address] [place] [address]  DIAGNOSIS: Plasma cell dyscrasia initially diagnosed as MGUS in September 2010, with additional symptoms suggestive of POEMS syndrome.   PRIOR THERAPY:  1) Velcade 1.3 MG/M2 subcutaneously with Decadron 40 mg by mouth on a weekly basis. First cycle 11/24/2013. She status post 31 weekly doses of treatment. 2) Velcade 1.3 MG/M2 subcutaneously and weekly basis with Decadron 40 mg by mouth weekly. First dose 02/01/2015. Status post 28 cycles. 3) Revlimid 25 mg by mouth daily for 21 days every 4 weeks with weekly Decadron 20 mg. started in 11/27/2015. Status post 3 cycles discontinued secondary to lack of response. 4) Systemic treatment with Velcade 1.3 MG/KG weekly, Revlimid 25 mg by mouth daily for 21 days every 4 weeks in addition to Decadron 20 mg by mouth weekly. First dose 03/06/2016. Status post 42 cycles.  She has a break off treatment from June 2021 until July 2022. 5) Second line treatment with daratumumab, Pomalyst 3 mg for 21 days every 4 weeks as well as Decadron 20 mg p.o. weekly.  First dose 09/26/2022.  Status post 1 cycle and currently receiving day 15 of cycle #2.  CURRENT THERAPY: Third treatment with selinexor, carfilzomib and dex  28-day cycle. Carfilzomib 56 mg/m2 IV over 30 minutes on days 1, 8 and 15 (20 mg/m2 with cycle 1, day 1). Escalate to 70 mg/m2 with cycle 2 or 3 as tolerated. Selinexor 60 mg PO on days 1, 8, 15 and 22. Escalate to 80 mg weekly as tolerated. Dex 20 mg PO on days 1, 8, 15 and 22.  She is status post 1 cycle and she is here today for evaluation before day 15 of cycle #2  INTERVAL HISTORY: Brenda Conner 63 y.o. female returns to the clinic today for follow-up visit.Discussed the use of AI scribe software for clinical note transcription with the patient, who gave verbal consent to  proceed.  History of Present Illness   Brenda Conner is a 63 year old female with multiple myeloma who presents for day fifteen of cycle two of her third line treatment.  She is undergoing her third line of treatment for multiple myeloma with Selinexor, Carfilzomib, and Decadron. She has completed one cycle and is currently on day fifteen of cycle two.  Approximately two to three weeks ago, she experienced a burst of energy, which coincided with a week off from infusion and after receiving a blood transfusion. She is curious to see if this pattern repeats during her next week off from infusion.  Her current medication regimen includes Selinexor and Decadron, both taken once a week. Her platelet count is low at 72,000, similar to last week, but treatment is continuing. Her hemoglobin is 8.8 and white blood count is 3.4.  She is scheduled to see dr. Marissa Calamity on April 17th, who may perform a myeloma panel.        MEDICAL HISTORY: Past Medical History:  Diagnosis Date   Acid reflux    Anemia    Anxiety    Asthma    Cancer (HCC)    waldenstroms/ macroglobinulemia   Depression    Depression    Diabetes mellitus without complication (HCC)    Dysuria 02/28/2016   Gallstones    Heart palpitations    Hypercholesteremia    Hypertension    Hypothyroidism  Macroglobulinemia    ? POEMS syndrome   Monoclonal gammopathy of unknown significance (MGUS)    Multiple myeloma (HCC)    Obesity    POEMS syndrome    PONV (postoperative nausea and vomiting)    Sleep apnea    CPAP at bedtime    ALLERGIES:  is allergic to codeine, hydrocodone, lortab [hydrocodone-acetaminophen], onion, shellfish allergy, and amoxicillin.  MEDICATIONS:  Current Outpatient Medications  Medication Sig Dispense Refill   ACCU-CHEK AVIVA PLUS test strip USE TO CHECK SUGAR TWICE A DAY 90     acyclovir (ZOVIRAX) 400 MG tablet Take 1 tablet (400 mg total) by mouth daily. 30 tablet 3   ALPRAZolam (XANAX) 0.5 MG  tablet Take 1 tablet (0.5 mg total) by mouth at bedtime as needed for anxiety. 30 tablet 1   aspirin 81 MG chewable tablet Chew by mouth daily.     Azelastine HCl 0.15 % SOLN as needed.     B-D UF III MINI PEN NEEDLES 31G X 5 MM MISC      Blood Glucose Monitoring Suppl (ONE TOUCH ULTRA MINI) W/DEVICE KIT See admin instructions. Reported on 01/25/2015  0   cetirizine (ZYRTEC ALLERGY) 10 MG tablet Take 1 tablet by mouth daily.     Continuous Glucose Sensor (FREESTYLE LIBRE 3 SENSOR) MISC USE 1 SENSOR EVERY 14 DAYS     dexamethasone (DECADRON) 4 MG tablet Take 5 tablets (20 mg total) by mouth once a week. Take the day after darzalex faspro. Take with breakfast. 20 tablet 11   dexamethasone (DECADRON) 4 MG tablet Take 5 tablets (20mg ) on days 1, 8, 15 and 22 every 28 days 20 tablet 4   DiphenhydrAMINE HCl (BENADRYL ALLERGY PO) Take 1 Dose by mouth daily at 6 (six) AM.     fexofenadine (ALLEGRA ALLERGY) 180 MG tablet Take 1 tablet by mouth daily.     Fluticasone-Salmeterol (ADVAIR) 100-50 MCG/DOSE AEPB Inhale 2 puffs into the lungs every 12 (twelve) hours.     glucosamine-chondroitin 500-400 MG tablet Take 1 tablet by mouth daily.     ibuprofen (ADVIL,MOTRIN) 100 MG tablet Take 100 mg by mouth every 6 (six) hours as needed. Reported on 05/31/2015     Insulin Glargine (BASAGLAR KWIKPEN) 100 UNIT/ML Inject 20 Units into the skin at bedtime.     levothyroxine (SYNTHROID) 137 MCG tablet Take 137 mcg by mouth daily.     lidocaine-prilocaine (EMLA) cream Apply 1 Application topically as needed. 30 g 2   lisinopril (PRINIVIL,ZESTRIL) 10 MG tablet Take 10 mg by mouth daily. auth number 05/29/2018 [id number]  3   loperamide (IMODIUM) 2 MG capsule Take 2 mg by mouth as needed for diarrhea or loose stools.     magic mouthwash w/lidocaine SOLN Take 5 mLs by mouth 4 (four) times daily as needed for mouth pain. Swish, Gargle, and spit 240 mL 1   Melatonin 5 MG CAPS Take 1 capsule by mouth at bedtime.     metFORMIN  (GLUCOPHAGE-XR) 500 MG 24 hr tablet Take 500 mg by mouth daily.     NOVOLOG FLEXPEN 100 UNIT/ML FlexPen Inject 15 Units into the skin in the morning, at noon, and at bedtime. Per Sliding Scale     OLANZapine (ZYPREXA) 5 MG tablet TAKE ONE TABLET THE DAY BEFORE, THE DAY OF, AND THE DAY AFTER SELINEXOR. 90 tablet 1   omeprazole (PRILOSEC) 20 MG capsule Take 20 mg by mouth 2 (two) times daily.     ondansetron (ZOFRAN) 8 MG tablet Take  1 tablet (8 mg total) by mouth every 8 (eight) hours as needed for nausea or vomiting. 90 tablet 1   pravastatin (PRAVACHOL) 80 MG tablet Take 80 mg by mouth daily.     PROAIR HFA 108 (90 Base) MCG/ACT inhaler Reported on 06/21/2015  1   prochlorperazine (COMPAZINE) 10 MG tablet Take 1 tablet (10 mg total) by mouth every 6 (six) hours as needed for nausea or vomiting. 30 tablet 1   selinexor (XPOVIO) Therapy Pack (60 mg once weekly) Take 1 tablet (60 mg total) by mouth once a week. 4 tablet 0   sertraline (ZOLOFT) 50 MG tablet TAKE 3 TABLETS BY MOUTH EVERY DAY 270 tablet 1   verapamil (CALAN-SR) 240 MG CR tablet Take 240 mg by mouth 2 (two) times daily.     No current facility-administered medications for this visit.   Facility-Administered Medications Ordered in Other Visits  Medication Dose Route Frequency Provider Last Rate Last Admin   sodium chloride flush (NS) 0.9 % injection 10 mL  10 mL Intravenous PRN Si Gaul, MD   10 mL at 04/08/23 7846    SURGICAL HISTORY:  Past Surgical History:  Procedure Laterality Date   ABLATION  09/09/2007   HTA and polyp resection   BONE MARROW BIOPSY  04/08/2012   BONE MARROW BIOPSY  01/2020   CATARACT EXTRACTION, BILATERAL Bilateral 02/2022   FOOT SURGERY Bilateral 01/09/1996   small toe   IR IMAGING GUIDED PORT INSERTION  03/08/2023   KNEE ARTHROSCOPY W/ MENISCAL REPAIR Bilateral 10/10 ; 3/11   LAPAROSCOPIC CHOLECYSTECTOMY  01/09/1995   LUMBAR DISC SURGERY  03/08/2004   herniation, L4-L5   NASAL SINUS SURGERY   01/08/2002   UTERINE FIBROID EMBOLIZATION  2009   HTA and polyp resection    REVIEW OF SYSTEMS:  A comprehensive review of systems was negative except for: Constitutional: positive for fatigue   PHYSICAL EXAMINATION: General appearance: alert, cooperative, fatigued, and no distress Head: Normocephalic, without obvious abnormality, atraumatic Neck: no adenopathy Lymph nodes: Cervical, supraclavicular, and axillary nodes normal. Resp: clear to auscultation bilaterally Back: symmetric, no curvature. ROM normal. No CVA tenderness. Cardio: regular rate and rhythm, S1, S2 normal, no murmur, click, rub or gallop GI: soft, non-tender; bowel sounds normal; no masses,  no organomegaly Extremities: extremities normal, atraumatic, no cyanosis or edema  ECOG PERFORMANCE STATUS: 1 - Symptomatic but completely ambulatory  Blood pressure (!) 119/52, pulse (!) 58, temperature 98.2 F (36.8 C), temperature source Temporal, resp. rate 17, height 5\' 7"  (1.702 m), weight 260 lb 12.8 oz (118.3 kg), SpO2 100%.  LABORATORY DATA: Lab Results  Component Value Date   WBC 3.9 (L) 04/01/2023   HGB 9.2 (L) 04/01/2023   HCT 30.1 (L) 04/01/2023   MCV 83.8 04/01/2023   PLT 72 (L) 04/01/2023      Chemistry      Component Value Date/Time   NA 137 04/01/2023 1021   NA 138 11/14/2016 0824   K 4.3 04/01/2023 1021   K 4.3 11/14/2016 0824   CL 106 04/01/2023 1021   CL 104 04/07/2012 1415   CO2 25 04/01/2023 1021   CO2 23 11/14/2016 0824   BUN 7 (L) 04/01/2023 1021   BUN 9.1 11/14/2016 0824   CREATININE 0.74 04/01/2023 1021   CREATININE 0.7 11/14/2016 0824      Component Value Date/Time   CALCIUM 8.7 (L) 04/01/2023 1021   CALCIUM 9.6 11/14/2016 0824   ALKPHOS 133 (H) 04/01/2023 1021   ALKPHOS 94 11/14/2016  0824   AST 27 04/01/2023 1021   AST 28 11/14/2016 0824   ALT 22 04/01/2023 1021   ALT 24 11/14/2016 0824   BILITOT 0.7 04/01/2023 1021   BILITOT 0.41 11/14/2016 0824      ASSESSMENT AND PLAN:   This is a very pleasant 63 years old white female with multiple myeloma with questionable POEMS syndrome symptom.  The patient has been on treatment with subcutaneous weekly Velcade as well as Revlimid and Decadron status post 34 cycles.  She has been tolerating this treatment well. The patient has been on observation for the last several months and she has been doing fine with no concerning issues except for the recent upper respiratory infection and viral gastroenteritis. Her myeloma panel showed continuous increase in her free lambda light chain. The patient had a bone marrow biopsy and aspirate recently that showed 3-5% plasma cells still suspicious for plasma cell dyscrasia.  The skeletal bone survey was negative for any lytic lesions. The patient has been off treatment for more than a year between June 2021 until July 2022. She had repeat myeloma panel that showed significant worsening and increase of her lambda light chain.  She continues to have anemia but no renal insufficiency or hypercalcemia. She resumed her treatment with Velcade, Revlimid and Decadron on July 19, 2020.   The patient is currently on observation and she is feeling fine with no concerning complaints. She was seen recently by Dr. Marissa Calamity at [facility] for second opinion and after extensive investigation is recommended for her to resume her treatment for the multiple myeloma with daratumumab, Pomalyst 3 Mg p.o. daily for 21 days every 4 weeks and Decadron 20 mg weekly.  She is status post 2 cycles.  Her treatment was discontinued secondary to disease progression. The patient started third line treatment with selinexor, carfilzomib as well as Decadron.  She is status post 1 cycle and she is here for day 15 of cycle #2    Multiple Myeloma Brenda Conner, a 63 year old female with multiple myeloma, initially diagnosed as MGUS in September 2010, is on third-line treatment with Selinexor, Carfilzomib, and Decadron. She  has completed one cycle and is on day 15 of cycle 2. Her platelet count is 72,000, similar to last week, but treatment will proceed as she has a week off to recover. Hemoglobin is 8.8 and white blood count is 3.4. Treatment is not held today despite low counts. She reported increased energy levels coinciding with a week off from infusion and after a blood transfusion, suggesting a positive response to the treatment schedule. - Continue current treatment regimen of Selinexor, Carfilzomib, and Decadron. - Defer myeloma panel to Dr. Marissa Calamity on April 17th. - Schedule next visit in two weeks for day one of cycle three.   The patient was advised to call immediately if she has any concerning symptoms in the interval. All questions were answered. The patient knows to call the clinic with any problems, questions or concerns. We can certainly see the patient much sooner if necessary.  Disclaimer: This note was dictated with voice recognition software. Similar sounding words can inadvertently be transcribed and may not be corrected upon review.

## 2023-04-08 NOTE — Patient Instructions (Signed)
 [facility] - A DEPT OF [facility]  Discharge Instructions: Thank you for choosing [facility] to provide your oncology and hematology care.   If you have a lab appointment with the Cancer Center, please go directly to the Cancer Center and check in at the registration area.   Wear comfortable clothing and clothing appropriate for easy access to any Portacath or PICC line.   We strive to give you quality time with your provider. You may need to reschedule your appointment if you arrive late (15 or more minutes).  Arriving late affects you and other patients whose appointments are after yours.  Also, if you miss three or more appointments without notifying the office, you may be dismissed from the clinic at the provider's discretion.      For prescription refill requests, have your pharmacy contact our office and allow 72 hours for refills to be completed.    Today you received the following chemotherapy and/or immunotherapy agents kyprolis      To help prevent nausea and vomiting after your treatment, we encourage you to take your nausea medication as directed.  BELOW ARE SYMPTOMS THAT SHOULD BE REPORTED IMMEDIATELY: *FEVER GREATER THAN 100.4 F (38 C) OR HIGHER *CHILLS OR SWEATING *NAUSEA AND VOMITING THAT IS NOT CONTROLLED WITH YOUR NAUSEA MEDICATION *UNUSUAL SHORTNESS OF BREATH *UNUSUAL BRUISING OR BLEEDING *URINARY PROBLEMS (pain or burning when urinating, or frequent urination) *BOWEL PROBLEMS (unusual diarrhea, constipation, pain near the anus) TENDERNESS IN MOUTH AND THROAT WITH OR WITHOUT PRESENCE OF ULCERS (sore throat, sores in mouth, or a toothache) UNUSUAL RASH, SWELLING OR PAIN  UNUSUAL VAGINAL DISCHARGE OR ITCHING   Items with * indicate a potential emergency and should be followed up as soon as possible or go to the Emergency Department if any problems should occur.  Please show the CHEMOTHERAPY ALERT CARD or IMMUNOTHERAPY  ALERT CARD at check-in to the Emergency Department and triage nurse.  Should you have questions after your visit or need to cancel or reschedule your appointment, please contact [facility] - A DEPT OF [facility]  Dept: [phone]  and follow the prompts.  Office hours are 8:00 a.m. to 4:30 p.m. Monday - Friday. Please note that voicemails left after 4:00 p.m. may not be returned until the following business day.  We are closed weekends and major holidays. You have access to a nurse at all times for urgent questions. Please call the main number to the clinic Dept: [phone] and follow the prompts.   For any non-urgent questions, you may also contact your provider using MyChart. We now offer e-Visits for anyone 80 and older to request care online for non-urgent symptoms. For details visit [email or web address].   Also download the MyChart app! Go to the app store, search "MyChart", open the app, select [facility], and log in with your MyChart username and password.

## 2023-04-08 NOTE — Progress Notes (Signed)
 Per Dr. Arbutus Ped , it is ok to treat pt today with Kyprolis and plts 72k

## 2023-04-18 NOTE — Progress Notes (Signed)
 [facility] OFFICE PROGRESS NOTE  Brenda Bloch, MD 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   PRIOR THERAPY: 1) Velcade 1.3 MG/M2 subcutaneously with Decadron 40 mg by mouth on a weekly basis. First cycle 11/24/2013. She status post 31 weekly doses of treatment. 2) Velcade 1.3 MG/M2 subcutaneously and weekly basis with Decadron 40 mg by mouth weekly. First dose 02/01/2015. Status post 28 cycles. 3) Revlimid 25 mg by mouth daily for 21 days every 4 weeks with weekly Decadron 20 mg. started in 11/27/2015. Status post 3 cycles discontinued secondary to lack of response. 4) Systemic treatment with Velcade 1.3 MG/KG weekly, Revlimid 25 mg by mouth daily for 21 days every 4 weeks in addition to Decadron 20 mg by mouth weekly. First dose 03/06/2016. Status post 42 cycles.  She has a break off treatment from June 2021 until July 2022. 5) Second line treatment with daratumumab, Pomalyst 3 mg for 21 days every 4 weeks as well as Decadron 20 mg p.o. weekly.  First dose 09/26/2022.  Status post 1 cycle and currently receiving day 15 of cycle #2.  CURRENT THERAPY: Third treatment with selinexor, carfilzomib and dex 28-day cycle. Carfilzomib 56 mg/m2 IV over 30 minutes on days 1, 8 and 15 (20 mg/m2 with cycle 1, day 1). Escalate to 70 mg/m2 with cycle 2 or 3 as tolerated. Selinexor 60 mg PO on days 1, 8, 15 and 22. Escalate to 80 mg weekly as tolerated. Dex 20 mg PO on days 1, 8, 15 and 22.   INTERVAL HISTORY: Brenda Conner 63 y.o. female returns to the clinic today for a follow-up visit.  The patient was last seen in the clinic by Brenda Conner on 04/08/23.   In summary, when the patient was seen by Brenda Conner in early February 2025 she is found to have disease progression of her multiple myeloma and after consultation with Dr. Pandora Conner at [facility]. She  recommended treatment with selinexor, carfilzomib and dex  28-day cycle.    She has been requiring some supportive care with blood transfusions due to worsening anemia.  She is scheduled to see Dr. Pandora Conner on 04/25/2023 who is going to repeat a myeloma panel at that time likely.   Since today is day 1 of cycle 3, she had a week off of infusions last week.  Therefore she reports that she does tend to feel better the week after having a break from treatment.  Overall her fatigue is stable to somewhat improved compared to prior.  He still does get some GI upset with treatment.  She experiences constipation for few days following her infusions followed by diarrhea.  She also has side effects related to steroids including increased thirst secondary to her diabetes.  Her primary doctor has adjusted her diabetes medication in response to this and she reports her blood sugar improved the following day.  She denies any fever or unexplained weight loss.  She denies any signs of infection.  Denies any abnormal bleeding or bruising.  She also reports that she has some more shortness of breath when she takes Decadron and palpitations.  But she states that this improves after wears off.  She does mention that the Decadron does help her allergies and joint pain.  Take Zyprexa and Zofran for her nausea. She also takes Xanax prescribed by her gynecologist for anxiety.  She takes iron supplements for anemia not daily. She is here today for evaluation and repeat blood work before undergoing #3.    MEDICAL HISTORY: Past Medical History:  Diagnosis Date   Acid reflux    Anemia    Anxiety    Asthma    Cancer (HCC)    waldenstroms/ macroglobinulemia   Depression    Depression    Diabetes mellitus without complication (HCC)    Dysuria 02/28/2016   Gallstones    Heart palpitations    Hypercholesteremia    Hypertension    Hypothyroidism    Macroglobulinemia    ? POEMS syndrome   Monoclonal gammopathy of unknown  significance (MGUS)    Multiple myeloma (HCC)    Obesity    POEMS syndrome    PONV (postoperative nausea and vomiting)    Sleep apnea    CPAP at bedtime    ALLERGIES:  is allergic to codeine, hydrocodone, lortab [hydrocodone-acetaminophen], onion, shellfish allergy, and amoxicillin.  MEDICATIONS:  Current Outpatient Medications  Medication Sig Dispense Refill   ACCU-CHEK AVIVA PLUS test strip USE TO CHECK SUGAR TWICE A DAY 90     acyclovir (ZOVIRAX) 400 MG tablet Take 1 tablet (400 mg total) by mouth daily. 30 tablet 3   ALPRAZolam (XANAX) 0.5 MG tablet Take 1 tablet (0.5 mg total) by mouth at bedtime as needed for anxiety. 30 tablet 1   aspirin 81 MG chewable tablet Chew by mouth daily.     Azelastine HCl 0.15 % SOLN as needed.     B-D UF III MINI PEN NEEDLES 31G X 5 MM MISC      Blood Glucose Monitoring Suppl (ONE TOUCH ULTRA MINI) W/DEVICE KIT See admin instructions. Reported on 01/25/2015  0   cetirizine (ZYRTEC ALLERGY) 10 MG tablet Take 1 tablet by mouth daily.     Continuous Glucose Sensor (FREESTYLE LIBRE 3 SENSOR) MISC USE 1 SENSOR EVERY 14 DAYS     dexamethasone (DECADRON) 4 MG tablet Take 5 tablets (20 mg total) by mouth once a week. Take the day after darzalex faspro. Take with breakfast. 20 tablet 11   dexamethasone (DECADRON) 4 MG tablet Take 5 tablets (20mg ) on days 1, 8, 15 and 22 every 28 days 20 tablet 4   DiphenhydrAMINE HCl (BENADRYL ALLERGY PO) Take 1 Dose by mouth daily at 6 (six) AM.     fexofenadine (ALLEGRA ALLERGY) 180 MG tablet Take 1 tablet by mouth daily.     Fluticasone-Salmeterol (ADVAIR) 100-50 MCG/DOSE AEPB Inhale 2 puffs into the lungs every 12 (twelve) hours.     glucosamine-chondroitin 500-400 MG tablet Take 1 tablet by mouth daily.     ibuprofen (ADVIL,MOTRIN) 100 MG tablet Take 100 mg by mouth every 6 (six) hours as needed. Reported on 05/31/2015     Insulin Glargine (BASAGLAR KWIKPEN) 100 UNIT/ML Inject 20 Units into the skin at bedtime.      levothyroxine (SYNTHROID) 137 MCG tablet Take 137 mcg by mouth daily.     lidocaine-prilocaine (EMLA) cream Apply 1 Application topically as needed. 30 g 2   lisinopril (PRINIVIL,ZESTRIL) 10 MG tablet Take 10 mg by mouth daily. auth number 05/29/2018 [id number]  3   loperamide (IMODIUM) 2 MG capsule Take 2 mg by mouth as needed for diarrhea or loose stools.     magic mouthwash w/lidocaine SOLN Take 5 mLs by mouth 4 (four) times daily as needed for mouth pain. Swish, Gargle, and spit 240 mL 1   Melatonin 5 MG CAPS Take  1 capsule by mouth at bedtime.     metFORMIN (GLUCOPHAGE-XR) 500 MG 24 hr tablet Take 500 mg by mouth daily.     NOVOLOG FLEXPEN 100 UNIT/ML FlexPen Inject 15 Units into the skin in the morning, at noon, and at bedtime. Per Sliding Scale     OLANZapine (ZYPREXA) 5 MG tablet TAKE ONE TABLET THE DAY BEFORE, THE DAY OF, AND THE DAY AFTER SELINEXOR. 90 tablet 1   omeprazole (PRILOSEC) 20 MG capsule Take 20 mg by mouth 2 (two) times daily.     ondansetron (ZOFRAN) 8 MG tablet Take 1 tablet (8 mg total) by mouth every 8 (eight) hours as needed for nausea or vomiting. 90 tablet 1   pravastatin (PRAVACHOL) 80 MG tablet Take 80 mg by mouth daily.     PROAIR HFA 108 (90 Base) MCG/ACT inhaler Reported on 06/21/2015  1   prochlorperazine (COMPAZINE) 10 MG tablet Take 1 tablet (10 mg total) by mouth every 6 (six) hours as needed for nausea or vomiting. 30 tablet 1   selinexor (XPOVIO) Therapy Pack (60 mg once weekly) Take 1 tablet (60 mg total) by mouth once a week. 4 tablet 0   sertraline (ZOLOFT) 50 MG tablet TAKE 3 TABLETS BY MOUTH EVERY DAY 270 tablet 1   verapamil (CALAN-SR) 240 MG CR tablet Take 240 mg by mouth 2 (two) times daily.     No current facility-administered medications for this visit.    SURGICAL HISTORY:  Past Surgical History:  Procedure Laterality Date   ABLATION  09/09/2007   HTA and polyp resection   BONE MARROW BIOPSY  04/08/2012   BONE MARROW BIOPSY  01/2020   CATARACT  EXTRACTION, BILATERAL Bilateral 02/2022   FOOT SURGERY Bilateral 01/09/1996   small toe   IR IMAGING GUIDED PORT INSERTION  03/08/2023   KNEE ARTHROSCOPY W/ MENISCAL REPAIR Bilateral 10/10 ; 3/11   LAPAROSCOPIC CHOLECYSTECTOMY  01/09/1995   LUMBAR DISC SURGERY  03/08/2004   herniation, L4-L5   NASAL SINUS SURGERY  01/08/2002   UTERINE FIBROID EMBOLIZATION  2009   HTA and polyp resection    REVIEW OF SYSTEMS:   Constitutional: Positive for fatigue. Negative for chills, fever and unexpected weight change.  HENT: Negative for mouth sores, nosebleeds, sore throat and trouble swallowing.   Eyes: Negative for eye problems and icterus.  Respiratory: Positive for intermittent dyspnea on exertion. Negative for cough, hemoptysis, and wheezing.   Cardiovascular: Negative for chest pain and leg swelling.  Gastrointestinal: Positive for chronic diarrhea and nausea. Positive for intermittent bloating and constipation. Negative for vomiting.  Genitourinary: Negative for bladder incontinence, difficulty urinating, dysuria, frequency and hematuria.   Musculoskeletal: Negative for back pain, gait problem, neck pain and neck stiffness.  Skin: Negative for itching and rash.  Neurological: Negative for dizziness, extremity weakness, gait problem, headaches, light-headedness and seizures.  Hematological: Negative for adenopathy. Does not bruise/bleed easily.  Psychiatric/Behavioral: Negative for confusion, depression and sleep disturbance. The patient is not nervous/anxious.       PHYSICAL EXAMINATION:  Blood pressure (!) 109/53, pulse (!) 57, temperature 97.8 F (36.6 C), temperature source Temporal, resp. rate 19, height 5\' 7"  (1.702 m), weight 258 lb (117 kg), SpO2 100%.  ECOG PERFORMANCE STATUS: 1  Physical Exam  Constitutional: Oriented to person, place, and time and well-developed, well-nourished, and in no distress.  HENT:  Head: Normocephalic and atraumatic.  Mouth/Throat: Oropharynx is clear  and moist. No oropharyngeal exudate.  Eyes: Conjunctivae are normal. Right eye exhibits no  discharge. Left eye exhibits no discharge. No scleral icterus.  Neck: Normal range of motion. Neck supple.  Cardiovascular: Normal rate, regular rhythm, normal heart sounds and intact distal pulses.   Pulmonary/Chest: Effort normal and breath sounds normal. No respiratory distress. No wheezes. No rales.  Abdominal: Soft. Bowel sounds are normal. Exhibits no distension and no mass. There is no tenderness.  Musculoskeletal: Normal range of motion. Exhibits no edema.  Lymphadenopathy:    No cervical adenopathy.  Neurological: Alert and oriented to person, place, and time. Exhibits normal muscle tone. Gait normal. Coordination normal.  Skin: Skin is warm and dry. No rash noted. Not diaphoretic. No erythema. No pallor.  Psychiatric: Mood, memory and judgment normal.  Vitals reviewed.  LABORATORY DATA: Lab Results  Component Value Date   WBC 3.7 (L) 04/22/2023   HGB 9.0 (L) 04/22/2023   HCT 27.9 (L) 04/22/2023   MCV 86.6 04/22/2023   PLT 105 (L) 04/22/2023      Chemistry      Component Value Date/Time   NA 137 04/22/2023 0956   NA 138 11/14/2016 0824   K 4.0 04/22/2023 0956   K 4.3 11/14/2016 0824   CL 105 04/22/2023 0956   CL 104 04/07/2012 1415   CO2 25 04/22/2023 0956   CO2 23 11/14/2016 0824   BUN 10 04/22/2023 0956   BUN 9.1 11/14/2016 0824   CREATININE 0.69 04/22/2023 0956   CREATININE 0.7 11/14/2016 0824      Component Value Date/Time   CALCIUM 9.0 04/22/2023 0956   CALCIUM 9.6 11/14/2016 0824   ALKPHOS 122 04/22/2023 0956   ALKPHOS 94 11/14/2016 0824   AST 26 04/22/2023 0956   AST 28 11/14/2016 0824   ALT 21 04/22/2023 0956   ALT 24 11/14/2016 0824   BILITOT 0.7 04/22/2023 0956   BILITOT 0.41 11/14/2016 0824       RADIOGRAPHIC STUDIES:  US Abdomen Limited RUQ (LIVER/GB) Result Date: 03/25/2023 CLINICAL DATA:  Cirrhosis EXAM: ULTRASOUND ABDOMEN LIMITED RIGHT UPPER  QUADRANT COMPARISON:  Abdominal ultrasound September 21, 2022 FINDINGS: Gallbladder: Prior cholecystectomy Common bile duct: Diameter: 4.8 mm Liver: Coarse, heterogeneous echogenic nodular appearing liver correlate with hepatocellular disease such as cirrhosis without parenchymal lesions or biliary dilatation. No masses. Portal vein is patent on color Doppler imaging with normal direction of blood flow towards the liver. Other: None. IMPRESSION: Cirrhotic liver. Electronically Signed   By: Shaaron Adler M.D.   On: 03/25/2023 11:58     ASSESSMENT/PLAN:  This is a very pleasant 63 year old Caucasian female with smoldering multiple myeloma with questionable POEMS syndrome. Her myeloma panel showed continuous increase in her free lambda light chain.    The patient previously underwent 107 cycles of subcutaneous weekly Velcade, as well as Revlimid and Decadron on and off over the last several years.   She had been off treatment since June 2021 until July 2022 in which she had evidence of disease progression.  Therefore, she was restarted on Velcade, Revlimid, and Decadron.  She has restarted treatment in July 2022 with cycle 107 of weekly velcade. She tolerated well without any concerning adverse side effects.  She was then given a break.   She was seen recently by Dr. Marissa Calamity at [facility] for second opinion and after extensive investigation is recommended for her to resume her treatment for the multiple myeloma with daratumumab, Pomalyst 3 Mg p.o. daily for 21 days every 4 weeks and Decadron 20 mg weekly. This was started in September 2024. This was  discontinued due to disease progression in December 2024.    She saw multiple myeloma expert, Dr. Dimitri France recently who recommended Selinexor (oral), carfilzomib (infusion), and Decadron was recommended. She started this on 02/25/23.   He is scheduled to follow-up with Dr. Herchel Loma later this week on 04/25/2023.    Labs were reviewed.  Her  platelet count is 105K today.  I did discuss her doses with Brenda Conner.  Given that she has had some worsening and symptomatic anemia with this treatment regimen, we will hold off on the step up dosing of her selinexor with this cycle.  She will continue with the 60 mg of selinexor for this cycle. She is seeing Dr. Pandora Conner later this week and will discuss this with him as well.    The patient is going to take her Zofran first thing in the morning to try and stay on top of any nausea. She also was encouraged to take Imodium should she develop diarrhea to avoid any dehydration or electrolyte abnormalities.   I have added standing orders for weekly sample blood bank should she require any blood transfusions.    She will continue taking her iron supplement.    We will see her back in before undergoing treatment with day 15 cycle #3.   The patient was advised to call immediately if she has any concerning symptoms in the interval. The patient voices understanding of current disease status and treatment options and is in agreement with the current care plan. All questions were answered. The patient knows to call the clinic with any problems, questions or concerns. We can certainly see the patient much sooner if necessary       No orders of the defined types were placed in this encounter.    The total time spent in the appointment was 20-29 minutes  Regina Coppolino L Giabella Duhart, PA-C 04/22/23

## 2023-04-22 ENCOUNTER — Inpatient Hospital Stay: Payer: 59 | Attending: Internal Medicine

## 2023-04-22 ENCOUNTER — Inpatient Hospital Stay: Payer: 59 | Admitting: Physician Assistant

## 2023-04-22 ENCOUNTER — Other Ambulatory Visit: Payer: Self-pay

## 2023-04-22 ENCOUNTER — Inpatient Hospital Stay: Payer: 59

## 2023-04-22 VITALS — BP 109/53 | HR 57 | Temp 97.8°F | Resp 19 | Ht 67.0 in | Wt 258.0 lb

## 2023-04-22 DIAGNOSIS — Z9049 Acquired absence of other specified parts of digestive tract: Secondary | ICD-10-CM | POA: Insufficient documentation

## 2023-04-22 DIAGNOSIS — Z5112 Encounter for antineoplastic immunotherapy: Secondary | ICD-10-CM | POA: Insufficient documentation

## 2023-04-22 DIAGNOSIS — K7460 Unspecified cirrhosis of liver: Secondary | ICD-10-CM | POA: Insufficient documentation

## 2023-04-22 DIAGNOSIS — D472 Monoclonal gammopathy: Secondary | ICD-10-CM | POA: Diagnosis present

## 2023-04-22 DIAGNOSIS — K30 Functional dyspepsia: Secondary | ICD-10-CM | POA: Diagnosis not present

## 2023-04-22 DIAGNOSIS — R002 Palpitations: Secondary | ICD-10-CM | POA: Diagnosis not present

## 2023-04-22 DIAGNOSIS — E669 Obesity, unspecified: Secondary | ICD-10-CM | POA: Diagnosis not present

## 2023-04-22 DIAGNOSIS — T451X5A Adverse effect of antineoplastic and immunosuppressive drugs, initial encounter: Secondary | ICD-10-CM

## 2023-04-22 DIAGNOSIS — F32A Depression, unspecified: Secondary | ICD-10-CM | POA: Insufficient documentation

## 2023-04-22 DIAGNOSIS — Z885 Allergy status to narcotic agent status: Secondary | ICD-10-CM | POA: Diagnosis not present

## 2023-04-22 DIAGNOSIS — Z5111 Encounter for antineoplastic chemotherapy: Secondary | ICD-10-CM

## 2023-04-22 DIAGNOSIS — Z79899 Other long term (current) drug therapy: Secondary | ICD-10-CM | POA: Insufficient documentation

## 2023-04-22 DIAGNOSIS — M2550 Pain in unspecified joint: Secondary | ICD-10-CM | POA: Diagnosis not present

## 2023-04-22 DIAGNOSIS — D649 Anemia, unspecified: Secondary | ICD-10-CM | POA: Insufficient documentation

## 2023-04-22 DIAGNOSIS — R110 Nausea: Secondary | ICD-10-CM | POA: Insufficient documentation

## 2023-04-22 DIAGNOSIS — C9000 Multiple myeloma not having achieved remission: Secondary | ICD-10-CM | POA: Diagnosis not present

## 2023-04-22 DIAGNOSIS — R058 Other specified cough: Secondary | ICD-10-CM | POA: Insufficient documentation

## 2023-04-22 DIAGNOSIS — Z95828 Presence of other vascular implants and grafts: Secondary | ICD-10-CM

## 2023-04-22 DIAGNOSIS — R5383 Other fatigue: Secondary | ICD-10-CM | POA: Insufficient documentation

## 2023-04-22 DIAGNOSIS — D709 Neutropenia, unspecified: Secondary | ICD-10-CM | POA: Diagnosis not present

## 2023-04-22 DIAGNOSIS — R0981 Nasal congestion: Secondary | ICD-10-CM | POA: Diagnosis not present

## 2023-04-22 DIAGNOSIS — Z880 Allergy status to penicillin: Secondary | ICD-10-CM | POA: Diagnosis not present

## 2023-04-22 DIAGNOSIS — Z7952 Long term (current) use of systemic steroids: Secondary | ICD-10-CM | POA: Insufficient documentation

## 2023-04-22 DIAGNOSIS — R197 Diarrhea, unspecified: Secondary | ICD-10-CM | POA: Diagnosis not present

## 2023-04-22 LAB — CBC WITH DIFFERENTIAL (CANCER CENTER ONLY)
Abs Immature Granulocytes: 0.01 10*3/uL (ref 0.00–0.07)
Basophils Absolute: 0 10*3/uL (ref 0.0–0.1)
Basophils Relative: 0 %
Eosinophils Absolute: 0 10*3/uL (ref 0.0–0.5)
Eosinophils Relative: 0 %
HCT: 27.9 % — ABNORMAL LOW (ref 36.0–46.0)
Hemoglobin: 9 g/dL — ABNORMAL LOW (ref 12.0–15.0)
Immature Granulocytes: 0 %
Lymphocytes Relative: 9 %
Lymphs Abs: 0.3 10*3/uL — ABNORMAL LOW (ref 0.7–4.0)
MCH: 28 pg (ref 26.0–34.0)
MCHC: 32.3 g/dL (ref 30.0–36.0)
MCV: 86.6 fL (ref 80.0–100.0)
Monocytes Absolute: 0.2 10*3/uL (ref 0.1–1.0)
Monocytes Relative: 5 %
Neutro Abs: 3.1 10*3/uL (ref 1.7–7.7)
Neutrophils Relative %: 86 %
Platelet Count: 105 10*3/uL — ABNORMAL LOW (ref 150–400)
RBC: 3.22 MIL/uL — ABNORMAL LOW (ref 3.87–5.11)
RDW: 21.7 % — ABNORMAL HIGH (ref 11.5–15.5)
WBC Count: 3.7 10*3/uL — ABNORMAL LOW (ref 4.0–10.5)
nRBC: 0 % (ref 0.0–0.2)

## 2023-04-22 LAB — CMP (CANCER CENTER ONLY)
ALT: 21 U/L (ref 0–44)
AST: 26 U/L (ref 15–41)
Albumin: 4.4 g/dL (ref 3.5–5.0)
Alkaline Phosphatase: 122 U/L (ref 38–126)
Anion gap: 7 (ref 5–15)
BUN: 10 mg/dL (ref 8–23)
CO2: 25 mmol/L (ref 22–32)
Calcium: 9 mg/dL (ref 8.9–10.3)
Chloride: 105 mmol/L (ref 98–111)
Creatinine: 0.69 mg/dL (ref 0.44–1.00)
GFR, Estimated: >60 mL/min (ref >60)
Glucose, Bld: 171 mg/dL — ABNORMAL HIGH (ref 70–99)
Potassium: 4 mmol/L (ref 3.5–5.1)
Sodium: 137 mmol/L (ref 135–145)
Total Bilirubin: 0.7 mg/dL (ref 0.0–1.2)
Total Protein: 6.5 g/dL (ref 6.5–8.1)

## 2023-04-22 LAB — SAMPLE TO BLOOD BANK

## 2023-04-22 MED ORDER — HEPARIN SOD (PORK) LOCK FLUSH 100 UNIT/ML IV SOLN
500.0000 [IU] | Freq: Once | INTRAVENOUS | Status: AC | PRN
  Administered 2023-04-22: 500 [IU]

## 2023-04-22 MED ORDER — DEXTROSE 5 % IV SOLN
56.0000 mg/m2 | Freq: Once | INTRAVENOUS | Status: AC
  Administered 2023-04-22: 120 mg via INTRAVENOUS
  Filled 2023-04-22: qty 60

## 2023-04-22 MED ORDER — SODIUM CHLORIDE 0.9% FLUSH
10.0000 mL | INTRAVENOUS | Status: DC | PRN
  Administered 2023-04-22: 10 mL via INTRAVENOUS

## 2023-04-22 MED ORDER — SODIUM CHLORIDE 0.9% FLUSH
10.0000 mL | INTRAVENOUS | Status: DC | PRN
Start: 2023-04-22 — End: 2023-04-22
  Administered 2023-04-22: 10 mL

## 2023-04-22 MED ORDER — SODIUM CHLORIDE 0.9 % IV SOLN: Freq: Once | INTRAVENOUS | Status: AC

## 2023-04-22 NOTE — Patient Instructions (Signed)

## 2023-04-23 ENCOUNTER — Other Ambulatory Visit: Payer: Self-pay

## 2023-04-29 ENCOUNTER — Inpatient Hospital Stay: Payer: 59

## 2023-04-29 ENCOUNTER — Other Ambulatory Visit: Payer: Self-pay | Admitting: Internal Medicine

## 2023-04-29 DIAGNOSIS — C9000 Multiple myeloma not having achieved remission: Secondary | ICD-10-CM

## 2023-04-29 DIAGNOSIS — Z5112 Encounter for antineoplastic immunotherapy: Secondary | ICD-10-CM | POA: Diagnosis not present

## 2023-04-29 LAB — CMP (CANCER CENTER ONLY)
ALT: 25 U/L (ref 0–44)
AST: 25 U/L (ref 15–41)
Albumin: 4.1 g/dL (ref 3.5–5.0)
Alkaline Phosphatase: 111 U/L (ref 38–126)
Anion gap: 5 (ref 5–15)
BUN: 11 mg/dL (ref 8–23)
CO2: 25 mmol/L (ref 22–32)
Calcium: 8.7 mg/dL — ABNORMAL LOW (ref 8.9–10.3)
Chloride: 106 mmol/L (ref 98–111)
Creatinine: 0.7 mg/dL (ref 0.44–1.00)
GFR, Estimated: >60 mL/min (ref >60)
Glucose, Bld: 203 mg/dL — ABNORMAL HIGH (ref 70–99)
Potassium: 4.1 mmol/L (ref 3.5–5.1)
Sodium: 136 mmol/L (ref 135–145)
Total Bilirubin: 0.7 mg/dL (ref 0.0–1.2)
Total Protein: 6 g/dL — ABNORMAL LOW (ref 6.5–8.1)

## 2023-04-29 LAB — CBC WITH DIFFERENTIAL (CANCER CENTER ONLY)
Abs Immature Granulocytes: 0 10*3/uL (ref 0.00–0.07)
Basophils Absolute: 0 10*3/uL (ref 0.0–0.1)
Basophils Relative: 0 %
Eosinophils Absolute: 0 10*3/uL (ref 0.0–0.5)
Eosinophils Relative: 0 %
HCT: 26.5 % — ABNORMAL LOW (ref 36.0–46.0)
Hemoglobin: 8.5 g/dL — ABNORMAL LOW (ref 12.0–15.0)
Immature Granulocytes: 0 %
Lymphocytes Relative: 17 %
Lymphs Abs: 0.5 10*3/uL — ABNORMAL LOW (ref 0.7–4.0)
MCH: 28.1 pg (ref 26.0–34.0)
MCHC: 32.1 g/dL (ref 30.0–36.0)
MCV: 87.5 fL (ref 80.0–100.0)
Monocytes Absolute: 0.3 10*3/uL (ref 0.1–1.0)
Monocytes Relative: 10 %
Neutro Abs: 1.9 10*3/uL (ref 1.7–7.7)
Neutrophils Relative %: 73 %
Platelet Count: 52 10*3/uL — ABNORMAL LOW (ref 150–400)
RBC: 3.03 MIL/uL — ABNORMAL LOW (ref 3.87–5.11)
RDW: 21.6 % — ABNORMAL HIGH (ref 11.5–15.5)
WBC Count: 2.6 10*3/uL — ABNORMAL LOW (ref 4.0–10.5)
nRBC: 0 % (ref 0.0–0.2)

## 2023-04-29 LAB — SAMPLE TO BLOOD BANK

## 2023-04-29 NOTE — Progress Notes (Signed)
 Pt presented for D8C3 Kyprolis . Platelets 52. Per Marguerita Shih, MD, no treatment today. VSS.Pt denies adverse s/s. Port deaccessed. Pt given copy of lab results and verbalized understanding. Aware of future apts. Pt ambulatory to lobby for d/c.

## 2023-05-06 ENCOUNTER — Inpatient Hospital Stay: Payer: 59

## 2023-05-06 ENCOUNTER — Inpatient Hospital Stay

## 2023-05-06 ENCOUNTER — Inpatient Hospital Stay: Payer: 59 | Admitting: Internal Medicine

## 2023-05-06 ENCOUNTER — Telehealth: Payer: Self-pay | Admitting: Internal Medicine

## 2023-05-06 ENCOUNTER — Encounter: Payer: Self-pay | Admitting: Internal Medicine

## 2023-05-06 VITALS — BP 151/74 | HR 77 | Temp 98.3°F | Resp 17 | Ht 67.0 in | Wt 257.1 lb

## 2023-05-06 DIAGNOSIS — C9000 Multiple myeloma not having achieved remission: Secondary | ICD-10-CM | POA: Diagnosis not present

## 2023-05-06 DIAGNOSIS — D701 Agranulocytosis secondary to cancer chemotherapy: Secondary | ICD-10-CM

## 2023-05-06 DIAGNOSIS — Z5112 Encounter for antineoplastic immunotherapy: Secondary | ICD-10-CM | POA: Diagnosis not present

## 2023-05-06 DIAGNOSIS — Z95828 Presence of other vascular implants and grafts: Secondary | ICD-10-CM

## 2023-05-06 LAB — CBC WITH DIFFERENTIAL (CANCER CENTER ONLY)
Abs Immature Granulocytes: 0.01 10*3/uL (ref 0.00–0.07)
Basophils Absolute: 0 10*3/uL (ref 0.0–0.1)
Basophils Relative: 0 %
Eosinophils Absolute: 0 10*3/uL (ref 0.0–0.5)
Eosinophils Relative: 0 %
HCT: 26.8 % — ABNORMAL LOW (ref 36.0–46.0)
Hemoglobin: 8.9 g/dL — ABNORMAL LOW (ref 12.0–15.0)
Immature Granulocytes: 0 %
Lymphocytes Relative: 16 %
Lymphs Abs: 0.5 10*3/uL — ABNORMAL LOW (ref 0.7–4.0)
MCH: 28.9 pg (ref 26.0–34.0)
MCHC: 33.2 g/dL (ref 30.0–36.0)
MCV: 87 fL (ref 80.0–100.0)
Monocytes Absolute: 0.3 10*3/uL (ref 0.1–1.0)
Monocytes Relative: 10 %
Neutro Abs: 2.2 10*3/uL (ref 1.7–7.7)
Neutrophils Relative %: 74 %
Platelet Count: 91 10*3/uL — ABNORMAL LOW (ref 150–400)
RBC: 3.08 MIL/uL — ABNORMAL LOW (ref 3.87–5.11)
RDW: 20.5 % — ABNORMAL HIGH (ref 11.5–15.5)
WBC Count: 2.9 10*3/uL — ABNORMAL LOW (ref 4.0–10.5)
nRBC: 0 % (ref 0.0–0.2)

## 2023-05-06 LAB — CMP (CANCER CENTER ONLY)
ALT: 48 U/L — ABNORMAL HIGH (ref 0–44)
AST: 40 U/L (ref 15–41)
Albumin: 4.3 g/dL (ref 3.5–5.0)
Alkaline Phosphatase: 143 U/L — ABNORMAL HIGH (ref 38–126)
Anion gap: 7 (ref 5–15)
BUN: 6 mg/dL — ABNORMAL LOW (ref 8–23)
CO2: 25 mmol/L (ref 22–32)
Calcium: 8.6 mg/dL — ABNORMAL LOW (ref 8.9–10.3)
Chloride: 105 mmol/L (ref 98–111)
Creatinine: 0.69 mg/dL (ref 0.44–1.00)
GFR, Estimated: >60 mL/min (ref >60)
Glucose, Bld: 182 mg/dL — ABNORMAL HIGH (ref 70–99)
Potassium: 4 mmol/L (ref 3.5–5.1)
Sodium: 137 mmol/L (ref 135–145)
Total Bilirubin: 0.7 mg/dL (ref 0.0–1.2)
Total Protein: 6.3 g/dL — ABNORMAL LOW (ref 6.5–8.1)

## 2023-05-06 LAB — SAMPLE TO BLOOD BANK

## 2023-05-06 MED ORDER — SODIUM CHLORIDE 0.9% FLUSH
10.0000 mL | INTRAVENOUS | Status: DC | PRN
  Administered 2023-05-06: 10 mL via INTRAVENOUS

## 2023-05-06 MED ORDER — AZITHROMYCIN 250 MG PO TABS: ORAL_TABLET | ORAL | 0 refills | Status: AC

## 2023-05-06 NOTE — Progress Notes (Signed)
 [facility] [phone]   [phone]  OFFICE PROGRESS NOTE  [name], MD [address] [place] [address]  DIAGNOSIS: Plasma cell dyscrasia initially diagnosed as MGUS in September 2010, with additional symptoms suggestive of POEMS syndrome.   PRIOR THERAPY:  1) Velcade  1.3 MG/M2 subcutaneously with Decadron  40 mg by mouth on a weekly basis. First cycle 11/24/2013. She status post 31 weekly doses of treatment. 2) Velcade  1.3 MG/M2 subcutaneously and weekly basis with Decadron  40 mg by mouth weekly. First dose 02/01/2015. Status post 28 cycles. 3) Revlimid  25 mg by mouth daily for 21 days every 4 weeks with weekly Decadron  20 mg. started in 11/27/2015. Status post 3 cycles discontinued secondary to lack of response. 4) Systemic treatment with Velcade  1.3 MG/KG weekly, Revlimid  25 mg by mouth daily for 21 days every 4 weeks in addition to Decadron  20 mg by mouth weekly. First dose 03/06/2016. Status post 42 cycles.  She has a break off treatment from June 2021 until July 2022. 5) Second line treatment with daratumumab , Pomalyst  3 mg for 21 days every 4 weeks as well as Decadron  20 mg p.o. weekly.  First dose 09/26/2022.  Status post 1 cycle and currently receiving day 15 of cycle #2.  CURRENT THERAPY: Third treatment with selinexor , carfilzomib  and dex  28-day cycle. Carfilzomib  56 mg/m2 IV over 30 minutes on days 1, 8 and 15 (20 mg/m2 with cycle 1, day 1). Escalate to 70 mg/m2 with cycle 2 or 3 as tolerated. Selinexor  60 mg PO on days 1, 8, 15 and 22. Escalate to 80 mg weekly as tolerated. Dex 20 mg PO on days 1, 8, 15 and 22.  She is status post 1 cycle and she is here today for evaluation before day 15 of cycle #2  INTERVAL HISTORY: Brenda Conner 63 y.o. female returns to the clinic today for follow-up visit. Discussed the use of AI scribe software for clinical note transcription with the patient, who gave verbal consent to  proceed.  History of Present Illness   Brenda Conner is a 63 year old female with multiple myeloma who presents for evaluation before starting day eight of cycle number three of her treatment.  She has a history of plasma cell dyscrasia consistent with multiple myeloma, initially diagnosed as MGUS in September 2010. She is currently on her third line of treatment with selinexor , carfilzomib , and Decadron  on 28-day cycles.  She is experiencing significant allergy symptoms that have progressed from her chest to her sinuses, resulting in plugged ears and persistent chest congestion. She has no fever, chills, or sweating. She has been coughing up dark yellow sputum and has nasal discharge with darker yellow mucus, but there is no blood in her sputum.  She is managing her symptoms with Mucinex, antihistamines, and Tylenol . Her recent blood work shows a white blood cell count of 2.9, an absolute neutrophil count of 2.2, hemoglobin at 8.9, and platelets increased to 91,000 from 50,000.       MEDICAL HISTORY: Past Medical History:  Diagnosis Date   Acid reflux    Anemia    Anxiety    Asthma    Cancer (HCC)    waldenstroms/ macroglobinulemia   Depression    Depression    Diabetes mellitus without complication (HCC)    Dysuria 02/28/2016   Gallstones    Heart palpitations    Hypercholesteremia    Hypertension  Hypothyroidism    Macroglobulinemia    ? POEMS syndrome   Monoclonal gammopathy of unknown significance (MGUS)    Multiple myeloma (HCC)    Obesity    POEMS syndrome    PONV (postoperative nausea and vomiting)    Sleep apnea    CPAP at bedtime    ALLERGIES:  is allergic to codeine, hydrocodone , lortab [hydrocodone -acetaminophen ], onion, shellfish allergy, and amoxicillin.  MEDICATIONS:  Current Outpatient Medications  Medication Sig Dispense Refill   ACCU-CHEK AVIVA PLUS test strip USE TO CHECK SUGAR TWICE A DAY 90     acyclovir  (ZOVIRAX ) 400 MG tablet Take 1 tablet  (400 mg total) by mouth daily. 30 tablet 3   ALPRAZolam  (XANAX ) 0.5 MG tablet Take 1 tablet (0.5 mg total) by mouth at bedtime as needed for anxiety. 30 tablet 1   aspirin 81 MG chewable tablet Chew by mouth daily.     Azelastine  HCl 0.15 % SOLN as needed.     B-D UF III MINI PEN NEEDLES 31G X 5 MM MISC      Blood Glucose Monitoring Suppl (ONE TOUCH ULTRA MINI) W/DEVICE KIT See admin instructions. Reported on 01/25/2015  0   cetirizine (ZYRTEC ALLERGY) 10 MG tablet Take 1 tablet by mouth daily.     Continuous Glucose Sensor (FREESTYLE LIBRE 3 SENSOR) MISC USE 1 SENSOR EVERY 14 DAYS     dexamethasone  (DECADRON ) 4 MG tablet Take 5 tablets (20 mg total) by mouth once a week. Take the day after darzalex  faspro. Take with breakfast. 20 tablet 11   dexamethasone  (DECADRON ) 4 MG tablet Take 5 tablets (20mg ) on days 1, 8, 15 and 22 every 28 days 20 tablet 4   DiphenhydrAMINE  HCl (BENADRYL  ALLERGY PO) Take 1 Dose by mouth daily at 6 (six) AM.     fexofenadine (ALLEGRA ALLERGY) 180 MG tablet Take 1 tablet by mouth daily.     Fluticasone-Salmeterol (ADVAIR ) 100-50 MCG/DOSE AEPB Inhale 2 puffs into the lungs every 12 (twelve) hours.     glucosamine-chondroitin 500-400 MG tablet Take 1 tablet by mouth daily.     ibuprofen (ADVIL,MOTRIN) 100 MG tablet Take 100 mg by mouth every 6 (six) hours as needed. Reported on 05/31/2015     Insulin Glargine  (BASAGLAR  KWIKPEN) 100 UNIT/ML Inject 20 Units into the skin at bedtime.     levothyroxine (SYNTHROID ) 137 MCG tablet Take 137 mcg by mouth daily.     lidocaine -prilocaine  (EMLA ) cream Apply 1 Application topically as needed. 30 g 2   lisinopril  (PRINIVIL ,ZESTRIL ) 10 MG tablet Take 10 mg by mouth daily. auth number 05/29/2018 [id number]  3   loperamide (IMODIUM) 2 MG capsule Take 2 mg by mouth as needed for diarrhea or loose stools.     magic mouthwash w/lidocaine  SOLN Take 5 mLs by mouth 4 (four) times daily as needed for mouth pain. Swish, Gargle, and spit 240 mL 1    Melatonin 5 MG CAPS Take 1 capsule by mouth at bedtime.     metFORMIN  (GLUCOPHAGE -XR) 500 MG 24 hr tablet Take 500 mg by mouth daily.     NOVOLOG  FLEXPEN 100 UNIT/ML FlexPen Inject 15 Units into the skin in the morning, at noon, and at bedtime. Per Sliding Scale     OLANZapine  (ZYPREXA ) 5 MG tablet TAKE ONE TABLET THE DAY BEFORE, THE DAY OF, AND THE DAY AFTER SELINEXOR . 90 tablet 1   omeprazole  (PRILOSEC) 20 MG capsule Take 20 mg by mouth 2 (two) times daily.     ondansetron  (ZOFRAN )  8 MG tablet Take 1 tablet (8 mg total) by mouth every 8 (eight) hours as needed for nausea or vomiting. 90 tablet 1   pravastatin  (PRAVACHOL ) 80 MG tablet Take 80 mg by mouth daily.     PROAIR  HFA 108 (90 Base) MCG/ACT inhaler Reported on 06/21/2015  1   prochlorperazine  (COMPAZINE ) 10 MG tablet Take 1 tablet (10 mg total) by mouth every 6 (six) hours as needed for nausea or vomiting. 30 tablet 1   selinexor  (XPOVIO ) Therapy Pack (60 mg once weekly) Take 1 tablet (60 mg total) by mouth once a week. 4 tablet 0   sertraline  (ZOLOFT ) 50 MG tablet TAKE 3 TABLETS BY MOUTH EVERY DAY 270 tablet 1   verapamil  (CALAN -SR) 240 MG CR tablet Take 240 mg by mouth 2 (two) times daily.     No current facility-administered medications for this visit.   Facility-Administered Medications Ordered in Other Visits  Medication Dose Route Frequency Provider Last Rate Last Admin   sodium chloride  flush (NS) 0.9 % injection 10 mL  10 mL Intravenous PRN Marlene Simas, MD   10 mL at 05/06/23 0943    SURGICAL HISTORY:  Past Surgical History:  Procedure Laterality Date   ABLATION  09/09/2007   HTA and polyp resection   BONE MARROW BIOPSY  04/08/2012   BONE MARROW BIOPSY  01/2020   CATARACT EXTRACTION, BILATERAL Bilateral 02/2022   FOOT SURGERY Bilateral 01/09/1996   small toe   IR IMAGING GUIDED PORT INSERTION  03/08/2023   KNEE ARTHROSCOPY W/ MENISCAL REPAIR Bilateral 10/10 ; 3/11   LAPAROSCOPIC CHOLECYSTECTOMY  01/09/1995   LUMBAR  DISC SURGERY  03/08/2004   herniation, L4-L5   NASAL SINUS SURGERY  01/08/2002   UTERINE FIBROID EMBOLIZATION  2009   HTA and polyp resection    REVIEW OF SYSTEMS:  Constitutional: positive for fatigue Eyes: negative Ears, nose, mouth, throat, and face: positive for nasal congestion Respiratory: positive for cough and sputum Cardiovascular: negative Gastrointestinal: negative Genitourinary:negative Integument/breast: negative Hematologic/lymphatic: negative Musculoskeletal:negative Neurological: negative Behavioral/Psych: negative Endocrine: negative Allergic/Immunologic: negative   PHYSICAL EXAMINATION: General appearance: alert, cooperative, fatigued, and no distress Head: Normocephalic, without obvious abnormality, atraumatic Neck: no adenopathy Lymph nodes: Cervical, supraclavicular, and axillary nodes normal. Resp: clear to auscultation bilaterally Back: symmetric, no curvature. ROM normal. No CVA tenderness. Cardio: regular rate and rhythm, S1, S2 normal, no murmur, click, rub or gallop GI: soft, non-tender; bowel sounds normal; no masses,  no organomegaly Extremities: extremities normal, atraumatic, no cyanosis or edema Neurologic: Alert and oriented X 3, normal strength and tone. Normal symmetric reflexes. Normal coordination and gait  ECOG PERFORMANCE STATUS: 1 - Symptomatic but completely ambulatory  Blood pressure (!) 151/74, pulse 77, temperature 98.3 F (36.8 C), temperature source Temporal, resp. rate 17, height 5\' 7"  (1.702 m), weight 257 lb 1.6 oz (116.6 kg), SpO2 100%.  LABORATORY DATA: Lab Results  Component Value Date   WBC 2.9 (L) 05/06/2023   HGB 8.9 (L) 05/06/2023   HCT 26.8 (L) 05/06/2023   MCV 87.0 05/06/2023   PLT 91 (L) 05/06/2023      Chemistry      Component Value Date/Time   NA 136 04/29/2023 0919   NA 138 11/14/2016 0824   K 4.1 04/29/2023 0919   K 4.3 11/14/2016 0824   CL 106 04/29/2023 0919   CL 104 04/07/2012 1415   CO2 25  04/29/2023 0919   CO2 23 11/14/2016 0824   BUN 11 04/29/2023 0919   BUN 9.1  11/14/2016 0824   CREATININE 0.70 04/29/2023 0919   CREATININE 0.7 11/14/2016 0824      Component Value Date/Time   CALCIUM 8.7 (L) 04/29/2023 0919   CALCIUM 9.6 11/14/2016 0824   ALKPHOS 111 04/29/2023 0919   ALKPHOS 94 11/14/2016 0824   AST 25 04/29/2023 0919   AST 28 11/14/2016 0824   ALT 25 04/29/2023 0919   ALT 24 11/14/2016 0824   BILITOT 0.7 04/29/2023 0919   BILITOT 0.41 11/14/2016 0824      ASSESSMENT AND PLAN:  This is a very pleasant 63 years old white female with multiple myeloma with questionable POEMS syndrome symptom.  The patient has been on treatment with subcutaneous weekly Velcade  as well as Revlimid  and Decadron  status post 34 cycles.  She has been tolerating this treatment well. The patient has been on observation for the last several months and she has been doing fine with no concerning issues except for the recent upper respiratory infection and viral gastroenteritis. Her myeloma panel showed continuous increase in her free lambda light chain. The patient had a bone marrow biopsy and aspirate recently that showed 3-5% plasma cells still suspicious for plasma cell dyscrasia.  The skeletal bone survey was negative for any lytic lesions. The patient has been off treatment for more than a year between June 2021 until July 2022. She had repeat myeloma panel that showed significant worsening and increase of her lambda light chain.  She continues to have anemia but no renal insufficiency or hypercalcemia. She resumed her treatment with Velcade , Revlimid  and Decadron  on July 19, 2020.   The patient is currently on observation and she is feeling fine with no concerning complaints. She was seen recently by Dr. Pandora Bogaert at [facility] for second opinion and after extensive investigation is recommended for her to resume her treatment for the multiple myeloma with daratumumab , Pomalyst  3 Mg  p.o. daily for 21 days every 4 weeks and Decadron  20 mg weekly.  She is status post 2 cycles.  Her treatment was discontinued secondary to disease progression. The patient started third line treatment with selinexor , carfilzomib  as well as Decadron .  She is status post 2 cycles. She had repeat myeloma panel performed at [facility] in [place]  that showed significant improvement of her disease on the current regimen.    Multiple myeloma Initially diagnosed as MGUS in September 2010. Currently on third-line treatment with selinexor , carfilzomib , and Decadron  on 28-day cycles. Missed a few treatments but showing good response with significant reduction in lambda light chains from 1500 to 274. Discussion about potential CAR T therapy and need for further evaluation with PET scan and bone marrow biopsy. Prefers bone marrow biopsy at current facility due to anesthesia availability. - Continue treatment with selinexor , carfilzomib , and Decadron  - Coordinate with referring physician regarding CAR T therapy - Order PET scan - Schedule bone marrow biopsy at current facility  Neutropenia White blood cell count is 2.9 with an absolute neutrophil count of 2.2, adequate for treatment. However, due to current symptoms, treatment is deferred.  Allergic rhinitis with chest congestion Reports worsening allergies with symptoms settling in the chest, moving to sinuses, and causing ear blockage. No fever, chills, or hemoptysis. Currently taking Mucinex, antihistamines, and Tylenol . Cough productive of dark yellow sputum, raising concern for possible bronchitis or infection. Treatment deferred due to symptoms. - Prescribe azithromycin  (Z-Pak) for potential bronchitis or infection - Reschedule treatment for next week   The patient was advised to call  immediately if she has any concerning symptoms in the interval. All questions were answered. The patient knows to call the clinic with any  problems, questions or concerns. We can certainly see the patient much sooner if necessary.  Disclaimer: This note was dictated with voice recognition software. Similar sounding words can inadvertently be transcribed and may not be corrected upon review.

## 2023-05-06 NOTE — Telephone Encounter (Signed)
 PT not feeling well. I told her to keep her appt if she can.

## 2023-05-08 ENCOUNTER — Other Ambulatory Visit: Payer: Self-pay

## 2023-05-08 ENCOUNTER — Encounter: Payer: Self-pay | Admitting: Internal Medicine

## 2023-05-09 ENCOUNTER — Encounter: Payer: Self-pay | Admitting: Internal Medicine

## 2023-05-10 ENCOUNTER — Other Ambulatory Visit: Payer: Self-pay | Admitting: Internal Medicine

## 2023-05-10 ENCOUNTER — Encounter: Payer: Self-pay | Admitting: Internal Medicine

## 2023-05-10 DIAGNOSIS — C9000 Multiple myeloma not having achieved remission: Secondary | ICD-10-CM

## 2023-05-13 ENCOUNTER — Encounter: Payer: Self-pay | Admitting: Physician Assistant

## 2023-05-13 ENCOUNTER — Encounter: Payer: Self-pay | Admitting: Internal Medicine

## 2023-05-14 ENCOUNTER — Other Ambulatory Visit: Payer: Self-pay | Admitting: Physician Assistant

## 2023-05-14 ENCOUNTER — Encounter: Payer: Self-pay | Admitting: Internal Medicine

## 2023-05-15 ENCOUNTER — Other Ambulatory Visit: Payer: Self-pay | Admitting: Medical Oncology

## 2023-05-15 NOTE — Telephone Encounter (Signed)
 Selinexor  refill escribed on 05/13/2023 .

## 2023-05-18 NOTE — Progress Notes (Unsigned)
 [facility] OFFICE PROGRESS NOTE  Brenda Bloch, MD [address] [place] [address]  DIAGNOSIS: Multiple Myeloma, initially diagnosed as MGUS in September 2010, with additional symptoms suggestive of POEMS syndrome.   PRIOR THERAPY: 1) Velcade  1.3 MG/M2 subcutaneously with Decadron  40 mg by mouth on a weekly basis. First cycle 11/24/2013. She status post 31 weekly doses of treatment. 2) Velcade  1.3 MG/M2 subcutaneously and weekly basis with Decadron  40 mg by mouth weekly. First dose 02/01/2015. Status post 28 cycles. 3) Revlimid  25 mg by mouth daily for 21 days every 4 weeks with weekly Decadron  20 mg. started in 11/27/2015. Status post 3 cycles discontinued secondary to lack of response. 4) Systemic treatment with Velcade  1.3 MG/KG weekly, Revlimid  25 mg by mouth daily for 21 days every 4 weeks in addition to Decadron  20 mg by mouth weekly. First dose 03/06/2016. Status post 42 cycles.  She has a break off treatment from June 2021 until July 2022. 5) Second line treatment with daratumumab , Pomalyst  3 mg for 21 days every 4 weeks as well as Decadron  20 mg p.o. weekly.  First dose 09/26/2022.  Status post 1 cycle and currently receiving day 15 of cycle #2.  CURRENT THERAPY: Third treatment with selinexor , carfilzomib  and dex 28-day cycle. Carfilzomib  56 mg/m2 IV over 30 minutes on days 1, 8 and 15 (20 mg/m2 with cycle 1, day 1). Escalate to 70 mg/m2 with cycle 2 or 3 as tolerated. Selinexor  60 mg PO on days 1, 8, 15 and 22. Escalate to 80 mg weekly as tolerated. Dex 20 mg PO on days 1, 8, 15 and 22. She is status post 3 cycles.   INTERVAL HISTORY: Brenda Conner 63 y.o. female returns to the clinic today for a follow-up visit.  The patient was last seen in the clinic by Dr. Marguerita Shih on 05/06/23.   In summary, when the patient was seen by Dr. Marguerita Shih in early February 2025 she is found to have disease progression of her multiple myeloma and after consultation with Dr.  Pandora Bogaert at [facility]. She recommended treatment with selinexor , carfilzomib  and dex  28-day cycle. She followed by with Dr. Pandora Bogaert on 04/25/23. He recommended PET scan and bone marrow biopsy.   Given her young age, she would benefit from consolidation therapy after her current induction. In that regard, high dose melphalan supported by autologous stem cell transplantation should be considered or BCMA-targeted CAR T cell therapy.   She has been experiencing dose delays with her treatment due to cytopenias.   At her last appointment with Dr. Marguerita Shih she was endorsing sinus issues and chest congestion. She was given a z pack. This is mostly improved.   She has not taken Selinexor  for the past two weeks but continues to take dexamethasone , which alleviates her pain but causes flushing.  Two weeks ago, she experienced an upper respiratory infection, which she attributes to allergies. Symptoms included nasal congestion and chest involvement. She was treated with a Z-Pak, which she believes was beneficial, although she still experiences some nasal congestion. No fever, but she reports episodes of feeling very hot and then very cold over the past few days. Fatigue is present, which she attributes to allergy symptoms. Her cough has decreased significantly since Saturday, and she has not experienced shortness of breath, as she has been resting. She has not had nausea or vomiting recently since she had not been taking Selinexor .   She is here today for evaluation and repeat  blood work before undergoing #4.       MEDICAL HISTORY: Past Medical History:  Diagnosis Date   Acid reflux    Anemia    Anxiety    Asthma    Cancer (HCC)    waldenstroms/ macroglobinulemia   Depression    Depression    Diabetes mellitus without complication (HCC)    Dysuria 02/28/2016   Gallstones    Heart palpitations    Hypercholesteremia    Hypertension    Hypothyroidism    Macroglobulinemia    ? POEMS syndrome    Monoclonal gammopathy of unknown significance (MGUS)    Multiple myeloma (HCC)    Obesity    POEMS syndrome    PONV (postoperative nausea and vomiting)    Sleep apnea    CPAP at bedtime    ALLERGIES:  is allergic to codeine, hydrocodone , lortab [hydrocodone -acetaminophen ], onion, shellfish allergy, and amoxicillin.  MEDICATIONS:  Current Outpatient Medications  Medication Sig Dispense Refill   ACCU-CHEK AVIVA PLUS test strip USE TO CHECK SUGAR TWICE A DAY 90     acyclovir  (ZOVIRAX ) 400 MG tablet Take 1 tablet (400 mg total) by mouth daily. 30 tablet 3   ALPRAZolam  (XANAX ) 0.5 MG tablet Take 1 tablet (0.5 mg total) by mouth at bedtime as needed for anxiety. 30 tablet 1   aspirin 81 MG chewable tablet Chew by mouth daily.     Azelastine  HCl 0.15 % SOLN as needed.     azithromycin  (ZITHROMAX ) 250 MG tablet Use as instructed 6 each 0   B-D UF III MINI PEN NEEDLES 31G X 5 MM MISC      Blood Glucose Monitoring Suppl (ONE TOUCH ULTRA MINI) W/DEVICE KIT See admin instructions. Reported on 01/25/2015  0   cetirizine (ZYRTEC ALLERGY) 10 MG tablet Take 1 tablet by mouth daily.     Continuous Glucose Sensor (FREESTYLE LIBRE 3 SENSOR) MISC USE 1 SENSOR EVERY 14 DAYS     dexamethasone  (DECADRON ) 4 MG tablet Take 5 tablets (20 mg total) by mouth once a week. Take the day after darzalex  faspro. Take with breakfast. 20 tablet 11   dexamethasone  (DECADRON ) 4 MG tablet Take 5 tablets (20mg ) on days 1, 8, 15 and 22 every 28 days 20 tablet 4   DiphenhydrAMINE  HCl (BENADRYL  ALLERGY PO) Take 1 Dose by mouth daily at 6 (six) AM.     fexofenadine (ALLEGRA ALLERGY) 180 MG tablet Take 1 tablet by mouth daily.     Fluticasone-Salmeterol (ADVAIR ) 100-50 MCG/DOSE AEPB Inhale 2 puffs into the lungs every 12 (twelve) hours.     glucosamine-chondroitin 500-400 MG tablet Take 1 tablet by mouth daily.     ibuprofen (ADVIL,MOTRIN) 100 MG tablet Take 100 mg by mouth every 6 (six) hours as needed. Reported on 05/31/2015      Insulin Glargine  (BASAGLAR  KWIKPEN) 100 UNIT/ML Inject 20 Units into the skin at bedtime.     levothyroxine (SYNTHROID ) 137 MCG tablet Take 137 mcg by mouth daily.     lidocaine -prilocaine  (EMLA ) cream Apply 1 Application topically as needed. 30 g 2   lisinopril  (PRINIVIL ,ZESTRIL ) 10 MG tablet Take 10 mg by mouth daily. auth number 05/29/2018 [id number]  3   loperamide (IMODIUM) 2 MG capsule Take 2 mg by mouth as needed for diarrhea or loose stools.     magic mouthwash w/lidocaine  SOLN Take 5 mLs by mouth 4 (four) times daily as needed for mouth pain. Swish, Gargle, and spit 240 mL 1   Melatonin 5 MG CAPS Take  1 capsule by mouth at bedtime.     metFORMIN  (GLUCOPHAGE -XR) 500 MG 24 hr tablet Take 500 mg by mouth daily.     NOVOLOG  FLEXPEN 100 UNIT/ML FlexPen Inject 15 Units into the skin in the morning, at noon, and at bedtime. Per Sliding Scale     OLANZapine  (ZYPREXA ) 5 MG tablet TAKE ONE TABLET THE DAY BEFORE, THE DAY OF, AND THE DAY AFTER SELINEXOR . 90 tablet 1   omeprazole  (PRILOSEC) 20 MG capsule Take 20 mg by mouth 2 (two) times daily.     ondansetron  (ZOFRAN ) 8 MG tablet Take 1 tablet (8 mg total) by mouth every 8 (eight) hours as needed for nausea or vomiting. 90 tablet 1   pravastatin  (PRAVACHOL ) 80 MG tablet Take 80 mg by mouth daily.     PROAIR  HFA 108 (90 Base) MCG/ACT inhaler Reported on 06/21/2015  1   prochlorperazine  (COMPAZINE ) 10 MG tablet Take 1 tablet (10 mg total) by mouth every 6 (six) hours as needed for nausea or vomiting. 30 tablet 1   sertraline  (ZOLOFT ) 50 MG tablet TAKE 3 TABLETS BY MOUTH EVERY DAY 270 tablet 1   verapamil  (CALAN -SR) 240 MG CR tablet Take 240 mg by mouth 2 (two) times daily.     XPOVIO , 60 MG ONCE WEEKLY, Therapy Pack (60 mg once weekly) TAKE 1 TABLET BY MOUTH ONCE WEEKLY 4 tablet 0   No current facility-administered medications for this visit.    SURGICAL HISTORY:  Past Surgical History:  Procedure Laterality Date   ABLATION  09/09/2007   HTA and  polyp resection   BONE MARROW BIOPSY  04/08/2012   BONE MARROW BIOPSY  01/2020   CATARACT EXTRACTION, BILATERAL Bilateral 02/2022   FOOT SURGERY Bilateral 01/09/1996   small toe   IR IMAGING GUIDED PORT INSERTION  03/08/2023   KNEE ARTHROSCOPY W/ MENISCAL REPAIR Bilateral 10/10 ; 3/11   LAPAROSCOPIC CHOLECYSTECTOMY  01/09/1995   LUMBAR DISC SURGERY  03/08/2004   herniation, L4-L5   NASAL SINUS SURGERY  01/08/2002   UTERINE FIBROID EMBOLIZATION  2009   HTA and polyp resection    REVIEW OF SYSTEMS:   Constitutional: Positive for fatigue. Negative for chills, fever and unexpected weight change.  HENT: Negative for mouth sores, nosebleeds, sore throat and trouble swallowing.   Eyes: Negative for eye problems and icterus.  Respiratory: Positive for improved cough. Negative for shortness of breath, hemoptysis, and wheezing.   Cardiovascular: Negative for chest pain and leg swelling.  Gastrointestinal: Negative for vomiting, nausea, abdominal pain, or constipation.  Genitourinary: Negative for bladder incontinence, difficulty urinating, dysuria, frequency and hematuria.   Musculoskeletal: Negative for back pain, gait problem, neck pain and neck stiffness.  Skin: Negative for itching and rash.  Neurological: Negative for dizziness, extremity weakness, gait problem, headaches, light-headedness and seizures.  Hematological: Negative for adenopathy. Does not bruise/bleed easily.  Psychiatric/Behavioral: Negative for confusion, depression and sleep disturbance. The patient is not nervous/anxious.     PHYSICAL EXAMINATION:  There were no vitals taken for this visit.  ECOG PERFORMANCE STATUS: 1  Physical Exam  Constitutional: Oriented to person, place, and time and well-developed, well-nourished, and in no distress.  HENT:  Head: Normocephalic and atraumatic.  Mouth/Throat: Oropharynx is clear and moist. No oropharyngeal exudate.  Eyes: Conjunctivae are normal. Right eye exhibits no  discharge. Left eye exhibits no discharge. No scleral icterus.  Neck: Normal range of motion. Neck supple.  Cardiovascular: Normal rate, regular rhythm, normal heart sounds and intact distal pulses.  Pulmonary/Chest: Effort normal and breath sounds normal. No respiratory distress. No wheezes. No rales.  Abdominal: Soft. Bowel sounds are normal. Exhibits no distension and no mass. There is no tenderness.  Musculoskeletal: Normal range of motion. Exhibits no edema.  Lymphadenopathy:    No cervical adenopathy.  Neurological: Alert and oriented to person, place, and time. Exhibits normal muscle tone. Gait normal. Coordination normal.  Skin: Skin is warm and dry. No rash noted. Not diaphoretic. No erythema. No pallor.  Psychiatric: Mood, memory and judgment normal.  Vitals reviewed.  LABORATORY DATA: Lab Results  Component Value Date   WBC 2.9 (L) 05/06/2023   HGB 8.9 (L) 05/06/2023   HCT 26.8 (L) 05/06/2023   MCV 87.0 05/06/2023   PLT 91 (L) 05/06/2023      Chemistry      Component Value Date/Time   NA 137 05/06/2023 0942   NA 138 11/14/2016 0824   K 4.0 05/06/2023 0942   K 4.3 11/14/2016 0824   CL 105 05/06/2023 0942   CL 104 04/07/2012 1415   CO2 25 05/06/2023 0942   CO2 23 11/14/2016 0824   BUN 6 (L) 05/06/2023 0942   BUN 9.1 11/14/2016 0824   CREATININE 0.69 05/06/2023 0942   CREATININE 0.7 11/14/2016 0824      Component Value Date/Time   CALCIUM 8.6 (L) 05/06/2023 0942   CALCIUM 9.6 11/14/2016 0824   ALKPHOS 143 (H) 05/06/2023 0942   ALKPHOS 94 11/14/2016 0824   AST 40 05/06/2023 0942   AST 28 11/14/2016 0824   ALT 48 (H) 05/06/2023 0942   ALT 24 11/14/2016 0824   BILITOT 0.7 05/06/2023 0942   BILITOT 0.41 11/14/2016 0824       RADIOGRAPHIC STUDIES:  No results found.   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 Her myeloma panel showed continuous increase in  her free lambda light chain.    The patient previously underwent 107 cycles of subcutaneous weekly Velcade , as well as Revlimid  and Decadron  on and off over the last several years.   She had been off treatment since June 2021 until July 2022 in which she had evidence of disease progression.  Therefore, she was restarted on Velcade , Revlimid , and Decadron .  She has restarted treatment in July 2022 with cycle 107 of weekly velcade . She tolerated well without any concerning adverse side effects.  She was then given a break.   She was seen recently by Dr. Pandora Bogaert at [facility] for second opinion and after extensive investigation is recommended for her to resume her treatment for the multiple myeloma with daratumumab , Pomalyst  3 Mg p.o. daily for 21 days every 4 weeks and Decadron  20 mg weekly. This was started in September 2024. This was discontinued due to disease progression in December 2024.    She saw multiple myeloma expert, Dr. Dimitri France recently who recommended Selinexor  (oral), carfilzomib  (infusion), and Decadron  was recommended. She started this on 02/25/23.    She saw Dr. Herchel Loma on 04/25/2023.  She has been experiencing dose delays due to thrombocytopenia.   I checked the doses of her medications with Dr. Marguerita Shih who recommends continuing her treatment at the same dose.   Her labs today show thrombocytopenia with platelet count of 83k. Dr. Marguerita Shih recommends continuing with treatment today as scheduled with close monitoring. We will check her labs next week. We may skip treatments if she cytopenias.    Anemia Chronic anemia with hemoglobin at  8.1 g/dL, contributing to increased fatigue. Hemoglobin near transfusion threshold. - Monitor hemoglobin levels closely. - Prepare for potential blood transfusion if hemoglobin drops into the 7s. - Draw extra tube of blood weekly for monitoring.   Fatigue Increased fatigue, likely multifactorial due to anemia and recent upper  respiratory infection. Selinexor  not taken for two weeks. - Resume Selinexor  as per schedule. - Monitor fatigue levels and adjust treatment as necessary.  Bone marrow biopsy and PET scan Bone marrow biopsy and PET scan discussed for further evaluation. Prefers procedure at current facility due to better sedation options. - Ordered bone marrow biopsy and PET scan. - Schedule bone marrow biopsy with interventional radiology, ensuring light sedation is used. - Schedule PET scan within the next 10-14 days.   She will continue taking her iron supplement.   We will see her back in before undergoing treatment with day 15 cycle #4.   The patient was advised to call immediately if she has any concerning symptoms in the interval. The patient voices understanding of current disease status and treatment options and is in agreement with the current care plan. All questions were answered. The patient knows to call the clinic with any problems, questions or concerns. We can certainly see the patient much sooner if necessary    No orders of the defined types were placed in this encounter.    The total time spent in the appointment was 30-39 minutes  Garyn Waguespack L Bradd Merlos, PA-C 05/18/23

## 2023-05-20 ENCOUNTER — Inpatient Hospital Stay: Attending: Internal Medicine

## 2023-05-20 ENCOUNTER — Inpatient Hospital Stay: Attending: Internal Medicine | Admitting: Physician Assistant

## 2023-05-20 ENCOUNTER — Inpatient Hospital Stay

## 2023-05-20 VITALS — BP 156/66 | HR 81 | Temp 98.0°F | Resp 16 | Wt 263.1 lb

## 2023-05-20 DIAGNOSIS — R110 Nausea: Secondary | ICD-10-CM | POA: Diagnosis not present

## 2023-05-20 DIAGNOSIS — R232 Flushing: Secondary | ICD-10-CM | POA: Insufficient documentation

## 2023-05-20 DIAGNOSIS — Z79624 Long term (current) use of inhibitors of nucleotide synthesis: Secondary | ICD-10-CM | POA: Insufficient documentation

## 2023-05-20 DIAGNOSIS — R5383 Other fatigue: Secondary | ICD-10-CM | POA: Insufficient documentation

## 2023-05-20 DIAGNOSIS — R0989 Other specified symptoms and signs involving the circulatory and respiratory systems: Secondary | ICD-10-CM | POA: Insufficient documentation

## 2023-05-20 DIAGNOSIS — Z7952 Long term (current) use of systemic steroids: Secondary | ICD-10-CM | POA: Insufficient documentation

## 2023-05-20 DIAGNOSIS — T451X5A Adverse effect of antineoplastic and immunosuppressive drugs, initial encounter: Secondary | ICD-10-CM | POA: Insufficient documentation

## 2023-05-20 DIAGNOSIS — C9000 Multiple myeloma not having achieved remission: Secondary | ICD-10-CM | POA: Insufficient documentation

## 2023-05-20 DIAGNOSIS — Z79899 Other long term (current) drug therapy: Secondary | ICD-10-CM | POA: Insufficient documentation

## 2023-05-20 DIAGNOSIS — D649 Anemia, unspecified: Secondary | ICD-10-CM | POA: Insufficient documentation

## 2023-05-20 DIAGNOSIS — Z885 Allergy status to narcotic agent status: Secondary | ICD-10-CM | POA: Diagnosis not present

## 2023-05-20 DIAGNOSIS — D696 Thrombocytopenia, unspecified: Secondary | ICD-10-CM | POA: Insufficient documentation

## 2023-05-20 DIAGNOSIS — Z9049 Acquired absence of other specified parts of digestive tract: Secondary | ICD-10-CM | POA: Insufficient documentation

## 2023-05-20 DIAGNOSIS — D701 Agranulocytosis secondary to cancer chemotherapy: Secondary | ICD-10-CM | POA: Insufficient documentation

## 2023-05-20 DIAGNOSIS — Z7982 Long term (current) use of aspirin: Secondary | ICD-10-CM | POA: Diagnosis not present

## 2023-05-20 DIAGNOSIS — Z880 Allergy status to penicillin: Secondary | ICD-10-CM | POA: Diagnosis not present

## 2023-05-20 DIAGNOSIS — R197 Diarrhea, unspecified: Secondary | ICD-10-CM | POA: Insufficient documentation

## 2023-05-20 DIAGNOSIS — Z5112 Encounter for antineoplastic immunotherapy: Secondary | ICD-10-CM | POA: Insufficient documentation

## 2023-05-20 DIAGNOSIS — Z95828 Presence of other vascular implants and grafts: Secondary | ICD-10-CM

## 2023-05-20 LAB — CBC WITH DIFFERENTIAL (CANCER CENTER ONLY)
Abs Immature Granulocytes: 0.02 10*3/uL (ref 0.00–0.07)
Basophils Absolute: 0 10*3/uL (ref 0.0–0.1)
Basophils Relative: 1 %
Eosinophils Absolute: 0 10*3/uL (ref 0.0–0.5)
Eosinophils Relative: 1 %
HCT: 25.4 % — ABNORMAL LOW (ref 36.0–46.0)
Hemoglobin: 8.1 g/dL — ABNORMAL LOW (ref 12.0–15.0)
Immature Granulocytes: 1 %
Lymphocytes Relative: 18 %
Lymphs Abs: 0.5 10*3/uL — ABNORMAL LOW (ref 0.7–4.0)
MCH: 28.5 pg (ref 26.0–34.0)
MCHC: 31.9 g/dL (ref 30.0–36.0)
MCV: 89.4 fL (ref 80.0–100.0)
Monocytes Absolute: 0.3 10*3/uL (ref 0.1–1.0)
Monocytes Relative: 9 %
Neutro Abs: 2 10*3/uL (ref 1.7–7.7)
Neutrophils Relative %: 70 %
Platelet Count: 83 10*3/uL — ABNORMAL LOW (ref 150–400)
RBC: 2.84 MIL/uL — ABNORMAL LOW (ref 3.87–5.11)
RDW: 17.5 % — ABNORMAL HIGH (ref 11.5–15.5)
WBC Count: 2.9 10*3/uL — ABNORMAL LOW (ref 4.0–10.5)
nRBC: 0 % (ref 0.0–0.2)

## 2023-05-20 LAB — CMP (CANCER CENTER ONLY)
ALT: 19 U/L (ref 0–44)
AST: 27 U/L (ref 15–41)
Albumin: 4.2 g/dL (ref 3.5–5.0)
Alkaline Phosphatase: 126 U/L (ref 38–126)
Anion gap: 6 (ref 5–15)
BUN: 7 mg/dL — ABNORMAL LOW (ref 8–23)
CO2: 26 mmol/L (ref 22–32)
Calcium: 9 mg/dL (ref 8.9–10.3)
Chloride: 107 mmol/L (ref 98–111)
Creatinine: 0.7 mg/dL (ref 0.44–1.00)
GFR, Estimated: >60 mL/min (ref >60)
Glucose, Bld: 151 mg/dL — ABNORMAL HIGH (ref 70–99)
Potassium: 3.7 mmol/L (ref 3.5–5.1)
Sodium: 139 mmol/L (ref 135–145)
Total Bilirubin: 0.6 mg/dL (ref 0.0–1.2)
Total Protein: 6.4 g/dL — ABNORMAL LOW (ref 6.5–8.1)

## 2023-05-20 LAB — SAMPLE TO BLOOD BANK

## 2023-05-20 MED ORDER — SODIUM CHLORIDE 0.9% FLUSH
10.0000 mL | INTRAVENOUS | Status: DC | PRN
  Administered 2023-05-20: 10 mL via INTRAVENOUS

## 2023-05-20 MED ORDER — SODIUM CHLORIDE 0.9 % IV SOLN: Freq: Once | INTRAVENOUS | Status: AC

## 2023-05-20 MED ORDER — DEXTROSE 5 % IV SOLN
56.0000 mg/m2 | Freq: Once | INTRAVENOUS | Status: AC
  Administered 2023-05-20: 120 mg via INTRAVENOUS
  Filled 2023-05-20: qty 60

## 2023-05-20 NOTE — Progress Notes (Signed)
 Per [name] Heilingoetter, PA, ok to treat with low platelets

## 2023-05-22 ENCOUNTER — Ambulatory Visit (HOSPITAL_BASED_OUTPATIENT_CLINIC_OR_DEPARTMENT_OTHER): Payer: 59

## 2023-05-22 ENCOUNTER — Ambulatory Visit (HOSPITAL_BASED_OUTPATIENT_CLINIC_OR_DEPARTMENT_OTHER): Payer: 59 | Admitting: Obstetrics & Gynecology

## 2023-05-22 VITALS — BP 145/53 | HR 72 | Ht 67.0 in | Wt 262.4 lb

## 2023-05-22 DIAGNOSIS — N83201 Unspecified ovarian cyst, right side: Secondary | ICD-10-CM | POA: Diagnosis not present

## 2023-05-22 DIAGNOSIS — D270 Benign neoplasm of right ovary: Secondary | ICD-10-CM

## 2023-05-22 DIAGNOSIS — D4959 Neoplasm of unspecified behavior of other genitourinary organ: Secondary | ICD-10-CM

## 2023-05-25 ENCOUNTER — Encounter (HOSPITAL_BASED_OUTPATIENT_CLINIC_OR_DEPARTMENT_OTHER): Payer: Self-pay | Admitting: Obstetrics & Gynecology

## 2023-05-25 NOTE — Progress Notes (Signed)
 GYNECOLOGY  VISIT  CC:   Discuss ultrasound results, h/o right ovarian cyst  HPI: 63 y.o. G0P0000 Single White or Caucasian female here for discussion of ultrasound results.  Ultrasound obtained due to history of right ovarian cyst.  This was imaged back in November 2024.  The overall volume of the ovarian cyst was about 45 mL.  Today the entire uterus was not fully visualized, specifically the cervix.  Transabdominal imaging was done only.  Endometrium was about 6 mm.  Patient has not had any recent bleeding.  The right ovarian cyst is 4.2 x 4.2 x 4.9 cm.  The volume is 45 mL.  There is no solid component.  This is stable from November.  There is no free fluid in the pelvis.     Past Medical History:  Diagnosis Date   Acid reflux    Anemia    Anxiety    Asthma    Cancer (HCC)    waldenstroms/ macroglobinulemia   Depression    Depression    Diabetes mellitus without complication (HCC)    Dysuria 02/28/2016   Gallstones    Heart palpitations    Hypercholesteremia    Hypertension    Hypothyroidism    Macroglobulinemia    ? POEMS syndrome   Monoclonal gammopathy of unknown significance (MGUS)    Multiple myeloma (HCC)    Obesity    POEMS syndrome    PONV (postoperative nausea and vomiting)    Sleep apnea    CPAP at bedtime    MEDS:   Current Outpatient Medications on File Prior to Visit  Medication Sig Dispense Refill   ACCU-CHEK AVIVA PLUS test strip USE TO CHECK SUGAR TWICE A DAY 90     acyclovir  (ZOVIRAX ) 400 MG tablet Take 1 tablet (400 mg total) by mouth daily. 30 tablet 3   ALPRAZolam  (XANAX ) 0.5 MG tablet Take 1 tablet (0.5 mg total) by mouth at bedtime as needed for anxiety. 30 tablet 1   aspirin 81 MG chewable tablet Chew by mouth daily.     Azelastine  HCl 0.15 % SOLN as needed.     azithromycin  (ZITHROMAX ) 250 MG tablet Use as instructed 6 each 0   B-D UF III MINI PEN NEEDLES 31G X 5 MM MISC      Blood Glucose Monitoring Suppl (ONE TOUCH ULTRA MINI) W/DEVICE KIT  See admin instructions. Reported on 01/25/2015  0   cetirizine (ZYRTEC ALLERGY) 10 MG tablet Take 1 tablet by mouth daily.     Continuous Glucose Sensor (FREESTYLE LIBRE 3 SENSOR) MISC USE 1 SENSOR EVERY 14 DAYS     dexamethasone  (DECADRON ) 4 MG tablet Take 5 tablets (20 mg total) by mouth once a week. Take the day after darzalex  faspro. Take with breakfast. 20 tablet 11   dexamethasone  (DECADRON ) 4 MG tablet Take 5 tablets (20mg ) on days 1, 8, 15 and 22 every 28 days 20 tablet 4   DiphenhydrAMINE  HCl (BENADRYL  ALLERGY PO) Take 1 Dose by mouth daily at 6 (six) AM.     fexofenadine (ALLEGRA ALLERGY) 180 MG tablet Take 1 tablet by mouth daily.     Fluticasone-Salmeterol (ADVAIR ) 100-50 MCG/DOSE AEPB Inhale 2 puffs into the lungs every 12 (twelve) hours.     glucosamine-chondroitin 500-400 MG tablet Take 1 tablet by mouth daily.     ibuprofen (ADVIL,MOTRIN) 100 MG tablet Take 100 mg by mouth every 6 (six) hours as needed. Reported on 05/31/2015     Insulin Glargine  (BASAGLAR  KWIKPEN) 100 UNIT/ML Inject  20 Units into the skin at bedtime.     levothyroxine (SYNTHROID ) 137 MCG tablet Take 137 mcg by mouth daily.     lidocaine -prilocaine  (EMLA ) cream Apply 1 Application topically as needed. 30 g 2   lisinopril  (PRINIVIL ,ZESTRIL ) 10 MG tablet Take 10 mg by mouth daily. auth number 05/29/2018 [id number]  3   loperamide (IMODIUM) 2 MG capsule Take 2 mg by mouth as needed for diarrhea or loose stools.     magic mouthwash w/lidocaine  SOLN Take 5 mLs by mouth 4 (four) times daily as needed for mouth pain. Swish, Gargle, and spit 240 mL 1   Melatonin 5 MG CAPS Take 1 capsule by mouth at bedtime.     metFORMIN  (GLUCOPHAGE -XR) 500 MG 24 hr tablet Take 500 mg by mouth daily.     NOVOLOG  FLEXPEN 100 UNIT/ML FlexPen Inject 15 Units into the skin in the morning, at noon, and at bedtime. Per Sliding Scale     OLANZapine  (ZYPREXA ) 5 MG tablet TAKE ONE TABLET THE DAY BEFORE, THE DAY OF, AND THE DAY AFTER SELINEXOR . 90 tablet  1   omeprazole  (PRILOSEC) 20 MG capsule Take 20 mg by mouth 2 (two) times daily.     ondansetron  (ZOFRAN ) 8 MG tablet Take 1 tablet (8 mg total) by mouth every 8 (eight) hours as needed for nausea or vomiting. 90 tablet 1   pravastatin  (PRAVACHOL ) 80 MG tablet Take 80 mg by mouth daily.     PROAIR  HFA 108 (90 Base) MCG/ACT inhaler Reported on 06/21/2015  1   prochlorperazine  (COMPAZINE ) 10 MG tablet Take 1 tablet (10 mg total) by mouth every 6 (six) hours as needed for nausea or vomiting. 30 tablet 1   sertraline  (ZOLOFT ) 50 MG tablet TAKE 3 TABLETS BY MOUTH EVERY DAY 270 tablet 1   verapamil  (CALAN -SR) 240 MG CR tablet Take 240 mg by mouth 2 (two) times daily.     XPOVIO , 60 MG ONCE WEEKLY, Therapy Pack (60 mg once weekly) TAKE 1 TABLET BY MOUTH ONCE WEEKLY 4 tablet 0   No current facility-administered medications on file prior to visit.    ALLERGIES: Codeine, Hydrocodone , Lortab [hydrocodone -acetaminophen ], Onion, Shellfish allergy, and Amoxicillin  SH: Single, non-smoker  Review of Systems  Constitutional: Negative.   Genitourinary: Negative.     PHYSICAL EXAMINATION:    BP (!) 145/53 (BP Location: Right Arm, Patient Position: Sitting, Cuff Size: Normal)   Pulse 72   Ht 5\' 7"  (1.702 m)   Wt 262 lb 6.4 oz (119 kg)   BMI 41.10 kg/m      Physical Exam Constitutional:      Appearance: Normal appearance.  Neurological:     General: No focal deficit present.     Mental Status: She is alert.  Psychiatric:        Mood and Affect: Mood normal.    Assessment/Plan: 1. Ovarian benign neoplasm, right (Primary) - Right ovarian cyst is completely stable with findings from November 2024.  Will plan to watch this again in 6 months time.  After that we will decide whether to continue with more frequent imaging or to decrease the frequency.  Patient comfortable with this plan.

## 2023-05-27 ENCOUNTER — Inpatient Hospital Stay: Payer: 59

## 2023-05-27 ENCOUNTER — Other Ambulatory Visit: Payer: Self-pay

## 2023-05-27 ENCOUNTER — Other Ambulatory Visit: Payer: Self-pay | Admitting: Medical Oncology

## 2023-05-27 DIAGNOSIS — D649 Anemia, unspecified: Secondary | ICD-10-CM

## 2023-05-27 DIAGNOSIS — C9000 Multiple myeloma not having achieved remission: Secondary | ICD-10-CM

## 2023-05-27 DIAGNOSIS — Z5112 Encounter for antineoplastic immunotherapy: Secondary | ICD-10-CM | POA: Diagnosis not present

## 2023-05-27 DIAGNOSIS — Z95828 Presence of other vascular implants and grafts: Secondary | ICD-10-CM

## 2023-05-27 DIAGNOSIS — D701 Agranulocytosis secondary to cancer chemotherapy: Secondary | ICD-10-CM

## 2023-05-27 LAB — CBC WITH DIFFERENTIAL (CANCER CENTER ONLY)
Abs Immature Granulocytes: 0 10*3/uL (ref 0.00–0.07)
Basophils Absolute: 0 10*3/uL (ref 0.0–0.1)
Basophils Relative: 0 %
Eosinophils Absolute: 0 10*3/uL (ref 0.0–0.5)
Eosinophils Relative: 1 %
HCT: 24.6 % — ABNORMAL LOW (ref 36.0–46.0)
Hemoglobin: 7.9 g/dL — ABNORMAL LOW (ref 12.0–15.0)
Immature Granulocytes: 0 %
Lymphocytes Relative: 25 %
Lymphs Abs: 0.6 10*3/uL — ABNORMAL LOW (ref 0.7–4.0)
MCH: 28.9 pg (ref 26.0–34.0)
MCHC: 32.1 g/dL (ref 30.0–36.0)
MCV: 90.1 fL (ref 80.0–100.0)
Monocytes Absolute: 0.3 10*3/uL (ref 0.1–1.0)
Monocytes Relative: 11 %
Neutro Abs: 1.5 10*3/uL — ABNORMAL LOW (ref 1.7–7.7)
Neutrophils Relative %: 63 %
Platelet Count: 70 10*3/uL — ABNORMAL LOW (ref 150–400)
RBC: 2.73 MIL/uL — ABNORMAL LOW (ref 3.87–5.11)
RDW: 16.8 % — ABNORMAL HIGH (ref 11.5–15.5)
WBC Count: 2.4 10*3/uL — ABNORMAL LOW (ref 4.0–10.5)
nRBC: 0 % (ref 0.0–0.2)

## 2023-05-27 LAB — CMP (CANCER CENTER ONLY)
ALT: 16 U/L (ref 0–44)
AST: 21 U/L (ref 15–41)
Albumin: 4.1 g/dL (ref 3.5–5.0)
Alkaline Phosphatase: 104 U/L (ref 38–126)
Anion gap: 7 (ref 5–15)
BUN: 9 mg/dL (ref 8–23)
CO2: 26 mmol/L (ref 22–32)
Calcium: 8.7 mg/dL — ABNORMAL LOW (ref 8.9–10.3)
Chloride: 106 mmol/L (ref 98–111)
Creatinine: 0.74 mg/dL (ref 0.44–1.00)
GFR, Estimated: >60 mL/min (ref >60)
Glucose, Bld: 138 mg/dL — ABNORMAL HIGH (ref 70–99)
Potassium: 4.2 mmol/L (ref 3.5–5.1)
Sodium: 139 mmol/L (ref 135–145)
Total Bilirubin: 0.7 mg/dL (ref 0.0–1.2)
Total Protein: 6.1 g/dL — ABNORMAL LOW (ref 6.5–8.1)

## 2023-05-27 LAB — PREPARE RBC (CROSSMATCH)

## 2023-05-27 LAB — SAMPLE TO BLOOD BANK

## 2023-05-27 MED ORDER — SODIUM CHLORIDE 0.9% FLUSH
10.0000 mL | INTRAVENOUS | Status: DC | PRN
  Administered 2023-05-27: 10 mL via INTRAVENOUS

## 2023-05-27 MED ORDER — SODIUM CHLORIDE 0.9% IV SOLUTION
250.0000 mL | INTRAVENOUS | Status: DC
  Administered 2023-05-27: 500 mL via INTRAVENOUS

## 2023-05-27 MED ORDER — SODIUM CHLORIDE 0.9% FLUSH
10.0000 mL | INTRAVENOUS | Status: AC | PRN
Start: 2023-05-27 — End: 2023-05-27
  Administered 2023-05-27: 10 mL

## 2023-05-27 MED ORDER — ACETAMINOPHEN 325 MG PO TABS
650.0000 mg | ORAL_TABLET | Freq: Once | ORAL | Status: AC
  Administered 2023-05-27: 650 mg via ORAL
  Filled 2023-05-27: qty 2

## 2023-05-27 MED ORDER — HEPARIN SOD (PORK) LOCK FLUSH 100 UNIT/ML IV SOLN
500.0000 [IU] | Freq: Every day | INTRAVENOUS | Status: AC | PRN
  Administered 2023-05-27: 500 [IU]

## 2023-05-27 MED ORDER — DIPHENHYDRAMINE HCL 25 MG PO CAPS
25.0000 mg | ORAL_CAPSULE | Freq: Once | ORAL | Status: AC
Start: 2023-05-27 — End: 2023-05-27
  Administered 2023-05-27: 25 mg via ORAL
  Filled 2023-05-27: qty 1

## 2023-05-27 NOTE — Patient Instructions (Signed)
 Blood Transfusion, Adult, Care After After a blood transfusion, it is common to have: Bruising and soreness at the IV site. A headache. Follow these instructions at home: Your doctor may give you more instructions. If you have problems, contact your doctor. Insertion site care     Follow instructions from your doctor about how to take care of your insertion site. This is where an IV tube was put into your vein. Make sure you: Wash your hands with soap and water for at least 20 seconds before and after you change your bandage. If you cannot use soap and water, use hand sanitizer. Change your bandage as told by your doctor. Check your insertion site every day for signs of infection. Check for: Redness, swelling, or pain. Bleeding from the site. Warmth. Pus or a bad smell. General instructions Take over-the-counter and prescription medicines only as told by your doctor. Rest as told by your doctor. Go back to your normal activities as told by your doctor. Keep all follow-up visits. You may need to have tests at certain times to check your blood. Contact a doctor if: You have itching or red, swollen areas of skin (hives). You have a fever or chills. You have pain in the head, back, or chest. You feel worried or nervous (anxious). You feel weak after doing your normal activities. You have any of these problems at the insertion site: Redness, swelling, warmth, or pain. Bleeding that does not stop with pressure. Pus or a bad smell. If you received your blood transfusion in an outpatient setting, you will be told whom to contact to report any reactions. Get help right away if: You have signs of a serious reaction. This may be coming from an allergy or the body's defense system (immune system). Signs include: Trouble breathing or shortness of breath. Swelling of the face or feeling warm (flushed). A widespread rash. Dark pee (urine) or blood in the pee. Fast heartbeat. These symptoms  may be an emergency. Get help right away. Call 911. Do not wait to see if the symptoms will go away. Do not drive yourself to the hospital. Summary Bruising and soreness at the IV site are common. Check your insertion site every day for signs of infection. Rest as told by your doctor. Go back to your normal activities as told by your doctor. Get help right away if you have signs of a serious reaction. This information is not intended to replace advice given to you by your health care provider. Make sure you discuss any questions you have with your health care provider.  Thrombocytopenia Thrombocytopenia means that you have a low number of platelets in your blood. Platelets are tiny cells in the blood. When you bleed, they clump together at the cut or injury to stop the bleeding. This is called blood clotting. If you do not have enough platelets, your blood may have trouble clotting. This may cause you to bleed and bruise very easily. What are the causes? This condition is caused by a low number of platelets in your blood. There are three main reasons for this: Your body not making enough platelets. This may be caused by: Bone marrow diseases. Disorders that are passed from parent to child (inherited). Certain cancer medicines or treatments. Infection from germs (bacteria or viruses). Alcoholism. Platelets not being released in the blood. This can be caused by: Having a spleen that is larger than normal. A condition called Gaucher disease. Your body destroying platelets too quickly. This may be  caused by: Certain autoimmune diseases. Some medicines that thin your blood. Certain blood clotting disorders. Certain bleeding disorders. Exposure to harmful (toxic) chemicals. Pregnancy. What are the signs or symptoms? Bruising easily. Bleeding from the nose or mouth. Heavy menstrual periods. Blood in the pee (urine), poop (stool), or vomit. A purple-red color to the skin (purpura). A  rash that looks like pinpoint, purple-red spots (petechiae) on the lower legs. How is this treated? Treatment depends on the cause. Treatment may include: Treatment of another condition that is causing the low platelet count. Medicines to help protect your platelets from being destroyed. A replacement (transfusion) of platelets to stop or prevent bleeding. Surgery to take out the spleen. Follow these instructions at home: Medicines Take over-the-counter and prescription medicines only as told by your doctor. Do not take any medicines that have aspirin or NSAIDs, such as ibuprofen. Activity Avoid doing things that could hurt or bruise you. Take action to prevent falls. Do not play contact sports. Ask your doctor what activities are safe for you. Take care not to burn yourself: When you use an iron. When you cook. Take care not to cut yourself: When you shave. When you use scissors, needles, knives, or other tools. General instructions  Check your skin and the inside of your mouth for bruises or blood as told by your doctor. Wear a medical alert bracelet that says that you have a bleeding disorder. Check to see if there is blood in your pee and poop. Do this as told by your doctor. Do not drink alcohol. If you do drink, limit the amount that you drink. Stay away from harmful (toxic) chemicals. Tell all of your doctors that you have this condition. Be sure to tell your dentist and eye doctor. Tell your dentist about your condition before you have your teeth cleaned. Keep all follow-up visits. Contact a doctor if: You have bruises and you do not know why. You have new symptoms. You have symptoms that get worse. You have a fever. Get help right away if: You have very bad bleeding anywhere on your body. You have blood in your vomit, pee, or poop. You have an injury to your head. You have a sudden, very bad headache. Summary Thrombocytopenia means that you have a low number of  platelets in your blood. Platelets stick together to form a clot. Symptoms of this condition include getting bruises easily, bleeding from the mouth and nose, a purple-red color to the skin, and a rash. Take care not to cut or burn yourself. This information is not intended to replace advice given to you by your health care provider. Make sure you discuss any questions you have with your health care provider. Document Revised: 06/09/2020 Document Reviewed: 06/09/2020 Elsevier Patient Education  2024 [employer] Inc. Document Revised: 03/24/2021 Document Reviewed: 03/24/2021 Elsevier Patient Education  2024 [employer].

## 2023-05-28 LAB — TYPE AND SCREEN
ABO/RH(D): B NEG
Antibody Screen: POSITIVE
Unit division: 0

## 2023-05-28 LAB — BPAM RBC
Blood Product Expiration Date: 202506062359
ISSUE DATE / TIME: 202505191322
Unit Type and Rh: 1700

## 2023-06-02 NOTE — Progress Notes (Unsigned)
 [facility] OFFICE PROGRESS NOTE  Brenda Bloch, MD [address] [place] [address]  DIAGNOSIS: Multiple Myeloma, initially diagnosed as MGUS in September 2010, with additional symptoms suggestive of POEMS syndrome.   PRIOR THERAPY: 1) Velcade  1.3 MG/M2 subcutaneously with Decadron  40 mg by mouth on a weekly basis. First cycle 11/24/2013. She status post 31 weekly doses of treatment. 2) Velcade  1.3 MG/M2 subcutaneously and weekly basis with Decadron  40 mg by mouth weekly. First dose 02/01/2015. Status post 28 cycles. 3) Revlimid  25 mg by mouth daily for 21 days every 4 weeks with weekly Decadron  20 mg. started in 11/27/2015. Status post 3 cycles discontinued secondary to lack of response. 4) Systemic treatment with Velcade  1.3 MG/KG weekly, Revlimid  25 mg by mouth daily for 21 days every 4 weeks in addition to Decadron  20 mg by mouth weekly. First dose 03/06/2016. Status post 42 cycles.  She has a break off treatment from June 2021 until July 2022. 5) Second line treatment with daratumumab , Pomalyst  3 mg for 21 days every 4 weeks as well as Decadron  20 mg p.o. weekly.  First dose 09/26/2022.  Status post 1 cycle and currently receiving day 15 of cycle #2.  CURRENT THERAPY: Third treatment with selinexor , carfilzomib  and dex 28-day cycle. Carfilzomib  56 mg/m2 IV over 30 minutes on days 1, 8 and 15 (20 mg/m2 with cycle 1, day 1). Escalate to 70 mg/m2 with cycle 2 or 3 as tolerated. Selinexor  60 mg PO on days 1, 8, 15 and 22. Escalate to 80 mg weekly as tolerated. Dex 20 mg PO on days 1, 8, 15 and 22. She is status post day 1 cycle #4  cycles.   INTERVAL HISTORY: Brenda Conner 63 y.o. female returns to the clinic today for a follow-up visit.  The patient was last seen in the clinic by myself on 05/20/23  In summary, when the patient was seen by Dr. Marguerita Shih in early February 2025 she is found to have disease progression of her multiple myeloma and after consultation with  Dr. Pandora Bogaert at [facility]. She recommended treatment with selinexor , carfilzomib  and dex  28-day cycle. She followed by with Dr. Pandora Bogaert on 04/25/23. He recommended PET scan and bone marrow biopsy.   She is scheduled for a bone marrow biopsy on 6/5 and PET scan has not been scheduled yet.   Given her young age, they would benefit from consolidation therapy after her current induction. In that regard, high dose melphalan supported by autologous stem cell transplantation should be considered or BCMA-targeted CAR T cell therapy.   She does have cytopenias which causes cancellation of treatment.    She had a blood transfusion last week and felt a lot better with regard to her energy.   She is preparing for CAR T therapy, with an upcoming appointment in [place] for blood work and T cell harvesting scheduled for July 2nd and 3rd. She tentatively is set up for this on August 9th.      Overall she is feeling well. She denies fever. She denies respiratory changes. She has nausea but not vomiting. Her nausea is manageable with her anti-emetics. She denies abnormal bleeding, or bruising. No signs of infection, nasal congestion, sore throat, dysuria, or skin infections.  She experiences intermittent diarrhea which is normal for her. She is here today for evaluation and repeat blood work before undergoing #4 day 8.   MEDICAL HISTORY: Past Medical History:  Diagnosis Date   Acid reflux  Anemia    Anxiety    Asthma    Cancer (HCC)    waldenstroms/ macroglobinulemia   Depression    Depression    Diabetes mellitus without complication (HCC)    Dysuria 02/28/2016   Gallstones    Heart palpitations    Hypercholesteremia    Hypertension    Hypothyroidism    Macroglobulinemia    ? POEMS syndrome   Monoclonal gammopathy of unknown significance (MGUS)    Multiple myeloma (HCC)    Obesity    POEMS syndrome    PONV (postoperative nausea and vomiting)    Sleep apnea    CPAP at bedtime     ALLERGIES:  is allergic to codeine, hydrocodone , lortab [hydrocodone -acetaminophen ], onion, shellfish allergy, and amoxicillin.  MEDICATIONS:  Current Outpatient Medications  Medication Sig Dispense Refill   ACCU-CHEK AVIVA PLUS test strip USE TO CHECK SUGAR TWICE A DAY 90     acyclovir  (ZOVIRAX ) 400 MG tablet Take 1 tablet (400 mg total) by mouth daily. 30 tablet 3   ALPRAZolam  (XANAX ) 0.5 MG tablet Take 1 tablet (0.5 mg total) by mouth at bedtime as needed for anxiety. 30 tablet 1   aspirin 81 MG chewable tablet Chew by mouth daily.     Azelastine  HCl 0.15 % SOLN as needed.     azithromycin  (ZITHROMAX ) 250 MG tablet Use as instructed 6 each 0   B-D UF III MINI PEN NEEDLES 31G X 5 MM MISC      Blood Glucose Monitoring Suppl (ONE TOUCH ULTRA MINI) W/DEVICE KIT See admin instructions. Reported on 01/25/2015  0   cetirizine (ZYRTEC ALLERGY) 10 MG tablet Take 1 tablet by mouth daily.     Continuous Glucose Sensor (FREESTYLE LIBRE 3 SENSOR) MISC USE 1 SENSOR EVERY 14 DAYS     dexamethasone  (DECADRON ) 4 MG tablet Take 5 tablets (20 mg total) by mouth once a week. Take the day after darzalex  faspro. Take with breakfast. 20 tablet 11   dexamethasone  (DECADRON ) 4 MG tablet Take 5 tablets (20mg ) on days 1, 8, 15 and 22 every 28 days 20 tablet 4   DiphenhydrAMINE  HCl (BENADRYL  ALLERGY PO) Take 1 Dose by mouth daily at 6 (six) AM.     fexofenadine (ALLEGRA ALLERGY) 180 MG tablet Take 1 tablet by mouth daily.     Fluticasone-Salmeterol (ADVAIR ) 100-50 MCG/DOSE AEPB Inhale 2 puffs into the lungs every 12 (twelve) hours.     glucosamine-chondroitin 500-400 MG tablet Take 1 tablet by mouth daily.     ibuprofen (ADVIL,MOTRIN) 100 MG tablet Take 100 mg by mouth every 6 (six) hours as needed. Reported on 05/31/2015     Insulin Glargine  (BASAGLAR  KWIKPEN) 100 UNIT/ML Inject 20 Units into the skin at bedtime.     levothyroxine (SYNTHROID ) 137 MCG tablet Take 137 mcg by mouth daily.     lidocaine -prilocaine   (EMLA ) cream Apply 1 Application topically as needed. 30 g 2   lisinopril  (PRINIVIL ,ZESTRIL ) 10 MG tablet Take 10 mg by mouth daily. auth number 05/29/2018 [id number]  3   loperamide (IMODIUM) 2 MG capsule Take 2 mg by mouth as needed for diarrhea or loose stools.     magic mouthwash w/lidocaine  SOLN Take 5 mLs by mouth 4 (four) times daily as needed for mouth pain. Swish, Gargle, and spit 240 mL 1   Melatonin 5 MG CAPS Take 1 capsule by mouth at bedtime.     metFORMIN  (GLUCOPHAGE -XR) 500 MG 24 hr tablet Take 500 mg by mouth daily.  NOVOLOG  FLEXPEN 100 UNIT/ML FlexPen Inject 15 Units into the skin in the morning, at noon, and at bedtime. Per Sliding Scale     OLANZapine  (ZYPREXA ) 5 MG tablet TAKE ONE TABLET THE DAY BEFORE, THE DAY OF, AND THE DAY AFTER SELINEXOR . 90 tablet 1   omeprazole  (PRILOSEC) 20 MG capsule Take 20 mg by mouth 2 (two) times daily.     ondansetron  (ZOFRAN ) 8 MG tablet Take 1 tablet (8 mg total) by mouth every 8 (eight) hours as needed for nausea or vomiting. 90 tablet 1   pravastatin  (PRAVACHOL ) 80 MG tablet Take 80 mg by mouth daily.     PROAIR  HFA 108 (90 Base) MCG/ACT inhaler Reported on 06/21/2015  1   prochlorperazine  (COMPAZINE ) 10 MG tablet Take 1 tablet (10 mg total) by mouth every 6 (six) hours as needed for nausea or vomiting. 30 tablet 1   sertraline  (ZOLOFT ) 50 MG tablet TAKE 3 TABLETS BY MOUTH EVERY DAY 270 tablet 1   verapamil  (CALAN -SR) 240 MG CR tablet Take 240 mg by mouth 2 (two) times daily.     XPOVIO , 60 MG ONCE WEEKLY, Therapy Pack (60 mg once weekly) TAKE 1 TABLET BY MOUTH ONCE WEEKLY 4 tablet 0   No current facility-administered medications for this visit.    SURGICAL HISTORY:  Past Surgical History:  Procedure Laterality Date   ABLATION  09/09/2007   HTA and polyp resection   BONE MARROW BIOPSY  04/08/2012   BONE MARROW BIOPSY  01/2020   CATARACT EXTRACTION, BILATERAL Bilateral 02/2022   FOOT SURGERY Bilateral 01/09/1996   small toe   IR  IMAGING GUIDED PORT INSERTION  03/08/2023   KNEE ARTHROSCOPY W/ MENISCAL REPAIR Bilateral 10/10 ; 3/11   LAPAROSCOPIC CHOLECYSTECTOMY  01/09/1995   LUMBAR DISC SURGERY  03/08/2004   herniation, L4-L5   NASAL SINUS SURGERY  01/08/2002   UTERINE FIBROID EMBOLIZATION  2009   HTA and polyp resection    REVIEW OF SYSTEMS:   Constitutional: Positive for improved fatigue. Negative for chills, fever and unexpected weight change.  HENT: Negative for mouth sores, nosebleeds, sore throat and trouble swallowing.   Eyes: Negative for eye problems and icterus.  Respiratory: Negative for shortness of breath, cough, hemoptysis, and wheezing.   Cardiovascular: Negative for chest pain and leg swelling.  Gastrointestinal: Intermittent nausea and intermittent diarrhea. Negative for vomiting, abdominal pain, or constipation.  Genitourinary: Negative for bladder incontinence, difficulty urinating, dysuria, frequency and hematuria.   Musculoskeletal: Negative for back pain, gait problem, neck pain and neck stiffness.  Skin: Negative for itching and rash.  Neurological: Negative for dizziness, extremity weakness, gait problem, headaches, light-headedness and seizures.  Hematological: Negative for adenopathy. Does not bruise/bleed easily.  Psychiatric/Behavioral: Negative for confusion, depression and sleep disturbance. The patient is not nervous/anxious.       PHYSICAL EXAMINATION:  Blood pressure (!) 140/64, pulse 69, temperature 98.1 F (36.7 C), temperature source Temporal, resp. rate 13, weight 258 lb 9.6 oz (117.3 kg), SpO2 100%.  ECOG PERFORMANCE STATUS: 1  Physical Exam  Constitutional: Oriented to person, place, and time and well-developed, well-nourished, and in no distress.  HENT:  Head: Normocephalic and atraumatic.  Mouth/Throat: Oropharynx is clear and moist. No oropharyngeal exudate.  Eyes: Conjunctivae are normal. Right eye exhibits no discharge. Left eye exhibits no discharge. No scleral  icterus.  Neck: Normal range of motion. Neck supple.  Cardiovascular: Normal rate, regular rhythm, normal heart sounds and intact distal pulses.   Pulmonary/Chest: Effort normal and breath  sounds normal. No respiratory distress. No wheezes. No rales.  Abdominal: Soft. Bowel sounds are normal. Exhibits no distension and no mass. There is no tenderness.  Musculoskeletal: Normal range of motion. Exhibits no edema.  Lymphadenopathy:    No cervical adenopathy.  Neurological: Alert and oriented to person, place, and time. Exhibits normal muscle tone. Gait normal. Coordination normal.  Skin: Skin is warm and dry. No rash noted. Not diaphoretic. No erythema. No pallor.  Psychiatric: Mood, memory and judgment normal.  Vitals reviewed.  LABORATORY DATA: Lab Results  Component Value Date   WBC 2.5 (L) 06/04/2023   HGB 8.8 (L) 06/04/2023   HCT 27.7 (L) 06/04/2023   MCV 88.2 06/04/2023   PLT 106 (L) 06/04/2023      Chemistry      Component Value Date/Time   NA 138 06/04/2023 0904   NA 138 11/14/2016 0824   K 4.0 06/04/2023 0904   K 4.3 11/14/2016 0824   CL 107 06/04/2023 0904   CL 104 04/07/2012 1415   CO2 24 06/04/2023 0904   CO2 23 11/14/2016 0824   BUN 8 06/04/2023 0904   BUN 9.1 11/14/2016 0824   CREATININE 0.69 06/04/2023 0904   CREATININE 0.7 11/14/2016 0824      Component Value Date/Time   CALCIUM 8.7 (L) 06/04/2023 0904   CALCIUM 9.6 11/14/2016 0824   ALKPHOS 120 06/04/2023 0904   ALKPHOS 94 11/14/2016 0824   AST 25 06/04/2023 0904   AST 28 11/14/2016 0824   ALT 19 06/04/2023 0904   ALT 24 11/14/2016 0824   BILITOT 0.7 06/04/2023 0904   BILITOT 0.41 11/14/2016 0824       RADIOGRAPHIC STUDIES:  US  PELVIS TRANSVAGINAL NON-OB (TV ONLY) Result Date: 05/25/2023 CLINICAL DATA:  h/o right ovarian cyst  EXAM: TRANSABDOMINAL ULTRASOUND OF PELVIS  TECHNIQUE: Transabdominal technique was performed for global imaging of the pelvis including uterus, ovaries, adnexal regions,  and pelvic cul-de-sac. COMPARISON:  11/23/2022 FINDINGS: Uterus: Complete measurements could not be done today as uterus was seen from the lower uterine segment up to the fundus.  Cervix not well-visualized.  No abnormal findings noted within the uterus.  Endometrial thickness: 6mm, no abnormal blood flow noted.  Right ovary: 5.9 x 5.3 x 0.6 cm.  Volume: 92.89ml  Cystic lesion again noted measuring 4.2 x 4.2 x 4.9 cm.  Cyst is 45 mL.  Cystic lesion is stable. Left ovary: Not well-visualized  Other findings:  No abnormal free fluid.    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 Her myeloma panel showed continuous increase in her free lambda light chain.    The patient previously underwent 107 cycles of subcutaneous weekly Velcade , as well as Revlimid  and Decadron  on and off over the last several years.   She had been off treatment since June 2021 until July 2022 in which she had evidence of disease progression.  Therefore, she was restarted on Velcade , Revlimid , and Decadron .  She has restarted treatment in July 2022 with cycle 107 of weekly velcade . She tolerated well without any concerning adverse side effects.  She was then given a break.   She was seen recently by Dr. Pandora Bogaert at [facility] for second opinion and after extensive investigation is recommended for her to resume her treatment for the multiple myeloma with daratumumab , Pomalyst  3 Mg p.o. daily for 21 days every 4 weeks and Decadron  20 mg weekly. This was started in September  2024. This was discontinued due to disease progression in December 2024.    She saw multiple myeloma expert, Dr. Dimitri France recently who recommended Selinexor  (oral), carfilzomib  (infusion), and Decadron  was recommended. She started this on 02/25/23.    She saw Dr. Herchel Loma on 04/25/2023.   She has been experiencing dose delays due to thrombocytopenia.    Her  labs today show thrombocytopenia with platelet count of 106k and WBC 2.5 and ANC 1.5. Hbg 8.8. I reviewed with Dr. Marguerita Shih. We recommend proceeding with treatment today at the same dose.    Anemia Chronic anemia with hemoglobin at 8.8 g/dL - Monitor hemoglobin levels closely. - Prepare for potential blood transfusion if hemoglobin drops into the 7s. - Draw extra tube of blood weekly for monitoring.     Bone marrow biopsy and PET scan Bone marrow biopsy and PET scan discussed for further evaluation. Prefers procedure at current facility due to better sedation options. - Schedule bone marrow biopsy with interventional radiology, ensuring light sedation is used. Scheduled for 6/5 - Schedule PET scan within the next 10-14 days. Gave patient number to radiology scheduling     She will continue taking her iron supplement.    We will see her back in before undergoing treatment with day 1 cycle #5.   Multiple Myeloma Preparing for CAR T-cell therapy with initial blood work scheduled for insurance purposes. T-cell harvesting planned for July 2nd and 3rd, with the procedure tentatively scheduled for August 9th, pending insurance approval. - Proceed with CAR T-cell therapy preparation. - Inform Dr. Marguerita Shih of treatment progress.  The patient was advised to call immediately if she has any concerning symptoms in the interval. The patient voices understanding of current disease status and treatment options and is in agreement with the current care plan. All questions were answered. The patient knows to call the clinic with any problems, questions or concerns. We can certainly see the patient much sooner if necessary    No orders of the defined types were placed in this encounter.   The total time spent in the appointment was 20-29 minutes  Ramiyah Mcclenahan L Riyaan Heroux, PA-C 06/04/23

## 2023-06-04 ENCOUNTER — Inpatient Hospital Stay: Payer: 59

## 2023-06-04 ENCOUNTER — Inpatient Hospital Stay: Payer: 59 | Admitting: Physician Assistant

## 2023-06-04 VITALS — BP 140/64 | HR 69 | Temp 98.1°F | Resp 13 | Wt 258.6 lb

## 2023-06-04 DIAGNOSIS — C9000 Multiple myeloma not having achieved remission: Secondary | ICD-10-CM

## 2023-06-04 DIAGNOSIS — Z5112 Encounter for antineoplastic immunotherapy: Secondary | ICD-10-CM | POA: Diagnosis not present

## 2023-06-04 DIAGNOSIS — Z5111 Encounter for antineoplastic chemotherapy: Secondary | ICD-10-CM

## 2023-06-04 DIAGNOSIS — Z95828 Presence of other vascular implants and grafts: Secondary | ICD-10-CM

## 2023-06-04 DIAGNOSIS — T451X5A Adverse effect of antineoplastic and immunosuppressive drugs, initial encounter: Secondary | ICD-10-CM

## 2023-06-04 LAB — CMP (CANCER CENTER ONLY)
ALT: 19 U/L (ref 0–44)
AST: 25 U/L (ref 15–41)
Albumin: 4.3 g/dL (ref 3.5–5.0)
Alkaline Phosphatase: 120 U/L (ref 38–126)
Anion gap: 7 (ref 5–15)
BUN: 8 mg/dL (ref 8–23)
CO2: 24 mmol/L (ref 22–32)
Calcium: 8.7 mg/dL — ABNORMAL LOW (ref 8.9–10.3)
Chloride: 107 mmol/L (ref 98–111)
Creatinine: 0.69 mg/dL (ref 0.44–1.00)
GFR, Estimated: >60 mL/min (ref >60)
Glucose, Bld: 131 mg/dL — ABNORMAL HIGH (ref 70–99)
Potassium: 4 mmol/L (ref 3.5–5.1)
Sodium: 138 mmol/L (ref 135–145)
Total Bilirubin: 0.7 mg/dL (ref 0.0–1.2)
Total Protein: 6.4 g/dL — ABNORMAL LOW (ref 6.5–8.1)

## 2023-06-04 LAB — SAMPLE TO BLOOD BANK

## 2023-06-04 LAB — CBC WITH DIFFERENTIAL (CANCER CENTER ONLY)
Abs Immature Granulocytes: 0.01 10*3/uL (ref 0.00–0.07)
Basophils Absolute: 0 10*3/uL (ref 0.0–0.1)
Basophils Relative: 1 %
Eosinophils Absolute: 0 10*3/uL (ref 0.0–0.5)
Eosinophils Relative: 0 %
HCT: 27.7 % — ABNORMAL LOW (ref 36.0–46.0)
Hemoglobin: 8.8 g/dL — ABNORMAL LOW (ref 12.0–15.0)
Immature Granulocytes: 0 %
Lymphocytes Relative: 29 %
Lymphs Abs: 0.7 10*3/uL (ref 0.7–4.0)
MCH: 28 pg (ref 26.0–34.0)
MCHC: 31.8 g/dL (ref 30.0–36.0)
MCV: 88.2 fL (ref 80.0–100.0)
Monocytes Absolute: 0.3 10*3/uL (ref 0.1–1.0)
Monocytes Relative: 11 %
Neutro Abs: 1.5 10*3/uL — ABNORMAL LOW (ref 1.7–7.7)
Neutrophils Relative %: 59 %
Platelet Count: 106 10*3/uL — ABNORMAL LOW (ref 150–400)
RBC: 3.14 MIL/uL — ABNORMAL LOW (ref 3.87–5.11)
RDW: 16.9 % — ABNORMAL HIGH (ref 11.5–15.5)
WBC Count: 2.5 10*3/uL — ABNORMAL LOW (ref 4.0–10.5)
nRBC: 0 % (ref 0.0–0.2)

## 2023-06-04 MED ORDER — HEPARIN SOD (PORK) LOCK FLUSH 100 UNIT/ML IV SOLN
500.0000 [IU] | Freq: Once | INTRAVENOUS | Status: AC | PRN
  Administered 2023-06-04: 500 [IU]

## 2023-06-04 MED ORDER — SODIUM CHLORIDE 0.9 % IV SOLN
INTRAVENOUS | Status: DC
Start: 2023-06-04 — End: 2023-06-04

## 2023-06-04 MED ORDER — SODIUM CHLORIDE 0.9% FLUSH
10.0000 mL | INTRAVENOUS | Status: DC | PRN
  Administered 2023-06-04: 10 mL via INTRAVENOUS

## 2023-06-04 MED ORDER — DEXTROSE 5 % IV SOLN
56.0000 mg/m2 | Freq: Once | INTRAVENOUS | Status: AC
  Administered 2023-06-04: 120 mg via INTRAVENOUS
  Filled 2023-06-04: qty 60

## 2023-06-04 MED ORDER — SODIUM CHLORIDE 0.9 % IV SOLN: Freq: Once | INTRAVENOUS | Status: AC

## 2023-06-04 MED ORDER — SODIUM CHLORIDE 0.9% FLUSH
10.0000 mL | INTRAVENOUS | Status: DC | PRN
Start: 2023-06-04 — End: 2023-06-04
  Administered 2023-06-04: 10 mL

## 2023-06-04 NOTE — Patient Instructions (Signed)

## 2023-06-10 ENCOUNTER — Other Ambulatory Visit: Payer: Self-pay | Admitting: Physician Assistant

## 2023-06-10 ENCOUNTER — Encounter: Payer: Self-pay | Admitting: Internal Medicine

## 2023-06-10 ENCOUNTER — Inpatient Hospital Stay: Attending: Internal Medicine

## 2023-06-10 DIAGNOSIS — Z79899 Other long term (current) drug therapy: Secondary | ICD-10-CM | POA: Diagnosis not present

## 2023-06-10 DIAGNOSIS — Z880 Allergy status to penicillin: Secondary | ICD-10-CM | POA: Diagnosis not present

## 2023-06-10 DIAGNOSIS — Z7982 Long term (current) use of aspirin: Secondary | ICD-10-CM | POA: Diagnosis not present

## 2023-06-10 DIAGNOSIS — Z79890 Hormone replacement therapy (postmenopausal): Secondary | ICD-10-CM | POA: Diagnosis not present

## 2023-06-10 DIAGNOSIS — Z885 Allergy status to narcotic agent status: Secondary | ICD-10-CM | POA: Insufficient documentation

## 2023-06-10 DIAGNOSIS — E039 Hypothyroidism, unspecified: Secondary | ICD-10-CM | POA: Diagnosis not present

## 2023-06-10 DIAGNOSIS — Z95828 Presence of other vascular implants and grafts: Secondary | ICD-10-CM

## 2023-06-10 DIAGNOSIS — Z5112 Encounter for antineoplastic immunotherapy: Secondary | ICD-10-CM | POA: Diagnosis present

## 2023-06-10 DIAGNOSIS — Z9049 Acquired absence of other specified parts of digestive tract: Secondary | ICD-10-CM | POA: Insufficient documentation

## 2023-06-10 DIAGNOSIS — D61818 Other pancytopenia: Secondary | ICD-10-CM | POA: Insufficient documentation

## 2023-06-10 DIAGNOSIS — C9000 Multiple myeloma not having achieved remission: Secondary | ICD-10-CM

## 2023-06-10 DIAGNOSIS — R5383 Other fatigue: Secondary | ICD-10-CM | POA: Insufficient documentation

## 2023-06-10 LAB — CBC WITH DIFFERENTIAL (CANCER CENTER ONLY)
Abs Immature Granulocytes: 0.01 10*3/uL (ref 0.00–0.07)
Basophils Absolute: 0 10*3/uL (ref 0.0–0.1)
Basophils Relative: 0 %
Eosinophils Absolute: 0 10*3/uL (ref 0.0–0.5)
Eosinophils Relative: 0 %
HCT: 26.5 % — ABNORMAL LOW (ref 36.0–46.0)
Hemoglobin: 8.5 g/dL — ABNORMAL LOW (ref 12.0–15.0)
Immature Granulocytes: 1 %
Lymphocytes Relative: 10 %
Lymphs Abs: 0.2 10*3/uL — ABNORMAL LOW (ref 0.7–4.0)
MCH: 28.1 pg (ref 26.0–34.0)
MCHC: 32.1 g/dL (ref 30.0–36.0)
MCV: 87.5 fL (ref 80.0–100.0)
Monocytes Absolute: 0 10*3/uL — ABNORMAL LOW (ref 0.1–1.0)
Monocytes Relative: 1 %
Neutro Abs: 1.7 10*3/uL (ref 1.7–7.7)
Neutrophils Relative %: 88 %
Platelet Count: 57 10*3/uL — ABNORMAL LOW (ref 150–400)
RBC: 3.03 MIL/uL — ABNORMAL LOW (ref 3.87–5.11)
RDW: 16.1 % — ABNORMAL HIGH (ref 11.5–15.5)
WBC Count: 1.9 10*3/uL — ABNORMAL LOW (ref 4.0–10.5)
nRBC: 0 % (ref 0.0–0.2)

## 2023-06-10 LAB — SAMPLE TO BLOOD BANK

## 2023-06-10 MED ORDER — SODIUM CHLORIDE 0.9% FLUSH
10.0000 mL | INTRAVENOUS | Status: DC | PRN
  Administered 2023-06-10: 10 mL via INTRAVENOUS

## 2023-06-10 MED ORDER — HEPARIN SOD (PORK) LOCK FLUSH 100 UNIT/ML IV SOLN
500.0000 [IU] | Freq: Once | INTRAVENOUS | Status: AC
  Administered 2023-06-10: 500 [IU] via INTRAVENOUS

## 2023-06-10 NOTE — Patient Instructions (Signed)

## 2023-06-11 ENCOUNTER — Other Ambulatory Visit: Payer: Self-pay

## 2023-06-12 ENCOUNTER — Other Ambulatory Visit: Payer: Self-pay | Admitting: Radiology

## 2023-06-12 DIAGNOSIS — C9000 Multiple myeloma not having achieved remission: Secondary | ICD-10-CM

## 2023-06-12 NOTE — H&P (Signed)
 Chief Complaint:  Multiple Myeloma, initially diagnosed as MGUS in September 2010, with additional symptoms suggestive of POEMS syndrome ; referred for image guided bone marrow biopsy  Referring Provider(s): Mohamed,M  Supervising Physician: Art Largo  Patient Status: [facility] - Out-pt  History of Present Illness: Brenda Conner is a 63 y.o. 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 Her myeloma panel showed continuous increase in her free lambda light chain. She is scheduled today for image guided bone marrow biopsy to assess status of multiple myeloma to see if canididate to move forward with a transplant.  *** Patient is Full Code  Past Medical History:  Diagnosis Date   Acid reflux    Anemia    Anxiety    Asthma    Cancer (HCC)    waldenstroms/ macroglobinulemia   Depression    Depression    Diabetes mellitus without complication (HCC)    Dysuria 02/28/2016   Gallstones    Heart palpitations    Hypercholesteremia    Hypertension    Hypothyroidism    Macroglobulinemia    ? POEMS syndrome   Monoclonal gammopathy of unknown significance (MGUS)    Multiple myeloma (HCC)    Obesity    POEMS syndrome    PONV (postoperative nausea and vomiting)    Sleep apnea    CPAP at bedtime    Past Surgical History:  Procedure Laterality Date   ABLATION  09/09/2007   HTA and polyp resection   BONE MARROW BIOPSY  04/08/2012   BONE MARROW BIOPSY  01/2020   CATARACT EXTRACTION, BILATERAL Bilateral 02/2022   FOOT SURGERY Bilateral 01/09/1996   small toe   IR IMAGING GUIDED PORT INSERTION  03/08/2023   KNEE ARTHROSCOPY W/ MENISCAL REPAIR Bilateral 10/10 ; 3/11   LAPAROSCOPIC CHOLECYSTECTOMY  01/09/1995   LUMBAR DISC SURGERY  03/08/2004   herniation, L4-L5   NASAL SINUS SURGERY  01/08/2002   UTERINE FIBROID EMBOLIZATION  2009   HTA and polyp  resection    Allergies: Codeine, Hydrocodone , Lortab [hydrocodone -acetaminophen ], Onion, Shellfish allergy, and Amoxicillin  Medications: Prior to Admission medications   Medication Sig Start Date End Date Taking? Authorizing Provider  ACCU-CHEK AVIVA PLUS test strip USE TO CHECK SUGAR TWICE A DAY 90 12/12/18   [provider]  acyclovir  (ZOVIRAX ) 400 MG tablet Take 1 tablet (400 mg total) by mouth daily. 02/20/23   Marlene Simas, MD  ALPRAZolam  (XANAX ) 0.5 MG tablet Take 1 tablet (0.5 mg total) by mouth at bedtime as needed for anxiety. 03/28/23   Lillian Rein, MD  aspirin 81 MG chewable tablet Chew by mouth daily.    [provider]  Azelastine  HCl 0.15 % SOLN as needed. 12/18/13   [provider]  azithromycin  (ZITHROMAX ) 250 MG tablet Use as instructed 05/06/23   Marlene Simas, MD  B-D UF III MINI PEN NEEDLES 31G X 5 MM MISC  02/17/19   [provider]  Blood Glucose Monitoring Suppl (ONE TOUCH ULTRA MINI) W/DEVICE KIT See admin instructions. Reported on 01/25/2015 02/04/14   [provider]  cetirizine (ZYRTEC ALLERGY) 10 MG tablet Take 1 tablet by mouth daily.    [provider]  Continuous Glucose Sensor (FREESTYLE LIBRE 3 SENSOR) MISC USE 1 SENSOR EVERY 14 DAYS 10/15/22   [provider]  dexamethasone  (DECADRON ) 4 MG tablet Take 5 tablets (20 mg total) by mouth once a week.  Take the day after darzalex  faspro. Take with breakfast. 09/19/22   Marlene Simas, MD  dexamethasone  (DECADRON ) 4 MG tablet Take 5 tablets (20mg ) on days 1, 8, 15 and 22 every 28 days 02/20/23   Marlene Simas, MD  DiphenhydrAMINE  HCl (BENADRYL  ALLERGY PO) Take 1 Dose by mouth daily at 6 (six) AM.    [provider]  fexofenadine (ALLEGRA ALLERGY) 180 MG tablet Take 1 tablet by mouth daily.    [provider]  Fluticasone-Salmeterol (ADVAIR ) 100-50 MCG/DOSE AEPB Inhale 2 puffs into the lungs every 12 (twelve) hours.    [provider]  glucosamine-chondroitin 500-400 MG tablet Take 1 tablet by mouth daily.    [provider]  ibuprofen (ADVIL,MOTRIN) 100 MG tablet Take 100 mg by mouth every 6 (six) hours as needed. Reported on 05/31/2015    [provider]  Insulin Glargine  (BASAGLAR  KWIKPEN) 100 UNIT/ML Inject 20 Units into the skin at bedtime. 03/09/19   [provider]  levothyroxine (SYNTHROID ) 137 MCG tablet Take 137 mcg by mouth daily. 08/17/21   [provider]  lidocaine -prilocaine  (EMLA ) cream Apply 1 Application topically as needed. 03/04/23   Heilingoetter, Cassandra L, PA-C  lisinopril  (PRINIVIL ,ZESTRIL ) 10 MG tablet Take 10 mg by mouth daily. auth number 05/29/2018 [id number] 04/23/15   [provider]  loperamide (IMODIUM) 2 MG capsule Take 2 mg by mouth as needed for diarrhea or loose stools.    [provider]  magic mouthwash w/lidocaine  SOLN Take 5 mLs by mouth 4 (four) times daily as needed for mouth pain. Swish, Gargle, and spit 01/28/18   Tanner, Van E., PA-C  Melatonin 5 MG CAPS Take 1 capsule by mouth at bedtime.    [provider]  metFORMIN  (GLUCOPHAGE -XR) 500 MG 24 hr tablet Take 500 mg by mouth daily. 02/05/18   [provider]  NOVOLOG  FLEXPEN 100 UNIT/ML FlexPen Inject 15 Units into the skin in the morning, at noon, and at bedtime. Per Sliding Scale 04/05/19   [provider]  OLANZapine  (ZYPREXA ) 5 MG tablet TAKE ONE TABLET THE DAY BEFORE, THE DAY OF, AND THE DAY AFTER SELINEXOR . 03/18/23   Heilingoetter, Cassandra L, PA-C  omeprazole  (PRILOSEC) 20 MG capsule Take 20 mg by mouth 2 (two) times daily. 08/16/21   [provider]  ondansetron  (ZOFRAN ) 8 MG tablet Take 1 tablet (8 mg total) by mouth every 8 (eight) hours as needed for nausea or vomiting. 04/04/23   Heilingoetter, Cassandra L, PA-C  pravastatin  (PRAVACHOL ) 80 MG tablet Take 80 mg by mouth daily. 08/28/21   [provider]  PROAIR  HFA 108 (90  Base) MCG/ACT inhaler Reported on 06/21/2015 04/04/15   [provider]  prochlorperazine  (COMPAZINE ) 10 MG tablet Take 1 tablet (10 mg total) by mouth every 6 (six) hours as needed for nausea or vomiting. 09/19/22   Marlene Simas, MD  sertraline  (ZOLOFT ) 50 MG tablet TAKE 3 TABLETS BY MOUTH EVERY DAY 02/11/23   Lo, Juvenal Opoka, CNM  verapamil  (CALAN -SR) 240 MG CR tablet Take 240 mg by mouth 2 (two) times daily.    [provider]  XPOVIO , 60 MG ONCE WEEKLY, Therapy Pack (60 mg once weekly) TAKE 1 TABLET BY MOUTH ONCE WEEKLY 05/13/23   Marlene Simas, MD     Family History  Problem Relation Age of Onset   Colon polyps Mother    Stroke Mother    Heart disease Mother        heart cath   Asthma Mother  Endometriosis Mother    Hypertension Mother    Hyperlipidemia Mother    Hyperlipidemia Father    Heart disease Father    Colon polyps Sister    Leukemia Maternal Grandmother    Heart disease Paternal Grandmother    Esophageal cancer Neg Hx    Rectal cancer Neg Hx    Stomach cancer Neg Hx     Social History   Socioeconomic History   Marital status: Single    Spouse name: Not on file   Number of children: 0   Years of education: Not on file   Highest education level: Not on file  Occupational History   Occupation: Chiropodist  Tobacco Use   Smoking status: Never   Smokeless tobacco: Never  Vaping Use   Vaping status: Never Used  Substance and Sexual Activity   Alcohol use: No   Drug use: No   Sexual activity: Not Currently    Birth control/protection: Post-menopausal, Surgical  Other Topics Concern   Not on file  Social History Narrative   Not on file   Social Drivers of Health   Financial Resource Strain: Not on file  Food Insecurity: Low Risk  (06/18/2022)   [facility]   Hunger Vital Sign    Worried About Running Out of Food in the Last Year: Never true    Ran Out of Food in the Last Year: Never true  Transportation  Needs: No Transportation Needs (06/18/2022)   [facility]   Transportation    In the past 12 months, has lack of reliable transportation kept you from medical appointments, meetings, work or from getting things needed for daily living? : No  Physical Activity: Not on file  Stress: Not on file  Social Connections: Not on file       Review of Systems  Vital Signs:   Advance Care Plan: no documents on file  Physical Exam  Imaging: US  PELVIS TRANSVAGINAL NON-OB (TV ONLY) Result Date: 05/25/2023 CLINICAL DATA:  h/o right ovarian cyst  EXAM: TRANSABDOMINAL ULTRASOUND OF PELVIS  TECHNIQUE: Transabdominal technique was performed for global imaging of the pelvis including uterus, ovaries, adnexal regions, and pelvic cul-de-sac. COMPARISON:  11/23/2022 FINDINGS: Uterus: Complete measurements could not be done today as uterus was seen from the lower uterine segment up to the fundus.  Cervix not well-visualized.  No abnormal findings noted within the uterus.  Endometrial thickness: 6mm, no abnormal blood flow noted.  Right ovary: 5.9 x 5.3 x 0.6 cm.  Volume: 92.37ml  Cystic lesion again noted measuring 4.2 x 4.2 x 4.9 cm.  Cyst is 45 mL.  Cystic lesion is stable. Left ovary: Not well-visualized  Other findings:  No abnormal free fluid.   Labs:  CBC: Recent Labs    05/20/23 0837 05/27/23 0912 06/04/23 0904 06/10/23 1456  WBC 2.9* 2.4* 2.5* 1.9*  HGB 8.1* 7.9* 8.8* 8.5*  HCT 25.4* 24.6* 27.7* 26.5*  PLT 83* 70* 106* 57*    COAGS: No results for input(s): "INR", "APTT" in the last 8760 hours.  BMP: Recent Labs    05/06/23 0942 05/20/23 0837 05/27/23 0912 06/04/23 0904  NA 137 139 139 138  K 4.0 3.7 4.2 4.0  CL 105 107 106 107  CO2 25 26 26 24   GLUCOSE 182* 151* 138* 131*  BUN 6* 7* 9 8  CALCIUM 8.6* 9.0 8.7* 8.7*  CREATININE 0.69 0.70 0.74 0.69  GFRNONAA >60 >60 >60 >60  LIVER FUNCTION TESTS: Recent Labs    05/06/23 0942 05/20/23 0837  05/27/23 0912 06/04/23 0904  BILITOT 0.7 0.6 0.7 0.7  AST 40 27 21 25   ALT 48* 19 16 19   ALKPHOS 143* 126 104 120  PROT 6.3* 6.4* 6.1* 6.4*  ALBUMIN 4.3 4.2 4.1 4.3    TUMOR MARKERS: No results for input(s): "AFPTM", "CEA", "CA199", "CHROMGRNA" in the last 8760 hours.  Assessment and Plan: 63 y.o. 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 Her myeloma panel showed continuous increase in her free lambda light chain. She is scheduled today for image guided bone marrow biopsy to assess status of multiple myeloma to see if canididate to move forward with a transplant.Risks and benefits of procedure was discussed with the patient  including, but not limited to bleeding, infection, damage to adjacent structures or low yield requiring additional tests.  All of the questions were answered and there is agreement to proceed.  Consent signed and in chart.  Pt known to IR team from prior BM biopsies in 2010, 2014, 2022 and port a cath placement on 03/08/23  Thank you for allowing our service to participate in Brenda Conner 's care.  Electronically Signed: D. Honore Lux, PA-C   06/12/2023, 11:42 AM      I spent a total of    15 Minutes in face to face in clinical consultation, greater than 50% of which was counseling/coordinating care for image guided bone marrow biopsy

## 2023-06-13 ENCOUNTER — Ambulatory Visit (HOSPITAL_COMMUNITY)
Admission: RE | Admit: 2023-06-13 | Discharge: 2023-06-13 | Disposition: A | Discharge status: Home / Self Care | Source: Ambulatory Visit | Attending: Physician Assistant | Admitting: Physician Assistant

## 2023-06-13 ENCOUNTER — Other Ambulatory Visit: Payer: Self-pay

## 2023-06-13 ENCOUNTER — Ambulatory Visit (HOSPITAL_COMMUNITY)
Admission: RE | Admit: 2023-06-13 | Discharge: 2023-06-13 | Disposition: A | Discharge status: Home / Self Care | Source: Ambulatory Visit | Attending: Internal Medicine | Admitting: Internal Medicine

## 2023-06-13 ENCOUNTER — Encounter (HOSPITAL_COMMUNITY): Payer: Self-pay

## 2023-06-13 DIAGNOSIS — E785 Hyperlipidemia, unspecified: Secondary | ICD-10-CM | POA: Diagnosis not present

## 2023-06-13 DIAGNOSIS — Z794 Long term (current) use of insulin: Secondary | ICD-10-CM | POA: Diagnosis not present

## 2023-06-13 DIAGNOSIS — Z1379 Encounter for other screening for genetic and chromosomal anomalies: Secondary | ICD-10-CM | POA: Insufficient documentation

## 2023-06-13 DIAGNOSIS — G4730 Sleep apnea, unspecified: Secondary | ICD-10-CM | POA: Insufficient documentation

## 2023-06-13 DIAGNOSIS — Z7984 Long term (current) use of oral hypoglycemic drugs: Secondary | ICD-10-CM | POA: Insufficient documentation

## 2023-06-13 DIAGNOSIS — K219 Gastro-esophageal reflux disease without esophagitis: Secondary | ICD-10-CM | POA: Insufficient documentation

## 2023-06-13 DIAGNOSIS — C9000 Multiple myeloma not having achieved remission: Secondary | ICD-10-CM | POA: Insufficient documentation

## 2023-06-13 DIAGNOSIS — E669 Obesity, unspecified: Secondary | ICD-10-CM | POA: Insufficient documentation

## 2023-06-13 DIAGNOSIS — I10 Essential (primary) hypertension: Secondary | ICD-10-CM | POA: Diagnosis not present

## 2023-06-13 LAB — CBC WITH DIFFERENTIAL/PLATELET
Abs Immature Granulocytes: 0.02 10*3/uL (ref 0.00–0.07)
Basophils Absolute: 0 10*3/uL (ref 0.0–0.1)
Basophils Relative: 0 %
Eosinophils Absolute: 0 10*3/uL (ref 0.0–0.5)
Eosinophils Relative: 0 %
HCT: 28.4 % — ABNORMAL LOW (ref 36.0–46.0)
Hemoglobin: 8.7 g/dL — ABNORMAL LOW (ref 12.0–15.0)
Immature Granulocytes: 1 %
Lymphocytes Relative: 27 %
Lymphs Abs: 1 10*3/uL (ref 0.7–4.0)
MCH: 28.2 pg (ref 26.0–34.0)
MCHC: 30.6 g/dL (ref 30.0–36.0)
MCV: 92.2 fL (ref 80.0–100.0)
Monocytes Absolute: 0.4 10*3/uL (ref 0.1–1.0)
Monocytes Relative: 9 %
Neutro Abs: 2.4 10*3/uL (ref 1.7–7.7)
Neutrophils Relative %: 63 %
Platelets: 90 10*3/uL — ABNORMAL LOW (ref 150–400)
RBC: 3.08 MIL/uL — ABNORMAL LOW (ref 3.87–5.11)
RDW: 16.6 % — ABNORMAL HIGH (ref 11.5–15.5)
WBC: 3.8 10*3/uL — ABNORMAL LOW (ref 4.0–10.5)
nRBC: 0 % (ref 0.0–0.2)

## 2023-06-13 LAB — GLUCOSE, CAPILLARY: Glucose-Capillary: 148 mg/dL — ABNORMAL HIGH (ref 70–99)

## 2023-06-13 MED ORDER — FENTANYL CITRATE (PF) 100 MCG/2ML IJ SOLN
INTRAMUSCULAR | Status: AC | PRN
  Administered 2023-06-13: 50 ug via INTRAVENOUS

## 2023-06-13 MED ORDER — SODIUM CHLORIDE 0.9 % IV SOLN: INTRAVENOUS | Status: DC

## 2023-06-13 MED ORDER — MIDAZOLAM HCL 2 MG/2ML IJ SOLN
INTRAMUSCULAR | Status: AC
  Filled 2023-06-13: qty 2

## 2023-06-13 MED ORDER — MIDAZOLAM HCL 2 MG/2ML IJ SOLN
INTRAMUSCULAR | Status: AC | PRN
  Administered 2023-06-13: 1 mg via INTRAVENOUS

## 2023-06-13 MED ORDER — FENTANYL CITRATE (PF) 100 MCG/2ML IJ SOLN
INTRAMUSCULAR | Status: AC
  Filled 2023-06-13: qty 2

## 2023-06-13 MED ORDER — LIDOCAINE HCL 1 % IJ SOLN
INTRAMUSCULAR | Status: AC | PRN
  Administered 2023-06-13: 10 mL via INTRADERMAL

## 2023-06-13 NOTE — Discharge Instructions (Signed)
 Please call Interventional Radiology clinic [phone] with any questions or concerns.  You may remove your dressing and shower tomorrow.   Bone Marrow Aspiration and Bone Marrow Biopsy, Adult, Care After This sheet gives you information about how to care for yourself after your procedure. Your health care provider may also give you more specific instructions. If you have problems or questions, contact your health care provider. What can I expect after the procedure? After the procedure, it is common to have: Mild pain and tenderness. Swelling. Bruising. Follow these instructions at home: Puncture site care Follow instructions from your health care provider about how to take care of the puncture site. Make sure you: Wash your hands with soap and water before and after you change your bandage (dressing). If soap and water are not available, use hand sanitizer. Change your dressing as told by your health care provider. Check your puncture site every day for signs of infection. Check for: More redness, swelling, or pain. Fluid or blood. Warmth. Pus or a bad smell.   Activity Return to your normal activities as told by your health care provider. Ask your health care provider what activities are safe for you. Do not lift anything that is heavier than 10 lb (4.5 kg), or the limit that you are told, until your health care provider says that it is safe. Do not drive for 24 hours if you were given a sedative during your procedure. General instructions Take over-the-counter and prescription medicines only as told by your health care provider. Do not take baths, swim, or use a hot tub until your health care provider approves. Ask your health care provider if you may take showers. You may only be allowed to take sponge baths. If directed, put ice on the affected area. To do this: Put ice in a plastic bag. Place a towel between your skin and the bag. Leave the ice on for 20 minutes, 2-3 times a  day. Keep all follow-up visits as told by your health care provider. This is important.   Contact a health care provider if: Your pain is not controlled with medicine. You have a fever. You have more redness, swelling, or pain around the puncture site. You have fluid or blood coming from the puncture site. Your puncture site feels warm to the touch. You have pus or a bad smell coming from the puncture site. Summary After the procedure, it is common to have mild pain, tenderness, swelling, and bruising. Follow instructions from your health care provider about how to take care of the puncture site and what activities are safe for you. Take over-the-counter and prescription medicines only as told by your health care provider. Contact a health care provider if you have any signs of infection, such as fluid or blood coming from the puncture site. This information is not intended to replace advice given to you by your health care provider. Make sure you discuss any questions you have with your health care provider. Document Revised: 05/13/2018 Document Reviewed: 05/13/2018 Elsevier Patient Education  2021 [employer]   Moderate Conscious Sedation, Adult, Care After This sheet gives you information about how to care for yourself after your procedure. Your health care provider may also give you more specific instructions. If you have problems or questions, contact your health care provider. What can I expect after the procedure? After the procedure, it is common to have: Sleepiness for several hours. Impaired judgment for several hours. Difficulty with balance. Vomiting if you eat too  soon. Follow these instructions at home: For the time period you were told by your health care provider: Rest. Do not participate in activities where you could fall or become injured. Do not drive or use machinery. Do not drink alcohol. Do not take sleeping pills or medicines that cause drowsiness. Do not  make important decisions or sign legal documents. Do not take care of children on your own.      Eating and drinking Follow the diet recommended by your health care provider. Drink enough fluid to keep your urine pale yellow. If you vomit: Drink water, juice, or soup when you can drink without vomiting. Make sure you have little or no nausea before eating solid foods.   General instructions Take over-the-counter and prescription medicines only as told by your health care provider. Have a responsible adult stay with you for the time you are told. It is important to have someone help care for you until you are awake and alert. Do not smoke. Keep all follow-up visits as told by your health care provider. This is important. Contact a health care provider if: You are still sleepy or having trouble with balance after 24 hours. You feel light-headed. You keep feeling nauseous or you keep vomiting. You develop a rash. You have a fever. You have redness or swelling around the IV site. Get help right away if: You have trouble breathing. You have new-onset confusion at home. Summary After the procedure, it is common to feel sleepy, have impaired judgment, or feel nauseous if you eat too soon. Rest after you get home. Know the things you should not do after the procedure. Follow the diet recommended by your health care provider and drink enough fluid to keep your urine pale yellow. Get help right away if you have trouble breathing or new-onset confusion at home. This information is not intended to replace advice given to you by your health care provider. Make sure you discuss any questions you have with your health care provider. Document Revised: 04/24/2019 Document Reviewed: 11/20/2018 Elsevier Patient Education  2021 [employer].

## 2023-06-13 NOTE — Progress Notes (Signed)
 Patient transported to [facility] in stable condition via stretcher. Handoff report given to Jason C, RN.

## 2023-06-13 NOTE — Procedures (Signed)
 Vascular and Interventional Radiology Procedure Note  Patient: LOGYN DEDOMINICIS DOB: March 27, 1960 Medical Record Number: [id number] Note Date/Time: 06/13/23 9:11 AM   Performing Physician: Art Largo, MD Assistant(s): None  Diagnosis: Hx MM   Procedure: BONE MARROW ASPIRATION and BIOPSY  Anesthesia: Conscious Sedation Complications: None Estimated Blood Loss: Minimal Specimens: Sent for Pathology  Findings:  Successful CT-guided bone marrow aspiration and biopsy A total of 1 cores were obtained. Hemostasis of the tract was achieved using Manual Pressure.  Plan: Bed rest for 1 hours.  See detailed procedure note with images in PACS. The patient tolerated the procedure well without incident or complication and was returned to Recovery in stable condition.    Art Largo, MD [facility]   Pager. [phone] Clinic. [phone]

## 2023-06-17 ENCOUNTER — Other Ambulatory Visit: Payer: Self-pay | Admitting: Physician Assistant

## 2023-06-17 ENCOUNTER — Inpatient Hospital Stay

## 2023-06-17 ENCOUNTER — Encounter: Payer: Self-pay | Admitting: Internal Medicine

## 2023-06-17 ENCOUNTER — Inpatient Hospital Stay: Admitting: Internal Medicine

## 2023-06-17 ENCOUNTER — Encounter (HOSPITAL_COMMUNITY)
Admission: RE | Admit: 2023-06-17 | Discharge: 2023-06-17 | Disposition: A | Discharge status: Home / Self Care | Source: Ambulatory Visit | Attending: Physician Assistant | Admitting: Physician Assistant

## 2023-06-17 VITALS — BP 151/78 | HR 67 | Temp 97.4°F | Resp 18 | Ht 67.0 in | Wt 259.5 lb

## 2023-06-17 DIAGNOSIS — Z95828 Presence of other vascular implants and grafts: Secondary | ICD-10-CM

## 2023-06-17 DIAGNOSIS — D649 Anemia, unspecified: Secondary | ICD-10-CM

## 2023-06-17 DIAGNOSIS — C9000 Multiple myeloma not having achieved remission: Secondary | ICD-10-CM

## 2023-06-17 DIAGNOSIS — T451X5A Adverse effect of antineoplastic and immunosuppressive drugs, initial encounter: Secondary | ICD-10-CM

## 2023-06-17 DIAGNOSIS — D701 Agranulocytosis secondary to cancer chemotherapy: Secondary | ICD-10-CM

## 2023-06-17 LAB — CMP (CANCER CENTER ONLY)
ALT: 28 U/L (ref 0–44)
AST: 34 U/L (ref 15–41)
Albumin: 4.3 g/dL (ref 3.5–5.0)
Alkaline Phosphatase: 145 U/L — ABNORMAL HIGH (ref 38–126)
Anion gap: 7 (ref 5–15)
BUN: 10 mg/dL (ref 8–23)
CO2: 26 mmol/L (ref 22–32)
Calcium: 8.9 mg/dL (ref 8.9–10.3)
Chloride: 106 mmol/L (ref 98–111)
Creatinine: 0.76 mg/dL (ref 0.44–1.00)
GFR, Estimated: >60 mL/min (ref >60)
Glucose, Bld: 133 mg/dL — ABNORMAL HIGH (ref 70–99)
Potassium: 4.4 mmol/L (ref 3.5–5.1)
Sodium: 139 mmol/L (ref 135–145)
Total Bilirubin: 0.6 mg/dL (ref 0.0–1.2)
Total Protein: 6.2 g/dL — ABNORMAL LOW (ref 6.5–8.1)

## 2023-06-17 LAB — CBC WITH DIFFERENTIAL (CANCER CENTER ONLY)
Abs Immature Granulocytes: 0.01 10*3/uL (ref 0.00–0.07)
Basophils Absolute: 0 10*3/uL (ref 0.0–0.1)
Basophils Relative: 0 %
Eosinophils Absolute: 0 10*3/uL (ref 0.0–0.5)
Eosinophils Relative: 0 %
HCT: 27.2 % — ABNORMAL LOW (ref 36.0–46.0)
Hemoglobin: 8.8 g/dL — ABNORMAL LOW (ref 12.0–15.0)
Immature Granulocytes: 0 %
Lymphocytes Relative: 27 %
Lymphs Abs: 0.7 10*3/uL (ref 0.7–4.0)
MCH: 28.3 pg (ref 26.0–34.0)
MCHC: 32.4 g/dL (ref 30.0–36.0)
MCV: 87.5 fL (ref 80.0–100.0)
Monocytes Absolute: 0.3 10*3/uL (ref 0.1–1.0)
Monocytes Relative: 11 %
Neutro Abs: 1.6 10*3/uL — ABNORMAL LOW (ref 1.7–7.7)
Neutrophils Relative %: 62 %
Platelet Count: 92 10*3/uL — ABNORMAL LOW (ref 150–400)
RBC: 3.11 MIL/uL — ABNORMAL LOW (ref 3.87–5.11)
RDW: 15.8 % — ABNORMAL HIGH (ref 11.5–15.5)
WBC Count: 2.7 10*3/uL — ABNORMAL LOW (ref 4.0–10.5)
nRBC: 0 % (ref 0.0–0.2)

## 2023-06-17 LAB — TYPE AND SCREEN
ABO/RH(D): B NEG
Antibody Screen: NEGATIVE

## 2023-06-17 LAB — GLUCOSE, CAPILLARY: Glucose-Capillary: 144 mg/dL — ABNORMAL HIGH (ref 70–99)

## 2023-06-17 MED ORDER — DEXTROSE 5 % IV SOLN
56.0000 mg/m2 | Freq: Once | INTRAVENOUS | Status: AC
  Administered 2023-06-17: 120 mg via INTRAVENOUS
  Filled 2023-06-17: qty 60

## 2023-06-17 MED ORDER — SODIUM CHLORIDE 0.9% FLUSH
10.0000 mL | INTRAVENOUS | Status: DC | PRN
  Administered 2023-06-17: 10 mL

## 2023-06-17 MED ORDER — SODIUM CHLORIDE 0.9% FLUSH
10.0000 mL | INTRAVENOUS | Status: DC | PRN
  Administered 2023-06-17: 10 mL via INTRAVENOUS

## 2023-06-17 MED ORDER — FLUDEOXYGLUCOSE F - 18 (FDG) INJECTION
12.5000 | Freq: Once | INTRAVENOUS | Status: AC | PRN
  Administered 2023-06-17: 12.75 via INTRAVENOUS

## 2023-06-17 MED ORDER — HEPARIN SOD (PORK) LOCK FLUSH 100 UNIT/ML IV SOLN
500.0000 [IU] | Freq: Once | INTRAVENOUS | Status: AC | PRN
  Administered 2023-06-17: 500 [IU]

## 2023-06-17 MED ORDER — SODIUM CHLORIDE 0.9 % IV SOLN: Freq: Once | INTRAVENOUS | Status: AC

## 2023-06-17 NOTE — Progress Notes (Signed)
 Patient reports she took her decadron  at home today.

## 2023-06-17 NOTE — Patient Instructions (Signed)

## 2023-06-17 NOTE — Progress Notes (Signed)
 [facility] [phone]   [phone]  OFFICE PROGRESS NOTE  Brenda Bloch, MD [address] [place] [address]  DIAGNOSIS: Plasma cell dyscrasia initially diagnosed as MGUS in September 2010, with additional symptoms suggestive of POEMS syndrome.   PRIOR THERAPY:  1) Velcade  1.3 MG/M2 subcutaneously with Decadron  40 mg by mouth on a weekly basis. First cycle 11/24/2013. She status post 31 weekly doses of treatment. 2) Velcade  1.3 MG/M2 subcutaneously and weekly basis with Decadron  40 mg by mouth weekly. First dose 02/01/2015. Status post 28 cycles. 3) Revlimid  25 mg by mouth daily for 21 days every 4 weeks with weekly Decadron  20 mg. started in 11/27/2015. Status post 3 cycles discontinued secondary to lack of response. 4) Systemic treatment with Velcade  1.3 MG/KG weekly, Revlimid  25 mg by mouth daily for 21 days every 4 weeks in addition to Decadron  20 mg by mouth weekly. First dose 03/06/2016. Status post 42 cycles.  She has a break off treatment from June 2021 until July 2022. 5) Second line treatment with daratumumab , Pomalyst  3 mg for 21 days every 4 weeks as well as Decadron  20 mg p.o. weekly.  First dose 09/26/2022.  Status post 1 cycle and currently receiving day 15 of cycle #2.  CURRENT THERAPY: Third treatment with selinexor , carfilzomib  and dex  28-day cycle. Carfilzomib  56 mg/m2 IV over 30 minutes on days 1, 8 and 15 (20 mg/m2 with cycle 1, day 1). Escalate to 70 mg/m2 with cycle 2 or 3 as tolerated. Selinexor  60 mg PO on days 1, 8, 15 and 22. Escalate to 80 mg weekly as tolerated. Dex 20 mg PO on days 1, 8, 15 and 22.  She is status post 1 cycle and she is here today for evaluation before day 15 of cycle #2  INTERVAL HISTORY: Brenda Conner 63 y.o. female returns to the clinic today for follow-up visit. Discussed the use of AI scribe software for clinical note transcription with the patient, who gave verbal consent to  proceed.  History of Present Illness   Brenda Conner is a 63 year old female with multiple myeloma who presents for evaluation before starting day one of cycle number four of her treatment.  She has a history of multiple myeloma, initially diagnosed as MGUS in September 2010. She is currently undergoing treatment with selinexor , carfilzomib , and dexamethasone , following a 28-day cycle regimen. She missed day fifteen of cycle number three due to low white blood cell count.  She feels 'a little tired' and attributes some of this fatigue to a recent bone marrow biopsy performed last Thursday. She experiences episodes of sudden fatigue that necessitate taking naps. Her most recent blood counts were a total blood count of 2.7, hemoglobin of 8.8, and platelets at 92,000.  She is scheduled for CAR T-cell therapy, with cell harvesting planned for July 3rd. She will be staying in [place] for at least 30 days for this treatment, during which her sister will accompany her as a caregiver.       MEDICAL HISTORY: Past Medical History:  Diagnosis Date   Acid reflux    Anemia    Anxiety    Asthma    Cancer (HCC)    waldenstroms/ macroglobinulemia   Depression    Depression    Diabetes mellitus without complication (HCC)    Dysuria 02/28/2016   Gallstones    Heart palpitations    Hypercholesteremia  Hypertension    Hypothyroidism    Macroglobulinemia    ? POEMS syndrome   Monoclonal gammopathy of unknown significance (MGUS)    Multiple myeloma (HCC)    Obesity    POEMS syndrome    PONV (postoperative nausea and vomiting)    Sleep apnea    CPAP at bedtime    ALLERGIES:  is allergic to codeine, hydrocodone , lortab [hydrocodone -acetaminophen ], onion, shellfish allergy, and amoxicillin.  MEDICATIONS:  Current Outpatient Medications  Medication Sig Dispense Refill   ACCU-CHEK AVIVA PLUS test strip USE TO CHECK SUGAR TWICE A DAY 90     acyclovir  (ZOVIRAX ) 400 MG tablet Take 1 tablet  (400 mg total) by mouth daily. 30 tablet 3   ALPRAZolam  (XANAX ) 0.5 MG tablet Take 1 tablet (0.5 mg total) by mouth at bedtime as needed for anxiety. 30 tablet 1   aspirin 81 MG chewable tablet Chew by mouth daily.     Azelastine  HCl 0.15 % SOLN as needed.     azithromycin  (ZITHROMAX ) 250 MG tablet Use as instructed 6 each 0   B-D UF III MINI PEN NEEDLES 31G X 5 MM MISC      Blood Glucose Monitoring Suppl (ONE TOUCH ULTRA MINI) W/DEVICE KIT See admin instructions. Reported on 01/25/2015  0   cetirizine (ZYRTEC ALLERGY) 10 MG tablet Take 1 tablet by mouth daily.     Continuous Glucose Sensor (FREESTYLE LIBRE 3 SENSOR) MISC USE 1 SENSOR EVERY 14 DAYS     dexamethasone  (DECADRON ) 4 MG tablet Take 5 tablets (20 mg total) by mouth once a week. Take the day after darzalex  faspro. Take with breakfast. 20 tablet 11   dexamethasone  (DECADRON ) 4 MG tablet Take 5 tablets (20mg ) on days 1, 8, 15 and 22 every 28 days 20 tablet 4   DiphenhydrAMINE  HCl (BENADRYL  ALLERGY PO) Take 1 Dose by mouth daily at 6 (six) AM.     fexofenadine (ALLEGRA ALLERGY) 180 MG tablet Take 1 tablet by mouth daily.     Fluticasone-Salmeterol (ADVAIR ) 100-50 MCG/DOSE AEPB Inhale 2 puffs into the lungs every 12 (twelve) hours.     glucosamine-chondroitin 500-400 MG tablet Take 1 tablet by mouth daily.     ibuprofen (ADVIL,MOTRIN) 100 MG tablet Take 100 mg by mouth every 6 (six) hours as needed. Reported on 05/31/2015     Insulin Glargine  (BASAGLAR  KWIKPEN) 100 UNIT/ML Inject 20 Units into the skin at bedtime.     levothyroxine (SYNTHROID ) 137 MCG tablet Take 137 mcg by mouth daily.     lidocaine -prilocaine  (EMLA ) cream Apply 1 Application topically as needed. 30 g 2   lisinopril  (PRINIVIL ,ZESTRIL ) 10 MG tablet Take 10 mg by mouth daily. auth number 05/29/2018 [id number]  3   loperamide (IMODIUM) 2 MG capsule Take 2 mg by mouth as needed for diarrhea or loose stools.     magic mouthwash w/lidocaine  SOLN Take 5 mLs by mouth 4 (four) times  daily as needed for mouth pain. Swish, Gargle, and spit 240 mL 1   Melatonin 5 MG CAPS Take 1 capsule by mouth at bedtime.     metFORMIN  (GLUCOPHAGE -XR) 500 MG 24 hr tablet Take 500 mg by mouth daily.     NOVOLOG  FLEXPEN 100 UNIT/ML FlexPen Inject 15 Units into the skin in the morning, at noon, and at bedtime. Per Sliding Scale     OLANZapine  (ZYPREXA ) 5 MG tablet TAKE ONE TABLET THE DAY BEFORE, THE DAY OF, AND THE DAY AFTER SELINEXOR . 90 tablet 1   omeprazole  (PRILOSEC) 20  MG capsule Take 20 mg by mouth 2 (two) times daily.     ondansetron  (ZOFRAN ) 8 MG tablet Take 1 tablet (8 mg total) by mouth every 8 (eight) hours as needed for nausea or vomiting. 90 tablet 1   pravastatin  (PRAVACHOL ) 80 MG tablet Take 80 mg by mouth daily.     PROAIR  HFA 108 (90 Base) MCG/ACT inhaler Reported on 06/21/2015  1   prochlorperazine  (COMPAZINE ) 10 MG tablet Take 1 tablet (10 mg total) by mouth every 6 (six) hours as needed for nausea or vomiting. 30 tablet 1   sertraline  (ZOLOFT ) 50 MG tablet TAKE 3 TABLETS BY MOUTH EVERY DAY 270 tablet 1   verapamil  (CALAN -SR) 240 MG CR tablet Take 240 mg by mouth 2 (two) times daily.     XPOVIO , 60 MG ONCE WEEKLY, Therapy Pack (60 mg once weekly) TAKE 1 TABLET BY MOUTH ONCE WEEKLY 4 tablet 0   No current facility-administered medications for this visit.   Facility-Administered Medications Ordered in Other Visits  Medication Dose Route Frequency Provider Last Rate Last Admin   sodium chloride  flush (NS) 0.9 % injection 10 mL  10 mL Intravenous PRN Heilingoetter, Cassandra L, PA-C   10 mL at 06/17/23 0840    SURGICAL HISTORY:  Past Surgical History:  Procedure Laterality Date   ABLATION  09/09/2007   HTA and polyp resection   BONE MARROW BIOPSY  04/08/2012   BONE MARROW BIOPSY  01/2020   CATARACT EXTRACTION, BILATERAL Bilateral 02/2022   FOOT SURGERY Bilateral 01/09/1996   small toe   IR IMAGING GUIDED PORT INSERTION  03/08/2023   KNEE ARTHROSCOPY W/ MENISCAL REPAIR  Bilateral 10/10 ; 3/11   LAPAROSCOPIC CHOLECYSTECTOMY  01/09/1995   LUMBAR DISC SURGERY  03/08/2004   herniation, L4-L5   NASAL SINUS SURGERY  01/08/2002   UTERINE FIBROID EMBOLIZATION  2009   HTA and polyp resection    REVIEW OF SYSTEMS:  Constitutional: positive for fatigue Eyes: negative Ears, nose, mouth, throat, and face: negative Respiratory: negative Cardiovascular: negative Gastrointestinal: negative Genitourinary:negative Integument/breast: negative Hematologic/lymphatic: negative Musculoskeletal:negative Neurological: negative Behavioral/Psych: negative Endocrine: negative Allergic/Immunologic: negative   PHYSICAL EXAMINATION: General appearance: alert, cooperative, fatigued, and no distress Head: Normocephalic, without obvious abnormality, atraumatic Neck: no adenopathy Lymph nodes: Cervical, supraclavicular, and axillary nodes normal. Resp: clear to auscultation bilaterally Back: symmetric, no curvature. ROM normal. No CVA tenderness. Cardio: regular rate and rhythm, S1, S2 normal, no murmur, click, rub or gallop GI: soft, non-tender; bowel sounds normal; no masses,  no organomegaly Extremities: extremities normal, atraumatic, no cyanosis or edema Neurologic: Alert and oriented X 3, normal strength and tone. Normal symmetric reflexes. Normal coordination and gait  ECOG PERFORMANCE STATUS: 1 - Symptomatic but completely ambulatory  Blood pressure (!) 151/78, pulse 67, temperature (!) 97.4 F (36.3 C), temperature source Tympanic, resp. rate 18, height 5\' 7"  (1.702 m), weight 259 lb 8 oz (117.7 kg), SpO2 99%.  LABORATORY DATA: Lab Results  Component Value Date   WBC 2.7 (L) 06/17/2023   HGB 8.8 (L) 06/17/2023   HCT 27.2 (L) 06/17/2023   MCV 87.5 06/17/2023   PLT 92 (L) 06/17/2023      Chemistry      Component Value Date/Time   NA 138 06/04/2023 0904   NA 138 11/14/2016 0824   K 4.0 06/04/2023 0904   K 4.3 11/14/2016 0824   CL 107 06/04/2023 0904    CL 104 04/07/2012 1415   CO2 24 06/04/2023 0904   CO2 23 11/14/2016  0824   BUN 8 06/04/2023 0904   BUN 9.1 11/14/2016 0824   CREATININE 0.69 06/04/2023 0904   CREATININE 0.7 11/14/2016 0824      Component Value Date/Time   CALCIUM 8.7 (L) 06/04/2023 0904   CALCIUM 9.6 11/14/2016 0824   ALKPHOS 120 06/04/2023 0904   ALKPHOS 94 11/14/2016 0824   AST 25 06/04/2023 0904   AST 28 11/14/2016 0824   ALT 19 06/04/2023 0904   ALT 24 11/14/2016 0824   BILITOT 0.7 06/04/2023 0904   BILITOT 0.41 11/14/2016 0824      ASSESSMENT AND PLAN:  This is a very pleasant 63 years old white female with multiple myeloma with questionable POEMS syndrome symptom.  The patient has been on treatment with subcutaneous weekly Velcade  as well as Revlimid  and Decadron  status post 34 cycles.  She has been tolerating this treatment well. The patient has been on observation for the last several months and she has been doing fine with no concerning issues except for the recent upper respiratory infection and viral gastroenteritis. Her myeloma panel showed continuous increase in her free lambda light chain. The patient had a bone marrow biopsy and aspirate recently that showed 3-5% plasma cells still suspicious for plasma cell dyscrasia.  The skeletal bone survey was negative for any lytic lesions. The patient has been off treatment for more than a year between June 2021 until July 2022. She had repeat myeloma panel that showed significant worsening and increase of her lambda light chain.  She continues to have anemia but no renal insufficiency or hypercalcemia. She resumed her treatment with Velcade , Revlimid  and Decadron  on July 19, 2020.   The patient is currently on observation and she is feeling fine with no concerning complaints. She was seen recently by Dr. Pandora Bogaert at [facility] for second opinion and after extensive investigation is recommended for her to resume her treatment for the multiple myeloma  with daratumumab , Pomalyst  3 Mg p.o. daily for 21 days every 4 weeks and Decadron  20 mg weekly.  She is status post 2 cycles.  Her treatment was discontinued secondary to disease progression. The patient started third line treatment with selinexor , carfilzomib  as well as Decadron .  She is status post 4 cycles.   The patient has been tolerating this treatment well except for the pancytopenia. Assessment and Plan    Multiple myeloma Initially diagnosed as MGUS in September 2010. Currently undergoing treatment with selinexor , carfilzomib , and dexamethasone . Missed day 15 of cycle 3 due to leukopenia, which has since resolved. Awaiting PET scan and bone marrow biopsy results. Scheduled for CAR T-cell therapy with cell harvest on July 3rd in [place]. Caregiver arrangements confirmed for therapy duration. - Continue selinexor , carfilzomib , and dexamethasone  as per cycle schedule. - Review PET scan and bone marrow biopsy results when available. - Proceed with CAR T-cell therapy preparation, including cell harvest on July 3rd. - Coordinate with [facility] in [place] for CAR T-cell therapy. - Ensure caregiver availability for CAR T-cell therapy duration.  Anemia Hemoglobin levels have improved to acceptable levels for treatment.   She was advised to call immediately if she has any concerning symptoms in the interval. She had repeat myeloma panel performed at [facility] in [place]  that showed significant improvement of her disease on the current regimen.   All questions were answered. The patient knows to call the clinic with any problems, questions or concerns. We can certainly see the patient much sooner if necessary.  Disclaimer: This  note was dictated with voice recognition software. Similar sounding words can inadvertently be transcribed and may not be corrected upon review.

## 2023-06-18 LAB — SURGICAL PATHOLOGY

## 2023-06-24 ENCOUNTER — Inpatient Hospital Stay

## 2023-06-24 ENCOUNTER — Encounter: Payer: Self-pay | Admitting: Medical Oncology

## 2023-06-24 ENCOUNTER — Encounter (HOSPITAL_COMMUNITY): Payer: Self-pay | Admitting: Physician Assistant

## 2023-06-24 DIAGNOSIS — C9000 Multiple myeloma not having achieved remission: Secondary | ICD-10-CM

## 2023-06-24 LAB — CMP (CANCER CENTER ONLY)
ALT: 26 U/L (ref 0–44)
AST: 26 U/L (ref 15–41)
Albumin: 4.1 g/dL (ref 3.5–5.0)
Alkaline Phosphatase: 127 U/L — ABNORMAL HIGH (ref 38–126)
Anion gap: 7 (ref 5–15)
BUN: 9 mg/dL (ref 8–23)
CO2: 23 mmol/L (ref 22–32)
Calcium: 8.6 mg/dL — ABNORMAL LOW (ref 8.9–10.3)
Chloride: 111 mmol/L (ref 98–111)
Creatinine: 0.72 mg/dL (ref 0.44–1.00)
GFR, Estimated: >60 mL/min (ref >60)
Glucose, Bld: 115 mg/dL — ABNORMAL HIGH (ref 70–99)
Potassium: 4 mmol/L (ref 3.5–5.1)
Sodium: 141 mmol/L (ref 135–145)
Total Bilirubin: 0.6 mg/dL (ref 0.0–1.2)
Total Protein: 6.1 g/dL — ABNORMAL LOW (ref 6.5–8.1)

## 2023-06-24 LAB — CBC WITH DIFFERENTIAL (CANCER CENTER ONLY)
Abs Immature Granulocytes: 0.01 10*3/uL (ref 0.00–0.07)
Basophils Absolute: 0 10*3/uL (ref 0.0–0.1)
Basophils Relative: 0 %
Eosinophils Absolute: 0 10*3/uL (ref 0.0–0.5)
Eosinophils Relative: 0 %
HCT: 25.3 % — ABNORMAL LOW (ref 36.0–46.0)
Hemoglobin: 8.3 g/dL — ABNORMAL LOW (ref 12.0–15.0)
Immature Granulocytes: 0 %
Lymphocytes Relative: 26 %
Lymphs Abs: 0.6 10*3/uL — ABNORMAL LOW (ref 0.7–4.0)
MCH: 28.4 pg (ref 26.0–34.0)
MCHC: 32.8 g/dL (ref 30.0–36.0)
MCV: 86.6 fL (ref 80.0–100.0)
Monocytes Absolute: 0.2 10*3/uL (ref 0.1–1.0)
Monocytes Relative: 9 %
Neutro Abs: 1.5 10*3/uL — ABNORMAL LOW (ref 1.7–7.7)
Neutrophils Relative %: 65 %
Platelet Count: 56 10*3/uL — ABNORMAL LOW (ref 150–400)
RBC: 2.92 MIL/uL — ABNORMAL LOW (ref 3.87–5.11)
RDW: 15.9 % — ABNORMAL HIGH (ref 11.5–15.5)
WBC Count: 2.3 10*3/uL — ABNORMAL LOW (ref 4.0–10.5)
nRBC: 0 % (ref 0.0–0.2)

## 2023-06-24 LAB — SAMPLE TO BLOOD BANK

## 2023-06-24 NOTE — Patient Instructions (Signed)

## 2023-06-25 ENCOUNTER — Encounter: Payer: Self-pay | Admitting: Internal Medicine

## 2023-06-25 ENCOUNTER — Other Ambulatory Visit: Payer: Self-pay | Admitting: Physician Assistant

## 2023-06-25 ENCOUNTER — Telehealth: Payer: Self-pay | Admitting: Medical Oncology

## 2023-06-25 DIAGNOSIS — C9000 Multiple myeloma not having achieved remission: Secondary | ICD-10-CM

## 2023-06-25 MED ORDER — SELINEXOR (60 MG ONCE WEEKLY) 60 MG PO TBPK: 60.0000 mg | ORAL_TABLET | ORAL | 0 refills | Status: AC

## 2023-06-25 MED ORDER — ONDANSETRON HCL 8 MG PO TABS
8.0000 mg | ORAL_TABLET | Freq: Three times a day (TID) | ORAL | 1 refills | Status: AC | PRN
Start: 2023-06-25 — End: ?

## 2023-06-25 NOTE — Telephone Encounter (Signed)
 Pt notified that Brenda Conner refilled the zofran  and her cancer pill. She also  adjusted her care plan. She  will cancel the infusions on 6/23 and 6/30 and scheduled an appt for 07/072025

## 2023-06-26 ENCOUNTER — Other Ambulatory Visit: Payer: Self-pay

## 2023-06-26 ENCOUNTER — Encounter (HOSPITAL_COMMUNITY): Payer: Self-pay | Admitting: Physician Assistant

## 2023-06-30 ENCOUNTER — Other Ambulatory Visit: Payer: Self-pay

## 2023-07-01 ENCOUNTER — Other Ambulatory Visit

## 2023-07-01 ENCOUNTER — Ambulatory Visit

## 2023-07-01 ENCOUNTER — Ambulatory Visit: Admitting: Physician Assistant

## 2023-07-08 ENCOUNTER — Inpatient Hospital Stay

## 2023-07-08 ENCOUNTER — Other Ambulatory Visit

## 2023-07-08 ENCOUNTER — Ambulatory Visit

## 2023-07-10 ENCOUNTER — Other Ambulatory Visit: Payer: Self-pay | Admitting: Physician Assistant

## 2023-07-10 DIAGNOSIS — C9000 Multiple myeloma not having achieved remission: Secondary | ICD-10-CM

## 2023-07-11 ENCOUNTER — Other Ambulatory Visit: Payer: Self-pay

## 2023-07-11 NOTE — Progress Notes (Signed)
 Pt arrived ambulatory in stable condition with sister as care giver. Alert and oriented. Pt and care giver to IR waiting room ambulatory with RN at 0615.   Pt returned from IR with care giver via w/c with transport at 0835. Alert and oriented. VSS. Pt to bathroom prior to procedure. Vascath accessed, labs drawn.  CMNC performed, see flowsheet for details. Reviewed hypocalcemia symptoms with pt and care giver, pt to notify RN if symptoms occur, pt receptive. Pt with soreness at vascath insertion site, reviewed PRN mediations with pt. Pt given PRN tylenol .  Pt with numbness/tingling to hands bilaterally, PRN tums given. Symptoms resolved after one PRN dose.   Pt with elevated BP at 1230, asymptomatic. Pt advised to take home BP med held this am for procedure. Pt took home med at approx 1300.  Pt seen by NP Lelon at bedside at 1400, notified of pts elevated BP after taking home med, pt asymptomatic, pt advised to return to home med BP schedule, pt receptive. Repeat HGB 7.7, pt with MD f/u this week, asymptomatic. Pt okay for d/c per NP Lelon.   Vascath removed 1 hour post procedure. Pt given additional dressing supplies and educated on aftercare:  Keep dressing in place for 24 hours. Site needs to be bandaged until scabbing occurs. Do not get bandage wet.   Watch for signs of infection such as redness, swelling, drainage, fevers. Notified MD if any signs of infection occur.  Inspect for bleeding, if any bleeding hold firm pressure for 5-10 minutes, apply additional dressing and leave on 4 hours before removal.  If uncontrolled bleeding should occur at home, keep firm pressure directly on the site and call 911 immediately.  Pt advised my feel tired the rest of the day, feeling should resolve within a day.   Pt with diaphoresis upon ambulation after line removal. Pt endorses feeling similar to when BG is low. Pt assisted into w/c, given coke. Pt also given graham crackers and peanut butter. Pt  given second coke. Pt with resolution of diaphoresis and feeling like BG is low after second coke. Pt states feels ready for d/c, pt assisted to car via w/c with RN. Care giver and belongings in car.

## 2023-07-14 ENCOUNTER — Other Ambulatory Visit: Payer: Self-pay | Admitting: Physician Assistant

## 2023-07-14 DIAGNOSIS — C9000 Multiple myeloma not having achieved remission: Secondary | ICD-10-CM

## 2023-07-14 NOTE — Progress Notes (Unsigned)
 [facility] OFFICE PROGRESS NOTE  [name], MD [address] [place] [address]  DIAGNOSIS: Multiple Myeloma, initially diagnosed as MGUS in September 2010, with additional symptoms suggestive of POEMS syndrome.   PRIOR THERAPY: 1) Velcade  1.3 MG/M2 subcutaneously with Decadron  40 mg by mouth on a weekly basis. First cycle 11/24/2013. She status post 31 weekly doses of treatment. 2) Velcade  1.3 MG/M2 subcutaneously and weekly basis with Decadron  40 mg by mouth weekly. First dose 02/01/2015. Status post 28 cycles. 3) Revlimid  25 mg by mouth daily for 21 days every 4 weeks with weekly Decadron  20 mg. started in 11/27/2015. Status post 3 cycles discontinued secondary to lack of response. 4) Systemic treatment with Velcade  1.3 MG/KG weekly, Revlimid  25 mg by mouth daily for 21 days every 4 weeks in addition to Decadron  20 mg by mouth weekly. First dose 03/06/2016. Status post 42 cycles.  She has a break off treatment from June 2021 until July 2022. 5) Second line treatment with daratumumab , Pomalyst  3 mg for 21 days every 4 weeks as well as Decadron  20 mg p.o. weekly.  First dose 09/26/2022.  Status post 1 cycle and currently receiving day 15 of cycle #2.  CURRENT THERAPY: Third treatment with selinexor , carfilzomib  and dex 28-day cycle. Carfilzomib  56 mg/m2 IV over 30 minutes on days 1, 8 and 15 (20 mg/m2 with cycle 1, day 1). Escalate to 70 mg/m2 with cycle 2 or 3 as tolerated. Selinexor  60 mg PO on days 1, 8, 15 and 22. Escalate to 80 mg weekly as tolerated. Dex 20 mg PO on days 1, 8, 15 and 22. She is status post day 1 cycle #4  cycles.   INTERVAL HISTORY: Brenda Conner 63 y.o. female returns to the clinic today for a follow-up visit.  The patient was last seen in the clinic by Dr. Sherrod on 06/17/23.  In summary, when the patient was seen by Dr. Sherrod in early February 2025 she is found to have disease progression of her multiple myeloma and after consultation  with Dr. Nichole at [facility]. She recommended treatment with selinexor , carfilzomib  and dex  28-day cycle.   She underwent T cell harvesting on 07/10/23 and tolerated that well.   The goal is to continue her treatment from tomorrow 7/9-7/23 being her last treatment. She then is expected to work with [facility] from 8/4-9/13 for her procedure for CAR T cell therapy.   Her Hbg last week was 7.7 but has improved to 8.3 today.   No fever, infections, nausea, vomiting, nasal congestion, burning with urination, or changes in breathing. She has some fatigue. She also has intermittent diarrhea which is normal for her. She has not had recently nausea or vomiting. She is expected to start back on her selinexor  tomorrow. She has mild bilateral ankle swelling. She has some compression stockings but has not used them recently. he denies abnormal bleeding, or bruising. No signs of infection, nasal congestion, sore throat, dysuria, or skin infections.   She experiences intermittent diarrhea which is normal for her. She is here today for evaluation and repeat blood work before undergoing #5 day 1 which is scheduled for tomorrow.     MEDICAL HISTORY: Past Medical History:  Diagnosis Date   Acid reflux    Anemia    Anxiety    Asthma    Cancer (HCC)    waldenstroms/ macroglobinulemia   Depression    Depression    Diabetes mellitus without complication (HCC)  Dysuria 02/28/2016   Gallstones    Heart palpitations    Hypercholesteremia    Hypertension    Hypothyroidism    Macroglobulinemia    ? POEMS syndrome   Monoclonal gammopathy of unknown significance (MGUS)    Multiple myeloma (HCC)    Obesity    POEMS syndrome    PONV (postoperative nausea and vomiting)    Sleep apnea    CPAP at bedtime    ALLERGIES:  is allergic to codeine, hydrocodone , lortab [hydrocodone -acetaminophen ], onion, shellfish allergy, and amoxicillin.  MEDICATIONS:  Current Outpatient Medications  Medication Sig Dispense  Refill   ACCU-CHEK AVIVA PLUS test strip USE TO CHECK SUGAR TWICE A DAY 90     acyclovir  (ZOVIRAX ) 400 MG tablet Take 1 tablet (400 mg total) by mouth daily. 30 tablet 3   ALPRAZolam  (XANAX ) 0.5 MG tablet Take 1 tablet (0.5 mg total) by mouth at bedtime as needed for anxiety. 30 tablet 1   aspirin 81 MG chewable tablet Chew by mouth daily.     Azelastine  HCl 0.15 % SOLN as needed.     azithromycin  (ZITHROMAX ) 250 MG tablet Use as instructed 6 each 0   B-D UF III MINI PEN NEEDLES 31G X 5 MM MISC      Blood Glucose Monitoring Suppl (ONE TOUCH ULTRA MINI) W/DEVICE KIT See admin instructions. Reported on 01/25/2015  0   cetirizine (ZYRTEC ALLERGY) 10 MG tablet Take 1 tablet by mouth daily.     Continuous Glucose Sensor (FREESTYLE LIBRE 3 SENSOR) MISC USE 1 SENSOR EVERY 14 DAYS     dexamethasone  (DECADRON ) 4 MG tablet Take 5 tablets (20 mg total) by mouth once a week. Take the day after darzalex  faspro. Take with breakfast. 20 tablet 11   dexamethasone  (DECADRON ) 4 MG tablet Take 5 tablets (20mg ) on days 1, 8, 15 and 22 every 28 days 20 tablet 4   DiphenhydrAMINE  HCl (BENADRYL  ALLERGY PO) Take 1 Dose by mouth daily at 6 (six) AM.     fexofenadine (ALLEGRA ALLERGY) 180 MG tablet Take 1 tablet by mouth daily.     Fluticasone-Salmeterol (ADVAIR ) 100-50 MCG/DOSE AEPB Inhale 2 puffs into the lungs every 12 (twelve) hours.     glucosamine-chondroitin 500-400 MG tablet Take 1 tablet by mouth daily.     ibuprofen (ADVIL,MOTRIN) 100 MG tablet Take 100 mg by mouth every 6 (six) hours as needed. Reported on 05/31/2015     Insulin Glargine  (BASAGLAR  KWIKPEN) 100 UNIT/ML Inject 20 Units into the skin at bedtime.     levothyroxine (SYNTHROID ) 137 MCG tablet Take 137 mcg by mouth daily.     lidocaine -prilocaine  (EMLA ) cream Apply 1 Application topically as needed. 30 g 2   lisinopril  (PRINIVIL ,ZESTRIL ) 10 MG tablet Take 10 mg by mouth daily. auth number 05/29/2018 [id number]  3   loperamide (IMODIUM) 2 MG capsule Take 2  mg by mouth as needed for diarrhea or loose stools.     magic mouthwash w/lidocaine  SOLN Take 5 mLs by mouth 4 (four) times daily as needed for mouth pain. Swish, Gargle, and spit 240 mL 1   Melatonin 5 MG CAPS Take 1 capsule by mouth at bedtime.     metFORMIN  (GLUCOPHAGE -XR) 500 MG 24 hr tablet Take 500 mg by mouth daily.     NOVOLOG  FLEXPEN 100 UNIT/ML FlexPen Inject 15 Units into the skin in the morning, at noon, and at bedtime. Per Sliding Scale     OLANZapine  (ZYPREXA ) 5 MG tablet TAKE ONE TABLET THE DAY  BEFORE, THE DAY OF, AND THE DAY AFTER SELINEXOR . 90 tablet 1   omeprazole  (PRILOSEC) 20 MG capsule Take 20 mg by mouth 2 (two) times daily.     ondansetron  (ZOFRAN ) 8 MG tablet Take 1 tablet (8 mg total) by mouth every 8 (eight) hours as needed for nausea or vomiting. 90 tablet 1   pravastatin  (PRAVACHOL ) 80 MG tablet Take 80 mg by mouth daily.     PROAIR  HFA 108 (90 Base) MCG/ACT inhaler Reported on 06/21/2015  1   prochlorperazine  (COMPAZINE ) 10 MG tablet Take 1 tablet (10 mg total) by mouth every 6 (six) hours as needed for nausea or vomiting. 30 tablet 1   selinexor  (XPOVIO , 60 MG ONCE WEEKLY,) Therapy Pack (60 mg once weekly) Take 1 tablet (60 mg total) by mouth once a week. 4 tablet 0   sertraline  (ZOLOFT ) 50 MG tablet TAKE 3 TABLETS BY MOUTH EVERY DAY 270 tablet 1   verapamil  (CALAN -SR) 240 MG CR tablet Take 240 mg by mouth 2 (two) times daily.     No current facility-administered medications for this visit.    SURGICAL HISTORY:  Past Surgical History:  Procedure Laterality Date   ABLATION  09/09/2007   HTA and polyp resection   BONE MARROW BIOPSY  04/08/2012   BONE MARROW BIOPSY  01/2020   CATARACT EXTRACTION, BILATERAL Bilateral 02/2022   FOOT SURGERY Bilateral 01/09/1996   small toe   IR IMAGING GUIDED PORT INSERTION  03/08/2023   KNEE ARTHROSCOPY W/ MENISCAL REPAIR Bilateral 10/10 ; 3/11   LAPAROSCOPIC CHOLECYSTECTOMY  01/09/1995   LUMBAR DISC SURGERY  03/08/2004    herniation, L4-L5   NASAL SINUS SURGERY  01/08/2002   UTERINE FIBROID EMBOLIZATION  2009   HTA and polyp resection    REVIEW OF SYSTEMS:   Constitutional: Positive for fatigue. Negative for chills, fever and unexpected weight change.  HENT: Negative for mouth sores, nosebleeds, sore throat and trouble swallowing.   Eyes: Negative for eye problems and icterus.  Respiratory: Negative for shortness of breath, cough, hemoptysis, and wheezing.   Cardiovascular: Negative for chest pain and leg swelling.  Gastrointestinal: Stable chronic intermittent diarrhea. Negative for vomiting, abdominal pain, nausea, vomiting, or constipation.  Genitourinary: Negative for bladder incontinence, difficulty urinating, dysuria, frequency and hematuria.   Musculoskeletal: Negative for back pain, gait problem, neck pain and neck stiffness.  Skin: Negative for itching and rash.  Neurological: Negative for dizziness, extremity weakness, gait problem, headaches, light-headedness and seizures.  Hematological: Negative for adenopathy. Does not bleed easily. Positive for bruising from phlebotomy.  Psychiatric/Behavioral: Negative for confusion, depression and sleep disturbance. The patient is not nervous/anxious.     PHYSICAL EXAMINATION:  There were no vitals taken for this visit.  ECOG PERFORMANCE STATUS: 1  Physical Exam  Constitutional: Oriented to person, place, and time and well-developed, well-nourished, and in no distress.  HENT:  Head: Normocephalic and atraumatic.  Mouth/Throat: Oropharynx is clear and moist. No oropharyngeal exudate.  Eyes: Conjunctivae are normal. Right eye exhibits no discharge. Left eye exhibits no discharge. No scleral icterus.  Neck: Normal range of motion. Neck supple.  Cardiovascular: Normal rate, regular rhythm, normal heart sounds and intact distal pulses.   Pulmonary/Chest: Effort normal and breath sounds normal. No respiratory distress. No wheezes. No rales.  Abdominal:  Soft. Bowel sounds are normal. Exhibits no distension and no mass. There is no tenderness.  Musculoskeletal: Normal range of motion. Exhibits no edema.  Lymphadenopathy:    No cervical adenopathy.  Neurological: Alert  and oriented to person, place, and time. Exhibits normal muscle tone. Gait normal. Coordination normal.  Skin: Skin is warm and dry. No rash noted. Not diaphoretic. No erythema. No pallor.  Psychiatric: Mood, memory and judgment normal.  Vitals reviewed.  LABORATORY DATA: Lab Results  Component Value Date   WBC 2.3 (L) 06/24/2023   HGB 8.3 (L) 06/24/2023   HCT 25.3 (L) 06/24/2023   MCV 86.6 06/24/2023   PLT 56 (L) 06/24/2023      Chemistry      Component Value Date/Time   NA 141 06/24/2023 0934   NA 138 11/14/2016 0824   K 4.0 06/24/2023 0934   K 4.3 11/14/2016 0824   CL 111 06/24/2023 0934   CL 104 04/07/2012 1415   CO2 23 06/24/2023 0934   CO2 23 11/14/2016 0824   BUN 9 06/24/2023 0934   BUN 9.1 11/14/2016 0824   CREATININE 0.72 06/24/2023 0934   CREATININE 0.7 11/14/2016 0824      Component Value Date/Time   CALCIUM 8.6 (L) 06/24/2023 0934   CALCIUM 9.6 11/14/2016 0824   ALKPHOS 127 (H) 06/24/2023 0934   ALKPHOS 94 11/14/2016 0824   AST 26 06/24/2023 0934   AST 28 11/14/2016 0824   ALT 26 06/24/2023 0934   ALT 24 11/14/2016 0824   BILITOT 0.6 06/24/2023 0934   BILITOT 0.41 11/14/2016 0824       RADIOGRAPHIC STUDIES:  NM PET Image Restage (PS) Whole Body Result Date: 06/28/2023 CLINICAL DATA:  Subsequent treatment strategy for multiple myeloma. EXAM: NUCLEAR MEDICINE PET WHOLE BODY TECHNIQUE: 12.75 mCi F-18 FDG was injected intravenously. Full-ring PET imaging was performed from the head to foot after the radiotracer. CT data was obtained and used for attenuation correction and anatomic localization. Fasting blood glucose: 144 mg/dl COMPARISON:  PET-CT 97/70/7975. FINDINGS: Mediastinal blood pool activity: SUV max 2.7 HEAD/NECK: No specific  abnormal uptake identified including along lymph node change of the submandibular, posterior triangle or internal jugular region. Near symmetric uptake of the intracranial compartment. Incidental CT findings: The parotid glands, submandibular glands and thyroid  gland are grossly unremarkable. Thyroid  gland is somewhat small. Streak artifact related to the patient's dental hardware. Grossly preserved brain volume and gray-white matter differentiation. No midline shift. No hydrocephalus. Slight mucosal thickening of the right maxillary paranasal sinus. Mastoid air cells are clear. CHEST: No specific abnormal uptake seen above blood pool in the axillary regions, hilum or mediastinum. No abnormal lung uptake. Incidental CT findings: Right IJ chest port in place with tip of the catheter extending to the right atrium. Heart is nonenlarged. No pericardial effusion. Coronary calcifications are seen. The thoracic aorta has a normal course and caliber. Normal caliber thoracic esophagus. Breathing motion identified. Linear opacity seen along bases likely scar or atelectasis. Small air cyst along the right lower lobe. No pleural effusion or pneumothorax identified. ABDOMEN/PELVIS: There is physiologic distribution radiotracer along the parenchymal organs, bowel and renal collecting systems. No abnormal nodal uptake. Incidental CT findings: Hepatosplenomegaly identified. There is a nodular contour to liver. No ascites. Previous cholecystectomy. Mild global pancreatic parenchymal atrophy. Adrenal glands are grossly preserved. No abnormal calcifications seen within either kidney nor along the course of either ureter. Underdistended urinary bladder. The uterus is present. Right adnexal cystic focus again identified measuring 4.7 cm in diameter today and previously 4.5 cm. Likely ovarian. This does not show any abnormal uptake. Skeleton/extremities: No discrete areas of significant hypermetabolism along the skeleton nor along the  soft tissues of the extremities. Incidental  CT findings: Mild soft tissue edema identified along the lower extremities. Bilateral knee joint effusions are identified, left greater than right. Degenerative changes are noted. Scattered degenerative changes elsewhere as well. Right hemilaminectomy changes along the lower lumbar spine. IMPRESSION: No specific areas of abnormal radiotracer uptake identified along the skeleton nor lymph nodes. Hepatosplenomegaly identified. Nodular appearance to the liver as well. Please correlate for history of chronic liver disease. Stable right adnexal, likely ovarian cyst without uptake. Please correlate with prior workup and additional surveillance is recommended. Scattered degenerative changes. Bilateral knee joint effusions, left greater than right. Please correlate with history. Electronically Signed   By: Ranell Bring M.D.   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 Her myeloma panel showed continuous increase in her free lambda light chain.    The patient previously underwent 107 cycles of subcutaneous weekly Velcade , as well as Revlimid  and Decadron  on and off over the last several years.   She had been off treatment since June 2021 until July 2022 in which she had evidence of disease progression.  Therefore, she was restarted on Velcade , Revlimid , and Decadron .  She has restarted treatment in July 2022 with cycle 107 of weekly velcade . She tolerated well without any concerning adverse side effects.  She was then given a break.   She was seen recently by Dr. Nichole at [facility] for second opinion and after extensive investigation is recommended for her to resume her treatment for the multiple myeloma with daratumumab , Pomalyst  3 Mg p.o. daily for 21 days every 4 weeks and Decadron  20 mg weekly. This was started in September 2024.  This was discontinued due to disease progression in December 2024.    She saw multiple myeloma expert, Dr. Ariel recently who recommended Selinexor  (oral), carfilzomib  (infusion), and Decadron  was recommended. She started this on 02/25/23.    She saw Dr. Mick on 04/25/2023.   She has been experiencing dose delays due to thrombocytopenia.  She underwent T cell harvesting on 07/10/23.     Her labs today show thrombocytopenia with platelet count of 73k and WBC 3.6 and ANC 2.7. Hbg 8.2. Per chart review from 3/24 and 3/31, her platelet count was 72k at that time and she proceeded with treatment at that time. We recommend proceeding with treatment tomorrow as scheduled. She knows to report any concerning abnormal bleeding/bruising.    Anemia Chronic anemia with hemoglobin at 8.2 g/dL - Monitor hemoglobin levels closely. - Draw extra tube of blood weekly for monitoring.    She will continue taking her iron supplement.    She will see Dr. Sherrod on 07/31/23.    Peripheral edema Mild peripheral edema likely due to recent activity and lack of compression sock use. Conservative management preferred. - Advise use of compression socks when on feet for extended periods. - Monitor edema for any increase in severity.  - Reduce salt intake. - Consider Lasix if edema worsens and becomes more severe.   The patient was advised to call immediately if she has any concerning symptoms in the interval. The patient voices understanding of current disease status and treatment options and is in agreement with the current care plan. All questions were answered. The patient knows to call the clinic with any problems, questions or concerns. We can certainly see the patient much sooner if necessary    No orders of the defined types were placed  in this encounter.    The total time spent in the appointment was 20-29 minutes  Nalini Alcaraz L Knute Mazzuca, PA-C 07/14/23

## 2023-07-15 ENCOUNTER — Other Ambulatory Visit

## 2023-07-15 ENCOUNTER — Ambulatory Visit

## 2023-07-15 ENCOUNTER — Ambulatory Visit: Admitting: Internal Medicine

## 2023-07-16 ENCOUNTER — Inpatient Hospital Stay: Attending: Internal Medicine | Admitting: Physician Assistant

## 2023-07-16 ENCOUNTER — Inpatient Hospital Stay: Attending: Internal Medicine

## 2023-07-16 VITALS — BP 148/64 | HR 66 | Temp 98.2°F | Resp 14 | Wt 262.7 lb

## 2023-07-16 DIAGNOSIS — Z79899 Other long term (current) drug therapy: Secondary | ICD-10-CM | POA: Insufficient documentation

## 2023-07-16 DIAGNOSIS — R600 Localized edema: Secondary | ICD-10-CM | POA: Insufficient documentation

## 2023-07-16 DIAGNOSIS — D649 Anemia, unspecified: Secondary | ICD-10-CM | POA: Diagnosis not present

## 2023-07-16 DIAGNOSIS — Z880 Allergy status to penicillin: Secondary | ICD-10-CM | POA: Diagnosis not present

## 2023-07-16 DIAGNOSIS — D701 Agranulocytosis secondary to cancer chemotherapy: Secondary | ICD-10-CM

## 2023-07-16 DIAGNOSIS — Z7969 Long term (current) use of other immunomodulators and immunosuppressants: Secondary | ICD-10-CM | POA: Diagnosis not present

## 2023-07-16 DIAGNOSIS — M25471 Effusion, right ankle: Secondary | ICD-10-CM | POA: Diagnosis not present

## 2023-07-16 DIAGNOSIS — Z5111 Encounter for antineoplastic chemotherapy: Secondary | ICD-10-CM | POA: Diagnosis not present

## 2023-07-16 DIAGNOSIS — R5383 Other fatigue: Secondary | ICD-10-CM | POA: Insufficient documentation

## 2023-07-16 DIAGNOSIS — Z5112 Encounter for antineoplastic immunotherapy: Secondary | ICD-10-CM | POA: Insufficient documentation

## 2023-07-16 DIAGNOSIS — C9000 Multiple myeloma not having achieved remission: Secondary | ICD-10-CM | POA: Diagnosis not present

## 2023-07-16 DIAGNOSIS — Z885 Allergy status to narcotic agent status: Secondary | ICD-10-CM | POA: Diagnosis not present

## 2023-07-16 DIAGNOSIS — M7989 Other specified soft tissue disorders: Secondary | ICD-10-CM | POA: Diagnosis not present

## 2023-07-16 DIAGNOSIS — D696 Thrombocytopenia, unspecified: Secondary | ICD-10-CM | POA: Diagnosis not present

## 2023-07-16 DIAGNOSIS — R197 Diarrhea, unspecified: Secondary | ICD-10-CM | POA: Insufficient documentation

## 2023-07-16 DIAGNOSIS — Z9049 Acquired absence of other specified parts of digestive tract: Secondary | ICD-10-CM | POA: Insufficient documentation

## 2023-07-16 DIAGNOSIS — Z7961 Long term (current) use of immunomodulator: Secondary | ICD-10-CM | POA: Insufficient documentation

## 2023-07-16 DIAGNOSIS — Z7982 Long term (current) use of aspirin: Secondary | ICD-10-CM | POA: Insufficient documentation

## 2023-07-16 DIAGNOSIS — D72819 Decreased white blood cell count, unspecified: Secondary | ICD-10-CM | POA: Insufficient documentation

## 2023-07-16 DIAGNOSIS — Z79624 Long term (current) use of inhibitors of nucleotide synthesis: Secondary | ICD-10-CM | POA: Diagnosis not present

## 2023-07-16 DIAGNOSIS — M25472 Effusion, left ankle: Secondary | ICD-10-CM | POA: Insufficient documentation

## 2023-07-16 DIAGNOSIS — Z95828 Presence of other vascular implants and grafts: Secondary | ICD-10-CM

## 2023-07-16 DIAGNOSIS — Z7952 Long term (current) use of systemic steroids: Secondary | ICD-10-CM | POA: Diagnosis not present

## 2023-07-16 LAB — CBC WITH DIFFERENTIAL (CANCER CENTER ONLY)
Abs Immature Granulocytes: 0.01 K/uL (ref 0.00–0.07)
Basophils Absolute: 0 K/uL (ref 0.0–0.1)
Basophils Relative: 0 %
Eosinophils Absolute: 0 K/uL (ref 0.0–0.5)
Eosinophils Relative: 1 %
HCT: 25.6 % — ABNORMAL LOW (ref 36.0–46.0)
Hemoglobin: 8.2 g/dL — ABNORMAL LOW (ref 12.0–15.0)
Immature Granulocytes: 0 %
Lymphocytes Relative: 15 %
Lymphs Abs: 0.5 K/uL — ABNORMAL LOW (ref 0.7–4.0)
MCH: 27.4 pg (ref 26.0–34.0)
MCHC: 32 g/dL (ref 30.0–36.0)
MCV: 85.6 fL (ref 80.0–100.0)
Monocytes Absolute: 0.3 K/uL (ref 0.1–1.0)
Monocytes Relative: 8 %
Neutro Abs: 2.7 K/uL (ref 1.7–7.7)
Neutrophils Relative %: 76 %
Platelet Count: 73 K/uL — ABNORMAL LOW (ref 150–400)
RBC: 2.99 MIL/uL — ABNORMAL LOW (ref 3.87–5.11)
RDW: 15.2 % (ref 11.5–15.5)
WBC Count: 3.6 K/uL — ABNORMAL LOW (ref 4.0–10.5)
nRBC: 0 % (ref 0.0–0.2)

## 2023-07-16 LAB — CMP (CANCER CENTER ONLY)
ALT: 16 U/L (ref 0–44)
AST: 24 U/L (ref 15–41)
Albumin: 4 g/dL (ref 3.5–5.0)
Alkaline Phosphatase: 132 U/L — ABNORMAL HIGH (ref 38–126)
Anion gap: 6 (ref 5–15)
BUN: 8 mg/dL (ref 8–23)
CO2: 27 mmol/L (ref 22–32)
Calcium: 8.9 mg/dL (ref 8.9–10.3)
Chloride: 107 mmol/L (ref 98–111)
Creatinine: 0.77 mg/dL (ref 0.44–1.00)
GFR, Estimated: >60 mL/min (ref >60)
Glucose, Bld: 142 mg/dL — ABNORMAL HIGH (ref 70–99)
Potassium: 3.5 mmol/L (ref 3.5–5.1)
Sodium: 140 mmol/L (ref 135–145)
Total Bilirubin: 0.7 mg/dL (ref 0.0–1.2)
Total Protein: 6.1 g/dL — ABNORMAL LOW (ref 6.5–8.1)

## 2023-07-16 LAB — SAMPLE TO BLOOD BANK

## 2023-07-16 MED ORDER — SODIUM CHLORIDE 0.9% FLUSH
10.0000 mL | INTRAVENOUS | Status: DC | PRN
  Administered 2023-07-16: 10 mL via INTRAVENOUS

## 2023-07-16 MED ORDER — HEPARIN SOD (PORK) LOCK FLUSH 100 UNIT/ML IV SOLN
500.0000 [IU] | Freq: Once | INTRAVENOUS | Status: AC
  Administered 2023-07-16: 500 [IU] via INTRAVENOUS

## 2023-07-17 ENCOUNTER — Inpatient Hospital Stay

## 2023-07-17 VITALS — BP 139/54 | HR 70 | Temp 98.5°F | Resp 20

## 2023-07-17 DIAGNOSIS — C9000 Multiple myeloma not having achieved remission: Secondary | ICD-10-CM

## 2023-07-17 DIAGNOSIS — Z5112 Encounter for antineoplastic immunotherapy: Secondary | ICD-10-CM | POA: Diagnosis not present

## 2023-07-17 MED ORDER — DEXTROSE 5 % IV SOLN
56.0000 mg/m2 | Freq: Once | INTRAVENOUS | Status: AC
  Administered 2023-07-17: 120 mg via INTRAVENOUS
  Filled 2023-07-17: qty 60

## 2023-07-17 MED ORDER — SODIUM CHLORIDE 0.9 % IV SOLN
INTRAVENOUS | Status: DC
Start: 2023-07-17 — End: 2023-07-17

## 2023-07-17 MED ORDER — HEPARIN SOD (PORK) LOCK FLUSH 100 UNIT/ML IV SOLN
500.0000 [IU] | Freq: Once | INTRAVENOUS | Status: AC | PRN
  Administered 2023-07-17: 500 [IU]

## 2023-07-17 MED ORDER — SODIUM CHLORIDE 0.9 % IV SOLN
Freq: Once | INTRAVENOUS | Status: AC
Start: 2023-07-17 — End: 2023-07-17

## 2023-07-17 MED ORDER — SODIUM CHLORIDE 0.9% FLUSH
10.0000 mL | INTRAVENOUS | Status: DC | PRN
  Administered 2023-07-17: 10 mL

## 2023-07-20 ENCOUNTER — Other Ambulatory Visit: Payer: Self-pay

## 2023-07-22 ENCOUNTER — Other Ambulatory Visit

## 2023-07-22 ENCOUNTER — Ambulatory Visit

## 2023-07-22 ENCOUNTER — Ambulatory Visit: Admitting: Physician Assistant

## 2023-07-24 ENCOUNTER — Inpatient Hospital Stay

## 2023-07-24 ENCOUNTER — Encounter: Payer: Self-pay | Admitting: Internal Medicine

## 2023-07-24 ENCOUNTER — Other Ambulatory Visit

## 2023-07-24 ENCOUNTER — Other Ambulatory Visit: Payer: Self-pay | Admitting: Medical Oncology

## 2023-07-24 ENCOUNTER — Ambulatory Visit

## 2023-07-24 VITALS — BP 133/56 | HR 75 | Temp 98.3°F | Resp 16 | Wt 264.8 lb

## 2023-07-24 DIAGNOSIS — Z5112 Encounter for antineoplastic immunotherapy: Secondary | ICD-10-CM | POA: Diagnosis not present

## 2023-07-24 DIAGNOSIS — C9000 Multiple myeloma not having achieved remission: Secondary | ICD-10-CM

## 2023-07-24 DIAGNOSIS — D649 Anemia, unspecified: Secondary | ICD-10-CM

## 2023-07-24 DIAGNOSIS — T451X5A Adverse effect of antineoplastic and immunosuppressive drugs, initial encounter: Secondary | ICD-10-CM

## 2023-07-24 DIAGNOSIS — Z95828 Presence of other vascular implants and grafts: Secondary | ICD-10-CM

## 2023-07-24 LAB — CBC WITH DIFFERENTIAL (CANCER CENTER ONLY)
Abs Immature Granulocytes: 0.01 K/uL (ref 0.00–0.07)
Basophils Absolute: 0 K/uL (ref 0.0–0.1)
Basophils Relative: 1 %
Eosinophils Absolute: 0 K/uL (ref 0.0–0.5)
Eosinophils Relative: 1 %
HCT: 25.2 % — ABNORMAL LOW (ref 36.0–46.0)
Hemoglobin: 7.9 g/dL — ABNORMAL LOW (ref 12.0–15.0)
Immature Granulocytes: 1 %
Lymphocytes Relative: 25 %
Lymphs Abs: 0.5 K/uL — ABNORMAL LOW (ref 0.7–4.0)
MCH: 27 pg (ref 26.0–34.0)
MCHC: 31.3 g/dL (ref 30.0–36.0)
MCV: 86 fL (ref 80.0–100.0)
Monocytes Absolute: 0.2 K/uL (ref 0.1–1.0)
Monocytes Relative: 12 %
Neutro Abs: 1.3 K/uL — ABNORMAL LOW (ref 1.7–7.7)
Neutrophils Relative %: 60 %
Platelet Count: 72 K/uL — ABNORMAL LOW (ref 150–400)
RBC: 2.93 MIL/uL — ABNORMAL LOW (ref 3.87–5.11)
RDW: 15.7 % — ABNORMAL HIGH (ref 11.5–15.5)
WBC Count: 2 K/uL — ABNORMAL LOW (ref 4.0–10.5)
nRBC: 0 % (ref 0.0–0.2)

## 2023-07-24 LAB — PREPARE RBC (CROSSMATCH)

## 2023-07-24 LAB — CMP (CANCER CENTER ONLY)
ALT: 16 U/L (ref 0–44)
AST: 22 U/L (ref 15–41)
Albumin: 3.9 g/dL (ref 3.5–5.0)
Alkaline Phosphatase: 111 U/L (ref 38–126)
Anion gap: 5 (ref 5–15)
BUN: 8 mg/dL (ref 8–23)
CO2: 27 mmol/L (ref 22–32)
Calcium: 8.5 mg/dL — ABNORMAL LOW (ref 8.9–10.3)
Chloride: 109 mmol/L (ref 98–111)
Creatinine: 0.66 mg/dL (ref 0.44–1.00)
GFR, Estimated: >60 mL/min (ref >60)
Glucose, Bld: 94 mg/dL (ref 70–99)
Potassium: 4.1 mmol/L (ref 3.5–5.1)
Sodium: 141 mmol/L (ref 135–145)
Total Bilirubin: 0.7 mg/dL (ref 0.0–1.2)
Total Protein: 6.1 g/dL — ABNORMAL LOW (ref 6.5–8.1)

## 2023-07-24 LAB — SAMPLE TO BLOOD BANK

## 2023-07-24 MED ORDER — SODIUM CHLORIDE 0.9% FLUSH
10.0000 mL | INTRAVENOUS | Status: DC | PRN
  Administered 2023-07-24: 10 mL via INTRAVENOUS

## 2023-07-24 MED ORDER — HEPARIN SOD (PORK) LOCK FLUSH 100 UNIT/ML IV SOLN
500.0000 [IU] | Freq: Once | INTRAVENOUS | Status: AC | PRN
  Administered 2023-07-24: 500 [IU]

## 2023-07-24 MED ORDER — SODIUM CHLORIDE 0.9 % IV SOLN
Freq: Once | INTRAVENOUS | Status: AC
Start: 2023-07-24 — End: 2023-07-24

## 2023-07-24 MED ORDER — ACETAMINOPHEN 325 MG PO TABS
650.0000 mg | ORAL_TABLET | Freq: Once | ORAL | Status: AC
  Administered 2023-07-24: 650 mg via ORAL
  Filled 2023-07-24: qty 2

## 2023-07-24 MED ORDER — SODIUM CHLORIDE 0.9% IV SOLUTION
250.0000 mL | Freq: Once | INTRAVENOUS | Status: AC
  Administered 2023-07-24: 250 mL via INTRAVENOUS

## 2023-07-24 MED ORDER — DIPHENHYDRAMINE HCL 25 MG PO CAPS
25.0000 mg | ORAL_CAPSULE | Freq: Once | ORAL | Status: AC
  Administered 2023-07-24: 25 mg via ORAL
  Filled 2023-07-24: qty 1

## 2023-07-24 MED ORDER — SODIUM CHLORIDE 0.9% FLUSH
10.0000 mL | INTRAVENOUS | Status: DC | PRN
  Administered 2023-07-24: 10 mL

## 2023-07-24 MED ORDER — DEXTROSE 5 % IV SOLN
56.0000 mg/m2 | Freq: Once | INTRAVENOUS | Status: AC
  Administered 2023-07-24: 120 mg via INTRAVENOUS
  Filled 2023-07-24: qty 60

## 2023-07-24 MED ORDER — SODIUM CHLORIDE 0.9 % IV SOLN: INTRAVENOUS | Status: DC

## 2023-07-24 NOTE — Progress Notes (Signed)
 Blood transfusion orders entered and released.

## 2023-07-24 NOTE — Progress Notes (Signed)
 Patient to receive one unit of blood today for her hemoglobin of 7.9 per Dr. Jeannett orders.

## 2023-07-24 NOTE — Patient Instructions (Signed)
 [facility] - A DEPT OF [facility]  Discharge Instructions: Thank you for choosing [facility] to provide your oncology and hematology care.   If you have a lab appointment with the Cancer Center, please go directly to the Cancer Center and check in at the registration area.   Wear comfortable clothing and clothing appropriate for easy access to any Portacath or PICC line.   We strive to give you quality time with your provider. You may need to reschedule your appointment if you arrive late (15 or more minutes).  Arriving late affects you and other patients whose appointments are after yours.  Also, if you miss three or more appointments without notifying the office, you may be dismissed from the clinic at the provider's discretion.      For prescription refill requests, have your pharmacy contact our office and allow 72 hours for refills to be completed.    Today you received the following chemotherapy and/or immunotherapy agents: Kyprolis        To help prevent nausea and vomiting after your treatment, we encourage you to take your nausea medication as directed.  BELOW ARE SYMPTOMS THAT SHOULD BE REPORTED IMMEDIATELY: *FEVER GREATER THAN 100.4 F (38 C) OR HIGHER *CHILLS OR SWEATING *NAUSEA AND VOMITING THAT IS NOT CONTROLLED WITH YOUR NAUSEA MEDICATION *UNUSUAL SHORTNESS OF BREATH *UNUSUAL BRUISING OR BLEEDING *URINARY PROBLEMS (pain or burning when urinating, or frequent urination) *BOWEL PROBLEMS (unusual diarrhea, constipation, pain near the anus) TENDERNESS IN MOUTH AND THROAT WITH OR WITHOUT PRESENCE OF ULCERS (sore throat, sores in mouth, or a toothache) UNUSUAL RASH, SWELLING OR PAIN  UNUSUAL VAGINAL DISCHARGE OR ITCHING   Items with * indicate a potential emergency and should be followed up as soon as possible or go to the Emergency Department if any problems should occur.  Please show the CHEMOTHERAPY ALERT CARD or IMMUNOTHERAPY  ALERT CARD at check-in to the Emergency Department and triage nurse.  Should you have questions after your visit or need to cancel or reschedule your appointment, please contact [facility] - A DEPT OF [facility]  Dept: [phone]  and follow the prompts.  Office hours are 8:00 a.m. to 4:30 p.m. Monday - Friday. Please note that voicemails left after 4:00 p.m. may not be returned until the following business day.  We are closed weekends and major holidays. You have access to a nurse at all times for urgent questions. Please call the main number to the clinic Dept: [phone] and follow the prompts.   For any non-urgent questions, you may also contact your provider using MyChart. We now offer e-Visits for anyone 74 and older to request care online for non-urgent symptoms. For details visit [email or web address].   Also download the MyChart app! Go to the app store, search MyChart, open the app, select [place], and log in with your MyChart username and password.  Blood Transfusion, Adult, Care After The following information offers guidance on how to care for yourself after your procedure. Your health care provider may also give you more specific instructions. If you have problems or questions, contact your health care provider. What can I expect after the procedure? After the procedure, it is common to have: Bruising and soreness where the IV was inserted. A headache. Follow these instructions at home: IV insertion site care     Follow instructions from your health care provider about how to take care  of your IV insertion site. Make sure you: Wash your hands with soap and water for at least 20 seconds before and after you change your bandage (dressing). If soap and water are not available, use hand sanitizer. Change your dressing as told by your health care provider. Check your IV insertion site every day for signs of infection. Check for: Redness,  swelling, or pain. Bleeding from the site. Warmth. Pus or a bad smell. General instructions Take over-the-counter and prescription medicines only as told by your health care provider. Rest as told by your health care provider. Return to your normal activities as told by your health care provider. Keep all follow-up visits. Lab tests may need to be done at certain periods to recheck your blood counts. Contact a health care provider if: You have itching or red, swollen areas of skin (hives). You have a fever or chills. You have pain in the head, back, or chest. You feel anxious or you feel weak after doing your normal activities. You have redness, swelling, warmth, or pain around the IV insertion site. You have blood coming from the IV insertion site that does not stop with pressure. You have pus or a bad smell coming from your IV insertion site. If you received your blood transfusion in an outpatient setting, you will be told whom to contact to report any reactions. Get help right away if: You have symptoms of a serious allergic or immune system reaction, including: Trouble breathing or shortness of breath. Swelling of the face, feeling flushed, or widespread rash. Dark urine or blood in the urine. Fast heartbeat. These symptoms may be an emergency. Get help right away. Call 911. Do not wait to see if the symptoms will go away. Do not drive yourself to the hospital. Summary Bruising and soreness around the IV insertion site are common. Check your IV insertion site every day for signs of infection. Rest as told by your health care provider. Return to your normal activities as told by your health care provider. Get help right away for symptoms of a serious allergic or immune system reaction to the blood transfusion. This information is not intended to replace advice given to you by your health care provider. Make sure you discuss any questions you have with your health care  provider. Document Revised: 03/24/2021 Document Reviewed: 03/24/2021 Elsevier Patient Education  2024 [employer].

## 2023-07-24 NOTE — Progress Notes (Signed)
 CAR-T-Brenda Conner said that Dr. Nichole told her that wants her to get 2 more doses of Kyprolis  . She  is scheduled to start CART 08/14/2023.

## 2023-07-25 ENCOUNTER — Telehealth: Payer: Self-pay

## 2023-07-25 ENCOUNTER — Encounter: Payer: Self-pay | Admitting: Internal Medicine

## 2023-07-25 LAB — TYPE AND SCREEN
ABO/RH(D): B NEG
Antibody Screen: NEGATIVE
Unit division: 0

## 2023-07-25 LAB — BPAM RBC
Blood Product Expiration Date: 202507302359
ISSUE DATE / TIME: 202507161238
Unit Type and Rh: 1700

## 2023-07-25 NOTE — Telephone Encounter (Signed)
 Spoke with patient regarding upcoming appointments. Patient stated she is scheduled to receive one more treatment of Kyprolis  on 07/31/23 prior to beginning T Cell therapy. She shared that she will be staying in an Airbnb in [place] for the months of August and September while receiving therapy at [facility], and her sister will be staying with her for support. Patient also reported that the last week of July will be her final dose of Xpovio  until after T Cell therapy.   August appointments have been canceled. Instructed patient to call with any concerns.

## 2023-07-29 ENCOUNTER — Other Ambulatory Visit

## 2023-07-29 ENCOUNTER — Encounter (HOSPITAL_BASED_OUTPATIENT_CLINIC_OR_DEPARTMENT_OTHER): Payer: Self-pay | Admitting: Obstetrics & Gynecology

## 2023-07-29 ENCOUNTER — Other Ambulatory Visit (HOSPITAL_BASED_OUTPATIENT_CLINIC_OR_DEPARTMENT_OTHER): Payer: Self-pay | Admitting: Obstetrics & Gynecology

## 2023-07-29 ENCOUNTER — Ambulatory Visit

## 2023-07-29 DIAGNOSIS — F419 Anxiety disorder, unspecified: Secondary | ICD-10-CM

## 2023-07-29 MED ORDER — ALPRAZOLAM 0.5 MG PO TABS: 0.5000 mg | ORAL_TABLET | Freq: Every evening | ORAL | 1 refills | Status: DC | PRN

## 2023-07-30 ENCOUNTER — Other Ambulatory Visit: Payer: Self-pay | Admitting: Physician Assistant

## 2023-07-30 DIAGNOSIS — C9000 Multiple myeloma not having achieved remission: Secondary | ICD-10-CM

## 2023-07-31 ENCOUNTER — Ambulatory Visit

## 2023-07-31 ENCOUNTER — Inpatient Hospital Stay: Admitting: Internal Medicine

## 2023-07-31 ENCOUNTER — Inpatient Hospital Stay

## 2023-07-31 ENCOUNTER — Ambulatory Visit: Admitting: Internal Medicine

## 2023-07-31 ENCOUNTER — Other Ambulatory Visit

## 2023-07-31 VITALS — BP 135/70 | HR 67 | Temp 98.4°F | Resp 16 | Wt 260.0 lb

## 2023-07-31 DIAGNOSIS — C9000 Multiple myeloma not having achieved remission: Secondary | ICD-10-CM | POA: Diagnosis not present

## 2023-07-31 DIAGNOSIS — Z5112 Encounter for antineoplastic immunotherapy: Secondary | ICD-10-CM | POA: Diagnosis not present

## 2023-07-31 DIAGNOSIS — Z95828 Presence of other vascular implants and grafts: Secondary | ICD-10-CM

## 2023-07-31 DIAGNOSIS — D701 Agranulocytosis secondary to cancer chemotherapy: Secondary | ICD-10-CM

## 2023-07-31 LAB — CBC WITH DIFFERENTIAL (CANCER CENTER ONLY)
Abs Immature Granulocytes: 0.01 K/uL (ref 0.00–0.07)
Basophils Absolute: 0 K/uL (ref 0.0–0.1)
Basophils Relative: 0 %
Eosinophils Absolute: 0 K/uL (ref 0.0–0.5)
Eosinophils Relative: 1 %
HCT: 28.8 % — ABNORMAL LOW (ref 36.0–46.0)
Hemoglobin: 9.1 g/dL — ABNORMAL LOW (ref 12.0–15.0)
Immature Granulocytes: 0 %
Lymphocytes Relative: 25 %
Lymphs Abs: 0.7 K/uL (ref 0.7–4.0)
MCH: 27.1 pg (ref 26.0–34.0)
MCHC: 31.6 g/dL (ref 30.0–36.0)
MCV: 85.7 fL (ref 80.0–100.0)
Monocytes Absolute: 0.2 K/uL (ref 0.1–1.0)
Monocytes Relative: 9 %
Neutro Abs: 1.7 K/uL (ref 1.7–7.7)
Neutrophils Relative %: 65 %
Platelet Count: 74 K/uL — ABNORMAL LOW (ref 150–400)
RBC: 3.36 MIL/uL — ABNORMAL LOW (ref 3.87–5.11)
RDW: 15.9 % — ABNORMAL HIGH (ref 11.5–15.5)
WBC Count: 2.6 K/uL — ABNORMAL LOW (ref 4.0–10.5)
nRBC: 0 % (ref 0.0–0.2)

## 2023-07-31 LAB — CMP (CANCER CENTER ONLY)
ALT: 17 U/L (ref 0–44)
AST: 28 U/L (ref 15–41)
Albumin: 4.1 g/dL (ref 3.5–5.0)
Alkaline Phosphatase: 140 U/L — ABNORMAL HIGH (ref 38–126)
Anion gap: 7 (ref 5–15)
BUN: 13 mg/dL (ref 8–23)
CO2: 26 mmol/L (ref 22–32)
Calcium: 8.9 mg/dL (ref 8.9–10.3)
Chloride: 107 mmol/L (ref 98–111)
Creatinine: 0.82 mg/dL (ref 0.44–1.00)
GFR, Estimated: >60 mL/min (ref >60)
Glucose, Bld: 115 mg/dL — ABNORMAL HIGH (ref 70–99)
Potassium: 4.1 mmol/L (ref 3.5–5.1)
Sodium: 140 mmol/L (ref 135–145)
Total Bilirubin: 0.9 mg/dL (ref 0.0–1.2)
Total Protein: 6.4 g/dL — ABNORMAL LOW (ref 6.5–8.1)

## 2023-07-31 LAB — SAMPLE TO BLOOD BANK

## 2023-07-31 MED ORDER — DEXTROSE 5 % IV SOLN
56.0000 mg/m2 | Freq: Once | INTRAVENOUS | Status: AC
  Administered 2023-07-31: 120 mg via INTRAVENOUS
  Filled 2023-07-31: qty 60

## 2023-07-31 MED ORDER — SODIUM CHLORIDE 0.9 % IV SOLN
Freq: Once | INTRAVENOUS | Status: AC
Start: 2023-07-31 — End: 2023-07-31

## 2023-07-31 MED ORDER — SODIUM CHLORIDE 0.9% FLUSH
10.0000 mL | INTRAVENOUS | Status: DC | PRN
  Administered 2023-07-31: 10 mL via INTRAVENOUS

## 2023-07-31 NOTE — Patient Instructions (Signed)
 [facility] - A DEPT OF [facility]  Discharge Instructions: Thank you for choosing [facility] to provide your oncology and hematology care.   If you have a lab appointment with the Cancer Center, please go directly to the Cancer Center and check in at the registration area.   Wear comfortable clothing and clothing appropriate for easy access to any Portacath or PICC line.   We strive to give you quality time with your provider. You may need to reschedule your appointment if you arrive late (15 or more minutes).  Arriving late affects you and other patients whose appointments are after yours.  Also, if you miss three or more appointments without notifying the office, you may be dismissed from the clinic at the provider's discretion.      For prescription refill requests, have your pharmacy contact our office and allow 72 hours for refills to be completed.    Today you received the following chemotherapy and/or immunotherapy agents kyprolis      To help prevent nausea and vomiting after your treatment, we encourage you to take your nausea medication as directed.  BELOW ARE SYMPTOMS THAT SHOULD BE REPORTED IMMEDIATELY: *FEVER GREATER THAN 100.4 F (38 C) OR HIGHER *CHILLS OR SWEATING *NAUSEA AND VOMITING THAT IS NOT CONTROLLED WITH YOUR NAUSEA MEDICATION *UNUSUAL SHORTNESS OF BREATH *UNUSUAL BRUISING OR BLEEDING *URINARY PROBLEMS (pain or burning when urinating, or frequent urination) *BOWEL PROBLEMS (unusual diarrhea, constipation, pain near the anus) TENDERNESS IN MOUTH AND THROAT WITH OR WITHOUT PRESENCE OF ULCERS (sore throat, sores in mouth, or a toothache) UNUSUAL RASH, SWELLING OR PAIN  UNUSUAL VAGINAL DISCHARGE OR ITCHING   Items with * indicate a potential emergency and should be followed up as soon as possible or go to the Emergency Department if any problems should occur.  Please show the CHEMOTHERAPY ALERT CARD or IMMUNOTHERAPY  ALERT CARD at check-in to the Emergency Department and triage nurse.  Should you have questions after your visit or need to cancel or reschedule your appointment, please contact [facility] - A DEPT OF [facility]  Dept: [phone]  and follow the prompts.  Office hours are 8:00 a.m. to 4:30 p.m. Monday - Friday. Please note that voicemails left after 4:00 p.m. may not be returned until the following business day.  We are closed weekends and major holidays. You have access to a nurse at all times for urgent questions. Please call the main number to the clinic Dept: [phone] and follow the prompts.   For any non-urgent questions, you may also contact your provider using MyChart. We now offer e-Visits for anyone 80 and older to request care online for non-urgent symptoms. For details visit [email or web address].   Also download the MyChart app! Go to the app store, search "MyChart", open the app, select [facility], and log in with your MyChart username and password.

## 2023-07-31 NOTE — Progress Notes (Signed)
 Per Dr. Sherrod, ok to treat with low platelets  Pt states she took decadron  prior to arrival

## 2023-07-31 NOTE — Progress Notes (Signed)
 [facility] [phone]   [phone]  OFFICE PROGRESS NOTE  [name], MD [address] [place] [address]  DIAGNOSIS: Plasma cell dyscrasia initially diagnosed as MGUS in September 2010, with additional symptoms suggestive of POEMS syndrome.   PRIOR THERAPY:  1) Velcade  1.3 MG/M2 subcutaneously with Decadron  40 mg by mouth on a weekly basis. First cycle 11/24/2013. She status post 31 weekly doses of treatment. 2) Velcade  1.3 MG/M2 subcutaneously and weekly basis with Decadron  40 mg by mouth weekly. First dose 02/01/2015. Status post 28 cycles. 3) Revlimid  25 mg by mouth daily for 21 days every 4 weeks with weekly Decadron  20 mg. started in 11/27/2015. Status post 3 cycles discontinued secondary to lack of response. 4) Systemic treatment with Velcade  1.3 MG/KG weekly, Revlimid  25 mg by mouth daily for 21 days every 4 weeks in addition to Decadron  20 mg by mouth weekly. First dose 03/06/2016. Status post 42 cycles.  She has a break off treatment from June 2021 until July 2022. 5) Second line treatment with daratumumab , Pomalyst  3 mg for 21 days every 4 weeks as well as Decadron  20 mg p.o. weekly.  First dose 09/26/2022.  Status post 1 cycle and currently receiving day 15 of cycle #2.  CURRENT THERAPY: Third treatment with selinexor , carfilzomib  and dex  28-day cycle. Carfilzomib  56 mg/m2 IV over 30 minutes on days 1, 8 and 15 (20 mg/m2 with cycle 1, day 1). Escalate to 70 mg/m2 with cycle 2 or 3 as tolerated. Selinexor  60 mg PO on days 1, 8, 15 and 22. Escalate to 80 mg weekly as tolerated. Dex 20 mg PO on days 1, 8, 15 and 22.  She is status post 4 cycle and she is here today for evaluation before day 15 of cycle #5  INTERVAL HISTORY: Brenda Conner 63 y.o. female returns to the clinic today for follow-up visit. Discussed the use of AI scribe software for clinical note transcription with the patient, who gave verbal consent to  proceed.  History of Present Illness Brenda Conner is a 63 year old female with multiple myeloma who presents for evaluation before starting day fifteen of cycle number five of chemotherapy.  She has a history of multiple myeloma, initially diagnosed as MGUS in September 2010. She is currently on her third line of treatment with selinexor , carfilzomib , and Decadron .  She is preparing for CAR T-cell therapy, which is tentatively scheduled for August 6th. She will need to stay in [place] for thirty-nine days during this treatment, and her sister will be her caregiver during this time. She has arranged to stay in a rented condo to accommodate this.  She has experienced new onset of leg swelling over the past two weeks. The cause is unclear.  Recent blood work shows a white blood count of 2.6, an absolute neutrophil count of 1700, and a hemoglobin level of 9.1. Her platelet count is 74,000.     MEDICAL HISTORY: Past Medical History:  Diagnosis Date   Acid reflux    Anemia    Anxiety    Asthma    Cancer (HCC)    waldenstroms/ macroglobinulemia   Depression    Depression    Diabetes mellitus without complication (HCC)    Dysuria 02/28/2016   Gallstones    Heart palpitations    Hypercholesteremia    Hypertension    Hypothyroidism    Macroglobulinemia    ? POEMS  syndrome   Monoclonal gammopathy of unknown significance (MGUS)    Multiple myeloma (HCC)    Obesity    POEMS syndrome    PONV (postoperative nausea and vomiting)    Sleep apnea    CPAP at bedtime    ALLERGIES:  is allergic to codeine, hydrocodone , lortab [hydrocodone -acetaminophen ], onion, shellfish allergy, and amoxicillin.  MEDICATIONS:  Current Outpatient Medications  Medication Sig Dispense Refill   ACCU-CHEK AVIVA PLUS test strip USE TO CHECK SUGAR TWICE A DAY 90     acyclovir  (ZOVIRAX ) 400 MG tablet Take 1 tablet (400 mg total) by mouth daily. 30 tablet 3   ALPRAZolam  (XANAX ) 0.5 MG tablet Take 1 tablet  (0.5 mg total) by mouth at bedtime as needed for anxiety. 30 tablet 1   aspirin 81 MG chewable tablet Chew by mouth daily.     Azelastine  HCl 0.15 % SOLN as needed.     azithromycin  (ZITHROMAX ) 250 MG tablet Use as instructed 6 each 0   B-D UF III MINI PEN NEEDLES 31G X 5 MM MISC      Blood Glucose Monitoring Suppl (ONE TOUCH ULTRA MINI) W/DEVICE KIT See admin instructions. Reported on 01/25/2015  0   cetirizine (ZYRTEC ALLERGY) 10 MG tablet Take 1 tablet by mouth daily.     Continuous Glucose Sensor (FREESTYLE LIBRE 3 SENSOR) MISC USE 1 SENSOR EVERY 14 DAYS     dexamethasone  (DECADRON ) 4 MG tablet Take 5 tablets (20 mg total) by mouth once a week. Take the day after darzalex  faspro. Take with breakfast. 20 tablet 11   dexamethasone  (DECADRON ) 4 MG tablet Take 5 tablets (20mg ) on days 1, 8, 15 and 22 every 28 days 20 tablet 4   DiphenhydrAMINE  HCl (BENADRYL  ALLERGY PO) Take 1 Dose by mouth daily at 6 (six) AM.     fexofenadine (ALLEGRA ALLERGY) 180 MG tablet Take 1 tablet by mouth daily.     Fluticasone-Salmeterol (ADVAIR ) 100-50 MCG/DOSE AEPB Inhale 2 puffs into the lungs every 12 (twelve) hours.     glucosamine-chondroitin 500-400 MG tablet Take 1 tablet by mouth daily.     ibuprofen (ADVIL,MOTRIN) 100 MG tablet Take 100 mg by mouth every 6 (six) hours as needed. Reported on 05/31/2015     Insulin Glargine  (BASAGLAR  KWIKPEN) 100 UNIT/ML Inject 20 Units into the skin at bedtime.     insulin glargine -yfgn (SEMGLEE) 100 UNIT/ML Pen Inject into the skin 2 (two) times daily. Lantus was out of stock, replacement     levothyroxine (SYNTHROID ) 137 MCG tablet Take 137 mcg by mouth daily.     lidocaine -prilocaine  (EMLA ) cream Apply 1 Application topically as needed. 30 g 2   lisinopril  (PRINIVIL ,ZESTRIL ) 10 MG tablet Take 10 mg by mouth daily. auth number 05/29/2018 [id number]  3   loperamide (IMODIUM) 2 MG capsule Take 2 mg by mouth as needed for diarrhea or loose stools.     magic mouthwash w/lidocaine  SOLN  Take 5 mLs by mouth 4 (four) times daily as needed for mouth pain. Swish, Gargle, and spit 240 mL 1   Melatonin 5 MG CAPS Take 1 capsule by mouth at bedtime.     metFORMIN  (GLUCOPHAGE -XR) 500 MG 24 hr tablet Take 500 mg by mouth daily.     NOVOLOG  FLEXPEN 100 UNIT/ML FlexPen Inject 15 Units into the skin in the morning, at noon, and at bedtime. Per Sliding Scale     OLANZapine  (ZYPREXA ) 5 MG tablet TAKE ONE TABLET THE DAY BEFORE, THE DAY OF, AND THE DAY  AFTER SELINEXOR . 90 tablet 1   omeprazole  (PRILOSEC) 20 MG capsule Take 20 mg by mouth 2 (two) times daily.     ondansetron  (ZOFRAN ) 8 MG tablet Take 1 tablet (8 mg total) by mouth every 8 (eight) hours as needed for nausea or vomiting. 90 tablet 1   pravastatin  (PRAVACHOL ) 80 MG tablet Take 80 mg by mouth daily.     PROAIR  HFA 108 (90 Base) MCG/ACT inhaler Reported on 06/21/2015  1   prochlorperazine  (COMPAZINE ) 10 MG tablet Take 1 tablet (10 mg total) by mouth every 6 (six) hours as needed for nausea or vomiting. 30 tablet 1   selinexor  (XPOVIO , 60 MG ONCE WEEKLY,) Therapy Pack (60 mg once weekly) Take 1 tablet (60 mg total) by mouth once a week. 4 tablet 0   sertraline  (ZOLOFT ) 50 MG tablet TAKE 3 TABLETS BY MOUTH EVERY DAY 270 tablet 1   verapamil  (CALAN -SR) 240 MG CR tablet Take 240 mg by mouth 2 (two) times daily.     No current facility-administered medications for this visit.    SURGICAL HISTORY:  Past Surgical History:  Procedure Laterality Date   ABLATION  09/09/2007   HTA and polyp resection   BONE MARROW BIOPSY  04/08/2012   BONE MARROW BIOPSY  01/2020   CATARACT EXTRACTION, BILATERAL Bilateral 02/2022   FOOT SURGERY Bilateral 01/09/1996   small toe   IR IMAGING GUIDED PORT INSERTION  03/08/2023   KNEE ARTHROSCOPY W/ MENISCAL REPAIR Bilateral 10/10 ; 3/11   LAPAROSCOPIC CHOLECYSTECTOMY  01/09/1995   LUMBAR DISC SURGERY  03/08/2004   herniation, L4-L5   NASAL SINUS SURGERY  01/08/2002   UTERINE FIBROID EMBOLIZATION  2009    HTA and polyp resection    REVIEW OF SYSTEMS:  A comprehensive review of systems was negative except for: Constitutional: positive for fatigue   PHYSICAL EXAMINATION: General appearance: alert, cooperative, fatigued, and no distress Head: Normocephalic, without obvious abnormality, atraumatic Neck: no adenopathy Lymph nodes: Cervical, supraclavicular, and axillary nodes normal. Resp: clear to auscultation bilaterally Back: symmetric, no curvature. ROM normal. No CVA tenderness. Cardio: regular rate and rhythm, S1, S2 normal, no murmur, click, rub or gallop GI: soft, non-tender; bowel sounds normal; no masses,  no organomegaly Extremities: extremities normal, atraumatic, no cyanosis or edema  ECOG PERFORMANCE STATUS: 1 - Symptomatic but completely ambulatory  Blood pressure 135/70, pulse 67, temperature 98.4 F (36.9 C), temperature source Temporal, resp. rate 16, weight 260 lb (117.9 kg), SpO2 100%.  LABORATORY DATA: Lab Results  Component Value Date   WBC 2.6 (L) 07/31/2023   HGB 9.1 (L) 07/31/2023   HCT 28.8 (L) 07/31/2023   MCV 85.7 07/31/2023   PLT 74 (L) 07/31/2023      Chemistry      Component Value Date/Time   NA 141 07/24/2023 0903   NA 138 11/14/2016 0824   K 4.1 07/24/2023 0903   K 4.3 11/14/2016 0824   CL 109 07/24/2023 0903   CL 104 04/07/2012 1415   CO2 27 07/24/2023 0903   CO2 23 11/14/2016 0824   BUN 8 07/24/2023 0903   BUN 9.1 11/14/2016 0824   CREATININE 0.66 07/24/2023 0903   CREATININE 0.7 11/14/2016 0824      Component Value Date/Time   CALCIUM 8.5 (L) 07/24/2023 0903   CALCIUM 9.6 11/14/2016 0824   ALKPHOS 111 07/24/2023 0903   ALKPHOS 94 11/14/2016 0824   AST 22 07/24/2023 0903   AST 28 11/14/2016 0824   ALT 16 07/24/2023 0903  ALT 24 11/14/2016 0824   BILITOT 0.7 07/24/2023 0903   BILITOT 0.41 11/14/2016 0824      ASSESSMENT AND PLAN:  This is a very pleasant 63 years old white female with multiple myeloma with questionable POEMS  syndrome symptom.  The patient has been on treatment with subcutaneous weekly Velcade  as well as Revlimid  and Decadron  status post 34 cycles.  She has been tolerating this treatment well. The patient has been on observation for the last several months and she has been doing fine with no concerning issues except for the recent upper respiratory infection and viral gastroenteritis. Her myeloma panel showed continuous increase in her free lambda light chain. The patient had a bone marrow biopsy and aspirate recently that showed 3-5% plasma cells still suspicious for plasma cell dyscrasia.  The skeletal bone survey was negative for any lytic lesions. The patient has been off treatment for more than a year between June 2021 until July 2022. She had repeat myeloma panel that showed significant worsening and increase of her lambda light chain.  She continues to have anemia but no renal insufficiency or hypercalcemia. She resumed her treatment with Velcade , Revlimid  and Decadron  on July 19, 2020.   The patient is currently on observation and she is feeling fine with no concerning complaints. She was seen recently by Dr. Nichole at [facility] for second opinion and after extensive investigation is recommended for her to resume her treatment for the multiple myeloma with daratumumab , Pomalyst  3 Mg p.o. daily for 21 days every 4 weeks and Decadron  20 mg weekly.  She is status post 2 cycles.  Her treatment was discontinued secondary to disease progression. The patient started third line treatment with selinexor , carfilzomib  as well as Decadron .  She is status post 4 cycles.  She is here today to start day 15 of cycle #5. The patient has been tolerating this treatment fairly well. Assessment and Plan Assessment & Plan Multiple myeloma Multiple myeloma initially diagnosed as MGUS in September 2010. Currently on third-line treatment with selinexor , carfilzomib , and dexamethasone . Preparing for CAR T-cell  therapy, tentatively scheduled for August 6th. Hematological parameters are stable for treatment continuation. - Proceed with day 15 of cycle 5 chemotherapy today - Prepare for CAR T-cell therapy, tentatively scheduled for August 6th - Consider maintenance treatment post-CAR T-cell therapy, potentially including monthly IVIG  Leukopenia White blood count is low at 2.6, but absolute neutrophil count is 1700, which is acceptable for treatment continuation.  Anemia Hemoglobin level is 9.1, which is an improvement and acceptable for treatment continuation.  Peripheral edema New onset of peripheral edema in the legs over the past two weeks. Possible dietary cause suggested, such as salt intake. - Advise reduction of salt intake We will see her after the CAR-T procedure for any additional lab work or treatment locally. She was advised to call immediately if she has any concerning symptoms in the interval. She had repeat myeloma panel performed at [facility] in [place]  that showed significant improvement of her disease on the current regimen.   All questions were answered. The patient knows to call the clinic with any problems, questions or concerns. We can certainly see the patient much sooner if necessary.  Disclaimer: This note was dictated with voice recognition software. Similar sounding words can inadvertently be transcribed and may not be corrected upon review.

## 2023-08-05 ENCOUNTER — Ambulatory Visit: Admitting: Internal Medicine

## 2023-08-05 ENCOUNTER — Ambulatory Visit

## 2023-08-05 ENCOUNTER — Other Ambulatory Visit

## 2023-08-13 ENCOUNTER — Inpatient Hospital Stay

## 2023-08-13 ENCOUNTER — Ambulatory Visit: Admitting: Physician Assistant

## 2023-08-13 ENCOUNTER — Other Ambulatory Visit

## 2023-08-14 ENCOUNTER — Other Ambulatory Visit

## 2023-08-14 ENCOUNTER — Ambulatory Visit

## 2023-08-27 ENCOUNTER — Inpatient Hospital Stay

## 2023-08-27 ENCOUNTER — Inpatient Hospital Stay: Admitting: Internal Medicine

## 2023-09-13 ENCOUNTER — Other Ambulatory Visit: Payer: Self-pay

## 2023-09-19 ENCOUNTER — Telehealth: Payer: Self-pay | Admitting: Medical Oncology

## 2023-09-19 ENCOUNTER — Other Ambulatory Visit: Payer: Self-pay | Admitting: Medical Oncology

## 2023-09-19 DIAGNOSIS — C9000 Multiple myeloma not having achieved remission: Secondary | ICD-10-CM

## 2023-09-19 NOTE — Telephone Encounter (Signed)
 Brenda Conner called and requested to schedule a port flush with  lab appt for one week and f/u with [facility] in 2 weeks and then she will see Dr. Feliberto in 4 weeks.

## 2023-09-19 NOTE — Progress Notes (Signed)
 Department of Hematologic Oncology and Blood Disorders Division of Plasma Cell Disorders  Plasma Cell Diagnosis: Multiple Myeloma   Referred by: Calton Helling Heilingoetter, PA[address],  [place] [address]  Hematology-Oncology: Dr. Sherrod Sherrod   Primary Care Physician: Alm Fairy Heads, MD  Chief Complaint: Follow-up of IgM lambda multiple myeloma with neuropathy s/p cilta-cel 08/21/23  Impression and Plan Brenda Conner is a 63 y.o. female with IgM lambda multiple myeloma with associated neuropathy who most recently received 5th line holding/bridging therapy with Seli-Kd followed by cilta-cel anti-BCMA CAR T cell therapy 08/21/2023. She experienced grade 1 CRS on day +2 (resolved with acetaminophen ) and day +5 (resolved with toci x1). She has not had any ICANS or non-ICANS neurotoxicity.  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  Her serum free lambda light chain level has dropped dramatically, suggestive of an excellent response to treatment.    RECOMMENDATIONS IgG Lambda Multiple Myeloma  -- S/p Flu/Cy [facility] 8/8-8/10/2023 followed by outpatient Cilta-cel infusion 08/21/2023 -- Grade 1 CRS on day +2 (resolved with acetaminophen ) and day +5 (resolved with toci x1). -- D/C keppra.   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 -- The patient received the RSV vaccine 11/2021.  PCV-20 11/2021.   -- The patient has received the Shingrix  vaccine.   -- Continue acyclovir  for zoster reactivation prophylaxis -- Levofloxacin + fluconazole prophylaxis discontinued -- PJP prophylaxis post-Cilta-cel infusion -> IV pentamidine beginning 09/19/23.  Transition to TMP/Sulfa as cytopenias allow.   -- IVIg 0.4  g/kg every 4 weeks beginning 09/19/23.  Transition this supportive care to Dr. Sherrod going forward.    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   -- Parvovirus B19 and CMV testing negative.   -- Maintain active T&S, transfuse per protocol (plan to transfuse at Dr. Jeannett office next week).   -- Received pegfilfrastim 08/23/23 and 09/06/23 for neutropenia -- Low threshold to start romiplostim if cytopenias persist.   -- ANC today is 1.71  Iron Deficiency -- Patient hemoccult + prior to CAR-T cell therapy.   -- Recommend additional work-up for source of GI blood loss locally with repeat EGD / colonoscopy (and possibly capsule study as needed).    AL Amyloid Work-Up -- Work-up for AL amyloidosis negative. -- Hepatology has elected not to pursue a liver biopsy.   -- If she requires carpal tunnel release surgery in the future, we would recommend soft tissue samples to be sent for Congo red staining.    Liver Disease -- Secondary to presumed metabolic dysfunction-associated steatohepatitis  -- Hepatomegaly and splenomegaly on imaging.   -- Amyloidosis work-up negative.  -- Hepatology has elected not to pursue a liver biopsy.    HTN -- HOLD lisinopril  for now, BP running soft  -- Continue Verapamil    Follow-Up -- Transfusion with Dr. Sherrod next week.   -- Weekly labs with Dr. Sherrod (CBC, CMP, CRP, ferritin, fibrinogen ).  -- Follow-up with us  on 10/9 for IVIg and pentamidine.    Thank you for allowing us  to participate in the care of this delightful patient.  Please feel free to contact us  with any questions or concerns.   Maude Bolk, MD Chief, Plasma Cell Disorders Division [facility] [phone] (office) [phone] (  fax) [email or web address]    Interval History 09/19/2023: [name] returns today for routine follow up after recently  receiving cilta-cel. Today is day +29. She reports doing well overall aside from fatigue.  Her energy level is improving.  She has had no fevers.  She has had no headaches, vision changes, speech difficulties, tremor, worsening neuropathy, or focal weakness.  Her balance is good.  She has had no cognitive issues or personality changes.  She has no worsening diarrhea.  She has had no melena or BRBPR.  She has no nausea, vomiting or abdominal pain.   She has chronic bilateral knee and occasional left hip pain.  She has no new MSK pain.  No other symptoms or concerns today.   Plasma Cell Disorder History The patient was screened for a plasma cell disorder in 09/2008.  The reason for initial screening is unclear from the notes but potentially due to neuropathy.    Baseline Labs 09/2008 WBC count 6.7, ANC 4.9, ALC 1.3, Hgb 12.7, MCV 91.4, Plts 199 Cr 0.63, Ca 8.9, albumin 4.1, TP 6.7 AST 60, ALT 80, AP 92, t bili 0.6 SPEP/IFE: Not available for review Serum IgG 617, IgA 132, IgM 410 mg/dL Serum free kappa light chains <0.57 mg/dL, lambda light chains 67.79 mg/dL, FLC cannot be calculated.  24-Hour UPEP (07/2009): 27 mg of total protein, no monoclonal protein. B2M 1.28, LDH 165  Baseline Imaging -- Skeletal Survey 09/27/2008 Cystic lesion in the proximal left femoral metadiaphysis.  The  appearance and location are most typical for a bone cyst, although  given the lytic appearance, myelomatous involvement is a  possibility.  Consider bone scan for further evaluation.  No other  lytic or sclerotic lesion is identified.   Baseline Pathology -- Bone Marrow Examination 10/05/2008: 50 - 60% cellular with 5 - 10% plasma cells by CD138 IHC (lambda light chain predominant by ISH).    Cytogenetics: 46 XX [?]  MM FISH and flow cytometry not available for review.    Treatment Course -- Active surveillance  Therapy Line 1 -- Bortezomib  and dexamethasone  beginning 11/24/2013.  Therapy stopped on 07/13/2014  due to plateau and response and for a break from therapy.  She received 31 weeks of treatment.  Best response: MR by IgM levels, PR by lambda light chains.    -- Bortezomib  and dexamethasone  beginning 02/01/2015.  Treated through 11/01/2015.  Best response: SD.  Discontinued for biochemical progression.    Therapy Line 2 -- Lenalidomide  and dexamethasone  beginning 11/27/2015.  Best response: SD.  Discontinued after 3 cycles due to lack of response.    Therapy Line 3 -- Lenalidomide , bortezomib  and dexamethasone  beginning 03/06/2016.  Treatment break from 06/2019 - 07/2020. Repeat bone marrow examination 01/2020 with 3 - 5% PCs by CD138 IHC (lambda light chain restricted by ISH).  Cytogenetics 55 XX [20].  Treatment resumed 07/19/2020.  Discontinued in 05/2021 due to patient and provider preference.  Best response: MR.  Therapy Line 4 -- Daratumumab , pomalidomide  and dex beginning 09/26/2022.  Pom 3 mg days 1 - 21 of a 28-day cycle, dex 20 mg weekly.  Course complicated by neutropenia and thrombocytopenia leading to several dose holds (10/10 dara skipped and C2 delayed 1 week to 10/23, 11/13 dara skipped, 12/04 dara skipped and pom held).  Best response: PD.      Therapy Line 5 -- Selinexor , carfilzomib  and dexamethasone  beginning 02/25/2023 as holding/bridging therapy for CAR T.  Therapy was held at multiple points due to cytopenias.  Best  response: PR.   -- Leukapheresis 07/11/2023 -- Fludarabine / cyclophosphamide LDC 8/08 - 08/18/2023 -- Cilta-cel CAR T 08/21/23  Bone Health  -- The patient has not been on antiresorptive therapy.    Review of Systems Review of Systems  Constitutional:  Positive for fatigue. Negative for fever.  HENT: Negative.    Eyes: Negative.   Respiratory: Negative.    Cardiovascular: Negative.   Gastrointestinal:  Positive for diarrhea (mild/intermittent and unchanged from baseline).       GERD  Endocrine: Negative.   Genitourinary: Negative.   Musculoskeletal: Negative.    Allergic/Immunologic: Positive for immunocompromised state.  Neurological:  Positive for numbness.  Hematological: Negative.   Psychiatric/Behavioral: Negative.      Medications Current Outpatient Medications on File Prior to Visit  Medication Sig Dispense Refill  . Accu-Chek Aviva Plus test strp test strip USE TO CHECK SUGAR TWICE A DAY 90    . acyclovir  (ZOVIRAX ) 400 mg tablet Take 1 tablet (400 mg total) by mouth 2 (two) times a day. 180 tablet 3  . ALPRAZolam  (XANAX ) 0.5 mg tablet Take 0.5 mg by mouth nightly as needed.    . azelastine  (ASTEPRO ) 205.5 mcg (0.15 %) spry nasal spray Administer 1 drop into each nostril 2 (two) times a day.    . BD Ultra-Fine Mini Pen Needle 31 gauge x 3/16 ndle AS DIRECTED SUBCUTANEOUS 5/DAY 90 DAYS    . blood-glucose sensor (Dexcom G7 Sensor) every 10 days; Duration: 90 days    . cetirizine (ZyrTEC) 10 mg tablet Take 1 tablet by mouth daily.    SABRA Dexcom G7 Sensor EVERY 10 DAYS    . diphenhydramine  HCl (BENADRYL  ORAL) Take 1 Dose by mouth as needed.    . fexofenadine (ALLEGRA) 60 mg tablet Take by mouth daily.    . fluticasone propion-salmeteroL (Advair  Diskus) 100-50 mcg/dose diskus inhaler 1 puff.    . insulin aspart  U-100 (NovoLOG  Flexpen U-100 Insulin) 100 unit/mL (3 mL) pen Inject 1 Units under the skin 3 (three) times a day before meals. 5-15 U    . insulin aspart  U-100 (NovoLOG  Flexpen U-100 Insulin) 100 unit/mL (3 mL) pen 5-15U 3 TIMES A DAY SUBCUTANEOUS 30 DAYS    . levothyroxine (SYNTHROID ) 137 mcg tablet     . loperamide (IMODIUM) 2 mg capsule Take 2 mg by mouth as needed.    SABRA MAGIC MOUTHWASH BMX Take 5 mL by mouth 4 (four) times a day as needed (mouth sores). 120 mL 1  . metFORMIN  (GLUCOPHAGE -XR) 500 mg 24 hr tablet Take 500 mg by mouth daily with dinner.    . omeprazole  (PriLOSEC) 20 mg DR capsule Take 1 capsule by mouth in the morning and 1 capsule at noon.    . Omnipod 5 G6-G7 Pods, Gen 5, crtg subcutaneous cartridge EVERY 48 HOURS     . ondansetron  (Zofran ) 8 mg tablet Take 8 mg by mouth on Day -2 and then may take every 8 hours as needed for nausea. 30 tablet 3  . pravastatin  (PRAVACHOL ) 80 mg tablet Take 80 mg by mouth daily.    . sertraline  (ZOLOFT ) 50 mg tablet Take 3 tablets by mouth daily.    . verapamiL  (CALAN  SR) 240 mg CR tablet Take 240 mg by mouth 2 (two) times a day.    . lisinopriL  (PRINIVIL ) 10 mg tablet Take 1 tablet by mouth nightly. (Patient not taking: Reported on 09/19/2023)     No current facility-administered medications on file prior to visit.  PMX/PSHx Patient Active Problem List  Diagnosis  . Hypertension  . Diabetes    (CMD)  . Hypercholesteremia  . Hypothyroidism  . Monoclonal gammopathy of unknown significance  . Multiple myeloma    (CMD)  . Sleep apnea treated with continuous positive airway pressure (CPAP)  . Other cirrhosis of liver    (CMD)  . Metabolic dysfunction-associated steatohepatitis (MASH)  . Elevated liver enzymes  . Neuropathy  . Carpal tunnel syndrome  . Diarrhea, unspecified  . Neutropenic fever  . Cytokine release syndrome, grade 1  . History of chimeric antigen receptor T-cell therapy  . Allergic contact dermatitis due to adhesives     Past Surgical History:  Procedure Laterality Date  . BONE MARROW BIOPSY    . CATARACT EXTRACTION, BILATERAL Bilateral   . FOOT SURGERY Bilateral    small toe  . KNEE ARTHROSCOPY W/ MENISCAL REPAIR Bilateral   . LAPAROSCOPIC CHOLECYSTECTOMY    . LUMBAR DISC SURGERY     herniation, L4-L5  . NASAL SINUS SURGERY    . UTERINE FIBROID EMBOLIZATION     HTA and polyp resection ablation    Family History  Problem Relation Name Age of Onset  . Stroke Mother    . Hypertension Mother    . Asthma Mother    . Other Mother         spinal stenosis  . Tremor Mother    . Arrhythmia Mother    . Kidney disease Father    . Heart failure Father    . Valvular heart disease Father    . Liver disease Father    . No Known Problems  Sister    . Leukemia Maternal Grandmother    . Abdominal aortic aneursym Maternal Grandfather    . Valvular heart disease Paternal Grandmother    . Prostate cancer Paternal Grandfather      Social History   Tobacco Use  . Smoking status: Never  . Smokeless tobacco: Never  Substance Use Topics  . Alcohol use: Not Currently     Objective: Vitals:   09/19/23 0948  BP: 133/66  Pulse: 69  Resp: 16  Temp: 98.1 F (36.7 C)  SpO2: 99%      Body surface area is 2.37 meters squared.    Physical Exam Vitals reviewed.  Constitutional:      Appearance: Normal appearance.  HENT:     Head: Normocephalic and atraumatic.     Mouth/Throat:     Mouth: Mucous membranes are moist.     Pharynx: Oropharynx is clear. No oropharyngeal exudate or posterior oropharyngeal erythema.  Eyes:     Conjunctiva/sclera: Conjunctivae normal.  Cardiovascular:     Rate and Rhythm: Normal rate and regular rhythm.     Heart sounds: Normal heart sounds. No murmur heard.    No gallop.  Pulmonary:     Effort: Pulmonary effort is normal.     Breath sounds: Normal breath sounds.  Abdominal:     General: Abdomen is flat. There is no distension.     Palpations: Abdomen is soft. There is no mass.     Tenderness: There is no abdominal tenderness.  Musculoskeletal:        General: No swelling or deformity.     Right lower leg: No edema.     Left lower leg: No edema.  Lymphadenopathy:     Cervical: No cervical adenopathy.  Skin:    Findings: Lesion (scattered hemangiomas on her arms and back) present. No rash.  Neurological:     Mental Status: She is alert. Mental status is at baseline.  Psychiatric:        Mood and Affect: Mood normal.        Behavior: Behavior normal.        Thought Content: Thought content normal.        Judgment: Judgment normal.     Hematologic: No cervical, supraclavicular, epitrochlear, or inguinal lymphadenopathy.  No hepatosplenomegaly  ECOG PS: 1 (Strenuous physical  activity restricted; fully ambulatory and able to carry out light work)   KPS PS: 80 Normal activity with effort; some signs or symptoms of disease.  Oral Chemo Candidate: Patient is an appropriate oral chemotherapy candidate with no barriers to adherence identified.  Laboratory tests Infusion on 09/19/2023  Component Date Value Ref Range Status  . Sodium 09/19/2023 139  134 - 143 mmol/L Final  . Potassium 09/19/2023 3.5  3.5 - 5.1 mmol/L Final  . Chloride 09/19/2023 104  98 - 107 mmol/L Final  . CO2 09/19/2023 25  21 - 31 mmol/L Final  . Anion Gap 09/19/2023 10  3 - 15 mmol/L Final  . Glucose, Random 09/19/2023 117  70 - 125 mg/dL Final  . Blood Urea Nitrogen (BUN) 09/19/2023 12  8 - 30 mg/dL Final  . Creatinine 90/88/7974 0.75  0.51 - 0.95 mg/dL Final  . eGFR 90/88/7974 89  >59 mL/min/1.13m2 Final  . Albumin 09/19/2023 4.3  3.7 - 4.8 g/dL Final  . Total Protein 09/19/2023 5.7 (L)  6.3 - 8.3 g/dL Final  . Bilirubin, Total 09/19/2023 1.0  0.3 - 1.0 mg/dL Final  . Alkaline Phosphatase (ALP) 09/19/2023 138 (H)  38 - 120 U/L Final  . Aspartate Aminotransferase (AST) 09/19/2023 39  13 - 39 U/L Final  . Alanine Aminotransferase (ALT) 09/19/2023 24  7 - 52 U/L Final  . Calcium 09/19/2023 8.7  8.6 - 10.3 mg/dL Final  . BUN/Creatinine Ratio 09/19/2023    Final  . Magnesium 09/19/2023 1.7  1.6 - 2.5 mg/dL Final  . Lactate Dehydrogenase (LDH) 09/19/2023 169  140 - 290 U/L Final  . Phosphorus 09/19/2023 4.4  2.5 - 5.0 mg/dL Final  . Ferritin 90/88/7974 45  11 - 307 ng/mL Final  . Fibrinogen  09/19/2023 180 (L)  206 - 498 mg/dL Final  . C-Reactive Protein (CRP) 09/19/2023 <0.5  <0.5 mg/dL Final  . Immunoelectrophoresis (IFE), Serum 09/19/2023 IgM Lambda monoclonal protein.  Additional Lambda free light chains and faint IgG Kappa monoclonal protein.   Final  . Pathology Review Serum Immunoelect* 09/19/2023 Electronically signed by Delon Buss.    Final  . Free Kappa Light Chains, Serum  09/19/2023 <0.60 (L)  3.30 - 19.40 mg/L Final  . Free Lambda Light Chains, Serum 09/19/2023 <1.35 (L)  5.71 - 26.30 mg/L Final  . Kappa/Lambda Ratio, Serum 09/19/2023    Final  . Total Protein 09/19/2023 5.6 (L)  6.3 - 8.3 g/dL Final  . Electrophoresis, Albumin 09/19/2023 4.1  3.4 - 5.2 g/dL Final  . Joeyj-8-Honalopw 09/19/2023 0.2  0.2 - 0.4 g/dL Final  . Beta Globulin 09/19/2023 0.7  0.5 - 1.1 g/dL Final  . Alpha-2-Globulin 09/19/2023 0.4  0.4 - 0.9 g/dL Final  . Gamma Globulin 09/19/2023 0.2 (L)  0.7 - 1.5 g/dL Final  . Protein Electrophoresis Interpreta* 09/19/2023 Monoclonal band seen in the gamma region on IFE, IgM Lambda. Additional monoclonal bands, Lambda free light chains and IgG Kappa monoclonal protein, are not discernible on SPEP.   Final  .  Pathology Review Serum Electrophor* 09/19/2023 Electronically signed by Delon Buss.    Final  . Immunoglobulin M, Quantitative (Ig* 09/19/2023 27 (L)  45 - 281 mg/dL Final  . Immunoglobulin G, Quantitative (Ig* 09/19/2023 138 (L)  635 - 1,741 mg/dL Final  . Immunoglobulin A, Quantitative (Ig* 09/19/2023 <10 (L)  66 - 433 mg/dL Final  . WBC 90/88/7974 2.20 (L)  3.60 - 11.70 10*3/uL Final  . RBC 09/19/2023 2.73 (L)  3.72 - 5.24 10*6/uL Final  . Hemoglobin 09/19/2023 7.9 (L)  10.8 - 15.5 g/dL Final  . Hematocrit 90/88/7974 24 (L)  34 - 46 % Final  . Mean Corpuscular Volume (MCV) 09/19/2023 87  80 - 100 fL Final  . Mean Corpuscular Hemoglobin (MCH) 09/19/2023 29  26 - 34 pg Final  . Mean Corpuscular Hemoglobin Conc (* 09/19/2023 33  32 - 35 g/dL Final  . Red Cell Distribution Width (RDW) 09/19/2023 18.1 (H)  12.2 - 16.6 % Final  . Platelet Count (PLT) 09/19/2023 54 (L)  153 - 400 10*3/uL Final  . Mean Platelet Volume (MPV) 09/19/2023 7.4  7.3 - 11.2 fL Final  . Neutrophils % 09/19/2023 78  % Final  . Lymphocytes % 09/19/2023 8  % Final  . Monocytes % 09/19/2023 9  % Final  . Eosinophils % 09/19/2023 3  % Final  . Basophils % 09/19/2023 2   % Final  . Neutrophils Absolute 09/19/2023 1.71  1.60 - 8.20 10*3/uL Final  . Lymphocytes # 09/19/2023 0.18 (L)  1.07 - 3.45 10*3/uL Final  . Monocytes # 09/19/2023 0.20  0.20 - 0.85 10*3/uL Final  . Eosinophils # 09/19/2023 0.07  0.00 - 0.30 10*3/uL Final  . Basophils # 09/19/2023 0.03  0.00 - 0.10 10*3/uL Final  . Ferritin 09/19/2023 43  11 - 307 ng/mL Final  . Folate 09/19/2023 8.5  >5.8 ng/mL Final  . Vitamin B-12 09/19/2023 >1,500 (H)  180 - 914 pg/mL Final  . Reticulocyte Absolute 09/19/2023 99.6 (H)  31.0 - 88.0 10*3/uL Final  . Reticulocyte % 09/19/2023 3.6 (H)  0.7 - 2.5 % Final  . Immature Reticulocyte Fraction 09/19/2023 0.4  0.2 - 0.5 Final  . Iron 09/19/2023 42 (L)  60 - 180 ug/dL Final  . Transferrin 90/88/7974 463 (H)  203 - 362 mg/dL Final  . Total Iron Binding Capacity (TIBC) 09/19/2023 648 (H)  250 - 450 ug/dL Final  . Transferrin Saturation 09/19/2023 6 (L)  15 - 50 % Final  . Color, Urine 09/19/2023 Straw  Yellow Final  . Clarity, Urine 09/19/2023 Clear  Clear Final  . Specific Gravity, Urine 09/19/2023 1.006  1.005 - 1.030 Final  . pH, Urine 09/19/2023 6.0  5.0 - 8.0 Final  . Protein, Urine 09/19/2023 Negative  Negative mg/dL Final  . Glucose, Urine 09/19/2023 Negative  Negative mg/dL Final  . Ketones, Urine 09/19/2023 Negative  Negative mg/dL Final  . Bilirubin, Urine 09/19/2023 Negative  Negative mg/dL Final  . Blood, Urine 90/88/7974 Negative  Negative mg/dL Final  . Nitrite, Urine 09/19/2023 Negative  Negative Final  . Leukocyte Esterase, Urine 09/19/2023 Negative  Negative Leu/uL Final  . Urobilinogen, Urine 09/19/2023 Negative  <2.0 mg/dL Final  . WBC, Urine 90/88/7974 0-5  0 - 5 /HPF Final  . RBC, Urine 09/19/2023 0-2  0 - 2 /HPF Final  . Squamous Epithelial Cells, Urine 09/19/2023 0-5  0 - 5 /HPF Final  . Bacteria, Urine 09/19/2023 None Seen  None Seen, Rare /HPF Final  . Urine  Culture 09/19/2023 No uropathogens isolated.   Final  . CMV DNA 09/19/2023 Not  Detected  Not Detected Final  . Methodology 09/19/2023    Final                   Value: cobasCMV is an in vitro nucleic acid amplification assay run on the [id number] system. Real time quantitative PCR enables the detection and quantification of CMV DNA in EDTA plasma of infected transplant patients.    . . 09/19/2023    Final                   Value:This test has been cleared by the U.S. Food and Drug Administration (FDA) and its performance characteristics have been verified by this laboratory. This laboratory is certified under the Clinical Laboratory Improvement Amendments of 1988 (CLIA '88) as qualified to perform high complexity clinical testing. For instances where archival tissue specimens were used for testing, the collection date shown may not reflect the true collection date of the specimen. When applicable, please refer to the collection date of the original tissue case noted in this report.   . Parvovirus B19 PCR (Plasma) 09/19/2023 Not Detected  Not Detected IU/mL Final   PATHOLOGY -- Fat Pad Aspirate 08/23/2022 - BENIGN ADIPOSE TISSUE, NO EVIDENCE OF AMYLOIDOSIS.  - SPECIAL STAINS FOR AMYLOID (CONGO RED AND THIOFLAVIN T) ARE NEGATIVE.  -- Bone Marrow Examination 06/13/2023: Normocellular marrow (40%) with less than 5% PCs by CD138 IHC.  No dysplasia.  Absent iron stores.    Radiology  -- Whole Body PET-CT 06/17/2023 No specific areas of abnormal radiotracer uptake identified along  the skeleton nor lymph nodes.   Hepatosplenomegaly identified. Nodular appearance to the liver as  well. Please correlate for history of chronic liver disease.   Stable right adnexal, likely ovarian cyst without uptake. Please  correlate with prior workup and additional surveillance is  recommended.   Scattered degenerative changes. Bilateral knee joint effusions, left  greater than right. Please correlate with history.

## 2023-09-20 ENCOUNTER — Other Ambulatory Visit: Payer: Self-pay

## 2023-09-23 ENCOUNTER — Other Ambulatory Visit (HOSPITAL_BASED_OUTPATIENT_CLINIC_OR_DEPARTMENT_OTHER): Payer: Self-pay | Admitting: Certified Nurse Midwife

## 2023-09-23 ENCOUNTER — Telehealth: Payer: Self-pay | Admitting: Internal Medicine

## 2023-09-23 ENCOUNTER — Other Ambulatory Visit: Payer: Self-pay | Admitting: Medical Oncology

## 2023-09-23 DIAGNOSIS — D649 Anemia, unspecified: Secondary | ICD-10-CM

## 2023-09-23 DIAGNOSIS — F419 Anxiety disorder, unspecified: Secondary | ICD-10-CM

## 2023-09-24 ENCOUNTER — Other Ambulatory Visit: Payer: Self-pay | Admitting: Medical Oncology

## 2023-09-24 ENCOUNTER — Other Ambulatory Visit: Payer: Self-pay

## 2023-09-24 DIAGNOSIS — C9000 Multiple myeloma not having achieved remission: Secondary | ICD-10-CM

## 2023-09-26 ENCOUNTER — Other Ambulatory Visit: Payer: Self-pay | Admitting: Medical Oncology

## 2023-09-26 ENCOUNTER — Inpatient Hospital Stay

## 2023-09-26 ENCOUNTER — Inpatient Hospital Stay: Attending: Internal Medicine

## 2023-09-26 DIAGNOSIS — C9000 Multiple myeloma not having achieved remission: Secondary | ICD-10-CM | POA: Diagnosis present

## 2023-09-26 DIAGNOSIS — D649 Anemia, unspecified: Secondary | ICD-10-CM | POA: Insufficient documentation

## 2023-09-26 DIAGNOSIS — Z79899 Other long term (current) drug therapy: Secondary | ICD-10-CM | POA: Insufficient documentation

## 2023-09-26 DIAGNOSIS — Z7952 Long term (current) use of systemic steroids: Secondary | ICD-10-CM | POA: Diagnosis not present

## 2023-09-26 DIAGNOSIS — Z7961 Long term (current) use of immunomodulator: Secondary | ICD-10-CM | POA: Diagnosis not present

## 2023-09-26 DIAGNOSIS — Z7969 Long term (current) use of other immunomodulators and immunosuppressants: Secondary | ICD-10-CM | POA: Diagnosis not present

## 2023-09-26 DIAGNOSIS — Z7982 Long term (current) use of aspirin: Secondary | ICD-10-CM | POA: Diagnosis not present

## 2023-09-26 DIAGNOSIS — Z79624 Long term (current) use of inhibitors of nucleotide synthesis: Secondary | ICD-10-CM | POA: Insufficient documentation

## 2023-09-26 DIAGNOSIS — Z7984 Long term (current) use of oral hypoglycemic drugs: Secondary | ICD-10-CM | POA: Insufficient documentation

## 2023-09-26 DIAGNOSIS — D696 Thrombocytopenia, unspecified: Secondary | ICD-10-CM | POA: Insufficient documentation

## 2023-09-26 LAB — CBC WITH DIFFERENTIAL (CANCER CENTER ONLY)
Abs Immature Granulocytes: 0.01 K/uL (ref 0.00–0.07)
Basophils Absolute: 0 K/uL (ref 0.0–0.1)
Basophils Relative: 1 %
Eosinophils Absolute: 0.1 K/uL (ref 0.0–0.5)
Eosinophils Relative: 5 %
HCT: 26.2 % — ABNORMAL LOW (ref 36.0–46.0)
Hemoglobin: 8.3 g/dL — ABNORMAL LOW (ref 12.0–15.0)
Immature Granulocytes: 0 %
Lymphocytes Relative: 7 %
Lymphs Abs: 0.2 K/uL — ABNORMAL LOW (ref 0.7–4.0)
MCH: 27.5 pg (ref 26.0–34.0)
MCHC: 31.7 g/dL (ref 30.0–36.0)
MCV: 86.8 fL (ref 80.0–100.0)
Monocytes Absolute: 0.3 K/uL (ref 0.1–1.0)
Monocytes Relative: 11 %
Neutro Abs: 1.9 K/uL (ref 1.7–7.7)
Neutrophils Relative %: 76 %
Platelet Count: 77 K/uL — ABNORMAL LOW (ref 150–400)
RBC: 3.02 MIL/uL — ABNORMAL LOW (ref 3.87–5.11)
RDW: 15.9 % — ABNORMAL HIGH (ref 11.5–15.5)
WBC Count: 2.5 K/uL — ABNORMAL LOW (ref 4.0–10.5)
nRBC: 0 % (ref 0.0–0.2)

## 2023-09-26 LAB — CMP (CANCER CENTER ONLY)
ALT: 19 U/L (ref 0–44)
AST: 33 U/L (ref 15–41)
Albumin: 4.5 g/dL (ref 3.5–5.0)
Alkaline Phosphatase: 128 U/L — ABNORMAL HIGH (ref 38–126)
Anion gap: 8 (ref 5–15)
BUN: 8 mg/dL (ref 8–23)
CO2: 26 mmol/L (ref 22–32)
Calcium: 9 mg/dL (ref 8.9–10.3)
Chloride: 109 mmol/L (ref 98–111)
Creatinine: 0.76 mg/dL (ref 0.44–1.00)
GFR, Estimated: >60 mL/min (ref >60)
Glucose, Bld: 91 mg/dL (ref 70–99)
Potassium: 3.8 mmol/L (ref 3.5–5.1)
Sodium: 143 mmol/L (ref 135–145)
Total Bilirubin: 1.1 mg/dL (ref 0.0–1.2)
Total Protein: 6.3 g/dL — ABNORMAL LOW (ref 6.5–8.1)

## 2023-09-26 LAB — LACTATE DEHYDROGENASE: LDH: 174 U/L (ref 98–192)

## 2023-09-26 LAB — C-REACTIVE PROTEIN: CRP: 0.6 mg/dL (ref <1.0)

## 2023-09-26 LAB — FIBRINOGEN: Fibrinogen: 168 mg/dL — ABNORMAL LOW (ref 210–475)

## 2023-09-26 LAB — FERRITIN: Ferritin: 41 ng/mL (ref 11–307)

## 2023-09-26 NOTE — Progress Notes (Signed)
 QIG ordered per Dr. Sherrod. Pt notified of  CBC/diff results. I instructed to contact us  or Dr. Feliberto for any concerns or problems or signs of infection or bleeding.

## 2023-09-27 ENCOUNTER — Other Ambulatory Visit: Payer: Self-pay

## 2023-09-27 LAB — IGG, IGA, IGM
IgA: <5 mg/dL — ABNORMAL LOW (ref 87–352)
IgG (Immunoglobin G), Serum: 462 mg/dL — ABNORMAL LOW (ref 586–1602)
IgM (Immunoglobulin M), Srm: 17 mg/dL — ABNORMAL LOW (ref 26–217)

## 2023-09-28 ENCOUNTER — Encounter

## 2023-09-29 NOTE — Progress Notes (Unsigned)
 [facility] OFFICE PROGRESS NOTE  [name], MD [address] [place] [address]  DIAGNOSIS: IgM lambda multiple myeloma with neuropathy   PRIOR THERAPY: 1) Velcade  1.3 MG/M2 subcutaneously with Decadron  40 mg by mouth on a weekly basis. First cycle 11/24/2013. She status post 31 weekly doses of treatment. 2) Velcade  1.3 MG/M2 subcutaneously and weekly basis with Decadron  40 mg by mouth weekly. First dose 02/01/2015. Status post 28 cycles. 3) Revlimid  25 mg by mouth daily for 21 days every 4 weeks with weekly Decadron  20 mg. started in 11/27/2015. Status post 3 cycles discontinued secondary to lack of response. 4) Systemic treatment with Velcade  1.3 MG/KG weekly, Revlimid  25 mg by mouth daily for 21 days every 4 weeks in addition to Decadron  20 mg by mouth weekly. First dose 03/06/2016. Status post 42 cycles.  She has a break off treatment from June 2021 until July 2022. 5) Second line treatment with daratumumab , Pomalyst  3 mg for 21 days every 4 weeks as well as Decadron  20 mg p.o. weekly.  First dose 09/26/2022.  Status post 1 cycle and currently receiving day 15 of cycle #2. 6) s/p cilta-cel 08/21/23 anti-BCMA CAR T   CURRENT THERAPY: *** IVIg 0.4 g/kg every 4 weeks beginning 09/19/23. Transition this supportive care to Dr. Sherrod going forward.   INTERVAL HISTORY: Brenda Conner 63 y.o. female returns to the clinic today for follow-up visit.  In the interval since being seen the patient underwent ***  She tolerated this ***.   She last saw Dr. Mick on 09/19/23.  The plan is to ***. She is expected to see him on ***  They also recommended IDA workup  with local gstroenterologist for EGD/colonoscopy  The plan is to arrange for supportive care with IVIg 0.4 g/kg every 4 weeks beginning 09/19/23. Transition this supportive care to Dr. Sherrod going forward.   Continue acyclovir  for zoster reactivation prophylaxis   No fever, infections, nausea,  vomiting, nasal congestion, burning with urination, or changes in breathing. She has some fatigue. She also has intermittent diarrhea which is normal for her. She has not had recently nausea or vomiting. She is expected to start back on her selinexor  tomorrow. She has mild bilateral ankle swelling. She has some compression stockings but has not used them recently. he denies abnormal bleeding, or bruising. No signs of infection, nasal congestion, sore throat, dysuria, or skin infections.   She experiences intermittent diarrhea which is normal for her. She is here today for evaluation and repeat blood work.             MEDICAL HISTORY: Past Medical History:  Diagnosis Date   Acid reflux    Anemia    Anxiety    Asthma    Cancer (HCC)    waldenstroms/ macroglobinulemia   Depression    Depression    Diabetes mellitus without complication (HCC)    Dysuria 02/28/2016   Gallstones    Heart palpitations    Hypercholesteremia    Hypertension    Hypothyroidism    Macroglobulinemia    ? POEMS syndrome   Monoclonal gammopathy of unknown significance (MGUS)    Multiple myeloma (HCC)    Obesity    POEMS syndrome    PONV (postoperative nausea and vomiting)    Sleep apnea    CPAP at bedtime    ALLERGIES:  is allergic to codeine, hydrocodone , lortab [hydrocodone -acetaminophen ], onion, shellfish allergy, and amoxicillin.  MEDICATIONS:  Current Outpatient Medications  Medication Sig Dispense Refill   ACCU-CHEK AVIVA PLUS test strip USE TO CHECK SUGAR TWICE A DAY 90     acyclovir  (ZOVIRAX ) 400 MG tablet Take 1 tablet (400 mg total) by mouth daily. 30 tablet 3   ALPRAZolam  (XANAX ) 0.5 MG tablet Take 1 tablet (0.5 mg total) by mouth at bedtime as needed for anxiety. 30 tablet 1   aspirin 81 MG chewable tablet Chew by mouth daily.     Azelastine  HCl 0.15 % SOLN as needed.     azithromycin  (ZITHROMAX ) 250 MG tablet Use as instructed 6 each 0   B-D UF III MINI PEN NEEDLES 31G X 5 MM MISC       Blood Glucose Monitoring Suppl (ONE TOUCH ULTRA MINI) W/DEVICE KIT See admin instructions. Reported on 01/25/2015  0   cetirizine (ZYRTEC ALLERGY) 10 MG tablet Take 1 tablet by mouth daily.     Continuous Glucose Sensor (FREESTYLE LIBRE 3 SENSOR) MISC USE 1 SENSOR EVERY 14 DAYS     dexamethasone  (DECADRON ) 4 MG tablet Take 5 tablets (20 mg total) by mouth once a week. Take the day after darzalex  faspro. Take with breakfast. 20 tablet 11   dexamethasone  (DECADRON ) 4 MG tablet Take 5 tablets (20mg ) on days 1, 8, 15 and 22 every 28 days 20 tablet 4   DiphenhydrAMINE  HCl (BENADRYL  ALLERGY PO) Take 1 Dose by mouth daily at 6 (six) AM.     fexofenadine (ALLEGRA ALLERGY) 180 MG tablet Take 1 tablet by mouth daily.     Fluticasone-Salmeterol (ADVAIR ) 100-50 MCG/DOSE AEPB Inhale 2 puffs into the lungs every 12 (twelve) hours.     glucosamine-chondroitin 500-400 MG tablet Take 1 tablet by mouth daily.     ibuprofen (ADVIL,MOTRIN) 100 MG tablet Take 100 mg by mouth every 6 (six) hours as needed. Reported on 05/31/2015     Insulin Glargine  (BASAGLAR  KWIKPEN) 100 UNIT/ML Inject 20 Units into the skin at bedtime.     insulin glargine -yfgn (SEMGLEE) 100 UNIT/ML Pen Inject into the skin 2 (two) times daily. Lantus was out of stock, replacement     levothyroxine (SYNTHROID ) 137 MCG tablet Take 137 mcg by mouth daily.     lidocaine -prilocaine  (EMLA ) cream Apply 1 Application topically as needed. 30 g 2   lisinopril  (PRINIVIL ,ZESTRIL ) 10 MG tablet Take 10 mg by mouth daily. auth number 05/29/2018 [id number]  3   loperamide (IMODIUM) 2 MG capsule Take 2 mg by mouth as needed for diarrhea or loose stools.     magic mouthwash w/lidocaine  SOLN Take 5 mLs by mouth 4 (four) times daily as needed for mouth pain. Swish, Gargle, and spit 240 mL 1   Melatonin 5 MG CAPS Take 1 capsule by mouth at bedtime.     metFORMIN  (GLUCOPHAGE -XR) 500 MG 24 hr tablet Take 500 mg by mouth daily.     NOVOLOG  FLEXPEN 100 UNIT/ML FlexPen Inject  15 Units into the skin in the morning, at noon, and at bedtime. Per Sliding Scale     OLANZapine  (ZYPREXA ) 5 MG tablet TAKE ONE TABLET THE DAY BEFORE, THE DAY OF, AND THE DAY AFTER SELINEXOR . 90 tablet 1   omeprazole  (PRILOSEC) 20 MG capsule Take 20 mg by mouth 2 (two) times daily.     ondansetron  (ZOFRAN ) 8 MG tablet Take 1 tablet (8 mg total) by mouth every 8 (eight) hours as needed for nausea or vomiting. 90 tablet 1   pravastatin  (PRAVACHOL ) 80 MG tablet Take 80 mg by mouth daily.  PROAIR  HFA 108 (90 Base) MCG/ACT inhaler Reported on 06/21/2015  1   prochlorperazine  (COMPAZINE ) 10 MG tablet Take 1 tablet (10 mg total) by mouth every 6 (six) hours as needed for nausea or vomiting. 30 tablet 1   selinexor  (XPOVIO , 60 MG ONCE WEEKLY,) Therapy Pack (60 mg once weekly) Take 1 tablet (60 mg total) by mouth once a week. 4 tablet 0   sertraline  (ZOLOFT ) 50 MG tablet TAKE 3 TABLETS BY MOUTH EVERY DAY 270 tablet 1   verapamil  (CALAN -SR) 240 MG CR tablet Take 240 mg by mouth 2 (two) times daily.     No current facility-administered medications for this visit.    SURGICAL HISTORY:  Past Surgical History:  Procedure Laterality Date   ABLATION  09/09/2007   HTA and polyp resection   BONE MARROW BIOPSY  04/08/2012   BONE MARROW BIOPSY  01/2020   CATARACT EXTRACTION, BILATERAL Bilateral 02/2022   FOOT SURGERY Bilateral 01/09/1996   small toe   IR IMAGING GUIDED PORT INSERTION  03/08/2023   KNEE ARTHROSCOPY W/ MENISCAL REPAIR Bilateral 10/10 ; 3/11   LAPAROSCOPIC CHOLECYSTECTOMY  01/09/1995   LUMBAR DISC SURGERY  03/08/2004   herniation, L4-L5   NASAL SINUS SURGERY  01/08/2002   UTERINE FIBROID EMBOLIZATION  2009   HTA and polyp resection    REVIEW OF SYSTEMS:   Review of Systems  Constitutional: Negative for appetite change, chills, fatigue, fever and unexpected weight change.  HENT:   Negative for mouth sores, nosebleeds, sore throat and trouble swallowing.   Eyes: Negative for eye  problems and icterus.  Respiratory: Negative for cough, hemoptysis, shortness of breath and wheezing.   Cardiovascular: Negative for chest pain and leg swelling.  Gastrointestinal: Negative for abdominal pain, constipation, diarrhea, nausea and vomiting.  Genitourinary: Negative for bladder incontinence, difficulty urinating, dysuria, frequency and hematuria.   Musculoskeletal: Negative for back pain, gait problem, neck pain and neck stiffness.  Skin: Negative for itching and rash.  Neurological: Negative for dizziness, extremity weakness, gait problem, headaches, light-headedness and seizures.  Hematological: Negative for adenopathy. Does not bruise/bleed easily.  Psychiatric/Behavioral: Negative for confusion, depression and sleep disturbance. The patient is not nervous/anxious.     PHYSICAL EXAMINATION:  There were no vitals taken for this visit.  ECOG PERFORMANCE STATUS: {CHL ONC ECOG [id number]  Physical Exam  Constitutional: Oriented to person, place, and time and well-developed, well-nourished, and in no distress. No distress.  HENT:  Head: Normocephalic and atraumatic.  Mouth/Throat: Oropharynx is clear and moist. No oropharyngeal exudate.  Eyes: Conjunctivae are normal. Right eye exhibits no discharge. Left eye exhibits no discharge. No scleral icterus.  Neck: Normal range of motion. Neck supple.  Cardiovascular: Normal rate, regular rhythm, normal heart sounds and intact distal pulses.   Pulmonary/Chest: Effort normal and breath sounds normal. No respiratory distress. No wheezes. No rales.  Abdominal: Soft. Bowel sounds are normal. Exhibits no distension and no mass. There is no tenderness.  Musculoskeletal: Normal range of motion. Exhibits no edema.  Lymphadenopathy:    No cervical adenopathy.  Neurological: Alert and oriented to person, place, and time. Exhibits normal muscle tone. Gait normal. Coordination normal.  Skin: Skin is warm and dry. No rash noted. Not  diaphoretic. No erythema. No pallor.  Psychiatric: Mood, memory and judgment normal.  Vitals reviewed.  LABORATORY DATA: Lab Results  Component Value Date   WBC 2.5 (L) 09/26/2023   HGB 8.3 (L) 09/26/2023   HCT 26.2 (L) 09/26/2023   MCV 86.8 09/26/2023  PLT 77 (L) 09/26/2023      Chemistry      Component Value Date/Time   NA 143 09/26/2023 1106   NA 138 11/14/2016 0824   K 3.8 09/26/2023 1106   K 4.3 11/14/2016 0824   CL 109 09/26/2023 1106   CL 104 04/07/2012 1415   CO2 26 09/26/2023 1106   CO2 23 11/14/2016 0824   BUN 8 09/26/2023 1106   BUN 9.1 11/14/2016 0824   CREATININE 0.76 09/26/2023 1106   CREATININE 0.7 11/14/2016 0824      Component Value Date/Time   CALCIUM 9.0 09/26/2023 1106   CALCIUM 9.6 11/14/2016 0824   ALKPHOS 128 (H) 09/26/2023 1106   ALKPHOS 94 11/14/2016 0824   AST 33 09/26/2023 1106   AST 28 11/14/2016 0824   ALT 19 09/26/2023 1106   ALT 24 11/14/2016 0824   BILITOT 1.1 09/26/2023 1106   BILITOT 0.41 11/14/2016 0824       RADIOGRAPHIC STUDIES:  No results found.   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    The patient previously underwent 107 cycles of subcutaneous weekly Velcade , as well as Revlimid  and Decadron  on and off over the last several years.   She had been off treatment since June 2021 until July 2022 in which she had evidence of disease progression.  Therefore, she was restarted on Velcade , Revlimid , and Decadron .  She has restarted treatment in July 2022 with cycle 107 of weekly velcade . She tolerated well without any concerning adverse side effects.  She was then given a break.   She was seen recently by Dr. Nichole at [facility] for second opinion and after extensive investigation is recommended for her to resume her treatment for the multiple myeloma with daratumumab , Pomalyst  3 Mg p.o. daily for 21 days every 4  weeks and Decadron  20 mg weekly. This was started in September 2024. This was discontinued due to disease progression in December 2024.    She saw multiple myeloma expert, Dr. Ariel recently who recommended Selinexor  (oral), carfilzomib  (infusion), and Decadron  was recommended. She started this on 02/25/23.    s/p cilta-cel 08/21/23 anti-BCMA CAR T   She is here to re-establish care.   Labs were reviewed. The patient was seen with Dr. Sherrod. Recommend she *** ***IVIG  She will follow with GI locally to consider colonoscopy and EGD  F/U  ***iron?  The patient was advised to call immediately if she has any concerning symptoms in the interval. The patient voices understanding of current disease status and treatment options and is in agreement with the current care plan. All questions were answered. The patient knows to call the clinic with any problems, questions or concerns. We can certainly see the patient much sooner if necessary   No orders of the defined types were placed in this encounter.    I spent {CHL ONC TIME VISIT - DTPQU:[id number]} counseling the patient face to face. The total time spent in the appointment was {CHL ONC TIME VISIT - DTPQU:[id number]}.  Abdulah Iqbal L Maida Widger, PA-C 09/29/23

## 2023-09-30 ENCOUNTER — Other Ambulatory Visit: Payer: Self-pay | Admitting: Medical Oncology

## 2023-09-30 ENCOUNTER — Telehealth: Payer: Self-pay | Admitting: Medical Oncology

## 2023-09-30 DIAGNOSIS — C9000 Multiple myeloma not having achieved remission: Secondary | ICD-10-CM

## 2023-09-30 NOTE — Telephone Encounter (Signed)
 Dr Nichole wants weekly labs for CBC/diff, CMP, ferritin, fibrinogen , CRP and BB sample to week . Orders entered as standing.

## 2023-10-01 ENCOUNTER — Inpatient Hospital Stay: Admitting: Physician Assistant

## 2023-10-01 ENCOUNTER — Inpatient Hospital Stay

## 2023-10-01 ENCOUNTER — Other Ambulatory Visit: Payer: Self-pay | Admitting: Physician Assistant

## 2023-10-01 ENCOUNTER — Telehealth: Payer: Self-pay

## 2023-10-01 VITALS — BP 174/72 | HR 67 | Temp 97.7°F | Resp 13 | Wt 258.3 lb

## 2023-10-01 DIAGNOSIS — C9000 Multiple myeloma not having achieved remission: Secondary | ICD-10-CM

## 2023-10-01 LAB — CMP (CANCER CENTER ONLY)
ALT: 19 U/L (ref 0–44)
AST: 32 U/L (ref 15–41)
Albumin: 4.2 g/dL (ref 3.5–5.0)
Alkaline Phosphatase: 113 U/L (ref 38–126)
Anion gap: 6 (ref 5–15)
BUN: 8 mg/dL (ref 8–23)
CO2: 29 mmol/L (ref 22–32)
Calcium: 8.6 mg/dL — ABNORMAL LOW (ref 8.9–10.3)
Chloride: 108 mmol/L (ref 98–111)
Creatinine: 0.73 mg/dL (ref 0.44–1.00)
GFR, Estimated: >60 mL/min (ref >60)
Glucose, Bld: 88 mg/dL (ref 70–99)
Potassium: 3.2 mmol/L — ABNORMAL LOW (ref 3.5–5.1)
Sodium: 143 mmol/L (ref 135–145)
Total Bilirubin: 0.8 mg/dL (ref 0.0–1.2)
Total Protein: 5.9 g/dL — ABNORMAL LOW (ref 6.5–8.1)

## 2023-10-01 LAB — CBC WITH DIFFERENTIAL (CANCER CENTER ONLY)
Abs Immature Granulocytes: 0.01 K/uL (ref 0.00–0.07)
Basophils Absolute: 0 K/uL (ref 0.0–0.1)
Basophils Relative: 1 %
Eosinophils Absolute: 0.1 K/uL (ref 0.0–0.5)
Eosinophils Relative: 3 %
HCT: 24.7 % — ABNORMAL LOW (ref 36.0–46.0)
Hemoglobin: 7.8 g/dL — ABNORMAL LOW (ref 12.0–15.0)
Immature Granulocytes: 1 %
Lymphocytes Relative: 9 %
Lymphs Abs: 0.2 K/uL — ABNORMAL LOW (ref 0.7–4.0)
MCH: 27.1 pg (ref 26.0–34.0)
MCHC: 31.6 g/dL (ref 30.0–36.0)
MCV: 85.8 fL (ref 80.0–100.0)
Monocytes Absolute: 0.2 K/uL (ref 0.1–1.0)
Monocytes Relative: 11 %
Neutro Abs: 1.6 K/uL — ABNORMAL LOW (ref 1.7–7.7)
Neutrophils Relative %: 75 %
Platelet Count: 52 K/uL — ABNORMAL LOW (ref 150–400)
RBC: 2.88 MIL/uL — ABNORMAL LOW (ref 3.87–5.11)
RDW: 15.2 % (ref 11.5–15.5)
WBC Count: 2.1 K/uL — ABNORMAL LOW (ref 4.0–10.5)
nRBC: 0 % (ref 0.0–0.2)

## 2023-10-01 LAB — SAMPLE TO BLOOD BANK

## 2023-10-01 LAB — FERRITIN: Ferritin: 27 ng/mL (ref 11–307)

## 2023-10-01 LAB — PREPARE RBC (CROSSMATCH)

## 2023-10-01 LAB — C-REACTIVE PROTEIN: CRP: 0.6 mg/dL (ref <1.0)

## 2023-10-01 LAB — FIBRINOGEN: Fibrinogen: 171 mg/dL — ABNORMAL LOW (ref 210–475)

## 2023-10-01 NOTE — Telephone Encounter (Signed)
 Patient will contact us  when she is ready to reschedule.

## 2023-10-01 NOTE — Telephone Encounter (Signed)
 PATIENT NAME: Brenda Conner  Attempted to contact patient for nutrition follow up. Unable to reach patient- left voicemail with RD contact information. Encouraged pt to contact RD at their earliest convenience. RD will attempt to contact patient another day/time.  Olam HERO. Pearline, RD, LDN

## 2023-10-01 NOTE — Telephone Encounter (Signed)
-----   Message from Roosevelt L sent at 09/23/2023  9:10 AM EDT ----- Regarding: FW: Reschedule appt  ----- Message ----- From: Duwaine Ahle, CMA Sent: 09/23/2023  12:00 AM EDT To: [facility] Front Desk Subject: Reschedule appt                                Patient cancelled via online portal stating I need to cancel this appointment as I will still be in [place] for the final phases of my CarT cell transplant. Attempt to reschedule.

## 2023-10-01 NOTE — Telephone Encounter (Signed)
 Spoke with patient and confirmed blood product appointment for tomorrow at 11:00 AM. Reminded patient to bring blue arm band. Patient voiced understanding.

## 2023-10-02 ENCOUNTER — Ambulatory Visit

## 2023-10-02 DIAGNOSIS — C9000 Multiple myeloma not having achieved remission: Secondary | ICD-10-CM

## 2023-10-02 MED ORDER — ACETAMINOPHEN 325 MG PO TABS
650.0000 mg | ORAL_TABLET | Freq: Once | ORAL | Status: AC
  Administered 2023-10-02: 650 mg via ORAL
  Filled 2023-10-02: qty 2

## 2023-10-02 MED ORDER — SODIUM CHLORIDE 0.9% IV SOLUTION
250.0000 mL | INTRAVENOUS | Status: DC
  Administered 2023-10-02: 100 mL via INTRAVENOUS

## 2023-10-02 MED ORDER — DIPHENHYDRAMINE HCL 25 MG PO CAPS
25.0000 mg | ORAL_CAPSULE | Freq: Once | ORAL | Status: AC
  Administered 2023-10-02: 25 mg via ORAL
  Filled 2023-10-02: qty 1

## 2023-10-02 NOTE — Patient Instructions (Signed)
 Blood Transfusion, Adult A blood transfusion is a procedure in which you receive blood or a type of blood cell (blood component) through an IV. You may need a blood transfusion when you have a low blood count, which is a low number of any blood cell. This may result from a bleeding disorder, illness, injury, or surgery. The blood may come from a donor, or you may be able to have your own blood collected and stored (autologous blood donation) before a planned surgery. The blood given in a transfusion may be made up of different blood components. You may receive: Red blood cells. These carry oxygen to the cells in the body. Platelets. These help your blood to clot. Plasma. This is the liquid part of your blood. It carries proteins and other substances throughout the body. White blood cells. These help you fight infections. If you have hemophilia or another clotting disorder, you may also receive other types of blood products. Depending on the type of blood product, this procedure may take 1-4 hours to complete. Tell a health care provider about: Any bleeding problems you have. Any previous reactions you have had during a blood transfusion. Any allergies you have. All medicines you are taking, including vitamins, herbs, eye drops, creams, and over-the-counter medicines. Any surgeries you have had. Any medical conditions you have. Whether you are pregnant or may be pregnant. What are the risks? Talk with your health care provider about risks. The most common problems include: A mild allergic reaction, such as red, swollen areas of skin (hives) and itching. Fever or chills. This may be the body's response to new blood cells received. This may occur during or up to 4 hours after the transfusion. More serious problems may include: A serious allergic reaction that causes difficulty breathing or swelling around the face and lips. Transfusion-associated circulatory overload (TACO), or too much fluid in  the lungs. This may cause breathing problems. Transfusion-related acute lung injury (TRALI), which causes breathing difficulty and low oxygen in the blood. This can occur within hours of the transfusion or several days later. Iron overload. This can happen after receiving many blood transfusions over a period of time. Infection or virus being transmitted. This is rare because donated blood is carefully tested before it is given. Hemolytic transfusion reaction. This is rare. It happens when the body's defense system (immune system)tries to attack the new blood cells. Symptoms may include fever, chills, nausea, low blood pressure, and low back or chest pain. Transfusion-associated graft-versus-host disease (TAGVHD). This is rare. It happens when donated cells attack the body's healthy tissues. What happens before the procedure? You will have a blood test to check your blood type. This test is done to know what kind of blood your body will accept and to match it to the donor blood. If you are going to have a planned surgery, you may be able to do an autologous blood donation. This may be done in case you need to have a transfusion. You will have your temperature, blood pressure, and pulse checked before the transfusion. If you have had an allergic reaction to a transfusion in the past, you may be given medicine to help prevent a reaction. This medicine may be given to you by mouth (orally) or through an IV. What happens during the procedure?  An IV will be inserted into one of your veins. The bag of blood will be attached to your IV. The blood will then enter through your vein. Your temperature, blood pressure,  and pulse will be monitored during the transfusion. This monitoring is done to detect early signs of a transfusion reaction. Tell your nurse right away if you have any of these symptoms during the transfusion: Shortness of breath or trouble breathing. Chest or back pain. Fever or  chills. Itching or hives. If you have any signs or symptoms of a reaction, your transfusion will be stopped and you may be given medicine. When the transfusion is complete, your IV will be removed. Pressure may be applied to the IV site for a few minutes. A bandage (dressing)will be applied. The procedure may vary among health care providers and hospitals. What happens after the procedure? Your temperature, blood pressure, pulse, breathing rate, and blood oxygen level will be monitored until you leave the hospital or clinic. Your blood may be tested to see how you have responded to the transfusion. You may be warmed with fluids or blankets to maintain a normal body temperature. If you receive your blood transfusion in an outpatient setting, you will be told whom to contact to report any reactions. Where to find more information Visit the [employer]: redcross.org Summary A blood transfusion is a procedure in which you receive blood or a type of blood cell (blood component) through an IV. The blood given in a transfusion may be made up of different blood components. You may receive red blood cells, platelets, plasma, or white blood cells depending on the condition treated. Your temperature, blood pressure, and pulse will be monitored before, during, and after the transfusion. After the transfusion, your blood may be tested to see how your body has responded. This information is not intended to replace advice given to you by your health care provider. Make sure you discuss any questions you have with your health care provider. Document Revised: 03/24/2021 Document Reviewed: 03/24/2021 Elsevier Patient Education  2024 [employer].

## 2023-10-03 ENCOUNTER — Telehealth: Payer: Self-pay | Admitting: Physician Assistant

## 2023-10-03 LAB — BPAM RBC
Blood Product Expiration Date: 202510122359
Blood Product Expiration Date: 202510092359
ISSUE DATE / TIME: 202509241108
Unit Type and Rh: 9500
Unit Type and Rh: 9500

## 2023-10-03 LAB — TYPE AND SCREEN
ABO/RH(D): B NEG
Antibody Screen: NEGATIVE
Unit division: 0
Unit division: 0

## 2023-10-03 NOTE — Telephone Encounter (Signed)
 Scheduled appointments with the patient and she is active on myChart.

## 2023-10-04 ENCOUNTER — Other Ambulatory Visit: Payer: Self-pay

## 2023-10-07 ENCOUNTER — Other Ambulatory Visit: Payer: Self-pay

## 2023-10-09 ENCOUNTER — Inpatient Hospital Stay

## 2023-10-14 ENCOUNTER — Other Ambulatory Visit: Payer: Self-pay | Admitting: Medical Oncology

## 2023-10-14 ENCOUNTER — Telehealth: Payer: Self-pay | Admitting: Medical Oncology

## 2023-10-14 NOTE — Telephone Encounter (Signed)
 Melissa , RN said that pt is seeing Dr Nichole this week.    If her hgb is still low this week Dr Nichole may want to arrange a transfusion with the [facility] . She will provider an update after he sees her.

## 2023-10-16 ENCOUNTER — Other Ambulatory Visit

## 2023-10-18 ENCOUNTER — Encounter: Payer: Self-pay | Admitting: Internal Medicine

## 2023-10-23 ENCOUNTER — Inpatient Hospital Stay

## 2023-10-23 ENCOUNTER — Inpatient Hospital Stay: Admitting: Physician Assistant

## 2023-10-30 ENCOUNTER — Inpatient Hospital Stay

## 2023-11-06 ENCOUNTER — Inpatient Hospital Stay

## 2023-11-06 ENCOUNTER — Inpatient Hospital Stay: Attending: Internal Medicine

## 2023-11-06 DIAGNOSIS — C9000 Multiple myeloma not having achieved remission: Secondary | ICD-10-CM | POA: Diagnosis present

## 2023-11-06 LAB — C-REACTIVE PROTEIN: CRP: 0.6 mg/dL (ref <1.0)

## 2023-11-06 LAB — CBC WITH DIFFERENTIAL (CANCER CENTER ONLY)
Abs Immature Granulocytes: 0.01 K/uL (ref 0.00–0.07)
Basophils Absolute: 0 K/uL (ref 0.0–0.1)
Basophils Relative: 1 %
Eosinophils Absolute: 0.1 K/uL (ref 0.0–0.5)
Eosinophils Relative: 2 %
HCT: 30.2 % — ABNORMAL LOW (ref 36.0–46.0)
Hemoglobin: 9.3 g/dL — ABNORMAL LOW (ref 12.0–15.0)
Immature Granulocytes: 0 %
Lymphocytes Relative: 7 %
Lymphs Abs: 0.2 K/uL — ABNORMAL LOW (ref 0.7–4.0)
MCH: 25.8 pg — ABNORMAL LOW (ref 26.0–34.0)
MCHC: 30.8 g/dL (ref 30.0–36.0)
MCV: 83.7 fL (ref 80.0–100.0)
Monocytes Absolute: 0.3 K/uL (ref 0.1–1.0)
Monocytes Relative: 9 %
Neutro Abs: 2.7 K/uL (ref 1.7–7.7)
Neutrophils Relative %: 81 %
Platelet Count: 98 K/uL — ABNORMAL LOW (ref 150–400)
RBC: 3.61 MIL/uL — ABNORMAL LOW (ref 3.87–5.11)
RDW: 14.4 % (ref 11.5–15.5)
WBC Count: 3.4 K/uL — ABNORMAL LOW (ref 4.0–10.5)
nRBC: 0 % (ref 0.0–0.2)

## 2023-11-06 LAB — CMP (CANCER CENTER ONLY)
ALT: 19 U/L (ref 0–44)
AST: 33 U/L (ref 15–41)
Albumin: 4.1 g/dL (ref 3.5–5.0)
Alkaline Phosphatase: 209 U/L — ABNORMAL HIGH (ref 38–126)
Anion gap: 7 (ref 5–15)
BUN: 10 mg/dL (ref 8–23)
CO2: 27 mmol/L (ref 22–32)
Calcium: 8.9 mg/dL (ref 8.9–10.3)
Chloride: 106 mmol/L (ref 98–111)
Creatinine: 0.74 mg/dL (ref 0.44–1.00)
GFR, Estimated: >60 mL/min (ref >60)
Glucose, Bld: 127 mg/dL — ABNORMAL HIGH (ref 70–99)
Potassium: 4 mmol/L (ref 3.5–5.1)
Sodium: 140 mmol/L (ref 135–145)
Total Bilirubin: 0.5 mg/dL (ref 0.0–1.2)
Total Protein: 6.6 g/dL (ref 6.5–8.1)

## 2023-11-06 LAB — FERRITIN: Ferritin: 23 ng/mL (ref 11–307)

## 2023-11-06 LAB — SAMPLE TO BLOOD BANK

## 2023-11-06 LAB — FIBRINOGEN: Fibrinogen: 445 mg/dL (ref 210–475)

## 2023-11-08 ENCOUNTER — Encounter (HOSPITAL_BASED_OUTPATIENT_CLINIC_OR_DEPARTMENT_OTHER): Payer: Self-pay | Admitting: Obstetrics & Gynecology

## 2023-11-13 ENCOUNTER — Inpatient Hospital Stay: Attending: Internal Medicine

## 2023-11-13 ENCOUNTER — Inpatient Hospital Stay

## 2023-11-13 ENCOUNTER — Other Ambulatory Visit: Payer: Self-pay

## 2023-11-13 VITALS — BP 135/71 | HR 64 | Temp 98.7°F | Resp 16 | Wt 251.1 lb

## 2023-11-13 DIAGNOSIS — Z79899 Other long term (current) drug therapy: Secondary | ICD-10-CM | POA: Insufficient documentation

## 2023-11-13 DIAGNOSIS — C9000 Multiple myeloma not having achieved remission: Secondary | ICD-10-CM

## 2023-11-13 DIAGNOSIS — Z95828 Presence of other vascular implants and grafts: Secondary | ICD-10-CM

## 2023-11-13 DIAGNOSIS — T451X5A Adverse effect of antineoplastic and immunosuppressive drugs, initial encounter: Secondary | ICD-10-CM

## 2023-11-13 LAB — SAMPLE TO BLOOD BANK

## 2023-11-13 LAB — CBC WITH DIFFERENTIAL (CANCER CENTER ONLY)
Abs Immature Granulocytes: 0.01 K/uL (ref 0.00–0.07)
Basophils Absolute: 0 K/uL (ref 0.0–0.1)
Basophils Relative: 1 %
Eosinophils Absolute: 0.1 K/uL (ref 0.0–0.5)
Eosinophils Relative: 3 %
HCT: 32.4 % — ABNORMAL LOW (ref 36.0–46.0)
Hemoglobin: 9.9 g/dL — ABNORMAL LOW (ref 12.0–15.0)
Immature Granulocytes: 0 %
Lymphocytes Relative: 5 %
Lymphs Abs: 0.2 K/uL — ABNORMAL LOW (ref 0.7–4.0)
MCH: 25.2 pg — ABNORMAL LOW (ref 26.0–34.0)
MCHC: 30.6 g/dL (ref 30.0–36.0)
MCV: 82.4 fL (ref 80.0–100.0)
Monocytes Absolute: 0.3 K/uL (ref 0.1–1.0)
Monocytes Relative: 8 %
Neutro Abs: 2.5 K/uL (ref 1.7–7.7)
Neutrophils Relative %: 83 %
Platelet Count: 106 K/uL — ABNORMAL LOW (ref 150–400)
RBC: 3.93 MIL/uL (ref 3.87–5.11)
RDW: 14.5 % (ref 11.5–15.5)
WBC Count: 3.1 K/uL — ABNORMAL LOW (ref 4.0–10.5)
nRBC: 0 % (ref 0.0–0.2)

## 2023-11-13 LAB — CMP (CANCER CENTER ONLY)
ALT: 23 U/L (ref 0–44)
AST: 39 U/L (ref 15–41)
Albumin: 4.2 g/dL (ref 3.5–5.0)
Alkaline Phosphatase: 202 U/L — ABNORMAL HIGH (ref 38–126)
Anion gap: 8 (ref 5–15)
BUN: 10 mg/dL (ref 8–23)
CO2: 25 mmol/L (ref 22–32)
Calcium: 9.1 mg/dL (ref 8.9–10.3)
Chloride: 105 mmol/L (ref 98–111)
Creatinine: 0.75 mg/dL (ref 0.44–1.00)
GFR, Estimated: >60 mL/min (ref >60)
Glucose, Bld: 157 mg/dL — ABNORMAL HIGH (ref 70–99)
Potassium: 4.2 mmol/L (ref 3.5–5.1)
Sodium: 138 mmol/L (ref 135–145)
Total Bilirubin: 0.5 mg/dL (ref 0.0–1.2)
Total Protein: 6.7 g/dL (ref 6.5–8.1)

## 2023-11-13 LAB — FIBRINOGEN: Fibrinogen: 444 mg/dL (ref 210–475)

## 2023-11-13 LAB — FERRITIN: Ferritin: 22 ng/mL (ref 11–307)

## 2023-11-13 LAB — C-REACTIVE PROTEIN: CRP: 0.6 mg/dL (ref <1.0)

## 2023-11-13 MED ORDER — IMMUNE GLOBULIN (HUMAN) 10 GM/100ML IV SOLN
400.0000 mg/kg | Freq: Once | INTRAVENOUS | Status: AC
  Administered 2023-11-13: 45 g via INTRAVENOUS
  Filled 2023-11-13: qty 50

## 2023-11-13 MED ORDER — ACETAMINOPHEN 325 MG PO TABS
650.0000 mg | ORAL_TABLET | Freq: Once | ORAL | Status: AC
  Administered 2023-11-13: 650 mg via ORAL
  Filled 2023-11-13: qty 2

## 2023-11-13 MED ORDER — DIPHENHYDRAMINE HCL 25 MG PO CAPS
25.0000 mg | ORAL_CAPSULE | Freq: Once | ORAL | Status: AC
  Administered 2023-11-13: 25 mg via ORAL
  Filled 2023-11-13: qty 1

## 2023-11-13 MED ORDER — DEXTROSE 5 % IV SOLN: INTRAVENOUS | Status: DC

## 2023-11-13 NOTE — Progress Notes (Signed)
 Patient tolerated her IVIG well- no s/s of reaction. Stayed for her 30 minute observation. VSS-  BP 135/71 (BP Location: Right Arm, Patient Position: Sitting)   Pulse 64   Temp 98.7 F (37.1 C) (Oral)   Resp 16   Wt 251 lb 2 oz (113.9 kg)   SpO2 100%   BMI 39.33 kg/m   Ambulatory to the lobby.

## 2023-11-13 NOTE — Patient Instructions (Signed)
 [facility] - A DEPT OF [place]  Discharge Instructions: Thank you for choosing [facility] to provide your oncology and hematology care.   If you have a lab appointment with the Cancer Center, please go directly to the Cancer Center and check in at the registration area.   Wear comfortable clothing and clothing appropriate for easy access to any Portacath or PICC line.   We strive to give you quality time with your provider. You may need to reschedule your appointment if you arrive late (15 or more minutes).  Arriving late affects you and other patients whose appointments are after yours.  Also, if you miss three or more appointments without notifying the office, you may be dismissed from the clinic at the provider's discretion.      For prescription refill requests, have your pharmacy contact our office and allow 72 hours for refills to be completed.    Today you received the following chemotherapy and/or immunotherapy agents: Privigen (IVIG- IV Immune Globulin)      To help prevent nausea and vomiting after your treatment, we encourage you to take your nausea medication as directed.  BELOW ARE SYMPTOMS THAT SHOULD BE REPORTED IMMEDIATELY: *FEVER GREATER THAN 100.4 F (38 C) OR HIGHER *CHILLS OR SWEATING *NAUSEA AND VOMITING THAT IS NOT CONTROLLED WITH YOUR NAUSEA MEDICATION *UNUSUAL SHORTNESS OF BREATH *UNUSUAL BRUISING OR BLEEDING *URINARY PROBLEMS (pain or burning when urinating, or frequent urination) *BOWEL PROBLEMS (unusual diarrhea, constipation, pain near the anus) TENDERNESS IN MOUTH AND THROAT WITH OR WITHOUT PRESENCE OF ULCERS (sore throat, sores in mouth, or a toothache) UNUSUAL RASH, SWELLING OR PAIN  UNUSUAL VAGINAL DISCHARGE OR ITCHING   Items with * indicate a potential emergency and should be followed up as soon as possible or go to the Emergency Department if any problems should occur.  Please show the CHEMOTHERAPY  ALERT CARD or IMMUNOTHERAPY ALERT CARD at check-in to the Emergency Department and triage nurse.  Should you have questions after your visit or need to cancel or reschedule your appointment, please contact [facility] - A DEPT OF [facility]  Dept: [phone]  and follow the prompts.  Office hours are 8:00 a.m. to 4:30 p.m. Monday - Friday. Please note that voicemails left after 4:00 p.m. may not be returned until the following business day.  We are closed weekends and major holidays. You have access to a nurse at all times for urgent questions. Please call the main number to the clinic Dept: [phone] and follow the prompts.   For any non-urgent questions, you may also contact your provider using MyChart. We now offer e-Visits for anyone 47 and older to request care online for non-urgent symptoms. For details visit [email or web address].   Also download the MyChart app! Go to the app store, search MyChart, open the app, select , and log in with your MyChart username and password.  Immune Globulin Injection What is this medication? IMMUNE GLOBULIN (im MUNE GLOB yoo lin) treats many immune system conditions. It works by designer, multimedia extra antibodies. Antibodies are proteins made by the immune system that help protect the body. This medicine may be used for other purposes; ask your health care provider or pharmacist if you have questions. COMMON BRAND NAME(S): ASCENIV, Baygam, BIVIGAM, Carimune, Carimune NF, cutaquig, Cuvitru, Flebogamma, Flebogamma DIF, GamaSTAN, GamaSTAN S/D, Gamimune N, Gammagard, Gammagard S/D, Gammaked, Gammaplex, Gammar-P IV, Gamunex, Gamunex-C, Hizentra,  Iveegam, Iveegam EN, Octagam, Panglobulin, Panglobulin NF, panzyga, Polygam S/D, Privigen, Sandoglobulin, Venoglobulin-S, Vigam, Vivaglobulin, Xembify What should I tell my care team before I take this medication? They need to know if you have any of these conditions: Blood  clotting disorder Condition where you have excess fluid in your body, such as heart failure or edema Dehydration Diabetes Have had blood clots Heart disease Immune system conditions Kidney disease Low levels of IgA Recent or upcoming vaccine An unusual or allergic reaction to immune globulin, other medications, foods, dyes, or preservatives Pregnant or trying to get pregnant Breastfeeding How should I use this medication? This medication is infused into a vein or under the skin. It may also be injected into a muscle. It is usually given by your care team in a hospital or clinic setting. It may also be given at home. If you get this medication at home, you will be taught how to prepare and give it. Take it as directed on the prescription label. Keep taking it unless your care team tells you to stop. It is important that you put your used needles and syringes in a special sharps container. Do not put them in a trash can. If you do not have a sharps container, call your pharmacist or care team to get one. Talk to your care team about the use of this medication in children. While it may be given to children for selected conditions, precautions do apply. Overdosage: If you think you have taken too much of this medicine contact a poison control center or emergency room at once. NOTE: This medicine is only for you. Do not share this medicine with others. What if I miss a dose? If you get this medication at the hospital or clinic: It is important not to miss your dose. Call your care team if you are unable to keep an appointment. If you give yourself this medication at home: If you miss a dose, take it as soon as you can. Then continue your normal schedule. If it is almost time for your next dose, take only that dose. Do not take double or extra doses. Call your care team with questions. What may interact with this medication? Live virus vaccines This list may not describe all possible interactions.  Give your health care provider a list of all the medicines, herbs, non-prescription drugs, or dietary supplements you use. Also tell them if you smoke, drink alcohol, or use illegal drugs. Some items may interact with your medicine. What should I watch for while using this medication? Your condition will be monitored carefully while you are receiving this medication. Tell your care team if your symptoms do not start to get better or if they get worse. You may need blood work done while you are taking this medication. This medication increases the risk of blood clots. People with heart, blood vessel, or blood clotting conditions are more likely to develop a blood clot. Other risk factors include advanced age, estrogen use, tobacco use, lack of movement, and being overweight. This medication can decrease the response to a vaccine. If you need to get vaccinated, tell your care team if you have received this medication within the last year. Extra booster doses may be needed. Talk to your care team to see if a different vaccination schedule is needed. This medication is made from donated human blood. There is a small risk it may contain bacteria or viruses, such as hepatitis or HIV. All products are processed to kill most  bacteria and viruses. Talk to your care team if you have questions about the risk of infection. If you have diabetes, talk to your care team about which device you should use to check your blood sugar. This medication may cause some devices to report falsely high blood sugar levels. This may cause you to react by not treating a low blood sugar level or by giving an insulin dose that was not needed. This can cause severe low blood sugar levels. What side effects may I notice from receiving this medication? Side effects that you should report to your care team as soon as possible: Allergic reactions--skin rash, itching, hives, swelling of the face, lips, tongue, or throat Blood clot--pain,  swelling, or warmth in the leg, shortness of breath, chest pain Fever, neck pain or stiffness, sensitivity to light, headache, nausea, vomiting, confusion, which may be signs of meningitis Hemolytic anemia--unusual weakness or fatigue, dizziness, headache, trouble breathing, dark urine, yellowing skin or eyes Kidney injury--decrease in the amount of urine, swelling of the ankles, hands, or feet Low sodium level--muscle weakness, fatigue, dizziness, headache, confusion Shortness of breath or trouble breathing, cough, unusual weakness or fatigue, blue skin or lips Side effects that usually do not require medical attention (report these to your care team if they continue or are bothersome): Chills Diarrhea Fever Headache Nausea This list may not describe all possible side effects. Call your doctor for medical advice about side effects. You may report side effects to FDA at 1-800-FDA-1088. Where should I keep my medication? Keep out of the reach of children and pets. You will be instructed on how to store this medication. Get rid of any unused medication after the expiration date. To get rid of medications that are no longer needed or have expired: Take the medication to a medication take-back program. Check with your pharmacy or law enforcement to find a location. If you cannot return the medication, ask your pharmacist or care team how to get rid of this medication safely. NOTE: This sheet is a summary. It may not cover all possible information. If you have questions about this medicine, talk to your doctor, pharmacist, or health care provider.  2025 Elsevier/Gold Standard (2023-03-11 00:00:00)

## 2023-11-13 NOTE — Progress Notes (Signed)
 Pt sent to lab due to Fibrinogen  study being collected through peripheral.

## 2023-11-26 ENCOUNTER — Encounter: Payer: Self-pay | Admitting: Internal Medicine

## 2023-12-02 ENCOUNTER — Other Ambulatory Visit: Payer: Self-pay | Admitting: *Deleted

## 2023-12-02 ENCOUNTER — Encounter: Payer: Self-pay | Admitting: Internal Medicine

## 2023-12-02 DIAGNOSIS — C9000 Multiple myeloma not having achieved remission: Secondary | ICD-10-CM

## 2023-12-02 MED ORDER — ACYCLOVIR 400 MG PO TABS: 400.0000 mg | ORAL_TABLET | Freq: Every day | ORAL | 3 refills | Status: DC

## 2023-12-10 ENCOUNTER — Inpatient Hospital Stay: Attending: Internal Medicine

## 2023-12-10 ENCOUNTER — Inpatient Hospital Stay: Admitting: Internal Medicine

## 2023-12-10 ENCOUNTER — Inpatient Hospital Stay

## 2023-12-10 ENCOUNTER — Other Ambulatory Visit: Payer: Self-pay

## 2023-12-10 VITALS — BP 128/72 | HR 68 | Temp 98.0°F | Resp 18 | Wt 254.5 lb

## 2023-12-10 DIAGNOSIS — C9000 Multiple myeloma not having achieved remission: Secondary | ICD-10-CM

## 2023-12-10 DIAGNOSIS — Z79899 Other long term (current) drug therapy: Secondary | ICD-10-CM | POA: Diagnosis not present

## 2023-12-10 DIAGNOSIS — T451X5A Adverse effect of antineoplastic and immunosuppressive drugs, initial encounter: Secondary | ICD-10-CM

## 2023-12-10 DIAGNOSIS — Z95828 Presence of other vascular implants and grafts: Secondary | ICD-10-CM

## 2023-12-10 LAB — CMP (CANCER CENTER ONLY)
ALT: 35 U/L (ref 0–44)
AST: 65 U/L — ABNORMAL HIGH (ref 15–41)
Albumin: 4 g/dL (ref 3.5–5.0)
Alkaline Phosphatase: 273 U/L — ABNORMAL HIGH (ref 38–126)
Anion gap: 11 (ref 5–15)
BUN: 12 mg/dL (ref 8–23)
CO2: 24 mmol/L (ref 22–32)
Calcium: 8.9 mg/dL (ref 8.9–10.3)
Chloride: 103 mmol/L (ref 98–111)
Creatinine: 0.81 mg/dL (ref 0.44–1.00)
GFR, Estimated: >60 mL/min (ref >60)
Glucose, Bld: 132 mg/dL — ABNORMAL HIGH (ref 70–99)
Potassium: 4.1 mmol/L (ref 3.5–5.1)
Sodium: 137 mmol/L (ref 135–145)
Total Bilirubin: 0.4 mg/dL (ref 0.0–1.2)
Total Protein: 6.2 g/dL — ABNORMAL LOW (ref 6.5–8.1)

## 2023-12-10 LAB — CBC WITH DIFFERENTIAL (CANCER CENTER ONLY)
Abs Immature Granulocytes: 0 K/uL (ref 0.00–0.07)
Basophils Absolute: 0 K/uL (ref 0.0–0.1)
Basophils Relative: 1 %
Eosinophils Absolute: 0.1 K/uL (ref 0.0–0.5)
Eosinophils Relative: 3 %
HCT: 28.4 % — ABNORMAL LOW (ref 36.0–46.0)
Hemoglobin: 8.6 g/dL — ABNORMAL LOW (ref 12.0–15.0)
Immature Granulocytes: 0 %
Lymphocytes Relative: 8 %
Lymphs Abs: 0.2 K/uL — ABNORMAL LOW (ref 0.7–4.0)
MCH: 24.4 pg — ABNORMAL LOW (ref 26.0–34.0)
MCHC: 30.3 g/dL (ref 30.0–36.0)
MCV: 80.7 fL (ref 80.0–100.0)
Monocytes Absolute: 0.2 K/uL (ref 0.1–1.0)
Monocytes Relative: 10 %
Neutro Abs: 1.9 K/uL (ref 1.7–7.7)
Neutrophils Relative %: 78 %
Platelet Count: 91 K/uL — ABNORMAL LOW (ref 150–400)
RBC: 3.52 MIL/uL — ABNORMAL LOW (ref 3.87–5.11)
RDW: 15.8 % — ABNORMAL HIGH (ref 11.5–15.5)
WBC Count: 2.4 K/uL — ABNORMAL LOW (ref 4.0–10.5)
nRBC: 0 % (ref 0.0–0.2)

## 2023-12-10 MED ORDER — DIPHENHYDRAMINE HCL 25 MG PO CAPS
25.0000 mg | ORAL_CAPSULE | Freq: Once | ORAL | Status: AC
  Administered 2023-12-10: 25 mg via ORAL
  Filled 2023-12-10: qty 1

## 2023-12-10 MED ORDER — IMMUNE GLOBULIN (HUMAN) 10 GM/100ML IV SOLN
45.0000 g | Freq: Once | INTRAVENOUS | Status: AC
  Administered 2023-12-10: 45 g via INTRAVENOUS
  Filled 2023-12-10: qty 450

## 2023-12-10 MED ORDER — ACETAMINOPHEN 325 MG PO TABS
650.0000 mg | ORAL_TABLET | Freq: Once | ORAL | Status: AC
  Administered 2023-12-10: 650 mg via ORAL
  Filled 2023-12-10: qty 2

## 2023-12-10 MED ORDER — DEXTROSE 5 % IV SOLN: INTRAVENOUS | Status: DC

## 2023-12-10 NOTE — Patient Instructions (Signed)
 Immune Globulin  Injection What is this medication? IMMUNE GLOBULIN  (im MUNE GLOB yoo lin) treats many immune system conditions. It works by Designer, multimedia extra antibodies. Antibodies are proteins made by the immune system that help protect the body. This medicine may be used for other purposes; ask your health care provider or pharmacist if you have questions. COMMON BRAND NAME(S): ASCENIV, Baygam, BIVIGAM, Carimune, Carimune NF, cutaquig, Cuvitru, Flebogamma, Flebogamma DIF, GamaSTAN, GamaSTAN S/D, Gamimune N, Gammagard, Gammagard S/D, Gammaked, Gammaplex, Gammar-P IV, Gamunex, Gamunex-C, Hizentra, Iveegam, Iveegam EN, Octagam, Panglobulin, Panglobulin NF, panzyga, Polygam S/D, Privigen , Sandoglobulin, Venoglobulin-S, Vigam, Vivaglobulin, Xembify What should I tell my care team before I take this medication? They need to know if you have any of these conditions: Blood clotting disorder Condition where you have excess fluid in your body, such as heart failure or edema Dehydration Diabetes Have had blood clots Heart disease Immune system conditions Kidney disease Low levels of IgA Recent or upcoming vaccine An unusual or allergic reaction to immune globulin , other medications, foods, dyes, or preservatives Pregnant or trying to get pregnant Breastfeeding How should I use this medication? This medication is infused into a vein or under the skin. It may also be injected into a muscle. It is usually given by your care team in a hospital or clinic setting. It may also be given at home. If you get this medication at home, you will be taught how to prepare and give it. Take it as directed on the prescription label. Keep taking it unless your care team tells you to stop. It is important that you put your used needles and syringes in a special sharps container. Do not put them in a trash can. If you do not have a sharps container, call your pharmacist or care team to get one. Talk to your care team  about the use of this medication in children. While it may be given to children for selected conditions, precautions do apply. Overdosage: If you think you have taken too much of this medicine contact a poison control center or emergency room at once. NOTE: This medicine is only for you. Do not share this medicine with others. What if I miss a dose? If you get this medication at the hospital or clinic: It is important not to miss your dose. Call your care team if you are unable to keep an appointment. If you give yourself this medication at home: If you miss a dose, take it as soon as you can. Then continue your normal schedule. If it is almost time for your next dose, take only that dose. Do not take double or extra doses. Call your care team with questions. What may interact with this medication? Live virus vaccines This list may not describe all possible interactions. Give your health care provider a list of all the medicines, herbs, non-prescription drugs, or dietary supplements you use. Also tell them if you smoke, drink alcohol , or use illegal drugs. Some items may interact with your medicine. What should I watch for while using this medication? Your condition will be monitored carefully while you are receiving this medication. Tell your care team if your symptoms do not start to get better or if they get worse. You may need blood work done while you are taking this medication. This medication increases the risk of blood clots. People with heart, blood vessel, or blood clotting conditions are more likely to develop a blood clot. Other risk factors include advanced age, estrogen use, tobacco  use, lack of movement, and being overweight. This medication can decrease the response to a vaccine. If you need to get vaccinated, tell your care team if you have received this medication within the last year. Extra booster doses may be needed. Talk to your care team to see if a different vaccination schedule  is needed. This medication is made from donated human blood. There is a small risk it may contain bacteria or viruses, such as hepatitis or HIV. All products are processed to kill most bacteria and viruses. Talk to your care team if you have questions about the risk of infection. If you have diabetes, talk to your care team about which device you should use to check your blood sugar. This medication may cause some devices to report falsely high blood sugar levels. This may cause you to react by not treating a low blood sugar level or by giving an insulin  dose that was not needed. This can cause severe low blood sugar levels. What side effects may I notice from receiving this medication? Side effects that you should report to your care team as soon as possible: Allergic reactions--skin rash, itching, hives, swelling of the face, lips, tongue, or throat Blood clot--pain, swelling, or warmth in the leg, shortness of breath, chest pain Fever, neck pain or stiffness, sensitivity to light, headache, nausea, vomiting, confusion, which may be signs of meningitis Hemolytic anemia--unusual weakness or fatigue, dizziness, headache, trouble breathing, dark urine, yellowing skin or eyes Kidney injury--decrease in the amount of urine, swelling of the ankles, hands, or feet Low sodium level--muscle weakness, fatigue, dizziness, headache, confusion Shortness of breath or trouble breathing, cough, unusual weakness or fatigue, blue skin or lips Side effects that usually do not require medical attention (report these to your care team if they continue or are bothersome): Chills Diarrhea Fever Headache Nausea This list may not describe all possible side effects. Call your doctor for medical advice about side effects. You may report side effects to FDA at 1-800-FDA-1088. Where should I keep my medication? Keep out of the reach of children and pets. You will be instructed on how to store this medication. Get rid of  any unused medication after the expiration date. To get rid of medications that are no longer needed or have expired: Take the medication to a medication take-back program. Check with your pharmacy or law enforcement to find a location. If you cannot return the medication, ask your pharmacist or care team how to get rid of this medication safely. NOTE: This sheet is a summary. It may not cover all possible information. If you have questions about this medicine, talk to your doctor, pharmacist, or health care provider.  2025 Elsevier/Gold Standard (2023-03-11 00:00:00)

## 2023-12-11 LAB — IGG, IGA, IGM
IgA: <5 mg/dL — ABNORMAL LOW (ref 87–352)
IgG (Immunoglobin G), Serum: 564 mg/dL — ABNORMAL LOW (ref 586–1602)
IgM (Immunoglobulin M), Srm: 23 mg/dL — ABNORMAL LOW (ref 26–217)

## 2024-01-09 NOTE — Progress Notes (Unsigned)
 [facility] OFFICE PROGRESS NOTE  Brenda Alm PARAS, MD [address] [place] [address]  DIAGNOSIS: IgM lambda multiple myeloma with neuropathy   PRIOR THERAPY: 1) Velcade  1.3 MG/M2 subcutaneously with Decadron  40 mg by mouth on a weekly basis. First cycle 11/24/2013. She status post 31 weekly doses of treatment. 2) Velcade  1.3 MG/M2 subcutaneously and weekly basis with Decadron  40 mg by mouth weekly. First dose 02/01/2015. Status post 28 cycles. 3) Revlimid  25 mg by mouth daily for 21 days every 4 weeks with weekly Decadron  20 mg. started in 11/27/2015. Status post 3 cycles discontinued secondary to lack of response. 4) Systemic treatment with Velcade  1.3 MG/KG weekly, Revlimid  25 mg by mouth daily for 21 days every 4 weeks in addition to Decadron  20 mg by mouth weekly. First dose 03/06/2016. Status post 42 cycles.  She has a break off treatment from June 2021 until July 2022. 5) Second line treatment with daratumumab , Pomalyst  3 mg for 21 days every 4 weeks as well as Decadron  20 mg p.o. weekly.  First dose 09/26/2022.  Status post 1 cycle and currently receiving day 15 of cycle #2. 6) s/p cilta-cel 08/21/23 anti-BCMA CAR T   CURRENT THERAPY:  IVIg 0.4 g/kg every 4 weeks beginning 09/19/23. Most recent given on 12/10/23  INTERVAL HISTORY: Brenda Conner 64 y.o. female returns to the clinic today for a follow-up visit.  The patient was seen in clinic on 10/01/2023.  She has been followed for her multiple myeloma.  She underwent CAR-T cell therapy in August 2025.  She receives monthly IVIG.  This was most recently given on 12/10/23.   She previously had iron deficiency anemia and they recommended EGD and colonoscopy.    Her sister passed away unexpectedly a few months ago.  Therefore the patient has been busy and she has not had a chance to see gastroenterology.  She states that she will call them.  She also was taking iron tablets in the past but has not been doing so on  a regular basis recently.  She will resume this.   She denies melena, hematochezia, abnormal bleeding, or bruising. She intermittently takes oral iron supplements. She has chronic diarrhea at baseline, without recent nausea or vomiting.   She experiences intermittent dyspnea, which she attributes primarily to anxiety and panic attacks, managed with daily alprazolam .  Overall, she reports good energy. She denies cough, fevers, chills, or recent infections. She received influenza and COVID-19 vaccinations in November.  She receives regular IVIG infusions and reports no recent infections, fevers, or illnesses. She wears compression stockings, which she feels improve her leg symptoms and energy.  She is supposed to undergo monthly IVIG in addition to blood work with CBC, CMP, IgG/IgM, IgA, SPEP/IFE, serum free light chains at those visits.   She is here today for evaluation and repeat blood work.    MEDICAL HISTORY: Past Medical History:  Diagnosis Date   Acid reflux    Anemia    Anxiety    Asthma    Cancer (HCC)    waldenstroms/ macroglobinulemia   Depression    Depression    Diabetes mellitus without complication (HCC)    Dysuria 02/28/2016   Gallstones    Heart palpitations    Hypercholesteremia    Hypertension    Hypothyroidism    Macroglobulinemia (HCC)    ? POEMS syndrome   Monoclonal gammopathy of unknown significance (MGUS)    Multiple myeloma (HCC)  Obesity    POEMS syndrome    PONV (postoperative nausea and vomiting)    Sleep apnea    CPAP at bedtime    ALLERGIES:  is allergic to codeine, hydrocodone , lortab [hydrocodone -acetaminophen ], onion, shellfish allergy, and amoxicillin.  MEDICATIONS:  Current Outpatient Medications  Medication Sig Dispense Refill   ACCU-CHEK AVIVA PLUS test strip USE TO CHECK SUGAR TWICE A DAY 90     acyclovir  (ZOVIRAX ) 400 MG tablet Take 1 tablet (400 mg total) by mouth daily. 90 tablet 3   allopurinol (ZYLOPRIM) 300 MG tablet  Take by mouth.     ALPRAZolam  (XANAX ) 0.5 MG tablet Take 1 tablet (0.5 mg total) by mouth at bedtime as needed for anxiety. 30 tablet 1   aspirin 81 MG chewable tablet Chew by mouth daily.     Azelastine HCl 0.15 % SOLN as needed.     azithromycin  (ZITHROMAX ) 250 MG tablet Use as instructed 6 each 0   B-D UF III MINI PEN NEEDLES 31G X 5 MM MISC      Blood Glucose Monitoring Suppl (ONE TOUCH ULTRA MINI) W/DEVICE KIT See admin instructions. Reported on 01/25/2015  0   cetirizine (ZYRTEC ALLERGY) 10 MG tablet Take 1 tablet by mouth daily.     Continuous Glucose Sensor (FREESTYLE LIBRE 3 SENSOR) MISC USE 1 SENSOR EVERY 14 DAYS     dexamethasone  (DECADRON ) 4 MG tablet Take 5 tablets (20 mg total) by mouth once a week. Take the day after darzalex  faspro. Take with breakfast. 20 tablet 11   dexamethasone  (DECADRON ) 4 MG tablet Take 5 tablets (20mg ) on days 1, 8, 15 and 22 every 28 days 20 tablet 4   DiphenhydrAMINE  HCl (BENADRYL  ALLERGY PO) Take 1 Dose by mouth daily at 6 (six) AM.     fexofenadine (ALLEGRA ALLERGY) 180 MG tablet Take 1 tablet by mouth daily.     fluconazole (DIFLUCAN) 200 MG tablet Take 400 mg by mouth daily.     Fluticasone-Salmeterol (ADVAIR) 100-50 MCG/DOSE AEPB Inhale 2 puffs into the lungs every 12 (twelve) hours.     glucosamine-chondroitin 500-400 MG tablet Take 1 tablet by mouth daily.     ibuprofen (ADVIL,MOTRIN) 100 MG tablet Take 100 mg by mouth every 6 (six) hours as needed. Reported on 05/31/2015     Insulin Disposable Pump (OMNIPOD 5 DEXG7G6 INTRO GEN 5) KIT every 48 hr; Duration: 365 days     Insulin Disposable Pump (OMNIPOD 5 DEXG7G6 PODS GEN 5) MISC SMARTSIG:SUB-Q Every Other Day     Insulin Disposable Pump (OMNIPOD 5 DEXG7G6 PODS GEN 5) MISC every 48 hours     Insulin Disposable Pump (OMNIPOD 5 DEXG7G6 PODS GEN 5) MISC EVERY 48 HOURS     Insulin Glargine (BASAGLAR KWIKPEN) 100 UNIT/ML Inject 20 Units into the skin at bedtime.     insulin glargine-yfgn (SEMGLEE) 100  UNIT/ML Pen Inject into the skin 2 (two) times daily. Lantus was out of stock, replacement     levETIRAcetam (KEPPRA) 500 MG tablet Take by mouth.     levofloxacin (LEVAQUIN) 500 MG tablet Take 500 mg by mouth daily.     levothyroxine (SYNTHROID) 137 MCG tablet Take 137 mcg by mouth daily.     lidocaine  (XYLOCAINE ) 2 % solution      lidocaine -prilocaine  (EMLA ) cream Apply 1 Application topically as needed. 30 g 2   lisinopril (PRINIVIL,ZESTRIL) 10 MG tablet Take 10 mg by mouth daily. auth number 05/29/2018 [id number]  3   loperamide (IMODIUM) 2 MG capsule  Take 2 mg by mouth as needed for diarrhea or loose stools.     magic mouthwash w/lidocaine  SOLN Take 5 mLs by mouth 4 (four) times daily as needed for mouth pain. Swish, Gargle, and spit 240 mL 1   Melatonin 5 MG CAPS Take 1 capsule by mouth at bedtime.     metFORMIN (GLUCOPHAGE-XR) 500 MG 24 hr tablet Take 500 mg by mouth daily.     NOVOLOG FLEXPEN 100 UNIT/ML FlexPen Inject 15 Units into the skin in the morning, at noon, and at bedtime. Per Sliding Scale     OLANZapine  (ZYPREXA ) 5 MG tablet TAKE ONE TABLET THE DAY BEFORE, THE DAY OF, AND THE DAY AFTER SELINEXOR . 90 tablet 1   omeprazole (PRILOSEC) 20 MG capsule Take 20 mg by mouth 2 (two) times daily.     ondansetron  (ZOFRAN ) 8 MG tablet Take 1 tablet (8 mg total) by mouth every 8 (eight) hours as needed for nausea or vomiting. (Patient not taking: Reported on 12/10/2023) 90 tablet 1   potassium chloride  SA (KLOR-CON  M) 20 MEQ tablet Take 20 mEq by mouth daily.     pravastatin (PRAVACHOL) 80 MG tablet Take 80 mg by mouth daily.     PROAIR HFA 108 (90 Base) MCG/ACT inhaler Reported on 06/21/2015  1   prochlorperazine  (COMPAZINE ) 10 MG tablet Take 1 tablet (10 mg total) by mouth every 6 (six) hours as needed for nausea or vomiting. (Patient not taking: Reported on 12/10/2023) 30 tablet 1   selinexor  (XPOVIO , 60 MG ONCE WEEKLY,) Therapy Pack (60 mg once weekly) Take 1 tablet (60 mg total) by mouth once a  week. 4 tablet 0   sertraline  (ZOLOFT ) 50 MG tablet TAKE 3 TABLETS BY MOUTH EVERY DAY 270 tablet 1   sulfamethoxazole-trimethoprim (BACTRIM) 400-80 MG tablet Take 1 tablet by mouth daily.     verapamil (CALAN-SR) 240 MG CR tablet Take 240 mg by mouth 2 (two) times daily.     No current facility-administered medications for this visit.    SURGICAL HISTORY:  Past Surgical History:  Procedure Laterality Date   ABLATION  09/09/2007   HTA and polyp resection   BONE MARROW BIOPSY  04/08/2012   BONE MARROW BIOPSY  01/2020   CATARACT EXTRACTION, BILATERAL Bilateral 02/2022   FOOT SURGERY Bilateral 01/09/1996   small toe   IR IMAGING GUIDED PORT INSERTION  03/08/2023   KNEE ARTHROSCOPY W/ MENISCAL REPAIR Bilateral 10/10 ; 3/11   LAPAROSCOPIC CHOLECYSTECTOMY  01/09/1995   LUMBAR DISC SURGERY  03/08/2004   herniation, L4-L5   NASAL SINUS SURGERY  01/08/2002   UTERINE FIBROID EMBOLIZATION  2009   HTA and polyp resection    REVIEW OF SYSTEMS:   Review of Systems  Constitutional: Stable fatigue. Negative for appetite change, chills, fever and unexpected weight change.  HENT: Negative for mouth sores, nosebleeds, sore throat and trouble swallowing.   Eyes: Negative for eye problems and icterus.  Respiratory: Occasional shortness of breath. Negative for cough, hemoptysis,  and wheezing.   Cardiovascular: Negative for chest pain and leg swelling.  Gastrointestinal: Negative for abdominal pain, constipation, diarrhea, nausea and vomiting.  Genitourinary: Negative for bladder incontinence, difficulty urinating, dysuria, frequency and hematuria.   Musculoskeletal: Negative for back pain, gait problem, neck pain and neck stiffness.  Skin: Negative for itching and rash.  Neurological: Negative for dizziness, extremity weakness, gait problem, headaches, light-headedness and seizures.  Hematological: Negative for adenopathy. Does not bruise/bleed easily.  Psychiatric/Behavioral: Negative for  confusion, depression and  sleep disturbance. The patient is not nervous/anxious.     PHYSICAL EXAMINATION:  Blood pressure (!) 140/57, pulse 79, temperature 97.9 F (36.6 C), temperature source Temporal, resp. rate 18, height 5' 7 (1.702 m), weight 254 lb (115.2 kg), SpO2 100%.  ECOG PERFORMANCE STATUS: 1  Physical Exam  Constitutional: Oriented to person, place, and time and well-developed, well-nourished, and in no distress. HENT:  Head: Normocephalic and atraumatic.  Mouth/Throat: Oropharynx is clear and moist. No oropharyngeal exudate.  Eyes: Conjunctivae are normal. Right eye exhibits no discharge. Left eye exhibits no discharge. No scleral icterus.  Neck: Normal range of motion. Neck supple.  Cardiovascular: Normal rate, regular rhythm, normal heart sounds and intact distal pulses.   Pulmonary/Chest: Effort normal and breath sounds normal. No respiratory distress. No wheezes. No rales.  Abdominal: Soft. Bowel sounds are normal. Exhibits no distension and no mass. There is no tenderness.  Musculoskeletal: Normal range of motion. Exhibits no edema.  Lymphadenopathy:    No cervical adenopathy.  Neurological: Alert and oriented to person, place, and time. Exhibits normal muscle tone. Gait normal. Coordination normal.  Skin: Skin is warm and dry. No rash noted. Not diaphoretic. No erythema. No pallor.  Psychiatric: Mood, memory and judgment normal.  Vitals reviewed.  LABORATORY DATA: Lab Results  Component Value Date   WBC 2.3 (L) 01/13/2024   HGB 9.3 (L) 01/13/2024   HCT 30.9 (L) 01/13/2024   MCV 77.8 (L) 01/13/2024   PLT 80 (L) 01/13/2024      Chemistry      Component Value Date/Time   NA 139 01/13/2024 0849   NA 138 11/14/2016 0824   K 4.1 01/13/2024 0849   K 4.3 11/14/2016 0824   CL 105 01/13/2024 0849   CL 104 04/07/2012 1415   CO2 23 01/13/2024 0849   CO2 23 11/14/2016 0824   BUN 9 01/13/2024 0849   BUN 9.1 11/14/2016 0824   CREATININE 0.85 01/13/2024 0849    CREATININE 0.7 11/14/2016 0824      Component Value Date/Time   CALCIUM 8.9 01/13/2024 0849   CALCIUM 9.6 11/14/2016 0824   ALKPHOS 269 (H) 01/13/2024 0849   ALKPHOS 94 11/14/2016 0824   AST 50 (H) 01/13/2024 0849   AST 28 11/14/2016 0824   ALT 26 01/13/2024 0849   ALT 24 11/14/2016 0824   BILITOT 0.5 01/13/2024 0849   BILITOT 0.41 11/14/2016 0824       RADIOGRAPHIC STUDIES:  No results found.   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      The patient previously underwent 107 cycles of subcutaneous weekly Velcade , as well as Revlimid  and Decadron  on and off over the last several years.   She had been off treatment since June 2021 until July 2022 in which she had evidence of disease progression.  Therefore, she was restarted on Velcade , Revlimid , and Decadron .  She has restarted treatment in July 2022 with cycle 107 of weekly velcade . She tolerated well without any concerning adverse side effects.  She was then given a break.   She was seen recently by Dr. Nichole at [facility] for second opinion and after extensive investigation is recommended for her to resume her treatment for the multiple myeloma with daratumumab , Pomalyst  3 Mg p.o. daily for 21 days every 4 weeks and Decadron  20 mg weekly. This was started in September 2024. This was discontinued due to disease progression in December 2024.  She saw multiple myeloma expert, Dr. Ariel recently who recommended Selinexor  (oral), carfilzomib  (infusion), and Decadron  was recommended. She started this on 02/25/23.    s/p cilta-cel 08/21/23 anti-BCMA CAR T    Labs were reviewed. She will proceed with IVIG today.   Her myeloma labs are pending. Her CMP is pending.   We will see her for labs and follow up with the next IVIG in 4 weeks.   He will resume her iron supplement.  She will contact gastroenterology regarding  her liver disease and anemia.  Anxiety disorder Ongoing anxiety and panic attacks with dyspnea, uses Xanax  effectively, dyspnea likely due to anxiety and anemia. - Reviewed current use of Xanax  for anxiety and panic attacks. - Discussed multifactorial etiology of dyspnea (anemia and anxiety).   The patient was advised to call immediately if she has any concerning symptoms in the interval. The patient voices understanding of current disease status and treatment options and is in agreement with the current care plan. All questions were answered. The patient knows to call the clinic with any problems, questions or concerns. We can certainly see the patient much sooner if necessary      No orders of the defined types were placed in this encounter.    The total time spent in the appointment was 20-29 minutes  Brenda Pretty L Quamaine Webb, PA-C 01/13/2024

## 2024-01-12 ENCOUNTER — Other Ambulatory Visit: Payer: Self-pay | Admitting: Physician Assistant

## 2024-01-12 DIAGNOSIS — C9000 Multiple myeloma not having achieved remission: Secondary | ICD-10-CM

## 2024-01-13 ENCOUNTER — Inpatient Hospital Stay (HOSPITAL_BASED_OUTPATIENT_CLINIC_OR_DEPARTMENT_OTHER): Admitting: Physician Assistant

## 2024-01-13 ENCOUNTER — Inpatient Hospital Stay

## 2024-01-13 ENCOUNTER — Inpatient Hospital Stay: Attending: Internal Medicine

## 2024-01-13 VITALS — BP 129/61 | HR 63 | Temp 98.3°F | Resp 16

## 2024-01-13 VITALS — BP 140/57 | HR 79 | Temp 97.9°F | Resp 18 | Ht 67.0 in | Wt 254.0 lb

## 2024-01-13 DIAGNOSIS — K769 Liver disease, unspecified: Secondary | ICD-10-CM | POA: Diagnosis not present

## 2024-01-13 DIAGNOSIS — Z880 Allergy status to penicillin: Secondary | ICD-10-CM | POA: Insufficient documentation

## 2024-01-13 DIAGNOSIS — F410 Panic disorder [episodic paroxysmal anxiety] without agoraphobia: Secondary | ICD-10-CM | POA: Diagnosis not present

## 2024-01-13 DIAGNOSIS — Z9049 Acquired absence of other specified parts of digestive tract: Secondary | ICD-10-CM | POA: Diagnosis not present

## 2024-01-13 DIAGNOSIS — K529 Noninfective gastroenteritis and colitis, unspecified: Secondary | ICD-10-CM | POA: Insufficient documentation

## 2024-01-13 DIAGNOSIS — Z79899 Other long term (current) drug therapy: Secondary | ICD-10-CM | POA: Insufficient documentation

## 2024-01-13 DIAGNOSIS — Z885 Allergy status to narcotic agent status: Secondary | ICD-10-CM | POA: Insufficient documentation

## 2024-01-13 DIAGNOSIS — Z7969 Long term (current) use of other immunomodulators and immunosuppressants: Secondary | ICD-10-CM | POA: Diagnosis not present

## 2024-01-13 DIAGNOSIS — Z9842 Cataract extraction status, left eye: Secondary | ICD-10-CM | POA: Insufficient documentation

## 2024-01-13 DIAGNOSIS — Z7982 Long term (current) use of aspirin: Secondary | ICD-10-CM | POA: Diagnosis not present

## 2024-01-13 DIAGNOSIS — Z9841 Cataract extraction status, right eye: Secondary | ICD-10-CM | POA: Diagnosis not present

## 2024-01-13 DIAGNOSIS — C9000 Multiple myeloma not having achieved remission: Secondary | ICD-10-CM

## 2024-01-13 DIAGNOSIS — Z79624 Long term (current) use of inhibitors of nucleotide synthesis: Secondary | ICD-10-CM | POA: Diagnosis not present

## 2024-01-13 DIAGNOSIS — Z7952 Long term (current) use of systemic steroids: Secondary | ICD-10-CM | POA: Insufficient documentation

## 2024-01-13 DIAGNOSIS — J45909 Unspecified asthma, uncomplicated: Secondary | ICD-10-CM | POA: Diagnosis not present

## 2024-01-13 DIAGNOSIS — T451X5A Adverse effect of antineoplastic and immunosuppressive drugs, initial encounter: Secondary | ICD-10-CM

## 2024-01-13 DIAGNOSIS — Z86018 Personal history of other benign neoplasm: Secondary | ICD-10-CM | POA: Insufficient documentation

## 2024-01-13 DIAGNOSIS — D649 Anemia, unspecified: Secondary | ICD-10-CM | POA: Insufficient documentation

## 2024-01-13 DIAGNOSIS — Z7961 Long term (current) use of immunomodulator: Secondary | ICD-10-CM | POA: Insufficient documentation

## 2024-01-13 DIAGNOSIS — F32A Depression, unspecified: Secondary | ICD-10-CM | POA: Diagnosis not present

## 2024-01-13 DIAGNOSIS — Z95828 Presence of other vascular implants and grafts: Secondary | ICD-10-CM

## 2024-01-13 LAB — SAMPLE TO BLOOD BANK

## 2024-01-13 LAB — CMP (CANCER CENTER ONLY)
ALT: 26 U/L (ref 0–44)
AST: 50 U/L — ABNORMAL HIGH (ref 15–41)
Albumin: 4.4 g/dL (ref 3.5–5.0)
Alkaline Phosphatase: 269 U/L — ABNORMAL HIGH (ref 38–126)
Anion gap: 12 (ref 5–15)
BUN: 9 mg/dL (ref 8–23)
CO2: 23 mmol/L (ref 22–32)
Calcium: 8.9 mg/dL (ref 8.9–10.3)
Chloride: 105 mmol/L (ref 98–111)
Creatinine: 0.85 mg/dL (ref 0.44–1.00)
GFR, Estimated: >60 mL/min
Glucose, Bld: 110 mg/dL — ABNORMAL HIGH (ref 70–99)
Potassium: 4.1 mmol/L (ref 3.5–5.1)
Sodium: 139 mmol/L (ref 135–145)
Total Bilirubin: 0.5 mg/dL (ref 0.0–1.2)
Total Protein: 6.9 g/dL (ref 6.5–8.1)

## 2024-01-13 LAB — FERRITIN: Ferritin: 22 ng/mL (ref 11–307)

## 2024-01-13 LAB — CBC WITH DIFFERENTIAL (CANCER CENTER ONLY)
Abs Immature Granulocytes: 0 K/uL (ref 0.00–0.07)
Basophils Absolute: 0 K/uL (ref 0.0–0.1)
Basophils Relative: 1 %
Eosinophils Absolute: 0 K/uL (ref 0.0–0.5)
Eosinophils Relative: 1 %
HCT: 30.9 % — ABNORMAL LOW (ref 36.0–46.0)
Hemoglobin: 9.3 g/dL — ABNORMAL LOW (ref 12.0–15.0)
Immature Granulocytes: 0 %
Lymphocytes Relative: 9 %
Lymphs Abs: 0.2 K/uL — ABNORMAL LOW (ref 0.7–4.0)
MCH: 23.4 pg — ABNORMAL LOW (ref 26.0–34.0)
MCHC: 30.1 g/dL (ref 30.0–36.0)
MCV: 77.8 fL — ABNORMAL LOW (ref 80.0–100.0)
Monocytes Absolute: 0.2 K/uL (ref 0.1–1.0)
Monocytes Relative: 8 %
Neutro Abs: 1.9 K/uL (ref 1.7–7.7)
Neutrophils Relative %: 81 %
Platelet Count: 80 K/uL — ABNORMAL LOW (ref 150–400)
RBC: 3.97 MIL/uL (ref 3.87–5.11)
RDW: 17 % — ABNORMAL HIGH (ref 11.5–15.5)
WBC Count: 2.3 K/uL — ABNORMAL LOW (ref 4.0–10.5)
nRBC: 0 % (ref 0.0–0.2)

## 2024-01-13 LAB — FIBRINOGEN: Fibrinogen: 405 mg/dL (ref 210–475)

## 2024-01-13 LAB — C-REACTIVE PROTEIN: CRP: <0.5 mg/dL

## 2024-01-13 LAB — LACTATE DEHYDROGENASE: LDH: 155 U/L (ref 105–235)

## 2024-01-13 MED ORDER — DIPHENHYDRAMINE HCL 25 MG PO CAPS
25.0000 mg | ORAL_CAPSULE | Freq: Once | ORAL | Status: AC
  Administered 2024-01-13: 25 mg via ORAL
  Filled 2024-01-13: qty 1

## 2024-01-13 MED ORDER — ACETAMINOPHEN 325 MG PO TABS
650.0000 mg | ORAL_TABLET | Freq: Once | ORAL | Status: AC
  Administered 2024-01-13: 650 mg via ORAL
  Filled 2024-01-13: qty 2

## 2024-01-13 MED ORDER — IMMUNE GLOBULIN (HUMAN) 10 GM/100ML IV SOLN
400.0000 mg/kg | Freq: Once | INTRAVENOUS | Status: AC
  Administered 2024-01-13: 45 g via INTRAVENOUS
  Filled 2024-01-13: qty 450

## 2024-01-13 MED ORDER — DEXTROSE 5 % IV SOLN: INTRAVENOUS | Status: DC

## 2024-01-13 NOTE — Patient Instructions (Signed)
 Immune Globulin  Injection What is this medication? IMMUNE GLOBULIN  (im MUNE GLOB yoo lin) treats many immune system conditions. It works by Designer, multimedia extra antibodies. Antibodies are proteins made by the immune system that help protect the body. This medicine may be used for other purposes; ask your health care provider or pharmacist if you have questions. COMMON BRAND NAME(S): ASCENIV, Baygam, BIVIGAM, Carimune, Carimune NF, cutaquig, Cuvitru, Flebogamma, Flebogamma DIF, GamaSTAN, GamaSTAN S/D, Gamimune N, Gammagard, Gammagard S/D, Gammaked, Gammaplex, Gammar-P IV, Gamunex, Gamunex-C, Hizentra, Iveegam, Iveegam EN, Octagam, Panglobulin, Panglobulin NF, panzyga, Polygam S/D, Privigen , Sandoglobulin, Venoglobulin-S, Vigam, Vivaglobulin, Xembify What should I tell my care team before I take this medication? They need to know if you have any of these conditions: Blood clotting disorder Condition where you have excess fluid in your body, such as heart failure or edema Dehydration Diabetes Have had blood clots Heart disease Immune system conditions Kidney disease Low levels of IgA Recent or upcoming vaccine An unusual or allergic reaction to immune globulin , other medications, foods, dyes, or preservatives Pregnant or trying to get pregnant Breastfeeding How should I use this medication? This medication is infused into a vein or under the skin. It may also be injected into a muscle. It is usually given by your care team in a hospital or clinic setting. It may also be given at home. If you get this medication at home, you will be taught how to prepare and give it. Take it as directed on the prescription label. Keep taking it unless your care team tells you to stop. It is important that you put your used needles and syringes in a special sharps container. Do not put them in a trash can. If you do not have a sharps container, call your pharmacist or care team to get one. Talk to your care team  about the use of this medication in children. While it may be given to children for selected conditions, precautions do apply. Overdosage: If you think you have taken too much of this medicine contact a poison control center or emergency room at once. NOTE: This medicine is only for you. Do not share this medicine with others. What if I miss a dose? If you get this medication at the hospital or clinic: It is important not to miss your dose. Call your care team if you are unable to keep an appointment. If you give yourself this medication at home: If you miss a dose, take it as soon as you can. Then continue your normal schedule. If it is almost time for your next dose, take only that dose. Do not take double or extra doses. Call your care team with questions. What may interact with this medication? Live virus vaccines This list may not describe all possible interactions. Give your health care provider a list of all the medicines, herbs, non-prescription drugs, or dietary supplements you use. Also tell them if you smoke, drink alcohol , or use illegal drugs. Some items may interact with your medicine. What should I watch for while using this medication? Your condition will be monitored carefully while you are receiving this medication. Tell your care team if your symptoms do not start to get better or if they get worse. You may need blood work done while you are taking this medication. This medication increases the risk of blood clots. People with heart, blood vessel, or blood clotting conditions are more likely to develop a blood clot. Other risk factors include advanced age, estrogen use, tobacco  use, lack of movement, and being overweight. This medication can decrease the response to a vaccine. If you need to get vaccinated, tell your care team if you have received this medication within the last year. Extra booster doses may be needed. Talk to your care team to see if a different vaccination schedule  is needed. This medication is made from donated human blood. There is a small risk it may contain bacteria or viruses, such as hepatitis or HIV. All products are processed to kill most bacteria and viruses. Talk to your care team if you have questions about the risk of infection. If you have diabetes, talk to your care team about which device you should use to check your blood sugar. This medication may cause some devices to report falsely high blood sugar levels. This may cause you to react by not treating a low blood sugar level or by giving an insulin  dose that was not needed. This can cause severe low blood sugar levels. What side effects may I notice from receiving this medication? Side effects that you should report to your care team as soon as possible: Allergic reactions--skin rash, itching, hives, swelling of the face, lips, tongue, or throat Blood clot--pain, swelling, or warmth in the leg, shortness of breath, chest pain Fever, neck pain or stiffness, sensitivity to light, headache, nausea, vomiting, confusion, which may be signs of meningitis Hemolytic anemia--unusual weakness or fatigue, dizziness, headache, trouble breathing, dark urine, yellowing skin or eyes Kidney injury--decrease in the amount of urine, swelling of the ankles, hands, or feet Low sodium level--muscle weakness, fatigue, dizziness, headache, confusion Shortness of breath or trouble breathing, cough, unusual weakness or fatigue, blue skin or lips Side effects that usually do not require medical attention (report these to your care team if they continue or are bothersome): Chills Diarrhea Fever Headache Nausea This list may not describe all possible side effects. Call your doctor for medical advice about side effects. You may report side effects to FDA at 1-800-FDA-1088. Where should I keep my medication? Keep out of the reach of children and pets. You will be instructed on how to store this medication. Get rid of  any unused medication after the expiration date. To get rid of medications that are no longer needed or have expired: Take the medication to a medication take-back program. Check with your pharmacy or law enforcement to find a location. If you cannot return the medication, ask your pharmacist or care team how to get rid of this medication safely. NOTE: This sheet is a summary. It may not cover all possible information. If you have questions about this medicine, talk to your doctor, pharmacist, or health care provider.  2025 Elsevier/Gold Standard (2023-03-11 00:00:00)

## 2024-01-14 ENCOUNTER — Encounter (HOSPITAL_BASED_OUTPATIENT_CLINIC_OR_DEPARTMENT_OTHER): Payer: Self-pay | Admitting: Obstetrics & Gynecology

## 2024-01-14 ENCOUNTER — Other Ambulatory Visit (HOSPITAL_BASED_OUTPATIENT_CLINIC_OR_DEPARTMENT_OTHER): Payer: Self-pay

## 2024-01-14 ENCOUNTER — Telehealth: Payer: Self-pay | Admitting: Physician Assistant

## 2024-01-14 DIAGNOSIS — F419 Anxiety disorder, unspecified: Secondary | ICD-10-CM

## 2024-01-14 LAB — KAPPA/LAMBDA LIGHT CHAINS
Kappa free light chain: 3.9 mg/L (ref 3.3–19.4)
Kappa, lambda light chain ratio: 1.44 (ref 0.26–1.65)
Lambda free light chains: 2.7 mg/L — ABNORMAL LOW (ref 5.7–26.3)

## 2024-01-14 LAB — BETA 2 MICROGLOBULIN, SERUM: Beta-2 Microglobulin: 2.2 mg/L (ref 0.6–2.4)

## 2024-01-14 MED ORDER — SERTRALINE HCL 50 MG PO TABS: 150.0000 mg | ORAL_TABLET | Freq: Every day | ORAL | 1 refills | Status: AC

## 2024-01-14 NOTE — Telephone Encounter (Signed)
 Called the pt and informed them of their upcoming appts on feb and march

## 2024-01-15 LAB — MULTIPLE MYELOMA PANEL, SERUM
Albumin SerPl Elph-Mcnc: 3.5 g/dL (ref 2.9–4.4)
Albumin/Glob SerPl: 1.3 (ref 0.7–1.7)
Alpha 1: 0.3 g/dL (ref 0.0–0.4)
Alpha2 Glob SerPl Elph-Mcnc: 0.8 g/dL (ref 0.4–1.0)
B-Globulin SerPl Elph-Mcnc: 1.1 g/dL (ref 0.7–1.3)
Gamma Glob SerPl Elph-Mcnc: 0.7 g/dL (ref 0.4–1.8)
Globulin, Total: 2.9 g/dL (ref 2.2–3.9)
IgA: <5 mg/dL — ABNORMAL LOW (ref 87–352)
IgG (Immunoglobin G), Serum: 667 mg/dL (ref 586–1602)
IgM (Immunoglobulin M), Srm: 41 mg/dL (ref 26–217)
Total Protein ELP: 6.4 g/dL (ref 6.0–8.5)

## 2024-01-17 ENCOUNTER — Other Ambulatory Visit (HOSPITAL_BASED_OUTPATIENT_CLINIC_OR_DEPARTMENT_OTHER): Payer: Self-pay | Admitting: Obstetrics & Gynecology

## 2024-01-17 DIAGNOSIS — F419 Anxiety disorder, unspecified: Secondary | ICD-10-CM

## 2024-01-17 MED ORDER — ALPRAZOLAM 0.5 MG PO TABS: 0.5000 mg | ORAL_TABLET | Freq: Every evening | ORAL | 1 refills | Status: AC | PRN

## 2024-02-05 ENCOUNTER — Encounter: Payer: Self-pay | Admitting: Internal Medicine

## 2024-02-05 ENCOUNTER — Other Ambulatory Visit: Payer: Self-pay | Admitting: Physician Assistant

## 2024-02-07 ENCOUNTER — Telehealth: Payer: Self-pay | Admitting: Physician Assistant

## 2024-02-07 NOTE — Telephone Encounter (Signed)
 Left a vm of the pts infusion appt and told them they will draw blood in infusion as well as that cassie will see them there as well

## 2024-02-09 ENCOUNTER — Telehealth: Payer: Self-pay | Admitting: Physician Assistant

## 2024-02-09 NOTE — Telephone Encounter (Signed)
 Rescheduled appointments due to the cancer center being closed on Monday 2/2. Talked with the patient and she is aware of the changes made to her upcoming appointments.

## 2024-02-10 ENCOUNTER — Inpatient Hospital Stay

## 2024-02-10 ENCOUNTER — Inpatient Hospital Stay: Attending: Internal Medicine

## 2024-02-10 ENCOUNTER — Inpatient Hospital Stay: Attending: Internal Medicine | Admitting: Physician Assistant

## 2024-02-10 DIAGNOSIS — C9000 Multiple myeloma not having achieved remission: Secondary | ICD-10-CM | POA: Diagnosis not present

## 2024-02-10 MED ORDER — ACYCLOVIR 400 MG PO TABS: 400.0000 mg | ORAL_TABLET | Freq: Two times a day (BID) | ORAL | 2 refills | Status: AC

## 2024-02-14 ENCOUNTER — Inpatient Hospital Stay

## 2024-02-14 VITALS — BP 121/63 | HR 60 | Temp 98.0°F | Resp 16 | Wt 255.5 lb

## 2024-02-14 DIAGNOSIS — T451X5A Adverse effect of antineoplastic and immunosuppressive drugs, initial encounter: Secondary | ICD-10-CM

## 2024-02-14 DIAGNOSIS — C9000 Multiple myeloma not having achieved remission: Secondary | ICD-10-CM

## 2024-02-14 DIAGNOSIS — Z95828 Presence of other vascular implants and grafts: Secondary | ICD-10-CM

## 2024-02-14 LAB — SAMPLE TO BLOOD BANK

## 2024-02-14 LAB — CBC WITH DIFFERENTIAL (CANCER CENTER ONLY)
Abs Immature Granulocytes: 0.01 10*3/uL (ref 0.00–0.07)
Basophils Absolute: 0 10*3/uL (ref 0.0–0.1)
Basophils Relative: 1 %
Eosinophils Absolute: 0 10*3/uL (ref 0.0–0.5)
Eosinophils Relative: 1 %
HCT: 32 % — ABNORMAL LOW (ref 36.0–46.0)
Hemoglobin: 9.7 g/dL — ABNORMAL LOW (ref 12.0–15.0)
Immature Granulocytes: 1 %
Lymphocytes Relative: 12 %
Lymphs Abs: 0.2 10*3/uL — ABNORMAL LOW (ref 0.7–4.0)
MCH: 24.5 pg — ABNORMAL LOW (ref 26.0–34.0)
MCHC: 30.3 g/dL (ref 30.0–36.0)
MCV: 80.8 fL (ref 80.0–100.0)
Monocytes Absolute: 0.2 10*3/uL (ref 0.1–1.0)
Monocytes Relative: 11 %
Neutro Abs: 1.4 10*3/uL — ABNORMAL LOW (ref 1.7–7.7)
Neutrophils Relative %: 74 %
Platelet Count: 90 10*3/uL — ABNORMAL LOW (ref 150–400)
RBC: 3.96 MIL/uL (ref 3.87–5.11)
RDW: 20.3 % — ABNORMAL HIGH (ref 11.5–15.5)
WBC Count: 1.9 10*3/uL — ABNORMAL LOW (ref 4.0–10.5)
nRBC: 0 % (ref 0.0–0.2)

## 2024-02-14 LAB — CMP (CANCER CENTER ONLY)
ALT: 33 U/L (ref 0–44)
AST: 55 U/L — ABNORMAL HIGH (ref 15–41)
Albumin: 4.4 g/dL (ref 3.5–5.0)
Alkaline Phosphatase: 245 U/L — ABNORMAL HIGH (ref 38–126)
Anion gap: 12 (ref 5–15)
BUN: 8 mg/dL (ref 8–23)
CO2: 25 mmol/L (ref 22–32)
Calcium: 9.4 mg/dL (ref 8.9–10.3)
Chloride: 101 mmol/L (ref 98–111)
Creatinine: 0.88 mg/dL (ref 0.44–1.00)
GFR, Estimated: >60 mL/min
Glucose, Bld: 122 mg/dL — ABNORMAL HIGH (ref 70–99)
Potassium: 4.4 mmol/L (ref 3.5–5.1)
Sodium: 138 mmol/L (ref 135–145)
Total Bilirubin: 0.5 mg/dL (ref 0.0–1.2)
Total Protein: 6.9 g/dL (ref 6.5–8.1)

## 2024-02-14 LAB — IRON AND IRON BINDING CAPACITY (CC-WL,HP ONLY)
Iron: 51 ug/dL (ref 28–170)
Saturation Ratios: 9 % — ABNORMAL LOW (ref 10.4–31.8)
TIBC: 582 ug/dL — ABNORMAL HIGH (ref 250–450)
UIBC: 532 ug/dL

## 2024-02-14 LAB — MAGNESIUM: Magnesium: 1.8 mg/dL (ref 1.7–2.4)

## 2024-02-14 LAB — C-REACTIVE PROTEIN: CRP: <0.5 mg/dL

## 2024-02-14 LAB — FERRITIN: Ferritin: 40 ng/mL (ref 11–307)

## 2024-02-14 MED ORDER — ACETAMINOPHEN 325 MG PO TABS
650.0000 mg | ORAL_TABLET | Freq: Once | ORAL | Status: AC
  Administered 2024-02-14: 650 mg via ORAL
  Filled 2024-02-14: qty 2

## 2024-02-14 MED ORDER — DEXTROSE 5 % IV SOLN: INTRAVENOUS | Status: DC

## 2024-02-14 MED ORDER — DIPHENHYDRAMINE HCL 25 MG PO CAPS
25.0000 mg | ORAL_CAPSULE | Freq: Once | ORAL | Status: AC
  Administered 2024-02-14: 25 mg via ORAL
  Filled 2024-02-14: qty 1

## 2024-02-14 MED ORDER — IMMUNE GLOBULIN (HUMAN) 10 GM/100ML IV SOLN
400.0000 mg/kg | Freq: Once | INTRAVENOUS | Status: AC
  Administered 2024-02-14: 45 g via INTRAVENOUS
  Filled 2024-02-14: qty 400

## 2024-02-14 NOTE — Patient Instructions (Signed)
 Low IgA in the Body (Selective Immunoglobulin A Deficiency): What to Know Selective immunoglobulin A (IgA) deficiency means that you do not have enough IgA, or you do not have any IgA, in your body. This condition can happen at any age. IgA is found in your blood. It can also be found in your lungs, sinuses, digestive tract, and skin. It is a type of substance (antibody) that is part of your body's disease-fighting system (immune system). IgA is the most common antibody to lack. What are the causes? In most cases, the cause of this condition is not known. But some causes may include: A problem with a gene passed down from a parent (inherited). Viral infections. These include rubella and cytomegalovirus (CMV). Some medicines. These include aspirin and ibuprofen. They also include medicines used to treat arthritis, high blood pressure, seizures, thyroid  disease, and malaria. What increases the risk? You are more likely to have an IgA deficiency if: You take a medicine that can cause this condition. You have a family history of this condition. What are the signs or symptoms? On its own, this condition may not cause any symptoms. But it can cause you to get more or long-lasting (chronic) infections that may affect: Your lungs (pneumonia). Your nose and throat (upper respiratory infections). Your ears. Your sinuses (sinusitis). Your eyes. Your skin. You may also be more likely to: Get an autoimmune disease. This type of disease causes your immune system to attack your body. It can cause inflammation, swollen joints, skin rash, diarrhea, and pain in the abdomen. Have allergies. An allergy may cause a stuffy nose (nasal congestion), growths within the lining of your nose (polyps), and asthma. Get a chronic condition that affects your stomach and intestines. Have a type of severe allergic reaction (anaphylactic reaction) if you receive blood that has IgA antibodies in it. This may also happen if you  get a shot of antibodies. This is rare. It may cause swelling, trouble breathing, and very low blood pressure. How is this diagnosed?  This condition is diagnosed based on your symptoms, your medical history, and a physical exam. You may also have other tests. These may include: Blood tests. These may be done to check your antibody levels. They may also be done to look for signs of infection. Imaging studies. These may be done of your lungs or sinuses. How is this treated? Treatment may include: Taking antibiotics. Stopping the use of any medicines that may be causing the problem. Treating your allergies. Treating any autoimmune disease. If your condition is not causing any diseases, allergies, or infections, you may not need treatment. Follow these instructions at home: Medicines Take over-the-counter and prescription medicines only as told by your health care provider. If you were prescribed antibiotics, take them as told by your provider. Do not stop using the antibiotic even if you start to feel better. Eating and drinking Ask your provider if you should follow a certain diet. Do not drink water from a source that may be unclean. Unclean water can cause an infection. General instructions Wear a medical ID bracelet to let providers know that you may have a severe allergic reaction to blood with IgA antibodies in it. Do not use any products that contain nicotine or tobacco. These products include cigarettes, chewing tobacco, and vaping devices, such as e-cigarettes. If you need help quitting, ask your provider. Where to find more information American Academy of Allergy, Asthma and Immunology: aaaai.org Contact a health care provider if: You have a  fever. Your congestion gets worse. You get a cough or sore throat. You have diarrhea. You have hives. These are itchy, red, swollen areas of skin. Get help right away if: You have a severe allergic reaction. You make high-pitched  whistling sounds when you breathe, most often when you breathe out (wheeze). You have shortness of breath. These symptoms may be an emergency. Get help right away. Call 911. Do not wait to see if the symptoms will go away. Do not drive yourself to the hospital. This information is not intended to replace advice given to you by your health care provider. Make sure you discuss any questions you have with your health care provider. Document Revised: 11/07/2023 Document Reviewed: 09/05/2021 Elsevier Patient Education  2025 [employer].

## 2024-02-17 ENCOUNTER — Ambulatory Visit: Admitting: Gastroenterology

## 2024-03-09 ENCOUNTER — Inpatient Hospital Stay

## 2024-03-09 ENCOUNTER — Inpatient Hospital Stay: Admitting: Physician Assistant

## 2024-03-09 ENCOUNTER — Inpatient Hospital Stay: Attending: Internal Medicine

## 2024-03-16 ENCOUNTER — Inpatient Hospital Stay

## 2024-03-16 ENCOUNTER — Inpatient Hospital Stay: Attending: Internal Medicine

## 2024-03-16 ENCOUNTER — Inpatient Hospital Stay: Admitting: Physician Assistant

## 2024-04-13 ENCOUNTER — Inpatient Hospital Stay

## 2024-04-13 ENCOUNTER — Inpatient Hospital Stay: Attending: Internal Medicine

## 2024-04-13 ENCOUNTER — Inpatient Hospital Stay: Admitting: Internal Medicine

## 2024-05-11 ENCOUNTER — Ambulatory Visit (HOSPITAL_BASED_OUTPATIENT_CLINIC_OR_DEPARTMENT_OTHER): Admitting: Obstetrics & Gynecology
